# Patient Record
Sex: Male | Born: 1950 | Race: Black or African American | Hispanic: No | Marital: Married | Smoking: Former smoker
Health system: Southern US, Community
[De-identification: ages and names within clinical notes are randomized; demographics above are authoritative.]

## PROBLEM LIST (undated history)

## (undated) DIAGNOSIS — M25561 Pain in right knee: Secondary | ICD-10-CM

## (undated) DIAGNOSIS — M79674 Pain in right toe(s): Secondary | ICD-10-CM

## (undated) DIAGNOSIS — I509 Heart failure, unspecified: Secondary | ICD-10-CM

## (undated) DIAGNOSIS — I10 Essential (primary) hypertension: Secondary | ICD-10-CM

## (undated) DIAGNOSIS — Z87442 Personal history of urinary calculi: Secondary | ICD-10-CM

## (undated) DIAGNOSIS — M109 Gout, unspecified: Secondary | ICD-10-CM

## (undated) DIAGNOSIS — R5381 Other malaise: Secondary | ICD-10-CM

## (undated) DIAGNOSIS — D649 Anemia, unspecified: Secondary | ICD-10-CM

## (undated) DIAGNOSIS — E785 Hyperlipidemia, unspecified: Secondary | ICD-10-CM

## (undated) DIAGNOSIS — E669 Obesity, unspecified: Secondary | ICD-10-CM

## (undated) DIAGNOSIS — I4892 Unspecified atrial flutter: Secondary | ICD-10-CM

## (undated) DIAGNOSIS — M25562 Pain in left knee: Secondary | ICD-10-CM

## (undated) DIAGNOSIS — R011 Cardiac murmur, unspecified: Secondary | ICD-10-CM

## (undated) DIAGNOSIS — N189 Chronic kidney disease, unspecified: Secondary | ICD-10-CM

## (undated) DIAGNOSIS — Z9189 Other specified personal risk factors, not elsewhere classified: Secondary | ICD-10-CM

## (undated) DIAGNOSIS — F528 Other sexual dysfunction not due to a substance or known physiological condition: Secondary | ICD-10-CM

## (undated) DIAGNOSIS — R0789 Other chest pain: Secondary | ICD-10-CM

## (undated) DIAGNOSIS — R609 Edema, unspecified: Secondary | ICD-10-CM

## (undated) DIAGNOSIS — E875 Hyperkalemia: Secondary | ICD-10-CM

## (undated) DIAGNOSIS — R0902 Hypoxemia: Secondary | ICD-10-CM

## (undated) DIAGNOSIS — Z9989 Dependence on other enabling machines and devices: Secondary | ICD-10-CM

## (undated) DIAGNOSIS — G609 Hereditary and idiopathic neuropathy, unspecified: Secondary | ICD-10-CM

## (undated) DIAGNOSIS — I4891 Unspecified atrial fibrillation: Secondary | ICD-10-CM

## (undated) DIAGNOSIS — D219 Benign neoplasm of connective and other soft tissue, unspecified: Secondary | ICD-10-CM

## (undated) DIAGNOSIS — R5383 Other fatigue: Secondary | ICD-10-CM

## (undated) DIAGNOSIS — G4733 Obstructive sleep apnea (adult) (pediatric): Secondary | ICD-10-CM

## (undated) DIAGNOSIS — G4730 Sleep apnea, unspecified: Secondary | ICD-10-CM

## (undated) DIAGNOSIS — M1990 Unspecified osteoarthritis, unspecified site: Secondary | ICD-10-CM

## (undated) DIAGNOSIS — I8002 Phlebitis and thrombophlebitis of superficial vessels of left lower extremity: Secondary | ICD-10-CM

## (undated) DIAGNOSIS — R0602 Shortness of breath: Secondary | ICD-10-CM

## (undated) DIAGNOSIS — E119 Type 2 diabetes mellitus without complications: Secondary | ICD-10-CM

## (undated) DIAGNOSIS — R942 Abnormal results of pulmonary function studies: Secondary | ICD-10-CM

## (undated) DIAGNOSIS — R079 Chest pain, unspecified: Secondary | ICD-10-CM

## (undated) DIAGNOSIS — I359 Nonrheumatic aortic valve disorder, unspecified: Secondary | ICD-10-CM

## (undated) DIAGNOSIS — G4700 Insomnia, unspecified: Secondary | ICD-10-CM

## (undated) DIAGNOSIS — Z87898 Personal history of other specified conditions: Secondary | ICD-10-CM

## (undated) DIAGNOSIS — M26609 Unspecified temporomandibular joint disorder, unspecified side: Secondary | ICD-10-CM

## (undated) DIAGNOSIS — E291 Testicular hypofunction: Secondary | ICD-10-CM

## (undated) DIAGNOSIS — R197 Diarrhea, unspecified: Secondary | ICD-10-CM

## (undated) HISTORY — DX: Pain in right knee: M25.561

## (undated) HISTORY — DX: Unspecified atrial flutter: I48.92

## (undated) HISTORY — DX: Hereditary and idiopathic neuropathy, unspecified: G60.9

## (undated) HISTORY — DX: Obesity, unspecified: E66.9

## (undated) HISTORY — DX: Abnormal results of pulmonary function studies: R94.2

## (undated) HISTORY — DX: Other specified personal risk factors, not elsewhere classified: Z91.89

## (undated) HISTORY — DX: Other fatigue: R53.83

## (undated) HISTORY — DX: Personal history of other specified conditions: Z87.898

## (undated) HISTORY — DX: Anemia, unspecified: D64.9

## (undated) HISTORY — DX: Chest pain, unspecified: R07.9

## (undated) HISTORY — DX: Nonrheumatic aortic valve disorder, unspecified: I35.9

## (undated) HISTORY — DX: Other malaise: R53.81

## (undated) HISTORY — DX: Pain in right toe(s): M79.674

## (undated) HISTORY — DX: Testicular hypofunction: E29.1

## (undated) HISTORY — DX: Other sexual dysfunction not due to a substance or known physiological condition: F52.8

## (undated) HISTORY — PX: METATARSAL OSTEOTOMY: SHX1641

## (undated) HISTORY — DX: Unspecified atrial fibrillation: I48.91

## (undated) HISTORY — DX: Shortness of breath: R06.02

## (undated) HISTORY — DX: Insomnia, unspecified: G47.00

## (undated) HISTORY — DX: Hypoxemia: R09.02

## (undated) HISTORY — DX: Unspecified osteoarthritis, unspecified site: M19.90

## (undated) HISTORY — DX: Pain in left knee: M25.562

## (undated) HISTORY — DX: Gout, unspecified: M10.9

## (undated) HISTORY — DX: Heart failure, unspecified: I50.9

## (undated) HISTORY — DX: Benign neoplasm of connective and other soft tissue, unspecified: D21.9

## (undated) HISTORY — DX: Diarrhea, unspecified: R19.7

## (undated) HISTORY — DX: Phlebitis and thrombophlebitis of superficial vessels of left lower extremity: I80.02

## (undated) HISTORY — DX: Unspecified temporomandibular joint disorder, unspecified side: M26.609

## (undated) HISTORY — DX: Hyperkalemia: E87.5

## (undated) HISTORY — DX: Edema, unspecified: R60.9

## (undated) HISTORY — DX: Other chest pain: R07.89

## (undated) HISTORY — DX: Sleep apnea, unspecified: G47.30

## (undated) HISTORY — DX: Essential (primary) hypertension: I10

## (undated) HISTORY — DX: Chronic kidney disease, unspecified: N18.9

## (undated) HISTORY — DX: Hyperlipidemia, unspecified: E78.5

---

## 2005-02-10 ENCOUNTER — Ambulatory Visit: Payer: Self-pay | Admitting: Internal Medicine

## 2005-09-01 ENCOUNTER — Ambulatory Visit: Payer: Self-pay | Admitting: Internal Medicine

## 2005-09-15 ENCOUNTER — Ambulatory Visit: Payer: Self-pay | Admitting: Internal Medicine

## 2005-09-20 ENCOUNTER — Ambulatory Visit: Payer: Self-pay | Admitting: Internal Medicine

## 2005-11-25 ENCOUNTER — Ambulatory Visit: Payer: Self-pay | Admitting: Internal Medicine

## 2005-12-15 ENCOUNTER — Ambulatory Visit: Payer: Self-pay | Admitting: Internal Medicine

## 2005-12-30 ENCOUNTER — Encounter: Admission: RE | Admit: 2005-12-30 | Discharge: 2005-12-30 | Payer: Self-pay | Admitting: Internal Medicine

## 2007-01-25 ENCOUNTER — Encounter: Payer: Self-pay | Admitting: Internal Medicine

## 2007-01-25 DIAGNOSIS — I10 Essential (primary) hypertension: Secondary | ICD-10-CM

## 2007-01-25 DIAGNOSIS — Z9189 Other specified personal risk factors, not elsewhere classified: Secondary | ICD-10-CM

## 2007-01-25 DIAGNOSIS — E669 Obesity, unspecified: Secondary | ICD-10-CM

## 2007-01-25 DIAGNOSIS — F528 Other sexual dysfunction not due to a substance or known physiological condition: Secondary | ICD-10-CM

## 2007-01-25 HISTORY — DX: Obesity, unspecified: E66.9

## 2007-01-25 HISTORY — DX: Essential (primary) hypertension: I10

## 2007-01-25 HISTORY — DX: Other specified personal risk factors, not elsewhere classified: Z91.89

## 2007-01-25 HISTORY — DX: Other sexual dysfunction not due to a substance or known physiological condition: F52.8

## 2008-10-14 ENCOUNTER — Encounter: Payer: Self-pay | Admitting: Family Medicine

## 2008-10-21 ENCOUNTER — Emergency Department (HOSPITAL_COMMUNITY): Admission: EM | Admit: 2008-10-21 | Discharge: 2008-10-21 | Payer: Self-pay | Admitting: Family Medicine

## 2008-11-15 ENCOUNTER — Emergency Department (HOSPITAL_COMMUNITY): Admission: EM | Admit: 2008-11-15 | Discharge: 2008-11-15 | Payer: Self-pay | Admitting: Family Medicine

## 2008-11-20 ENCOUNTER — Encounter: Payer: Self-pay | Admitting: Family Medicine

## 2008-11-27 ENCOUNTER — Encounter: Payer: Self-pay | Admitting: Family Medicine

## 2009-07-10 ENCOUNTER — Encounter: Payer: Self-pay | Admitting: Family Medicine

## 2009-07-17 ENCOUNTER — Encounter: Payer: Self-pay | Admitting: Family Medicine

## 2009-10-07 ENCOUNTER — Encounter: Payer: Self-pay | Admitting: Family Medicine

## 2009-10-13 ENCOUNTER — Inpatient Hospital Stay (HOSPITAL_COMMUNITY): Admission: EM | Admit: 2009-10-13 | Discharge: 2009-10-21 | Payer: Self-pay | Admitting: Emergency Medicine

## 2009-10-13 ENCOUNTER — Encounter (INDEPENDENT_AMBULATORY_CARE_PROVIDER_SITE_OTHER): Payer: Self-pay | Admitting: Cardiovascular Disease

## 2009-10-13 ENCOUNTER — Ambulatory Visit: Payer: Self-pay | Admitting: Internal Medicine

## 2009-10-16 ENCOUNTER — Telehealth: Payer: Self-pay | Admitting: Internal Medicine

## 2009-10-16 HISTORY — PX: CARDIAC CATHETERIZATION: SHX172

## 2009-10-27 ENCOUNTER — Inpatient Hospital Stay (HOSPITAL_COMMUNITY): Admission: EM | Admit: 2009-10-27 | Discharge: 2009-10-28 | Payer: Self-pay | Admitting: Emergency Medicine

## 2009-11-11 ENCOUNTER — Ambulatory Visit: Payer: Self-pay | Admitting: Internal Medicine

## 2009-11-11 DIAGNOSIS — R071 Chest pain on breathing: Secondary | ICD-10-CM

## 2009-11-11 DIAGNOSIS — E785 Hyperlipidemia, unspecified: Secondary | ICD-10-CM | POA: Insufficient documentation

## 2009-11-11 DIAGNOSIS — R5381 Other malaise: Secondary | ICD-10-CM

## 2009-11-11 DIAGNOSIS — J189 Pneumonia, unspecified organism: Secondary | ICD-10-CM

## 2009-11-11 DIAGNOSIS — R079 Chest pain, unspecified: Secondary | ICD-10-CM

## 2009-11-11 DIAGNOSIS — R5383 Other fatigue: Secondary | ICD-10-CM

## 2009-11-11 DIAGNOSIS — R110 Nausea: Secondary | ICD-10-CM

## 2009-11-11 HISTORY — DX: Chest pain, unspecified: R07.9

## 2009-11-11 HISTORY — DX: Other malaise: R53.81

## 2009-11-13 ENCOUNTER — Encounter: Payer: Self-pay | Admitting: Family Medicine

## 2009-12-05 ENCOUNTER — Ambulatory Visit: Payer: Self-pay | Admitting: Family Medicine

## 2009-12-05 DIAGNOSIS — E669 Obesity, unspecified: Secondary | ICD-10-CM

## 2009-12-05 DIAGNOSIS — I4892 Unspecified atrial flutter: Secondary | ICD-10-CM

## 2009-12-05 DIAGNOSIS — I359 Nonrheumatic aortic valve disorder, unspecified: Secondary | ICD-10-CM | POA: Insufficient documentation

## 2009-12-05 DIAGNOSIS — I5042 Chronic combined systolic (congestive) and diastolic (congestive) heart failure: Secondary | ICD-10-CM | POA: Insufficient documentation

## 2009-12-05 DIAGNOSIS — E119 Type 2 diabetes mellitus without complications: Secondary | ICD-10-CM

## 2009-12-05 DIAGNOSIS — Z87898 Personal history of other specified conditions: Secondary | ICD-10-CM

## 2009-12-05 DIAGNOSIS — I509 Heart failure, unspecified: Secondary | ICD-10-CM

## 2009-12-05 DIAGNOSIS — N400 Enlarged prostate without lower urinary tract symptoms: Secondary | ICD-10-CM | POA: Insufficient documentation

## 2009-12-05 DIAGNOSIS — E1169 Type 2 diabetes mellitus with other specified complication: Secondary | ICD-10-CM | POA: Insufficient documentation

## 2009-12-05 HISTORY — DX: Personal history of other specified conditions: Z87.898

## 2009-12-05 HISTORY — DX: Unspecified atrial flutter: I48.92

## 2009-12-05 HISTORY — DX: Heart failure, unspecified: I50.9

## 2009-12-05 HISTORY — DX: Nonrheumatic aortic valve disorder, unspecified: I35.9

## 2009-12-05 HISTORY — DX: Type 2 diabetes mellitus without complications: E11.9

## 2009-12-19 ENCOUNTER — Ambulatory Visit: Payer: Self-pay | Admitting: Internal Medicine

## 2009-12-19 DIAGNOSIS — R0602 Shortness of breath: Secondary | ICD-10-CM

## 2009-12-19 HISTORY — DX: Shortness of breath: R06.02

## 2009-12-29 ENCOUNTER — Ambulatory Visit: Payer: Self-pay | Admitting: Family Medicine

## 2009-12-29 LAB — CONVERTED CEMR LAB
AST: 20 units/L (ref 0–37)
Albumin: 4.1 g/dL (ref 3.5–5.2)
Creatinine,U: 213.1 mg/dL
Eosinophils Absolute: 0.1 10*3/uL (ref 0.0–0.7)
HCT: 37.7 % — ABNORMAL LOW (ref 39.0–52.0)
Hemoglobin: 12.6 g/dL — ABNORMAL LOW (ref 13.0–17.0)
Hgb A1c MFr Bld: 8.8 % — ABNORMAL HIGH (ref 4.6–6.5)
Lymphocytes Relative: 50.1 % — ABNORMAL HIGH (ref 12.0–46.0)
Monocytes Absolute: 0.7 10*3/uL (ref 0.1–1.0)
Neutro Abs: 1.6 10*3/uL (ref 1.4–7.7)
Platelets: 221 10*3/uL (ref 150.0–400.0)
RDW: 17.5 % — ABNORMAL HIGH (ref 11.5–14.6)
Testosterone: 234.39 ng/dL — ABNORMAL LOW (ref 350.00–890.00)
Triglycerides: 62 mg/dL (ref 0.0–149.0)
VLDL: 12.4 mg/dL (ref 0.0–40.0)

## 2009-12-31 ENCOUNTER — Encounter: Payer: Self-pay | Admitting: Family Medicine

## 2009-12-31 ENCOUNTER — Encounter: Admission: RE | Admit: 2009-12-31 | Discharge: 2010-03-05 | Payer: Self-pay | Admitting: Family Medicine

## 2010-01-01 ENCOUNTER — Encounter: Payer: Self-pay | Admitting: Internal Medicine

## 2010-01-05 ENCOUNTER — Ambulatory Visit: Payer: Self-pay | Admitting: Family Medicine

## 2010-01-05 DIAGNOSIS — E291 Testicular hypofunction: Secondary | ICD-10-CM

## 2010-01-05 DIAGNOSIS — G609 Hereditary and idiopathic neuropathy, unspecified: Secondary | ICD-10-CM | POA: Insufficient documentation

## 2010-01-05 DIAGNOSIS — D219 Benign neoplasm of connective and other soft tissue, unspecified: Secondary | ICD-10-CM | POA: Insufficient documentation

## 2010-01-05 DIAGNOSIS — D649 Anemia, unspecified: Secondary | ICD-10-CM

## 2010-01-05 HISTORY — DX: Testicular hypofunction: E29.1

## 2010-01-05 HISTORY — DX: Benign neoplasm of connective and other soft tissue, unspecified: D21.9

## 2010-01-05 HISTORY — DX: Anemia, unspecified: D64.9

## 2010-01-05 HISTORY — DX: Hereditary and idiopathic neuropathy, unspecified: G60.9

## 2010-01-20 ENCOUNTER — Telehealth: Payer: Self-pay | Admitting: Family Medicine

## 2010-01-21 ENCOUNTER — Ambulatory Visit: Payer: Self-pay | Admitting: Internal Medicine

## 2010-02-02 ENCOUNTER — Telehealth (INDEPENDENT_AMBULATORY_CARE_PROVIDER_SITE_OTHER): Payer: Self-pay | Admitting: *Deleted

## 2010-02-02 DIAGNOSIS — R942 Abnormal results of pulmonary function studies: Secondary | ICD-10-CM

## 2010-02-02 HISTORY — DX: Abnormal results of pulmonary function studies: R94.2

## 2010-02-03 ENCOUNTER — Ambulatory Visit: Payer: Self-pay | Admitting: Family Medicine

## 2010-02-03 DIAGNOSIS — M79609 Pain in unspecified limb: Secondary | ICD-10-CM

## 2010-02-04 LAB — CONVERTED CEMR LAB
Creatinine, Ser: 1.1 mg/dL (ref 0.4–1.5)
GFR calc non Af Amer: 90.61 mL/min (ref >60)
Glucose, Bld: 84 mg/dL (ref 70–99)
Potassium: 4.6 meq/L (ref 3.5–5.1)
Sodium: 143 meq/L (ref 135–145)
Uric Acid, Serum: 6 mg/dL (ref 4.0–7.8)

## 2010-02-24 ENCOUNTER — Encounter: Payer: Self-pay | Admitting: Family Medicine

## 2010-03-12 ENCOUNTER — Ambulatory Visit: Payer: Self-pay | Admitting: Internal Medicine

## 2010-03-30 ENCOUNTER — Ambulatory Visit: Payer: Self-pay | Admitting: Internal Medicine

## 2010-03-30 ENCOUNTER — Ambulatory Visit: Payer: Self-pay | Admitting: Family Medicine

## 2010-03-31 LAB — CONVERTED CEMR LAB
ALT: 19 units/L (ref 0–53)
AST: 20 units/L (ref 0–37)
Albumin: 3.6 g/dL (ref 3.5–5.2)
Basophils Absolute: 0 10*3/uL (ref 0.0–0.1)
Bilirubin, Direct: 0.1 mg/dL (ref 0.0–0.3)
CO2: 23 meq/L (ref 19–32)
Chloride: 106 meq/L (ref 96–112)
Creatinine, Ser: 1 mg/dL (ref 0.4–1.5)
GFR calc non Af Amer: 85.74 mL/min (ref >60)
Glucose, Bld: 81 mg/dL (ref 70–99)
Hgb A1c MFr Bld: 7.3 % — ABNORMAL HIGH (ref 4.6–6.5)
LDL Cholesterol: 46 mg/dL (ref 0–99)
Lymphocytes Relative: 47.1 % — ABNORMAL HIGH (ref 12.0–46.0)
MCHC: 33.5 g/dL (ref 30.0–36.0)
Monocytes Absolute: 0.7 10*3/uL (ref 0.1–1.0)
Monocytes Relative: 16.1 % — ABNORMAL HIGH (ref 3.0–12.0)
Neutro Abs: 1.4 10*3/uL (ref 1.4–7.7)
Neutrophils Relative %: 33.9 % — ABNORMAL LOW (ref 43.0–77.0)
Potassium: 3.7 meq/L (ref 3.5–5.1)
Sodium: 139 meq/L (ref 135–145)
Total Protein: 6.6 g/dL (ref 6.0–8.3)
VLDL: 15.6 mg/dL (ref 0.0–40.0)

## 2010-04-06 ENCOUNTER — Ambulatory Visit: Payer: Self-pay | Admitting: Family Medicine

## 2010-04-06 DIAGNOSIS — G4730 Sleep apnea, unspecified: Secondary | ICD-10-CM

## 2010-04-06 DIAGNOSIS — G4733 Obstructive sleep apnea (adult) (pediatric): Secondary | ICD-10-CM

## 2010-04-06 HISTORY — DX: Obstructive sleep apnea (adult) (pediatric): G47.33

## 2010-04-07 ENCOUNTER — Telehealth (INDEPENDENT_AMBULATORY_CARE_PROVIDER_SITE_OTHER): Payer: Self-pay | Admitting: *Deleted

## 2010-04-20 ENCOUNTER — Ambulatory Visit (HOSPITAL_COMMUNITY): Admission: RE | Admit: 2010-04-20 | Discharge: 2010-04-20 | Payer: Self-pay | Admitting: Internal Medicine

## 2010-05-08 ENCOUNTER — Ambulatory Visit: Payer: Self-pay | Admitting: Pulmonary Disease

## 2010-05-08 ENCOUNTER — Telehealth: Payer: Self-pay | Admitting: Internal Medicine

## 2010-05-12 ENCOUNTER — Telehealth: Payer: Self-pay | Admitting: Internal Medicine

## 2010-05-14 ENCOUNTER — Ambulatory Visit: Payer: Self-pay | Admitting: Family Medicine

## 2010-05-15 LAB — CONVERTED CEMR LAB
OCCULT 1: NEGATIVE
OCCULT 3: NEGATIVE
OCCULT 4: NEGATIVE
OCCULT 5: NEGATIVE

## 2010-05-20 ENCOUNTER — Encounter: Payer: Self-pay | Admitting: Pulmonary Disease

## 2010-05-20 ENCOUNTER — Ambulatory Visit (HOSPITAL_BASED_OUTPATIENT_CLINIC_OR_DEPARTMENT_OTHER)
Admission: RE | Admit: 2010-05-20 | Discharge: 2010-05-20 | Payer: Self-pay | Source: Home / Self Care | Attending: Pulmonary Disease | Admitting: Pulmonary Disease

## 2010-05-25 ENCOUNTER — Encounter: Payer: Self-pay | Admitting: Internal Medicine

## 2010-05-25 ENCOUNTER — Ambulatory Visit (HOSPITAL_COMMUNITY)
Admission: RE | Admit: 2010-05-25 | Discharge: 2010-05-25 | Payer: Self-pay | Source: Home / Self Care | Attending: Internal Medicine | Admitting: Internal Medicine

## 2010-05-26 ENCOUNTER — Telehealth: Payer: Self-pay | Admitting: Internal Medicine

## 2010-05-27 ENCOUNTER — Ambulatory Visit: Payer: Self-pay | Admitting: Pulmonary Disease

## 2010-05-30 ENCOUNTER — Emergency Department (HOSPITAL_COMMUNITY)
Admission: EM | Admit: 2010-05-30 | Discharge: 2010-05-30 | Payer: Self-pay | Source: Home / Self Care | Admitting: Emergency Medicine

## 2010-06-09 ENCOUNTER — Encounter: Payer: Self-pay | Admitting: Family Medicine

## 2010-06-10 ENCOUNTER — Ambulatory Visit
Admission: RE | Admit: 2010-06-10 | Discharge: 2010-06-10 | Payer: Self-pay | Source: Home / Self Care | Attending: Pulmonary Disease | Admitting: Pulmonary Disease

## 2010-06-24 ENCOUNTER — Encounter: Payer: Self-pay | Admitting: Family Medicine

## 2010-07-02 ENCOUNTER — Encounter: Payer: Self-pay | Admitting: Pulmonary Disease

## 2010-07-02 ENCOUNTER — Telehealth (INDEPENDENT_AMBULATORY_CARE_PROVIDER_SITE_OTHER): Payer: Self-pay | Admitting: *Deleted

## 2010-07-06 ENCOUNTER — Encounter: Payer: Self-pay | Admitting: Family Medicine

## 2010-07-07 NOTE — Miscellaneous (Signed)
Summary: Orders Update pft charges  Clinical Lists Changes  Orders: Added new Service order of Carbon Monoxide diffusing w/capacity (94720) - Signed Added new Service order of Lung Volumes (94240) - Signed Added new Service order of Spirometry (Pre & Post) (94060) - Signed 

## 2010-07-07 NOTE — Letter (Signed)
Summary: Eye Exam/[facility]  Eye Exam/[facility]   Imported By: Maryln Gottron 03/11/2010 15:49:52  _____________________________________________________________________  External Attachment:    Type:   Image     Comment:   External Document

## 2010-07-07 NOTE — Letter (Signed)
Summary: CPST Network [facility] Pulmonary  [address]   [place], [place] [address]   Phone: [phone]  Fax: [phone]     Patient's Name: [name] Date of Birth: 1951/01/10 MRN: [id number]  CPST  Choose test method and choice  a)_x__Bike - recommended by ATS/ACCP. Do at [facility] at Dr. Gala Romney [facility]  b)___Treadmill - less preferred. Do at [facility] at Dr. Gala Romney lab or do at [facility] PFT lab  Choose one or more indication for test  INDICATIONS FOR CARDIOPULMONARY EXERCISE TESTING Evaluation of exercise tolerance ____x__ Determination of functional impairment or capacity (peak V? O2) __x____ Determination of exercise-limiting factors and pathophysiologic mechanisms  Evaluation of undiagnosed exercise intolerance ___x__ Assessing contribution of cardiac and pulmonary etiology in coexisting disease ___x__ Symptoms disproportionate to resting pulmonary and cardiac tests  ___x__Unexplained dyspnea when initial cardiopulmonary testing is nondiagnostic  Evaluation of patients with cardiovascular disease ____x_ Functional evaluation and prognosis in patients with heart failure _____ Selection for cardiac transplantation _____ Exercise prescription and monitoring response to exercise training for cardiac rehabilitation (special circumstances; i.e., pacemakers)  Evaluation of patients with respiratory disease _____ Functional impairment assessment (see specific clinical applications)  _____Chronic obstructive pulmonary disease Establishing exercise limitation(s) and assessing other potential contributing factors, especially occult heart disease (ischemia) ______Determination of magnitude of hypoxemia and for O2 prescription When objective determination of therapeutic intervention is necessary and not adequately addressed by standard pulmonary function testing  _____ Interstitial lung diseases _____Detection of early (occult) gas exchange abnormalities _____Overall  assessment/monitoring of pulmonary gas exchange _____Determination of magnitude of hypoxemia and for O2 prescription _____Determination of potential exercise-limiting factors _____Documentation of therapeutic response to potentially toxic therapy  ____ Pulmonary vascular disease (careful risk-benefit analysis required)  ____ Cystic fibrosis  _x__ Exercise-induced bronchospasm  Specific clinical applications ____  Preoperative evaluation _____Lung resectional surgery _____Elderly patients undergoing major abdominal surgery _____Lung volume resectional surgery for emphysema (currently investigational)  ____ Exercise evaluation and prescription for pulmonary rehabilitation  ____ Evaluation for impairment-disability  ____ Evaluation for lung, heart-lung transplantation ____ Definition of abbreviation: V? O2______ -oxygen consumption.    Kalman Shan MD    [name]

## 2010-07-07 NOTE — Assessment & Plan Note (Signed)
Summary: sleep consult / cj   Visit Type:  Initial Consult Copy to:  pcp Primary Provider/Referring Provider:  Danise Edge MD, Ramaswamy => Pulmonary, Dr. Croitoru=>Cards  CC:  Sleep consult.  History of Present Illness: 59/M for evaluation of hypersomnia, fatigue, and pt's wife witnessing apnea episodes at night.  he is undergoing dyspnea evaluation by Dr Marchelle Gearing & a CPST has been scheduled. He has atrial fibrillation & non ischemic cardiomyopathy with EF 40%. He has gained 40 lbs, all his prbs started since since may'11 wife witnessed apneas 4 wks ago, mild snoring  sleeps on side x 2 pillows awakens 4A, forces himself back to bed, may doze off worked 3 rd shift x 6 yrs, last worked 3-10p He reports sleep x 4-5 hrs only , Temazepam prescribed, uses once/ week, 15 mg not helping much  Drinks 1 cup coffee/d  Epworth Sleepiness Score 1 There is no history suggestive of cataplexy, sleep paralysis or parasomnias   Preventive Screening-Counseling & Management  Alcohol-Tobacco     Alcohol drinks/day: 0     Smoking Status: quit > 6 months     Year Started: 1975     Year Quit: 1983     Pack years: 4   History of Present Illness: unable to sleep more than three hours a night  What time do you typically go to bed?(between what hours): 11:00pm and 12am  How long does it take you to fall asleep? half hour  How many times during the night do you wake up? 2 or 3 times  What time do you get out of bed to start your day? 4 or 5 am  Do you drive or operate heavy machinery in your occupation? yes  How much has your weight changed (up or down) over the past two years? (in pounds): 20 lbs+  Have you ever had a sleep study before?  If yes,when and where: no  Do you currently use CPAP ? If so , at what pressure? no  Do you wear oxygen at any time? If yes, how many liters per minute? no Allergies (verified): 1)  ! Sulfa  Past Pulmonary History:  Pulmonary History: admission  10/13/2009- 10/21/2009  1. Atrial fibrillation with rapid ventricular response, right middle       lobe pneumonia, possible source.   2. Chest pain secondary to pneumonia.   3. Moderate hypoxic respiratory failure secondary to pneumonia and       heart failure.   4. Type 2 diabetes.   5. Hypertension.   6. Status post cardiac catheterization on Oct 16, 2009.   7. Nonischemic cardiomyopathy.      PROCEDURES:   1. Cardiac catheterization on Oct 16, 2009 for chest pain, congestive       heart failure, new-onset atrial flutter, shows an EF of 40-45%,       nonischemic cardiomyopathy with mildly depressed left ventricular       systolic function but severe diastolic dysfunction with elevated       filling pressures, possible tachycardia related cardiomyopathy       related to atrial flutter versus acute parapneumonic myocarditis,       stress-induced cardiomyopathy.   2. Status post synchronized direct cardioversion on Oct 13, 2009.   3. Chest x-ray on May 9th shows right middle lobe pneumonia.  Chest x-       ray on the 10th shows increase in right lung base, airspace       disease, cardiomegaly with pulmonary  vascular congestion.   4. CT angio on the 14th shows right-sided airspace disease consistent       with infection, small right-sided pleural effusion, cardiomegaly,       enlargement of the pulmonary outflow track suggestive of pulmonary       arterial hypertension.  Last chest x-ray of May 15th shows right       middle lobe pneumonia.      Pulmonary History-continued: DISCHARGE DIAGNOSES: 5/22/20211 - 10/28/2009  1. Hypoglycemia in the setting of diabetes secondary to Amaryl plus       metformin.   2. Hypertension.   3. Dyslipidemia.   4. Atrial fibrillation on amiodarone, digoxin, diltiazem and Coumadin.   5. Diarrhea. C. diff negative, resolved.   6. Microcytic anemia, needing outpatient followup.   7. Chronic systolic heart failure.   8. Nonischemic cardiomyopathy, ejection  fraction last one was 35%.   Family History: Reviewed history from 12/05/2009 and no changes required. ? Lung CA- Father  Clotting disorder- Mother Heart dz- Mother Father: deceased @ 19, cancer, lung disease, smoker Mother: deceased @ 25, heart disease s/p MI, HTN, DM, hyperlipidemia, strokes, snuff Siblings:  Sister: 65, DM, HTN, smoker Sister: 3, brain aneurysm, clipped, seizure d/o w/ aneurysm, smoker MGM: deceased @ late 66s , Leukemia? MGF: unknown hx died older PGM: unknown hx QIH:[id number] hx Children: Son: 70, A&W Son: 76,   Social History: Reviewed history from 11/11/2009 and no changes required. 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  Quit in 1982.  Smoked approx 10 yrs up to 1/21 ppd. No ETOH  Review of Systems       The patient complains of shortness of breath with activity, shortness of breath at rest, chest pain, irregular heartbeats, and weight change.  The patient denies productive cough, non-productive cough, coughing up blood, acid heartburn, indigestion, loss of appetite, abdominal pain, difficulty swallowing, sore throat, tooth/dental problems, headaches, nasal congestion/difficulty breathing through nose, sneezing, itching, ear ache, anxiety, depression, hand/feet swelling, joint stiffness or pain, rash, change in color of mucus, and fever.    Vital Signs:  Patient profile:   60 year old male Height:      70 inches Weight:      236.6 pounds O2 Sat:      99 % on Room air Temp:     98.8 degrees F oral Pulse rate:   88 / minute BP sitting:   130 / 80  (left arm) Cuff size:   large  Vitals Entered By: Zackery Barefoot CMA (May 08, 2010 3:43 PM)  O2 Flow:  Room air  Physical Exam  Additional Exam:  Gen. Pleasant, well-nourished, in no distress, normal affect ENT - no lesions, no post nasal drip, class 2 airway Neck: No JVD, no thyromegaly, no carotid bruits Lungs: no use of accessory muscles, no dullness to  percussion, clear without rales or rhonchi  Cardiovascular: Rhythm regular, heart sounds  normal, no murmurs or gallops, no peripheral edema Abdomen: soft and non-tender, no hepatosplenomegaly, BS normal. Musculoskeletal: No deformities, no cyanosis or clubbing Neuro:  alert, non focal     Impression & Recommendations:  Problem # 1:  SLEEP APNEA (ICD-780.57) The pathophysiology of obstructive sleep apnea, it's cardiovascular consequences and modes of treatment including CPAP were discussed with the patient in great detail.  WItnessed apneas are a concern. Although he is not 'sleepy' , AHI > 30 given h/o atrial fibrillation & cardiomyopathy would call for treatment His main complaint  seems to be insomnia not relieved by 15 mg temazepam. I would be hesitant to prescribe more benzodiazepines without knowing hte degree of sleep disordered brathing. Orders: Sleep Disorder Referral (Sleep Disorder) Consultation Level III (23557)  Patient Instructions: 1)  Copy sent to: Dr Royann Shivers, Dr Rogelia Rohrer 2)  Please schedule a follow-up appointment in 2 weeks after sleep study 3)  OK to take temazepam during study

## 2010-07-07 NOTE — Letter (Signed)
Summary: CPST- R/O Contraindications  [facility]  [address]   [place], [place] [address]   Phone: [phone]  Fax: [phone]    Patient's Name: [name] Date of Birth: 01-28-1951 MRN: [id number]  *********Rule out Contraindications**************** Absolute                                                                                                                           ___ Acute MI (3-5 Days)                                 ___ Unstable Angina                                          ___ Uncontrolled arrhythmias causing symptoms or hemodynamic compromise. ___ Syncope                                                     ___ Active endocarditis                                         ___ Acute Myocarditis/Pericarditis                        ___ Symptomatic severe aortic stenosis  ___ Acute Pulmonary embolus or pulmonary infarction                ___ Uncontrolled Heart Failure  ___ Thrombosis of lower extremitie ___ Suspected dissecting aneurysm ___ Uncontrolled Asthma                          ___ Pulmonary Edema                                        ___ RA desat @ rest<85%                                      ___ Repiratory Failure                                         ___ Acute noncardiopulmonary disorder that may affect exercise performance or be         aggravated by exercise (ie infection, renal failure,  thyrotoxicosis) .                               ___ Mental impairment leading to inabliity to cooperate   Relative ___ Left main coronary stenosis or its equivalent ___ Moderate stentoic valvular heart disease ___ Severe untreated arterial hypertension @ rest (<200 mmHg             systolic,>177mmHg Diastolic ___ Tachy/Brady Arrhythmias ___ High- degree artioventricular block ___ Hypertrophic cardiomyopathy ___ Significant pulmonary hypertension ___ Advanced or complicated pregnancy ___ Electrolyte abnormalities ___ Orthopedic impairment  that compromises exercise performance  knonw nicm 35%. Was on amio. Very dyspnic. No overt contraindications  Kalman Shan MD    [facility]

## 2010-07-07 NOTE — Progress Notes (Signed)
Summary: results---[id number]  Phone Note Call from Patient Call back at Home Phone [phone]   Caller: Patient Call For: ramaswamy Reason for Call: Talk to Nurse, Talk to Doctor, Lab or Test Results Summary of Call: pt would like results of PFT's. Initial call taken by: Eugene Gavia,  February 02, 2010 12:01 PM  Follow-up for Phone Call        Pls advise results of PFT's done on 01/22/10, thanks! Follow-up by: Vernie Murders,  February 02, 2010 12:02 PM  Additional Follow-up for Phone Call Additional follow up Details #1::         PFTs 01/21/2010 show restriction wiht low DLCO. REcommended CT chest wihtout contrast. Lot of financial issues. So, he will have it end sept/early oct 2011 to fu on CT without contrast (pls check if i wrote order correct) from when he was in hospital.  He will alsocome to pick up a copy of PFT - pls give him one whenhe comes  Pls confirm with him if he wants to see me after CT chest or if he just wnats to go over it over phone. [address] is fine with me Additional Follow-up by: Kalman Shan MD,  February 02, 2010 6:13 PM  New Problems: PULMONARY FUNCTION TESTS, ABNORMAL (ICD-794.2)   Additional Follow-up for Phone Call Additional follow up Details #2::    [id number] need to see if  pt wants to see MR after CT to discuss results or if he would like MR to call him with CT resutls.  Also, pt's PFTs have not been scanned into pt's chart yet so unable to get him a copy of it yet. Arman Filter LPN  February 03, 2010 10:10 AM   called and spoke with pt.  pt states he would like to be seen in the office following his CT scan to discuss results in person.  therefore, i instructed pt to call our office to schedule OV with MR once he knew the date of his CT scan.  Pt verbalized understanding.  Pt will also pick up copy of PFT results tomorrow, which I left at the front desk for pt to get.  Aundra Millet Reynolds LPN  February 03, 2010 10:36 AM   New Problems: PULMONARY  FUNCTION TESTS, ABNORMAL (ICD-794.2)

## 2010-07-07 NOTE — Assessment & Plan Note (Signed)
Summary: hfu for pna//jrc   Visit Type:  Hospital Follow-up Primary Provider/Referring Provider:  Dr. Nicholos Johns - PMD, Dr Marchelle Gearing -= Pulmonary, Dr Bronson Curb - Cards  CC:  Post hospital followup.  Pt states that he gets tired very easily and gets out breath walking short distances.  He states that breathing worsens when he climbs stairs.  He c/o still feeling very fatigued since discharge.  He also c/o still having pain under right arm.[name]  History of Present Illness: OV 11/11/2009: 60 year old male admitted in May 2011 for RML pneumona but also simultatneosuly had problems with NICM - ef 35%, and A Fib RVR needing cardioversion and amiodarone (See past medhx). After discharte also had readmission for C Diff. HE is following at office today for pneumonia. Since discharge does not feel back to baseline. Still very dyspneic with minimal exertion. Also having Rt lateral chest pain that is constant and ismoderate in severity and worsened with raising arm. No improvement  in pain. Has associated fatigue and nausea and vomit. Has see Dr. Jomarie [name] last week; repeat echo is pending.   Current Medications (verified): 1)  Lisinopril 20 Mg Tabs (Lisinopril) .[name].. 1 Once Daily 2)  Crestor 20 Mg Tabs (Rosuvastatin Calcium) .[name].. 1 Once Daily 3)  Amiodarone Hcl 200 Mg Tabs (Amiodarone Hcl) .[name].. 1 Once Daily 4)  Combivent 18-103 Mcg/act Aero (Ipratropium-Albuterol) .... 2 Puffs Every 4 Hours As Needed 5)  Digoxin 0.25 Mg Tabs (Digoxin) .... 1/2 Once Daily 6)  Furosemide 40 Mg Tabs (Furosemide) .[name].. 1 Once Daily and 1 Extra If Wt Is Over 196 7)  Metformin Hcl 500 Mg Tabs (Metformin Hcl) .[name].. 1 Four Times A Day 8)  Prilosec 40 Mg Cpdr (Omeprazole) .[name].. 1 Once Daily 9)  Temazepam 15 Mg Caps (Temazepam) .[name].. 1 At Bedtime As Needed 10)  Coumadin 5 Mg Tabs (Warfarin Sodium) .... Per Anticoagulation Clinic  Allergies (verified): 1)  ! Sulfa  Past History:  Past Medical History: Last updated:  01/25/2007 Hypertension  Family History: Last updated: 11/11/2009 ? Lung CA- Father  Clotting disorder- Mother Heart dz- Mother  Social History: Last updated: 11/11/2009 Married Children Disabled Former smoker.  Quit in 1982.  Smoked approx 10 yrs up to 1/21 ppd. No ETOH  Risk Factors: Smoking Status: never (01/25/2007)  Past Surgical History: Reviewed history from 01/25/2007 and no changes required. Bilat. foot surgery  Past Pulmonary History:  Pulmonary History: admission 10/13/2009- 10/21/2009  1. Atrial fibrillation with rapid ventricular response, right middle       lobe pneumonia, possible source.   2. Chest pain secondary to pneumonia.   3. Moderate hypoxic respiratory failure secondary to pneumonia and       heart failure.   4. Type 2 diabetes.   5. Hypertension.   6. Status post cardiac catheterization on Oct 16, 2009.   7. Nonischemic cardiomyopathy.      PROCEDURES:   1. Cardiac catheterization on Oct 16, 2009 for chest pain, congestive       heart failure, new-onset atrial flutter, shows an EF of 40-45%,       nonischemic cardiomyopathy with mildly depressed left ventricular       systolic function but severe diastolic dysfunction with elevated       filling pressures, possible tachycardia related cardiomyopathy       related to atrial flutter versus acute parapneumonic myocarditis,       stress-induced cardiomyopathy.   2. Status post synchronized direct cardioversion on Oct 13, 2009.  3. Chest x-ray on May 9th shows right middle lobe pneumonia.  Chest x-       ray on the 10th shows increase in right lung base, airspace       disease, cardiomegaly with pulmonary vascular congestion.   4. CT angio on the 14th shows right-sided airspace disease consistent       with infection, small right-sided pleural effusion, cardiomegaly,       enlargement of the pulmonary outflow track suggestive of pulmonary       arterial hypertension.  Last chest x-ray of May 15th  shows right       middle lobe pneumonia.      Pulmonary History-continued: DISCHARGE DIAGNOSES: 5/22/20211 - 10/28/2009  1. Hypoglycemia in the setting of diabetes secondary to Amaryl plus       metformin.   2. Hypertension.   3. Dyslipidemia.   4. Atrial fibrillation on amiodarone, digoxin, diltiazem and Coumadin.   5. Diarrhea. C. diff negative, resolved.   6. Microcytic anemia, needing outpatient followup.   7. Chronic systolic heart failure.   8. Nonischemic cardiomyopathy, ejection fraction last one was 35%.   Family History: Reviewed history and no changes required. ? Lung CA- Father  Clotting disorder- Mother Heart dz- Mother  Social History: Reviewed history and no changes required. Married Children Disabled Former smoker.  Quit in 1982.  Smoked approx 10 yrs up to 1/21 ppd. No ETOH  Review of Systems       The patient complains of shortness of breath with activity, shortness of breath at rest, chest pain, loss of appetite, abdominal pain, anxiety, depression, and rash.  The patient denies productive cough, non-productive cough, coughing up blood, irregular heartbeats, acid heartburn, indigestion, weight change, difficulty swallowing, sore throat, tooth/dental problems, headaches, nasal congestion/difficulty breathing through nose, sneezing, itching, ear ache, hand/feet swelling, joint stiffness or pain, change in color of mucus, and fever.    Vital Signs:  Patient profile:   60 year old male Height:      70 inches Weight:      208 pounds BMI:     29.95 O2 Sat:      90 % on Room air Temp:     98.0 degrees F oral Pulse rate:   90 / minute BP sitting:   150 / 80  (left arm)  Vitals Entered By: Vernie Murders (November 11, 2009 4:01 PM)  O2 Flow:  Room air  Physical Exam  General:  well developed, well nourished, in no acute distress Head:  normocephalic and atraumatic Eyes:  PERRLA/EOM intact; conjunctiva and sclera clear Ears:  TMs intact and clear with normal  canals Nose:  no deformity, discharge, inflammation, or lesions Mouth:  no deformity or lesions Neck:  JVD.  + Chest Wall:  no deformities noted Lungs:  clear bilaterally to auscultation and percussion Heart:  regular rate and rhythm, S1, S2 without murmurs, rubs, gallops, or clicks Abdomen:  bowel sounds positive; abdomen soft and non-tender without masses, or organomegaly  hepatojugular reflex + Msk:  no deformity or scoliosis noted with normal posture Pulses:  pulses normal Extremities:  no clubbing, cyanosis, edema, or deformity noted Neurologic:  CN II-XII grossly intact with normal reflexes, coordination, muscle strength and tone Skin:  intact without lesions or rashes Cervical Nodes:  no significant adenopathy Axillary Nodes:  no significant adenopathy Psych:  alert and cooperative; normal mood and affect; normal attention span and concentration   CXR  Procedure date:  11/11/2009  Findings:  Improving RML infiltrate  Comments:      personally reviewed  Impression & Recommendations:  Problem # 1:  PNEUMONIA (ICD-486) Assessment Improved CXR shows clearance partially  plan repeat cxr in 4 weeks Orders: T-2 View CXR (71020TC) Est. Patient Level IV (03474)  Problem # 2:  FATIGUE, CHRONIC (ICD-780.79) Assessment: New  ? due to chf, deconditioning. Discussed with Dr. Jomarie [name] over phone who agrees. HE will see for followup. If cards thinks no chf, have recommended rehab referral.  Orders: Est. Patient Level IV (25956)  Problem # 3:  NAUSEA (ICD-787.02) Assessment: New  Unclear cause. Spoke to Dr. Jomarie [name]. He thinks it could be the amiodarone. He will callpatient and advise  Orders: Est. Patient Level IV (38756)  Problem # 4:  CHEST PAIN-PAINFUL RESPIRATION (ICD-786.52) Assessment: Unchanged  I think this pleuritic pain is due the RML infiltrate. THe iniflrate has improved. Will monitor clinically for now. No need for PE workup becuase he is on  coumadin anyways.   Orders: Est. Patient Level IV (43329)  Medications Added to Medication List This Visit: 1)  Lisinopril 20 Mg Tabs (Lisinopril) .[name].. 1 once daily 2)  Crestor 20 Mg Tabs (Rosuvastatin calcium) .[name].. 1 once daily 3)  Amiodarone Hcl 200 Mg Tabs (Amiodarone hcl) .[name].. 1 once daily 4)  Combivent 18-103 Mcg/act Aero (Ipratropium-albuterol) .... 2 puffs every 4 hours as needed 5)  Digoxin 0.25 Mg Tabs (Digoxin) .... 1/2 once daily 6)  Furosemide 40 Mg Tabs (Furosemide) .[name].. 1 once daily and 1 extra if wt is over 196 7)  Metformin Hcl 500 Mg Tabs (Metformin hcl) .[name].. 1 four times a day 8)  Prilosec 40 Mg Cpdr (Omeprazole) .[name].. 1 once daily 9)  Temazepam 15 Mg Caps (Temazepam) .[name].. 1 at bedtime as needed 10)  Coumadin 5 Mg Tabs (Warfarin sodium) .... Per anticoagulation clinic  Patient Instructions: 1)  you pneumonia is clearing up 2)  return to see me in 1 month with followup CXR 3)  I will talk to Dr. Sabino Niemann today and see if ok for you to attend rehab 4)  I will also express to hiiim about your nausea, vomitting   Appended Document: hfu for pna//jrc Ambulatory Pulse Oximetry  Resting; HR__91___    02 Sat__99%ra___  Lap1 (185 feet)   HR_103___   02 Sat__100%ra___ Lap2 (185 feet)   HR__97___   02 Sat__100%ra___    Lap3 (185 feet)   HR_____   02 Sat_____  _x__Test Completed without Difficulty ___Test Stopped due to:

## 2010-07-07 NOTE — Letter (Signed)
Summary: [facility]   Imported By: Sherian Rein 02/11/2010 07:59:46  _____________________________________________________________________  External Attachment:    Type:   Image     Comment:   External Document

## 2010-07-07 NOTE — Letter (Signed)
Summary: [facility]   Imported By: Sherian Rein 02/11/2010 07:58:59  _____________________________________________________________________  External Attachment:    Type:   Image     Comment:   External Document

## 2010-07-07 NOTE — Letter (Signed)
SummaryScience writer Pulmonary Care Appointment Letter  [facility]  [address]   [place], [place] [address]   Phone: [phone]  Fax: [phone]    01/01/2010 MRN: [id number]  YARED BAREFOOT [address] [place], [place]  [address]  Dear Mr. Eltzroth,   Our office is attempting to contact you about an appointment.  Please call our office at [phone] to schedule this appointment for breathing test.  Our registration staff is prepared to assist you with any questions you may have.    Thank you,   [facility]   Appended Document: [facility] Appointment Letter letter mailed. Pt needs to have PFT done per MR request. jwr  Appended Document: [facility] Appointment Letter letter discarded, made contact with pt via telephone and appt Aug 4 for PFt. jwr

## 2010-07-07 NOTE — Assessment & Plan Note (Signed)
Summary: gout in rt big toe and ball of foot/cjr   Vital Signs:  Patient profile:   60 year old male Height:      70 inches (177.80 cm) Weight:      225.31 pounds (102.41 kg) O2 Sat:      99 % on Room air Temp:     98.3 degrees F (36.83 degrees C) oral Pulse rate:   86 / minute BP sitting:   148 / 88  (left arm) Cuff size:   regular  Vitals Entered By: Josph Macho RMA (February 03, 2010 9:08 AM)  O2 Flow:  Room air  Serial Vital Signs/Assessments:  Time      Position  BP       Pulse  Resp  Temp     By                     138/78                         Danise Edge MD  CC: Gout in left toe and ball of foot X5 days/ CF Is Patient Diabetic? Yes   History of Present Illness: Patient in today with a history of gout.This flare began about 5 days ago. He denies any trauma. Is reporting the pain is actually somewhat better today than yesterday due to the hi doses of Ibuprofen in the past 24 hours. The left great toe MTP joint has been red, swollen and warm. He has not had a flare in many years. He denies any recent changes in diet/recent illness or dehydration. He does note he continues to struggle with episodes of SOB, has recently undergone PFTs and is following with pulmonology and has an appt with cardiology in the near future. Not complaining of CP/f/c/congestion. HE has an Albuterol inhaler and he did try it during one of these episodes and he did get some relief from that.  Current Problems (verified): 1)  Pain in Soft Tissues of Limb  (ICD-729.5) 2)  Unspecified Anemia  (ICD-285.9) 3)  Testicular Hypofunction  (ICD-257.2) 4)  Peripheral Neuropathy  (ICD-356.9) 5)  Neuroma  (ICD-215.9) 6)  Dyspnea  (ICD-786.05) 7)  Atrial Flutter  (ICD-427.32) 8)  Benign Prostatic Hypertrophy, Hx of  (ICD-V13.8) 9)  Dm  (ICD-250.00) 10)  Aortic Stenosis, Moderate  (ICD-424.1) 11)  CHF  (ICD-428.0) 12)  Chest Pain-painful Respiration  (ICD-786.52) 13)  Chest Pain-unspecified   (ICD-786.50) 14)  Nausea  (ICD-787.02) 15)  Fatigue, Chronic  (ICD-780.79) 16)  Dyslipidemia  (ICD-272.4) 17)  Pneumonia  (ICD-486) 18)  Obesity Nos  (ICD-278.00) 19)  Erectile Dysfunction  (ICD-302.72) 20)  Insomnia, Hx of  (ICD-V15.89) 21)  Hypertension  (ICD-401.9)  Current Medications (verified): 1)  Lisinopril 20 Mg Tabs (Lisinopril) .[name].. 1 Tab By Mouth Bid 2)  Crestor 20 Mg Tabs (Rosuvastatin Calcium) .[name].. 1 Once Daily 3)  Combivent 18-103 Mcg/act Aero (Ipratropium-Albuterol) .... 2 Puffs Every 4 Hours As Needed 4)  Digoxin 0.25 Mg Tabs (Digoxin) .... 1/2 Once Daily 5)  Furosemide 40 Mg Tabs (Furosemide) .[name].. 1 Once Daily and 1 Extra If Wt Is Over 196 6)  Metformin Hcl 1000 Mg Tabs (Metformin Hcl) .[name].. 1 Tablet Two Times A Day 7)  Prilosec 40 Mg Cpdr (Omeprazole) .[name].. 1 Once Daily 8)  Temazepam 15 Mg Caps (Temazepam) .[name].. 1 At Bedtime As Needed 9)  Coumadin 5 Mg Tabs (Warfarin Sodium) .... Per Anticoagulation Clinic 10)  Potassium Chloride  Crys Cr 20 Meq Cr-Tabs (Potassium Chloride Crys Cr) .[name].. 1 Tab By Mouth 11)  Diltiazem Hcl Er Beads 240 Mg Xr24h-Cap (Diltiazem Hcl Er Beads) .[name].. 1 Tablet By Mouth 12)  Diabeta 5 Mg Tabs (Glyburide) .[name].. 1 Tab By Mouth Qd 13)  Advil .... 2 Every 12 Hrs  Allergies (verified): 1)  ! Sulfa  Past History:  Past medical history reviewed for relevance to current acute and chronic problems. Social history (including risk factors) reviewed for relevance to current acute and chronic problems.  Past Medical History: Reviewed history from 12/19/2009 and no changes required. Hypertension EMG/NCS 01/17/2006 .Marland KitchenMarland KitchenDr Anne Hahn > Suuggestive of diabetic neuropathy #Cough April 2007 > CT chest 09/20/2005 - No hilar or lung mass (prominent hilum on CXR was due to pulmonary vessels) > was given Singulair by Dr [name] in 2004. No record of PFTs  Social History: Reviewed history from 11/11/2009 and no changes required. Married Children Disabled Former  smoker.  Quit in 1982.  Smoked approx 10 yrs up to 1/21 ppd. No ETOH  Review of Systems      See HPI  Physical Exam  General:  Well-developed,well-nourished,in no acute distress; alert,appropriate and cooperative throughout examination Head:  Normocephalic and atraumatic without obvious abnormalities. No apparent alopecia or balding. Mouth:  Oral mucosa and oropharynx without lesions or exudates.  Teeth in good repair. Neck:  No deformities, masses, or tenderness noted. Lungs:  Normal respiratory effort, chest expands symmetrically. Lungs are clear to auscultation, no crackles or wheezes. Heart:  Normal rate and regular rhythm. S1 and S2 normal without gallop, click, rub or other extra sounds.grade  2/6 systolic murmur.   Abdomen:  Bowel sounds positive,abdomen soft and non-tender without masses, organomegaly or hernias noted. Pulses:  R and L ,dorsalis pedis and posterior tibial pulses are full and equal bilaterally Extremities:  slightly swelling at 1st MTP joint on left foot. No erythema or warmth, FROM in toes Psych:  Cognition and judgment appear intact. Alert and cooperative with normal attention span and concentration. No apparent delusions, illusions, hallucinations   Impression & Recommendations:  Problem # 1:  PAIN IN SOFT TISSUES OF LIMB (ICD-729.5)  Orders: TLB-BMP (Basic Metabolic Panel-BMET) (80048-METABOL) TLB-Uric Acid, Blood (84550-URIC) Venipuncture (02725) Specimen Handling (36644) Likely gouty flare, given rx for Colchicine to use today, max of 6 tabs, if pain perists given meloxicam to try as needed, maintain adequate hydration  Problem # 2:  HYPERTENSION (ICD-401.9)  His updated medication list for this problem includes:    Lisinopril 20 Mg Tabs (Lisinopril) .[name]... 1 tab by mouth bid    Furosemide 40 Mg Tabs (Furosemide) .[name]... 1 once daily and 1 extra if wt is over 196    Diltiazem Hcl Er Beads 240 Mg Xr24h-cap (Diltiazem hcl er beads) .[name]... 1 tablet by  mouth well controlled, no change in meds today  Problem # 3:  DYSPNEA (ICD-786.05) Encouraged him to keep Albuterol with him and proceed with cardiology and pulmonary evaluation, report worsening symptoms  Problem # 4:  CHEST PAIN-UNSPECIFIED (ICD-786.50) Improved but with multiple cardiac concerns and SOB will proceed with cardiac consultation in September  Complete Medication List: 1)  Lisinopril 20 Mg Tabs (Lisinopril) .[name].. 1 tab by mouth bid 2)  Crestor 20 Mg Tabs (Rosuvastatin calcium) .[name].. 1 once daily 3)  Combivent 18-103 Mcg/act Aero (Ipratropium-albuterol) .... 2 puffs every 4 hours as needed 4)  Digoxin 0.25 Mg Tabs (Digoxin) .... 1/2 once daily 5)  Furosemide 40 Mg Tabs (Furosemide) .Marland KitchenMarland KitchenMarland Kitchen 1  once daily and 1 extra if wt is over 196 6)  Metformin Hcl 1000 Mg Tabs (Metformin hcl) .[name].. 1 tablet two times a day 7)  Prilosec 40 Mg Cpdr (Omeprazole) .[name].. 1 once daily 8)  Temazepam 15 Mg Caps (Temazepam) .[name].. 1 at bedtime as needed 9)  Coumadin 5 Mg Tabs (Warfarin sodium) .... Per anticoagulation clinic 10)  Potassium Chloride Crys Cr 20 Meq Cr-tabs (Potassium chloride crys cr) .[name].. 1 tab by mouth 11)  Diltiazem Hcl Er Beads 240 Mg Xr24h-cap (Diltiazem hcl er beads) .[name].. 1 tablet by mouth 12)  Diabeta 5 Mg Tabs (Glyburide) .[name].. 1 tab by mouth qd 13)  Advil  .... 2 every 12 hrs 14)  Meloxicam 7.5 Mg Tabs (Meloxicam) .[name].. 1 tab by mouth two times a day as needed pain with food 15)  Colcrys 0.6 Mg Tabs (Colchicine) .[name].. 1 tab by mouth q 2 hours as needed pain to a max of 6 tabs/24hr, pain relief or diarrhea  Patient Instructions: 1)  Please schedule a follow-up appointment as needed if pain worsens or does not improve.  2)  Maintain good hydration, avoid caffeine and minimize sodium 3)  Colchicine as directed for next 24 hours and Meloxicam after that. Prescriptions: COLCRYS 0.6 MG TABS (COLCHICINE) 1 tab by mouth q 2 hours as needed pain to a max of 6 tabs/24hr, pain relief or diarrhea   #6 x 1   Entered and Authorized by:   Danise Edge MD   Signed by:   Danise Edge MD on 02/03/2010   Method used:   Electronically to        [facility] [address] (retail)       [address], [address]       [place], [place]  [address]       Ph: [phone]       Fax: [phone]   RxID:   [id number] MELOXICAM 7.5 MG TABS (MELOXICAM) 1 tab by mouth two times a day as needed pain with food  #60 x 1   Entered and Authorized by:   Danise Edge MD   Signed by:   Danise Edge MD on 02/03/2010   Method used:   Electronically to        [facility] [address] (retail)       [address], [address]       [place], [place]  [address]       Ph: [phone]       Fax: [phone]   RxID:   [id number]

## 2010-07-07 NOTE — Assessment & Plan Note (Signed)
Summary: new to est///ccm   Vital Signs:  Patient profile:   60 year old male Height:      70 inches (177.80 cm) Weight:      217 pounds (98.64 kg) O2 Sat:      93 % on Room air Temp:     98.6 degrees F (37.00 degrees C) oral Pulse rate:   84 / minute BP sitting:   148 / 92  (right arm) Cuff size:   large  Vitals Entered By: Josph Macho RMA (December 05, 2009 10:33 AM)  O2 Flow:  Room air CC: Est new pt/  CF Is Patient Diabetic? Yes   History of Present Illness: Patient in for new patient appt.  Has been very ill the past couple of months and would like to establish ongoing care to manage his new health concerns.  In early May he became very ill, tachycardic, SOB, fatigue, cough and ultimately went to [facility] on 10/12/2009. Upon arrival he reports a pulse rate of around 170, review of his cardiology note confirms atrial flutter w/RVR. He ended up in the ICU and hospitalized til 5/17. He was converted to sinus rhythm and had been maintained on Amiodorone until yesterday when his Cardiologist, Dr Thurmon Fair, stopped this and put him back on Cardiazem CD which he has taken previously. He was also noted to have a very bad b/l pneumonia and was treated with antibiotics for that. He has since had a repeat CXR performed by Pulmonology which he reports showed resolution on left and improvement on right. He has follow up again scheduled with both docotrs in near future. After discharge from the hospital he developed diarrhea and hypoglycemia and went back in from 5/21 to 5/24. They stopped his once daily Glimeperide 4mg  and now he is running hi in the 250 to 300 range. He has been off work since 5/7 and does not feel strong enough to return yet. No present CP/palp/f/c/GI/GU c/o. He has been very inactive since his hospital stay. Very minimal cough/SOB or edema. Using his Combivent very infrequently Before this episode he was already a known diabetic w/ HTN and hi cholesterol.   Preventive  Screening-Counseling & Management  Alcohol-Tobacco     Alcohol drinks/day: 0     Smoking Status: quit > 6 months     Year Started: 1975     Year Quit: 1983     Pack years: 4  Caffeine-Diet-Exercise     Diet Comments: Higher education careers adviser     Does Patient Exercise: no  Safety-Violence-Falls     Risk analyst Use: yes     Smoke Detectors: yes      Sexual History:  currently monogamous.        Drug Use:  never.    Current Medications (verified): 1)  Lisinopril 20 Mg Tabs (Lisinopril) .[name].. 1 Once Daily 2)  Crestor 20 Mg Tabs (Rosuvastatin Calcium) .[name].. 1 Once Daily 3)  Combivent 18-103 Mcg/act Aero (Ipratropium-Albuterol) .... 2 Puffs Every 4 Hours As Needed 4)  Digoxin 0.25 Mg Tabs (Digoxin) .... 1/2 Once Daily 5)  Furosemide 40 Mg Tabs (Furosemide) .[name].. 1 Once Daily and 1 Extra If Wt Is Over 196 6)  Metformin Hcl 500 Mg Tabs (Metformin Hcl) .[name].. 1 Four Times A Day 7)  Prilosec 40 Mg Cpdr (Omeprazole) .[name].. 1 Once Daily 8)  Temazepam 15 Mg Caps (Temazepam) .[name].. 1 At Bedtime As Needed 9)  Coumadin 5 Mg Tabs (Warfarin Sodium) .... Per Anticoagulation Clinic 10)  Potassium Chloride Crys Cr 20 Meq Cr-Tabs (Potassium Chloride Crys Cr) .[name].. 1 Tab By Mouth 11)  Diltiazem Hcl Er Beads 240 Mg Xr24h-Cap (Diltiazem Hcl Er Beads) .[name].. 1 Tablet By Mouth  Allergies (verified): 1)  ! Sulfa  Past History:  Past Surgical History: Bilat. foot surgery removed part of 5th metatarsal to correct curvature of  toes  Family History: ? Lung CA- Father  Clotting disorder- Mother Heart dz- Mother Father: deceased @ 34, cancer, lung disease, smoker Mother: deceased @ 19, heart disease s/p MI, HTN, DM, hyperlipidemia, strokes, snuff Siblings:  Sister: 15, DM, HTN, smoker Sister: 87, brain aneurysm, clipped, seizure d/o w/ aneurysm, smoker MGM: deceased @ late 25s , Leukemia? MGF: unknown hx died older PGM: unknown hx EAV:[id number] hx Children: Son: 36, A&W Son: 29,   Social History: Smoking Status:  quit  > 6 months Does Patient Exercise:  no Seat Belt Use:  yes Drug Use:  never Sexual History:  currently monogamous  Review of Systems       The patient complains of weight gain, dyspnea on exertion, peripheral edema, and prolonged cough.  The patient denies anorexia, fever, weight loss, vision loss, decreased hearing, hoarseness, chest pain, syncope, headaches, hemoptysis, abdominal pain, melena, hematochezia, severe indigestion/heartburn, hematuria, incontinence, muscle weakness, suspicious skin lesions, transient blindness, difficulty walking, depression, and abnormal bleeding.    Physical Exam  General:  Well-developed,well-nourished,in no acute distress; alert,appropriate and cooperative throughout examination Head:  Normocephalic and atraumatic without obvious abnormalities.  Eyes:  No corneal or conjunctival inflammation noted. EOMI. Perrla.  Ears:  External ear exam shows no significant lesions or deformities.  Otoscopic examination reveals clear canals, tympanic membranes are intact bilaterally without bulging, retraction, inflammation or discharge. Hearing is grossly normal bilaterally. Nose:  External nasal examination shows no deformity or inflammation. Nasal mucosa are pink and moist without lesions or exudates. Mouth:  Oral mucosa and oropharynx without lesions or exudates.  Teeth in good repair. Neck:  No deformities, masses, or tenderness noted. Lungs:  Normal respiratory effort, chest expands symmetrically. Lungs are clear to auscultation, no crackles or wheezes. Heart:  normal rate, regular rhythm, and grade 1 /6 systolic murmur.   Abdomen:  Bowel sounds positive,abdomen soft and non-tender without masses, organomegaly or hernias noted. Msk:  No deformity or scoliosis noted of thoracic or lumbar spine.   Pulses:  R and L carotiddorsalis pedis and posterior tibial pulses are full and equal bilaterally Extremities:  No clubbing, cyanosis, edema, or deformity noted with normal  full range of motion of all joints.   Neurologic:  No cranial nerve deficits noted. Station and gait are normal. Plantar reflexes are down-going bilaterally. DTRs are symmetrical throughout. Sensory, motor and coordinative functions appear intact. Skin:  Intact without suspicious lesions or rashes Cervical Nodes:  No lymphadenopathy noted Psych:  Cognition and judgment appear intact. Alert and cooperative with normal attention span and concentration. No apparent delusions, illusions, hallucinations   Impression & Recommendations:  Problem # 1:  CHF (ICD-428.0)  His updated medication list for this problem includes:    Lisinopril 20 Mg Tabs (Lisinopril) .[name]... 1 once daily    Digoxin 0.25 Mg Tabs (Digoxin) .[name]... 1/2 once daily    Furosemide 40 Mg Tabs (Furosemide) .[name]... 1 once daily and 1 extra if wt is over 196    Coumadin 5 Mg Tabs (Warfarin sodium) .[name]... Per anticoagulation clinic Recent episode of CHF, EF of 40%, avoid sodium and will refer to nutritionist for further education. increase mild  exercise, decrease by mouth intake, use Furosemide as needed. f/u w/ cardiology  Orders: Nutrition Referral (Nutrition)  Problem # 2:  ATRIAL FLUTTER (ICD-427.32)  The following medications were removed from the medication list:    Amiodarone Hcl 200 Mg Tabs (Amiodarone hcl) .[name]... 1 once daily His updated medication list for this problem includes:    Digoxin 0.25 Mg Tabs (Digoxin) .[name]... 1/2 once daily    Coumadin 5 Mg Tabs (Warfarin sodium) .[name]... Per anticoagulation clinic    Diltiazem Hcl Er Beads 240 Mg Xr24h-cap (Diltiazem hcl er beads) .[name]... 1 tablet by mouth Regular rhythm today, given rx for Coumadin today since he is almost out and he will discuss dosing and further rx with cardiology and Coumadin clinic  Problem # 3:  DM (ICD-250.00)  His updated medication list for this problem includes:    Lisinopril 20 Mg Tabs (Lisinopril) .[name]... 1 once daily    Metformin Hcl 500 Mg Tabs (Metformin  hcl) .[name]... 1 four times a day    Diabeta 5 Mg Tabs (Glyburide) .[name]... 1 tab by mouth qd Monitor sugars closely, eat small and frequent meals w/lean proteins and complex carbs.  Orders: Nutrition Referral (Nutrition)  Problem # 4:  INSOMNIA, HX OF (ICD-V15.89) Given rx for his Temazepam which works well for him. This is a long standing problem  Problem # 5:  DYSLIPIDEMIA (ICD-272.4)  His updated medication list for this problem includes:    Crestor 20 Mg Tabs (Rosuvastatin calcium) .[name]... 1 once daily, rf today chec an FLP prior to next visit  Orders: Nutrition Referral (Nutrition)  Complete Medication List: 1)  Lisinopril 20 Mg Tabs (Lisinopril) .[name].. 1 once daily 2)  Crestor 20 Mg Tabs (Rosuvastatin calcium) .[name].. 1 once daily 3)  Combivent 18-103 Mcg/act Aero (Ipratropium-albuterol) .... 2 puffs every 4 hours as needed 4)  Digoxin 0.25 Mg Tabs (Digoxin) .... 1/2 once daily 5)  Furosemide 40 Mg Tabs (Furosemide) .[name].. 1 once daily and 1 extra if wt is over 196 6)  Metformin Hcl 500 Mg Tabs (Metformin hcl) .[name].. 1 four times a day 7)  Prilosec 40 Mg Cpdr (Omeprazole) .[name].. 1 once daily 8)  Temazepam 15 Mg Caps (Temazepam) .[name].. 1 at bedtime as needed 9)  Coumadin 5 Mg Tabs (Warfarin sodium) .... Per anticoagulation clinic 10)  Potassium Chloride Crys Cr 20 Meq Cr-tabs (Potassium chloride crys cr) .[name].. 1 tab by mouth 11)  Diltiazem Hcl Er Beads 240 Mg Xr24h-cap (Diltiazem hcl er beads) .[name].. 1 tablet by mouth 12)  Diabeta 5 Mg Tabs (Glyburide) .[name].. 1 tab by mouth qd  Patient Instructions: 1)  Please schedule a follow-up appointment in 1 month.  2)  Limit your Sodium(salt) .  3)  Check your blood sugars regularly. If your readings are usually above:  or below 70 you should contact our office.  4)  Hepatic Panel prior to visit ICD-9: 250.0 5)  Lipid panel prior to visit ICD-9 : 272.0 6)  TSH prior to visit ICD-9 : 278.0 7)  CBC w/ Diff prior to visit ICD-9 : 278.0 8)  HgBA1c prior to  visit  ICD-9: 250.0 9)  Urine Microalbumin prior to visit ICD-9 : 250.0 10)  Minimize simple carbs, use lean proteins small frequent meals and see Nutritionist for further management. 11)  Release of Records Dr Nicholos Johns, [facility] Prescriptions: CRESTOR 20 MG TABS (ROSUVASTATIN CALCIUM) 1 once daily  #30 x 3   Entered and Authorized by:   Danise Edge MD   Signed  by:   Danise Edge MD on 12/05/2009   Method used:   Electronically to        [facility] [address] (retail)       [address], [address]       [place], [place]  [address]       Ph: [phone]       Fax: [phone]   RxID:   [id number] DIABETA 5 MG TABS (GLYBURIDE) 1 tab by mouth qd  #30 x 1   Entered and Authorized by:   Danise Edge MD   Signed by:   Danise Edge MD on 12/05/2009   Method used:   Electronically to        [facility] [address] (retail)       [address], [address]       [place], [place]  [address]       Ph: [phone]       Fax: [phone]   RxID:   [id number] TEMAZEPAM 15 MG CAPS (TEMAZEPAM) 1 at bedtime as needed  #30 x 1   Entered and Authorized by:   Danise Edge MD   Signed by:   Danise Edge MD on 12/05/2009   Method used:   Print then Give to Patient   RxID:   [id number] COUMADIN 5 MG TABS (WARFARIN SODIUM) per anticoagulation clinic  #30 x 1   Entered and Authorized by:   Danise Edge MD   Signed by:   Danise Edge MD on 12/05/2009   Method used:   Electronically to        [facility] [address] (retail)       [address], [address]       [place], [place]  [address]       Ph: [phone]       Fax: [phone]   RxID:   [id number]

## 2010-07-07 NOTE — Progress Notes (Signed)
Summary: Metformin  Phone Note Call from Patient   Summary of Call: Pt states he just found out that Metformin comes in a 1000mg  slow release? Pt  wants to know if he can switch to the 1000mg  two times a day instead of Metformin 500mg  four times a day? Please advise Initial call taken by: Josph Macho RMA,  January 20, 2010 10:32 AM  Follow-up for Phone Call        OK to change to Metformin ER 1000mg  by mouth two times a day may still experience increased SE such as diarrhea but less likely with this fomulation. #60 with 2 rf Follow-up by: Danise Edge MD,  January 20, 2010 12:01 PM  Additional Follow-up for Phone Call Additional follow up Details #1::        Tried to call pt at home no answer no machine. Med sent to [facility]  Additional Follow-up by: Josph Macho RMA,  January 20, 2010 1:19 PM    New/Updated Medications: METFORMIN HCL 1000 MG TABS (METFORMIN HCL) 1 tablet two times a day Prescriptions: METFORMIN HCL 1000 MG TABS (METFORMIN HCL) 1 tablet two times a day  #60 x 2   Entered by:   Josph Macho RMA   Authorized by:   Danise Edge MD   Signed by:   Josph Macho RMA on 01/20/2010   Method used:   Electronically to        [facility] [address] (retail)       [address], [address]       [place], [place]  [address]       Ph: [phone]       Fax: [phone]   RxID:   [id number]

## 2010-07-07 NOTE — Letter (Signed)
Summary: [facility]   Imported By: Sherian Rein 02/11/2010 08:00:46  _____________________________________________________________________  External Attachment:    Type:   Image     Comment:   External Document

## 2010-07-07 NOTE — Assessment & Plan Note (Signed)
Summary: discuss CT [name]//jrc   Visit Type:  Follow-up Primary Gradie Butrick/Referring Zakaria Sedor:  Dr. Nicholos Johns - PMD, Dr Marchelle Gearing -= Pulmonary, Dr Rachelle Hora Coituru - Cards  CC:  [name]. Kevin states still SOB. [name]  History of Present Illness: OV 11/11/2009: 60 year old male very limited smoker in past. Admitted in May 2011 for RML pneumona but also simultatneosuly had problems with NICM - ef 35%, and A Fib RVR needing cardioversion and amiodarone (See past medhx). After discharte also had readmission for C Diff. HE is following at office today for pneumonia. Since discharge does not feel back to baseline. Still very dyspneic with minimal exertion. Also having Rt lateral chest pain that is constant and is moderate in severity and worsened with raising arm. No improvement  in pain. Has associated fatigue and nausea and vomit. Has see Dr. [name] last week; repeat echo is pending. REC: FU in 1 month with CXR. Dr. Sabino Niemann to address nausea by stopping amiodaorne  OV 12/19/2009: Followup dyspnea/fatigue, pneumonia and rt chest pain. Since lsat visit rt chest pain has resolved. Nausea resolved after stopping amiodarone. However, he still has exertional fatigue and dyspnea for activities such as walking to yard and back or moving chairs across rooms. Dyspnea relieved by rest but fatigue less so. He now states that dyspnea has actually been present for years and recollects DR. [name] (former PMD) doing CT chest in 2007. I reviewd this and it shows clear lung fields. WE walked him today and he does not desaturate. He wonders if dyspnea is due to his medicaitons. He is on an ACE inhibitor but denies cough, wheezing, sputum. Of note, he is off amidarone. REC: FULL PFT -> 01/21/2010 - TLC 77%, DLCO 56%, FVC 3.5L/72%, RAtio 77. -> GET CT CHEST   March 30, 2010: Followup dyspnea. had CT chest 01/21/2010: Shows clearance of RML pneumonia and effusion. No infitlrates or ILD. However, still dyspneic  This has not changed since lsat visit. Exertional. RElieved by rest. Somedays worse. Somedays better but always there everyday with exertion. Denies associated wheeze, cough, pnd, symcope, edema. Occ associaed sneezing +. No fever NOTEL He has gained weight but unclear how much. Not had flu shot yet. Wil have it toda   Preventive Screening-Counseling & Management  Alcohol-Tobacco     Alcohol drinks/day: 0     Smoking Status: quit > 6 months     Year Started: 1975     Year Quit: 1983     Pack years: 4  Current Medications (verified): 1)  Lisinopril 20 Mg Tabs (Lisinopril) .[name].. 1 Tab By Mouth Bid 2)  Crestor 20 Mg Tabs (Rosuvastatin Calcium) .[name].. 1 Once Daily 3)  Combivent 18-103 Mcg/act Aero (Ipratropium-Albuterol) .... 2 Puffs Every 4 Hours As Needed 4)  Digoxin 0.25 Mg Tabs (Digoxin) .... 1/2 Once Daily 5)  Furosemide 40 Mg Tabs (Furosemide) .[name].. 1 Once Daily and 1 Extra If Wt Is Over 196 6)  Metformin Hcl 1000 Mg Tabs (Metformin Hcl) .[name].. 1 Tablet Two Times A Day 7)  Prilosec 40 Mg Cpdr (Omeprazole) .[name].. 1 Once Daily 8)  Temazepam 15 Mg Caps (Temazepam) .[name].. 1 At Bedtime As Needed 9)  Aspir-Low 81 Mg Tbec (Aspirin) .... Take 1 Tablet By Mouth Once A Day 10)  Potassium Chloride Crys Cr 20 Meq Cr-Tabs (Potassium Chloride Crys Cr) .[name].. 1 Tab By Mouth 11)  Diltiazem Hcl Er Beads 240 Mg Xr24h-Cap (Diltiazem Hcl Er Beads) .[name].. 1 Tablet By Mouth 12)  Diabeta 5 Mg Tabs (Glyburide) .[name].. 1 Tab By Mouth Qd 13)  Meloxicam 7.5 Mg Tabs (Meloxicam) .[name].. 1 Tab By Mouth Two Times A Day As Needed Pain With Food 14)  Colcrys 0.6 Mg Tabs (Colchicine) .[name].. 1 Tab By Mouth Q 2 Hours As Needed Pain To A Max of 6 Tabs/24hr, Pain Relief or Diarrhea  Allergies (verified): 1)  ! Sulfa  Past History:  Past medical, surgical, family and social histories (including risk factors) reviewed, and no changes noted (except as noted below).  Past Medical History: Hypertension EMG/NCS 01/17/2006 .Marland KitchenMarland KitchenDr Anne Hahn >  Suuggestive of diabetic neuropathy #Cough April 2007 REolsved  - CT chest 09/20/2005 - No hilar or lung mass (prominent hilum on CXR was due to pulmonary vessels)  -> was given Singulair by Dr [name] in 2004. No record of PFTs  Past Surgical History: Reviewed history from 12/05/2009 and no changes required. Bilat. foot surgery removed part of 5th metatarsal to correct curvature of  toes  Past Pulmonary History:  Pulmonary History: admission 10/13/2009- 10/21/2009  1. Atrial fibrillation with rapid ventricular response, right middle       lobe pneumonia, possible source.   2. Chest pain secondary to pneumonia.   3. Moderate hypoxic respiratory failure secondary to pneumonia and       heart failure.   4. Type 2 diabetes.   5. Hypertension.   6. Status post cardiac catheterization on Oct 16, 2009.   7. Nonischemic cardiomyopathy.      PROCEDURES:   1. Cardiac catheterization on Oct 16, 2009 for chest pain, congestive       heart failure, new-onset atrial flutter, shows an EF of 40-45%,       nonischemic cardiomyopathy with mildly depressed left ventricular       systolic function but severe diastolic dysfunction with elevated       filling pressures, possible tachycardia related cardiomyopathy       related to atrial flutter versus acute parapneumonic myocarditis,       stress-induced cardiomyopathy.   2. Status post synchronized direct cardioversion on Oct 13, 2009.   3. Chest x-ray on May 9th shows right middle lobe pneumonia.  Chest x-       ray on the 10th shows increase in right lung base, airspace       disease, cardiomegaly with pulmonary vascular congestion.   4. CT angio on the 14th shows right-sided airspace disease consistent       with infection, small right-sided pleural effusion, cardiomegaly,       enlargement of the pulmonary outflow track suggestive of pulmonary       arterial hypertension.  Last chest x-ray of May 15th shows right       middle lobe pneumonia.       Pulmonary History-continued: DISCHARGE DIAGNOSES: 5/22/20211 - 10/28/2009  1. Hypoglycemia in the setting of diabetes secondary to Amaryl plus       metformin.   2. Hypertension.   3. Dyslipidemia.   4. Atrial fibrillation on amiodarone, digoxin, diltiazem and Coumadin.   5. Diarrhea. C. diff negative, resolved.   6. Microcytic anemia, needing outpatient followup.   7. Chronic systolic heart failure.   8. Nonischemic cardiomyopathy, ejection fraction last one was 35%.   Family History: Reviewed history from 12/05/2009 and no changes required. ? Lung CA- Father  Clotting disorder- Mother Heart dz- Mother Father: deceased @ 23, cancer, lung disease, smoker Mother: deceased @ 71, heart disease s/p MI, HTN, DM, hyperlipidemia,  strokes, snuff Siblings:  Sister: 45, DM, HTN, smoker Sister: 13, brain aneurysm, clipped, seizure d/o w/ aneurysm, smoker MGM: deceased @ late 43s , Leukemia? MGF: unknown hx died older PGM: unknown hx ZOX:WRUEAVW hx Children: Son: 58, A&W Son: 57,   Social History: Reviewed history from 11/11/2009 and no changes required. Married Children Disabled Former smoker.  Quit in 1982.  Smoked approx 10 yrs up to 1/21 ppd. No ETOH  Review of Systems       The patient complains of shortness of breath with activity and shortness of breath at rest.  The patient denies productive cough, non-productive cough, coughing up blood, chest pain, irregular heartbeats, acid heartburn, indigestion, loss of appetite, weight change, abdominal pain, difficulty swallowing, sore throat, tooth/dental problems, headaches, nasal congestion/difficulty breathing through nose, sneezing, itching, ear ache, anxiety, depression, hand/feet swelling, joint stiffness or pain, rash, change in color of mucus, and fever.    Vital Signs:  Patient profile:   60 year old male Height:      70 inches Weight:      227.25 pounds BMI:     32.72 O2 Sat:      94 % on Room air Temp:     98.6  degrees F oral Pulse rate:   96 / minute BP sitting:   130 / 88  (right arm) Cuff size:   regular  Vitals Entered By: Carron Curie CMA (March 30, 2010 2:05 PM)  O2 Flow:  Room air CC: [name]. Kevin states still SOB.  Comments Medications reviewed with patient Carron Curie CMA  March 30, 2010 2:07 PM Daytime phone number verified with patient.    Physical Exam  General:  overeweight looks like has gained some more weight Head:  normocephalic and atraumatic Eyes:  PERRLA/EOM intact; conjunctiva and sclera clear Ears:  TMs intact and clear with normal canals Nose:  no deformity, discharge, inflammation, or lesions Mouth:  no deformity or lesions Neck:  JVD.  + Chest Wall:  no deformities noted Lungs:  clear bilaterally to auscultation and percussion Heart:  regular rate and rhythm, S1, S2 without murmurs, rubs, gallops, or clicks Abdomen:  bowel sounds positive; abdomen soft and non-tender without masses, or organomegaly   Msk:  no deformity or scoliosis noted with normal posture Pulses:  pulses normal Extremities:  no clubbing, cyanosis, edema, or deformity noted Neurologic:  CN II-XII grossly intact with normal reflexes, coordination, muscle strength and tone Skin:  intact without lesions or rashes Cervical Nodes:  no significant adenopathy Axillary Nodes:  no significant adenopathy Psych:  alert and cooperative; normal mood and affect; normal attention span and concentration   CT of Chest  Procedure date:  03/12/2010  Findings:      no ild no effusion no pneumonia  Impression & Recommendations:  Problem # 1:  PNEUMONIA (ICD-486) Assessment Improved may 2011 RML PNA has cleard on CT 03/12/2010  plan no futher followup  Problem # 2:  DYSPNEA (ICD-786.05) Assessment: Unchanged  puzzilng why he still has class 3 dyspnea. Suspect it is due to deconditioning, obesity and NICM  ef 35%. PFT 01/21/2010 shows mild restruction wtih  low DLCO but CT 03/12/2010 shows no ild. However, he states he has been dyspneci since age 51  and is very keen oin getting to boottom of etioloogy.  PLAN CPST  Orders: Cardio-Pulmonary Stress Test Referral (Cardio-Pulmon) Est. Patient Level III (09811)  Medications Added to Medication List This Visit: 1)  Aspir-low 81 Mg  Tbec (Aspirin) .... Take 1 tablet by mouth once a day  Patient Instructions: 1)  please have CPST bike test  2)  return after test for followup 3)  have flu shot today   Immunization History:  Pneumovax Immunization History:    Pneumovax:  historical (10/07/2009)  Appended Document: flu shot Flu Vaccine Consent Questions     Do you have a history of severe allergic reactions to this vaccine? no    Any prior history of allergic reactions to egg and/or gelatin? no    Do you have a sensitivity to the preservative Thimersol? no    Do you have a past history of Guillan-Barre Syndrome? no    Do you currently have an acute febrile illness? no    Have you ever had a severe reaction to latex? no    Vaccine information given and explained to patient? yes    Are you currently pregnant? no    Lot Number:[id number]   Exp Date:12/05/2010   Site Given right Deltoid IM Carron Curie CMA  March 30, 2010 4:56 PM      Clinical Lists Changes  Orders: Added new Service order of Admin 1st Vaccine (16109) - Signed Added new Service order of Flu Vaccine 22yrs + [phone]) - Signed Observations: Added new observation of FLU VAX VIS: 12/30/09 version (03/30/2010 16:55) Added new observation of FLU VAXLOT: [id number] (03/30/2010 16:55) Added new observation of FLU VAXMFR: [employer] (03/30/2010 16:55) Added new observation of FLU VAX EXP: 12/05/2010 (03/30/2010 16:55) Added new observation of FLU VAX DSE: 0.1ml (03/30/2010 16:55) Added new observation of FLU VAX: Fluvax 3+ (03/30/2010 16:55)

## 2010-07-07 NOTE — Progress Notes (Signed)
Summary: fu needed  Phone Note Outgoing Call   Summary of Call: Candise Bowens, he is inpatient in 2900. Needs fu with me in 3 weeks for pna  Initial call taken by: Kalman Shan MD,  Oct 16, 2009 2:37 PM  Follow-up for Phone Call        pt scheduled for [name] on 11/11/09 at 4:10. Carron Curie CMA  Oct 22, 2009 3:09 PM

## 2010-07-07 NOTE — Assessment & Plan Note (Signed)
Summary: 3 month fup//ccm   Vital Signs:  Patient profile:   60 year old male Height:      70 inches (177.80 cm) Weight:      228 pounds (103.64 kg) O2 Sat:      96 % on Room air Temp:     99.0 degrees F (37.22 degrees C) oral Pulse rate:   97 / minute BP sitting:   150 / 92  (left arm) Cuff size:   large  Vitals Entered By: Josph Macho RMA (April 06, 2010 3:49 PM)  O2 Flow:  Room air CC: 3 month follow up/ CF Is Patient Diabetic? Yes   History of Present Illness: this 60 year old male in today for reevaluation of multiple medical problems. He continues to struggle with shortness of breath especially on exertion so far pulmonary workup only revealed some asthma. His repeat CAT scan showed resolution of his pneumonia. He denies fevers, chills, cough, chest pain does have some intermittent palpitations, no recent illness. He is denying any GI or GU complaints. His major complaint is of persistent fatigue he is hypersomnolent most days has trouble falling asleep and staying asleep temazepam helps him sleep just for a few hours. His wife is now reporting witnessing some Episodes. He is having worsening central adiposity, fatigue and malaise  Current Medications (verified): 1)  Lisinopril 20 Mg Tabs (Lisinopril) .[name].. 1 Tab By Mouth Bid 2)  Crestor 20 Mg Tabs (Rosuvastatin Calcium) .[name].. 1 Once Daily 3)  Combivent 18-103 Mcg/act Aero (Ipratropium-Albuterol) .... 2 Puffs Every 4 Hours As Needed 4)  Digoxin 0.25 Mg Tabs (Digoxin) .... 1/2 Once Daily 5)  Furosemide 40 Mg Tabs (Furosemide) .[name].. 1 Once Daily and 1 Extra If Wt Is Over 196 6)  Metformin Hcl 1000 Mg Tabs (Metformin Hcl) .[name].. 1 Tablet Two Times A Day 7)  Prilosec 40 Mg Cpdr (Omeprazole) .[name].. 1 Once Daily 8)  Temazepam 15 Mg Caps (Temazepam) .[name].. 1 At Bedtime As Needed 9)  Aspir-Low 81 Mg Tbec (Aspirin) .... Take 1 Tablet By Mouth Once A Day 10)  Potassium Chloride Crys Cr 20 Meq Cr-Tabs (Potassium Chloride Crys Cr) .[name].. 1 Tab  By Mouth 11)  Diltiazem Hcl Er Beads 240 Mg Xr24h-Cap (Diltiazem Hcl Er Beads) .[name].. 1 Tablet By Mouth 12)  Diabeta 5 Mg Tabs (Glyburide) .[name].. 1 Tab By Mouth Qd 13)  Meloxicam 7.5 Mg Tabs (Meloxicam) .[name].. 1 Tab By Mouth Two Times A Day As Needed Pain With Food 14)  Colcrys 0.6 Mg Tabs (Colchicine) .[name].. 1 Tab By Mouth Q 2 Hours As Needed Pain To A Max of 6 Tabs/24hr, Pain Relief or Diarrhea  Allergies (verified): 1)  ! Sulfa  Past History:  Past medical history reviewed for relevance to current acute and chronic problems. Social history (including risk factors) reviewed for relevance to current acute and chronic problems.  Past Medical History: Reviewed history from 03/30/2010 and no changes required. Hypertension EMG/NCS 01/17/2006 .Marland KitchenMarland KitchenDr Anne Hahn > Suuggestive of diabetic neuropathy #Cough April 2007 REolsved  - CT chest 09/20/2005 - No hilar or lung mass (prominent hilum on CXR was due to pulmonary vessels)  -> was given Singulair by Dr [name] in 2004. No record of PFTs  Social History: Reviewed history from 11/11/2009 and no changes required. Married Children Disabled Former smoker.  Quit in 1982.  Smoked approx 10 yrs up to 1/21 ppd. No ETOH  Review of Systems      See HPI  Physical Exam  General:  Well-developed,well-nourished,in  no acute distress; alert,appropriate and cooperative throughout examination Mouth:  Oral mucosa and oropharynx without lesions or exudates.  Lungs:  Normal respiratory effort, chest expands symmetrically. Lungs are clear to auscultation, no crackles or wheezes. Heart:  Normal  regular rhythm. S1 and S2 normal without gallop, click, rub or other extra sounds.grade  2/6 systolic murmur.   Abdomen:  Bowel sounds positive,abdomen soft and non-tender without masses, organomegaly or hernias noted. Extremities:  No clubbing, cyanosis, edema, or deformity noted  Psych:  Cognition and judgment appear intact. Alert and cooperative with normal attention  span and concentration. No apparent delusions, illusions, hallucinations   Impression & Recommendations:  Problem # 1:  SLEEP APNEA (ICD-780.57) Wife witnesses apneic episodes and he is stuggling with worsening weight gain and HTN referred  for sleep study  Problem # 2:  UNSPECIFIED ANEMIA (ICD-285.9)  Orders: Hemoccult Cards -3 specimans (take home) (16109) Repeat CBC with next visit. will repeat CBC at next visit  Problem # 3:  CHF (ICD-428.0)  His updated medication list for this problem includes:    Lisinopril 20 Mg Tabs (Lisinopril) .[name]... 1 tab by mouth bid    Digoxin 0.25 Mg Tabs (Digoxin) .[name]... 1/2 once daily    Furosemide 40 Mg Tabs (Furosemide) .[name]... 1 once daily and 1 extra if wt is over 196    Aspir-low 81 Mg Tbec (Aspirin) .[name]... Take 1 tablet by mouth once a day    Metoprolol Succinate 25 Mg Xr24h-tab (Metoprolol succinate) .[name]... 1 tab by mouth daily  Orders: Sleep Disorder Referral (Sleep Disorder) Has a stress test already ordered to further evaluate his SOB  Problem # 4:  HYPERTENSION (ICD-401.9)  His updated medication list for this problem includes:    Lisinopril 20 Mg Tabs (Lisinopril) .[name]... 1 tab by mouth bid    Furosemide 40 Mg Tabs (Furosemide) .[name]... 1 once daily and 1 extra if wt is over 196    Diltiazem Hcl Er Beads 240 Mg Xr24h-cap (Diltiazem hcl er beads) .[name]... 1 tablet by mouth    Metoprolol Succinate 25 Mg Xr24h-tab (Metoprolol succinate) .[name]... 1 tab by mouth daily  Complete Medication List: 1)  Lisinopril 20 Mg Tabs (Lisinopril) .[name].. 1 tab by mouth bid 2)  Crestor 20 Mg Tabs (Rosuvastatin calcium) .[name].. 1 once daily 3)  Combivent 18-103 Mcg/act Aero (Ipratropium-albuterol) .... 2 puffs every 4 hours as needed 4)  Digoxin 0.25 Mg Tabs (Digoxin) .... 1/2 once daily 5)  Furosemide 40 Mg Tabs (Furosemide) .[name].. 1 once daily and 1 extra if wt is over 196 6)  Metformin Hcl 1000 Mg Tabs (Metformin hcl) .[name].. 1 tablet two times a day 7)  Prilosec 40 Mg Cpdr  (Omeprazole) .[name].. 1 once daily 8)  Temazepam 15 Mg Caps (Temazepam) .[name].. 1 at bedtime as needed 9)  Aspir-low 81 Mg Tbec (Aspirin) .... Take 1 tablet by mouth once a day 10)  Potassium Chloride Crys Cr 20 Meq Cr-tabs (Potassium chloride crys cr) .[name].. 1 tab by mouth 11)  Diltiazem Hcl Er Beads 240 Mg Xr24h-cap (Diltiazem hcl er beads) .[name].. 1 tablet by mouth 12)  Diabeta 5 Mg Tabs (Glyburide) .[name].. 1 tab by mouth qd 13)  Meloxicam 7.5 Mg Tabs (Meloxicam) .[name].. 1 tab by mouth two times a day as needed pain with food 14)  Colcrys 0.6 Mg Tabs (Colchicine) .[name].. 1 tab by mouth q 2 hours as needed pain to a max of 6 tabs/24hr, pain relief or diarrhea 15)  Metoprolol Succinate 25 Mg Xr24h-tab (  Metoprolol succinate) .[name].. 1 tab by mouth daily  Patient Instructions: 1)  Please schedule a follow-up appointment in 1 to 2  month or if symptoms worsen 2)  Anytime you are  SOB check your pulse 3)  Try [facility] night time leg cramp medicine 4)  Drink a Gatorade daily if cramps worsen, consider Magniesium supplement if every other day for a few weeks. 5)  CBC w/ Diff prior to visit ICD-9 : 285.9 6)  Magnesium: 2327.52 7)  Complete your hemoccult cards and return them soon.  Prescriptions: TEMAZEPAM 15 MG CAPS (TEMAZEPAM) 1 at bedtime as needed  #30 x 0   Entered and Authorized by:   Danise Edge MD   Signed by:   Danise Edge MD on 04/06/2010   Method used:   Print then Give to Patient   RxID:   [id number] METOPROLOL SUCCINATE 25 MG XR24H-TAB (METOPROLOL SUCCINATE) 1 tab by mouth daily  #30 x 2   Entered and Authorized by:   Danise Edge MD   Signed by:   Danise Edge MD on 04/06/2010   Method used:   Electronically to        [facility] (retail)       [address], [address]       [place], [place]  [address]       Ph: [phone]       Fax: [phone]   RxID:   [id number]    Orders Added: 1)  Sleep Disorder Referral [Sleep Disorder] 2)   Hemoccult Cards -3 specimans (take home) [82272] 3)  Est. Patient Level IV [74259]

## 2010-07-07 NOTE — Progress Notes (Signed)
Summary: Sleep Consult scheduled with Dr. Vassie Loll  Phone Note From Other Clinic   Caller: [facility] OFFICE-DR [phone] EXT [id number]-TERRY Call For: [email or web address] Summary of Call: Terri w/ Dr. Mariel Aloe office calling to request that we go ahead and schedule sleep study for patient.  Patient is still really fatigued. Initial call taken by: Lehman Prom,  April 07, 2010 2:43 PM  Follow-up for Phone Call        [place], spoke with Aurther Loft.  She states Dr. Abner Greenspan is wanting pt to be set up for sleep study for hypersomnia, fatigue, and pt's wife witnessing apnea episodes at night.  Requesting MR to order this.  Advised MR sees pt for his pulmonary problems but he is not a sleep doctor therefore, SLC scheduled with RA for 11.15.11 at 10:15am -- Aurther Loft ok with this and will inform pt of appt, to arrive at 10am and bring all meds to OV.  Follow-up by: Gweneth Dimitri RN,  April 07, 2010 3:07 PM

## 2010-07-07 NOTE — Assessment & Plan Note (Signed)
Summary: 1 MONTH/CB   Visit Type:  Follow-up Primary Provider/Referring Provider:  Dr. Nicholos Johns - PMD, Dr Marchelle Gearing -= Pulmonary, Dr Rachelle Hora Co[place]uru - Cards  CC:  1 month follow up--c/o sob while at rest and with activity--some dizziness at times while up and moving around.  History of Present Illness: OV 11/11/2009: 60 year old male very limited smoker in past. Admitted in May 2011 for RML pneumona but also simultatneosuly had problems with NICM - ef 35%, and A Fib RVR needing cardioversion and amiodarone (See past medhx). After discharte also had readmission for C Diff. HE is following at office today for pneumonia. Since discharge does not feel back to baseline. Still very dyspneic with minimal exertion. Also having Rt lateral chest pain that is constant and is moderate in severity and worsened with raising arm. No improvement  in pain. Has associated fatigue and nausea and vomit. Has see Dr. [name] last week; repeat echo is pending. REC: FU in 1 month with CXR. Dr. Sabino Niemann to address nausea by stopping amiodaorne  OV 12/19/2009: Followup dyspnea/fatigue, pneumonia and rt chest pain. Since lsat visit rt chest pain has resolved. Nausea resolved after stopping amiodarone. However, he still has exertional fatigue and dyspnea for activities such as walking to yard and back or moving chairs across rooms. Dyspnea relieved by rest but fatigue less so. He now states that dyspnea has actually been present for years and recollects DR. [name] (former PMD) doing CT chest in 2007. I reviewd this and it shows clear lung fields. WE walked him today and he does not desaturate. He wonders if dyspnea is due to his medicaitons. He is on an ACE inhibitor but denies cough, wheezing, sputum. Of note, he is off amidarone  Preventive Screening-Counseling & Management  Alcohol-Tobacco     Alcohol drinks/day: 0     Smoking Status: quit > 6 months     Year Started: 1975     Year Quit: 1983     Pack years:  4  Caffeine-Diet-Exercise     Diet Comments: minmal meat     Does Patient Exercise: no  Allergies: 1)  ! Sulfa  Comments:  Nurse/Medical Assistant: The patient's medications and allergies were reviewed with the patient and were updated in the Medication and Allergy Lists.  Past History:  Family History: Last updated: 12/05/2009 ? Lung CA- Father  Clotting disorder- Mother Heart dz- Mother Father: deceased @ 49, cancer, lung disease, smoker Mother: deceased @ 45, heart disease s/p MI, HTN, DM, hyperlipidemia, strokes, snuff Siblings:  Sister: 20, DM, HTN, smoker Sister: 49, brain aneurysm, clipped, seizure d/o w/ aneurysm, smoker MGM: deceased @ late 78s , Leukemia? MGF: unknown hx died older PGM: unknown hx [id number] hx Children: Son: 43, A&W Son: 78,   Social History: Last updated: 11/11/2009 Married Children Disabled Former smoker.  Quit in 1982.  Smoked approx 10 yrs up to 1/21 ppd. No ETOH  Risk Factors: Alcohol Use: 0 (12/19/2009) Diet: minmal meat (12/19/2009) Exercise: no (12/19/2009)  Risk Factors: Smoking Status: quit > 6 months (12/19/2009)  Past Medical History: Hypertension EMG/NCS 01/17/2006 .Marland KitchenMarland KitchenDr Anne Hahn > Suuggestive of diabetic neuropathy #Cough April 2007 > CT chest 09/20/2005 - No hilar or lung mass (prominent hilum on CXR was due to pulmonary vessels) > was given Singulair by Dr [name] in 2004. No record of PFTs  Past Surgical History: Reviewed history from 12/05/2009 and no changes required. Bilat. foot surgery removed part of 5th metatarsal to correct curvature of  toes  Past Pulmonary History:  Pulmonary History: admission 10/13/2009- 10/21/2009  1. Atrial fibrillation with rapid ventricular response, right middle       lobe pneumonia, possible source.   2. Chest pain secondary to pneumonia.   3. Moderate hypoxic respiratory failure secondary to pneumonia and       heart failure.   4. Type 2 diabetes.   5. Hypertension.    6. Status post cardiac catheterization on Oct 16, 2009.   7. Nonischemic cardiomyopathy.      PROCEDURES:   1. Cardiac catheterization on Oct 16, 2009 for chest pain, congestive       heart failure, new-onset atrial flutter, shows an EF of 40-45%,       nonischemic cardiomyopathy with mildly depressed left ventricular       systolic function but severe diastolic dysfunction with elevated       filling pressures, possible tachycardia related cardiomyopathy       related to atrial flutter versus acute parapneumonic myocarditis,       stress-induced cardiomyopathy.   2. Status post synchronized direct cardioversion on Oct 13, 2009.   3. Chest x-ray on May 9th shows right middle lobe pneumonia.  Chest x-       ray on the 10th shows increase in right lung base, airspace       disease, cardiomegaly with pulmonary vascular congestion.   4. CT angio on the 14th shows right-sided airspace disease consistent       with infection, small right-sided pleural effusion, cardiomegaly,       enlargement of the pulmonary outflow track suggestive of pulmonary       arterial hypertension.  Last chest x-ray of May 15th shows right       middle lobe pneumonia.      Pulmonary History-continued: DISCHARGE DIAGNOSES: 5/22/20211 - 10/28/2009  1. Hypoglycemia in the setting of diabetes secondary to Amaryl plus       metformin.   2. Hypertension.   3. Dyslipidemia.   4. Atrial fibrillation on amiodarone, digoxin, diltiazem and Coumadin.   5. Diarrhea. C. diff negative, resolved.   6. Microcytic anemia, needing outpatient followup.   7. Chronic systolic heart failure.   8. Nonischemic cardiomyopathy, ejection fraction last one was 35%.   Family History: Reviewed history from 12/05/2009 and no changes required. ? Lung CA- Father  Clotting disorder- Mother Heart dz- Mother Father: deceased @ 100, cancer, lung disease, smoker Mother: deceased @ 70, heart disease s/p MI, HTN, DM, hyperlipidemia, strokes,  snuff Siblings:  Sister: 72, DM, HTN, smoker Sister: 4, brain aneurysm, clipped, seizure d/o w/ aneurysm, smoker MGM: deceased @ late 67s , Leukemia? MGF: unknown hx died older PGM: unknown hx QVZ:[id number] hx Children: Son: 31, A&W Son: 5,   Social History: Reviewed history from 11/11/2009 and no changes required. Married Children Disabled Former smoker.  Quit in 1982.  Smoked approx 10 yrs up to 1/21 ppd. No ETOH  Review of Systems       The patient complains of shortness of breath with activity and shortness of breath at rest.  The patient denies productive cough, non-productive cough, coughing up blood, chest pain, irregular heartbeats, acid heartburn, indigestion, loss of appetite, weight change, abdominal pain, difficulty swallowing, sore throat, tooth/dental problems, headaches, nasal congestion/difficulty breathing through nose, sneezing, itching, ear ache, anxiety, depression, hand/feet swelling, joint stiffness or pain, rash, change in color of mucus, and fever.         pt c/o sometimes being lightheaded  Vital Signs:  Patient profile:   60 year old male Height:      70 inches Weight:      215.25 pounds BMI:     31.00 O2 Sat:      99 % on Room air Temp:     97.9 degrees F oral Pulse rate:   95 / minute BP sitting:   156 / 90  (right arm) Cuff size:   regular  Vitals Entered By: Randell Loop CMA (December 19, 2009 1:58 PM)  O2 Sat at Rest %:  99 O2 Flow:  Room air CC: 1 month follow up--c/o sob while at rest and with activity--some dizziness at times while up and moving around Is Patient Diabetic? Yes Pain Assessment Patient in pain? no      Comments meds and allergies reviewed with pt today daytime phone number reviewed with pt today Randell Loop [facility]  December 19, 2009 2:03 PM   Ambulatory Pulse Oximetry  Resting; HR  96    02 Sat  99%  Lap1 (185 feet)   HR   104   02 Sat   100% ra Lap2 (185 feet)   HR    102   02 Sat   97%ra Lap3 (185 feet)   HR  103    02 Sat   99% ra  XX__Test Completed without Difficulty ___Test Stopped due to: Randell Loop CMA  December 19, 2009 2:59 PM      Physical Exam  General:  well developed, well nourished, in no acute distress Head:  normocephalic and atraumatic Eyes:  PERRLA/EOM intact; conjunctiva and sclera clear Ears:  TMs intact and clear with normal canals Nose:  no deformity, discharge, inflammation, or lesions Mouth:  no deformity or lesions Neck:  JVD.  + Chest Wall:  no deformities noted Lungs:  clear bilaterally to auscultation and percussion Heart:  regular rate and rhythm, S1, S2 without murmurs, rubs, gallops, or clicks Abdomen:  bowel sounds positive; abdomen soft and non-tender without masses, or organomegaly  hepatojugular reflex + Msk:  no deformity or scoliosis noted with normal posture Pulses:  pulses normal Extremities:  no clubbing, cyanosis, edema, or deformity noted Neurologic:  CN II-XII grossly intact with normal reflexes, coordination, muscle strength and tone Skin:  intact without lesions or rashes Cervical Nodes:  no significant adenopathy Axillary Nodes:  no significant adenopathy Psych:  alert and cooperative; normal mood and affect; normal attention span and concentration   Impression & Recommendations:  Problem # 1:  DYSPNEA (ICD-786.05) Assessment Unchanged Still wiht dyspnea.  Walking desaturation test is normal. Clinically appears euvolemic. Per hx thyroid funciton isnormal. Dyspnea likely due to cardiomyopathy or post-hospital deconditioning or both. I tried to call Dr. Sabino Niemann but he is on vacation. Will start by gettng PFTs. If normal, will check BNP. IF that too isnormal, then we might have to do CPST Orders: Rehabilitation Referral (Rehab) Est. Patient Level IV (56213)  Problem # 2:  PNEUMONIA (ICD-486) Assessment: Improved  Orders: T-2 View CXR (71020TC) Est. Patient Level IV (08657)  CXR shows significant clearance   plan repeat cxr in 4-6  weeks Orders: T-2 View CXR (71020TC)  Problem # 3:  CHEST PAIN-UNSPECIFIED (ICD-786.50) Assessment: Improved  rt lateral chest pain resolved  plan no further followupo  Orders: Est. Patient Level IV (84696)  Problem # 4:  NAUSEA (ICD-787.02) Assessment: Improved  resolved after dc of amiodarone  Orders: Est. Patient Level IV (29528)  Patient Instructions: 1)  We will walk  you  for oxygen levels 2)  Pleaes have full breating test - once done call me for review 3)  I will talk to Dr. Sabino Niemann 4)  We will try to get your old chart at [facility] and review 5)  Based on above, I will call in with recommendation for next step   Immunization History:  Influenza Immunization History:    Influenza:  historical (03/28/2008)  Appended Document: 1 MONTH/CB once his PFTs done, put it on my inbox for review  Appended Document: 1 MONTH/CB Ttempt to call pt and see if pft's had been done but the phone number listed is a fax number. Attempted to [facility] to see if pft were done but they were closed when i called. Will try back tomorrow  Appended Document: 1 MONTH/CB Called [facility] and pt has not had pft done yet and attempt to contact pt x2 but the number is a fax number  Appended Document: 1 MONTH/CB Attempt to call pt adn both work phone and home phone and still not able to get in touch with him. contacted pft at [facility] and they stated he was scheduled for july 22 and never showed up.   Appended Document: 1 MONTH/CB Mailed letter. will leave in Jennifer's box. jwr  Appended Document: 1 MONTH/CB Contacted pt via phone before mailing letter. Pt is scheduled for August 4 PFT and states was not aware of July PFT @ hospital. jwr

## 2010-07-07 NOTE — Letter (Signed)
Summary: [facility]   Imported By: Maryln Gottron 01/07/2010 09:29:59  _____________________________________________________________________  External Attachment:    Type:   Image     Comment:   External Document

## 2010-07-07 NOTE — Assessment & Plan Note (Signed)
Summary: 1 month follow up/cjr   Vital Signs:  Patient profile:   60 year old male Height:      70 inches (177.80 cm) Weight:      220.31 pounds (100.14 kg) O2 Sat:      97 % on Room air Temp:     98.6 degrees F (37.00 degrees C) oral Pulse rate:   98 / minute BP sitting:   160 / 82  (left arm) Cuff size:   regular  Vitals Entered By: Josph Macho RMA (January 05, 2010 3:17 PM)  O2 Flow:  Room air CC: 1 month follow up/ CF Is Patient Diabetic? Yes   History of Present Illness: Patient in today for follow up on his new patient appointment. His major complaint today is of persistent fatigue. He continues to follow with pulmonology and is scheduled for PFTs soon to further assess his pulmonary situation and long term SOB which he guesses has been present for at least 10 years. He follows with Dr Bronson Curb [facility] for his cardiac condition, he has been taken off his Amiodorone and his Diltiazem has been increased. He is on Coumadin and is tolerating it well but is not happy about not being able to eat greens. His fatigue does not keep him from doing what he needs to do but he reports he has to push through it most days. he is denying any CP/palp/f/c/malaise. No new complaints. He is reporting he sees a urologist routinely and his last PSA was around 6, with all of his other problems they chose to not proceed with work up at that time. Patient notes testosterone levels have also been running low but they are hesitant to treat that until the PSA is addressed.  Finally he is complaining of a painful firm nodule on the base o fhis left foot. It is firm and tender to palpation and weight baring. Denies any injury, warmth, erythema. Patient reports FSG this am was 131, he had a period of time when he was in and out the hospital and on and off his diabetic meds when his sugars were running as hi as 400 and then at other times dropping to the 50s causing him to skip med doses. He  is no  longer seeing numbers like that and he feels better.  Current Medications (verified): 1)  Lisinopril 20 Mg Tabs (Lisinopril) .[name].. 1 Once Daily 2)  Crestor 20 Mg Tabs (Rosuvastatin Calcium) .[name].. 1 Once Daily 3)  Combivent 18-103 Mcg/act Aero (Ipratropium-Albuterol) .... 2 Puffs Every 4 Hours As Needed 4)  Digoxin 0.25 Mg Tabs (Digoxin) .... 1/2 Once Daily 5)  Furosemide 40 Mg Tabs (Furosemide) .[name].. 1 Once Daily and 1 Extra If Wt Is Over 196 6)  Metformin Hcl 500 Mg Tabs (Metformin Hcl) .[name].. 1 Four Times A Day 7)  Prilosec 40 Mg Cpdr (Omeprazole) .[name].. 1 Once Daily 8)  Temazepam 15 Mg Caps (Temazepam) .[name].. 1 At Bedtime As Needed 9)  Coumadin 5 Mg Tabs (Warfarin Sodium) .... Per Anticoagulation Clinic 10)  Potassium Chloride Crys Cr 20 Meq Cr-Tabs (Potassium Chloride Crys Cr) .[name].. 1 Tab By Mouth 11)  Diltiazem Hcl Er Beads 240 Mg Xr24h-Cap (Diltiazem Hcl Er Beads) .[name].. 1 Tablet By Mouth 12)  Diabeta 5 Mg Tabs (Glyburide) .[name].. 1 Tab By Mouth Qd  Allergies (verified): 1)  ! Sulfa  Past History:  Past medical history reviewed for relevance to current acute and chronic problems. Social history (including risk factors)  reviewed for relevance to current acute and chronic problems.  Past Medical History: Reviewed history from 12/19/2009 and no changes required. Hypertension EMG/NCS 01/17/2006 .Marland KitchenMarland KitchenDr Anne Hahn > Suuggestive of diabetic neuropathy #Cough April 2007 > CT chest 09/20/2005 - No hilar or lung mass (prominent hilum on CXR was due to pulmonary vessels) > was given Singulair by Dr [name] in 2004. No record of PFTs  Social History: Reviewed history from 11/11/2009 and no changes required. Married Children Disabled Former smoker.  Quit in 1982.  Smoked approx 10 yrs up to 1/21 ppd. No ETOH  Review of Systems      See HPI  Physical Exam  General:  Well-developed,well-nourished,in no acute distress; alert,appropriate and cooperative throughout examination Head:  Normocephalic  and atraumatic without obvious abnormalities. No apparent alopecia or balding. Neck:  No deformities, masses, or tenderness noted. Lungs:  Normal respiratory effort, chest expands symmetrically. Lungs are clear to auscultation, no crackles or wheezes. Heart:  Normal rate and regular rhythm. S1 and S2 normal without gallop,  click, rub or other extra sounds.grade1/6 systolic murmur.   Abdomen:  Bowel sounds positive,abdomen soft and non-tender without masses, organomegaly or hernias noted. Extremities:  No clubbing, cyanosis, edema, or deformity noted  Psych:  Cognition and judgment appear intact. Alert and cooperative with normal attention span and concentration. No apparent delusions, illusions, hallucinations   Impression & Recommendations:  Problem # 1:  HYPERTENSION (ICD-401.9)  His updated medication list for this problem includes:    Lisinopril 20 Mg Tabs (Lisinopril) .[name]... 1 tab by mouth bid    Furosemide 40 Mg Tabs (Furosemide) .[name]... 1 once daily and 1 extra if wt is over 196    Diltiazem Hcl Er Beads 240 Mg Xr24h-cap (Diltiazem hcl er beads) .[name]... 1 tablet by mouth Increased his Lisinopril to20mg  two times a day and he has appt with cardiology in roughly a month for further evaluation  Problem # 2:  DM (ICD-250.00)  His updated medication list for this problem includes:    Lisinopril 20 Mg Tabs (Lisinopril) .[name]... 1 tab by mouth bid    Metformin Hcl 500 Mg Tabs (Metformin hcl) .[name]... 1 four times a day    Diabeta 5 Mg Tabs (Glyburide) .[name]... 1 tab by mouth qd  Orders: Podiatry Referral (Podiatry) HGBA1C is up but his FSG are  improving. Will not change meds but he will watch his diet and sugars closely if they trned up again will call us to have meds altered  Problem # 3:  NEUROMA (ICD-215.9)  Orders: Podiatry Referral (Podiatry) Referred to Podiatry for diabetic foot exam and lesion on base of left foot, at base or arch suggestive of a neuroma  Problem # 4:  BENIGN  PROSTATIC HYPERTROPHY, HX OF (ICD-V13.8) Will f/u with urology no change to therapy today  Problem # 5:  ATRIAL FLUTTER (ICD-427.32)  His updated medication list for this problem includes:    Digoxin 0.25 Mg Tabs (Digoxin) .[name]... 1/2 once daily    Coumadin 5 Mg Tabs (Warfarin sodium) .[name]... Per anticoagulation clinic    Diltiazem Hcl Er Beads 240 Mg Xr24h-cap (Diltiazem hcl er beads) .[name]... 1 tablet by mouth Regular rhythm today  Problem # 6:  DYSPNEA (ICD-786.05) Likely multifactorial, encouraged him to proceed with PFTs   Problem # 7:  UNSPECIFIED ANEMIA (ICD-285.9) Add MVI and reevaluate at next visit  Problem # 8:  DYSLIPIDEMIA (ICD-272.4)  His updated medication list for this problem includes:    Crestor 20 Mg Tabs (Rosuvastatin  calcium) .[name]... 1 once daily Restart fish oil to address mild drop in HDL  Complete Medication List: 1)  Lisinopril 20 Mg Tabs (Lisinopril) .[name].. 1 tab by mouth bid 2)  Crestor 20 Mg Tabs (Rosuvastatin calcium) .[name].. 1 once daily 3)  Combivent 18-103 Mcg/act Aero (Ipratropium-albuterol) .... 2 puffs every 4 hours as needed 4)  Digoxin 0.25 Mg Tabs (Digoxin) .... 1/2 once daily 5)  Furosemide 40 Mg Tabs (Furosemide) .[name].. 1 once daily and 1 extra if wt is over 196 6)  Metformin Hcl 500 Mg Tabs (Metformin hcl) .[name].. 1 four times a day 7)  Prilosec 40 Mg Cpdr (Omeprazole) .[name].. 1 once daily 8)  Temazepam 15 Mg Caps (Temazepam) .[name].. 1 at bedtime as needed 9)  Coumadin 5 Mg Tabs (Warfarin sodium) .... Per anticoagulation clinic 10)  Potassium Chloride Crys Cr 20 Meq Cr-tabs (Potassium chloride crys cr) .[name].. 1 tab by mouth 11)  Diltiazem Hcl Er Beads 240 Mg Xr24h-cap (Diltiazem hcl er beads) .[name].. 1 tablet by mouth 12)  Diabeta 5 Mg Tabs (Glyburide) .[name].. 1 tab by mouth qd  Patient Instructions: 1)  Please schedule a follow-up appointment in 3 months .  2)  It is important that you exercise reguarly at least 20 minutes 5 times a week. If you develop chest pain,  have severe difficulty breathing, or feel very tired, stop exercising immediately and seek medical attention.  3)  Check your blood sugars regularly. If your readings are usually above:  or below 70 you should contact our office.  4)  Check your feet each night  for sore areas, calluses or signs of infection.  5)  BMP prior to visit, ICD-9: 250.0 6)  Hepatic Panel prior to visit ICD-9: 272.0 7)  Lipid panel prior to visit ICD-9 : 272.0 8)  TSH prior to visit ICD-9 : 780.79 9)  CBC w/ Diff prior to visit ICD-9 :  10)  HgBA1c prior to visit  ICD-9:  Prescriptions: LISINOPRIL 20 MG TABS (LISINOPRIL) 1 tab by mouth bid  #60 x 3   Entered and Authorized by:   Danise Edge MD   Signed by:   Danise Edge MD on 01/05/2010   Method used:   Electronically to        [facility]  [address] (retail)       [address], [address]       [place], [place]  [address]       Ph: [phone]       Fax: [phone]   RxID:   [id number]

## 2010-07-08 ENCOUNTER — Ambulatory Visit: Admit: 2010-07-08 | Payer: Self-pay | Admitting: Pulmonary Disease

## 2010-07-08 ENCOUNTER — Ambulatory Visit: Payer: Self-pay | Admitting: Pulmonary Disease

## 2010-07-09 ENCOUNTER — Telehealth: Payer: Self-pay | Admitting: Pulmonary Disease

## 2010-07-09 NOTE — Medication Information (Signed)
Summary: [facility]   Imported By: Lester Waldorf 06/15/2010 09:39:31  _____________________________________________________________________  External Attachment:    Type:   Image     Comment:   External Document

## 2010-07-09 NOTE — Assessment & Plan Note (Addendum)
Summary: sleep study f/u ///kp   Visit Type:  Follow-up Copy to:  pcp Primary Provider/Referring Provider:  Danise Edge MD, Ramaswamy => Pulmonary, Dr. Croitoru=>Cards  CC:  Follow up. Has been using CPAP x 1 week and states wakes up feeling rested. Sleeping appro 5 to 6 hours each night.  History of Present Illness: 60/M for FU of insomnia & severe obstructive sleep apnea  He has atrial fibrillation & non ischemic cardiomyopathy with EF 40%. He has gained 40 lbs, all his prbs started since since may'11  He has longstanding insomnia, witnessed apneas, loud snoring, excessive daytime  fatigue, and non-refreshing and restless sleep.   At the time of the study, he weighed 225 pounds with  a height of 5 feet and 11 inches, BMI of 31, neck size of 14 inches worked 3 rd shift x 6 yrs, last worked 3-10p He reports sleep x 4-5 hrs only , Temazepam prescribed, uses once/ week, 15 mg not helping much  Drinks 1 cup coffee/d  Epworth Sleepiness Score 1 PSG dec '11>>  Sleep was very  fragmented due to frequent arousals and awakenings, especially in the  second half of the night with CPAP.  He was desensitized with a standard nasal mask.  Note that he took 2 tablets of temazepam of 15 mg prior to  the study.  Severe obstructive sleep apnea with hypopneas causing sleep fragmentation and oxygen desaturation- AHI was 42/h with lowest desatn of 86 % This seems to be corrected by CPAP of 8 cm with a nasal mask.  Poor sleep efficiency which is consistent with the patient's  history of insomnia of sleep maintenance. >> started on ambien & CPAP  June 10, 2010 3:10 PM  able to get 5-6 hrs of sleep - has not slept 'so much in yrs', able to use nasal cpap x 1 week, feels rested , mask ok, pressure ok, occ dryness  due to open mouth, c/o nasal pressure sore    Preventive Screening-Counseling & Management  Alcohol-Tobacco     Alcohol drinks/day: 0     Smoking Status: quit > 6 months     Year Started: 1975   Year Quit: 1983     Pack years: 4  Current Medications (verified): 1)  Lisinopril 20 Mg Tabs (Lisinopril) .... Take 1 Tablet By Mouth Two Times A Day 2)  Crestor 20 Mg Tabs (Rosuvastatin Calcium) .[name].. 1 Once Daily 3)  Combivent 18-103 Mcg/act Aero (Ipratropium-Albuterol) .... 2 Puffs Every 4 Hours As Needed 4)  Furosemide 40 Mg Tabs (Furosemide) .... 1/2 Once Daily and 1 Extra If Wt Is Over 196 5)  Metformin Hcl 1000 Mg Tabs (Metformin Hcl) .[name].. 1 Tablet Two Times A Day 6)  Prilosec 40 Mg Cpdr (Omeprazole) .[name].. 1 Once Daily 7)  Aspir-Low 81 Mg Tbec (Aspirin) .... Take 1 Tablet By Mouth Once A Day 8)  Potassium Chloride Crys Cr 20 Meq Cr-Tabs (Potassium Chloride Crys Cr) .... 1/2 Tab By Mouth 9)  Diltiazem Hcl Er Beads 240 Mg Xr24h-Cap (Diltiazem Hcl Er Beads) .[name].. 1 Tablet By Mouth 10)  Diabeta 5 Mg Tabs (Glyburide) .[name].. 1 Tab By Mouth Qd 11)  Meloxicam 7.5 Mg Tabs (Meloxicam) .[name].. 1 Tab By Mouth Two Times A Day As Needed Pain With Food 12)  Colcrys 0.6 Mg Tabs (Colchicine) .[name].. 1 Tab By Mouth Q 2 Hours As Needed Pain To A Max of 6 Tabs/24hr, Pain Relief or Diarrhea 13)  Metoprolol Succinate 25 Mg Xr24h-Tab (Metoprolol Succinate) .Marland KitchenMarland KitchenMarland Kitchen  Take 1 Tablet By Mouth Two Times A Day 14)  Multivitamins   Tabs (Multiple Vitamin) .... Take 1 Tablet By Mouth Once A Day 15)  Fish Oil  Oil (Fish Oil) .... Take 1 Capsule By Mouth Once A Day 16)  Ambien 10 Mg Tabs (Zolpidem Tartrate) .... At Bedtime As Needed  Allergies (verified): 1)  ! Sulfa  Past History:  Past Medical History: Last updated: 03/30/2010 Hypertension EMG/NCS 01/17/2006 .Marland KitchenMarland KitchenDr Anne Hahn > Suuggestive of diabetic neuropathy #Cough April 2007 REolsved  - CT chest 09/20/2005 - No hilar or lung mass (prominent hilum on CXR was due to pulmonary vessels)  -> was given Singulair by Dr [name] in 2004. 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   Quit in 1982.  Smoked approx 10 yrs up to 1/21 ppd. No ETOH  Past Pulmonary History:  Pulmonary History: admission 10/13/2009- 10/21/2009  1. Atrial fibrillation with rapid ventricular response, right middle       lobe pneumonia, possible source.   2. Chest pain secondary to pneumonia.   3. Moderate hypoxic respiratory failure secondary to pneumonia and       heart failure.   4. Type 2 diabetes.   5. Hypertension.   6. Status post cardiac catheterization on Oct 16, 2009.   7. Nonischemic cardiomyopathy.      PROCEDURES:   1. Cardiac catheterization on Oct 16, 2009 for chest pain, congestive       heart failure, new-onset atrial flutter, shows an EF of 40-45%,       nonischemic cardiomyopathy with mildly depressed left ventricular       systolic function but severe diastolic dysfunction with elevated       filling pressures, possible tachycardia related cardiomyopathy       related to atrial flutter versus acute parapneumonic myocarditis,       stress-induced cardiomyopathy.   2. Status post synchronized direct cardioversion on Oct 13, 2009.   3. Chest x-ray on May 9th shows right middle lobe pneumonia.  Chest x-       ray on the 10th shows increase in right lung base, airspace       disease, cardiomegaly with pulmonary vascular congestion.   4. CT angio on the 14th shows right-sided airspace disease consistent       with infection, small right-sided pleural effusion, cardiomegaly,       enlargement of the pulmonary outflow track suggestive of pulmonary       arterial hypertension.  Last chest x-ray of May 15th shows right       middle lobe pneumonia.      Pulmonary History-continued: DISCHARGE DIAGNOSES: 5/22/20211 - 10/28/2009  1. Hypoglycemia in the setting of diabetes secondary to Amaryl plus       metformin.   2. Hypertension.   3. Dyslipidemia.   4. Atrial fibrillation on amiodarone, digoxin, diltiazem and Coumadin.   5. Diarrhea. C. diff negative, resolved.   6.  Microcytic anemia, needing outpatient followup.   7. Chronic systolic heart failure.   8. Nonischemic cardiomyopathy, ejection fraction last one was 35%.   Review of Systems  The patient denies anorexia, fever, weight loss, weight gain, vision loss, decreased hearing, hoarseness, chest pain, syncope, dyspnea on exertion, peripheral edema, prolonged cough, headaches, hemoptysis, abdominal pain, melena, hematochezia, severe indigestion/heartburn, hematuria, muscle weakness, suspicious skin lesions, transient blindness, difficulty walking, depression, unusual weight change, abnormal bleeding, enlarged lymph nodes, and  angioedema.    Vital Signs:  Patient profile:   60 year old male Height:      70 inches Weight:      231 pounds BMI:     33.26 O2 Sat:      96 % on Room air Temp:     97.6 degrees F oral Pulse rate:   107 / minute BP sitting:   150 / 84  (left arm) Cuff size:   regular  Vitals Entered By: Zackery Barefoot CMA (June 10, 2010 3:03 PM)  O2 Flow:  Room air CC: Follow up. Has been using CPAP x 1 week, states wakes up feeling rested. Sleeping appro 5 to 6 hours each night Comments Medications reviewed with patient Verified contact number and pharmacy with patient Zackery Barefoot CMA  June 10, 2010 3:03 PM    Physical Exam  Additional Exam:  Gen. Pleasant, well-nourished, in no distress, normal affect ENT - no lesions, no post nasal drip, class 2 airway Neck: No JVD, no thyromegaly, no carotid bruits Lungs: no use of accessory muscles, no dullness to percussion, clear without rales or rhonchi  Cardiovascular: Rhythm regular, heart sounds  normal, no murmurs or gallops, no peripheral edema Musculoskeletal: No deformities, no cyanosis or clubbing      Impression & Recommendations:  Problem # 1:  SLEEP APNEA (ICD-780.57)  Compliance encouraged, wt loss emphasized, asked to avoid meds with sedative side effects, cautioned against driving when sleepy.  Review  download & provide feedback in 1 month Nasal pads  Orders: Est. Patient Level III (41660)  Problem # 2:  INSOMNIA, HX OF (ICD-V15.89)  much improved with Remus Loffler - he will transition to every other day in 1 month cognitive behaviour techniques emphasised  Orders: Est. Patient Level III (63016)  Medications Added to Medication List This Visit: 1)  Ambien 10 Mg Tabs (Zolpidem tartrate) .... At bedtime as needed  Patient Instructions: 1)  Copy sent to: Dr Rogelia Rohrer 2)  Please schedule a follow-up appointment in 2 months. 3)  Check with advance at end month for download  4)  Ask them about nasal strips to prevent sore  Prescriptions: AMBIEN 10 MG TABS (ZOLPIDEM TARTRATE) at bedtime as needed  #30 x 0   Entered and Authorized by:   Comer Locket Vassie Loll MD   Signed by:   Comer Locket Vassie Loll MD on 06/10/2010   Method used:   Print then Mail to Patient   RxID:   [id number]   Appended Document: sleep study f/u ///kp download 12/28 -07/02/10 >> god compliance avg 6.5h, AHI 15/h, centrals 9/h, obstructive 4.5/h, avg pr 8 cm

## 2010-07-09 NOTE — Assessment & Plan Note (Signed)
Summary: followup PGS//ok per RA//lmr   Visit Type:  Follow-up Copy to:  pcp Primary Javonne Dorko/Referring Sahirah Rudell:  Danise Edge MD, Ramaswamy => Pulmonary, Dr. Croitoru=>Cards  CC:  Sleep study results and requesting copy of sleep study and stress test.  History of Present Illness: 59/M for evaluation of hypersomnia, fatigue, and pt's wife witnessing apnea episodes at night.  he is undergoing dyspnea evaluation by Dr Marchelle Gearing & a CPST has been scheduled. He has atrial fibrillation & non ischemic cardiomyopathy with EF 40%. He has gained 40 lbs, all his prbs started since since may'11  He has longstanding insomnia, witnessed apneas, loud snoring, excessive daytime  fatigue, and non-refreshing and restless sleep.   At the time of the study, he weighed 225 pounds with  a height of 5 feet and 11 inches, BMI of 31, neck size of 14 inches worked 3 rd shift x 6 yrs, last worked 3-10p He reports sleep x 4-5 hrs only , Temazepam prescribed, uses once/ week, 15 mg not helping much  Drinks 1 cup coffee/d  Epworth Sleepiness Score 1  May 27, 2010 2:01 PM  sleep efficiency was very poor.  Sleep was very  fragmented due to frequent arousals and awakenings, especially in the  second half of the night with CPAP.  He was desensitized with a standard   nasal mask.  Note that he took 2 tablets of temazepam of 15 mg prior to  the study.  Severe obstructive sleep apnea with hypopneas causing sleep fragmentation and oxygen desaturation- AHI was 42/h with lowest desatn of 86 % This seems to be corrected by CPAP of 8 cm with a nasal mask.  Poor sleep efficiency which is consistent with the patient's  history of insomnia of sleep maintenance.    Current Medications (verified): 1)  Lisinopril 20 Mg Tabs (Lisinopril) .... Take 1 Tablet By Mouth Two Times A Day 2)  Crestor 20 Mg Tabs (Rosuvastatin Calcium) .[name].. 1 Once Daily 3)  Combivent 18-103 Mcg/act Aero (Ipratropium-Albuterol) .... 2 Puffs Every 4 Hours As  Needed 4)  Furosemide 40 Mg Tabs (Furosemide) .... 1/2 Once Daily and 1 Extra If Wt Is Over 196 5)  Metformin Hcl 1000 Mg Tabs (Metformin Hcl) .[name].. 1 Tablet Two Times A Day 6)  Prilosec 40 Mg Cpdr (Omeprazole) .[name].. 1 Once Daily 7)  Temazepam 15 Mg Caps (Temazepam) .[name].. 1 At Bedtime As Needed 8)  Aspir-Low 81 Mg Tbec (Aspirin) .... Take 1 Tablet By Mouth Once A Day 9)  Potassium Chloride Crys Cr 20 Meq Cr-Tabs (Potassium Chloride Crys Cr) .... 1/2 Tab By Mouth 10)  Diltiazem Hcl Er Beads 240 Mg Xr24h-Cap (Diltiazem Hcl Er Beads) .[name].. 1 Tablet By Mouth 11)  Diabeta 5 Mg Tabs (Glyburide) .[name].. 1 Tab By Mouth Qd 12)  Meloxicam 7.5 Mg Tabs (Meloxicam) .[name].. 1 Tab By Mouth Two Times A Day As Needed Pain With Food 13)  Colcrys 0.6 Mg Tabs (Colchicine) .[name].. 1 Tab By Mouth Q 2 Hours As Needed Pain To A Max of 6 Tabs/24hr, Pain Relief or Diarrhea 14)  Metoprolol Succinate 25 Mg Xr24h-Tab (Metoprolol Succinate) .... Take 1 Tablet By Mouth Two Times A Day 15)  Multivitamins   Tabs (Multiple Vitamin) .... Take 1 Tablet By Mouth Once A Day 16)  Fish Oil  Oil (Fish Oil) .... Take 1 Capsule By Mouth Once A Day  Allergies (verified): 1)  ! Sulfa  Past Pulmonary History:  Pulmonary History: admission 10/13/2009- 10/21/2009  1. Atrial fibrillation with  rapid ventricular response, right middle       lobe pneumonia, possible source.   2. Chest pain secondary to pneumonia.   3. Moderate hypoxic respiratory failure secondary to pneumonia and       heart failure.   4. Type 2 diabetes.   5. Hypertension.   6. Status post cardiac catheterization on Oct 16, 2009.   7. Nonischemic cardiomyopathy.      PROCEDURES:   1. Cardiac catheterization on Oct 16, 2009 for chest pain, congestive       heart failure, new-onset atrial flutter, shows an EF of 40-45%,       nonischemic cardiomyopathy with mildly depressed left ventricular       systolic function but severe diastolic dysfunction with elevated       filling  pressures, possible tachycardia related cardiomyopathy       related to atrial flutter versus acute parapneumonic myocarditis,       stress-induced cardiomyopathy.   2. Status post synchronized direct cardioversion on Oct 13, 2009.   3. Chest x-ray on May 9th shows right middle lobe pneumonia.  Chest x-       ray on the 10th shows increase in right lung base, airspace       disease, cardiomegaly with pulmonary vascular congestion.   4. CT angio on the 14th shows right-sided airspace disease consistent       with infection, small right-sided pleural effusion, cardiomegaly,       enlargement of the pulmonary outflow track suggestive of pulmonary       arterial hypertension.  Last chest x-ray of May 15th shows right       middle lobe pneumonia.      Pulmonary History-continued: DISCHARGE DIAGNOSES: 5/22/20211 - 10/28/2009  1. Hypoglycemia in the setting of diabetes secondary to Amaryl plus       metformin.   2. Hypertension.   3. Dyslipidemia.   4. Atrial fibrillation on amiodarone, digoxin, diltiazem and Coumadin.   5. Diarrhea. C. diff negative, resolved.   6. Microcytic anemia, needing outpatient followup.   7. Chronic systolic heart failure.   8. Nonischemic cardiomyopathy, ejection fraction last one was 35%.   Review of Systems       The patient complains of dyspnea on exertion.  The patient denies anorexia, fever, weight loss, weight gain, vision loss, decreased hearing, hoarseness, chest pain, syncope, peripheral edema, prolonged cough, headaches, hemoptysis, abdominal pain, melena, hematochezia, severe indigestion/heartburn, hematuria, muscle weakness, suspicious skin lesions, difficulty walking, depression, unusual weight change, abnormal bleeding, enlarged lymph nodes, and angioedema.    Vital Signs:  Patient profile:   60 year old male Height:      70 inches Weight:      233.8 pounds BMI:     33.67 O2 Sat:      98 % on Room air Temp:     98.2 degrees F oral Pulse rate:    119 / minute BP sitting:   152 / 84  (right arm) Cuff size:   large  Vitals Entered By: Zackery Barefoot CMA (May 27, 2010 1:48 PM)  O2 Flow:  Room air CC: Sleep study results and requesting copy of sleep study and stress test Comments Medications reviewed with patient Verified contact number and pharmacy with patient Zackery Barefoot CMA  May 27, 2010 1:48 PM    Physical Exam  Additional Exam:  Gen. Pleasant, well-nourished, in no distress, normal affect ENT - no lesions, no post nasal drip, class 2 airway  Neck: No JVD, no thyromegaly, no carotid bruits Lungs: no use of accessory muscles, no dullness to percussion, clear without rales or rhonchi  Cardiovascular: Rhythm regular, heart sounds  normal, no murmurs or gallops, no peripheral edema Musculoskeletal: No deformities, no cyanosis or clubbing      Impression & Recommendations:  Problem # 1:  SLEEP APNEA (ICD-780.57) CPAP will be initiated at 8 cm with nasal mask, heated humidity & EPR 2  Compliance encouraged, wt loss emphasized, asked to avoid meds with sedative side effects, cautioned against driving when sleepy.  Download will be reviewed Orders: Est. Patient Level IV (56387) DME Referral (DME)  Problem # 2:  INSOMNIA, HX OF (ICD-V15.89) OK to use ambien. obstructive sleep apnea somewaht contributing due to repeated arousals. Discussed cognitive bahviour techniques  Orders: Est. Patient Level IV (56433) DME Referral (DME)  Medications Added to Medication List This Visit: 1)  Lisinopril 20 Mg Tabs (Lisinopril) .... Take 1 tablet by mouth two times a day 2)  Furosemide 40 Mg Tabs (Furosemide) .... 1/2 once daily and 1 extra if wt is over 196 3)  Potassium Chloride Crys Cr 20 Meq Cr-tabs (Potassium chloride crys cr) .... 1/2 tab by mouth 4)  Metoprolol Succinate 25 Mg Xr24h-tab (Metoprolol succinate) .... Take 1 tablet by mouth two times a day 5)  Multivitamins Tabs (Multiple vitamin) .... Take 1 tablet  by mouth once a day 6)  Fish Oil Oil (Fish oil) .... Take 1 capsule by mouth once a day 7)  Ambien 10 Mg Tabs (Zolpidem tartrate) .... At bedtime as needed  Patient Instructions: 1)  Please schedule a follow-up appointment in 1 month. 2)  Copy sent to: 3)  CPAP will be set up , send in the card in 4 weeks 4)  Ambien Rx  - use after you are done with temazepam 15 mg Prescriptions: AMBIEN 10 MG TABS (ZOLPIDEM TARTRATE) at bedtime as needed  #20 x 0   Entered and Authorized by:   Comer Locket Vassie Loll MD   Signed by:   Comer Locket Vassie Loll MD on 05/27/2010   Method used:   Print then Give to Patient   RxID:   [id number]

## 2010-07-09 NOTE — Progress Notes (Signed)
Summary: pt wants to R/S CPST  Phone Note Call from Patient   Caller: Patient Call For: Dr Marchelle Gearing Summary of Call: Pt stated he went for his appt at [facility] for his CPST and that they wouldn't complete the study due to a resting heart rate in the 120's. Pt stated that he checked with his cardiologist at [facility] and he advised pt to go ahead and schedule the study again.  Please advise if you want [place] to proceed and r/s this appt at [facility]. Thanks,  Initial call taken by: Alfonso Ramus,  May 12, 2010 10:30 AM  Follow-up for Phone Call        I have emailed Laymond Purser as such. I will wait to find out Follow-up by: Kalman Shan MD,  May 18, 2010 5:39 PM     Appended Document: pt wants to R/S CPST d/w Dr Sabino Niemann of [facility] today. He states patient always been in sinus so he wants the test. I have emailed Mr Almeta Monas to get it coordinated before 05/29/2010 if possible

## 2010-07-09 NOTE — Progress Notes (Signed)
Summary: needs earlier appt  Phone Note Call from Patient Call back at Home Phone [phone]   Caller: Patient Call For: alva Summary of Call: pt wants to return for sleep study f/u before the end of dec (so he doesn't have to pay a new deductable). i made him a f/u for 06/10/10 as this was his 1st avail but he wants to move this in to dec/ 2011. ALSO pt wants to make sure that his pulm/cardio stress test is scheduled in dec as well.  Initial call taken by: Tivis Ringer, CNA,  May 08, 2010 4:51 PM  Follow-up for Phone Call        [facility] x 1. Looks like pt was scheduled for CPST 04/20/10. Need to know why pt did not go, and per RA ok for pt to come in 1 week after sleep study which is on 05/20/10. Zackery Barefoot CMA  May 08, 2010 5:08 PM   Additional Follow-up for Phone Call Additional follow up Details #1::        Spoke with pt.  I advised okay per RA to move up rov with him for 1 wk after sleep study- appt sched for 05/27/10 at 1:30 pm  Pt states that he had to cancel CPST due to heart racing when they first started the test.  He states that the tech had called and spoke with MR and he wanted it canceled.  Pt wants to know if okay to go ahead and resched this, pls advise thanks! Additional Follow-up by: Vernie Murders,  May 11, 2010 11:47 AM    Additional Follow-up for Phone Call Additional follow up Details #2::    tech told me he had A FIB and that was cause for dyspnea.I will review again with tech. And if we need repeat CPST will try to make it happen this month Follow-up by: Kalman Shan MD,  May 11, 2010 5:36 PM  Additional Follow-up for Phone Call Additional follow up Details #3:: Details for Additional Follow-up Action Taken: called spoke with patient.  advised of MR's recs as stated above.  pt verbalized his understanding.  will hold to MR's inbox as reminder about CPST. Boone Master CNA/MA  May 12, 2010 8:56 AM   spoke to patient. I have  emailed Laymond Purser to see if he can reattempt test. Waiting to hear Kalman Shan MD  May 18, 2010 5:39 PM

## 2010-07-09 NOTE — Progress Notes (Signed)
Summary: cpst results  Phone Note Outgoing Call   Summary of Call: gave CPST results. REsuls show a) circulatory impairment - likely from CHF and hypertensive BP response, b)no evidence of pulmnary issues. REcommended he control BP, and attend pulmonary/cardiac rehab. b) he wants copy of ttest result - have told him to call and get if from Carron Curie starting 05/27/2010. c) Jen please fax results to Dr. Bronson Curb at [facility] Initial call taken by: Kalman Shan MD,  May 26, 2010 12:53 PM  Follow-up for Phone Call        Copy of results has been faxed to Dr. Sabino Niemann and a copy has been mailed to the pt as well.  Carron Curie CMA  May 27, 2010 10:24 AM

## 2010-07-13 ENCOUNTER — Ambulatory Visit (HOSPITAL_COMMUNITY): Payer: Self-pay

## 2010-07-15 ENCOUNTER — Encounter: Payer: Self-pay | Admitting: Family Medicine

## 2010-07-15 NOTE — Progress Notes (Signed)
Summary: Crestor Prior [employer] Call   Call placed by: Lannette Donath,  July 02, 2010 3:49 PM Summary of Call: PA Crestor called [facility] [phone] to request prior auth form, received fax, given to doctor to complete Initial call taken by: Lannette Donath,  July 02, 2010 3:53 PM  Follow-up for Phone Call        please call about PA, note that patient has a history of cardiomyopathy and CAD and DM, CHF and atrial Flutter needs to stay on Crestor for its superior efficacy Follow-up by: Danise Edge MD,  July 03, 2010 4:57 PM  Additional Follow-up for Phone Call Additional follow up Details #1::        Ladie at [employer] states the paperwork needs to be filled out and faxed back. Additional Follow-up by: Josph Macho RMA,  July 06, 2010 9:35 AM    Additional Follow-up for Phone Call Additional follow up Details #2::    Paperwork completed and faxed. Also a copy went to be scanned. Follow-up by: Josph Macho RMA,  July 06, 2010 4:43 PM

## 2010-07-15 NOTE — Progress Notes (Signed)
Summary: nos appt  Phone Note Call from Patient   Caller: [email or web address]  Call For: Liana Camerer Summary of Call: Rsc nos from 2/1 to 2/16. Initial call taken by: Darletta Moll,  July 09, 2010 10:04 AM

## 2010-07-17 ENCOUNTER — Encounter (INDEPENDENT_AMBULATORY_CARE_PROVIDER_SITE_OTHER): Payer: Self-pay | Admitting: *Deleted

## 2010-07-20 ENCOUNTER — Encounter (HOSPITAL_COMMUNITY): Payer: BC Managed Care – PPO | Attending: Internal Medicine

## 2010-07-20 DIAGNOSIS — D219 Benign neoplasm of connective and other soft tissue, unspecified: Secondary | ICD-10-CM | POA: Insufficient documentation

## 2010-07-20 DIAGNOSIS — R0609 Other forms of dyspnea: Secondary | ICD-10-CM | POA: Insufficient documentation

## 2010-07-20 DIAGNOSIS — R942 Abnormal results of pulmonary function studies: Secondary | ICD-10-CM | POA: Insufficient documentation

## 2010-07-20 DIAGNOSIS — E119 Type 2 diabetes mellitus without complications: Secondary | ICD-10-CM | POA: Insufficient documentation

## 2010-07-20 DIAGNOSIS — I5032 Chronic diastolic (congestive) heart failure: Secondary | ICD-10-CM | POA: Insufficient documentation

## 2010-07-20 DIAGNOSIS — R0989 Other specified symptoms and signs involving the circulatory and respiratory systems: Secondary | ICD-10-CM | POA: Insufficient documentation

## 2010-07-20 DIAGNOSIS — I359 Nonrheumatic aortic valve disorder, unspecified: Secondary | ICD-10-CM | POA: Insufficient documentation

## 2010-07-20 DIAGNOSIS — G4733 Obstructive sleep apnea (adult) (pediatric): Secondary | ICD-10-CM | POA: Insufficient documentation

## 2010-07-20 DIAGNOSIS — I509 Heart failure, unspecified: Secondary | ICD-10-CM | POA: Insufficient documentation

## 2010-07-20 DIAGNOSIS — Z8701 Personal history of pneumonia (recurrent): Secondary | ICD-10-CM | POA: Insufficient documentation

## 2010-07-20 DIAGNOSIS — I4891 Unspecified atrial fibrillation: Secondary | ICD-10-CM | POA: Insufficient documentation

## 2010-07-20 DIAGNOSIS — Z5189 Encounter for other specified aftercare: Secondary | ICD-10-CM | POA: Insufficient documentation

## 2010-07-20 DIAGNOSIS — I10 Essential (primary) hypertension: Secondary | ICD-10-CM | POA: Insufficient documentation

## 2010-07-20 DIAGNOSIS — I4892 Unspecified atrial flutter: Secondary | ICD-10-CM | POA: Insufficient documentation

## 2010-07-20 DIAGNOSIS — I428 Other cardiomyopathies: Secondary | ICD-10-CM | POA: Insufficient documentation

## 2010-07-21 ENCOUNTER — Encounter (HOSPITAL_COMMUNITY): Payer: BC Managed Care – PPO

## 2010-07-21 ENCOUNTER — Encounter (HOSPITAL_COMMUNITY)
Admission: RE | Admit: 2010-07-21 | Discharge: 2010-07-21 | Disposition: A | Discharge status: Home / Self Care | Payer: BC Managed Care – PPO | Source: Ambulatory Visit | Attending: Internal Medicine | Admitting: Internal Medicine

## 2010-07-21 DIAGNOSIS — R942 Abnormal results of pulmonary function studies: Secondary | ICD-10-CM | POA: Insufficient documentation

## 2010-07-21 DIAGNOSIS — I4892 Unspecified atrial flutter: Secondary | ICD-10-CM | POA: Insufficient documentation

## 2010-07-21 DIAGNOSIS — Z8701 Personal history of pneumonia (recurrent): Secondary | ICD-10-CM | POA: Insufficient documentation

## 2010-07-21 DIAGNOSIS — Z5189 Encounter for other specified aftercare: Secondary | ICD-10-CM | POA: Insufficient documentation

## 2010-07-21 DIAGNOSIS — E119 Type 2 diabetes mellitus without complications: Secondary | ICD-10-CM | POA: Insufficient documentation

## 2010-07-21 DIAGNOSIS — R0609 Other forms of dyspnea: Secondary | ICD-10-CM | POA: Insufficient documentation

## 2010-07-21 DIAGNOSIS — I4891 Unspecified atrial fibrillation: Secondary | ICD-10-CM | POA: Insufficient documentation

## 2010-07-21 DIAGNOSIS — I428 Other cardiomyopathies: Secondary | ICD-10-CM | POA: Insufficient documentation

## 2010-07-21 DIAGNOSIS — I10 Essential (primary) hypertension: Secondary | ICD-10-CM | POA: Insufficient documentation

## 2010-07-21 DIAGNOSIS — I509 Heart failure, unspecified: Secondary | ICD-10-CM | POA: Insufficient documentation

## 2010-07-21 DIAGNOSIS — I359 Nonrheumatic aortic valve disorder, unspecified: Secondary | ICD-10-CM | POA: Insufficient documentation

## 2010-07-21 DIAGNOSIS — I5032 Chronic diastolic (congestive) heart failure: Secondary | ICD-10-CM | POA: Insufficient documentation

## 2010-07-21 DIAGNOSIS — R0989 Other specified symptoms and signs involving the circulatory and respiratory systems: Secondary | ICD-10-CM | POA: Insufficient documentation

## 2010-07-21 DIAGNOSIS — D219 Benign neoplasm of connective and other soft tissue, unspecified: Secondary | ICD-10-CM | POA: Insufficient documentation

## 2010-07-21 DIAGNOSIS — G4733 Obstructive sleep apnea (adult) (pediatric): Secondary | ICD-10-CM | POA: Insufficient documentation

## 2010-07-23 ENCOUNTER — Encounter (HOSPITAL_COMMUNITY): Payer: BC Managed Care – PPO

## 2010-07-23 ENCOUNTER — Ambulatory Visit (INDEPENDENT_AMBULATORY_CARE_PROVIDER_SITE_OTHER): Payer: BC Managed Care – PPO | Admitting: Pulmonary Disease

## 2010-07-23 ENCOUNTER — Encounter: Payer: Self-pay | Admitting: Pulmonary Disease

## 2010-07-23 DIAGNOSIS — G4730 Sleep apnea, unspecified: Secondary | ICD-10-CM

## 2010-07-23 NOTE — Letter (Signed)
Summary: [facility]  [facility]   Imported By: Kassie Mends 07/16/2010 09:33:26  _____________________________________________________________________  External Attachment:    Type:   Image     Comment:   External Document

## 2010-07-23 NOTE — Medication Information (Signed)
Summary: Prior Authorization  Prior Authorization   Imported By: Kassie Mends 07/16/2010 09:45:12  _____________________________________________________________________  External Attachment:    Type:   Image     Comment:   External Document

## 2010-07-23 NOTE — Miscellaneous (Signed)
Summary: New cholesterol med  Clinical Lists Changes  Medications: Added new medication of LOVASTATIN 20 MG TABS (LOVASTATIN) by mouth daily

## 2010-07-24 ENCOUNTER — Encounter: Payer: Self-pay | Admitting: Family Medicine

## 2010-07-28 ENCOUNTER — Encounter (HOSPITAL_COMMUNITY): Payer: BC Managed Care – PPO

## 2010-07-29 NOTE — Assessment & Plan Note (Addendum)
Summary: rov   Visit Type:  Follow-up Copy to:  pcp Primary Provider/Referring Provider:  Danise Edge MD, Ramaswamy => Pulmonary, Dr. Croitoru=>Cards  CC:  Pt c/o being fatigue during the day .  History of Present Illness: 60/M for FU of insomnia & severe obstructive sleep apnea  He has atrial fibrillation & non ischemic cardiomyopathy with EF 40%. He has gained 40 lbs, all his prbs started since since may'11  He has longstanding insomnia, witnessed apneas, loud snoring, excessive daytime  fatigue, and non-refreshing and restless sleep.   At the time of the study, he weighed 225 pounds with  a height of 5 feet and 11 inches, BMI of 31, neck size of 14 inches worked 3 rd shift x 6 yrs, last worked 3-10p [employer] dec '11>>  Sleep was very  fragmented due to frequent arousals and awakenings, especially in the  second half of the night with CPAP.  He was desensitized with a standard nasal mask.  Note that he took 2 tablets of temazepam of 15 mg prior to  the study.  Severe obstructive sleep apnea >>AHI was 42/h with lowest desatn of 86 % >>corrected by CPAP of 8 cm with a nasal mask.  Poor sleep efficiency which is consistent with the patient's  history of insomnia of sleep maintenance. >> started on ambien & CPAP  June 10, 2010  able to get 5-6 hrs of sleep - has not slept 'so much in yrs', able to use nasal cpap x 1 week, feels rested , mask ok, pressure ok, occ dryness  due to open mouth, c/o nasal pressure sore download 12/28 -07/02/10 >> god compliance avg 6.5h, AHI 15/h, centrals 9/h, obstructive 4.5/h, avg pr 8 cm  July 23, 2010 1:46 PM  started pulm rehab, still feels tired, pressure OK, is needing 1/2 tab of ambien (5 mg), getting 4-5 h of sleep, trying to go to bed earlier download 12/28 -07/02/10 >> good compliance avg 6.5h, AHI 15/h, centrals 9/h, obstructive 4.5/h, avg pr 8 cm  Note on lisinopril - c/o occ cough & wheezing   Preventive Screening-Counseling &  Management  Alcohol-Tobacco     Alcohol drinks/day: 0     Smoking Status: quit > 6 months     Year Started: 1975     Year Quit: 1983     Pack years: 4  Current Medications (verified): 1)  Lisinopril 20 Mg Tabs (Lisinopril) .... Take 1 Tablet By Mouth Two Times A Day 2)  Crestor 20 Mg Tabs (Rosuvastatin Calcium) .[name].. 1 Once Daily 3)  Combivent 18-103 Mcg/act Aero (Ipratropium-Albuterol) .... 2 Puffs Every 4 Hours As Needed 4)  Furosemide 40 Mg Tabs (Furosemide) .... 1/2 Once Daily and 1 Extra If Wt Is Over 196 5)  Metformin Hcl 1000 Mg Tabs (Metformin Hcl) .[name].. 1 Tablet Two Times A Day 6)  Prilosec 40 Mg Cpdr (Omeprazole) .[name].. 1 Once Daily 7)  Aspir-Low 81 Mg Tbec (Aspirin) .... Take 1 Tablet By Mouth Once A Day 8)  Potassium Chloride Crys Cr 20 Meq Cr-Tabs (Potassium Chloride Crys Cr) .... 1/2 Tab By Mouth 9)  Diltiazem Hcl Er Beads 240 Mg Xr24h-Cap (Diltiazem Hcl Er Beads) .[name].. 1 Tablet By Mouth 10)  Diabeta 5 Mg Tabs (Glyburide) .[name].. 1 Tab By Mouth Qd 11)  Meloxicam 7.5 Mg Tabs (Meloxicam) .[name].. 1 Tab By Mouth Two Times A Day As Needed Pain With Food 12)  Colcrys 0.6 Mg Tabs (Colchicine) .[name].. 1 Tab By Mouth Q 2 Hours  As Needed Pain To A Max of 6 Tabs/24hr, Pain Relief or Diarrhea 13)  Metoprolol Succinate 50 Mg Xr24h-Tab (Metoprolol Succinate) .... Take 1 Tablet By Mouth Two Times A Day 14)  Multivitamins   Tabs (Multiple Vitamin) .... Take 1 Tablet By Mouth Once A Day 15)  Fish Oil  Oil (Fish Oil) .... Take 1 Capsule By Mouth Once A Day 16)  Lovastatin 20 Mg Tabs (Lovastatin) .... ***not Started***by Mouth Daily 17)  Hydralazine Hcl 25 Mg Tabs (Hydralazine Hcl) .... Take 1 Tablet By Mouth Three Times A Day  Allergies (verified): 1)  ! Sulfa  Past History:  Past Medical History: Last updated: 03/30/2010 Hypertension EMG/NCS 01/17/2006 .Marland KitchenMarland KitchenDr Anne Hahn > Suuggestive of diabetic neuropathy #Cough April 2007 REolsved  - CT chest 09/20/2005 - No hilar or lung mass (prominent hilum on  CXR was due to pulmonary vessels)  -> was given Singulair by Dr [name] in 2004. 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  Quit in 1982.  Smoked approx 10 yrs up to 1/21 ppd. No ETOH  Past Pulmonary History:  Pulmonary History: admission 10/13/2009- 10/21/2009  1. Atrial fibrillation with rapid ventricular response, right middle       lobe pneumonia, possible source.   2. Chest pain secondary to pneumonia.   3. Moderate hypoxic respiratory failure secondary to pneumonia and       heart failure.   4. Type 2 diabetes.   5. Hypertension.   6. Status post cardiac catheterization on Oct 16, 2009.   7. Nonischemic cardiomyopathy.      PROCEDURES:   1. Cardiac catheterization on Oct 16, 2009 for chest pain, congestive       heart failure, new-onset atrial flutter, shows an EF of 40-45%,       nonischemic cardiomyopathy with mildly depressed left ventricular       systolic function but severe diastolic dysfunction with elevated       filling pressures, possible tachycardia related cardiomyopathy       related to atrial flutter versus acute parapneumonic myocarditis,       stress-induced cardiomyopathy.   2. Status post synchronized direct cardioversion on Oct 13, 2009.   3. Chest x-ray on May 9th shows right middle lobe pneumonia.  Chest x-       ray on the 10th shows increase in right lung base, airspace       disease, cardiomegaly with pulmonary vascular congestion.   4. CT angio on the 14th shows right-sided airspace disease consistent       with infection, small right-sided pleural effusion, cardiomegaly,       enlargement of the pulmonary outflow track suggestive of pulmonary       arterial hypertension.  Last chest x-ray of May 15th shows right       middle lobe pneumonia.      Pulmonary History-continued: DISCHARGE DIAGNOSES: 5/22/20211 - 10/28/2009  1. Hypoglycemia in the setting  of diabetes secondary to Amaryl plus       metformin.   2. Hypertension.   3. Dyslipidemia.   4. Atrial fibrillation on amiodarone, digoxin, diltiazem and Coumadin.   5. Diarrhea. C. diff negative, resolved.   6. Microcytic anemia, needing outpatient followup.   7. Chronic systolic heart failure.   8. Nonischemic cardiomyopathy, ejection fraction last one was 35%.   Review of Systems  The patient denies anorexia, fever, weight loss, weight gain, vision loss,  decreased hearing, hoarseness, chest pain, syncope, dyspnea on exertion, peripheral edema, prolonged cough, headaches, hemoptysis, abdominal pain, melena, hematochezia, severe indigestion/heartburn, hematuria, incontinence, genital sores, muscle weakness, suspicious skin lesions, transient blindness, difficulty walking, depression, unusual weight change, abnormal bleeding, enlarged lymph nodes, and angioedema.    Vital Signs:  Patient profile:   60 year old male Height:      70 inches Weight:      227.6 pounds BMI:     32.78 O2 Sat:      97 % on Room air Temp:     98.3 degrees F oral Pulse rate:   77 / minute BP sitting:   120 / 74  (left arm) Cuff size:   regular  Vitals Entered By: Zackery Barefoot CMA (July 23, 2010 1:34 PM)  O2 Flow:  Room air CC: Pt c/o being fatigue during the day  Comments Medications reviewed with patient Verified contact number and pharmacy with patient Zackery Barefoot [facility]  July 23, 2010 1:34 PM    Physical Exam  Additional Exam:  wt 228 July 23, 2010  Gen. Pleasant, well-nourished, in no distress, normal affect ENT - no lesions, no post nasal drip, class 2 airway Neck: No JVD, no thyromegaly, no carotid bruits Lungs: no use of accessory muscles, no dullness to percussion, clear without rales or rhonchi  Cardiovascular: Rhythm regular, heart sounds  normal, no murmurs or gallops, no peripheral edema Musculoskeletal: No deformities, no cyanosis or clubbing      Impression &  Recommendations:  Problem # 1:  SLEEP APNEA (ICD-780.57) Significant residual events, Increase pr to 10 cm & rechk download in 4 weeks  - COncern for increasing central events Compliance encouraged, wt loss emphasized, asked to avoid meds with sedative side effects, cautioned against driving when sleepy.  I have also asked him to wean off ambien & try melatonin instead Orders: Est. Patient Level III (16109) DME Referral (DME)  Problem # 2:  DYSPNEA (ICD-786.05) If dyspnea persists , trial of stopping lisinopril next visit  Medications Added to Medication List This Visit: 1)  Metoprolol Succinate 50 Mg Xr24h-tab (Metoprolol succinate) .... Take 1 tablet by mouth two times a day 2)  Lovastatin 20 Mg Tabs (Lovastatin) .... ***not started***by mouth daily 3)  Hydralazine Hcl 25 Mg Tabs (Hydralazine hcl) .... Take 1 tablet by mouth three times a day  Patient Instructions: 1)  Copy sent to: Dr Abner Greenspan 2)  Please schedule a follow-up appointment in 2 months. 3)  Increase pressure & recheck download in 1 month 4)  Trial of melatonin 5 mg 2-3 hr prior to bedtime 5)  WEAN off ambien   Appended Document: rov events are decreased on increasing pressure to 10 cm  Appended Document: rov Pt c/o problems falling asleep and staying sleep. States is sleeping approx 4 to 5 everynight. Only using 1/2 Ambien 10mg  on bad nights with some relief and Melatonin w/o relief.

## 2010-07-30 ENCOUNTER — Encounter (HOSPITAL_COMMUNITY): Payer: BC Managed Care – PPO

## 2010-08-04 ENCOUNTER — Encounter (HOSPITAL_COMMUNITY): Payer: BC Managed Care – PPO

## 2010-08-04 ENCOUNTER — Encounter: Payer: Self-pay | Admitting: Family Medicine

## 2010-08-04 NOTE — Medication Information (Signed)
Summary: Lovastatin/[facility]  Lovastatin/[facility]   Imported By: Lester Homer 07/27/2010 08:21:18  _____________________________________________________________________  External Attachment:    Type:   Image     Comment:   External Document

## 2010-08-06 ENCOUNTER — Encounter (HOSPITAL_COMMUNITY): Payer: BC Managed Care – PPO

## 2010-08-06 ENCOUNTER — Encounter (HOSPITAL_COMMUNITY): Payer: BC Managed Care – PPO | Attending: Internal Medicine

## 2010-08-06 DIAGNOSIS — I10 Essential (primary) hypertension: Secondary | ICD-10-CM | POA: Insufficient documentation

## 2010-08-06 DIAGNOSIS — I5032 Chronic diastolic (congestive) heart failure: Secondary | ICD-10-CM | POA: Insufficient documentation

## 2010-08-06 DIAGNOSIS — D219 Benign neoplasm of connective and other soft tissue, unspecified: Secondary | ICD-10-CM | POA: Insufficient documentation

## 2010-08-06 DIAGNOSIS — I509 Heart failure, unspecified: Secondary | ICD-10-CM | POA: Insufficient documentation

## 2010-08-06 DIAGNOSIS — R0609 Other forms of dyspnea: Secondary | ICD-10-CM | POA: Insufficient documentation

## 2010-08-06 DIAGNOSIS — Z8701 Personal history of pneumonia (recurrent): Secondary | ICD-10-CM | POA: Insufficient documentation

## 2010-08-06 DIAGNOSIS — I428 Other cardiomyopathies: Secondary | ICD-10-CM | POA: Insufficient documentation

## 2010-08-06 DIAGNOSIS — I4892 Unspecified atrial flutter: Secondary | ICD-10-CM | POA: Insufficient documentation

## 2010-08-06 DIAGNOSIS — R942 Abnormal results of pulmonary function studies: Secondary | ICD-10-CM | POA: Insufficient documentation

## 2010-08-06 DIAGNOSIS — G4733 Obstructive sleep apnea (adult) (pediatric): Secondary | ICD-10-CM | POA: Insufficient documentation

## 2010-08-06 DIAGNOSIS — Z5189 Encounter for other specified aftercare: Secondary | ICD-10-CM | POA: Insufficient documentation

## 2010-08-06 DIAGNOSIS — I359 Nonrheumatic aortic valve disorder, unspecified: Secondary | ICD-10-CM | POA: Insufficient documentation

## 2010-08-06 DIAGNOSIS — I4891 Unspecified atrial fibrillation: Secondary | ICD-10-CM | POA: Insufficient documentation

## 2010-08-06 DIAGNOSIS — R0989 Other specified symptoms and signs involving the circulatory and respiratory systems: Secondary | ICD-10-CM | POA: Insufficient documentation

## 2010-08-06 DIAGNOSIS — E119 Type 2 diabetes mellitus without complications: Secondary | ICD-10-CM | POA: Insufficient documentation

## 2010-08-11 ENCOUNTER — Encounter (HOSPITAL_COMMUNITY): Payer: BC Managed Care – PPO

## 2010-08-12 ENCOUNTER — Other Ambulatory Visit: Payer: Self-pay | Admitting: Family Medicine

## 2010-08-12 ENCOUNTER — Encounter: Payer: Self-pay | Admitting: Family Medicine

## 2010-08-12 ENCOUNTER — Ambulatory Visit (INDEPENDENT_AMBULATORY_CARE_PROVIDER_SITE_OTHER): Payer: BC Managed Care – PPO | Admitting: Family Medicine

## 2010-08-12 DIAGNOSIS — E119 Type 2 diabetes mellitus without complications: Secondary | ICD-10-CM

## 2010-08-12 DIAGNOSIS — D649 Anemia, unspecified: Secondary | ICD-10-CM

## 2010-08-12 DIAGNOSIS — I10 Essential (primary) hypertension: Secondary | ICD-10-CM

## 2010-08-12 DIAGNOSIS — G609 Hereditary and idiopathic neuropathy, unspecified: Secondary | ICD-10-CM

## 2010-08-12 DIAGNOSIS — E785 Hyperlipidemia, unspecified: Secondary | ICD-10-CM

## 2010-08-12 DIAGNOSIS — G4730 Sleep apnea, unspecified: Secondary | ICD-10-CM

## 2010-08-13 ENCOUNTER — Encounter (HOSPITAL_COMMUNITY): Payer: BC Managed Care – PPO

## 2010-08-16 ENCOUNTER — Encounter: Payer: Self-pay | Admitting: Family Medicine

## 2010-08-17 ENCOUNTER — Encounter: Payer: Self-pay | Admitting: Pulmonary Disease

## 2010-08-17 LAB — POCT URINALYSIS DIPSTICK
Ketones, ur: NEGATIVE mg/dL
Protein, ur: 100 mg/dL — AB
Specific Gravity, Urine: >=1.03 (ref 1.005–1.030)
pH: 5.5 (ref 5.0–8.0)

## 2010-08-18 ENCOUNTER — Encounter (HOSPITAL_COMMUNITY): Payer: BC Managed Care – PPO

## 2010-08-18 NOTE — Miscellaneous (Signed)
Summary: Lab Value Form/[facility]  Lab Value Form/[facility]   Imported By: Lanelle Bal 08/12/2010 08:18:20  _____________________________________________________________________  External Attachment:    Type:   Image     Comment:   External Document

## 2010-08-20 ENCOUNTER — Other Ambulatory Visit: Payer: BC Managed Care – PPO

## 2010-08-20 ENCOUNTER — Encounter (HOSPITAL_COMMUNITY): Payer: BC Managed Care – PPO

## 2010-08-20 LAB — LIPID PANEL
LDL Cholesterol: 69 mg/dL (ref 0–99)
Total CHOL/HDL Ratio: 3

## 2010-08-20 LAB — CBC WITH DIFFERENTIAL/PLATELET
Basophils Absolute: 0 10*3/uL (ref 0.0–0.1)
Basophils Relative: 0.4 % (ref 0.0–3.0)
Eosinophils Absolute: 0.1 10*3/uL (ref 0.0–0.7)
Hemoglobin: 13.7 g/dL (ref 13.0–17.0)
Lymphocytes Relative: 39.1 % (ref 12.0–46.0)
MCHC: 33.7 g/dL (ref 30.0–36.0)
MCV: 79.6 fl (ref 78.0–100.0)
Monocytes Absolute: 0.7 10*3/uL (ref 0.1–1.0)
Neutro Abs: 2.3 10*3/uL (ref 1.4–7.7)
Neutrophils Relative %: 46 % (ref 43.0–77.0)
RBC: 5.1 Mil/uL (ref 4.22–5.81)
RDW: 15.6 % — ABNORMAL HIGH (ref 11.5–14.6)

## 2010-08-20 LAB — HEPATIC FUNCTION PANEL
Albumin: 4.2 g/dL (ref 3.5–5.2)
Alkaline Phosphatase: 51 U/L (ref 39–117)
Total Protein: 7.6 g/dL (ref 6.0–8.3)

## 2010-08-20 LAB — BASIC METABOLIC PANEL
CO2: 25 mEq/L (ref 19–32)
Calcium: 9.6 mg/dL (ref 8.4–10.5)
Chloride: 108 mEq/L (ref 96–112)
Creatinine, Ser: 1.1 mg/dL (ref 0.4–1.5)
Glucose, Bld: 151 mg/dL — ABNORMAL HIGH (ref 70–99)

## 2010-08-21 ENCOUNTER — Telehealth: Payer: Self-pay | Admitting: Family Medicine

## 2010-08-24 ENCOUNTER — Emergency Department (HOSPITAL_COMMUNITY): Payer: BC Managed Care – PPO

## 2010-08-24 ENCOUNTER — Emergency Department (HOSPITAL_COMMUNITY)
Admission: EM | Admit: 2010-08-24 | Discharge: 2010-08-25 | Disposition: A | Discharge status: Home / Self Care | Payer: BC Managed Care – PPO | Attending: Emergency Medicine | Admitting: Emergency Medicine

## 2010-08-24 DIAGNOSIS — I10 Essential (primary) hypertension: Secondary | ICD-10-CM | POA: Insufficient documentation

## 2010-08-24 DIAGNOSIS — E119 Type 2 diabetes mellitus without complications: Secondary | ICD-10-CM | POA: Insufficient documentation

## 2010-08-24 DIAGNOSIS — Z7982 Long term (current) use of aspirin: Secondary | ICD-10-CM | POA: Insufficient documentation

## 2010-08-24 DIAGNOSIS — N201 Calculus of ureter: Secondary | ICD-10-CM | POA: Insufficient documentation

## 2010-08-24 DIAGNOSIS — E7800 Pure hypercholesterolemia, unspecified: Secondary | ICD-10-CM | POA: Insufficient documentation

## 2010-08-24 DIAGNOSIS — R109 Unspecified abdominal pain: Secondary | ICD-10-CM | POA: Insufficient documentation

## 2010-08-24 DIAGNOSIS — I509 Heart failure, unspecified: Secondary | ICD-10-CM | POA: Insufficient documentation

## 2010-08-24 DIAGNOSIS — Z79899 Other long term (current) drug therapy: Secondary | ICD-10-CM | POA: Insufficient documentation

## 2010-08-24 LAB — CBC
Hemoglobin: 10.8 g/dL — ABNORMAL LOW (ref 13.0–17.0)
Hemoglobin: 11.9 g/dL — ABNORMAL LOW (ref 13.0–17.0)
Hemoglobin: 12.5 g/dL — ABNORMAL LOW (ref 13.0–17.0)
MCHC: 33.2 g/dL (ref 30.0–36.0)
MCHC: 32.7 g/dL (ref 30.0–36.0)
MCV: 79.4 fL (ref 78.0–100.0)
Platelets: 508 10*3/uL — ABNORMAL HIGH (ref 150–400)
Platelets: 208 10*3/uL (ref 150–400)
RBC: 4.02 MIL/uL — ABNORMAL LOW (ref 4.22–5.81)
RBC: 4.55 MIL/uL (ref 4.22–5.81)
RDW: 15.4 % (ref 11.5–15.5)
RDW: 15.8 % — ABNORMAL HIGH (ref 11.5–15.5)
RDW: 15.1 % (ref 11.5–15.5)

## 2010-08-24 LAB — BASIC METABOLIC PANEL
CO2: 25 mEq/L (ref 19–32)
Calcium: 8.7 mg/dL (ref 8.4–10.5)
Calcium: 9.7 mg/dL (ref 8.4–10.5)
Chloride: 102 mEq/L (ref 96–112)
Chloride: 103 mEq/L (ref 96–112)
GFR calc Af Amer: >60 mL/min (ref >60)
GFR calc Af Amer: >60 mL/min (ref >60)
GFR calc Af Amer: >60 mL/min (ref >60)
GFR calc non Af Amer: 57 mL/min — ABNORMAL LOW (ref >60)
GFR calc non Af Amer: >60 mL/min (ref >60)
GFR calc non Af Amer: >60 mL/min (ref >60)
Glucose, Bld: 182 mg/dL — ABNORMAL HIGH (ref 70–99)
Potassium: 4.2 mEq/L (ref 3.5–5.1)
Potassium: 4.2 mEq/L (ref 3.5–5.1)
Sodium: 136 mEq/L (ref 135–145)
Sodium: 138 mEq/L (ref 135–145)
Sodium: 138 mEq/L (ref 135–145)

## 2010-08-24 LAB — GLUCOSE, CAPILLARY
Glucose-Capillary: 109 mg/dL — ABNORMAL HIGH (ref 70–99)
Glucose-Capillary: 124 mg/dL — ABNORMAL HIGH (ref 70–99)
Glucose-Capillary: 125 mg/dL — ABNORMAL HIGH (ref 70–99)
Glucose-Capillary: 157 mg/dL — ABNORMAL HIGH (ref 70–99)
Glucose-Capillary: 168 mg/dL — ABNORMAL HIGH (ref 70–99)
Glucose-Capillary: 188 mg/dL — ABNORMAL HIGH (ref 70–99)
Glucose-Capillary: 238 mg/dL — ABNORMAL HIGH (ref 70–99)

## 2010-08-24 LAB — PROTIME-INR
INR: 2.57 — ABNORMAL HIGH (ref 0.00–1.49)
INR: 4.44 — ABNORMAL HIGH (ref 0.00–1.49)
Prothrombin Time: 24.1 seconds — ABNORMAL HIGH (ref 11.6–15.2)
Prothrombin Time: 27.4 seconds — ABNORMAL HIGH (ref 11.6–15.2)
Prothrombin Time: 37.3 seconds — ABNORMAL HIGH (ref 11.6–15.2)
Prothrombin Time: 42 seconds — ABNORMAL HIGH (ref 11.6–15.2)

## 2010-08-24 LAB — DIFFERENTIAL
Basophils Absolute: 0 10*3/uL (ref 0.0–0.1)
Basophils Absolute: 0 10*3/uL (ref 0.0–0.1)
Basophils Relative: 0 % (ref 0–1)
Basophils Relative: 0 % (ref 0–1)
Eosinophils Absolute: 0.1 10*3/uL (ref 0.0–0.7)
Eosinophils Absolute: 0 10*3/uL (ref 0.0–0.7)
Eosinophils Relative: 0 % (ref 0–5)
Lymphocytes Relative: 26 % (ref 12–46)
Monocytes Absolute: 0.6 10*3/uL (ref 0.1–1.0)
Monocytes Absolute: 0.6 10*3/uL (ref 0.1–1.0)
Monocytes Relative: 12 % (ref 3–12)
Monocytes Relative: 5 % (ref 3–12)
Neutro Abs: 2.9 10*3/uL (ref 1.7–7.7)
Neutro Abs: 5.8 10*3/uL (ref 1.7–7.7)
Neutrophils Relative %: 82 % — ABNORMAL HIGH (ref 43–77)

## 2010-08-24 LAB — APTT
aPTT: 120 seconds — ABNORMAL HIGH (ref 24–37)
aPTT: 43 seconds — ABNORMAL HIGH (ref 24–37)
aPTT: 46 seconds — ABNORMAL HIGH (ref 24–37)

## 2010-08-24 LAB — URINALYSIS, ROUTINE W REFLEX MICROSCOPIC
Glucose, UA: 100 mg/dL — AB
Leukocytes, UA: NEGATIVE
Specific Gravity, Urine: 1.029 (ref 1.005–1.030)
Urobilinogen, UA: 0.2 mg/dL (ref 0.0–1.0)

## 2010-08-24 LAB — CLOSTRIDIUM DIFFICILE EIA

## 2010-08-24 LAB — DIGOXIN LEVEL: Digoxin Level: 0.5 ng/mL — ABNORMAL LOW (ref 0.8–2.0)

## 2010-08-24 LAB — URINE MICROSCOPIC-ADD ON

## 2010-08-24 LAB — HEPARIN LEVEL (UNFRACTIONATED): Heparin Unfractionated: 0.59 IU/mL (ref 0.30–0.70)

## 2010-08-24 LAB — POCT I-STAT, CHEM 8
Calcium, Ion: 1.27 mmol/L (ref 1.12–1.32)
Creatinine, Ser: 1.3 mg/dL (ref 0.4–1.5)
Glucose, Bld: 100 mg/dL — ABNORMAL HIGH (ref 70–99)
Hemoglobin: 13.9 g/dL (ref 13.0–17.0)
Potassium: 4.7 mEq/L (ref 3.5–5.1)

## 2010-08-25 ENCOUNTER — Encounter (HOSPITAL_COMMUNITY): Payer: BC Managed Care – PPO

## 2010-08-25 LAB — LIPID PANEL
Cholesterol: 75 mg/dL (ref 0–200)
HDL: 26 mg/dL — ABNORMAL LOW (ref >39)
HDL: 41 mg/dL (ref >39)
LDL Cholesterol: 45 mg/dL (ref 0–99)
Total CHOL/HDL Ratio: 2.4 RATIO
Total CHOL/HDL Ratio: 2.9 RATIO
Triglycerides: 57 mg/dL (ref <150)
VLDL: 11 mg/dL (ref 0–40)

## 2010-08-25 LAB — BASIC METABOLIC PANEL
BUN: 17 mg/dL (ref 6–23)
BUN: 23 mg/dL (ref 6–23)
CO2: 24 mEq/L (ref 19–32)
CO2: 25 mEq/L (ref 19–32)
Calcium: 8.2 mg/dL — ABNORMAL LOW (ref 8.4–10.5)
Calcium: 8.7 mg/dL (ref 8.4–10.5)
Calcium: 8.7 mg/dL (ref 8.4–10.5)
Chloride: 104 mEq/L (ref 96–112)
Chloride: 104 mEq/L (ref 96–112)
Creatinine, Ser: 1.27 mg/dL (ref 0.4–1.5)
GFR calc Af Amer: >60 mL/min (ref >60)
GFR calc Af Amer: >60 mL/min (ref >60)
GFR calc non Af Amer: >60 mL/min (ref >60)
Glucose, Bld: 143 mg/dL — ABNORMAL HIGH (ref 70–99)
Potassium: 3.9 mEq/L (ref 3.5–5.1)
Sodium: 136 mEq/L (ref 135–145)
Sodium: 136 mEq/L (ref 135–145)

## 2010-08-25 LAB — HEMOGLOBIN A1C
Hgb A1c MFr Bld: 6.6 % — ABNORMAL HIGH (ref <5.7)
Mean Plasma Glucose: 143 mg/dL — ABNORMAL HIGH (ref <117)

## 2010-08-25 LAB — COMPREHENSIVE METABOLIC PANEL
ALT: 19 U/L (ref 0–53)
ALT: 45 U/L (ref 0–53)
AST: 25 U/L (ref 0–37)
AST: 57 U/L — ABNORMAL HIGH (ref 0–37)
Albumin: 2.5 g/dL — ABNORMAL LOW (ref 3.5–5.2)
Albumin: 2.8 g/dL — ABNORMAL LOW (ref 3.5–5.2)
Albumin: 3.9 g/dL (ref 3.5–5.2)
Alkaline Phosphatase: 42 U/L (ref 39–117)
BUN: 26 mg/dL — ABNORMAL HIGH (ref 6–23)
BUN: 33 mg/dL — ABNORMAL HIGH (ref 6–23)
CO2: 23 mEq/L (ref 19–32)
Calcium: 8.2 mg/dL — ABNORMAL LOW (ref 8.4–10.5)
Calcium: 8.2 mg/dL — ABNORMAL LOW (ref 8.4–10.5)
Calcium: 9.9 mg/dL (ref 8.4–10.5)
Creatinine, Ser: 1.26 mg/dL (ref 0.4–1.5)
Creatinine, Ser: 1.39 mg/dL (ref 0.4–1.5)
Creatinine, Ser: 1.52 mg/dL — ABNORMAL HIGH (ref 0.4–1.5)
GFR calc Af Amer: 54 mL/min — ABNORMAL LOW (ref >60)
GFR calc Af Amer: 58 mL/min — ABNORMAL LOW (ref >60)
GFR calc Af Amer: >60 mL/min (ref >60)
GFR calc non Af Amer: 48 mL/min — ABNORMAL LOW (ref >60)
Glucose, Bld: 123 mg/dL — ABNORMAL HIGH (ref 70–99)
Potassium: 3.3 mEq/L — ABNORMAL LOW (ref 3.5–5.1)
Potassium: 3.9 mEq/L (ref 3.5–5.1)
Sodium: 133 mEq/L — ABNORMAL LOW (ref 135–145)
Sodium: 136 mEq/L (ref 135–145)
Sodium: 139 mEq/L (ref 135–145)
Total Protein: 6.1 g/dL (ref 6.0–8.3)
Total Protein: 6.9 g/dL (ref 6.0–8.3)
Total Protein: 6.9 g/dL (ref 6.0–8.3)
Total Protein: 7.4 g/dL (ref 6.0–8.3)

## 2010-08-25 LAB — URINALYSIS, ROUTINE W REFLEX MICROSCOPIC
Ketones, ur: NEGATIVE mg/dL
Nitrite: NEGATIVE
Urobilinogen, UA: 1 mg/dL (ref 0.0–1.0)

## 2010-08-25 LAB — URINE CULTURE
Colony Count: NO GROWTH
Colony Count: NO GROWTH
Culture  Setup Time: 201203192338
Culture: NO GROWTH
Culture: NO GROWTH

## 2010-08-25 LAB — PROTIME-INR
INR: 1.54 — ABNORMAL HIGH (ref 0.00–1.49)
INR: 1.57 — ABNORMAL HIGH (ref 0.00–1.49)
Prothrombin Time: 18.4 seconds — ABNORMAL HIGH (ref 11.6–15.2)
Prothrombin Time: 18.9 seconds — ABNORMAL HIGH (ref 11.6–15.2)
Prothrombin Time: 19.9 seconds — ABNORMAL HIGH (ref 11.6–15.2)

## 2010-08-25 LAB — CARDIAC PANEL(CRET KIN+CKTOT+MB+TROPI)
CK, MB: 1 ng/mL (ref 0.3–4.0)
CK, MB: 1.1 ng/mL (ref 0.3–4.0)
CK, MB: 1.4 ng/mL (ref 0.3–4.0)
CK, MB: 3 ng/mL (ref 0.3–4.0)
Relative Index: 2.1 (ref 0.0–2.5)
Relative Index: INVALID (ref 0.0–2.5)
Relative Index: INVALID (ref 0.0–2.5)
Total CK: 142 U/L (ref 7–232)
Total CK: 88 U/L (ref 7–232)
Total CK: 89 U/L (ref 7–232)
Total CK: 89 U/L (ref 7–232)
Troponin I: 0.1 ng/mL — ABNORMAL HIGH (ref 0.00–0.06)
Troponin I: 0.13 ng/mL — ABNORMAL HIGH (ref 0.00–0.06)
Troponin I: 0.13 ng/mL — ABNORMAL HIGH (ref 0.00–0.06)
Troponin I: 0.27 ng/mL — ABNORMAL HIGH (ref 0.00–0.06)

## 2010-08-25 LAB — GLUCOSE, CAPILLARY
Glucose-Capillary: 104 mg/dL — ABNORMAL HIGH (ref 70–99)
Glucose-Capillary: 115 mg/dL — ABNORMAL HIGH (ref 70–99)
Glucose-Capillary: 115 mg/dL — ABNORMAL HIGH (ref 70–99)
Glucose-Capillary: 122 mg/dL — ABNORMAL HIGH (ref 70–99)
Glucose-Capillary: 125 mg/dL — ABNORMAL HIGH (ref 70–99)
Glucose-Capillary: 128 mg/dL — ABNORMAL HIGH (ref 70–99)
Glucose-Capillary: 132 mg/dL — ABNORMAL HIGH (ref 70–99)
Glucose-Capillary: 133 mg/dL — ABNORMAL HIGH (ref 70–99)
Glucose-Capillary: 139 mg/dL — ABNORMAL HIGH (ref 70–99)
Glucose-Capillary: 210 mg/dL — ABNORMAL HIGH (ref 70–99)
Glucose-Capillary: 269 mg/dL — ABNORMAL HIGH (ref 70–99)
Glucose-Capillary: 88 mg/dL (ref 70–99)
Glucose-Capillary: 98 mg/dL (ref 70–99)

## 2010-08-25 LAB — CBC
HCT: 30.7 % — ABNORMAL LOW (ref 39.0–52.0)
HCT: 32 % — ABNORMAL LOW (ref 39.0–52.0)
HCT: 32.3 % — ABNORMAL LOW (ref 39.0–52.0)
HCT: 37.2 % — ABNORMAL LOW (ref 39.0–52.0)
Hemoglobin: 10.5 g/dL — ABNORMAL LOW (ref 13.0–17.0)
Hemoglobin: 11 g/dL — ABNORMAL LOW (ref 13.0–17.0)
Hemoglobin: 11.1 g/dL — ABNORMAL LOW (ref 13.0–17.0)
Hemoglobin: 12.7 g/dL — ABNORMAL LOW (ref 13.0–17.0)
MCHC: 33.6 g/dL (ref 30.0–36.0)
MCHC: 33.8 g/dL (ref 30.0–36.0)
MCHC: 34.1 g/dL (ref 30.0–36.0)
MCHC: 34.1 g/dL (ref 30.0–36.0)
MCHC: 34.3 g/dL (ref 30.0–36.0)
MCV: 80.1 fL (ref 78.0–100.0)
MCV: 80.3 fL (ref 78.0–100.0)
MCV: 81 fL (ref 78.0–100.0)
Platelets: 166 10*3/uL (ref 150–400)
Platelets: 181 10*3/uL (ref 150–400)
Platelets: 224 10*3/uL (ref 150–400)
Platelets: 299 10*3/uL (ref 150–400)
RBC: 4.03 MIL/uL — ABNORMAL LOW (ref 4.22–5.81)
RBC: 4.1 MIL/uL — ABNORMAL LOW (ref 4.22–5.81)
RBC: 4.48 MIL/uL (ref 4.22–5.81)
RBC: 4.63 MIL/uL (ref 4.22–5.81)
RDW: 15.3 % (ref 11.5–15.5)
RDW: 15.4 % (ref 11.5–15.5)
RDW: 15.6 % — ABNORMAL HIGH (ref 11.5–15.5)
RDW: 15.9 % — ABNORMAL HIGH (ref 11.5–15.5)
WBC: 10.8 10*3/uL — ABNORMAL HIGH (ref 4.0–10.5)
WBC: 11.1 10*3/uL — ABNORMAL HIGH (ref 4.0–10.5)
WBC: 11.6 10*3/uL — ABNORMAL HIGH (ref 4.0–10.5)

## 2010-08-25 LAB — DIFFERENTIAL
Basophils Absolute: 0 10*3/uL (ref 0.0–0.1)
Basophils Relative: 0 % (ref 0–1)
Eosinophils Relative: 0 % (ref 0–5)
Eosinophils Relative: 0 % (ref 0–5)
Lymphocytes Relative: 11 % — ABNORMAL LOW (ref 12–46)
Lymphs Abs: 1.9 10*3/uL (ref 0.7–4.0)
Monocytes Absolute: 1.6 10*3/uL — ABNORMAL HIGH (ref 0.1–1.0)
Monocytes Relative: 12 % (ref 3–12)
Monocytes Relative: 12 % (ref 3–12)

## 2010-08-25 LAB — CULTURE, BLOOD (ROUTINE X 2)

## 2010-08-25 LAB — MAGNESIUM
Magnesium: 2.2 mg/dL (ref 1.5–2.5)
Magnesium: 2.3 mg/dL (ref 1.5–2.5)

## 2010-08-25 LAB — HEPATIC FUNCTION PANEL
AST: 143 U/L — ABNORMAL HIGH (ref 0–37)
Albumin: 2.6 g/dL — ABNORMAL LOW (ref 3.5–5.2)
Albumin: 2.8 g/dL — ABNORMAL LOW (ref 3.5–5.2)
Alkaline Phosphatase: 61 U/L (ref 39–117)
Alkaline Phosphatase: 64 U/L (ref 39–117)
Indirect Bilirubin: 0.8 mg/dL (ref 0.3–0.9)
Total Bilirubin: 1 mg/dL (ref 0.3–1.2)
Total Bilirubin: 1.2 mg/dL (ref 0.3–1.2)
Total Protein: 6.5 g/dL (ref 6.0–8.3)

## 2010-08-25 LAB — POCT I-STAT 3, ART BLOOD GAS (G3+)
Acid-base deficit: 1 mmol/L (ref 0.0–2.0)
Acid-base deficit: 1 mmol/L (ref 0.0–2.0)
O2 Saturation: 92 %
Patient temperature: 98.6
TCO2: 24 mmol/L (ref 0–100)
pCO2 arterial: 34.1 mmHg — ABNORMAL LOW (ref 35.0–45.0)
pH, Arterial: 7.42 (ref 7.350–7.450)
pO2, Arterial: 62 mmHg — ABNORMAL LOW (ref 80.0–100.0)

## 2010-08-25 LAB — HEPARIN LEVEL (UNFRACTIONATED)
Heparin Unfractionated: 0.24 IU/mL — ABNORMAL LOW (ref 0.30–0.70)
Heparin Unfractionated: 0.51 IU/mL (ref 0.30–0.70)
Heparin Unfractionated: 0.53 IU/mL (ref 0.30–0.70)
Heparin Unfractionated: 0.72 IU/mL — ABNORMAL HIGH (ref 0.30–0.70)
Heparin Unfractionated: 0.86 IU/mL — ABNORMAL HIGH (ref 0.30–0.70)

## 2010-08-25 LAB — POCT CARDIAC MARKERS
CKMB, poc: <1 ng/mL — ABNORMAL LOW (ref 1.0–8.0)
Myoglobin, poc: 88 ng/mL (ref 12–200)
Troponin i, poc: <0.05 ng/mL (ref 0.00–0.09)

## 2010-08-25 LAB — POCT I-STAT, CHEM 8
Chloride: 102 mEq/L (ref 96–112)
Glucose, Bld: 108 mg/dL — ABNORMAL HIGH (ref 70–99)
HCT: 41 % (ref 39.0–52.0)
Potassium: 3.9 mEq/L (ref 3.5–5.1)
Sodium: 138 mEq/L (ref 135–145)

## 2010-08-25 LAB — BRAIN NATRIURETIC PEPTIDE
Pro B Natriuretic peptide (BNP): 377 pg/mL — ABNORMAL HIGH (ref 0.0–100.0)
Pro B Natriuretic peptide (BNP): 414 pg/mL — ABNORMAL HIGH (ref 0.0–100.0)
Pro B Natriuretic peptide (BNP): 629 pg/mL — ABNORMAL HIGH (ref 0.0–100.0)
Pro B Natriuretic peptide (BNP): 820 pg/mL — ABNORMAL HIGH (ref 0.0–100.0)

## 2010-08-25 LAB — STREP PNEUMONIAE URINARY ANTIGEN: Strep Pneumo Urinary Antigen: NEGATIVE

## 2010-08-25 LAB — PHOSPHORUS
Phosphorus: 1.6 mg/dL — ABNORMAL LOW (ref 2.3–4.6)
Phosphorus: 2.4 mg/dL (ref 2.3–4.6)
Phosphorus: 2.7 mg/dL (ref 2.3–4.6)

## 2010-08-25 LAB — D-DIMER, QUANTITATIVE: D-Dimer, Quant: 2.12 ug/mL-FEU — ABNORMAL HIGH (ref 0.00–0.48)

## 2010-08-25 LAB — CK TOTAL AND CKMB (NOT AT ARMC): Relative Index: 0.9 (ref 0.0–2.5)

## 2010-08-25 LAB — APTT: aPTT: 192 seconds — ABNORMAL HIGH (ref 24–37)

## 2010-08-25 LAB — MRSA PCR SCREENING: MRSA by PCR: NEGATIVE

## 2010-08-25 LAB — TSH: TSH: 1.251 u[IU]/mL (ref 0.350–4.500)

## 2010-08-25 NOTE — Assessment & Plan Note (Signed)
Summary: NEUROPATHY   Vital Signs:  Patient profile:   60 year old male Height:      70 inches (177.80 cm) Weight:      230.50 pounds (104.77 kg) O2 Sat:      99 % on Room air Temp:     99.2 degrees F (37.33 degrees C) oral Pulse rate:   90 / minute BP sitting:   151 / 98  (right arm) Cuff size:   large  Vitals Entered By: Josph Macho RMA (August 12, 2010 1:17 PM)  O2 Flow:  Room air CC: Possible neuropathy- CF Is Patient Diabetic? Yes   History of Present Illness: Patient is a 60 yo AA male who is in today complaining of pain in his legs. The pain is b/l and he hs struggled with it in the past, he describes it as burning and present day and night although it does bother him more at night. He took Lyrica in the past and had a good response but is now struggling again. Otherwise he reports feeling well recently. Denies any recent illness/fevers/chills/CP/SOB/palp/GI or GU c/o.  Current Medications (verified): 1)  Lisinopril 20 Mg Tabs (Lisinopril) .... Take 1 Tablet By Mouth Two Times A Day 2)  Crestor 20 Mg Tabs (Rosuvastatin Calcium) .[name].. 1 Once Daily 3)  Combivent 18-103 Mcg/act Aero (Ipratropium-Albuterol) .... 2 Puffs Every 4 Hours As Needed 4)  Furosemide 40 Mg Tabs (Furosemide) .... 1/2 Once Daily and 1 Extra If Wt Is Over 196 5)  Metformin Hcl 1000 Mg Tabs (Metformin Hcl) .[name].. 1 Tablet Two Times A Day 6)  Prilosec 40 Mg Cpdr (Omeprazole) .[name].. 1 Once Daily 7)  Aspir-Low 81 Mg Tbec (Aspirin) .... Take 1 Tablet By Mouth Once A Day 8)  Potassium Chloride Crys Cr 20 Meq Cr-Tabs (Potassium Chloride Crys Cr) .... 1/2 Tab By Mouth 9)  Diltiazem Hcl Er Beads 240 Mg Xr24h-Cap (Diltiazem Hcl Er Beads) .[name].. 1 Tablet By Mouth 10)  Diabeta 5 Mg Tabs (Glyburide) .[name].. 1 Tab By Mouth Qd 11)  Meloxicam 7.5 Mg Tabs (Meloxicam) .[name].. 1 Tab By Mouth Two Times A Day As Needed Pain With Food 12)  Colcrys 0.6 Mg Tabs (Colchicine) .[name].. 1 Tab By Mouth Q 2 Hours As Needed Pain To A Max of 6  Tabs/24hr, Pain Relief or Diarrhea 13)  Metoprolol Succinate 50 Mg Xr24h-Tab (Metoprolol Succinate) .... Take 1 Tablet By Mouth Two Times A Day 14)  Multivitamins   Tabs (Multiple Vitamin) .... Take 1 Tablet By Mouth Once A Day 15)  Fish Oil  Oil (Fish Oil) .... Take 1 Capsule By Mouth Once A Day  Allergies (verified): 1)  ! Sulfa  Past History:  Past medical history reviewed for relevance to current acute and chronic problems. Social history (including risk factors) reviewed for relevance to current acute and chronic problems.  Past Medical History: Reviewed history from 03/30/2010 and no changes required. Hypertension EMG/NCS 01/17/2006 .Marland KitchenMarland KitchenDr Anne Hahn > Suuggestive of diabetic neuropathy #Cough April 2007 REolsved  - CT chest 09/20/2005 - No hilar or lung mass (prominent hilum on CXR was due to pulmonary vessels)  -> was given Singulair by Dr [name] in 2004. No record of PFTs  Social History: Reviewed history from 05/08/2010 and no changes required. 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  Quit in 1982.  Smoked approx 10 yrs up to 1/21 ppd. No ETOH  Review of Systems      See HPI  Physical  Exam  General:  Well-developed,well-nourished,in no acute distress; alert,appropriate and cooperative throughout examination Head:  Normocephalic and atraumatic without obvious abnormalities. No apparent alopecia or balding. Mouth:  Oral mucosa and oropharynx without lesions or exudates.   Neck:  No deformities, masses, or tenderness noted. Lungs:  Normal respiratory effort, chest expands symmetrically. Lungs are clear to auscultation, no crackles or wheezes. Heart:  Normal rate and regular rhythm. S1 and S2 normal without gallop, murmur, click, rub or other extra sounds. Abdomen:  Bowel sounds positive,abdomen soft and non-tender without masses, organomegaly or hernias noted. Extremities:  No clubbing, cyanosis, edema, or deformity noted with normal full  range of motion of all joints.   Psych:  Cognition and judgment appear intact. Alert and cooperative with normal attention span and concentration. No apparent delusions, illusions, hallucinations   Impression & Recommendations:  Problem # 1:  PERIPHERAL NEUROPATHY (ICD-356.9) Will start Neurontin and titrate up as tolerated.  Problem # 2:  UNSPECIFIED ANEMIA (ICD-285.9) Will repeat CBC prior to next visit.  Problem # 3:  DM (ICD-250.00)  His updated medication list for this problem includes:    Lisinopril 20 Mg Tabs (Lisinopril) .[name]... Take 1 tablet by mouth two times a day    Metformin Hcl 1000 Mg Tabs (Metformin hcl) .[name]... 1 tablet two times a day    Aspir-low 81 Mg Tbec (Aspirin) .[name]... Take 1 tablet by mouth once a day    Diabeta 5 Mg Tabs (Glyburide) .[name]... 1 tab by mouth qd  Orders: TLB-A1C / Hgb A1C (Glycohemoglobin) (83036-A1C) Patient reports recent adequate control  Problem # 4:  HYPERTENSION (ICD-401.9)  The following medications were removed from the medication list:    Hydralazine Hcl 25 Mg Tabs (Hydralazine hcl) .[name]... Take 1 tablet by mouth three times a day His updated medication list for this problem includes:    Lisinopril 20 Mg Tabs (Lisinopril) .[name]... Take 1 tablet by mouth two times a day    Furosemide 40 Mg Tabs (Furosemide) .[name]... 1/2 once daily and 1 extra if wt is over 196    Diltiazem Hcl Er Beads 240 Mg Xr24h-cap (Diltiazem hcl er beads) .[name]... 1 tablet by mouth    Metoprolol Succinate 50 Mg Xr24h-tab (Metoprolol succinate) .[name]... Take 1 tablet by mouth in pm and 2 tabs by mouth q am  Orders: TLB-BMP (Basic Metabolic Panel-BMET) (80048-METABOL) TLB-CBC Platelet - w/Differential (85025-CBCD) TLB-Hepatic/Liver Function Pnl (80076-HEPATIC) TLB-TSH (Thyroid Stimulating Hormone) (84443-TSH) Still not adquately controlled, will increase  Metoprolol  Complete Medication List: 1)  Lisinopril 20 Mg Tabs (Lisinopril) .... Take 1 tablet by mouth two times a day 2)   Crestor 20 Mg Tabs (Rosuvastatin calcium) .[name].. 1 once daily 3)  Combivent 18-103 Mcg/act Aero (Ipratropium-albuterol) .... 2 puffs every 4 hours as needed 4)  Furosemide 40 Mg Tabs (Furosemide) .... 1/2 once daily and 1 extra if wt is over 196 5)  Metformin Hcl 1000 Mg Tabs (Metformin hcl) .[name].. 1 tablet two times a day 6)  Prilosec 40 Mg Cpdr (Omeprazole) .[name].. 1 once daily 7)  Aspir-low 81 Mg Tbec (Aspirin) .... Take 1 tablet by mouth once a day 8)  Potassium Chloride Crys Cr 20 Meq Cr-tabs (Potassium chloride crys cr) .... 1/2 tab by mouth 9)  Diltiazem Hcl Er Beads 240 Mg Xr24h-cap (Diltiazem hcl er beads) .[name].. 1 tablet by mouth 10)  Diabeta 5 Mg Tabs (Glyburide) .[name].. 1 tab by mouth qd 11)  Meloxicam 7.5 Mg Tabs (Meloxicam) .[name].. 1 tab by mouth two times  a day as needed pain with food 12)  Colcrys 0.6 Mg Tabs (Colchicine) .[name].. 1 tab by mouth q 2 hours as needed pain to a max of 6 tabs/24hr, pain relief or diarrhea 13)  Metoprolol Succinate 50 Mg Xr24h-tab (Metoprolol succinate) .... Take 1 tablet by mouth in pm and 2 tabs by mouth q am 14)  Multivitamins Tabs (Multiple vitamin) .... Take 1 tablet by mouth once a day 15)  Fish Oil Oil (Fish oil) .... Take 1 capsule by mouth once a day 16)  Neurontin 100 Mg Caps (Gabapentin) .[name].. 1 cap by mouth at bedtime and titrate up to 3 tabs at bedtime as tolerated daily  Other Orders: TLB-Lipid Panel (80061-LIPID)  Patient Instructions: 1)  Please schedule a follow-up appointment in 1 to 2 month or as needed 2)  call if any concerns 3)  Call with any sugar concerns 4)  Check your blood sugars regularly. If your readings are usually above:  350 or below 70 you should contact our office.  Prescriptions: NEURONTIN 100 MG CAPS (GABAPENTIN) 1 cap by mouth at bedtime and titrate up to 3 tabs at bedtime as tolerated daily  #90 x 1   Entered and Authorized by:   Danise Edge MD   Signed by:   Danise Edge MD on 08/12/2010   Method used:   Electronically to          [facility] [phone]* (retail)       [address]       [place], [place]  [address]       Ph: [phone] or [phone]       Fax: [phone]   RxID:   [id number] METOPROLOL SUCCINATE 50 MG XR24H-TAB (METOPROLOL SUCCINATE) Take 1 tablet by mouth in pm and 2 tabs by mouth q am  #90 x 2   Entered and Authorized by:   Danise Edge MD   Signed by:   Danise Edge MD on 08/12/2010   Method used:   Electronically to        [facility] [phone]* (retail)       [address]       [place], [place]  [address]       Ph: [phone] or [phone]       Fax: [phone]   RxID:   [id number]    Orders Added: 1)  TLB-BMP (Basic Metabolic Panel-BMET) [80048-METABOL] 2)  TLB-CBC Platelet - w/Differential [85025-CBCD] 3)  TLB-Hepatic/Liver Function Pnl [80076-HEPATIC] 4)  TLB-TSH (Thyroid Stimulating Hormone) [84443-TSH] 5)  TLB-Lipid Panel [80061-LIPID] 6)  TLB-A1C / Hgb A1C (Glycohemoglobin) [83036-A1C] 7)  Est. Patient Level IV [42706]

## 2010-08-25 NOTE — Letter (Signed)
Summary: Thurmon Fair MD/[facility]  Mihai Croitoru MD/[facility]   Imported By: Lester Thayer 08/17/2010 10:33:12  _____________________________________________________________________  External Attachment:    Type:   Image     Comment:   External Document

## 2010-08-25 NOTE — Telephone Encounter (Signed)
Mailed copy of labs to pt

## 2010-08-27 ENCOUNTER — Encounter (HOSPITAL_COMMUNITY): Payer: BC Managed Care – PPO

## 2010-09-01 ENCOUNTER — Encounter (HOSPITAL_COMMUNITY): Payer: BC Managed Care – PPO

## 2010-09-03 ENCOUNTER — Encounter (HOSPITAL_COMMUNITY): Payer: BC Managed Care – PPO

## 2010-09-08 ENCOUNTER — Encounter (HOSPITAL_COMMUNITY): Payer: BC Managed Care – PPO

## 2010-09-08 ENCOUNTER — Encounter (HOSPITAL_COMMUNITY): Payer: BC Managed Care – PPO | Attending: Internal Medicine

## 2010-09-08 DIAGNOSIS — Z5189 Encounter for other specified aftercare: Secondary | ICD-10-CM | POA: Insufficient documentation

## 2010-09-08 DIAGNOSIS — I5032 Chronic diastolic (congestive) heart failure: Secondary | ICD-10-CM | POA: Insufficient documentation

## 2010-09-08 DIAGNOSIS — I359 Nonrheumatic aortic valve disorder, unspecified: Secondary | ICD-10-CM | POA: Insufficient documentation

## 2010-09-08 DIAGNOSIS — G4733 Obstructive sleep apnea (adult) (pediatric): Secondary | ICD-10-CM | POA: Insufficient documentation

## 2010-09-08 DIAGNOSIS — I4891 Unspecified atrial fibrillation: Secondary | ICD-10-CM | POA: Insufficient documentation

## 2010-09-08 DIAGNOSIS — E119 Type 2 diabetes mellitus without complications: Secondary | ICD-10-CM | POA: Insufficient documentation

## 2010-09-08 DIAGNOSIS — R942 Abnormal results of pulmonary function studies: Secondary | ICD-10-CM | POA: Insufficient documentation

## 2010-09-08 DIAGNOSIS — Z8701 Personal history of pneumonia (recurrent): Secondary | ICD-10-CM | POA: Insufficient documentation

## 2010-09-08 DIAGNOSIS — I428 Other cardiomyopathies: Secondary | ICD-10-CM | POA: Insufficient documentation

## 2010-09-08 DIAGNOSIS — I509 Heart failure, unspecified: Secondary | ICD-10-CM | POA: Insufficient documentation

## 2010-09-08 DIAGNOSIS — R0609 Other forms of dyspnea: Secondary | ICD-10-CM | POA: Insufficient documentation

## 2010-09-08 DIAGNOSIS — D219 Benign neoplasm of connective and other soft tissue, unspecified: Secondary | ICD-10-CM | POA: Insufficient documentation

## 2010-09-08 DIAGNOSIS — R0989 Other specified symptoms and signs involving the circulatory and respiratory systems: Secondary | ICD-10-CM | POA: Insufficient documentation

## 2010-09-08 DIAGNOSIS — I10 Essential (primary) hypertension: Secondary | ICD-10-CM | POA: Insufficient documentation

## 2010-09-08 DIAGNOSIS — I4892 Unspecified atrial flutter: Secondary | ICD-10-CM | POA: Insufficient documentation

## 2010-09-10 ENCOUNTER — Encounter (HOSPITAL_COMMUNITY): Payer: BC Managed Care – PPO

## 2010-09-15 ENCOUNTER — Encounter (HOSPITAL_COMMUNITY): Payer: BC Managed Care – PPO

## 2010-09-15 ENCOUNTER — Observation Stay (HOSPITAL_COMMUNITY)
Admission: EM | Admit: 2010-09-15 | Discharge: 2010-09-16 | DRG: 139 | Disposition: A | Discharge status: Home / Self Care | Payer: BC Managed Care – PPO | Attending: Cardiovascular Disease | Admitting: Cardiovascular Disease

## 2010-09-15 ENCOUNTER — Emergency Department (HOSPITAL_COMMUNITY): Payer: BC Managed Care – PPO

## 2010-09-15 DIAGNOSIS — G4730 Sleep apnea, unspecified: Secondary | ICD-10-CM | POA: Insufficient documentation

## 2010-09-15 DIAGNOSIS — I4891 Unspecified atrial fibrillation: Principal | ICD-10-CM | POA: Insufficient documentation

## 2010-09-15 DIAGNOSIS — R0609 Other forms of dyspnea: Secondary | ICD-10-CM | POA: Insufficient documentation

## 2010-09-15 DIAGNOSIS — I2789 Other specified pulmonary heart diseases: Secondary | ICD-10-CM | POA: Insufficient documentation

## 2010-09-15 DIAGNOSIS — E119 Type 2 diabetes mellitus without complications: Secondary | ICD-10-CM | POA: Insufficient documentation

## 2010-09-15 DIAGNOSIS — E785 Hyperlipidemia, unspecified: Secondary | ICD-10-CM | POA: Insufficient documentation

## 2010-09-15 DIAGNOSIS — I359 Nonrheumatic aortic valve disorder, unspecified: Secondary | ICD-10-CM | POA: Insufficient documentation

## 2010-09-15 DIAGNOSIS — I10 Essential (primary) hypertension: Secondary | ICD-10-CM | POA: Insufficient documentation

## 2010-09-15 DIAGNOSIS — R0602 Shortness of breath: Secondary | ICD-10-CM | POA: Insufficient documentation

## 2010-09-15 DIAGNOSIS — I428 Other cardiomyopathies: Secondary | ICD-10-CM | POA: Insufficient documentation

## 2010-09-15 DIAGNOSIS — R0989 Other specified symptoms and signs involving the circulatory and respiratory systems: Secondary | ICD-10-CM | POA: Insufficient documentation

## 2010-09-15 LAB — COMPREHENSIVE METABOLIC PANEL
AST: 23 U/L (ref 0–37)
Albumin: 3.8 g/dL (ref 3.5–5.2)
Alkaline Phosphatase: 44 U/L (ref 39–117)
Chloride: 109 mEq/L (ref 96–112)
GFR calc Af Amer: >60 mL/min (ref >60)
Potassium: 4 mEq/L (ref 3.5–5.1)
Total Bilirubin: 0.6 mg/dL (ref 0.3–1.2)

## 2010-09-15 LAB — CBC
MCH: 25.7 pg — ABNORMAL LOW (ref 26.0–34.0)
MCHC: 32.2 g/dL (ref 30.0–36.0)
Platelets: 224 10*3/uL (ref 150–400)
RBC: 5.33 MIL/uL (ref 4.22–5.81)

## 2010-09-15 LAB — URINALYSIS, ROUTINE W REFLEX MICROSCOPIC
Glucose, UA: NEGATIVE mg/dL
Hgb urine dipstick: NEGATIVE
Specific Gravity, Urine: 1.019 (ref 1.005–1.030)
pH: 5 (ref 5.0–8.0)

## 2010-09-15 LAB — DIFFERENTIAL
Basophils Absolute: 0 10*3/uL (ref 0.0–0.1)
Basophils Relative: 1 % (ref 0–1)
Eosinophils Absolute: 0.1 10*3/uL (ref 0.0–0.7)
Monocytes Relative: 13 % — ABNORMAL HIGH (ref 3–12)
Neutrophils Relative %: 27 % — ABNORMAL LOW (ref 43–77)

## 2010-09-15 LAB — POCT I-STAT, CHEM 8
BUN: 20 mg/dL (ref 6–23)
Calcium, Ion: 1.15 mmol/L (ref 1.12–1.32)
HCT: 44 % (ref 39.0–52.0)
Hemoglobin: 15 g/dL (ref 13.0–17.0)
Sodium: 141 mEq/L (ref 135–145)
TCO2: 18 mmol/L (ref 0–100)

## 2010-09-15 LAB — POCT CARDIAC MARKERS
Myoglobin, poc: 65.4 ng/mL (ref 12–200)
Troponin i, poc: <0.05 ng/mL (ref 0.00–0.09)

## 2010-09-15 LAB — DIGOXIN LEVEL: Digoxin Level: 0.2 ng/mL — ABNORMAL LOW (ref 0.8–2.0)

## 2010-09-15 LAB — GLUCOSE, CAPILLARY: Glucose-Capillary: 129 mg/dL — ABNORMAL HIGH (ref 70–99)

## 2010-09-15 LAB — HEMOGLOBIN A1C
Hgb A1c MFr Bld: 7.6 % — ABNORMAL HIGH (ref <5.7)
Mean Plasma Glucose: 171 mg/dL — ABNORMAL HIGH (ref <117)

## 2010-09-16 LAB — CBC
MCH: 25.5 pg — ABNORMAL LOW (ref 26.0–34.0)
MCV: 81 fL (ref 78.0–100.0)
Platelets: 230 10*3/uL (ref 150–400)
RBC: 4.83 MIL/uL (ref 4.22–5.81)
RDW: 14.9 % (ref 11.5–15.5)

## 2010-09-16 LAB — COMPREHENSIVE METABOLIC PANEL
Alkaline Phosphatase: 45 U/L (ref 39–117)
BUN: 19 mg/dL (ref 6–23)
Glucose, Bld: 155 mg/dL — ABNORMAL HIGH (ref 70–99)
Potassium: 4.1 mEq/L (ref 3.5–5.1)
Total Protein: 6.5 g/dL (ref 6.0–8.3)

## 2010-09-16 LAB — LIPID PANEL
Cholesterol: 104 mg/dL (ref 0–200)
LDL Cholesterol: 30 mg/dL (ref 0–99)
Total CHOL/HDL Ratio: 3.5 RATIO

## 2010-09-16 LAB — HEMOGLOBIN A1C
Hgb A1c MFr Bld: 7.6 % — ABNORMAL HIGH (ref <5.7)
Mean Plasma Glucose: 171 mg/dL — ABNORMAL HIGH (ref <117)

## 2010-09-16 LAB — GLUCOSE, CAPILLARY: Glucose-Capillary: 142 mg/dL — ABNORMAL HIGH (ref 70–99)

## 2010-09-17 ENCOUNTER — Encounter (HOSPITAL_COMMUNITY): Payer: BC Managed Care – PPO

## 2010-09-22 ENCOUNTER — Encounter (HOSPITAL_COMMUNITY): Payer: BC Managed Care – PPO

## 2010-09-24 ENCOUNTER — Other Ambulatory Visit: Payer: Self-pay

## 2010-09-24 ENCOUNTER — Encounter (HOSPITAL_COMMUNITY): Payer: BC Managed Care – PPO

## 2010-09-24 MED ORDER — GLYBURIDE 5 MG PO TABS: 5.0000 mg | ORAL_TABLET | Freq: Every day | ORAL | Status: DC

## 2010-09-27 ENCOUNTER — Telehealth: Payer: Self-pay | Admitting: Pulmonary Disease

## 2010-09-27 NOTE — Telephone Encounter (Signed)
Download on 10 cm 2/28-3/28/12 >> few residual events, some central apneas persist Stay on this pressure, hope he is feeling better

## 2010-09-28 NOTE — Telephone Encounter (Signed)
lmomtcb x1 

## 2010-09-28 NOTE — Telephone Encounter (Signed)
OK to give ambien 5-10 mg q hs prn insomnia x 30  No need for early appt.

## 2010-09-28 NOTE — Telephone Encounter (Signed)
Pt c/o SOB, insomnia, Melatonin not working, and requesting Ambien. He also feels he needs an appointment. Has an appointment with Cards today. Your first available if May 10th. Please advise. Thanks

## 2010-09-28 NOTE — Telephone Encounter (Signed)
lmomtcb x1 at [phone] Need to verify what pharmacy this needs to be called into

## 2010-09-29 ENCOUNTER — Encounter (HOSPITAL_COMMUNITY): Payer: BC Managed Care – PPO

## 2010-09-30 NOTE — H&P (Signed)
NAME:  [name], ROUTON NO.:  [id number]  MEDICAL RECORD NO.:  [id number]           PATIENT TYPE:  E  LOCATION:  [facility]                         FACILITY:  [facility]  PHYSICIAN:  Nanetta Batty, M.D.   DATE OF BIRTH:  1950-09-02  DATE OF ADMISSION:  09/15/2010 DATE OF DISCHARGE:                             HISTORY & PHYSICAL   CHIEF COMPLAINT:  Shortness of breath.  HISTORY OF PRESENT ILLNESS:  Mr. Duthie is a pleasant 60 year old male, who we initially saw on May 2011.  He was admitted then for pneumonia and was found to be in atrial fibrillation with rapid ventricular response.  Workup during that hospitalization revealed cardiomyopathy, Dr. Royann Shivers feels this may be takotsubo.  His EF was 35% by echocardiogram.  At catheterization, he had normal coronaries with an EF of 40%-45%.  He was put on amiodarone and Coumadin and cardioverted.  As an outpatient, he had problems tolerating amiodarone.  Since he was maintaining sinus rhythm, his amiodarone was discontinued and then later his Coumadin was discontinued in November 2011.  He feels like he has been holding sinus rhythm since then although he cannot always tell when he is in atrial fibrillation.  An echocardiogram done in June 2011 in our office showed his EF had now improved to greater than 55%.  He had pseudonormalization on spectral Doppler.  There was aortic valve sclerosis with moderate aortic regurgitation.  There was mild-to- moderate pulmonary regurgitation.  RV pressure was elevated at 38 mmHg. He has done well according to his history.  He last saw Dr. Royann Shivers in February.  He has had problems with hypertension, which has been resistant to medication.  There does seem to be a white coat component to his hypertension.  The patient has been followed by Dr. Marchelle Gearing since May 2011.  He has had chronic dyspnea and has been unable to return to work.  He does have sleep apnea for sleep study.  The  patient is now seen in the emergency room with rapid atrial fibrillation, which apparently is new.  He presented to pulmonary rehab today and was found to be tachycardic.  The patient did note some dyspnea, but did not feel like he had palpitations and could not tell his heart rhythm was racing. He says last night, he took his blood pressure and his heart rate was fine.  He is admitted now for further evaluation and treatment.  PAST MEDICAL HISTORY:  Remarkable for type 2 non-insulin-dependent diabetes.  He has treated hypertension which has been difficult to control.  He has dyslipidemia.  He has sleep apnea as noted above.  HOME MEDICATIONS: 1. Combivent inhaler 2 puffs b.i.d. 2. Crestor 20 mg a day. 3. Diltiazem 240 mg a day. 4. Glyburide 5 mg b.i.d. 5. Lisinopril 40 mg a day. 6. Meloxicam 7.5 mg b.i.d. 7. Metformin 1 g b.i.d. 8. Omeprazole 40 mg a day. 9. Zolpidem 10 mg a day. 10.Lasix 20 mg a day. 11.Potassium 10 mEq a day. 12.Metoprolol 50 mg b.i.d.  ALLERGIES:  He is allergic to SULFA and intolerant to AMIODARONE.  SOCIAL HISTORY:  He is married, he  quit smoking in the early 80s.  He has 4 children.  FAMILY HISTORY:  Remarkable for coronary artery disease and diabetes.  REVIEW OF SYSTEMS:  Essentially unremarkable except for noted above, he has not had recent fever or chills.  PHYSICAL EXAMINATION:  VITAL SIGNS:  Blood pressure 126/86, heart rate 148, respirations 12. GENERAL:  He is a well-developed, well-nourished overweight African American male in no acute distress. HEENT:  Normocephalic.  Extraocular movements are intact.  Sclerae are anicteric.  Lids and conjunctivae are within normal limits. NECK:  Without JVD or bruit. CHEST:  Reveals decreased breath sounds but no wheezing or rales. CARDIAC:  Reveals irregularly irregular rhythm with increased ventricular response. ABDOMEN:  Not tender, not distended. EXTREMITIES:  Reveal trace edema bilaterally with  intact distal pulses. NEURO:  Grossly intact.  He is awake, alert, oriented, and cooperative. Moves all extremities without obvious deficit. SKIN:  Cool and dry.  LABORATORY DATA:  Labs are pending.  EKG shows atrial fibrillation with increased ventricular response.  IMPRESSION: 1. Recurrent atrial fibrillation. 2. History of cardiomyopathy, EF was 35% when initially seen in May     2011, echocardiogram as an outpatient in June 2011 and sinus rhythm     shows EF to be greater than 55%. 3. History of hypertension with diastolic dysfunction. 4. Type 2 non-insulin-dependent diabetes. 5. Dyslipidemia. 6. Pulmonary hypertension and chronic dyspnea. 7. Sleep apnea, on CPAP.  PLAN:  The patient will be admitted to telemetry, started on IV diltiazem for rate control.  He will also need heparin and will probably need long-term Coumadin now.  He was taken off amiodarone previously because of some side effects.     Abelino Derrick, P.A.   ______________________________ Nanetta Batty, M.D.    Lenard Lance  D:  09/15/2010  T:  09/15/2010  Job:  [id number]  cc:   Kalman Shan, MD Danise Edge, MD Thurmon Fair, MD  Electronically Signed by Corine Shelter P.A. on 09/23/2010 03:02:16 PM Electronically Signed by Nanetta Batty M.D. on 09/30/2010 04:10:19 PM

## 2010-09-30 NOTE — Discharge Summary (Signed)
[name], [name] NO.:  [id number]  MEDICAL RECORD NO.:  [id number]           PATIENT TYPE:  I  LOCATION:  2005                         FACILITY:  [facility]  PHYSICIAN:  Nanetta Batty, M.D.   DATE OF BIRTH:  05/28/1951  DATE OF ADMISSION:  09/15/2010 DATE OF DISCHARGE:  09/16/2010                              DISCHARGE SUMMARY   DISCHARGE DIAGNOSES: 1. Atrial fibrillation with rapid ventricular response, currently with     controlled ventricular rate. 2. Cardiomyopathy history, ejection fraction of 55% in June 2011. 3. Hypertension. 4. Diabetes mellitus type 2. 5. Dyslipidemia. 6. Pulmonary hypertension.  RV pressure 38 mmHg. 7. Sleep apnea [name] CPAP.  HOSPITAL COURSE:  [name] is a 60 year old male with a history of atrial fibrillation who was seen back in May 2011, and initially put [name] amiodarone and Coumadin and cardioverted.  He was tolerating the amiodarone very well and was in sinus rhythm since amiodarone was discontinued.  His Coumadin had been discontinued in November 2011.  The patient is unsure whether or not he has been going in and out of atrial fibrillation because he can only tell.  An echocardiogram in June 2011 at [facility] which showed an ejection fraction of 25%.  PAST MEDICAL HISTORY:  Also includes dyslipidemia, sleep apnea, hypertension, diabetes type 2.  He presented to the emergency room with atrial fibrillation RVR after being seen at Pulmonary Rehab and found to be tachycardic.  He was admitted for rate control.  He was also started [name] IV heparin and IV diltiazem at 5 mg per hour.  Currently, the patient is doing fine with no complaints.  His rate is controlled. He is still in atrial fibrillation.  He has been seen by Dr. Allyson Sabal.  He feels he is stable for discharge home, will go home [name] Lovenox-Coumadin bridge.  He also go home [name] Rythmol and his home dose of diltiazem 240 mg.  He has been seen by Dr.  Allyson Sabal who felt he was stable for discharge.  DISCHARGE LABS:  WBC 6.1, hemoglobin 12.3, hematocrit 39.1, platelets 230.  Sodium 143, potassium 4.1, chloride 108, carbon dioxide 24, BUN 19, creatinine 1.33, glucose 155.  Total bilirubin 0.7, alkaline phosphatase 45, AST 18, ALT 17, total protein 6.5, albumin 3.5, calcium 9.5.  A1c 7.6.  BNP 302.0.  Total cholesterol 104, triglycerides 219, HDL 30, LDL 30, VLDL 44, the ratio of 3.5.  TSH 1.458.  Urinalysis negative.  STUDIES/PROCEDURES: 1. Chest x-ray, heart is mildly enlarged.  No confluent airspace     opacities or effusions.  No acute bony abnormalities.  DISCHARGE MEDICATIONS: 1. Lovenox 100 mg per injection, 1 injection twice daily for 5 days. 2. Propafenone 225 mg 1 tablet by mouth twice daily. 3. Warfarin 7.5 mg 1 tablet by mouth daily. 4. Ambien 10 mg 1 tablet by mouth daily at bedtime as needed. 5. Aspirin enteric coated 81 mg 1 tablet by mouth daily. 6. Calcium carbonate over the counter 1 tablet by mouth 3 times a day     before meals as needed for  indigestion. 7. Combivent inhaler 2 puffs  inhaled every 4 hours as needed for     wheezing. 8. Diltiazem 240 mg 1 tablet by mouth daily. 9. Furosemide 20 mg 1 tablet by mouth twice daily. 10.Glyburide 5 mg 1 tablet by mouth twice daily. 11.Lisinopril 40 mg 1 tablet by mouth daily. 12.Metformin 1000 mg 1 tablet by mouth twice daily. 13.Metoprolol XL succinate 50 mg 1 tablet by mouth daily. 14.Prilosec 40 mg 1 tablet by mouth daily. 15.Potassium chloride 20 mEq 1 tablet by mouth daily. 16.Rosuvastatin 20 mg 1 tablet by mouth daily.  DISPOSITION:  [name] was discharged home in stable condition.  It was recommend he eat low-sodium, heart-healthy diet.  He will follow up with Dr. Royann Shivers in approximately 2 weeks and he will follow up with our office for an INR check [name], September 18, 2010.    ______________________________ Wilburt Finlay,  PA   ______________________________ Nanetta Batty, M.D.    BH/MEDQ  D:  09/16/2010  T:  09/17/2010  Job:  [id number]  Electronically Signed by Wilburt Finlay PA [name] 09/18/2010 04:28:50 PM Electronically Signed by Nanetta Batty M.D. [name] 09/30/2010 04:10:12 PM

## 2010-10-01 ENCOUNTER — Encounter (HOSPITAL_COMMUNITY): Payer: BC Managed Care – PPO

## 2010-10-06 ENCOUNTER — Encounter (HOSPITAL_COMMUNITY): Payer: BC Managed Care – PPO | Attending: Internal Medicine

## 2010-10-06 ENCOUNTER — Encounter (HOSPITAL_COMMUNITY): Payer: BC Managed Care – PPO

## 2010-10-06 DIAGNOSIS — R0989 Other specified symptoms and signs involving the circulatory and respiratory systems: Secondary | ICD-10-CM | POA: Insufficient documentation

## 2010-10-06 DIAGNOSIS — E119 Type 2 diabetes mellitus without complications: Secondary | ICD-10-CM | POA: Insufficient documentation

## 2010-10-06 DIAGNOSIS — I509 Heart failure, unspecified: Secondary | ICD-10-CM | POA: Insufficient documentation

## 2010-10-06 DIAGNOSIS — I4892 Unspecified atrial flutter: Secondary | ICD-10-CM | POA: Insufficient documentation

## 2010-10-06 DIAGNOSIS — I4891 Unspecified atrial fibrillation: Secondary | ICD-10-CM | POA: Insufficient documentation

## 2010-10-06 DIAGNOSIS — R942 Abnormal results of pulmonary function studies: Secondary | ICD-10-CM | POA: Insufficient documentation

## 2010-10-06 DIAGNOSIS — I10 Essential (primary) hypertension: Secondary | ICD-10-CM | POA: Insufficient documentation

## 2010-10-06 DIAGNOSIS — Z5189 Encounter for other specified aftercare: Secondary | ICD-10-CM | POA: Insufficient documentation

## 2010-10-06 DIAGNOSIS — I428 Other cardiomyopathies: Secondary | ICD-10-CM | POA: Insufficient documentation

## 2010-10-06 DIAGNOSIS — G4733 Obstructive sleep apnea (adult) (pediatric): Secondary | ICD-10-CM | POA: Insufficient documentation

## 2010-10-06 DIAGNOSIS — D219 Benign neoplasm of connective and other soft tissue, unspecified: Secondary | ICD-10-CM | POA: Insufficient documentation

## 2010-10-06 DIAGNOSIS — I5032 Chronic diastolic (congestive) heart failure: Secondary | ICD-10-CM | POA: Insufficient documentation

## 2010-10-06 DIAGNOSIS — R0609 Other forms of dyspnea: Secondary | ICD-10-CM | POA: Insufficient documentation

## 2010-10-06 DIAGNOSIS — Z8701 Personal history of pneumonia (recurrent): Secondary | ICD-10-CM | POA: Insufficient documentation

## 2010-10-06 DIAGNOSIS — I359 Nonrheumatic aortic valve disorder, unspecified: Secondary | ICD-10-CM | POA: Insufficient documentation

## 2010-10-08 ENCOUNTER — Encounter (HOSPITAL_COMMUNITY): Payer: BC Managed Care – PPO

## 2010-10-12 ENCOUNTER — Encounter: Payer: Self-pay | Admitting: Family Medicine

## 2010-10-12 ENCOUNTER — Ambulatory Visit (INDEPENDENT_AMBULATORY_CARE_PROVIDER_SITE_OTHER): Payer: BC Managed Care – PPO | Admitting: Family Medicine

## 2010-10-12 VITALS — BP 134/86 | HR 77 | Temp 97.9°F | Ht 70.0 in | Wt 232.8 lb

## 2010-10-12 DIAGNOSIS — I10 Essential (primary) hypertension: Secondary | ICD-10-CM

## 2010-10-12 DIAGNOSIS — I4892 Unspecified atrial flutter: Secondary | ICD-10-CM

## 2010-10-12 DIAGNOSIS — R5383 Other fatigue: Secondary | ICD-10-CM

## 2010-10-12 DIAGNOSIS — E119 Type 2 diabetes mellitus without complications: Secondary | ICD-10-CM

## 2010-10-12 DIAGNOSIS — G4730 Sleep apnea, unspecified: Secondary | ICD-10-CM

## 2010-10-12 DIAGNOSIS — D649 Anemia, unspecified: Secondary | ICD-10-CM

## 2010-10-12 DIAGNOSIS — E785 Hyperlipidemia, unspecified: Secondary | ICD-10-CM

## 2010-10-12 DIAGNOSIS — G4700 Insomnia, unspecified: Secondary | ICD-10-CM

## 2010-10-12 DIAGNOSIS — R5381 Other malaise: Secondary | ICD-10-CM

## 2010-10-12 MED ORDER — TEMAZEPAM 15 MG PO CAPS: ORAL_CAPSULE | ORAL | Status: DC

## 2010-10-12 NOTE — Patient Instructions (Signed)
Diabetes, Type 2 Diabetes is a lasting (chronic) disease. In type 2 diabetes, the pancreas does not make enough insulin (a hormone), and the body does not respond normally to the insulin that is made. This type of diabetes was also previously called adult onset diabetes. About 90% of all those who have diabetes have type 2. It usually occurs after the age of 40 but can occur at any age. CAUSES Unlike type 1 diabetes, which happens because insulin is no longer being made, type 2 diabetes happens because the body is making less insulin and has trouble using the insulin properly. SYMPTOMS  Drinking more than usual.   Urinating more than usual.   Blurred vision.   Dry, itchy skin.   Frequent infection like yeast infections in women.   More tired than usual (fatigue).  TREATMENT  Healthy eating.   Exercise.   Medication, if needed.   Monitoring blood glucose (sugar).   Seeing your caregiver regularly.  HOME CARE INSTRUCTIONS  Check your blood glucose (sugar) at least once daily. More frequent monitoring may be necessary, depending on your medications and on how well your diabetes is controlled. Your caregiver will advise you.   Take your medicine as directed by your caregiver.   Do not smoke.   Make wise food choices. Ask your caregiver for information. Weight loss can improve your diabetes.   Learn about low blood glucose (hypoglycemia) and how to treat it.   Get your eyes checked regularly.   Have a yearly physical exam. Have your blood pressure checked. Get your blood and urine tested.   Wear a pendant or bracelet saying that you have diabetes.   Check your feet every night for sores. Let your caregiver know if you have sores that are not healing.  SEEK MEDICAL CARE IF:  You are having problems keeping your blood glucose at target range.   You feel you might be having problems with your medicines.   You have symptoms of an illness that is not improving after 24  hours.   You have a sore or wound that is not healing.   You notice a change in vision or a new problem with your vision.   You develop a fever of more than 104.  Document Released: 05/24/2005 Document Re-Released: 06/15/2009 [employer] Patient Information 2011 [employer]. 

## 2010-10-13 ENCOUNTER — Encounter: Payer: Self-pay | Admitting: Family Medicine

## 2010-10-13 ENCOUNTER — Encounter (HOSPITAL_COMMUNITY): Payer: BC Managed Care – PPO

## 2010-10-13 DIAGNOSIS — E785 Hyperlipidemia, unspecified: Secondary | ICD-10-CM | POA: Insufficient documentation

## 2010-10-13 NOTE — Assessment & Plan Note (Signed)
Patient reports using CPAP regularly

## 2010-10-13 NOTE — Assessment & Plan Note (Signed)
Adequate control at today's visit. No changes to meds

## 2010-10-13 NOTE — Progress Notes (Signed)
[name] [id number] March 17, 1951 10/13/2010      Progress Note-Follow Up  Subjective  Chief Complaint  Chief Complaint  Patient presents with  . Follow-up    HPI  Patient is a 60 year old African American male in today for hospital followup. He was admitted to colon with an episode of atrial fibrillation with rapid ventricular response. He had been rushing to get to the point of pulmonary when he became significantly dyspneic had palpitations and was found to have a rapid pulse to a max of 180. He was in the hospital for several days his heart rate was medically managed and returned to normal and he was started on Coumadin and has been maintained on Coumadin. He is now followed with Coumadin clinic his last INR was on Monday and was 2.3 his Coumadin was started at 7.5 and has been titrated down to 4 mg daily next check is in 2 weeks. He reports his blood sugars have been well controlled recently his lowest is 168 his highest it's been around 100% in the morning. He does not feel symptomatic, diaphoretic or anxious when he hits 68. No recent febrile illness, congestion, chest pain. He is no longer suffering with palpitations or shortness of breath since his hospitalization. He is tolerating his Crestor and taking it regularly. He is using his CPAP regularly as well denies any headaches or acute complaints.  Past Medical History  Diagnosis Date  . Hypertension   . Hyperlipidemia   . Atrial fibrillation   . UNSPECIFIED ANEMIA 01/05/2010  . TESTICULAR HYPOFUNCTION 01/05/2010  . SLEEP APNEA 04/06/2010  . PULMONARY FUNCTION TESTS, ABNORMAL 02/02/2010  . PERIPHERAL NEUROPATHY 01/05/2010  . OBESITY NOS 01/25/2007  . NEUROMA 01/05/2010  . INSOMNIA, HX OF 01/25/2007  . HYPERTENSION 01/25/2007  . FATIGUE, CHRONIC 11/11/2009  . ERECTILE DYSFUNCTION 01/25/2007  . DYSPNEA 12/19/2009  . DM 12/05/2009  . CHF 12/05/2009  . CHEST PAIN-UNSPECIFIED 11/11/2009  . BENIGN PROSTATIC HYPERTROPHY, HX OF 12/05/2009  . Atrial  flutter 12/05/2009  . AORTIC STENOSIS, MODERATE 12/05/2009    Past Surgical History  Procedure Date  . Bilat foot surgery removed part of 5th metatarsal to corect curvature of toes     Family History  Problem Relation Age of Onset  . Clotting disorder Mother   . Heart disease Mother     s/p MI  . Heart attack Mother   . Hypertension Mother   . Diabetes Mother   . Hyperlipidemia Mother   . Stroke Mother   . Cancer Father     ? lung  . Lung disease Father     smoker  . Diabetes Sister   . Hypertension Sister     smoker  . Leukemia Maternal Grandmother     ?  [name] Aneurysm Sister     brain  . Other Sister     clipped  . Seizures Sister     d/o w/aneurysm/ smoker    History   Social History  . Marital Status: Married    Spouse Name: N/A    Number of Children: N/A  . Years of Education: N/A   Occupational History  . Not on file.   Social History Main Topics  . Smoking status: Former Smoker -- 1.0 packs/day for 10 years    Quit date: 06/07/1980  . Smokeless tobacco: Not on file  . Alcohol Use: No  . Drug Use: Not on file  . Sexually Active:    Other Topics Concern  . Not  on file   Social History Narrative  . No narrative on file    Current Outpatient Prescriptions on File Prior to Visit  Medication Sig Dispense Refill  . albuterol-ipratropium (COMBIVENT) 18-103 MCG/ACT inhaler Inhale 2 puffs into the lungs every 4 (four) hours as needed.        [name] aspirin 81 MG tablet Take 81 mg by mouth daily.        . colchicine 0.6 MG tablet Take 0.6 mg by mouth daily. 1 tab po q2 hrs prn for pain to a max of 6 tabs/24 hr, pain relief or diarrhea       . diltiazem (CARDIZEM CD) 240 MG 24 hr capsule Take 240 mg by mouth daily.        . fish oil-omega-3 fatty acids 1000 MG capsule Take 2 g by mouth daily.        . furosemide (LASIX) 40 MG tablet Take 40 mg by mouth as directed. 1/2 once daily and 1 extra if weight is over 196       . gabapentin (NEURONTIN) 100 MG capsule Take  100 mg by mouth as directed. 1 capsule po qhs and titrate up to 3 tabs qhs as tolerated daily       . glyBURIDE (DIABETA) 5 MG tablet Take 1 tablet (5 mg total) by mouth daily.  30 tablet  1  . lisinopril (PRINIVIL,ZESTRIL) 20 MG tablet Take 20 mg by mouth 2 (two) times daily.        . meloxicam (MOBIC) 7.5 MG tablet Take 7.5 mg by mouth 2 (two) times daily. 1 tab po bid prn for pain with food       . metFORMIN (GLUCOPHAGE) 1000 MG tablet Take 1,000 mg by mouth 2 (two) times daily.        . metoprolol (TOPROL-XL) 50 MG 24 hr tablet Take 50 mg by mouth daily.       . multivitamin (THERAGRAN) per tablet Take 1 tablet by mouth daily.        [name] omeprazole (PRILOSEC) 40 MG capsule Take 40 mg by mouth daily.        . potassium chloride SA (K-DUR,KLOR-CON) 20 MEQ tablet Take 20 mEq by mouth daily. 1/2 tab po       . rosuvastatin (CRESTOR) 20 MG tablet Take 20 mg by mouth daily.          Allergies  Allergen Reactions  . Sulfonamide Derivatives     REACTION: unknown reaction    Review of Systems  Review of Systems  Constitutional: Positive for malaise/fatigue. Negative for fever.  HENT: Negative for congestion.   Eyes: Negative for discharge.  Respiratory: Positive for shortness of breath.        [Now he is back at his baseline, was very dyspneic when he was in afib with rvr Cardiovascular: Negative for chest pain, palpitations and leg swelling.  Gastrointestinal: Negative for nausea, abdominal pain and diarrhea.  Genitourinary: Negative for dysuria.  Musculoskeletal: Negative for falls.  Skin: Negative for rash.  Neurological: Negative for loss of consciousness and headaches.  Endo/Heme/Allergies: Negative for polydipsia.  Psychiatric/Behavioral: Negative for depression and suicidal ideas. The patient is not nervous/anxious and does not have insomnia.     Objective  BP 134/86  Pulse 77  Temp(Src) 97.9 F (36.6 C) (Oral)  Ht 5\' 10"  (1.778 m)  Wt 232 lb 12.8 oz (105.597 kg)  BMI  33.40 kg/m2  SpO2 98%  Physical Exam  Physical Exam  Constitutional:  He is oriented to person, place, and time and well-developed, well-nourished, and in no distress. No distress.  HENT:  Head: Normocephalic and atraumatic.  Eyes: Conjunctivae are normal.  Neck: Neck supple. No thyromegaly present.  Cardiovascular: Normal rate and regular rhythm.   Murmur heard.      1/6 systolic murmur  Pulmonary/Chest: Effort normal and breath sounds normal. No respiratory distress.  Abdominal: He exhibits no distension and no mass. There is no tenderness.  Musculoskeletal: He exhibits no edema.  Neurological: He is alert and oriented to person, place, and time.  Skin: Skin is warm.  Psychiatric: Memory, affect and judgment normal.    Lab Results  Component Value Date   TSH 1.458 09/15/2010   Lab Results  Component Value Date   WBC 6.1 09/16/2010   HGB 12.3 DELTA CHECK NOTED* 09/16/2010   HCT 39.1 09/16/2010   MCV 81.0 09/16/2010   PLT 230 09/16/2010   Lab Results  Component Value Date   CREATININE 1.33 09/16/2010   BUN 19 09/16/2010   NA 143 09/16/2010   K 4.1 09/16/2010   CL 108 09/16/2010   CO2 24 09/16/2010   Lab Results  Component Value Date   ALT 17 09/16/2010   AST 18 09/16/2010   ALKPHOS 45 09/16/2010   BILITOT 0.7 09/16/2010   Lab Results  Component Value Date   CHOL  Value: 104        ATP III CLASSIFICATION:  <200     mg/dL   Desirable  604-540  mg/dL   Borderline High  >=981    mg/dL   High        1/91/4782   Lab Results  Component Value Date   HDL 30* 09/16/2010   Lab Results  Component Value Date   LDLCALC  Value: 30        Total Cholesterol/HDL:CHD Risk Coronary Heart Disease Risk Table                     Men   Women  1/2 Average Risk   3.4   3.3  Average Risk       5.0   4.4  2 X Average Risk   9.6   7.1  3 X Average Risk  23.4   11.0        Use the calculated Patient Ratio above and the CHD Risk Table to determine the patient's CHD Risk.        ATP III CLASSIFICATION  (LDL):  <100     mg/dL   Optimal  956-213  mg/dL   Near or Above                    Optimal  130-159  mg/dL   Borderline  086-578  mg/dL   High  >469     mg/dL   Very High 12/04/5282   Lab Results  Component Value Date   TRIG 219* 09/16/2010   Lab Results  Component Value Date   CHOLHDL 3.5 09/16/2010     Assessment & Plan  UNSPECIFIED ANEMIA Needs to repeat CBC at next visit to further evaluate  SLEEP APNEA Patient reports using CPAP regularly  HYPERTENSION Adequate control at today's visit. No changes to meds  FATIGUE, CHRONIC Multifactorial and related to poor sleep, sleep apnea and heart disease and DM. Encouraged regular use of CPAP, good diet, and continue with cardiology and rehab. Will treat Insomnia now that he is using his CPAP regularly  Hyperlipidemia Tolerating Crestor, no changes  DM Patient reports recent good control, his recent am numbers are ranging between 68 and 100. He does not feel sweaty or uncomfortable when he hits the 60s. No changes to meds, eat small frequent meals with complex carbs and lean proteins and monitor [id number]  ATRIAL FLUTTER Recently hospitalized for an episode of Afib with RVR. Episode occurred when he was rushing to get to rehab on time, was hospitalized for several days but since being home he has not had any further episodes. He is now on Coumadin permanently and following with Coumadin clinic, no changes

## 2010-10-13 NOTE — Assessment & Plan Note (Signed)
Needs to repeat CBC at next visit to further evaluate

## 2010-10-13 NOTE — Assessment & Plan Note (Signed)
Patient reports recent good control, his recent am numbers are ranging between 68 and 100. He does not feel sweaty or uncomfortable when he hits the 60s. No changes to meds, eat small frequent meals with complex carbs and lean proteins and monitor [id number]

## 2010-10-13 NOTE — Assessment & Plan Note (Signed)
Multifactorial and related to poor sleep, sleep apnea and heart disease and DM. Encouraged regular use of CPAP, good diet, and continue with cardiology and rehab. Will treat Insomnia now that he is using his CPAP regularly

## 2010-10-13 NOTE — Assessment & Plan Note (Signed)
Recently hospitalized for an episode of Afib with RVR. Episode occurred when he was rushing to get to rehab on time, was hospitalized for several days but since being home he has not had any further episodes. He is now on Coumadin permanently and following with Coumadin clinic, no changes

## 2010-10-13 NOTE — Assessment & Plan Note (Signed)
Tolerating Crestor, no changes 

## 2010-10-15 ENCOUNTER — Encounter (HOSPITAL_COMMUNITY): Payer: BC Managed Care – PPO

## 2010-10-20 ENCOUNTER — Encounter (HOSPITAL_COMMUNITY): Payer: BC Managed Care – PPO

## 2010-10-22 ENCOUNTER — Encounter (HOSPITAL_COMMUNITY): Payer: BC Managed Care – PPO

## 2010-10-27 ENCOUNTER — Encounter (HOSPITAL_COMMUNITY): Payer: BC Managed Care – PPO

## 2010-10-29 ENCOUNTER — Encounter (HOSPITAL_COMMUNITY): Payer: BC Managed Care – PPO

## 2010-10-29 ENCOUNTER — Other Ambulatory Visit: Payer: Self-pay | Admitting: Family Medicine

## 2010-10-29 ENCOUNTER — Encounter: Payer: Self-pay | Admitting: Pulmonary Disease

## 2010-10-29 MED ORDER — GLYBURIDE 5 MG PO TABS: 5.0000 mg | ORAL_TABLET | Freq: Two times a day (BID) | ORAL | Status: DC

## 2010-10-29 MED ORDER — METFORMIN HCL 1000 MG PO TABS: 1000.0000 mg | ORAL_TABLET | Freq: Two times a day (BID) | ORAL | Status: DC

## 2010-11-05 ENCOUNTER — Encounter (HOSPITAL_COMMUNITY): Payer: BC Managed Care – PPO | Attending: Internal Medicine

## 2010-11-05 DIAGNOSIS — R942 Abnormal results of pulmonary function studies: Secondary | ICD-10-CM | POA: Insufficient documentation

## 2010-11-05 DIAGNOSIS — D219 Benign neoplasm of connective and other soft tissue, unspecified: Secondary | ICD-10-CM | POA: Insufficient documentation

## 2010-11-05 DIAGNOSIS — Z5189 Encounter for other specified aftercare: Secondary | ICD-10-CM | POA: Insufficient documentation

## 2010-11-05 DIAGNOSIS — I4891 Unspecified atrial fibrillation: Secondary | ICD-10-CM | POA: Insufficient documentation

## 2010-11-05 DIAGNOSIS — R0989 Other specified symptoms and signs involving the circulatory and respiratory systems: Secondary | ICD-10-CM | POA: Insufficient documentation

## 2010-11-05 DIAGNOSIS — E119 Type 2 diabetes mellitus without complications: Secondary | ICD-10-CM | POA: Insufficient documentation

## 2010-11-05 DIAGNOSIS — I5032 Chronic diastolic (congestive) heart failure: Secondary | ICD-10-CM | POA: Insufficient documentation

## 2010-11-05 DIAGNOSIS — I10 Essential (primary) hypertension: Secondary | ICD-10-CM | POA: Insufficient documentation

## 2010-11-05 DIAGNOSIS — I4892 Unspecified atrial flutter: Secondary | ICD-10-CM | POA: Insufficient documentation

## 2010-11-05 DIAGNOSIS — Z8701 Personal history of pneumonia (recurrent): Secondary | ICD-10-CM | POA: Insufficient documentation

## 2010-11-05 DIAGNOSIS — I428 Other cardiomyopathies: Secondary | ICD-10-CM | POA: Insufficient documentation

## 2010-11-05 DIAGNOSIS — R0609 Other forms of dyspnea: Secondary | ICD-10-CM | POA: Insufficient documentation

## 2010-11-05 DIAGNOSIS — I359 Nonrheumatic aortic valve disorder, unspecified: Secondary | ICD-10-CM | POA: Insufficient documentation

## 2010-11-05 DIAGNOSIS — I509 Heart failure, unspecified: Secondary | ICD-10-CM | POA: Insufficient documentation

## 2010-11-05 DIAGNOSIS — G4733 Obstructive sleep apnea (adult) (pediatric): Secondary | ICD-10-CM | POA: Insufficient documentation

## 2010-12-14 ENCOUNTER — Other Ambulatory Visit (INDEPENDENT_AMBULATORY_CARE_PROVIDER_SITE_OTHER): Payer: BC Managed Care – PPO

## 2010-12-14 DIAGNOSIS — I2510 Atherosclerotic heart disease of native coronary artery without angina pectoris: Secondary | ICD-10-CM

## 2010-12-14 DIAGNOSIS — E119 Type 2 diabetes mellitus without complications: Secondary | ICD-10-CM

## 2010-12-14 DIAGNOSIS — D649 Anemia, unspecified: Secondary | ICD-10-CM

## 2010-12-14 DIAGNOSIS — I509 Heart failure, unspecified: Secondary | ICD-10-CM

## 2010-12-14 LAB — HEPATIC FUNCTION PANEL
ALT: 32 U/L (ref 0–53)
Alkaline Phosphatase: 33 U/L — ABNORMAL LOW (ref 39–117)
Bilirubin, Direct: 0.1 mg/dL (ref 0.0–0.3)
Total Bilirubin: 0.5 mg/dL (ref 0.3–1.2)

## 2010-12-14 LAB — CBC WITH DIFFERENTIAL/PLATELET
Basophils Absolute: 0 10*3/uL (ref 0.0–0.1)
Eosinophils Relative: 1.7 % (ref 0.0–5.0)
HCT: 36.6 % — ABNORMAL LOW (ref 39.0–52.0)
Hemoglobin: 12.3 g/dL — ABNORMAL LOW (ref 13.0–17.0)
Lymphocytes Relative: 54 % — ABNORMAL HIGH (ref 12.0–46.0)
Lymphs Abs: 2.8 10*3/uL (ref 0.7–4.0)
Monocytes Relative: 13.6 % — ABNORMAL HIGH (ref 3.0–12.0)
Neutro Abs: 1.5 10*3/uL (ref 1.4–7.7)
RDW: 15.8 % — ABNORMAL HIGH (ref 11.5–14.6)
WBC: 5.1 10*3/uL (ref 4.5–10.5)

## 2010-12-14 LAB — RENAL FUNCTION PANEL
Albumin: 4.2 g/dL (ref 3.5–5.2)
BUN: 17 mg/dL (ref 6–23)
Calcium: 9.6 mg/dL (ref 8.4–10.5)
Chloride: 109 mEq/L (ref 96–112)
Potassium: 5.3 mEq/L — ABNORMAL HIGH (ref 3.5–5.1)

## 2010-12-14 LAB — TSH: TSH: 0.79 u[IU]/mL (ref 0.35–5.50)

## 2010-12-15 NOTE — Progress Notes (Signed)
Labs also mailed to patient

## 2010-12-21 ENCOUNTER — Other Ambulatory Visit (INDEPENDENT_AMBULATORY_CARE_PROVIDER_SITE_OTHER): Payer: BC Managed Care – PPO

## 2010-12-21 DIAGNOSIS — E875 Hyperkalemia: Secondary | ICD-10-CM

## 2010-12-21 LAB — RENAL FUNCTION PANEL
Albumin: 4.4 g/dL (ref 3.5–5.2)
CO2: 24 mEq/L (ref 19–32)
Calcium: 9.2 mg/dL (ref 8.4–10.5)
Potassium: 4.3 mEq/L (ref 3.5–5.1)
Sodium: 135 mEq/L (ref 135–145)

## 2010-12-22 LAB — LIPID PANEL
Cholesterol: 94 mg/dL (ref 0–200)
HDL: 36 mg/dL — ABNORMAL LOW (ref >39.00)
LDL Cholesterol: 38 mg/dL (ref 0–99)
Triglycerides: 100 mg/dL (ref 0.0–149.0)
VLDL: 20 mg/dL (ref 0.0–40.0)

## 2010-12-25 ENCOUNTER — Ambulatory Visit: Payer: BC Managed Care – PPO | Admitting: Family Medicine

## 2010-12-28 ENCOUNTER — Ambulatory Visit (INDEPENDENT_AMBULATORY_CARE_PROVIDER_SITE_OTHER): Payer: BC Managed Care – PPO | Admitting: Family Medicine

## 2010-12-28 ENCOUNTER — Encounter: Payer: Self-pay | Admitting: Family Medicine

## 2010-12-28 VITALS — BP 130/79 | HR 71 | Temp 98.1°F | Ht 71.0 in | Wt 234.0 lb

## 2010-12-28 DIAGNOSIS — M25561 Pain in right knee: Secondary | ICD-10-CM

## 2010-12-28 DIAGNOSIS — M25562 Pain in left knee: Secondary | ICD-10-CM | POA: Insufficient documentation

## 2010-12-28 DIAGNOSIS — M25569 Pain in unspecified knee: Secondary | ICD-10-CM

## 2010-12-28 DIAGNOSIS — E119 Type 2 diabetes mellitus without complications: Secondary | ICD-10-CM

## 2010-12-28 DIAGNOSIS — R5381 Other malaise: Secondary | ICD-10-CM

## 2010-12-28 DIAGNOSIS — E291 Testicular hypofunction: Secondary | ICD-10-CM

## 2010-12-28 DIAGNOSIS — R5383 Other fatigue: Secondary | ICD-10-CM

## 2010-12-28 DIAGNOSIS — G4730 Sleep apnea, unspecified: Secondary | ICD-10-CM

## 2010-12-28 DIAGNOSIS — E785 Hyperlipidemia, unspecified: Secondary | ICD-10-CM

## 2010-12-28 DIAGNOSIS — I10 Essential (primary) hypertension: Secondary | ICD-10-CM

## 2010-12-28 HISTORY — DX: Pain in right knee: M25.561

## 2010-12-28 MED ORDER — NAPROXEN 375 MG PO TABS: 375.0000 mg | ORAL_TABLET | Freq: Two times a day (BID) | ORAL | Status: DC

## 2010-12-28 NOTE — Assessment & Plan Note (Signed)
The patient has previously been following with [facility] and had been noted to have a testosterone most recently of 153. We will have him sign records to have that released here. Due to his history of prostate disease along with his testosterone dysfunction we will ask him to follow this further with urology for the time being

## 2010-12-28 NOTE — Assessment & Plan Note (Signed)
Multifactorial and related to his sleep apnea, heart condition, low testosterone, poor exercise. We'll continue to monitor the individual condition

## 2010-12-28 NOTE — Assessment & Plan Note (Signed)
Hemoglobin A1c 7.1. Patient has redoubled with efforts to minimize his carbohydrate intake and has not had any high numbers recently. We'll continue to monitor. No changes to medications.

## 2010-12-28 NOTE — Patient Instructions (Signed)
Knee Pain The knee is a joint between your thigh and lower leg. It is made up of bones, tendons, ligaments, and cartilage. Knee pain can be caused by many things. A few causes could be injury to the knee, gout, arthritis, infections, or overuse during physical activity. HOME CARE  Only take medicine as told by your doctor.   Keep a healthy weight. Being overweight can make the knee hurt more.   Stretch before exercising or playing sports.   If there is constant knee pain, change the way you or your child exercises. Ask your doctor for advice.   Make sure shoes fit well. Choose the right shoe for the sport or activity.   Protect the knees. Wear kneepads if needed.   Rest when tired.  GET HELP IF:  There is knee pain that does not stop.   There is knee pain that does not seem to be getting better.  GET HELP RIGHT AWAY IF:  The knee joint feels hot to the touch.   You or your child has a temperature by mouth above 104, not controlled by medicine.   Your baby is older than 3 months with a rectal temperature of 102 F (38.9 C) or higher.   Your baby is 31 months old or younger with a rectal temperature of 100.4 F (38 C) or higher.  MAKE SURE YOU:  Understand these instructions.   Will watch this condition.   Will get help right away if you or your child is not doing well or gets worse.  Document Released: 08/20/2008 Document Re-Released: 08/18/2009 [facility] Patient Information 2011 [place], [place].

## 2010-12-28 NOTE — Assessment & Plan Note (Signed)
Well controlled on current meds no changes today 

## 2010-12-28 NOTE — Progress Notes (Signed)
[name] [id number] 1951-03-25 12/28/2010      Progress Note-Follow Up  Subjective  Chief Complaint  No chief complaint on file.   HPI  Patient is a 60 year old African American male in today with multiple concerns. One is his persistent fatigue and his ongoing weight gain. He is trying to tear to the DASH diet and eating small frequent meals with lean proteins and complex carbs. He has tried to decrease his caloric intake and until his knee started bothering him more recently he been trying to walk routinely. Over the last 1-2 weeks the right knee pain has gotten bad enough that he stopped walking. That has helped the pain somewhat. Last week he was awoken from sleep by a very sharp pain just above his anterior right knee and the pain has been persistent to a lesser degree since then. No falls. He had some mild swelling mostly but that is resolved. No warmth or erythema. No obvious trauma or recent injury. Denies having had this knee evaluated in the past but is ready to do so now. He is also very frustrated with his persistent fatigue. Previously a light urology did warn him that he had a low testosterone but he also had an elevated PSA. He denies any urinary symptoms such as hesitancy hematuria or dysuria. He had previously been offered some penile injections by his urologist but chose not to proceed. No chest pain, palpitations, shortness of breath, recent illness, fevers, chills, GI complaints.  Past Medical History  Diagnosis Date  . Hypertension   . Hyperlipidemia   . Atrial fibrillation   . UNSPECIFIED ANEMIA 01/05/2010  . TESTICULAR HYPOFUNCTION 01/05/2010  . SLEEP APNEA 04/06/2010  . PULMONARY FUNCTION TESTS, ABNORMAL 02/02/2010  . PERIPHERAL NEUROPATHY 01/05/2010  . OBESITY NOS 01/25/2007  . NEUROMA 01/05/2010  . INSOMNIA, HX OF 01/25/2007  . HYPERTENSION 01/25/2007  . FATIGUE, CHRONIC 11/11/2009  . ERECTILE DYSFUNCTION 01/25/2007  . DYSPNEA 12/19/2009  . DM 12/05/2009  . CHF 12/05/2009   . CHEST PAIN-UNSPECIFIED 11/11/2009  . BENIGN PROSTATIC HYPERTROPHY, HX OF 12/05/2009  . Atrial flutter 12/05/2009  . AORTIC STENOSIS, MODERATE 12/05/2009  . Knee pain, right 12/28/2010    Past Surgical History  Procedure Date  . Bilat foot surgery removed part of 5th metatarsal to corect curvature of toes     Family History  Problem Relation Age of Onset  . Clotting disorder Mother   . Heart disease Mother     s/p MI  . Heart attack Mother   . Hypertension Mother   . Diabetes Mother   . Hyperlipidemia Mother   . Stroke Mother   . Cancer Father     ? lung  . Lung disease Father     smoker  . Diabetes Sister   . Hypertension Sister     smoker  . Leukemia Maternal Grandmother     ?  [name] Aneurysm Sister     brain  . Other Sister     clipped  . Seizures Sister     d/o w/aneurysm/ smoker    History   Social History  . Marital Status: Married    Spouse Name: N/A    Number of Children: N/A  . Years of Education: N/A   Occupational History  . Not on file.   Social History Main Topics  . Smoking status: Former Smoker -- 1.0 packs/day for 10 years    Quit date: 06/07/1980  . Smokeless tobacco: Not on file  . Alcohol  Use: No  . Drug Use: Not on file  . Sexually Active:    Other Topics Concern  . Not on file   Social History Narrative  . No narrative on file    Current Outpatient Prescriptions on File Prior to Visit  Medication Sig Dispense Refill  . albuterol-ipratropium (COMBIVENT) 18-103 MCG/ACT inhaler Inhale 2 puffs into the lungs every 4 (four) hours as needed.        [name] aspirin 81 MG tablet Take 81 mg by mouth daily.        . calcium carbonate (TUMS - DOSED IN MG ELEMENTAL CALCIUM) 500 MG chewable tablet Chew 1 tablet by mouth daily as needed.        . colchicine 0.6 MG tablet Take 0.6 mg by mouth daily. 1 tab po q2 hrs prn for pain to a max of 6 tabs/24 hr, pain relief or diarrhea       . diltiazem (CARDIZEM CD) 240 MG 24 hr capsule Take 240 mg by mouth daily.         . fish oil-omega-3 fatty acids 1000 MG capsule Take 2 g by mouth daily.        . furosemide (LASIX) 40 MG tablet Take 40 mg by mouth as directed. 1/2 once daily and 1 extra if weight is over 196       . gabapentin (NEURONTIN) 100 MG capsule Take 100 mg by mouth as directed. 1 capsule po qhs and titrate up to 3 tabs qhs as tolerated daily       . glyBURIDE (DIABETA) 5 MG tablet Take 1 tablet (5 mg total) by mouth 2 (two) times daily with a meal.  60 tablet  3  . lisinopril (PRINIVIL,ZESTRIL) 20 MG tablet Take 20 mg by mouth 2 (two) times daily.        . metFORMIN (GLUCOPHAGE) 1000 MG tablet Take 1 tablet (1,000 mg total) by mouth 2 (two) times daily.  30 tablet  3  . metoprolol (TOPROL-XL) 50 MG 24 hr tablet Take 50 mg by mouth daily.       . multivitamin (THERAGRAN) per tablet Take 1 tablet by mouth daily.        [name] omeprazole (PRILOSEC) 40 MG capsule Take 40 mg by mouth daily.        . potassium chloride SA (K-DUR,KLOR-CON) 20 MEQ tablet Take 20 mEq by mouth daily. 1/2 tab po       . rosuvastatin (CRESTOR) 20 MG tablet Take 20 mg by mouth daily.        . temazepam (RESTORIL) 15 MG capsule 1-2 caps po qhs prn insomnia Use only when wearing CPAP  60 capsule  2  . warfarin (COUMADIN) 4 MG tablet Take 4 mg by mouth daily.          Allergies  Allergen Reactions  . Sulfonamide Derivatives     REACTION: unknown reaction    Review of Systems  Review of Systems  Constitutional: Positive for activity change and fatigue. Negative for fever.  HENT: Negative for congestion and sore throat.   Eyes: Negative for discharge.  Respiratory: Negative for chest tightness and shortness of breath.   Cardiovascular: Negative for chest pain, palpitations and leg swelling.  Gastrointestinal: Negative for abdominal pain, diarrhea and abdominal distention.  Musculoskeletal: Positive for joint swelling and arthralgias.       Right knee worsening  Psychiatric/Behavioral: Negative for decreased  concentration and agitation. The patient is not nervous/anxious.     Objective  There were no vitals taken for this visit.  Physical Exam  Physical Exam  Constitutional: He is oriented to person, place, and time and well-developed, well-nourished, and in no distress. No distress.  HENT:  Head: Normocephalic and atraumatic.  Eyes: Conjunctivae are normal.  Neck: Neck supple. No thyromegaly present.  Cardiovascular: Normal rate.   Murmur heard. Pulmonary/Chest: Effort normal and breath sounds normal. No respiratory distress.  Abdominal: He exhibits no distension and no mass. There is no tenderness.  Musculoskeletal: Normal range of motion. He exhibits no edema and no tenderness.       B/l knees no ligamentous laxity, swelling or joint line tenderness on exam today  Neurological: He is alert and oriented to person, place, and time.  Skin: Skin is warm.  Psychiatric: Memory, affect and judgment normal.    Lab Results  Component Value Date   TSH 0.79 12/14/2010   Lab Results  Component Value Date   WBC 5.1 12/14/2010   HGB 12.3* 12/14/2010   HCT 36.6* 12/14/2010   MCV 79.5 12/14/2010   PLT 215.0 12/14/2010   Lab Results  Component Value Date   CREATININE 1.0 12/21/2010   BUN 19 12/21/2010   NA 135 12/21/2010   K 4.3 12/21/2010   CL 103 12/21/2010   CO2 24 12/21/2010   Lab Results  Component Value Date   ALT 32 12/14/2010   AST 25 12/14/2010   ALKPHOS 33* 12/14/2010   BILITOT 0.5 12/14/2010   Lab Results  Component Value Date   CHOL 94 12/21/2010   Lab Results  Component Value Date   HDL 36.00* 12/21/2010   Lab Results  Component Value Date   LDLCALC 38 12/21/2010   Lab Results  Component Value Date   TRIG 100.0 12/21/2010   Lab Results  Component Value Date   CHOLHDL 3 12/21/2010     Assessment & Plan  TESTICULAR HYPOFUNCTION The patient has previously been following with [facility] and had been noted to have a testosterone most recently of 153. We will have him sign  records to have that released here. Due to his history of prostate disease along with his testosterone dysfunction we will ask him to follow this further with urology for the time being  SLEEP APNEA Compliant with CPAP  HYPERTENSION Well controlled on current meds no changes today  DM Hemoglobin A1c 7.1. Patient has redoubled with efforts to minimize his carbohydrate intake and has not had any high numbers recently. We'll continue to monitor. No changes to medications.  FATIGUE, CHRONIC Multifactorial and related to his sleep apnea, heart condition, low testosterone, poor exercise. We'll continue to monitor the individual condition  Hyperlipidemia Tolerating Crestor and following with cardiology, had an appointment for the next month to followup with his cardiology care.  Knee pain, right Long history of pain intermittently but recently increased intensity and frequency of pain. Pain largely anterior. Most mild to last week but none now. The pain awoke him from sleep last week it has been persistent since that time. Is slowly improving. The lung scan was not helpful and diclofenac but is no longer helping. We will switch him to naproxen 375 twice a day his testicular food and to start this once daily dosing and we will set him up with an appointment for further evaluation and eventual screens for orthopedic at this time here

## 2010-12-28 NOTE — Assessment & Plan Note (Signed)
Long history of pain intermittently but recently increased intensity and frequency of pain. Pain largely anterior. Most mild to last week but none now. The pain awoke him from sleep last week it has been persistent since that time. Is slowly improving. The lung scan was not helpful and diclofenac but is no longer helping. We will switch him to naproxen 375 twice a day his testicular food and to start this once daily dosing and we will set him up with an appointment for further evaluation and eventual screens for orthopedic at this time here

## 2010-12-28 NOTE — Assessment & Plan Note (Signed)
Tolerating Crestor and following with cardiology, had an appointment for the next month to followup with his cardiology care.

## 2010-12-28 NOTE — Assessment & Plan Note (Signed)
Compliant with CPAP 

## 2010-12-30 ENCOUNTER — Encounter: Payer: Self-pay | Admitting: Family Medicine

## 2011-01-08 ENCOUNTER — Other Ambulatory Visit: Payer: Self-pay

## 2011-01-08 MED ORDER — METOPROLOL SUCCINATE ER 50 MG PO TB24: 50.0000 mg | ORAL_TABLET | Freq: Every day | ORAL | Status: DC

## 2011-02-12 ENCOUNTER — Encounter: Payer: Self-pay | Admitting: Vascular Surgery

## 2011-02-15 ENCOUNTER — Ambulatory Visit (INDEPENDENT_AMBULATORY_CARE_PROVIDER_SITE_OTHER): Payer: BC Managed Care – PPO | Admitting: Vascular Surgery

## 2011-02-15 ENCOUNTER — Encounter: Payer: Self-pay | Admitting: Vascular Surgery

## 2011-02-15 ENCOUNTER — Other Ambulatory Visit (INDEPENDENT_AMBULATORY_CARE_PROVIDER_SITE_OTHER): Payer: BC Managed Care – PPO | Admitting: *Deleted

## 2011-02-15 VITALS — BP 163/88 | HR 87 | Resp 20 | Ht 71.0 in | Wt 230.0 lb

## 2011-02-15 DIAGNOSIS — I83893 Varicose veins of bilateral lower extremities with other complications: Secondary | ICD-10-CM

## 2011-02-15 NOTE — Progress Notes (Signed)
Subjective:     Patient ID: [name], male   DOB: 18-Jan-1951, 60 y.o.   MRN: [id number]  HPI this 60 year old male patient was referred by Dr. Turner Daniels for varicose veins in the left leg and episodes of superficial thrombophlebitis. The patient states that varicose veins and present for many years in the left calf anteriorly and posteriorly. He has had multiple episodes of thrombophlebitis but no episodes of DVT. He denies any stasis ulcers bleeding but does have swelling in the left ankle and foot as the day progresses. It is our last compression stockings nor elevate his leg nor take pain medication on a regular basis. He developed aching throbbing and burning discomfort as the day progresses and particularly at night. He is on chronic Coumadin therapy for atrial fibrillation.  [facility] [place] HISTORY AND PHYSICAL   CC: Referring Physician:  History of Present Illness:  Past Medical History  Diagnosis Date  . Hypertension   . Hyperlipidemia   . Atrial fibrillation   . UNSPECIFIED ANEMIA 01/05/2010  . TESTICULAR HYPOFUNCTION 01/05/2010  . SLEEP APNEA 04/06/2010  . PULMONARY FUNCTION TESTS, ABNORMAL 02/02/2010  . PERIPHERAL NEUROPATHY 01/05/2010  . OBESITY NOS 01/25/2007  . NEUROMA 01/05/2010  . INSOMNIA, HX OF 01/25/2007  . HYPERTENSION 01/25/2007  . FATIGUE, CHRONIC 11/11/2009  . ERECTILE DYSFUNCTION 01/25/2007  . DYSPNEA 12/19/2009  . DM 12/05/2009  . CHF 12/05/2009  . CHEST PAIN-UNSPECIFIED 11/11/2009  . BENIGN PROSTATIC HYPERTROPHY, HX OF 12/05/2009  . Atrial flutter 12/05/2009  . AORTIC STENOSIS, MODERATE 12/05/2009  . Knee pain, right 12/28/2010    ROS: [x]  Positive   [ ]  Negative   [ ]  All sytems reviewed and are negative  General:[ ]  Weight loss,  [ ]  Weight gain, [ ]  Fever, [ ]  chills Neurologic: [ ]  Dizziness, [ ]  Blackouts, [ ]  Seizure [ ]  Stroke, [ ]  "Mini stroke", [ ]  Slurred speech, [ ]  Temporary blindness;  [ ] weakness, [ ]  Hoarseness Cardiac: [ ]  Chest  pain/pressure, [ ]  Shortness of breath at rest x[ ]  Shortness of breath with exertion,  [x ]  Atrial fibrillation or irregular heartbeat Vascular:[ ]  Pain in legs with walking, [ ]  Pain in legs at rest ,[ ]  Pain in legs at night,  [ ]   Non-healing ulcer, [ ]  Blood clot in vein/DVT,   Pulmonary: [ ]  Home oxygen, [ ]   Productive cough, [ ]  Coughing up blood,  [ ]  Asthma,  [ ]  Wheezing Musculoskeletal:  x Arthritis, [ ]  Low back pain,  [x]  Joint pain right knee pain secondary to cartilage loss Hematologic:[ ]  Easy Bruising, [ ]  Anemia; [ ]  Hepatitis  Gastrointestinal: [ ]  Blood in stool,  [ ]  Gastroesophageal Reflux, [ ]  Trouble swallowing Urinary: [ ]  chronic Kidney disease, [ ]  on HD, [ ]  Burning with urination, [ ]  Frequent urination, [ ]  Difficulty urinating;  Skin: [ ]  Rashes, [ ]  Wounds    Social History History  Substance Use Topics  . Smoking status: Former Smoker -- 1.0 packs/day for 10 years    Quit date: 06/07/1980  . Smokeless tobacco: Never Used  . Alcohol Use: No    Family History Family History  Problem Relation Age of Onset  . Clotting disorder Mother   . Heart disease Mother     s/p MI  . Heart attack Mother   . Hypertension Mother   . Diabetes Mother   . Hyperlipidemia Mother   . Stroke Mother   .  Cancer Father     ? lung  . Lung disease Father     smoker  . Diabetes Sister   . Hypertension Sister     smoker  . Leukemia Maternal Grandmother     ?  [name] Aneurysm Sister     brain  . Other Sister     clipped  . Seizures Sister     d/o w/aneurysm/ smoker    Allergies  Allergen Reactions  . Sulfonamide Derivatives     REACTION: unknown reaction    Current outpatient prescriptions:albuterol-ipratropium (COMBIVENT) 18-103 MCG/ACT inhaler, Inhale 2 puffs into the lungs every 4 (four) hours as needed.  , Disp: , Rfl: ;  aspirin 81 MG tablet, Take 81 mg by mouth daily.  , Disp: , Rfl: ;  calcium carbonate (TUMS - DOSED IN MG ELEMENTAL CALCIUM) 500 MG chewable  tablet, Chew 1 tablet by mouth daily as needed.  , Disp: , Rfl:  colchicine 0.6 MG tablet, Take 0.6 mg by mouth daily. 1 tab po q2 hrs prn for pain to a max of 6 tabs/24 hr, pain relief or diarrhea , Disp: , Rfl: ;  diltiazem (CARDIZEM CD) 240 MG 24 hr capsule, Take 240 mg by mouth daily.  , Disp: , Rfl: ;  fish oil-omega-3 fatty acids 1000 MG capsule, Take 2 g by mouth daily.  , Disp: , Rfl:  furosemide (LASIX) 40 MG tablet, Take 40 mg by mouth as directed. 1/2 once daily and 1 extra if weight is over 196 , Disp: , Rfl: ;  gabapentin (NEURONTIN) 100 MG capsule, Take 100 mg by mouth as directed. 1 capsule po qhs and titrate up to 3 tabs qhs as tolerated daily , Disp: , Rfl: ;  glyBURIDE (DIABETA) 5 MG tablet, Take 1 tablet (5 mg total) by mouth 2 (two) times daily with a meal., Disp: 60 tablet, Rfl: 3 HYDROcodone-acetaminophen (NORCO) 5-325 MG per tablet, Take 1 tablet by mouth every 6 (six) hours as needed.  , Disp: , Rfl: ;  lisinopril (PRINIVIL,ZESTRIL) 20 MG tablet, Take 20 mg by mouth 2 (two) times daily.  , Disp: , Rfl: ;  metFORMIN (GLUCOPHAGE) 1000 MG tablet, Take 1 tablet (1,000 mg total) by mouth 2 (two) times daily., Disp: 30 tablet, Rfl: 3 metoprolol (TOPROL-XL) 50 MG 24 hr tablet, Take 1 tablet (50 mg total) by mouth daily., Disp: 90 tablet, Rfl: 1;  multivitamin (THERAGRAN) per tablet, Take 1 tablet by mouth daily.  , Disp: , Rfl: ;  naproxen (NAPROSYN) 375 MG tablet, Take 1 tablet (375 mg total) by mouth 2 (two) times daily with a meal., Disp: 60 tablet, Rfl: 1;  omeprazole (PRILOSEC) 40 MG capsule, Take 40 mg by mouth daily.  , Disp: , Rfl:  potassium chloride SA (K-DUR,KLOR-CON) 20 MEQ tablet, Take 20 mEq by mouth daily. 1/2 tab po , Disp: , Rfl: ;  rosuvastatin (CRESTOR) 20 MG tablet, Take 20 mg by mouth daily.  , Disp: , Rfl: ;  temazepam (RESTORIL) 15 MG capsule, 1-2 caps po qhs prn insomnia Use only when wearing CPAP, Disp: 60 capsule, Rfl: 2;  warfarin (COUMADIN) 4 MG tablet, Take 4 mg by  mouth daily.  , Disp: , Rfl:   Physical Examination  Filed Vitals:   02/15/11 1008  BP: 163/88  Pulse: 87  Resp: 20    Body mass index is 32.08 kg/(m^2).  General:  WDWN in NAD Gait: Normal HENT: WNL Eyes: Pupils equal Pulmonary: normal non-labored breathing , without  Rales, rhonchi,  wheezing Cardiac: RRR, without  Murmurs, rubs or gallops; No carotid bruits Abdomen: soft, NT, no masses Skin: no rashes, ulcers noted Vascular Exam/Pulses: 3+ femoral popliteal and dorsalis pedis pulses palpable bilaterally. There is a cluster of thrombosed varicosities in the left medial calf with resolving thrombophlebitis. Some varicosities are anterior and some are posterior. There is 1+ edema in the left leg. No hyperpigmentation or ulceration is noted. Extremities without ischemic changes, no Gangrene , no cellulitis; no open wounds;  Musculoskeletal: no muscle wasting or atrophy  Neurologic: A&O X 3; Appropriate Affect ; SENSATION: normal; MOTOR FUNCTION:  moving all extremities equally. Speech is fluent/normal  Non-Invasive Vascular Imaging: Gross reflux left great saphenous vein and left small saphenous vein with no DVT .  ASSESSMENT: Severe venous insufficiency left leg with gross reflux left great saphenous and small saphenous vein and history of recurrent thrombophlebitis left calf. Continued symptoms secondary to venous hypertension affecting his daily living. PLAN:   Review of Systems     Objective:   Physical Exam     Assessment:       Plan:     #1 on elastic compression stockings 20-30 mm gradient #2 elevation of legs as much as possible during the day #3 ibuprofen daily to decrease discomfort #4 return in 3 months. If no improvement bleed he should have laser ablation of left great saphenous spine to be followed by a laser ablation of left small saphenous vein. Will consider stab phlebectomy is depending on whether thrombophlebitis has resolved in calf varicosities.

## 2011-02-23 NOTE — Procedures (Unsigned)
LOWER EXTREMITY VENOUS REFLUX EXAM  INDICATION:  Left varicose veins.  EXAM:  Using color-flow imaging and pulse Doppler spectral analysis, the left common femoral, superficial femoral, popliteal, posterior tibial, greater and lesser saphenous veins are evaluated.  There is evidence suggesting deep venous insufficiency in the left lower extremity.  The left saphenofemoral junction is not competent with reflux of >520milliseconds. The left GSV is not competent with reflux of >523milliseconds with the caliber as described below.  The left proximal short saphenous vein demonstrates incompetency.  GSV Diameter (used if found to be incompetent only)                                           Right    Left Proximal Greater Saphenous Vein           cm       1.30 cm Proximal-to-mid-thigh                     cm       0.87 cm Mid thigh                                 cm       0.87 cm Mid-distal thigh                          cm       cm Distal thigh                              cm       0.90 cm Knee                                      cm       0.73 cm  IMPRESSION: 1. The left great saphenous vein is not competent with reflux     >533milliseconds. 2. The left great saphenous vein is slightly tortuous at the distal     thigh. 3. The deep venous system is not competent with Reflux of     >520milliseconds. 4. The left small saphenous vein is not competent with Reflux of     >568milliseconds.  ___________________________________________ Quita Skye Hart [place], M.D.  EM/MEDQ  D:  02/15/2011  T:  02/15/2011  Job:  [id number]

## 2011-03-01 ENCOUNTER — Ambulatory Visit: Payer: BC Managed Care – PPO | Admitting: Family Medicine

## 2011-03-03 ENCOUNTER — Ambulatory Visit (INDEPENDENT_AMBULATORY_CARE_PROVIDER_SITE_OTHER): Payer: BC Managed Care – PPO | Admitting: Family Medicine

## 2011-03-03 ENCOUNTER — Encounter: Payer: Self-pay | Admitting: Family Medicine

## 2011-03-03 VITALS — BP 168/100 | HR 100 | Temp 98.1°F | Ht 71.0 in | Wt 227.8 lb

## 2011-03-03 DIAGNOSIS — I4892 Unspecified atrial flutter: Secondary | ICD-10-CM

## 2011-03-03 DIAGNOSIS — R0609 Other forms of dyspnea: Secondary | ICD-10-CM

## 2011-03-03 DIAGNOSIS — M25569 Pain in unspecified knee: Secondary | ICD-10-CM

## 2011-03-03 DIAGNOSIS — E119 Type 2 diabetes mellitus without complications: Secondary | ICD-10-CM

## 2011-03-03 DIAGNOSIS — I10 Essential (primary) hypertension: Secondary | ICD-10-CM

## 2011-03-03 DIAGNOSIS — G4730 Sleep apnea, unspecified: Secondary | ICD-10-CM

## 2011-03-03 DIAGNOSIS — M25561 Pain in right knee: Secondary | ICD-10-CM

## 2011-03-03 DIAGNOSIS — R0600 Dyspnea, unspecified: Secondary | ICD-10-CM

## 2011-03-03 DIAGNOSIS — Z23 Encounter for immunization: Secondary | ICD-10-CM

## 2011-03-03 MED ORDER — IPRATROPIUM-ALBUTEROL 18-103 MCG/ACT IN AERO: INHALATION_SPRAY | RESPIRATORY_TRACT | Status: DC

## 2011-03-03 MED ORDER — IPRATROPIUM-ALBUTEROL 18-103 MCG/ACT IN AERO: 1.0000 | INHALATION_SPRAY | Freq: Four times a day (QID) | RESPIRATORY_TRACT | Status: DC | PRN

## 2011-03-03 NOTE — Patient Instructions (Signed)
Shortness of Breath (Dyspnea) Shortness of breath (dyspnea) is the feeling of uneasy breathing. Shortness of breath does not always mean that there is a life threatening illness. Dyspnea needs care right away. CAUSES The list of causes for shortness of breath is very long and includes:  Not enough oxygen in the air (as with high altitudes or with a smoke-filled room).   Acute lung disease.   Infections such as pneumonia.   Fluid in the lungs, such as heart failure.   Blood clot in the lungs (pulmonary embolism).   Lasting (chronic) lung diseases.   Heart Disease (heart attack, angina, heart failure, and others).   Low red blood cells (anemia).   Poor physical fitness can cause shortness of breath with exercise.   Chest or back injuries or stiffness.   Being overweight (obese) can make it hard to breath.   Anxiety can make you feel like you are not getting enough air even though everything is all right.  DIAGNOSIS Many tests may be done to find why you are having shortness of breath. Tests include:  Chest X-rays.   Lung function tests.   Blood tests.  Electrocardiogram.   Exercise testing.  A cardiac echo.   Scans.   Serious medical problems will usually be found during your exam. Your caregiver may not be able to find a cause for your shortness of breath after looking at you. In this case, it is important to have a follow-up and exam with your caregiver after a period of time.  HOME CARE INSTRUCTIONS  Do not smoke. Smoking is a common cause of shortness of breath. Ask for help to stop smoking.   Avoid being around chemicals that may bother your breathing (paint fumes, dust, etc.).   Rest as needed. Slowly begin your usual activities.   If medications were prescribed, take them as directed for the full length of time directed. This includes oxygen and any inhaled medications, if prescribed.   It is very important that you follow up with your caregiver or other  physician as directed. Waiting to do so or failure to follow-up could result in worsening of your condition and possible disability or death.   Be sure you understand what to do or who to call if your shortness of breath worsens.  SEEK MEDICAL CARE IF:  Your condition does not improve in the time expected.   You have a hard time doing your normal activities even with rest.   You have any side effects from or problems with medications prescribed.   You develop any new symptoms not discussed below.  SEEK IMMEDIATE MEDICAL CARE IF:  You feel your shortness of breath is getting worse.   You feel lightheaded, faint or develop a cough not controlled with medications.   You start coughing up blood.   You get pain with breathing.   You get chest pain or pain in your arms, shoulders or belly (abdomen).   You develop an oral temperature above 103, after not having a temperature for one or more days.   You are unable to walk up stairs or exercise the way you normally can.   If any of the symptoms, which brought you into the emergency room before, are getting worse and not better.  Document Released: 02/16/2001 Document Re-Released: 11/11/2009 [facility] Patient Information 2011 [place], [place].

## 2011-03-04 ENCOUNTER — Encounter: Payer: Self-pay | Admitting: Family Medicine

## 2011-03-04 NOTE — Assessment & Plan Note (Signed)
Patient is elevated but has not taken everything this am, he is scheduled to see cardiology soon. He is to call if his pressure continues to run hi and have another evaluation

## 2011-03-04 NOTE — Progress Notes (Signed)
[name] [id number] 09/19/1950 03/04/2011      Progress Note-Follow Up  Subjective  Chief Complaint  Chief Complaint  Patient presents with  . Follow-up    2 month follow up    HPI  Patient is a 60 year old [place] American male in today for a second disability forms. He's been on Social Security disability since November 2011 and brings a paper stating per minute. He has a myriad of health problems including chronic fatigue, testicular hypofunction, atrial fibrillation, congestive heart failure, diabetes, hypertension, sleep apnea. This list is nonocclusive. He also has chronic right knee pain with moderate to severe arthritis. Today he is offering no acute complaints but does continue to struggle with fatigue and dyspnea. The right knee does limit his ability to stay active. He's had no significant flares in his blood sugars recently. No recent febrile illness, chest pain, palpitations, GI or GU complaints. He was last hospitalized in April 2012 with uncontrolled AFib.  He notes his dyspnea occurs both day and night. It occurs both at rest and with exertion. He is not presently using any Combivent or albuterol.  Past Medical History  Diagnosis Date  . Hypertension   . Hyperlipidemia   . Atrial fibrillation   . UNSPECIFIED ANEMIA 01/05/2010  . TESTICULAR HYPOFUNCTION 01/05/2010  . SLEEP APNEA 04/06/2010  . PULMONARY FUNCTION TESTS, ABNORMAL 02/02/2010  . PERIPHERAL NEUROPATHY 01/05/2010  . OBESITY NOS 01/25/2007  . NEUROMA 01/05/2010  . INSOMNIA, HX OF 01/25/2007  . HYPERTENSION 01/25/2007  . FATIGUE, CHRONIC 11/11/2009  . ERECTILE DYSFUNCTION 01/25/2007  . DYSPNEA 12/19/2009  . DM 12/05/2009  . CHF 12/05/2009  . CHEST PAIN-UNSPECIFIED 11/11/2009  . BENIGN PROSTATIC HYPERTROPHY, HX OF 12/05/2009  . Atrial flutter 12/05/2009  . AORTIC STENOSIS, MODERATE 12/05/2009  . Knee pain, right 12/28/2010    Past Surgical History  Procedure Date  . Bilat foot surgery removed part of 5th metatarsal to  corect curvature of toes     Family History  Problem Relation Age of Onset  . Clotting disorder Mother   . Heart disease Mother     s/p MI  . Heart attack Mother   . Hypertension Mother   . Diabetes Mother   . Hyperlipidemia Mother   . Stroke Mother   . Cancer Father     ? lung  . Lung disease Father     smoker  . Diabetes Sister   . Hypertension Sister     smoker  . Leukemia Maternal Grandmother     ?  [name] Aneurysm Sister     brain  . Other Sister     clipped  . Seizures Sister     d/o w/aneurysm/ smoker    History   Social History  . Marital Status: Married    Spouse Name: N/A    Number of Children: N/A  . Years of Education: N/A   Occupational History  . Not on file.   Social History Main Topics  . Smoking status: Former Smoker -- 1.0 packs/day for 10 years    Quit date: 06/07/1980  . Smokeless tobacco: Never Used  . Alcohol Use: No  . Drug Use: No  . Sexually Active: Not on file   Other Topics Concern  . Not on file   Social History Narrative  . No narrative on file    Current Outpatient Prescriptions on File Prior to Visit  Medication Sig Dispense Refill  . aspirin 81 MG tablet Take 81 mg by mouth daily.        [name]  calcium carbonate (TUMS - DOSED IN MG ELEMENTAL CALCIUM) 500 MG chewable tablet Chew 1 tablet by mouth daily as needed.        . colchicine 0.6 MG tablet Take 0.6 mg by mouth daily. 1 tab po q2 hrs prn for pain to a max of 6 tabs/24 hr, pain relief or diarrhea       . diltiazem (CARDIZEM CD) 240 MG 24 hr capsule Take 240 mg by mouth daily.        . fish oil-omega-3 fatty acids 1000 MG capsule Take 2 g by mouth daily.        . furosemide (LASIX) 40 MG tablet Take 40 mg by mouth as directed. 1/2 once daily and 1 extra if weight is over 196       . gabapentin (NEURONTIN) 100 MG capsule Take 100 mg by mouth as directed. 1 capsule po qhs and titrate up to 3 tabs qhs as tolerated daily       . glyBURIDE (DIABETA) 5 MG tablet Take 1 tablet (5 mg  total) by mouth 2 (two) times daily with a meal.  60 tablet  3  . HYDROcodone-acetaminophen (NORCO) 5-325 MG per tablet Take 1 tablet by mouth every 6 (six) hours as needed.        [name] lisinopril (PRINIVIL,ZESTRIL) 20 MG tablet Take 20 mg by mouth 2 (two) times daily.        . metFORMIN (GLUCOPHAGE) 1000 MG tablet Take 1 tablet (1,000 mg total) by mouth 2 (two) times daily.  30 tablet  3  . metoprolol (TOPROL-XL) 50 MG 24 hr tablet Take 1 tablet (50 mg total) by mouth daily.  90 tablet  1  . multivitamin (THERAGRAN) per tablet Take 1 tablet by mouth daily.        . naproxen (NAPROSYN) 375 MG tablet Take 1 tablet (375 mg total) by mouth 2 (two) times daily with a meal.  60 tablet  1  . omeprazole (PRILOSEC) 40 MG capsule Take 40 mg by mouth daily.        . potassium chloride SA (K-DUR,KLOR-CON) 20 MEQ tablet Take 20 mEq by mouth daily. 1/2 tab po       . temazepam (RESTORIL) 15 MG capsule 1-2 caps po qhs prn insomnia Use only when wearing CPAP  60 capsule  2  . warfarin (COUMADIN) 4 MG tablet Take 4 mg by mouth daily.          Allergies  Allergen Reactions  . Sulfonamide Derivatives     REACTION: unknown reaction    Review of Systems  Review of Systems  Constitutional: Negative for fever and malaise/fatigue.  HENT: Negative for congestion and sore throat.   Eyes: Negative for discharge.  Respiratory: Positive for shortness of breath. Negative for cough and wheezing.   Cardiovascular: Negative for chest pain, palpitations and leg swelling.  Gastrointestinal: Negative for nausea, abdominal pain and diarrhea.  Genitourinary: Negative for dysuria.  Musculoskeletal: Negative for falls.  Skin: Negative for rash.  Neurological: Negative for loss of consciousness and headaches.  Endo/Heme/Allergies: Negative for polydipsia.  Psychiatric/Behavioral: Negative for depression and suicidal ideas. The patient is not nervous/anxious and does not have insomnia.     Objective  BP 168/100  Pulse  100  Temp(Src) 98.1 F (36.7 C) (Oral)  Ht 5\' 11"  (1.803 m)  Wt 227 lb 12.8 oz (103.329 kg)  BMI 31.77 kg/m2  SpO2 99%  Physical Exam  Physical Exam  Constitutional: He is oriented to  person, place, and time and well-developed, well-nourished, and in no distress. No distress.  HENT:  Head: Normocephalic and atraumatic.  Eyes: Conjunctivae are normal.  Neck: Neck supple. No thyromegaly present.  Cardiovascular: Regular rhythm.   Murmur heard.      2/6 murmur  Pulmonary/Chest: Effort normal and breath sounds normal. No respiratory distress.  Abdominal: He exhibits no distension and no mass. There is no tenderness.  Musculoskeletal: He exhibits no edema.  Neurological: He is alert and oriented to person, place, and time.  Skin: Skin is warm.  Psychiatric: Memory, affect and judgment normal.    Lab Results  Component Value Date   TSH 0.79 12/14/2010   Lab Results  Component Value Date   WBC 5.1 12/14/2010   HGB 12.3* 12/14/2010   HCT 36.6* 12/14/2010   MCV 79.5 12/14/2010   PLT 215.0 12/14/2010   Lab Results  Component Value Date   CREATININE 1.0 12/21/2010   BUN 19 12/21/2010   NA 135 12/21/2010   K 4.3 12/21/2010   CL 103 12/21/2010   CO2 24 12/21/2010   Lab Results  Component Value Date   ALT 32 12/14/2010   AST 25 12/14/2010   ALKPHOS 33* 12/14/2010   BILITOT 0.5 12/14/2010   Lab Results  Component Value Date   CHOL 94 12/21/2010   Lab Results  Component Value Date   HDL 36.00* 12/21/2010   Lab Results  Component Value Date   LDLCALC 38 12/21/2010   Lab Results  Component Value Date   TRIG 100.0 12/21/2010   Lab Results  Component Value Date   CHOLHDL 3 12/21/2010     Assessment & Plan  HYPERTENSION Patient is elevated but has not taken everything this am, he is scheduled to see cardiology soon. He is to call if his pressure continues to run hi and have another evaluation  SLEEP APNEA Using his CPAP but still struggling with dyspnea both at night and during the  day, both at rest and with exertion. He is not using his Combivent, he is given a refill today and asked to use it 1 puff twice daily and to titrate up to 2 puffs po tid  Until symptom relief, if no relief needs to follow up with pulmonology, cardiology and/or our office.  Knee pain, right Brings in papers and xrays from ortho confirming mod to severe OA in this knee, he is encouraged to stay active, but not to overdo. He has disability forms to complete today and has been out on Social Security Disability since last November 2011. These forms are completed today.  DM No recent flare in numbers, labs are due next month, will order them for mid October  ATRIAL FLUTTER Last episode of uncontrolled rate was in April of 2012, he will continue to monitor and follow up with cardiology

## 2011-03-04 NOTE — Assessment & Plan Note (Signed)
Last episode of uncontrolled rate was in April of 2012, he will continue to monitor and follow up with cardiology

## 2011-03-04 NOTE — Assessment & Plan Note (Signed)
No recent flare in numbers, labs are due next month, will order them for mid October

## 2011-03-04 NOTE — Assessment & Plan Note (Signed)
Using his CPAP but still struggling with dyspnea both at night and during the day, both at rest and with exertion. He is not using his Combivent, he is given a refill today and asked to use it 1 puff twice daily and to titrate up to 2 puffs po tid  Until symptom relief, if no relief needs to follow up with pulmonology, cardiology and/or our office.

## 2011-03-04 NOTE — Assessment & Plan Note (Addendum)
Brings in papers and xrays from ortho confirming mod to severe OA in this knee, he is encouraged to stay active, but not to overdo. He has disability forms to complete today and has been out on Social Security Disability since last November 2011. These forms are completed today.

## 2011-03-09 ENCOUNTER — Encounter: Payer: Self-pay | Admitting: Family Medicine

## 2011-03-24 ENCOUNTER — Other Ambulatory Visit (INDEPENDENT_AMBULATORY_CARE_PROVIDER_SITE_OTHER): Payer: BC Managed Care – PPO

## 2011-03-24 DIAGNOSIS — E119 Type 2 diabetes mellitus without complications: Secondary | ICD-10-CM

## 2011-03-24 DIAGNOSIS — E782 Mixed hyperlipidemia: Secondary | ICD-10-CM

## 2011-03-24 DIAGNOSIS — I10 Essential (primary) hypertension: Secondary | ICD-10-CM

## 2011-03-24 DIAGNOSIS — E039 Hypothyroidism, unspecified: Secondary | ICD-10-CM

## 2011-03-24 DIAGNOSIS — I83893 Varicose veins of bilateral lower extremities with other complications: Secondary | ICD-10-CM

## 2011-03-24 LAB — RENAL FUNCTION PANEL
Albumin: 3.7 g/dL (ref 3.5–5.2)
CO2: 24 mEq/L (ref 19–32)
Calcium: 9.3 mg/dL (ref 8.4–10.5)
Creatinine, Ser: 1.2 mg/dL (ref 0.4–1.5)
Sodium: 139 mEq/L (ref 135–145)

## 2011-03-24 LAB — HEPATIC FUNCTION PANEL
AST: 20 U/L (ref 0–37)
Alkaline Phosphatase: 40 U/L (ref 39–117)
Bilirubin, Direct: 0.2 mg/dL (ref 0.0–0.3)
Total Bilirubin: 1.2 mg/dL (ref 0.3–1.2)

## 2011-03-24 LAB — T4, FREE: Free T4: 0.87 ng/dL (ref 0.60–1.60)

## 2011-03-24 LAB — CBC WITH DIFFERENTIAL/PLATELET
Basophils Relative: 0.2 % (ref 0.0–3.0)
Eosinophils Relative: 0.1 % (ref 0.0–5.0)
HCT: 37.4 % — ABNORMAL LOW (ref 39.0–52.0)
Lymphs Abs: 1.8 10*3/uL (ref 0.7–4.0)
MCV: 81.3 fl (ref 78.0–100.0)
Monocytes Absolute: 0.9 10*3/uL (ref 0.1–1.0)
Platelets: 197 10*3/uL (ref 150.0–400.0)
WBC: 7.9 10*3/uL (ref 4.5–10.5)

## 2011-03-24 LAB — HEMOGLOBIN A1C: Hgb A1c MFr Bld: 8.3 % — ABNORMAL HIGH (ref 4.6–6.5)

## 2011-03-24 LAB — LIPID PANEL: Total CHOL/HDL Ratio: 3

## 2011-03-28 LAB — NMR LIPOPROFILE WITHOUT LIPIDS
LDL Size: 20.4 nm — ABNORMAL LOW (ref >20.5)
Small LDL Particle Number: 591 nmol/L — ABNORMAL HIGH (ref <=527)

## 2011-04-08 ENCOUNTER — Ambulatory Visit (HOSPITAL_COMMUNITY)
Admission: RE | Admit: 2011-04-08 | Discharge: 2011-04-08 | Disposition: A | Discharge status: Home / Self Care | Payer: BC Managed Care – PPO | Source: Ambulatory Visit | Attending: Cardiovascular Disease | Admitting: Cardiovascular Disease

## 2011-04-08 ENCOUNTER — Other Ambulatory Visit: Payer: Self-pay

## 2011-04-08 DIAGNOSIS — I5030 Unspecified diastolic (congestive) heart failure: Secondary | ICD-10-CM | POA: Insufficient documentation

## 2011-04-08 DIAGNOSIS — I4892 Unspecified atrial flutter: Secondary | ICD-10-CM | POA: Insufficient documentation

## 2011-04-08 DIAGNOSIS — Z7901 Long term (current) use of anticoagulants: Secondary | ICD-10-CM | POA: Insufficient documentation

## 2011-04-08 DIAGNOSIS — E119 Type 2 diabetes mellitus without complications: Secondary | ICD-10-CM | POA: Insufficient documentation

## 2011-04-08 DIAGNOSIS — I2789 Other specified pulmonary heart diseases: Secondary | ICD-10-CM | POA: Insufficient documentation

## 2011-04-08 DIAGNOSIS — I10 Essential (primary) hypertension: Secondary | ICD-10-CM | POA: Insufficient documentation

## 2011-04-08 DIAGNOSIS — G4733 Obstructive sleep apnea (adult) (pediatric): Secondary | ICD-10-CM | POA: Insufficient documentation

## 2011-04-08 LAB — COMPREHENSIVE METABOLIC PANEL
Albumin: 3.7 g/dL (ref 3.5–5.2)
BUN: 16 mg/dL (ref 6–23)
Creatinine, Ser: 1.04 mg/dL (ref 0.50–1.35)
GFR calc Af Amer: 90 mL/min — ABNORMAL LOW (ref >90)
Glucose, Bld: 141 mg/dL — ABNORMAL HIGH (ref 70–99)
Total Bilirubin: 0.6 mg/dL (ref 0.3–1.2)
Total Protein: 6.9 g/dL (ref 6.0–8.3)

## 2011-04-08 LAB — APTT: aPTT: 37 seconds (ref 24–37)

## 2011-04-08 LAB — CBC
Platelets: 242 10*3/uL (ref 150–400)
RDW: 15.1 % (ref 11.5–15.5)
WBC: 4 10*3/uL (ref 4.0–10.5)

## 2011-04-08 LAB — PROTIME-INR: INR: 1.91 — ABNORMAL HIGH (ref 0.00–1.49)

## 2011-04-12 ENCOUNTER — Encounter: Payer: Self-pay | Admitting: Adult Health

## 2011-04-12 ENCOUNTER — Ambulatory Visit (INDEPENDENT_AMBULATORY_CARE_PROVIDER_SITE_OTHER): Payer: BC Managed Care – PPO | Admitting: Adult Health

## 2011-04-12 ENCOUNTER — Ambulatory Visit (INDEPENDENT_AMBULATORY_CARE_PROVIDER_SITE_OTHER)
Admission: RE | Admit: 2011-04-12 | Discharge: 2011-04-12 | Disposition: A | Discharge status: Home / Self Care | Payer: BC Managed Care – PPO | Source: Ambulatory Visit | Attending: Adult Health | Admitting: Adult Health

## 2011-04-12 VITALS — BP 118/76 | HR 94 | Temp 97.5°F | Ht 70.0 in | Wt 225.8 lb

## 2011-04-12 DIAGNOSIS — R0602 Shortness of breath: Secondary | ICD-10-CM

## 2011-04-12 NOTE — Progress Notes (Signed)
Subjective:    Patient ID: [name], male    DOB: [birth year], 60 y.o.   MRN: [id number]  HPI 66 AAM with known hx of HTN , OSA , Hyperlipidemia, and Nonischemic Cardiomyopathy, Afib   OV 11/11/2009: 60 year old male very limited smoker in past. Admitted in May 2011 for RML pneumona but also simultatneosuly had problems with NICM - ef 35%, and A Fib RVR needing cardioversion and amiodarone (See past medhx). After discharte also had readmission for C Diff. HE is following at office today for pneumonia. Since discharge does not feel back to baseline. Still very dyspneic with minimal exertion. Also having Rt lateral chest pain that is constant and is moderate in severity and worsened with raising arm. No improvement in pain. Has associated fatigue and nausea and vomit. Has see Dr. [name] last week; repeat echo is pending. REC: FU in 1 month with CXR. Dr. Sabino Niemann to address nausea by stopping amiodaorne   OV 12/19/2009: Followup dyspnea/fatigue, pneumonia and rt chest pain. Since lsat visit rt chest pain has resolved. Nausea resolved after stopping amiodarone. However, he still has exertional fatigue and dyspnea for activities such as walking to yard and back or moving chairs across rooms. Dyspnea relieved by rest but fatigue less so. He now states that dyspnea has actually been present for years and recollects DR. [name] (former PMD) doing CT chest in 2007. I reviewd this and it shows clear lung fields. WE walked him today and he does not desaturate. He wonders if dyspnea is due to his medicaitons. He is on an ACE inhibitor but denies cough, wheezing, sputum. Of note, he is off amidarone. REC: FULL PFT -> 01/21/2010 - TLC 77%, DLCO 56%, FVC 3.5L/72%, RAtio 77. -> GET CT CHEST   March 30, 2010: Followup dyspnea. had CT chest 01/21/2010: Shows clearance of RML pneumonia and effusion. No infitlrates or ILD. However, still dyspneic This has not changed since lsat visit. Exertional. RElieved by rest.  Somedays worse. Somedays better but always there everyday with exertion. Denies associated wheeze, cough, pnd, symcope, edema. Occ associaed sneezing +. No fever NOTEL He has gained weight but unclear how much. Not had flu shot yet. Wil have it toda  04/12/2011 Acute OV  Pt returns today for persistent dyspnea with minimal activity. Says that he is no better with his dyspnea. Still wears out easily. Did have recent cardioversion on 04/08/11 for Atrial Flutter.  Has had just slightly less less dyspnea since cardioversion.  He denies a cough or increased edema. (of note on ACE INhibitor ). He is on coumadin daily . No hemoptysis or chest pain.  Today in the office he does desat with walking to 86% . He does wear his CPAP each night -assures me he is complaint.  Previous PFT 2011 with preserved lung fxn w/ FEV1 at 78%, ratio of 77 , DLCo was reduced at 56%.        PMH:  EMG/NCS 01/17/2006 .Marland KitchenMarland KitchenDr Anne Hahn  > Suuggestive of diabetic neuropathy  #Cough April 2007 REolsved  - CT chest 09/20/2005 - No hilar or lung mass (prominent hilum on CXR was due to pulmonary vessels)  >CT chest 10/11 >>Resolved right middle lobe pneumonia with mild residual postinflammatory scarring or atelectasis.  No consolidation or adenopathy. #Dyspnea >>2011 FEV1 78%, ratio 77 and DLCO 56% > Atrial fibrillation with rapid ventricular response,   Cardioversion 06/2010 , 04/08/2011   >Type 2 diabetes.   >Hypertension.   >Nonischemic cardiomyopathy.   Review  of Systems Constitutional:   No  weight loss, night sweats,  Fevers, chills, + fatigue, or  lassitude.  HEENT:   No headaches,  Difficulty swallowing,  Tooth/dental problems, or  Sore throat,                No sneezing, itching, ear ache, nasal congestion, post nasal drip,   CV:  No chest pain,  Orthopnea, PND, swelling in lower extremities, anasarca, dizziness, palpitations, syncope.   GI  No heartburn, indigestion, abdominal pain, nausea, vomiting, diarrhea, change in  bowel habits, loss of appetite, bloody stools.   Resp:   No coughing up of blood.    No chest wall deformity  Skin: no rash or lesions.  GU: no dysuria, change in color of urine, no urgency or frequency.  No flank pain, no hematuria   MS:  No joint pain or swelling.  No decreased range of motion.  No back pain.  Psych:  No change in mood or affect. No depression or anxiety.  No memory loss.          Objective:   Physical Exam GEN: A/Ox3; pleasant , obese   HEENT:  [place]/AT,  EACs-clear, TMs-wnl, NOSE-clear, THROAT-clear, no lesions, no postnasal drip or exudate noted.   NECK:  Supple w/ fair ROM; no JVD; normal carotid impulses w/o bruits; no thyromegaly or nodules palpated; no lymphadenopathy.  RESP  Coarse BS w/ diminished BS in bases, no accessory muscle use, no dullness to percussion  CARD:  RRR, 1/6 SM , tr  peripheral edema, pulses intact, no cyanosis or clubbing.  GI:   Soft & nt; nml bowel sounds; no organomegaly or masses detected.  Musco: Warm bil, no deformities or joint swelling noted.   Neuro: alert, no focal deficits noted.    Skin: Warm, no lesions or rashes         Assessment & Plan:

## 2011-04-12 NOTE — Patient Instructions (Addendum)
I will call with xray results.  We are starting Oxygen 2 l/m with activity.  We will set you up for an overnight oximetry with your CPAP to evaluate  follow up Dr. Vassie Loll  In 4 weeks and As needed   Please contact office for sooner follow up if symptoms do not improve or worsen or seek emergency care

## 2011-04-13 ENCOUNTER — Telehealth: Payer: Self-pay | Admitting: Pulmonary Disease

## 2011-04-13 NOTE — Telephone Encounter (Signed)
I spoke with pt and advised him of cxr results. Pt verbalized understanding and had no questions

## 2011-04-13 NOTE — Telephone Encounter (Signed)
PATIENT CALLED AGAIN AND WANTS RESULTS OF X-RAY.  PLEASE CALL BACK AT [phone]

## 2011-04-14 NOTE — Assessment & Plan Note (Addendum)
Dyspnea ? etilogy -suspect is multifactoral with underlying NICM w/ EF of 35%, OSA, obesity and now with documented activity desaturations   Plan:  Check Xray today  Set up for ONO  Begin O2 at 2 l/m with acitiivty .  follow up Dr. Vassie Loll  In 4 weeks and As needed

## 2011-04-19 ENCOUNTER — Other Ambulatory Visit: Payer: Self-pay | Admitting: Family Medicine

## 2011-04-22 NOTE — Op Note (Signed)
  NAMELUISMARIO, COSTON NO.:  [id number]  MEDICAL RECORD NO.:  [id number]  LOCATION:  [facility]                         FACILITY:  [facility]  PHYSICIAN:  Thurmon Fair, MD     DATE OF BIRTH:  05-17-1951  DATE OF PROCEDURE:  04/08/2011 DATE OF DISCHARGE:  04/08/2011                              OPERATIVE REPORT   PROCEDURE PERFORMED:  Direct current cardioversion.  REASON FOR PROCEDURE:  Atrial flutter with rapid ventricular response.  ANESTHESIA:  Sedation was administered by the Anesthesiology Service, Dr. Jean Rosenthal.  Propofol 120 mg was administered intravenously.  INDICATIONS:  Mr. Harshberger is a 60 year old gentleman with diastolic heart failure, obstructive sleep apnea, malignant systemic hypertension, and pulmonary hypertension who presents with atrial flutter with rapid ventricular response.  He is therapeutically anticoagulated with warfarin.  A few days ago, his INR was actually supratherapeutic at greater than 4.  His Coumadin dose was decreased and today his INR is marginally subtherapeutic around 1.9. Lovenox has been administered while his INR is less than 2.0.  After risks and benefits of the procedure were described, the patient provided informed consent.  He was in the fasting state.  After adequate sedation had been achieved, an initial synchronized biphasic cardioversion shock at 120 joules was administered, but the patient's rhythm remained atrial flutter with rapid ventricular response.  Additional sedation was administered and a second 200-joule cardioversion shock was administered in a synchronized fashion.  The rhythm converted to sinus rhythm at about 95 beats per minute.  No immediate complications occurred.  Mr. Fyock will return to our office in the morning to have another INR check to see if a bridging Lovenox is still necessary.     Thurmon Fair, MD    MC/MED[facility]  D:  04/08/2011  T:  04/08/2011  Job:  [id number]  cc:    [facility]  Electronically Signed by Thurmon Fair M.D. on 04/22/2011 04:42:22 PM

## 2011-04-23 ENCOUNTER — Encounter: Payer: Self-pay | Admitting: Adult Health

## 2011-04-23 ENCOUNTER — Ambulatory Visit (INDEPENDENT_AMBULATORY_CARE_PROVIDER_SITE_OTHER): Payer: BC Managed Care – PPO | Admitting: Internal Medicine

## 2011-04-23 ENCOUNTER — Encounter: Payer: Self-pay | Admitting: Internal Medicine

## 2011-04-23 DIAGNOSIS — I4892 Unspecified atrial flutter: Secondary | ICD-10-CM

## 2011-04-23 DIAGNOSIS — I509 Heart failure, unspecified: Secondary | ICD-10-CM

## 2011-04-23 NOTE — Progress Notes (Signed)
HPI [name] is referred today by Dr. Royann Shivers for evaluation of atrial flutter. The patient is a very pleasant 60 year old man with a tachycardia induced cardiomyopathy. With control of his ventricular rate, his ejection fraction is near normal. On multiple occasions, he has had severe left ventricular dysfunction secondary to uncontrolled atrial fibrillation and flutter. The patient appears to have both arrhythmias. I have had a chance to review multiple electrograms from the patient's chart and find both atrial fib and flutter. He has not had syncope. He denies chest pain. When he is in sinus rhythm, his activity is basically unlimited in his heart failure is class I. When he is out of rhythm his heart failure is much worse and has had multiple hospitalizations for decompensation. He has undergone cardioversion on several occasions. The patient has a history of sleep apnea and is on CPAP. Previously, he was unable to tolerate amiodarone as well as Rythmol.  Allergies  Allergen Reactions  . Sulfonamide Derivatives     REACTION: unknown reaction     Current Outpatient Prescriptions  Medication Sig Dispense Refill  . albuterol-ipratropium (COMBIVENT) 18-103 MCG/ACT inhaler 1 puff po twice daily and then can increase to 2 puffs po three times daily prn  1 Inhaler  3  . aspirin 81 MG tablet Take 81 mg by mouth daily.        . calcium carbonate (TUMS - DOSED IN MG ELEMENTAL CALCIUM) 500 MG chewable tablet Chew 1 tablet by mouth daily as needed.        . diltiazem (CARDIZEM CD) 240 MG 24 hr capsule Take 240 mg by mouth daily.        . fish oil-omega-3 fatty acids 1000 MG capsule Take 2 g by mouth daily.        . furosemide (LASIX) 40 MG tablet Take 40 mg by mouth as directed. 1/2 once daily and 1 extra if weight is over 196       . glyBURIDE (DIABETA) 5 MG tablet Take 1 tablet (5 mg total) by mouth 2 (two) times daily with a meal.  60 tablet  3  . HYDROcodone-acetaminophen (NORCO) 5-325 MG per  tablet Take 1 tablet by mouth every 6 (six) hours as needed.        [name] lisinopril (PRINIVIL,ZESTRIL) 20 MG tablet Take 20 mg by mouth 2 (two) times daily.        . metFORMIN (GLUCOPHAGE) 1000 MG tablet TAKE ONE TABLET BY MOUTH TWICE DAILY  60 tablet  2  . metoprolol (TOPROL-XL) 50 MG 24 hr tablet Take 50 mg by mouth 2 (two) times daily.        . multivitamin (THERAGRAN) per tablet Take 1 tablet by mouth daily.        . potassium chloride SA (K-DUR,KLOR-CON) 20 MEQ tablet Take 20 mEq by mouth daily. 1/2 tab po       . pravastatin (PRAVACHOL) 40 MG tablet Take 40 mg by mouth daily.        . propafenone (RYTHMOL) 225 MG tablet Take 225 mg by mouth 2 (two) times daily.        . temazepam (RESTORIL) 15 MG capsule        . Testosterone 1.25 GM/ACT (1%) GEL Place 1 strip onto the skin daily.        [name] warfarin (COUMADIN) 4 MG tablet Take 4 mg by mouth daily.           Past Medical History  Diagnosis Date  .  Hypertension   . Hyperlipidemia   . Atrial fibrillation   . UNSPECIFIED ANEMIA 01/05/2010  . TESTICULAR HYPOFUNCTION 01/05/2010  . SLEEP APNEA 04/06/2010  . PULMONARY FUNCTION TESTS, ABNORMAL 02/02/2010  . PERIPHERAL NEUROPATHY 01/05/2010  . OBESITY NOS 01/25/2007  . NEUROMA 01/05/2010  . INSOMNIA, HX OF 01/25/2007  . HYPERTENSION 01/25/2007  . FATIGUE, CHRONIC 11/11/2009  . ERECTILE DYSFUNCTION 01/25/2007  . DYSPNEA 12/19/2009  . DM 12/05/2009  . CHF 12/05/2009  . CHEST PAIN-UNSPECIFIED 11/11/2009  . BENIGN PROSTATIC HYPERTROPHY, HX OF 12/05/2009  . Atrial flutter 12/05/2009  . AORTIC STENOSIS, MODERATE 12/05/2009  . Knee pain, right 12/28/2010    ROS:   All systems reviewed and negative except as noted in the HPI.   Past Surgical History  Procedure Date  . Bilat foot surgery removed part of 5th metatarsal to corect curvature of toes      Family History  Problem Relation Age of Onset  . Clotting disorder Mother   . Heart disease Mother     s/p MI  . Heart attack Mother   . Hypertension Mother    . Diabetes Mother   . Hyperlipidemia Mother   . Stroke Mother   . Cancer Father     ? lung  . Lung disease Father     smoker  . Diabetes Sister   . Hypertension Sister     smoker  . Leukemia Maternal Grandmother     ?  [name] Aneurysm Sister     brain  . Other Sister     clipped  . Seizures Sister     d/o w/aneurysm/ smoker     History   Social History  . Marital Status: Married    Spouse Name: N/A    Number of Children: N/A  . Years of Education: N/A   Occupational History  . Not on file.   Social History Main Topics  . Smoking status: Former Smoker -- 1.0 packs/day for 10 years    Quit date: 06/07/1980  . Smokeless tobacco: Never Used  . Alcohol Use: No  . Drug Use: No  . Sexually Active: Not on file   Other Topics Concern  . Not on file   Social History Narrative  . No narrative on file     BP 118/72  Pulse 81  Ht 5\' 11"  (1.803 m)  Wt 103.148 kg (227 lb 6.4 oz)  BMI 31.72 kg/m2  Physical Exam:  Well appearing middle-age man, NAD HEENT: Unremarkable Neck:  No JVD, no thyromegally Lymphatics:  No adenopathy Back:  No CVA tenderness Lungs:  Clear with no wheezes, rales, or rhonchi. HEART:  Regular rate rhythm, no murmurs, no rubs, no clicks Abd:  Soft, obese, positive bowel sounds, no organomegally, no rebound, no guarding Ext:  2 plus pulses, no edema, no cyanosis, no clubbing Skin:  No rashes no nodules Neuro:  CN II through XII intact, motor grossly intact  EKG Normal sinus rhythm.  Assess/Plan:

## 2011-04-23 NOTE — Patient Instructions (Signed)
Dr Ladona Ridgel needs to talk with Dr C regarding his flutter and we will call the patient regarding his follow up

## 2011-04-23 NOTE — Assessment & Plan Note (Signed)
The patient has clear evidence of atrial fibrillation and atrial flutter. I actually do not have an EKG of atrial flutter but do have EKGs of atrial fibrillation. I discussed the treatment options with the patient. Catheter ablation of both atrial flutter ablation and flutter would be a consideration. However the patient would also be a good candidate for dofetilide. He is unsure as to whether he would like to be hospitalized for 3-1/2 days for this treatment. Because this treatment has the potential to take care of both arrhythmias, I have encouraged him in this approach rather than catheter ablation. His underlying structural heart disease and his propensity for sleep apnea despite CPAP reduce the likelihood of a long-term cure her with catheter ablation. I plan to discuss all these issues with his primary cardiologist and we will come up with a plan that makes the most sense.

## 2011-04-23 NOTE — Assessment & Plan Note (Signed)
When the patient is in sinus rhythm his heart failure is class 1-2. He will continue his current medical therapy and maintain a low-sodium diet.

## 2011-04-28 ENCOUNTER — Encounter: Payer: Self-pay | Admitting: Adult Health

## 2011-05-03 ENCOUNTER — Ambulatory Visit: Payer: BC Managed Care – PPO | Admitting: Family Medicine

## 2011-05-05 ENCOUNTER — Ambulatory Visit (INDEPENDENT_AMBULATORY_CARE_PROVIDER_SITE_OTHER): Payer: BC Managed Care – PPO | Admitting: Family Medicine

## 2011-05-05 ENCOUNTER — Ambulatory Visit: Payer: BC Managed Care – PPO | Admitting: Family Medicine

## 2011-05-05 ENCOUNTER — Encounter: Payer: Self-pay | Admitting: Family Medicine

## 2011-05-05 DIAGNOSIS — E119 Type 2 diabetes mellitus without complications: Secondary | ICD-10-CM

## 2011-05-05 DIAGNOSIS — E785 Hyperlipidemia, unspecified: Secondary | ICD-10-CM

## 2011-05-05 DIAGNOSIS — R0902 Hypoxemia: Secondary | ICD-10-CM | POA: Insufficient documentation

## 2011-05-05 DIAGNOSIS — G4700 Insomnia, unspecified: Secondary | ICD-10-CM

## 2011-05-05 DIAGNOSIS — I10 Essential (primary) hypertension: Secondary | ICD-10-CM

## 2011-05-05 DIAGNOSIS — D649 Anemia, unspecified: Secondary | ICD-10-CM

## 2011-05-05 HISTORY — DX: Hypoxemia: R09.02

## 2011-05-05 MED ORDER — TEMAZEPAM 15 MG PO CAPS: 15.0000 mg | ORAL_CAPSULE | Freq: Every evening | ORAL | Status: DC | PRN

## 2011-05-05 NOTE — Assessment & Plan Note (Signed)
hgba1c 8.3 in October but is reporting sugars as low as 69 typical

## 2011-05-05 NOTE — Assessment & Plan Note (Addendum)
Does not need O2 qhs according to a nocturnal oxygen. Has to carry O2 with him now and gets easily winded paper work is completed today for handicap placard

## 2011-05-05 NOTE — Progress Notes (Signed)
[name] [id number] [birth year] 05/05/2011      Progress Note-Follow Up  Subjective  Chief Complaint  Chief Complaint  Patient presents with  . Follow-up    2 month follow up    HPI  Patient is a 60 year old African American male was in today for followup on multiple medical problems. He was hospitalized at the beginning of this month with another episode of atrial fibrillation and also found to have atrial flutter. Despite attempts to correct his rhythm he continues to go in and out of irregular rhythms and he is being evaluated now for further procedures. In the process of working this up they also discovered that he is struggling with exercise induced hypoxia and started him on portable oxygen he denies any recent illness or fevers. He denies any use with exertion. He was not dropping his oxygen status so much at night so he continues on his CPAP but without extra oxygen. No f/c/congestion/CP/GI or GU c/o. The O2 has helped his over all fatigue somewhat and his leg pains  Past Medical History  Diagnosis Date  . Hypertension   . Hyperlipidemia   . Atrial fibrillation   . UNSPECIFIED ANEMIA 01/05/2010  . TESTICULAR HYPOFUNCTION 01/05/2010  . SLEEP APNEA 04/06/2010  . PULMONARY FUNCTION TESTS, ABNORMAL 02/02/2010  . PERIPHERAL NEUROPATHY 01/05/2010  . OBESITY NOS 01/25/2007  . NEUROMA 01/05/2010  . INSOMNIA, HX OF 01/25/2007  . HYPERTENSION 01/25/2007  . FATIGUE, CHRONIC 11/11/2009  . ERECTILE DYSFUNCTION 01/25/2007  . DYSPNEA 12/19/2009  . DM 12/05/2009  . CHF 12/05/2009  . CHEST PAIN-UNSPECIFIED 11/11/2009  . BENIGN PROSTATIC HYPERTROPHY, HX OF 12/05/2009  . Atrial flutter 12/05/2009  . AORTIC STENOSIS, MODERATE 12/05/2009  . Knee pain, right 12/28/2010    Past Surgical History  Procedure Date  . Bilat foot surgery removed part of 5th metatarsal to corect curvature of toes     Family History  Problem Relation Age of Onset  . Clotting disorder Mother   . Heart disease Mother     s/p MI   . Heart attack Mother   . Hypertension Mother   . Diabetes Mother   . Hyperlipidemia Mother   . Stroke Mother   . Cancer Father     ? lung  . Lung disease Father     smoker  . Diabetes Sister   . Hypertension Sister     smoker  . Leukemia Maternal Grandmother     ?  [name] Aneurysm Sister     brain  . Other Sister     clipped  . Seizures Sister     d/o w/aneurysm/ smoker    History   Social History  . Marital Status: Married    Spouse Name: N/A    Number of Children: N/A  . Years of Education: N/A   Occupational History  . Not on file.   Social History Main Topics  . Smoking status: Former Smoker -- 1.0 packs/day for 10 years    Quit date: 06/07/1980  . Smokeless tobacco: Never Used  . Alcohol Use: No  . Drug Use: No  . Sexually Active: Not on file   Other Topics Concern  . Not on file   Social History Narrative  . No narrative on file    Current Outpatient Prescriptions on File Prior to Visit  Medication Sig Dispense Refill  . albuterol-ipratropium (COMBIVENT) 18-103 MCG/ACT inhaler 1 puff po twice daily and then can increase to 2 puffs po three times daily prn  1 Inhaler  3  . aspirin 81 MG tablet Take 81 mg by mouth daily.        . calcium carbonate (TUMS - DOSED IN MG ELEMENTAL CALCIUM) 500 MG chewable tablet Chew 1 tablet by mouth daily as needed.        . diltiazem (CARDIZEM CD) 240 MG 24 hr capsule Take 240 mg by mouth daily.        . fish oil-omega-3 fatty acids 1000 MG capsule Take 2 g by mouth daily.        . furosemide (LASIX) 40 MG tablet Take 40 mg by mouth as directed. 1/2 once daily and 1 extra if weight is over 196       . glyBURIDE (DIABETA) 5 MG tablet Take 1 tablet (5 mg total) by mouth 2 (two) times daily with a meal.  60 tablet  3  . HYDROcodone-acetaminophen (NORCO) 5-325 MG per tablet Take 1 tablet by mouth every 6 (six) hours as needed.        [name] lisinopril (PRINIVIL,ZESTRIL) 20 MG tablet Take 20 mg by mouth 2 (two) times daily.        .  metFORMIN (GLUCOPHAGE) 1000 MG tablet TAKE ONE TABLET BY MOUTH TWICE DAILY  60 tablet  2  . metoprolol (TOPROL-XL) 50 MG 24 hr tablet Take 50 mg by mouth 2 (two) times daily.        . multivitamin (THERAGRAN) per tablet Take 1 tablet by mouth daily.        . potassium chloride SA (K-DUR,KLOR-CON) 20 MEQ tablet Take 20 mEq by mouth daily. 1/2 tab po       . pravastatin (PRAVACHOL) 40 MG tablet Take 40 mg by mouth daily.        . propafenone (RYTHMOL) 225 MG tablet Take 225 mg by mouth 2 (two) times daily.        . temazepam (RESTORIL) 15 MG capsule        . Testosterone 1.25 GM/ACT (1%) GEL Place 1 strip onto the skin daily.        [name] warfarin (COUMADIN) 4 MG tablet Take 4 mg by mouth daily.          Allergies  Allergen Reactions  . Sulfonamide Derivatives     REACTION: unknown reaction    Review of Systems  Review of Systems  Constitutional: Positive for malaise/fatigue. Negative for fever.  HENT: Negative for congestion.   Eyes: Negative for discharge.  Respiratory: Positive for shortness of breath. Negative for cough.   Cardiovascular: Positive for palpitations. Negative for chest pain and leg swelling.  Gastrointestinal: Negative for nausea, abdominal pain and diarrhea.  Genitourinary: Negative for dysuria.  Musculoskeletal: Negative for falls.  Skin: Negative for rash.  Neurological: Negative for loss of consciousness and headaches.  Endo/Heme/Allergies: Negative for polydipsia.  Psychiatric/Behavioral: Negative for depression and suicidal ideas. The patient is not nervous/anxious and does not have insomnia.     Objective  BP 129/84  Pulse 83  Temp(Src) 97.7 F (36.5 C) (Oral)  Ht 5\' 11"  (1.803 m)  Wt 233 lb 1.9 oz (105.743 kg)  BMI 32.51 kg/m2  SpO2 100%  Physical Exam  Physical Exam  Constitutional: He is oriented to person, place, and time and well-developed, well-nourished, and in no distress. No distress.  HENT:  Head: Normocephalic and atraumatic.  Eyes:  Conjunctivae are normal.  Neck: Neck supple. No thyromegaly present.  Cardiovascular: Normal rate and regular rhythm.   Murmur heard. Pulmonary/Chest: Effort normal  and breath sounds normal. No respiratory distress.  Abdominal: He exhibits no distension and no mass. There is no tenderness.  Musculoskeletal: He exhibits no edema.  Neurological: He is alert and oriented to person, place, and time.  Skin: Skin is warm.  Psychiatric: Memory, affect and judgment normal.    Lab Results  Component Value Date   TSH 0.75 03/24/2011   Lab Results  Component Value Date   WBC 4.0 04/08/2011   HGB 12.7* 04/08/2011   HCT 39.8 04/08/2011   MCV 81.1 04/08/2011   PLT 242 04/08/2011   Lab Results  Component Value Date   CREATININE 1.04 04/08/2011   BUN 16 04/08/2011   NA 142 04/08/2011   K 4.2 04/08/2011   CL 107 04/08/2011   CO2 24 04/08/2011   Lab Results  Component Value Date   ALT 17 04/08/2011   AST 18 04/08/2011   ALKPHOS 41 04/08/2011   BILITOT 0.6 04/08/2011   Lab Results  Component Value Date   CHOL 103 03/24/2011   Lab Results  Component Value Date   HDL 40.80 03/24/2011   Lab Results  Component Value Date   LDLCALC 43 03/24/2011   Lab Results  Component Value Date   TRIG 95.0 03/24/2011   Lab Results  Component Value Date   CHOLHDL 3 03/24/2011     Assessment & Plan  Hypoxia Does not need O2 qhs according to a nocturnal oxygen. Has to carry O2 with him now and gets easily winded paper work is completed today for handicap placard  HYPERTENSION Well controlled at today's visit, no changes.  DM hgba1c 8.3 in October but is reporting sugars as low as 69 typical  Hyperlipidemia Tolerating Pravastatin, will check lipids at next appt. Avoid trans fats.  UNSPECIFIED ANEMIA Mild will continue to monitor

## 2011-05-05 NOTE — Assessment & Plan Note (Signed)
Well controlled at today's visit, no changes 

## 2011-05-05 NOTE — Assessment & Plan Note (Signed)
Tolerating Pravastatin, will check lipids at next appt. Avoid trans fats.

## 2011-05-05 NOTE — Assessment & Plan Note (Signed)
Mild will continue to monitor 

## 2011-05-05 NOTE — Patient Instructions (Addendum)
Insomnia Insomnia means you have trouble falling or staying asleep. It affects about one person in three at different times and is usually related to stress from work, school, or personal relations. Insomnia is also a sign of depression or anxiety. Other medical problems that cause insomnia include conditions that cause pain, night leg cramps, coughing, shortness of breath, urinary problems, and fevers. Sleep apnea is an abnormal breathing pattern at night that can cause insomnia and loud snoring. Certain medications and excess intake of caffeine drinks (coffee, tea, colas) can also interfere with normal sleep. Treatment for insomnia depends on the cause. Besides specific medical treatment, the following measures can help you relax and get better sleep. Get regular exercise every day, at least several hours before bed time. Try to get to bed at the same time every night. Take a hot bath before retiring to help you relax. Do not stay in bed if you are unable to sleep. During the daytime avoid staying in bed to watch television, eat, or read. Reduce unwanted noise and light in your room. Keep your room at a comfortable temperature. Avoid alcohol as it causes one to sleep less soundly, may cause you to awaken during the night, and can leave you feeling groggy the next day. Using a mild sedative prescribed or suggested by your caregiver may be needed, but the daily use of sleeping pills is not recommended. Anti-depressant medicines can improve sleep in people with depression. Please call your doctor for follow up care to better understand the cause and proper treatment of your insomnia. Document Released: 07/01/2004 Document Revised: 02/03/2011 Document Reviewed: 05/24/2005 [facility] Patient Information 2012 [place], [place].  Try Aspercreme around the external right ear, notify us if symptoms worsen

## 2011-05-10 ENCOUNTER — Ambulatory Visit (INDEPENDENT_AMBULATORY_CARE_PROVIDER_SITE_OTHER): Payer: BC Managed Care – PPO | Admitting: Pulmonary Disease

## 2011-05-10 ENCOUNTER — Encounter: Payer: Self-pay | Admitting: Pulmonary Disease

## 2011-05-10 DIAGNOSIS — G4730 Sleep apnea, unspecified: Secondary | ICD-10-CM

## 2011-05-10 DIAGNOSIS — Z9189 Other specified personal risk factors, not elsewhere classified: Secondary | ICD-10-CM

## 2011-05-10 DIAGNOSIS — R942 Abnormal results of pulmonary function studies: Secondary | ICD-10-CM

## 2011-05-10 NOTE — Patient Instructions (Addendum)
Use of CPAP increases your chances of staying in sinus rhythm Ambulatory saturation DOFETILIDE - discuss with dr Ladona Ridgel OK to use temazepam as needed We discussed side effects of combivent - NO more than once every 12 h

## 2011-05-10 NOTE — Progress Notes (Signed)
  Subjective:    Patient ID: Marisa Severin, male    DOB: 1951-05-07, 60 y.o.   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 Tachycardia induced)  He has longstanding insomnia, witnessed apneas, loud snoring, excessive daytime fatigue, and non-refreshing and restless sleep. At the time of the study, he weighed 225 pounds with a height of 5 feet and 11 inches, BMI of 31, neck size of 14 inches worked 3 rd shift x 6 yrs, last worked 3-10p  PSG dec '11>> Sleep was very fragmented due to frequent arousals and awakenings, especially in the second half of the night with CPAP. He was desensitized with a standard nasal mask. Note that he took 2 tablets of temazepam of 15 mg prior to the study.  Severe obstructive sleep apnea >>AHI was 42/h with lowest desatn of 86 % >>corrected by CPAP of 8 cm with a nasal mask. Poor sleep efficiency which is consistent with the patient's history of insomnia of sleep maintenance. >> started on ambien & CPAP   Download 12/28 -07/02/10 >> god compliance avg 6.5h, AHI 15/h, centrals 9/h, obstructive 4.5/h, avg pr 8 cm  Download on 10 cm 2/28-3/28/12 >> few residual events, some central apneas persist  Ambien 5 mg x 30 given 3/12   05/10/2011 Refractory afibn- flutter  s/p cardioversion, ablation >> reviewed EP note - dofetilide planned if recurs PFT -> 01/2010 - TLC 77%, DLCO 56%, FVC 3.5L/72%, RAtio 77 ,FEV 1 78%  COmpliant with CPAP - on temazepam 15 mg now, prefers this to [place] - discussed long term risks   Review of Systems Patient denies significant dyspnea,cough, hemoptysis,  chest pain, palpitations, pedal edema, orthopnea, paroxysmal nocturnal dyspnea, lightheadedness, nausea, vomiting, abdominal or  leg pains      Objective:   Physical Exam  Gen. Pleasant, well-nourished, in no distress ENT - no lesions, no post nasal  drip Neck: No JVD, no thyromegaly, no carotid bruits Lungs: no use of accessory muscles, no dullness to percussion, clear without rales or rhonchi  Cardiovascular: Rhythm regular, heart sounds  normal, no murmurs or gallops, no peripheral edema Musculoskeletal: No deformities, no cyanosis or clubbing        Assessment & Plan:

## 2011-05-12 NOTE — Assessment & Plan Note (Signed)
Long term side effects discussed - behaviour techniques discussed OK to use temazepam as needed

## 2011-05-12 NOTE — Assessment & Plan Note (Signed)
Use of CPAP increases your chances of staying in sinus rhythm Weight loss encouraged, compliance with goal of at least 4-6 hrs every night is the expectation. Advised against medications with sedative side effects Cautioned against driving when sleepy - understanding that sleepiness will vary on a day to day basis

## 2011-05-12 NOTE — Assessment & Plan Note (Signed)
We discussed side effects of combivent - NO more than once every 12 h No airway obstruction Completed pulm rehab

## 2011-05-14 ENCOUNTER — Encounter: Payer: Self-pay | Admitting: Vascular Surgery

## 2011-05-17 ENCOUNTER — Ambulatory Visit (INDEPENDENT_AMBULATORY_CARE_PROVIDER_SITE_OTHER): Payer: BC Managed Care – PPO | Admitting: Vascular Surgery

## 2011-05-17 ENCOUNTER — Encounter: Payer: Self-pay | Admitting: Vascular Surgery

## 2011-05-17 VITALS — BP 143/88 | HR 100 | Resp 20 | Ht 71.0 in | Wt 232.0 lb

## 2011-05-17 DIAGNOSIS — I83893 Varicose veins of bilateral lower extremities with other complications: Secondary | ICD-10-CM

## 2011-05-17 NOTE — Progress Notes (Signed)
Subjective:     Patient ID: [name], male   DOB: 11/14/1950, 60 y.o.   MRN: [id number]  HPI this 60 year old male returns for further followup regarding his severe venous insufficiency of the left leg. He has a previous history of thrombophlebitis in the left calf few months ago and that is now resolved. He is on long-term Coumadin for atrial fibrillation but did have cardioversion 04/08/2011 and has remained in sinus rhythm. He has no history of embolic events. He continues to have aching throbbing and burning discomfort in the left thigh and calf which worsens as the day progresses. He has tried wearing long leg elastic compression stockings (20-30 mm gradient) as well as elevation of legs and ibuprofen but has had no improvement in his symptoms. Symptoms seem to be worsening.  Past Medical History  Diagnosis Date  . Hypertension   . Hyperlipidemia   . Atrial fibrillation   . UNSPECIFIED ANEMIA 01/05/2010  . TESTICULAR HYPOFUNCTION 01/05/2010  . SLEEP APNEA 04/06/2010  . PULMONARY FUNCTION TESTS, ABNORMAL 02/02/2010  . PERIPHERAL NEUROPATHY 01/05/2010  . OBESITY NOS 01/25/2007  . NEUROMA 01/05/2010  . INSOMNIA, HX OF 01/25/2007  . HYPERTENSION 01/25/2007  . FATIGUE, CHRONIC 11/11/2009  . ERECTILE DYSFUNCTION 01/25/2007  . DYSPNEA 12/19/2009  . DM 12/05/2009  . CHF 12/05/2009  . CHEST PAIN-UNSPECIFIED 11/11/2009  . BENIGN PROSTATIC HYPERTROPHY, HX OF 12/05/2009  . Atrial flutter 12/05/2009  . AORTIC STENOSIS, MODERATE 12/05/2009  . Knee pain, right 12/28/2010  . Hypoxia 05/05/2011    History  Substance Use Topics  . Smoking status: Former Smoker -- 1.0 packs/day for 10 years    Quit date: 06/07/1980  . Smokeless tobacco: Never Used  . Alcohol Use: No    Family History  Problem Relation Age of Onset  . Clotting disorder Mother   . Heart disease Mother     s/p MI  . Heart attack Mother   . Hypertension Mother   . Diabetes Mother   . Hyperlipidemia Mother   . Stroke Mother   . Cancer Father      ? lung  . Lung disease Father     smoker  . Diabetes Sister   . Hypertension Sister     smoker  . Leukemia Maternal Grandmother     ?  [name] Aneurysm Sister     brain  . Other Sister     clipped  . Seizures Sister     d/o w/aneurysm/ smoker    Allergies  Allergen Reactions  . Sulfonamide Derivatives     REACTION: unknown reaction    Current outpatient prescriptions:albuterol-ipratropium (COMBIVENT) 18-103 MCG/ACT inhaler, 1 puff po twice daily and then can increase to 2 puffs po three times daily prn, Disp: 1 Inhaler, Rfl: 3;  aspirin 81 MG tablet, Take 81 mg by mouth daily.  , Disp: , Rfl: ;  calcium carbonate (TUMS - DOSED IN MG ELEMENTAL CALCIUM) 500 MG chewable tablet, Chew 1 tablet by mouth daily as needed.  , Disp: , Rfl:  diltiazem (CARDIZEM CD) 240 MG 24 hr capsule, Take 240 mg by mouth daily.  , Disp: , Rfl: ;  fish oil-omega-3 fatty acids 1000 MG capsule, Take 2 g by mouth daily.  , Disp: , Rfl: ;  furosemide (LASIX) 40 MG tablet, Take 40 mg by mouth as directed. 1/2 once daily and 1 extra if weight is over 196 , Disp: , Rfl: ;  glyBURIDE (DIABETA) 5 MG tablet, Take 1 tablet (5 mg  total) by mouth 2 (two) times daily with a meal., Disp: 60 tablet, Rfl: 3 HYDROcodone-acetaminophen (NORCO) 5-325 MG per tablet, Take 1 tablet by mouth every 6 (six) hours as needed.  , Disp: , Rfl: ;  lisinopril (PRINIVIL,ZESTRIL) 20 MG tablet, Take 20 mg by mouth 2 (two) times daily.  , Disp: , Rfl: ;  metFORMIN (GLUCOPHAGE) 1000 MG tablet, TAKE ONE TABLET BY MOUTH TWICE DAILY, Disp: 60 tablet, Rfl: 2;  metoprolol (TOPROL-XL) 50 MG 24 hr tablet, Take 50 mg by mouth 2 (two) times daily.  , Disp: , Rfl:  multivitamin (THERAGRAN) per tablet, Take 1 tablet by mouth daily.  , Disp: , Rfl: ;  potassium chloride SA (K-DUR,KLOR-CON) 20 MEQ tablet, Take 20 mEq by mouth daily. 1/2 tab po , Disp: , Rfl: ;  pravastatin (PRAVACHOL) 40 MG tablet, Take 40 mg by mouth daily.  , Disp: , Rfl: ;  propafenone (RYTHMOL)  225 MG tablet, Take 225 mg by mouth 2 (two) times daily.  , Disp: , Rfl:  temazepam (RESTORIL) 15 MG capsule, Take 1 capsule (15 mg total) by mouth at bedtime as needed for sleep., Disp: 30 capsule, Rfl: 5;  Testosterone 1.25 GM/ACT (1%) GEL, Place 1 strip onto the skin daily.  , Disp: , Rfl: ;  warfarin (COUMADIN) 4 MG tablet, Take 4 mg by mouth daily.  , Disp: , Rfl:   BP 143/88  Pulse 100  Resp 20  Ht 5\' 11"  (1.803 m)  Wt 232 lb (105.235 kg)  BMI 32.36 kg/m2  Body mass index is 32.36 kg/(m^2).         Review of Systems he denies chest pain, dyspnea on exertion, PND, orthopnea, or claudication     Objective:   Physical Exam blood pressure today 143/88 heart rate 100 respirations 20 Chest no rhonchi or wheezing Cardiovascular he has a regular rhythm with no murmurs Lower extremity exam on the left reveals resolving thrombophlebitis in the left medial calf varicosities which are bulging and mildly tender. He has not 1+ distal edema. There are also some posterior calf varicosities. He has 2+ dorsalis pedis pulse palpable in the left foot.    Assessment:     Severe venous insufficiency left leg secondary to reflux in left great saphenous system and left small saphenous system with secondary bulging varicosities. These are symptomatic and affecting his daily living appear    Plan:     #1 Will proceed with precertification to perform laser ablation of left great saphenous vein with 10-20 stab phlebectomy is to be followed by laser ablation of left small saphenous vein. We will need discontinuing Coumadin therapy for 5 days prior to procedure.

## 2011-05-19 ENCOUNTER — Other Ambulatory Visit: Payer: Self-pay | Admitting: Family Medicine

## 2011-06-11 ENCOUNTER — Telehealth: Payer: Self-pay | Admitting: Pulmonary Disease

## 2011-06-11 DIAGNOSIS — R0902 Hypoxemia: Secondary | ICD-10-CM

## 2011-06-11 NOTE — Telephone Encounter (Signed)
Spoke with patient and is very upset that his o2 thru [facility] hasnt been d/c.pt stated that he was told in office on 12/3 he no longer needed o2 an now has a high bill from [facility] . [facility] wont pick up machine with out d/c order. DR Vassie Loll is it ok to send over d/c orders? Thank you

## 2011-06-11 NOTE — Telephone Encounter (Signed)
Pt is upset & stated that his bill is now over $500.00 & that he is not able to afford that at all.  Pt would like to get this taken care of & requests a call with an update.  Antionette Fairy

## 2011-06-13 NOTE — Telephone Encounter (Signed)
Ok to send dc O2 order

## 2011-06-14 NOTE — Telephone Encounter (Signed)
Spoke with pt and notified that I have sent the order from [facility] to have them pick up o2. Pt states that the o2 was already picked up. Nothing further needed.

## 2011-07-07 ENCOUNTER — Other Ambulatory Visit: Payer: Self-pay

## 2011-07-07 MED ORDER — WARFARIN SODIUM 4 MG PO TABS: 4.0000 mg | ORAL_TABLET | Freq: Every day | ORAL | Status: DC

## 2011-07-22 ENCOUNTER — Other Ambulatory Visit (INDEPENDENT_AMBULATORY_CARE_PROVIDER_SITE_OTHER): Payer: BC Managed Care – PPO

## 2011-07-22 ENCOUNTER — Other Ambulatory Visit: Payer: BC Managed Care – PPO

## 2011-07-22 DIAGNOSIS — I10 Essential (primary) hypertension: Secondary | ICD-10-CM

## 2011-07-22 LAB — RENAL FUNCTION PANEL
Albumin: 3.8 g/dL (ref 3.5–5.2)
BUN: 15 mg/dL (ref 6–23)
Chloride: 107 mEq/L (ref 96–112)
GFR: 84.69 mL/min (ref >60.00)
Glucose, Bld: 129 mg/dL — ABNORMAL HIGH (ref 70–99)
Phosphorus: 3 mg/dL (ref 2.3–4.6)

## 2011-07-22 LAB — LIPID PANEL
Cholesterol: 127 mg/dL (ref 0–200)
LDL Cholesterol: 74 mg/dL (ref 0–99)
Total CHOL/HDL Ratio: 3
Triglycerides: 83 mg/dL (ref 0.0–149.0)
VLDL: 16.6 mg/dL (ref 0.0–40.0)

## 2011-07-22 LAB — CBC
HCT: 45.4 % (ref 39.0–52.0)
MCHC: 32.3 g/dL (ref 30.0–36.0)
MCV: 79.2 fl (ref 78.0–100.0)
RBC: 5.74 Mil/uL (ref 4.22–5.81)
RDW: 16.6 % — ABNORMAL HIGH (ref 11.5–14.6)

## 2011-07-22 LAB — HEPATIC FUNCTION PANEL
ALT: 17 U/L (ref 0–53)
AST: 17 U/L (ref 0–37)
Bilirubin, Direct: 0.1 mg/dL (ref 0.0–0.3)
Total Bilirubin: 0.7 mg/dL (ref 0.3–1.2)

## 2011-07-22 LAB — HEMOGLOBIN A1C: Hgb A1c MFr Bld: 7.3 % — ABNORMAL HIGH (ref 4.6–6.5)

## 2011-07-26 ENCOUNTER — Telehealth: Payer: Self-pay | Admitting: Family Medicine

## 2011-07-26 NOTE — Telephone Encounter (Signed)
Is patient's bloodwork results avail to his cardiologist Dr Regan Rakers? Patient also wants to check to see if he is due to see Dr Abner Greenspan.

## 2011-07-26 NOTE — Telephone Encounter (Signed)
Lab results routed to cardiologist.  Copy mailed to patient.  Pt has appt with cardiology on Wednesday.  Advised pt he is due for appt with Dr. Abner Greenspan, but if he wants to wait until he finds out if he will need additional cardiology appt/procedures before making appt that would be OK.  Pt is agreeable with that plan.  He will call Wednesday or Thursday to schedule appt.

## 2011-07-30 ENCOUNTER — Encounter (HOSPITAL_COMMUNITY): Payer: Self-pay | Admitting: Pharmacy Technician

## 2011-08-02 ENCOUNTER — Other Ambulatory Visit: Payer: Self-pay

## 2011-08-02 ENCOUNTER — Inpatient Hospital Stay (HOSPITAL_COMMUNITY)
Admission: RE | Admit: 2011-08-02 | Discharge: 2011-08-05 | DRG: 544 | Disposition: A | Discharge status: Home / Self Care | Payer: BC Managed Care – PPO | Source: Ambulatory Visit | Attending: Cardiovascular Disease | Admitting: Cardiovascular Disease

## 2011-08-02 ENCOUNTER — Encounter (HOSPITAL_COMMUNITY): Payer: Self-pay | Admitting: General Practice

## 2011-08-02 DIAGNOSIS — I5030 Unspecified diastolic (congestive) heart failure: Secondary | ICD-10-CM | POA: Diagnosis present

## 2011-08-02 DIAGNOSIS — G4730 Sleep apnea, unspecified: Secondary | ICD-10-CM | POA: Diagnosis present

## 2011-08-02 DIAGNOSIS — J449 Chronic obstructive pulmonary disease, unspecified: Secondary | ICD-10-CM | POA: Diagnosis present

## 2011-08-02 DIAGNOSIS — G609 Hereditary and idiopathic neuropathy, unspecified: Secondary | ICD-10-CM | POA: Diagnosis present

## 2011-08-02 DIAGNOSIS — Z7982 Long term (current) use of aspirin: Secondary | ICD-10-CM

## 2011-08-02 DIAGNOSIS — I2789 Other specified pulmonary heart diseases: Secondary | ICD-10-CM | POA: Diagnosis present

## 2011-08-02 DIAGNOSIS — N529 Male erectile dysfunction, unspecified: Secondary | ICD-10-CM | POA: Diagnosis present

## 2011-08-02 DIAGNOSIS — Z79899 Other long term (current) drug therapy: Secondary | ICD-10-CM

## 2011-08-02 DIAGNOSIS — J4489 Other specified chronic obstructive pulmonary disease: Secondary | ICD-10-CM | POA: Diagnosis present

## 2011-08-02 DIAGNOSIS — Z7901 Long term (current) use of anticoagulants: Secondary | ICD-10-CM

## 2011-08-02 DIAGNOSIS — E669 Obesity, unspecified: Secondary | ICD-10-CM | POA: Diagnosis present

## 2011-08-02 DIAGNOSIS — I4892 Unspecified atrial flutter: Secondary | ICD-10-CM | POA: Diagnosis present

## 2011-08-02 DIAGNOSIS — I10 Essential (primary) hypertension: Secondary | ICD-10-CM | POA: Diagnosis present

## 2011-08-02 DIAGNOSIS — I359 Nonrheumatic aortic valve disorder, unspecified: Secondary | ICD-10-CM | POA: Diagnosis present

## 2011-08-02 DIAGNOSIS — I509 Heart failure, unspecified: Secondary | ICD-10-CM | POA: Diagnosis present

## 2011-08-02 DIAGNOSIS — D649 Anemia, unspecified: Secondary | ICD-10-CM | POA: Diagnosis present

## 2011-08-02 DIAGNOSIS — I480 Paroxysmal atrial fibrillation: Secondary | ICD-10-CM | POA: Diagnosis present

## 2011-08-02 DIAGNOSIS — E785 Hyperlipidemia, unspecified: Secondary | ICD-10-CM | POA: Diagnosis present

## 2011-08-02 DIAGNOSIS — I4891 Unspecified atrial fibrillation: Principal | ICD-10-CM

## 2011-08-02 DIAGNOSIS — G4733 Obstructive sleep apnea (adult) (pediatric): Secondary | ICD-10-CM | POA: Diagnosis present

## 2011-08-02 HISTORY — DX: Cardiac murmur, unspecified: R01.1

## 2011-08-02 LAB — PROTIME-INR: INR: 2.96 — ABNORMAL HIGH (ref 0.00–1.49)

## 2011-08-02 LAB — CBC
Hemoglobin: 15 g/dL (ref 13.0–17.0)
RBC: 5.74 MIL/uL (ref 4.22–5.81)

## 2011-08-02 LAB — BASIC METABOLIC PANEL
CO2: 26 mEq/L (ref 19–32)
GFR calc non Af Amer: 64 mL/min — ABNORMAL LOW (ref >90)
Glucose, Bld: 77 mg/dL (ref 70–99)
Potassium: 4.2 mEq/L (ref 3.5–5.1)
Sodium: 143 mEq/L (ref 135–145)

## 2011-08-02 MED ORDER — ASPIRIN EC 81 MG PO TBEC
81.0000 mg | DELAYED_RELEASE_TABLET | Freq: Every day | ORAL | Status: DC
  Administered 2011-08-02 – 2011-08-05 (×4): 81 mg via ORAL
  Filled 2011-08-02 (×4): qty 1

## 2011-08-02 MED ORDER — TESTOSTERONE 20.25 MG/1.25GM (1.62%) TD GEL
1.0000 | Freq: Every day | TRANSDERMAL | Status: DC
  Administered 2011-08-05: 1 via TRANSDERMAL

## 2011-08-02 MED ORDER — FUROSEMIDE 20 MG PO TABS
20.0000 mg | ORAL_TABLET | Freq: Every day | ORAL | Status: DC
  Administered 2011-08-03 – 2011-08-05 (×3): 20 mg via ORAL
  Filled 2011-08-02 (×3): qty 2

## 2011-08-02 MED ORDER — POTASSIUM CHLORIDE CRYS ER 10 MEQ PO TBCR
10.0000 meq | EXTENDED_RELEASE_TABLET | Freq: Every day | ORAL | Status: DC
  Administered 2011-08-03 – 2011-08-05 (×3): 10 meq via ORAL
  Filled 2011-08-02 (×3): qty 1

## 2011-08-02 MED ORDER — SIMVASTATIN 20 MG PO TABS
20.0000 mg | ORAL_TABLET | Freq: Every day | ORAL | Status: DC
  Administered 2011-08-03 – 2011-08-04 (×2): 20 mg via ORAL
  Filled 2011-08-02 (×3): qty 1

## 2011-08-02 MED ORDER — DOFETILIDE 500 MCG PO CAPS
500.0000 ug | ORAL_CAPSULE | Freq: Two times a day (BID) | ORAL | Status: DC
  Administered 2011-08-02 – 2011-08-05 (×6): 500 ug via ORAL
  Filled 2011-08-02 (×7): qty 1

## 2011-08-02 MED ORDER — CALCIUM CARBONATE ANTACID 500 MG PO CHEW
1.0000 | CHEWABLE_TABLET | Freq: Every day | ORAL | Status: DC | PRN
  Filled 2011-08-02: qty 1

## 2011-08-02 MED ORDER — METOPROLOL TARTRATE 100 MG PO TABS
100.0000 mg | ORAL_TABLET | Freq: Two times a day (BID) | ORAL | Status: DC
  Administered 2011-08-02 – 2011-08-05 (×6): 100 mg via ORAL
  Filled 2011-08-02 (×7): qty 1

## 2011-08-02 MED ORDER — METFORMIN HCL 500 MG PO TABS: 1000.0000 mg | ORAL_TABLET | Freq: Two times a day (BID) | ORAL | Status: DC

## 2011-08-02 MED ORDER — LISINOPRIL 40 MG PO TABS
40.0000 mg | ORAL_TABLET | Freq: Every day | ORAL | Status: DC
  Administered 2011-08-02 – 2011-08-05 (×4): 40 mg via ORAL
  Filled 2011-08-02 (×4): qty 1

## 2011-08-02 MED ORDER — SODIUM CHLORIDE 0.9 % IJ SOLN
3.0000 mL | Freq: Two times a day (BID) | INTRAMUSCULAR | Status: DC
  Administered 2011-08-03 – 2011-08-05 (×5): 3 mL via INTRAVENOUS

## 2011-08-02 MED ORDER — SODIUM CHLORIDE 0.9 % IV SOLN: 250.0000 mL | INTRAVENOUS | Status: DC | PRN

## 2011-08-02 MED ORDER — METFORMIN HCL 500 MG PO TABS
1000.0000 mg | ORAL_TABLET | Freq: Two times a day (BID) | ORAL | Status: DC
  Administered 2011-08-03 – 2011-08-05 (×5): 1000 mg via ORAL
  Filled 2011-08-02 (×7): qty 2

## 2011-08-02 MED ORDER — OMEGA-3 FATTY ACIDS 1000 MG PO CAPS: 1.0000 g | ORAL_CAPSULE | Freq: Every day | ORAL | Status: DC

## 2011-08-02 MED ORDER — GLYBURIDE 5 MG PO TABS
5.0000 mg | ORAL_TABLET | Freq: Every day | ORAL | Status: DC
  Administered 2011-08-03 – 2011-08-05 (×3): 5 mg via ORAL
  Filled 2011-08-02 (×4): qty 1

## 2011-08-02 MED ORDER — OMEGA-3-ACID ETHYL ESTERS 1 G PO CAPS
1.0000 g | ORAL_CAPSULE | Freq: Every day | ORAL | Status: DC
  Administered 2011-08-03 – 2011-08-05 (×3): 1 g via ORAL
  Filled 2011-08-02 (×3): qty 1

## 2011-08-02 MED ORDER — TESTOSTERONE 20.25 MG/1.25GM (1.62%) TD GEL: 1.0000 | Freq: Every day | TRANSDERMAL | Status: DC

## 2011-08-02 MED ORDER — WARFARIN SODIUM 4 MG PO TABS
4.0000 mg | ORAL_TABLET | Freq: Once | ORAL | Status: DC
  Filled 2011-08-02 (×2): qty 1

## 2011-08-02 MED ORDER — SODIUM CHLORIDE 0.9 % IJ SOLN: 3.0000 mL | INTRAMUSCULAR | Status: DC | PRN

## 2011-08-02 NOTE — Progress Notes (Signed)
ANTICOAGULATION AND TIKOSYN CONSULT NOTE - Initial Consult  Pharmacy Consult for Coumadin Dosing and Tikosyn Monitoring Indication: atrial fibrillation  Allergies  Allergen Reactions  . Sulfonamide Derivatives Other (See Comments)    unknown reaction    Patient Measurements: Height: 5\' 11"  (180.3 cm) Weight: 229 lb (103.874 kg) IBW/kg (Calculated) : 75.3   Vital Signs: Temp: 97.2 F (36.2 C) (02/25 1736) Temp src: Oral (02/25 1736) BP: 148/83 mmHg (02/25 1736) Pulse Rate: 88  (02/25 1736)  Labs: No results found for this basename: HGB:2,HCT:3,PLT:3,APTT:3,LABPROT:3,INR:3,HEPARINUNFRC:3,CREATININE:3,CKTOTAL:3,CKMB:3,TROPONINI:3 in the last 72 hours Estimated Creatinine Clearance: 89.8 ml/min (by C-G formula based on Cr of 1.1).  Medical History: Past Medical History  Diagnosis Date  . Hypertension   . Hyperlipidemia   . Atrial fibrillation   . UNSPECIFIED ANEMIA 01/05/2010  . TESTICULAR HYPOFUNCTION 01/05/2010  . PULMONARY FUNCTION TESTS, ABNORMAL 02/02/2010  . PERIPHERAL NEUROPATHY 01/05/2010  . OBESITY NOS 01/25/2007  . NEUROMA 01/05/2010  . INSOMNIA, HX OF 01/25/2007  . HYPERTENSION 01/25/2007  . FATIGUE, CHRONIC 11/11/2009  . ERECTILE DYSFUNCTION 01/25/2007  . DYSPNEA 12/19/2009  . CHF 12/05/2009  . CHEST PAIN-UNSPECIFIED 11/11/2009  . BENIGN PROSTATIC HYPERTROPHY, HX OF 12/05/2009  . Atrial flutter 12/05/2009  . AORTIC STENOSIS, MODERATE 12/05/2009  . Knee pain, right 12/28/2010  . Hypoxia 05/05/2011  . Heart murmur   . SLEEP APNEA 04/06/2010    uses cpap  . DM 12/05/2009    Medications:  Prescriptions prior to admission  Medication Sig Dispense Refill  . aspirin EC 81 MG tablet Take 81 mg by mouth daily.      . calcium carbonate (TUMS - DOSED IN MG ELEMENTAL CALCIUM) 500 MG chewable tablet Chew 1 tablet by mouth daily as needed. For heartburn      . diltiazem (CARDIZEM CD) 240 MG 24 hr capsule Take 240 mg by mouth daily.        . furosemide (LASIX) 40 MG tablet Take 20-40 mg by  mouth daily. Take one-half tablet (20mg ) once daily and 1 1/2 tablets (60mg ) if weight is over 196.      . glyBURIDE (DIABETA) 5 MG tablet Take 5 mg by mouth daily with breakfast.      . HYDROcodone-acetaminophen (NORCO) 5-325 MG per tablet Take 1 tablet by mouth every 6 (six) hours as needed. For knee pain      . lisinopril (PRINIVIL,ZESTRIL) 40 MG tablet Take 40 mg by mouth daily.      . metFORMIN (GLUCOPHAGE) 1000 MG tablet Take 1,000 mg by mouth 2 (two) times daily with a meal.      . metoprolol (LOPRESSOR) 100 MG tablet Take 100 mg by mouth 2 (two) times daily.      . naproxen (NAPROSYN) 375 MG tablet Take 375 mg by mouth 2 (two) times daily as needed. For arthritis pain      . potassium chloride SA (K-DUR,KLOR-CON) 20 MEQ tablet Take 10 mEq by mouth daily.       . pravastatin (PRAVACHOL) 40 MG tablet Take 40 mg by mouth at bedtime.       . Testosterone (ANDROGEL) 20.25 MG/1.25GM (1.62%) GEL Place 1 strip onto the skin daily.      [name] warfarin (COUMADIN) 4 MG tablet Take 4 mg by mouth See admin instructions. Take 1 tablet (4mg ) on all days EXCEPT on Monday. On Monday, take one-half tablet (2mg ).      . fish oil-omega-3 fatty acids 1000 MG capsule Take 1 g by mouth daily.  Assessment: 61 year old male on Coumadin PTA for Afib admitted 08/02/11 with Afib with RVR in setting of severe diastolic HF to start Tikosyn load per Cardiology for chemical cardioversion. INR today 2.96.  Home Coumadin regimen was 4mg  po daily except 2mg  on Monday. Last dose of Coumadin was 08/01/11.  SCr 1.1, estCrCl~65ml/min, K 4.9,  Magnesium level 1.9.  Goal of Therapy:  INR 2-3 Potassium >4, Magnesium >1.8  Plan:  1. Coumadin 4mg  po x1 dose tonight. 2. Continue Tikosyn 500mg  po BID. Monitor lytes and QTc.  3. Follow-up PT/INR in AM.   Link Snuffer, PharmD, BCPS Clinical Pharmacist [phone] 08/02/2011,7:25 PM

## 2011-08-02 NOTE — H&P (Signed)
Date of Initial H&P: 07/26/2011  History reviewed, patient examined, no change in status. Proceed with planned Tikosyn load for symptomatic AF w RVR in the setting of severe diastolic heart failure and OSA. If chemical cardioversion does not occur, he is scheduled for elective DC cardioversion on Friday.  Thurmon Fair, MD, [facility] [phone] 08/02/2011 6:56 PM

## 2011-08-02 NOTE — Progress Notes (Signed)
[facility] The patient is a 61 year old male with a history of Malignant hypertension, recurrent persistent atrial flutter and atrial fibrillation, resolved tachycardia/related cardiac myopathy, severe obstructive sleep apnea, dyslipidemia, COPD, moderate pulmonary hypertension.  He presents for Tikosyn loading.  Subjective: No complaints other than DOE.  Objective: Vital signs in last 24 hours: Temp:  [97.2 F (36.2 C)] 97.2 F (36.2 C) (02/25 1736) Pulse Rate:  [88] 88  (02/25 1736) Resp:  [20] 20  (02/25 1736) BP: (148)/(83) 148/83 mmHg (02/25 1736) SpO2:  [98 %] 98 % (02/25 1736) Weight:  [103.874 kg (229 lb)] 103.874 kg (229 lb) (02/25 1814)    Intake/Output from previous day:   Intake/Output this shift:    Medications Current Facility-Administered Medications  Medication Dose Route Frequency Provider Last Rate Last Dose  . aspirin EC tablet 81 mg  81 mg Oral Daily Dwana Melena, PA      . calcium carbonate (TUMS - dosed in mg elemental calcium) chewable tablet 200 mg of elemental calcium  1 tablet Oral Daily PRN Dwana Melena, PA      . fish oil-omega-3 fatty acids capsule 1 g  1 g Oral Daily Dwana Melena, PA      . furosemide (LASIX) tablet 20-40 mg  20-40 mg Oral Daily Dwana Melena, PA      . glyBURIDE (DIABETA) tablet 5 mg  5 mg Oral Q breakfast Dwana Melena, PA      . lisinopril (PRINIVIL,ZESTRIL) tablet 40 mg  40 mg Oral Daily Dwana Melena, PA      . metFORMIN (GLUCOPHAGE) tablet 1,000 mg  1,000 mg Oral BID WC Dwana Melena, PA      . metoprolol (LOPRESSOR) tablet 100 mg  100 mg Oral BID Dwana Melena, PA      . potassium chloride (K-DUR,KLOR-CON) CR tablet 10 mEq  10 mEq Oral Daily Dwana Melena, PA      . simvastatin (ZOCOR) tablet 20 mg  20 mg Oral q1800 Dwana Melena, PA      . Testosterone 20.25 MG/1.25GM (1.62%) GEL 1 strip  1 strip Transdermal Daily Dwana Melena, PA        PE: General appearance: alert, cooperative and no  distress Lungs: clear to auscultation bilaterally Heart: irregularly irregular rhythm, 1/6 systolic MM Extremities: No LEE Pulses: 2+ and symmetric  Lab Results:  No results found for this basename: WBC:3,HGB:3,HCT:3,PLT:3 in the last 72 hours BMET No results found for this basename: NA:3,K:3,CL:3,CO2:3,GLUCOSE:3,BUN:3,CREATININE:3,CALCIUM:3 in the last 72 hours PT/INR No results found for this basename: LABPROT:3,INR:3 in the last 72 hours Cholesterol No results found for this basename: CHOL in the last 72 hours Cardiac Enzymes No components found with this basename: TROPONIN:3, CKMB:3  Studies/Results: @RISRSLT2 @   Assessment/Plan   Principal Problem:  *Atrial fibrillation Active Problems:  HYPERTENSION  AORTIC STENOSIS, MODERATE  SLEEP APNEA  COPD (chronic obstructive pulmonary disease)  Plan:  We'll check labs CBC, BMP , magnesium.  Continue home medications as appropriate. Initiate Tikosyn.  Check EKG. monitor QT C. Per protocol.  CPAP.     LOS: 0 days    Jasmarie Coppock W 08/02/2011 6:51 PM

## 2011-08-03 ENCOUNTER — Other Ambulatory Visit: Payer: Self-pay

## 2011-08-03 LAB — BASIC METABOLIC PANEL
GFR calc Af Amer: 72 mL/min — ABNORMAL LOW (ref >90)
GFR calc non Af Amer: 62 mL/min — ABNORMAL LOW (ref >90)
Potassium: 4.2 mEq/L (ref 3.5–5.1)
Sodium: 140 mEq/L (ref 135–145)

## 2011-08-03 LAB — GLUCOSE, CAPILLARY

## 2011-08-03 LAB — MAGNESIUM: Magnesium: 1.8 mg/dL (ref 1.5–2.5)

## 2011-08-03 LAB — PROTIME-INR: Prothrombin Time: 30.4 seconds — ABNORMAL HIGH (ref 11.6–15.2)

## 2011-08-03 MED ORDER — TEMAZEPAM 15 MG PO CAPS
30.0000 mg | ORAL_CAPSULE | Freq: Every day | ORAL | Status: DC
  Administered 2011-08-04 – 2011-08-05 (×2): 30 mg via ORAL
  Filled 2011-08-03 (×2): qty 2

## 2011-08-03 MED ORDER — WARFARIN SODIUM 4 MG PO TABS
4.0000 mg | ORAL_TABLET | Freq: Once | ORAL | Status: AC
  Administered 2011-08-03: 4 mg via ORAL
  Filled 2011-08-03: qty 1

## 2011-08-03 MED ORDER — MAGNESIUM OXIDE 400 MG PO TABS
400.0000 mg | ORAL_TABLET | Freq: Two times a day (BID) | ORAL | Status: AC
  Administered 2011-08-03 (×2): 400 mg via ORAL
  Filled 2011-08-03 (×2): qty 1

## 2011-08-03 NOTE — Progress Notes (Signed)
08-03-11 1403 Tomi Bamberger, RN,BSN Care Management [phone] Benefits check completed 58.75for tikosyn. Pt is aware.

## 2011-08-03 NOTE — Progress Notes (Signed)
   CARE MANAGEMENT NOTE 08/03/2011  Patient:  [name], [name]   Account Number:  [id number]  Date Initiated:  08/03/2011  Documentation initiated by:  Sharrie Rothman  Subjective/Objective Assessment:   pt admitted for tikosyn load     Action/Plan:   pt uses [facility] in [place] and will call to make sure it is available. also doing benefits check and will make pt aware of copayment amount.   Anticipated DC Date:  08/06/2011   Anticipated DC Plan:  HOME/SELF CARE      DC Planning Services  CM consult      Choice offered to / List presented to:             Status of service:  Completed, signed off Medicare Important Message given?   (If response is "NO", the following Medicare IM given date fields will be blank) Date Medicare IM given:   Date Additional Medicare IM given:    Discharge Disposition:  HOME/SELF CARE  Per UR Regulation:    Comments:  08-03-2011 1209 Arlyss Queen, RN Case Management [phone] Will let MD know pt will need script for 7 day supply and no refills. And then an original script with refills. This drug is not available at [facility] on [address]. [facility] in [place] has drug and will make pt aware.

## 2011-08-03 NOTE — Progress Notes (Signed)
UR Completed. Simmons, Gagan Dillion F [phone]  

## 2011-08-03 NOTE — Progress Notes (Signed)
Patient to place himself on his home cpap. RT will continue to monitor.

## 2011-08-03 NOTE — Progress Notes (Signed)
Patient is to place himself on his home cpap unit. RT will continue to monitor. 

## 2011-08-03 NOTE — Progress Notes (Signed)
Proceed with planned Tikosyn load for symptomatic AF w RVR in the setting of severe diastolic heart failure and OSA. If chemical cardioversion does not occur, he is scheduled for elective DC cardioversion on Friday  Subjective: No complaints  Objective: Vital signs in last 24 hours: Temp:  [97.2 F (36.2 C)-98.1 F (36.7 C)] 97.5 F (36.4 C) (02/26 1405) Pulse Rate:  [67-91] 67  (02/26 1405) Resp:  [18-20] 20  (02/26 1405) BP: (106-148)/(65-90) 110/69 mmHg (02/26 1405) SpO2:  [96 %-98 %] 96 % (02/26 1405) Weight:  [103.874 kg (229 lb)] 103.874 kg (229 lb) (02/25 1814) Weight change:  Last BM Date: 08/02/11 Intake/Output from previous day: 02/25 0701 - 02/26 0700 In: -  Out: 1 [Stool:1] Intake/Output this shift:    PE: General: Heart: Lungs: Abd: Ext:    Lab Results:  Basename 08/02/11 2118  WBC 5.9  HGB 15.0  HCT 44.8  PLT 242   BMET  Basename 08/03/11 0440 08/02/11 2118  NA 140 143  K 4.2 4.2  CL 105 106  CO2 27 26  GLUCOSE 101* 77  BUN 20 18  CREATININE 1.25 1.21  CALCIUM 10.0 10.5   No results found for this basename: TROPONINI:2,CK,MB:2 in the last 72 hours  Lab Results  Component Value Date   CHOL 127 07/22/2011   HDL 36.30* 07/22/2011   LDLCALC 74 07/22/2011   TRIG 83.0 07/22/2011   CHOLHDL 3 07/22/2011   Lab Results  Component Value Date   HGBA1C 7.3* 07/22/2011     Lab Results  Component Value Date   TSH 0.98 07/22/2011    EKG: just done this PM, NSR, QT normal.   Studies/Results: No results found.  Medications: I have reviewed the patient's current medications.    [name] aspirin EC  81 mg Oral Daily  . dofetilide  500 mcg Oral Q12H  . furosemide  20-40 mg Oral Daily  . glyBURIDE  5 mg Oral Q breakfast  . lisinopril  40 mg Oral Daily  . magnesium oxide  400 mg Oral BID  . metFORMIN  1,000 mg Oral BID WC  . metoprolol  100 mg Oral BID  . omega-3 acid ethyl esters  1 g Oral Daily  . potassium chloride SA  10 mEq Oral Daily  .  simvastatin  20 mg Oral q1800  . sodium chloride  3 mL Intravenous Q12H  . Testosterone  1 strip Transdermal Daily  . warfarin  4 mg Oral Once  . warfarin  4 mg Oral ONCE-1800  . DISCONTD: fish oil-omega-3 fatty acids  1 g Oral Daily  . DISCONTD: metFORMIN  1,000 mg Oral BID WC  . DISCONTD: Testosterone  1 strip Transdermal Daily  . DISCONTD: warfarin  4 mg Oral Once   Assessment/Plan: Patient Active Problem List  Diagnoses  . NEUROMA  . DM  . TESTICULAR HYPOFUNCTION  . OBESITY NOS  . UNSPECIFIED ANEMIA  . ERECTILE DYSFUNCTION  . PERIPHERAL NEUROPATHY  . HYPERTENSION  . AORTIC STENOSIS, MODERATE  . ATRIAL FLUTTER  . CHF  . SLEEP APNEA  . FATIGUE, CHRONIC  . DYSPNEA  . CHEST PAIN-UNSPECIFIED  . PULMONARY FUNCTION TESTS, ABNORMAL  . BENIGN PROSTATIC HYPERTROPHY, HX OF  . INSOMNIA, HX OF  . Hyperlipidemia  . Knee pain, right  . Hypoxia  . Atrial fibrillation  . COPD (chronic obstructive pulmonary disease)   PLAN: Using CPAP at hs. On Tikosyn 500 mcg every 12 hours.  Pharmacy following coumadin. Now in SR converted  earlier today!   Will add sleeping pill from home to pt's meds.  LOS: 1 day   INGOLD,LAURA R 08/03/2011, 3:34 PM  I have seen & examined the patient & reviewed the chart.  I agree with Corliss Blacker note.  61 year old man with symptomatic Atrial Fibrillation, admitted for Tikosyn loading. Has been on warfarin for anticoagulation prior to admission (dosing now monitored by Pharmacy).    Has received 2 doses of Tikosyn - @ ~1300 today, he converted chemically to NSR - confirmed by ECG.   QT interval stable.  --> will continue with Tikosyn load (day ~1 of 3), but will most likely not need DCCV on Friday.  HTN -- BP stable on ACE-I,BB Add restoril for insomnia. DM -- continue metformin, glyburide HLD - continue statin  Jamarie Mussa W, M.D., M.S. [facility] [address] [place], [place]   [address]  [phone]  08/03/2011 4:34 PM

## 2011-08-03 NOTE — Progress Notes (Signed)
Pt converted to NSR at 11:20 am. Will continue to monitor. Dion Saucier

## 2011-08-03 NOTE — Progress Notes (Signed)
ANTICOAGULATION AND TIKOSYN CONSULT NOTE - Initial Consult  Pharmacy Consult for Coumadin Dosing and Tikosyn Monitoring Indication: atrial fibrillation  Allergies  Allergen Reactions  . Sulfonamide Derivatives Other (See Comments)    unknown reaction    Patient Measurements: Height: 5\' 11"  (180.3 cm) Weight: 229 lb (103.874 kg) IBW/kg (Calculated) : 75.3   Vital Signs: Temp: 98.1 F (36.7 C) (02/26 0636) Temp src: Oral (02/26 0636) BP: 106/65 mmHg (02/26 0636) Pulse Rate: 86  (02/26 0636)  Labs:  Basename 08/03/11 0440 08/02/11 2118  HGB -- 15.0  HCT -- 44.8  PLT -- 242  APTT -- --  LABPROT 30.4* 31.3*  INR 2.85* 2.96*  HEPARINUNFRC -- --  CREATININE 1.25 1.21  CKTOTAL -- --  CKMB -- --  TROPONINI -- --   Estimated Creatinine Clearance: 79 ml/min (by C-G formula based on Cr of 1.25).  Medical History: Past Medical History  Diagnosis Date  . Hypertension   . Hyperlipidemia   . Atrial fibrillation   . UNSPECIFIED ANEMIA 01/05/2010  . TESTICULAR HYPOFUNCTION 01/05/2010  . PULMONARY FUNCTION TESTS, ABNORMAL 02/02/2010  . PERIPHERAL NEUROPATHY 01/05/2010  . OBESITY NOS 01/25/2007  . NEUROMA 01/05/2010  . INSOMNIA, HX OF 01/25/2007  . HYPERTENSION 01/25/2007  . FATIGUE, CHRONIC 11/11/2009  . ERECTILE DYSFUNCTION 01/25/2007  . DYSPNEA 12/19/2009  . CHF 12/05/2009  . CHEST PAIN-UNSPECIFIED 11/11/2009  . BENIGN PROSTATIC HYPERTROPHY, HX OF 12/05/2009  . Atrial flutter 12/05/2009  . AORTIC STENOSIS, MODERATE 12/05/2009  . Knee pain, right 12/28/2010  . Hypoxia 05/05/2011  . Heart murmur   . SLEEP APNEA 04/06/2010    uses cpap  . DM 12/05/2009    Medications:  Prescriptions prior to admission  Medication Sig Dispense Refill  . aspirin EC 81 MG tablet Take 81 mg by mouth daily.      . calcium carbonate (TUMS - DOSED IN MG ELEMENTAL CALCIUM) 500 MG chewable tablet Chew 1 tablet by mouth daily as needed. For heartburn      . diltiazem (CARDIZEM CD) 240 MG 24 hr capsule Take 240 mg by  mouth daily.        . furosemide (LASIX) 40 MG tablet Take 20-40 mg by mouth daily. Take one-half tablet (20mg ) once daily and 1 1/2 tablets (60mg ) if weight is over 196.      . glyBURIDE (DIABETA) 5 MG tablet Take 5 mg by mouth daily with breakfast.      . HYDROcodone-acetaminophen (NORCO) 5-325 MG per tablet Take 1 tablet by mouth every 6 (six) hours as needed. For knee pain      . lisinopril (PRINIVIL,ZESTRIL) 40 MG tablet Take 40 mg by mouth daily.      . metFORMIN (GLUCOPHAGE) 1000 MG tablet Take 1,000 mg by mouth 2 (two) times daily with a meal.      . metoprolol (LOPRESSOR) 100 MG tablet Take 100 mg by mouth 2 (two) times daily.      . naproxen (NAPROSYN) 375 MG tablet Take 375 mg by mouth 2 (two) times daily as needed. For arthritis pain      . potassium chloride SA (K-DUR,KLOR-CON) 20 MEQ tablet Take 10 mEq by mouth daily.       . pravastatin (PRAVACHOL) 40 MG tablet Take 40 mg by mouth at bedtime.       . Testosterone (ANDROGEL) 20.25 MG/1.25GM (1.62%) GEL Place 1 strip onto the skin daily.      [name] warfarin (COUMADIN) 4 MG tablet Take 4 mg by mouth  See admin instructions. Take 1 tablet (4mg ) on all days EXCEPT on Monday. On Monday, take one-half tablet (2mg ).      . fish oil-omega-3 fatty acids 1000 MG capsule Take 1 g by mouth daily.         Assessment: 60 YOM on Coumadin PTA for Afib, admitted 08/02/11 with Afib with RVR in setting of severe diastolic HF.  Patient started Tikosyn load per Cardiology for chemical cardioversion.  Renal function, QTc ( ), K+ and Mag++ appropriate for Tikosyn therapy.  Pharmacy also managing Coumadin and INR is therapeutic at 2.85.  No bleeding documented.   Home Coumadin dose:  4mg  po daily except 2mg  on Monday  Goal of Therapy:  INR 2-3 Potassium >4, Magnesium >1.8   Plan:  - Coumadin 4mg  PO today - Magnesium oxide 400mg  PO BID x 2 doses - Monitor QTc, K+, Mag, renal function, and rhythm while on Tikosyn - Daily PT/INR - F/U d/c plan to  provide education kit and 1-week supply   Phillips Climes, PharmD 08/03/2011,10:53 AM

## 2011-08-04 ENCOUNTER — Other Ambulatory Visit: Payer: Self-pay

## 2011-08-04 LAB — BASIC METABOLIC PANEL
CO2: 26 mEq/L (ref 19–32)
Calcium: 9.9 mg/dL (ref 8.4–10.5)
Chloride: 107 mEq/L (ref 96–112)
Creatinine, Ser: 1.21 mg/dL (ref 0.50–1.35)
Glucose, Bld: 106 mg/dL — ABNORMAL HIGH (ref 70–99)

## 2011-08-04 LAB — PROTIME-INR: INR: 3.07 — ABNORMAL HIGH (ref 0.00–1.49)

## 2011-08-04 LAB — GLUCOSE, CAPILLARY: Glucose-Capillary: 105 mg/dL — ABNORMAL HIGH (ref 70–99)

## 2011-08-04 MED ORDER — WARFARIN - PHARMACIST DOSING INPATIENT
Freq: Every day | Status: DC
  Filled 2011-08-04 (×2): qty 1

## 2011-08-04 MED ORDER — WARFARIN SODIUM 2 MG PO TABS
2.0000 mg | ORAL_TABLET | Freq: Once | ORAL | Status: AC
  Administered 2011-08-04: 2 mg via ORAL
  Filled 2011-08-04: qty 1

## 2011-08-04 NOTE — Progress Notes (Signed)
Subjective: No complaints, no chest pain, no SOB Has had 4 doses of Tikosyn   convertedto SR after 2nd dose.   Objective: Vital signs in last 24 hours: Temp:  [97.5 F (36.4 C)-98.3 F (36.8 C)] 97.9 F (36.6 C) (02/27 0546) Pulse Rate:  [67-79] 75  (02/27 0546) Resp:  [20] 20  (02/27 0546) BP: (98-124)/(69-89) 98/74 mmHg (02/27 0546) SpO2:  [96 %-99 %] 97 % (02/27 0546) Weight change:  Last BM Date: 08/03/11 Intake/Output from previous day: +240 02/26 0701 - 02/27 0700 In: 240 [P.O.:240] Out: -  Intake/Output this shift: Total I/O In: 720 [P.O.:720] Out: -   PE: General:alert oriented male, no complaints Heart:S1S2, RRR, no murmur, gallup rub or click Lungs: Clear without rales, rhonchi or wheezes Abd:+ BS, soft, non tender Ext:no edema.   Lab Results:  [facility] 08/02/11 2118  WBC 5.9  HGB 15.0  HCT 44.8  PLT 242   BMET  Basename 08/04/11 0450 08/03/11 0440  NA 142 140  K 4.0 4.2  CL 107 105  CO2 26 27  GLUCOSE 106* 101*  BUN 22 20  CREATININE 1.21 1.25  CALCIUM 9.9 10.0   No results found for this basename: TROPONINI:2,CK,MB:2 in the last 72 hours  Lab Results  Component Value Date   CHOL 127 07/22/2011   HDL 36.30* 07/22/2011   LDLCALC 74 07/22/2011   TRIG 83.0 07/22/2011   CHOLHDL 3 07/22/2011   Lab Results  Component Value Date   HGBA1C 7.3* 07/22/2011     Lab Results  Component Value Date   TSH 0.98 07/22/2011    Hepatic Function Panel No results found for this basename: PROT,ALBUMIN,AST,ALT,ALKPHOS,BILITOT,BILIDIR,IBILI in the last 72 hours No results found for this basename: CHOL in the last 72 hours No results found for this basename: PROTIME in the last 72 hours    EKG: Orders placed during the hospital encounter of 08/02/11  . EKG 12-LEAD    Studies/Results: No results found.  Medications: I have reviewed the patient's current medications.    [name] aspirin EC  81 mg Oral Daily  . dofetilide  500 mcg Oral Q12H  . furosemide   20-40 mg Oral Daily  . glyBURIDE  5 mg Oral Q breakfast  . lisinopril  40 mg Oral Daily  . magnesium oxide  400 mg Oral BID  . metFORMIN  1,000 mg Oral BID WC  . metoprolol  100 mg Oral BID  . omega-3 acid ethyl esters  1 g Oral Daily  . potassium chloride SA  10 mEq Oral Daily  . simvastatin  20 mg Oral q1800  . sodium chloride  3 mL Intravenous Q12H  . temazepam  30 mg Oral QHS  . Testosterone  1 strip Transdermal Daily  . warfarin  4 mg Oral ONCE-1800   Assessment/Plan: Patient Active Problem List  Diagnoses  . NEUROMA  . DM  . TESTICULAR HYPOFUNCTION  . OBESITY NOS  . UNSPECIFIED ANEMIA  . ERECTILE DYSFUNCTION  . PERIPHERAL NEUROPATHY  . HYPERTENSION  . AORTIC STENOSIS, MODERATE  . ATRIAL FLUTTER  . CHF  . SLEEP APNEA  . FATIGUE, CHRONIC  . DYSPNEA  . CHEST PAIN-UNSPECIFIED  . PULMONARY FUNCTION TESTS, ABNORMAL  . BENIGN PROSTATIC HYPERTROPHY, HX OF  . INSOMNIA, HX OF  . Hyperlipidemia  . Knee pain, right  . Hypoxia  . Atrial fibrillation  . COPD (chronic obstructive pulmonary disease)   PLAN:  Converted after 2 doses of Tikosyn to SR.  QtC stable. ?  Continue to monitor til AM, will D/C planned DCCV for tomorrow. Pt. Ambulating without problems. INR climbing -pharmacy is following.   LOS: 2 days   INGOLD,LAURA R 08/04/2011, 12:31 PM    Patient seen and examined. Agree with assessment and plan. Feels well. Converted to NSR with Tikosyn.  QTc 435 today. Will recheck ECG in am and plan DC tomorrow.   Lennette Bihari, MD, [facility] 08/04/2011 1:58 PM

## 2011-08-04 NOTE — Progress Notes (Signed)
ANTICOAGULATION & TIKOSYN CONSULT NOTE - follow up  Pharmacy Consult for Coumadin Dosing and Tikosyn Monitoring Indication: atrial fibrillation  Allergies  Allergen Reactions  . Sulfonamide Derivatives Other (See Comments)    unknown reaction    Patient Measurements: Height: 5\' 11"  (180.3 cm) Weight: 229 lb (103.874 kg) IBW/kg (Calculated) : 75.3   Vital Signs: Temp: 97.9 F (36.6 C) (02/27 0546) Temp src: Oral (02/27 0546) BP: 98/74 mmHg (02/27 0546) Pulse Rate: 75  (02/27 0546)  Labs:  Alvira Philips 08/04/11 0450 08/03/11 0440 08/02/11 2118  HGB -- -- 15.0  HCT -- -- 44.8  PLT -- -- 242  APTT -- -- --  LABPROT 32.2* 30.4* 31.3*  INR 3.07* 2.85* 2.96*  HEPARINUNFRC -- -- --  CREATININE 1.21 1.25 1.21  CKTOTAL -- -- --  CKMB -- -- --  TROPONINI -- -- --   Estimated Creatinine Clearance: 81.6 ml/min (by C-G formula based on Cr of 1.21).   Assessment: 60 YOM on Coumadin PTA for Afib, admitted 08/02/11 with Afib with RVR in setting of severe diastolic HF.  Patient started Tikosyn load per Cardiology for chemical cardioversion. He converted after 2nd dose of Tikosyn.  Renal function (creat 1.21) QTc, K+= 4,  and Mag++= 2  appropriate for Tikosyn therapy.  Pharmacy also managing Coumadin and INR is slightly high 3.07.  No bleeding documented.   Home Coumadin dose:  4mg  po daily except 2mg  on Monday  Goal of Therapy:  INR 2-3 Potassium >4, Magnesium >1.8   Plan:  - Coumadin 2mg  PO today -continue tikosyn 500 q12 - Monitor QTc, K+, Mag, renal function, and rhythm while on Tikosyn - Daily PT/INR - F/U d/c plan to provide education kit and 1-week supply  Len Childs T  08/04/2011,1:59 PM

## 2011-08-05 ENCOUNTER — Other Ambulatory Visit: Payer: Self-pay

## 2011-08-05 LAB — BASIC METABOLIC PANEL
BUN: 19 mg/dL (ref 6–23)
Calcium: 9.4 mg/dL (ref 8.4–10.5)
GFR calc Af Amer: 77 mL/min — ABNORMAL LOW (ref >90)
GFR calc non Af Amer: 66 mL/min — ABNORMAL LOW (ref >90)
Potassium: 4.1 mEq/L (ref 3.5–5.1)
Sodium: 139 mEq/L (ref 135–145)

## 2011-08-05 LAB — GLUCOSE, CAPILLARY: Glucose-Capillary: 124 mg/dL — ABNORMAL HIGH (ref 70–99)

## 2011-08-05 MED ORDER — DOFETILIDE 500 MCG PO CAPS: 500.0000 ug | ORAL_CAPSULE | Freq: Two times a day (BID) | ORAL | Status: DC

## 2011-08-05 MED ORDER — WARFARIN SODIUM 4 MG PO TABS
4.0000 mg | ORAL_TABLET | Freq: Once | ORAL | Status: DC
  Filled 2011-08-05: qty 1

## 2011-08-05 NOTE — Progress Notes (Signed)
Pt. Seen and examined. Agree with the NP/PA-C note as written.  Stable, no chest pain. In sinus with conversion on tikosyn. QTC is 435 msec. He is tolerating 500 mg BID dosing. Ok for discharge home today.  I have signed Tikosyn prescription and placed in the chart (needs authorized prescriber).  Chrystie Nose, MD, [facility] Attending Cardiologist [facility]

## 2011-08-05 NOTE — Discharge Summary (Signed)
Physician Discharge Summary  Patient ID: [name] MRN: [id number] DOB/AGE: 1951-03-25 61 y.o.  Admit date: 08/02/2011  Discharge date: 08/05/2011  Admission Diagnoses:  Afib  Discharge Diagnoses:  Principal Problem:  *Atrial fibrillation, converted to SR after 2nd dose of Tikosyn. Active Problems:  HYPERTENSION  AORTIC STENOSIS, MODERATE  SLEEP APNEA  COPD (chronic obstructive pulmonary disease)   Discharged Condition: stable  Hospital Course:  The patient is a 61 year old African American male with history of malignant hypertension, diastolic heart failure, recurrent persistent atrial flutter and fibrillation, resolved tachycardia related cardiomyopathy, severe obstructive sleep apnea, dyslipidemia, COPD, moderate pulmonary hypertension. Normal coronary arteries by angiogram completed approximately 2 years ago. His most recent recent echocardiogram showed normal LVF.  The patient was admitted for Tikosyn loading for symptomatic atrial fibrillation.  The patient converted to normal sinus rhythm after second dose of Tikosyn. Serial EKGs were completed and QTC interval remained stable.  The patient has been seen by Dr. Rennis Golden who feels he is stable for DC home.  Patient was given 7 day prescription 4 Tikosyn which will be filled prior to DC.  Consults: None.  Significant Diagnostic Studies: Serial EKGs  Treatments: Tikosyn  Discharge Exam: Blood pressure 104/70, pulse 70, temperature 97.5 F (36.4 C), temperature source Oral, resp. rate 18, height 5\' 11"  (1.803 m), weight 103.874 kg (229 lb), SpO2 99.00%.  Disposition: 01-Home or Self Care  Discharge Orders    Future Orders Please Complete By Expires   Diet - low sodium heart healthy      Increase activity slowly        Medication List  As of 08/05/2011 11:28 AM   STOP taking these medications         diltiazem 240 MG 24 hr capsule         TAKE these medications         ANDROGEL 20.25 MG/1.25GM (1.62%) Gel   Generic drug: Testosterone   Place 1 strip onto the skin daily.      aspirin EC 81 MG tablet   Take 81 mg by mouth daily.      calcium carbonate 500 MG chewable tablet   Commonly known as: TUMS - dosed in mg elemental calcium   Chew 1 tablet by mouth daily as needed. For heartburn      dofetilide 500 MCG capsule   Commonly known as: TIKOSYN   Take 1 capsule (500 mcg total) by mouth every 12 (twelve) hours.      fish oil-omega-3 fatty acids 1000 MG capsule   Take 1 g by mouth daily.      furosemide 40 MG tablet   Commonly known as: LASIX   Take 20-40 mg by mouth daily. Take one-half tablet (20mg ) once daily and 1 1/2 tablets (60mg ) if weight is over 196.      glyBURIDE 5 MG tablet   Commonly known as: DIABETA   Take 5 mg by mouth daily with breakfast.      HYDROcodone-acetaminophen 5-325 MG per tablet   Commonly known as: NORCO   Take 1 tablet by mouth every 6 (six) hours as needed. For knee pain      lisinopril 40 MG tablet   Commonly known as: PRINIVIL,ZESTRIL   Take 40 mg by mouth daily.      metFORMIN 1000 MG tablet   Commonly known as: GLUCOPHAGE   Take 1,000 mg by mouth 2 (two) times daily with a meal.      metoprolol 100 MG tablet  Commonly known as: LOPRESSOR   Take 100 mg by mouth 2 (two) times daily.      naproxen 375 MG tablet   Commonly known as: NAPROSYN   Take 375 mg by mouth 2 (two) times daily as needed. For arthritis pain      potassium chloride SA 20 MEQ tablet   Commonly known as: K-DUR,KLOR-CON   Take 10 mEq by mouth daily.      pravastatin 40 MG tablet   Commonly known as: PRAVACHOL   Take 40 mg by mouth at bedtime.      warfarin 4 MG tablet   Commonly known as: COUMADIN   Take 4 mg by mouth See admin instructions. Take 1 tablet (4mg ) on all days EXCEPT on Monday. On Monday, take one-half tablet (2mg ).           Follow-up Information    Follow up with Thurmon Fair, MD. (08/20/11 at 11:15AM )    Contact information:   [address] [place] [address] [phone]          Signed: Dwana Melena 08/05/2011, 11:28 AM

## 2011-08-05 NOTE — Discharge Summary (Signed)
[name] [name], MD, FACC Attending Cardiologist [facility]  

## 2011-08-05 NOTE — Progress Notes (Signed)
ANTICOAGULATION & TIKOSYN CONSULT NOTE - follow up  Pharmacy Consult for Coumadin Dosing and Tikosyn Monitoring Indication: atrial fibrillation  Allergies  Allergen Reactions  . Sulfonamide Derivatives Other (See Comments)    unknown reaction    Patient Measurements: Height: 5\' 11"  (180.3 cm) Weight: 229 lb (103.874 kg) IBW/kg (Calculated) : 75.3   Vital Signs: Temp: 97.5 F (36.4 C) (02/28 0606) Temp src: Oral (02/28 0606) BP: 104/70 mmHg (02/28 0606) Pulse Rate: 70  (02/28 0606)  Labs:  Basename 08/05/11 0700 08/04/11 0450 08/03/11 0440 08/02/11 2118  HGB -- -- -- 15.0  HCT -- -- -- 44.8  PLT -- -- -- 242  APTT -- -- -- --  LABPROT 30.5* 32.2* 30.4* --  INR 2.87* 3.07* 2.85* --  HEPARINUNFRC -- -- -- --  CREATININE 1.18 1.21 1.25 --  CKTOTAL -- -- -- --  CKMB -- -- -- --  TROPONINI -- -- -- --   Estimated Creatinine Clearance: 83.7 ml/min (by C-G formula based on Cr of 1.18).   Assessment: 60 YOM on Coumadin PTA for Afib, admitted 08/02/11 with Afib with RVR in setting of severe diastolic HF.  Patient started Tikosyn load per Cardiology for chemical cardioversion. He converted after 2nd dose of Tikosyn.  Renal function (creat 1.18) QTc = 428, K+= 4.1,  and Mag++=1.9 ppropriate for Tikosyn therapy.  Pharmacy also managing Coumadin and INR is 2.87, therapeutic. No bleeding reported.  Home Coumadin dose:  4mg  po daily except 2mg  on Monday  Goal of Therapy:  INR 2-3 Potassium >4, Magnesium >1.8   Plan:  - Coumadin 4 mg  today -continue tikosyn 500 q12 - Monitor QTc, K+, Mag, renal function, and rhythm while on Tikosyn - Daily PT/INR - F/U d/c plan to provide education kit and 1-week supply  Len Childs T  08/05/2011,10:33 AM

## 2011-08-05 NOTE — Progress Notes (Signed)
[facility]  Subjective: No CP or SOB.  Objective: Vital signs in last 24 hours: Temp:  [97.5 F (36.4 C)-98.6 F (37 C)] 97.5 F (36.4 C) (02/28 0606) Pulse Rate:  [70-78] 70  (02/28 0606) Resp:  [18] 18  (02/28 0606) BP: (104-119)/(70-78) 104/70 mmHg (02/28 0606) SpO2:  [98 %-99 %] 99 % (02/28 0606) Last BM Date: 08/04/11  Intake/Output from previous day: 02/27 0701 - 02/28 0700 In: 1560 [P.O.:1560] Out: 1 [Stool:1] Intake/Output this shift:    Medications Current Facility-Administered Medications  Medication Dose Route Frequency Provider Last Rate Last Dose  . 0.9 %  sodium chloride infusion  250 mL Intravenous PRN Mihai Croitoru, MD      . aspirin EC tablet 81 mg  81 mg Oral Daily Dwana Melena, PA   81 mg at 08/05/11 0957  . calcium carbonate (TUMS - dosed in mg elemental calcium) chewable tablet 200 mg of elemental calcium  1 tablet Oral Daily PRN Dwana Melena, PA      . dofetilide [facility]) capsule 500 mcg  500 mcg Oral Q12H Mihai Croitoru, MD   500 mcg at 08/05/11 0957  . furosemide (LASIX) tablet 20-40 mg  20-40 mg Oral Daily Dwana Melena, PA   20 mg at 08/05/11 3244  . glyBURIDE (DIABETA) tablet 5 mg  5 mg Oral Q breakfast Dwana Melena, PA   5 mg at 08/05/11 0650  . lisinopril (PRINIVIL,ZESTRIL) tablet 40 mg  40 mg Oral Daily Dwana Melena, PA   40 mg at 08/05/11 0102  . metFORMIN (GLUCOPHAGE) tablet 1,000 mg  1,000 mg Oral BID WC Elonda Husky, PHARMD   1,000 mg at 08/05/11 0650  . metoprolol (LOPRESSOR) tablet 100 mg  100 mg Oral BID Dwana Melena, PA   100 mg at 08/05/11 0959  . omega-3 acid ethyl esters (LOVAZA) capsule 1 g  1 g Oral Daily Elonda Husky, PHARMD   1 g at 08/05/11 0959  . potassium chloride (K-DUR,KLOR-CON) CR tablet 10 mEq  10 mEq Oral Daily Dwana Melena, PA   10 mEq at 08/05/11 0959  . simvastatin (ZOCOR) tablet 20 mg  20 mg Oral q1800 Dwana Melena, PA   20 mg at 08/04/11 1703  . sodium chloride 0.9 %  injection 3 mL  3 mL Intravenous Q12H Mihai Croitoru, MD   3 mL at 08/05/11 0959  . sodium chloride 0.9 % injection 3 mL  3 mL Intravenous PRN Mihai Croitoru, MD      . temazepam (RESTORIL) capsule 30 mg  30 mg Oral QHS Leone Brand, NP   30 mg at 08/05/11 0035  . Testosterone 20.25 MG/1.25GM (1.62%) GEL 1 strip  1 strip Transdermal Daily Elonda Husky, PHARMD   1 strip at 08/05/11 1000  . warfarin (COUMADIN) tablet 2 mg  2 mg Oral ONCE-1800 Herby Abraham, PHARMD   2 mg at 08/04/11 1703  . warfarin (COUMADIN) tablet 4 mg  4 mg Oral ONCE-1800 Herby Abraham, PHARMD      . Warfarin - Pharmacist Dosing Inpatient   Does not apply q1800 Herby Abraham, PHARMD        PE: General appearance: alert, cooperative and no distress Lungs: clear to auscultation bilaterally Heart: regular rate and rhythm, S1, S2 normal, no murmur, click, rub or gallop Extremities: No LEE Pulses: 2+ and symmetric  Lab Results:   Basename 08/02/11 2118  WBC 5.9  HGB 15.0  HCT 44.8  PLT 242   BMET  Basename 08/05/11 0700 08/04/11 0450 08/03/11 0440  NA 139 142 140  K 4.1 4.0 4.2  CL 106 107 105  CO2 23 26 27   GLUCOSE 114* 106* 101*  BUN 19 22 20   CREATININE 1.18 1.21 1.25  CALCIUM 9.4 9.9 10.0   PT/INR  Basename 08/05/11 0700 08/04/11 0450 08/03/11 0440  LABPROT 30.5* 32.2* 30.4*  INR 2.87* 3.07* 2.85*     Assessment/Plan  Principal Problem:  *Atrial fibrillation, converted to SR after 2nd dose of Tikosyn. Active Problems:  HYPERTENSION  AORTIC STENOSIS, MODERATE  SLEEP APNEA  COPD (chronic obstructive pulmonary disease)  Plan: Converted to NSR after second dose of Tikosyn. QTc stable.  No complaints. INR therapeutic.  DC home today?   LOS: 3 days    Jodie Leiner W 08/05/2011 10:40 AM

## 2011-08-06 ENCOUNTER — Encounter (HOSPITAL_COMMUNITY): Admission: RE | Payer: Self-pay | Source: Ambulatory Visit

## 2011-08-06 ENCOUNTER — Inpatient Hospital Stay (HOSPITAL_COMMUNITY)
Admission: RE | Admit: 2011-08-06 | Payer: BC Managed Care – PPO | Source: Ambulatory Visit | Admitting: Cardiovascular Disease

## 2011-08-06 SURGERY — CARDIOVERSION
Anesthesia: Monitor Anesthesia Care

## 2011-08-22 ENCOUNTER — Other Ambulatory Visit: Payer: Self-pay | Admitting: Family Medicine

## 2011-09-07 ENCOUNTER — Ambulatory Visit (INDEPENDENT_AMBULATORY_CARE_PROVIDER_SITE_OTHER): Payer: BC Managed Care – PPO | Admitting: Family Medicine

## 2011-09-07 ENCOUNTER — Encounter: Payer: Self-pay | Admitting: Family Medicine

## 2011-09-07 VITALS — BP 133/82 | HR 67 | Temp 98.1°F | Ht 71.0 in | Wt 223.8 lb

## 2011-09-07 DIAGNOSIS — I8002 Phlebitis and thrombophlebitis of superficial vessels of left lower extremity: Secondary | ICD-10-CM

## 2011-09-07 DIAGNOSIS — I8000 Phlebitis and thrombophlebitis of superficial vessels of unspecified lower extremity: Secondary | ICD-10-CM

## 2011-09-07 DIAGNOSIS — G4700 Insomnia, unspecified: Secondary | ICD-10-CM

## 2011-09-07 DIAGNOSIS — E785 Hyperlipidemia, unspecified: Secondary | ICD-10-CM

## 2011-09-07 DIAGNOSIS — I10 Essential (primary) hypertension: Secondary | ICD-10-CM

## 2011-09-07 DIAGNOSIS — M25569 Pain in unspecified knee: Secondary | ICD-10-CM

## 2011-09-07 DIAGNOSIS — E119 Type 2 diabetes mellitus without complications: Secondary | ICD-10-CM

## 2011-09-07 DIAGNOSIS — M25561 Pain in right knee: Secondary | ICD-10-CM

## 2011-09-07 DIAGNOSIS — Z9189 Other specified personal risk factors, not elsewhere classified: Secondary | ICD-10-CM

## 2011-09-07 DIAGNOSIS — R52 Pain, unspecified: Secondary | ICD-10-CM

## 2011-09-07 HISTORY — DX: Phlebitis and thrombophlebitis of superficial vessels of left lower extremity: I80.02

## 2011-09-07 MED ORDER — TEMAZEPAM 30 MG PO CAPS: 30.0000 mg | ORAL_CAPSULE | Freq: Every evening | ORAL | Status: DC | PRN

## 2011-09-07 MED ORDER — HYDROCODONE-ACETAMINOPHEN 7.5-325 MG PO TABS: 1.0000 | ORAL_TABLET | Freq: Three times a day (TID) | ORAL | Status: AC | PRN

## 2011-09-07 MED ORDER — LORAZEPAM 0.5 MG PO TABS: 0.5000 mg | ORAL_TABLET | Freq: Every evening | ORAL | Status: AC | PRN

## 2011-09-07 MED ORDER — HYDROCODONE-ACETAMINOPHEN 5-325 MG PO TABS: 1.0000 | ORAL_TABLET | Freq: Three times a day (TID) | ORAL | Status: AC | PRN

## 2011-09-07 NOTE — Assessment & Plan Note (Signed)
Encouraged hot compresses, compression hose and elevation of feet. May need to consider ablation in future but patient is not a good surgical candidate at this time

## 2011-09-07 NOTE — Assessment & Plan Note (Signed)
Tolerating Pravastatin, will check lipids with next blood draw. Avoid trans fats

## 2011-09-07 NOTE — Patient Instructions (Signed)

## 2011-09-07 NOTE — Assessment & Plan Note (Signed)
Already taking Temazepam at 30 mg and many other options cannot be utilized due to heart condition and Ticlid use. Will try adding a small dose of Lorazepam 1/2 hour prior to Temazepam dose to try and induce better sleep. Encouraged good sleep hygiene as well

## 2011-09-07 NOTE — Assessment & Plan Note (Signed)
HGBA1C in 2/13 was 7.3, patient has continued his meds, is avoiding simple carbs and has lost several pounds, we will recheck hgba1c in midMay.

## 2011-09-07 NOTE — Assessment & Plan Note (Signed)
Stiffness and pain noted daily, has been told to consider a surgical replacement but he is not a good candidate due to his other medical conditions at this time. Hydrocodone helps to manage the pain. He is given Norco 7.5/325, he is given an rx for tid use prn but encouraged to try and limit to 1 or 2 tabs daily and we will reassess  Pain control at next visit

## 2011-09-07 NOTE — Progress Notes (Signed)
Patient ID: [name], male   DOB: October 03, 1950, 61 y.o.   MRN: [id number] [name] [id number] 1951-01-11 09/07/2011      Progress Note-Follow Up  Subjective  Chief Complaint  Chief Complaint  Patient presents with  . Insomnia  . Knee Pain    both knees     HPI  Patient is a 61 year old African American male who is in today complaining of knee and calf pain. He was admitted to the hospital in February with another episode of uncontrolled atrial fibrillation. Was started on Ticlid and that has actually converted him to sinus rhythm and is doing well since then. He does note he has some easier fatigability with exertion but otherwise has tolerated the treatment well. He is complaining today of persistent right knee pain while he was in the hospital they gave him a small amount of hydrocodone and he did manage his knee pain. He has been told he needs to consider knee replacement but he is no longer a good surgical candidate. He is also complaining of intermittently irritated varicose veins in his left lower extremity over the calf. When he wears his compression hose that symptoms are better but they worsened again routinely in he's been told to consider surgery for this as well but is hesitant to do so.   Past Medical History  Diagnosis Date  . Hypertension   . Hyperlipidemia   . Atrial fibrillation   . UNSPECIFIED ANEMIA 01/05/2010  . TESTICULAR HYPOFUNCTION 01/05/2010  . PULMONARY FUNCTION TESTS, ABNORMAL 02/02/2010  . PERIPHERAL NEUROPATHY 01/05/2010  . OBESITY NOS 01/25/2007  . NEUROMA 01/05/2010  . INSOMNIA, HX OF 01/25/2007  . HYPERTENSION 01/25/2007  . FATIGUE, CHRONIC 11/11/2009  . ERECTILE DYSFUNCTION 01/25/2007  . DYSPNEA 12/19/2009  . CHF 12/05/2009  . CHEST PAIN-UNSPECIFIED 11/11/2009  . BENIGN PROSTATIC HYPERTROPHY, HX OF 12/05/2009  . Atrial flutter 12/05/2009  . AORTIC STENOSIS, MODERATE 12/05/2009  . Knee pain, right 12/28/2010  . Hypoxia 05/05/2011  . Heart murmur   . SLEEP  APNEA 04/06/2010    uses cpap  . DM 12/05/2009  . Superficial thrombophlebitis of left leg 09/07/2011    Past Surgical History  Procedure Date  . Bilat foot surgery removed part of 5th metatarsal to corect curvature of toes     Family History  Problem Relation Age of Onset  . Clotting disorder Mother   . Heart disease Mother     s/p MI  . Heart attack Mother   . Hypertension Mother   . Diabetes Mother   . Hyperlipidemia Mother   . Stroke Mother   . Cancer Father     ? lung  . Lung disease Father     smoker  . Diabetes Sister   . Hypertension Sister     smoker  . Leukemia Maternal Grandmother     ?  [name] Aneurysm Sister     brain  . Other Sister     clipped  . Seizures Sister     d/o w/aneurysm/ smoker    History   Social History  . Marital Status: Married    Spouse Name: N/A    Number of Children: N/A  . Years of Education: N/A   Occupational History  . Not on file.   Social History Main Topics  . Smoking status: Former Smoker -- 1.0 packs/day for 10 years    Quit date: 06/07/1980  . Smokeless tobacco: Never Used  . Alcohol Use: No  . Drug Use:  No  . Sexually Active: Not Currently   Other Topics Concern  . Not on file   Social History Narrative  . No narrative on file    Current Outpatient Prescriptions on File Prior to Visit  Medication Sig Dispense Refill  . aspirin EC 81 MG tablet Take 81 mg by mouth daily.      . calcium carbonate (TUMS - DOSED IN MG ELEMENTAL CALCIUM) 500 MG chewable tablet Chew 1 tablet by mouth daily as needed. For heartburn      . dofetilide (TIKOSYN) 500 MCG capsule Take 1 capsule (500 mcg total) by mouth every 12 (twelve) hours.  60 capsule  11  . fish oil-omega-3 fatty acids 1000 MG capsule Take 1 g by mouth daily.       . furosemide (LASIX) 40 MG tablet Take 20-40 mg by mouth daily. Take one-half tablet (20mg ) once daily and 1 1/2 tablets (60mg ) if weight is over 196.      . glyBURIDE (DIABETA) 5 MG tablet Take 5 mg by mouth  daily with breakfast.      . lisinopril (PRINIVIL,ZESTRIL) 40 MG tablet Take 40 mg by mouth daily.      . metFORMIN (GLUCOPHAGE) 1000 MG tablet TAKE ONE TABLET BY MOUTH TWICE DAILY  60 tablet  3  . metoprolol (LOPRESSOR) 100 MG tablet Take 100 mg by mouth 2 (two) times daily.      . naproxen (NAPROSYN) 375 MG tablet Take 375 mg by mouth 2 (two) times daily as needed. For arthritis pain      . potassium chloride SA (K-DUR,KLOR-CON) 20 MEQ tablet Take 10 mEq by mouth daily.       . pravastatin (PRAVACHOL) 40 MG tablet Take 40 mg by mouth at bedtime.       . Testosterone (ANDROGEL) 20.25 MG/1.25GM (1.62%) GEL Place 1 strip onto the skin daily.      [name] warfarin (COUMADIN) 4 MG tablet Take 4 mg by mouth See admin instructions. Take 1 tablet (4mg ) on all days EXCEPT on Monday. On Monday, take one-half tablet (2mg ).      . DISCONTD: metFORMIN (GLUCOPHAGE) 1000 MG tablet Take 1,000 mg by mouth 2 (two) times daily with a meal.      . temazepam (RESTORIL) 30 MG capsule Take 1 capsule (30 mg total) by mouth at bedtime as needed for sleep.  30 capsule  1    Allergies  Allergen Reactions  . Sulfonamide Derivatives Other (See Comments)    unknown reaction    Review of Systems  Review of Systems  Constitutional: Positive for malaise/fatigue. Negative for fever.  HENT: Negative for congestion.   Eyes: Negative for discharge.  Respiratory: Negative for shortness of breath.   Cardiovascular: Negative for chest pain, palpitations and leg swelling.  Gastrointestinal: Negative for nausea, abdominal pain and diarrhea.  Genitourinary: Negative for dysuria.  Musculoskeletal: Positive for joint pain. Negative for falls.       Right knee pain and lower leg pain  Skin: Negative for rash.  Neurological: Negative for loss of consciousness and headaches.  Endo/Heme/Allergies: Negative for polydipsia.  Psychiatric/Behavioral: Negative for depression and suicidal ideas. The patient has insomnia. The patient is not  nervous/anxious.     Objective  BP 133/82  Pulse 67  Temp(Src) 98.1 F (36.7 C) (Temporal)  Ht 5\' 11"  (1.803 m)  Wt 223 lb 12.8 oz (101.515 kg)  BMI 31.21 kg/m2  SpO2 99%  Physical Exam  Physical Exam  Constitutional: He is oriented  to person, place, and time and well-developed, well-nourished, and in no distress. No distress.  HENT:  Head: Normocephalic and atraumatic.  Eyes: Conjunctivae are normal.  Neck: Neck supple. No thyromegaly present.  Cardiovascular: Normal rate and regular rhythm.   Murmur heard.      1/6 murmur  Varicose veins noted over medial calf, not inflamed  Pulmonary/Chest: Effort normal and breath sounds normal. No respiratory distress.  Abdominal: Soft. Bowel sounds are normal. He exhibits no distension and no mass. There is no tenderness.  Musculoskeletal: He exhibits no edema.  Neurological: He is alert and oriented to person, place, and time.  Skin: Skin is warm.  Psychiatric: Memory, affect and judgment normal.    Lab Results  Component Value Date   TSH 0.98 07/22/2011   Lab Results  Component Value Date   WBC 5.9 08/02/2011   HGB 15.0 08/02/2011   HCT 44.8 08/02/2011   MCV 78.0 08/02/2011   PLT 242 08/02/2011   Lab Results  Component Value Date   CREATININE 1.18 08/05/2011   BUN 19 08/05/2011   NA 139 08/05/2011   K 4.1 08/05/2011   CL 106 08/05/2011   CO2 23 08/05/2011   Lab Results  Component Value Date   ALT 17 07/22/2011   AST 17 07/22/2011   ALKPHOS 36* 07/22/2011   BILITOT 0.7 07/22/2011   Lab Results  Component Value Date   CHOL 127 07/22/2011   Lab Results  Component Value Date   HDL 36.30* 07/22/2011   Lab Results  Component Value Date   LDLCALC 74 07/22/2011   Lab Results  Component Value Date   TRIG 83.0 07/22/2011   Lab Results  Component Value Date   CHOLHDL 3 07/22/2011     Assessment & Plan  DM HGBA1C in 2/13 was 7.3, patient has continued his meds, is avoiding simple carbs and has lost several pounds, we  will recheck hgba1c in midMay.  Hyperlipidemia Tolerating Pravastatin, will check lipids with next blood draw. Avoid trans fats  Superficial thrombophlebitis of left leg Encouraged hot compresses, compression hose and elevation of feet. May need to consider ablation in future but patient is not a good surgical candidate at this time  Knee pain, right Stiffness and pain noted daily, has been told to consider a surgical replacement but he is not a good candidate due to his other medical conditions at this time. Hydrocodone helps to manage the pain. He is given Norco 7.5/325, he is given an rx for tid use prn but encouraged to try and limit to 1 or 2 tabs daily and we will reassess  Pain control at next visit  INSOMNIA, HX OF Already taking Temazepam at 30 mg and many other options cannot be utilized due to heart condition and Ticlid use. Will try adding a small dose of Lorazepam 1/2 hour prior to Temazepam dose to try and induce better sleep. Encouraged good sleep hygiene as well

## 2011-10-09 ENCOUNTER — Other Ambulatory Visit: Payer: Self-pay | Admitting: Family Medicine

## 2011-10-11 ENCOUNTER — Encounter: Payer: Self-pay | Admitting: Family Medicine

## 2011-10-11 ENCOUNTER — Ambulatory Visit (INDEPENDENT_AMBULATORY_CARE_PROVIDER_SITE_OTHER): Payer: BC Managed Care – PPO | Admitting: Family Medicine

## 2011-10-11 VITALS — BP 136/82 | HR 72 | Temp 98.0°F | Ht 71.0 in | Wt 232.4 lb

## 2011-10-11 DIAGNOSIS — G4700 Insomnia, unspecified: Secondary | ICD-10-CM

## 2011-10-11 DIAGNOSIS — G609 Hereditary and idiopathic neuropathy, unspecified: Secondary | ICD-10-CM

## 2011-10-11 DIAGNOSIS — I4891 Unspecified atrial fibrillation: Secondary | ICD-10-CM

## 2011-10-11 DIAGNOSIS — Z9189 Other specified personal risk factors, not elsewhere classified: Secondary | ICD-10-CM

## 2011-10-11 DIAGNOSIS — M25569 Pain in unspecified knee: Secondary | ICD-10-CM

## 2011-10-11 DIAGNOSIS — M25561 Pain in right knee: Secondary | ICD-10-CM

## 2011-10-11 DIAGNOSIS — F411 Generalized anxiety disorder: Secondary | ICD-10-CM

## 2011-10-11 DIAGNOSIS — F419 Anxiety disorder, unspecified: Secondary | ICD-10-CM

## 2011-10-11 DIAGNOSIS — I8002 Phlebitis and thrombophlebitis of superficial vessels of left lower extremity: Secondary | ICD-10-CM

## 2011-10-11 DIAGNOSIS — G4730 Sleep apnea, unspecified: Secondary | ICD-10-CM

## 2011-10-11 MED ORDER — LORAZEPAM 1 MG PO TABS: 1.0000 mg | ORAL_TABLET | Freq: Two times a day (BID) | ORAL | Status: DC | PRN

## 2011-10-11 MED ORDER — HYDROCODONE-ACETAMINOPHEN 10-325 MG PO TABS: 1.0000 | ORAL_TABLET | Freq: Three times a day (TID) | ORAL | Status: DC | PRN

## 2011-10-11 NOTE — Assessment & Plan Note (Signed)
Swollen, painful today. Reviewed xray with patient showing bone on bone degenerative changes. He is unable to proceed with surgical correction as indicated due to his unstable cardiac condition. He has been in and out of atrial fibrillation over past 2 years requiring multiple interventions and hospitalization. Would have to be cleared by cardiology before he could consider surgery. His knee precludes him from walking or standing routinely. Unable to climb ladders or even stairs when his knee is in pain. Will increase his pain meds, Norco to 10/325 and he will check with his neurologist about using Voltaren gel topically as well

## 2011-10-11 NOTE — Progress Notes (Signed)
Patient ID: [name], male   DOB: 09-29-1950, 61 y.o.   MRN: [id number] [name] [id number] 07-25-1950 10/11/2011      Progress Note-Follow Up  Subjective  Chief Complaint  Chief Complaint  Patient presents with  . disability paperwork    HPI  Patient is a 61 year old [place] American male in today for for follow-up they were. He is out of work on [employer] disability and has been struggling with health concerns all year. He has been hospitalized numerous times for atrial fibrillation with rapid ventricular response. He is also struggling with other diagnoses which are significant and debilitating including significant peripheral neuropathy chronic right knee pain, and ongoing fatigue and difficulty concentrating. He continues to use CPAP routinely but still has trouble sleeping. Both falling asleep and staying asleep. Is followed closely by cardiology secondary to his aortic valve disease, as well as, his CHF and atrial fibrillation. He continues to struggle with his vision and most notably has trouble with blurry vision at night. Has limited himself from driving at night as a result. He also has trouble driving in the inclement weather. 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 Up-to-date he does more sitting than anything else but he has to change positions frequently. If he does not he gets increased pain and stiffness as a result. Next dyspneic with even minimal exertion at this point.  Past Medical History  Diagnosis Date  . Hypertension   . Hyperlipidemia   . Atrial fibrillation   . UNSPECIFIED ANEMIA 01/05/2010  . TESTICULAR HYPOFUNCTION 01/05/2010  . PULMONARY FUNCTION TESTS, ABNORMAL 02/02/2010  . PERIPHERAL NEUROPATHY 01/05/2010  . OBESITY NOS 01/25/2007  . NEUROMA 01/05/2010  . INSOMNIA, HX OF 01/25/2007  . HYPERTENSION 01/25/2007  .  FATIGUE, CHRONIC 11/11/2009  . ERECTILE DYSFUNCTION 01/25/2007  . DYSPNEA 12/19/2009  . CHF 12/05/2009  . CHEST PAIN-UNSPECIFIED 11/11/2009  . BENIGN PROSTATIC HYPERTROPHY, HX OF 12/05/2009  . Atrial flutter 12/05/2009  . AORTIC STENOSIS, MODERATE 12/05/2009  . Knee pain, right 12/28/2010  . Hypoxia 05/05/2011  . Heart murmur   . SLEEP APNEA 04/06/2010    uses cpap  . DM 12/05/2009  . Superficial thrombophlebitis of left leg 09/07/2011    Past Surgical History  Procedure Date  . Bilat foot surgery removed part of 5th metatarsal to corect curvature of toes     Family History  Problem Relation Age of Onset  . Clotting disorder Mother   . Heart disease Mother     s/p MI  . Heart attack Mother   . Hypertension Mother   . Diabetes Mother   . Hyperlipidemia Mother   . Stroke Mother   . Cancer Father     ? lung  . Lung disease Father     smoker  . Diabetes Sister   . Hypertension Sister     smoker  . Leukemia Maternal Grandmother     ?  [name] Aneurysm Sister     brain  . Other Sister     clipped  . Seizures Sister     d/o w/aneurysm/ smoker    History   Social History  . Marital Status: Married    Spouse Name: N/A    Number of Children: N/A  . Years of Education: N/A   Occupational History  . Not on file.  Social History Main Topics  . Smoking status: Former Smoker -- 1.0 packs/day for 10 years    Quit date: 06/07/1980  . Smokeless tobacco: Never Used  . Alcohol Use: No  . Drug Use: No  . Sexually Active: Not Currently   Other Topics Concern  . Not on file   Social History Narrative  . No narrative on file    Current Outpatient Prescriptions on File Prior to Visit  Medication Sig Dispense Refill  . aspirin EC 81 MG tablet Take 81 mg by mouth daily.      . calcium carbonate (TUMS - DOSED IN MG ELEMENTAL CALCIUM) 500 MG chewable tablet Chew 1 tablet by mouth daily as needed. For heartburn      . dofetilide (TIKOSYN) 500 MCG capsule Take 1 capsule (500 mcg total) by  mouth every 12 (twelve) hours.  60 capsule  11  . fish oil-omega-3 fatty acids 1000 MG capsule Take 1 g by mouth daily.       . furosemide (LASIX) 40 MG tablet Take 20-40 mg by mouth daily. Take one-half tablet (20mg ) once daily and 1 1/2 tablets (60mg ) if weight is over 196.      . glyBURIDE (DIABETA) 5 MG tablet Take 5 mg by mouth daily with breakfast.      . lisinopril (PRINIVIL,ZESTRIL) 40 MG tablet Take 40 mg by mouth daily.      . metFORMIN (GLUCOPHAGE) 1000 MG tablet TAKE ONE TABLET BY MOUTH TWICE DAILY  60 tablet  3  . metoprolol (LOPRESSOR) 100 MG tablet Take 100 mg by mouth 2 (two) times daily.      . naproxen (NAPROSYN) 375 MG tablet TAKE ONE TABLET BY MOUTH TWICE DAILY WITH FOOD  60 tablet  3  . potassium chloride SA (K-DUR,KLOR-CON) 20 MEQ tablet Take 10 mEq by mouth daily.       . pravastatin (PRAVACHOL) 40 MG tablet Take 40 mg by mouth at bedtime.       . Testosterone (ANDROGEL) 20.25 MG/1.25GM (1.62%) GEL Place 1 strip onto the skin daily.      [name] warfarin (COUMADIN) 4 MG tablet Take 4 mg by mouth See admin instructions. Take 1 tablet (4mg ) on all days EXCEPT on Monday. On Monday, take one-half tablet (2mg ).      . temazepam (RESTORIL) 30 MG capsule Take 1 capsule (30 mg total) by mouth at bedtime as needed for sleep.  30 capsule  1    Allergies  Allergen Reactions  . Sulfonamide Derivatives Other (See Comments)    unknown reaction    Review of Systems  Review of Systems  Constitutional: Positive for malaise/fatigue. Negative for fever.  HENT: Negative for congestion.   Eyes: Negative for discharge.  Respiratory: Negative for shortness of breath.   Cardiovascular: Positive for palpitations and orthopnea. Negative for chest pain and leg swelling.  Gastrointestinal: Negative for nausea, abdominal pain and diarrhea.  Genitourinary: Negative for dysuria.  Musculoskeletal: Positive for joint pain. Negative for falls.       Right knee pain and swelling  Skin: Negative for  rash.  Neurological: Positive for sensory change and weakness. Negative for loss of consciousness and headaches.       Numbness in all 4 extremities on hands and feet  Endo/Heme/Allergies: Negative for polydipsia.  Psychiatric/Behavioral: Negative for depression and suicidal ideas. The patient is not nervous/anxious and does not have insomnia.     Objective  BP 136/82  Pulse 72  Temp(Src) 98 F (36.7 C) (  Temporal)  Ht 5\' 11"  (1.803 m)  Wt 232 lb 6.4 oz (105.416 kg)  BMI 32.41 kg/m2  SpO2 98%  Physical Exam  Physical Exam  Constitutional: He is oriented to person, place, and time and well-developed, well-nourished, and in no distress. No distress.  HENT:  Head: Normocephalic and atraumatic.  Eyes: Conjunctivae are normal.  Neck: Neck supple. No thyromegaly present.  Cardiovascular: Normal rate.   Murmur heard.      2/6 sys M, irregularly irregular  Pulmonary/Chest: Effort normal and breath sounds normal. No respiratory distress.  Abdominal: He exhibits no distension and no mass. There is no tenderness.  Musculoskeletal: He exhibits edema and tenderness.       Decreased ROM in right knee with pain and swelling. Painful to palpation varicosity over caudal, medial left calf, not warm or red  Neurological: He is alert and oriented to person, place, and time.  Skin: Skin is warm.  Psychiatric: Memory, affect and judgment normal.    Lab Results  Component Value Date   TSH 0.98 07/22/2011   Lab Results  Component Value Date   WBC 5.9 08/02/2011   HGB 15.0 08/02/2011   HCT 44.8 08/02/2011   MCV 78.0 08/02/2011   PLT 242 08/02/2011   Lab Results  Component Value Date   CREATININE 1.18 08/05/2011   BUN 19 08/05/2011   NA 139 08/05/2011   K 4.1 08/05/2011   CL 106 08/05/2011   CO2 23 08/05/2011   Lab Results  Component Value Date   ALT 17 07/22/2011   AST 17 07/22/2011   ALKPHOS 36* 07/22/2011   BILITOT 0.7 07/22/2011   Lab Results  Component Value Date   CHOL 127 07/22/2011    Lab Results  Component Value Date   HDL 36.30* 07/22/2011   Lab Results  Component Value Date   LDLCALC 74 07/22/2011   Lab Results  Component Value Date   TRIG 83.0 07/22/2011   Lab Results  Component Value Date   CHOLHDL 3 07/22/2011     Assessment & Plan  SLEEP APNEA Still sleeping poorly, waking up fatigued and without mental clarity. Is using his CPAP regularly and reports that his machine is working well.   INSOMNIA, HX OF Have allowed him a low dose of Lorazepam earlier in the evening but still has trouble falling asleep will increase Lorazepam to a full 1 mg and he can try this.  Knee pain, right Swollen, painful today. Reviewed xray with patient showing bone on bone degenerative changes. He is unable to proceed with surgical correction as indicated due to his unstable cardiac condition. He has been in and out of atrial fibrillation over past 2 years requiring multiple interventions and hospitalization. Would have to be cleared by cardiology before he could consider surgery. His knee precludes him from walking or standing routinely. Unable to climb ladders or even stairs when his knee is in pain. Will increase his pain meds, Norco to 10/325 and he will check with his neurologist about using Voltaren gel topically as well  Atrial fibrillation, converted to SR after 2nd dose of Tikosyn. Most recent intervention and hospitalization was in 2/13. Rate controlled today but still struggles with significant debility, weakness, fatigue and SOB   Superficial thrombophlebitis of left leg Pain is constant in leg and worse with prolonged standing and prolonged weight baring.   PERIPHERAL NEUROPATHY Has had EMG studies in past confirming neuropathy in all 4 extremities. Will not be able to use his hands for  any significant work or work on his feet routinely

## 2011-10-11 NOTE — Assessment & Plan Note (Signed)
Pain is constant in leg and worse with prolonged standing and prolonged weight baring.

## 2011-10-11 NOTE — Assessment & Plan Note (Signed)
Still sleeping poorly, waking up fatigued and without mental clarity. Is using his CPAP regularly and reports that his machine is working well.

## 2011-10-11 NOTE — Assessment & Plan Note (Signed)
Most recent intervention and hospitalization was in 2/13. Rate controlled today but still struggles with significant debility, weakness, fatigue and SOB

## 2011-10-11 NOTE — Assessment & Plan Note (Signed)
Have allowed him a low dose of Lorazepam earlier in the evening but still has trouble falling asleep will increase Lorazepam to a full 1 mg and he can try this.

## 2011-10-11 NOTE — Patient Instructions (Signed)
Knee Pain The knee is the complex joint between your thigh and your lower leg. It is made up of bones, tendons, ligaments, and cartilage. The bones that make up the knee are:  The femur in the thigh.   The tibia and fibula in the lower leg.   The patella or kneecap riding in the groove on the lower femur.  CAUSES  Knee pain is a common complaint with many causes. A few of these causes are:  Injury, such as:   A ruptured ligament or tendon injury.   Torn cartilage.   Medical conditions, such as:   Gout   Arthritis   Infections   Overuse, over training or overdoing a physical activity.  Knee pain can be minor or severe. Knee pain can accompany debilitating injury. Minor knee problems often respond well to self-care measures or get well on their own. More serious injuries may need medical intervention or even surgery. SYMPTOMS The knee is complex. Symptoms of knee problems can vary widely. Some of the problems are:  Pain with movement and weight bearing.   Swelling and tenderness.   Buckling of the knee.   Inability to straighten or extend your knee.   Your knee locks and you cannot straighten it.   Warmth and redness with pain and fever.   Deformity or dislocation of the kneecap.  DIAGNOSIS  Determining what is wrong may be very straight forward such as when there is an injury. It can also be challenging because of the complexity of the knee. Tests to make a diagnosis may include:  Your caregiver taking a history and doing a physical exam.   Routine X-rays can be used to rule out other problems. X-rays will not reveal a cartilage tear. Some injuries of the knee can be diagnosed by:   Arthroscopy a surgical technique by which a small video camera is inserted through tiny incisions on the sides of the knee. This procedure is used to examine and repair internal knee joint problems. Tiny instruments can be used during arthroscopy to repair the torn knee cartilage  (meniscus).   Arthrography is a radiology technique. A contrast liquid is directly injected into the knee joint. Internal structures of the knee joint then become visible on X-ray film.   An MRI scan is a non x-ray radiology procedure in which magnetic fields and a computer produce two- or three-dimensional images of the inside of the knee. Cartilage tears are often visible using an MRI scanner. MRI scans have largely replaced arthrography in diagnosing cartilage tears of the knee.   Blood work.   Examination of the fluid that helps to lubricate the knee joint (synovial fluid). This is done by taking a sample out using a needle and a syringe.  TREATMENT The treatment of knee problems depends on the cause. Some of these treatments are:  Depending on the injury, proper casting, splinting, surgery or physical therapy care will be needed.   Give yourself adequate recovery time. Do not overuse your joints. If you begin to get sore during workout routines, back off. Slow down or do fewer repetitions.   For repetitive activities such as cycling or running, maintain your strength and nutrition.   Alternate muscle groups. For example if you are a weight lifter, work the upper body on one day and the lower body the next.   Either tight or weak muscles do not give the proper support for your knee. Tight or weak muscles do not absorb the stress placed   on the knee joint. Keep the muscles surrounding the knee strong.   Take care of mechanical problems.   If you have flat feet, orthotics or special shoes may help. See your caregiver if you need help.   Arch supports, sometimes with wedges on the inner or outer aspect of the heel, can help. These can shift pressure away from the side of the knee most bothered by osteoarthritis.   A brace called an "unloader" brace also may be used to help ease the pressure on the most arthritic side of the knee.   If your caregiver has prescribed crutches, braces,  wraps or ice, use as directed. The acronym for this is PRICE. This means protection, rest, ice, compression and elevation.   Nonsteroidal anti-inflammatory drugs (NSAID's), can help relieve pain. But if taken immediately after an injury, they may actually increase swelling. Take NSAID's with food in your stomach. Stop them if you develop stomach problems. Do not take these if you have a history of ulcers, stomach pain or bleeding from the bowel. Do not take without your caregiver's approval if you have problems with fluid retention, heart failure, or kidney problems.   For ongoing knee problems, physical therapy may be helpful.   Glucosamine and chondroitin are over-the-counter dietary supplements. Both may help relieve the pain of osteoarthritis in the knee. These medicines are different from the usual anti-inflammatory drugs. Glucosamine may decrease the rate of cartilage destruction.   Injections of a corticosteroid drug into your knee joint may help reduce the symptoms of an arthritis flare-up. They may provide pain relief that lasts a few months. You may have to wait a few months between injections. The injections do have a small increased risk of infection, water retention and elevated blood sugar levels.   Hyaluronic acid injected into damaged joints may ease pain and provide lubrication. These injections may work by reducing inflammation. A series of shots may give relief for as long as 6 months.   Topical painkillers. Applying certain ointments to your skin may help relieve the pain and stiffness of osteoarthritis. Ask your pharmacist for suggestions. Many over the-counter products are approved for temporary relief of arthritis pain.   In some countries, doctors often prescribe topical NSAID's for relief of chronic conditions such as arthritis and tendinitis. A review of treatment with NSAID creams found that they worked as well as oral medications but without the serious side effects.    PREVENTION  Maintain a healthy weight. Extra pounds put more strain on your joints.   Get strong, stay limber. Weak muscles are a common cause of knee injuries. Stretching is important. Include flexibility exercises in your workouts.   Be smart about exercise. If you have osteoarthritis, chronic knee pain or recurring injuries, you may need to change the way you exercise. This does not mean you have to stop being active. If your knees ache after jogging or playing basketball, consider switching to swimming, water aerobics or other low-impact activities, at least for a few days a week. Sometimes limiting high-impact activities will provide relief.   Make sure your shoes fit well. Choose footwear that is right for your sport.   Protect your knees. Use the proper gear for knee-sensitive activities. Use kneepads when playing volleyball or laying carpet. Buckle your seat belt every time you drive. Most shattered kneecaps occur in car accidents.   Rest when you are tired.  SEEK MEDICAL CARE IF:  You have knee pain that is continual and does not   seem to be getting better.  SEEK IMMEDIATE MEDICAL CARE IF:  Your knee joint feels hot to the touch and you have a high fever. MAKE SURE YOU:   Understand these instructions.   Will watch your condition.   Will get help right away if you are not doing well or get worse.  Document Released: 03/21/2007 Document Revised: 05/13/2011 Document Reviewed: 03/21/2007 [employer] Patient Information 2012 [employer]. 

## 2011-10-11 NOTE — Assessment & Plan Note (Signed)
Has had EMG studies in past confirming neuropathy in all 4 extremities. Will not be able to use his hands for any significant work or work on his feet routinely

## 2011-10-12 ENCOUNTER — Ambulatory Visit: Payer: BC Managed Care – PPO | Admitting: Family Medicine

## 2011-10-22 ENCOUNTER — Ambulatory Visit: Payer: BC Managed Care – PPO | Admitting: Family Medicine

## 2011-11-04 ENCOUNTER — Other Ambulatory Visit (INDEPENDENT_AMBULATORY_CARE_PROVIDER_SITE_OTHER): Payer: BC Managed Care – PPO

## 2011-11-04 DIAGNOSIS — E785 Hyperlipidemia, unspecified: Secondary | ICD-10-CM

## 2011-11-04 DIAGNOSIS — I10 Essential (primary) hypertension: Secondary | ICD-10-CM

## 2011-11-04 DIAGNOSIS — G4700 Insomnia, unspecified: Secondary | ICD-10-CM

## 2011-11-04 DIAGNOSIS — E119 Type 2 diabetes mellitus without complications: Secondary | ICD-10-CM

## 2011-11-04 LAB — CBC
Hemoglobin: 14.2 g/dL (ref 13.0–17.0)
MCHC: 31.9 g/dL (ref 30.0–36.0)
RDW: 16.9 % — ABNORMAL HIGH (ref 11.5–14.6)

## 2011-11-04 LAB — HEPATIC FUNCTION PANEL
Albumin: 3.8 g/dL (ref 3.5–5.2)
Alkaline Phosphatase: 35 U/L — ABNORMAL LOW (ref 39–117)
Bilirubin, Direct: 0.1 mg/dL (ref 0.0–0.3)
Total Bilirubin: 0.5 mg/dL (ref 0.3–1.2)

## 2011-11-04 LAB — RENAL FUNCTION PANEL
BUN: 19 mg/dL (ref 6–23)
CO2: 28 mEq/L (ref 19–32)
Calcium: 8.9 mg/dL (ref 8.4–10.5)
Chloride: 106 mEq/L (ref 96–112)
GFR: 77.5 mL/min (ref >60.00)

## 2011-11-04 LAB — LIPID PANEL
HDL: 36.3 mg/dL — ABNORMAL LOW (ref >39.00)
Total CHOL/HDL Ratio: 4
Triglycerides: 89 mg/dL (ref 0.0–149.0)
VLDL: 17.8 mg/dL (ref 0.0–40.0)

## 2011-11-08 ENCOUNTER — Ambulatory Visit (INDEPENDENT_AMBULATORY_CARE_PROVIDER_SITE_OTHER): Payer: BC Managed Care – PPO | Admitting: Family Medicine

## 2011-11-08 ENCOUNTER — Encounter: Payer: Self-pay | Admitting: Family Medicine

## 2011-11-08 VITALS — BP 138/83 | HR 67 | Temp 97.8°F | Ht 71.0 in | Wt 235.0 lb

## 2011-11-08 DIAGNOSIS — I359 Nonrheumatic aortic valve disorder, unspecified: Secondary | ICD-10-CM

## 2011-11-08 DIAGNOSIS — M25561 Pain in right knee: Secondary | ICD-10-CM

## 2011-11-08 DIAGNOSIS — J449 Chronic obstructive pulmonary disease, unspecified: Secondary | ICD-10-CM

## 2011-11-08 DIAGNOSIS — F411 Generalized anxiety disorder: Secondary | ICD-10-CM

## 2011-11-08 DIAGNOSIS — D649 Anemia, unspecified: Secondary | ICD-10-CM

## 2011-11-08 DIAGNOSIS — F419 Anxiety disorder, unspecified: Secondary | ICD-10-CM

## 2011-11-08 DIAGNOSIS — E119 Type 2 diabetes mellitus without complications: Secondary | ICD-10-CM

## 2011-11-08 DIAGNOSIS — Z9189 Other specified personal risk factors, not elsewhere classified: Secondary | ICD-10-CM

## 2011-11-08 DIAGNOSIS — G4700 Insomnia, unspecified: Secondary | ICD-10-CM

## 2011-11-08 DIAGNOSIS — I10 Essential (primary) hypertension: Secondary | ICD-10-CM

## 2011-11-08 DIAGNOSIS — R0789 Other chest pain: Secondary | ICD-10-CM

## 2011-11-08 HISTORY — DX: Other chest pain: R07.89

## 2011-11-08 MED ORDER — LORAZEPAM 1 MG PO TABS: 1.0000 mg | ORAL_TABLET | Freq: Two times a day (BID) | ORAL | Status: AC | PRN

## 2011-11-08 MED ORDER — HYDROCODONE-ACETAMINOPHEN 10-325 MG PO TABS: 1.0000 | ORAL_TABLET | Freq: Four times a day (QID) | ORAL | Status: AC | PRN

## 2011-11-08 NOTE — Assessment & Plan Note (Signed)
Continues to have intermittent episodes of sob that occur randomly. More frequently with exertion.. Has appt with cardiology and pulmonology soon. Will discuss with them

## 2011-11-08 NOTE — Assessment & Plan Note (Signed)
Adequately controlled today 

## 2011-11-08 NOTE — Assessment & Plan Note (Signed)
Pain is random, never awakens him. Is reproducible with palpation along the left sternal border, likely musculoskelatal encouraged Aspercreme prn

## 2011-11-08 NOTE — Assessment & Plan Note (Signed)
Sleeps well with Temazepam 30 and a Lorazepam prior to bed but only sleeps 5 hours, may try 1/2 of Lorazepam upon arising if he has time to sleep more

## 2011-11-08 NOTE — Assessment & Plan Note (Signed)
hgba1c is 6.6, avoid simple carbs continue current meds.

## 2011-11-08 NOTE — Assessment & Plan Note (Signed)
Resolved with labs 

## 2011-11-08 NOTE — Assessment & Plan Note (Signed)
Continues to follow closely with cardiology and is seeing them soon. Stable

## 2011-11-08 NOTE — Patient Instructions (Signed)

## 2011-11-08 NOTE — Progress Notes (Signed)
Patient ID: [name], male   DOB: 07-15-1950, 61 y.o.   MRN: [id number] [name] [id number] 10/30/1950 11/08/2011      Progress Note-Follow Up  Subjective  Chief Complaint  Chief Complaint  Patient presents with  . Follow-up    one month, trouble sleeping, leg pain, short of breath    HPI  Patient is a 61 year old [place] American male who is in today for followup of multiple medical problems. He continues to struggle with ongoing fatigue. Has intermittent episodes of sudden onset shortness of breath. Sometimes they awaken him at night but often they occur with exertion during the day. With rest he generally resolve. Does not usually have chest pain or palpitations associated with these episodes. Has an appointment with both his cardiologist and his pulmonologist and his symptoms are not markedly changed from his baseline. No significant edema. No recent fevers or febrile illness. No worsening congestion GI or GU complaints. He notes with temazepam and a small dose of lorazepam an hour before that he is sleeping somewhat better. Shortly he wakes up after about 5 hours of sleep  Past Medical History  Diagnosis Date  . Hypertension   . Hyperlipidemia   . Atrial fibrillation   . UNSPECIFIED ANEMIA 01/05/2010  . TESTICULAR HYPOFUNCTION 01/05/2010  . PULMONARY FUNCTION TESTS, ABNORMAL 02/02/2010  . PERIPHERAL NEUROPATHY 01/05/2010  . OBESITY NOS 01/25/2007  . NEUROMA 01/05/2010  . INSOMNIA, HX OF 01/25/2007  . HYPERTENSION 01/25/2007  . FATIGUE, CHRONIC 11/11/2009  . ERECTILE DYSFUNCTION 01/25/2007  . DYSPNEA 12/19/2009  . CHF 12/05/2009  . CHEST PAIN-UNSPECIFIED 11/11/2009  . BENIGN PROSTATIC HYPERTROPHY, HX OF 12/05/2009  . Atrial flutter 12/05/2009  . AORTIC STENOSIS, MODERATE 12/05/2009  . Knee pain, right 12/28/2010  . Hypoxia 05/05/2011  . Heart murmur   . SLEEP APNEA 04/06/2010    uses cpap  . DM 12/05/2009  . Superficial thrombophlebitis of left leg 09/07/2011  . Atypical chest pain  11/08/2011    Past Surgical History  Procedure Date  . Bilat foot surgery removed part of 5th metatarsal to corect curvature of toes     Family History  Problem Relation Age of Onset  . Clotting disorder Mother   . Heart disease Mother     s/p MI  . Heart attack Mother   . Hypertension Mother   . Diabetes Mother   . Hyperlipidemia Mother   . Stroke Mother   . Cancer Father     ? lung  . Lung disease Father     smoker  . Diabetes Sister   . Hypertension Sister     smoker  . Leukemia Maternal Grandmother     ?  [name] Aneurysm Sister     brain  . Other Sister     clipped  . Seizures Sister     d/o w/aneurysm/ smoker    History   Social History  . Marital Status: Married    Spouse Name: N/A    Number of Children: N/A  . Years of Education: N/A   Occupational History  . Not on file.   Social History Main Topics  . Smoking status: Former Smoker -- 1.0 packs/day for 10 years    Quit date: 06/07/1980  . Smokeless tobacco: Never Used  . Alcohol Use: No  . Drug Use: No  . Sexually Active: Not Currently   Other Topics Concern  . Not on file   Social History Narrative  . No narrative on file  Current Outpatient Prescriptions on File Prior to Visit  Medication Sig Dispense Refill  . aspirin EC 81 MG tablet Take 81 mg by mouth daily.      . calcium carbonate (TUMS - DOSED IN MG ELEMENTAL CALCIUM) 500 MG chewable tablet Chew 1 tablet by mouth daily as needed. For heartburn      . dofetilide (TIKOSYN) 500 MCG capsule Take 1 capsule (500 mcg total) by mouth every 12 (twelve) hours.  60 capsule  11  . fish oil-omega-3 fatty acids 1000 MG capsule Take 1 g by mouth daily.       . furosemide (LASIX) 40 MG tablet Take 20-40 mg by mouth daily. Take one-half tablet (20mg ) once daily and 1 1/2 tablets (60mg ) if weight is over 196.      . glyBURIDE (DIABETA) 5 MG tablet Take 5 mg by mouth daily with breakfast.      . lisinopril (PRINIVIL,ZESTRIL) 40 MG tablet Take 40 mg by  mouth daily.      . metFORMIN (GLUCOPHAGE) 1000 MG tablet TAKE ONE TABLET BY MOUTH TWICE DAILY  60 tablet  3  . metoprolol (LOPRESSOR) 100 MG tablet Take 100 mg by mouth 2 (two) times daily.      . naproxen (NAPROSYN) 375 MG tablet TAKE ONE TABLET BY MOUTH TWICE DAILY WITH FOOD  60 tablet  3  . potassium chloride SA (K-DUR,KLOR-CON) 20 MEQ tablet Take 10 mEq by mouth daily.       . pravastatin (PRAVACHOL) 40 MG tablet Take 40 mg by mouth at bedtime.       . Testosterone (ANDROGEL) 20.25 MG/1.25GM (1.62%) GEL Place 1 strip onto the skin daily.      [name] warfarin (COUMADIN) 4 MG tablet Take 4 mg by mouth See admin instructions. Take 1 tablet (4mg ) on all days EXCEPT on Monday. On Monday, take one-half tablet (2mg ).      . DISCONTD: temazepam (RESTORIL) 30 MG capsule Take 1 capsule (30 mg total) by mouth at bedtime as needed for sleep.  30 capsule  1    Allergies  Allergen Reactions  . Sulfonamide Derivatives Other (See Comments)    unknown reaction    Review of Systems  Review of Systems  Constitutional: Positive for malaise/fatigue. Negative for fever.  HENT: Negative for congestion.   Eyes: Negative for discharge.  Respiratory: Positive for shortness of breath.   Cardiovascular: Negative for chest pain, palpitations and leg swelling.  Gastrointestinal: Negative for nausea, abdominal pain and diarrhea.  Genitourinary: Negative for dysuria.  Musculoskeletal: Positive for joint pain. Negative for falls.  Skin: Negative for rash.  Neurological: Negative for loss of consciousness and headaches.  Endo/Heme/Allergies: Negative for polydipsia.  Psychiatric/Behavioral: Negative for depression and suicidal ideas. The patient has insomnia. The patient is not nervous/anxious.     Objective  BP 138/83  Pulse 67  Temp(Src) 97.8 F (36.6 C) (Temporal)  Ht 5\' 11"  (1.803 m)  Wt 235 lb (106.595 kg)  BMI 32.78 kg/m2  SpO2 98%  Physical Exam  Physical Exam  Constitutional: He is oriented to  person, place, and time and well-developed, well-nourished, and in no distress. No distress.  HENT:  Head: Normocephalic and atraumatic.  Eyes: Conjunctivae are normal.  Neck: Neck supple. No thyromegaly present.  Cardiovascular: Normal rate, regular rhythm and normal heart sounds.   Pulmonary/Chest: Effort normal and breath sounds normal. No respiratory distress.  Abdominal: He exhibits no distension and no mass. There is no tenderness.  Musculoskeletal: He exhibits no  edema.  Neurological: He is alert and oriented to person, place, and time.  Skin: Skin is warm.  Psychiatric: Memory, affect and judgment normal.    Lab Results  Component Value Date   TSH 1.24 11/04/2011   Lab Results  Component Value Date   WBC 5.5 11/04/2011   HGB 14.2 11/04/2011   HCT 44.5 11/04/2011   MCV 80.1 11/04/2011   PLT 210.0 11/04/2011   Lab Results  Component Value Date   CREATININE 1.2 11/04/2011   BUN 19 11/04/2011   NA 141 11/04/2011   K 4.3 11/04/2011   CL 106 11/04/2011   CO2 28 11/04/2011   Lab Results  Component Value Date   ALT 22 11/04/2011   AST 20 11/04/2011   ALKPHOS 35* 11/04/2011   BILITOT 0.5 11/04/2011   Lab Results  Component Value Date   CHOL 132 11/04/2011   Lab Results  Component Value Date   HDL 36.30* 11/04/2011   Lab Results  Component Value Date   LDLCALC 78 11/04/2011   Lab Results  Component Value Date   TRIG 89.0 11/04/2011   Lab Results  Component Value Date   CHOLHDL 4 11/04/2011     Assessment & Plan  DM hgba1c is 6.6, avoid simple carbs continue current meds.  HYPERTENSION Adequately controlled today.  COPD (chronic obstructive pulmonary disease) Continues to have intermittent episodes of sob that occur randomly. More frequently with exertion.. Has appt with cardiology and pulmonology soon. Will discuss with them   UNSPECIFIED ANEMIA Resolved with labs  Atypical chest pain Pain is random, never awakens him. Is reproducible with palpation along the  left sternal border, likely musculoskelatal encouraged Aspercreme prn  AORTIC STENOSIS, MODERATE Continues to follow closely with cardiology and is seeing them soon. Stable  INSOMNIA, HX OF Sleeps well with Temazepam 30 and a Lorazepam prior to bed but only sleeps 5 hours, may try 1/2 of Lorazepam upon arising if he has time to sleep more

## 2011-11-24 ENCOUNTER — Other Ambulatory Visit: Payer: Self-pay | Admitting: Family Medicine

## 2011-11-29 ENCOUNTER — Other Ambulatory Visit: Payer: Self-pay | Admitting: Family Medicine

## 2011-12-10 ENCOUNTER — Ambulatory Visit: Payer: BC Managed Care – PPO | Admitting: Pulmonary Disease

## 2012-01-10 ENCOUNTER — Encounter: Payer: Self-pay | Admitting: Family Medicine

## 2012-01-10 ENCOUNTER — Ambulatory Visit (HOSPITAL_COMMUNITY)
Admission: RE | Admit: 2012-01-10 | Discharge: 2012-01-10 | Disposition: A | Discharge status: Home / Self Care | Payer: BC Managed Care – PPO | Source: Ambulatory Visit | Attending: Family Medicine | Admitting: Family Medicine

## 2012-01-10 ENCOUNTER — Ambulatory Visit: Admission: RE | Admit: 2012-01-10 | Payer: BC Managed Care – PPO | Source: Ambulatory Visit

## 2012-01-10 ENCOUNTER — Ambulatory Visit (INDEPENDENT_AMBULATORY_CARE_PROVIDER_SITE_OTHER): Payer: BC Managed Care – PPO | Admitting: Family Medicine

## 2012-01-10 VITALS — BP 131/77 | HR 59 | Temp 97.2°F | Ht 71.0 in | Wt 239.0 lb

## 2012-01-10 DIAGNOSIS — E119 Type 2 diabetes mellitus without complications: Secondary | ICD-10-CM

## 2012-01-10 DIAGNOSIS — M940 Chondrocostal junction syndrome [Tietze]: Secondary | ICD-10-CM

## 2012-01-10 DIAGNOSIS — I10 Essential (primary) hypertension: Secondary | ICD-10-CM

## 2012-01-10 DIAGNOSIS — G4700 Insomnia, unspecified: Secondary | ICD-10-CM

## 2012-01-10 DIAGNOSIS — Z9189 Other specified personal risk factors, not elsewhere classified: Secondary | ICD-10-CM

## 2012-01-10 DIAGNOSIS — M26609 Unspecified temporomandibular joint disorder, unspecified side: Secondary | ICD-10-CM | POA: Insufficient documentation

## 2012-01-10 HISTORY — DX: Unspecified temporomandibular joint disorder, unspecified side: M26.609

## 2012-01-10 MED ORDER — METOPROLOL TARTRATE 100 MG PO TABS: 100.0000 mg | ORAL_TABLET | Freq: Two times a day (BID) | ORAL | Status: DC

## 2012-01-10 MED ORDER — TEMAZEPAM 30 MG PO CAPS: 30.0000 mg | ORAL_CAPSULE | Freq: Every evening | ORAL | Status: DC | PRN

## 2012-01-10 NOTE — Patient Instructions (Addendum)
Temporomandibular Joint Pain Your exam shows that you have a problem with your temporomandibular joint (TMJ), the joint that moves when you open your mouth or chew food. TMJ problems can result from direct injuries, bite abnormalities, or tension states which cause you to grind or clench your teeth. Typical symptoms include pain around the joint, clicking, restricted movement, and headaches. The TMJ is like any other joint in the body; when it is strained, it needs rest to repair itself. To keep the joint at rest it is important that you do not open your mouth wider than the width of your index finger. If you must yawn, be sure to support your chin with your hand so your mouth does not open wide. Eat a soft diet (nothing firmer than ground beef, no raw vegetables), do not chew gum and do not talk if it causes you pain. Apply topical heat by using a warm, moist cloth placed in front of the ear for 15 to 20 minutes several times daily. Alternating heat and ice may give even more relief. Anti-inflammatory pain medicine and muscle relaxants can also be helpful. A dental orthotic or splint may be used for temporary relief. Long-term problems may require treatment for stress as well as braces or surgery. Please check with your doctor or dentist if your symptoms do not improve within one week. Document Released: 07/01/2004 Document Revised: 05/13/2011 Document Reviewed: 05/24/2005 [facility] Patient Information 2012 [place], [place].  Costochondritis Costochondritis (Tietze syndrome), or costochondral separation, is a swelling and irritation (inflammation) of the tissue (cartilage) that connects your ribs with your breastbone (sternum). It may occur on its own (spontaneously), through damage caused by an accident (trauma), or simply from coughing or minor exercise. It may take up to 6 weeks to get better and longer if you are unable to be conservative in your activities. HOME CARE INSTRUCTIONS   Avoid exhausting  physical activity. Try not to strain your ribs during normal activity. This would include any activities using chest, belly (abdominal), and side muscles, especially if heavy weights are used.   Use ice for 15 to 20 minutes per hour while awake for the first 2 days. Place the ice in a plastic bag, and place a towel between the bag of ice and your skin.   Only take over-the-counter or prescription medicines for pain, discomfort, or fever as directed by your caregiver.  SEEK IMMEDIATE MEDICAL CARE IF:   Your pain increases or you are very uncomfortable.   You have a fever.   You develop difficulty with your breathing.   You cough up blood.   You develop worse chest pains, shortness of breath, sweating, or vomiting.   You develop new, unexplained problems (symptoms).  MAKE SURE YOU:   Understand these instructions.   Will watch your condition.   Will get help right away if you are not doing well or get worse.  Document Released: 03/03/2005 Document Revised: 05/13/2011 Document Reviewed: 01/10/2008 [facility] Patient Information 2012 [place], [place].

## 2012-01-10 NOTE — Assessment & Plan Note (Signed)
pain x 4-5 days at joint, try Aspercreme and discuss with dentist if persists

## 2012-01-10 NOTE — Assessment & Plan Note (Signed)
Point tender over left sternal margin at caudal aspect. Reproducible. Try alternating heat and ice and Aspercreme bid.

## 2012-01-10 NOTE — Progress Notes (Signed)
Patient ID: [name], male   DOB: Dec 12, 1950, 61 y.o.   MRN: [id number] PETRO TALENT [id number] November 25, 1950 01/10/2012      Progress Note-Follow Up  Subjective  Chief Complaint  Chief Complaint  Patient presents with  . Follow-up    HPI  Patient is a 61 year old [place] male who is in today for followup. Overall he is doing well. He's recently seen his cardiologist and they feel he is stable regarding his cardiac conditions. He continues to have this reproducible left anterior chest wall pain. It is in a pinpoint location just to the left of the sternum and is tender with palpation in certain movements especially reaching his left arm out in front of him or above him. It goes away as quickly as it comes and is sharp in nature. There is no shortness of breath, palpitations, diaphoresis or nausea associated with these episodes and has been going on for quite some time. He notes for the last 4-5 days he's had some pain just anterior to his right ear. He can reproduce this with palpation as well. They feel like his jaw popped out of place last week and he said some discomfort since then. No ear pain or itching. No congestion, headache, sore throat, cough or shortness of breath as noted. He reports temazepam still helps him sleep 5-6 hours and it like a refill. His sugars have been adequately controlled and he does need refill on his metformin  Past Medical History  Diagnosis Date  . Hypertension   . Hyperlipidemia   . Atrial fibrillation   . UNSPECIFIED ANEMIA 01/05/2010  . TESTICULAR HYPOFUNCTION 01/05/2010  . PULMONARY FUNCTION TESTS, ABNORMAL 02/02/2010  . PERIPHERAL NEUROPATHY 01/05/2010  . OBESITY NOS 01/25/2007  . NEUROMA 01/05/2010  . INSOMNIA, HX OF 01/25/2007  . HYPERTENSION 01/25/2007  . FATIGUE, CHRONIC 11/11/2009  . ERECTILE DYSFUNCTION 01/25/2007  . DYSPNEA 12/19/2009  . CHF 12/05/2009  . CHEST PAIN-UNSPECIFIED 11/11/2009  . BENIGN PROSTATIC HYPERTROPHY, HX OF 12/05/2009  .  Atrial flutter 12/05/2009  . AORTIC STENOSIS, MODERATE 12/05/2009  . Knee pain, right 12/28/2010  . Hypoxia 05/05/2011  . Heart murmur   . SLEEP APNEA 04/06/2010    uses cpap  . DM 12/05/2009  . Superficial thrombophlebitis of left leg 09/07/2011  . Atypical chest pain 11/08/2011  . 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 Bilat foot surgery removed part of 5th metatarsal to corect curvature of toes     Family History  Problem Relation Age of Onset  . Clotting disorder Mother   . Heart disease Mother     s/p MI  . Heart attack Mother   . Hypertension Mother   . Diabetes Mother   . Hyperlipidemia Mother   . Stroke Mother   . Cancer Father     ? lung  . Lung disease Father     smoker  . Diabetes Sister   . Hypertension Sister     smoker  . Leukemia Maternal Grandmother     ?  [name] Aneurysm Sister     brain  . Other Sister     clipped  . Seizures Sister     d/o w/aneurysm/ smoker    History   Social History  . Marital Status: Married    Spouse Name: N/A    Number of Children: N/A  . Years of Education: N/A   Occupational History  . Not on file.   Social  History Main Topics  . Smoking status: Former Smoker -- 1.0 packs/day for 10 years    Quit date: 06/07/1980  . Smokeless tobacco: Never Used  . Alcohol Use: No  . Drug Use: No  . Sexually Active: Not Currently   Other Topics Concern  . Not on file   Social History Narrative  . No narrative on file    Current Outpatient Prescriptions on File Prior to Visit  Medication Sig Dispense Refill  . aspirin EC 81 MG tablet Take 81 mg by mouth daily.      . calcium carbonate (TUMS - DOSED IN MG ELEMENTAL CALCIUM) 500 MG chewable tablet Chew 1 tablet by mouth daily as needed. For heartburn      . dofetilide (TIKOSYN) 500 MCG capsule Take 1 capsule (500 mcg total) by mouth every 12 (twelve) hours.  60 capsule  11  . fish oil-omega-3 fatty acids 1000 MG capsule Take 1 g by mouth daily.       .  furosemide (LASIX) 40 MG tablet Take 20-40 mg by mouth daily. Take one-half tablet (20mg ) once daily and 1 1/2 tablets (60mg ) if weight is over 196.      . glyBURIDE (DIABETA) 5 MG tablet TAKE ONE TABLET BY MOUTH EVERY DAY  30 tablet  6  . lisinopril (PRINIVIL,ZESTRIL) 40 MG tablet Take 40 mg by mouth daily.      . metFORMIN (GLUCOPHAGE) 1000 MG tablet TAKE ONE TABLET BY MOUTH TWICE DAILY  60 tablet  3  . naproxen (NAPROSYN) 375 MG tablet TAKE ONE TABLET BY MOUTH TWICE DAILY WITH FOOD  60 tablet  3  . potassium chloride SA (K-DUR,KLOR-CON) 20 MEQ tablet Take 10 mEq by mouth daily.       . pravastatin (PRAVACHOL) 40 MG tablet Take 40 mg by mouth at bedtime.       . Testosterone (ANDROGEL) 20.25 MG/1.25GM (1.62%) GEL Place 1 strip onto the skin daily.      [name] warfarin (COUMADIN) 4 MG tablet Take 4 mg by mouth See admin instructions. Take 1 tablet (4mg ) on all days EXCEPT on Monday. On Monday, take one-half tablet (2mg ).      . DISCONTD: glyBURIDE (DIABETA) 5 MG tablet Take 5 mg by mouth daily with breakfast.      . DISCONTD: metoprolol (LOPRESSOR) 100 MG tablet Take 100 mg by mouth 2 (two) times daily.      [name] DISCONTD: temazepam (RESTORIL) 30 MG capsule TAKE ONE CAPSULE BY MOUTH AT BEDTIME AS NEEDED FOR SLEEP  30 capsule  0    Allergies  Allergen Reactions  . Sulfonamide Derivatives Other (See Comments)    unknown reaction    Review of Systems  Review of Systems  Constitutional: Negative for fever and malaise/fatigue.  HENT: Negative for congestion.        Jaw pain  Eyes: Negative for discharge.  Respiratory: Positive for shortness of breath.   Cardiovascular: Positive for chest pain. Negative for palpitations and leg swelling.  Gastrointestinal: Negative for nausea, abdominal pain and diarrhea.  Genitourinary: Negative for dysuria.  Musculoskeletal: Negative for falls.  Skin: Negative for rash.  Neurological: Negative for loss of consciousness and headaches.  Endo/Heme/Allergies:  Negative for polydipsia.  Psychiatric/Behavioral: Negative for depression and suicidal ideas. The patient is not nervous/anxious and does not have insomnia.     Objective  BP 131/77  Pulse 59  Temp 97.2 F (36.2 C) (Temporal)  Ht 5\' 11"  (1.803 m)  Wt 239 lb (108.41 kg)  BMI 33.33 kg/m2  SpO2 99%  Physical Exam  Physical Exam  Constitutional: He is oriented to person, place, and time and well-developed, well-nourished, and in no distress. No distress.  HENT:  Head: Normocephalic and atraumatic.  Eyes: Conjunctivae are normal.  Neck: Neck supple. No thyromegaly present.  Cardiovascular: Normal rate and regular rhythm.   Murmur heard. Pulmonary/Chest: Effort normal and breath sounds normal. No respiratory distress.  Abdominal: He exhibits no distension and no mass. There is no tenderness.  Musculoskeletal: He exhibits no edema.  Neurological: He is alert and oriented to person, place, and time.  Skin: Skin is warm.  Psychiatric: Memory, affect and judgment normal.    Lab Results  Component Value Date   TSH 1.24 11/04/2011   Lab Results  Component Value Date   WBC 5.5 11/04/2011   HGB 14.2 11/04/2011   HCT 44.5 11/04/2011   MCV 80.1 11/04/2011   PLT 210.0 11/04/2011   Lab Results  Component Value Date   CREATININE 1.2 11/04/2011   BUN 19 11/04/2011   NA 141 11/04/2011   K 4.3 11/04/2011   CL 106 11/04/2011   CO2 28 11/04/2011   Lab Results  Component Value Date   ALT 22 11/04/2011   AST 20 11/04/2011   ALKPHOS 35* 11/04/2011   BILITOT 0.5 11/04/2011   Lab Results  Component Value Date   CHOL 132 11/04/2011   Lab Results  Component Value Date   HDL 36.30* 11/04/2011   Lab Results  Component Value Date   LDLCALC 78 11/04/2011   Lab Results  Component Value Date   TRIG 89.0 11/04/2011   Lab Results  Component Value Date   CHOLHDL 4 11/04/2011     Assessment & Plan  HYPERTENSION Adequately controlled on current meds, has seen his cardiologist recently. No  changes  DM Patient reports recent good sugars, avoid simple carbs, no change in meds.  Costochondritis Point tender over left sternal margin at caudal aspect. Reproducible. Try alternating heat and ice and Aspercreme bid.  TMJ dysfunction pain x 4-5 days at joint, try Aspercreme and discuss with dentist if persists  INSOMNIA, HX OF Given refill today

## 2012-01-10 NOTE — Assessment & Plan Note (Signed)
Adequately controlled on current meds, has seen his cardiologist recently. No changes

## 2012-01-10 NOTE — Assessment & Plan Note (Signed)
Patient reports recent good sugars, avoid simple carbs, no change in meds.

## 2012-01-10 NOTE — Assessment & Plan Note (Signed)
Given refill today

## 2012-01-11 ENCOUNTER — Other Ambulatory Visit: Payer: Self-pay | Admitting: Family Medicine

## 2012-01-17 ENCOUNTER — Ambulatory Visit: Admission: RE | Admit: 2012-01-17 | Payer: BC Managed Care – PPO | Source: Ambulatory Visit

## 2012-01-17 ENCOUNTER — Other Ambulatory Visit: Payer: Self-pay | Admitting: Family Medicine

## 2012-01-17 ENCOUNTER — Ambulatory Visit (INDEPENDENT_AMBULATORY_CARE_PROVIDER_SITE_OTHER)
Admission: RE | Admit: 2012-01-17 | Discharge: 2012-01-17 | Disposition: A | Discharge status: Home / Self Care | Payer: BC Managed Care – PPO | Source: Ambulatory Visit | Attending: Family Medicine | Admitting: Family Medicine

## 2012-01-17 DIAGNOSIS — M940 Chondrocostal junction syndrome [Tietze]: Secondary | ICD-10-CM

## 2012-01-17 NOTE — Progress Notes (Signed)
Quick Note:  Patient Informed and voiced understanding ______ 

## 2012-01-18 ENCOUNTER — Telehealth: Payer: Self-pay | Admitting: Family Medicine

## 2012-01-18 NOTE — Telephone Encounter (Signed)
Please fax or mail copy of chest xray report to patient. His fax # is [phone].

## 2012-01-18 NOTE — Telephone Encounter (Signed)
A copy was put in the mail

## 2012-01-28 ENCOUNTER — Ambulatory Visit (INDEPENDENT_AMBULATORY_CARE_PROVIDER_SITE_OTHER): Payer: BC Managed Care – PPO | Admitting: Pulmonary Disease

## 2012-01-28 ENCOUNTER — Encounter: Payer: Self-pay | Admitting: Pulmonary Disease

## 2012-01-28 VITALS — BP 122/78 | HR 68 | Temp 98.1°F | Ht 71.0 in | Wt 242.0 lb

## 2012-01-28 DIAGNOSIS — R942 Abnormal results of pulmonary function studies: Secondary | ICD-10-CM

## 2012-01-28 DIAGNOSIS — Z9189 Other specified personal risk factors, not elsewhere classified: Secondary | ICD-10-CM

## 2012-01-28 DIAGNOSIS — G4730 Sleep apnea, unspecified: Secondary | ICD-10-CM

## 2012-01-28 NOTE — Progress Notes (Signed)
  Subjective:    Patient ID: [name], male    DOB: 11/10/1950, 61 y.o.   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 Tachycardia induced)  He has longstanding insomnia, witnessed apneas, loud snoring, excessive daytime fatigue, and non-refreshing and restless sleep. At the time of the study, he weighed 225 pounds with a height of 5 feet and 11 inches, BMI of 31, neck size of 14 inches worked 3 rd shift x 6 yrs, last worked 3-10p  PSG dec '11>> Sleep was very fragmented due to frequent arousals and awakenings, especially in the second half of the night with CPAP. He was desensitized with a standard nasal mask. Note that he took 2 tablets of temazepam of 15 mg prior to the study.  >>AHI was 42/h with lowest desatn of 86 % >>corrected by CPAP of 8 cm with a nasal mask. Poor sleep efficiency which is consistent with history of insomnia of sleep maintenance. >> started on ambien & CPAP  Download 12/28 -07/02/10 >> good compliance avg 6.5h, AHI 15/h, centrals 9/h, obstructive 4.5/h, avg pr 8 cm  Download on 10 cm 2/28-3/28/12 >> few residual events, some central apneas persist  Ambien 5 mg x 30 given 3/12   05/10/2011   PFT -> 01/2010 - TLC 77%, DLCO 56%, FVC 3.5L/72%, RAtio 77 ,FEV 1 78%  COmpliant with CPAP - on temazepam 15 mg now, prefers this to [place] - discussed long term risks  01/28/2012 Refractory afibn- flutter s/p cardioversion, ablation - On tikosyn Lot of anxiety around cardiac issues XR reviewed & discussed  -RML scarring He is taking temazepam & lorazepam for insomnia & is able to get 5h - feels otherwise his cardiac symptoms are worse. He had some residual drowsiness with ambien  Review of Systems neg for any significant sore throat, dysphagia, itching, sneezing, nasal congestion or excess/ purulent secretions, fever,  chills, sweats, unintended wt loss, pleuritic or exertional cp, hempoptysis, orthopnea pnd or change in chronic leg swelling. Also denies presyncope, palpitations, heartburn, abdominal pain, nausea, vomiting, diarrhea or change in bowel or urinary habits, dysuria,hematuria, rash, arthralgias, visual complaints, headache, numbness weakness or ataxia.     Objective:   Physical Exam  Gen. Pleasant, well-nourished, in no distress ENT - no lesions, no post nasal drip Neck: No JVD, no thyromegaly, no carotid bruits Lungs: no use of accessory muscles, no dullness to percussion, clear without rales or rhonchi  Cardiovascular: Rhythm regular, heart sounds  normal, no murmurs or gallops, no peripheral edema Musculoskeletal: No deformities, no cyanosis or clubbing         Assessment & Plan:

## 2012-01-28 NOTE — Patient Instructions (Signed)
Recommend ambien 5-10 mg for sleep  Complete your supply first & then call us for a Rx for this Stay on CPAP - this is helping

## 2012-01-30 NOTE — Assessment & Plan Note (Signed)
Appears compliant Weight loss encouraged, compliance with goal of at least 4-6 hrs every night is the expectation. Advised against medications with sedative side effects Cautioned against driving when sleepy - understanding that sleepiness will vary on a day to day basis

## 2012-01-30 NOTE — Assessment & Plan Note (Signed)
Dual benzo- not a good idea. I wonder if lorazepam is helping with some underlying anxiety issues - but I fear tachyphylaxis & habituation with benzos I have asked him to retry ambien 5  - if needed can increase to 10 - he will call us for Rx after he is done with current pills. We discussed hangover effects - & not to take benzos after MN esp if he is to drive in am

## 2012-01-30 NOTE — Assessment & Plan Note (Signed)
Needs rpt pFts at some point - but his symptoms appear to be mostly cardiac

## 2012-03-06 ENCOUNTER — Telehealth: Payer: Self-pay | Admitting: Pulmonary Disease

## 2012-03-06 MED ORDER — ZOLPIDEM TARTRATE 10 MG PO TABS: ORAL_TABLET | ORAL | Status: DC

## 2012-03-06 NOTE — Telephone Encounter (Signed)
  INSOMNIA, HX OF Oretha Milch., MD 01/30/2012 5:16 PM Signed  Dual benzo- not a good idea.  I wonder if lorazepam is helping with some underlying anxiety issues - but I fear tachyphylaxis & habituation with benzos  I have asked him to retry ambien 5 - if needed can increase to 10 - he will call us for Rx after he is done with current pills.  We discussed hangover effects - & not to take benzos after MN esp if he is to drive in am   Spoke with pt and he has finished up his current pills and now is requesting Ambien 10 mg   How many refills do you want him to have Thank you

## 2012-03-06 NOTE — Telephone Encounter (Signed)
Spoke with patient-aware that we called Rx to pharmacy.

## 2012-03-06 NOTE — Telephone Encounter (Signed)
#   30 x 3 refills

## 2012-03-07 ENCOUNTER — Other Ambulatory Visit: Payer: Self-pay | Admitting: Emergency Medicine

## 2012-03-07 DIAGNOSIS — I10 Essential (primary) hypertension: Secondary | ICD-10-CM

## 2012-03-07 DIAGNOSIS — E119 Type 2 diabetes mellitus without complications: Secondary | ICD-10-CM

## 2012-03-08 ENCOUNTER — Other Ambulatory Visit (INDEPENDENT_AMBULATORY_CARE_PROVIDER_SITE_OTHER): Payer: BC Managed Care – PPO

## 2012-03-08 DIAGNOSIS — I10 Essential (primary) hypertension: Secondary | ICD-10-CM

## 2012-03-08 DIAGNOSIS — E119 Type 2 diabetes mellitus without complications: Secondary | ICD-10-CM

## 2012-03-08 LAB — HEPATIC FUNCTION PANEL
ALT: 21 U/L (ref 0–53)
AST: 20 U/L (ref 0–37)
Albumin: 3.9 g/dL (ref 3.5–5.2)
Alkaline Phosphatase: 31 U/L — ABNORMAL LOW (ref 39–117)
Total Bilirubin: 0.8 mg/dL (ref 0.3–1.2)

## 2012-03-08 LAB — RENAL FUNCTION PANEL
BUN: 21 mg/dL (ref 6–23)
CO2: 28 mEq/L (ref 19–32)
Calcium: 9.2 mg/dL (ref 8.4–10.5)
Creatinine, Ser: 1.4 mg/dL (ref 0.4–1.5)
Glucose, Bld: 124 mg/dL — ABNORMAL HIGH (ref 70–99)

## 2012-03-08 LAB — CBC
HCT: 47.9 % (ref 39.0–52.0)
Hemoglobin: 15.2 g/dL (ref 13.0–17.0)
MCHC: 31.7 g/dL (ref 30.0–36.0)
RDW: 15.9 % — ABNORMAL HIGH (ref 11.5–14.6)
WBC: 5.3 10*3/uL (ref 4.5–10.5)

## 2012-03-08 LAB — HEMOGLOBIN A1C: Hgb A1c MFr Bld: 6.7 % — ABNORMAL HIGH (ref 4.6–6.5)

## 2012-03-08 LAB — LIPID PANEL
Total CHOL/HDL Ratio: 4
Triglycerides: 110 mg/dL (ref 0.0–149.0)

## 2012-03-10 ENCOUNTER — Ambulatory Visit (INDEPENDENT_AMBULATORY_CARE_PROVIDER_SITE_OTHER): Payer: BC Managed Care – PPO | Admitting: Family Medicine

## 2012-03-10 ENCOUNTER — Encounter: Payer: Self-pay | Admitting: Family Medicine

## 2012-03-10 VITALS — BP 129/85 | HR 60 | Ht 71.0 in | Wt 242.0 lb

## 2012-03-10 DIAGNOSIS — Z23 Encounter for immunization: Secondary | ICD-10-CM

## 2012-03-10 DIAGNOSIS — E785 Hyperlipidemia, unspecified: Secondary | ICD-10-CM

## 2012-03-10 DIAGNOSIS — M940 Chondrocostal junction syndrome [Tietze]: Secondary | ICD-10-CM

## 2012-03-10 DIAGNOSIS — I10 Essential (primary) hypertension: Secondary | ICD-10-CM

## 2012-03-10 DIAGNOSIS — E119 Type 2 diabetes mellitus without complications: Secondary | ICD-10-CM

## 2012-03-10 DIAGNOSIS — E875 Hyperkalemia: Secondary | ICD-10-CM

## 2012-03-10 LAB — RENAL FUNCTION PANEL
Albumin: 4.4 g/dL (ref 3.5–5.2)
CO2: 27 mEq/L (ref 19–32)
Calcium: 10 mg/dL (ref 8.4–10.5)
Creat: 1.33 mg/dL (ref 0.50–1.35)
Phosphorus: 3.1 mg/dL (ref 2.3–4.6)
Sodium: 142 mEq/L (ref 135–145)

## 2012-03-10 NOTE — Progress Notes (Signed)
Patient was informed of test results at today's office visit.

## 2012-03-10 NOTE — Patient Instructions (Addendum)

## 2012-03-13 ENCOUNTER — Telehealth: Payer: Self-pay

## 2012-03-13 DIAGNOSIS — E875 Hyperkalemia: Secondary | ICD-10-CM

## 2012-03-13 NOTE — Progress Notes (Signed)
Quick Note:  Patient Informed and voiced understanding.  Renal put in for [facility] labs ______

## 2012-03-13 NOTE — Assessment & Plan Note (Signed)
Still has fleeting chest pain with reaching forward in particular. Encouraged no strenuous lifting, Aspercreme prna nd report worsening symptms

## 2012-03-13 NOTE — Progress Notes (Signed)
Quick Note:  Patient Informed and voiced understanding.  Copy of results mailed to pt per pts request and future lab order put in Epic ______

## 2012-03-13 NOTE — Assessment & Plan Note (Signed)
Tolerating pravastin, avoid trans fats, consider krill oil caps

## 2012-03-13 NOTE — Telephone Encounter (Signed)
Lab order put in Epic

## 2012-03-13 NOTE — Progress Notes (Signed)
Patient ID: [name], male   DOB: [birth year], 61 y.o.   MRN: [id number] [name] [id number] 1950/07/14 03/13/2012      Progress Note-Follow Up  Subjective  Chief Complaint  Chief Complaint  Patient presents with  . Follow-up    HPI  Issue is a 61 year old American male who is in today for followup on multiple medical conditions. He reports his blood sugars are well controlled and denies polyuria or polydipsia. He does continue to have intermittent fleeting, sharp chest pains especially with extended reaching in front of him. It is not worsening and there is no associated symptoms such as palpitations shortness of breath, diaphoresis or nausea. He does have some shortness of breath but generally only with exertion and not out of proportion to what he is used to experiencing. No recent illness GI or GU complaints otherwise noted today. He continues to struggle with sleep. He notes Ambien causes excessive sedation as well as bad dreams but is willing to try again. If he does not tolerate this we may need to return to Restoril  Past Medical History  Diagnosis Date  . Hypertension   . Hyperlipidemia   . Atrial fibrillation   . UNSPECIFIED ANEMIA 01/05/2010  . TESTICULAR HYPOFUNCTION 01/05/2010  . PULMONARY FUNCTION TESTS, ABNORMAL 02/02/2010  . PERIPHERAL NEUROPATHY 01/05/2010  . OBESITY NOS 01/25/2007  . NEUROMA 01/05/2010  . INSOMNIA, HX OF 01/25/2007  . HYPERTENSION 01/25/2007  . FATIGUE, CHRONIC 11/11/2009  . ERECTILE DYSFUNCTION 01/25/2007  . DYSPNEA 12/19/2009  . CHF 12/05/2009  . CHEST PAIN-UNSPECIFIED 11/11/2009  . BENIGN PROSTATIC HYPERTROPHY, HX OF 12/05/2009  . Atrial flutter 12/05/2009  . AORTIC STENOSIS, MODERATE 12/05/2009  . Knee pain, right 12/28/2010  . Hypoxia 05/05/2011  . Heart murmur   . SLEEP APNEA 04/06/2010    uses cpap  . DM 12/05/2009  . Superficial thrombophlebitis of left leg 09/07/2011  . Atypical chest pain 11/08/2011  . 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 Bilat foot surgery removed part of 5th metatarsal to corect curvature of toes     Family History  Problem Relation Age of Onset  . Clotting disorder Mother   . Heart disease Mother     s/p MI  . Heart attack Mother   . Hypertension Mother   . Diabetes Mother   . Hyperlipidemia Mother   . Stroke Mother   . Cancer Father     ? lung  . Lung disease Father     smoker  . Diabetes Sister   . Hypertension Sister     smoker  . Leukemia Maternal Grandmother     ?  [name] Aneurysm Sister     brain  . Other Sister     clipped  . Seizures Sister     d/o w/aneurysm/ smoker    History   Social History  . Marital Status: Married    Spouse Name: N/A    Number of Children: N/A  . Years of Education: N/A   Occupational History  . Not on file.   Social History Main Topics  . Smoking status: Former Smoker -- 1.0 packs/day for 10 years    Quit date: 06/07/1980  . Smokeless tobacco: Never Used  . Alcohol Use: No  . Drug Use: No  . Sexually Active: Not Currently   Other Topics Concern  . Not on file   Social History Narrative  . No narrative on file  Current Outpatient Prescriptions on File Prior to Visit  Medication Sig Dispense Refill  . aspirin EC 81 MG tablet Take 81 mg by mouth daily.      . benazepril (LOTENSIN) 40 MG tablet Take 40 mg by mouth daily.      . calcium carbonate (TUMS - DOSED IN MG ELEMENTAL CALCIUM) 500 MG chewable tablet Chew 1 tablet by mouth daily as needed. For heartburn      . dofetilide (TIKOSYN) 500 MCG capsule Take 1 capsule (500 mcg total) by mouth every 12 (twelve) hours.  60 capsule  11  . fish oil-omega-3 fatty acids 1000 MG capsule Take 1 g by mouth daily.       . furosemide (LASIX) 40 MG tablet Take 20-40 mg by mouth daily.       [name] glyBURIDE (DIABETA) 5 MG tablet TAKE ONE TABLET BY MOUTH EVERY DAY  30 tablet  6  . HYDROcodone-acetaminophen (NORCO) 10-325 MG per tablet Take 1 tablet by mouth every 8 (eight) hours as  needed.      [name] LORazepam (ATIVAN) 1 MG tablet Take 1 mg by mouth at bedtime.      . metFORMIN (GLUCOPHAGE) 1000 MG tablet TAKE ONE TABLET BY MOUTH TWICE DAILY  60 tablet  2  . metoprolol (LOPRESSOR) 100 MG tablet Take 1 tablet (100 mg total) by mouth 2 (two) times daily.  60 tablet  3  . naproxen (NAPROSYN) 375 MG tablet       . potassium chloride SA (K-DUR,KLOR-CON) 20 MEQ tablet Take 20 mEq by mouth daily.       . pravastatin (PRAVACHOL) 40 MG tablet Take 40 mg by mouth at bedtime.       . temazepam (RESTORIL) 30 MG capsule Take 1 capsule (30 mg total) by mouth at bedtime as needed for sleep or anxiety.  30 capsule  5  . Testosterone (ANDROGEL) 20.25 MG/1.25GM (1.62%) GEL Place 4 Squirts onto the skin daily.       [name] warfarin (COUMADIN) 4 MG tablet Take 1 tablet (4mg ) on all days EXCEPT on Monday. On Monday, take one-half tablet (2mg ).      . zolpidem (AMBIEN) 10 MG tablet Take 1/2 to 1 tablet by mouth at bedtime as needed  30 tablet  5    Allergies  Allergen Reactions  . Sulfonamide Derivatives Other (See Comments)    unknown reaction    Review of Systems  Review of Systems  Constitutional: Negative for fever and malaise/fatigue.  HENT: Negative for congestion.   Eyes: Negative for discharge.  Respiratory: Negative for shortness of breath.   Cardiovascular: Positive for chest pain. Negative for palpitations and leg swelling.  Gastrointestinal: Negative for nausea, abdominal pain and diarrhea.  Genitourinary: Negative for dysuria.  Musculoskeletal: Negative for falls.  Skin: Negative for rash.  Neurological: Negative for loss of consciousness and headaches.  Endo/Heme/Allergies: Negative for polydipsia.  Psychiatric/Behavioral: Negative for depression and suicidal ideas. The patient is not nervous/anxious and does not have insomnia.     Objective  BP 129/85  Pulse 60  Ht 5\' 11"  (1.803 m)  Wt 242 lb (109.77 kg)  BMI 33.75 kg/m2  SpO2 97%  Physical Exam  Physical Exam    Constitutional: He is oriented to person, place, and time and well-developed, well-nourished, and in no distress. No distress.  HENT:  Head: Normocephalic and atraumatic.  Eyes: Conjunctivae normal are normal.  Neck: Neck supple. No thyromegaly present.  Cardiovascular: Normal rate and regular rhythm.  Murmur heard. Pulmonary/Chest: Effort normal and breath sounds normal. No respiratory distress.  Abdominal: He exhibits no distension and no mass. There is no tenderness.  Musculoskeletal: He exhibits no edema.  Neurological: He is alert and oriented to person, place, and time.  Skin: Skin is warm.  Psychiatric: Memory, affect and judgment normal.    Lab Results  Component Value Date   TSH 1.57 03/08/2012   Lab Results  Component Value Date   WBC 5.3 03/08/2012   HGB 15.2 03/08/2012   HCT 47.9 03/08/2012   MCV 81.3 03/08/2012   PLT 212.0 03/08/2012   Lab Results  Component Value Date   CREATININE 1.33 03/10/2012   BUN 20 03/10/2012   NA 142 03/10/2012   K 5.3 03/10/2012   CL 106 03/10/2012   CO2 27 03/10/2012   Lab Results  Component Value Date   ALT 21 03/08/2012   AST 20 03/08/2012   ALKPHOS 31* 03/08/2012   BILITOT 0.8 03/08/2012   Lab Results  Component Value Date   CHOL 129 03/08/2012   Lab Results  Component Value Date   HDL 31.80* 03/08/2012   Lab Results  Component Value Date   LDLCALC 75 03/08/2012   Lab Results  Component Value Date   TRIG 110.0 03/08/2012   Lab Results  Component Value Date   CHOLHDL 4 03/08/2012     Assessment & Plan  DM hgba1c well controlled, no changes to meds today, minimize simple carbs  HYPERTENSION Well  Controlled no changes to meds.  Hyperlipidemia Tolerating pravastin, avoid trans fats, consider krill oil caps  Costochondritis Still has fleeting chest pain with reaching forward in particular. Encouraged no strenuous lifting, Aspercreme prna nd report worsening symptms

## 2012-03-13 NOTE — Assessment & Plan Note (Signed)
hgba1c well controlled, no changes to meds today, minimize simple carbs

## 2012-03-13 NOTE — Assessment & Plan Note (Signed)
Well  Controlled no changes to meds.

## 2012-03-27 ENCOUNTER — Other Ambulatory Visit: Payer: Self-pay | Admitting: Family Medicine

## 2012-03-27 NOTE — Telephone Encounter (Signed)
Please advise 

## 2012-03-28 NOTE — Telephone Encounter (Signed)
Ativan called into [facility].

## 2012-04-05 ENCOUNTER — Ambulatory Visit (INDEPENDENT_AMBULATORY_CARE_PROVIDER_SITE_OTHER): Payer: BC Managed Care – PPO

## 2012-04-05 ENCOUNTER — Other Ambulatory Visit: Payer: Self-pay | Admitting: Emergency Medicine

## 2012-04-05 DIAGNOSIS — E876 Hypokalemia: Secondary | ICD-10-CM

## 2012-04-05 LAB — RENAL FUNCTION PANEL
Creatinine, Ser: 1.4 mg/dL (ref 0.4–1.5)
GFR: 68.92 mL/min (ref >60.00)
Glucose, Bld: 125 mg/dL — ABNORMAL HIGH (ref 70–99)
Phosphorus: 3.5 mg/dL (ref 2.3–4.6)
Potassium: 5.6 mEq/L — ABNORMAL HIGH (ref 3.5–5.1)
Sodium: 142 mEq/L (ref 135–145)

## 2012-04-06 NOTE — Progress Notes (Signed)
Quick Note:  Patient Informed and voiced understanding.  Patient is going to go to [place] on Monday to get K checked. Can you put the order in for him please ______

## 2012-04-10 ENCOUNTER — Other Ambulatory Visit (INDEPENDENT_AMBULATORY_CARE_PROVIDER_SITE_OTHER): Payer: BC Managed Care – PPO

## 2012-04-10 ENCOUNTER — Other Ambulatory Visit: Payer: Self-pay | Admitting: Emergency Medicine

## 2012-04-10 DIAGNOSIS — E876 Hypokalemia: Secondary | ICD-10-CM

## 2012-04-10 LAB — RENAL FUNCTION PANEL
Calcium: 9.2 mg/dL (ref 8.4–10.5)
Creatinine, Ser: 1.2 mg/dL (ref 0.4–1.5)
GFR: 76.67 mL/min (ref >60.00)
Glucose, Bld: 142 mg/dL — ABNORMAL HIGH (ref 70–99)
Phosphorus: 2.9 mg/dL (ref 2.3–4.6)
Potassium: 4.5 mEq/L (ref 3.5–5.1)
Sodium: 141 mEq/L (ref 135–145)

## 2012-04-10 NOTE — Progress Notes (Signed)
Quick Note:  Patient Informed and voiced understanding ______ 

## 2012-04-14 ENCOUNTER — Other Ambulatory Visit: Payer: Self-pay | Admitting: Family Medicine

## 2012-04-18 ENCOUNTER — Encounter: Payer: Self-pay | Admitting: Family Medicine

## 2012-04-18 ENCOUNTER — Ambulatory Visit (INDEPENDENT_AMBULATORY_CARE_PROVIDER_SITE_OTHER): Payer: Medicare Other | Admitting: Family Medicine

## 2012-04-18 ENCOUNTER — Other Ambulatory Visit: Payer: Self-pay | Admitting: Emergency Medicine

## 2012-04-18 VITALS — BP 156/95 | HR 78 | Temp 98.9°F | Ht 71.0 in | Wt 237.4 lb

## 2012-04-18 DIAGNOSIS — E875 Hyperkalemia: Secondary | ICD-10-CM | POA: Insufficient documentation

## 2012-04-18 DIAGNOSIS — R5381 Other malaise: Secondary | ICD-10-CM

## 2012-04-18 DIAGNOSIS — R609 Edema, unspecified: Secondary | ICD-10-CM | POA: Insufficient documentation

## 2012-04-18 DIAGNOSIS — I10 Essential (primary) hypertension: Secondary | ICD-10-CM

## 2012-04-18 DIAGNOSIS — E876 Hypokalemia: Secondary | ICD-10-CM

## 2012-04-18 HISTORY — DX: Edema, unspecified: R60.9

## 2012-04-18 HISTORY — DX: Hyperkalemia: E87.5

## 2012-04-18 HISTORY — DX: Other malaise: R53.81

## 2012-04-18 LAB — RENAL FUNCTION PANEL
Albumin: 4 g/dL (ref 3.5–5.2)
BUN: 17 mg/dL (ref 6–23)
Glucose, Bld: 122 mg/dL — ABNORMAL HIGH (ref 70–99)
Phosphorus: 2.6 mg/dL (ref 2.3–4.6)

## 2012-04-18 NOTE — Assessment & Plan Note (Signed)
Was running hi but normalized once we stopped the supplement, unfortunately he misunderstood and stopped the Lasix as well. Will restat Lasix and recheck renal today and again in one week

## 2012-04-18 NOTE — Assessment & Plan Note (Signed)
Improved some on recheck, no changes today

## 2012-04-18 NOTE — Patient Instructions (Addendum)
Start taking 1/2 of a 20 mg Lasix daily without taking any potassium, can increase to a whole tab as needed and only take potassium if muscle cramps or palpitations occur. Call for labs sooner than 11/18 if concerned   Edema Edema is an abnormal build-up of fluids in tissues. Because this is partly dependent on gravity (water flows to the lowest place), it is more common in the legs and thighs (lower extremities). It is also common in the looser tissues, like around the eyes. Painless swelling of the feet and ankles is common and increases as a person ages. It may affect both legs and may include the calves or even thighs. When squeezed, the fluid may move out of the affected area and may leave a dent for a few moments. CAUSES   Prolonged standing or sitting in one place for extended periods of time. Movement helps pump tissue fluid into the veins, and absence of movement prevents this, resulting in edema.  Varicose veins. The valves in the veins do not work as well as they should. This causes fluid to leak into the tissues.  Fluid and salt overload.  Injury, burn, or surgery to the leg, ankle, or foot, may damage veins and allow fluid to leak out.  Sunburn damages vessels. Leaky vessels allow fluid to go out into the sunburned tissues.  Allergies (from insect bites or stings, medications or chemicals) cause swelling by allowing vessels to become leaky.  Protein in the blood helps keep fluid in your vessels. Low protein, as in malnutrition, allows fluid to leak out.  Hormonal changes, including pregnancy and menstruation, cause fluid retention. This fluid may leak out of vessels and cause edema.  Medications that cause fluid retention. Examples are sex hormones, blood pressure medications, steroid treatment, or anti-depressants.  Some illnesses cause edema, especially heart failure, kidney disease, or liver disease.  Surgery that cuts veins or lymph nodes, such as surgery done for the  heart or for breast cancer, may result in edema. DIAGNOSIS  Your caregiver is usually easily able to determine what is causing your swelling (edema) by simply asking what is wrong (getting a history) and examining you (doing a physical). Sometimes x-rays, EKG (electrocardiogram or heart tracing), and blood work may be done to evaluate for underlying medical illness. TREATMENT  General treatment includes:  Leg elevation (or elevation of the affected body part).  Restriction of fluid intake.  Prevention of fluid overload.  Compression of the affected body part. Compression with elastic bandages or support stockings squeezes the tissues, preventing fluid from entering and forcing it back into the blood vessels.  Diuretics (also called water pills or fluid pills) pull fluid out of your body in the form of increased urination. These are effective in reducing the swelling, but can have side effects and must be used only under your caregiver's supervision. Diuretics are appropriate only for some types of edema. The specific treatment can be directed at any underlying causes discovered. Heart, liver, or kidney disease should be treated appropriately. HOME CARE INSTRUCTIONS   Elevate the legs (or affected body part) above the level of the heart, while lying down.  Avoid sitting or standing still for prolonged periods of time.  Avoid putting anything directly under the knees when lying down, and do not wear constricting clothing or garters on the upper legs.  Exercising the legs causes the fluid to work back into the veins and lymphatic channels. This may help the swelling go down.  The pressure  applied by elastic bandages or support stockings can help reduce ankle swelling.  A low-salt diet may help reduce fluid retention and decrease the ankle swelling.  Take any medications exactly as prescribed. SEEK MEDICAL CARE IF:  Your edema is not responding to recommended treatments. SEEK IMMEDIATE  MEDICAL CARE IF:   You develop shortness of breath or chest pain.  You cannot breathe when you lay down; or if, while lying down, you have to get up and go to the window to get your breath.  You are having increasing swelling without relief from treatment.  You develop a fever over 102 F (38.9 C).  You develop pain or redness in the areas that are swollen.  Tell your caregiver right away if you have gained 3 lb/1.4 kg in 1 day or 5 lb/2.3 kg in a week. MAKE SURE YOU:   Understand these instructions.  Will watch your condition.  Will get help right away if you are not doing well or get worse. Document Released: 05/24/2005 Document Revised: 11/23/2011 Document Reviewed: 01/10/2008 [facility] Patient Information 2013 [place], [place].

## 2012-04-18 NOTE — Assessment & Plan Note (Signed)
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

## 2012-04-18 NOTE — Assessment & Plan Note (Addendum)
Abdominal and lower extremity, he notes it has been up since we stopped his potassium but he stopped his Lasix as well. He will restart the Lasix as directed and increase as needed

## 2012-04-18 NOTE — Progress Notes (Signed)
Patient ID: [name], male   DOB: April 20, 1951, 61 y.o.   MRN: [id number] [name] [id number] April 18, 1951 04/18/2012      Progress Note-Follow Up  Subjective  Chief Complaint  Chief Complaint  Patient presents with  . Shortness of Breath  . water retention    HPI  Patient is a 61 year old AA male in today for followup of multiple concerns. Recently he has been struggling with hyperkalemia. We asked him to stop his potassium and unfortunately he also stopped his Lasix. He started increasing his abdominal swelling as well as his lower extremity swelling. He notes increasing shortness of breath and fatigue as well. Denies chest pain or recent illness. Continues to struggle daily with significant debility. As documented degenerative changes in the spine as well as his knees. Has peripheral vascular disease as well as peripheral neuropathy. Is extensive cardiac history including a history of atrial fibrillation and aortic stenosis. Pulmonary disease and diabetes also noted on a daily basis.  Past Medical History  Diagnosis Date  . Hypertension   . Hyperlipidemia   . Atrial fibrillation   . UNSPECIFIED ANEMIA 01/05/2010  . TESTICULAR HYPOFUNCTION 01/05/2010  . PULMONARY FUNCTION TESTS, ABNORMAL 02/02/2010  . PERIPHERAL NEUROPATHY 01/05/2010  . OBESITY NOS 01/25/2007  . NEUROMA 01/05/2010  . INSOMNIA, HX OF 01/25/2007  . HYPERTENSION 01/25/2007  . FATIGUE, CHRONIC 11/11/2009  . ERECTILE DYSFUNCTION 01/25/2007  . DYSPNEA 12/19/2009  . CHF 12/05/2009  . CHEST PAIN-UNSPECIFIED 11/11/2009  . BENIGN PROSTATIC HYPERTROPHY, HX OF 12/05/2009  . Atrial flutter 12/05/2009  . AORTIC STENOSIS, MODERATE 12/05/2009  . Knee pain, right 12/28/2010  . Hypoxia 05/05/2011  . Heart murmur   . SLEEP APNEA 04/06/2010    uses cpap  . DM 12/05/2009  . Superficial thrombophlebitis of left leg 09/07/2011  . Atypical chest pain 11/08/2011  . 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 Hyperkalemia 04/18/2012  . Edema 04/18/2012  .  Debility 04/18/2012    Past Surgical History  Procedure Date  . Bilat foot surgery removed part of 5th metatarsal to corect curvature of toes     Family History  Problem Relation Age of Onset  . Clotting disorder Mother   . Heart disease Mother     s/p MI  . Heart attack Mother   . Hypertension Mother   . Diabetes Mother   . Hyperlipidemia Mother   . Stroke Mother   . Cancer Father     ? lung  . Lung disease Father     smoker  . Diabetes Sister   . Hypertension Sister     smoker  . Leukemia Maternal Grandmother     ?  [name] Aneurysm Sister     brain  . Other Sister     clipped  . Seizures Sister     d/o w/aneurysm/ smoker    History   Social History  . Marital Status: Married    Spouse Name: N/A    Number of Children: N/A  . Years of Education: N/A   Occupational History  . Not on file.   Social History Main Topics  . Smoking status: Former Smoker -- 1.0 packs/day for 10 years    Quit date: 06/07/1980  . Smokeless tobacco: Never Used  . Alcohol Use: No  . Drug Use: No  . Sexually Active: Not Currently   Other Topics Concern  . Not on file   Social History Narrative  . No narrative on file    Current Outpatient Prescriptions  on File Prior to Visit  Medication Sig Dispense Refill  . aspirin EC 81 MG tablet Take 81 mg by mouth daily.      . benazepril (LOTENSIN) 40 MG tablet Take 40 mg by mouth daily.      . calcium carbonate (TUMS - DOSED IN MG ELEMENTAL CALCIUM) 500 MG chewable tablet Chew 1 tablet by mouth daily as needed. For heartburn      . dofetilide (TIKOSYN) 500 MCG capsule Take 1 capsule (500 mcg total) by mouth every 12 (twelve) hours.  60 capsule  11  . fish oil-omega-3 fatty acids 1000 MG capsule Take 1 g by mouth daily.       . furosemide (LASIX) 40 MG tablet Take 20-40 mg by mouth daily.       [name] glyBURIDE (DIABETA) 5 MG tablet TAKE ONE TABLET BY MOUTH EVERY DAY  30 tablet  6  . HYDROcodone-acetaminophen (NORCO) 10-325 MG per tablet Take 1  tablet by mouth every 8 (eight) hours as needed.      [name] LORazepam (ATIVAN) 1 MG tablet TAKE ONE TABLET BY MOUTH TWICE DAILY AS NEEDED FOR ANXIETY (INSOMNIA)  60 tablet  0  . metFORMIN (GLUCOPHAGE) 1000 MG tablet TAKE ONE TABLET BY MOUTH TWICE DAILY  60 tablet  1  . metoprolol (LOPRESSOR) 100 MG tablet Take 1 tablet (100 mg total) by mouth 2 (two) times daily.  60 tablet  3  . naproxen (NAPROSYN) 375 MG tablet       . pravastatin (PRAVACHOL) 40 MG tablet Take 40 mg by mouth at bedtime.       . temazepam (RESTORIL) 30 MG capsule Take 1 capsule (30 mg total) by mouth at bedtime as needed for sleep or anxiety.  30 capsule  5  . Testosterone (ANDROGEL) 20.25 MG/1.25GM (1.62%) GEL Place 4 Squirts onto the skin daily.       [name] warfarin (COUMADIN) 4 MG tablet Take 1 tablet (4mg ) on all days EXCEPT on Monday and Friday. On Monday and Friday, take one-half tablet (2mg ).      . zolpidem (AMBIEN) 10 MG tablet Take 1/2 to 1 tablet by mouth at bedtime as needed  30 tablet  5  . potassium chloride SA (K-DUR,KLOR-CON) 20 MEQ tablet Take 20 mEq by mouth daily.         Allergies  Allergen Reactions  . Sulfonamide Derivatives Other (See Comments)    unknown reaction    Review of Systems  Review of Systems  Constitutional: Positive for malaise/fatigue. Negative for fever.  HENT: Positive for neck pain. Negative for congestion.   Eyes: Negative for discharge.  Respiratory: Positive for shortness of breath.   Cardiovascular: Positive for leg swelling. Negative for chest pain and palpitations.  Gastrointestinal: Positive for abdominal pain. Negative for nausea and diarrhea.  Genitourinary: Negative for dysuria.  Musculoskeletal: Positive for joint pain. Negative for falls.  Skin: Negative for rash.  Neurological: Negative for loss of consciousness and headaches.  Endo/Heme/Allergies: Negative for polydipsia.  Psychiatric/Behavioral: Negative for depression and suicidal ideas. The patient is not  nervous/anxious and does not have insomnia.     Objective  BP 156/95  Pulse 78  Temp 98.9 F (37.2 C) (Temporal)  Ht 5\' 11"  (1.803 m)  Wt 237 lb 6.4 oz (107.684 kg)  BMI 33.11 kg/m2  SpO2 97%  Physical Exam  Physical Exam  Constitutional: He is oriented to person, place, and time and well-developed, well-nourished, and in no distress. No distress.  HENT:  Head: Normocephalic and atraumatic.  Eyes: Conjunctivae normal are normal.  Neck: Neck supple. No thyromegaly present.  Cardiovascular: Normal rate, regular rhythm and normal heart sounds.   No murmur heard. Pulmonary/Chest: Effort normal and breath sounds normal. No respiratory distress.  Abdominal: Soft. Bowel sounds are normal. He exhibits distension. He exhibits no mass. There is no tenderness. There is no rebound and no guarding.  Musculoskeletal: He exhibits edema.       1+ b/l LE  Neurological: He is alert and oriented to person, place, and time.  Skin: Skin is warm.  Psychiatric: Memory, affect and judgment normal.    Lab Results  Component Value Date   TSH 1.57 03/08/2012   Lab Results  Component Value Date   WBC 5.3 03/08/2012   HGB 15.2 03/08/2012   HCT 47.9 03/08/2012   MCV 81.3 03/08/2012   PLT 212.0 03/08/2012   Lab Results  Component Value Date   CREATININE 1.2 04/10/2012   BUN 16 04/10/2012   NA 141 04/10/2012   K 4.5 04/10/2012   CL 107 04/10/2012   CO2 28 04/10/2012   Lab Results  Component Value Date   ALT 21 03/08/2012   AST 20 03/08/2012   ALKPHOS 31* 03/08/2012   BILITOT 0.8 03/08/2012   Lab Results  Component Value Date   CHOL 129 03/08/2012   Lab Results  Component Value Date   HDL 31.80* 03/08/2012   Lab Results  Component Value Date   LDLCALC 75 03/08/2012   Lab Results  Component Value Date   TRIG 110.0 03/08/2012   Lab Results  Component Value Date   CHOLHDL 4 03/08/2012     Assessment & Plan  HYPERTENSION Improved some on recheck, no changes today  Edema Abdominal and  lower extremity, he notes it has been up since we stopped his potassium but he stopped his Lasix as well. He will restart the Lasix as directed and increase as needed  Hyperkalemia Was running hi but normalized once we stopped the supplement, unfortunately he misunderstood and stopped the Lasix as well. 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

## 2012-04-19 NOTE — Progress Notes (Signed)
Quick Note:  Patient Informed and voiced understanding ______ 

## 2012-04-25 ENCOUNTER — Other Ambulatory Visit: Payer: Medicare Other

## 2012-04-25 ENCOUNTER — Other Ambulatory Visit (INDEPENDENT_AMBULATORY_CARE_PROVIDER_SITE_OTHER): Payer: Medicare Other

## 2012-04-25 DIAGNOSIS — E876 Hypokalemia: Secondary | ICD-10-CM | POA: Diagnosis not present

## 2012-04-25 LAB — RENAL FUNCTION PANEL
Albumin: 3.9 g/dL (ref 3.5–5.2)
BUN: 14 mg/dL (ref 6–23)
Calcium: 9.2 mg/dL (ref 8.4–10.5)
Creatinine, Ser: 1.3 mg/dL (ref 0.4–1.5)
Glucose, Bld: 186 mg/dL — ABNORMAL HIGH (ref 70–99)

## 2012-04-25 NOTE — Progress Notes (Signed)
Quick Note:  Patient Informed and voiced understanding ______ 

## 2012-05-03 DIAGNOSIS — I4891 Unspecified atrial fibrillation: Secondary | ICD-10-CM | POA: Diagnosis not present

## 2012-05-03 DIAGNOSIS — Z7901 Long term (current) use of anticoagulants: Secondary | ICD-10-CM | POA: Diagnosis not present

## 2012-06-09 ENCOUNTER — Ambulatory Visit: Payer: BC Managed Care – PPO | Admitting: Family Medicine

## 2012-06-13 ENCOUNTER — Other Ambulatory Visit (HOSPITAL_COMMUNITY): Payer: Self-pay | Admitting: Cardiovascular Disease

## 2012-06-13 DIAGNOSIS — I4891 Unspecified atrial fibrillation: Secondary | ICD-10-CM | POA: Diagnosis not present

## 2012-06-13 DIAGNOSIS — I359 Nonrheumatic aortic valve disorder, unspecified: Secondary | ICD-10-CM

## 2012-06-13 DIAGNOSIS — R9431 Abnormal electrocardiogram [ECG] [EKG]: Secondary | ICD-10-CM

## 2012-06-13 DIAGNOSIS — Z7901 Long term (current) use of anticoagulants: Secondary | ICD-10-CM | POA: Diagnosis not present

## 2012-06-13 DIAGNOSIS — Z951 Presence of aortocoronary bypass graft: Secondary | ICD-10-CM

## 2012-06-20 DIAGNOSIS — R9431 Abnormal electrocardiogram [ECG] [EKG]: Secondary | ICD-10-CM | POA: Diagnosis not present

## 2012-06-20 DIAGNOSIS — I10 Essential (primary) hypertension: Secondary | ICD-10-CM | POA: Diagnosis not present

## 2012-06-20 DIAGNOSIS — I4891 Unspecified atrial fibrillation: Secondary | ICD-10-CM | POA: Diagnosis not present

## 2012-06-20 DIAGNOSIS — I509 Heart failure, unspecified: Secondary | ICD-10-CM | POA: Diagnosis not present

## 2012-06-21 ENCOUNTER — Other Ambulatory Visit: Payer: Self-pay | Admitting: Family Medicine

## 2012-06-22 ENCOUNTER — Other Ambulatory Visit (INDEPENDENT_AMBULATORY_CARE_PROVIDER_SITE_OTHER): Payer: Medicare Other

## 2012-06-22 ENCOUNTER — Other Ambulatory Visit: Payer: Self-pay | Admitting: Emergency Medicine

## 2012-06-22 DIAGNOSIS — E876 Hypokalemia: Secondary | ICD-10-CM

## 2012-06-22 DIAGNOSIS — E119 Type 2 diabetes mellitus without complications: Secondary | ICD-10-CM

## 2012-06-22 DIAGNOSIS — Z0000 Encounter for general adult medical examination without abnormal findings: Secondary | ICD-10-CM

## 2012-06-22 DIAGNOSIS — I10 Essential (primary) hypertension: Secondary | ICD-10-CM

## 2012-06-22 LAB — LIPID PANEL
Cholesterol: 138 mg/dL (ref 0–200)
HDL: 32.9 mg/dL — ABNORMAL LOW (ref >39.00)
LDL Cholesterol: 86 mg/dL (ref 0–99)
Triglycerides: 95 mg/dL (ref 0.0–149.0)
VLDL: 19 mg/dL (ref 0.0–40.0)

## 2012-06-22 LAB — RENAL FUNCTION PANEL
Chloride: 106 mEq/L (ref 96–112)
Glucose, Bld: 139 mg/dL — ABNORMAL HIGH (ref 70–99)
Phosphorus: 3.2 mg/dL (ref 2.3–4.6)
Potassium: 4.7 mEq/L (ref 3.5–5.1)
Sodium: 141 mEq/L (ref 135–145)

## 2012-06-22 LAB — CBC
RBC: 5.92 Mil/uL — ABNORMAL HIGH (ref 4.22–5.81)
RDW: 15.8 % — ABNORMAL HIGH (ref 11.5–14.6)
WBC: 4.8 10*3/uL (ref 4.5–10.5)

## 2012-06-22 LAB — HEMOGLOBIN A1C: Hgb A1c MFr Bld: 6.8 % — ABNORMAL HIGH (ref 4.6–6.5)

## 2012-06-23 ENCOUNTER — Ambulatory Visit (HOSPITAL_COMMUNITY)
Admission: RE | Admit: 2012-06-23 | Discharge: 2012-06-23 | Disposition: A | Discharge status: Home / Self Care | Payer: Medicare Other | Source: Ambulatory Visit | Attending: Cardiovascular Disease | Admitting: Cardiovascular Disease

## 2012-06-23 DIAGNOSIS — R0609 Other forms of dyspnea: Secondary | ICD-10-CM | POA: Diagnosis not present

## 2012-06-23 DIAGNOSIS — R9431 Abnormal electrocardiogram [ECG] [EKG]: Secondary | ICD-10-CM | POA: Diagnosis not present

## 2012-06-23 DIAGNOSIS — R5383 Other fatigue: Secondary | ICD-10-CM | POA: Insufficient documentation

## 2012-06-23 DIAGNOSIS — R5381 Other malaise: Secondary | ICD-10-CM | POA: Insufficient documentation

## 2012-06-23 DIAGNOSIS — R0989 Other specified symptoms and signs involving the circulatory and respiratory systems: Secondary | ICD-10-CM | POA: Insufficient documentation

## 2012-06-23 DIAGNOSIS — R002 Palpitations: Secondary | ICD-10-CM | POA: Insufficient documentation

## 2012-06-23 DIAGNOSIS — R0602 Shortness of breath: Secondary | ICD-10-CM | POA: Insufficient documentation

## 2012-06-23 MED ORDER — TECHNETIUM TC 99M SESTAMIBI GENERIC - CARDIOLITE
30.4000 | Freq: Once | INTRAVENOUS | Status: AC | PRN
  Administered 2012-06-23: 30 via INTRAVENOUS

## 2012-06-23 MED ORDER — REGADENOSON 0.4 MG/5ML IV SOLN
0.4000 mg | Freq: Once | INTRAVENOUS | Status: AC
  Administered 2012-06-23: 0.4 mg via INTRAVENOUS

## 2012-06-23 MED ORDER — TECHNETIUM TC 99M SESTAMIBI GENERIC - CARDIOLITE
11.0000 | Freq: Once | INTRAVENOUS | Status: AC | PRN
  Administered 2012-06-23: 11 via INTRAVENOUS

## 2012-06-23 NOTE — Procedures (Addendum)
[facility] [address] [place] [address] [place] [address] [phone]  Cardiology Nuclear Med Study  [name] is a 62 y.o. male     MRN : [id number]     DOB: [birth year]  Procedure Date: 06/23/2012  Nuclear Med Background Indication for Stress Test:  Evaluation for Ischemia and PTCA Patency History:  2011 PTCA Cardiac Risk Factors: Hypertension, Lipids, NIDDM, Obesity and Smoker  Symptoms:  Abnormal EKG,Palpitations,SOB,DOE,Fatigue   Nuclear Pre-Procedure Caffeine/Decaff Intake:  None NPO After: 6:00am   IV Site: R Antecubital  IV 0.9% NS with Angio Cath:  22g  Chest Size (in):  48 IV Started by: Bonnita Levan, RN  Height: 5\' 11"  (1.803 m)  Cup Size: n/a  BMI:  Body mass index is 32.50 kg/(m^2). Weight:  233 lb (105.688 kg)   Tech Comments:  N/A    Nuclear Med Study 1 or 2 day study: 1 day  Stress Test Type:  Lexiscan  Order Authorizing Provider:  Thurmon Fair, MD   Resting Radionuclide: Technetium 16m Sestamibi  Resting Radionuclide Dose: 11.0 mCi   Stress Radionuclide:  Technetium 11m Sestamibi  Stress Radionuclide Dose: 30.4 mCi           Stress Protocol Rest HR: 68 Stress HR:88  Rest BP: 145/87 Stress BP: 149/95  Exercise Time (min): n/a METS: n/a          Dose of Adenosine (mg):  n/a Dose of Lexiscan: 0.4 mg  Dose of Atropine (mg): n/a Dose of Dobutamine: n/a mcg/kg/min (at max HR)  Stress Test Technologist: Ernestene Mention, CCT Nuclear Technologist: Gonzella Lex, CNMT   Rest Procedure:  Myocardial perfusion imaging was performed at rest 45 minutes following the intravenous administration of Technetium 73m Sestamibi. Stress Procedure:  The patient received IV Lexiscan 0.4 mg over 15-seconds.  Technetium 69m Sestamibi injected at 30-seconds.  There were no significant changes with Lexiscan.  Quantitative spect images were obtained after a 45 minute delay.  Transient Ischemic Dilatation (Normal <1.22):   1.10 Lung/Heart Ratio (Normal <0.45):  0.31 QGS EDV:  159 ml QGS ESV:  83 ml LV Ejection Fraction: 48%  Signed by Cayucos Cellar. Vear Clock, CNMT  PHYSICIAN INTERPRETATION  Rest ECG: NSR with non-specific ST-T wave changes - poor anterior R wave progression, ~Q wave in aVL  Stress ECG: No significant change from baseline ECG and No significant ST segment change suggestive of ischemia.   QPS Raw Data Images:  Mild diaphragmatic attenuation; normal left ventricular size. (Borderline dilated LV, TID 1.10) Stress Images:  Normal homogeneous uptake in all areas of the myocardium. Rest Images:  There is decreased uptake in the inferior wall. Subtraction (SDS):  There is no evidence of scar or ischemia.  Impression Exercise Capacity:  Lexiscan with no exercise. BP Response:  Hypertensive blood pressure response. Clinical Symptoms:  No significant symptoms noted. ECG Impression:  No significant ST segment change suggestive of ischemia.  No significant ECG changes with Lexiscan. LV Wall Motion:  Normal Wall Motion with Mildly reduced EF of ~45-50%.  Comparison with Prior Nuclear Study: No previous nuclear study performed  Overall Impression:  Low risk stress nuclear study. No scintigraphic evidence of ischemia or infarction.  Marykay Lex, MD  06/23/2012 2:34 PM

## 2012-07-04 DIAGNOSIS — I4891 Unspecified atrial fibrillation: Secondary | ICD-10-CM | POA: Diagnosis not present

## 2012-07-04 DIAGNOSIS — Z7901 Long term (current) use of anticoagulants: Secondary | ICD-10-CM | POA: Diagnosis not present

## 2012-07-10 ENCOUNTER — Ambulatory Visit (INDEPENDENT_AMBULATORY_CARE_PROVIDER_SITE_OTHER): Payer: Medicare Other | Admitting: Family Medicine

## 2012-07-10 ENCOUNTER — Encounter: Payer: Self-pay | Admitting: Family Medicine

## 2012-07-10 ENCOUNTER — Ambulatory Visit: Payer: Self-pay | Admitting: Family Medicine

## 2012-07-10 VITALS — BP 124/74 | HR 76 | Temp 97.6°F | Ht 71.0 in | Wt 232.8 lb

## 2012-07-10 DIAGNOSIS — D649 Anemia, unspecified: Secondary | ICD-10-CM

## 2012-07-10 DIAGNOSIS — G4700 Insomnia, unspecified: Secondary | ICD-10-CM

## 2012-07-10 DIAGNOSIS — E119 Type 2 diabetes mellitus without complications: Secondary | ICD-10-CM

## 2012-07-10 DIAGNOSIS — M109 Gout, unspecified: Secondary | ICD-10-CM

## 2012-07-10 DIAGNOSIS — M79675 Pain in left toe(s): Secondary | ICD-10-CM

## 2012-07-10 DIAGNOSIS — M79609 Pain in unspecified limb: Secondary | ICD-10-CM | POA: Diagnosis not present

## 2012-07-10 DIAGNOSIS — I10 Essential (primary) hypertension: Secondary | ICD-10-CM

## 2012-07-10 DIAGNOSIS — E785 Hyperlipidemia, unspecified: Secondary | ICD-10-CM

## 2012-07-10 DIAGNOSIS — M79674 Pain in right toe(s): Secondary | ICD-10-CM

## 2012-07-10 HISTORY — DX: Gout, unspecified: M10.9

## 2012-07-10 HISTORY — DX: Pain in right toe(s): M79.674

## 2012-07-10 MED ORDER — GLYBURIDE 5 MG PO TABS: 5.0000 mg | ORAL_TABLET | Freq: Every day | ORAL | Status: DC

## 2012-07-10 MED ORDER — LORAZEPAM 1 MG PO TABS: 1.0000 mg | ORAL_TABLET | Freq: Two times a day (BID) | ORAL | Status: DC | PRN

## 2012-07-10 MED ORDER — METFORMIN HCL 1000 MG PO TABS: 1000.0000 mg | ORAL_TABLET | Freq: Two times a day (BID) | ORAL | Status: DC

## 2012-07-10 MED ORDER — TEMAZEPAM 30 MG PO CAPS: 30.0000 mg | ORAL_CAPSULE | Freq: Every evening | ORAL | Status: DC | PRN

## 2012-07-10 NOTE — Assessment & Plan Note (Signed)
Tolerating Pravastain with good results.

## 2012-07-10 NOTE — Assessment & Plan Note (Signed)
Resolved. No changes

## 2012-07-10 NOTE — Assessment & Plan Note (Signed)
hgba1c 6.8, avoid simple carbs and continue current meds

## 2012-07-10 NOTE — Assessment & Plan Note (Signed)
Check uric acid level, increase hydration. Essentially resolved with Naproxen use.

## 2012-07-10 NOTE — Patient Instructions (Addendum)
Labs prior to next visit, lipid, renal, cbc, tsh, hepatic, hgba1c, uric acid  Ganglion A ganglion is a swelling under the skin that is filled with a thick, jelly-like substance. It is a synovial cyst. This is caused by a break (rupture) of the joint lining from the joint space. A ganglion often occurs near an area of repeated minor trauma (damage caused by an accident). Trauma may also be a repetitive movement at work or in a sport. TREATMENT  It often goes away without treatment. It may reappear later. Sometimes a ganglion may need to be surgically removed. Often they are drained and injected with a steroid. Sometimes they respond to:  Rest.  Splinting. HOME CARE INSTRUCTIONS   Your caregiver will decide the best way of treating your ganglion. Do not try to break the ganglion yourself by pressing on it, poking it with a needle, or hitting it with a heavy object.  Use medications as directed. SEEK MEDICAL CARE IF:   The ganglion becomes larger or more painful.  You have increased redness or swelling.  You have weakness or numbness in your hand or wrist. MAKE SURE YOU:   Understand these instructions.  Will watch your condition.  Will get help right away if you are not doing well or get worse. Document Released: 05/21/2000 Document Revised: 08/16/2011 Document Reviewed: 07/18/2007 [facility] Patient Information 2013 [place], [place].   Gout Gout is an inflammatory condition (arthritis) caused by a buildup of uric acid crystals in the joints. Uric acid is a chemical that is normally present in the blood. Under some circumstances, uric acid can form into crystals in your joints. This causes joint redness, soreness, and swelling (inflammation). Repeat attacks are common. Over time, uric acid crystals can form into masses (tophi) near a joint, causing disfigurement. Gout is treatable and often preventable. CAUSES  The disease begins with elevated levels of uric acid in the blood. Uric  acid is produced by your body when it breaks down a naturally found substance called purines. This also happens when you eat certain foods such as meats and fish. Causes of an elevated uric acid level include:  Being passed down from parent to child (heredity).  Diseases that cause increased uric acid production (obesity, psoriasis, some cancers).  Excessive alcohol use.  Diet, especially diets rich in meat and seafood.  Medicines, including certain cancer-fighting drugs (chemotherapy), diuretics, and aspirin.  Chronic kidney disease. The kidneys are no longer able to remove uric acid well.  Problems with metabolism. Conditions strongly associated with gout include:  Obesity.  High blood pressure.  High cholesterol.  Diabetes. Not everyone with elevated uric acid levels gets gout. It is not understood why some people get gout and others do not. Surgery, joint injury, and eating too much of certain foods are some of the factors that can lead to gout. SYMPTOMS   An attack of gout comes on quickly. It causes intense pain with redness, swelling, and warmth in a joint.  Fever can occur.  Often, only one joint is involved. Certain joints are more commonly involved:  Base of the big toe.  Knee.  Ankle.  Wrist.  Finger. Without treatment, an attack usually goes away in a few days to weeks. Between attacks, you usually will not have symptoms, which is different from many other forms of arthritis. DIAGNOSIS  Your caregiver will suspect gout based on your symptoms and exam. Removal of fluid from the joint (arthrocentesis) is done to check for uric acid crystals.  Your caregiver will give you a medicine that numbs the area (local anesthetic) and use a needle to remove joint fluid for exam. Gout is confirmed when uric acid crystals are seen in joint fluid, using a special microscope. Sometimes, blood, urine, and X-ray tests are also used. TREATMENT  There are 2 phases to gout  treatment: treating the sudden onset (acute) attack and preventing attacks (prophylaxis). Treatment of an Acute Attack  Medicines are used. These include anti-inflammatory medicines or steroid medicines.  An injection of steroid medicine into the affected joint is sometimes necessary.  The painful joint is rested. Movement can worsen the arthritis.  You may use warm or cold treatments on painful joints, depending which works best for you.  Discuss the use of coffee, vitamin C, or cherries with your caregiver. These may be helpful treatment options. Treatment to Prevent Attacks After the acute attack subsides, your caregiver may advise prophylactic medicine. These medicines either help your kidneys eliminate uric acid from your body or decrease your uric acid production. You may need to stay on these medicines for a very long time. The early phase of treatment with prophylactic medicine can be associated with an increase in acute gout attacks. For this reason, during the first few months of treatment, your caregiver may also advise you to take medicines usually used for acute gout treatment. Be sure you understand your caregiver's directions. You should also discuss dietary treatment with your caregiver. Certain foods such as meats and fish can increase uric acid levels. Other foods such as dairy can decrease levels. Your caregiver can give you a list of foods to avoid. HOME CARE INSTRUCTIONS   Do not take aspirin to relieve pain. This raises uric acid levels.  Only take over-the-counter or prescription medicines for pain, discomfort, or fever as directed by your caregiver.  Rest the joint as much as possible. When in bed, keep sheets and blankets off painful areas.  Keep the affected joint raised (elevated).  Use crutches if the painful joint is in your leg.  Drink enough water and fluids to keep your urine clear or pale yellow. This helps your body get rid of uric acid. Do not drink  alcoholic beverages. They slow the passage of uric acid.  Follow your caregiver's dietary instructions. Pay careful attention to the amount of protein you eat. Your daily diet should emphasize fruits, vegetables, whole grains, and fat-free or low-fat milk products.  Maintain a healthy body weight. SEEK MEDICAL CARE IF:   You have an oral temperature above 102 F (38.9 C).  You develop diarrhea, vomiting, or any side effects from medicines.  You do not feel better in 24 hours, or you are getting worse. SEEK IMMEDIATE MEDICAL CARE IF:   Your joint becomes suddenly more tender and you have:  Chills.  An oral temperature above 102 F (38.9 C), not controlled by medicine. MAKE SURE YOU:   Understand these instructions.  Will watch your condition.  Will get help right away if you are not doing well or get worse. Document Released: 05/21/2000 Document Revised: 08/16/2011 Document Reviewed: 09/01/2009 [facility] Patient Information 2013 [place], [place].

## 2012-07-10 NOTE — Assessment & Plan Note (Signed)
Well controlled no changes 

## 2012-07-10 NOTE — Progress Notes (Signed)
Patient ID: [name], male   DOB: 04/15/1951, 62 y.o.   MRN: [id number] PATRIC BUCKHALTER [id number] May 01, 1951 07/10/2012      Progress Note-Follow Up  Subjective  Chief Complaint  Chief Complaint  Patient presents with  . Follow-up    3 month    HPI Patient is a 62 year old African American male who is here today in followup. No recent illness. He did have an episode of significant right toe pain even light touch was painful. Took naproxen and is essentially resolved. He believes he had a flare. Denies any trauma or changes in diet. No chest pain, palpitations, shortness of breath, GI or GU complaints noted today. Sugars well controlled  Past Medical History  Diagnosis Date  . Hypertension   . Hyperlipidemia   . Atrial fibrillation   . UNSPECIFIED ANEMIA 01/05/2010  . TESTICULAR HYPOFUNCTION 01/05/2010  . PULMONARY FUNCTION TESTS, ABNORMAL 02/02/2010  . PERIPHERAL NEUROPATHY 01/05/2010  . OBESITY NOS 01/25/2007  . NEUROMA 01/05/2010  . INSOMNIA, HX OF 01/25/2007  . HYPERTENSION 01/25/2007  . FATIGUE, CHRONIC 11/11/2009  . ERECTILE DYSFUNCTION 01/25/2007  . DYSPNEA 12/19/2009  . CHF 12/05/2009  . CHEST PAIN-UNSPECIFIED 11/11/2009  . BENIGN PROSTATIC HYPERTROPHY, HX OF 12/05/2009  . Atrial flutter 12/05/2009  . AORTIC STENOSIS, MODERATE 12/05/2009  . Knee pain, right 12/28/2010  . Hypoxia 05/05/2011  . Heart murmur   . SLEEP APNEA 04/06/2010    uses cpap  . DM 12/05/2009  . Superficial thrombophlebitis of left leg 09/07/2011  . Atypical chest pain 11/08/2011  . 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 Hyperkalemia 04/18/2012  . Edema 04/18/2012  . Debility 04/18/2012  . Toe pain, right 07/10/2012    Past Surgical History  Procedure Date  . Bilat foot surgery removed part of 5th metatarsal to corect curvature of toes     Family History  Problem Relation Age of Onset  . Clotting disorder Mother   . Heart disease Mother     s/p MI  . Heart attack Mother   . Hypertension Mother   . Diabetes Mother    . Hyperlipidemia Mother   . Stroke Mother   . Cancer Father     ? lung  . Lung disease Father     smoker  . Diabetes Sister   . Hypertension Sister     smoker  . Leukemia Maternal Grandmother     ?  [name] Aneurysm Sister     brain  . Other Sister     clipped  . Seizures Sister     d/o w/aneurysm/ smoker    History   Social History  . Marital Status: Married    Spouse Name: N/A    Number of Children: N/A  . Years of Education: N/A   Occupational History  . Not on file.   Social History Main Topics  . Smoking status: Former Smoker -- 1.0 packs/day for 10 years    Quit date: 06/07/1980  . Smokeless tobacco: Never Used  . Alcohol Use: No  . Drug Use: No  . Sexually Active: Not Currently   Other Topics Concern  . Not on file   Social History Narrative  . No narrative on file    Current Outpatient Prescriptions on File Prior to Visit  Medication Sig Dispense Refill  . aspirin EC 81 MG tablet Take 81 mg by mouth daily.      . benazepril (LOTENSIN) 40 MG tablet Take 40 mg by mouth daily.      [name]  calcium carbonate (TUMS - DOSED IN MG ELEMENTAL CALCIUM) 500 MG chewable tablet Chew 1 tablet by mouth daily as needed. For heartburn      . dofetilide (TIKOSYN) 500 MCG capsule Take 1 capsule (500 mcg total) by mouth every 12 (twelve) hours.  60 capsule  11  . fish oil-omega-3 fatty acids 1000 MG capsule Take 1 g by mouth daily.       . furosemide (LASIX) 40 MG tablet Take 20-40 mg by mouth daily.       [name] glyBURIDE (DIABETA) 5 MG tablet Take 1 tablet (5 mg total) by mouth daily with breakfast.  30 tablet  6  . HYDROcodone-acetaminophen (NORCO) 10-325 MG per tablet Take 1 tablet by mouth every 8 (eight) hours as needed.      . metFORMIN (GLUCOPHAGE) 1000 MG tablet Take 1 tablet (1,000 mg total) by mouth 2 (two) times daily with a meal.  60 tablet  5  . metoprolol (LOPRESSOR) 100 MG tablet Take 1 tablet (100 mg total) by mouth 2 (two) times daily.  60 tablet  3  . naproxen  (NAPROSYN) 375 MG tablet       . pravastatin (PRAVACHOL) 40 MG tablet Take 40 mg by mouth at bedtime.       . temazepam (RESTORIL) 30 MG capsule Take 1 capsule (30 mg total) by mouth at bedtime as needed for sleep or anxiety.  30 capsule  5  . Testosterone (ANDROGEL) 20.25 MG/1.25GM (1.62%) GEL Place 4 Squirts onto the skin daily.       [name] warfarin (COUMADIN) 4 MG tablet Take 1 tablet (4mg ) on all days EXCEPT on Monday and Friday. On Monday and Friday, take one-half tablet (2mg ).        Allergies  Allergen Reactions  . Sulfonamide Derivatives Other (See Comments)    unknown reaction    Review of Systems  Review of Systems  Constitutional: Positive for malaise/fatigue. Negative for fever.  HENT: Negative for congestion.   Eyes: Negative for discharge.  Respiratory: Negative for shortness of breath.   Cardiovascular: Negative for chest pain, palpitations and leg swelling.  Gastrointestinal: Negative for nausea, abdominal pain and diarrhea.  Genitourinary: Negative for dysuria.  Musculoskeletal: Negative for falls.  Skin: Negative for rash.  Neurological: Negative for loss of consciousness and headaches.  Endo/Heme/Allergies: Negative for polydipsia.  Psychiatric/Behavioral: Negative for depression and suicidal ideas. The patient is not nervous/anxious and does not have insomnia.     Objective  BP 124/74  Pulse 76  Temp 97.6 F (36.4 C) (Oral)  Ht 5\' 11"  (1.803 m)  Wt 232 lb 12.8 oz (105.597 kg)  BMI 32.47 kg/m2  SpO2 97%  Physical Exam  Physical Exam  Constitutional: He is oriented to person, place, and time and well-developed, well-nourished, and in no distress. No distress.  HENT:  Head: Normocephalic and atraumatic.  Eyes: Conjunctivae normal are normal.  Neck: Neck supple. No thyromegaly present.  Cardiovascular: Normal rate.   Murmur heard.      3/6 sys murmur  Pulmonary/Chest: Effort normal and breath sounds normal. No respiratory distress.  Abdominal: He  exhibits no distension and no mass. There is no tenderness.  Musculoskeletal: He exhibits no edema.  Neurological: He is alert and oriented to person, place, and time.  Skin: Skin is warm.  Psychiatric: Memory, affect and judgment normal.    Lab Results  Component Value Date   TSH 0.92 06/22/2012   Lab Results  Component Value Date   WBC 4.8  06/22/2012   HGB 15.2 06/22/2012   HCT 46.0 06/22/2012   MCV 77.7* 06/22/2012   PLT 198.0 06/22/2012   Lab Results  Component Value Date   CREATININE 1.2 06/22/2012   BUN 21 06/22/2012   NA 141 06/22/2012   K 4.7 06/22/2012   CL 106 06/22/2012   CO2 28 06/22/2012   Lab Results  Component Value Date   ALT 21 03/08/2012   AST 20 03/08/2012   ALKPHOS 31* 03/08/2012   BILITOT 0.8 03/08/2012   Lab Results  Component Value Date   CHOL 138 06/22/2012   Lab Results  Component Value Date   HDL 32.90* 06/22/2012   Lab Results  Component Value Date   LDLCALC 86 06/22/2012   Lab Results  Component Value Date   TRIG 95.0 06/22/2012   Lab Results  Component Value Date   CHOLHDL 4 06/22/2012     Assessment & Plan  Toe pain, right Check uric acid level, increase hydration. Essentially resolved with Naproxen use.  UNSPECIFIED ANEMIA Resolved. No changes  HYPERTENSION Well controlled no changes.  Hyperlipidemia Tolerating Pravastain with good results.   DM hgba1c 6.8, avoid simple carbs and continue current meds

## 2012-07-12 MED ORDER — ALLOPURINOL 100 MG PO TABS: 100.0000 mg | ORAL_TABLET | Freq: Every day | ORAL | Status: DC

## 2012-07-12 NOTE — Addendum Note (Signed)
Addended by: Court Joy on: 07/12/2012 11:49 AM   Modules accepted: Orders

## 2012-07-12 NOTE — Progress Notes (Signed)
Quick Note:  Patient Informed and voiced understanding. Pt states he picked up Allopurinol earlier  ______

## 2012-07-24 DIAGNOSIS — I509 Heart failure, unspecified: Secondary | ICD-10-CM | POA: Diagnosis not present

## 2012-07-24 DIAGNOSIS — Z7901 Long term (current) use of anticoagulants: Secondary | ICD-10-CM | POA: Diagnosis not present

## 2012-07-24 DIAGNOSIS — I4891 Unspecified atrial fibrillation: Secondary | ICD-10-CM | POA: Diagnosis not present

## 2012-07-24 DIAGNOSIS — G4733 Obstructive sleep apnea (adult) (pediatric): Secondary | ICD-10-CM | POA: Diagnosis not present

## 2012-08-03 ENCOUNTER — Encounter: Payer: Self-pay | Admitting: Pulmonary Disease

## 2012-08-03 ENCOUNTER — Ambulatory Visit (INDEPENDENT_AMBULATORY_CARE_PROVIDER_SITE_OTHER): Payer: Medicare Other | Admitting: Pulmonary Disease

## 2012-08-03 VITALS — BP 132/74 | HR 63 | Temp 97.1°F | Ht 71.0 in | Wt 231.0 lb

## 2012-08-03 DIAGNOSIS — G4730 Sleep apnea, unspecified: Secondary | ICD-10-CM

## 2012-08-03 DIAGNOSIS — Z9189 Other specified personal risk factors, not elsewhere classified: Secondary | ICD-10-CM

## 2012-08-03 NOTE — Assessment & Plan Note (Signed)
Weight loss encouraged, compliance with goal of at least 4-6 hrs every night is the expectation. Advised against medications with sedative side effects Cautioned against driving when sleepy - understanding that sleepiness will vary on a day to day basis  

## 2012-08-03 NOTE — Assessment & Plan Note (Signed)
Long standing, on ambien + lorazepam We discussed dangers of long term benzo use Likely has underlying anxiety - that ativan is helping with We will refill ambien every - next fu in 1 yr

## 2012-08-03 NOTE — Patient Instructions (Addendum)
Stay on CPAP & same meds

## 2012-08-03 NOTE — Progress Notes (Signed)
  Subjective:    Patient ID: Marisa Severin, male    DOB: April 19, 1951, 62 y.o.   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 Tachycardia induced)  He has longstanding insomnia, witnessed apneas, loud snoring, excessive daytime fatigue, and non-refreshing and restless sleep. At the time of the study, he weighed 225 pounds with a height of 5 feet and 11 inches, BMI of 31, neck size of 14 inches worked 3 rd shift x 6 yrs, last worked 3-10p  PSG dec '11>> Sleep was very fragmented due to frequent arousals and awakenings, especially in the second half of the night with CPAP. He was desensitized with a standard nasal mask. Note that he took 2 tablets of temazepam of 15 mg prior to the study. >>AHI was 42/h with lowest desatn of 86 % >>corrected by CPAP of 8 cm with a nasal mask. Poor sleep efficiency which is consistent with history of insomnia of sleep maintenance. >> started on ambien & CPAP  Download 12/28 -07/02/10 >> good compliance avg 6.5h, AHI 15/h, centrals 9/h, obstructive 4.5/h, avg pr 8 cm  Download on 10 cm 2/28-3/28/12 >> few residual events, some central apneas persist  Ambien 5 mg x 30 given 3/12   05/10/2011  PFT -> 01/2010 - TLC 77%, DLCO 56%, FVC 3.5L/72%, RAtio 77 ,FEV 1 78%      08/03/2012 Staying in rhythm  On tikosyn  Lot of anxiety around cardiac issues wears CPAP everynight x 5 hrs a night. Pt states he does not feel rested all the time.  Takes ambien 10 & 1/2 tab ativan every night >> ambien 10 x 3 refills given last visit 8/13 He had some residual drowsiness with ambien, no problems with ativan has good and bad days with his breathing. Has cough after he eats. no wheezing, chest tx   Review of Systems neg for any significant sore throat, dysphagia, itching, sneezing, nasal congestion or excess/ purulent secretions,  fever, chills, sweats, unintended wt loss, pleuritic or exertional cp, hempoptysis, orthopnea pnd or change in chronic leg swelling. Also denies presyncope, palpitations, heartburn, abdominal pain, nausea, vomiting, diarrhea or change in bowel or urinary habits, dysuria,hematuria, rash, arthralgias, visual complaints, headache, numbness weakness or ataxia.     Objective:   Physical Exam   Gen. Pleasant, well-nourished, in no distress ENT - no lesions, no post nasal drip Neck: No JVD, no thyromegaly, no carotid bruits Lungs: no use of accessory muscles, no dullness to percussion, clear without rales or rhonchi  Cardiovascular: Rhythm regular, heart sounds  normal, no murmurs or gallops, no peripheral edema Musculoskeletal: No deformities, no cyanosis or clubbing         Assessment & Plan:

## 2012-08-15 ENCOUNTER — Other Ambulatory Visit: Payer: Self-pay | Admitting: Family Medicine

## 2012-08-15 NOTE — Telephone Encounter (Signed)
Please advise refill?  If ok fax to [phone]

## 2012-08-15 NOTE — Telephone Encounter (Signed)
RX faxed

## 2012-08-21 DIAGNOSIS — I4891 Unspecified atrial fibrillation: Secondary | ICD-10-CM | POA: Diagnosis not present

## 2012-08-21 DIAGNOSIS — Z7901 Long term (current) use of anticoagulants: Secondary | ICD-10-CM | POA: Diagnosis not present

## 2012-08-22 ENCOUNTER — Ambulatory Visit: Payer: Self-pay | Admitting: Cardiovascular Disease

## 2012-08-22 DIAGNOSIS — Z7901 Long term (current) use of anticoagulants: Secondary | ICD-10-CM | POA: Insufficient documentation

## 2012-09-13 DIAGNOSIS — R972 Elevated prostate specific antigen [PSA]: Secondary | ICD-10-CM | POA: Diagnosis not present

## 2012-09-13 DIAGNOSIS — E291 Testicular hypofunction: Secondary | ICD-10-CM | POA: Diagnosis not present

## 2012-09-19 DIAGNOSIS — I4891 Unspecified atrial fibrillation: Secondary | ICD-10-CM | POA: Diagnosis not present

## 2012-09-19 DIAGNOSIS — Z7901 Long term (current) use of anticoagulants: Secondary | ICD-10-CM | POA: Diagnosis not present

## 2012-09-20 DIAGNOSIS — R972 Elevated prostate specific antigen [PSA]: Secondary | ICD-10-CM | POA: Diagnosis not present

## 2012-09-20 DIAGNOSIS — E291 Testicular hypofunction: Secondary | ICD-10-CM | POA: Diagnosis not present

## 2012-09-20 DIAGNOSIS — N529 Male erectile dysfunction, unspecified: Secondary | ICD-10-CM | POA: Diagnosis not present

## 2012-09-20 DIAGNOSIS — R6882 Decreased libido: Secondary | ICD-10-CM | POA: Diagnosis not present

## 2012-10-07 ENCOUNTER — Encounter: Payer: Self-pay | Admitting: Cardiovascular Disease

## 2012-10-16 DIAGNOSIS — Z7901 Long term (current) use of anticoagulants: Secondary | ICD-10-CM | POA: Diagnosis not present

## 2012-10-16 DIAGNOSIS — I4891 Unspecified atrial fibrillation: Secondary | ICD-10-CM | POA: Diagnosis not present

## 2012-10-31 ENCOUNTER — Ambulatory Visit (INDEPENDENT_AMBULATORY_CARE_PROVIDER_SITE_OTHER): Payer: Medicare Other | Admitting: Family Medicine

## 2012-10-31 ENCOUNTER — Encounter: Payer: Self-pay | Admitting: Family Medicine

## 2012-10-31 VITALS — BP 130/80 | HR 74 | Temp 97.5°F | Ht 71.0 in | Wt 231.1 lb

## 2012-10-31 DIAGNOSIS — Z9189 Other specified personal risk factors, not elsewhere classified: Secondary | ICD-10-CM | POA: Diagnosis not present

## 2012-10-31 DIAGNOSIS — E119 Type 2 diabetes mellitus without complications: Secondary | ICD-10-CM | POA: Diagnosis not present

## 2012-10-31 DIAGNOSIS — I10 Essential (primary) hypertension: Secondary | ICD-10-CM

## 2012-10-31 DIAGNOSIS — M109 Gout, unspecified: Secondary | ICD-10-CM | POA: Diagnosis not present

## 2012-10-31 LAB — HEMOGLOBIN A1C
Hgb A1c MFr Bld: 5.9 % — ABNORMAL HIGH (ref <5.7)
Mean Plasma Glucose: 123 mg/dL — ABNORMAL HIGH (ref <117)

## 2012-10-31 LAB — CBC
HCT: 42.9 % (ref 39.0–52.0)
Hemoglobin: 14.3 g/dL (ref 13.0–17.0)
MCV: 77.2 fL — ABNORMAL LOW (ref 78.0–100.0)
RBC: 5.56 MIL/uL (ref 4.22–5.81)
WBC: 3.6 10*3/uL — ABNORMAL LOW (ref 4.0–10.5)

## 2012-10-31 LAB — RENAL FUNCTION PANEL
Albumin: 4.2 g/dL (ref 3.5–5.2)
CO2: 27 mEq/L (ref 19–32)
Calcium: 9.3 mg/dL (ref 8.4–10.5)
Phosphorus: 2.8 mg/dL (ref 2.3–4.6)
Sodium: 141 mEq/L (ref 135–145)

## 2012-10-31 LAB — LIPID PANEL
LDL Cholesterol: 70 mg/dL (ref 0–99)
Total CHOL/HDL Ratio: 3.9 Ratio

## 2012-10-31 LAB — HEPATIC FUNCTION PANEL
AST: 17 U/L (ref 0–37)
Bilirubin, Direct: 0.2 mg/dL (ref 0.0–0.3)
Total Bilirubin: 0.9 mg/dL (ref 0.3–1.2)

## 2012-10-31 LAB — TSH: TSH: 0.905 u[IU]/mL (ref 0.350–4.500)

## 2012-10-31 MED ORDER — COLCHICINE 0.6 MG PO TABS: ORAL_TABLET | ORAL | Status: DC

## 2012-10-31 NOTE — Patient Instructions (Addendum)
Hold the Pravastatin  Add Melatonin 2-5 mg 1 hour to bed   Gout Gout is an inflammatory condition (arthritis) caused by a buildup of uric acid crystals in the joints. Uric acid is a chemical that is normally present in the blood. Under some circumstances, uric acid can form into crystals in your joints. This causes joint redness, soreness, and swelling (inflammation). Repeat attacks are common. Over time, uric acid crystals can form into masses (tophi) near a joint, causing disfigurement. Gout is treatable and often preventable. CAUSES  The disease begins with elevated levels of uric acid in the blood. Uric acid is produced by your body when it breaks down a naturally found substance called purines. This also happens when you eat certain foods such as meats and fish. Causes of an elevated uric acid level include:  Being passed down from parent to child (heredity).  Diseases that cause increased uric acid production (obesity, psoriasis, some cancers).  Excessive alcohol use.  Diet, especially diets rich in meat and seafood.  Medicines, including certain cancer-fighting drugs (chemotherapy), diuretics, and aspirin.  Chronic kidney disease. The kidneys are no longer able to remove uric acid well.  Problems with metabolism. Conditions strongly associated with gout include:  Obesity.  High blood pressure.  High cholesterol.  Diabetes. Not everyone with elevated uric acid levels gets gout. It is not understood why some people get gout and others do not. Surgery, joint injury, and eating too much of certain foods are some of the factors that can lead to gout. SYMPTOMS   An attack of gout comes on quickly. It causes intense pain with redness, swelling, and warmth in a joint.  Fever can occur.  Often, only one joint is involved. Certain joints are more commonly involved:  Base of the big toe.  Knee.  Ankle.  Wrist.  Finger. Without treatment, an attack usually goes away in a  few days to weeks. Between attacks, you usually will not have symptoms, which is different from many other forms of arthritis. DIAGNOSIS  Your caregiver will suspect gout based on your symptoms and exam. Removal of fluid from the joint (arthrocentesis) is done to check for uric acid crystals. Your caregiver will give you a medicine that numbs the area (local anesthetic) and use a needle to remove joint fluid for exam. Gout is confirmed when uric acid crystals are seen in joint fluid, using a special microscope. Sometimes, blood, urine, and X-ray tests are also used. TREATMENT  There are 2 phases to gout treatment: treating the sudden onset (acute) attack and preventing attacks (prophylaxis). Treatment of an Acute Attack  Medicines are used. These include anti-inflammatory medicines or steroid medicines.  An injection of steroid medicine into the affected joint is sometimes necessary.  The painful joint is rested. Movement can worsen the arthritis.  You may use warm or cold treatments on painful joints, depending which works best for you.  Discuss the use of coffee, vitamin C, or cherries with your caregiver. These may be helpful treatment options. Treatment to Prevent Attacks After the acute attack subsides, your caregiver may advise prophylactic medicine. These medicines either help your kidneys eliminate uric acid from your body or decrease your uric acid production. You may need to stay on these medicines for a very long time. The early phase of treatment with prophylactic medicine can be associated with an increase in acute gout attacks. For this reason, during the first few months of treatment, your caregiver may also advise you to take  medicines usually used for acute gout treatment. Be sure you understand your caregiver's directions. You should also discuss dietary treatment with your caregiver. Certain foods such as meats and fish can increase uric acid levels. Other foods such as dairy  can decrease levels. Your caregiver can give you a list of foods to avoid. HOME CARE INSTRUCTIONS   Do not take aspirin to relieve pain. This raises uric acid levels.  Only take over-the-counter or prescription medicines for pain, discomfort, or fever as directed by your caregiver.  Rest the joint as much as possible. When in bed, keep sheets and blankets off painful areas.  Keep the affected joint raised (elevated).  Use crutches if the painful joint is in your leg.  Drink enough water and fluids to keep your urine clear or pale yellow. This helps your body get rid of uric acid. Do not drink alcoholic beverages. They slow the passage of uric acid.  Follow your caregiver's dietary instructions. Pay careful attention to the amount of protein you eat. Your daily diet should emphasize fruits, vegetables, whole grains, and fat-free or low-fat milk products.  Maintain a healthy body weight. SEEK MEDICAL CARE IF:   You have an oral temperature above 102 F (38.9 C).  You develop diarrhea, vomiting, or any side effects from medicines.  You do not feel better in 24 hours, or you are getting worse. SEEK IMMEDIATE MEDICAL CARE IF:   Your joint becomes suddenly more tender and you have:  Chills.  An oral temperature above 102 F (38.9 C), not controlled by medicine. MAKE SURE YOU:   Understand these instructions.  Will watch your condition.  Will get help right away if you are not doing well or get worse. Document Released: 05/21/2000 Document Revised: 08/16/2011 Document Reviewed: 09/01/2009 [facility] Patient Information 2014 [place], [place].

## 2012-10-31 NOTE — Progress Notes (Signed)
Patient ID: [name], male   DOB: [birth year], 62 y.o.   MRN: [id number] [name] [id number] [birth year] 10/31/2012      Progress Note-Follow Up  Subjective  Chief Complaint  Chief Complaint  Patient presents with  . Gout    left big toe  . Insomnia    and breathing issues    HPI  Presents at complaining of gout. He's had several days now significant pain his right great toe. Has been red hot and swollen. Denies any injury. Denies any fevers and chills. Is frustrated with his persistent insomnia reports it is worsened recently. Has persistent fatigue but no chest pain or palpitations recently. Reports blood sugars up in well treated lately. He is taking his medications as prescribed.  Past Medical History  Diagnosis Date  . Hypertension   . Hyperlipidemia   . Atrial fibrillation   . UNSPECIFIED ANEMIA 01/05/2010  . TESTICULAR HYPOFUNCTION 01/05/2010  . PULMONARY FUNCTION TESTS, ABNORMAL 02/02/2010  . PERIPHERAL NEUROPATHY 01/05/2010  . OBESITY NOS 01/25/2007  . NEUROMA 01/05/2010  . INSOMNIA, HX OF 01/25/2007  . HYPERTENSION 01/25/2007  . FATIGUE, CHRONIC 11/11/2009  . ERECTILE DYSFUNCTION 01/25/2007  . DYSPNEA 12/19/2009  . CHF 12/05/2009  . CHEST PAIN-UNSPECIFIED 11/11/2009  . BENIGN PROSTATIC HYPERTROPHY, HX OF 12/05/2009  . Atrial flutter 12/05/2009  . AORTIC STENOSIS, MODERATE 12/05/2009  . Knee pain, right 12/28/2010  . Hypoxia 05/05/2011  . Heart murmur   . SLEEP APNEA 04/06/2010    uses cpap  . DM 12/05/2009  . Superficial thrombophlebitis of left leg 09/07/2011  . Atypical chest pain 11/08/2011  . 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 Hyperkalemia 04/18/2012  . Edema 04/18/2012  . Debility 04/18/2012  . Toe pain, right 07/10/2012    Past Surgical History  Procedure Laterality Date  . Bilat foot surgery removed part of 5th metatarsal to corect curvature of toes      Family History  Problem Relation Age of Onset  . Clotting disorder Mother   . Heart disease Mother     s/p MI  .  Heart attack Mother   . Hypertension Mother   . Diabetes Mother   . Hyperlipidemia Mother   . Stroke Mother   . Cancer Father     ? lung  . Lung disease Father     smoker  . Diabetes Sister   . Hypertension Sister     smoker  . Leukemia Maternal Grandmother     ?  [name] Aneurysm Sister     brain  . Other Sister     clipped  . Seizures Sister     d/o w/aneurysm/ smoker    History   Social History  . Marital Status: Married    Spouse Name: N/A    Number of Children: N/A  . Years of Education: N/A   Occupational History  . Not on file.   Social History Main Topics  . Smoking status: Former Smoker -- 1.00 packs/day for 10 years    Quit date: 06/07/1980  . Smokeless tobacco: Never Used  . Alcohol Use: No  . Drug Use: No  . Sexually Active: Not Currently   Other Topics Concern  . Not on file   Social History Narrative  . No narrative on file    Current Outpatient Prescriptions on File Prior to Visit  Medication Sig Dispense Refill  . allopurinol (ZYLOPRIM) 100 MG tablet Take 1 tablet (100 mg total) by mouth daily.  30 tablet  2  .  aspirin EC 81 MG tablet Take 81 mg by mouth daily.      . benazepril (LOTENSIN) 40 MG tablet Take 40 mg by mouth daily.      . calcium carbonate (TUMS - DOSED IN MG ELEMENTAL CALCIUM) 500 MG chewable tablet Chew 1 tablet by mouth daily as needed. For heartburn      . dofetilide (TIKOSYN) 500 MCG capsule Take 1 capsule (500 mcg total) by mouth every 12 (twelve) hours.  60 capsule  11  . fish oil-omega-3 fatty acids 1000 MG capsule Take 1 g by mouth daily.       . furosemide (LASIX) 40 MG tablet Take 20-40 mg by mouth daily.       [name] glyBURIDE (DIABETA) 5 MG tablet Take 1 tablet (5 mg total) by mouth daily with breakfast.  30 tablet  6  . HYDROcodone-acetaminophen (NORCO) 10-325 MG per tablet TAKE ONE TABLET BY MOUTH EVERY 6 HOURS AS NEEDED FOR PAIN  90 tablet  0  . LORazepam (ATIVAN) 1 MG tablet Take 1 tablet (1 mg total) by mouth 2 (two)  times daily as needed for anxiety (insomnia).  60 tablet  2  . metFORMIN (GLUCOPHAGE) 1000 MG tablet Take 1 tablet (1,000 mg total) by mouth 2 (two) times daily with a meal.  60 tablet  5  . metoprolol (LOPRESSOR) 100 MG tablet Take 1 tablet (100 mg total) by mouth 2 (two) times daily.  60 tablet  3  . Multiple Vitamins-Minerals (MULTIVITAMIN PO) Take 1 tablet by mouth daily.      . naproxen (NAPROSYN) 375 MG tablet       . pravastatin (PRAVACHOL) 40 MG tablet Take 40 mg by mouth at bedtime.       . Testosterone (ANDROGEL) 20.25 MG/1.25GM (1.62%) GEL Place 4 Squirts onto the skin daily.       [name] warfarin (COUMADIN) 4 MG tablet Take 1 tablet (4mg ) on all days EXCEPT on Monday and Friday. On Monday and Friday, take one-half tablet (2mg ).      . zolpidem (AMBIEN) 10 MG tablet Take 10 mg by mouth at bedtime as needed for sleep.       No current facility-administered medications on file prior to visit.    Allergies  Allergen Reactions  . Sulfonamide Derivatives Other (See Comments)    unknown reaction  . Zolpidem     Excessive, prolonged sedation    Review of Systems  Review of Systems  Constitutional: Negative for fever and malaise/fatigue.  HENT: Negative for congestion.   Eyes: Negative for discharge.  Respiratory: Negative for shortness of breath.   Cardiovascular: Negative for chest pain, palpitations and leg swelling.  Gastrointestinal: Negative for nausea, abdominal pain and diarrhea.  Genitourinary: Negative for dysuria.  Musculoskeletal: Positive for joint pain. Negative for falls.       Left foot great toe pain  Skin: Negative for rash.  Neurological: Negative for loss of consciousness and headaches.  Endo/Heme/Allergies: Negative for polydipsia.  Psychiatric/Behavioral: Negative for depression and suicidal ideas. The patient has insomnia. The patient is not nervous/anxious.     Objective  BP 130/80  Pulse 74  Temp(Src) 97.5 F (36.4 C) (Oral)  Ht 5\' 11"  (1.803 m)   Wt 231 lb 1.9 oz (104.835 kg)  BMI 32.25 kg/m2  SpO2 96%  Physical Exam  Physical Exam  Constitutional: He is oriented to person, place, and time and well-developed, well-nourished, and in no distress. No distress.  HENT:  Head: Normocephalic and atraumatic.  Eyes: Conjunctivae are normal.  Neck: Neck supple. No thyromegaly present.  Cardiovascular: Normal rate, regular rhythm and normal heart sounds.   No murmur heard. Pulmonary/Chest: Effort normal and breath sounds normal. No respiratory distress.  Abdominal: Soft. Bowel sounds are normal. He exhibits no distension and no mass. There is no tenderness.  Musculoskeletal: He exhibits edema and tenderness.  1st MTP joint left foot  Neurological: He is alert and oriented to person, place, and time.  Skin: Skin is warm.  Psychiatric: Memory, affect and judgment normal.    Lab Results  Component Value Date   TSH 0.92 06/22/2012   Lab Results  Component Value Date   WBC 4.8 06/22/2012   HGB 15.2 06/22/2012   HCT 46.0 06/22/2012   MCV 77.7* 06/22/2012   PLT 198.0 06/22/2012   Lab Results  Component Value Date   CREATININE 1.2 06/22/2012   BUN 21 06/22/2012   NA 141 06/22/2012   K 4.7 06/22/2012   CL 106 06/22/2012   CO2 28 06/22/2012   Lab Results  Component Value Date   ALT 21 03/08/2012   AST 20 03/08/2012   ALKPHOS 31* 03/08/2012   BILITOT 0.8 03/08/2012   Lab Results  Component Value Date   CHOL 138 06/22/2012   Lab Results  Component Value Date   HDL 32.90* 06/22/2012   Lab Results  Component Value Date   LDLCALC 86 06/22/2012   Lab Results  Component Value Date   TRIG 95.0 06/22/2012   Lab Results  Component Value Date   CHOLHDL 4 06/22/2012     Assessment & Plan  Gout Uric acid still hi, increase Allopurinol to 300 mg daily, increae hydration, NSAIDs sparingly.  INSOMNIA, HX OF Advised cannot add another sedative hypnotic, may add Melatonin to see if he gets any benefit.  HYPERTENSION Well  controlled  DM Well controlled, no changes today

## 2012-11-01 NOTE — Progress Notes (Signed)
Quick Note:  Patient Informed and voiced understanding ______ 

## 2012-11-02 ENCOUNTER — Encounter: Payer: Self-pay | Admitting: Family Medicine

## 2012-11-02 NOTE — Assessment & Plan Note (Signed)
Advised cannot add another sedative hypnotic, may add Melatonin to see if he gets any benefit.

## 2012-11-02 NOTE — Assessment & Plan Note (Signed)
Uric acid still hi, increase Allopurinol to 300 mg daily, increae hydration, NSAIDs sparingly.

## 2012-11-02 NOTE — Assessment & Plan Note (Signed)
Well controlled 

## 2012-11-02 NOTE — Assessment & Plan Note (Signed)
Well controlled, no changes today 

## 2012-11-06 ENCOUNTER — Telehealth: Payer: Self-pay

## 2012-11-06 MED ORDER — ALLOPURINOL 300 MG PO TABS: 300.0000 mg | ORAL_TABLET | Freq: Every day | ORAL | Status: DC

## 2012-11-06 NOTE — Telephone Encounter (Signed)
Pt left a message that he needs the Allopurinol 300 mg sent to pharmacy

## 2012-11-13 ENCOUNTER — Ambulatory Visit (INDEPENDENT_AMBULATORY_CARE_PROVIDER_SITE_OTHER): Payer: Medicare Other | Admitting: Pharmacist Clinician (PhC)/ Clinical Pharmacy Specialist

## 2012-11-13 VITALS — BP 126/72 | HR 72

## 2012-11-13 DIAGNOSIS — Z7901 Long term (current) use of anticoagulants: Secondary | ICD-10-CM | POA: Diagnosis not present

## 2012-11-13 DIAGNOSIS — I4891 Unspecified atrial fibrillation: Secondary | ICD-10-CM | POA: Diagnosis not present

## 2012-11-24 ENCOUNTER — Telehealth: Payer: Self-pay

## 2012-11-24 DIAGNOSIS — G4700 Insomnia, unspecified: Secondary | ICD-10-CM

## 2012-11-24 MED ORDER — LORAZEPAM 1 MG PO TABS: 1.0000 mg | ORAL_TABLET | Freq: Two times a day (BID) | ORAL | Status: DC | PRN

## 2012-11-24 NOTE — Telephone Encounter (Signed)
Please advise Lorazepam refill? Last RX was done on 07-10-12 quantity 60 with 2 refill  If ok fax to [phone]

## 2012-11-24 NOTE — Telephone Encounter (Signed)
Ok to refill Lorazepam same sig, same strength, same number with 2 rf

## 2012-11-24 NOTE — Telephone Encounter (Signed)
Pharmacist with [facility] left a message stating that they don't need refills on Alprazolam they just need to verify that its ok to take Alprazolam and Temazepam at the same time?  Verbal per MD that she thought pt was taking Zolpidem?  I spoke to pharmacist and she stated that they have never filled Zolpidem nor do they have an RX for the pt for Zolpidem?  I looked and it looks like someone (Mindy?) added Zolpidem to pts list but nothing was sent to pharmacy. The last RX for Zolpidem that I see is 03-07-11 quantity 30 with 5 refills? Pharmacist states that pt has been picking up Temazepam (March, April, May and June)  Please advise?

## 2012-11-25 NOTE — Telephone Encounter (Signed)
So take Zolpidem out of list, can refill Temazepam with 30 day supply and 1 rf. Can refill Lorazepam with same number bid prn anxiety only. Disp: 60, no rf

## 2012-11-27 ENCOUNTER — Encounter: Payer: Self-pay | Admitting: Family Medicine

## 2012-11-27 ENCOUNTER — Ambulatory Visit (INDEPENDENT_AMBULATORY_CARE_PROVIDER_SITE_OTHER): Payer: Medicare Other | Admitting: Family Medicine

## 2012-11-27 VITALS — BP 138/72 | HR 82 | Temp 98.4°F | Ht 71.0 in | Wt 225.0 lb

## 2012-11-27 DIAGNOSIS — I10 Essential (primary) hypertension: Secondary | ICD-10-CM

## 2012-11-27 DIAGNOSIS — Z9189 Other specified personal risk factors, not elsewhere classified: Secondary | ICD-10-CM

## 2012-11-27 DIAGNOSIS — G4700 Insomnia, unspecified: Secondary | ICD-10-CM

## 2012-11-27 DIAGNOSIS — E119 Type 2 diabetes mellitus without complications: Secondary | ICD-10-CM

## 2012-11-27 DIAGNOSIS — E785 Hyperlipidemia, unspecified: Secondary | ICD-10-CM

## 2012-11-27 DIAGNOSIS — I509 Heart failure, unspecified: Secondary | ICD-10-CM | POA: Diagnosis not present

## 2012-11-27 DIAGNOSIS — M109 Gout, unspecified: Secondary | ICD-10-CM | POA: Diagnosis not present

## 2012-11-27 MED ORDER — LORAZEPAM 1 MG PO TABS: ORAL_TABLET | ORAL | Status: DC

## 2012-11-27 NOTE — Patient Instructions (Addendum)
Gout  Gout is an inflammatory condition (arthritis) caused by a buildup of uric acid crystals in the joints. Uric acid is a chemical that is normally present in the blood. Under some circumstances, uric acid can form into crystals in your joints. This causes joint redness, soreness, and swelling (inflammation). Repeat attacks are common. Over time, uric acid crystals can form into masses (tophi) near a joint, causing disfigurement. Gout is treatable and often preventable.  CAUSES   The disease begins with elevated levels of uric acid in the blood. Uric acid is produced by your body when it breaks down a naturally found substance called purines. This also happens when you eat certain foods such as meats and fish. Causes of an elevated uric acid level include:   Being passed down from parent to child (heredity).   Diseases that cause increased uric acid production (obesity, psoriasis, some cancers).   Excessive alcohol use.   Diet, especially diets rich in meat and seafood.   Medicines, including certain cancer-fighting drugs (chemotherapy), diuretics, and aspirin.   Chronic kidney disease. The kidneys are no longer able to remove uric acid well.   Problems with metabolism.  Conditions strongly associated with gout include:   Obesity.   High blood pressure.   High cholesterol.   Diabetes.  Not everyone with elevated uric acid levels gets gout. It is not understood why some people get gout and others do not. Surgery, joint injury, and eating too much of certain foods are some of the factors that can lead to gout.  SYMPTOMS    An attack of gout comes on quickly. It causes intense pain with redness, swelling, and warmth in a joint.   Fever can occur.   Often, only one joint is involved. Certain joints are more commonly involved:   Base of the big toe.   Knee.   Ankle.   Wrist.   Finger.  Without treatment, an attack usually goes away in a few days to weeks. Between attacks, you usually will not have  symptoms, which is different from many other forms of arthritis.  DIAGNOSIS   Your caregiver will suspect gout based on your symptoms and exam. Removal of fluid from the joint (arthrocentesis) is done to check for uric acid crystals. Your caregiver will give you a medicine that numbs the area (local anesthetic) and use a needle to remove joint fluid for exam. Gout is confirmed when uric acid crystals are seen in joint fluid, using a special microscope. Sometimes, blood, urine, and X-ray tests are also used.  TREATMENT   There are 2 phases to gout treatment: treating the sudden onset (acute) attack and preventing attacks (prophylaxis).  Treatment of an Acute Attack   Medicines are used. These include anti-inflammatory medicines or steroid medicines.   An injection of steroid medicine into the affected joint is sometimes necessary.   The painful joint is rested. Movement can worsen the arthritis.   You may use warm or cold treatments on painful joints, depending which works best for you.   Discuss the use of coffee, vitamin C, or cherries with your caregiver. These may be helpful treatment options.  Treatment to Prevent Attacks  After the acute attack subsides, your caregiver may advise prophylactic medicine. These medicines either help your kidneys eliminate uric acid from your body or decrease your uric acid production. You may need to stay on these medicines for a very long time.  The early phase of treatment with prophylactic medicine can be associated   with an increase in acute gout attacks. For this reason, during the first few months of treatment, your caregiver may also advise you to take medicines usually used for acute gout treatment. Be sure you understand your caregiver's directions.  You should also discuss dietary treatment with your caregiver. Certain foods such as meats and fish can increase uric acid levels. Other foods such as dairy can decrease levels. Your caregiver can give you a list of foods  to avoid.  HOME CARE INSTRUCTIONS    Do not take aspirin to relieve pain. This raises uric acid levels.   Only take over-the-counter or prescription medicines for pain, discomfort, or fever as directed by your caregiver.   Rest the joint as much as possible. When in bed, keep sheets and blankets off painful areas.   Keep the affected joint raised (elevated).   Use crutches if the painful joint is in your leg.   Drink enough water and fluids to keep your urine clear or pale yellow. This helps your body get rid of uric acid. Do not drink alcoholic beverages. They slow the passage of uric acid.   Follow your caregiver's dietary instructions. Pay careful attention to the amount of protein you eat. Your daily diet should emphasize fruits, vegetables, whole grains, and fat-free or low-fat milk products.   Maintain a healthy body weight.  SEEK MEDICAL CARE IF:    You have an oral temperature above 102 F (38.9 C).   You develop diarrhea, vomiting, or any side effects from medicines.   You do not feel better in 24 hours, or you are getting worse.  SEEK IMMEDIATE MEDICAL CARE IF:    Your joint becomes suddenly more tender and you have:   Chills.   An oral temperature above 102 F (38.9 C), not controlled by medicine.  MAKE SURE YOU:    Understand these instructions.   Will watch your condition.   Will get help right away if you are not doing well or get worse.  Document Released: 05/21/2000 Document Revised: 08/16/2011 Document Reviewed: 09/01/2009  [employer] Patient Information 2014 [employer]

## 2012-11-27 NOTE — Telephone Encounter (Signed)
Pharmacy [facility]) informed that it is ok to refill the Temazepam

## 2012-11-28 LAB — RENAL FUNCTION PANEL
Albumin: 4.3 g/dL (ref 3.5–5.2)
BUN: 15 mg/dL (ref 6–23)
CO2: 28 meq/L (ref 19–32)
Calcium: 10.4 mg/dL (ref 8.4–10.5)
Chloride: 107 meq/L (ref 96–112)
Creat: 1.14 mg/dL (ref 0.50–1.35)
Glucose, Bld: 117 mg/dL — ABNORMAL HIGH (ref 70–99)
Phosphorus: 3.4 mg/dL (ref 2.3–4.6)
Potassium: 5 meq/L (ref 3.5–5.3)
Sodium: 143 meq/L (ref 135–145)

## 2012-11-28 LAB — URIC ACID: Uric Acid, Serum: 6.6 mg/dL (ref 4.0–7.8)

## 2012-12-02 NOTE — Progress Notes (Signed)
Patient ID: [name], male   DOB: September 26, 1950, 62 y.o.   MRN: [id number] [name] [id number] 15-Aug-1950 12/02/2012      Progress Note-Follow Up  Subjective  Chief Complaint  Chief Complaint  Patient presents with  . Follow-up    4 week    HPI  Patient is a 62 year old African American male who is in today in followup. Generally doing well. No recent illness. No recent hospitalization. Taking medications as prescribed. Continues to show with fatigue and dyspnea but it is not worsening. No chest pain or palpitations. No GU complaints. Continues to struggle with insomnia. Has tried numerous agents over the years but only Restoril seems to be helpful. Will discontinue Ambien and restart Restoril  Past Medical History  Diagnosis Date  . Hypertension   . Hyperlipidemia   . Atrial fibrillation   . UNSPECIFIED ANEMIA 01/05/2010  . TESTICULAR HYPOFUNCTION 01/05/2010  . PULMONARY FUNCTION TESTS, ABNORMAL 02/02/2010  . PERIPHERAL NEUROPATHY 01/05/2010  . OBESITY NOS 01/25/2007  . NEUROMA 01/05/2010  . INSOMNIA, HX OF 01/25/2007  . HYPERTENSION 01/25/2007  . FATIGUE, CHRONIC 11/11/2009  . ERECTILE DYSFUNCTION 01/25/2007  . DYSPNEA 12/19/2009  . CHF 12/05/2009  . CHEST PAIN-UNSPECIFIED 11/11/2009  . BENIGN PROSTATIC HYPERTROPHY, HX OF 12/05/2009  . Atrial flutter 12/05/2009  . AORTIC STENOSIS, MODERATE 12/05/2009  . Knee pain, right 12/28/2010  . Hypoxia 05/05/2011  . Heart murmur   . SLEEP APNEA 04/06/2010    uses cpap  . DM 12/05/2009  . Superficial thrombophlebitis of left leg 09/07/2011  . Atypical chest pain 11/08/2011  . 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 Hyperkalemia 04/18/2012  . Edema 04/18/2012  . Debility 04/18/2012  . Toe pain, right 07/10/2012  . Gout 07/10/2012    Past Surgical History  Procedure Laterality Date  . Bilat foot surgery removed part of 5th metatarsal to corect curvature of toes      Family History  Problem Relation Age of Onset  . Clotting disorder Mother   . Heart  disease Mother     s/p MI  . Heart attack Mother   . Hypertension Mother   . Diabetes Mother   . Hyperlipidemia Mother   . Stroke Mother   . Cancer Father     ? lung  . Lung disease Father     smoker  . Diabetes Sister   . Hypertension Sister     smoker  . Leukemia Maternal Grandmother     ?  [name] Aneurysm Sister     brain  . Other Sister     clipped  . Seizures Sister     d/o w/aneurysm/ smoker    History   Social History  . Marital Status: Married    Spouse Name: N/A    Number of Children: N/A  . Years of Education: N/A   Occupational History  . Not on file.   Social History Main Topics  . Smoking status: Former Smoker -- 1.00 packs/day for 10 years    Quit date: 06/07/1980  . Smokeless tobacco: Never Used  . Alcohol Use: No  . Drug Use: No  . Sexually Active: Not Currently   Other Topics Concern  . Not on file   Social History Narrative  . No narrative on file    Current Outpatient Prescriptions on File Prior to Visit  Medication Sig Dispense Refill  . allopurinol (ZYLOPRIM) 300 MG tablet Take 1 tablet (300 mg total) by mouth at bedtime.  30 tablet  2  .  aspirin EC 81 MG tablet Take 81 mg by mouth daily.      . benazepril (LOTENSIN) 40 MG tablet Take 40 mg by mouth daily.      . calcium carbonate (TUMS - DOSED IN MG ELEMENTAL CALCIUM) 500 MG chewable tablet Chew 1 tablet by mouth daily as needed. For heartburn      . colchicine 0.6 MG tablet 1 tab po q 2 hours x 4 doses or pain relief today then 1 tab po q day  35 tablet  1  . dofetilide (TIKOSYN) 500 MCG capsule Take 1 capsule (500 mcg total) by mouth every 12 (twelve) hours.  60 capsule  11  . fish oil-omega-3 fatty acids 1000 MG capsule Take 1 g by mouth daily.       . furosemide (LASIX) 40 MG tablet Take 20-40 mg by mouth daily.       [name] glyBURIDE (DIABETA) 5 MG tablet Take 1 tablet (5 mg total) by mouth daily with breakfast.  30 tablet  6  . HYDROcodone-acetaminophen (NORCO) 10-325 MG per tablet TAKE  ONE TABLET BY MOUTH EVERY 6 HOURS AS NEEDED FOR PAIN  90 tablet  0  . metFORMIN (GLUCOPHAGE) 1000 MG tablet Take 1 tablet (1,000 mg total) by mouth 2 (two) times daily with a meal.  60 tablet  5  . metoprolol (LOPRESSOR) 100 MG tablet Take 1 tablet (100 mg total) by mouth 2 (two) times daily.  60 tablet  3  . Multiple Vitamins-Minerals (MULTIVITAMIN PO) Take 1 tablet by mouth daily.      . naproxen (NAPROSYN) 375 MG tablet       . pravastatin (PRAVACHOL) 40 MG tablet Take 40 mg by mouth at bedtime.       . temazepam (RESTORIL) 30 MG capsule Take 30 mg by mouth at bedtime as needed for sleep.      . Testosterone (ANDROGEL) 20.25 MG/1.25GM (1.62%) GEL Place 4 Squirts onto the skin daily.       [name] warfarin (COUMADIN) 4 MG tablet Take 1 tablet (4mg ) on all days EXCEPT on Monday and Friday. On Monday and Friday, take one-half tablet (2mg ).       No current facility-administered medications on file prior to visit.    Allergies  Allergen Reactions  . Sulfonamide Derivatives Other (See Comments)    unknown reaction  . Zolpidem     Excessive, prolonged sedation    Review of Systems Review of Systems  Constitutional: Negative for fever and malaise/fatigue.  HENT: Negative for congestion.   Eyes: Negative for pain and discharge.  Respiratory: Negative for shortness of breath.   Cardiovascular: Negative for chest pain, palpitations and leg swelling.  Gastrointestinal: Negative for nausea, abdominal pain and diarrhea.  Genitourinary: Negative for dysuria.  Musculoskeletal: Negative for falls.  Skin: Negative for rash.  Neurological: Negative for loss of consciousness and headaches.  Endo/Heme/Allergies: Negative for polydipsia.  Psychiatric/Behavioral: Negative for depression and suicidal ideas. The patient has insomnia. The patient is not nervous/anxious.     Objective  BP 138/72  Pulse 82  Temp(Src) 98.4 F (36.9 C) (Oral)  Ht 5\' 11"  (1.803 m)  Wt 225 lb (102.059 kg)  BMI 31.39 kg/m2   SpO2 94%  Physical Exam  Physical Exam  Constitutional: He is oriented to person, place, and time and well-developed, well-nourished, and in no distress. No distress.  HENT:  Head: Normocephalic and atraumatic.  Eyes: Conjunctivae are normal.  Neck: Neck supple. No thyromegaly present.  Cardiovascular: Normal  rate and normal heart sounds.   Pulmonary/Chest: Effort normal. No respiratory distress. He has no wheezes.  Abdominal: He exhibits no distension and no mass. There is no tenderness.  Musculoskeletal: He exhibits no edema.  Neurological: He is alert and oriented to person, place, and time.  Skin: Skin is warm.  Psychiatric: Memory, affect and judgment normal.    Lab Results  Component Value Date   TSH 0.905 10/31/2012   Lab Results  Component Value Date   WBC 3.6* 10/31/2012   HGB 14.3 10/31/2012   HCT 42.9 10/31/2012   MCV 77.2* 10/31/2012   PLT 211 10/31/2012   Lab Results  Component Value Date   CREATININE 1.14 11/27/2012   BUN 15 11/27/2012   NA 143 11/27/2012   K 5.0 11/27/2012   CL 107 11/27/2012   CO2 28 11/27/2012   Lab Results  Component Value Date   ALT 14 10/31/2012   AST 17 10/31/2012   ALKPHOS 35* 10/31/2012   BILITOT 0.9 10/31/2012   Lab Results  Component Value Date   CHOL 124 10/31/2012   Lab Results  Component Value Date   HDL 32* 10/31/2012   Lab Results  Component Value Date   LDLCALC 70 10/31/2012   Lab Results  Component Value Date   TRIG 111 10/31/2012   Lab Results  Component Value Date   CHOLHDL 3.9 10/31/2012     Assessment & Plan  HYPERTENSION Well controlled, no changes  CHF Follows closely with cardiology secondary to his complicated history. Doing well at the present time, no changes.  Hyperlipidemia Tolerating Pravastatin, avoid trans fats, no changes  DM Last hgba1c normal, minimize simple carbs and continue current meds  INSOMNIA, HX OF alllowed refills on his meds.

## 2012-12-02 NOTE — Assessment & Plan Note (Signed)
Tolerating Pravastatin, avoid trans fats, no changes

## 2012-12-02 NOTE — Assessment & Plan Note (Signed)
Well controlled, no changes 

## 2012-12-02 NOTE — Assessment & Plan Note (Signed)
Follows closely with cardiology secondary to his complicated history. Doing well at the present time, no changes.

## 2012-12-02 NOTE — Assessment & Plan Note (Signed)
alllowed refills on his meds.

## 2012-12-02 NOTE — Assessment & Plan Note (Signed)
Last hgba1c normal, minimize simple carbs and continue current meds

## 2012-12-11 ENCOUNTER — Ambulatory Visit (INDEPENDENT_AMBULATORY_CARE_PROVIDER_SITE_OTHER): Payer: Medicare Other | Admitting: Pharmacist Clinician (PhC)/ Clinical Pharmacy Specialist

## 2012-12-11 VITALS — BP 144/80 | HR 72

## 2012-12-11 DIAGNOSIS — Z7901 Long term (current) use of anticoagulants: Secondary | ICD-10-CM

## 2012-12-11 DIAGNOSIS — I4891 Unspecified atrial fibrillation: Secondary | ICD-10-CM

## 2012-12-11 LAB — POCT INR: INR: 2.1

## 2013-01-08 ENCOUNTER — Other Ambulatory Visit: Payer: Self-pay | Admitting: Cardiovascular Disease

## 2013-01-09 NOTE — Telephone Encounter (Signed)
Rx was sent to pharmacy electronically. 

## 2013-01-12 ENCOUNTER — Encounter: Payer: Self-pay | Admitting: *Deleted

## 2013-01-15 ENCOUNTER — Ambulatory Visit: Payer: Medicare Other | Admitting: Cardiovascular Disease

## 2013-01-15 ENCOUNTER — Ambulatory Visit (INDEPENDENT_AMBULATORY_CARE_PROVIDER_SITE_OTHER): Payer: Medicare Other | Admitting: Pharmacist Clinician (PhC)/ Clinical Pharmacy Specialist

## 2013-01-15 VITALS — BP 130/76 | HR 80

## 2013-01-15 DIAGNOSIS — Z7901 Long term (current) use of anticoagulants: Secondary | ICD-10-CM

## 2013-01-15 DIAGNOSIS — I4891 Unspecified atrial fibrillation: Secondary | ICD-10-CM

## 2013-01-15 MED ORDER — DOFETILIDE 500 MCG PO CAPS: 500.0000 ug | ORAL_CAPSULE | Freq: Two times a day (BID) | ORAL | Status: DC

## 2013-01-31 ENCOUNTER — Ambulatory Visit: Payer: Medicare Other | Admitting: Cardiovascular Disease

## 2013-01-31 ENCOUNTER — Ambulatory Visit (INDEPENDENT_AMBULATORY_CARE_PROVIDER_SITE_OTHER): Payer: Medicare Other | Admitting: Family Medicine

## 2013-01-31 ENCOUNTER — Encounter: Payer: Self-pay | Admitting: Family Medicine

## 2013-01-31 VITALS — BP 122/82 | HR 67 | Temp 97.6°F | Ht 71.0 in | Wt 227.1 lb

## 2013-01-31 DIAGNOSIS — G4700 Insomnia, unspecified: Secondary | ICD-10-CM

## 2013-01-31 DIAGNOSIS — Z9189 Other specified personal risk factors, not elsewhere classified: Secondary | ICD-10-CM

## 2013-01-31 DIAGNOSIS — Z23 Encounter for immunization: Secondary | ICD-10-CM

## 2013-01-31 DIAGNOSIS — I10 Essential (primary) hypertension: Secondary | ICD-10-CM | POA: Diagnosis not present

## 2013-01-31 DIAGNOSIS — E785 Hyperlipidemia, unspecified: Secondary | ICD-10-CM | POA: Diagnosis not present

## 2013-01-31 DIAGNOSIS — I359 Nonrheumatic aortic valve disorder, unspecified: Secondary | ICD-10-CM

## 2013-01-31 DIAGNOSIS — E119 Type 2 diabetes mellitus without complications: Secondary | ICD-10-CM

## 2013-01-31 DIAGNOSIS — M25569 Pain in unspecified knee: Secondary | ICD-10-CM | POA: Diagnosis not present

## 2013-01-31 DIAGNOSIS — M25561 Pain in right knee: Secondary | ICD-10-CM

## 2013-01-31 DIAGNOSIS — G4730 Sleep apnea, unspecified: Secondary | ICD-10-CM

## 2013-01-31 DIAGNOSIS — E875 Hyperkalemia: Secondary | ICD-10-CM

## 2013-01-31 LAB — CBC
MCH: 24.9 pg — ABNORMAL LOW (ref 26.0–34.0)
MCHC: 32.5 g/dL (ref 30.0–36.0)
Platelets: 184 10*3/uL (ref 150–400)
RDW: 16.5 % — ABNORMAL HIGH (ref 11.5–15.5)

## 2013-01-31 LAB — HEPATIC FUNCTION PANEL
ALT: 18 U/L (ref 0–53)
Bilirubin, Direct: 0.2 mg/dL (ref 0.0–0.3)

## 2013-01-31 LAB — RENAL FUNCTION PANEL
Albumin: 4.3 g/dL (ref 3.5–5.2)
Calcium: 9.5 mg/dL (ref 8.4–10.5)
Glucose, Bld: 104 mg/dL — ABNORMAL HIGH (ref 70–99)
Sodium: 142 mEq/L (ref 135–145)

## 2013-01-31 LAB — LIPID PANEL
Cholesterol: 145 mg/dL (ref 0–200)
Total CHOL/HDL Ratio: 4.5 Ratio

## 2013-01-31 LAB — HEMOGLOBIN A1C: Mean Plasma Glucose: 143 mg/dL — ABNORMAL HIGH (ref <117)

## 2013-01-31 LAB — TSH: TSH: 1.2 u[IU]/mL (ref 0.350–4.500)

## 2013-01-31 MED ORDER — TEMAZEPAM 30 MG PO CAPS: 30.0000 mg | ORAL_CAPSULE | Freq: Every evening | ORAL | Status: DC | PRN

## 2013-01-31 MED ORDER — HYDROCODONE-ACETAMINOPHEN 10-325 MG PO TABS: 1.0000 | ORAL_TABLET | Freq: Three times a day (TID) | ORAL | Status: DC | PRN

## 2013-01-31 NOTE — Patient Instructions (Addendum)
Gout  Gout is an inflammatory condition (arthritis) caused by a buildup of uric acid crystals in the joints. Uric acid is a chemical that is normally present in the blood. Under some circumstances, uric acid can form into crystals in your joints. This causes joint redness, soreness, and swelling (inflammation). Repeat attacks are common. Over time, uric acid crystals can form into masses (tophi) near a joint, causing disfigurement. Gout is treatable and often preventable.  CAUSES   The disease begins with elevated levels of uric acid in the blood. Uric acid is produced by your body when it breaks down a naturally found substance called purines. This also happens when you eat certain foods such as meats and fish. Causes of an elevated uric acid level include:   Being passed down from parent to child (heredity).   Diseases that cause increased uric acid production (obesity, psoriasis, some cancers).   Excessive alcohol use.   Diet, especially diets rich in meat and seafood.   Medicines, including certain cancer-fighting drugs (chemotherapy), diuretics, and aspirin.   Chronic kidney disease. The kidneys are no longer able to remove uric acid well.   Problems with metabolism.  Conditions strongly associated with gout include:   Obesity.   High blood pressure.   High cholesterol.   Diabetes.  Not everyone with elevated uric acid levels gets gout. It is not understood why some people get gout and others do not. Surgery, joint injury, and eating too much of certain foods are some of the factors that can lead to gout.  SYMPTOMS    An attack of gout comes on quickly. It causes intense pain with redness, swelling, and warmth in a joint.   Fever can occur.   Often, only one joint is involved. Certain joints are more commonly involved:   Base of the big toe.   Knee.   Ankle.   Wrist.   Finger.  Without treatment, an attack usually goes away in a few days to weeks. Between attacks, you usually will not have  symptoms, which is different from many other forms of arthritis.  DIAGNOSIS   Your caregiver will suspect gout based on your symptoms and exam. Removal of fluid from the joint (arthrocentesis) is done to check for uric acid crystals. Your caregiver will give you a medicine that numbs the area (local anesthetic) and use a needle to remove joint fluid for exam. Gout is confirmed when uric acid crystals are seen in joint fluid, using a special microscope. Sometimes, blood, urine, and X-ray tests are also used.  TREATMENT   There are 2 phases to gout treatment: treating the sudden onset (acute) attack and preventing attacks (prophylaxis).  Treatment of an Acute Attack   Medicines are used. These include anti-inflammatory medicines or steroid medicines.   An injection of steroid medicine into the affected joint is sometimes necessary.   The painful joint is rested. Movement can worsen the arthritis.   You may use warm or cold treatments on painful joints, depending which works best for you.   Discuss the use of coffee, vitamin C, or cherries with your caregiver. These may be helpful treatment options.  Treatment to Prevent Attacks  After the acute attack subsides, your caregiver may advise prophylactic medicine. These medicines either help your kidneys eliminate uric acid from your body or decrease your uric acid production. You may need to stay on these medicines for a very long time.  The early phase of treatment with prophylactic medicine can be associated   with an increase in acute gout attacks. For this reason, during the first few months of treatment, your caregiver may also advise you to take medicines usually used for acute gout treatment. Be sure you understand your caregiver's directions.  You should also discuss dietary treatment with your caregiver. Certain foods such as meats and fish can increase uric acid levels. Other foods such as dairy can decrease levels. Your caregiver can give you a list of foods  to avoid.  HOME CARE INSTRUCTIONS    Do not take aspirin to relieve pain. This raises uric acid levels.   Only take over-the-counter or prescription medicines for pain, discomfort, or fever as directed by your caregiver.   Rest the joint as much as possible. When in bed, keep sheets and blankets off painful areas.   Keep the affected joint raised (elevated).   Use crutches if the painful joint is in your leg.   Drink enough water and fluids to keep your urine clear or pale yellow. This helps your body get rid of uric acid. Do not drink alcoholic beverages. They slow the passage of uric acid.   Follow your caregiver's dietary instructions. Pay careful attention to the amount of protein you eat. Your daily diet should emphasize fruits, vegetables, whole grains, and fat-free or low-fat milk products.   Maintain a healthy body weight.  SEEK MEDICAL CARE IF:    You have an oral temperature above 102 F (38.9 C).   You develop diarrhea, vomiting, or any side effects from medicines.   You do not feel better in 24 hours, or you are getting worse.  SEEK IMMEDIATE MEDICAL CARE IF:    Your joint becomes suddenly more tender and you have:   Chills.   An oral temperature above 102 F (38.9 C), not controlled by medicine.  MAKE SURE YOU:    Understand these instructions.   Will watch your condition.   Will get help right away if you are not doing well or get worse.  Document Released: 05/21/2000 Document Revised: 08/16/2011 Document Reviewed: 09/01/2009  [employer] Patient Information 2014 [employer]

## 2013-02-04 ENCOUNTER — Encounter: Payer: Self-pay | Admitting: Family Medicine

## 2013-02-04 NOTE — Assessment & Plan Note (Signed)
Tolerating Pravastatin, adequate response.

## 2013-02-04 NOTE — Assessment & Plan Note (Signed)
Well controlled, no changes 

## 2013-02-04 NOTE — Assessment & Plan Note (Signed)
hgba1c 6.6. Continue current meds. Avoid simple carbs.

## 2013-02-04 NOTE — Assessment & Plan Note (Signed)
Using CPAP 

## 2013-02-04 NOTE — Assessment & Plan Note (Signed)
resolved 

## 2013-02-04 NOTE — Assessment & Plan Note (Signed)
Cardiac condition stable at this time. Is following closely with carediology.

## 2013-02-04 NOTE — Assessment & Plan Note (Signed)
Does well on Temazepam, no other med seems to be effective. Last time we tried to d/c he presented to ED.

## 2013-02-04 NOTE — Progress Notes (Signed)
Patient ID: [name], male   DOB: 10/05/1950, 62 y.o.   MRN: [id number] [name] [id number] 09-16-1950 02/04/2013      Progress Note-Follow Up  Subjective  Chief Complaint  Chief Complaint  Patient presents with  . Follow-up    HPI  Patient is a 62 year old AA male in today for followup. Continues to struggle with chronic pain in his knees and back. Has been seen by ortho and they notified him he would qualify for knee replacement but is not anxious to proceed. No recent hospitalization or illness. Is sleeping 5-6 hours a night with temazepam. Without a does not sleep. No trips to any emergency room. No fevers or chills. No chest pain, palpitations, shortness of breath GI or GU concerns noted.  Past Medical History  Diagnosis Date  . Hypertension   . Hyperlipidemia   . Atrial fibrillation   . UNSPECIFIED ANEMIA 01/05/2010  . TESTICULAR HYPOFUNCTION 01/05/2010  . PULMONARY FUNCTION TESTS, ABNORMAL 02/02/2010  . PERIPHERAL NEUROPATHY 01/05/2010  . OBESITY NOS 01/25/2007  . NEUROMA 01/05/2010  . INSOMNIA, HX OF 01/25/2007  . HYPERTENSION 01/25/2007  . FATIGUE, CHRONIC 11/11/2009  . ERECTILE DYSFUNCTION 01/25/2007  . DYSPNEA 12/19/2009  . CHF 12/05/2009  . CHEST PAIN-UNSPECIFIED 11/11/2009  . BENIGN PROSTATIC HYPERTROPHY, HX OF 12/05/2009  . Atrial flutter 12/05/2009  . AORTIC STENOSIS, MODERATE 12/05/2009  . Knee pain, right 12/28/2010  . Hypoxia 05/05/2011  . Heart murmur   . SLEEP APNEA 04/06/2010    uses cpap  . DM 12/05/2009  . Superficial thrombophlebitis of left leg 09/07/2011  . Atypical chest pain 11/08/2011  . 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 Hyperkalemia 04/18/2012  . Edema 04/18/2012  . Debility 04/18/2012  . Toe pain, right 07/10/2012  . Gout 07/10/2012    Past Surgical History  Procedure Laterality Date  . Bilat foot surgery removed part of 5th metatarsal to corect curvature of toes    . Cardiac catheterization  10/16/2009    nonischemic cardiomyopathy    Family History   Problem Relation Age of Onset  . Clotting disorder Mother   . Heart disease Mother     s/p MI  . Heart attack Mother   . Hypertension Mother   . Diabetes Mother   . Hyperlipidemia Mother   . Stroke Mother   . Cancer Father     ? lung  . Lung disease Father     smoker  . Diabetes Sister   . Hypertension Sister     smoker  . Leukemia Maternal Grandmother     ?  [name] Aneurysm Sister     brain  . Other Sister     clipped  . Seizures Sister     d/o w/aneurysm/ smoker    History   Social History  . Marital Status: Married    Spouse Name: N/A    Number of Children: N/A  . Years of Education: N/A   Occupational History  . Not on file.   Social History Main Topics  . Smoking status: Former Smoker -- 1.00 packs/day for 10 years    Quit date: 06/07/1980  . Smokeless tobacco: Never Used  . Alcohol Use: No  . Drug Use: No  . Sexual Activity: Not Currently   Other Topics Concern  . Not on file   Social History Narrative  . No narrative on file    Current Outpatient Prescriptions on File Prior to Visit  Medication Sig Dispense Refill  . allopurinol (ZYLOPRIM) 300  MG tablet Take 1 tablet (300 mg total) by mouth at bedtime.  30 tablet  2  . aspirin EC 81 MG tablet Take 81 mg by mouth daily.      . benazepril (LOTENSIN) 40 MG tablet Take 40 mg by mouth daily.      . calcium carbonate (TUMS - DOSED IN MG ELEMENTAL CALCIUM) 500 MG chewable tablet Chew 1 tablet by mouth daily as needed. For heartburn      . colchicine 0.6 MG tablet 1 tab po q 2 hours x 4 doses or pain relief today then 1 tab po q day  35 tablet  1  . dofetilide (TIKOSYN) 500 MCG capsule Take 1 capsule (500 mcg total) by mouth every 12 (twelve) hours.  60 capsule  1  . fish oil-omega-3 fatty acids 1000 MG capsule Take 1 g by mouth daily.       . furosemide (LASIX) 40 MG tablet Take 20-40 mg by mouth daily.       [name] glyBURIDE (DIABETA) 5 MG tablet Take 1 tablet (5 mg total) by mouth daily with breakfast.  30  tablet  6  . LORazepam (ATIVAN) 1 MG tablet 1 tab po bid prn anxiety  60 tablet  2  . metFORMIN (GLUCOPHAGE) 1000 MG tablet Take 1 tablet (1,000 mg total) by mouth 2 (two) times daily with a meal.  60 tablet  5  . metoprolol (LOPRESSOR) 100 MG tablet Take 1 tablet (100 mg total) by mouth 2 (two) times daily.  60 tablet  3  . Multiple Vitamins-Minerals (MULTIVITAMIN PO) Take 1 tablet by mouth daily.      . naproxen (NAPROSYN) 375 MG tablet       . pravastatin (PRAVACHOL) 40 MG tablet Take 40 mg by mouth at bedtime.       . Testosterone (ANDROGEL) 20.25 MG/1.25GM (1.62%) GEL Place 4 Squirts onto the skin daily.       [name] warfarin (COUMADIN) 4 MG tablet TAKE ONE TABLET BY MOUTH ONCE DAILY OR AS DIRECTED  90 tablet  1   No current facility-administered medications on file prior to visit.    Allergies  Allergen Reactions  . Sulfonamide Derivatives Other (See Comments)    unknown reaction  . Zolpidem     Excessive, prolonged sedation    Review of Systems  Review of Systems  Constitutional: Positive for malaise/fatigue. Negative for fever.  HENT: Negative for congestion.   Eyes: Negative for discharge.  Respiratory: Negative for shortness of breath.   Cardiovascular: Negative for chest pain, palpitations and leg swelling.  Gastrointestinal: Negative for nausea, abdominal pain and diarrhea.  Genitourinary: Negative for dysuria.  Musculoskeletal: Negative for falls.  Skin: Negative for rash.  Neurological: Negative for loss of consciousness and headaches.  Endo/Heme/Allergies: Negative for polydipsia.  Psychiatric/Behavioral: Negative for depression and suicidal ideas. The patient has insomnia. The patient is not nervous/anxious.     Objective  BP 122/82  Pulse 67  Temp(Src) 97.6 F (36.4 C) (Oral)  Ht 5\' 11"  (1.803 m)  Wt 227 lb 1.3 oz (103.003 kg)  BMI 31.69 kg/m2  SpO2 97%  Physical Exam  Physical Exam  Constitutional: He is oriented to person, place, and time and  well-developed, well-nourished, and in no distress. No distress.  HENT:  Head: Normocephalic and atraumatic.  Eyes: Conjunctivae are normal.  Neck: Neck supple. No thyromegaly present.  Cardiovascular: Normal rate and regular rhythm.   Murmur heard. Pulmonary/Chest: Effort normal and breath sounds normal. No respiratory  distress.  Abdominal: He exhibits no distension and no mass. There is no tenderness.  Musculoskeletal: He exhibits no edema.  Neurological: He is alert and oriented to person, place, and time.  Skin: Skin is warm.  Psychiatric: Memory, affect and judgment normal.    Lab Results  Component Value Date   TSH 1.200 01/31/2013   Lab Results  Component Value Date   WBC 3.9* 01/31/2013   HGB 14.2 01/31/2013   HCT 43.7 01/31/2013   MCV 76.5* 01/31/2013   PLT 184 01/31/2013   Lab Results  Component Value Date   CREATININE 1.16 01/31/2013   BUN 22 01/31/2013   NA 142 01/31/2013   K 4.1 01/31/2013   CL 104 01/31/2013   CO2 28 01/31/2013   Lab Results  Component Value Date   ALT 18 01/31/2013   AST 17 01/31/2013   ALKPHOS 36* 01/31/2013   BILITOT 0.6 01/31/2013   Lab Results  Component Value Date   CHOL 145 01/31/2013   Lab Results  Component Value Date   HDL 32* 01/31/2013   Lab Results  Component Value Date   LDLCALC 86 01/31/2013   Lab Results  Component Value Date   TRIG 135 01/31/2013   Lab Results  Component Value Date   CHOLHDL 4.5 01/31/2013     Assessment & Plan  HYPERTENSION Well controlled, no changes.  AORTIC STENOSIS, MODERATE Cardiac condition stable at this time. Is following closely with carediology.   DM hgba1c 6.6. Continue current meds. Avoid simple carbs.  SLEEP APNEA Using CPAP  INSOMNIA, HX OF Does well on Temazepam, no other med seems to be effective. Last time we tried to d/c he presented to ED.  Hyperlipidemia Tolerating Pravastatin, adequate response.   Hyperkalemia resolved

## 2013-02-06 ENCOUNTER — Other Ambulatory Visit: Payer: Self-pay | Admitting: *Deleted

## 2013-02-06 DIAGNOSIS — G4700 Insomnia, unspecified: Secondary | ICD-10-CM

## 2013-02-06 DIAGNOSIS — E119 Type 2 diabetes mellitus without complications: Secondary | ICD-10-CM

## 2013-02-06 MED ORDER — METFORMIN HCL 1000 MG PO TABS: 1000.0000 mg | ORAL_TABLET | Freq: Two times a day (BID) | ORAL | Status: DC

## 2013-02-06 NOTE — Telephone Encounter (Signed)
Rx request to pharmacy/[facility]  

## 2013-02-13 ENCOUNTER — Encounter: Payer: Self-pay | Admitting: Cardiovascular Disease

## 2013-02-13 ENCOUNTER — Ambulatory Visit (INDEPENDENT_AMBULATORY_CARE_PROVIDER_SITE_OTHER): Payer: Medicare Other | Admitting: Pharmacist Clinician (PhC)/ Clinical Pharmacy Specialist

## 2013-02-13 ENCOUNTER — Ambulatory Visit (INDEPENDENT_AMBULATORY_CARE_PROVIDER_SITE_OTHER): Payer: Medicare Other | Admitting: Cardiovascular Disease

## 2013-02-13 ENCOUNTER — Other Ambulatory Visit: Payer: Self-pay | Admitting: Cardiovascular Disease

## 2013-02-13 VITALS — BP 130/88 | HR 73 | Ht 71.0 in | Wt 229.9 lb

## 2013-02-13 DIAGNOSIS — I4892 Unspecified atrial flutter: Secondary | ICD-10-CM

## 2013-02-13 DIAGNOSIS — Z7901 Long term (current) use of anticoagulants: Secondary | ICD-10-CM

## 2013-02-13 DIAGNOSIS — R0609 Other forms of dyspnea: Secondary | ICD-10-CM

## 2013-02-13 DIAGNOSIS — I4891 Unspecified atrial fibrillation: Secondary | ICD-10-CM | POA: Diagnosis not present

## 2013-02-13 DIAGNOSIS — G4730 Sleep apnea, unspecified: Secondary | ICD-10-CM | POA: Diagnosis not present

## 2013-02-13 DIAGNOSIS — I351 Nonrheumatic aortic (valve) insufficiency: Secondary | ICD-10-CM

## 2013-02-13 DIAGNOSIS — I359 Nonrheumatic aortic valve disorder, unspecified: Secondary | ICD-10-CM

## 2013-02-13 DIAGNOSIS — I509 Heart failure, unspecified: Secondary | ICD-10-CM

## 2013-02-13 DIAGNOSIS — R011 Cardiac murmur, unspecified: Secondary | ICD-10-CM

## 2013-02-13 DIAGNOSIS — I10 Essential (primary) hypertension: Secondary | ICD-10-CM

## 2013-02-13 DIAGNOSIS — R0600 Dyspnea, unspecified: Secondary | ICD-10-CM

## 2013-02-13 LAB — POCT INR: INR: 2.5

## 2013-02-13 NOTE — Telephone Encounter (Signed)
Rx was sent to pharmacy electronically. 

## 2013-02-13 NOTE — Patient Instructions (Addendum)
Your physician recommends that you weigh, daily, at the same time every day, and in the same amount of clothing. Please record your daily weights on the handout provided and bring it to your next appointment. Please also records if she feels short of breath or dizzy on any of those days.  For worsening dyspnea and increasing weight, increase furosemide to 40 mg once daily (a whole tablet once daily). If you increase furosemide please also take potassium chloride 10 mEq once daily.  Your physician recommends that you schedule a follow-up appointment in: 3 months  Your physician has requested that you have an echocardiogram. Echocardiography is a painless test that uses sound waves to create images of your heart. It provides your doctor with information about the size and shape of your heart and how well your heart's chambers and valves are working. This procedure takes approximately one hour. There are no restrictions for this procedure.

## 2013-02-18 DIAGNOSIS — I351 Nonrheumatic aortic (valve) insufficiency: Secondary | ICD-10-CM | POA: Insufficient documentation

## 2013-02-18 NOTE — Assessment & Plan Note (Signed)
He reports 100% compliance with CPAP and I think that some of the reduction in his arrhythmia burden and improvement in congestive heart failure manifestations is secondary to this.

## 2013-02-18 NOTE — Progress Notes (Signed)
Patient ID: [name], male   DOB: August 26, 1950, 62 y.o.   MRN: [id number]     Reason for office visit Followup congestive heart failure, paroxysmal atrial flutter and fibrillation, obstructive sleep apnea and severe systemic hypertension  [name] has generally done well since his last appointment. He continues to have NYHA functional class II dyspnea on exertion most of the time. He has occasional periods of worsening dyspnea but generally are associated with abdominal distention and some increase in weight. They promptly responds to a temporary increase in Lasix dose. He has not had any recent gout attacks. He never develops ankle edema but does note increase in his abdominal girth has built up fluid.  Blood pressure control has been good. He has been 100% compliant with CPAP therapy for obstructive sleep apnea. Unfortunately he has not lost any weight. He does not smoke.  He thinks he has had occasional episodes of atrial fibrillation but these have generally lasted less than an hour in total duration. He has now been on treatment with Tikosyn for about 18 months with a noticeable decrease in the burden of atrial arrhythmia.  He did not have coronary stenoses at the time of angiography in 2011.  He and his wife are now separated, even though they continue to live under the same roof because of financial constraints.   Allergies  Allergen Reactions  . Sulfonamide Derivatives Other (See Comments)    unknown reaction  . Zolpidem     Excessive, prolonged sedation    Current Outpatient Prescriptions  Medication Sig Dispense Refill  . allopurinol (ZYLOPRIM) 300 MG tablet Take 1 tablet (300 mg total) by mouth at bedtime.  30 tablet  2  . aspirin EC 81 MG tablet Take 81 mg by mouth daily.      . benazepril (LOTENSIN) 40 MG tablet Take 40 mg by mouth daily.      . calcium carbonate (TUMS - DOSED IN MG ELEMENTAL CALCIUM) 500 MG chewable tablet Chew 1 tablet by mouth daily as needed. For  heartburn      . colchicine 0.6 MG tablet 1 tab po q 2 hours x 4 doses or pain relief today then 1 tab po q day  35 tablet  1  . fish oil-omega-3 fatty acids 1000 MG capsule Take 1 g by mouth daily.       . furosemide (LASIX) 40 MG tablet Take 20-40 mg by mouth daily.       [name] glyBURIDE (DIABETA) 5 MG tablet Take 1 tablet (5 mg total) by mouth daily with breakfast.  30 tablet  6  . HYDROcodone-acetaminophen (NORCO) 10-325 MG per tablet Take 1 tablet by mouth every 8 (eight) hours as needed for pain.  90 tablet  2  . LORazepam (ATIVAN) 1 MG tablet 1 tab po bid prn anxiety  60 tablet  2  . metFORMIN (GLUCOPHAGE) 1000 MG tablet Take 1 tablet (1,000 mg total) by mouth 2 (two) times daily with a meal.  60 tablet  5  . metoprolol (LOPRESSOR) 100 MG tablet Take 1 tablet (100 mg total) by mouth 2 (two) times daily.  60 tablet  3  . Multiple Vitamins-Minerals (MULTIVITAMIN PO) Take 1 tablet by mouth daily.      . naproxen (NAPROSYN) 375 MG tablet as needed.       . pravastatin (PRAVACHOL) 40 MG tablet Take 40 mg by mouth at bedtime.       . temazepam (RESTORIL) 30 MG capsule Take 1 capsule (  30 mg total) by mouth at bedtime as needed for sleep or anxiety.  30 capsule  5  . Testosterone (ANDROGEL) 20.25 MG/1.25GM (1.62%) GEL Place 4 Squirts onto the skin daily.       [name] warfarin (COUMADIN) 4 MG tablet TAKE ONE TABLET BY MOUTH ONCE DAILY OR AS DIRECTED  90 tablet  1  . TIKOSYN 500 MCG capsule TAKE 1 CAPSULE BY MOUTH EVERY 12 HOURS  60 capsule  6   No current facility-administered medications for this visit.    Past Medical History  Diagnosis Date  . Hypertension   . Hyperlipidemia   . Atrial fibrillation   . UNSPECIFIED ANEMIA 01/05/2010  . TESTICULAR HYPOFUNCTION 01/05/2010  . PULMONARY FUNCTION TESTS, ABNORMAL 02/02/2010  . PERIPHERAL NEUROPATHY 01/05/2010  . OBESITY NOS 01/25/2007  . NEUROMA 01/05/2010  . INSOMNIA, HX OF 01/25/2007  . HYPERTENSION 01/25/2007  . FATIGUE, CHRONIC 11/11/2009  . ERECTILE  DYSFUNCTION 01/25/2007  . DYSPNEA 12/19/2009  . CHF 12/05/2009  . CHEST PAIN-UNSPECIFIED 11/11/2009  . BENIGN PROSTATIC HYPERTROPHY, HX OF 12/05/2009  . Atrial flutter 12/05/2009  . AORTIC STENOSIS, MODERATE 12/05/2009  . Knee pain, right 12/28/2010  . Hypoxia 05/05/2011  . Heart murmur   . SLEEP APNEA 04/06/2010    uses cpap  . DM 12/05/2009  . Superficial thrombophlebitis of left leg 09/07/2011  . Atypical chest pain 11/08/2011  . 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 Hyperkalemia 04/18/2012  . Edema 04/18/2012  . Debility 04/18/2012  . Toe pain, right 07/10/2012  . Gout 07/10/2012    Past Surgical History  Procedure Laterality Date  . Bilat foot surgery removed part of 5th metatarsal to corect curvature of toes    . Cardiac catheterization  10/16/2009    nonischemic cardiomyopathy    Family History  Problem Relation Age of Onset  . Clotting disorder Mother   . Heart disease Mother     s/p MI  . Heart attack Mother   . Hypertension Mother   . Diabetes Mother   . Hyperlipidemia Mother   . Stroke Mother   . Cancer Father     ? lung  . Lung disease Father     smoker  . Diabetes Sister   . Hypertension Sister     smoker  . Leukemia Maternal Grandmother     ?  [name] Aneurysm Sister     brain  . Other Sister     clipped  . Seizures Sister     d/o w/aneurysm/ smoker    History   Social History  . Marital Status: Married    Spouse Name: N/A    Number of Children: N/A  . Years of Education: N/A   Occupational History  . Not on file.   Social History Main Topics  . Smoking status: Former Smoker -- 1.00 packs/day for 10 years    Quit date: 06/07/1980  . Smokeless tobacco: Never Used  . Alcohol Use: No  . Drug Use: No  . Sexual Activity: Not Currently   Other Topics Concern  . Not on file   Social History Narrative  . No narrative on file    Review of systems: The patient specifically denies any chest pain at rest or with exertion, dyspnea at rest, orthopnea, paroxysmal nocturnal  dyspnea, syncope,focal neurological deficits, intermittent claudication, lower extremity edema, cough, hemoptysis or wheezing.  The patient also denies abdominal pain, nausea, vomiting, dysphagia, diarrhea, constipation, polyuria, polydipsia, dysuria, hematuria, frequency, urgency, abnormal bleeding or bruising, fever, chills, unexpected  weight changes, mood swings, change in skin or hair texture, change in voice quality, auditory or visual problems, allergic reactions or rashes, new musculoskeletal complaints other than usual "aches and pains".   PHYSICAL EXAM BP 130/88  Pulse 73  Ht 5\' 11"  (1.803 m)  Wt 229 lb 14.4 oz (104.282 kg)  BMI 32.08 kg/m2  General: Alert, oriented x3, no distress Head: no evidence of trauma, PERRL, EOMI, no exophtalmos or lid lag, no myxedema, no xanthelasma; normal ears, nose and crowded oropharynx Neck: normal jugular venous pulsations and no hepatojugular reflux; brisk carotid pulses without delay and no carotid bruits Chest: clear to auscultation, no signs of consolidation by percussion or palpation, normal fremitus, symmetrical and full respiratory excursions Cardiovascular: normal position and quality of the apical impulse, regular rhythm, normal first and second heart sounds, loud fourth heart sound, grade 1-2/6 decrescendo diastolic murmur at the left lower sternal border Abdomen: no tenderness or distention, no masses by palpation, no abnormal pulsatility or arterial bruits, normal bowel sounds, no hepatosplenomegaly Extremities: no clubbing, cyanosis or edema; 2+ radial, ulnar and brachial pulses bilaterally; 2+ right femoral, posterior tibial and dorsalis pedis pulses; 2+ left femoral, posterior tibial and dorsalis pedis pulses; no subclavian or femoral bruits Neurological: grossly nonfocal   EKG: Sinus rhythm, prolonged QT interval secondary to antiarrhythmic therapy, mild downsloping ST segment depression T wave inversion in leads 3 and aVF which has  been seen off and on electrocardiograms over the last couple of years.  Lipid Panel     Component Value Date/Time   CHOL 145 01/31/2013 1128   TRIG 135 01/31/2013 1128   HDL 32* 01/31/2013 1128   CHOLHDL 4.5 01/31/2013 1128   VLDL 27 01/31/2013 1128   LDLCALC 86 01/31/2013 1128    BMET    Component Value Date/Time   NA 142 01/31/2013 1128   K 4.1 01/31/2013 1128   CL 104 01/31/2013 1128   CO2 28 01/31/2013 1128   GLUCOSE 104* 01/31/2013 1128   BUN 22 01/31/2013 1128   CREATININE 1.16 01/31/2013 1128   CREATININE 1.2 06/22/2012 0955   CALCIUM 9.5 01/31/2013 1128   GFRNONAA 66* 08/05/2011 0700   GFRAA 77* 08/05/2011 0700     ASSESSMENT AND PLAN SLEEP APNEA He reports 100% compliance with CPAP and I think that some of the reduction in his arrhythmia burden and improvement in congestive heart failure manifestations is secondary to this.  Chronic diastolic congestive heart failure He has preserved left ventricular systolic function but does have concentric left ventricular hypertrophy and at least moderate diastolic dysfunction. Today he appears to be clinically euvolemic. NYHA functional class II. Occasional episodes of worsening shortness of breath. To be related to volume retention manifesting with abdominal distention. There is clear improvement with diuretic therapy. We had a long discussion regarding the importance of strict sodium restriction as well as daily weights and the signs and symptoms of heart failure exacerbation. He seems to have "figured out" how to adjust his furosemide dose (he takes between 20 and 40 mg a day depending on his symptoms and the weight.  Moderate aortic insufficiency Last evaluated by echocardiogram in 2011. Will soon need to take another look to make sure there is no evidence of worsening left ventricular enlargement or dysfunction.  Atrial fibrillation, converted to SR after 2nd dose of Tikosyn. He seems to have had an overall good response to treatment with  Tikosyn. He has occasional palpitations that last up to an hour in duration. It is  impossible to say without a monitor whether or not these truly represent atrial fibrillation. No adjustment is made in his medications at this time. Note corrected QT interval of 480 ms. We did spend some time discussing the risk of drug interactions both with Tikosyn and with warfarin and the need to discuss any new medications or the prescribing physician and his pharmacist to ensure there are no adverse interactions.  Atrial flutter In the past this is proven to be poorly tolerated. 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  HYPERTENSION He has severe hypertension that requires numerous agents for control. Note that he has gout and therefore diuretics need to be used judiciously. He is on a maximum dose of ACE inhibitor and a fairly large dose of beta blockers. Blood pressure control is fair. No changes are made to his medications today.  Orders Placed This Encounter  Procedures  . EKG 12-Lead  . 2D Echocardiogram with contrast   No orders of the defined types were placed in this encounter.    Junious Silk, MD, [facility] [phone] office [phone] pager

## 2013-02-18 NOTE — Assessment & Plan Note (Signed)
He has severe hypertension that requires numerous agents for control. Note that he has gout and therefore diuretics need to be used judiciously. He is on a maximum dose of ACE inhibitor and a fairly large dose of beta blockers. Blood pressure control is fair. No changes are made to his medications today.

## 2013-02-18 NOTE — Assessment & Plan Note (Signed)
In the past this is proven to be poorly tolerated. 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

## 2013-02-18 NOTE — Assessment & Plan Note (Signed)
Last evaluated by echocardiogram in 2011. Will soon need to take another look to make sure there is no evidence of worsening left ventricular enlargement or dysfunction.

## 2013-02-18 NOTE — Assessment & Plan Note (Signed)
He seems to have had an overall good response to treatment with Tikosyn. He has occasional palpitations that last up to an hour in duration. It is impossible to say without a monitor whether or not these truly represent atrial fibrillation. No adjustment is made in his medications at this time. Note corrected QT interval of 480 ms. We did spend some time discussing the risk of drug interactions both with Tikosyn and with warfarin and the need to discuss any new medications or the prescribing physician and his pharmacist to ensure there are no adverse interactions.

## 2013-02-18 NOTE — Assessment & Plan Note (Signed)
He has preserved left ventricular systolic function but does have concentric left ventricular hypertrophy and at least moderate diastolic dysfunction. Today he appears to be clinically euvolemic. NYHA functional class II. Occasional episodes of worsening shortness of breath. To be related to volume retention manifesting with abdominal distention. There is clear improvement with diuretic therapy. We had a long discussion regarding the importance of strict sodium restriction as well as daily weights and the signs and symptoms of heart failure exacerbation. He seems to have "figured out" how to adjust his furosemide dose (he takes between 20 and 40 mg a day depending on his symptoms and the weight.

## 2013-02-20 ENCOUNTER — Other Ambulatory Visit: Payer: Self-pay | Admitting: *Deleted

## 2013-02-20 DIAGNOSIS — G4700 Insomnia, unspecified: Secondary | ICD-10-CM

## 2013-02-20 MED ORDER — TEMAZEPAM 30 MG PO CAPS: 30.0000 mg | ORAL_CAPSULE | Freq: Every evening | ORAL | Status: DC | PRN

## 2013-02-28 ENCOUNTER — Other Ambulatory Visit: Payer: Self-pay | Admitting: *Deleted

## 2013-02-28 DIAGNOSIS — G4700 Insomnia, unspecified: Secondary | ICD-10-CM

## 2013-02-28 DIAGNOSIS — E119 Type 2 diabetes mellitus without complications: Secondary | ICD-10-CM

## 2013-02-28 MED ORDER — METFORMIN HCL 1000 MG PO TABS: 1000.0000 mg | ORAL_TABLET | Freq: Two times a day (BID) | ORAL | Status: DC

## 2013-03-13 ENCOUNTER — Ambulatory Visit (INDEPENDENT_AMBULATORY_CARE_PROVIDER_SITE_OTHER): Payer: Medicare Other | Admitting: Pharmacist Clinician (PhC)/ Clinical Pharmacy Specialist

## 2013-03-13 VITALS — BP 146/82 | HR 84

## 2013-03-13 DIAGNOSIS — Z7901 Long term (current) use of anticoagulants: Secondary | ICD-10-CM

## 2013-03-13 DIAGNOSIS — I4891 Unspecified atrial fibrillation: Secondary | ICD-10-CM | POA: Diagnosis not present

## 2013-03-14 ENCOUNTER — Other Ambulatory Visit: Payer: Self-pay | Admitting: *Deleted

## 2013-03-14 DIAGNOSIS — E119 Type 2 diabetes mellitus without complications: Secondary | ICD-10-CM

## 2013-03-14 MED ORDER — GLYBURIDE 5 MG PO TABS: 5.0000 mg | ORAL_TABLET | Freq: Every day | ORAL | Status: DC

## 2013-03-14 NOTE — Telephone Encounter (Signed)
Rx request to pharmacy/[facility]  

## 2013-03-19 ENCOUNTER — Other Ambulatory Visit: Payer: Self-pay | Admitting: Cardiovascular Disease

## 2013-03-19 ENCOUNTER — Telehealth: Payer: Self-pay | Admitting: Family Medicine

## 2013-03-19 NOTE — Telephone Encounter (Signed)
FYI

## 2013-03-19 NOTE — Telephone Encounter (Signed)
Patient Information:  Caller Name: [name]  Phone: [phone]  Patient: [name], [name]  Gender: Male  DOB: 07/01/1950  Age: 62 Years  PCP: Danise Edge [facility])  Office Follow Up:  Does the office need to follow up with this patient?: No  Instructions For The Office: N/A  RN Note:  Patient states he has noticed over the past week more difficulty with mild shortness of breath; states he notes this if he has not slept as well as usual due to his cpap machine.  Notes his activity tolerance is decreased.  Per breathing difficulty protocol, emergent symptoms denied; appt scheduled 1645 03/20/13 with Dr. Abner Greenspan.  krs/can  Symptoms  Reason For Call & Symptoms: cpap questions; having more difficulty sleeping well, which makes him have more breathing problems during the day  Reviewed Health History In EMR: Yes  Reviewed Medications In EMR: Yes  Reviewed Allergies In EMR: Yes  Reviewed Surgeries / Procedures: Yes  Date of Onset of Symptoms: 03/12/2013  Guideline(s) Used:  Breathing Difficulty  Disposition Per Guideline:   See Within 3 Days in Office  Reason For Disposition Reached:   Moderate longstanding difficulty breathing (e.g., speaks in phrases, SOB even at rest, pulse 100-120) and same as normal  Advice Given:  N/A  Patient Will Follow Care Advice:  YES  Appointment Scheduled:  03/20/2013 16:45:00 Appointment Scheduled Provider:  Danise Edge [facility])

## 2013-03-20 ENCOUNTER — Ambulatory Visit (INDEPENDENT_AMBULATORY_CARE_PROVIDER_SITE_OTHER): Payer: Medicare Other | Admitting: Family Medicine

## 2013-03-20 ENCOUNTER — Ambulatory Visit (HOSPITAL_BASED_OUTPATIENT_CLINIC_OR_DEPARTMENT_OTHER)
Admission: RE | Admit: 2013-03-20 | Discharge: 2013-03-20 | Disposition: A | Discharge status: Home / Self Care | Payer: Medicare Other | Source: Ambulatory Visit | Attending: Family Medicine | Admitting: Family Medicine

## 2013-03-20 ENCOUNTER — Encounter: Payer: Self-pay | Admitting: Family Medicine

## 2013-03-20 VITALS — BP 122/80 | HR 69 | Temp 97.7°F | Ht 71.0 in | Wt 222.0 lb

## 2013-03-20 DIAGNOSIS — R0602 Shortness of breath: Secondary | ICD-10-CM | POA: Diagnosis not present

## 2013-03-20 DIAGNOSIS — R109 Unspecified abdominal pain: Secondary | ICD-10-CM

## 2013-03-20 DIAGNOSIS — R52 Pain, unspecified: Secondary | ICD-10-CM | POA: Diagnosis not present

## 2013-03-20 DIAGNOSIS — I517 Cardiomegaly: Secondary | ICD-10-CM | POA: Diagnosis not present

## 2013-03-20 DIAGNOSIS — R059 Cough, unspecified: Secondary | ICD-10-CM | POA: Insufficient documentation

## 2013-03-20 DIAGNOSIS — R5381 Other malaise: Secondary | ICD-10-CM | POA: Diagnosis not present

## 2013-03-20 DIAGNOSIS — R0902 Hypoxemia: Secondary | ICD-10-CM

## 2013-03-20 DIAGNOSIS — I10 Essential (primary) hypertension: Secondary | ICD-10-CM | POA: Diagnosis not present

## 2013-03-20 DIAGNOSIS — I509 Heart failure, unspecified: Secondary | ICD-10-CM

## 2013-03-20 DIAGNOSIS — R05 Cough: Secondary | ICD-10-CM | POA: Insufficient documentation

## 2013-03-20 DIAGNOSIS — I5032 Chronic diastolic (congestive) heart failure: Secondary | ICD-10-CM

## 2013-03-20 MED ORDER — ALBUTEROL SULFATE HFA 108 (90 BASE) MCG/ACT IN AERS: 2.0000 | INHALATION_SPRAY | Freq: Four times a day (QID) | RESPIRATORY_TRACT | Status: DC | PRN

## 2013-03-20 MED ORDER — NAPROXEN 375 MG PO TABS: 375.0000 mg | ORAL_TABLET | Freq: Every day | ORAL | Status: DC | PRN

## 2013-03-20 NOTE — Patient Instructions (Signed)

## 2013-03-21 LAB — HEPATIC FUNCTION PANEL
ALT: 12 U/L (ref 0–53)
AST: 9 U/L (ref 0–37)
Albumin: 4.2 g/dL (ref 3.5–5.2)
Indirect Bilirubin: 0.5 mg/dL (ref 0.0–0.9)
Total Protein: 6.9 g/dL (ref 6.0–8.3)

## 2013-03-21 LAB — RENAL FUNCTION PANEL
Albumin: 4.2 g/dL (ref 3.5–5.2)
BUN: 18 mg/dL (ref 6–23)
Calcium: 10 mg/dL (ref 8.4–10.5)
Creat: 1.14 mg/dL (ref 0.50–1.35)
Glucose, Bld: 123 mg/dL — ABNORMAL HIGH (ref 70–99)
Potassium: 5 mEq/L (ref 3.5–5.3)

## 2013-03-21 LAB — SEDIMENTATION RATE: Sed Rate: 1 mm/hr (ref 0–16)

## 2013-03-22 LAB — URINALYSIS
Glucose, UA: NEGATIVE mg/dL
Hgb urine dipstick: NEGATIVE
Ketones, ur: NEGATIVE mg/dL
Leukocytes, UA: NEGATIVE
Nitrite: NEGATIVE
Protein, ur: NEGATIVE mg/dL
Urobilinogen, UA: 0.2 mg/dL (ref 0.0–1.0)

## 2013-03-22 LAB — TESTOSTERONE: Testosterone: 395 ng/dL (ref 300–890)

## 2013-03-24 NOTE — Progress Notes (Signed)
Patient ID: [name], male   DOB: April 28, 1951, 62 y.o.   MRN: [id number] [name] [id number] [birth year] 03/24/2013      Progress Note-Follow Up  Subjective  Chief Complaint  Chief Complaint  Patient presents with  . Shortness of Breath    no wheezing, cold symptoms X Sept 9, 14- spoke to cardiologist about this and its getting worse    HPI  [place] male who is in today with complaint of increasing shortness or breath and congestion. He notes symptoms are worsening over about a week. No fevers or chills but has had a slight cough with congestion. Also noticed increased edema. No chest pain or palpitations. No GI or GU complaints. He does have an appointment soon with cardiology for repeat echocardiogram. Taking medications as prescribed  Past Medical History  Diagnosis Date  . Hypertension   . Hyperlipidemia   . Atrial fibrillation   . UNSPECIFIED ANEMIA 01/05/2010  . TESTICULAR HYPOFUNCTION 01/05/2010  . PULMONARY FUNCTION TESTS, ABNORMAL 02/02/2010  . PERIPHERAL NEUROPATHY 01/05/2010  . OBESITY NOS 01/25/2007  . NEUROMA 01/05/2010  . INSOMNIA, HX OF 01/25/2007  . HYPERTENSION 01/25/2007  . FATIGUE, CHRONIC 11/11/2009  . ERECTILE DYSFUNCTION 01/25/2007  . DYSPNEA 12/19/2009  . CHF 12/05/2009  . CHEST PAIN-UNSPECIFIED 11/11/2009  . BENIGN PROSTATIC HYPERTROPHY, HX OF 12/05/2009  . Atrial flutter 12/05/2009  . AORTIC STENOSIS, MODERATE 12/05/2009  . Knee pain, right 12/28/2010  . Hypoxia 05/05/2011  . Heart murmur   . SLEEP APNEA 04/06/2010    uses cpap  . DM 12/05/2009  . Superficial thrombophlebitis of left leg 09/07/2011  . Atypical chest pain 11/08/2011  . 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 Hyperkalemia 04/18/2012  . Edema 04/18/2012  . Debility 04/18/2012  . Toe pain, right 07/10/2012  . Gout 07/10/2012    Past Surgical History  Procedure Laterality Date  . Bilat foot surgery removed part of 5th metatarsal to corect curvature of toes    . Cardiac catheterization  10/16/2009     nonischemic cardiomyopathy    Family History  Problem Relation Age of Onset  . Clotting disorder Mother   . Heart disease Mother     s/p MI  . Heart attack Mother   . Hypertension Mother   . Diabetes Mother   . Hyperlipidemia Mother   . Stroke Mother   . Cancer Father     ? lung  . Lung disease Father     smoker  . Diabetes Sister   . Hypertension Sister     smoker  . Leukemia Maternal Grandmother     ?  [name] Aneurysm Sister     brain  . Other Sister     clipped  . Seizures Sister     d/o w/aneurysm/ smoker    History   Social History  . Marital Status: Married    Spouse Name: N/A    Number of Children: N/A  . Years of Education: N/A   Occupational History  . Not on file.   Social History Main Topics  . Smoking status: Former Smoker -- 1.00 packs/day for 10 years    Quit date: 06/07/1980  . Smokeless tobacco: Never Used  . Alcohol Use: No  . Drug Use: No  . Sexual Activity: Not Currently   Other Topics Concern  . Not on file   Social History Narrative  . No narrative on file    Current Outpatient Prescriptions on File Prior to Visit  Medication Sig Dispense  Refill  . allopurinol (ZYLOPRIM) 300 MG tablet Take 1 tablet (300 mg total) by mouth at bedtime.  30 tablet  2  . aspirin EC 81 MG tablet Take 81 mg by mouth daily.      . benazepril (LOTENSIN) 40 MG tablet Take 40 mg by mouth daily.      . calcium carbonate (TUMS - DOSED IN MG ELEMENTAL CALCIUM) 500 MG chewable tablet Chew 1 tablet by mouth daily as needed. For heartburn      . colchicine 0.6 MG tablet 1 tab po q 2 hours x 4 doses or pain relief today then 1 tab po q day  35 tablet  1  . fish oil-omega-3 fatty acids 1000 MG capsule Take 1 g by mouth daily.       . furosemide (LASIX) 40 MG tablet Take 20-40 mg by mouth daily.       [name] glyBURIDE (DIABETA) 5 MG tablet Take 1 tablet (5 mg total) by mouth daily with breakfast.  30 tablet  3  . HYDROcodone-acetaminophen (NORCO) 10-325 MG per tablet Take 1  tablet by mouth every 8 (eight) hours as needed for pain.  90 tablet  2  . KLOR-CON M20 20 MEQ tablet TAKE ONE-HALF TO ONE TABLET BY MOUTH EVERY DAY AS DIRECTED  30 tablet  6  . LORazepam (ATIVAN) 1 MG tablet 1 tab po bid prn anxiety  60 tablet  2  . metFORMIN (GLUCOPHAGE) 1000 MG tablet Take 1 tablet (1,000 mg total) by mouth 2 (two) times daily with a meal.  60 tablet  5  . metoprolol (LOPRESSOR) 100 MG tablet Take 1 tablet (100 mg total) by mouth 2 (two) times daily.  60 tablet  3  . Multiple Vitamins-Minerals (MULTIVITAMIN PO) Take 1 tablet by mouth daily.      . pravastatin (PRAVACHOL) 40 MG tablet Take 40 mg by mouth at bedtime.       . temazepam (RESTORIL) 30 MG capsule Take 1 capsule (30 mg total) by mouth at bedtime as needed for sleep or anxiety.  30 capsule  5  . Testosterone (ANDROGEL) 20.25 MG/1.25GM (1.62%) GEL Place 4 Squirts onto the skin daily.       [name] TIKOSYN 500 MCG capsule TAKE 1 CAPSULE BY MOUTH EVERY 12 HOURS  60 capsule  6  . warfarin (COUMADIN) 4 MG tablet TAKE ONE TABLET BY MOUTH ONCE DAILY OR AS DIRECTED  90 tablet  1   No current facility-administered medications on file prior to visit.    Allergies  Allergen Reactions  . Sulfonamide Derivatives Other (See Comments)    unknown reaction  . Zolpidem     Excessive, prolonged sedation    Review of Systems  Review of Systems  Constitutional: Positive for malaise/fatigue. Negative for fever.  HENT: Negative for congestion.   Eyes: Negative for discharge.  Respiratory: Positive for shortness of breath.   Cardiovascular: Positive for orthopnea and leg swelling. Negative for chest pain and palpitations.  Gastrointestinal: Negative for nausea, abdominal pain and diarrhea.  Genitourinary: Negative for dysuria.  Musculoskeletal: Positive for myalgias. Negative for falls.  Skin: Negative for rash.  Neurological: Negative for loss of consciousness and headaches.  Endo/Heme/Allergies: Negative for polydipsia.   Psychiatric/Behavioral: Negative for depression and suicidal ideas. The patient is not nervous/anxious and does not have insomnia.     Objective  BP 122/80  Pulse 69  Temp(Src) 97.7 F (36.5 C) (Oral)  Ht 5\' 11"  (1.803 m)  Wt 222 lb 0.6  oz (100.717 kg)  BMI 30.98 kg/m2  SpO2 96%  Physical Exam  Physical Exam  Constitutional: He is oriented to person, place, and time and well-developed, well-nourished, and in no distress. No distress.  HENT:  Head: Normocephalic and atraumatic.  Eyes: Conjunctivae are normal.  Neck: Neck supple. No thyromegaly present.  Cardiovascular: Normal rate, regular rhythm and normal heart sounds.   Pulmonary/Chest: Effort normal and breath sounds normal. No respiratory distress.  Abdominal: He exhibits no distension and no mass. There is no tenderness.  Musculoskeletal: He exhibits no edema.  Neurological: He is alert and oriented to person, place, and time.  Skin: Skin is warm.  Psychiatric: Memory, affect and judgment normal.    Lab Results  Component Value Date   TSH 1.200 01/31/2013   Lab Results  Component Value Date   WBC 3.9* 01/31/2013   HGB 14.2 01/31/2013   HCT 43.7 01/31/2013   MCV 76.5* 01/31/2013   PLT 184 01/31/2013   Lab Results  Component Value Date   CREATININE 1.14 03/20/2013   BUN 18 03/20/2013   NA 139 03/20/2013   K 5.0 03/20/2013   CL 104 03/20/2013   CO2 29 03/20/2013   Lab Results  Component Value Date   ALT 12 03/20/2013   AST 9 03/20/2013   ALKPHOS 41 03/20/2013   BILITOT 0.7 03/20/2013   Lab Results  Component Value Date   CHOL 145 01/31/2013   Lab Results  Component Value Date   HDL 32* 01/31/2013   Lab Results  Component Value Date   LDLCALC 86 01/31/2013   Lab Results  Component Value Date   TRIG 135 01/31/2013   Lab Results  Component Value Date   CHOLHDL 4.5 01/31/2013     Assessment & Plan  HYPERTENSION Well controlled no changes.  Hypoxia Good O2 in office today on room  air  Chronic diastolic congestive heart failure Weight is down from last visit. Has appt soon with cardiology for repeat echo. He is encouraged to continue diuretics and minimize sodium, no heavy exertion. Renal panel wnl. Seek care if symptoms worsen

## 2013-03-24 NOTE — Assessment & Plan Note (Signed)
Good O2 in office today on room air

## 2013-03-24 NOTE — Assessment & Plan Note (Signed)
Well controlled no changes 

## 2013-03-24 NOTE — Assessment & Plan Note (Signed)
Weight is down from last visit. Has appt soon with cardiology for repeat echo. He is encouraged to continue diuretics and minimize sodium, no heavy exertion. Renal panel wnl. Seek care if symptoms worsen

## 2013-04-04 ENCOUNTER — Ambulatory Visit (INDEPENDENT_AMBULATORY_CARE_PROVIDER_SITE_OTHER): Payer: Medicare Other | Admitting: Pharmacist Clinician (PhC)/ Clinical Pharmacy Specialist

## 2013-04-04 ENCOUNTER — Ambulatory Visit (HOSPITAL_COMMUNITY)
Admission: RE | Admit: 2013-04-04 | Discharge: 2013-04-04 | Disposition: A | Discharge status: Home / Self Care | Payer: Medicare Other | Source: Ambulatory Visit | Attending: Cardiovascular Disease | Admitting: Cardiovascular Disease

## 2013-04-04 VITALS — BP 118/78 | HR 72

## 2013-04-04 DIAGNOSIS — Z7901 Long term (current) use of anticoagulants: Secondary | ICD-10-CM

## 2013-04-04 DIAGNOSIS — R011 Cardiac murmur, unspecified: Secondary | ICD-10-CM | POA: Diagnosis not present

## 2013-04-04 DIAGNOSIS — R0600 Dyspnea, unspecified: Secondary | ICD-10-CM

## 2013-04-04 DIAGNOSIS — I4891 Unspecified atrial fibrillation: Secondary | ICD-10-CM

## 2013-04-04 DIAGNOSIS — R0609 Other forms of dyspnea: Secondary | ICD-10-CM | POA: Diagnosis not present

## 2013-04-04 DIAGNOSIS — R0989 Other specified symptoms and signs involving the circulatory and respiratory systems: Secondary | ICD-10-CM | POA: Insufficient documentation

## 2013-04-04 LAB — POCT INR: INR: 2

## 2013-04-04 NOTE — Progress Notes (Signed)
2D Echo Performed 04/04/2013    Schyler Counsell, RCS  

## 2013-04-05 ENCOUNTER — Telehealth: Payer: Self-pay | Admitting: *Deleted

## 2013-04-05 MED ORDER — FUROSEMIDE 40 MG PO TABS: 40.0000 mg | ORAL_TABLET | Freq: Every day | ORAL | Status: DC

## 2013-04-05 MED ORDER — POTASSIUM CHLORIDE CRYS ER 20 MEQ PO TBCR: 20.0000 meq | EXTENDED_RELEASE_TABLET | Freq: Every day | ORAL | Status: DC

## 2013-04-05 NOTE — Telephone Encounter (Signed)
Message copied by Vita Barley on Thu Apr 05, 2013  2:39 PM ------      Message from: Thurmon Fair      Created: Wed Apr 04, 2013  1:20 PM       Not much change from before. Mildly decreased LV EF, moderate Ao insufficiency, mildly dilated aortic root, all roughly the same. The study does suggest that he may need a little more diuretic. Probably best he stay on furosemide 40 mg every day ------

## 2013-04-05 NOTE — Telephone Encounter (Signed)
Patient notified of echo results. Will increase lasix to 40mg  daily and K+ to daily.  Patient voiced understanding.

## 2013-04-09 DIAGNOSIS — E291 Testicular hypofunction: Secondary | ICD-10-CM | POA: Diagnosis not present

## 2013-04-13 ENCOUNTER — Other Ambulatory Visit: Payer: Self-pay | Admitting: Cardiovascular Disease

## 2013-04-13 NOTE — Telephone Encounter (Signed)
Rx was sent to pharmacy electronically. 

## 2013-04-16 DIAGNOSIS — N529 Male erectile dysfunction, unspecified: Secondary | ICD-10-CM | POA: Diagnosis not present

## 2013-04-16 DIAGNOSIS — E291 Testicular hypofunction: Secondary | ICD-10-CM | POA: Diagnosis not present

## 2013-04-24 ENCOUNTER — Ambulatory Visit (INDEPENDENT_AMBULATORY_CARE_PROVIDER_SITE_OTHER): Payer: Medicare Other | Admitting: Family Medicine

## 2013-04-24 ENCOUNTER — Encounter: Payer: Self-pay | Admitting: Family Medicine

## 2013-04-24 ENCOUNTER — Telehealth: Payer: Self-pay | Admitting: Family Medicine

## 2013-04-24 VITALS — BP 112/70 | HR 81 | Temp 97.8°F | Ht 71.0 in | Wt 226.0 lb

## 2013-04-24 DIAGNOSIS — Z9189 Other specified personal risk factors, not elsewhere classified: Secondary | ICD-10-CM

## 2013-04-24 DIAGNOSIS — I10 Essential (primary) hypertension: Secondary | ICD-10-CM | POA: Diagnosis not present

## 2013-04-24 DIAGNOSIS — I509 Heart failure, unspecified: Secondary | ICD-10-CM

## 2013-04-24 DIAGNOSIS — I4891 Unspecified atrial fibrillation: Secondary | ICD-10-CM | POA: Diagnosis not present

## 2013-04-24 DIAGNOSIS — I5032 Chronic diastolic (congestive) heart failure: Secondary | ICD-10-CM | POA: Diagnosis not present

## 2013-04-24 DIAGNOSIS — E785 Hyperlipidemia, unspecified: Secondary | ICD-10-CM

## 2013-04-24 DIAGNOSIS — R609 Edema, unspecified: Secondary | ICD-10-CM

## 2013-04-24 DIAGNOSIS — E119 Type 2 diabetes mellitus without complications: Secondary | ICD-10-CM

## 2013-04-24 LAB — RENAL FUNCTION PANEL
BUN: 18 mg/dL (ref 6–23)
CO2: 28 mEq/L (ref 19–32)
Calcium: 10.1 mg/dL (ref 8.4–10.5)
Chloride: 104 mEq/L (ref 96–112)
Creat: 1.08 mg/dL (ref 0.50–1.35)
Glucose, Bld: 118 mg/dL — ABNORMAL HIGH (ref 70–99)
Sodium: 141 mEq/L (ref 135–145)

## 2013-04-24 NOTE — Telephone Encounter (Signed)
Lab order week of 06-19-2013 Labs prior to next visit lipid, renal, cbc, tsh, hepatic, hgba1c F/u DM, HTN, chf, edema

## 2013-04-24 NOTE — Progress Notes (Signed)
Patient ID: [name], male   DOB: 03/23/1951, 62 y.o.   MRN: [id number] [name] [id number] [birth year] 04/24/2013      Progress Note-Follow Up  Subjective  Chief Complaint  Chief Complaint  Patient presents with  . Follow-up    3 month    HPI  Patient is a 62 year old African American male who is in today for followup. Does note he's shortness of breath is improved since increasing his diuretic. Overall he feels fairly well. He is taking his medications as prescribed no recent illness. No headaches, fevers, chest pain, palpitations, shortness of breath, GI or GU concerns. Is following with [facility] who is managing his testosterone.  Past Medical History  Diagnosis Date  . Hypertension   . Hyperlipidemia   . Atrial fibrillation   . UNSPECIFIED ANEMIA 01/05/2010  . TESTICULAR HYPOFUNCTION 01/05/2010  . PULMONARY FUNCTION TESTS, ABNORMAL 02/02/2010  . PERIPHERAL NEUROPATHY 01/05/2010  . OBESITY NOS 01/25/2007  . NEUROMA 01/05/2010  . INSOMNIA, HX OF 01/25/2007  . HYPERTENSION 01/25/2007  . FATIGUE, CHRONIC 11/11/2009  . ERECTILE DYSFUNCTION 01/25/2007  . DYSPNEA 12/19/2009  . CHF 12/05/2009  . CHEST PAIN-UNSPECIFIED 11/11/2009  . BENIGN PROSTATIC HYPERTROPHY, HX OF 12/05/2009  . Atrial flutter 12/05/2009  . AORTIC STENOSIS, MODERATE 12/05/2009  . Knee pain, right 12/28/2010  . Hypoxia 05/05/2011  . Heart murmur   . SLEEP APNEA 04/06/2010    uses cpap  . DM 12/05/2009  . Superficial thrombophlebitis of left leg 09/07/2011  . Atypical chest pain 11/08/2011  . 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 Hyperkalemia 04/18/2012  . Edema 04/18/2012  . Debility 04/18/2012  . Toe pain, right 07/10/2012  . Gout 07/10/2012    Past Surgical History  Procedure Laterality Date  . Bilat foot surgery removed part of 5th metatarsal to corect curvature of toes    . Cardiac catheterization  10/16/2009    nonischemic cardiomyopathy    Family History  Problem Relation Age of Onset  . Clotting disorder  Mother   . Heart disease Mother     s/p MI  . Heart attack Mother   . Hypertension Mother   . Diabetes Mother   . Hyperlipidemia Mother   . Stroke Mother   . Cancer Father     ? lung  . Lung disease Father     smoker  . Diabetes Sister   . Hypertension Sister     smoker  . Leukemia Maternal Grandmother     ?  [name] Aneurysm Sister     brain  . Other Sister     clipped  . Seizures Sister     d/o w/aneurysm/ smoker    History   Social History  . Marital Status: Married    Spouse Name: N/A    Number of Children: N/A  . Years of Education: N/A   Occupational History  . Not on file.   Social History Main Topics  . Smoking status: Former Smoker -- 1.00 packs/day for 10 years    Quit date: 06/07/1980  . Smokeless tobacco: Never Used  . Alcohol Use: No  . Drug Use: No  . Sexual Activity: Not Currently   Other Topics Concern  . Not on file   Social History Narrative  . No narrative on file    Current Outpatient Prescriptions on File Prior to Visit  Medication Sig Dispense Refill  . albuterol (PROVENTIL HFA;VENTOLIN HFA) 108 (90 BASE) MCG/ACT inhaler Inhale 2 puffs into the lungs every 6 (  six) hours as needed for wheezing.  1 Inhaler  0  . allopurinol (ZYLOPRIM) 300 MG tablet Take 1 tablet (300 mg total) by mouth at bedtime.  30 tablet  2  . aspirin EC 81 MG tablet Take 81 mg by mouth daily.      . benazepril (LOTENSIN) 40 MG tablet Take 40 mg by mouth daily.      . calcium carbonate (TUMS - DOSED IN MG ELEMENTAL CALCIUM) 500 MG chewable tablet Chew 1 tablet by mouth daily as needed. For heartburn      . colchicine 0.6 MG tablet 1 tab po q 2 hours x 4 doses or pain relief today then 1 tab po q day  35 tablet  1  . fish oil-omega-3 fatty acids 1000 MG capsule Take 1 g by mouth daily.       . furosemide (LASIX) 40 MG tablet Take 1 tablet (40 mg total) by mouth daily.  30 tablet  5  . glyBURIDE (DIABETA) 5 MG tablet Take 1 tablet (5 mg total) by mouth daily with breakfast.   30 tablet  3  . HYDROcodone-acetaminophen (NORCO) 10-325 MG per tablet Take 1 tablet by mouth every 8 (eight) hours as needed for pain.  90 tablet  2  . LORazepam (ATIVAN) 1 MG tablet 1 tab po bid prn anxiety  60 tablet  2  . metFORMIN (GLUCOPHAGE) 1000 MG tablet Take 1 tablet (1,000 mg total) by mouth 2 (two) times daily with a meal.  60 tablet  5  . metoprolol (LOPRESSOR) 100 MG tablet Take 1 tablet (100 mg total) by mouth 2 (two) times daily.  60 tablet  3  . Multiple Vitamins-Minerals (MULTIVITAMIN PO) Take 1 tablet by mouth daily.      . naproxen (NAPROSYN) 375 MG tablet Take 1 tablet (375 mg total) by mouth daily as needed. Prn pain  30 tablet  2  . potassium chloride SA (KLOR-CON M20) 20 MEQ tablet Take 1 tablet (20 mEq total) by mouth daily.  30 tablet  5  . pravastatin (PRAVACHOL) 40 MG tablet TAKE ONE TABLET BY MOUTH AT BEDTIME  30 tablet  6  . temazepam (RESTORIL) 30 MG capsule Take 1 capsule (30 mg total) by mouth at bedtime as needed for sleep or anxiety.  30 capsule  5  . Testosterone (ANDROGEL) 20.25 MG/1.25GM (1.62%) GEL Place 4 Squirts onto the skin daily.       [name] TIKOSYN 500 MCG capsule TAKE 1 CAPSULE BY MOUTH EVERY 12 HOURS  60 capsule  6  . warfarin (COUMADIN) 4 MG tablet TAKE ONE TABLET BY MOUTH ONCE DAILY OR AS DIRECTED  90 tablet  1   No current facility-administered medications on file prior to visit.    Allergies  Allergen Reactions  . Sulfonamide Derivatives Other (See Comments)    unknown reaction  . Zolpidem     Excessive, prolonged sedation    Review of Systems  Review of Systems  Constitutional: Negative for fever and malaise/fatigue.  HENT: Negative for congestion.   Eyes: Negative for discharge.  Respiratory: Negative for shortness of breath.   Cardiovascular: Negative for chest pain, palpitations and leg swelling.  Gastrointestinal: Negative for nausea, abdominal pain and diarrhea.  Genitourinary: Negative for dysuria.  Musculoskeletal: Negative  for falls.  Skin: Negative for rash.  Neurological: Negative for loss of consciousness and headaches.  Endo/Heme/Allergies: Negative for polydipsia.  Psychiatric/Behavioral: Negative for depression and suicidal ideas. The patient is not nervous/anxious and does not  have insomnia.     Objective  BP 112/70  Pulse 81  Temp(Src) 97.8 F (36.6 C) (Oral)  Ht 5\' 11"  (1.803 m)  Wt 226 lb (102.513 kg)  BMI 31.53 kg/m2  SpO2 98%  Physical Exam  Physical Exam  Constitutional: He is oriented to person, place, and time and well-developed, well-nourished, and in no distress. No distress.  HENT:  Head: Normocephalic and atraumatic.  Eyes: Conjunctivae are normal.  Neck: Neck supple. No thyromegaly present.  Cardiovascular: Normal rate, regular rhythm and normal heart sounds.   No murmur heard. Pulmonary/Chest: Effort normal and breath sounds normal. No respiratory distress.  Abdominal: He exhibits no distension and no mass. There is no tenderness.  Musculoskeletal: He exhibits no edema.  Neurological: He is alert and oriented to person, place, and time.  Skin: Skin is warm.  Psychiatric: Memory, affect and judgment normal.    Lab Results  Component Value Date   TSH 1.200 01/31/2013   Lab Results  Component Value Date   WBC 3.9* 01/31/2013   HGB 14.2 01/31/2013   HCT 43.7 01/31/2013   MCV 76.5* 01/31/2013   PLT 184 01/31/2013   Lab Results  Component Value Date   CREATININE 1.14 03/20/2013   BUN 18 03/20/2013   NA 139 03/20/2013   K 5.0 03/20/2013   CL 104 03/20/2013   CO2 29 03/20/2013   Lab Results  Component Value Date   ALT 12 03/20/2013   AST 9 03/20/2013   ALKPHOS 41 03/20/2013   BILITOT 0.7 03/20/2013   Lab Results  Component Value Date   CHOL 145 01/31/2013   Lab Results  Component Value Date   HDL 32* 01/31/2013   Lab Results  Component Value Date   LDLCALC 86 01/31/2013   Lab Results  Component Value Date   TRIG 135 01/31/2013   Lab Results  Component  Value Date   CHOLHDL 4.5 01/31/2013     Assessment & Plan  HYPERTENSION Well controlled, no changes.  Atrial fibrillation, converted to SR after 2nd dose of Tikosyn. Well controlled on current meds, following closely with cardiology  Chronic diastolic congestive heart failure Improved SOB with increased diuretics.  INSOMNIA, HX OF Well controlled on temazepam.

## 2013-04-24 NOTE — Progress Notes (Signed)
Pre visit review using our clinic review tool, if applicable. No additional management support is needed unless otherwise documented below in the visit note. 

## 2013-04-24 NOTE — Patient Instructions (Signed)
Heart Failure °Heart failure is a condition in which the heart has trouble pumping blood. This means your heart does not pump blood efficiently for your body to work well. In some cases of heart failure, fluid may back up into your lungs or you may have swelling (edema) in your lower legs. Heart failure is usually a long-term (chronic) condition. It is important for you to take good care of yourself and follow your caregiver's treatment plan. °CAUSES  °Some health conditions can cause heart failure. Those health conditions include: °· High blood pressure (hypertension) causes the heart muscle to work harder than normal. When pressure in the blood vessels is high, the heart needs to pump (contract) with more force in order to circulate blood throughout the body. High blood pressure eventually causes the heart to become stiff and weak. °· Coronary artery disease (CAD) is the buildup of cholesterol and fat (plaque) in the arteries of the heart. The blockage in the arteries deprives the heart muscle of oxygen and blood. This can cause chest pain and may lead to a heart attack. High blood pressure can also contribute to CAD. °· Heart attack (myocardial infarction) occurs when 1 or more arteries in the heart become blocked. The loss of oxygen damages the muscle tissue of the heart. When this happens, part of the heart muscle dies. The injured tissue does not contract as well and weakens the heart's ability to pump blood. °· Abnormal heart valves can cause heart failure when the heart valves do not open and close properly. This makes the heart muscle pump harder to keep the blood flowing. °· Heart muscle disease (cardiomyopathy or myocarditis) is damage to the heart muscle from a variety of causes. These can include drug or alcohol abuse, infections, or unknown reasons. These can increase the risk of heart failure. °· Lung disease makes the heart work harder because the lungs do not work properly. This can cause a strain  on the heart, leading it to fail. °· Diabetes increases the risk of heart failure. High blood sugar contributes to high fat (lipid) levels in the blood. Diabetes can also cause slow damage to tiny blood vessels that carry important nutrients to the heart muscle. When the heart does not get enough oxygen and food, it can cause the heart to become weak and stiff. This leads to a heart that does not contract efficiently. °· Other conditions can contribute to heart failure. These include abnormal heart rhythms, thyroid problems, and low blood counts (anemia). °Certain unhealthy behaviors can increase the risk of heart failure. Those unhealthy behaviors include: °· Being overweight. °· Smoking or chewing tobacco. °· Eating foods high in fat and cholesterol. °· Abusing illicit drugs or alcohol. °· Lacking physical activity. °SYMPTOMS  °Heart failure symptoms may vary and can be hard to detect. Symptoms may include: °· Shortness of breath with activity, such as climbing stairs. °· Persistent cough. °· Swelling of the feet, ankles, legs, or abdomen. °· Unexplained weight gain. °· Difficulty breathing when lying flat (orthopnea). °· Waking from sleep because of the need to sit up and get more air. °· Rapid heartbeat. °· Fatigue and loss of energy. °· Feeling lightheaded, dizzy, or close to fainting. °· Loss of appetite. °· Nausea. °· Increased urination during the night (nocturia). °DIAGNOSIS  °A diagnosis of heart failure is based on your history, symptoms, physical examination, and diagnostic tests. °Diagnostic tests for heart failure may include: °· Echocardiography. °· Electrocardiography. °· Chest X-ray. °· Blood tests. °· Exercise   stress test. °· Cardiac angiography. °· Radionuclide scans. °TREATMENT  °Treatment is aimed at managing the symptoms of heart failure. Medicines, behavioral changes, or surgical intervention may be necessary to treat heart failure. °· Medicines to help treat heart failure may  include: °· Angiotensin-converting enzyme (ACE) inhibitors. This type of medicine blocks the effects of a blood protein called angiotensin-converting enzyme. ACE inhibitors relax (dilate) the blood vessels and help lower blood pressure. °· Angiotensin receptor blockers. This type of medicine blocks the actions of a blood protein called angiotensin. Angiotensin receptor blockers dilate the blood vessels and help lower blood pressure. °· Water pills (diuretics). Diuretics cause the kidneys to remove salt and water from the blood. The extra fluid is removed through urination. This loss of extra fluid lowers the volume of blood the heart pumps. °· Beta blockers. These prevent the heart from beating too fast and improve heart muscle strength. °· Digitalis. This increases the force of the heartbeat. °· Healthy behavior changes include: °· Obtaining and maintaining a healthy weight. °· Stopping smoking or chewing tobacco. °· Eating heart healthy foods. °· Limiting or avoiding alcohol. °· Stopping illicit drug use. °· Physical activity as directed by your caregiver. °· Surgical treatment for heart failure may include: °· A procedure to open blocked arteries, repair damaged heart valves, or remove damaged heart muscle tissue. °· A pacemaker to improve heart muscle function and control certain abnormal heart rhythms. °· An internal cardioverter defibrillator to treat certain serious abnormal heart rhythms. °· A left ventricular assist device to assist the pumping ability of the heart. °HOME CARE INSTRUCTIONS  °· Take your medicine as directed by your caregiver. Medicines are important in reducing the workload of your heart, slowing the progression of heart failure, and improving your symptoms. °· Do not stop taking your medicine unless directed by your caregiver. °· Do not skip any dose of medicine. °· Refill your prescriptions before you run out of medicine. Your medicines are needed every day. °· Take over-the-counter  medicine only as directed by your caregiver or pharmacist. °· Engage in moderate physical activity if directed by your caregiver. Moderate physical activity can benefit some people. The elderly and people with severe heart failure should consult with a caregiver for physical activity recommendations. °· Eat heart healthy foods. Food choices should be free of trans fat and low in saturated fat, cholesterol, and salt (sodium). Healthy choices include fresh or frozen fruits and vegetables, fish, lean meats, legumes, fat-free or low-fat dairy products, and whole grain or high fiber foods. Talk to a dietitian to learn more about heart healthy foods. °· Limit sodium if directed by your caregiver. Sodium restriction may reduce symptoms of heart failure in some people. Talk to a dietitian to learn more about heart healthy seasonings. °· Use healthy cooking methods. Healthy cooking methods include roasting, grilling, broiling, baking, poaching, steaming, or stir-frying. Talk to a dietitian to learn more about healthy cooking methods. °· Limit fluids if directed by your caregiver. Fluid restriction may reduce symptoms of heart failure in some people. °· Weigh yourself every day. Daily weights are important in the early recognition of excess fluid. You should weigh yourself every morning after you urinate and before you eat breakfast. Wear the same amount of clothing each time you weigh yourself. Record your daily weight. Provide your caregiver with your weight record. °· Monitor and record your blood pressure if directed by your caregiver. °· Check your pulse if directed by your caregiver. °· Lose weight if directed   by your caregiver. Weight loss may reduce symptoms of heart failure in some people. °· Stop smoking or chewing tobacco. Nicotine makes your heart work harder by causing your blood vessels to constrict. Do not use nicotine gum or patches before talking to your caregiver. °· Schedule and attend follow-up visits as  directed by your caregiver. It is important to keep all your appointments. °· Limit alcohol intake to no more than 1 drink per day for nonpregnant women and 2 drinks per day for men. Drinking more than that is harmful to your heart. Tell your caregiver if you drink alcohol several times a week. Talk with your caregiver about whether alcohol is safe for you. If your heart has already been damaged by alcohol or you have severe heart failure, drinking alcohol should be stopped completely. °· Stop illicit drug use. °· Stay up-to-date with immunizations. It is especially important to prevent respiratory infections through current pneumococcal and influenza immunizations. °· Manage other health conditions such as hypertension, diabetes, thyroid disease, or abnormal heart rhythms as directed by your caregiver. °· Learn to manage stress. °· Plan rest periods when fatigued. °· Learn strategies to manage high temperatures. If the weather is extremely hot: °· Avoid vigorous physical activity. °· Use air conditioning or fans or seek a cooler location. °· Avoid caffeine and alcohol. °· Wear loose-fitting, lightweight, and light-colored clothing. °· Learn strategies to manage cold temperatures. If the weather is extremely cold: °· Avoid vigorous physical activity. °· Layer clothes. °· Wear mittens or gloves, a hat, and a scarf when going outside. °· Avoid alcohol. °· Obtain ongoing education and support as needed. °· Participate or seek rehabilitation as needed to maintain or improve independence and quality of life. °SEEK MEDICAL CARE IF:  °· Your weight increases by 03 lb/1.4 kg in 1 day or 05 lb/2.3 kg in a week. °· You have increasing shortness of breath that is unusual for you. °· You are unable to participate in your usual physical activities. °· You tire easily. °· You cough more than normal, especially with physical activity. °· You have any or more swelling in areas such as your hands, feet, ankles, or abdomen. °· You  are unable to sleep because it is hard to breathe. °· You feel like your heart is beating fast (palpitations). °· You become dizzy or lightheaded upon standing up. °SEEK IMMEDIATE MEDICAL CARE IF:  °· You have difficulty breathing. °· There is a change in mental status such as decreased alertness or difficulty with concentration. °· You have a pain or discomfort in your chest. °· You have an episode of fainting (syncope). °MAKE SURE YOU:  °· Understand these instructions. °· Will watch your condition. °· Will get help right away if you are not doing well or get worse. °Document Released: 05/24/2005 Document Revised: 09/18/2012 Document Reviewed: 06/15/2012 °[employer]® Patient Information ©2014 [employer]. ° °

## 2013-04-24 NOTE — Telephone Encounter (Signed)
Lab order placed.

## 2013-04-29 NOTE — Assessment & Plan Note (Signed)
Well-controlled on temazepam. 

## 2013-04-29 NOTE — Assessment & Plan Note (Signed)
Well controlled, no changes 

## 2013-04-29 NOTE — Assessment & Plan Note (Signed)
Improved SOB with increased diuretics.

## 2013-04-29 NOTE — Assessment & Plan Note (Signed)
Well controlled on current meds, following closely with cardiology

## 2013-05-13 ENCOUNTER — Other Ambulatory Visit: Payer: Self-pay | Admitting: Family Medicine

## 2013-05-15 ENCOUNTER — Ambulatory Visit (INDEPENDENT_AMBULATORY_CARE_PROVIDER_SITE_OTHER): Payer: Medicare Other | Admitting: Cardiovascular Disease

## 2013-05-15 ENCOUNTER — Encounter: Payer: Self-pay | Admitting: Cardiovascular Disease

## 2013-05-15 ENCOUNTER — Ambulatory Visit (INDEPENDENT_AMBULATORY_CARE_PROVIDER_SITE_OTHER): Payer: Medicare Other | Admitting: Pharmacist Clinician (PhC)/ Clinical Pharmacy Specialist

## 2013-05-15 VITALS — BP 132/74 | HR 80 | Resp 20 | Ht 71.0 in | Wt 226.5 lb

## 2013-05-15 DIAGNOSIS — I4892 Unspecified atrial flutter: Secondary | ICD-10-CM

## 2013-05-15 DIAGNOSIS — I4891 Unspecified atrial fibrillation: Secondary | ICD-10-CM

## 2013-05-15 DIAGNOSIS — M109 Gout, unspecified: Secondary | ICD-10-CM | POA: Diagnosis not present

## 2013-05-15 DIAGNOSIS — G4730 Sleep apnea, unspecified: Secondary | ICD-10-CM

## 2013-05-15 DIAGNOSIS — Z7901 Long term (current) use of anticoagulants: Secondary | ICD-10-CM | POA: Diagnosis not present

## 2013-05-15 DIAGNOSIS — I5032 Chronic diastolic (congestive) heart failure: Secondary | ICD-10-CM | POA: Diagnosis not present

## 2013-05-15 DIAGNOSIS — I359 Nonrheumatic aortic valve disorder, unspecified: Secondary | ICD-10-CM

## 2013-05-15 DIAGNOSIS — I351 Nonrheumatic aortic (valve) insufficiency: Secondary | ICD-10-CM

## 2013-05-15 DIAGNOSIS — I509 Heart failure, unspecified: Secondary | ICD-10-CM

## 2013-05-15 LAB — POCT INR: INR: 1.9

## 2013-05-15 NOTE — Assessment & Plan Note (Signed)
He again reports 100% compliance with CPAP therapy

## 2013-05-15 NOTE — Assessment & Plan Note (Signed)
A recent episode of acute decompensation has improved with diuretic therapy. Unfortunately this has led to a gout attack. He is reminded about the importance of sodium restriction, daily weight monitoring. In the long run I think he would benefit from sustained attempts at weight loss and we discussed calorie restriction and physical exercise. He cannot exercise a lot because of severe knee arthritis. In fact right knee replacement has been recommended.

## 2013-05-15 NOTE — Assessment & Plan Note (Addendum)
He has only very brief palpitations and no sustained document atrial fibrillation since we started treatment with Tykosin. He tolerates AF poorly, probably due to significant diastolic dysfunction. 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  Is relatively high risk of embolic events should continue warfarin anticoagulation without interruption.

## 2013-05-15 NOTE — Progress Notes (Signed)
Patient ID: [name], male   DOB: 10/16/1950, 62 y.o.   MRN: [id number]     Reason for office visit Congestive heart failure, atrial fibrillation, hypertension, obstructive sleep apnea, aortic insufficiency  [name] returns for followup after an echocardiogram showed evidence of acute volume overload. After increasing the dose of his diuretic his shortness of breath has substantially improved, but he has also developed gout. Recently he has had 3 separate attacks of gout.  He does not have orthopnea or paroxysmal nocturnal dyspnea and does not have edema. He seems to be back to his chronic pattern of NYHA functional class II dyspnea on exertion. His echo showed that left ventricular systolic function is slightly depressed with an EF of 45-50%. He has moderate aortic insufficiency which appears to be secondary to mild dilatation of the aortic root. He has a long-standing history of severe hypertension which has not always been well treated. He also has long-standing history of obstructive sleep apnea which is also not treated until recently. Coronary angiography 2011 did not show evidence of significant stenoses. He has recurrent paroxysmal atrial fibrillation with a good response to treatment with dofetilide.    Allergies  Allergen Reactions  . Sulfonamide Derivatives Other (See Comments)    unknown reaction  . Zolpidem     Excessive, prolonged sedation    Current Outpatient Prescriptions  Medication Sig Dispense Refill  . albuterol (PROVENTIL HFA;VENTOLIN HFA) 108 (90 BASE) MCG/ACT inhaler Inhale 2 puffs into the lungs every 6 (six) hours as needed for wheezing.  1 Inhaler  0  . allopurinol (ZYLOPRIM) 300 MG tablet Take 1 tablet (300 mg total) by mouth at bedtime.  30 tablet  2  . aspirin EC 81 MG tablet Take 81 mg by mouth daily.      . benazepril (LOTENSIN) 40 MG tablet Take 40 mg by mouth daily.      . calcium carbonate (TUMS - DOSED IN MG ELEMENTAL CALCIUM) 500 MG chewable  tablet Chew 1 tablet by mouth daily as needed. For heartburn      . colchicine (COLCRYS) 0.6 MG tablet Daily      . fish oil-omega-3 fatty acids 1000 MG capsule Take 1 g by mouth daily.       . furosemide (LASIX) 40 MG tablet Take 1 tablet (40 mg total) by mouth daily.  30 tablet  5  . glyBURIDE (DIABETA) 5 MG tablet Take 1 tablet (5 mg total) by mouth daily with breakfast.  30 tablet  3  . HYDROcodone-acetaminophen (NORCO) 10-325 MG per tablet Take 1 tablet by mouth every 8 (eight) hours as needed for pain.  90 tablet  2  . LORazepam (ATIVAN) 1 MG tablet 1 tab po bid prn anxiety  60 tablet  2  . metFORMIN (GLUCOPHAGE) 1000 MG tablet Take 1 tablet (1,000 mg total) by mouth 2 (two) times daily with a meal.  60 tablet  5  . metoprolol (LOPRESSOR) 100 MG tablet Take 1 tablet (100 mg total) by mouth 2 (two) times daily.  60 tablet  3  . Multiple Vitamins-Minerals (MULTIVITAMIN PO) Take 1 tablet by mouth daily.      . naproxen (NAPROSYN) 375 MG tablet Take 1 tablet (375 mg total) by mouth daily as needed. Prn pain  30 tablet  2  . potassium chloride SA (KLOR-CON M20) 20 MEQ tablet Take 1 tablet (20 mEq total) by mouth daily.  30 tablet  5  . pravastatin (PRAVACHOL) 40 MG tablet TAKE ONE  TABLET BY MOUTH AT BEDTIME  30 tablet  6  . temazepam (RESTORIL) 30 MG capsule Take 1 capsule (30 mg total) by mouth at bedtime as needed for sleep or anxiety.  30 capsule  5  . Testosterone (ANDROGEL) 20.25 MG/1.25GM (1.62%) GEL Place 4 Squirts onto the skin daily.       [name] TIKOSYN 500 MCG capsule TAKE 1 CAPSULE BY MOUTH EVERY 12 HOURS  60 capsule  6  . warfarin (COUMADIN) 4 MG tablet TAKE ONE TABLET BY MOUTH ONCE DAILY OR AS DIRECTED  90 tablet  1   No current facility-administered medications for this visit.    Past Medical History  Diagnosis Date  . Hypertension   . Hyperlipidemia   . Atrial fibrillation   . UNSPECIFIED ANEMIA 01/05/2010  . TESTICULAR HYPOFUNCTION 01/05/2010  . PULMONARY FUNCTION TESTS, ABNORMAL  02/02/2010  . PERIPHERAL NEUROPATHY 01/05/2010  . OBESITY NOS 01/25/2007  . NEUROMA 01/05/2010  . INSOMNIA, HX OF 01/25/2007  . HYPERTENSION 01/25/2007  . FATIGUE, CHRONIC 11/11/2009  . ERECTILE DYSFUNCTION 01/25/2007  . DYSPNEA 12/19/2009  . CHF 12/05/2009  . CHEST PAIN-UNSPECIFIED 11/11/2009  . BENIGN PROSTATIC HYPERTROPHY, HX OF 12/05/2009  . Atrial flutter 12/05/2009  . AORTIC STENOSIS, MODERATE 12/05/2009  . Knee pain, right 12/28/2010  . Hypoxia 05/05/2011  . Heart murmur   . SLEEP APNEA 04/06/2010    uses cpap  . DM 12/05/2009  . Superficial thrombophlebitis of left leg 09/07/2011  . Atypical chest pain 11/08/2011  . 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 Hyperkalemia 04/18/2012  . Edema 04/18/2012  . Debility 04/18/2012  . Toe pain, right 07/10/2012  . Gout 07/10/2012    Past Surgical History  Procedure Laterality Date  . Bilat foot surgery removed part of 5th metatarsal to corect curvature of toes    . Cardiac catheterization  10/16/2009    nonischemic cardiomyopathy    Family History  Problem Relation Age of Onset  . Clotting disorder Mother   . Heart disease Mother     s/p MI  . Heart attack Mother   . Hypertension Mother   . Diabetes Mother   . Hyperlipidemia Mother   . Stroke Mother   . Cancer Father     ? lung  . Lung disease Father     smoker  . Diabetes Sister   . Hypertension Sister     smoker  . Leukemia Maternal Grandmother     ?  [name] Aneurysm Sister     brain  . Other Sister     clipped  . Seizures Sister     d/o w/aneurysm/ smoker    History   Social History  . Marital Status: Married    Spouse Name: N/A    Number of Children: N/A  . Years of Education: N/A   Occupational History  . Not on file.   Social History Main Topics  . Smoking status: Former Smoker -- 1.00 packs/day for 10 years    Quit date: 06/07/1980  . Smokeless tobacco: Never Used  . Alcohol Use: No  . Drug Use: No  . Sexual Activity: Not Currently   Other Topics Concern  . Not on file   Social  History Narrative  . No narrative on file    Review of systems: He has active gout in his left great toe. It is improving on treatment with colchicine. The patient specifically denies any chest pain at rest or with exertion, dyspnea at rest, orthopnea, paroxysmal nocturnal dyspnea, syncope,focal  neurological deficits, intermittent claudication, lower extremity edema, cough, hemoptysis or wheezing.  The patient also denies abdominal pain, nausea, vomiting, dysphagia, diarrhea, constipation, polyuria, polydipsia, dysuria, hematuria, frequency, urgency, abnormal bleeding or bruising, fever, chills, unexpected weight changes, mood swings, change in skin or hair texture, change in voice quality, auditory or visual problems, allergic reactions or rashes, new musculoskeletal complaints other than usual "aches and pains"   PHYSICAL EXAM BP 132/74  Pulse 80  Resp 20  Ht 5\' 11"  (1.803 m)  Wt 226 lb 8 oz (102.74 kg)  BMI 31.60 kg/m2 General: Alert, oriented x3, no distress  Head: no evidence of trauma, PERRL, EOMI, no exophtalmos or lid lag, no myxedema, no xanthelasma; normal ears, nose and crowded oropharynx  Neck: normal jugular venous pulsations and no hepatojugular reflux; brisk carotid pulses without delay and no carotid bruits  Chest: clear to auscultation, no signs of consolidation by percussion or palpation, normal fremitus, symmetrical and full respiratory excursions  Cardiovascular: normal position and quality of the apical impulse, regular rhythm, normal first and second heart sounds, loud fourth heart sound, grade 1-2/6 decrescendo diastolic murmur at the left lower sternal border  Abdomen: no tenderness or distention, no masses by palpation, no abnormal pulsatility or arterial bruits, normal bowel sounds, no hepatosplenomegaly  Extremities: no clubbing, cyanosis or edema; 2+ radial, ulnar and brachial pulses bilaterally; 2+ right femoral, posterior tibial and dorsalis pedis pulses; 2+ left  femoral, posterior tibial and dorsalis pedis pulses; no subclavian or femoral bruits  Neurological: grossly nonfocal   EKG: Sinus rhythm, nonspecific intraventricular conduction delay with rightward axis, inferior and lateral T-wave inversion, unchanged from previous tracings, suspect primarily related to LVH QTC 448 ms  His last hemoglobin A1c was 6.6%  Lipid Panel     Component Value Date/Time   CHOL 145 01/31/2013 1128   TRIG 135 01/31/2013 1128   HDL 32* 01/31/2013 1128   CHOLHDL 4.5 01/31/2013 1128   VLDL 27 01/31/2013 1128   LDLCALC 86 01/31/2013 1128    BMET    Component Value Date/Time   NA 141 04/24/2013 1045   K 5.0 04/24/2013 1045   CL 104 04/24/2013 1045   CO2 28 04/24/2013 1045   GLUCOSE 118* 04/24/2013 1045   BUN 18 04/24/2013 1045   CREATININE 1.08 04/24/2013 1045   CREATININE 1.2 06/22/2012 0955   CALCIUM 10.1 04/24/2013 1045   GFRNONAA 66* 08/05/2011 0700   GFRAA 77* 08/05/2011 0700     ASSESSMENT AND PLAN Chronic diastolic congestive heart failure A recent episode of acute decompensation has improved with diuretic therapy. Unfortunately this has led to a gout attack. He is reminded about the importance of sodium restriction, daily weight monitoring. In the long run I think he would benefit from sustained attempts at weight loss and we discussed calorie restriction and physical exercise. He cannot exercise a lot because of severe knee arthritis. In fact right knee replacement has been recommended.  Atrial fibrillation, converted to SR after 2nd dose of Tikosyn. He has only very brief palpitations and no sustained document atrial fibrillation since we started treatment with Tykosin. He tolerates AF poorly, probably due to significant diastolic dysfunction. 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  Is relatively high risk of embolic events should continue warfarin anticoagulation without interruption.  Gout  attack Discussed the fact that acute gout attacks are often triggered by diuretic therapy, increased dose of diuretics or any type of fluid shift. We discussed healthy changes in  his diet with reduction in consumption of animal products, especially organ meats. Call Ephriam Knuckles be used prophylactically when diuretic dose changes are made to help prevent exacerbations. I do not have a recent uric acid level but he does take allopurinol.  Moderate aortic insufficiency Do nothing this severe enough to cause congestive heart failure. I think his cardiomyopathy is primarily related to systemic hypertension that wasn't sufficiently treated for most of his life time. The hypertension is also likely the cause of his dilated aortic root in turn the cause for his aortic valve insufficiency. The most important intervention is careful treatment of his high blood pressure.  SLEEP APNEA He again reports 100% compliance with CPAP therapy   Orders Placed This Encounter  Procedures  . Electrocardiogram report   Meds ordered this encounter  Medications  . colchicine (COLCRYS) 0.6 MG tablet    Sig: Daily    Ariyona Eid  Thurmon Fair, MD, [facility] [phone] office [phone] pager

## 2013-05-15 NOTE — Patient Instructions (Signed)
Your physician recommends that you schedule a follow-up appointment in: 6 months with Dr.Croitoru    

## 2013-05-15 NOTE — Assessment & Plan Note (Signed)
Do nothing this severe enough to cause congestive heart failure. I think his cardiomyopathy is primarily related to systemic hypertension that wasn't sufficiently treated for most of his life time. The hypertension is also likely the cause of his dilated aortic root in turn the cause for his aortic valve insufficiency. The most important intervention is careful treatment of his high blood pressure.

## 2013-05-15 NOTE — Assessment & Plan Note (Signed)
Discussed the fact that acute gout attacks are often triggered by diuretic therapy, increased dose of diuretics or any type of fluid shift. We discussed healthy changes in his diet with reduction in consumption of animal products, especially organ meats. Call Ephriam Knuckles be used prophylactically when diuretic dose changes are made to help prevent exacerbations. I do not have a recent uric acid level but he does take allopurinol.

## 2013-05-16 ENCOUNTER — Encounter: Payer: Self-pay | Admitting: Cardiovascular Disease

## 2013-06-05 ENCOUNTER — Other Ambulatory Visit: Payer: Self-pay | Admitting: Family Medicine

## 2013-06-05 NOTE — Telephone Encounter (Signed)
RX done. 

## 2013-06-05 NOTE — Telephone Encounter (Signed)
Rx printed and forwarded to PRovider for signature.  Medication name:  Name from pharmacy:  LORazepam (ATIVAN) 1 MG tablet  LORazepam 1MG  TAB Sig: TAKE ONE TABLET BY MOUTH TWICE DAILY AS NEEDED FOR ANXIETY Dispense: 60 tablet Refills: 0 Start: 06/05/2013 Class: Normal Requested on: 11/27/2012 Originally ordered on: 01/28/2012 Last refill: 04/26/2013

## 2013-06-26 ENCOUNTER — Ambulatory Visit (INDEPENDENT_AMBULATORY_CARE_PROVIDER_SITE_OTHER): Payer: Medicare Other | Admitting: Pharmacist Clinician (PhC)/ Clinical Pharmacy Specialist

## 2013-06-26 ENCOUNTER — Ambulatory Visit: Payer: Medicare Other | Admitting: Family Medicine

## 2013-06-26 VITALS — BP 132/76 | HR 72

## 2013-06-26 DIAGNOSIS — I4891 Unspecified atrial fibrillation: Secondary | ICD-10-CM | POA: Diagnosis not present

## 2013-06-26 DIAGNOSIS — Z5181 Encounter for therapeutic drug level monitoring: Secondary | ICD-10-CM | POA: Diagnosis not present

## 2013-06-26 DIAGNOSIS — Z7901 Long term (current) use of anticoagulants: Secondary | ICD-10-CM

## 2013-06-26 LAB — POCT INR: INR: 2

## 2013-07-04 ENCOUNTER — Other Ambulatory Visit: Payer: Self-pay | Admitting: Cardiovascular Disease

## 2013-07-04 ENCOUNTER — Other Ambulatory Visit: Payer: Self-pay | Admitting: Family Medicine

## 2013-07-04 NOTE — Telephone Encounter (Signed)
Rx was sent to pharmacy electronically. 

## 2013-07-11 DIAGNOSIS — I10 Essential (primary) hypertension: Secondary | ICD-10-CM | POA: Diagnosis not present

## 2013-07-11 DIAGNOSIS — E119 Type 2 diabetes mellitus without complications: Secondary | ICD-10-CM | POA: Diagnosis not present

## 2013-07-11 DIAGNOSIS — I509 Heart failure, unspecified: Secondary | ICD-10-CM | POA: Diagnosis not present

## 2013-07-11 DIAGNOSIS — R609 Edema, unspecified: Secondary | ICD-10-CM | POA: Diagnosis not present

## 2013-07-11 DIAGNOSIS — E785 Hyperlipidemia, unspecified: Secondary | ICD-10-CM | POA: Diagnosis not present

## 2013-07-11 DIAGNOSIS — I5032 Chronic diastolic (congestive) heart failure: Secondary | ICD-10-CM | POA: Diagnosis not present

## 2013-07-11 LAB — LIPID PANEL
Cholesterol: 131 mg/dL (ref 0–200)
HDL: 33 mg/dL — ABNORMAL LOW (ref >39)
LDL Cholesterol: 79 mg/dL (ref 0–99)
Total CHOL/HDL Ratio: 4 Ratio
Triglycerides: 94 mg/dL (ref <150)
VLDL: 19 mg/dL (ref 0–40)

## 2013-07-11 LAB — CBC
HCT: 44.5 % (ref 39.0–52.0)
Hemoglobin: 14.8 g/dL (ref 13.0–17.0)
MCH: 25.6 pg — ABNORMAL LOW (ref 26.0–34.0)
MCHC: 33.3 g/dL (ref 30.0–36.0)
MCV: 77.1 fL — ABNORMAL LOW (ref 78.0–100.0)
Platelets: 222 10*3/uL (ref 150–400)
RBC: 5.77 MIL/uL (ref 4.22–5.81)
RDW: 16.3 % — ABNORMAL HIGH (ref 11.5–15.5)
WBC: 4.2 10*3/uL (ref 4.0–10.5)

## 2013-07-11 LAB — HEPATIC FUNCTION PANEL
ALT: 19 U/L (ref 0–53)
AST: 19 U/L (ref 0–37)
Albumin: 4.4 g/dL (ref 3.5–5.2)
Alkaline Phosphatase: 37 U/L — ABNORMAL LOW (ref 39–117)
Bilirubin, Direct: 0.1 mg/dL (ref 0.0–0.3)
Indirect Bilirubin: 0.4 mg/dL (ref 0.2–1.2)
Total Bilirubin: 0.5 mg/dL (ref 0.2–1.2)
Total Protein: 7.3 g/dL (ref 6.0–8.3)

## 2013-07-11 LAB — RENAL FUNCTION PANEL
Albumin: 4.4 g/dL (ref 3.5–5.2)
BUN: 15 mg/dL (ref 6–23)
CO2: 27 mEq/L (ref 19–32)
Calcium: 10 mg/dL (ref 8.4–10.5)
Chloride: 106 mEq/L (ref 96–112)
Creat: 1.08 mg/dL (ref 0.50–1.35)
Glucose, Bld: 113 mg/dL — ABNORMAL HIGH (ref 70–99)
Phosphorus: 2.9 mg/dL (ref 2.3–4.6)
Potassium: 4.9 mEq/L (ref 3.5–5.3)
Sodium: 144 mEq/L (ref 135–145)

## 2013-07-11 LAB — HEMOGLOBIN A1C
Hgb A1c MFr Bld: 7 % — ABNORMAL HIGH (ref <5.7)
Mean Plasma Glucose: 154 mg/dL — ABNORMAL HIGH (ref <117)

## 2013-07-11 LAB — TSH: TSH: 1.139 u[IU]/mL (ref 0.350–4.500)

## 2013-07-19 ENCOUNTER — Other Ambulatory Visit: Payer: Self-pay | Admitting: Family Medicine

## 2013-07-25 ENCOUNTER — Telehealth: Payer: Self-pay

## 2013-07-25 NOTE — Telephone Encounter (Signed)
Please get a pa started for temazepam.  Pt states he has tried Ambien (leaves him "doped up" until next day)

## 2013-07-27 NOTE — Telephone Encounter (Signed)
Spoke with pharmacist and she states rx was previously filled on 07/01/13 and he should be able to get refill on 07/28/13. Notified pt and he states he is not out and was not requesting refill that insurance company sent him a letter that PA was required. Advised him at this time we were told by his insurance that PA will not be required.

## 2013-07-27 NOTE — Telephone Encounter (Signed)
Pt does not need PA for temazepam per Pharmacy, states its just to soon for refill, not due 07/28/13

## 2013-07-31 ENCOUNTER — Ambulatory Visit: Payer: Medicare Other | Admitting: Family Medicine

## 2013-08-07 ENCOUNTER — Ambulatory Visit (INDEPENDENT_AMBULATORY_CARE_PROVIDER_SITE_OTHER): Payer: Medicare Other | Admitting: Pharmacist Clinician (PhC)/ Clinical Pharmacy Specialist

## 2013-08-07 VITALS — BP 130/78 | HR 68

## 2013-08-07 DIAGNOSIS — I4891 Unspecified atrial fibrillation: Secondary | ICD-10-CM

## 2013-08-07 DIAGNOSIS — Z7901 Long term (current) use of anticoagulants: Secondary | ICD-10-CM

## 2013-08-07 LAB — POCT INR: INR: 2.2

## 2013-08-09 ENCOUNTER — Other Ambulatory Visit: Payer: Self-pay | Admitting: Family Medicine

## 2013-08-09 NOTE — Telephone Encounter (Signed)
RX printed for MD to sign and then we will fax.  Last RX was done on 06-05-13 quantity 60 with 1 refill

## 2013-08-13 ENCOUNTER — Encounter: Payer: Self-pay | Admitting: Family Medicine

## 2013-08-13 ENCOUNTER — Ambulatory Visit (INDEPENDENT_AMBULATORY_CARE_PROVIDER_SITE_OTHER): Payer: Medicare Other | Admitting: Family Medicine

## 2013-08-13 VITALS — BP 128/80 | HR 77 | Temp 97.9°F | Ht 71.0 in | Wt 233.1 lb

## 2013-08-13 DIAGNOSIS — I10 Essential (primary) hypertension: Secondary | ICD-10-CM | POA: Diagnosis not present

## 2013-08-13 DIAGNOSIS — E669 Obesity, unspecified: Secondary | ICD-10-CM

## 2013-08-13 DIAGNOSIS — M25561 Pain in right knee: Secondary | ICD-10-CM

## 2013-08-13 DIAGNOSIS — M109 Gout, unspecified: Secondary | ICD-10-CM | POA: Diagnosis not present

## 2013-08-13 DIAGNOSIS — M25562 Pain in left knee: Secondary | ICD-10-CM

## 2013-08-13 DIAGNOSIS — M25569 Pain in unspecified knee: Secondary | ICD-10-CM

## 2013-08-13 DIAGNOSIS — G4700 Insomnia, unspecified: Secondary | ICD-10-CM | POA: Diagnosis not present

## 2013-08-13 DIAGNOSIS — E785 Hyperlipidemia, unspecified: Secondary | ICD-10-CM

## 2013-08-13 DIAGNOSIS — E119 Type 2 diabetes mellitus without complications: Secondary | ICD-10-CM

## 2013-08-13 LAB — URIC ACID: Uric Acid, Serum: 9.5 mg/dL — ABNORMAL HIGH (ref 4.0–7.8)

## 2013-08-13 MED ORDER — HYDROCODONE-ACETAMINOPHEN 10-325 MG PO TABS: 1.0000 | ORAL_TABLET | Freq: Three times a day (TID) | ORAL | Status: DC | PRN

## 2013-08-13 NOTE — Telephone Encounter (Signed)
Did he bring paperwork for PA that insurance sent him?

## 2013-08-13 NOTE — Telephone Encounter (Signed)
Pt is in today and states that he has to have a PA for this temazepam and that he just paid cash for this. Please see?

## 2013-08-13 NOTE — Patient Instructions (Signed)

## 2013-08-13 NOTE — Telephone Encounter (Signed)
He had a paper with him stating that they are only giving him a temporary supply of 90 tablets.

## 2013-08-13 NOTE — Telephone Encounter (Signed)
Ok I will call insurance.

## 2013-08-13 NOTE — Progress Notes (Signed)
Pre visit review using our clinic review tool, if applicable. No additional management support is needed unless otherwise documented below in the visit note. 

## 2013-08-16 NOTE — Telephone Encounter (Signed)
Lm on vm for patient to call back, need to know who his prescription insurance is through, awaiting call back

## 2013-08-17 DIAGNOSIS — F411 Generalized anxiety disorder: Secondary | ICD-10-CM | POA: Insufficient documentation

## 2013-08-17 NOTE — Telephone Encounter (Signed)
PA for received from [place], form forward to nurse

## 2013-08-19 NOTE — Progress Notes (Signed)
Patient ID: [name], male   DOB: 01-Jul-1950, 63 y.o.   MRN: [id number] BRALEN WILTGEN [id number] 1950/11/24 08/19/2013      Progress Note-Follow Up  Subjective  Chief Complaint  Chief Complaint  Patient presents with  . Follow-up    3 month    HPI  Patient is a 63 yo AA male in today for follow up. Doing fairly well. Had a recent gout flare in the left knee but was not taking his Allopurinol. It has calmed down now. No recent falls or injury. Is following with Dr Roni Bread of urology. Denies CP/palp/SOB/HA/congestion/fevers/GI or GU c/o. Taking meds as prescribed  Past Medical History  Diagnosis Date  . Hypertension   . Hyperlipidemia   . Atrial fibrillation   . UNSPECIFIED ANEMIA 01/05/2010  . TESTICULAR HYPOFUNCTION 01/05/2010  . PULMONARY FUNCTION TESTS, ABNORMAL 02/02/2010  . PERIPHERAL NEUROPATHY 01/05/2010  . OBESITY NOS 01/25/2007  . NEUROMA 01/05/2010  . INSOMNIA, HX OF 01/25/2007  . HYPERTENSION 01/25/2007  . FATIGUE, CHRONIC 11/11/2009  . ERECTILE DYSFUNCTION 01/25/2007  . DYSPNEA 12/19/2009  . CHF 12/05/2009  . CHEST PAIN-UNSPECIFIED 11/11/2009  . BENIGN PROSTATIC HYPERTROPHY, HX OF 12/05/2009  . Atrial flutter 12/05/2009  . AORTIC STENOSIS, MODERATE 12/05/2009  . Knee pain, right 12/28/2010  . Hypoxia 05/05/2011  . Heart murmur   . SLEEP APNEA 04/06/2010    uses cpap  . DM 12/05/2009  . Superficial thrombophlebitis of left leg 09/07/2011  . Atypical chest pain 11/08/2011  . 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 Hyperkalemia 04/18/2012  . Edema 04/18/2012  . Debility 04/18/2012  . Toe pain, right 07/10/2012  . Gout 07/10/2012    Past Surgical History  Procedure Laterality Date  . Bilat foot surgery removed part of 5th metatarsal to corect curvature of toes    . Cardiac catheterization  10/16/2009    nonischemic cardiomyopathy    Family History  Problem Relation Age of Onset  . Clotting disorder Mother   . Heart disease Mother     s/p MI  . Heart attack Mother   . Hypertension Mother    . Diabetes Mother   . Hyperlipidemia Mother   . Stroke Mother   . Cancer Father     ? lung  . Lung disease Father     smoker  . Diabetes Sister   . Hypertension Sister     smoker  . Leukemia Maternal Grandmother     ?  [name] Aneurysm Sister     brain  . Other Sister     clipped  . Seizures Sister     d/o w/aneurysm/ smoker    History   Social History  . Marital Status: Married    Spouse Name: N/A    Number of Children: N/A  . Years of Education: N/A   Occupational History  . Not on file.   Social History Main Topics  . Smoking status: Former Smoker -- 1.00 packs/day for 10 years    Quit date: 06/07/1980  . Smokeless tobacco: Never Used  . Alcohol Use: No  . Drug Use: No  . Sexual Activity: Not Currently   Other Topics Concern  . Not on file   Social History Narrative  . No narrative on file    Current Outpatient Prescriptions on File Prior to Visit  Medication Sig Dispense Refill  . albuterol (PROVENTIL HFA;VENTOLIN HFA) 108 (90 BASE) MCG/ACT inhaler Inhale 2 puffs into the lungs every 6 (six) hours as needed for wheezing.  1 Inhaler  0  . allopurinol (ZYLOPRIM) 300 MG tablet TAKE ONE TABLET BY MOUTH AT BEDTIME  30 tablet  0  . aspirin EC 81 MG tablet Take 81 mg by mouth daily.      . benazepril (LOTENSIN) 40 MG tablet Take 40 mg by mouth daily.      . calcium carbonate (TUMS - DOSED IN MG ELEMENTAL CALCIUM) 500 MG chewable tablet Chew 1 tablet by mouth daily as needed. For heartburn      . colchicine (COLCRYS) 0.6 MG tablet Daily      . COLCRYS 0.6 MG tablet TAKE ONE TABLET BY MOUTH EVERY TWO HOURS FOR FOUR DOSES OR PAIN RELIEF TODAY. THEN TAKE ONE TABLET BY MOUTH ONCE DAILY  35 tablet  0  . fish oil-omega-3 fatty acids 1000 MG capsule Take 1 g by mouth daily.       . furosemide (LASIX) 40 MG tablet Take 1 tablet (40 mg total) by mouth daily.  30 tablet  9  . glyBURIDE (DIABETA) 5 MG tablet TAKE ONE TABLET BY MOUTH ONCE DAILY WITH BREAKFAST  30 tablet  0  .  LORazepam (ATIVAN) 1 MG tablet TAKE ONE TABLET BY MOUTH TWICE DAILY AS NEEDED FOR ANXIETY  60 tablet  1  . metFORMIN (GLUCOPHAGE) 1000 MG tablet Take 1 tablet (1,000 mg total) by mouth 2 (two) times daily with a meal.  60 tablet  5  . metoprolol (LOPRESSOR) 100 MG tablet Take 1 tablet (100 mg total) by mouth 2 (two) times daily.  60 tablet  3  . Multiple Vitamins-Minerals (MULTIVITAMIN PO) Take 1 tablet by mouth daily.      . naproxen (NAPROSYN) 375 MG tablet TAKE ONE TABLET BY MOUTH ONCE DAILY AS NEEDED FOR PAIN  30 tablet  0  . potassium chloride SA (KLOR-CON M20) 20 MEQ tablet Take 1 tablet (20 mEq total) by mouth daily.  30 tablet  5  . pravastatin (PRAVACHOL) 40 MG tablet TAKE ONE TABLET BY MOUTH AT BEDTIME  30 tablet  6  . temazepam (RESTORIL) 30 MG capsule Take 1 capsule (30 mg total) by mouth at bedtime as needed for sleep or anxiety.  30 capsule  5  . Testosterone (ANDROGEL) 20.25 MG/1.25GM (1.62%) GEL Place 4 Squirts onto the skin daily.       [name] TIKOSYN 500 MCG capsule TAKE 1 CAPSULE BY MOUTH EVERY 12 HOURS  60 capsule  6  . warfarin (COUMADIN) 4 MG tablet TAKE ONE TABLET BY MOUTH ONCE DAILY OR AS DIRECTED  90 tablet  1   No current facility-administered medications on file prior to visit.    Allergies  Allergen Reactions  . Sulfonamide Derivatives Other (See Comments)    unknown reaction  . Zolpidem     Excessive, prolonged sedation    Review of Systems  Review of Systems  Constitutional: Negative for fever and malaise/fatigue.  HENT: Negative for congestion.   Eyes: Negative for discharge.  Respiratory: Negative for shortness of breath.   Cardiovascular: Negative for chest pain, palpitations and leg swelling.  Gastrointestinal: Negative for nausea, abdominal pain and diarrhea.  Genitourinary: Negative for dysuria.  Musculoskeletal: Negative for falls.  Skin: Negative for rash.  Neurological: Negative for loss of consciousness and headaches.  Endo/Heme/Allergies:  Negative for polydipsia.  Psychiatric/Behavioral: Negative for depression and suicidal ideas. The patient is not nervous/anxious and does not have insomnia.     Objective  BP 128/80  Pulse 77  Temp(Src) 97.9 F (36.6 C) (  Oral)  Ht 5\' 11"  (1.803 m)  Wt 233 lb 1.3 oz (105.724 kg)  BMI 32.52 kg/m2  SpO2 99%  Physical Exam  Physical Exam  Constitutional: He is oriented to person, place, and time and well-developed, well-nourished, and in no distress. No distress.  HENT:  Head: Normocephalic and atraumatic.  Eyes: Conjunctivae are normal.  Neck: Neck supple. No thyromegaly present.  Cardiovascular: Normal rate, regular rhythm and normal heart sounds.   No murmur heard. Pulmonary/Chest: Effort normal and breath sounds normal. No respiratory distress.  Abdominal: He exhibits no distension and no mass. There is no tenderness.  Musculoskeletal: He exhibits no edema.  Neurological: He is alert and oriented to person, place, and time.  Skin: Skin is warm.  Psychiatric: Memory, affect and judgment normal.    Lab Results  Component Value Date   TSH 1.139 07/11/2013   Lab Results  Component Value Date   WBC 4.2 07/11/2013   HGB 14.8 07/11/2013   HCT 44.5 07/11/2013   MCV 77.1* 07/11/2013   PLT 222 07/11/2013   Lab Results  Component Value Date   CREATININE 1.08 07/11/2013   BUN 15 07/11/2013   NA 144 07/11/2013   K 4.9 07/11/2013   CL 106 07/11/2013   CO2 27 07/11/2013   Lab Results  Component Value Date   ALT 19 07/11/2013   AST 19 07/11/2013   ALKPHOS 37* 07/11/2013   BILITOT 0.5 07/11/2013   Lab Results  Component Value Date   CHOL 131 07/11/2013   Lab Results  Component Value Date   HDL 33* 07/11/2013   Lab Results  Component Value Date   LDLCALC 79 07/11/2013   Lab Results  Component Value Date   TRIG 94 07/11/2013   Lab Results  Component Value Date   CHOLHDL 4.0 07/11/2013     Assessment & Plan  HYPERTENSION Well controlled, no changes to meds. Encouraged heart healthy diet such  as the DASH diet and exercise as tolerated.   OBESITY NOS Encouraged DASH diet, decrease po intake and increase exercise as tolerated. Needs 7-8 hours of sleep nightly. Avoid trans fats, eat small, frequent meals every 4-5 hours with lean proteins, complex carbs and healthy fats. Minimize simple carbs, GMO foods.  DM hgba1c 7.0, minimize simple carbs and continue current meds  Gout Uric acid up had a recent episode in left knee. Was not taking Allopurinol routinely but just restarted.   Hyperlipidemia Tolerating statin, encouraged heart healthy diet, avoid trans fats, minimize simple carbs and saturated fats. Increase exercise as tolerated

## 2013-08-19 NOTE — Assessment & Plan Note (Signed)
Uric acid up had a recent episode in left knee. Was not taking Allopurinol routinely but just restarted.

## 2013-08-19 NOTE — Assessment & Plan Note (Signed)
hgba1c 7.0, minimize simple carbs and continue current meds

## 2013-08-19 NOTE — Assessment & Plan Note (Signed)
Encouraged DASH diet, decrease po intake and increase exercise as tolerated. Needs 7-8 hours of sleep nightly. Avoid trans fats, eat small, frequent meals every 4-5 hours with lean proteins, complex carbs and healthy fats. Minimize simple carbs, GMO foods. 

## 2013-08-19 NOTE — Assessment & Plan Note (Signed)
Tolerating statin, encouraged heart healthy diet, avoid trans fats, minimize simple carbs and saturated fats. Increase exercise as tolerated 

## 2013-08-19 NOTE — Assessment & Plan Note (Signed)
Well controlled, no changes to meds. Encouraged heart healthy diet such as the DASH diet and exercise as tolerated.  °

## 2013-08-22 ENCOUNTER — Other Ambulatory Visit: Payer: Self-pay | Admitting: Family Medicine

## 2013-08-29 NOTE — Telephone Encounter (Signed)
Received approval from [facility] for temazepam good from 08/29/13 through 06/06/14.

## 2013-08-29 NOTE — Telephone Encounter (Signed)
Left detailed message on pharmacy voicemail re: approval and left message for pt to return my call.

## 2013-09-05 ENCOUNTER — Other Ambulatory Visit: Payer: Self-pay | Admitting: Cardiovascular Disease

## 2013-09-05 NOTE — Telephone Encounter (Signed)
Rx was sent to pharmacy electronically. 

## 2013-09-16 ENCOUNTER — Other Ambulatory Visit: Payer: Self-pay | Admitting: Family Medicine

## 2013-09-19 ENCOUNTER — Ambulatory Visit (INDEPENDENT_AMBULATORY_CARE_PROVIDER_SITE_OTHER): Payer: Medicare Other | Admitting: Pharmacist Clinician (PhC)/ Clinical Pharmacy Specialist

## 2013-09-19 VITALS — BP 130/64 | HR 72

## 2013-09-19 DIAGNOSIS — I4891 Unspecified atrial fibrillation: Secondary | ICD-10-CM

## 2013-09-19 DIAGNOSIS — Z7901 Long term (current) use of anticoagulants: Secondary | ICD-10-CM

## 2013-09-19 LAB — POCT INR: INR: 2.1

## 2013-09-24 ENCOUNTER — Telehealth: Payer: Self-pay | Admitting: Family Medicine

## 2013-09-24 DIAGNOSIS — G4700 Insomnia, unspecified: Secondary | ICD-10-CM

## 2013-09-24 DIAGNOSIS — E119 Type 2 diabetes mellitus without complications: Secondary | ICD-10-CM

## 2013-09-24 MED ORDER — METFORMIN HCL 1000 MG PO TABS: 1000.0000 mg | ORAL_TABLET | Freq: Two times a day (BID) | ORAL | Status: DC

## 2013-09-24 NOTE — Telephone Encounter (Signed)
Refill metformin

## 2013-10-08 ENCOUNTER — Telehealth: Payer: Self-pay

## 2013-10-08 DIAGNOSIS — D649 Anemia, unspecified: Secondary | ICD-10-CM | POA: Diagnosis not present

## 2013-10-08 DIAGNOSIS — E785 Hyperlipidemia, unspecified: Secondary | ICD-10-CM

## 2013-10-08 DIAGNOSIS — E119 Type 2 diabetes mellitus without complications: Secondary | ICD-10-CM

## 2013-10-08 DIAGNOSIS — I10 Essential (primary) hypertension: Secondary | ICD-10-CM | POA: Diagnosis not present

## 2013-10-08 LAB — RENAL FUNCTION PANEL
ALBUMIN: 4.2 g/dL (ref 3.5–5.2)
BUN: 19 mg/dL (ref 6–23)
CHLORIDE: 106 meq/L (ref 96–112)
CO2: 27 meq/L (ref 19–32)
Calcium: 9.5 mg/dL (ref 8.4–10.5)
Creat: 1.2 mg/dL (ref 0.50–1.35)
GLUCOSE: 131 mg/dL — AB (ref 70–99)
Phosphorus: 3.9 mg/dL (ref 2.3–4.6)
Potassium: 4.9 mEq/L (ref 3.5–5.3)
Sodium: 142 mEq/L (ref 135–145)

## 2013-10-08 LAB — LIPID PANEL
CHOLESTEROL: 128 mg/dL (ref 0–200)
HDL: 27 mg/dL — AB (ref >39)
LDL CALC: 67 mg/dL (ref 0–99)
TRIGLYCERIDES: 170 mg/dL — AB (ref <150)
Total CHOL/HDL Ratio: 4.7 Ratio
VLDL: 34 mg/dL (ref 0–40)

## 2013-10-08 LAB — HEPATIC FUNCTION PANEL
ALT: 28 U/L (ref 0–53)
AST: 21 U/L (ref 0–37)
Albumin: 4.2 g/dL (ref 3.5–5.2)
Alkaline Phosphatase: 35 U/L — ABNORMAL LOW (ref 39–117)
BILIRUBIN DIRECT: 0.1 mg/dL (ref 0.0–0.3)
Indirect Bilirubin: 0.4 mg/dL (ref 0.2–1.2)
TOTAL PROTEIN: 6.9 g/dL (ref 6.0–8.3)
Total Bilirubin: 0.5 mg/dL (ref 0.2–1.2)

## 2013-10-08 LAB — CBC
HEMATOCRIT: 43.6 % (ref 39.0–52.0)
Hemoglobin: 14.6 g/dL (ref 13.0–17.0)
MCH: 26.2 pg (ref 26.0–34.0)
MCHC: 33.5 g/dL (ref 30.0–36.0)
MCV: 78.1 fL (ref 78.0–100.0)
Platelets: 177 10*3/uL (ref 150–400)
RBC: 5.58 MIL/uL (ref 4.22–5.81)
RDW: 15.1 % (ref 11.5–15.5)
WBC: 4 10*3/uL (ref 4.0–10.5)

## 2013-10-08 LAB — HEMOGLOBIN A1C
HEMOGLOBIN A1C: 6.4 % — AB (ref <5.7)
Mean Plasma Glucose: 137 mg/dL — ABNORMAL HIGH (ref <117)

## 2013-10-08 NOTE — Telephone Encounter (Signed)
Lab order placed.

## 2013-10-09 ENCOUNTER — Encounter: Payer: Self-pay | Admitting: Family Medicine

## 2013-10-09 ENCOUNTER — Ambulatory Visit (INDEPENDENT_AMBULATORY_CARE_PROVIDER_SITE_OTHER): Payer: Medicare Other | Admitting: Family Medicine

## 2013-10-09 VITALS — BP 118/62 | HR 68 | Temp 97.6°F | Ht 71.0 in | Wt 235.0 lb

## 2013-10-09 DIAGNOSIS — R0602 Shortness of breath: Secondary | ICD-10-CM | POA: Diagnosis not present

## 2013-10-09 DIAGNOSIS — F411 Generalized anxiety disorder: Secondary | ICD-10-CM | POA: Diagnosis not present

## 2013-10-09 DIAGNOSIS — M109 Gout, unspecified: Secondary | ICD-10-CM | POA: Diagnosis not present

## 2013-10-09 DIAGNOSIS — E119 Type 2 diabetes mellitus without complications: Secondary | ICD-10-CM

## 2013-10-09 DIAGNOSIS — J449 Chronic obstructive pulmonary disease, unspecified: Secondary | ICD-10-CM

## 2013-10-09 DIAGNOSIS — I10 Essential (primary) hypertension: Secondary | ICD-10-CM

## 2013-10-09 DIAGNOSIS — M25569 Pain in unspecified knee: Secondary | ICD-10-CM

## 2013-10-09 DIAGNOSIS — M25561 Pain in right knee: Secondary | ICD-10-CM

## 2013-10-09 LAB — TSH: TSH: 2.063 u[IU]/mL (ref 0.350–4.500)

## 2013-10-09 MED ORDER — ALLOPURINOL 300 MG PO TABS: 300.0000 mg | ORAL_TABLET | Freq: Every day | ORAL | Status: DC

## 2013-10-09 MED ORDER — LORAZEPAM 1 MG PO TABS: 1.0000 mg | ORAL_TABLET | Freq: Two times a day (BID) | ORAL | Status: DC | PRN

## 2013-10-09 MED ORDER — ALBUTEROL SULFATE HFA 108 (90 BASE) MCG/ACT IN AERS: 2.0000 | INHALATION_SPRAY | Freq: Four times a day (QID) | RESPIRATORY_TRACT | Status: DC | PRN

## 2013-10-09 NOTE — Progress Notes (Signed)
Pre visit review using our clinic review tool, if applicable. No additional management support is needed unless otherwise documented below in the visit note. 

## 2013-10-09 NOTE — Patient Instructions (Signed)

## 2013-10-10 ENCOUNTER — Ambulatory Visit (INDEPENDENT_AMBULATORY_CARE_PROVIDER_SITE_OTHER): Payer: Medicare Other | Admitting: Pulmonary Disease

## 2013-10-10 ENCOUNTER — Encounter: Payer: Self-pay | Admitting: Pulmonary Disease

## 2013-10-10 VITALS — BP 122/80 | HR 68 | Ht 71.0 in | Wt 236.6 lb

## 2013-10-10 DIAGNOSIS — J449 Chronic obstructive pulmonary disease, unspecified: Secondary | ICD-10-CM | POA: Diagnosis not present

## 2013-10-10 DIAGNOSIS — G4730 Sleep apnea, unspecified: Secondary | ICD-10-CM | POA: Diagnosis not present

## 2013-10-10 DIAGNOSIS — R0602 Shortness of breath: Secondary | ICD-10-CM

## 2013-10-10 NOTE — Patient Instructions (Signed)
OK to use albuterol as needed only We will check CPAP report & give you feedback

## 2013-10-10 NOTE — Progress Notes (Signed)
   Subjective:    Patient ID: [name], male    DOB: 03-21-1951, 63 y.o.   MRN: [id number]  HPI  PCP - Randel Pigg  Cards >> Croitoru  EP- Lovena Le   62/M for FU of insomnia & severe obstructive sleep apnea  He has atrial fibrillation & non ischemic cardiomyopathy with EF 40% ( ? Tachycardia induced)  He has longstanding insomnia, witnessed apneas, loud snoring, excessive daytime fatigue, and non-refreshing and restless sleep. PSG - weighed 225 pounds,BMI of 31, neck size of 14 inches He worked 3 rd shift x 6 yrs, last worked 3-10p  PSG dec '11>> Sleep was very fragmented due to frequent arousals and awakenings, especially in the second half of the night with CPAP. He was desensitized with a standard nasal mask. Note that he took 2 tablets of temazepam of 15 mg prior to the study. >>AHI was 42/h with lowest desatn of 86 % >>corrected by CPAP of 8 cm with a nasal mask. Poor sleep efficiency which is consistent with history of insomnia of sleep maintenance. >> started on ambien & CPAP  Download 12/28 -07/02/10 >> good compliance avg 6.5h, AHI 15/h, centrals 9/h, obstructive 4.5/h, avg pr 8 cm  Download on 10 cm 2/28-3/28/12 >> few residual events, some central apneas persist   PFT -> 01/2010 - TLC 77%, DLCO 56%, FVC 3.5L/72%, RAtio 77 ,FEV 1 78%        10/10/2013  Chief Complaint  Patient presents with  . Follow-up    Pt reports he wears CPAP everynight x 5.5 hrs a night. Has no complaints today.    Had severe gout attack - on allopurinol now  Continues to battle insomnia  - now on temazepam & anxiety - on lorazepam bid If he does not sleep well, will have a bad day following Compliant with cpap wears CPAP everynight x 5 hrs a night. Pt states he does not feel rested all the time.  has good and bad days with his breathing.   Review of Systems neg for any significant sore throat, dysphagia, itching, sneezing, nasal congestion or excess/ purulent secretions, fever, chills, sweats,  unintended wt loss, pleuritic or exertional cp, hempoptysis, orthopnea pnd or change in chronic leg swelling. Also denies presyncope, palpitations, heartburn, abdominal pain, nausea, vomiting, diarrhea or change in bowel or urinary habits, dysuria,hematuria, rash, arthralgias, visual complaints, headache, numbness weakness or ataxia.     Objective:   Physical Exam  Gen. Pleasant,  in no distress ENT - no lesions, no post nasal drip Neck: No JVD, no thyromegaly, no carotid bruits Lungs: no use of accessory muscles, no dullness to percussion, decreased without rales or rhonchi  Cardiovascular: Rhythm regular, heart sounds  normal, no murmurs or gallops, no peripheral edema Musculoskeletal: No deformities, no cyanosis or clubbing , no tremors        Assessment & Plan:

## 2013-10-11 ENCOUNTER — Telehealth: Payer: Self-pay | Admitting: Family Medicine

## 2013-10-11 NOTE — Telephone Encounter (Signed)
Received paperwork for Proventil HFA AER, forward to nurse

## 2013-10-11 NOTE — Assessment & Plan Note (Signed)
Ct albuterol prn - discussed risk of tachyrythmia - he is using gingerly

## 2013-10-11 NOTE — Assessment & Plan Note (Signed)
Ct CPAP Weight loss encouraged, compliance with goal of at least 4-6 hrs every night is the expectation. Advised against medications with sedative side effects Cautioned against driving when sleepy - understanding that sleepiness will vary on a day to day basis  

## 2013-10-14 ENCOUNTER — Encounter: Payer: Self-pay | Admitting: Family Medicine

## 2013-10-14 NOTE — Assessment & Plan Note (Signed)
Well controlled, no changes to meds. Encouraged heart healthy diet such as the DASH diet and exercise as tolerated.  °

## 2013-10-14 NOTE — Assessment & Plan Note (Signed)
Follows closely with pulmonology no recent flares

## 2013-10-14 NOTE — Assessment & Plan Note (Signed)
Pain and stiffness really b/l knees, right worst, try topical treatments and gets relief from Naproxen, can use minimally but6 encouraged to try dosing Tylenol bid routinely and see if that helps his symptoms

## 2013-10-14 NOTE — Assessment & Plan Note (Signed)
Well controlled on allopurinol

## 2013-10-14 NOTE — Progress Notes (Signed)
Patient ID: [name], male   DOB: 1951-02-05, 63 y.o.   MRN: [id number] [name] [id number] Jun 28, 1950 10/14/2013      Progress Note-Follow Up  Subjective  Chief Complaint  Chief Complaint  Patient presents with  . Follow-up    2 month    HPI  Patient is a 63 year old male in today for routine medical care. Continues to struggle with knee pain and stiffness but no recent injury, uses Naproxen and activity to manage. No recent illness. Continues to struggle with fatigue and dyspnea but stable and manageable. Denies CP/palp/HA/congestion/fevers/GI or GU c/o. Taking meds as prescribed  Past Medical History  Diagnosis Date  . Hypertension   . Hyperlipidemia   . Atrial fibrillation   . UNSPECIFIED ANEMIA 01/05/2010  . TESTICULAR HYPOFUNCTION 01/05/2010  . PULMONARY FUNCTION TESTS, ABNORMAL 02/02/2010  . PERIPHERAL NEUROPATHY 01/05/2010  . OBESITY NOS 01/25/2007  . NEUROMA 01/05/2010  . INSOMNIA, HX OF 01/25/2007  . HYPERTENSION 01/25/2007  . FATIGUE, CHRONIC 11/11/2009  . ERECTILE DYSFUNCTION 01/25/2007  . DYSPNEA 12/19/2009  . CHF 12/05/2009  . CHEST PAIN-UNSPECIFIED 11/11/2009  . BENIGN PROSTATIC HYPERTROPHY, HX OF 12/05/2009  . Atrial flutter 12/05/2009  . AORTIC STENOSIS, MODERATE 12/05/2009  . Knee pain, right 12/28/2010  . Hypoxia 05/05/2011  . Heart murmur   . SLEEP APNEA 04/06/2010    uses cpap  . DM 12/05/2009  . Superficial thrombophlebitis of left leg 09/07/2011  . Atypical chest pain 11/08/2011  . 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 Hyperkalemia 04/18/2012  . Edema 04/18/2012  . Debility 04/18/2012  . Toe pain, right 07/10/2012  . Gout 07/10/2012    Past Surgical History  Procedure Laterality Date  . Bilat foot surgery removed part of 5th metatarsal to corect curvature of toes    . Cardiac catheterization  10/16/2009    nonischemic cardiomyopathy    Family History  Problem Relation Age of Onset  . Clotting disorder Mother   . Heart disease Mother     s/p MI  . Heart attack  Mother   . Hypertension Mother   . Diabetes Mother   . Hyperlipidemia Mother   . Stroke Mother   . Cancer Father     ? lung  . Lung disease Father     smoker  . Diabetes Sister   . Hypertension Sister     smoker  . Leukemia Maternal Grandmother     ?  [name] Aneurysm Sister     brain  . Other Sister     clipped  . Seizures Sister     d/o w/aneurysm/ smoker    History   Social History  . Marital Status: Married    Spouse Name: N/A    Number of Children: N/A  . Years of Education: N/A   Occupational History  . Not on file.   Social History Main Topics  . Smoking status: Former Smoker -- 1.00 packs/day for 10 years    Quit date: 06/07/1980  . Smokeless tobacco: Never Used  . Alcohol Use: No  . Drug Use: No  . Sexual Activity: Not Currently   Other Topics Concern  . Not on file   Social History Narrative  . No narrative on file    Current Outpatient Prescriptions on File Prior to Visit  Medication Sig Dispense Refill  . aspirin EC 81 MG tablet Take 81 mg by mouth daily.      . benazepril (LOTENSIN) 40 MG tablet Take 40 mg by  mouth daily.      . calcium carbonate (TUMS - DOSED IN MG ELEMENTAL CALCIUM) 500 MG chewable tablet Chew 1 tablet by mouth daily as needed. For heartburn      . colchicine (COLCRYS) 0.6 MG tablet Daily      . COLCRYS 0.6 MG tablet TAKE ONE TABLET BY MOUTH EVERY TWO HOURS FOR FOUR DOSES OR PAIN RELIEF TODAY. THEN TAKE ONE TABLET BY MOUTH ONCE DAILY  35 tablet  0  . fish oil-omega-3 fatty acids 1000 MG capsule Take 1 g by mouth daily.       . furosemide (LASIX) 40 MG tablet Take 1 tablet (40 mg total) by mouth daily.  30 tablet  9  . glyBURIDE (DIABETA) 5 MG tablet TAKE ONE TABLET BY MOUTH ONCE DAILY WITH  BREAKFAST  30 tablet  2  . HYDROcodone-acetaminophen (NORCO) 10-325 MG per tablet Take 1 tablet by mouth every 8 (eight) hours as needed for moderate pain or severe pain.  90 tablet  0  . metFORMIN (GLUCOPHAGE) 1000 MG tablet Take 1 tablet (1,000  mg total) by mouth 2 (two) times daily with a meal.  60 tablet  5  . metoprolol (LOPRESSOR) 100 MG tablet Take 1 tablet (100 mg total) by mouth 2 (two) times daily.  60 tablet  3  . Multiple Vitamins-Minerals (MULTIVITAMIN PO) Take 1 tablet by mouth daily.      . naproxen (NAPROSYN) 375 MG tablet TAKE ONE TABLET BY MOUTH ONCE DAILY AS NEEDED FOR PAIN  30 tablet  0  . potassium chloride SA (KLOR-CON M20) 20 MEQ tablet Take 1 tablet (20 mEq total) by mouth daily.  30 tablet  5  . pravastatin (PRAVACHOL) 40 MG tablet TAKE ONE TABLET BY MOUTH AT BEDTIME  30 tablet  6  . temazepam (RESTORIL) 30 MG capsule TAKE ONE CAPSULE BY MOUTH AT BEDTIME AS NEEDED FOR SLEEP OR ANXIETY  30 capsule  0  . Testosterone (ANDROGEL) 20.25 MG/1.25GM (1.62%) GEL Place 4 Squirts onto the skin daily.       [name] TIKOSYN 500 MCG capsule TAKE 1 CAPSULE BY MOUTH EVERY 12 HOURS  60 capsule  5  . warfarin (COUMADIN) 4 MG tablet TAKE ONE TABLET BY MOUTH ONCE DAILY OR AS DIRECTED  90 tablet  1   No current facility-administered medications on file prior to visit.    Allergies  Allergen Reactions  . Sulfonamide Derivatives Other (See Comments)    unknown reaction  . Zolpidem     Excessive, prolonged sedation    Review of Systems  Review of Systems  Constitutional: Positive for malaise/fatigue. Negative for fever.  HENT: Negative for congestion.   Eyes: Negative for discharge.  Respiratory: Positive for shortness of breath.   Cardiovascular: Negative for chest pain, palpitations and leg swelling.  Gastrointestinal: Negative for nausea, abdominal pain and diarrhea.  Genitourinary: Negative for dysuria.  Musculoskeletal: Positive for joint pain. Negative for falls.  Skin: Negative for rash.  Neurological: Negative for loss of consciousness and headaches.  Endo/Heme/Allergies: Negative for polydipsia.  Psychiatric/Behavioral: Negative for depression and suicidal ideas. The patient is not nervous/anxious and does not have  insomnia.     Objective  BP 118/62  Pulse 68  Temp(Src) 97.6 F (36.4 C) (Oral)  Ht 5\' 11"  (1.803 m)  Wt 235 lb (106.595 kg)  BMI 32.79 kg/m2  SpO2 97%  Physical Exam  Physical Exam  Constitutional: He is oriented to person, place, and time and well-developed, well-nourished, and in  no distress. No distress.  HENT:  Head: Normocephalic and atraumatic.  Eyes: Conjunctivae are normal.  Neck: Neck supple. No thyromegaly present.  Cardiovascular: Normal rate and regular rhythm.   Murmur heard. Pulmonary/Chest: Effort normal and breath sounds normal. No respiratory distress.  Abdominal: He exhibits no distension and no mass. There is no tenderness.  Musculoskeletal: He exhibits no edema.  Neurological: He is alert and oriented to person, place, and time.  Skin: Skin is warm.  Psychiatric: Memory, affect and judgment normal.    Lab Results  Component Value Date   TSH 2.063 10/08/2013   Lab Results  Component Value Date   WBC 4.0 10/08/2013   HGB 14.6 10/08/2013   HCT 43.6 10/08/2013   MCV 78.1 10/08/2013   PLT 177 10/08/2013   Lab Results  Component Value Date   CREATININE 1.20 10/08/2013   BUN 19 10/08/2013   NA 142 10/08/2013   K 4.9 10/08/2013   CL 106 10/08/2013   CO2 27 10/08/2013   Lab Results  Component Value Date   ALT 28 10/08/2013   AST 21 10/08/2013   ALKPHOS 35* 10/08/2013   BILITOT 0.5 10/08/2013   Lab Results  Component Value Date   CHOL 128 10/08/2013   Lab Results  Component Value Date   HDL 27* 10/08/2013   Lab Results  Component Value Date   LDLCALC 67 10/08/2013   Lab Results  Component Value Date   TRIG 170* 10/08/2013   Lab Results  Component Value Date   CHOLHDL 4.7 10/08/2013     Assessment & Plan  DM hgba1c acceptable, minimize simple carbs. Increase exercise as tolerated. Continue current meds  HYPERTENSION Well controlled, no changes to meds. Encouraged heart healthy diet such as the DASH diet and exercise as tolerated.   Knee pain, right Pain and  stiffness really b/l knees, right worst, try topical treatments and gets relief from Naproxen, can use minimally but6 encouraged to try dosing Tylenol bid routinely and see if that helps his symptoms  COPD (chronic obstructive pulmonary disease) Follows closely with pulmonology no recent flares  Gout Well controlled on allopurinol

## 2013-10-14 NOTE — Assessment & Plan Note (Signed)
hgba1c acceptable, minimize simple carbs. Increase exercise as tolerated. Continue current meds 

## 2013-10-15 NOTE — Telephone Encounter (Signed)
Pt informed that we are waiting on the pa to come back

## 2013-10-17 DIAGNOSIS — R972 Elevated prostate specific antigen [PSA]: Secondary | ICD-10-CM | POA: Diagnosis not present

## 2013-10-17 DIAGNOSIS — E291 Testicular hypofunction: Secondary | ICD-10-CM | POA: Diagnosis not present

## 2013-10-23 ENCOUNTER — Telehealth: Payer: Self-pay | Admitting: Family Medicine

## 2013-10-23 NOTE — Telephone Encounter (Signed)
Received PA for Proventil HFA, forward to nurse

## 2013-10-24 ENCOUNTER — Other Ambulatory Visit: Payer: Self-pay | Admitting: Family Medicine

## 2013-10-24 DIAGNOSIS — N401 Enlarged prostate with lower urinary tract symptoms: Secondary | ICD-10-CM | POA: Diagnosis not present

## 2013-10-24 DIAGNOSIS — N138 Other obstructive and reflux uropathy: Secondary | ICD-10-CM | POA: Diagnosis not present

## 2013-10-24 DIAGNOSIS — R972 Elevated prostate specific antigen [PSA]: Secondary | ICD-10-CM | POA: Diagnosis not present

## 2013-10-24 DIAGNOSIS — E291 Testicular hypofunction: Secondary | ICD-10-CM | POA: Diagnosis not present

## 2013-10-24 DIAGNOSIS — N529 Male erectile dysfunction, unspecified: Secondary | ICD-10-CM | POA: Diagnosis not present

## 2013-10-25 ENCOUNTER — Other Ambulatory Visit: Payer: Self-pay

## 2013-10-25 NOTE — Telephone Encounter (Signed)
Rx faxed

## 2013-10-25 NOTE — Telephone Encounter (Signed)
Last Ov 10/09/2013. Last refill 09/17/2013

## 2013-10-26 NOTE — Telephone Encounter (Signed)
[employer] called and left message regarding pt's Proventil. They would like to verify if pt has Dx of asthma or COPD. Also if pt has ever been treated with ProAir or Ventolin? Please call [phone]. ID #[id number]

## 2013-10-30 NOTE — Telephone Encounter (Signed)
Will need to ask patient if he has taken either of the others and then ask insurance if they are going to make [place] switch to one of the other options.

## 2013-10-31 ENCOUNTER — Ambulatory Visit (INDEPENDENT_AMBULATORY_CARE_PROVIDER_SITE_OTHER): Payer: Medicare Other | Admitting: Pharmacist Clinician (PhC)/ Clinical Pharmacy Specialist

## 2013-10-31 DIAGNOSIS — I4891 Unspecified atrial fibrillation: Secondary | ICD-10-CM

## 2013-10-31 DIAGNOSIS — Z7901 Long term (current) use of anticoagulants: Secondary | ICD-10-CM | POA: Diagnosis not present

## 2013-10-31 LAB — POCT INR: INR: 2.4

## 2013-11-01 NOTE — Telephone Encounter (Signed)
RX denied.

## 2013-11-01 NOTE — Telephone Encounter (Signed)
So I sent the paperwork back insurance is asking [place] if he has used alternatives, Ventolin or ProAir. If so did they not work or did he have trouble? If he did not have trouble they may make [place] switch

## 2013-11-02 NOTE — Telephone Encounter (Signed)
Pt states the only one he has used proventil  Please advise?

## 2013-11-02 NOTE — Telephone Encounter (Signed)
Patient left a vm stating that he has taken Proair. He stated in message that he went back through some paperwork and he was on it for awhile  refaxed pa

## 2013-11-02 NOTE — Telephone Encounter (Signed)
So now we need to check with insurance and find out which one they will pay for and switch it

## 2013-11-02 NOTE — Telephone Encounter (Signed)
Left a detailed message on answering machine for patient to return my call

## 2013-11-05 NOTE — Telephone Encounter (Signed)
New prior auth form received , forward to nurse

## 2013-11-08 NOTE — Telephone Encounter (Signed)
copd

## 2013-11-08 NOTE — Telephone Encounter (Signed)
John w/[employer] called stating that pt diagnosis for proventil SOB isn't medically acceptable? Is there another diagnosis?  Call Mliss Sax at [phone]  Please advise?

## 2013-11-09 NOTE — Telephone Encounter (Signed)
cystal informed and states she will put the approval in and it will take 35-45 min.

## 2013-11-13 NOTE — Telephone Encounter (Signed)
Pa approved for proventil hfa from 10-09-13 to 06-06-14

## 2013-11-14 ENCOUNTER — Other Ambulatory Visit: Payer: Self-pay | Admitting: Cardiovascular Disease

## 2013-11-15 ENCOUNTER — Telehealth: Payer: Self-pay | Admitting: Pharmacist Clinician (PhC)/ Clinical Pharmacy Specialist

## 2013-11-16 NOTE — Telephone Encounter (Signed)
Closed encounter °

## 2013-11-26 ENCOUNTER — Other Ambulatory Visit: Payer: Self-pay | Admitting: Family Medicine

## 2013-11-26 NOTE — Telephone Encounter (Signed)
Refill sent per [facility] refill protocol/SLS  

## 2013-11-29 ENCOUNTER — Other Ambulatory Visit: Payer: Self-pay | Admitting: Family Medicine

## 2013-11-29 NOTE — Telephone Encounter (Signed)
Rx request faxed to pharmacy/SLS  

## 2013-11-29 NOTE — Telephone Encounter (Signed)
Refill request for temazepam Last filled by MD on - 10/25/2013 #30 x0 Last Appt:10/09/13 Next Appt:01/10/14 Please advise refill?

## 2013-12-10 ENCOUNTER — Ambulatory Visit (INDEPENDENT_AMBULATORY_CARE_PROVIDER_SITE_OTHER): Payer: Medicare Other | Admitting: Pharmacist

## 2013-12-10 ENCOUNTER — Other Ambulatory Visit: Payer: Self-pay | Admitting: Cardiovascular Disease

## 2013-12-10 DIAGNOSIS — Z7901 Long term (current) use of anticoagulants: Secondary | ICD-10-CM

## 2013-12-10 DIAGNOSIS — I4891 Unspecified atrial fibrillation: Secondary | ICD-10-CM

## 2013-12-10 LAB — POCT INR: INR: 2.4

## 2013-12-11 NOTE — Telephone Encounter (Signed)
Rx refill sent to patient pharmacy   

## 2014-01-04 ENCOUNTER — Other Ambulatory Visit: Payer: Self-pay | Admitting: Family Medicine

## 2014-01-09 ENCOUNTER — Other Ambulatory Visit (INDEPENDENT_AMBULATORY_CARE_PROVIDER_SITE_OTHER): Payer: Medicare Other

## 2014-01-09 ENCOUNTER — Telehealth: Payer: Self-pay | Admitting: Family Medicine

## 2014-01-09 DIAGNOSIS — E785 Hyperlipidemia, unspecified: Secondary | ICD-10-CM

## 2014-01-09 DIAGNOSIS — I10 Essential (primary) hypertension: Secondary | ICD-10-CM

## 2014-01-09 DIAGNOSIS — E119 Type 2 diabetes mellitus without complications: Secondary | ICD-10-CM

## 2014-01-09 LAB — HEPATIC FUNCTION PANEL
ALBUMIN: 4 g/dL (ref 3.5–5.2)
ALK PHOS: 37 U/L — AB (ref 39–117)
ALT: 19 U/L (ref 0–53)
AST: 19 U/L (ref 0–37)
Bilirubin, Direct: 0.1 mg/dL (ref 0.0–0.3)
TOTAL PROTEIN: 7.2 g/dL (ref 6.0–8.3)
Total Bilirubin: 0.9 mg/dL (ref 0.2–1.2)

## 2014-01-09 LAB — RENAL FUNCTION PANEL
Albumin: 4 g/dL (ref 3.5–5.2)
BUN: 20 mg/dL (ref 6–23)
CALCIUM: 9.4 mg/dL (ref 8.4–10.5)
CO2: 28 meq/L (ref 19–32)
CREATININE: 1.3 mg/dL (ref 0.4–1.5)
Chloride: 105 mEq/L (ref 96–112)
GFR: 74.82 mL/min (ref >60.00)
Glucose, Bld: 116 mg/dL — ABNORMAL HIGH (ref 70–99)
Phosphorus: 3.1 mg/dL (ref 2.3–4.6)
Potassium: 4.4 mEq/L (ref 3.5–5.1)
Sodium: 139 mEq/L (ref 135–145)

## 2014-01-09 LAB — LIPID PANEL
CHOLESTEROL: 128 mg/dL (ref 0–200)
HDL: 27.6 mg/dL — ABNORMAL LOW (ref >39.00)
LDL Cholesterol: 83 mg/dL (ref 0–99)
NonHDL: 100.4
Total CHOL/HDL Ratio: 5
Triglycerides: 86 mg/dL (ref 0.0–149.0)
VLDL: 17.2 mg/dL (ref 0.0–40.0)

## 2014-01-09 LAB — HEMOGLOBIN A1C: Hgb A1c MFr Bld: 6.6 % — ABNORMAL HIGH (ref 4.6–6.5)

## 2014-01-09 LAB — CBC
HEMATOCRIT: 44.1 % (ref 39.0–52.0)
HEMOGLOBIN: 14.5 g/dL (ref 13.0–17.0)
MCHC: 32.9 g/dL (ref 30.0–36.0)
MCV: 79 fl (ref 78.0–100.0)
PLATELETS: 176 10*3/uL (ref 150.0–400.0)
RBC: 5.59 Mil/uL (ref 4.22–5.81)
RDW: 16.6 % — AB (ref 11.5–15.5)
WBC: 4.1 10*3/uL (ref 4.0–10.5)

## 2014-01-09 LAB — TSH: TSH: 0.94 u[IU]/mL (ref 0.35–4.50)

## 2014-01-09 NOTE — Telephone Encounter (Signed)
Lab order for today for elam Return in about 3 months (around 01/09/2014) for lipid, renal, cbc, tsh, hepatic, hgba1c prior. Diagnostic follow-up: Instructions: Check out comments: Uric acid with next blood draw

## 2014-01-09 NOTE — Telephone Encounter (Signed)
Lab order entered.

## 2014-01-10 ENCOUNTER — Encounter: Payer: Self-pay | Admitting: Family Medicine

## 2014-01-10 ENCOUNTER — Ambulatory Visit (INDEPENDENT_AMBULATORY_CARE_PROVIDER_SITE_OTHER): Payer: Medicare Other | Admitting: Family Medicine

## 2014-01-10 VITALS — BP 122/70 | HR 69 | Temp 97.8°F | Ht 71.0 in | Wt 230.1 lb

## 2014-01-10 DIAGNOSIS — F411 Generalized anxiety disorder: Secondary | ICD-10-CM | POA: Diagnosis not present

## 2014-01-10 DIAGNOSIS — E119 Type 2 diabetes mellitus without complications: Secondary | ICD-10-CM | POA: Diagnosis not present

## 2014-01-10 DIAGNOSIS — I509 Heart failure, unspecified: Secondary | ICD-10-CM

## 2014-01-10 DIAGNOSIS — I4891 Unspecified atrial fibrillation: Secondary | ICD-10-CM

## 2014-01-10 DIAGNOSIS — M1030 Gout due to renal impairment, unspecified site: Secondary | ICD-10-CM

## 2014-01-10 DIAGNOSIS — I10 Essential (primary) hypertension: Secondary | ICD-10-CM

## 2014-01-10 DIAGNOSIS — N289 Disorder of kidney and ureter, unspecified: Secondary | ICD-10-CM

## 2014-01-10 DIAGNOSIS — I5032 Chronic diastolic (congestive) heart failure: Secondary | ICD-10-CM

## 2014-01-10 DIAGNOSIS — Z9189 Other specified personal risk factors, not elsewhere classified: Secondary | ICD-10-CM

## 2014-01-10 MED ORDER — GLYBURIDE 5 MG PO TABS: 5.0000 mg | ORAL_TABLET | Freq: Every day | ORAL | Status: DC

## 2014-01-10 MED ORDER — LORAZEPAM 1 MG PO TABS: 1.0000 mg | ORAL_TABLET | Freq: Two times a day (BID) | ORAL | Status: DC | PRN

## 2014-01-10 NOTE — Patient Instructions (Signed)

## 2014-01-10 NOTE — Assessment & Plan Note (Signed)
Doing well on current meds no changes 

## 2014-01-10 NOTE — Assessment & Plan Note (Signed)
RRR today 

## 2014-01-10 NOTE — Assessment & Plan Note (Signed)
hgba1c acceptable, minimize simple carbs. Increase exercise as tolerated. Continue current meds 

## 2014-01-10 NOTE — Assessment & Plan Note (Signed)
Well controlled, no changes to meds. Encouraged heart healthy diet such as the DASH diet and exercise as tolerated.  °

## 2014-01-10 NOTE — Assessment & Plan Note (Signed)
Doing well. No recent exacerbations, is well controlled.

## 2014-01-10 NOTE — Progress Notes (Signed)
Patient ID: [name], male   DOB: 26-Sep-1950, 63 y.o.   MRN: [id number] [name] [id number] 1951/02/01 01/10/2014      Progress Note-Follow Up  Subjective  Chief Complaint  Chief Complaint  Patient presents with  . Follow-up    3 month    HPI  Patient is a 63 year old male in today for routine medical care. Patient doing well. No recent trips to the emergency room, illness or hospitalizations. He occasionally struggles with palpitations and irregular rhythm split they resolve spontaneously. He has no associated chest pain or syncope. He reports his blood sugars have been well-controlled. Denies polyuria or polydipsia. Continues to struggle with insomnia but does get adequate sleep with medications as prescribed. Well controlled, no changes to meds. Encouraged heart healthy diet such as the DASH diet and exercise as tolerated.    Past Medical History  Diagnosis Date  . Hypertension   . Hyperlipidemia   . Atrial fibrillation   . UNSPECIFIED ANEMIA 01/05/2010  . TESTICULAR HYPOFUNCTION 01/05/2010  . PULMONARY FUNCTION TESTS, ABNORMAL 02/02/2010  . PERIPHERAL NEUROPATHY 01/05/2010  . OBESITY NOS 01/25/2007  . NEUROMA 01/05/2010  . INSOMNIA, HX OF 01/25/2007  . HYPERTENSION 01/25/2007  . FATIGUE, CHRONIC 11/11/2009  . ERECTILE DYSFUNCTION 01/25/2007  . DYSPNEA 12/19/2009  . CHF 12/05/2009  . CHEST PAIN-UNSPECIFIED 11/11/2009  . BENIGN PROSTATIC HYPERTROPHY, HX OF 12/05/2009  . Atrial flutter 12/05/2009  . AORTIC STENOSIS, MODERATE 12/05/2009  . Knee pain, right 12/28/2010  . Hypoxia 05/05/2011  . Heart murmur   . SLEEP APNEA 04/06/2010    uses cpap  . DM 12/05/2009  . Superficial thrombophlebitis of left leg 09/07/2011  . Atypical chest pain 11/08/2011  . 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 Hyperkalemia 04/18/2012  . Edema 04/18/2012  . Debility 04/18/2012  . Toe pain, right 07/10/2012  . Gout 07/10/2012    Past Surgical History  Procedure Laterality Date  . Bilat foot surgery removed part of 5th  metatarsal to corect curvature of toes    . Cardiac catheterization  10/16/2009    nonischemic cardiomyopathy    Family History  Problem Relation Age of Onset  . Clotting disorder Mother   . Heart disease Mother     s/p MI  . Heart attack Mother   . Hypertension Mother   . Diabetes Mother   . Hyperlipidemia Mother   . Stroke Mother   . Cancer Father     ? lung  . Lung disease Father     smoker  . Diabetes Sister   . Hypertension Sister     smoker  . Leukemia Maternal Grandmother     ?  [name] Aneurysm Sister     brain  . Other Sister     clipped  . Seizures Sister     d/o w/aneurysm/ smoker    History   Social History  . Marital Status: Married    Spouse Name: N/A    Number of Children: N/A  . Years of Education: N/A   Occupational History  . Not on file.   Social History Main Topics  . Smoking status: Former Smoker -- 1.00 packs/day for 10 years    Quit date: 06/07/1980  . Smokeless tobacco: Never Used  . Alcohol Use: No  . Drug Use: No  . Sexual Activity: Not Currently   Other Topics Concern  . Not on file   Social History Narrative  . No narrative on file    Current Outpatient  Prescriptions on File Prior to Visit  Medication Sig Dispense Refill  . albuterol (PROVENTIL HFA;VENTOLIN HFA) 108 (90 BASE) MCG/ACT inhaler Inhale 2 puffs into the lungs every 6 (six) hours as needed for wheezing.  3 Inhaler  3  . allopurinol (ZYLOPRIM) 300 MG tablet Take 1 tablet (300 mg total) by mouth daily.  90 tablet  1  . aspirin EC 81 MG tablet Take 81 mg by mouth daily.      . benazepril (LOTENSIN) 40 MG tablet TAKE ONE TABLET BY MOUTH ONCE DAILY  90 tablet  0  . calcium carbonate (TUMS - DOSED IN MG ELEMENTAL CALCIUM) 500 MG chewable tablet Chew 1 tablet by mouth daily as needed. For heartburn      . COLCRYS 0.6 MG tablet TAKE ONE TABLET BY MOUTH EVERY TWO HOURS FOR FOUR DOSES OR PAIN RELIEF TODAY. THEN TAKE ONE TABLET BY MOUTH ONCE DAILY  35 tablet  0  . fish oil-omega-3  fatty acids 1000 MG capsule Take 1 g by mouth daily.       . furosemide (LASIX) 40 MG tablet Take 1 tablet (40 mg total) by mouth daily.  30 tablet  9  . glyBURIDE (DIABETA) 5 MG tablet TAKE ONE TABLET BY MOUTH ONCE DAILY WITH BREAKFAST  30 tablet  1  . HYDROcodone-acetaminophen (NORCO) 10-325 MG per tablet Take 1 tablet by mouth every 8 (eight) hours as needed for moderate pain or severe pain.  90 tablet  0  . KLOR-CON M20 20 MEQ tablet TAKE ONE-HALF TO ONE TABLET BY MOUTH ONCE DAILY AS DIRECTED  30 tablet  0  . LORazepam (ATIVAN) 1 MG tablet Take 1 tablet (1 mg total) by mouth 2 (two) times daily as needed for anxiety.  60 tablet  1  . metFORMIN (GLUCOPHAGE) 1000 MG tablet Take 1 tablet (1,000 mg total) by mouth 2 (two) times daily with a meal.  60 tablet  5  . metoprolol (LOPRESSOR) 100 MG tablet TAKE ONE TABLET BY MOUTH TWICE DAILY  180 tablet  0  . Multiple Vitamins-Minerals (MULTIVITAMIN PO) Take 1 tablet by mouth daily.      . naproxen (NAPROSYN) 375 MG tablet TAKE ONE TABLET BY MOUTH ONCE DAILY AS NEEDED FOR PAIN  30 tablet  0  . potassium chloride SA (KLOR-CON M20) 20 MEQ tablet Take 1 tablet (20 mEq total) by mouth daily.  30 tablet  5  . pravastatin (PRAVACHOL) 40 MG tablet Take 1 tablet (40 mg total) by mouth daily.  30 tablet  6  . temazepam (RESTORIL) 30 MG capsule TAKE ONE CAPSULE BY MOUTH AT BEDTIME AS NEEDED FOR SLEEP OR ANXIETY  30 capsule  0  . Testosterone (ANDROGEL) 20.25 MG/1.25GM (1.62%) GEL Place 4 Squirts onto the skin daily.       [name] TIKOSYN 500 MCG capsule TAKE 1 CAPSULE BY MOUTH EVERY 12 HOURS  60 capsule  5  . warfarin (COUMADIN) 4 MG tablet TAKE ONE TABLET BY MOUTH ONCE DAILY OR AS DIRECTED  90 tablet  1   No current facility-administered medications on file prior to visit.    Allergies  Allergen Reactions  . Sulfonamide Derivatives Other (See Comments)    unknown reaction  . Zolpidem     Excessive, prolonged sedation    Review of Systems  Review of Systems   Constitutional: Negative for fever and malaise/fatigue.  HENT: Negative for congestion.   Eyes: Negative for discharge.  Respiratory: Negative for shortness of breath.   Cardiovascular:  Negative for chest pain, palpitations and leg swelling.  Gastrointestinal: Negative for nausea, abdominal pain and diarrhea.  Genitourinary: Negative for dysuria.  Musculoskeletal: Negative for falls.  Skin: Negative for rash.  Neurological: Negative for loss of consciousness and headaches.  Endo/Heme/Allergies: Negative for polydipsia.  Psychiatric/Behavioral: Negative for depression and suicidal ideas. The patient is not nervous/anxious and does not have insomnia.     Objective  BP 122/70  Pulse 69  Temp(Src) 97.8 F (36.6 C) (Oral)  Ht 5\' 11"  (1.803 m)  Wt 230 lb 1.4 oz (104.368 kg)  BMI 32.11 kg/m2  SpO2 96%  Physical Exam  Physical Exam  Constitutional: He is oriented to person, place, and time and well-developed, well-nourished, and in no distress. No distress.  HENT:  Head: Normocephalic and atraumatic.  Eyes: Conjunctivae are normal.  Neck: Neck supple. No thyromegaly present.  Cardiovascular: Normal rate, regular rhythm and normal heart sounds.   No murmur heard. Pulmonary/Chest: Effort normal and breath sounds normal. No respiratory distress.  Abdominal: He exhibits no distension and no mass. There is no tenderness.  Musculoskeletal: He exhibits no edema.  Neurological: He is alert and oriented to person, place, and time.  Skin: Skin is warm.  Psychiatric: Memory, affect and judgment normal.    Lab Results  Component Value Date   TSH 0.94 01/09/2014   Lab Results  Component Value Date   WBC 4.1 01/09/2014   HGB 14.5 01/09/2014   HCT 44.1 01/09/2014   MCV 79.0 01/09/2014   PLT 176.0 01/09/2014   Lab Results  Component Value Date   CREATININE 1.3 01/09/2014   BUN 20 01/09/2014   NA 139 01/09/2014   K 4.4 01/09/2014   CL 105 01/09/2014   CO2 28 01/09/2014   Lab Results  Component  Value Date   ALT 19 01/09/2014   AST 19 01/09/2014   ALKPHOS 37* 01/09/2014   BILITOT 0.9 01/09/2014   Lab Results  Component Value Date   CHOL 128 01/09/2014   Lab Results  Component Value Date   HDL 27.60* 01/09/2014   Lab Results  Component Value Date   LDLCALC 83 01/09/2014   Lab Results  Component Value Date   TRIG 86.0 01/09/2014   Lab Results  Component Value Date   CHOLHDL 5 01/09/2014     Assessment & Plan  DM hgba1c acceptable, minimize simple carbs. Increase exercise as tolerated. Continue current meds  HYPERTENSION Well controlled, no changes to meds. Encouraged heart healthy diet such as the DASH diet and exercise as tolerated.   Chronic diastolic congestive heart failure Doing well. No recent exacerbations, is well controlled.  INSOMNIA, HX OF Doing well on current meds no changes.  Atrial fibrillation, converted to SR after 2nd dose of Tikosyn. RRR today

## 2014-01-10 NOTE — Progress Notes (Signed)
Pre visit review using our clinic review tool, if applicable. No additional management support is needed unless otherwise documented below in the visit note. 

## 2014-01-11 ENCOUNTER — Telehealth: Payer: Self-pay | Admitting: Family Medicine

## 2014-01-11 NOTE — Telephone Encounter (Signed)
Relevant patient education mailed to patient.  

## 2014-01-21 ENCOUNTER — Ambulatory Visit (INDEPENDENT_AMBULATORY_CARE_PROVIDER_SITE_OTHER): Payer: Medicare Other | Admitting: Cardiovascular Disease

## 2014-01-21 ENCOUNTER — Ambulatory Visit (INDEPENDENT_AMBULATORY_CARE_PROVIDER_SITE_OTHER): Payer: Medicare Other | Admitting: Pharmacist Clinician (PhC)/ Clinical Pharmacy Specialist

## 2014-01-21 ENCOUNTER — Encounter: Payer: Self-pay | Admitting: Cardiovascular Disease

## 2014-01-21 VITALS — BP 126/78 | HR 65 | Resp 16 | Ht 71.0 in | Wt 229.0 lb

## 2014-01-21 DIAGNOSIS — I4891 Unspecified atrial fibrillation: Secondary | ICD-10-CM | POA: Diagnosis not present

## 2014-01-21 DIAGNOSIS — G4730 Sleep apnea, unspecified: Secondary | ICD-10-CM | POA: Diagnosis not present

## 2014-01-21 DIAGNOSIS — I509 Heart failure, unspecified: Secondary | ICD-10-CM

## 2014-01-21 DIAGNOSIS — I351 Nonrheumatic aortic (valve) insufficiency: Secondary | ICD-10-CM

## 2014-01-21 DIAGNOSIS — I5032 Chronic diastolic (congestive) heart failure: Secondary | ICD-10-CM | POA: Diagnosis not present

## 2014-01-21 DIAGNOSIS — I359 Nonrheumatic aortic valve disorder, unspecified: Secondary | ICD-10-CM

## 2014-01-21 DIAGNOSIS — Z7901 Long term (current) use of anticoagulants: Secondary | ICD-10-CM | POA: Diagnosis not present

## 2014-01-21 LAB — POCT INR: INR: 2.1

## 2014-01-21 NOTE — Progress Notes (Signed)
Patient ID: [name], male   DOB: 1950-09-08, 63 y.o.   MRN: [id number]     Reason for office visit Congestive heart failure, atrial fibrillation, hypertension, obstructive sleep apnea, aortic insufficiency   [name] is doing well. He continues to have his usual functional class II exertional dyspnea, but has not had any recent episodes of heart failure exacerbation. He denies edema or any dyspnea at rest. Since there have been no changes in his diuretic regimen recently, he has been spared previous problems with gout attacks.  His echo showed that left ventricular systolic function is slightly depressed with an EF of 45-50%. He has moderate aortic insufficiency which appears to be secondary to mild dilatation of the aortic root. He has a long-standing history of severe hypertension which has not always been well treated. He also has long-standing history of obstructive sleep apnea which was also not treated until recently. Coronary angiography 2011 did not show evidence of significant stenoses. He has recurrent paroxysmal atrial fibrillation with a good response to treatment with dofetilide. He has occasional palpitations that are nonsustained additional problems include obesity, type 2 diabetes mellitus and androgen deficiency.  Allergies  Allergen Reactions  . Sulfonamide Derivatives Other (See Comments)    unknown reaction  . Zolpidem     Excessive, prolonged sedation    Current Outpatient Prescriptions  Medication Sig Dispense Refill  . albuterol (PROVENTIL HFA;VENTOLIN HFA) 108 (90 BASE) MCG/ACT inhaler Inhale 2 puffs into the lungs every 6 (six) hours as needed for wheezing.  3 Inhaler  3  . allopurinol (ZYLOPRIM) 300 MG tablet Take 1 tablet (300 mg total) by mouth daily.  90 tablet  1  . aspirin EC 81 MG tablet Take 81 mg by mouth daily.      . benazepril (LOTENSIN) 40 MG tablet TAKE ONE TABLET BY MOUTH ONCE DAILY  90 tablet  0  . calcium carbonate (TUMS - DOSED IN MG  ELEMENTAL CALCIUM) 500 MG chewable tablet Chew 1 tablet by mouth daily as needed. For heartburn      . COLCRYS 0.6 MG tablet TAKE ONE TABLET BY MOUTH EVERY TWO HOURS FOR FOUR DOSES OR PAIN RELIEF TODAY. THEN TAKE ONE TABLET BY MOUTH ONCE DAILY  35 tablet  0  . fish oil-omega-3 fatty acids 1000 MG capsule Take 1 g by mouth daily.       . furosemide (LASIX) 40 MG tablet Take 1 tablet (40 mg total) by mouth daily.  30 tablet  9  . glyBURIDE (DIABETA) 5 MG tablet Take 1 tablet (5 mg total) by mouth daily with breakfast.  90 tablet  1  . HYDROcodone-acetaminophen (NORCO) 10-325 MG per tablet Take 1 tablet by mouth every 8 (eight) hours as needed for moderate pain or severe pain.  90 tablet  0  . KLOR-CON M20 20 MEQ tablet TAKE ONE-HALF TO ONE TABLET BY MOUTH ONCE DAILY AS DIRECTED  30 tablet  0  . LORazepam (ATIVAN) 1 MG tablet Take 1 tablet (1 mg total) by mouth 2 (two) times daily as needed for anxiety.  60 tablet  2  . metFORMIN (GLUCOPHAGE) 1000 MG tablet Take 1 tablet (1,000 mg total) by mouth 2 (two) times daily with a meal.  60 tablet  5  . metoprolol (LOPRESSOR) 100 MG tablet TAKE ONE TABLET BY MOUTH TWICE DAILY  180 tablet  0  . Multiple Vitamins-Minerals (MULTIVITAMIN PO) Take 1 tablet by mouth daily.      . naproxen (NAPROSYN) 375  MG tablet TAKE ONE TABLET BY MOUTH ONCE DAILY AS NEEDED FOR PAIN  30 tablet  0  . potassium chloride SA (KLOR-CON M20) 20 MEQ tablet Take 1 tablet (20 mEq total) by mouth daily.  30 tablet  5  . pravastatin (PRAVACHOL) 40 MG tablet Take 1 tablet (40 mg total) by mouth daily.  30 tablet  6  . temazepam (RESTORIL) 30 MG capsule TAKE ONE CAPSULE BY MOUTH AT BEDTIME AS NEEDED FOR SLEEP OR ANXIETY  30 capsule  0  . Testosterone (ANDROGEL) 20.25 MG/1.25GM (1.62%) GEL Place 4 Squirts onto the skin daily.       [name] TIKOSYN 500 MCG capsule TAKE 1 CAPSULE BY MOUTH EVERY 12 HOURS  60 capsule  5  . warfarin (COUMADIN) 4 MG tablet TAKE ONE TABLET BY MOUTH ONCE DAILY OR AS DIRECTED   90 tablet  1   No current facility-administered medications for this visit.    Past Medical History  Diagnosis Date  . Hypertension   . Hyperlipidemia   . Atrial fibrillation   . UNSPECIFIED ANEMIA 01/05/2010  . TESTICULAR HYPOFUNCTION 01/05/2010  . PULMONARY FUNCTION TESTS, ABNORMAL 02/02/2010  . PERIPHERAL NEUROPATHY 01/05/2010  . OBESITY NOS 01/25/2007  . NEUROMA 01/05/2010  . INSOMNIA, HX OF 01/25/2007  . HYPERTENSION 01/25/2007  . FATIGUE, CHRONIC 11/11/2009  . ERECTILE DYSFUNCTION 01/25/2007  . DYSPNEA 12/19/2009  . CHF 12/05/2009  . CHEST PAIN-UNSPECIFIED 11/11/2009  . BENIGN PROSTATIC HYPERTROPHY, HX OF 12/05/2009  . Atrial flutter 12/05/2009  . AORTIC STENOSIS, MODERATE 12/05/2009  . Knee pain, right 12/28/2010  . Hypoxia 05/05/2011  . Heart murmur   . SLEEP APNEA 04/06/2010    uses cpap  . DM 12/05/2009  . Superficial thrombophlebitis of left leg 09/07/2011  . Atypical chest pain 11/08/2011  . 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 Hyperkalemia 04/18/2012  . Edema 04/18/2012  . Debility 04/18/2012  . Toe pain, right 07/10/2012  . Gout 07/10/2012    Past Surgical History  Procedure Laterality Date  . Bilat foot surgery removed part of 5th metatarsal to corect curvature of toes    . Cardiac catheterization  10/16/2009    nonischemic cardiomyopathy    Family History  Problem Relation Age of Onset  . Clotting disorder Mother   . Heart disease Mother     s/p MI  . Heart attack Mother   . Hypertension Mother   . Diabetes Mother   . Hyperlipidemia Mother   . Stroke Mother   . Cancer Father     ? lung  . Lung disease Father     smoker  . Diabetes Sister   . Hypertension Sister     smoker  . Leukemia Maternal Grandmother     ?  [name] Aneurysm Sister     brain  . Other Sister     clipped  . Seizures Sister     d/o w/aneurysm/ smoker    History   Social History  . Marital Status: Married    Spouse Name: N/A    Number of Children: N/A  . Years of Education: N/A   Occupational History  .  Not on file.   Social History Main Topics  . Smoking status: Former Smoker -- 1.00 packs/day for 10 years    Quit date: 06/07/1980  . Smokeless tobacco: Never Used  . Alcohol Use: No  . Drug Use: No  . Sexual Activity: Not Currently   Other Topics Concern  . Not on file   Social  History Narrative  . No narrative on file    Review of systems: The patient specifically denies any chest pain at rest or with exertion, dyspnea at rest, orthopnea, paroxysmal nocturnal dyspnea, syncope,focal neurological deficits, intermittent claudication, lower extremity edema, cough, hemoptysis or wheezing.  The patient also denies abdominal pain, nausea, vomiting, dysphagia, diarrhea, constipation, polyuria, polydipsia, dysuria, hematuria, frequency, urgency, abnormal bleeding or bruising, fever, chills, unexpected weight changes, mood swings, change in skin or hair texture, change in voice quality, auditory or visual problems, allergic reactions or rashes, new musculoskeletal complaints other than usual "aches and pains"   PHYSICAL EXAM BP 126/78  Pulse 65  Resp 16  Ht 5\' 11"  (1.803 m)  Wt 229 lb (103.874 kg)  BMI 31.95 kg/m2 General: Alert, oriented x3, no distress  Head: no evidence of trauma, PERRL, EOMI, no exophtalmos or lid lag, no myxedema, no xanthelasma; normal ears, nose and crowded oropharynx  Neck: normal jugular venous pulsations and no hepatojugular reflux; brisk carotid pulses without delay and no carotid bruits  Chest: clear to auscultation, no signs of consolidation by percussion or palpation, normal fremitus, symmetrical and full respiratory excursions  Cardiovascular: normal position and quality of the apical impulse, regular rhythm, normal first and second heart sounds, loud fourth heart sound, grade 1-2/6 decrescendo diastolic murmur at the left lower sternal border  Abdomen: no tenderness or distention, no masses by palpation, no abnormal pulsatility or arterial bruits, normal  bowel sounds, no hepatosplenomegaly  Extremities: no clubbing, cyanosis or edema; 2+ radial, ulnar and brachial pulses bilaterally; 2+ right femoral, posterior tibial and dorsalis pedis pulses; 2+ left femoral, posterior tibial and dorsalis pedis pulses; no subclavian or femoral bruits  Neurological: grossly nonfocal   EKG: Normal sinus rhythm, minor nonspecific intraventricular conduction delay, lateral T wave inversion, QTC 430 ms  Lipid Panel     Component Value Date/Time   CHOL 128 01/09/2014 1128   TRIG 86.0 01/09/2014 1128   HDL 27.60* 01/09/2014 1128   CHOLHDL 5 01/09/2014 1128   VLDL 17.2 01/09/2014 1128   LDLCALC 83 01/09/2014 1128    BMET    Component Value Date/Time   NA 139 01/09/2014 1128   K 4.4 01/09/2014 1128   CL 105 01/09/2014 1128   CO2 28 01/09/2014 1128   GLUCOSE 116* 01/09/2014 1128   BUN 20 01/09/2014 1128   CREATININE 1.3 01/09/2014 1128   CREATININE 1.20 10/08/2013 0952   CALCIUM 9.4 01/09/2014 1128   GFRNONAA 66* 08/05/2011 0700   GFRAA 77* 08/05/2011 0700     ASSESSMENT AND PLAN  Chronic diastolic congestive heart failure  No recent episode of acute decompensation or need for increased diuretic therapy. He is reminded about the importance of sodium restriction, daily weight monitoring. Repeated the recommendation for weight loss and we discussed calorie restriction and physical exercise. He cannot exercise a lot because of severe knee arthritis. In fact right knee replacement has been recommended.   Atrial fibrillation He has only very brief palpitations and no sustained document atrial fibrillation since we started treatment with Tykosin. He tolerates AF poorly, probably due to significant diastolic dysfunction. Note that he had convincing evidence of tachycardia-related cardiomyopathy in 2011.  He is at relatively high risk of embolic events and should continue warfarin anticoagulation without interruption.  Reviewed the risk of drug interactions with dofetilide and with  warfarin.  Moderate aortic insufficiency  Do not think this is severe enough to cause congestive heart failure. I think his cardiomyopathy is primarily related  to systemic hypertension that wasn't sufficiently treated for most of his life. The hypertension is also likely the cause of his dilated aortic root, in turn the cause for his aortic valve insufficiency. The most important intervention is careful treatment of his high blood pressure.   SLEEP APNEA  He again reports 100% compliance with CPAP therapy  [name]  Sanda Klein, MD, [facility] [phone] office [phone] pager

## 2014-01-21 NOTE — Patient Instructions (Signed)
Dr. Croitoru recommends that you schedule a follow-up appointment in: 6 months    

## 2014-01-24 ENCOUNTER — Other Ambulatory Visit: Payer: Self-pay | Admitting: Cardiovascular Disease

## 2014-01-24 NOTE — Telephone Encounter (Signed)
Rx was sent to pharmacy electronically. 

## 2014-02-12 ENCOUNTER — Other Ambulatory Visit: Payer: Self-pay | Admitting: Family Medicine

## 2014-02-12 NOTE — Telephone Encounter (Signed)
Last RX of Temazepam was done on 01-04-14 .   RX printed for md to sign and fax

## 2014-02-19 ENCOUNTER — Telehealth: Payer: Self-pay | Admitting: Family Medicine

## 2014-02-19 NOTE — Telephone Encounter (Signed)
Patient is going to bring by the papers for md to fill out (we do this every year) Pt informed if md needs to see him to complete we will let him know

## 2014-02-19 NOTE — Telephone Encounter (Signed)
Caller name:[place] Abriel Relation to YE:BXID Call back [place]:  Reason for call:  Pt is requesting to speak with you, states he has some insurance papers that need to be filled out and has questions on how to fill it out.

## 2014-02-21 NOTE — Telephone Encounter (Signed)
Paperwork mailed  And a copy sent to be scanned into chart

## 2014-03-04 ENCOUNTER — Ambulatory Visit (INDEPENDENT_AMBULATORY_CARE_PROVIDER_SITE_OTHER): Payer: Medicare Other | Admitting: Pharmacist Clinician (PhC)/ Clinical Pharmacy Specialist

## 2014-03-04 DIAGNOSIS — Z7901 Long term (current) use of anticoagulants: Secondary | ICD-10-CM

## 2014-03-04 DIAGNOSIS — I4891 Unspecified atrial fibrillation: Secondary | ICD-10-CM | POA: Diagnosis not present

## 2014-03-04 LAB — POCT INR: INR: 2.3

## 2014-03-04 MED ORDER — WARFARIN SODIUM 4 MG PO TABS: ORAL_TABLET | ORAL | Status: DC

## 2014-03-07 ENCOUNTER — Telehealth: Payer: Self-pay | Admitting: Family Medicine

## 2014-03-07 ENCOUNTER — Other Ambulatory Visit: Payer: Self-pay | Admitting: Cardiovascular Disease

## 2014-03-07 NOTE — Telephone Encounter (Signed)
Rx was sent to pharmacy electronically. 

## 2014-03-07 NOTE — Telephone Encounter (Signed)
Patient informed of the same information

## 2014-03-07 NOTE — Telephone Encounter (Signed)
Caller name: [place]  Call back number:[phone]   Reason for call:  Wants to speak with [place] regarding insurance paperwork.  Advised patient that they were sent on 9/17, but he wants Alyse Low to confirm this is accurate, even though I told him the information.

## 2014-03-10 ENCOUNTER — Other Ambulatory Visit: Payer: Self-pay | Admitting: Cardiovascular Disease

## 2014-03-11 NOTE — Telephone Encounter (Signed)
Rx was sent to pharmacy electronically. 

## 2014-03-12 ENCOUNTER — Other Ambulatory Visit: Payer: Self-pay | Admitting: Cardiovascular Disease

## 2014-03-12 NOTE — Telephone Encounter (Signed)
Refilled #60 capsules with 10 refills on 03/07/2014

## 2014-03-27 ENCOUNTER — Other Ambulatory Visit: Payer: Self-pay | Admitting: Family Medicine

## 2014-03-27 ENCOUNTER — Other Ambulatory Visit: Payer: Self-pay | Admitting: Cardiovascular Disease

## 2014-03-27 NOTE — Telephone Encounter (Signed)
E-SENT PHARMACY  

## 2014-03-28 NOTE — Telephone Encounter (Signed)
Last Temazepam RX was done on 02-12-14 quantity 30 with 0 refills  rx printed for md to sign and fax

## 2014-04-15 ENCOUNTER — Ambulatory Visit (INDEPENDENT_AMBULATORY_CARE_PROVIDER_SITE_OTHER): Payer: Medicare Other | Admitting: Pharmacist Clinician (PhC)/ Clinical Pharmacy Specialist

## 2014-04-15 DIAGNOSIS — I4891 Unspecified atrial fibrillation: Secondary | ICD-10-CM

## 2014-04-15 DIAGNOSIS — Z7901 Long term (current) use of anticoagulants: Secondary | ICD-10-CM | POA: Diagnosis not present

## 2014-04-15 LAB — POCT INR: INR: 1.9

## 2014-04-22 DIAGNOSIS — R972 Elevated prostate specific antigen [PSA]: Secondary | ICD-10-CM | POA: Diagnosis not present

## 2014-04-22 DIAGNOSIS — E291 Testicular hypofunction: Secondary | ICD-10-CM | POA: Diagnosis not present

## 2014-04-28 ENCOUNTER — Other Ambulatory Visit: Payer: Self-pay | Admitting: Family Medicine

## 2014-04-29 DIAGNOSIS — E291 Testicular hypofunction: Secondary | ICD-10-CM | POA: Diagnosis not present

## 2014-04-29 DIAGNOSIS — R6882 Decreased libido: Secondary | ICD-10-CM | POA: Diagnosis not present

## 2014-04-29 DIAGNOSIS — N5201 Erectile dysfunction due to arterial insufficiency: Secondary | ICD-10-CM | POA: Diagnosis not present

## 2014-04-29 DIAGNOSIS — N401 Enlarged prostate with lower urinary tract symptoms: Secondary | ICD-10-CM | POA: Diagnosis not present

## 2014-04-29 DIAGNOSIS — R3912 Poor urinary stream: Secondary | ICD-10-CM | POA: Diagnosis not present

## 2014-04-29 DIAGNOSIS — R972 Elevated prostate specific antigen [PSA]: Secondary | ICD-10-CM | POA: Diagnosis not present

## 2014-04-29 NOTE — Telephone Encounter (Signed)
Follow-up Instructions   08.06.15 AVS   Return in about 3 months (around 04/12/2014) for lipid, renal, cbc, tsh, hepatic, hgba1c prior, annual exam.     Patient has appointment scheduled for 01.06.15 for CPE; Rx request to pharmacy/SLS

## 2014-05-13 ENCOUNTER — Ambulatory Visit (INDEPENDENT_AMBULATORY_CARE_PROVIDER_SITE_OTHER): Payer: Medicare Other | Admitting: Pharmacist Clinician (PhC)/ Clinical Pharmacy Specialist

## 2014-05-13 DIAGNOSIS — Z7901 Long term (current) use of anticoagulants: Secondary | ICD-10-CM

## 2014-05-13 DIAGNOSIS — I4891 Unspecified atrial fibrillation: Secondary | ICD-10-CM | POA: Diagnosis not present

## 2014-05-13 LAB — POCT INR: INR: 2.1

## 2014-05-27 DIAGNOSIS — E291 Testicular hypofunction: Secondary | ICD-10-CM | POA: Diagnosis not present

## 2014-05-28 ENCOUNTER — Other Ambulatory Visit: Payer: Self-pay | Admitting: Family Medicine

## 2014-05-29 NOTE — Telephone Encounter (Signed)
Last filled:  03/28/14 Amt: 30, 0 refills Last OV: 01/10/14  Please advise.

## 2014-05-30 NOTE — Telephone Encounter (Signed)
Rx printed, signed and faxed.

## 2014-06-05 ENCOUNTER — Other Ambulatory Visit: Payer: Self-pay | Admitting: Family Medicine

## 2014-06-06 NOTE — Telephone Encounter (Signed)
Rx denied faxed on the 05/29/14.//AB/CMA

## 2014-06-08 ENCOUNTER — Other Ambulatory Visit: Payer: Self-pay | Admitting: Family Medicine

## 2014-06-10 ENCOUNTER — Ambulatory Visit (INDEPENDENT_AMBULATORY_CARE_PROVIDER_SITE_OTHER): Payer: Medicare Other | Admitting: Pharmacist Clinician (PhC)/ Clinical Pharmacy Specialist

## 2014-06-10 DIAGNOSIS — I4891 Unspecified atrial fibrillation: Secondary | ICD-10-CM | POA: Diagnosis not present

## 2014-06-10 DIAGNOSIS — Z7901 Long term (current) use of anticoagulants: Secondary | ICD-10-CM

## 2014-06-10 LAB — POCT INR: INR: 2.4

## 2014-06-10 NOTE — Telephone Encounter (Signed)
Refill for temazepam refused. Script faxed previously on 05/29/2014. JG//CMA

## 2014-06-11 ENCOUNTER — Other Ambulatory Visit: Payer: Self-pay | Admitting: Cardiovascular Disease

## 2014-06-11 ENCOUNTER — Other Ambulatory Visit: Payer: Self-pay | Admitting: Cardiology

## 2014-06-11 NOTE — Telephone Encounter (Signed)
Rx(s) sent to pharmacy electronically.  

## 2014-06-13 ENCOUNTER — Other Ambulatory Visit (INDEPENDENT_AMBULATORY_CARE_PROVIDER_SITE_OTHER): Payer: Medicare Other

## 2014-06-13 ENCOUNTER — Other Ambulatory Visit: Payer: Self-pay

## 2014-06-13 DIAGNOSIS — Z0000 Encounter for general adult medical examination without abnormal findings: Secondary | ICD-10-CM

## 2014-06-13 DIAGNOSIS — I10 Essential (primary) hypertension: Secondary | ICD-10-CM

## 2014-06-13 DIAGNOSIS — E119 Type 2 diabetes mellitus without complications: Secondary | ICD-10-CM

## 2014-06-13 DIAGNOSIS — E785 Hyperlipidemia, unspecified: Secondary | ICD-10-CM

## 2014-06-13 DIAGNOSIS — R5383 Other fatigue: Secondary | ICD-10-CM

## 2014-06-13 LAB — RENAL FUNCTION PANEL
Albumin: 4 g/dL (ref 3.5–5.2)
BUN: 23 mg/dL (ref 6–23)
CHLORIDE: 107 meq/L (ref 96–112)
CO2: 26 mEq/L (ref 19–32)
CREATININE: 1.3 mg/dL (ref 0.4–1.5)
Calcium: 9.8 mg/dL (ref 8.4–10.5)
GFR: 73.37 mL/min (ref >60.00)
Glucose, Bld: 84 mg/dL (ref 70–99)
PHOSPHORUS: 3.4 mg/dL (ref 2.3–4.6)
Potassium: 4.5 mEq/L (ref 3.5–5.1)
Sodium: 141 mEq/L (ref 135–145)

## 2014-06-13 LAB — CBC
HEMATOCRIT: 43.4 % (ref 39.0–52.0)
Hemoglobin: 13.9 g/dL (ref 13.0–17.0)
MCHC: 32 g/dL (ref 30.0–36.0)
MCV: 79.9 fl (ref 78.0–100.0)
Platelets: 211 10*3/uL (ref 150.0–400.0)
RBC: 5.43 Mil/uL (ref 4.22–5.81)
RDW: 16.1 % — AB (ref 11.5–15.5)
WBC: 5.2 10*3/uL (ref 4.0–10.5)

## 2014-06-13 LAB — HEPATIC FUNCTION PANEL
ALK PHOS: 40 U/L (ref 39–117)
ALT: 13 U/L (ref 0–53)
AST: 18 U/L (ref 0–37)
Albumin: 4 g/dL (ref 3.5–5.2)
BILIRUBIN TOTAL: 0.7 mg/dL (ref 0.2–1.2)
Bilirubin, Direct: 0.1 mg/dL (ref 0.0–0.3)
Total Protein: 7.4 g/dL (ref 6.0–8.3)

## 2014-06-13 LAB — HEMOGLOBIN A1C: Hgb A1c MFr Bld: 6.8 % — ABNORMAL HIGH (ref 4.6–6.5)

## 2014-06-13 LAB — LIPID PANEL
CHOL/HDL RATIO: 5
Cholesterol: 144 mg/dL (ref 0–200)
HDL: 28.3 mg/dL — AB (ref >39.00)
LDL CALC: 91 mg/dL (ref 0–99)
NonHDL: 115.7
TRIGLYCERIDES: 122 mg/dL (ref 0.0–149.0)
VLDL: 24.4 mg/dL (ref 0.0–40.0)

## 2014-06-13 LAB — TSH: TSH: 1.16 u[IU]/mL (ref 0.35–4.50)

## 2014-06-17 ENCOUNTER — Ambulatory Visit (INDEPENDENT_AMBULATORY_CARE_PROVIDER_SITE_OTHER): Payer: Medicare Other | Admitting: Family Medicine

## 2014-06-17 ENCOUNTER — Encounter: Payer: Self-pay | Admitting: Family Medicine

## 2014-06-17 VITALS — BP 136/78 | HR 67 | Temp 98.3°F | Ht 70.0 in | Wt 228.4 lb

## 2014-06-17 DIAGNOSIS — Z0000 Encounter for general adult medical examination without abnormal findings: Secondary | ICD-10-CM | POA: Diagnosis not present

## 2014-06-17 DIAGNOSIS — E1169 Type 2 diabetes mellitus with other specified complication: Secondary | ICD-10-CM

## 2014-06-17 DIAGNOSIS — M109 Gout, unspecified: Secondary | ICD-10-CM | POA: Diagnosis not present

## 2014-06-17 DIAGNOSIS — M25562 Pain in left knee: Secondary | ICD-10-CM | POA: Diagnosis not present

## 2014-06-17 DIAGNOSIS — G4700 Insomnia, unspecified: Secondary | ICD-10-CM | POA: Diagnosis not present

## 2014-06-17 DIAGNOSIS — E785 Hyperlipidemia, unspecified: Secondary | ICD-10-CM | POA: Diagnosis not present

## 2014-06-17 DIAGNOSIS — G4730 Sleep apnea, unspecified: Secondary | ICD-10-CM

## 2014-06-17 DIAGNOSIS — I5032 Chronic diastolic (congestive) heart failure: Secondary | ICD-10-CM

## 2014-06-17 DIAGNOSIS — I4891 Unspecified atrial fibrillation: Secondary | ICD-10-CM | POA: Diagnosis not present

## 2014-06-17 DIAGNOSIS — Z79899 Other long term (current) drug therapy: Secondary | ICD-10-CM | POA: Diagnosis not present

## 2014-06-17 DIAGNOSIS — D649 Anemia, unspecified: Secondary | ICD-10-CM

## 2014-06-17 DIAGNOSIS — M25561 Pain in right knee: Secondary | ICD-10-CM

## 2014-06-17 DIAGNOSIS — I10 Essential (primary) hypertension: Secondary | ICD-10-CM | POA: Diagnosis not present

## 2014-06-17 DIAGNOSIS — F411 Generalized anxiety disorder: Secondary | ICD-10-CM

## 2014-06-17 DIAGNOSIS — Z79891 Long term (current) use of opiate analgesic: Secondary | ICD-10-CM | POA: Diagnosis not present

## 2014-06-17 DIAGNOSIS — J449 Chronic obstructive pulmonary disease, unspecified: Secondary | ICD-10-CM

## 2014-06-17 DIAGNOSIS — R1013 Epigastric pain: Secondary | ICD-10-CM | POA: Diagnosis not present

## 2014-06-17 DIAGNOSIS — Z9189 Other specified personal risk factors, not elsewhere classified: Secondary | ICD-10-CM

## 2014-06-17 DIAGNOSIS — E669 Obesity, unspecified: Secondary | ICD-10-CM

## 2014-06-17 DIAGNOSIS — E119 Type 2 diabetes mellitus without complications: Secondary | ICD-10-CM | POA: Diagnosis not present

## 2014-06-17 LAB — H. PYLORI ANTIBODY, IGG: H PYLORI IGG: NEGATIVE

## 2014-06-17 LAB — URIC ACID: URIC ACID, SERUM: 6.4 mg/dL (ref 4.0–7.8)

## 2014-06-17 MED ORDER — HYDROCODONE-ACETAMINOPHEN 10-325 MG PO TABS: 1.0000 | ORAL_TABLET | Freq: Three times a day (TID) | ORAL | Status: DC | PRN

## 2014-06-17 MED ORDER — LORAZEPAM 1 MG PO TABS: 1.0000 mg | ORAL_TABLET | Freq: Two times a day (BID) | ORAL | Status: DC | PRN

## 2014-06-17 MED ORDER — TEMAZEPAM 30 MG PO CAPS: ORAL_CAPSULE | ORAL | Status: DC

## 2014-06-17 NOTE — Progress Notes (Signed)
Pre visit review using our clinic review tool, if applicable. No additional management support is needed unless otherwise documented below in the visit note. 

## 2014-06-17 NOTE — Patient Instructions (Addendum)
Probiotics daily such as Digestive Advantage or [employer]   Preventive Care for Adults A healthy lifestyle and preventive care can promote health and wellness. Preventive health guidelines for men include the following key practices:  A routine yearly physical is a good way to check with your health care provider about your health and preventative screening. It is a chance to share any concerns and updates on your health and to receive a thorough exam.  Visit your dentist for a routine exam and preventative care every 6 months. Brush your teeth twice a day and floss once a day. Good oral hygiene prevents tooth decay and gum disease.  The frequency of eye exams is based on your age, health, family medical history, use of contact lenses, and other factors. Follow your health care provider's recommendations for frequency of eye exams.  Eat a healthy diet. Foods such as vegetables, fruits, whole grains, low-fat dairy products, and lean protein foods contain the nutrients you need without too many calories. Decrease your intake of foods high in solid fats, added sugars, and salt. Eat the right amount of calories for you.Get information about a proper diet from your health care provider, if necessary.  Regular physical exercise is one of the most important things you can do for your health. Most adults should get at least 150 minutes of moderate-intensity exercise (any activity that increases your heart rate and causes you to sweat) each week. In addition, most adults need muscle-strengthening exercises on 2 or more days a week.  Maintain a healthy weight. The body mass index (BMI) is a screening tool to identify possible weight problems. It provides an estimate of body fat based on height and weight. Your health care provider can find your BMI and can help you achieve or maintain a healthy weight.For adults 20 years and older:  A BMI below 18.5 is considered underweight.  A BMI of 18.5  to 24.9 is normal.  A BMI of 25 to 29.9 is considered overweight.  A BMI of 30 and above is considered obese.  Maintain normal blood lipids and cholesterol levels by exercising and minimizing your intake of saturated fat. Eat a balanced diet with plenty of fruit and vegetables. Blood tests for lipids and cholesterol should begin at age 26 and be repeated every 5 years. If your lipid or cholesterol levels are high, you are over 50, or you are at high risk for heart disease, you may need your cholesterol levels checked more frequently.Ongoing high lipid and cholesterol levels should be treated with medicines if diet and exercise are not working.  If you smoke, find out from your health care provider how to quit. If you do not use tobacco, do not start.  Lung cancer screening is recommended for adults aged 21-80 years who are at high risk for developing lung cancer because of a history of smoking. A yearly low-dose CT scan of the lungs is recommended for people who have at least a 30-pack-year history of smoking and are a current smoker or have quit within the past 15 years. A pack year of smoking is smoking an average of 1 pack of cigarettes a day for 1 year (for example: 1 pack a day for 30 years or 2 packs a day for 15 years). Yearly screening should continue until the smoker has stopped smoking for at least 15 years. Yearly screening should be stopped for people who develop a health problem that would prevent them from having lung cancer treatment.  If you choose to drink alcohol, do not have more than 2 drinks per day. One drink is considered to be 12 ounces (355 mL) of beer, 5 ounces (148 mL) of wine, or 1.5 ounces (44 mL) of liquor.  Avoid use of street drugs. Do not share needles with anyone. Ask for help if you need support or instructions about stopping the use of drugs.  High blood pressure causes heart disease and increases the risk of stroke. Your blood pressure should be checked at least  every 1-2 years. Ongoing high blood pressure should be treated with medicines, if weight loss and exercise are not effective.  If you are 23-49 years old, ask your health care provider if you should take aspirin to prevent heart disease.  Diabetes screening involves taking a blood sample to check your fasting blood sugar level. This should be done once every 3 years, after age 2, if you are within normal weight and without risk factors for diabetes. Testing should be considered at a younger age or be carried out more frequently if you are overweight and have at least 1 risk factor for diabetes.  Colorectal cancer can be detected and often prevented. Most routine colorectal cancer screening begins at the age of 28 and continues through age 31. However, your health care provider may recommend screening at an earlier age if you have risk factors for colon cancer. On a yearly basis, your health care provider may provide home test kits to check for hidden blood in the stool. Use of a small camera at the end of a tube to directly examine the colon (sigmoidoscopy or colonoscopy) can detect the earliest forms of colorectal cancer. Talk to your health care provider about this at age 91, when routine screening begins. Direct exam of the colon should be repeated every 5-10 years through age 24, unless early forms of precancerous polyps or small growths are found.  People who are at an increased risk for hepatitis B should be screened for this virus. You are considered at high risk for hepatitis B if:  You were born in a country where hepatitis B occurs often. Talk with your health care provider about which countries are considered high risk.  Your parents were born in a high-risk country and you have not received a shot to protect against hepatitis B (hepatitis B vaccine).  You have HIV or AIDS.  You use needles to inject street drugs.  You live with, or have sex with, someone who has hepatitis B.  You are  a man who has sex with other men (MSM).  You get hemodialysis treatment.  You take certain medicines for conditions such as cancer, organ transplantation, and autoimmune conditions.  Hepatitis C blood testing is recommended for all people born from [birth year] through 1965 and any individual with known risks for hepatitis C.  Practice safe sex. Use condoms and avoid high-risk sexual practices to reduce the spread of sexually transmitted infections (STIs). STIs include gonorrhea, chlamydia, syphilis, trichomonas, herpes, HPV, and human immunodeficiency virus (HIV). Herpes, HIV, and HPV are viral illnesses that have no cure. They can result in disability, cancer, and death.  If you are at risk of being infected with HIV, it is recommended that you take a prescription medicine daily to prevent HIV infection. This is called preexposure prophylaxis (PrEP). You are considered at risk if:  You are a man who has sex with other men (MSM) and have other risk factors.  You are a heterosexual man,  are sexually active, and are at increased risk for HIV infection.  You take drugs by injection.  You are sexually active with a partner who has HIV.  Talk with your health care provider about whether you are at high risk of being infected with HIV. If you choose to begin PrEP, you should first be tested for HIV. You should then be tested every 3 months for as long as you are taking PrEP.  A one-time screening for abdominal aortic aneurysm (AAA) and surgical repair of large AAAs by ultrasound are recommended for men ages 28 to 40 years who are current or former smokers.  Healthy men should no longer receive prostate-specific antigen (PSA) blood tests as part of routine cancer screening. Talk with your health care provider about prostate cancer screening.  Testicular cancer screening is not recommended for adult males who have no symptoms. Screening includes self-exam, a health care provider exam, and other screening  tests. Consult with your health care provider about any symptoms you have or any concerns you have about testicular cancer.  Use sunscreen. Apply sunscreen liberally and repeatedly throughout the day. You should seek shade when your shadow is shorter than you. Protect yourself by wearing long sleeves, pants, a wide-brimmed hat, and sunglasses year round, whenever you are outdoors.  Once a month, do a whole-body skin exam, using a mirror to look at the skin on your back. Tell your health care provider about new moles, moles that have irregular borders, moles that are larger than a pencil eraser, or moles that have changed in shape or color.  Stay current with required vaccines (immunizations).  Influenza vaccine. All adults should be immunized every year.  Tetanus, diphtheria, and acellular pertussis (Td, Tdap) vaccine. An adult who has not previously received Tdap or who does not know his vaccine status should receive 1 dose of Tdap. This initial dose should be followed by tetanus and diphtheria toxoids (Td) booster doses every 10 years. Adults with an unknown or incomplete history of completing a 3-dose immunization series with Td-containing vaccines should begin or complete a primary immunization series including a Tdap dose. Adults should receive a Td booster every 10 years.  Varicella vaccine. An adult without evidence of immunity to varicella should receive 2 doses or a second dose if he has previously received 1 dose.  Human papillomavirus (HPV) vaccine. Males aged 2-21 years who have not received the vaccine previously should receive the 3-dose series. Males aged 22-26 years may be immunized. Immunization is recommended through the age of 42 years for any male who has sex with males and did not get any or all doses earlier. Immunization is recommended for any person with an immunocompromised condition through the age of 1 years if he did not get any or all doses earlier. During the 3-dose  series, the second dose should be obtained 4-8 weeks after the first dose. The third dose should be obtained 24 weeks after the first dose and 16 weeks after the second dose.  Zoster vaccine. One dose is recommended for adults aged 59 years or older unless certain conditions are present.  Measles, mumps, and rubella (MMR) vaccine. Adults born before [birth year] generally are considered immune to measles and mumps. Adults born in [birth year] or later should have 1 or more doses of MMR vaccine unless there is a contraindication to the vaccine or there is laboratory evidence of immunity to each of the three diseases. A routine second dose of MMR vaccine should be obtained  at least 28 days after the first dose for students attending postsecondary schools, health care workers, or international travelers. People who received inactivated measles vaccine or an unknown type of measles vaccine during 1963-1967 should receive 2 doses of MMR vaccine. People who received inactivated mumps vaccine or an unknown type of mumps vaccine before 1979 and are at high risk for mumps infection should consider immunization with 2 doses of MMR vaccine. Unvaccinated health care workers born before [birth year] who lack laboratory evidence of measles, mumps, or rubella immunity or laboratory confirmation of disease should consider measles and mumps immunization with 2 doses of MMR vaccine or rubella immunization with 1 dose of MMR vaccine.  Pneumococcal 13-valent conjugate (PCV13) vaccine. When indicated, a person who is uncertain of his immunization history and has no record of immunization should receive the PCV13 vaccine. An adult aged 79 years or older who has certain medical conditions and has not been previously immunized should receive 1 dose of PCV13 vaccine. This PCV13 should be followed with a dose of pneumococcal polysaccharide (PPSV23) vaccine. The PPSV23 vaccine dose should be obtained at least 8 weeks after the dose of PCV13 vaccine. An adult  aged 42 years or older who has certain medical conditions and previously received 1 or more doses of PPSV23 vaccine should receive 1 dose of PCV13. The PCV13 vaccine dose should be obtained 1 or more years after the last PPSV23 vaccine dose.  Pneumococcal polysaccharide (PPSV23) vaccine. When PCV13 is also indicated, PCV13 should be obtained first. All adults aged 48 years and older should be immunized. An adult younger than age 80 years who has certain medical conditions should be immunized. Any person who resides in a nursing home or long-term care facility should be immunized. An adult smoker should be immunized. People with an immunocompromised condition and certain other conditions should receive both PCV13 and PPSV23 vaccines. People with human immunodeficiency virus (HIV) infection should be immunized as soon as possible after diagnosis. Immunization during chemotherapy or radiation therapy should be avoided. Routine use of PPSV23 vaccine is not recommended for American Indians, [place]atives, or people younger than 65 years unless there are medical conditions that require PPSV23 vaccine. When indicated, people who have unknown immunization and have no record of immunization should receive PPSV23 vaccine. One-time revaccination 5 years after the first dose of PPSV23 is recommended for people aged 19-64 years who have chronic kidney failure, nephrotic syndrome, asplenia, or immunocompromised conditions. People who received 1-2 doses of PPSV23 before age 76 years should receive another dose of PPSV23 vaccine at age 15 years or later if at least 5 years have passed since the previous dose. Doses of PPSV23 are not needed for people immunized with PPSV23 at or after age 69 years.  Meningococcal vaccine. Adults with asplenia or persistent complement component deficiencies should receive 2 doses of quadrivalent meningococcal conjugate (MenACWY-D) vaccine. The doses should be obtained at least 2 months apart.  Microbiologists working with certain meningococcal bacteria, [employer] recruits, people at risk during an outbreak, and people who travel to or live in countries with a high rate of meningitis should be immunized. A first-year college student up through age 11 years who is living in a residence hall should receive a dose if he did not receive a dose on or after his 16th birthday. Adults who have certain high-risk conditions should receive one or more doses of vaccine.  Hepatitis A vaccine. Adults who wish to be protected from this disease, have certain high-risk conditions,  work with hepatitis A-infected animals, work in hepatitis A research labs, or travel to or work in countries with a high rate of hepatitis A should be immunized. Adults who were previously unvaccinated and who anticipate close contact with an international adoptee during the first 60 days after arrival in the [place] from a country with a high rate of hepatitis A should be immunized.  Hepatitis B vaccine. Adults should be immunized if they wish to be protected from this disease, have certain high-risk conditions, may be exposed to blood or other infectious body fluids, are household contacts or sex partners of hepatitis B positive people, are clients or workers in certain care facilities, or travel to or work in countries with a high rate of hepatitis B.  Haemophilus influenzae type b (Hib) vaccine. A previously unvaccinated person with asplenia or sickle cell disease or having a scheduled splenectomy should receive 1 dose of Hib vaccine. Regardless of previous immunization, a recipient of a hematopoietic stem cell transplant should receive a 3-dose series 6-12 months after his successful transplant. Hib vaccine is not recommended for adults with HIV infection. Preventive Service / Frequency Ages 27 to 89  Blood pressure check.** / Every 1 to 2 years.  Lipid and cholesterol check.** / Every 5 years beginning at age  37.  Hepatitis C blood test.** / For any individual with known risks for hepatitis C.  Skin self-exam. / Monthly.  Influenza vaccine. / Every year.  Tetanus, diphtheria, and acellular pertussis (Tdap, Td) vaccine.** / Consult your health care provider. 1 dose of Td every 10 years.  Varicella vaccine.** / Consult your health care provider.  HPV vaccine. / 3 doses over 6 months, if 55 or younger.  Measles, mumps, rubella (MMR) vaccine.** / You need at least 1 dose of MMR if you were born in 1957 or later. You may also need a second dose.  Pneumococcal 13-valent conjugate (PCV13) vaccine.** / Consult your health care provider.  Pneumococcal polysaccharide (PPSV23) vaccine.** / 1 to 2 doses if you smoke cigarettes or if you have certain conditions.  Meningococcal vaccine.** / 1 dose if you are age 31 to 45 years and a Market researcher living in a residence hall, or have one of several medical conditions. You may also need additional booster doses.  Hepatitis A vaccine.** / Consult your health care provider.  Hepatitis B vaccine.** / Consult your health care provider.  Haemophilus influenzae type b (Hib) vaccine.** / Consult your health care provider. Ages 5 to 11  Blood pressure check.** / Every 1 to 2 years.  Lipid and cholesterol check.** / Every 5 years beginning at age 32.  Lung cancer screening. / Every year if you are aged 23-80 years and have a 30-pack-year history of smoking and currently smoke or have quit within the past 15 years. Yearly screening is stopped once you have quit smoking for at least 15 years or develop a health problem that would prevent you from having lung cancer treatment.  Fecal occult blood test (FOBT) of stool. / Every year beginning at age 100 and continuing until age 21. You may not have to do this test if you get a colonoscopy every 10 years.  Flexible sigmoidoscopy** or colonoscopy.** / Every 5 years for a flexible sigmoidoscopy or every  10 years for a colonoscopy beginning at age 29 and continuing until age 9.  Hepatitis C blood test.** / For all people born from [birth year] through 1965 and any individual with known risks for  hepatitis C.  Skin self-exam. / Monthly.  Influenza vaccine. / Every year.  Tetanus, diphtheria, and acellular pertussis (Tdap/Td) vaccine.** / Consult your health care provider. 1 dose of Td every 10 years.  Varicella vaccine.** / Consult your health care provider.  Zoster vaccine.** / 1 dose for adults aged 64 years or older.  Measles, mumps, rubella (MMR) vaccine.** / You need at least 1 dose of MMR if you were born in 1955 or later. You may also need a second dose.  Pneumococcal 13-valent conjugate (PCV13) vaccine.** / Consult your health care provider.  Pneumococcal polysaccharide (PPSV23) vaccine.** / 1 to 2 doses if you smoke cigarettes or if you have certain conditions.  Meningococcal vaccine.** / Consult your health care provider.  Hepatitis A vaccine.** / Consult your health care provider.  Hepatitis B vaccine.** / Consult your health care provider.  Haemophilus influenzae type b (Hib) vaccine.** / Consult your health care provider. Ages 58 and over  Blood pressure check.** / Every 1 to 2 years.  Lipid and cholesterol check.**/ Every 5 years beginning at age 21.  Lung cancer screening. / Every year if you are aged 49-80 years and have a 30-pack-year history of smoking and currently smoke or have quit within the past 15 years. Yearly screening is stopped once you have quit smoking for at least 15 years or develop a health problem that would prevent you from having lung cancer treatment.  Fecal occult blood test (FOBT) of stool. / Every year beginning at age 76 and continuing until age 92. You may not have to do this test if you get a colonoscopy every 10 years.  Flexible sigmoidoscopy** or colonoscopy.** / Every 5 years for a flexible sigmoidoscopy or every 10 years for a colonoscopy  beginning at age 65 and continuing until age 93.  Hepatitis C blood test.** / For all people born from [birth year] through 1963 and any individual with known risks for hepatitis C.  Abdominal aortic aneurysm (AAA) screening.** / A one-time screening for ages 82 to 60 years who are current or former smokers.  Skin self-exam. / Monthly.  Influenza vaccine. / Every year.  Tetanus, diphtheria, and acellular pertussis (Tdap/Td) vaccine.** / 1 dose of Td every 10 years.  Varicella vaccine.** / Consult your health care provider.  Zoster vaccine.** / 1 dose for adults aged 81 years or older.  Pneumococcal 13-valent conjugate (PCV13) vaccine.** / Consult your health care provider.  Pneumococcal polysaccharide (PPSV23) vaccine.** / 1 dose for all adults aged 37 years and older.  Meningococcal vaccine.** / Consult your health care provider.  Hepatitis A vaccine.** / Consult your health care provider.  Hepatitis B vaccine.** / Consult your health care provider.  Haemophilus influenzae type b (Hib) vaccine.** / Consult your health care provider. **Family history and personal history of risk and conditions may change your health care provider's recommendations. Document Released: 07/20/2001 Document Revised: 05/29/2013 Document Reviewed: 10/19/2010 [facility] Patient Information 2015 [place], [place]. This information is not intended to replace advice given to you by your health care provider. Make sure you discuss any questions you have with your health care provider.

## 2014-06-17 NOTE — Assessment & Plan Note (Signed)
Well controlled, no changes to meds. Encouraged heart healthy diet such as the DASH diet and exercise as tolerated.  °

## 2014-06-17 NOTE — Progress Notes (Signed)
[name]  [id number] 12/08/1950 06/17/2014      Progress Note-Follow Up  Subjective  Chief Complaint  Chief Complaint  Patient presents with  . Annual Exam    HPI  Patient is a 64 y.o. male in today for routine medical care. He is in today for annual exam. He struggles with persistent shortness of breath but it is stable. He struggles with persistent low back pain and knee pain but it is stable. He's not had a recent illness or COPD exacerbation. He uses his CPAP routinely. Complains of fatigue and fullness in his neck. Denies CP/palp/HA/congestion/fevers/GI or GU c/o. Taking meds as prescribed.No recent acute illness or hospitalization.  Past Medical History  Diagnosis Date  . Hypertension   . Hyperlipidemia   . Atrial fibrillation   . UNSPECIFIED ANEMIA 01/05/2010  . TESTICULAR HYPOFUNCTION 01/05/2010  . PULMONARY FUNCTION TESTS, ABNORMAL 02/02/2010  . PERIPHERAL NEUROPATHY 01/05/2010  . OBESITY NOS 01/25/2007  . NEUROMA 01/05/2010  . INSOMNIA, HX OF 01/25/2007  . HYPERTENSION 01/25/2007  . FATIGUE, CHRONIC 11/11/2009  . ERECTILE DYSFUNCTION 01/25/2007  . DYSPNEA 12/19/2009  . CHF 12/05/2009  . CHEST PAIN-UNSPECIFIED 11/11/2009  . BENIGN PROSTATIC HYPERTROPHY, HX OF 12/05/2009  . Atrial flutter 12/05/2009  . AORTIC STENOSIS, MODERATE 12/05/2009  . Knee pain, right 12/28/2010  . Hypoxia 05/05/2011  . Heart murmur   . SLEEP APNEA 04/06/2010    uses cpap  . DM 12/05/2009  . Superficial thrombophlebitis of left leg 09/07/2011  . Atypical chest pain 11/08/2011  . 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 Hyperkalemia 04/18/2012  . Edema 04/18/2012  . Debility 04/18/2012  . Toe pain, right 07/10/2012  . Gout 07/10/2012    Past Surgical History  Procedure Laterality Date  . Bilat foot surgery removed part of 5th metatarsal to corect curvature of toes    . Cardiac catheterization  10/16/2009    nonischemic cardiomyopathy    Family History  Problem Relation Age of Onset  . Clotting disorder Mother   .  Heart disease Mother     s/p MI  . Heart attack Mother   . Hypertension Mother   . Diabetes Mother   . Hyperlipidemia Mother   . Stroke Mother   . Cancer Father     ? lung  . Lung disease Father     smoker  . Diabetes Sister   . Hypertension Sister     smoker  . Leukemia Maternal Grandmother     ?  [name] Aneurysm Sister     brain  . Other Sister     clipped  . Seizures Sister     d/o w/aneurysm/ smoker    History   Social History  . Marital Status: Married    Spouse Name: N/A    Number of Children: N/A  . Years of Education: N/A   Occupational History  . Not on file.   Social History Main Topics  . Smoking status: Former Smoker -- 1.00 packs/day for 10 years    Quit date: 06/07/1980  . Smokeless tobacco: Never Used  . Alcohol Use: No  . Drug Use: No  . Sexual Activity: Not Currently   Other Topics Concern  . Not on file   Social History Narrative    Current Outpatient Prescriptions on File Prior to Visit  Medication Sig Dispense Refill  . albuterol (PROVENTIL HFA;VENTOLIN HFA) 108 (90 BASE) MCG/ACT inhaler Inhale 2 puffs into the lungs every 6 (six) hours as needed for wheezing. 3  Inhaler 3  . allopurinol (ZYLOPRIM) 300 MG tablet TAKE ONE TABLET BY MOUTH ONCE DAILY 90 tablet 0  . aspirin EC 81 MG tablet Take 81 mg by mouth daily.    . benazepril (LOTENSIN) 40 MG tablet TAKE ONE TABLET BY MOUTH ONCE DAILY 90 tablet 3  . calcium carbonate (TUMS - DOSED IN MG ELEMENTAL CALCIUM) 500 MG chewable tablet Chew 1 tablet by mouth daily as needed. For heartburn    . COLCRYS 0.6 MG tablet TAKE ONE TABLET BY MOUTH EVERY TWO HOURS FOR FOUR DOSES OR PAIN RELIEF TODAY. THEN TAKE ONE TABLET BY MOUTH ONCE DAILY 35 tablet 0  . fish oil-omega-3 fatty acids 1000 MG capsule Take 1 g by mouth daily.     . furosemide (LASIX) 40 MG tablet TAKE ONE TABLET BY MOUTH ONCE DAILY 30 tablet 7  . glyBURIDE (DIABETA) 5 MG tablet Take 1 tablet (5 mg total) by mouth daily with breakfast. 90 tablet  1  . HYDROcodone-acetaminophen (NORCO) 10-325 MG per tablet Take 1 tablet by mouth every 8 (eight) hours as needed for moderate pain or severe pain. 90 tablet 0  . KLOR-CON M20 20 MEQ tablet TAKE ONE-HALF TO ONE TABLET BY MOUTH ONCE DAILY AS DIRECTED 30 tablet 11  . LORazepam (ATIVAN) 1 MG tablet Take 1 tablet (1 mg total) by mouth 2 (two) times daily as needed for anxiety. 60 tablet 2  . metFORMIN (GLUCOPHAGE) 1000 MG tablet TAKE ONE TABLET BY MOUTH TWICE DAILY WITH MEALS 60 tablet 2  . metoprolol (LOPRESSOR) 100 MG tablet TAKE ONE TABLET BY MOUTH TWICE DAILY 180 tablet 3  . Multiple Vitamins-Minerals (MULTIVITAMIN PO) Take 1 tablet by mouth daily.    . naproxen (NAPROSYN) 375 MG tablet TAKE ONE TABLET BY MOUTH ONCE DAILY AS NEEDED FOR PAIN 30 tablet 4  . potassium chloride SA (KLOR-CON M20) 20 MEQ tablet Take 1 tablet (20 mEq total) by mouth daily. 30 tablet 5  . pravastatin (PRAVACHOL) 40 MG tablet TAKE ONE TABLET BY MOUTH ONCE DAILY 30 tablet 7  . temazepam (RESTORIL) 30 MG capsule TAKE ONE CAPSULE BY MOUTH AT BEDTIME AS NEEDED FOR  SLEEP  OR  ANXIETY 30 capsule 3  . Testosterone 10 MG/ACT (2%) GEL Use as directed    . TIKOSYN 500 MCG capsule TAKE 1 CAPSULE BY MOUTH EVERY 12 HOURS 60 capsule 10  . warfarin (COUMADIN) 4 MG tablet TAKE ONE TABLET BY MOUTH ONCE DAILY OR AS DIRECTED 90 tablet 1   No current facility-administered medications on file prior to visit.    Allergies  Allergen Reactions  . Sulfonamide Derivatives Other (See Comments)    unknown reaction  . Zolpidem     Excessive, prolonged sedation    Review of Systems  Review of Systems  Constitutional: Positive for malaise/fatigue. Negative for fever and chills.  HENT: Negative for congestion, hearing loss and nosebleeds.   Eyes: Negative for discharge.  Respiratory: Positive for shortness of breath. Negative for cough, sputum production and wheezing.   Cardiovascular: Negative for chest pain, palpitations and leg  swelling.  Gastrointestinal: Positive for abdominal pain. Negative for heartburn, nausea, vomiting, diarrhea, constipation and blood in stool.  Genitourinary: Negative for dysuria, urgency, frequency and hematuria.  Musculoskeletal: Negative for myalgias, back pain and falls.  Skin: Negative for rash.  Neurological: Negative for dizziness, tremors, sensory change, focal weakness, loss of consciousness, weakness and headaches.  Endo/Heme/Allergies: Negative for polydipsia. Does not bruise/bleed easily.  Psychiatric/Behavioral: Negative for depression and suicidal  ideas. The patient has insomnia. The patient is not nervous/anxious.     Objective  BP 136/78 mmHg  Pulse 67  Temp(Src) 98.3 F (36.8 C) (Oral)  Ht 5\' 10"  (1.778 m)  Wt 228 lb 6.4 oz (103.602 kg)  BMI 32.77 kg/m2  SpO2 98%  Physical Exam  Physical Exam  Constitutional: He is oriented to person, place, and time and well-developed, well-nourished, and in no distress. No distress.  HENT:  Head: Normocephalic and atraumatic.  Eyes: Conjunctivae are normal.  Neck: Neck supple. No thyromegaly present.  Cardiovascular: Normal rate and regular rhythm.   Murmur heard. Pulmonary/Chest: Effort normal and breath sounds normal. No respiratory distress.  Abdominal: Soft. Bowel sounds are normal. He exhibits no distension and no mass. There is no tenderness.  Musculoskeletal: He exhibits no edema.  Neurological: He is alert and oriented to person, place, and time.  Skin: Skin is warm.  Psychiatric: Memory, affect and judgment normal.    Lab Results  Component Value Date   TSH 1.16 06/13/2014   Lab Results  Component Value Date   WBC 5.2 06/13/2014   HGB 13.9 06/13/2014   HCT 43.4 06/13/2014   MCV 79.9 06/13/2014   PLT 211.0 06/13/2014   Lab Results  Component Value Date   CREATININE 1.3 06/13/2014   BUN 23 06/13/2014   NA 141 06/13/2014   K 4.5 06/13/2014   CL 107 06/13/2014   CO2 26 06/13/2014   Lab Results    Component Value Date   ALT 13 06/13/2014   AST 18 06/13/2014   ALKPHOS 40 06/13/2014   BILITOT 0.7 06/13/2014   Lab Results  Component Value Date   CHOL 144 06/13/2014   Lab Results  Component Value Date   HDL 28.30* 06/13/2014   Lab Results  Component Value Date   LDLCALC 91 06/13/2014   Lab Results  Component Value Date   TRIG 122.0 06/13/2014   Lab Results  Component Value Date   CHOLHDL 5 06/13/2014     Assessment & Plan  Essential hypertension Well controlled, no changes to meds. Encouraged heart healthy diet such as the DASH diet and exercise as tolerated.    Obesity Encouraged DASH diet, decrease po intake and increase exercise as tolerated. Needs 7-8 hours of sleep nightly. Avoid trans fats, eat small, frequent meals every 4-5 hours with lean proteins, complex carbs and healthy fats. Minimize simple carbs,    Sleep apnea Uses CPAP routinely   Anemia resolved   Hyperlipidemia Tolerating statin, encouraged heart healthy diet, avoid trans fats, minimize simple carbs and saturated fats. Increase exercise as tolerated   Diabetes mellitus type 2 in obese [id number] acceptable, minimize simple carbs. Increase exercise as tolerated. Continue current meds   COPD (chronic obstructive pulmonary disease) No recent exacerbations   History of other specified conditions presenting hazards to health Encouraged good sleep hygiene such as dark, quiet room. No blue/green glowing lights such as computer screens in bedroom. No alcohol or stimulants in evening. Cut down on caffeine as able. Regular exercise is helpful but not just prior to bed time. Only effective medicine has been Restoril   Chronic diastolic congestive heart failure No recent exacerbations. Has appt with cardiology soon, no change in meds   Atrial fibrillation, converted to SR after 2nd dose of Tikosyn. RRR today   Medicare annual wellness visit, subsequent Patient denies any difficulties at  home. No trouble with ADLs, depression or falls. No recent changes to vision or hearing. Is UTD  with immunizations. Is UTD with screening. Discussed Advanced Directives, patient agrees to bring [place] copies of documents if can. Encouraged heart healthy diet, exercise as tolerated and adequate sleep. Labs ordered and reviewed. Follows with cardiology, Dr Loleta Chance, they manage her Coumadin. Follows with Dr Elsworth Soho of pulmonology. Follows with [facility]. UTD on mmunizations. Return in 3 months or as needed

## 2014-06-20 ENCOUNTER — Telehealth: Payer: Self-pay | Admitting: Family Medicine

## 2014-06-20 NOTE — Telephone Encounter (Signed)
Called and spoke with Joelene Millin at [facility] regarding med verification.//AB/CMA

## 2014-06-20 NOTE — Telephone Encounter (Signed)
Caller name:Crystal Relation to LR:JPVG Call back number:[phone] Pharmacy:[facility]  Reason for call: [facility] is needing verification for rx HYDROcodone-acetaminophen (NORCO) 10-325 MG per tablet and  temazepam (RESTORIL) 30 MG capsule states the rx was cut so need to know if its ok to fill

## 2014-06-23 ENCOUNTER — Encounter: Payer: Self-pay | Admitting: Family Medicine

## 2014-06-23 DIAGNOSIS — Z0000 Encounter for general adult medical examination without abnormal findings: Secondary | ICD-10-CM | POA: Insufficient documentation

## 2014-06-23 NOTE — Assessment & Plan Note (Signed)
No recent exacerbations.  

## 2014-06-23 NOTE — Assessment & Plan Note (Signed)
Encouraged good sleep hygiene such as dark, quiet room. No blue/green glowing lights such as computer screens in bedroom. No alcohol or stimulants in evening. Cut down on caffeine as able. Regular exercise is helpful but not just prior to bed time. Only effective medicine has been Restoril

## 2014-06-23 NOTE — Assessment & Plan Note (Signed)
Encouraged DASH diet, decrease po intake and increase exercise as tolerated. Needs 7-8 hours of sleep nightly. Avoid trans fats, eat small, frequent meals every 4-5 hours with lean proteins, complex carbs and healthy fats. Minimize simple carbs, 

## 2014-06-23 NOTE — Assessment & Plan Note (Signed)
hgba1c acceptable, minimize simple carbs. Increase exercise as tolerated. Continue current meds 

## 2014-06-23 NOTE — Assessment & Plan Note (Signed)
Uses CPAP routinely 

## 2014-06-23 NOTE — Assessment & Plan Note (Signed)
No recent exacerbations. Has appt with cardiology soon, no change in meds

## 2014-06-23 NOTE — Assessment & Plan Note (Signed)
Tolerating statin, encouraged heart healthy diet, avoid trans fats, minimize simple carbs and saturated fats. Increase exercise as tolerated 

## 2014-06-23 NOTE — Assessment & Plan Note (Signed)
resolved 

## 2014-06-30 NOTE — Assessment & Plan Note (Signed)
RRR today 

## 2014-06-30 NOTE — Assessment & Plan Note (Signed)
Patient denies any difficulties at home. No trouble with ADLs, depression or falls. No recent changes to vision or hearing. Is UTD with immunizations. Is UTD with screening. Discussed Advanced Directives, patient agrees to bring [place] copies of documents if can. Encouraged heart healthy diet, exercise as tolerated and adequate sleep. Labs ordered and reviewed. Follows with cardiology, Dr Loleta Chance, they manage her Coumadin. Follows with Dr Elsworth Soho of pulmonology. Follows with [facility]. UTD on mmunizations. Return in 3 months or as needed

## 2014-07-12 ENCOUNTER — Ambulatory Visit (INDEPENDENT_AMBULATORY_CARE_PROVIDER_SITE_OTHER): Payer: Medicare Other | Admitting: Pharmacist Clinician (PhC)/ Clinical Pharmacy Specialist

## 2014-07-12 ENCOUNTER — Ambulatory Visit (INDEPENDENT_AMBULATORY_CARE_PROVIDER_SITE_OTHER): Payer: Medicare Other | Admitting: Cardiovascular Disease

## 2014-07-12 ENCOUNTER — Encounter: Payer: Self-pay | Admitting: Cardiovascular Disease

## 2014-07-12 VITALS — BP 118/76 | HR 71 | Resp 16 | Ht 70.0 in | Wt 230.9 lb

## 2014-07-12 DIAGNOSIS — Z7901 Long term (current) use of anticoagulants: Secondary | ICD-10-CM

## 2014-07-12 DIAGNOSIS — R0602 Shortness of breath: Secondary | ICD-10-CM | POA: Diagnosis not present

## 2014-07-12 DIAGNOSIS — I10 Essential (primary) hypertension: Secondary | ICD-10-CM

## 2014-07-12 DIAGNOSIS — E669 Obesity, unspecified: Secondary | ICD-10-CM | POA: Diagnosis not present

## 2014-07-12 DIAGNOSIS — G4733 Obstructive sleep apnea (adult) (pediatric): Secondary | ICD-10-CM | POA: Diagnosis not present

## 2014-07-12 DIAGNOSIS — I480 Paroxysmal atrial fibrillation: Secondary | ICD-10-CM

## 2014-07-12 DIAGNOSIS — R0789 Other chest pain: Secondary | ICD-10-CM | POA: Diagnosis not present

## 2014-07-12 DIAGNOSIS — I4891 Unspecified atrial fibrillation: Secondary | ICD-10-CM

## 2014-07-12 DIAGNOSIS — I483 Typical atrial flutter: Secondary | ICD-10-CM | POA: Diagnosis not present

## 2014-07-12 DIAGNOSIS — I351 Nonrheumatic aortic (valve) insufficiency: Secondary | ICD-10-CM | POA: Diagnosis not present

## 2014-07-12 DIAGNOSIS — I5032 Chronic diastolic (congestive) heart failure: Secondary | ICD-10-CM | POA: Diagnosis not present

## 2014-07-12 DIAGNOSIS — J449 Chronic obstructive pulmonary disease, unspecified: Secondary | ICD-10-CM

## 2014-07-12 DIAGNOSIS — G4730 Sleep apnea, unspecified: Secondary | ICD-10-CM | POA: Diagnosis not present

## 2014-07-12 LAB — POCT INR: INR: 2.2

## 2014-07-12 NOTE — Patient Instructions (Signed)
Dr. Croitoru recommends that you schedule a follow-up appointment in: 6 months    

## 2014-07-13 ENCOUNTER — Encounter: Payer: Self-pay | Admitting: Cardiovascular Disease

## 2014-07-13 DIAGNOSIS — G4733 Obstructive sleep apnea (adult) (pediatric): Secondary | ICD-10-CM | POA: Insufficient documentation

## 2014-07-13 NOTE — Progress Notes (Signed)
Patient ID: [name], male   DOB: 01-Jul-1950, 64 y.o.   MRN: [id number]     Reason for office visit Chronic diastolic heart failure, paroxysmal atrial fibrillation, hypertension, obstructive sleep apnea, aortic insufficiency   [name] continues to have his usual functional class II exertional dyspnea and occasional palpitations (usually lasting minutes). He develops throat tightness climbing stairs. He denies edema or any dyspnea at rest. Since there have been no changes in his diuretic regimen recently, he has been spared previous problems with gout attacks.  His echo showed that left ventricular systolic function is slightly depressed with an EF of 45-50%. He has moderate aortic insufficiency which appears to be secondary to mild dilatation of the aortic root. He has a long-standing history of severe hypertension which has not always been well treated. He also has long-standing history of obstructive sleep apnea which was also not treated until recently, but he is now 100% CPAP-compliant. Coronary angiography 2011 did not show evidence of significant stenoses. He presented with typical atrial flutter in 2011 and has recurrent paroxysmal atrial fibrillation with a good response to treatment with dofetilide. Additional problems include obesity, type 2 diabetes mellitus, gout and androgen deficiency.   Allergies  Allergen Reactions  . Sulfonamide Derivatives Other (See Comments)    unknown reaction  . Zolpidem     Excessive, prolonged sedation    Current Outpatient Prescriptions  Medication Sig Dispense Refill  . albuterol (PROVENTIL HFA;VENTOLIN HFA) 108 (90 BASE) MCG/ACT inhaler Inhale 2 puffs into the lungs every 6 (six) hours as needed for wheezing. 3 Inhaler 3  . allopurinol (ZYLOPRIM) 300 MG tablet TAKE ONE TABLET BY MOUTH ONCE DAILY 90 tablet 0  . aspirin EC 81 MG tablet Take 81 mg by mouth daily.    . benazepril (LOTENSIN) 40 MG tablet TAKE ONE TABLET BY MOUTH ONCE DAILY 90  tablet 3  . calcium carbonate (TUMS - DOSED IN MG ELEMENTAL CALCIUM) 500 MG chewable tablet Chew 1 tablet by mouth daily as needed. For heartburn    . COLCRYS 0.6 MG tablet TAKE ONE TABLET BY MOUTH EVERY TWO HOURS FOR FOUR DOSES OR PAIN RELIEF TODAY. THEN TAKE ONE TABLET BY MOUTH ONCE DAILY 35 tablet 0  . fish oil-omega-3 fatty acids 1000 MG capsule Take 1 g by mouth daily.     . furosemide (LASIX) 40 MG tablet TAKE ONE TABLET BY MOUTH ONCE DAILY 30 tablet 7  . glyBURIDE (DIABETA) 5 MG tablet Take 1 tablet (5 mg total) by mouth daily with breakfast. 90 tablet 1  . HYDROcodone-acetaminophen (NORCO) 10-325 MG per tablet Take 1 tablet by mouth every 8 (eight) hours as needed for moderate pain or severe pain. 90 tablet 0  . KLOR-CON M20 20 MEQ tablet TAKE ONE-HALF TO ONE TABLET BY MOUTH ONCE DAILY AS DIRECTED 30 tablet 11  . LORazepam (ATIVAN) 1 MG tablet Take 1 tablet (1 mg total) by mouth 2 (two) times daily as needed for anxiety. 60 tablet 2  . metFORMIN (GLUCOPHAGE) 1000 MG tablet TAKE ONE TABLET BY MOUTH TWICE DAILY WITH MEALS 60 tablet 2  . metoprolol (LOPRESSOR) 100 MG tablet TAKE ONE TABLET BY MOUTH TWICE DAILY 180 tablet 3  . Multiple Vitamins-Minerals (MULTIVITAMIN PO) Take 1 tablet by mouth daily.    . naproxen (NAPROSYN) 375 MG tablet TAKE ONE TABLET BY MOUTH ONCE DAILY AS NEEDED FOR PAIN 30 tablet 4  . potassium chloride SA (KLOR-CON M20) 20 MEQ tablet Take 1 tablet (20 mEq total)  by mouth daily. 30 tablet 5  . pravastatin (PRAVACHOL) 40 MG tablet TAKE ONE TABLET BY MOUTH ONCE DAILY 30 tablet 7  . temazepam (RESTORIL) 30 MG capsule TAKE ONE CAPSULE BY MOUTH AT BEDTIME AS NEEDED FOR  SLEEP  OR  ANXIETY 30 capsule 3  . Testosterone 10 MG/ACT (2%) GEL Use as directed    . TIKOSYN 500 MCG capsule TAKE 1 CAPSULE BY MOUTH EVERY 12 HOURS 60 capsule 10  . warfarin (COUMADIN) 4 MG tablet TAKE ONE TABLET BY MOUTH ONCE DAILY OR AS DIRECTED 90 tablet 1   No current facility-administered medications  for this visit.    Past Medical History  Diagnosis Date  . Hypertension   . Hyperlipidemia   . Atrial fibrillation   . UNSPECIFIED ANEMIA 01/05/2010  . TESTICULAR HYPOFUNCTION 01/05/2010  . PULMONARY FUNCTION TESTS, ABNORMAL 02/02/2010  . PERIPHERAL NEUROPATHY 01/05/2010  . OBESITY NOS 01/25/2007  . NEUROMA 01/05/2010  . INSOMNIA, HX OF 01/25/2007  . HYPERTENSION 01/25/2007  . FATIGUE, CHRONIC 11/11/2009  . ERECTILE DYSFUNCTION 01/25/2007  . DYSPNEA 12/19/2009  . CHF 12/05/2009  . CHEST PAIN-UNSPECIFIED 11/11/2009  . BENIGN PROSTATIC HYPERTROPHY, HX OF 12/05/2009  . Atrial flutter 12/05/2009  . AORTIC STENOSIS, MODERATE 12/05/2009  . Knee pain, right 12/28/2010  . Hypoxia 05/05/2011  . Heart murmur   . SLEEP APNEA 04/06/2010    uses cpap  . DM 12/05/2009  . Superficial thrombophlebitis of left leg 09/07/2011  . Atypical chest pain 11/08/2011  . 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 Hyperkalemia 04/18/2012  . Edema 04/18/2012  . Debility 04/18/2012  . Toe pain, right 07/10/2012  . Gout 07/10/2012  . OSA (obstructive sleep apnea) 07/13/2014    Past Surgical History  Procedure Laterality Date  . Bilat foot surgery removed part of 5th metatarsal to corect curvature of toes    . Cardiac catheterization  10/16/2009    nonischemic cardiomyopathy    Family History  Problem Relation Age of Onset  . Clotting disorder Mother   . Heart disease Mother     s/p MI  . Heart attack Mother   . Hypertension Mother   . Diabetes Mother   . Hyperlipidemia Mother   . Stroke Mother   . Cancer Father     ? lung  . Lung disease Father     smoker  . Diabetes Sister   . Hypertension Sister     smoker  . Leukemia Maternal Grandmother     ?  [name] Aneurysm Sister     brain  . Other Sister     clipped  . Seizures Sister     d/o w/aneurysm/ smoker    History   Social History  . Marital Status: Married    Spouse Name: N/A    Number of Children: N/A  . Years of Education: N/A   Occupational History  . Not on file.    Social History Main Topics  . Smoking status: Former Smoker -- 1.00 packs/day for 10 years    Quit date: 06/07/1980  . Smokeless tobacco: Never Used  . Alcohol Use: No  . Drug Use: No  . Sexual Activity: Not Currently   Other Topics Concern  . Not on file   Social History Narrative    Review of systems: The patient specifically denies any chest pain at rest, dyspnea at rest, orthopnea, paroxysmal nocturnal dyspnea, syncope, palpitations, focal neurological deficits, intermittent claudication, lower extremity edema, unexplained weight gain, cough, hemoptysis or wheezing.  The patient  also denies abdominal pain, nausea, vomiting, dysphagia, diarrhea, constipation, polyuria, polydipsia, dysuria, hematuria, frequency, urgency, abnormal bleeding or bruising, fever, chills, unexpected weight changes, mood swings, change in skin or hair texture, change in voice quality, auditory or visual problems, allergic reactions or rashes, new musculoskeletal complaints other than usual "aches and pains".   PHYSICAL EXAM BP 118/76 mmHg  Pulse 71  Resp 16  Ht 5\' 10"  (1.778 m)  Wt 104.736 kg (230 lb 14.4 oz)  BMI 33.13 kg/m2 General: Alert, oriented x3, no distress  Head: no evidence of trauma, PERRL, EOMI, no exophtalmos or lid lag, no myxedema, no xanthelasma; normal ears, nose and crowded oropharynx  Neck: normal jugular venous pulsations and no hepatojugular reflux; brisk carotid pulses without delay and no carotid bruits  Chest: clear to auscultation, no signs of consolidation by percussion or palpation, normal fremitus, symmetrical and full respiratory excursions  Cardiovascular: normal position and quality of the apical impulse, regular rhythm, normal first and second heart sounds, loud fourth heart sound, grade 1-2/6 decrescendo diastolic murmur at the left lower sternal border  Abdomen: no tenderness or distention, no masses by palpation, no abnormal pulsatility or arterial bruits,  normal bowel sounds, no hepatosplenomegaly  Extremities: no clubbing, cyanosis or edema; 2+ radial, ulnar and brachial pulses bilaterally; 2+ right femoral, posterior tibial and dorsalis pedis pulses; 2+ left femoral, posterior tibial and dorsalis pedis pulses; no subclavian or femoral bruits  Neurological: grossly nonfocal   EKG: Normal sinus rhythm, minor nonspecific intraventricular conduction delay, lateral T wave inversion, QTC 473 ms  Lipid Panel     Component Value Date/Time   CHOL 144 06/13/2014 1149   TRIG 122.0 06/13/2014 1149   HDL 28.30* 06/13/2014 1149   CHOLHDL 5 06/13/2014 1149   VLDL 24.4 06/13/2014 1149   LDLCALC 91 06/13/2014 1149    BMET    Component Value Date/Time   NA 141 06/13/2014 1149   K 4.5 06/13/2014 1149   CL 107 06/13/2014 1149   CO2 26 06/13/2014 1149   GLUCOSE 84 06/13/2014 1149   BUN 23 06/13/2014 1149   CREATININE 1.3 06/13/2014 1149   CREATININE 1.20 10/08/2013 0952   CALCIUM 9.8 06/13/2014 1149   GFRNONAA 66* 08/05/2011 0700   GFRAA 77* 08/05/2011 0700     ASSESSMENT AND PLAN Chronic diastolic congestive heart failure  No recent episode of acute decompensation or need for increased diuretic therapy. He is reminded about the importance of sodium restriction, daily weight monitoring. Might have better exercise tolerance with increased diuretic, but he very afraid of triggering a gout attack.  Atrial fibrillation He has only very brief palpitations and no sustained document atrial fibrillation since we started treatment with Tikosyn. He tolerates AF poorly, probably due to significant diastolic dysfunction. He had convincing evidence of tachycardia-related cardiomyopathy in 2011.  He is at relatively high risk of embolic events and should continue warfarin anticoagulation without interruption.  Reviewed the risk of drug interactions with dofetilide and with warfarin.  Moderate aortic insufficiency  Unlikely a cause for congestive  heart failure. Hypertension is likely the cause of his dilated aortic root, in turn the cause for his aortic valve insufficiency. The most important intervention is careful treatment of his high blood pressure. Repeat echo in 2017.  SLEEP APNEA  He again reports 100% compliance with CPAP therapy. No progress with weight loss. Repeated the recommendation for weight loss and we discussed calorie restriction and physical exercise. He cannot exercise a lot because of severe knee arthritis.  In fact right knee replacement has been recommended.   Orders Placed This Encounter  Procedures  . EKG 12-Lead   No orders of the defined types were placed in this encounter.    Holli Humbles, MD, [place] [phone] office [phone] pager

## 2014-07-30 ENCOUNTER — Other Ambulatory Visit: Payer: Self-pay | Admitting: Family Medicine

## 2014-08-23 ENCOUNTER — Ambulatory Visit (INDEPENDENT_AMBULATORY_CARE_PROVIDER_SITE_OTHER): Payer: Medicare Other | Admitting: Pharmacist Clinician (PhC)/ Clinical Pharmacy Specialist

## 2014-08-23 DIAGNOSIS — I480 Paroxysmal atrial fibrillation: Secondary | ICD-10-CM

## 2014-08-23 DIAGNOSIS — I4891 Unspecified atrial fibrillation: Secondary | ICD-10-CM | POA: Diagnosis not present

## 2014-08-23 DIAGNOSIS — Z7901 Long term (current) use of anticoagulants: Secondary | ICD-10-CM

## 2014-08-23 LAB — POCT INR: INR: 1.9

## 2014-09-13 ENCOUNTER — Other Ambulatory Visit (INDEPENDENT_AMBULATORY_CARE_PROVIDER_SITE_OTHER): Payer: Medicare Other

## 2014-09-13 DIAGNOSIS — R1013 Epigastric pain: Secondary | ICD-10-CM

## 2014-09-13 DIAGNOSIS — E785 Hyperlipidemia, unspecified: Secondary | ICD-10-CM | POA: Diagnosis not present

## 2014-09-13 DIAGNOSIS — I10 Essential (primary) hypertension: Secondary | ICD-10-CM

## 2014-09-13 DIAGNOSIS — M25562 Pain in left knee: Secondary | ICD-10-CM

## 2014-09-13 DIAGNOSIS — E1169 Type 2 diabetes mellitus with other specified complication: Secondary | ICD-10-CM

## 2014-09-13 DIAGNOSIS — E119 Type 2 diabetes mellitus without complications: Secondary | ICD-10-CM

## 2014-09-13 DIAGNOSIS — M109 Gout, unspecified: Secondary | ICD-10-CM

## 2014-09-13 DIAGNOSIS — F411 Generalized anxiety disorder: Secondary | ICD-10-CM

## 2014-09-13 DIAGNOSIS — E669 Obesity, unspecified: Secondary | ICD-10-CM

## 2014-09-13 DIAGNOSIS — M25561 Pain in right knee: Secondary | ICD-10-CM

## 2014-09-13 DIAGNOSIS — G4700 Insomnia, unspecified: Secondary | ICD-10-CM

## 2014-09-13 LAB — CBC
HCT: 41.4 % (ref 39.0–52.0)
HEMOGLOBIN: 13.5 g/dL (ref 13.0–17.0)
MCHC: 32.6 g/dL (ref 30.0–36.0)
MCV: 79.1 fl (ref 78.0–100.0)
PLATELETS: 190 10*3/uL (ref 150.0–400.0)
RBC: 5.23 Mil/uL (ref 4.22–5.81)
RDW: 16.3 % — AB (ref 11.5–15.5)
WBC: 4.4 10*3/uL (ref 4.0–10.5)

## 2014-09-13 LAB — COMPREHENSIVE METABOLIC PANEL
ALT: 21 U/L (ref 0–53)
AST: 17 U/L (ref 0–37)
Albumin: 4 g/dL (ref 3.5–5.2)
Alkaline Phosphatase: 44 U/L (ref 39–117)
BUN: 19 mg/dL (ref 6–23)
CO2: 28 mEq/L (ref 19–32)
CREATININE: 1.16 mg/dL (ref 0.40–1.50)
Calcium: 10.1 mg/dL (ref 8.4–10.5)
Chloride: 106 mEq/L (ref 96–112)
GFR: 82.13 mL/min (ref >60.00)
Glucose, Bld: 136 mg/dL — ABNORMAL HIGH (ref 70–99)
Potassium: 4.9 mEq/L (ref 3.5–5.1)
Sodium: 141 mEq/L (ref 135–145)
Total Bilirubin: 0.8 mg/dL (ref 0.2–1.2)
Total Protein: 7.2 g/dL (ref 6.0–8.3)

## 2014-09-13 LAB — LIPID PANEL
CHOL/HDL RATIO: 4
Cholesterol: 146 mg/dL (ref 0–200)
HDL: 33 mg/dL — AB (ref >39.00)
LDL CALC: 94 mg/dL (ref 0–99)
NONHDL: 113
Triglycerides: 96 mg/dL (ref 0.0–149.0)
VLDL: 19.2 mg/dL (ref 0.0–40.0)

## 2014-09-13 LAB — URIC ACID: URIC ACID, SERUM: 5.2 mg/dL (ref 4.0–7.8)

## 2014-09-13 LAB — TSH: TSH: 1.13 u[IU]/mL (ref 0.35–4.50)

## 2014-09-13 LAB — HEMOGLOBIN A1C: Hgb A1c MFr Bld: 6.8 % — ABNORMAL HIGH (ref 4.6–6.5)

## 2014-09-17 ENCOUNTER — Encounter: Payer: Self-pay | Admitting: Family Medicine

## 2014-09-17 ENCOUNTER — Ambulatory Visit (INDEPENDENT_AMBULATORY_CARE_PROVIDER_SITE_OTHER): Payer: Medicare Other | Admitting: Family Medicine

## 2014-09-17 VITALS — BP 132/80 | HR 84 | Temp 98.5°F | Ht 71.0 in | Wt 230.5 lb

## 2014-09-17 DIAGNOSIS — L309 Dermatitis, unspecified: Secondary | ICD-10-CM

## 2014-09-17 DIAGNOSIS — M25562 Pain in left knee: Secondary | ICD-10-CM

## 2014-09-17 DIAGNOSIS — M25561 Pain in right knee: Secondary | ICD-10-CM | POA: Diagnosis not present

## 2014-09-17 DIAGNOSIS — E669 Obesity, unspecified: Secondary | ICD-10-CM

## 2014-09-17 DIAGNOSIS — E785 Hyperlipidemia, unspecified: Secondary | ICD-10-CM

## 2014-09-17 DIAGNOSIS — E119 Type 2 diabetes mellitus without complications: Secondary | ICD-10-CM

## 2014-09-17 DIAGNOSIS — E1169 Type 2 diabetes mellitus with other specified complication: Secondary | ICD-10-CM

## 2014-09-17 DIAGNOSIS — F411 Generalized anxiety disorder: Secondary | ICD-10-CM

## 2014-09-17 DIAGNOSIS — J449 Chronic obstructive pulmonary disease, unspecified: Secondary | ICD-10-CM

## 2014-09-17 DIAGNOSIS — I10 Essential (primary) hypertension: Secondary | ICD-10-CM

## 2014-09-17 DIAGNOSIS — G4700 Insomnia, unspecified: Secondary | ICD-10-CM | POA: Diagnosis not present

## 2014-09-17 MED ORDER — HYDROCODONE-ACETAMINOPHEN 10-325 MG PO TABS: 1.0000 | ORAL_TABLET | Freq: Three times a day (TID) | ORAL | Status: DC | PRN

## 2014-09-17 MED ORDER — ALLOPURINOL 300 MG PO TABS: 300.0000 mg | ORAL_TABLET | Freq: Every day | ORAL | Status: DC

## 2014-09-17 MED ORDER — MUPIROCIN 2 % EX OINT: 1.0000 "application " | TOPICAL_OINTMENT | Freq: Two times a day (BID) | CUTANEOUS | Status: DC

## 2014-09-17 MED ORDER — LORAZEPAM 1 MG PO TABS: 1.0000 mg | ORAL_TABLET | Freq: Two times a day (BID) | ORAL | Status: DC | PRN

## 2014-09-17 MED ORDER — GLYBURIDE 5 MG PO TABS: ORAL_TABLET | ORAL | Status: DC

## 2014-09-17 MED ORDER — TEMAZEPAM 30 MG PO CAPS: ORAL_CAPSULE | ORAL | Status: DC

## 2014-09-17 MED ORDER — GLYBURIDE 5 MG PO TABS: 5.0000 mg | ORAL_TABLET | Freq: Every day | ORAL | Status: DC

## 2014-09-17 MED ORDER — FLUOXETINE HCL 10 MG PO TABS: 10.0000 mg | ORAL_TABLET | Freq: Every day | ORAL | Status: DC

## 2014-09-17 NOTE — Progress Notes (Signed)
Pre visit review using our clinic review tool, if applicable. No additional management support is needed unless otherwise documented below in the visit note. 

## 2014-09-23 DIAGNOSIS — L308 Other specified dermatitis: Secondary | ICD-10-CM | POA: Diagnosis not present

## 2014-09-23 DIAGNOSIS — R21 Rash and other nonspecific skin eruption: Secondary | ICD-10-CM | POA: Diagnosis not present

## 2014-09-23 DIAGNOSIS — D489 Neoplasm of uncertain behavior, unspecified: Secondary | ICD-10-CM | POA: Diagnosis not present

## 2014-09-23 DIAGNOSIS — L309 Dermatitis, unspecified: Secondary | ICD-10-CM | POA: Insufficient documentation

## 2014-09-23 NOTE — Assessment & Plan Note (Signed)
Well controlled, no changes to meds. Encouraged heart healthy diet such as the DASH diet and exercise as tolerated.  °

## 2014-09-23 NOTE — Assessment & Plan Note (Signed)
Tolerating statin, encouraged heart healthy diet, avoid trans fats, minimize simple carbs and saturated fats. Increase exercise as tolerated 

## 2014-09-23 NOTE — Assessment & Plan Note (Signed)
Left arm and both legs, pruritic and uncomfortable. Unclear etiology, fungal? Psoriasis? Referred to dermatology for further consideration. Will try topical Lotrisone while awaits appt

## 2014-09-23 NOTE — Assessment & Plan Note (Signed)
hgba1c acceptable, minimize simple carbs. Increase exercise as tolerated. Continue current meds 

## 2014-09-23 NOTE — Progress Notes (Signed)
[name]  [id number] 1950-12-20 09/23/2014      Progress Note-Follow Up  Subjective  Chief Complaint  Chief Complaint  Patient presents with  . Follow-up    HPI  Patient is a 64 y.o. male in today for routine medical care. Patient is in today for follow-up. Overall has been feeling well but is difficult to painful pruritic rash on his arms and legs. Initially he thought it was a ringworm and he was applying some fungal treatment but did not have significant response. Has never had similar lesions. Denies any recent illness or fevers. Denies CP/palp/SOB/HA/congestion/fevers/GI or GU c/o. Taking meds as prescribed  Past Medical History  Diagnosis Date  . Hypertension   . Hyperlipidemia   . Atrial fibrillation   . UNSPECIFIED ANEMIA 01/05/2010  . TESTICULAR HYPOFUNCTION 01/05/2010  . PULMONARY FUNCTION TESTS, ABNORMAL 02/02/2010  . PERIPHERAL NEUROPATHY 01/05/2010  . OBESITY NOS 01/25/2007  . NEUROMA 01/05/2010  . INSOMNIA, HX OF 01/25/2007  . HYPERTENSION 01/25/2007  . FATIGUE, CHRONIC 11/11/2009  . ERECTILE DYSFUNCTION 01/25/2007  . DYSPNEA 12/19/2009  . CHF 12/05/2009  . CHEST PAIN-UNSPECIFIED 11/11/2009  . BENIGN PROSTATIC HYPERTROPHY, HX OF 12/05/2009  . Atrial flutter 12/05/2009  . AORTIC STENOSIS, MODERATE 12/05/2009  . Knee pain, right 12/28/2010  . Hypoxia 05/05/2011  . Heart murmur   . SLEEP APNEA 04/06/2010    uses cpap  . DM 12/05/2009  . Superficial thrombophlebitis of left leg 09/07/2011  . Atypical chest pain 11/08/2011  . 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 Hyperkalemia 04/18/2012  . Edema 04/18/2012  . Debility 04/18/2012  . Toe pain, right 07/10/2012  . Gout 07/10/2012  . OSA (obstructive sleep apnea) 07/13/2014    Past Surgical History  Procedure Laterality Date  . Bilat foot surgery removed part of 5th metatarsal to corect curvature of toes    . Cardiac catheterization  10/16/2009    nonischemic cardiomyopathy    Family History  Problem Relation Age of Onset  . Clotting  disorder Mother   . Heart disease Mother     s/p MI  . Heart attack Mother   . Hypertension Mother   . Diabetes Mother   . Hyperlipidemia Mother   . Stroke Mother   . Cancer Father     ? lung  . Lung disease Father     smoker  . Diabetes Sister   . Hypertension Sister     smoker  . Leukemia Maternal Grandmother     ?  [name] Aneurysm Sister     brain  . Other Sister     clipped  . Seizures Sister     d/o w/aneurysm/ smoker    History   Social History  . Marital Status: Married    Spouse Name: N/A  . Number of Children: N/A  . Years of Education: N/A   Occupational History  . Not on file.   Social History Main Topics  . Smoking status: Former Smoker -- 1.00 packs/day for 10 years    Quit date: 06/07/1980  . Smokeless tobacco: Never Used  . Alcohol Use: No  . Drug Use: No  . Sexual Activity: Not Currently   Other Topics Concern  . Not on file   Social History Narrative    Current Outpatient Prescriptions on File Prior to Visit  Medication Sig Dispense Refill  . albuterol (PROVENTIL HFA;VENTOLIN HFA) 108 (90 BASE) MCG/ACT inhaler Inhale 2 puffs into the lungs every 6 (six) hours as needed for wheezing. 3  Inhaler 3  . aspirin EC 81 MG tablet Take 81 mg by mouth daily.    . benazepril (LOTENSIN) 40 MG tablet TAKE ONE TABLET BY MOUTH ONCE DAILY 90 tablet 3  . calcium carbonate (TUMS - DOSED IN MG ELEMENTAL CALCIUM) 500 MG chewable tablet Chew 1 tablet by mouth daily as needed. For heartburn    . COLCRYS 0.6 MG tablet TAKE ONE TABLET BY MOUTH EVERY TWO HOURS FOR FOUR DOSES OR PAIN RELIEF TODAY. THEN TAKE ONE TABLET BY MOUTH ONCE DAILY 35 tablet 0  . fish oil-omega-3 fatty acids 1000 MG capsule Take 1 g by mouth daily.     . furosemide (LASIX) 40 MG tablet TAKE ONE TABLET BY MOUTH ONCE DAILY 30 tablet 7  . metFORMIN (GLUCOPHAGE) 1000 MG tablet TAKE ONE TABLET BY MOUTH TWICE DAILY WITH MEALS 60 tablet 11  . metoprolol (LOPRESSOR) 100 MG tablet TAKE ONE TABLET BY MOUTH  TWICE DAILY 180 tablet 3  . Multiple Vitamins-Minerals (MULTIVITAMIN PO) Take 1 tablet by mouth daily.    . naproxen (NAPROSYN) 375 MG tablet TAKE ONE TABLET BY MOUTH ONCE DAILY AS NEEDED FOR PAIN 30 tablet 4  . potassium chloride SA (KLOR-CON M20) 20 MEQ tablet Take 1 tablet (20 mEq total) by mouth daily. 30 tablet 5  . pravastatin (PRAVACHOL) 40 MG tablet TAKE ONE TABLET BY MOUTH ONCE DAILY 30 tablet 7  . Testosterone 10 MG/ACT (2%) GEL Use as directed    . TIKOSYN 500 MCG capsule TAKE 1 CAPSULE BY MOUTH EVERY 12 HOURS 60 capsule 10  . warfarin (COUMADIN) 4 MG tablet TAKE ONE TABLET BY MOUTH ONCE DAILY OR AS DIRECTED 90 tablet 1   No current facility-administered medications on file prior to visit.    Allergies  Allergen Reactions  . Sulfonamide Derivatives Other (See Comments)    unknown reaction.  Patient states had to go to the ER.   [name] Zolpidem     Excessive, prolonged sedation    Review of Systems  Review of Systems  Constitutional: Positive for malaise/fatigue. Negative for fever.  HENT: Negative for congestion.   Eyes: Negative for discharge.  Respiratory: Positive for cough and shortness of breath.   Cardiovascular: Positive for chest pain. Negative for palpitations and leg swelling.  Gastrointestinal: Negative for nausea, abdominal pain and diarrhea.  Genitourinary: Negative for dysuria.  Musculoskeletal: Negative for falls.  Skin: Positive for itching and rash.  Neurological: Negative for loss of consciousness and headaches.  Endo/Heme/Allergies: Negative for polydipsia.  Psychiatric/Behavioral: Negative for depression and suicidal ideas. The patient is not nervous/anxious and does not have insomnia.     Objective  BP 132/80 mmHg  Pulse 84  Temp(Src) 98.5 F (36.9 C) (Oral)  Ht 5\' 11"  (1.803 m)  Wt 230 lb 8 oz (104.554 kg)  BMI 32.16 kg/m2  SpO2 97%  Physical Exam  Physical Exam  Constitutional: He is oriented to person, place, and time and  well-developed, well-nourished, and in no distress. No distress.  HENT:  Head: Normocephalic and atraumatic.  Eyes: Conjunctivae are normal.  Neck: Neck supple. No thyromegaly present.  Cardiovascular: Normal rate, regular rhythm, normal heart sounds and intact distal pulses.   Pulmonary/Chest: Effort normal and breath sounds normal. No respiratory distress.  Abdominal: He exhibits no distension and no mass. There is no tenderness.  Musculoskeletal: He exhibits no edema.  Neurological: He is alert and oriented to person, place, and time.  Skin: Skin is warm. Rash noted.  Raised erythematous plaques one  on his left arm and similar lesions on b/l lower legs.  Psychiatric: Memory, affect and judgment normal.    Lab Results  Component Value Date   TSH 1.13 09/13/2014   Lab Results  Component Value Date   WBC 4.4 09/13/2014   HGB 13.5 09/13/2014   HCT 41.4 09/13/2014   MCV 79.1 09/13/2014   PLT 190.0 09/13/2014   Lab Results  Component Value Date   CREATININE 1.16 09/13/2014   BUN 19 09/13/2014   NA 141 09/13/2014   K 4.9 09/13/2014   CL 106 09/13/2014   CO2 28 09/13/2014   Lab Results  Component Value Date   ALT 21 09/13/2014   AST 17 09/13/2014   ALKPHOS 44 09/13/2014   BILITOT 0.8 09/13/2014   Lab Results  Component Value Date   CHOL 146 09/13/2014   Lab Results  Component Value Date   HDL 33.00* 09/13/2014   Lab Results  Component Value Date   LDLCALC 94 09/13/2014   Lab Results  Component Value Date   TRIG 96.0 09/13/2014   Lab Results  Component Value Date   CHOLHDL 4 09/13/2014     Assessment & Plan  Essential hypertension Well controlled, no changes to meds. Encouraged heart healthy diet such as the DASH diet and exercise as tolerated.    COPD (chronic obstructive pulmonary disease) No recent exacerbations   Diabetes mellitus type 2 in obese hgba1c acceptable, minimize simple carbs. Increase exercise as tolerated. Continue current  meds   Dermatitis Left arm and both legs, pruritic and uncomfortable. Unclear etiology, fungal? Psoriasis? Referred to dermatology for further consideration. Will try topical Lotrisone while awaits appt   Hyperlipidemia Tolerating statin, encouraged heart healthy diet, avoid trans fats, minimize simple carbs and saturated fats. Increase exercise as tolerated

## 2014-09-23 NOTE — Assessment & Plan Note (Signed)
No recent exacerbations.  

## 2014-10-04 ENCOUNTER — Ambulatory Visit (INDEPENDENT_AMBULATORY_CARE_PROVIDER_SITE_OTHER): Payer: Medicare Other | Admitting: Pharmacist Clinician (PhC)/ Clinical Pharmacy Specialist

## 2014-10-04 ENCOUNTER — Other Ambulatory Visit: Payer: Self-pay | Admitting: Pharmacist Clinician (PhC)/ Clinical Pharmacy Specialist

## 2014-10-04 DIAGNOSIS — Z7901 Long term (current) use of anticoagulants: Secondary | ICD-10-CM | POA: Diagnosis not present

## 2014-10-04 DIAGNOSIS — I4891 Unspecified atrial fibrillation: Secondary | ICD-10-CM | POA: Diagnosis not present

## 2014-10-04 LAB — POCT INR: INR: 1.9

## 2014-10-04 MED ORDER — WARFARIN SODIUM 4 MG PO TABS: ORAL_TABLET | ORAL | Status: DC

## 2014-10-22 ENCOUNTER — Telehealth: Payer: Self-pay | Admitting: *Deleted

## 2014-10-22 NOTE — Telephone Encounter (Signed)
Mr. Lucarelli never started the Fluoxetine due to possible med allergy with several skin lesions on his legs.  Will discuss at his next office visit with Dr. Randel Pigg 5/27.  He is aware he if he does start this drug he needs an EKG 5 days after starting.  He will keep [place] posted.

## 2014-10-22 NOTE — Telephone Encounter (Signed)
-----   Message from Sanda Klein, MD sent at 10/22/2014  1:40 PM EDT ----- Regarding: ECG Can you please find out if he ever had the follow up ECG? If not, have him come in to our office for it. MCr ----- Message -----    From: [name], MD    Sent: 09/19/2014   8:44 PM      To: Sanda Klein, MD  Great  I will start the Fluoxetine and then the EKG 5 days later. Will forward you a copy of the EKG through Epic  Thanks Penni Homans, MD ----- Message -----    From: Sanda Klein, MD    Sent: 09/19/2014   8:14 AM      To: [name], MD  I think that would be fine. I would like to get an ecg after about 5 days on the fluoxetine. If you could please tell me the start date, he can have it in our office. Or if you can do it there , just let me know to look it up in EPIC. Sanda Klein, MD ----- Message -----    From: [name], MD    Sent: 09/17/2014  10:37 AM      To: Sanda Klein, MD  Good morning  I am seeing [name] and we are discussing his anxiety. We are considering stating Fluoxetine but wanted to confirm you felt this would be acceptable with the Tikosyn. I initially tried to rx Citalopram but was given a warning regarding QT prolongation. Was not given this warning regarding Fluoxetine. Would appreciate your thoughts or if you have any concerns.  Thanks  [name], MD

## 2014-10-25 ENCOUNTER — Ambulatory Visit (INDEPENDENT_AMBULATORY_CARE_PROVIDER_SITE_OTHER): Payer: Medicare Other | Admitting: Pharmacist Clinician (PhC)/ Clinical Pharmacy Specialist

## 2014-10-25 DIAGNOSIS — Z7901 Long term (current) use of anticoagulants: Secondary | ICD-10-CM

## 2014-10-25 DIAGNOSIS — I4891 Unspecified atrial fibrillation: Secondary | ICD-10-CM | POA: Diagnosis not present

## 2014-10-25 LAB — POCT INR: INR: 2.5

## 2014-11-01 ENCOUNTER — Ambulatory Visit (INDEPENDENT_AMBULATORY_CARE_PROVIDER_SITE_OTHER): Payer: Medicare Other | Admitting: Family Medicine

## 2014-11-01 ENCOUNTER — Telehealth: Payer: Self-pay | Admitting: Family Medicine

## 2014-11-01 ENCOUNTER — Encounter: Payer: Self-pay | Admitting: Family Medicine

## 2014-11-01 VITALS — BP 122/70 | HR 86 | Temp 97.9°F | Ht 71.0 in | Wt 231.1 lb

## 2014-11-01 DIAGNOSIS — Z8639 Personal history of other endocrine, nutritional and metabolic disease: Secondary | ICD-10-CM

## 2014-11-01 DIAGNOSIS — E119 Type 2 diabetes mellitus without complications: Secondary | ICD-10-CM

## 2014-11-01 DIAGNOSIS — I10 Essential (primary) hypertension: Secondary | ICD-10-CM

## 2014-11-01 DIAGNOSIS — Z8739 Personal history of other diseases of the musculoskeletal system and connective tissue: Secondary | ICD-10-CM

## 2014-11-01 DIAGNOSIS — E785 Hyperlipidemia, unspecified: Secondary | ICD-10-CM

## 2014-11-01 DIAGNOSIS — E669 Obesity, unspecified: Secondary | ICD-10-CM

## 2014-11-01 DIAGNOSIS — M109 Gout, unspecified: Secondary | ICD-10-CM

## 2014-11-01 DIAGNOSIS — D649 Anemia, unspecified: Secondary | ICD-10-CM

## 2014-11-01 DIAGNOSIS — I480 Paroxysmal atrial fibrillation: Secondary | ICD-10-CM

## 2014-11-01 DIAGNOSIS — E1169 Type 2 diabetes mellitus with other specified complication: Secondary | ICD-10-CM

## 2014-11-01 MED ORDER — CETIRIZINE HCL 10 MG PO TABS: 10.0000 mg | ORAL_TABLET | Freq: Every day | ORAL | Status: AC

## 2014-11-01 MED ORDER — CLOBETASOL PROP EMOLLIENT BASE 0.05 % EX CREA: 1.0000 "application " | TOPICAL_CREAM | Freq: Two times a day (BID) | CUTANEOUS | Status: DC

## 2014-11-01 NOTE — Patient Instructions (Signed)
Apply witch hazel and keep wounds as dry as possible. Discontinue the use of peroxide on areas. If the area doesn't dry within a 1 week call for a consult to Wound Management.

## 2014-11-01 NOTE — Progress Notes (Signed)
Pre visit review using our clinic review tool, if applicable. No additional management support is needed unless otherwise documented below in the visit note. 

## 2014-11-01 NOTE — Telephone Encounter (Signed)
Lab orders entered at [facility].  Patient informed

## 2014-11-01 NOTE — Telephone Encounter (Signed)
Pt requesting to have lab work done at [facility] requesting orders for the month of July.

## 2014-11-06 DIAGNOSIS — E291 Testicular hypofunction: Secondary | ICD-10-CM | POA: Diagnosis not present

## 2014-11-06 DIAGNOSIS — R972 Elevated prostate specific antigen [PSA]: Secondary | ICD-10-CM | POA: Diagnosis not present

## 2014-11-07 ENCOUNTER — Other Ambulatory Visit: Payer: Self-pay | Admitting: Pulmonary Disease

## 2014-11-07 DIAGNOSIS — G4733 Obstructive sleep apnea (adult) (pediatric): Secondary | ICD-10-CM

## 2014-11-10 ENCOUNTER — Encounter: Payer: Self-pay | Admitting: Family Medicine

## 2014-11-10 NOTE — Assessment & Plan Note (Signed)
No flares recently, maintain current meds and hydration

## 2014-11-10 NOTE — Assessment & Plan Note (Signed)
Doing well on Tikosyn, no changes

## 2014-11-10 NOTE — Assessment & Plan Note (Signed)
Well controlled, no changes to meds. Encouraged heart healthy diet such as the DASH diet and exercise as tolerated.  °

## 2014-11-10 NOTE — Progress Notes (Signed)
[name]  [id number] Nov 21, 1950 11/10/2014      Progress Note-Follow Up  Subjective  Chief Complaint  Chief Complaint  Patient presents with  . Follow-up    HPI  Patient is a 64 y.o. male in today for routine medical care. Patient notes that he has been doing well. The sores on his legs are improving after treatment from dermatology pathology revealed original source was likely a spider bite.. He has struggled with some mild cold intolerance headache and clear rhinorrhea the last few days. Has ongoing shortness of breath but it is not notably flared. No other recent illness. Denies CP/palp/HA/congestion/fevers/GI or GU c/o. Taking meds as prescribed.  Past Medical History  Diagnosis Date  . Hypertension   . Hyperlipidemia   . Atrial fibrillation   . UNSPECIFIED ANEMIA 01/05/2010  . TESTICULAR HYPOFUNCTION 01/05/2010  . PULMONARY FUNCTION TESTS, ABNORMAL 02/02/2010  . PERIPHERAL NEUROPATHY 01/05/2010  . OBESITY NOS 01/25/2007  . NEUROMA 01/05/2010  . INSOMNIA, HX OF 01/25/2007  . HYPERTENSION 01/25/2007  . FATIGUE, CHRONIC 11/11/2009  . ERECTILE DYSFUNCTION 01/25/2007  . DYSPNEA 12/19/2009  . CHF 12/05/2009  . CHEST PAIN-UNSPECIFIED 11/11/2009  . BENIGN PROSTATIC HYPERTROPHY, HX OF 12/05/2009  . Atrial flutter 12/05/2009  . AORTIC STENOSIS, MODERATE 12/05/2009  . Knee pain, right 12/28/2010  . Hypoxia 05/05/2011  . Heart murmur   . SLEEP APNEA 04/06/2010    uses cpap  . DM 12/05/2009  . Superficial thrombophlebitis of left leg 09/07/2011  . Atypical chest pain 11/08/2011  . 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 Hyperkalemia 04/18/2012  . Edema 04/18/2012  . Debility 04/18/2012  . Toe pain, right 07/10/2012  . Gout 07/10/2012  . OSA (obstructive sleep apnea) 07/13/2014    Past Surgical History  Procedure Laterality Date  . Bilat foot surgery removed part of 5th metatarsal to corect curvature of toes    . Cardiac catheterization  10/16/2009    nonischemic cardiomyopathy    Family History  Problem  Relation Age of Onset  . Clotting disorder Mother   . Heart disease Mother     s/p MI  . Heart attack Mother   . Hypertension Mother   . Diabetes Mother   . Hyperlipidemia Mother   . Stroke Mother   . Cancer Father     ? lung  . Lung disease Father     smoker  . Diabetes Sister   . Hypertension Sister     smoker  . Leukemia Maternal Grandmother     ?  [name] Aneurysm Sister     brain  . Other Sister     clipped  . Seizures Sister     d/o w/aneurysm/ smoker    History   Social History  . Marital Status: Married    Spouse Name: N/A  . Number of Children: N/A  . Years of Education: N/A   Occupational History  . Not on file.   Social History Main Topics  . Smoking status: Former Smoker -- 1.00 packs/day for 10 years    Quit date: 06/07/1980  . Smokeless tobacco: Never Used  . Alcohol Use: No  . Drug Use: No  . Sexual Activity: Not Currently   Other Topics Concern  . Not on file   Social History Narrative    Current Outpatient Prescriptions on File Prior to Visit  Medication Sig Dispense Refill  . albuterol (PROVENTIL HFA;VENTOLIN HFA) 108 (90 BASE) MCG/ACT inhaler Inhale 2 puffs into the lungs every 6 (six) hours  as needed for wheezing. 3 Inhaler 3  . allopurinol (ZYLOPRIM) 300 MG tablet Take 1 tablet (300 mg total) by mouth daily. 90 tablet 2  . aspirin EC 81 MG tablet Take 81 mg by mouth daily.    . benazepril (LOTENSIN) 40 MG tablet TAKE ONE TABLET BY MOUTH ONCE DAILY 90 tablet 3  . calcium carbonate (TUMS - DOSED IN MG ELEMENTAL CALCIUM) 500 MG chewable tablet Chew 1 tablet by mouth daily as needed. For heartburn    . COLCRYS 0.6 MG tablet TAKE ONE TABLET BY MOUTH EVERY TWO HOURS FOR FOUR DOSES OR PAIN RELIEF TODAY. THEN TAKE ONE TABLET BY MOUTH ONCE DAILY 35 tablet 0  . fish oil-omega-3 fatty acids 1000 MG capsule Take 1 g by mouth daily.     [name] FLUoxetine (PROZAC) 10 MG tablet Take 1 tablet (10 mg total) by mouth daily. 30 tablet 0  . furosemide (LASIX) 40 MG  tablet TAKE ONE TABLET BY MOUTH ONCE DAILY 30 tablet 7  . glyBURIDE (DIABETA) 5 MG tablet Take 1 tablet (5 mg total) by mouth daily with breakfast. 90 tablet 3  . HYDROcodone-acetaminophen (NORCO) 10-325 MG per tablet Take 1 tablet by mouth every 8 (eight) hours as needed for moderate pain or severe pain. 90 tablet 0  . LORazepam (ATIVAN) 1 MG tablet Take 1 tablet (1 mg total) by mouth 2 (two) times daily as needed for anxiety. 60 tablet 2  . metFORMIN (GLUCOPHAGE) 1000 MG tablet TAKE ONE TABLET BY MOUTH TWICE DAILY WITH MEALS 60 tablet 11  . metoprolol (LOPRESSOR) 100 MG tablet TAKE ONE TABLET BY MOUTH TWICE DAILY 180 tablet 3  . Multiple Vitamins-Minerals (MULTIVITAMIN PO) Take 1 tablet by mouth daily.    . mupirocin ointment (BACTROBAN) 2 % Place 1 application into the nose 2 (two) times daily. 30 g 1  . naproxen (NAPROSYN) 375 MG tablet TAKE ONE TABLET BY MOUTH ONCE DAILY AS NEEDED FOR PAIN 30 tablet 4  . potassium chloride SA (KLOR-CON M20) 20 MEQ tablet Take 1 tablet (20 mEq total) by mouth daily. 30 tablet 5  . pravastatin (PRAVACHOL) 40 MG tablet TAKE ONE TABLET BY MOUTH ONCE DAILY 30 tablet 7  . temazepam (RESTORIL) 30 MG capsule TAKE ONE CAPSULE BY MOUTH AT BEDTIME AS NEEDED FOR  SLEEP  OR  ANXIETY 30 capsule 3  . Testosterone 10 MG/ACT (2%) GEL Use as directed    . TIKOSYN 500 MCG capsule TAKE 1 CAPSULE BY MOUTH EVERY 12 HOURS 60 capsule 10  . warfarin (COUMADIN) 4 MG tablet TAKE ONE TABLET BY MOUTH ONCE DAILY OR AS DIRECTED 90 tablet 1   No current facility-administered medications on file prior to visit.    Allergies  Allergen Reactions  . Sulfonamide Derivatives Other (See Comments)    unknown reaction.  Patient states had to go to the ER.   [name] Zolpidem     Excessive, prolonged sedation    Review of Systems  Review of Systems  Constitutional: Negative for fever and malaise/fatigue.  HENT: Positive for congestion.   Eyes: Negative for discharge.  Respiratory: Positive for  shortness of breath.   Cardiovascular: Negative for chest pain, palpitations and leg swelling.  Gastrointestinal: Negative for nausea, abdominal pain and diarrhea.  Genitourinary: Negative for dysuria.  Musculoskeletal: Negative for falls.  Skin: Positive for rash.  Neurological: Negative for loss of consciousness and headaches.  Endo/Heme/Allergies: Negative for polydipsia.  Psychiatric/Behavioral: Negative for depression and suicidal ideas. The patient is not nervous/anxious  and does not have insomnia.     Objective  BP 122/70 mmHg  Pulse 86  Temp(Src) 97.9 F (36.6 C) (Oral)  Ht 5\' 11"  (1.803 m)  Wt 231 lb 2 oz (104.838 kg)  BMI 32.25 kg/m2  SpO2 99%  Physical Exam  Physical Exam  Constitutional: He is oriented to person, place, and time and well-developed, well-nourished, and in no distress. No distress.  HENT:  Head: Normocephalic and atraumatic.  Eyes: Conjunctivae are normal.  Neck: Neck supple. No thyromegaly present.  Cardiovascular: Normal rate, regular rhythm and normal heart sounds.   No murmur heard. Pulmonary/Chest: Effort normal and breath sounds normal. No respiratory distress.  Abdominal: He exhibits no distension and no mass. There is no tenderness.  Musculoskeletal: He exhibits no edema.  Neurological: He is alert and oriented to person, place, and time.  Skin: Skin is warm. Rash noted.  Plaques noted on leg  Psychiatric: Memory, affect and judgment normal.    Lab Results  Component Value Date   TSH 1.13 09/13/2014   Lab Results  Component Value Date   WBC 4.4 09/13/2014   HGB 13.5 09/13/2014   HCT 41.4 09/13/2014   MCV 79.1 09/13/2014   PLT 190.0 09/13/2014   Lab Results  Component Value Date   CREATININE 1.16 09/13/2014   BUN 19 09/13/2014   NA 141 09/13/2014   K 4.9 09/13/2014   CL 106 09/13/2014   CO2 28 09/13/2014   Lab Results  Component Value Date   ALT 21 09/13/2014   AST 17 09/13/2014   ALKPHOS 44 09/13/2014   BILITOT 0.8  09/13/2014   Lab Results  Component Value Date   CHOL 146 09/13/2014   Lab Results  Component Value Date   HDL 33.00* 09/13/2014   Lab Results  Component Value Date   LDLCALC 94 09/13/2014   Lab Results  Component Value Date   TRIG 96.0 09/13/2014   Lab Results  Component Value Date   CHOLHDL 4 09/13/2014     Assessment & Plan  Atrial fibrillation, converted to SR after 2nd dose of Tikosyn. Doing well on Tikosyn, no changes   Essential hypertension Well controlled, no changes to meds. Encouraged heart healthy diet such as the DASH diet and exercise as tolerated.    Diabetes mellitus type 2 in obese hgba1c acceptable, minimize simple carbs. Increase exercise as tolerated. Continue current meds   Anemia resolved   Gout No flares recently, maintain current meds and hydration

## 2014-11-10 NOTE — Assessment & Plan Note (Signed)
resolved 

## 2014-11-10 NOTE — Assessment & Plan Note (Signed)
hgba1c acceptable, minimize simple carbs. Increase exercise as tolerated. Continue current meds 

## 2014-11-13 DIAGNOSIS — E291 Testicular hypofunction: Secondary | ICD-10-CM | POA: Diagnosis not present

## 2014-11-13 DIAGNOSIS — R972 Elevated prostate specific antigen [PSA]: Secondary | ICD-10-CM | POA: Diagnosis not present

## 2014-11-13 DIAGNOSIS — R6882 Decreased libido: Secondary | ICD-10-CM | POA: Diagnosis not present

## 2014-11-13 DIAGNOSIS — N5201 Erectile dysfunction due to arterial insufficiency: Secondary | ICD-10-CM | POA: Diagnosis not present

## 2014-11-26 ENCOUNTER — Ambulatory Visit (INDEPENDENT_AMBULATORY_CARE_PROVIDER_SITE_OTHER): Payer: Medicare Other | Admitting: Pulmonary Disease

## 2014-11-26 ENCOUNTER — Encounter: Payer: Self-pay | Admitting: Pulmonary Disease

## 2014-11-26 VITALS — BP 128/84 | HR 65 | Ht 71.0 in | Wt 230.2 lb

## 2014-11-26 DIAGNOSIS — G4733 Obstructive sleep apnea (adult) (pediatric): Secondary | ICD-10-CM

## 2014-11-26 NOTE — Patient Instructions (Signed)
CPAP download will be checked & supplies will be renewed x 1 year Referral to behaviour therapist for sleep

## 2014-11-26 NOTE — Progress Notes (Signed)
   Subjective:    Patient ID: [name], male    DOB: [birth year], 64 y.o.   MRN: [id number]  HPI  PCP - Randel Pigg  Cards >> Croitoru  EP- Lovena Le   63/M for FU of insomnia & severe obstructive sleep apnea  He has atrial fibrillation & non ischemic cardiomyopathy with EF 40% ( ? Tachycardia induced)  He has longstanding insomnia, witnessed apneas, loud snoring, excessive daytime fatigue, and non-refreshing and restless sleep.  He worked 3 rd shift x 6 yrs, last worked 3-10p      11/26/2014 Chief Complaint  Patient presents with  . Follow-up    Wears CPAP nightly - no complaints. Denies any issues with pressure/mask.    Continues to struggle with insomnia  -  Temazepam provides him up to 5 hours  For  anxiety - on lorazepam bid Ambien caused a hangover effect in the past If he does not sleep well, will have a bad day following Compliant with cpap wears CPAP everynight x 5 hrs a night. Pt states he does not feel rested all the time.  has good and bad days with his breathing.  Significant tests/ events  PSG dec '11>> - weighed 225 pounds,BMI of 31, neck size of 14 inches Sleep was very fragmented due to frequent arousals and awakenings, especially in the second half of the night with CPAP. He was desensitized with a standard nasal mask. Note that he took 2 tablets of temazepam of 15 mg prior to the study. >>AHI was 42/h with lowest desatn of 86 % >>corrected by CPAP of 8 cm with a nasal mask. Download 12/28 -07/02/10 >> good compliance avg 6.5h, AHI 15/h, centrals 9/h, obstructive 4.5/h, avg pr 8 cm  Download on 10 cm 2/28-3/28/12 >> few residual events, some central apneas persist   PFT -> 01/2010 - TLC 77%, DLCO 56%, FVC 3.5L/72%, RAtio 77 ,FEV 1 78%   Review of Systems neg for any significant sore throat, dysphagia, itching, sneezing, nasal congestion or excess/ purulent secretions, fever, chills, sweats, unintended wt loss, pleuritic or exertional cp, hempoptysis, orthopnea pnd  or change in chronic leg swelling. Also denies presyncope, palpitations, heartburn, abdominal pain, nausea, vomiting, diarrhea or change in bowel or urinary habits, dysuria,hematuria, rash, arthralgias, visual complaints, headache, numbness weakness or ataxia.     Objective:   Physical Exam  Gen. Pleasant, well-nourished, in no distress ENT - no lesions, no post nasal drip Neck: No JVD, no thyromegaly, no carotid bruits Lungs: no use of accessory muscles, no dullness to percussion, clear without rales or rhonchi  Cardiovascular: Rhythm regular, heart sounds  normal, no murmurs or gallops, no peripheral edema Musculoskeletal: No deformities, no cyanosis or clubbing        Assessment & Plan:

## 2014-11-27 ENCOUNTER — Ambulatory Visit (INDEPENDENT_AMBULATORY_CARE_PROVIDER_SITE_OTHER): Payer: Medicare Other | Admitting: Pharmacist

## 2014-11-27 DIAGNOSIS — Z7901 Long term (current) use of anticoagulants: Secondary | ICD-10-CM | POA: Diagnosis not present

## 2014-11-27 DIAGNOSIS — I4891 Unspecified atrial fibrillation: Secondary | ICD-10-CM | POA: Diagnosis not present

## 2014-11-27 LAB — POCT INR: INR: 2.6

## 2014-11-27 NOTE — Assessment & Plan Note (Signed)
We'll obtain CPAP download to ensure correct settings CPAP supplies will be renewed for a year Weight loss encouraged, compliance with goal of at least 4-6 hrs every night is the expectation. Advised against medications with sedative side effects Cautioned against driving when sleepy - understanding that sleepiness will vary on a day to day basis

## 2014-11-27 NOTE — Assessment & Plan Note (Signed)
For his insomnia we will refer him to cognitive behavioral therapist to learn sleep consolidation techniques This is a long-standing problem however

## 2014-12-06 ENCOUNTER — Telehealth: Payer: Self-pay | Admitting: Pulmonary Disease

## 2014-12-06 NOTE — Telephone Encounter (Signed)
Pt requesting results from his cpap download after his 6/21 appt.    Dr. Karmen Stabs has this been received?  Thanks!

## 2014-12-06 NOTE — Telephone Encounter (Signed)
Do not have download Message sent to [facility] to obtain  Called patient and advised him that I am sending message to [facility] to obtain download  Advised patient that will call him back as soon as we have obtained the download  Will hold in my box until completed.

## 2014-12-10 NOTE — Telephone Encounter (Signed)
Another message sent to [facility] to request download. Will hold in my box until complete.

## 2014-12-11 DIAGNOSIS — E291 Testicular hypofunction: Secondary | ICD-10-CM | POA: Diagnosis not present

## 2014-12-12 NOTE — Telephone Encounter (Signed)
Per Melissa:  Pt bringing card to local [facility] to have downloaded tomorrow Will hold in my box until complete.

## 2014-12-16 NOTE — Telephone Encounter (Signed)
Download received, in Dr. Bari Mantis look at folder.

## 2014-12-16 NOTE — Telephone Encounter (Signed)
Did you ever receive download Sharyn Lull? thanks

## 2014-12-16 NOTE — Telephone Encounter (Signed)
No, another message was sent to [facility] asking if pt ever brought card by to get downloaded. Awaiting message back from [place]. Hold in my box until completed.

## 2014-12-19 NOTE — Telephone Encounter (Signed)
Patient states he is returning "Laura's" call from this morning.  He can be reached at [phone].

## 2014-12-19 NOTE — Telephone Encounter (Signed)
I spoke with patient about results and he verbalized understanding and had no questions 

## 2014-12-19 NOTE — Telephone Encounter (Signed)
65m Download 10/2014 >> CPAP 10 cm Good usage No residuals, no sig leak No changes

## 2014-12-19 NOTE — Telephone Encounter (Signed)
lmomtcb x1 

## 2014-12-25 ENCOUNTER — Ambulatory Visit (INDEPENDENT_AMBULATORY_CARE_PROVIDER_SITE_OTHER): Payer: Medicare Other | Admitting: Pharmacist Clinician (PhC)/ Clinical Pharmacy Specialist

## 2014-12-25 DIAGNOSIS — I4891 Unspecified atrial fibrillation: Secondary | ICD-10-CM | POA: Diagnosis not present

## 2014-12-25 DIAGNOSIS — Z7901 Long term (current) use of anticoagulants: Secondary | ICD-10-CM | POA: Diagnosis not present

## 2014-12-25 LAB — POCT INR: INR: 2.8

## 2014-12-26 ENCOUNTER — Encounter: Payer: Self-pay | Admitting: Pulmonary Disease

## 2014-12-27 ENCOUNTER — Ambulatory Visit: Payer: Medicare Other | Admitting: Family Medicine

## 2015-01-07 ENCOUNTER — Other Ambulatory Visit (INDEPENDENT_AMBULATORY_CARE_PROVIDER_SITE_OTHER): Payer: Medicare Other

## 2015-01-07 DIAGNOSIS — I10 Essential (primary) hypertension: Secondary | ICD-10-CM | POA: Diagnosis not present

## 2015-01-07 DIAGNOSIS — E1169 Type 2 diabetes mellitus with other specified complication: Secondary | ICD-10-CM

## 2015-01-07 DIAGNOSIS — E119 Type 2 diabetes mellitus without complications: Secondary | ICD-10-CM | POA: Diagnosis not present

## 2015-01-07 DIAGNOSIS — E669 Obesity, unspecified: Secondary | ICD-10-CM | POA: Diagnosis not present

## 2015-01-07 DIAGNOSIS — Z8639 Personal history of other endocrine, nutritional and metabolic disease: Secondary | ICD-10-CM | POA: Diagnosis not present

## 2015-01-07 DIAGNOSIS — Z8739 Personal history of other diseases of the musculoskeletal system and connective tissue: Secondary | ICD-10-CM

## 2015-01-07 DIAGNOSIS — E785 Hyperlipidemia, unspecified: Secondary | ICD-10-CM | POA: Diagnosis not present

## 2015-01-07 LAB — LIPID PANEL
Cholesterol: 136 mg/dL (ref 0–200)
HDL: 28.9 mg/dL — AB (ref >39.00)
LDL Cholesterol: 88 mg/dL (ref 0–99)
NonHDL: 107.2
Total CHOL/HDL Ratio: 5
Triglycerides: 98 mg/dL (ref 0.0–149.0)
VLDL: 19.6 mg/dL (ref 0.0–40.0)

## 2015-01-07 LAB — CBC
HCT: 44 % (ref 39.0–52.0)
Hemoglobin: 14.4 g/dL (ref 13.0–17.0)
MCHC: 32.7 g/dL (ref 30.0–36.0)
MCV: 79.4 fl (ref 78.0–100.0)
Platelets: 224 10*3/uL (ref 150.0–400.0)
RBC: 5.53 Mil/uL (ref 4.22–5.81)
RDW: 16.4 % — AB (ref 11.5–15.5)
WBC: 4.4 10*3/uL (ref 4.0–10.5)

## 2015-01-07 LAB — COMPREHENSIVE METABOLIC PANEL
ALK PHOS: 39 U/L (ref 39–117)
ALT: 11 U/L (ref 0–53)
AST: 15 U/L (ref 0–37)
Albumin: 4.1 g/dL (ref 3.5–5.2)
BUN: 18 mg/dL (ref 6–23)
CALCIUM: 9.8 mg/dL (ref 8.4–10.5)
CO2: 29 mEq/L (ref 19–32)
CREATININE: 1.22 mg/dL (ref 0.40–1.50)
Chloride: 107 mEq/L (ref 96–112)
GFR: 77.41 mL/min (ref >60.00)
Glucose, Bld: 107 mg/dL — ABNORMAL HIGH (ref 70–99)
POTASSIUM: 4.8 meq/L (ref 3.5–5.1)
SODIUM: 142 meq/L (ref 135–145)
TOTAL PROTEIN: 7.1 g/dL (ref 6.0–8.3)
Total Bilirubin: 0.5 mg/dL (ref 0.2–1.2)

## 2015-01-07 LAB — TSH: TSH: 1.04 u[IU]/mL (ref 0.35–4.50)

## 2015-01-07 LAB — URIC ACID: Uric Acid, Serum: 6 mg/dL (ref 4.0–7.8)

## 2015-01-07 LAB — HEMOGLOBIN A1C: HEMOGLOBIN A1C: 6.1 % (ref 4.6–6.5)

## 2015-01-10 ENCOUNTER — Ambulatory Visit (INDEPENDENT_AMBULATORY_CARE_PROVIDER_SITE_OTHER): Payer: Medicare Other | Admitting: Family Medicine

## 2015-01-10 ENCOUNTER — Encounter: Payer: Self-pay | Admitting: Family Medicine

## 2015-01-10 VITALS — BP 122/82 | HR 65 | Temp 98.3°F | Ht 71.0 in | Wt 227.1 lb

## 2015-01-10 DIAGNOSIS — R0902 Hypoxemia: Secondary | ICD-10-CM | POA: Diagnosis not present

## 2015-01-10 DIAGNOSIS — E119 Type 2 diabetes mellitus without complications: Secondary | ICD-10-CM

## 2015-01-10 DIAGNOSIS — I5032 Chronic diastolic (congestive) heart failure: Secondary | ICD-10-CM

## 2015-01-10 DIAGNOSIS — L819 Disorder of pigmentation, unspecified: Secondary | ICD-10-CM | POA: Insufficient documentation

## 2015-01-10 DIAGNOSIS — E669 Obesity, unspecified: Secondary | ICD-10-CM

## 2015-01-10 DIAGNOSIS — E1169 Type 2 diabetes mellitus with other specified complication: Secondary | ICD-10-CM

## 2015-01-10 DIAGNOSIS — G4733 Obstructive sleep apnea (adult) (pediatric): Secondary | ICD-10-CM

## 2015-01-10 DIAGNOSIS — I10 Essential (primary) hypertension: Secondary | ICD-10-CM | POA: Diagnosis not present

## 2015-01-10 DIAGNOSIS — E785 Hyperlipidemia, unspecified: Secondary | ICD-10-CM

## 2015-01-10 MED ORDER — CLOBETASOL PROP EMOLLIENT BASE 0.05 % EX CREA: 1.0000 "application " | TOPICAL_CREAM | Freq: Two times a day (BID) | CUTANEOUS | Status: DC

## 2015-01-10 NOTE — Assessment & Plan Note (Signed)
Well controlled, no changes to meds. Encouraged heart healthy diet such as the DASH diet and exercise as tolerated.  °

## 2015-01-10 NOTE — Assessment & Plan Note (Signed)
Tolerating statin, encouraged heart healthy diet, avoid trans fats, minimize simple carbs and saturated fats. Increase exercise as tolerated 

## 2015-01-10 NOTE — Patient Instructions (Signed)
Consider Elderberry extract for respiratory infections, aged garlic, zinc help to fight these as well Probiotics daily such as Digestive Advanatage or [place] or [place] 10 strain cap 1 cap daily at [email or web address] Basic Carbohydrate Counting for Diabetes Mellitus Carbohydrate counting is a method for keeping track of the amount of carbohydrates you eat. Eating carbohydrates naturally increases the level of sugar (glucose) in your blood, so it is important for you to know the amount that is okay for you to have in every meal. Carbohydrate counting helps keep the level of glucose in your blood within normal limits. The amount of carbohydrates allowed is different for every person. A dietitian can help you calculate the amount that is right for you. Once you know the amount of carbohydrates you can have, you can count the carbohydrates in the foods you want to eat. Carbohydrates are found in the following foods:  Grains, such as breads and cereals.  Dried beans and soy products.  Starchy vegetables, such as potatoes, peas, and corn.  Fruit and fruit juices.  Milk and yogurt.  Sweets and snack foods, such as cake, cookies, candy, chips, soft drinks, and fruit drinks. CARBOHYDRATE COUNTING There are two ways to count the carbohydrates in your food. You can use either of the methods or a combination of both. Reading the "Nutrition Facts" on [place] The "Nutrition Facts" is an area that is included on the labels of almost all packaged food and beverages in the [place]. It includes the serving size of that food or beverage and information about the nutrients in each serving of the food, including the grams (g) of carbohydrate per serving.  Decide the number of servings of this food or beverage that you will be able to eat or drink. Multiply that number of servings by the number of grams of carbohydrate that is listed on the label for that serving. The total will be the  amount of carbohydrates you will be having when you eat or drink this food or beverage. Learning Standard Serving Sizes of Food When you eat food that is not packaged or does not include "Nutrition Facts" on the label, you need to measure the servings in order to count the amount of carbohydrates.A serving of most carbohydrate-rich foods contains about 15 g of carbohydrates. The following list includes serving sizes of carbohydrate-rich foods that provide 15 g ofcarbohydrate per serving:   1 slice of bread (1 oz) or 1 six-inch tortilla.    of a hamburger bun or English muffin.  4-6 crackers.   cup unsweetened dry cereal.    cup hot cereal.   cup rice or pasta.    cup mashed potatoes or  of a large baked potato.  1 cup fresh fruit or one small piece of fruit.    cup canned or frozen fruit or fruit juice.  1 cup milk.   cup plain fat-free yogurt or yogurt sweetened with artificial sweeteners.   cup cooked dried beans or starchy vegetable, such as peas, corn, or potatoes.  Decide the number of standard-size servings that you will eat. Multiply that number of servings by 15 (the grams of carbohydrates in that serving). For example, if you eat 2 cups of strawberries, you will have eaten 2 servings and 30 g of carbohydrates (2 servings x 15 g = 30 g). For foods such as soups and casseroles, in which more than one food is mixed in, you will need to count the carbohydrates in each  food that is included. EXAMPLE OF CARBOHYDRATE COUNTING Sample Dinner  3 oz chicken breast.   cup of brown rice.   cup of corn.  1 cup milk.   1 cup strawberries with sugar-free whipped topping.  Carbohydrate Calculation Step 1: Identify the foods that contain carbohydrates:   Rice.   Corn.   Milk.   Strawberries. Step 2:Calculate the number of servings eaten of each:   2 servings of rice.   1 serving of corn.   1 serving of milk.   1 serving of  strawberries. Step 3: Multiply each of those number of servings by 15 g:   2 servings of rice x 15 g = 30 g.   1 serving of corn x 15 g = 15 g.   1 serving of milk x 15 g = 15 g.   1 serving of strawberries x 15 g = 15 g. Step 4: Add together all of the amounts to find the total grams of carbohydrates eaten: 30 g + 15 g + 15 g + 15 g = 75 g. Document Released: 05/24/2005 Document Revised: 10/08/2013 Document Reviewed: 04/20/2013 [facility] Patient Information 2015 [place], [place]. This information is not intended to replace advice given to you by your health care provider. Make sure you discuss any questions you have with your health care provider.

## 2015-01-10 NOTE — Progress Notes (Signed)
Pre visit review using our clinic review tool, if applicable. No additional management support is needed unless otherwise documented below in the visit note. 

## 2015-01-10 NOTE — Progress Notes (Signed)
[name]  [id number] 07/02/1950 01/10/2015      Progress Note-Follow Up  Subjective  Chief Complaint  Chief Complaint  Patient presents with  . Follow-up    HPI  Patient is a 64 y.o. male in today for routine medical care. He is here today for follow-up on numerous medical conditions and is in his normal state of health.Struggles daily with fatigue and shortness of breath with exertion but theseare stable. No recent illness. No acute concerns. The pigmented lesions on his legs are improving and no longer symptomatic. Denies CP/palp/HA/congestion/fevers/GI or GU c/o. Taking meds as prescribed  Past Medical History  Diagnosis Date  . Hypertension   . Hyperlipidemia   . Atrial fibrillation   . UNSPECIFIED ANEMIA 01/05/2010  . TESTICULAR HYPOFUNCTION 01/05/2010  . PULMONARY FUNCTION TESTS, ABNORMAL 02/02/2010  . PERIPHERAL NEUROPATHY 01/05/2010  . OBESITY NOS 01/25/2007  . NEUROMA 01/05/2010  . INSOMNIA, HX OF 01/25/2007  . HYPERTENSION 01/25/2007  . FATIGUE, CHRONIC 11/11/2009  . ERECTILE DYSFUNCTION 01/25/2007  . DYSPNEA 12/19/2009  . CHF 12/05/2009  . CHEST PAIN-UNSPECIFIED 11/11/2009  . BENIGN PROSTATIC HYPERTROPHY, HX OF 12/05/2009  . Atrial flutter 12/05/2009  . AORTIC STENOSIS, MODERATE 12/05/2009  . Knee pain, right 12/28/2010  . Hypoxia 05/05/2011  . Heart murmur   . SLEEP APNEA 04/06/2010    uses cpap  . DM 12/05/2009  . Superficial thrombophlebitis of left leg 09/07/2011  . Atypical chest pain 11/08/2011  . 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 Hyperkalemia 04/18/2012  . Edema 04/18/2012  . Debility 04/18/2012  . Toe pain, right 07/10/2012  . Gout 07/10/2012  . OSA (obstructive sleep apnea) 07/13/2014    Past Surgical History  Procedure Laterality Date  . Bilat foot surgery removed part of 5th metatarsal to corect curvature of toes    . Cardiac catheterization  10/16/2009    nonischemic cardiomyopathy    Family History  Problem Relation Age of Onset  . Clotting disorder Mother   .  Heart disease Mother     s/p MI  . Heart attack Mother   . Hypertension Mother   . Diabetes Mother   . Hyperlipidemia Mother   . Stroke Mother   . Cancer Father     ? lung  . Lung disease Father     smoker  . Diabetes Sister   . Hypertension Sister     smoker  . Leukemia Maternal Grandmother     ?  [name] Aneurysm Sister     brain  . Other Sister     clipped  . Seizures Sister     d/o w/aneurysm/ smoker    History   Social History  . Marital Status: Married    Spouse Name: N/A  . Number of Children: N/A  . Years of Education: N/A   Occupational History  . Not on file.   Social History Main Topics  . Smoking status: Former Smoker -- 1.00 packs/day for 10 years    Quit date: 06/07/1980  . Smokeless tobacco: Never Used  . Alcohol Use: No  . Drug Use: No  . Sexual Activity: Not Currently   Other Topics Concern  . Not on file   Social History Narrative    Current Outpatient Prescriptions on File Prior to Visit  Medication Sig Dispense Refill  . albuterol (PROVENTIL HFA;VENTOLIN HFA) 108 (90 BASE) MCG/ACT inhaler Inhale 2 puffs into the lungs every 6 (six) hours as needed for wheezing. 3 Inhaler 3  . allopurinol (ZYLOPRIM)  300 MG tablet Take 1 tablet (300 mg total) by mouth daily. 90 tablet 2  . aspirin EC 81 MG tablet Take 81 mg by mouth daily.    . benazepril (LOTENSIN) 40 MG tablet TAKE ONE TABLET BY MOUTH ONCE DAILY 90 tablet 3  . calcium carbonate (TUMS - DOSED IN MG ELEMENTAL CALCIUM) 500 MG chewable tablet Chew 1 tablet by mouth daily as needed. For heartburn    . cetirizine (ZYRTEC) 10 MG tablet Take 1 tablet (10 mg total) by mouth daily. 30 tablet 11  . Clobetasol Prop Emollient Base (CLOBETASOL PROPIONATE E) 0.05 % emollient cream Apply 1 application topically 2 (two) times daily. 60 g 0  . COLCRYS 0.6 MG tablet TAKE ONE TABLET BY MOUTH EVERY TWO HOURS FOR FOUR DOSES OR PAIN RELIEF TODAY. THEN TAKE ONE TABLET BY MOUTH ONCE DAILY 35 tablet 0  . fish  oil-omega-3 fatty acids 1000 MG capsule Take 1 g by mouth daily.     [name] FLUoxetine (PROZAC) 10 MG tablet Take 1 tablet (10 mg total) by mouth daily. 30 tablet 0  . furosemide (LASIX) 40 MG tablet TAKE ONE TABLET BY MOUTH ONCE DAILY 30 tablet 7  . glyBURIDE (DIABETA) 5 MG tablet Take 1 tablet (5 mg total) by mouth daily with breakfast. 90 tablet 3  . HYDROcodone-acetaminophen (NORCO) 10-325 MG per tablet Take 1 tablet by mouth every 8 (eight) hours as needed for moderate pain or severe pain. 90 tablet 0  . LORazepam (ATIVAN) 1 MG tablet Take 1 tablet (1 mg total) by mouth 2 (two) times daily as needed for anxiety. 60 tablet 2  . metFORMIN (GLUCOPHAGE) 1000 MG tablet TAKE ONE TABLET BY MOUTH TWICE DAILY WITH MEALS 60 tablet 11  . metoprolol (LOPRESSOR) 100 MG tablet TAKE ONE TABLET BY MOUTH TWICE DAILY 180 tablet 3  . Multiple Vitamins-Minerals (MULTIVITAMIN PO) Take 1 tablet by mouth daily.    . mupirocin ointment (BACTROBAN) 2 % Place 1 application into the nose 2 (two) times daily. 30 g 1  . naproxen (NAPROSYN) 375 MG tablet TAKE ONE TABLET BY MOUTH ONCE DAILY AS NEEDED FOR PAIN 30 tablet 4  . potassium chloride SA (KLOR-CON M20) 20 MEQ tablet Take 1 tablet (20 mEq total) by mouth daily. 30 tablet 5  . pravastatin (PRAVACHOL) 40 MG tablet TAKE ONE TABLET BY MOUTH ONCE DAILY 30 tablet 7  . temazepam (RESTORIL) 30 MG capsule TAKE ONE CAPSULE BY MOUTH AT BEDTIME AS NEEDED FOR  SLEEP  OR  ANXIETY 30 capsule 3  . Testosterone 10 MG/ACT (2%) GEL Use as directed    . TIKOSYN 500 MCG capsule TAKE 1 CAPSULE BY MOUTH EVERY 12 HOURS 60 capsule 10  . warfarin (COUMADIN) 4 MG tablet TAKE ONE TABLET BY MOUTH ONCE DAILY OR AS DIRECTED 90 tablet 1   No current facility-administered medications on file prior to visit.    Allergies  Allergen Reactions  . Sulfonamide Derivatives Other (See Comments)    unknown reaction.  Patient states had to go to the ER.   [name] Zolpidem     Excessive, prolonged sedation     Review of Systems  Review of Systems  Constitutional: Positive for malaise/fatigue. Negative for fever.  HENT: Negative for congestion.   Eyes: Negative for discharge.  Respiratory: Positive for shortness of breath.   Cardiovascular: Positive for palpitations. Negative for chest pain and leg swelling.  Gastrointestinal: Negative for nausea, abdominal pain and diarrhea.  Genitourinary: Negative for dysuria.  Musculoskeletal:  Negative for falls.  Skin: Negative for rash.  Neurological: Positive for tingling. Negative for loss of consciousness and headaches.  Endo/Heme/Allergies: Negative for polydipsia.  Psychiatric/Behavioral: Negative for depression and suicidal ideas. The patient is not nervous/anxious and does not have insomnia.     Objective  BP 122/82 mmHg  Pulse 65  Temp(Src) 98.3 F (36.8 C) (Oral)  Ht 5\' 11"  (1.803 m)  Wt 227 lb 2 oz (103.023 kg)  BMI 31.69 kg/m2  SpO2 98%  Physical Exam  Physical Exam  Constitutional: He is oriented to person, place, and time and well-developed, well-nourished, and in no distress. No distress.  HENT:  Head: Normocephalic and atraumatic.  Eyes: Conjunctivae are normal.  Neck: Neck supple. No thyromegaly present.  Cardiovascular: Normal rate, regular rhythm and normal heart sounds.   Pulmonary/Chest: Effort normal and breath sounds normal. No respiratory distress.  Abdominal: He exhibits no distension and no mass. There is no tenderness.  Musculoskeletal: He exhibits no edema.  Neurological: He is alert and oriented to person, place, and time.  Skin: Skin is warm. Rash noted.  Hyperpigmented lesions b/l lower legs  Psychiatric: Memory, affect and judgment normal.    Lab Results  Component Value Date   TSH 1.04 01/07/2015   Lab Results  Component Value Date   WBC 4.4 01/07/2015   HGB 14.4 01/07/2015   HCT 44.0 01/07/2015   MCV 79.4 01/07/2015   PLT 224.0 01/07/2015   Lab Results  Component Value Date   CREATININE  1.22 01/07/2015   BUN 18 01/07/2015   NA 142 01/07/2015   K 4.8 01/07/2015   CL 107 01/07/2015   CO2 29 01/07/2015   Lab Results  Component Value Date   ALT 11 01/07/2015   AST 15 01/07/2015   ALKPHOS 39 01/07/2015   BILITOT 0.5 01/07/2015   Lab Results  Component Value Date   CHOL 136 01/07/2015   Lab Results  Component Value Date   HDL 28.90* 01/07/2015   Lab Results  Component Value Date   LDLCALC 88 01/07/2015   Lab Results  Component Value Date   TRIG 98.0 01/07/2015   Lab Results  Component Value Date   CHOLHDL 5 01/07/2015     Assessment & Plan  Hyperlipidemia Tolerating statin, encouraged heart healthy diet, avoid trans fats, minimize simple carbs and saturated fats. Increase exercise as tolerated  Diabetes mellitus type 2 in obese [id number] acceptable, minimize simple carbs. Increase exercise as tolerated. Continue current meds  Essential hypertension Well controlled, no changes to meds. Encouraged heart healthy diet such as the DASH diet and exercise as tolerated.   Hypoxia Continues to struggle with sob with even mild exertion. This appears to be his baseline, Pulse Ox is 98% on RA  Chronic diastolic congestive heart failure Asymptomatic at this time. No new concerns.  OSA (obstructive sleep apnea) Follows with Dr Elsworth Soho, pulmonology, using CPAP  Pigmented skin lesions 1 lesion above both ankles hyperpigmented  Obesity Encouraged DASH diet, decrease po intake and increase exercise as tolerated. Needs 7-8 hours of sleep nightly. Avoid trans fats, eat small, frequent meals every 4-5 hours with lean proteins, complex carbs and healthy fats. Minimize simple carbs

## 2015-01-10 NOTE — Assessment & Plan Note (Signed)
Follows with Dr Elsworth Soho, pulmonology, using CPAP

## 2015-01-10 NOTE — Assessment & Plan Note (Signed)
Continues to struggle with sob with even mild exertion. This appears to be his baseline, Pulse Ox is 98% on RA

## 2015-01-10 NOTE — Assessment & Plan Note (Signed)
hgba1c acceptable, minimize simple carbs. Increase exercise as tolerated. Continue current meds 

## 2015-01-10 NOTE — Assessment & Plan Note (Signed)
1 lesion above both ankles hyperpigmented

## 2015-01-10 NOTE — Assessment & Plan Note (Signed)
Asymptomatic at this time. No new concerns.

## 2015-01-19 NOTE — Assessment & Plan Note (Signed)
Encouraged DASH diet, decrease po intake and increase exercise as tolerated. Needs 7-8 hours of sleep nightly. Avoid trans fats, eat small, frequent meals every 4-5 hours with lean proteins, complex carbs and healthy fats. Minimize simple carbs 

## 2015-01-20 ENCOUNTER — Ambulatory Visit (INDEPENDENT_AMBULATORY_CARE_PROVIDER_SITE_OTHER): Payer: Medicare Other | Admitting: Pharmacist Clinician (PhC)/ Clinical Pharmacy Specialist

## 2015-01-20 DIAGNOSIS — Z7901 Long term (current) use of anticoagulants: Secondary | ICD-10-CM | POA: Diagnosis not present

## 2015-01-20 DIAGNOSIS — I4891 Unspecified atrial fibrillation: Secondary | ICD-10-CM

## 2015-01-20 LAB — POCT INR: INR: 2.9

## 2015-01-26 ENCOUNTER — Other Ambulatory Visit: Payer: Self-pay | Admitting: Cardiovascular Disease

## 2015-01-26 ENCOUNTER — Other Ambulatory Visit: Payer: Self-pay | Admitting: Cardiology

## 2015-01-27 ENCOUNTER — Other Ambulatory Visit: Payer: Self-pay | Admitting: Cardiovascular Disease

## 2015-01-27 NOTE — Telephone Encounter (Signed)
Rx request sent to pharmacy.  

## 2015-02-23 ENCOUNTER — Other Ambulatory Visit: Payer: Self-pay | Admitting: Cardiovascular Disease

## 2015-02-24 ENCOUNTER — Ambulatory Visit (INDEPENDENT_AMBULATORY_CARE_PROVIDER_SITE_OTHER): Payer: Medicare Other | Admitting: Pharmacist Clinician (PhC)/ Clinical Pharmacy Specialist

## 2015-02-24 DIAGNOSIS — I4891 Unspecified atrial fibrillation: Secondary | ICD-10-CM | POA: Diagnosis not present

## 2015-02-24 DIAGNOSIS — Z7901 Long term (current) use of anticoagulants: Secondary | ICD-10-CM

## 2015-02-24 LAB — POCT INR: INR: 2.7

## 2015-02-24 NOTE — Telephone Encounter (Signed)
REFILL 

## 2015-03-24 ENCOUNTER — Encounter: Payer: Self-pay | Admitting: Cardiovascular Disease

## 2015-03-24 ENCOUNTER — Ambulatory Visit (INDEPENDENT_AMBULATORY_CARE_PROVIDER_SITE_OTHER): Payer: Medicare Other | Admitting: Cardiovascular Disease

## 2015-03-24 ENCOUNTER — Telehealth: Payer: Self-pay | Admitting: Cardiovascular Disease

## 2015-03-24 ENCOUNTER — Ambulatory Visit (INDEPENDENT_AMBULATORY_CARE_PROVIDER_SITE_OTHER): Payer: Medicare Other | Admitting: Pharmacist Clinician (PhC)/ Clinical Pharmacy Specialist

## 2015-03-24 ENCOUNTER — Encounter: Payer: Self-pay | Admitting: Internal Medicine

## 2015-03-24 VITALS — BP 122/80 | HR 128 | Ht 71.0 in | Wt 229.8 lb

## 2015-03-24 DIAGNOSIS — D689 Coagulation defect, unspecified: Secondary | ICD-10-CM

## 2015-03-24 DIAGNOSIS — Z01810 Encounter for preprocedural cardiovascular examination: Secondary | ICD-10-CM

## 2015-03-24 DIAGNOSIS — I4891 Unspecified atrial fibrillation: Secondary | ICD-10-CM

## 2015-03-24 DIAGNOSIS — R5383 Other fatigue: Secondary | ICD-10-CM | POA: Diagnosis not present

## 2015-03-24 DIAGNOSIS — Z7901 Long term (current) use of anticoagulants: Secondary | ICD-10-CM

## 2015-03-24 DIAGNOSIS — I480 Paroxysmal atrial fibrillation: Secondary | ICD-10-CM

## 2015-03-24 LAB — BASIC METABOLIC PANEL
BUN: 18 mg/dL (ref 7–25)
CHLORIDE: 108 mmol/L (ref 98–110)
CO2: 26 mmol/L (ref 20–31)
CREATININE: 1.39 mg/dL — AB (ref 0.70–1.25)
Calcium: 9.9 mg/dL (ref 8.6–10.3)
Glucose, Bld: 155 mg/dL — ABNORMAL HIGH (ref 65–99)
POTASSIUM: 5.2 mmol/L (ref 3.5–5.3)
SODIUM: 141 mmol/L (ref 135–146)

## 2015-03-24 LAB — CBC
HCT: 46.3 % (ref 39.0–52.0)
Hemoglobin: 15.3 g/dL (ref 13.0–17.0)
MCH: 25.9 pg — ABNORMAL LOW (ref 26.0–34.0)
MCHC: 33 g/dL (ref 30.0–36.0)
MCV: 78.3 fL (ref 78.0–100.0)
MPV: 9.5 fL (ref 8.6–12.4)
PLATELETS: 217 10*3/uL (ref 150–400)
RBC: 5.91 MIL/uL — AB (ref 4.22–5.81)
RDW: 16.5 % — AB (ref 11.5–15.5)
WBC: 4.4 10*3/uL (ref 4.0–10.5)

## 2015-03-24 LAB — PROTIME-INR
INR: 2.63 — AB (ref <1.50)
PROTHROMBIN TIME: 28.5 s — AB (ref 11.6–15.2)

## 2015-03-24 LAB — APTT: aPTT: 40 seconds — ABNORMAL HIGH (ref 24–37)

## 2015-03-24 LAB — POCT INR: INR: 3

## 2015-03-24 LAB — TSH: TSH: 2.391 u[IU]/mL (ref 0.350–4.500)

## 2015-03-24 NOTE — Telephone Encounter (Signed)
Received call from [place] at [facility] calling to report INR 2.63 PT 28.5.Message sent to Coumadin clinic.

## 2015-03-24 NOTE — H&P (Signed)
Patient ID: [name], male   DOB: [birth year], 64 y.o.   MRN: [id number]     Cardiology Office Note   Date:  03/24/2015   ID:  [name], DOB 05-13-1951, MRN [id number]  PCP:  Penni Homans, MD  Cardiologist:   Sanda Klein, MD   Chief Complaint  Patient presents with  . Congestive Heart Failure  . Atrial Fibrillation  . Essential hypertension      History of Present Illness: [name] is a 64 y.o. male who presents for  Follow-up for (diastolic>systolic) heart failure, paroxysmal atrial fibrillation , obstructive sleep apnea and severe systemic hypertension.  [name]'s breathing problems are about at baseline, functional class II. He does complain of more fatigue than usual, although he thinks this is because of very poor sleep.  He does not have any palpitations and was not aware of the fact that today he is in atrial fibrillation with rapid ventricular response at 128 bpm. He has been compliant with metoprolol 100 mg twice daily. It is unclear when the atrial fibrillation started. He was in normal sinus rhythm when I last saw him in May  And was also probably in sinus rhythm (heart rate 65 bpm) when he saw his primary care physician Dr. Charlett Blake in August.  He has been therapeutically anticoagulated without interruption and his INR today was 3.0.  He reports perfect compliance with CPAP for sleep apnea, but wakes up every 2-3 hours.  He has not had significant lower extremity edema and has not required diuretic dose adjustment in many months.  His latest echo showed that left ventricular systolic function is slightly depressed with an EF of 45-50%. He has moderate aortic insufficiency which appears to be secondary to mild dilatation of the aortic root. He has a long-standing history of severe hypertension which has not always been well treated. He also has long-standing history of obstructive sleep apnea which was also not treated until the last 2-3 years, but he is now  100% CPAP-compliant. Coronary angiography 2011 did not show evidence of significant stenoses. He presented with typical atrial flutter in 2011 and has recurrent paroxysmal atrial fibrillation with a good response to treatment with dofetilide.  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 Additional problems include obesity, type 2 diabetes mellitus, gout and androgen deficiency.   Past Medical History  Diagnosis Date  . Hypertension   . Hyperlipidemia   . Atrial fibrillation ([place])   . UNSPECIFIED ANEMIA 01/05/2010  . TESTICULAR HYPOFUNCTION 01/05/2010  . PULMONARY FUNCTION TESTS, ABNORMAL 02/02/2010  . PERIPHERAL NEUROPATHY 01/05/2010  . OBESITY NOS 01/25/2007  . NEUROMA 01/05/2010  . INSOMNIA, HX OF 01/25/2007  . HYPERTENSION 01/25/2007  . FATIGUE, CHRONIC 11/11/2009  . ERECTILE DYSFUNCTION 01/25/2007  . DYSPNEA 12/19/2009  . CHF 12/05/2009  . CHEST PAIN-UNSPECIFIED 11/11/2009  . BENIGN PROSTATIC HYPERTROPHY, HX OF 12/05/2009  . Atrial flutter ([place]) 12/05/2009  . AORTIC STENOSIS, MODERATE 12/05/2009  . Knee pain, right 12/28/2010  . Hypoxia 05/05/2011  . Heart murmur   . SLEEP APNEA 04/06/2010    uses cpap  . DM 12/05/2009  . Superficial thrombophlebitis of left leg 09/07/2011  . Atypical chest pain 11/08/2011  . 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 Hyperkalemia 04/18/2012  . Edema 04/18/2012  . Debility 04/18/2012  . Toe pain, right 07/10/2012  . Gout 07/10/2012  . OSA (obstructive sleep apnea) 07/13/2014    Past Surgical History  Procedure Laterality Date  .  Bilat foot surgery removed part of 5th metatarsal to corect curvature of toes    . Cardiac catheterization  10/16/2009    nonischemic cardiomyopathy     Current Outpatient Prescriptions  Medication Sig Dispense Refill  . albuterol (PROVENTIL HFA;VENTOLIN HFA) 108 (90 BASE) MCG/ACT inhaler Inhale 2 puffs into the lungs every 6 (six) hours as needed for wheezing. 3 Inhaler 3   . allopurinol (ZYLOPRIM) 300 MG tablet Take 1 tablet (300 mg total) by mouth daily. 90 tablet 2  . aspirin EC 81 MG tablet Take 81 mg by mouth daily.    . benazepril (LOTENSIN) 40 MG tablet TAKE ONE TABLET BY MOUTH ONCE DAILY 90 tablet 3  . calcium carbonate (TUMS - DOSED IN MG ELEMENTAL CALCIUM) 500 MG chewable tablet Chew 1 tablet by mouth daily as needed. For heartburn    . cetirizine (ZYRTEC) 10 MG tablet Take 1 tablet (10 mg total) by mouth daily. 30 tablet 11  . Clobetasol Prop Emollient Base (CLOBETASOL PROPIONATE E) 0.05 % emollient cream Apply 1 application topically 2 (two) times daily. 60 g 0  . COLCRYS 0.6 MG tablet TAKE ONE TABLET BY MOUTH EVERY TWO HOURS FOR FOUR DOSES OR PAIN RELIEF TODAY. THEN TAKE ONE TABLET BY MOUTH ONCE DAILY 35 tablet 0  . fish oil-omega-3 fatty acids 1000 MG capsule Take 1 g by mouth daily.     . furosemide (LASIX) 40 MG tablet TAKE ONE TABLET BY MOUTH ONCE DAILY 90 tablet 1  . glyBURIDE (DIABETA) 5 MG tablet Take 1 tablet (5 mg total) by mouth daily with breakfast. 90 tablet 3  . HYDROcodone-acetaminophen (NORCO) 10-325 MG per tablet Take 1 tablet by mouth every 8 (eight) hours as needed for moderate pain or severe pain. 90 tablet 0  . KLOR-CON M20 20 MEQ tablet TAKE ONE-HALF TO ONE TABLET BY MOUTH ONCE DAILY AS DIRECTED 90 tablet 1  . LORazepam (ATIVAN) 1 MG tablet Take 1 tablet (1 mg total) by mouth 2 (two) times daily as needed for anxiety. 60 tablet 2  . metFORMIN (GLUCOPHAGE) 1000 MG tablet TAKE ONE TABLET BY MOUTH TWICE DAILY WITH MEALS 60 tablet 11  . metoprolol (LOPRESSOR) 100 MG tablet TAKE ONE TABLET BY MOUTH TWICE DAILY 180 tablet 1  . Multiple Vitamins-Minerals (MULTIVITAMIN PO) Take 1 tablet by mouth daily.    . mupirocin ointment (BACTROBAN) 2 % Place 1 application into the nose 2 (two) times daily. 30 g 1  . naproxen (NAPROSYN) 375 MG tablet TAKE ONE TABLET BY MOUTH ONCE DAILY AS NEEDED FOR PAIN 30 tablet 4  . pravastatin (PRAVACHOL) 40 MG  tablet TAKE ONE TABLET BY MOUTH ONCE DAILY 30 tablet 6  . temazepam (RESTORIL) 30 MG capsule TAKE ONE CAPSULE BY MOUTH AT BEDTIME AS NEEDED FOR  SLEEP  OR  ANXIETY 30 capsule 3  . Testosterone 10 MG/ACT (2%) GEL Use as directed    . TIKOSYN 500 MCG capsule TAKE 1 CAPSULE BY MOUTH EVERY 12 HOURS 60 capsule 1  . warfarin (COUMADIN) 4 MG tablet TAKE ONE TABLET BY MOUTH ONCE DAILY OR AS DIRECTED 90 tablet 1   No current facility-administered medications for this visit.    Allergies:   Sulfonamide derivatives and Zolpidem    Social History:  The patient  reports that he quit smoking about 34 years ago. He has never used smokeless tobacco. He reports that he does not drink alcohol or use illicit drugs.   Family History:  The patient's family history includes  Aneurysm in his sister; Cancer in his father; Clotting disorder in his mother; Diabetes in his mother and sister; Heart attack in his mother; Heart disease in his mother; Hyperlipidemia in his mother; Hypertension in his mother and sister; Leukemia in his maternal grandmother; Lung disease in his father; Other in his sister; Seizures in his sister; Stroke in his mother.    ROS:  Please see the history of present illness.    Otherwise, review of systems positive for none.   All other systems are reviewed and negative.    PHYSICAL EXAM: VS:  BP 122/80 mmHg  Pulse 128  Ht 5\' 11"  (1.803 m)  Wt 229 lb 12.8 oz (104.237 kg)  BMI 32.06 kg/m2 , BMI Body mass index is 32.06 kg/(m^2).  General: Alert, oriented x3, no distress Head: no evidence of trauma, PERRL, EOMI, no exophtalmos or lid lag, no myxedema, no xanthelasma; normal ears, nose and oropharynx Neck: normal jugular venous pulsations and no hepatojugular reflux; brisk carotid pulses without delay and no carotid bruits Chest: clear to auscultation, no signs of consolidation by percussion or palpation, normal fremitus, symmetrical and full respiratory excursions Cardiovascular: normal  position and quality of the apical impulse, irregular rhythm, normal first and second heart sounds, no murmurs, rubs or gallops Abdomen: no tenderness or distention, no masses by palpation, no abnormal pulsatility or arterial bruits, normal bowel sounds, no hepatosplenomegaly Extremities: no clubbing, cyanosis or edema; 2+ radial, ulnar and brachial pulses bilaterally; 2+ right femoral, posterior tibial and dorsalis pedis pulses; 2+ left femoral, posterior tibial and dorsalis pedis pulses; no subclavian or femoral bruits Neurological: grossly nonfocal Psych: euthymic mood, full affect   EKG:  EKG is ordered today. The ekg ordered today demonstrates coarse atrial fibrillation (versus atrial flutter?) with RVR andd nonspecific IVCD QTc approx 470 ms.  Recent Labs: 01/07/2015: ALT 11; BUN 18; Creatinine, Ser 1.22; Hemoglobin 14.4; Platelets 224.0; Potassium 4.8; Sodium 142; TSH 1.04    Lipid Panel    Component Value Date/Time   CHOL 136 01/07/2015 1059   TRIG 98.0 01/07/2015 1059   HDL 28.90* 01/07/2015 1059   CHOLHDL 5 01/07/2015 1059   VLDL 19.6 01/07/2015 1059   LDLCALC 88 01/07/2015 1059      Wt Readings from Last 3 Encounters:  03/24/15 229 lb 12.8 oz (104.237 kg)  01/10/15 227 lb 2 oz (103.023 kg)  11/26/14 230 lb 3.2 oz (104.418 kg)     ASSESSMENT AND PLAN:  Chronic diastolic congestive heart failure  No recent episode of acute decompensation or need for increased diuretic therapy. He is reminded about the importance of sodium restriction, daily weight monitoring. Might have better exercise tolerance with increased diuretic dose, but he is very afraid of triggering a gout attack.  Atrial fibrillation (persistent?) Rate control is poor despite high-dose beta blocker. Have recommended that he undergo electrical cardioversion tomorrow (scheduled at 11AM with Dr. Debara Pickett). This procedure has been fully reviewed with the patient and written informed consent has been obtained. This  is his first documented atrial fibrillation since we started treatment with Tikosyn. He tolerated AF poorly in the past, probably due to significant diastolic dysfunction. He had convincing evidence of tachycardia-related cardiomyopathy in 2011.  I'm not sure how long the current episode has been going on. He is at relatively high risk of embolic events and should continue warfarin anticoagulation without interruption.  Reviewed the risk of drug interactions with dofetilide and with warfarin.  Moderate aortic insufficiency  Unlikely a cause for congestive  heart failure. Hypertension is likely the cause of his dilated aortic root, in turn the cause for his aortic valve insufficiency. The most important intervention is careful treatment of his high blood pressure. Repeat echo in 2017.  SLEEP APNEA  He again reports 100% compliance with CPAP therapy. No progress with weight loss. Repeated the recommendation for weight loss and we discussed calorie restriction and physical exercise. He cannot exercise a lot because of severe knee arthritis. In fact, right knee replacement has been recommended.     Current medicines are reviewed at length with the patient today.  The patient does not have concerns regarding medicines.  The following changes have been made:  no change  Labs/ tests ordered today include:   Orders Placed This Encounter  Procedures  . ELECTRICAL CARDIOVERSION  . CBC  . Basic metabolic panel  . APTT  . Protime-INR  . TSH  . EKG 12-Lead    Patient Instructions  Your physician has recommended that you have a Cardioversion (DCCV) tomorrow, 03/24/2015, with Dr Debara Pickett. Electrical Cardioversion uses a jolt of electricity to your heart either through paddles or wired patches attached to your chest. This is a controlled, usually prescheduled, procedure. Defibrillation is done under light anesthesia in the hospital, and you usually go home the day of the procedure. This is done to get  your heart back into a normal rhythm. You are not awake for the procedure. Please see the instruction sheet given to you today.  Your physician recommends that you return for lab work TODAY - STAT.      Mikael Spray, MD  03/24/2015 1:07 PM    Sanda Klein, MD, [facility] [phone] office [phone] pager

## 2015-03-24 NOTE — Patient Instructions (Signed)
Your physician has recommended that you have a Cardioversion (DCCV) tomorrow, 03/24/2015, with Dr Sallyanne Kuster. Electrical Cardioversion uses a jolt of electricity to your heart either through paddles or wired patches attached to your chest. This is a controlled, usually prescheduled, procedure. Defibrillation is done under light anesthesia in the hospital, and you usually go home the day of the procedure. This is done to get your heart back into a normal rhythm. You are not awake for the procedure. Please see the instruction sheet given to you today.  Your physician recommends that you return for lab work TODAY - STAT.

## 2015-03-25 ENCOUNTER — Ambulatory Visit (HOSPITAL_COMMUNITY): Payer: Medicare Other | Admitting: Anesthesiology

## 2015-03-25 ENCOUNTER — Encounter (HOSPITAL_COMMUNITY): Payer: Self-pay | Admitting: *Deleted

## 2015-03-25 ENCOUNTER — Encounter (HOSPITAL_COMMUNITY)
Admission: RE | Disposition: A | Discharge status: Home / Self Care | Payer: Self-pay | Source: Ambulatory Visit | Attending: Internal Medicine

## 2015-03-25 ENCOUNTER — Ambulatory Visit (HOSPITAL_COMMUNITY)
Admission: RE | Admit: 2015-03-25 | Discharge: 2015-03-25 | Disposition: A | Discharge status: Home / Self Care | Payer: Medicare Other | Source: Ambulatory Visit | Attending: Internal Medicine | Admitting: Internal Medicine

## 2015-03-25 DIAGNOSIS — I110 Hypertensive heart disease with heart failure: Secondary | ICD-10-CM | POA: Insufficient documentation

## 2015-03-25 DIAGNOSIS — I509 Heart failure, unspecified: Secondary | ICD-10-CM | POA: Diagnosis not present

## 2015-03-25 DIAGNOSIS — G4730 Sleep apnea, unspecified: Secondary | ICD-10-CM | POA: Diagnosis not present

## 2015-03-25 DIAGNOSIS — Z79891 Long term (current) use of opiate analgesic: Secondary | ICD-10-CM | POA: Diagnosis not present

## 2015-03-25 DIAGNOSIS — Z791 Long term (current) use of non-steroidal anti-inflammatories (NSAID): Secondary | ICD-10-CM | POA: Insufficient documentation

## 2015-03-25 DIAGNOSIS — Z7901 Long term (current) use of anticoagulants: Secondary | ICD-10-CM | POA: Insufficient documentation

## 2015-03-25 DIAGNOSIS — F419 Anxiety disorder, unspecified: Secondary | ICD-10-CM | POA: Diagnosis not present

## 2015-03-25 DIAGNOSIS — Z7984 Long term (current) use of oral hypoglycemic drugs: Secondary | ICD-10-CM | POA: Insufficient documentation

## 2015-03-25 DIAGNOSIS — I5042 Chronic combined systolic (congestive) and diastolic (congestive) heart failure: Secondary | ICD-10-CM | POA: Diagnosis present

## 2015-03-25 DIAGNOSIS — I4891 Unspecified atrial fibrillation: Secondary | ICD-10-CM | POA: Insufficient documentation

## 2015-03-25 DIAGNOSIS — N189 Chronic kidney disease, unspecified: Secondary | ICD-10-CM | POA: Diagnosis not present

## 2015-03-25 DIAGNOSIS — Z87891 Personal history of nicotine dependence: Secondary | ICD-10-CM | POA: Insufficient documentation

## 2015-03-25 DIAGNOSIS — E119 Type 2 diabetes mellitus without complications: Secondary | ICD-10-CM | POA: Insufficient documentation

## 2015-03-25 DIAGNOSIS — I480 Paroxysmal atrial fibrillation: Secondary | ICD-10-CM | POA: Diagnosis present

## 2015-03-25 DIAGNOSIS — Z7982 Long term (current) use of aspirin: Secondary | ICD-10-CM | POA: Diagnosis not present

## 2015-03-25 DIAGNOSIS — I129 Hypertensive chronic kidney disease with stage 1 through stage 4 chronic kidney disease, or unspecified chronic kidney disease: Secondary | ICD-10-CM | POA: Diagnosis not present

## 2015-03-25 HISTORY — PX: CARDIOVERSION: SHX1299

## 2015-03-25 LAB — GLUCOSE, CAPILLARY: Glucose-Capillary: 119 mg/dL — ABNORMAL HIGH (ref 65–99)

## 2015-03-25 SURGERY — CARDIOVERSION
Anesthesia: General

## 2015-03-25 MED ORDER — SODIUM CHLORIDE 0.9 % IV SOLN: INTRAVENOUS | Status: DC | PRN

## 2015-03-25 MED ORDER — LIDOCAINE HCL 2 % IJ SOLN
INTRAMUSCULAR | Status: DC | PRN
  Administered 2015-03-25: 60 mg via INTRADERMAL

## 2015-03-25 MED ORDER — PROPOFOL 10 MG/ML IV BOLUS
INTRAVENOUS | Status: DC | PRN
  Administered 2015-03-25: 160 mg via INTRAVENOUS

## 2015-03-25 MED ORDER — SODIUM CHLORIDE 0.9 % IV SOLN
INTRAVENOUS | Status: DC
  Administered 2015-03-25 (×2): via INTRAVENOUS

## 2015-03-25 NOTE — CV Procedure (Signed)
    CARDIOVERSION NOTE  Procedure: Electrical Cardioversion Indications:  Atrial Fibrillation  Procedure Details:  Consent: Risks of procedure as well as the alternatives and risks of each were explained to the (patient/caregiver).  Consent for procedure obtained.  Time Out: Verified patient identification, verified procedure, site/side was marked, verified correct patient position, special equipment/implants available, medications/allergies/relevent history reviewed, required imaging and test results available.  Performed  Patient placed on cardiac monitor, pulse oximetry, supplemental oxygen as necessary.  Sedation given: Propofol per anesthesia Pacer pads placed anterior and posterior chest.  Cardioverted 1 time(s).  Cardioverted at 150J biphasic.  Impression: Findings: Post procedure EKG shows: NSR Complications: None Patient did tolerate procedure well.  Plan: 1. Continue tikosyn and warfarin. Follow-up with Dr. Sallyanne Kuster.  Time Spent Directly with the Patient:  30 minutes   Pixie Casino, MD, [facility] Attending Cardiologist [place] 03/25/2015, 11:27 AM

## 2015-03-25 NOTE — Anesthesia Postprocedure Evaluation (Signed)
  Anesthesia Post-op Note  Patient: [name]  Procedure(s) Performed: Procedure(s): CARDIOVERSION (N/A)  Patient Location: Endoscopy Unit  Anesthesia Type:General  Level of Consciousness: awake, alert  and oriented  Airway and Oxygen Therapy: Patient Spontanous Breathing and Patient connected to face mask oxygen  Post-op Pain: none  Post-op Assessment: Post-op Vital signs reviewed, Patient's Cardiovascular Status Stable, Respiratory Function Stable, Patent Airway and No signs of Nausea or vomiting              Post-op Vital Signs: Reviewed and stable  Last Vitals:  Filed Vitals:   03/25/15 1002  BP: 122/92  Pulse: 110  Resp: 18    Complications: No apparent anesthesia complications

## 2015-03-25 NOTE — Transfer of Care (Signed)
Immediate Anesthesia Transfer of Care Note  Patient: [name]  Procedure(s) Performed: Procedure(s): CARDIOVERSION (N/A)  Patient Location: Endoscopy Unit  Anesthesia Type:General  Level of Consciousness: awake, alert  and oriented  Airway & Oxygen Therapy: Patient Spontanous Breathing and Patient connected to face mask oxygen  Post-op Assessment: Report given to RN and Post -op Vital signs reviewed and stable  Post vital signs: Reviewed and stable  Last Vitals:  Filed Vitals:   03/25/15 1002  BP: 122/92  Pulse: 110  Resp: 18    Complications: No apparent anesthesia complications

## 2015-03-25 NOTE — Discharge Instructions (Signed)
Electrical Cardioversion °Electrical cardioversion is the delivery of a jolt of electricity to change the rhythm of the heart. Sticky patches or metal paddles are placed on the chest to deliver the electricity from a device. This is done to restore a normal rhythm. A rhythm that is too fast or not regular keeps the heart from pumping well. °Electrical cardioversion is done in an emergency if:  °· There is low or no blood pressure as a result of the heart rhythm.   °· Normal rhythm must be restored as fast as possible to protect the brain and heart from further damage.   °· It may save a life. °Cardioversion may be done for heart rhythms that are not immediately life threatening, such as atrial fibrillation or flutter, in which:  °· The heart is beating too fast or is not regular.   °· Medicine to change the rhythm has not worked.   °· It is safe to wait in order to allow time for preparation. °· Symptoms of the abnormal rhythm are bothersome. °· The risk of stroke and other serious problems can be reduced. °LET YOUR HEALTH CARE PROVIDER KNOW ABOUT:  °· Any allergies you have. °· All medicines you are taking, including vitamins, herbs, eye drops, creams, and over-the-counter medicines. °· Previous problems you or members of your family have had with the use of anesthetics.   °· Any blood disorders you have.   °· Previous surgeries you have had.   °· Medical conditions you have. °RISKS AND COMPLICATIONS  °Generally, this is a safe procedure. However, problems can occur and include:  °· Breathing problems related to the anesthetic used. °· A blood clot that breaks free and travels to other parts of your body. This could cause a stroke or other problems. The risk of this is lowered by use of blood-thinning medicine (anticoagulant) prior to the procedure. °· Cardiac arrest (rare). °BEFORE THE PROCEDURE  °· You may have tests to detect blood clots in your heart and to evaluate heart function.  °· You may start taking  anticoagulants so your blood does not clot as easily.   °· Medicines may be given to help stabilize your heart rate and rhythm. °PROCEDURE °· You will be given medicine through an IV tube to reduce discomfort and make you sleepy (sedative).   °· An electrical shock will be delivered. °AFTER THE PROCEDURE °Your heart rhythm will be watched to make sure it does not change.  °  °This information is not intended to replace advice given to you by your health care provider. Make sure you discuss any questions you have with your health care provider. °  °Document Released: 05/14/2002 Document Revised: 06/14/2014 Document Reviewed: 12/06/2012 °Elsevier Interactive Patient Education ©2016 [employer] ° °

## 2015-03-25 NOTE — H&P (Signed)
     INTERVAL PROCEDURE H&P  History and Physical Interval Note:  03/25/2015 11:08 AM  [name] has presented today for their planned procedure. The various methods of treatment have been discussed with the patient and family. After consideration of risks, benefits and other options for treatment, the patient has consented to the procedure.  The patients' outpatient history has been reviewed, patient examined, and no change in status from most recent office note within the past 30 days. I have reviewed the patients' chart and labs and will proceed as planned. Questions were answered to the patient's satisfaction.   Pixie Casino, MD, [facility] Attending Cardiologist [place] 03/25/2015, 11:08 AM

## 2015-03-25 NOTE — Anesthesia Postprocedure Evaluation (Signed)
  Anesthesia Post-op Note  Patient: [name]  Procedure(s) Performed: Procedure(s): CARDIOVERSION (N/A)  Patient Location: Endoscopy  Anesthesia Type:General  Level of Consciousness: awake, alert  and oriented  Airway and Oxygen Therapy: Patient Spontanous Breathing  Post-op Pain: none  Post-op Assessment: Post-op Vital signs reviewed, Patient's Cardiovascular Status Stable, Respiratory Function Stable, Patent Airway and Pain level controlled              Post-op Vital Signs: stable  Last Vitals:  Filed Vitals:   03/25/15 1140  BP: 112/71  Pulse: 73  Resp: 20    Complications: No apparent anesthesia complications

## 2015-03-25 NOTE — Anesthesia Preprocedure Evaluation (Addendum)
Anesthesia Evaluation  Patient identified by MRN, date of birth, ID band Patient awake    Reviewed: Allergy & Precautions, NPO status , Patient's Chart, lab work & pertinent test results  Airway Mallampati: III  TM Distance: >3 FB Neck ROM: Full    Dental   Pulmonary sleep apnea , former smoker,    breath sounds clear to auscultation- rhonchi       Cardiovascular hypertension, Pt. on medications and Pt. on home beta blockers +CHF   Rhythm:Regular Rate:Normal     Neuro/Psych Anxiety negative neurological ROS     GI/Hepatic negative GI ROS, Neg liver ROS,   Endo/Other  diabetes, Type 2, Oral Hypoglycemic Agents  Renal/GU Renal InsufficiencyRenal disease     Musculoskeletal   Abdominal   Peds  Hematology negative hematology ROS (+)   Anesthesia Other Findings   Reproductive/Obstetrics                           Anesthesia Physical Anesthesia Plan  ASA: III  Anesthesia Plan: General   Post-op Pain Management:    Induction: Intravenous  Airway Management Planned: Mask  Additional Equipment:   Intra-op Plan:   Post-operative Plan:   Informed Consent: I have reviewed the patients History and Physical, chart, labs and discussed the procedure including the risks, benefits and alternatives for the proposed anesthesia with the patient or authorized representative who has indicated his/her understanding and acceptance.     Plan Discussed with: CRNA and Anesthesiologist  Anesthesia Plan Comments:        Anesthesia Quick Evaluation

## 2015-03-26 ENCOUNTER — Encounter (HOSPITAL_COMMUNITY): Payer: Self-pay | Admitting: Internal Medicine

## 2015-03-27 NOTE — Telephone Encounter (Signed)
Can this encounter be closed?

## 2015-03-28 NOTE — Telephone Encounter (Signed)
Anticoag encounter by Erasmo Downer same-day - telephone note closed.

## 2015-03-29 ENCOUNTER — Other Ambulatory Visit: Payer: Self-pay | Admitting: Cardiovascular Disease

## 2015-03-30 ENCOUNTER — Other Ambulatory Visit: Payer: Self-pay | Admitting: Cardiovascular Disease

## 2015-04-06 ENCOUNTER — Other Ambulatory Visit: Payer: Self-pay | Admitting: Cardiovascular Disease

## 2015-04-14 ENCOUNTER — Encounter: Payer: Self-pay | Admitting: Physician Assistant

## 2015-04-14 ENCOUNTER — Encounter: Payer: Self-pay | Admitting: Family Medicine

## 2015-04-14 ENCOUNTER — Ambulatory Visit (INDEPENDENT_AMBULATORY_CARE_PROVIDER_SITE_OTHER): Payer: Medicare Other | Admitting: Physician Assistant

## 2015-04-14 ENCOUNTER — Ambulatory Visit (INDEPENDENT_AMBULATORY_CARE_PROVIDER_SITE_OTHER): Payer: Medicare Other | Admitting: Family Medicine

## 2015-04-14 ENCOUNTER — Ambulatory Visit (INDEPENDENT_AMBULATORY_CARE_PROVIDER_SITE_OTHER): Payer: Medicare Other | Admitting: Pharmacist Clinician (PhC)/ Clinical Pharmacy Specialist

## 2015-04-14 VITALS — BP 122/74 | HR 70 | Ht 71.0 in | Wt 230.8 lb

## 2015-04-14 VITALS — BP 135/78 | HR 73 | Temp 97.7°F | Ht 71.0 in | Wt 232.1 lb

## 2015-04-14 DIAGNOSIS — I4891 Unspecified atrial fibrillation: Secondary | ICD-10-CM

## 2015-04-14 DIAGNOSIS — E1169 Type 2 diabetes mellitus with other specified complication: Secondary | ICD-10-CM

## 2015-04-14 DIAGNOSIS — G4700 Insomnia, unspecified: Secondary | ICD-10-CM

## 2015-04-14 DIAGNOSIS — Z7901 Long term (current) use of anticoagulants: Secondary | ICD-10-CM

## 2015-04-14 DIAGNOSIS — I480 Paroxysmal atrial fibrillation: Secondary | ICD-10-CM

## 2015-04-14 DIAGNOSIS — E669 Obesity, unspecified: Secondary | ICD-10-CM

## 2015-04-14 DIAGNOSIS — Z23 Encounter for immunization: Secondary | ICD-10-CM | POA: Diagnosis not present

## 2015-04-14 DIAGNOSIS — M25561 Pain in right knee: Secondary | ICD-10-CM | POA: Diagnosis not present

## 2015-04-14 DIAGNOSIS — Z79899 Other long term (current) drug therapy: Secondary | ICD-10-CM | POA: Diagnosis not present

## 2015-04-14 DIAGNOSIS — E119 Type 2 diabetes mellitus without complications: Secondary | ICD-10-CM

## 2015-04-14 DIAGNOSIS — E875 Hyperkalemia: Secondary | ICD-10-CM

## 2015-04-14 DIAGNOSIS — F411 Generalized anxiety disorder: Secondary | ICD-10-CM | POA: Diagnosis not present

## 2015-04-14 DIAGNOSIS — I10 Essential (primary) hypertension: Secondary | ICD-10-CM

## 2015-04-14 DIAGNOSIS — D649 Anemia, unspecified: Secondary | ICD-10-CM | POA: Diagnosis not present

## 2015-04-14 DIAGNOSIS — M25562 Pain in left knee: Secondary | ICD-10-CM

## 2015-04-14 DIAGNOSIS — E785 Hyperlipidemia, unspecified: Secondary | ICD-10-CM | POA: Diagnosis not present

## 2015-04-14 DIAGNOSIS — M109 Gout, unspecified: Secondary | ICD-10-CM

## 2015-04-14 DIAGNOSIS — H409 Unspecified glaucoma: Secondary | ICD-10-CM

## 2015-04-14 LAB — POCT INR: INR: 3.7

## 2015-04-14 LAB — MAGNESIUM: MAGNESIUM: 1.7 mg/dL (ref 1.5–2.5)

## 2015-04-14 MED ORDER — ALLOPURINOL 300 MG PO TABS: 300.0000 mg | ORAL_TABLET | Freq: Every day | ORAL | Status: DC

## 2015-04-14 MED ORDER — LORAZEPAM 1 MG PO TABS: 1.0000 mg | ORAL_TABLET | Freq: Two times a day (BID) | ORAL | Status: DC | PRN

## 2015-04-14 MED ORDER — HYDROCODONE-ACETAMINOPHEN 10-325 MG PO TABS
1.0000 | ORAL_TABLET | Freq: Three times a day (TID) | ORAL | Status: DC | PRN
Start: 2015-04-14 — End: 2015-07-15

## 2015-04-14 MED ORDER — TEMAZEPAM 30 MG PO CAPS: ORAL_CAPSULE | ORAL | Status: DC

## 2015-04-14 NOTE — Patient Instructions (Signed)
Your physician recommends that you return for lab work TODAY.   Rosaria Ferries, PA-C, recommends that you follow-up with Dr Sallyanne Kuster first available.

## 2015-04-14 NOTE — Progress Notes (Signed)
Cardiology Office Note   Date:  04/14/2015   ID:  PLEASANT BENSINGER, DOB 1950-11-05, MRN [id number]  PCP:  Penni Homans, MD  Cardiologist:  Dr Juanetta Snow, PA-C   Chief Complaint  Patient presents with  . Follow-up    post cardioversion, pt c/o no chest pain, no swelling in legs, no dizziness,   . Shortness of Breath    pt c/o when doing everyday function     History of Present Illness: [name] is a 64 y.o. male with a history of D>S-CHF, PAF, OSA, and HTN. S/p DCCV 10/18.    [name] presents for post-hospital follow-up.  Since discharge from the hospital, he has done very well. He has had no palpitations, no shortness of breath.   He does not know exactly why he went into atrial fibrillation but had been under some emotional stress prior to going back into A. fib. His major symptom with that was fatigue, which he has not had. He is compliant with his medications. He has had 2 or 3 breakthroughs since he was started on the Tikosyn, but feels in general it works very well for him.  His family physician checks his labs on a regular basis, but has not been including a magnesium. He has not had a Coumadin check since his cardioversion.   Past Medical History  Diagnosis Date  . Hypertension   . Hyperlipidemia   . Atrial fibrillation ([place])   . UNSPECIFIED ANEMIA 01/05/2010  . TESTICULAR HYPOFUNCTION 01/05/2010  . PULMONARY FUNCTION TESTS, ABNORMAL 02/02/2010  . PERIPHERAL NEUROPATHY 01/05/2010  . OBESITY NOS 01/25/2007  . NEUROMA 01/05/2010  . INSOMNIA, HX OF 01/25/2007  . HYPERTENSION 01/25/2007  . FATIGUE, CHRONIC 11/11/2009  . ERECTILE DYSFUNCTION 01/25/2007  . DYSPNEA 12/19/2009  . CHF 12/05/2009  . CHEST PAIN-UNSPECIFIED 11/11/2009  . BENIGN PROSTATIC HYPERTROPHY, HX OF 12/05/2009  . Atrial flutter ([place]) 12/05/2009  . AORTIC STENOSIS, MODERATE 12/05/2009  . Knee pain, right 12/28/2010  . Hypoxia 05/05/2011  . Heart murmur   . SLEEP APNEA 04/06/2010   uses cpap  . DM 12/05/2009  . Superficial thrombophlebitis of left leg 09/07/2011  . Atypical chest pain 11/08/2011  . 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 Hyperkalemia 04/18/2012  . Edema 04/18/2012  . Debility 04/18/2012  . Toe pain, right 07/10/2012  . Gout 07/10/2012  . OSA (obstructive sleep apnea) 07/13/2014    Past Surgical History  Procedure Laterality Date  . Bilat foot surgery removed part of 5th metatarsal to corect curvature of toes    . Cardiac catheterization  10/16/2009    nonischemic cardiomyopathy  . Cardioversion N/A 03/25/2015    Procedure: CARDIOVERSION;  Surgeon: Pixie Casino, MD;  Location: [facility] ENDOSCOPY;  Service: Cardiovascular;  Laterality: N/A;    Current Outpatient Prescriptions  Medication Sig Dispense Refill  . albuterol (PROVENTIL HFA;VENTOLIN HFA) 108 (90 BASE) MCG/ACT inhaler Inhale 2 puffs into the lungs every 6 (six) hours as needed for wheezing. 3 Inhaler 3  . allopurinol (ZYLOPRIM) 300 MG tablet Take 1 tablet (300 mg total) by mouth daily. 90 tablet 2  . aspirin EC 81 MG tablet Take 81 mg by mouth daily.    . benazepril (LOTENSIN) 40 MG tablet TAKE ONE TABLET BY MOUTH ONCE DAILY 90 tablet 1  . calcium carbonate (TUMS - DOSED IN MG ELEMENTAL CALCIUM) 500 MG chewable tablet Chew 1 tablet by mouth daily as needed. For heartburn    . cetirizine (ZYRTEC)  10 MG tablet Take 1 tablet (10 mg total) by mouth daily. 30 tablet 11  . Clobetasol Prop Emollient Base (CLOBETASOL PROPIONATE E) 0.05 % emollient cream Apply 1 application topically 2 (two) times daily. (Patient taking differently: Apply 1 application topically 2 (two) times daily as needed (insect bite). ) 60 g 0  . COLCRYS 0.6 MG tablet TAKE ONE TABLET BY MOUTH EVERY TWO HOURS FOR FOUR DOSES OR PAIN RELIEF TODAY. THEN TAKE ONE TABLET BY MOUTH ONCE DAILY (Patient taking differently: TAKE ONE TABLET BY MOUTH DAILY AS NEEDED FOR GOUT FLARE UPS.) 35 tablet 0  . fish oil-omega-3 fatty acids 1000 MG capsule Take 1 g by mouth  daily.     . furosemide (LASIX) 40 MG tablet TAKE ONE TABLET BY MOUTH ONCE DAILY 90 tablet 1  . glyBURIDE (DIABETA) 5 MG tablet Take 1 tablet (5 mg total) by mouth daily with breakfast. 90 tablet 3  . HYDROcodone-acetaminophen (NORCO) 10-325 MG per tablet Take 1 tablet by mouth every 8 (eight) hours as needed for moderate pain or severe pain. 90 tablet 0  . KLOR-CON M20 20 MEQ tablet TAKE ONE-HALF TO ONE TABLET BY MOUTH ONCE DAILY AS DIRECTED 90 tablet 1  . LORazepam (ATIVAN) 1 MG tablet Take 1 tablet (1 mg total) by mouth 2 (two) times daily as needed for anxiety. 60 tablet 2  . metFORMIN (GLUCOPHAGE) 1000 MG tablet TAKE ONE TABLET BY MOUTH TWICE DAILY WITH MEALS 60 tablet 11  . metoprolol (LOPRESSOR) 100 MG tablet TAKE ONE TABLET BY MOUTH TWICE DAILY 180 tablet 1  . Multiple Vitamins-Minerals (MULTIVITAMIN PO) Take 1 tablet by mouth daily.    . mupirocin ointment (BACTROBAN) 2 % Place 1 application into the nose 2 (two) times daily. 30 g 1  . naproxen (NAPROSYN) 375 MG tablet TAKE ONE TABLET BY MOUTH ONCE DAILY AS NEEDED FOR PAIN 30 tablet 4  . pravastatin (PRAVACHOL) 40 MG tablet TAKE ONE TABLET BY MOUTH ONCE DAILY 30 tablet 6  . temazepam (RESTORIL) 30 MG capsule TAKE ONE CAPSULE BY MOUTH AT BEDTIME AS NEEDED FOR  SLEEP  OR  ANXIETY 30 capsule 3  . Testosterone 10 MG/ACT (2%) GEL Apply 1 application topically daily. Use as directed    . TIKOSYN 500 MCG capsule TAKE 1 CAPSULE BY MOUTH EVERY 12 HOURS 60 capsule 1  . warfarin (COUMADIN) 4 MG tablet TAKE ONE TABLET BY MOUTH ONCE DAILY OR AS DIRECTED 90 tablet 1   No current facility-administered medications for this visit.    Allergies:   Sulfonamide derivatives and Zolpidem    Social History:  The patient  reports that he quit smoking about 34 years ago. He has never used smokeless tobacco. He reports that he does not drink alcohol or use illicit drugs.   Family History:  The patient's family history includes Aneurysm in his sister; Cancer  in his father; Clotting disorder in his mother; Diabetes in his mother and sister; Heart attack in his mother; Heart disease in his mother; Hyperlipidemia in his mother; Hypertension in his mother and sister; Leukemia in his maternal grandmother; Lung disease in his father; Other in his sister; Seizures in his sister; Stroke in his mother.    ROS:  Please see the history of present illness. All other systems are reviewed and negative.    PHYSICAL EXAM: VS:  BP 122/74 mmHg  Pulse 70  Ht 5\' 11"  (1.803 m)  Wt 230 lb 12.8 oz (104.69 kg)  BMI 32.20 kg/m2 ,  BMI Body mass index is 32.2 kg/(m^2). GEN: Well nourished, well developed, male in no acute distress HEENT: normal for age  Neck: no JVD, no carotid bruit, no masses Cardiac: RRR; diastolic murmur, no rubs, or gallops Respiratory:  clear to auscultation bilaterally, normal work of breathing. Cardioversion sites are well healed GI: soft, nontender, nondistended, + BS MS: no deformity or atrophy; no edema; distal pulses are 2+ in all 4 extremities  Skin: warm and dry, no rash Neuro:  Strength and sensation are intact Psych: euthymic mood, full affect   EKG:  EKG is ordered today. The ekg ordered today demonstrates SR, HR 70, no acute changes.   Recent Labs: 01/07/2015: ALT 11 03/24/2015: BUN 18; Creat 1.39*; Hemoglobin 15.3; Platelets 217; Potassium 5.2; Sodium 141; TSH 2.391    Lipid Panel    Component Value Date/Time   CHOL 136 01/07/2015 1059   TRIG 98.0 01/07/2015 1059   HDL 28.90* 01/07/2015 1059   CHOLHDL 5 01/07/2015 1059   VLDL 19.6 01/07/2015 1059   LDLCALC 88 01/07/2015 1059     Wt Readings from Last 3 Encounters:  04/14/15 230 lb 12.8 oz (104.69 kg)  03/24/15 229 lb 12.8 oz (104.237 kg)  01/10/15 227 lb 2 oz (103.023 kg)     Other studies Reviewed: Additional studies/ records that were reviewed today include: hospital records, ECGs, office notes.  ASSESSMENT AND PLAN:  1.  PAF: s/p DCCV, maintaining SR on  Tikosyn. Continue this. We will check a magnesium today. He is to mention this to his family physician when he sees Dr. Charlett Blake. If Dr. Charlett Blake wishes to follow his magnesium in addition to his other labs, that will be very helpful.   Continue to keep his potassium greater than 4.0, and his magnesium 2.0.  2. Chronic anticoagulation with Coumadin: We'll do a Coumadin check today and then resume his usual schedule.  Current medicines are reviewed at length with the patient today.  The patient does not have concerns regarding medicines.  The following changes have been made:  no change  Labs/ tests ordered today include:   Orders Placed This Encounter  Procedures  . Magnesium  . EKG 12-Lead     Disposition:   FU with Dr. Sallyanne Kuster  Signed, Rosaria Ferries, PA-C  04/14/2015 12:00 PM    [address], [place], [place]  [address] Phone: [phone]; Fax: [phone]

## 2015-04-14 NOTE — Patient Instructions (Addendum)
Hep C and HIV screens if insurance will cover for screening purposes Do they cover Cologuard  Check with insurance to make sure they will pay for Prevnar (PCV 13)  Basic Carbohydrate Counting for Diabetes Mellitus Carbohydrate counting is a method for keeping track of the amount of carbohydrates you eat. Eating carbohydrates naturally increases the level of sugar (glucose) in your blood, so it is important for you to know the amount that is okay for you to have in every meal. Carbohydrate counting helps keep the level of glucose in your blood within normal limits. The amount of carbohydrates allowed is different for every person. A dietitian can help you calculate the amount that is right for you. Once you know the amount of carbohydrates you can have, you can count the carbohydrates in the foods you want to eat. Carbohydrates are found in the following foods:  Grains, such as breads and cereals.  Dried beans and soy products.  Starchy vegetables, such as potatoes, peas, and corn.  Fruit and fruit juices.  Milk and yogurt.  Sweets and snack foods, such as cake, cookies, candy, chips, soft drinks, and fruit drinks. CARBOHYDRATE COUNTING There are two ways to count the carbohydrates in your food. You can use either of the methods or a combination of both. Reading the "Nutrition Facts" on [place] The "Nutrition Facts" is an area that is included on the labels of almost all packaged food and beverages in the [place]. It includes the serving size of that food or beverage and information about the nutrients in each serving of the food, including the grams (g) of carbohydrate per serving.  Decide the number of servings of this food or beverage that you will be able to eat or drink. Multiply that number of servings by the number of grams of carbohydrate that is listed on the label for that serving. The total will be the amount of carbohydrates you will be having when you eat or drink this  food or beverage. Learning Standard Serving Sizes of Food When you eat food that is not packaged or does not include "Nutrition Facts" on the label, you need to measure the servings in order to count the amount of carbohydrates.A serving of most carbohydrate-rich foods contains about 15 g of carbohydrates. The following list includes serving sizes of carbohydrate-rich foods that provide 15 g ofcarbohydrate per serving:   1 slice of bread (1 oz) or 1 six-inch tortilla.    of a hamburger bun or English muffin.  4-6 crackers.   cup unsweetened dry cereal.    cup hot cereal.   cup rice or pasta.    cup mashed potatoes or  of a large baked potato.  1 cup fresh fruit or one small piece of fruit.    cup canned or frozen fruit or fruit juice.  1 cup milk.   cup plain fat-free yogurt or yogurt sweetened with artificial sweeteners.   cup cooked dried beans or starchy vegetable, such as peas, corn, or potatoes.  Decide the number of standard-size servings that you will eat. Multiply that number of servings by 15 (the grams of carbohydrates in that serving). For example, if you eat 2 cups of strawberries, you will have eaten 2 servings and 30 g of carbohydrates (2 servings x 15 g = 30 g). For foods such as soups and casseroles, in which more than one food is mixed in, you will need to count the carbohydrates in each food that is included. EXAMPLE  OF CARBOHYDRATE COUNTING Sample Dinner  3 oz chicken breast.   cup of brown rice.   cup of corn.  1 cup milk.   1 cup strawberries with sugar-free whipped topping.  Carbohydrate Calculation Step 1: Identify the foods that contain carbohydrates:   Rice.   Corn.   Milk.   Strawberries. Step 2:Calculate the number of servings eaten of each:   2 servings of rice.   1 serving of corn.   1 serving of milk.   1 serving of strawberries. Step 3: Multiply each of those number of servings by 15 g:   2  servings of rice x 15 g = 30 g.   1 serving of corn x 15 g = 15 g.   1 serving of milk x 15 g = 15 g.   1 serving of strawberries x 15 g = 15 g. Step 4: Add together all of the amounts to find the total grams of carbohydrates eaten: 30 g + 15 g + 15 g + 15 g = 75 g.   This information is not intended to replace advice given to you by your health care provider. Make sure you discuss any questions you have with your health care provider.   Document Released: 05/24/2005 Document Revised: 06/14/2014 Document Reviewed: 04/20/2013 Elsevier Interactive Patient Education [employer].

## 2015-04-14 NOTE — Progress Notes (Signed)
[name] [id number] Feb 20, 1951 04/14/2015      Patient Progress Note   Subjective  Chief Complaint  Chief Complaint  Patient presents with  . Follow-up     3 month    HPI  Patient returns for 3 month check up. Feeling consistent with baseline now, but not until his cardioversion on 03/25/15. Up to that point he was feeling increased fatigue and shortness of breath. He went to his cardiologist for routine visit and was in atrial fibrillation and was converted. He has since felt increasingly better, but not good still. He does endorse this is about his baseline. His bilateral knee pain is still there, but controlled with elevation, over the counters, and ice. He has seen a specialist for consultation regarding replacement but is not interested. He continues to be compliant with his CPAP. Patient denies chest pain,changes in urination, GI issues, recent fevers or illnesses    Past Medical History  Diagnosis Date  . Hypertension   . Hyperlipidemia   . Atrial fibrillation ([place])   . UNSPECIFIED ANEMIA 01/05/2010  . TESTICULAR HYPOFUNCTION 01/05/2010  . PULMONARY FUNCTION TESTS, ABNORMAL 02/02/2010  . PERIPHERAL NEUROPATHY 01/05/2010  . OBESITY NOS 01/25/2007  . NEUROMA 01/05/2010  . INSOMNIA, HX OF 01/25/2007  . HYPERTENSION 01/25/2007  . FATIGUE, CHRONIC 11/11/2009  . ERECTILE DYSFUNCTION 01/25/2007  . DYSPNEA 12/19/2009  . CHF 12/05/2009  . CHEST PAIN-UNSPECIFIED 11/11/2009  . BENIGN PROSTATIC HYPERTROPHY, HX OF 12/05/2009  . Atrial flutter ([place]) 12/05/2009  . AORTIC STENOSIS, MODERATE 12/05/2009  . Knee pain, right 12/28/2010  . Hypoxia 05/05/2011  . Heart murmur   . SLEEP APNEA 04/06/2010    uses cpap  . DM 12/05/2009  . Superficial thrombophlebitis of left leg 09/07/2011  . Atypical chest pain 11/08/2011  . 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 Hyperkalemia 04/18/2012  . Edema 04/18/2012  . Debility 04/18/2012  . Toe pain, right 07/10/2012  . Gout 07/10/2012  . OSA (obstructive sleep apnea) 07/13/2014     Past Surgical History  Procedure Laterality Date  . Bilat foot surgery removed part of 5th metatarsal to corect curvature of toes    . Cardiac catheterization  10/16/2009    nonischemic cardiomyopathy  . Cardioversion N/A 03/25/2015    Procedure: CARDIOVERSION;  Surgeon: Pixie Casino, MD;  Location: [facility] ENDOSCOPY;  Service: Cardiovascular;  Laterality: N/A;    Family History  Problem Relation Age of Onset  . Clotting disorder Mother   . Heart disease Mother     s/p MI  . Heart attack Mother   . Hypertension Mother   . Diabetes Mother   . Hyperlipidemia Mother   . Stroke Mother   . Cancer Father     ? lung  . Lung disease Father     smoker  . Diabetes Sister   . Hypertension Sister     smoker  . Leukemia Maternal Grandmother     ?  [name] Aneurysm Sister     brain  . Other Sister     clipped  . Seizures Sister     d/o w/aneurysm/ smoker    Social History   Social History  . Marital Status: Married    Spouse Name: N/A  . Number of Children: N/A  . Years of Education: N/A   Occupational History  . Not on file.   Social History Main Topics  . Smoking status: Former Smoker -- 1.00 packs/day for 10 years    Quit date: 06/07/1980  . Smokeless  tobacco: Never Used  . Alcohol Use: No  . Drug Use: No  . Sexual Activity: Not Currently   Other Topics Concern  . Not on file   Social History Narrative    Current Outpatient Prescriptions on File Prior to Visit  Medication Sig Dispense Refill  . albuterol (PROVENTIL HFA;VENTOLIN HFA) 108 (90 BASE) MCG/ACT inhaler Inhale 2 puffs into the lungs every 6 (six) hours as needed for wheezing. 3 Inhaler 3  . allopurinol (ZYLOPRIM) 300 MG tablet Take 1 tablet (300 mg total) by mouth daily. 90 tablet 2  . aspirin EC 81 MG tablet Take 81 mg by mouth daily.    . benazepril (LOTENSIN) 40 MG tablet TAKE ONE TABLET BY MOUTH ONCE DAILY 90 tablet 1  . calcium carbonate (TUMS - DOSED IN MG ELEMENTAL CALCIUM) 500 MG chewable tablet  Chew 1 tablet by mouth daily as needed. For heartburn    . cetirizine (ZYRTEC) 10 MG tablet Take 1 tablet (10 mg total) by mouth daily. 30 tablet 11  . Clobetasol Prop Emollient Base (CLOBETASOL PROPIONATE E) 0.05 % emollient cream Apply 1 application topically 2 (two) times daily. (Patient taking differently: Apply 1 application topically 2 (two) times daily as needed (insect bite). ) 60 g 0  . COLCRYS 0.6 MG tablet TAKE ONE TABLET BY MOUTH EVERY TWO HOURS FOR FOUR DOSES OR PAIN RELIEF TODAY. THEN TAKE ONE TABLET BY MOUTH ONCE DAILY (Patient taking differently: TAKE ONE TABLET BY MOUTH DAILY AS NEEDED FOR GOUT FLARE UPS.) 35 tablet 0  . fish oil-omega-3 fatty acids 1000 MG capsule Take 1 g by mouth daily.     . furosemide (LASIX) 40 MG tablet TAKE ONE TABLET BY MOUTH ONCE DAILY 90 tablet 1  . glyBURIDE (DIABETA) 5 MG tablet Take 1 tablet (5 mg total) by mouth daily with breakfast. 90 tablet 3  . HYDROcodone-acetaminophen (NORCO) 10-325 MG per tablet Take 1 tablet by mouth every 8 (eight) hours as needed for moderate pain or severe pain. 90 tablet 0  . KLOR-CON M20 20 MEQ tablet TAKE ONE-HALF TO ONE TABLET BY MOUTH ONCE DAILY AS DIRECTED 90 tablet 1  . LORazepam (ATIVAN) 1 MG tablet Take 1 tablet (1 mg total) by mouth 2 (two) times daily as needed for anxiety. 60 tablet 2  . metFORMIN (GLUCOPHAGE) 1000 MG tablet TAKE ONE TABLET BY MOUTH TWICE DAILY WITH MEALS 60 tablet 11  . metoprolol (LOPRESSOR) 100 MG tablet TAKE ONE TABLET BY MOUTH TWICE DAILY 180 tablet 1  . Multiple Vitamins-Minerals (MULTIVITAMIN PO) Take 1 tablet by mouth daily.    . mupirocin ointment (BACTROBAN) 2 % Place 1 application into the nose 2 (two) times daily. 30 g 1  . naproxen (NAPROSYN) 375 MG tablet TAKE ONE TABLET BY MOUTH ONCE DAILY AS NEEDED FOR PAIN 30 tablet 4  . pravastatin (PRAVACHOL) 40 MG tablet TAKE ONE TABLET BY MOUTH ONCE DAILY 30 tablet 6  . temazepam (RESTORIL) 30 MG capsule TAKE ONE CAPSULE BY MOUTH AT BEDTIME  AS NEEDED FOR  SLEEP  OR  ANXIETY 30 capsule 3  . Testosterone 10 MG/ACT (2%) GEL Apply 1 application topically daily. Use as directed    . TIKOSYN 500 MCG capsule TAKE 1 CAPSULE BY MOUTH EVERY 12 HOURS 60 capsule 1  . warfarin (COUMADIN) 4 MG tablet TAKE ONE TABLET BY MOUTH ONCE DAILY OR AS DIRECTED 90 tablet 1   No current facility-administered medications on file prior to visit.    Allergies  Allergen  Reactions  . Sulfonamide Derivatives Other (See Comments)    unknown reaction.  Patient states had to go to the ER.   [name] Zolpidem     Excessive, prolonged sedation    Review of Systems   Constitutional: Negative.  HENT: Negative.  Eyes: Negative.  Respiratory: Negative.  Cardiovascular: Shortness of breath, consistent with baseline  Gastrointestinal: Negative.  Genitourinary: Negative Musculoskeletal: Bilateral knee pain Skin: Residual scar tissue bilaterally on shins from lesion removal  Neurological: Negative.  Endo/Heme/Allergies: Negative.  Psychiatric/Behavioral: Negative  Objective  BP 135/78 mmHg  Pulse 73  Temp(Src) 97.7 F (36.5 C) (Oral)  Ht 5\' 11"  (1.803 m)  Wt 232 lb 2 oz (105.291 kg)  BMI 32.39 kg/m2  SpO2 100%  Physical Exam  GEN: Well nourished, well developed, male in no acute distress  HEENT: normal for age  Neck: no JVD, no carotid bruit, no masses Cardiac: RRR; diastolic murmur, no rubs, or gallops Respiratory: clear to auscultation bilaterally, normal work of breathing.  GI: soft, nontender, nondistended, + BS MS: no deformity or atrophy; no edema; distal pulses are 2+ in all 4 extremities  Skin: warm and dry, no rash Neuro: Strength and sensation are intact Psych: euthymic mood, full affect   Assessment & Plan  Congestive Heart Failure -Improvement under cardiologist's care -Feeling better since cardioversion on 03/25/15 -No new concern at this point  Hypoxia -Continues to experience shortness of breath with  exertion -Consistent with baseline  Atrial Fibrillation -Currently taking tikosyn -Cardioverted on 03/25/15  Joint Pain -Has not gotten worse -Controlled with OTC, elevation, ice -Not interested in knee replacement  Obstructive Sleep Apnea -Compliant with CPAP, continue to use  Hyperlipidemia -Tolerating statin -Encouraged heart healthy diet, avoid trans fats, minimize simple carbs and saturated fats.  -Increase exercise as tolerated  Essential hypertension -Well controlled, no changes to meds. -Encouraged heart healthy diet and exercise as tolerated.   Diabetes Mellitus Type 2, controlled -HgA1c ordered -Minimize simple carbs, increase exercise as tolerated -Foot exam completed -Recommended yearly eye exam -Continue current meds  Obesity -Encouraged decrease po intake and increase exercise as tolerated.  -Avoid trans fats, eat small, frequent meals every 4-5 hours with lean proteins, complex carbs and healthy fats. Minimize simple carbs, GMO foods.  Pigmented Skin Lesion -Resolved since removal  Gout -Uric acid level to assess current status  Preventative -Discussed colonoscopy, no FH, will do cologuard -Flu shot -PNA at next appointment  Lab Work Ordered -CBC, CMP, Lipid panel, HgBA1c, uric acid level  Follow up in 3 months

## 2015-04-14 NOTE — Progress Notes (Signed)
Pre visit review using our clinic review tool, if applicable. No additional management support is needed unless otherwise documented below in the visit note. 

## 2015-04-15 ENCOUNTER — Encounter: Payer: Self-pay | Admitting: Family Medicine

## 2015-04-15 LAB — LIPID PANEL
CHOLESTEROL: 151 mg/dL (ref 0–200)
HDL: 31.5 mg/dL — AB (ref >39.00)
LDL Cholesterol: 83 mg/dL (ref 0–99)
NonHDL: 119.86
TRIGLYCERIDES: 183 mg/dL — AB (ref 0.0–149.0)
Total CHOL/HDL Ratio: 5
VLDL: 36.6 mg/dL (ref 0.0–40.0)

## 2015-04-15 LAB — CBC
HEMATOCRIT: 46.8 % (ref 39.0–52.0)
Hemoglobin: 15 g/dL (ref 13.0–17.0)
MCHC: 32 g/dL (ref 30.0–36.0)
MCV: 78.9 fl (ref 78.0–100.0)
PLATELETS: 232 10*3/uL (ref 150.0–400.0)
RBC: 5.93 Mil/uL — AB (ref 4.22–5.81)
RDW: 18.4 % — ABNORMAL HIGH (ref 11.5–15.5)
WBC: 4.7 10*3/uL (ref 4.0–10.5)

## 2015-04-15 LAB — COMPREHENSIVE METABOLIC PANEL
ALBUMIN: 4.3 g/dL (ref 3.5–5.2)
ALK PHOS: 41 U/L (ref 39–117)
ALT: 15 U/L (ref 0–53)
AST: 16 U/L (ref 0–37)
BUN: 25 mg/dL — ABNORMAL HIGH (ref 6–23)
CHLORIDE: 106 meq/L (ref 96–112)
CO2: 27 mEq/L (ref 19–32)
Calcium: 9.8 mg/dL (ref 8.4–10.5)
Creatinine, Ser: 1.26 mg/dL (ref 0.40–1.50)
GFR: 74.51 mL/min (ref >60.00)
Glucose, Bld: 144 mg/dL — ABNORMAL HIGH (ref 70–99)
POTASSIUM: 4.2 meq/L (ref 3.5–5.1)
Sodium: 142 mEq/L (ref 135–145)
TOTAL PROTEIN: 7.4 g/dL (ref 6.0–8.3)
Total Bilirubin: 0.5 mg/dL (ref 0.2–1.2)

## 2015-04-15 LAB — HEMOGLOBIN A1C: Hgb A1c MFr Bld: 6.6 % — ABNORMAL HIGH (ref 4.6–6.5)

## 2015-04-15 LAB — URIC ACID: Uric Acid, Serum: 6 mg/dL (ref 4.0–7.8)

## 2015-04-15 LAB — MICROALBUMIN / CREATININE URINE RATIO
Creatinine,U: 181.1 mg/dL
MICROALB/CREAT RATIO: 0.6 mg/g (ref 0.0–30.0)
Microalb, Ur: 1.1 mg/dL (ref 0.0–1.9)

## 2015-04-15 LAB — TSH: TSH: 0.82 u[IU]/mL (ref 0.35–4.50)

## 2015-04-18 ENCOUNTER — Telehealth: Payer: Self-pay

## 2015-04-18 DIAGNOSIS — R790 Abnormal level of blood mineral: Secondary | ICD-10-CM

## 2015-04-18 DIAGNOSIS — Z79899 Other long term (current) drug therapy: Secondary | ICD-10-CM

## 2015-04-18 MED ORDER — MAGNESIUM OXIDE 400 MG PO TABS: 400.0000 mg | ORAL_TABLET | Freq: Every day | ORAL | Status: DC

## 2015-04-18 NOTE — Telephone Encounter (Signed)
Medication refilled and lab ordered. Patient notified through mychart.

## 2015-04-18 NOTE — Telephone Encounter (Signed)
-----   Message from Lonn Georgia, PA-C sent at 04/18/2015 12:12 PM EST ----- Please let him know that his magnesium was a little low. He needs to start Mag-Ox 400 mg daily. Recheck in 1 month, with Dr Charlett Blake is fine or with [place].

## 2015-04-20 NOTE — Assessment & Plan Note (Signed)
Tolerating statin, encouraged heart healthy diet, avoid trans fats, minimize simple carbs and saturated fats. Increase exercise as tolerated 

## 2015-04-20 NOTE — Assessment & Plan Note (Signed)
Encouraged DASH diet, decrease po intake and increase exercise as tolerated. Needs 7-8 hours of sleep nightly. Avoid trans fats, eat small, frequent meals every 4-5 hours with lean proteins, complex carbs and healthy fats. Minimize simple carbs 

## 2015-04-20 NOTE — Assessment & Plan Note (Signed)
Has undergone 2 cardioversions and feels much better since then.

## 2015-04-20 NOTE — Assessment & Plan Note (Signed)
asymptomatic

## 2015-04-20 NOTE — Assessment & Plan Note (Signed)
Well controlled, no changes to meds. Encouraged heart healthy diet such as the DASH diet and exercise as tolerated.  °

## 2015-04-20 NOTE — Progress Notes (Signed)
Subjective:    Patient ID: [name], male    DOB: 03-06-1951, 64 y.o.   MRN: [id number]  Chief Complaint  Patient presents with  . Follow-up     3 month    HPI Patient is in today for follow-up. He has had to go to cardioversions since last seen and he notes his fatigue and shortness of breath are improved since that was completed. He's had no other recent illness. He denies any other acute complaints. Denies SOB/HA/congestion/fevers/GI or GU c/o. Taking meds as prescribed  Past Medical History  Diagnosis Date  . Hypertension   . Hyperlipidemia   . Atrial fibrillation ([place])   . UNSPECIFIED ANEMIA 01/05/2010  . TESTICULAR HYPOFUNCTION 01/05/2010  . PULMONARY FUNCTION TESTS, ABNORMAL 02/02/2010  . PERIPHERAL NEUROPATHY 01/05/2010  . OBESITY NOS 01/25/2007  . NEUROMA 01/05/2010  . INSOMNIA, HX OF 01/25/2007  . HYPERTENSION 01/25/2007  . FATIGUE, CHRONIC 11/11/2009  . ERECTILE DYSFUNCTION 01/25/2007  . DYSPNEA 12/19/2009  . CHF 12/05/2009  . CHEST PAIN-UNSPECIFIED 11/11/2009  . BENIGN PROSTATIC HYPERTROPHY, HX OF 12/05/2009  . Atrial flutter ([place]) 12/05/2009  . AORTIC STENOSIS, MODERATE 12/05/2009  . Knee pain, right 12/28/2010  . Hypoxia 05/05/2011  . Heart murmur   . SLEEP APNEA 04/06/2010    uses cpap  . DM 12/05/2009  . Superficial thrombophlebitis of left leg 09/07/2011  . Atypical chest pain 11/08/2011  . 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 Hyperkalemia 04/18/2012  . Edema 04/18/2012  . Debility 04/18/2012  . Toe pain, right 07/10/2012  . Gout 07/10/2012  . OSA (obstructive sleep apnea) 07/13/2014    Past Surgical History  Procedure Laterality Date  . Bilat foot surgery removed part of 5th metatarsal to corect curvature of toes    . Cardiac catheterization  10/16/2009    nonischemic cardiomyopathy  . Cardioversion N/A 03/25/2015    Procedure: CARDIOVERSION;  Surgeon: Pixie Casino, MD;  Location: [facility] ENDOSCOPY;  Service: Cardiovascular;  Laterality: N/A;    Family History  Problem Relation  Age of Onset  . Clotting disorder Mother   . Heart disease Mother     s/p MI  . Heart attack Mother   . Hypertension Mother   . Diabetes Mother   . Hyperlipidemia Mother   . Stroke Mother   . Cancer Father     ? lung  . Lung disease Father     smoker  . Diabetes Sister   . Hypertension Sister     smoker  . Leukemia Maternal Grandmother     ?  [name] Aneurysm Sister     brain  . Other Sister     clipped  . Seizures Sister     d/o w/aneurysm/ smoker    Social History   Social History  . Marital Status: Married    Spouse Name: N/A  . Number of Children: N/A  . Years of Education: N/A   Occupational History  . Not on file.   Social History Main Topics  . Smoking status: Former Smoker -- 1.00 packs/day for 10 years    Quit date: 06/07/1980  . Smokeless tobacco: Never Used  . Alcohol Use: No  . Drug Use: No  . Sexual Activity: Not Currently   Other Topics Concern  . Not on file   Social History Narrative    Outpatient Prescriptions Prior to Visit  Medication Sig Dispense Refill  . albuterol (PROVENTIL HFA;VENTOLIN HFA) 108 (90 BASE) MCG/ACT inhaler Inhale 2 puffs into the lungs  every 6 (six) hours as needed for wheezing. 3 Inhaler 3  . aspirin EC 81 MG tablet Take 81 mg by mouth daily.    . benazepril (LOTENSIN) 40 MG tablet TAKE ONE TABLET BY MOUTH ONCE DAILY 90 tablet 1  . calcium carbonate (TUMS - DOSED IN MG ELEMENTAL CALCIUM) 500 MG chewable tablet Chew 1 tablet by mouth daily as needed. For heartburn    . cetirizine (ZYRTEC) 10 MG tablet Take 1 tablet (10 mg total) by mouth daily. 30 tablet 11  . Clobetasol Prop Emollient Base (CLOBETASOL PROPIONATE E) 0.05 % emollient cream Apply 1 application topically 2 (two) times daily. (Patient taking differently: Apply 1 application topically 2 (two) times daily as needed (insect bite). ) 60 g 0  . COLCRYS 0.6 MG tablet TAKE ONE TABLET BY MOUTH EVERY TWO HOURS FOR FOUR DOSES OR PAIN RELIEF TODAY. THEN TAKE ONE TABLET BY  MOUTH ONCE DAILY (Patient taking differently: TAKE ONE TABLET BY MOUTH DAILY AS NEEDED FOR GOUT FLARE UPS.) 35 tablet 0  . fish oil-omega-3 fatty acids 1000 MG capsule Take 1 g by mouth daily.     . furosemide (LASIX) 40 MG tablet TAKE ONE TABLET BY MOUTH ONCE DAILY 90 tablet 1  . glyBURIDE (DIABETA) 5 MG tablet Take 1 tablet (5 mg total) by mouth daily with breakfast. 90 tablet 3  . KLOR-CON M20 20 MEQ tablet TAKE ONE-HALF TO ONE TABLET BY MOUTH ONCE DAILY AS DIRECTED 90 tablet 1  . metFORMIN (GLUCOPHAGE) 1000 MG tablet TAKE ONE TABLET BY MOUTH TWICE DAILY WITH MEALS 60 tablet 11  . metoprolol (LOPRESSOR) 100 MG tablet TAKE ONE TABLET BY MOUTH TWICE DAILY 180 tablet 1  . Multiple Vitamins-Minerals (MULTIVITAMIN PO) Take 1 tablet by mouth daily.    . mupirocin ointment (BACTROBAN) 2 % Place 1 application into the nose 2 (two) times daily. 30 g 1  . naproxen (NAPROSYN) 375 MG tablet TAKE ONE TABLET BY MOUTH ONCE DAILY AS NEEDED FOR PAIN 30 tablet 4  . pravastatin (PRAVACHOL) 40 MG tablet TAKE ONE TABLET BY MOUTH ONCE DAILY 30 tablet 6  . Testosterone 10 MG/ACT (2%) GEL Apply 1 application topically daily. Use as directed    . TIKOSYN 500 MCG capsule TAKE 1 CAPSULE BY MOUTH EVERY 12 HOURS 60 capsule 1  . warfarin (COUMADIN) 4 MG tablet TAKE ONE TABLET BY MOUTH ONCE DAILY OR AS DIRECTED 90 tablet 1  . allopurinol (ZYLOPRIM) 300 MG tablet Take 1 tablet (300 mg total) by mouth daily. 90 tablet 2  . HYDROcodone-acetaminophen (NORCO) 10-325 MG per tablet Take 1 tablet by mouth every 8 (eight) hours as needed for moderate pain or severe pain. 90 tablet 0  . LORazepam (ATIVAN) 1 MG tablet Take 1 tablet (1 mg total) by mouth 2 (two) times daily as needed for anxiety. 60 tablet 2  . temazepam (RESTORIL) 30 MG capsule TAKE ONE CAPSULE BY MOUTH AT BEDTIME AS NEEDED FOR  SLEEP  OR  ANXIETY 30 capsule 3   No facility-administered medications prior to visit.    Allergies  Allergen Reactions  . Sulfonamide  Derivatives Other (See Comments)    unknown reaction.  Patient states had to go to the ER.   [name] Zolpidem     Excessive, prolonged sedation    Review of Systems  Constitutional: Negative for fever and malaise/fatigue.  HENT: Negative for congestion.   Eyes: Negative for discharge.  Respiratory: Negative for shortness of breath.   Cardiovascular: Positive for palpitations.  Negative for chest pain and leg swelling.  Gastrointestinal: Negative for nausea and abdominal pain.  Genitourinary: Negative for dysuria.  Musculoskeletal: Negative for falls.  Skin: Negative for rash.  Neurological: Negative for loss of consciousness and headaches.  Endo/Heme/Allergies: Negative for environmental allergies.  Psychiatric/Behavioral: Negative for depression. The patient is not nervous/anxious.        Objective:    Physical Exam  Constitutional: He is oriented to person, place, and time. He appears well-developed and well-nourished. No distress.  HENT:  Head: Normocephalic and atraumatic.  Nose: Nose normal.  Eyes: Right eye exhibits no discharge. Left eye exhibits no discharge.  Neck: Normal range of motion. Neck supple.  Cardiovascular: Normal rate.   Pulmonary/Chest: Effort normal and breath sounds normal.  Abdominal: Soft. Bowel sounds are normal. There is no tenderness.  Musculoskeletal: He exhibits no edema.  Neurological: He is alert and oriented to person, place, and time.  Skin: Skin is warm and dry.  Psychiatric: He has a normal mood and affect.  Nursing note and vitals reviewed.   BP 135/78 mmHg  Pulse 73  Temp(Src) 97.7 F (36.5 C) (Oral)  Ht 5\' 11"  (1.803 m)  Wt 232 lb 2 oz (105.291 kg)  BMI 32.39 kg/m2  SpO2 100% Wt Readings from Last 3 Encounters:  04/14/15 232 lb 2 oz (105.291 kg)  04/14/15 230 lb 12.8 oz (104.69 kg)  03/24/15 229 lb 12.8 oz (104.237 kg)     Lab Results  Component Value Date   WBC 4.7 04/14/2015   HGB 15.0 04/14/2015   HCT 46.8 04/14/2015    PLT 232.0 04/14/2015   GLUCOSE 144* 04/14/2015   CHOL 151 04/14/2015   TRIG 183.0* 04/14/2015   HDL 31.50* 04/14/2015   LDLCALC 83 04/14/2015   ALT 15 04/14/2015   AST 16 04/14/2015   NA 142 04/14/2015   K 4.2 04/14/2015   CL 106 04/14/2015   CREATININE 1.26 04/14/2015   BUN 25* 04/14/2015   CO2 27 04/14/2015   TSH 0.82 04/14/2015   PSA 4.26* 12/29/2009   INR 3.7 04/14/2015   HGBA1C 6.6* 04/14/2015   MICROALBUR 1.1 04/14/2015    Lab Results  Component Value Date   TSH 0.82 04/14/2015   Lab Results  Component Value Date   WBC 4.7 04/14/2015   HGB 15.0 04/14/2015   HCT 46.8 04/14/2015   MCV 78.9 04/14/2015   PLT 232.0 04/14/2015   Lab Results  Component Value Date   NA 142 04/14/2015   K 4.2 04/14/2015   CO2 27 04/14/2015   GLUCOSE 144* 04/14/2015   BUN 25* 04/14/2015   CREATININE 1.26 04/14/2015   BILITOT 0.5 04/14/2015   ALKPHOS 41 04/14/2015   AST 16 04/14/2015   ALT 15 04/14/2015   PROT 7.4 04/14/2015   ALBUMIN 4.3 04/14/2015   CALCIUM 9.8 04/14/2015   GFR 74.51 04/14/2015   Lab Results  Component Value Date   CHOL 151 04/14/2015   Lab Results  Component Value Date   HDL 31.50* 04/14/2015   Lab Results  Component Value Date   LDLCALC 83 04/14/2015   Lab Results  Component Value Date   TRIG 183.0* 04/14/2015   Lab Results  Component Value Date   CHOLHDL 5 04/14/2015   Lab Results  Component Value Date   HGBA1C 6.6* 04/14/2015       Assessment & Plan:   Problem List Items Addressed This Visit    Anemia   Relevant Medications   temazepam (RESTORIL) 30  MG capsule   LORazepam (ATIVAN) 1 MG tablet   HYDROcodone-acetaminophen (NORCO) 10-325 MG tablet   allopurinol (ZYLOPRIM) 300 MG tablet   Other Relevant Orders   TSH (Completed)   CBC (Completed)   Hemoglobin A1c (Completed)   Comprehensive metabolic panel (Completed)   Lipid panel (Completed)   Uric acid (Completed)   Microalbumin / creatinine urine ratio (Completed)    Ambulatory referral to Ophthalmology   Atrial fibrillation, converted to SR after 2nd dose of Tikosyn.    Has undergone 2 cardioversions and feels much better since then.      Diabetes mellitus type 2 in obese (HCC)   Relevant Medications   temazepam (RESTORIL) 30 MG capsule   LORazepam (ATIVAN) 1 MG tablet   HYDROcodone-acetaminophen (NORCO) 10-325 MG tablet   allopurinol (ZYLOPRIM) 300 MG tablet   Other Relevant Orders   TSH (Completed)   CBC (Completed)   Hemoglobin A1c (Completed)   Comprehensive metabolic panel (Completed)   Lipid panel (Completed)   Uric acid (Completed)   Microalbumin / creatinine urine ratio (Completed)   Ambulatory referral to Ophthalmology   Essential hypertension    Well controlled, no changes to meds. Encouraged heart healthy diet such as the DASH diet and exercise as tolerated.       Relevant Medications   temazepam (RESTORIL) 30 MG capsule   LORazepam (ATIVAN) 1 MG tablet   HYDROcodone-acetaminophen (NORCO) 10-325 MG tablet   allopurinol (ZYLOPRIM) 300 MG tablet   Other Relevant Orders   TSH (Completed)   CBC (Completed)   Hemoglobin A1c (Completed)   Comprehensive metabolic panel (Completed)   Lipid panel (Completed)   Uric acid (Completed)   Microalbumin / creatinine urine ratio (Completed)   Ambulatory referral to Ophthalmology   Gout    asymptomatic      Hyperkalemia   Relevant Medications   temazepam (RESTORIL) 30 MG capsule   LORazepam (ATIVAN) 1 MG tablet   HYDROcodone-acetaminophen (NORCO) 10-325 MG tablet   allopurinol (ZYLOPRIM) 300 MG tablet   Other Relevant Orders   TSH (Completed)   CBC (Completed)   Hemoglobin A1c (Completed)   Comprehensive metabolic panel (Completed)   Lipid panel (Completed)   Uric acid (Completed)   Microalbumin / creatinine urine ratio (Completed)   Ambulatory referral to Ophthalmology   Hyperlipidemia    Tolerating statin, encouraged heart healthy diet, avoid trans fats, minimize simple carbs  and saturated fats. Increase exercise as tolerated      Relevant Medications   temazepam (RESTORIL) 30 MG capsule   LORazepam (ATIVAN) 1 MG tablet   HYDROcodone-acetaminophen (NORCO) 10-325 MG tablet   allopurinol (ZYLOPRIM) 300 MG tablet   Other Relevant Orders   TSH (Completed)   CBC (Completed)   Hemoglobin A1c (Completed)   Comprehensive metabolic panel (Completed)   Lipid panel (Completed)   Uric acid (Completed)   Microalbumin / creatinine urine ratio (Completed)   Ambulatory referral to Ophthalmology   Long term (current) use of anticoagulants   Relevant Medications   temazepam (RESTORIL) 30 MG capsule   LORazepam (ATIVAN) 1 MG tablet   HYDROcodone-acetaminophen (NORCO) 10-325 MG tablet   allopurinol (ZYLOPRIM) 300 MG tablet   Other Relevant Orders   TSH (Completed)   CBC (Completed)   Hemoglobin A1c (Completed)   Comprehensive metabolic panel (Completed)   Lipid panel (Completed)   Uric acid (Completed)   Microalbumin / creatinine urine ratio (Completed)   Ambulatory referral to Ophthalmology   Obesity    Encouraged DASH diet, decrease po  intake and increase exercise as tolerated. Needs 7-8 hours of sleep nightly. Avoid trans fats, eat small, frequent meals every 4-5 hours with lean proteins, complex carbs and healthy fats. Minimize simple carbs       Other Visit Diagnoses    Insomnia    -  Primary    Relevant Medications    temazepam (RESTORIL) 30 MG capsule    LORazepam (ATIVAN) 1 MG tablet    HYDROcodone-acetaminophen (NORCO) 10-325 MG tablet    allopurinol (ZYLOPRIM) 300 MG tablet    Other Relevant Orders    TSH (Completed)    CBC (Completed)    Hemoglobin A1c (Completed)    Comprehensive metabolic panel (Completed)    Lipid panel (Completed)    Uric acid (Completed)    Microalbumin / creatinine urine ratio (Completed)    Ambulatory referral to Ophthalmology    Anxiety state        Relevant Medications    temazepam (RESTORIL) 30 MG capsule     LORazepam (ATIVAN) 1 MG tablet    HYDROcodone-acetaminophen (NORCO) 10-325 MG tablet    allopurinol (ZYLOPRIM) 300 MG tablet    Other Relevant Orders    TSH (Completed)    CBC (Completed)    Hemoglobin A1c (Completed)    Comprehensive metabolic panel (Completed)    Lipid panel (Completed)    Uric acid (Completed)    Microalbumin / creatinine urine ratio (Completed)    Ambulatory referral to Ophthalmology    Knee pain, bilateral        Relevant Medications    temazepam (RESTORIL) 30 MG capsule    LORazepam (ATIVAN) 1 MG tablet    HYDROcodone-acetaminophen (NORCO) 10-325 MG tablet    allopurinol (ZYLOPRIM) 300 MG tablet    Other Relevant Orders    TSH (Completed)    CBC (Completed)    Hemoglobin A1c (Completed)    Comprehensive metabolic panel (Completed)    Lipid panel (Completed)    Uric acid (Completed)    Microalbumin / creatinine urine ratio (Completed)    Ambulatory referral to Ophthalmology    Encounter for immunization        Glaucoma        Relevant Medications    temazepam (RESTORIL) 30 MG capsule    LORazepam (ATIVAN) 1 MG tablet    HYDROcodone-acetaminophen (NORCO) 10-325 MG tablet    allopurinol (ZYLOPRIM) 300 MG tablet    Other Relevant Orders    TSH (Completed)    CBC (Completed)    Hemoglobin A1c (Completed)    Comprehensive metabolic panel (Completed)    Lipid panel (Completed)    Uric acid (Completed)    Microalbumin / creatinine urine ratio (Completed)    Ambulatory referral to Ophthalmology       I have changed Mr. Errington HYDROcodone-acetaminophen. I am also having him maintain his fish oil-omega-3 fatty acids, calcium carbonate, aspirin EC, Multiple Vitamins-Minerals (MULTIVITAMIN PO), COLCRYS, albuterol, naproxen, Testosterone, metFORMIN, mupirocin ointment, glyBURIDE, cetirizine, Clobetasol Prop Emollient Base, pravastatin, TIKOSYN, furosemide, metoprolol, KLOR-CON M20, warfarin, benazepril, temazepam, LORazepam, and allopurinol.  Meds ordered  this encounter  Medications  . temazepam (RESTORIL) 30 MG capsule    Sig: TAKE ONE CAPSULE BY MOUTH AT BEDTIME AS NEEDED FOR  SLEEP  OR  ANXIETY    Dispense:  30 capsule    Refill:  3  . LORazepam (ATIVAN) 1 MG tablet    Sig: Take 1 tablet (1 mg total) by mouth 2 (two) times daily as needed for anxiety.  Dispense:  60 tablet    Refill:  2  . HYDROcodone-acetaminophen (NORCO) 10-325 MG tablet    Sig: Take 1 tablet by mouth every 8 (eight) hours as needed for moderate pain or severe pain.    Dispense:  90 tablet    Refill:  0  . allopurinol (ZYLOPRIM) 300 MG tablet    Sig: Take 1 tablet (300 mg total) by mouth daily.    Dispense:  90 tablet    Refill:  2    D/C PREVIOUS SCRIPTS FOR THIS MEDICATION     Penni Homans, MD

## 2015-05-03 ENCOUNTER — Other Ambulatory Visit: Payer: Self-pay | Admitting: Cardiovascular Disease

## 2015-05-05 ENCOUNTER — Ambulatory Visit: Payer: Medicare Other | Admitting: Pharmacist Clinician (PhC)/ Clinical Pharmacy Specialist

## 2015-05-05 NOTE — Telephone Encounter (Signed)
Rx request sent to pharmacy.  

## 2015-05-07 ENCOUNTER — Ambulatory Visit (INDEPENDENT_AMBULATORY_CARE_PROVIDER_SITE_OTHER): Payer: Medicare Other | Admitting: Pharmacist Clinician (PhC)/ Clinical Pharmacy Specialist

## 2015-05-07 ENCOUNTER — Ambulatory Visit (INDEPENDENT_AMBULATORY_CARE_PROVIDER_SITE_OTHER): Payer: Medicare Other | Admitting: Cardiovascular Disease

## 2015-05-07 ENCOUNTER — Encounter: Payer: Self-pay | Admitting: Cardiovascular Disease

## 2015-05-07 VITALS — BP 130/76 | HR 73 | Resp 16 | Ht 71.0 in | Wt 231.5 lb

## 2015-05-07 DIAGNOSIS — I5032 Chronic diastolic (congestive) heart failure: Secondary | ICD-10-CM | POA: Diagnosis not present

## 2015-05-07 DIAGNOSIS — I480 Paroxysmal atrial fibrillation: Secondary | ICD-10-CM

## 2015-05-07 DIAGNOSIS — I10 Essential (primary) hypertension: Secondary | ICD-10-CM | POA: Diagnosis not present

## 2015-05-07 DIAGNOSIS — I351 Nonrheumatic aortic (valve) insufficiency: Secondary | ICD-10-CM

## 2015-05-07 DIAGNOSIS — E669 Obesity, unspecified: Secondary | ICD-10-CM

## 2015-05-07 DIAGNOSIS — Z7901 Long term (current) use of anticoagulants: Secondary | ICD-10-CM

## 2015-05-07 DIAGNOSIS — Z5181 Encounter for therapeutic drug level monitoring: Secondary | ICD-10-CM

## 2015-05-07 DIAGNOSIS — G4733 Obstructive sleep apnea (adult) (pediatric): Secondary | ICD-10-CM

## 2015-05-07 LAB — POCT INR: INR: 2.4

## 2015-05-07 NOTE — Progress Notes (Signed)
Patient ID: [name], male   DOB: [birth year], 64 y.o.   MRN: [id number]     Cardiology Office Note   Date:  05/07/2015   ID:  [name], [name] [birth year], MRN [id number]  PCP:  Penni Homans, MD  Cardiologist:   Sanda Klein, MD   Chief Complaint  Patient presents with  . Follow-up    pt states no light headedness or dizziness  . Chest Pain    pt states some   . Shortness of Breath    sometimes   . Edema    towards the evening left leg      History of Present Illness: [name] is a 64 y.o. male who presents for  Follow-up for diastolic heart failure, paroxysmal atrial fibrillation and atrial flutter, obstructive sleep apnea, aortic insufficiency, dyslipidemia in the setting of obesity and type 2 diabetes mellitus.   no recent problems have occurred. He continues to have functional class II exertional dyspnea but denies any edema, dizziness, syncope, palpitations (other than a very brief 1-2 beats) , focal neurological events or bleeding complications.  His echo showed that left ventricular systolic function is slightly depressed with an EF of 45-50%. He has moderate aortic insufficiency which appears to be secondary to mild dilatation of the aortic root. He has a long-standing history of severe hypertension which has not always been well treated. He also has long-standing history of obstructive sleep apnea which was also not treated until recently, but he is now 100% CPAP-compliant. Coronary angiography 2011 did not show evidence of significant stenoses. He presented with typical atrial flutter in 2011 and has recurrent paroxysmal atrial fibrillation with a good response to treatment with dofetilide. Additional problems include obesity, type 2 diabetes mellitus, gout and androgen deficiency.  He had a successful cardioversion on October 18 for persistent atrial fibrillation. He is on chronic treatment with Tikosyn and today his QTc interval was 431 ms. He is  compliant with sodium restriction and CPAP for sleep apnea. He was recently started on magnesium oxide which has helped with his constipation but does cause some degree of abdominal bloating. He inquires about the safety of testosterone supplements.  Past Medical History  Diagnosis Date  . Hypertension   . Hyperlipidemia   . Atrial fibrillation ([place])   . UNSPECIFIED ANEMIA 01/05/2010  . TESTICULAR HYPOFUNCTION 01/05/2010  . PULMONARY FUNCTION TESTS, ABNORMAL 02/02/2010  . PERIPHERAL NEUROPATHY 01/05/2010  . OBESITY NOS 01/25/2007  . NEUROMA 01/05/2010  . INSOMNIA, HX OF 01/25/2007  . HYPERTENSION 01/25/2007  . FATIGUE, CHRONIC 11/11/2009  . ERECTILE DYSFUNCTION 01/25/2007  . DYSPNEA 12/19/2009  . CHF 12/05/2009  . CHEST PAIN-UNSPECIFIED 11/11/2009  . BENIGN PROSTATIC HYPERTROPHY, HX OF 12/05/2009  . Atrial flutter ([place]) 12/05/2009  . AORTIC STENOSIS, MODERATE 12/05/2009  . Knee pain, right 12/28/2010  . Hypoxia 05/05/2011  . Heart murmur   . SLEEP APNEA 04/06/2010    uses cpap  . DM 12/05/2009  . Superficial thrombophlebitis of left leg 09/07/2011  . Atypical chest pain 11/08/2011  . 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 Hyperkalemia 04/18/2012  . Edema 04/18/2012  . Debility 04/18/2012  . Toe pain, right 07/10/2012  . Gout 07/10/2012  . OSA (obstructive sleep apnea) 07/13/2014    Past Surgical History  Procedure Laterality Date  . Bilat foot surgery removed part of 5th metatarsal to corect curvature of toes    . Cardiac catheterization  10/16/2009    nonischemic cardiomyopathy  . Cardioversion N/A 03/25/2015  Procedure: CARDIOVERSION;  Surgeon: Pixie Casino, MD;  Location: [facility] ENDOSCOPY;  Service: Cardiovascular;  Laterality: N/A;     Current Outpatient Prescriptions  Medication Sig Dispense Refill  . albuterol (PROVENTIL HFA;VENTOLIN HFA) 108 (90 BASE) MCG/ACT inhaler Inhale 2 puffs into the lungs every 6 (six) hours as needed for wheezing. 3 Inhaler 3  . allopurinol (ZYLOPRIM) 300 MG tablet Take 1 tablet  (300 mg total) by mouth daily. 90 tablet 2  . aspirin EC 81 MG tablet Take 81 mg by mouth daily.    . benazepril (LOTENSIN) 40 MG tablet TAKE ONE TABLET BY MOUTH ONCE DAILY 90 tablet 1  . calcium carbonate (TUMS - DOSED IN MG ELEMENTAL CALCIUM) 500 MG chewable tablet Chew 1 tablet by mouth daily as needed. For heartburn    . cetirizine (ZYRTEC) 10 MG tablet Take 1 tablet (10 mg total) by mouth daily. 30 tablet 11  . Clobetasol Prop Emollient Base (CLOBETASOL PROPIONATE E) 0.05 % emollient cream Apply 1 application topically 2 (two) times daily. (Patient taking differently: Apply 1 application topically 2 (two) times daily as needed (insect bite). ) 60 g 0  . COLCRYS 0.6 MG tablet TAKE ONE TABLET BY MOUTH EVERY TWO HOURS FOR FOUR DOSES OR PAIN RELIEF TODAY. THEN TAKE ONE TABLET BY MOUTH ONCE DAILY (Patient taking differently: TAKE ONE TABLET BY MOUTH DAILY AS NEEDED FOR GOUT FLARE UPS.) 35 tablet 0  . fish oil-omega-3 fatty acids 1000 MG capsule Take 1 g by mouth daily.     . furosemide (LASIX) 40 MG tablet TAKE ONE TABLET BY MOUTH ONCE DAILY 90 tablet 1  . glyBURIDE (DIABETA) 5 MG tablet Take 1 tablet (5 mg total) by mouth daily with breakfast. 90 tablet 3  . HYDROcodone-acetaminophen (NORCO) 10-325 MG tablet Take 1 tablet by mouth every 8 (eight) hours as needed for moderate pain or severe pain. 90 tablet 0  . KLOR-CON M20 20 MEQ tablet TAKE ONE-HALF TO ONE TABLET BY MOUTH ONCE DAILY AS DIRECTED 90 tablet 1  . LORazepam (ATIVAN) 1 MG tablet Take 1 tablet (1 mg total) by mouth 2 (two) times daily as needed for anxiety. 60 tablet 2  . magnesium oxide (MAG-OX) 400 MG tablet Take 1 tablet (400 mg total) by mouth daily. 30 tablet 11  . metFORMIN (GLUCOPHAGE) 1000 MG tablet TAKE ONE TABLET BY MOUTH TWICE DAILY WITH MEALS 60 tablet 11  . metoprolol (LOPRESSOR) 100 MG tablet TAKE ONE TABLET BY MOUTH TWICE DAILY 180 tablet 1  . Multiple Vitamins-Minerals (MULTIVITAMIN PO) Take 1 tablet by mouth daily.    .  mupirocin ointment (BACTROBAN) 2 % Place 1 application into the nose 2 (two) times daily. 30 g 1  . naproxen (NAPROSYN) 375 MG tablet TAKE ONE TABLET BY MOUTH ONCE DAILY AS NEEDED FOR PAIN 30 tablet 4  . pravastatin (PRAVACHOL) 40 MG tablet TAKE ONE TABLET BY MOUTH ONCE DAILY 30 tablet 6  . temazepam (RESTORIL) 30 MG capsule TAKE ONE CAPSULE BY MOUTH AT BEDTIME AS NEEDED FOR  SLEEP  OR  ANXIETY 30 capsule 3  . Testosterone 10 MG/ACT (2%) GEL Apply 1 application topically daily. Use as directed    . TIKOSYN 500 MCG capsule TAKE 1 CAPSULE BY MOUTH EVERY 12 HOURS 60 capsule 2  . warfarin (COUMADIN) 4 MG tablet TAKE ONE TABLET BY MOUTH ONCE DAILY OR AS DIRECTED 90 tablet 1   No current facility-administered medications for this visit.    Allergies:   Sulfonamide derivatives and  Zolpidem    Social History:  The patient  reports that he quit smoking about 34 years ago. He has never used smokeless tobacco. He reports that he does not drink alcohol or use illicit drugs.   Family History:  The patient's family history includes Aneurysm in his sister; Cancer in his father; Clotting disorder in his mother; Diabetes in his mother and sister; Heart attack in his mother; Heart disease in his mother; Hyperlipidemia in his mother; Hypertension in his mother and sister; Leukemia in his maternal grandmother; Lung disease in his father; Other in his sister; Seizures in his sister; Stroke in his mother.    ROS:  Please see the history of present illness.    Otherwise, review of systems positive for none.   All other systems are reviewed and negative.    PHYSICAL EXAM: VS:  BP 130/76 mmHg  Pulse 73  Resp 16  Ht 5\' 11"  (1.803 m)  Wt 231 lb 8 oz (105.008 kg)  BMI 32.30 kg/m2 , BMI Body mass index is 32.3 kg/(m^2).  General: Alert, oriented x3, no distress Head: no evidence of trauma, PERRL, EOMI, no exophtalmos or lid lag, no myxedema, no xanthelasma; normal ears, nose and oropharynx Neck: normal jugular  venous pulsations and no hepatojugular reflux; brisk carotid pulses without delay and no carotid bruits Chest: clear to auscultation, no signs of consolidation by percussion or palpation, normal fremitus, symmetrical and full respiratory excursions Cardiovascular: normal position and quality of the apical impulse, regular rhythm, normal first and second heart sounds,  Grade 2-3/6 decrescendo murmur heard best at the left upper sternal border but also at the right lower sternal border, no rubs or gallops Abdomen: no tenderness or distention, no masses by palpation, no abnormal pulsatility or arterial bruits, normal bowel sounds, no hepatosplenomegaly Extremities: no clubbing, cyanosis or edema; 2+ radial, ulnar and brachial pulses bilaterally; 2+ right femoral, posterior tibial and dorsalis pedis pulses; 2+ left femoral, posterior tibial and dorsalis pedis pulses; no subclavian or femoral bruits Neurological: grossly nonfocal Psych: euthymic mood, full affect   EKG:  EKG is ordered today. The ekg ordered today demonstrates  Normal sinus rhythm , nonspecific ST-T changes, QRS 92 ms, QTC 431 ms   Recent Labs: 04/14/2015: ALT 15; BUN 25*; Creatinine, Ser 1.26; Hemoglobin 15.0; Magnesium 1.7; Platelets 232.0; Potassium 4.2; Sodium 142; TSH 0.82    Lipid Panel    Component Value Date/Time   CHOL 151 04/14/2015 1526   TRIG 183.0* 04/14/2015 1526   HDL 31.50* 04/14/2015 1526   CHOLHDL 5 04/14/2015 1526   VLDL 36.6 04/14/2015 1526   LDLCALC 83 04/14/2015 1526      Wt Readings from Last 3 Encounters:  05/07/15 231 lb 8 oz (105.008 kg)  04/14/15 232 lb 2 oz (105.291 kg)  04/14/15 230 lb 12.8 oz (104.69 kg)     ASSESSMENT AND PLAN:  Chronic diastolic congestive heart failure  No recent episode of acute decompensation or need for increased diuretic therapy. He is reminded about the importance of sodium restriction, daily weight monitoring.   Atrial fibrillation He has only very brief  palpitations and only one breakthrough episode ofno sustained document persistent atrial fibrillation since we started treatment with Tikosyn. He tolerates AF poorly, probably due to significant diastolic dysfunction. He had convincing evidence of tachycardia-related cardiomyopathy in 2011.  He is at relatively high risk of embolic events and should continue warfarin anticoagulation without interruption.  Reviewed the risk of drug interactions with dofetilide and with warfarin.  Moderate aortic insufficiency  Unlikely a cause for congestive heart failure. Hypertension is likely the cause of his dilated aortic root, in turn the cause for his aortic valve insufficiency. The most important intervention is careful treatment of his high blood pressure. Repeat echo in 2017.  SLEEP APNEA  He again reports 100% compliance with CPAP therapy. No progress with weight loss. Repeated the recommendation for weight loss and we discussed calorie restriction and physical exercise. He cannot exercise a lot because of severe knee arthritis. In fact right knee replacement has been recommended.    Current medicines are reviewed at length with the patient today.  The patient does not have concerns regarding medicines.  The following changes have been made:  no change  Labs/ tests ordered today include:  Orders Placed This Encounter  Procedures  . EKG 12-Lead    Patient Instructions  Dr Sallyanne Kuster recommends that you schedule a follow-up appointment in 6 months. You will receive a reminder letter in the mail two months in advance. If you don't receive a letter, please call our office to schedule the follow-up appointment.  If you need a refill on your cardiac medications before your next appointment, please call your pharmacy.     Mikael Spray, MD  05/07/2015 11:33 AM    Sanda Klein, MD, [facility] [phone] office [phone] pager

## 2015-05-07 NOTE — Patient Instructions (Signed)
Dr Croitoru recommends that you schedule a follow-up appointment in 6 months. You will receive a reminder letter in the mail two months in advance. If you don't receive a letter, please call our office to schedule the follow-up appointment.  If you need a refill on your cardiac medications before your next appointment, please call your pharmacy. 

## 2015-05-14 DIAGNOSIS — E291 Testicular hypofunction: Secondary | ICD-10-CM | POA: Diagnosis not present

## 2015-05-14 DIAGNOSIS — Z79899 Other long term (current) drug therapy: Secondary | ICD-10-CM | POA: Diagnosis not present

## 2015-05-15 LAB — MAGNESIUM: MAGNESIUM: 1.6 mg/dL (ref 1.5–2.5)

## 2015-05-21 DIAGNOSIS — N138 Other obstructive and reflux uropathy: Secondary | ICD-10-CM | POA: Diagnosis not present

## 2015-05-21 DIAGNOSIS — E291 Testicular hypofunction: Secondary | ICD-10-CM | POA: Diagnosis not present

## 2015-05-21 DIAGNOSIS — N401 Enlarged prostate with lower urinary tract symptoms: Secondary | ICD-10-CM | POA: Diagnosis not present

## 2015-05-21 DIAGNOSIS — N5201 Erectile dysfunction due to arterial insufficiency: Secondary | ICD-10-CM | POA: Diagnosis not present

## 2015-05-21 DIAGNOSIS — R3912 Poor urinary stream: Secondary | ICD-10-CM | POA: Diagnosis not present

## 2015-06-04 ENCOUNTER — Ambulatory Visit (INDEPENDENT_AMBULATORY_CARE_PROVIDER_SITE_OTHER): Payer: Medicare Other | Admitting: Pharmacist Clinician (PhC)/ Clinical Pharmacy Specialist

## 2015-06-04 DIAGNOSIS — Z7901 Long term (current) use of anticoagulants: Secondary | ICD-10-CM

## 2015-06-04 DIAGNOSIS — I4891 Unspecified atrial fibrillation: Secondary | ICD-10-CM

## 2015-06-04 DIAGNOSIS — I480 Paroxysmal atrial fibrillation: Secondary | ICD-10-CM | POA: Diagnosis not present

## 2015-06-04 LAB — POCT INR: INR: 2.8

## 2015-06-17 DIAGNOSIS — L300 Nummular dermatitis: Secondary | ICD-10-CM | POA: Diagnosis not present

## 2015-07-15 ENCOUNTER — Encounter: Payer: Self-pay | Admitting: Family Medicine

## 2015-07-15 ENCOUNTER — Ambulatory Visit (INDEPENDENT_AMBULATORY_CARE_PROVIDER_SITE_OTHER): Payer: Medicare Other | Admitting: Family Medicine

## 2015-07-15 ENCOUNTER — Ambulatory Visit (INDEPENDENT_AMBULATORY_CARE_PROVIDER_SITE_OTHER): Payer: Medicare Other | Admitting: Pharmacist Clinician (PhC)/ Clinical Pharmacy Specialist

## 2015-07-15 VITALS — BP 128/68 | HR 77 | Temp 98.1°F | Ht 71.0 in | Wt 227.4 lb

## 2015-07-15 DIAGNOSIS — M25562 Pain in left knee: Secondary | ICD-10-CM | POA: Diagnosis not present

## 2015-07-15 DIAGNOSIS — E1169 Type 2 diabetes mellitus with other specified complication: Secondary | ICD-10-CM

## 2015-07-15 DIAGNOSIS — I480 Paroxysmal atrial fibrillation: Secondary | ICD-10-CM

## 2015-07-15 DIAGNOSIS — Z7901 Long term (current) use of anticoagulants: Secondary | ICD-10-CM

## 2015-07-15 DIAGNOSIS — M109 Gout, unspecified: Secondary | ICD-10-CM

## 2015-07-15 DIAGNOSIS — M25561 Pain in right knee: Secondary | ICD-10-CM

## 2015-07-15 DIAGNOSIS — E119 Type 2 diabetes mellitus without complications: Secondary | ICD-10-CM

## 2015-07-15 DIAGNOSIS — I10 Essential (primary) hypertension: Secondary | ICD-10-CM | POA: Diagnosis not present

## 2015-07-15 DIAGNOSIS — E875 Hyperkalemia: Secondary | ICD-10-CM

## 2015-07-15 DIAGNOSIS — G4700 Insomnia, unspecified: Secondary | ICD-10-CM | POA: Diagnosis not present

## 2015-07-15 DIAGNOSIS — F411 Generalized anxiety disorder: Secondary | ICD-10-CM

## 2015-07-15 DIAGNOSIS — E785 Hyperlipidemia, unspecified: Secondary | ICD-10-CM | POA: Diagnosis not present

## 2015-07-15 DIAGNOSIS — D649 Anemia, unspecified: Secondary | ICD-10-CM

## 2015-07-15 DIAGNOSIS — H409 Unspecified glaucoma: Secondary | ICD-10-CM

## 2015-07-15 DIAGNOSIS — L309 Dermatitis, unspecified: Secondary | ICD-10-CM

## 2015-07-15 DIAGNOSIS — E669 Obesity, unspecified: Secondary | ICD-10-CM

## 2015-07-15 DIAGNOSIS — I4891 Unspecified atrial fibrillation: Secondary | ICD-10-CM | POA: Diagnosis not present

## 2015-07-15 LAB — COMPREHENSIVE METABOLIC PANEL
ALT: 14 U/L (ref 0–53)
AST: 22 U/L (ref 0–37)
Albumin: 4.3 g/dL (ref 3.5–5.2)
Alkaline Phosphatase: 46 U/L (ref 39–117)
BILIRUBIN TOTAL: 0.5 mg/dL (ref 0.2–1.2)
BUN: 21 mg/dL (ref 6–23)
CHLORIDE: 106 meq/L (ref 96–112)
CO2: 31 meq/L (ref 19–32)
Calcium: 9.7 mg/dL (ref 8.4–10.5)
Creatinine, Ser: 1.13 mg/dL (ref 0.40–1.50)
GFR: 84.42 mL/min (ref >60.00)
GLUCOSE: 116 mg/dL — AB (ref 70–99)
Potassium: 4.6 mEq/L (ref 3.5–5.1)
Sodium: 142 mEq/L (ref 135–145)
Total Protein: 7.6 g/dL (ref 6.0–8.3)

## 2015-07-15 LAB — CBC
HCT: 47.4 % (ref 39.0–52.0)
HEMOGLOBIN: 14.9 g/dL (ref 13.0–17.0)
MCHC: 31.4 g/dL (ref 30.0–36.0)
MCV: 81.4 fl (ref 78.0–100.0)
PLATELETS: 238 10*3/uL (ref 150.0–400.0)
RBC: 5.82 Mil/uL — ABNORMAL HIGH (ref 4.22–5.81)
RDW: 17.4 % — ABNORMAL HIGH (ref 11.5–15.5)
WBC: 4.9 10*3/uL (ref 4.0–10.5)

## 2015-07-15 LAB — URIC ACID: Uric Acid, Serum: 6 mg/dL (ref 4.0–7.8)

## 2015-07-15 LAB — LIPID PANEL
CHOL/HDL RATIO: 5
Cholesterol: 140 mg/dL (ref 0–200)
HDL: 30.9 mg/dL — AB (ref >39.00)
LDL CALC: 90 mg/dL (ref 0–99)
NONHDL: 109.53
Triglycerides: 98 mg/dL (ref 0.0–149.0)
VLDL: 19.6 mg/dL (ref 0.0–40.0)

## 2015-07-15 LAB — TSH: TSH: 1.34 u[IU]/mL (ref 0.35–4.50)

## 2015-07-15 LAB — HEMOGLOBIN A1C: Hgb A1c MFr Bld: 6.4 % (ref 4.6–6.5)

## 2015-07-15 LAB — MAGNESIUM: Magnesium: 1.9 mg/dL (ref 1.5–2.5)

## 2015-07-15 LAB — POCT INR: INR: 3.7

## 2015-07-15 MED ORDER — HYDROCODONE-ACETAMINOPHEN 10-325 MG PO TABS: 1.0000 | ORAL_TABLET | Freq: Three times a day (TID) | ORAL | Status: DC | PRN

## 2015-07-15 MED ORDER — ALLOPURINOL 300 MG PO TABS: 300.0000 mg | ORAL_TABLET | Freq: Every day | ORAL | Status: DC

## 2015-07-15 NOTE — Assessment & Plan Note (Signed)
Stay well hydrated, check uric acid

## 2015-07-15 NOTE — Assessment & Plan Note (Signed)
Rate controlled 

## 2015-07-15 NOTE — Assessment & Plan Note (Signed)
Left leg improving with treatment no new concerns.

## 2015-07-15 NOTE — Progress Notes (Signed)
Subjective:    Patient ID: [name], male    DOB: Sep 27, 1950, 65 y.o.   MRN: [id number]  Chief Complaint  Patient presents with  . Follow-up    HPI Patient is in today for follow up with numerus concerns, SOB with activity. Pt c/o of bilateral knee pain, no injury or falls. No swelling or warmth. Denies polyuria or polydipsia. Does continue to struggle with SOB with minimal exertion but this is stable and he offers no other acute concerns. Denies CP/palp/HA/congestion/fevers/GI or GU c/o. Taking meds as prescribed   Past Medical History  Diagnosis Date  . Hypertension   . Hyperlipidemia   . Atrial fibrillation ([place])   . UNSPECIFIED ANEMIA 01/05/2010  . TESTICULAR HYPOFUNCTION 01/05/2010  . PULMONARY FUNCTION TESTS, ABNORMAL 02/02/2010  . PERIPHERAL NEUROPATHY 01/05/2010  . OBESITY NOS 01/25/2007  . NEUROMA 01/05/2010  . INSOMNIA, HX OF 01/25/2007  . HYPERTENSION 01/25/2007  . FATIGUE, CHRONIC 11/11/2009  . ERECTILE DYSFUNCTION 01/25/2007  . DYSPNEA 12/19/2009  . CHF 12/05/2009  . CHEST PAIN-UNSPECIFIED 11/11/2009  . BENIGN PROSTATIC HYPERTROPHY, HX OF 12/05/2009  . Atrial flutter ([place]) 12/05/2009  . AORTIC STENOSIS, MODERATE 12/05/2009  . Knee pain, right 12/28/2010  . Hypoxia 05/05/2011  . Heart murmur   . SLEEP APNEA 04/06/2010    uses cpap  . DM 12/05/2009  . Superficial thrombophlebitis of left leg 09/07/2011  . Atypical chest pain 11/08/2011  . 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 Hyperkalemia 04/18/2012  . Edema 04/18/2012  . Debility 04/18/2012  . Toe pain, right 07/10/2012  . Gout 07/10/2012  . OSA (obstructive sleep apnea) 07/13/2014  . Knee pain, bilateral 12/28/2010    Past Surgical History  Procedure Laterality Date  . Bilat foot surgery removed part of 5th metatarsal to corect curvature of toes    . Cardiac catheterization  10/16/2009    nonischemic cardiomyopathy  . Cardioversion N/A 03/25/2015    Procedure: CARDIOVERSION;  Surgeon: Pixie Casino, MD;  Location: [facility] ENDOSCOPY;   Service: Cardiovascular;  Laterality: N/A;    Family History  Problem Relation Age of Onset  . Clotting disorder Mother   . Heart disease Mother     s/p MI  . Heart attack Mother   . Hypertension Mother   . Diabetes Mother   . Hyperlipidemia Mother   . Stroke Mother   . Cancer Father     ? lung  . Lung disease Father     smoker  . Diabetes Sister   . Hypertension Sister     smoker  . Leukemia Maternal Grandmother     ?  [name] Aneurysm Sister     brain  . Other Sister     clipped  . Seizures Sister     d/o w/aneurysm/ smoker    Social History   Social History  . Marital Status: Married    Spouse Name: N/A  . Number of Children: N/A  . Years of Education: N/A   Occupational History  . Not on file.   Social History Main Topics  . Smoking status: Former Smoker -- 1.00 packs/day for 10 years    Quit date: 06/07/1980  . Smokeless tobacco: Never Used  . Alcohol Use: No  . Drug Use: No  . Sexual Activity: Not Currently   Other Topics Concern  . Not on file   Social History Narrative    Outpatient Prescriptions Prior to Visit  Medication Sig Dispense Refill  . albuterol (PROVENTIL HFA;VENTOLIN HFA) 108 (90  BASE) MCG/ACT inhaler Inhale 2 puffs into the lungs every 6 (six) hours as needed for wheezing. 3 Inhaler 3  . aspirin EC 81 MG tablet Take 81 mg by mouth daily.    . benazepril (LOTENSIN) 40 MG tablet TAKE ONE TABLET BY MOUTH ONCE DAILY 90 tablet 1  . calcium carbonate (TUMS - DOSED IN MG ELEMENTAL CALCIUM) 500 MG chewable tablet Chew 1 tablet by mouth daily as needed. For heartburn    . cetirizine (ZYRTEC) 10 MG tablet Take 1 tablet (10 mg total) by mouth daily. 30 tablet 11  . Clobetasol Prop Emollient Base (CLOBETASOL PROPIONATE E) 0.05 % emollient cream Apply 1 application topically 2 (two) times daily. (Patient taking differently: Apply 1 application topically 2 (two) times daily as needed (insect bite). ) 60 g 0  . COLCRYS 0.6 MG tablet TAKE ONE TABLET BY  MOUTH EVERY TWO HOURS FOR FOUR DOSES OR PAIN RELIEF TODAY. THEN TAKE ONE TABLET BY MOUTH ONCE DAILY (Patient taking differently: TAKE ONE TABLET BY MOUTH DAILY AS NEEDED FOR GOUT FLARE UPS.) 35 tablet 0  . fish oil-omega-3 fatty acids 1000 MG capsule Take 1 g by mouth daily.     . furosemide (LASIX) 40 MG tablet TAKE ONE TABLET BY MOUTH ONCE DAILY 90 tablet 1  . glyBURIDE (DIABETA) 5 MG tablet Take 1 tablet (5 mg total) by mouth daily with breakfast. 90 tablet 3  . KLOR-CON M20 20 MEQ tablet TAKE ONE-HALF TO ONE TABLET BY MOUTH ONCE DAILY AS DIRECTED 90 tablet 1  . LORazepam (ATIVAN) 1 MG tablet Take 1 tablet (1 mg total) by mouth 2 (two) times daily as needed for anxiety. 60 tablet 2  . magnesium oxide (MAG-OX) 400 MG tablet Take 1 tablet (400 mg total) by mouth daily. 30 tablet 11  . metFORMIN (GLUCOPHAGE) 1000 MG tablet TAKE ONE TABLET BY MOUTH TWICE DAILY WITH MEALS 60 tablet 11  . metoprolol (LOPRESSOR) 100 MG tablet TAKE ONE TABLET BY MOUTH TWICE DAILY 180 tablet 1  . Multiple Vitamins-Minerals (MULTIVITAMIN PO) Take 1 tablet by mouth daily.    . mupirocin ointment (BACTROBAN) 2 % Place 1 application into the nose 2 (two) times daily. 30 g 1  . naproxen (NAPROSYN) 375 MG tablet TAKE ONE TABLET BY MOUTH ONCE DAILY AS NEEDED FOR PAIN 30 tablet 4  . pravastatin (PRAVACHOL) 40 MG tablet TAKE ONE TABLET BY MOUTH ONCE DAILY 30 tablet 6  . temazepam (RESTORIL) 30 MG capsule TAKE ONE CAPSULE BY MOUTH AT BEDTIME AS NEEDED FOR  SLEEP  OR  ANXIETY 30 capsule 3  . Testosterone 10 MG/ACT (2%) GEL Apply 1 application topically daily. Use as directed    . TIKOSYN 500 MCG capsule TAKE 1 CAPSULE BY MOUTH EVERY 12 HOURS 60 capsule 2  . warfarin (COUMADIN) 4 MG tablet TAKE ONE TABLET BY MOUTH ONCE DAILY OR AS DIRECTED 90 tablet 1  . allopurinol (ZYLOPRIM) 300 MG tablet Take 1 tablet (300 mg total) by mouth daily. 90 tablet 2  . HYDROcodone-acetaminophen (NORCO) 10-325 MG tablet Take 1 tablet by mouth every 8  (eight) hours as needed for moderate pain or severe pain. 90 tablet 0   No facility-administered medications prior to visit.    Allergies  Allergen Reactions  . Sulfonamide Derivatives Other (See Comments)    unknown reaction.  Patient states had to go to the ER.   [name] Zolpidem     Excessive, prolonged sedation    Review of Systems  Constitutional: Positive  for malaise/fatigue. Negative for fever.  HENT: Negative for congestion.   Eyes: Negative for discharge.  Respiratory: Positive for shortness of breath.   Cardiovascular: Negative for chest pain, palpitations and leg swelling.  Gastrointestinal: Negative for nausea and abdominal pain.  Genitourinary: Negative for dysuria.  Musculoskeletal: Positive for joint pain. Negative for falls.  Skin: Negative for rash.  Neurological: Negative for loss of consciousness and headaches.  Endo/Heme/Allergies: Negative for environmental allergies.  Psychiatric/Behavioral: Negative for depression. The patient is not nervous/anxious.        Objective:    Physical Exam  Constitutional: He is oriented to person, place, and time. He appears well-developed and well-nourished. No distress.  HENT:  Head: Normocephalic and atraumatic.  Nose: Nose normal.  Eyes: Right eye exhibits no discharge. Left eye exhibits no discharge.  Neck: Normal range of motion. Neck supple.  Cardiovascular: Normal rate and regular rhythm.   No murmur heard. Pulmonary/Chest: Effort normal and breath sounds normal.  Abdominal: Soft. Bowel sounds are normal. There is no tenderness.  Musculoskeletal: He exhibits no edema.  Neurological: He is alert and oriented to person, place, and time.  Skin: Skin is warm and dry.  Psychiatric: He has a normal mood and affect.  Nursing note and vitals reviewed.   BP 128/68 mmHg  Pulse 77  Temp(Src) 98.1 F (36.7 C) (Oral)  Ht 5\' 11"  (1.803 m)  Wt 227 lb 6 oz (103.137 kg)  BMI 31.73 kg/m2  SpO2 99% Wt Readings from Last 3  Encounters:  07/15/15 227 lb 6 oz (103.137 kg)  05/07/15 231 lb 8 oz (105.008 kg)  04/14/15 232 lb 2 oz (105.291 kg)     Lab Results  Component Value Date   WBC 4.9 07/15/2015   HGB 14.9 07/15/2015   HCT 47.4 07/15/2015   PLT 238.0 07/15/2015   GLUCOSE 116* 07/15/2015   CHOL 140 07/15/2015   TRIG 98.0 07/15/2015   HDL 30.90* 07/15/2015   LDLCALC 90 07/15/2015   ALT 14 07/15/2015   AST 22 07/15/2015   NA 142 07/15/2015   K 4.6 07/15/2015   CL 106 07/15/2015   CREATININE 1.13 07/15/2015   BUN 21 07/15/2015   CO2 31 07/15/2015   TSH 1.34 07/15/2015   PSA 4.26* 12/29/2009   INR 3.7 07/15/2015   HGBA1C 6.4 07/15/2015   MICROALBUR 1.1 04/14/2015    Lab Results  Component Value Date   TSH 1.34 07/15/2015   Lab Results  Component Value Date   WBC 4.9 07/15/2015   HGB 14.9 07/15/2015   HCT 47.4 07/15/2015   MCV 81.4 07/15/2015   PLT 238.0 07/15/2015   Lab Results  Component Value Date   NA 142 07/15/2015   K 4.6 07/15/2015   CO2 31 07/15/2015   GLUCOSE 116* 07/15/2015   BUN 21 07/15/2015   CREATININE 1.13 07/15/2015   BILITOT 0.5 07/15/2015   ALKPHOS 46 07/15/2015   AST 22 07/15/2015   ALT 14 07/15/2015   PROT 7.6 07/15/2015   ALBUMIN 4.3 07/15/2015   CALCIUM 9.7 07/15/2015   GFR 84.42 07/15/2015   Lab Results  Component Value Date   CHOL 140 07/15/2015   Lab Results  Component Value Date   HDL 30.90* 07/15/2015   Lab Results  Component Value Date   LDLCALC 90 07/15/2015   Lab Results  Component Value Date   TRIG 98.0 07/15/2015   Lab Results  Component Value Date   CHOLHDL 5 07/15/2015   Lab Results  Component  Value Date   HGBA1C 6.4 07/15/2015       Assessment & Plan:   Problem List Items Addressed This Visit    Anemia   Relevant Medications   allopurinol (ZYLOPRIM) 300 MG tablet   Other Relevant Orders   TSH (Completed)   Magnesium (Completed)   Atrial fibrillation, converted to SR after 2nd dose of Tikosyn.    Rate  controlled      Dermatitis    Left leg improving with treatment no new concerns.      Diabetes mellitus type 2 in obese (HCC)    hgba1c acceptable, minimize simple carbs. Increase exercise as tolerated. Continue current meds      Relevant Medications   allopurinol (ZYLOPRIM) 300 MG tablet   Other Relevant Orders   Hemoglobin A1c (Completed)   Essential hypertension    Well controlled, no changes to meds. Encouraged heart healthy diet such as the DASH diet and exercise as tolerated.       Relevant Medications   allopurinol (ZYLOPRIM) 300 MG tablet   Other Relevant Orders   CBC (Completed)   Comprehensive metabolic panel (Completed)   TSH (Completed)   Gout    Stay well hydrated, check uric acid      RESOLVED: Hyperkalemia   Relevant Medications   allopurinol (ZYLOPRIM) 300 MG tablet   Hyperlipidemia    Encouraged heart healthy diet, increase exercise, avoid trans fats, consider a krill oil cap daily      Relevant Medications   allopurinol (ZYLOPRIM) 300 MG tablet   Other Relevant Orders   Lipid panel (Completed)   Knee pain, bilateral   Relevant Medications   allopurinol (ZYLOPRIM) 300 MG tablet   Other Relevant Orders   Uric acid (Completed)   Long term (current) use of anticoagulants    Tolerating Coumadin, levels stable      Relevant Medications   allopurinol (ZYLOPRIM) 300 MG tablet   Obesity    Encouraged DASH diet, decrease po intake and increase exercise as tolerated. Needs 7-8 hours of sleep nightly. Avoid trans fats, eat small, frequent meals every 4-5 hours with lean proteins, complex carbs and healthy fats. Minimize simple carbs       Other Visit Diagnoses    Insomnia    -  Primary    Relevant Medications    allopurinol (ZYLOPRIM) 300 MG tablet    Anxiety state        Relevant Medications    allopurinol (ZYLOPRIM) 300 MG tablet    Glaucoma        Relevant Medications    allopurinol (ZYLOPRIM) 300 MG tablet       I am having [name]  maintain his fish oil-omega-3 fatty acids, calcium carbonate, aspirin EC, Multiple Vitamins-Minerals (MULTIVITAMIN PO), COLCRYS, albuterol, naproxen, Testosterone, metFORMIN, mupirocin ointment, glyBURIDE, cetirizine, Clobetasol Prop Emollient Base, pravastatin, furosemide, metoprolol, KLOR-CON M20, warfarin, benazepril, temazepam, LORazepam, magnesium oxide, TIKOSYN, allopurinol, and HYDROcodone-acetaminophen.  Meds ordered this encounter  Medications  . allopurinol (ZYLOPRIM) 300 MG tablet    Sig: Take 1 tablet (300 mg total) by mouth daily.    Dispense:  90 tablet    Refill:  2    D/C PREVIOUS SCRIPTS FOR THIS MEDICATION  . HYDROcodone-acetaminophen (NORCO) 10-325 MG tablet    Sig: Take 1 tablet by mouth every 8 (eight) hours as needed for moderate pain or severe pain.    Dispense:  90 tablet    Refill:  0     Penni Homans, MD

## 2015-07-15 NOTE — Assessment & Plan Note (Signed)
Well controlled, no changes to meds. Encouraged heart healthy diet such as the DASH diet and exercise as tolerated.  °

## 2015-07-15 NOTE — Patient Instructions (Addendum)
Salonpas and Aspercreme as needed for knee. Arthritis  Arthritis is a term that is commonly used to refer to joint pain or joint disease. There are more than 100 types of arthritis. CAUSES The most common cause of this condition is wear and tear of a joint. Other causes include:  Gout.  Inflammation of a joint.  An infection of a joint.  Sprains and other injuries near the joint.  A drug reaction or allergic reaction. In some cases, the cause may not be known. SYMPTOMS The main symptom of this condition is pain in the joint with movement. Other symptoms include:  Redness, swelling, or stiffness at a joint.  Warmth coming from the joint.  Fever.  Overall feeling of illness. DIAGNOSIS This condition may be diagnosed with a physical exam and tests, including:  Blood tests.  Urine tests.  Imaging tests, such as MRI, X-rays, or a CT scan. Sometimes, fluid is removed from a joint for testing. TREATMENT Treatment for this condition may involve:  Treatment of the cause, if it is known.  Rest.  Raising (elevating) the joint.  Applying cold or hot packs to the joint.  Medicines to improve symptoms and reduce inflammation.  Injections of a steroid such as cortisone into the joint to help reduce pain and inflammation. Depending on the cause of your arthritis, you may need to make lifestyle changes to reduce stress on your joint. These changes may include exercising more and losing weight. HOME CARE INSTRUCTIONS Medicines  Take over-the-counter and prescription medicines only as told by your health care provider.  Do not take aspirin to relieve pain if gout is suspected. Activities  Rest your joint if told by your health care provider. Rest is important when your disease is active and your joint feels painful, swollen, or stiff.  Avoid activities that make the pain worse. It is important to balance activity with rest.  Exercise your joint regularly with  range-of-motion exercises as told by your health care provider. Try doing low-impact exercise, such as:  Swimming.  Water aerobics.  Biking.  Walking. Joint Care  If your joint is swollen, keep it elevated if told by your health care provider.  If your joint feels stiff in the morning, try taking a warm shower.  If directed, apply heat to the joint. If you have diabetes, do not apply heat without permission from your health care provider.  Put a towel between the joint and the hot pack or heating pad.  Leave the heat on the area for 20-30 minutes.  If directed, apply ice to the joint:  Put ice in a plastic bag.  Place a towel between your skin and the bag.  Leave the ice on for 20 minutes, 2-3 times per day.  Keep all follow-up visits as told by your health care provider. This is important. SEEK MEDICAL CARE IF:  The pain gets worse.  You have a fever. SEEK IMMEDIATE MEDICAL CARE IF:  You develop severe joint pain, swelling, or redness.  Many joints become painful and swollen.  You develop severe back pain.  You develop severe weakness in your leg.  You cannot control your bladder or bowels.   This information is not intended to replace advice given to you by your health care provider. Make sure you discuss any questions you have with your health care provider.   Document Released: 07/01/2004 Document Revised: 02/12/2015 Document Reviewed: 08/19/2014 Elsevier Interactive Patient Education [employer].

## 2015-07-15 NOTE — Assessment & Plan Note (Signed)
Encouraged DASH diet, decrease po intake and increase exercise as tolerated. Needs 7-8 hours of sleep nightly. Avoid trans fats, eat small, frequent meals every 4-5 hours with lean proteins, complex carbs and healthy fats. Minimize simple carbs 

## 2015-07-15 NOTE — Assessment & Plan Note (Signed)
Encouraged heart healthy diet, increase exercise, avoid trans fats, consider a krill oil cap daily 

## 2015-07-15 NOTE — Assessment & Plan Note (Signed)
Tolerating Coumadin, levels stable

## 2015-07-15 NOTE — Progress Notes (Signed)
Pre visit review using our clinic review tool, if applicable. No additional management support is needed unless otherwise documented below in the visit note. 

## 2015-07-15 NOTE — Assessment & Plan Note (Signed)
hgba1c acceptable, minimize simple carbs. Increase exercise as tolerated. Continue current meds 

## 2015-07-21 ENCOUNTER — Encounter: Payer: Self-pay | Admitting: Family Medicine

## 2015-07-28 ENCOUNTER — Other Ambulatory Visit: Payer: Self-pay | Admitting: Cardiovascular Disease

## 2015-07-28 NOTE — Telephone Encounter (Signed)
REFILL 

## 2015-07-30 ENCOUNTER — Telehealth: Payer: Self-pay | Admitting: *Deleted

## 2015-07-30 ENCOUNTER — Telehealth: Payer: Self-pay | Admitting: Cardiovascular Disease

## 2015-07-30 DIAGNOSIS — I480 Paroxysmal atrial fibrillation: Secondary | ICD-10-CM

## 2015-07-30 NOTE — Telephone Encounter (Signed)
New message      Need prior authorization for tikosyn.  Pt has about 5 days of medication left

## 2015-07-30 NOTE — Telephone Encounter (Signed)
pt needs PA on his tikosyn. gave him the number for [facility]. as he requested. he willcall and speak with someone there about a Prior Auth.

## 2015-07-31 MED ORDER — DOFETILIDE 500 MCG PO CAPS: 500.0000 ug | ORAL_CAPSULE | Freq: Two times a day (BID) | ORAL | Status: DC

## 2015-07-31 NOTE — Telephone Encounter (Signed)
Per Dr. Loletha Grayer OK to switch to generic tikosyn 500 mg bid and have patient come in for an EKG 5 days after starting.  Patient has about 5 days left of the brand and once he switches he will call to schedule the EKG.

## 2015-08-04 ENCOUNTER — Ambulatory Visit (INDEPENDENT_AMBULATORY_CARE_PROVIDER_SITE_OTHER): Payer: Medicare Other | Admitting: Pharmacist Clinician (PhC)/ Clinical Pharmacy Specialist

## 2015-08-04 ENCOUNTER — Other Ambulatory Visit: Payer: Self-pay | Admitting: Family Medicine

## 2015-08-04 DIAGNOSIS — I480 Paroxysmal atrial fibrillation: Secondary | ICD-10-CM

## 2015-08-04 DIAGNOSIS — Z7901 Long term (current) use of anticoagulants: Secondary | ICD-10-CM

## 2015-08-04 DIAGNOSIS — I4891 Unspecified atrial fibrillation: Secondary | ICD-10-CM

## 2015-08-04 LAB — POCT INR: INR: 3

## 2015-08-06 ENCOUNTER — Other Ambulatory Visit: Payer: Self-pay | Admitting: Family Medicine

## 2015-08-18 ENCOUNTER — Ambulatory Visit (INDEPENDENT_AMBULATORY_CARE_PROVIDER_SITE_OTHER): Payer: Medicare Other | Admitting: *Deleted

## 2015-08-18 ENCOUNTER — Ambulatory Visit (INDEPENDENT_AMBULATORY_CARE_PROVIDER_SITE_OTHER): Payer: Medicare Other | Admitting: Pharmacist Clinician (PhC)/ Clinical Pharmacy Specialist

## 2015-08-18 ENCOUNTER — Other Ambulatory Visit: Payer: Self-pay | Admitting: Family Medicine

## 2015-08-18 DIAGNOSIS — I4891 Unspecified atrial fibrillation: Secondary | ICD-10-CM | POA: Diagnosis not present

## 2015-08-18 DIAGNOSIS — Z79899 Other long term (current) drug therapy: Secondary | ICD-10-CM

## 2015-08-18 DIAGNOSIS — Z7901 Long term (current) use of anticoagulants: Secondary | ICD-10-CM

## 2015-08-18 DIAGNOSIS — I480 Paroxysmal atrial fibrillation: Secondary | ICD-10-CM

## 2015-08-18 DIAGNOSIS — Z5181 Encounter for therapeutic drug level monitoring: Secondary | ICD-10-CM

## 2015-08-18 LAB — POCT INR: INR: 2.3

## 2015-08-18 NOTE — Progress Notes (Signed)
EKG performed. Reviewed by Dr. Oval Linsey and is OK - recent change from brand to generic tikosyn. Patient released.

## 2015-08-21 ENCOUNTER — Telehealth: Payer: Self-pay | Admitting: Family Medicine

## 2015-08-21 MED ORDER — ALBUTEROL SULFATE HFA 108 (90 BASE) MCG/ACT IN AERS: INHALATION_SPRAY | RESPIRATORY_TRACT | Status: DC

## 2015-08-21 NOTE — Telephone Encounter (Signed)
Please switch him to ventolin with same sig and discontinue Proventil due to insurance formulary

## 2015-08-21 NOTE — Telephone Encounter (Signed)
Fax from pharmacy stating that proventil not covered.  Covered alternatives are proair or ventolin?

## 2015-08-22 MED ORDER — ALBUTEROL SULFATE HFA 108 (90 BASE) MCG/ACT IN AERS: 2.0000 | INHALATION_SPRAY | Freq: Four times a day (QID) | RESPIRATORY_TRACT | Status: DC | PRN

## 2015-08-22 NOTE — Addendum Note (Signed)
Addended by: Sharon Seller B on: 08/22/2015 04:36 PM   Modules accepted: Orders, Medications

## 2015-08-31 ENCOUNTER — Other Ambulatory Visit: Payer: Self-pay | Admitting: Cardiovascular Disease

## 2015-09-07 ENCOUNTER — Other Ambulatory Visit: Payer: Self-pay | Admitting: Family Medicine

## 2015-09-15 ENCOUNTER — Ambulatory Visit (INDEPENDENT_AMBULATORY_CARE_PROVIDER_SITE_OTHER): Payer: Medicare Other | Admitting: Pharmacist Clinician (PhC)/ Clinical Pharmacy Specialist

## 2015-09-15 DIAGNOSIS — I480 Paroxysmal atrial fibrillation: Secondary | ICD-10-CM | POA: Diagnosis not present

## 2015-09-15 DIAGNOSIS — I4891 Unspecified atrial fibrillation: Secondary | ICD-10-CM

## 2015-09-15 DIAGNOSIS — Z7901 Long term (current) use of anticoagulants: Secondary | ICD-10-CM

## 2015-09-15 LAB — POCT INR: INR: 2.6

## 2015-10-06 ENCOUNTER — Other Ambulatory Visit: Payer: Self-pay | Admitting: Cardiovascular Disease

## 2015-10-06 ENCOUNTER — Other Ambulatory Visit: Payer: Self-pay | Admitting: Family Medicine

## 2015-10-06 NOTE — Telephone Encounter (Signed)
Rx request sent to pharmacy.  

## 2015-10-13 ENCOUNTER — Ambulatory Visit (INDEPENDENT_AMBULATORY_CARE_PROVIDER_SITE_OTHER): Payer: Medicare Other | Admitting: Pharmacist Clinician (PhC)/ Clinical Pharmacy Specialist

## 2015-10-13 DIAGNOSIS — I480 Paroxysmal atrial fibrillation: Secondary | ICD-10-CM | POA: Diagnosis not present

## 2015-10-13 DIAGNOSIS — Z7901 Long term (current) use of anticoagulants: Secondary | ICD-10-CM

## 2015-10-13 DIAGNOSIS — I4891 Unspecified atrial fibrillation: Secondary | ICD-10-CM

## 2015-10-13 LAB — POCT INR: INR: 3.2

## 2015-10-22 ENCOUNTER — Other Ambulatory Visit: Payer: Self-pay | Admitting: Family Medicine

## 2015-10-23 NOTE — Telephone Encounter (Signed)
Faxed hardcopy for temazepam and lorazepam to [facility].

## 2015-11-02 ENCOUNTER — Other Ambulatory Visit: Payer: Self-pay | Admitting: Family Medicine

## 2015-11-05 ENCOUNTER — Ambulatory Visit (INDEPENDENT_AMBULATORY_CARE_PROVIDER_SITE_OTHER): Payer: Medicare Other | Admitting: Pharmacist

## 2015-11-05 ENCOUNTER — Telehealth: Payer: Self-pay | Admitting: Internal Medicine

## 2015-11-05 DIAGNOSIS — I480 Paroxysmal atrial fibrillation: Secondary | ICD-10-CM

## 2015-11-05 DIAGNOSIS — I4891 Unspecified atrial fibrillation: Secondary | ICD-10-CM

## 2015-11-05 DIAGNOSIS — Z7901 Long term (current) use of anticoagulants: Secondary | ICD-10-CM | POA: Diagnosis not present

## 2015-11-05 LAB — POCT INR: INR: 3.1

## 2015-11-05 NOTE — Telephone Encounter (Signed)
error 

## 2015-11-14 ENCOUNTER — Other Ambulatory Visit: Payer: Self-pay | Admitting: Family Medicine

## 2015-11-14 ENCOUNTER — Other Ambulatory Visit: Payer: Medicare Other

## 2015-11-14 ENCOUNTER — Telehealth: Payer: Self-pay | Admitting: Family Medicine

## 2015-11-14 DIAGNOSIS — E119 Type 2 diabetes mellitus without complications: Secondary | ICD-10-CM

## 2015-11-14 DIAGNOSIS — E785 Hyperlipidemia, unspecified: Secondary | ICD-10-CM

## 2015-11-14 DIAGNOSIS — I10 Essential (primary) hypertension: Secondary | ICD-10-CM

## 2015-11-14 MED ORDER — METFORMIN HCL 1000 MG PO TABS: 1000.0000 mg | ORAL_TABLET | Freq: Two times a day (BID) | ORAL | Status: DC

## 2015-11-14 NOTE — Telephone Encounter (Signed)
Labs ordered to be done on Monday 11/17/2015. Patient will just do at his office visit here.

## 2015-11-14 NOTE — Telephone Encounter (Signed)
Magnesium, lipid, cmp, cbc, tsh, hgba1c, htn, hyperlipidemia, DM

## 2015-11-14 NOTE — Telephone Encounter (Signed)
Relation to [id number] Call back number:[phone]   Reason for call:  Patient requesting pre visit labs prior to Monday 11/17/15 appointment patient states please include full panel with magnesium. Patient states PCP normally requires pre visit labs and would like to go over to [facility] first thing in the morning and would like a call when orders are placed.

## 2015-11-17 ENCOUNTER — Encounter: Payer: Self-pay | Admitting: Family Medicine

## 2015-11-17 ENCOUNTER — Ambulatory Visit (INDEPENDENT_AMBULATORY_CARE_PROVIDER_SITE_OTHER): Payer: Medicare Other | Admitting: Family Medicine

## 2015-11-17 VITALS — BP 110/72 | HR 75 | Temp 98.5°F | Ht 71.0 in | Wt 221.2 lb

## 2015-11-17 DIAGNOSIS — I10 Essential (primary) hypertension: Secondary | ICD-10-CM

## 2015-11-17 DIAGNOSIS — M109 Gout, unspecified: Secondary | ICD-10-CM

## 2015-11-17 DIAGNOSIS — R5381 Other malaise: Secondary | ICD-10-CM

## 2015-11-17 DIAGNOSIS — E119 Type 2 diabetes mellitus without complications: Secondary | ICD-10-CM

## 2015-11-17 DIAGNOSIS — E669 Obesity, unspecified: Secondary | ICD-10-CM

## 2015-11-17 DIAGNOSIS — E785 Hyperlipidemia, unspecified: Secondary | ICD-10-CM

## 2015-11-17 DIAGNOSIS — E1169 Type 2 diabetes mellitus with other specified complication: Secondary | ICD-10-CM

## 2015-11-17 DIAGNOSIS — Z7901 Long term (current) use of anticoagulants: Secondary | ICD-10-CM

## 2015-11-17 DIAGNOSIS — R0902 Hypoxemia: Secondary | ICD-10-CM

## 2015-11-17 DIAGNOSIS — G4733 Obstructive sleep apnea (adult) (pediatric): Secondary | ICD-10-CM

## 2015-11-17 LAB — CBC
HEMATOCRIT: 47.1 % (ref 39.0–52.0)
HEMOGLOBIN: 15 g/dL (ref 13.0–17.0)
MCHC: 31.9 g/dL (ref 30.0–36.0)
MCV: 79.5 fl (ref 78.0–100.0)
PLATELETS: 214 10*3/uL (ref 150.0–400.0)
RBC: 5.93 Mil/uL — AB (ref 4.22–5.81)
RDW: 19.1 % — AB (ref 11.5–15.5)
WBC: 4.6 10*3/uL (ref 4.0–10.5)

## 2015-11-17 LAB — LIPID PANEL
CHOL/HDL RATIO: 5
Cholesterol: 133 mg/dL (ref 0–200)
HDL: 28.1 mg/dL — AB (ref >39.00)
LDL CALC: 83 mg/dL (ref 0–99)
NONHDL: 105.09
Triglycerides: 112 mg/dL (ref 0.0–149.0)
VLDL: 22.4 mg/dL (ref 0.0–40.0)

## 2015-11-17 LAB — COMPREHENSIVE METABOLIC PANEL
ALK PHOS: 37 U/L — AB (ref 39–117)
ALT: 12 U/L (ref 0–53)
AST: 16 U/L (ref 0–37)
Albumin: 4.2 g/dL (ref 3.5–5.2)
BUN: 22 mg/dL (ref 6–23)
CHLORIDE: 106 meq/L (ref 96–112)
CO2: 26 meq/L (ref 19–32)
Calcium: 9.5 mg/dL (ref 8.4–10.5)
Creatinine, Ser: 1.23 mg/dL (ref 0.40–1.50)
GFR: 76.47 mL/min (ref >60.00)
GLUCOSE: 148 mg/dL — AB (ref 70–99)
POTASSIUM: 4.6 meq/L (ref 3.5–5.1)
SODIUM: 140 meq/L (ref 135–145)
Total Bilirubin: 0.9 mg/dL (ref 0.2–1.2)
Total Protein: 7.4 g/dL (ref 6.0–8.3)

## 2015-11-17 LAB — HEMOGLOBIN A1C: HEMOGLOBIN A1C: 6.4 % (ref 4.6–6.5)

## 2015-11-17 LAB — URIC ACID: Uric Acid, Serum: 5.7 mg/dL (ref 4.0–7.8)

## 2015-11-17 LAB — TSH: TSH: 2.11 u[IU]/mL (ref 0.35–4.50)

## 2015-11-17 LAB — MAGNESIUM: Magnesium: 2 mg/dL (ref 1.5–2.5)

## 2015-11-17 MED ORDER — HYDROCODONE-ACETAMINOPHEN 10-325 MG PO TABS: 1.0000 | ORAL_TABLET | Freq: Three times a day (TID) | ORAL | Status: DC | PRN

## 2015-11-17 MED ORDER — TEMAZEPAM 30 MG PO CAPS: ORAL_CAPSULE | ORAL | Status: DC

## 2015-11-17 MED ORDER — LORAZEPAM 1 MG PO TABS: 1.0000 mg | ORAL_TABLET | Freq: Two times a day (BID) | ORAL | Status: DC | PRN

## 2015-11-17 NOTE — Assessment & Plan Note (Signed)
wearing CPAP nightly, last Sleep study 2011. Waking up tired and feeling more SOB recently.

## 2015-11-17 NOTE — Progress Notes (Signed)
Pre visit review using our clinic review tool, if applicable. No additional management support is needed unless otherwise documented below in the visit note. 

## 2015-11-17 NOTE — Assessment & Plan Note (Signed)
Encouraged DASH diet, decrease po intake and increase exercise as tolerated. Needs 7-8 hours of sleep nightly. Avoid trans fats, eat small, frequent meals every 4-5 hours with lean proteins, complex carbs and healthy fats. Minimize simple carbs, GMO foods. 

## 2015-11-17 NOTE — Assessment & Plan Note (Signed)
Struggling with multifactorial causes including cardiac and pulmonology

## 2015-11-17 NOTE — Assessment & Plan Note (Signed)
Well controlled, no changes to meds. Encouraged heart healthy diet such as the DASH diet and exercise as tolerated.  °

## 2015-11-17 NOTE — Patient Instructions (Signed)
Sleep Apnea  Sleep apnea is a sleep disorder characterized by abnormal pauses in breathing while you sleep. When your breathing pauses, the level of oxygen in your blood decreases. This causes you to move out of deep sleep and into light sleep. As a result, your quality of sleep is poor, and the system that carries your blood throughout your body (cardiovascular system) experiences stress. If sleep apnea remains untreated, the following conditions can develop:  High blood pressure (hypertension).  Coronary artery disease.  Inability to achieve or maintain an erection (impotence).  Impairment of your thought process (cognitive dysfunction). There are three types of sleep apnea: 1. Obstructive sleep apnea--Pauses in breathing during sleep because of a blocked airway. 2. Central sleep apnea--Pauses in breathing during sleep because the area of the brain that controls your breathing does not send the correct signals to the muscles that control breathing. 3. Mixed sleep apnea--A combination of both obstructive and central sleep apnea. RISK FACTORS The following risk factors can increase your risk of developing sleep apnea:  Being overweight.  Smoking.  Having narrow passages in your nose and throat.  Being of older age.  Being male.  Alcohol use.  Sedative and tranquilizer use.  Ethnicity. Among individuals younger than 35 years, African Americans are at increased risk of sleep apnea. SYMPTOMS   Difficulty staying asleep.  Daytime sleepiness and fatigue.  Loss of energy.  Irritability.  Loud, heavy snoring.  Morning headaches.  Trouble concentrating.  Forgetfulness.  Decreased interest in sex.  Unexplained sleepiness. DIAGNOSIS  In order to diagnose sleep apnea, your caregiver will perform a physical examination. A sleep study done in the comfort of your own home may be appropriate if you are otherwise healthy. Your caregiver may also recommend that you spend the  night in a sleep lab. In the sleep lab, several monitors record information about your heart, lungs, and brain while you sleep. Your leg and arm movements and blood oxygen level are also recorded. TREATMENT The following actions may help to resolve mild sleep apnea:  Sleeping on your side.   Using a decongestant if you have nasal congestion.   Avoiding the use of depressants, including alcohol, sedatives, and narcotics.   Losing weight and modifying your diet if you are overweight. There also are devices and treatments to help open your airway:  Oral appliances. These are custom-made mouthpieces that shift your lower jaw forward and slightly open your bite. This opens your airway.  Devices that create positive airway pressure. This positive pressure "splints" your airway open to help you breathe better during sleep. The following devices create positive airway pressure:  Continuous positive airway pressure (CPAP) device. The CPAP device creates a continuous level of air pressure with an air pump. The air is delivered to your airway through a mask while you sleep. This continuous pressure keeps your airway open.  Nasal expiratory positive airway pressure (EPAP) device. The EPAP device creates positive air pressure as you exhale. The device consists of single-use valves, which are inserted into each nostril and held in place by adhesive. The valves create very little resistance when you inhale but create much more resistance when you exhale. That increased resistance creates the positive airway pressure. This positive pressure while you exhale keeps your airway open, making it easier to breath when you inhale again.  Bilevel positive airway pressure (BPAP) device. The BPAP device is used mainly in patients with central sleep apnea. This device is similar to the CPAP device because   it also uses an air pump to deliver continuous air pressure through a mask. However, with the BPAP machine, the  pressure is set at two different levels. The pressure when you exhale is lower than the pressure when you inhale.  Surgery. Typically, surgery is only done if you cannot comply with less invasive treatments or if the less invasive treatments do not improve your condition. Surgery involves removing excess tissue in your airway to create a wider passage way.   This information is not intended to replace advice given to you by your health care provider. Make sure you discuss any questions you have with your health care provider.   Document Released: 05/14/2002 Document Revised: 06/14/2014 Document Reviewed: 09/30/2011 Elsevier Interactive Patient Education 2016 Elsevier Inc.   

## 2015-11-17 NOTE — Assessment & Plan Note (Signed)
Pulse ox at home was 94%. Checks at home.

## 2015-11-17 NOTE — Assessment & Plan Note (Signed)
Tolerating Warfarin, no changes

## 2015-11-17 NOTE — Assessment & Plan Note (Signed)
Tolerating statin, encouraged heart healthy diet, avoid trans fats, minimize simple carbs and saturated fats. Increase exercise as tolerated 

## 2015-11-24 DIAGNOSIS — N401 Enlarged prostate with lower urinary tract symptoms: Secondary | ICD-10-CM | POA: Diagnosis not present

## 2015-11-26 ENCOUNTER — Ambulatory Visit (INDEPENDENT_AMBULATORY_CARE_PROVIDER_SITE_OTHER): Payer: Medicare Other | Admitting: Pharmacist Clinician (PhC)/ Clinical Pharmacy Specialist

## 2015-11-26 ENCOUNTER — Ambulatory Visit (INDEPENDENT_AMBULATORY_CARE_PROVIDER_SITE_OTHER): Payer: Medicare Other | Admitting: Adult Health

## 2015-11-26 ENCOUNTER — Encounter: Payer: Self-pay | Admitting: Adult Health

## 2015-11-26 VITALS — BP 126/78 | HR 74 | Temp 98.0°F | Ht 71.0 in | Wt 220.0 lb

## 2015-11-26 DIAGNOSIS — I480 Paroxysmal atrial fibrillation: Secondary | ICD-10-CM

## 2015-11-26 DIAGNOSIS — G4733 Obstructive sleep apnea (adult) (pediatric): Secondary | ICD-10-CM | POA: Diagnosis not present

## 2015-11-26 DIAGNOSIS — I4891 Unspecified atrial fibrillation: Secondary | ICD-10-CM | POA: Diagnosis not present

## 2015-11-26 DIAGNOSIS — E669 Obesity, unspecified: Secondary | ICD-10-CM | POA: Diagnosis not present

## 2015-11-26 DIAGNOSIS — Z7901 Long term (current) use of anticoagulants: Secondary | ICD-10-CM | POA: Diagnosis not present

## 2015-11-26 LAB — POCT INR: INR: 3.1

## 2015-11-26 NOTE — Assessment & Plan Note (Signed)
Order to DME for CPAP download and headstrap for nasal pillows.  Continue on CPAP At bedtime   Goal is at least 4-6 hrs each night.  Do not drive if sleepy.  Follow up with Dr. Elsworth Soho  In 1 year and As needed

## 2015-11-26 NOTE — Progress Notes (Signed)
Subjective:    Patient ID: [name], male    DOB: 12/30/1950, 65 y.o.   MRN: [id number]  HPI  PCP - Randel Pigg  Cards >> Croitoru  EP- Ethlyn Daniels for FU of insomnia & severe obstructive sleep apnea  He has atrial fibrillation & non ischemic cardiomyopathy with EF 40% ( ? Tachycardia induced)  He has longstanding insomnia, witnessed apneas, loud snoring, excessive daytime fatigue, and non-refreshing and restless sleep.  He worked 3 rd shift x 6 yrs in past.      11/26/2015 Follow up : OSA  Pt returns for 1 year follow up for OSA.  Wear CPAP every night for 6hr . Never misses a night.  Continues to struggle with insomnia  -  Temazepam provides him up to 5-6hr .  For  anxiety - on lorazepam bid  Says he falls asleep in evenings. Would like to try to use nasal pillows. May use them if he takes a nap.  No recent CPAP download. Download request sent to DME , pt will take CPAP chip by the office.  He denies chest pain,orthopnea, edema or fever.   Significant tests/ events  PSG dec '11>> - weighed 225 pounds,BMI of 31, neck size of 14 inches Sleep was very fragmented due to frequent arousals and awakenings, especially in the second half of the night with CPAP. He was desensitized with a standard nasal mask. Note that he took 2 tablets of temazepam of 15 mg prior to the study. >>AHI was 42/h with lowest desatn of 86 % >>corrected by CPAP of 8 cm with a nasal mask. Download 12/28 -07/02/10 >> good compliance avg 6.5h, AHI 15/h, centrals 9/h, obstructive 4.5/h, avg pr 8 cm  Download on 10 cm 2/28-3/28/12 >> few residual events, some central apneas persist   PFT -> 01/2010 - TLC 77%, DLCO 56%, FVC 3.5L/72%, RAtio 77 ,FEV 1 78%   Past Medical History  Diagnosis Date  . Hypertension   . Hyperlipidemia   . Atrial fibrillation ([place])   . UNSPECIFIED ANEMIA 01/05/2010  . TESTICULAR HYPOFUNCTION 01/05/2010  . PULMONARY FUNCTION TESTS, ABNORMAL 02/02/2010  . PERIPHERAL NEUROPATHY 01/05/2010  .  OBESITY NOS 01/25/2007  . NEUROMA 01/05/2010  . INSOMNIA, HX OF 01/25/2007  . HYPERTENSION 01/25/2007  . FATIGUE, CHRONIC 11/11/2009  . ERECTILE DYSFUNCTION 01/25/2007  . DYSPNEA 12/19/2009  . CHF 12/05/2009  . CHEST PAIN-UNSPECIFIED 11/11/2009  . BENIGN PROSTATIC HYPERTROPHY, HX OF 12/05/2009  . Atrial flutter ([place]) 12/05/2009  . AORTIC STENOSIS, MODERATE 12/05/2009  . Knee pain, right 12/28/2010  . Hypoxia 05/05/2011  . Heart murmur   . SLEEP APNEA 04/06/2010    uses cpap  . DM 12/05/2009  . Superficial thrombophlebitis of left leg 09/07/2011  . Atypical chest pain 11/08/2011  . 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 Hyperkalemia 04/18/2012  . Edema 04/18/2012  . Debility 04/18/2012  . Toe pain, right 07/10/2012  . Gout 07/10/2012  . OSA (obstructive sleep apnea) 07/13/2014  . Knee pain, bilateral 12/28/2010   Current Outpatient Prescriptions on File Prior to Visit  Medication Sig Dispense Refill  . albuterol (VENTOLIN HFA) 108 (90 Base) MCG/ACT inhaler Inhale 2 puffs into the lungs every 6 (six) hours as needed for wheezing or shortness of breath. 1 Inhaler 6  . allopurinol (ZYLOPRIM) 300 MG tablet Take 1 tablet (300 mg total) by mouth daily. 90 tablet 2  . aspirin EC 81 MG tablet Take 81 mg by mouth daily.    [name]  benazepril (LOTENSIN) 40 MG tablet TAKE ONE TABLET BY MOUTH ONCE DAILY 90 tablet 0  . calcium carbonate (TUMS - DOSED IN MG ELEMENTAL CALCIUM) 500 MG chewable tablet Chew 1 tablet by mouth daily as needed. For heartburn    . cetirizine (ZYRTEC) 10 MG tablet Take 1 tablet (10 mg total) by mouth daily. 30 tablet 11  . Clobetasol Prop Emollient Base (CLOBETASOL PROPIONATE E) 0.05 % emollient cream Apply 1 application topically 2 (two) times daily. (Patient taking differently: Apply 1 application topically 2 (two) times daily as needed (insect bite). ) 60 g 0  . COLCRYS 0.6 MG tablet TAKE ONE TABLET BY MOUTH EVERY TWO HOURS FOR FOUR DOSES OR PAIN RELIEF TODAY. THEN TAKE ONE TABLET BY MOUTH ONCE DAILY (Patient  taking differently: TAKE ONE TABLET BY MOUTH DAILY AS NEEDED FOR GOUT FLARE UPS.) 35 tablet 0  . dofetilide (TIKOSYN) 500 MCG capsule Take 1 capsule (500 mcg total) by mouth 2 (two) times daily. 60 capsule 5  . fish oil-omega-3 fatty acids 1000 MG capsule Take 1 g by mouth daily.     . furosemide (LASIX) 40 MG tablet TAKE ONE TABLET BY MOUTH ONCE DAILY 90 tablet 3  . glyBURIDE (DIABETA) 5 MG tablet TAKE ONE TABLET BY MOUTH ONCE DAILY WITH BREAKFAST 90 tablet 0  . HYDROcodone-acetaminophen (NORCO) 10-325 MG tablet Take 1 tablet by mouth every 8 (eight) hours as needed for moderate pain or severe pain. 90 tablet 0  . LORazepam (ATIVAN) 1 MG tablet Take 1 tablet (1 mg total) by mouth 2 (two) times daily as needed. for anxiety 60 tablet 3  . magnesium oxide (MAG-OX) 400 MG tablet Take 1 tablet (400 mg total) by mouth daily. 30 tablet 11  . metFORMIN (GLUCOPHAGE) 1000 MG tablet Take 1 tablet (1,000 mg total) by mouth 2 (two) times daily with a meal. 180 tablet 1  . metoprolol (LOPRESSOR) 100 MG tablet TAKE ONE TABLET BY MOUTH TWICE DAILY 180 tablet 3  . Multiple Vitamins-Minerals (MULTIVITAMIN PO) Take 1 tablet by mouth daily.    . mupirocin ointment (BACTROBAN) 2 % Place 1 application into the nose 2 (two) times daily. 30 g 1  . naproxen (NAPROSYN) 375 MG tablet TAKE ONE TABLET BY MOUTH ONCE DAILY AS NEEDED FOR PAIN 30 tablet 4  . potassium chloride SA (KLOR-CON M20) 20 MEQ tablet Take 1 tablet (20 mEq total) by mouth daily. 90 tablet 3  . pravastatin (PRAVACHOL) 40 MG tablet TAKE ONE TABLET BY MOUTH ONCE DAILY 90 tablet 3  . temazepam (RESTORIL) 30 MG capsule TAKE ONE CAPSULE BY MOUTH AT BEDTIME AS NEEDED FOR SLEEP OR ANXIETY 30 capsule 3  . Testosterone 10 MG/ACT (2%) GEL Apply 1 application topically daily. Use as directed    . warfarin (COUMADIN) 4 MG tablet TAKE ONE TABLET BY MOUTH ONCE DAILY OR AS DIRECTED 90 tablet 1   No current facility-administered medications on file prior to visit.      Review of Systems neg for any significant sore throat, dysphagia, itching, sneezing, nasal congestion or excess/ purulent secretions, fever, chills, sweats, unintended wt loss, pleuritic or exertional cp, hempoptysis, orthopnea pnd or change in chronic leg swelling. Also denies presyncope, palpitations, heartburn, abdominal pain, nausea, vomiting, diarrhea or change in bowel or urinary habits, dysuria,hematuria, rash, arthralgias, visual complaints, headache, numbness weakness or ataxia.     Objective:   Physical Exam Filed Vitals:   11/26/15 0932  BP: 126/78  Pulse: 74  Temp: 98 F (36.7  C)  TempSrc: Oral  Height: 5\' 11"  (1.803 m)  Weight: 220 lb (99.791 kg)  SpO2: 100%   Body mass index is 30.7 kg/(m^2).  Gen. Pleasant, well-nourished, in no distress, obese  ENT - no lesions, no post nasal drip. Class 2-3 MP airway  Neck: No JVD, no thyromegaly, no carotid bruits Lungs: no use of accessory muscles, no dullness to percussion, clear without rales or rhonchi  Cardiovascular: Rhythm regular, heart sounds  normal, no murmurs or gallops, no peripheral edema Musculoskeletal: No deformities, no cyanosis or clubbing    Charmayne Odell NP-C  [facility]  11/26/2015

## 2015-11-26 NOTE — Patient Instructions (Signed)
Order to DME for CPAP download and headstrap for nasal pillows.  Continue on CPAP At bedtime   Goal is at least 4-6 hrs each night.  Do not drive if sleepy.  Follow up with Dr. Elsworth Soho  In 1 year and As needed

## 2015-11-26 NOTE — Progress Notes (Signed)
Patient ID: [name], male   DOB: 11-03-1950, 65 y.o.   MRN: [id number]   Subjective:    Patient ID: [name], male    DOB: 08-24-1950, 65 y.o.   MRN: [id number]  Chief Complaint  Patient presents with  . Follow-up    HPI Patient is in today for follow up. No recent hospitalization or acute illness. Does continue to struggle with shortness of breath even with mild exertion. No cough or fever. Did smoke 1 ppd but quit 35 years ago. Denies CP/palp/HA/congestion/fevers/GI or GU c/o. Taking meds as prescribed  Past Medical History  Diagnosis Date  . Hypertension   . Hyperlipidemia   . Atrial fibrillation ([place])   . UNSPECIFIED ANEMIA 01/05/2010  . TESTICULAR HYPOFUNCTION 01/05/2010  . PULMONARY FUNCTION TESTS, ABNORMAL 02/02/2010  . PERIPHERAL NEUROPATHY 01/05/2010  . OBESITY NOS 01/25/2007  . NEUROMA 01/05/2010  . INSOMNIA, HX OF 01/25/2007  . HYPERTENSION 01/25/2007  . FATIGUE, CHRONIC 11/11/2009  . ERECTILE DYSFUNCTION 01/25/2007  . DYSPNEA 12/19/2009  . CHF 12/05/2009  . CHEST PAIN-UNSPECIFIED 11/11/2009  . BENIGN PROSTATIC HYPERTROPHY, HX OF 12/05/2009  . Atrial flutter ([place]) 12/05/2009  . AORTIC STENOSIS, MODERATE 12/05/2009  . Knee pain, right 12/28/2010  . Hypoxia 05/05/2011  . Heart murmur   . SLEEP APNEA 04/06/2010    uses cpap  . DM 12/05/2009  . Superficial thrombophlebitis of left leg 09/07/2011  . Atypical chest pain 11/08/2011  . 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 Hyperkalemia 04/18/2012  . Edema 04/18/2012  . Debility 04/18/2012  . Toe pain, right 07/10/2012  . Gout 07/10/2012  . OSA (obstructive sleep apnea) 07/13/2014  . Knee pain, bilateral 12/28/2010    Past Surgical History  Procedure Laterality Date  . Bilat foot surgery removed part of 5th metatarsal to corect curvature of toes    . Cardiac catheterization  10/16/2009    nonischemic cardiomyopathy  . Cardioversion N/A 03/25/2015    Procedure: CARDIOVERSION;  Surgeon: Pixie Casino, MD;  Location: [facility] ENDOSCOPY;  Service:  Cardiovascular;  Laterality: N/A;    Family History  Problem Relation Age of Onset  . Clotting disorder Mother   . Heart disease Mother     s/p MI  . Heart attack Mother   . Hypertension Mother   . Diabetes Mother   . Hyperlipidemia Mother   . Stroke Mother   . Cancer Father     ? lung  . Lung disease Father     smoker  . Diabetes Sister   . Hypertension Sister     smoker  . Leukemia Maternal Grandmother     ?  [name] Aneurysm Sister     brain  . Other Sister     clipped  . Seizures Sister     d/o w/aneurysm/ smoker    Social History   Social History  . Marital Status: Married    Spouse Name: N/A  . Number of Children: N/A  . Years of Education: N/A   Occupational History  . Not on file.   Social History Main Topics  . Smoking status: Former Smoker -- 1.00 packs/day for 10 years    Quit date: 06/07/1980  . Smokeless tobacco: Never Used  . Alcohol Use: No  . Drug Use: No  . Sexual Activity: Not Currently   Other Topics Concern  . Not on file   Social History Narrative    Outpatient Prescriptions Prior to Visit  Medication Sig Dispense Refill  . albuterol (VENTOLIN HFA)  108 (90 Base) MCG/ACT inhaler Inhale 2 puffs into the lungs every 6 (six) hours as needed for wheezing or shortness of breath. 1 Inhaler 6  . allopurinol (ZYLOPRIM) 300 MG tablet Take 1 tablet (300 mg total) by mouth daily. 90 tablet 2  . aspirin EC 81 MG tablet Take 81 mg by mouth daily.    . benazepril (LOTENSIN) 40 MG tablet TAKE ONE TABLET BY MOUTH ONCE DAILY 90 tablet 0  . calcium carbonate (TUMS - DOSED IN MG ELEMENTAL CALCIUM) 500 MG chewable tablet Chew 1 tablet by mouth daily as needed. For heartburn    . cetirizine (ZYRTEC) 10 MG tablet Take 1 tablet (10 mg total) by mouth daily. 30 tablet 11  . Clobetasol Prop Emollient Base (CLOBETASOL PROPIONATE E) 0.05 % emollient cream Apply 1 application topically 2 (two) times daily. (Patient taking differently: Apply 1 application topically 2  (two) times daily as needed (insect bite). ) 60 g 0  . COLCRYS 0.6 MG tablet TAKE ONE TABLET BY MOUTH EVERY TWO HOURS FOR FOUR DOSES OR PAIN RELIEF TODAY. THEN TAKE ONE TABLET BY MOUTH ONCE DAILY (Patient taking differently: TAKE ONE TABLET BY MOUTH DAILY AS NEEDED FOR GOUT FLARE UPS.) 35 tablet 0  . dofetilide (TIKOSYN) 500 MCG capsule Take 1 capsule (500 mcg total) by mouth 2 (two) times daily. 60 capsule 5  . fish oil-omega-3 fatty acids 1000 MG capsule Take 1 g by mouth daily.     . furosemide (LASIX) 40 MG tablet TAKE ONE TABLET BY MOUTH ONCE DAILY 90 tablet 3  . glyBURIDE (DIABETA) 5 MG tablet TAKE ONE TABLET BY MOUTH ONCE DAILY WITH BREAKFAST 90 tablet 0  . magnesium oxide (MAG-OX) 400 MG tablet Take 1 tablet (400 mg total) by mouth daily. 30 tablet 11  . metFORMIN (GLUCOPHAGE) 1000 MG tablet Take 1 tablet (1,000 mg total) by mouth 2 (two) times daily with a meal. 180 tablet 1  . metoprolol (LOPRESSOR) 100 MG tablet TAKE ONE TABLET BY MOUTH TWICE DAILY 180 tablet 3  . Multiple Vitamins-Minerals (MULTIVITAMIN PO) Take 1 tablet by mouth daily.    . mupirocin ointment (BACTROBAN) 2 % Place 1 application into the nose 2 (two) times daily. 30 g 1  . naproxen (NAPROSYN) 375 MG tablet TAKE ONE TABLET BY MOUTH ONCE DAILY AS NEEDED FOR PAIN 30 tablet 4  . potassium chloride SA (KLOR-CON M20) 20 MEQ tablet Take 1 tablet (20 mEq total) by mouth daily. 90 tablet 3  . pravastatin (PRAVACHOL) 40 MG tablet TAKE ONE TABLET BY MOUTH ONCE DAILY 90 tablet 3  . Testosterone 10 MG/ACT (2%) GEL Apply 1 application topically daily. Use as directed    . warfarin (COUMADIN) 4 MG tablet TAKE ONE TABLET BY MOUTH ONCE DAILY OR AS DIRECTED 90 tablet 1  . HYDROcodone-acetaminophen (NORCO) 10-325 MG tablet Take 1 tablet by mouth every 8 (eight) hours as needed for moderate pain or severe pain. 90 tablet 0  . LORazepam (ATIVAN) 1 MG tablet TAKE ONE TABLET BY MOUTH TWICE DAILY AS NEEDED FOR ANXIETY 60 tablet 0  . temazepam  (RESTORIL) 30 MG capsule TAKE ONE CAPSULE BY MOUTH AT BEDTIME AS NEEDED FOR SLEEP OR ANXIETY 30 capsule 0   No facility-administered medications prior to visit.    Allergies  Allergen Reactions  . Sulfonamide Derivatives Other (See Comments)    unknown reaction.  Patient states had to go to the ER.   [name] Zolpidem     Excessive, prolonged sedation  Review of Systems  Constitutional: Positive for malaise/fatigue. Negative for fever.  HENT: Negative for congestion.   Eyes: Negative for blurred vision.  Respiratory: Positive for shortness of breath.   Cardiovascular: Negative for chest pain, palpitations and leg swelling.  Gastrointestinal: Negative for nausea, abdominal pain and blood in stool.  Genitourinary: Negative for dysuria and frequency.  Musculoskeletal: Negative for falls.  Skin: Negative for rash.  Neurological: Negative for dizziness, loss of consciousness and headaches.  Endo/Heme/Allergies: Negative for environmental allergies.  Psychiatric/Behavioral: Negative for depression. The patient is not nervous/anxious.        Objective:    Physical Exam  Constitutional: He is oriented to person, place, and time. He appears well-developed and well-nourished. No distress.  HENT:  Head: Normocephalic and atraumatic.  Nose: Nose normal.  Eyes: Right eye exhibits no discharge. Left eye exhibits no discharge.  Neck: Normal range of motion. Neck supple.  Cardiovascular: Normal rate and regular rhythm.   Murmur heard. Pulmonary/Chest: Effort normal and breath sounds normal.  Abdominal: Soft. Bowel sounds are normal. There is no tenderness.  Musculoskeletal: He exhibits no edema.  Neurological: He is alert and oriented to person, place, and time.  Skin: Skin is warm and dry.  Psychiatric: He has a normal mood and affect.  Nursing note and vitals reviewed.   BP 110/72 mmHg  Pulse 75  Temp(Src) 98.5 F (36.9 C) (Oral)  Ht 5\' 11"  (1.803 m)  Wt 221 lb 4 oz (100.358 kg)   BMI 30.87 kg/m2  SpO2 98% Wt Readings from Last 3 Encounters:  11/26/15 220 lb (99.791 kg)  11/17/15 221 lb 4 oz (100.358 kg)  07/15/15 227 lb 6 oz (103.137 kg)     Lab Results  Component Value Date   WBC 4.6 11/17/2015   HGB 15.0 11/17/2015   HCT 47.1 11/17/2015   PLT 214.0 11/17/2015   GLUCOSE 148* 11/17/2015   CHOL 133 11/17/2015   TRIG 112.0 11/17/2015   HDL 28.10* 11/17/2015   LDLCALC 83 11/17/2015   ALT 12 11/17/2015   AST 16 11/17/2015   NA 140 11/17/2015   K 4.6 11/17/2015   CL 106 11/17/2015   CREATININE 1.23 11/17/2015   BUN 22 11/17/2015   CO2 26 11/17/2015   TSH 2.11 11/17/2015   PSA 4.26* 12/29/2009   INR 3.1 11/26/2015   HGBA1C 6.4 11/17/2015   MICROALBUR 1.1 04/14/2015    Lab Results  Component Value Date   TSH 2.11 11/17/2015   Lab Results  Component Value Date   WBC 4.6 11/17/2015   HGB 15.0 11/17/2015   HCT 47.1 11/17/2015   MCV 79.5 11/17/2015   PLT 214.0 11/17/2015   Lab Results  Component Value Date   NA 140 11/17/2015   K 4.6 11/17/2015   CO2 26 11/17/2015   GLUCOSE 148* 11/17/2015   BUN 22 11/17/2015   CREATININE 1.23 11/17/2015   BILITOT 0.9 11/17/2015   ALKPHOS 37* 11/17/2015   AST 16 11/17/2015   ALT 12 11/17/2015   PROT 7.4 11/17/2015   ALBUMIN 4.2 11/17/2015   CALCIUM 9.5 11/17/2015   GFR 76.47 11/17/2015   Lab Results  Component Value Date   CHOL 133 11/17/2015   Lab Results  Component Value Date   HDL 28.10* 11/17/2015   Lab Results  Component Value Date   LDLCALC 83 11/17/2015   Lab Results  Component Value Date   TRIG 112.0 11/17/2015   Lab Results  Component Value Date   CHOLHDL 5 11/17/2015  Lab Results  Component Value Date   HGBA1C 6.4 11/17/2015       Assessment & Plan:   Problem List Items Addressed This Visit    OSA (obstructive sleep apnea)    wearing CPAP nightly, last Sleep study 2011. Waking up tired and feeling more SOB recently.       Relevant Orders   TSH (Completed)   CBC  (Completed)   Lipid panel (Completed)   Comprehensive metabolic panel (Completed)   Hemoglobin A1c (Completed)   Uric acid (Completed)   Magnesium (Completed)   TSH   CBC   Lipid panel   Comprehensive metabolic panel   Hemoglobin A1c   Uric acid   Obesity - Primary    Encouraged DASH diet, decrease po intake and increase exercise as tolerated. Needs 7-8 hours of sleep nightly. Avoid trans fats, eat small, frequent meals every 4-5 hours with lean proteins, complex carbs and healthy fats. Minimize simple carbs, GMO foods.      Relevant Orders   TSH (Completed)   CBC (Completed)   Lipid panel (Completed)   Comprehensive metabolic panel (Completed)   Hemoglobin A1c (Completed)   Uric acid (Completed)   Magnesium (Completed)   TSH   CBC   Lipid panel   Comprehensive metabolic panel   Hemoglobin A1c   Uric acid   Long term (current) use of anticoagulants    Tolerating Warfarin, no changes      Relevant Orders   TSH (Completed)   CBC (Completed)   Lipid panel (Completed)   Comprehensive metabolic panel (Completed)   Hemoglobin A1c (Completed)   Uric acid (Completed)   Magnesium (Completed)   TSH   CBC   Lipid panel   Comprehensive metabolic panel   Hemoglobin A1c   Uric acid   Hypoxia    Pulse ox at home was 94%. Checks at home.       Relevant Orders   Magnesium (Completed)   TSH   CBC   Lipid panel   Comprehensive metabolic panel   Hemoglobin A1c   Uric acid   Hyperlipidemia    Tolerating statin, encouraged heart healthy diet, avoid trans fats, minimize simple carbs and saturated fats. Increase exercise as tolerated      Relevant Orders   TSH (Completed)   CBC (Completed)   Lipid panel (Completed)   Comprehensive metabolic panel (Completed)   Hemoglobin A1c (Completed)   Uric acid (Completed)   Magnesium (Completed)   TSH   CBC   Lipid panel   Comprehensive metabolic panel   Hemoglobin A1c   Uric acid   Gout   Relevant Orders   TSH (Completed)    CBC (Completed)   Lipid panel (Completed)   Comprehensive metabolic panel (Completed)   Hemoglobin A1c (Completed)   Uric acid (Completed)   Magnesium (Completed)   TSH   CBC   Lipid panel   Comprehensive metabolic panel   Hemoglobin A1c   Uric acid   Essential hypertension    Well controlled, no changes to meds. Encouraged heart healthy diet such as the DASH diet and exercise as tolerated.       Relevant Orders   TSH (Completed)   CBC (Completed)   Lipid panel (Completed)   Comprehensive metabolic panel (Completed)   Hemoglobin A1c (Completed)   Uric acid (Completed)   Magnesium (Completed)   TSH   CBC   Lipid panel   Comprehensive metabolic panel   Hemoglobin A1c   Uric acid  Diabetes mellitus type 2 in obese (HCC)    hgba1c acceptable, minimize simple carbs. Increase exercise as tolerated. Continue current meds      Relevant Orders   TSH (Completed)   CBC (Completed)   Lipid panel (Completed)   Comprehensive metabolic panel (Completed)   Hemoglobin A1c (Completed)   Uric acid (Completed)   Magnesium (Completed)   TSH   CBC   Lipid panel   Comprehensive metabolic panel   Hemoglobin A1c   Uric acid   Debility    Struggling with multifactorial causes including cardiac and pulmonology      Relevant Orders   TSH (Completed)   CBC (Completed)   Lipid panel (Completed)   Comprehensive metabolic panel (Completed)   Hemoglobin A1c (Completed)   Uric acid (Completed)   Magnesium (Completed)   TSH   CBC   Lipid panel   Comprehensive metabolic panel   Hemoglobin A1c   Uric acid      I have changed Mr. Torrence LORazepam. I am also having him maintain his fish oil-omega-3 fatty acids, calcium carbonate, aspirin EC, Multiple Vitamins-Minerals (MULTIVITAMIN PO), COLCRYS, naproxen, Testosterone, mupirocin ointment, cetirizine, Clobetasol Prop Emollient Base, warfarin, magnesium oxide, allopurinol, dofetilide, albuterol, metoprolol, furosemide, pravastatin,  potassium chloride SA, benazepril, glyBURIDE, metFORMIN, HYDROcodone-acetaminophen, and temazepam.  Meds ordered this encounter  Medications  . HYDROcodone-acetaminophen (NORCO) 10-325 MG tablet    Sig: Take 1 tablet by mouth every 8 (eight) hours as needed for moderate pain or severe pain.    Dispense:  90 tablet    Refill:  0  . LORazepam (ATIVAN) 1 MG tablet    Sig: Take 1 tablet (1 mg total) by mouth 2 (two) times daily as needed. for anxiety    Dispense:  60 tablet    Refill:  3  . temazepam (RESTORIL) 30 MG capsule    Sig: TAKE ONE CAPSULE BY MOUTH AT BEDTIME AS NEEDED FOR SLEEP OR ANXIETY    Dispense:  30 capsule    Refill:  3     Penni Homans, MD

## 2015-11-26 NOTE — Assessment & Plan Note (Signed)
Wt loss encouarged  

## 2015-11-26 NOTE — Addendum Note (Signed)
Addended by: Osa Craver on: 11/26/2015 10:28 AM   Modules accepted: Orders

## 2015-11-26 NOTE — Assessment & Plan Note (Signed)
hgba1c acceptable, minimize simple carbs. Increase exercise as tolerated. Continue current meds 

## 2015-11-27 DIAGNOSIS — N5201 Erectile dysfunction due to arterial insufficiency: Secondary | ICD-10-CM | POA: Diagnosis not present

## 2015-11-27 DIAGNOSIS — E291 Testicular hypofunction: Secondary | ICD-10-CM | POA: Diagnosis not present

## 2015-11-27 DIAGNOSIS — R972 Elevated prostate specific antigen [PSA]: Secondary | ICD-10-CM | POA: Diagnosis not present

## 2015-12-01 NOTE — Progress Notes (Signed)
Reviewed & agree with plan  

## 2015-12-11 ENCOUNTER — Ambulatory Visit (INDEPENDENT_AMBULATORY_CARE_PROVIDER_SITE_OTHER): Payer: Medicare Other | Admitting: Pharmacist Clinician (PhC)/ Clinical Pharmacy Specialist

## 2015-12-11 ENCOUNTER — Ambulatory Visit (INDEPENDENT_AMBULATORY_CARE_PROVIDER_SITE_OTHER): Payer: Medicare Other | Admitting: Cardiovascular Disease

## 2015-12-11 ENCOUNTER — Encounter: Payer: Self-pay | Admitting: Cardiovascular Disease

## 2015-12-11 VITALS — BP 108/70 | HR 70 | Ht 71.0 in | Wt 218.0 lb

## 2015-12-11 DIAGNOSIS — I5032 Chronic diastolic (congestive) heart failure: Secondary | ICD-10-CM

## 2015-12-11 DIAGNOSIS — I480 Paroxysmal atrial fibrillation: Secondary | ICD-10-CM | POA: Diagnosis not present

## 2015-12-11 DIAGNOSIS — G4733 Obstructive sleep apnea (adult) (pediatric): Secondary | ICD-10-CM | POA: Diagnosis not present

## 2015-12-11 DIAGNOSIS — Z7901 Long term (current) use of anticoagulants: Secondary | ICD-10-CM | POA: Diagnosis not present

## 2015-12-11 DIAGNOSIS — I10 Essential (primary) hypertension: Secondary | ICD-10-CM

## 2015-12-11 DIAGNOSIS — E669 Obesity, unspecified: Secondary | ICD-10-CM

## 2015-12-11 DIAGNOSIS — I4891 Unspecified atrial fibrillation: Secondary | ICD-10-CM | POA: Diagnosis not present

## 2015-12-11 DIAGNOSIS — I351 Nonrheumatic aortic (valve) insufficiency: Secondary | ICD-10-CM

## 2015-12-11 LAB — POCT INR: INR: 2.1

## 2015-12-11 NOTE — Patient Instructions (Signed)
Medication Instructions: Dr Sallyanne Kuster recommends that you continue on your current medications as directed. Please refer to the Current Medication list given to you today.  Labwork: NONE ORDERED  Testing/Procedures: 1. Echocardiogram - Your physician has requested that you have an echocardiogram. Echocardiography is a painless test that uses sound waves to create images of your heart. It provides your doctor with information about the size and shape of your heart and how well your heart's chambers and valves are working. This procedure takes approximately one hour. There are no restrictions for this procedure. This will be done at our [facility] location - [address], [address].  Follow-up: Dr Sallyanne Kuster recommends that you schedule a follow-up appointment first available.  If you need a refill on your cardiac medications before your next appointment, please call your pharmacy.

## 2015-12-11 NOTE — Progress Notes (Signed)
Cardiology Office Note    Date:  12/11/2015   ID:  [name], DOB 1951-02-13, MRN [id number]  PCP:  Penni Homans, MD  Cardiologist:   Sanda Klein, MD   Chief Complaint  Patient presents with  . Follow-up  . Shortness of Breath    History of Present Illness:  [name] is a 65 y.o. male with aortic insufficiency, diastolic heart failure, paroxysmal atrial fibrillation and atrial flutter, obstructive sleep apnea, dyslipidemia in the setting of obesity and type 2 diabetes mellitus  Since his last appointment about 6 months ago he has noticed worsening exertional dyspnea, mostly NYHA functional class II, occasionally class III. At times he has unexplained episodes of brief shortness of breath at rest. He has not been aware of any palpitations. He denies focal neurological events and has not had lower extremity edema, syncope, bleeding problems, claudication or other cardiovascular complaints.  He is having some difficulty with sleep again. He recently had a visit with Tammy Parrett and had a download of his sleep apnea device. He was trying to use nasal pillows during the daytime, but his DME provider would not give him 2 different types of sleep apnea masks.  On physical exam his murmur of aortic insufficiency sounds much more prominent than it did at his last appointment.  His electrocardiogram shows normal sinus rhythm. The QTC interval is in the desirable range at 484 ms. He switched to generic dofetilide roughly 3 months ago without incident.  His echo 2014 showed that left ventricular systolic function is slightly depressed with an EF of 45-50%. He had moderate aortic insufficiency which appears to be secondary to mild dilatation of the aortic root. He has a long-standing history of severe hypertension which has not always been well treated. He also has long-standing history of obstructive sleep apnea which was also not treated until recently, but he is now 100%  CPAP-compliant. Coronary angiography 2011 did not show evidence of significant stenoses. He presented with typical atrial flutter in 2011 and has recurrent paroxysmal atrial fibrillation with a good response to treatment with dofetilide. Additional problems include obesity, type 2 diabetes mellitus, gout and androgen deficiency. He had a successful cardioversion on March 25 2015 for persistent atrial fibrillation.   Past Medical History  Diagnosis Date  . Hypertension   . Hyperlipidemia   . Atrial fibrillation ([place])   . UNSPECIFIED ANEMIA 01/05/2010  . TESTICULAR HYPOFUNCTION 01/05/2010  . PULMONARY FUNCTION TESTS, ABNORMAL 02/02/2010  . PERIPHERAL NEUROPATHY 01/05/2010  . OBESITY NOS 01/25/2007  . NEUROMA 01/05/2010  . INSOMNIA, HX OF 01/25/2007  . HYPERTENSION 01/25/2007  . FATIGUE, CHRONIC 11/11/2009  . ERECTILE DYSFUNCTION 01/25/2007  . DYSPNEA 12/19/2009  . CHF 12/05/2009  . CHEST PAIN-UNSPECIFIED 11/11/2009  . BENIGN PROSTATIC HYPERTROPHY, HX OF 12/05/2009  . Atrial flutter ([place]) 12/05/2009  . AORTIC STENOSIS, MODERATE 12/05/2009  . Knee pain, right 12/28/2010  . Hypoxia 05/05/2011  . Heart murmur   . SLEEP APNEA 04/06/2010    uses cpap  . DM 12/05/2009  . Superficial thrombophlebitis of left leg 09/07/2011  . Atypical chest pain 11/08/2011  . 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 Hyperkalemia 04/18/2012  . Edema 04/18/2012  . Debility 04/18/2012  . Toe pain, right 07/10/2012  . Gout 07/10/2012  . OSA (obstructive sleep apnea) 07/13/2014  . Knee pain, bilateral 12/28/2010    Past Surgical History  Procedure Laterality Date  . Bilat foot surgery removed part of 5th metatarsal to corect curvature of toes    .  Cardiac catheterization  10/16/2009    nonischemic cardiomyopathy  . Cardioversion N/A 03/25/2015    Procedure: CARDIOVERSION;  Surgeon: Pixie Casino, MD;  Location[facility] ENDOSCOPY;  Service: Cardiovascular;  Laterality: N/A;    Current Medications: Outpatient Prescriptions Prior to Visit  Medication  Sig Dispense Refill  . albuterol (VENTOLIN HFA) 108 (90 Base) MCG/ACT inhaler Inhale 2 puffs into the lungs every 6 (six) hours as needed for wheezing or shortness of breath. 1 Inhaler 6  . allopurinol (ZYLOPRIM) 300 MG tablet Take 1 tablet (300 mg total) by mouth daily. 90 tablet 2  . aspirin EC 81 MG tablet Take 81 mg by mouth daily.    . benazepril (LOTENSIN) 40 MG tablet TAKE ONE TABLET BY MOUTH ONCE DAILY 90 tablet 0  . calcium carbonate (TUMS - DOSED IN MG ELEMENTAL CALCIUM) 500 MG chewable tablet Chew 1 tablet by mouth daily as needed. For heartburn    . cetirizine (ZYRTEC) 10 MG tablet Take 1 tablet (10 mg total) by mouth daily. 30 tablet 11  . Clobetasol Prop Emollient Base (CLOBETASOL PROPIONATE E) 0.05 % emollient cream Apply 1 application topically 2 (two) times daily. (Patient taking differently: Apply 1 application topically 2 (two) times daily as needed (insect bite). ) 60 g 0  . COLCRYS 0.6 MG tablet TAKE ONE TABLET BY MOUTH EVERY TWO HOURS FOR FOUR DOSES OR PAIN RELIEF TODAY. THEN TAKE ONE TABLET BY MOUTH ONCE DAILY (Patient taking differently: TAKE ONE TABLET BY MOUTH DAILY AS NEEDED FOR GOUT FLARE UPS.) 35 tablet 0  . dofetilide (TIKOSYN) 500 MCG capsule Take 1 capsule (500 mcg total) by mouth 2 (two) times daily. 60 capsule 5  . fish oil-omega-3 fatty acids 1000 MG capsule Take 1 g by mouth daily.     . furosemide (LASIX) 40 MG tablet TAKE ONE TABLET BY MOUTH ONCE DAILY 90 tablet 3  . glyBURIDE (DIABETA) 5 MG tablet TAKE ONE TABLET BY MOUTH ONCE DAILY WITH BREAKFAST 90 tablet 0  . HYDROcodone-acetaminophen (NORCO) 10-325 MG tablet Take 1 tablet by mouth every 8 (eight) hours as needed for moderate pain or severe pain. 90 tablet 0  . LORazepam (ATIVAN) 1 MG tablet Take 1 tablet (1 mg total) by mouth 2 (two) times daily as needed. for anxiety 60 tablet 3  . magnesium oxide (MAG-OX) 400 MG tablet Take 1 tablet (400 mg total) by mouth daily. 30 tablet 11  . metFORMIN (GLUCOPHAGE) 1000  MG tablet Take 1 tablet (1,000 mg total) by mouth 2 (two) times daily with a meal. 180 tablet 1  . metoprolol (LOPRESSOR) 100 MG tablet TAKE ONE TABLET BY MOUTH TWICE DAILY 180 tablet 3  . Multiple Vitamins-Minerals (MULTIVITAMIN PO) Take 1 tablet by mouth daily.    . mupirocin ointment (BACTROBAN) 2 % Place 1 application into the nose 2 (two) times daily. 30 g 1  . naproxen (NAPROSYN) 375 MG tablet TAKE ONE TABLET BY MOUTH ONCE DAILY AS NEEDED FOR PAIN 30 tablet 4  . potassium chloride SA (KLOR-CON M20) 20 MEQ tablet Take 1 tablet (20 mEq total) by mouth daily. 90 tablet 3  . pravastatin (PRAVACHOL) 40 MG tablet TAKE ONE TABLET BY MOUTH ONCE DAILY 90 tablet 3  . temazepam (RESTORIL) 30 MG capsule TAKE ONE CAPSULE BY MOUTH AT BEDTIME AS NEEDED FOR SLEEP OR ANXIETY 30 capsule 3  . Testosterone 10 MG/ACT (2%) GEL Apply 1 application topically daily. Use as directed    . warfarin (COUMADIN) 4 MG tablet TAKE  ONE TABLET BY MOUTH ONCE DAILY OR AS DIRECTED 90 tablet 1   No facility-administered medications prior to visit.     Allergies:   Sulfonamide derivatives and Zolpidem   Social History   Social History  . Marital Status: Married    Spouse Name: N/A  . Number of Children: N/A  . Years of Education: N/A   Social History Main Topics  . Smoking status: Former Smoker -- 1.00 packs/day for 10 years    Quit date: 06/07/1980  . Smokeless tobacco: Never Used  . Alcohol Use: No  . Drug Use: No  . Sexual Activity: Not Currently   Other Topics Concern  . None   Social History Narrative     Family History:  The patient's family history includes Aneurysm in his sister; Cancer in his father; Clotting disorder in his mother; Diabetes in his mother and sister; Heart attack in his mother; Heart disease in his mother; Hyperlipidemia in his mother; Hypertension in his mother and sister; Leukemia in his maternal grandmother; Lung disease in his father; Other in his sister; Seizures in his sister;  Stroke in his mother.   ROS:   Please see the history of present illness.    ROS All other systems reviewed and are negative.   PHYSICAL EXAM:   VS:  BP 108/70 mmHg  Pulse 70  Ht 5\' 11"  (1.803 m)  Wt 98.884 kg (218 lb)  BMI 30.42 kg/m2  SpO2 98%   GEN: Well nourished, well developed, in no acute distress HEENT: normal Neck: no JVD, carotid bruits, or masses Cardiac: RRR, loud S2; 3/6 decrescendo aortic insufficiency murmur heard best at the left upper sternal border, rubs, or gallops,no edema  Respiratory:  clear to auscultation bilaterally, normal work of breathing GI: soft, nontender, nondistended, + BS MS: no deformity or atrophy Skin: warm and dry, no rash Neuro:  Alert and Oriented x 3, Strength and sensation are intact Psych: euthymic mood, full affect  Wt Readings from Last 3 Encounters:  12/11/15 98.884 kg (218 lb)  11/26/15 99.791 kg (220 lb)  11/17/15 100.358 kg (221 lb 4 oz)      Studies/Labs Reviewed:   EKG:  EKG is ordered today.  The ekg ordered today demonstrates Sinus rhythm with mild first-degree AV block (214 ms), QTC 484 ms, nonspecific T-wave changes  Recent Labs: 11/17/2015: ALT 12; BUN 22; Creatinine, Ser 1.23; Hemoglobin 15.0; Magnesium 2.0; Platelets 214.0; Potassium 4.6; Sodium 140; TSH 2.11   Lipid Panel    Component Value Date/Time   CHOL 133 11/17/2015 1034   TRIG 112.0 11/17/2015 1034   HDL 28.10* 11/17/2015 1034   CHOLHDL 5 11/17/2015 1034   VLDL 22.4 11/17/2015 1034   [place] 83 11/17/2015 1034    Additional studies/ records that were reviewed today include:  Notes from pulmonary/sleep clinic    ASSESSMENT:    1. Moderate aortic insufficiency   2. Paroxysmal atrial fibrillation (HCC)      PLAN:  In order of problems listed above:  1. Chronic (predominantly diastolic) congestive heart failure  No recent need for increased diuretic therapy, but there is mild worsening of his exertional dyspnea. No evidence of hypervolemia  on physical exam He is reminded about the importance of sodium restriction, daily weight monitoring.   2. Atrial fibrillation Very good prevention since we started treatment with Tikosyn. He tolerates AF poorly, probably due to significant diastolic dysfunction. He had convincing evidence of tachycardia-related cardiomyopathy in 2011. He is at relatively high risk  of embolic events and should continue warfarin anticoagulation without interruption. Reviewed the risk of drug interactions with dofetilide and with warfarin.  3. Moderate aortic insufficiency  Unlikely a cause for his initial presentation with congestive heart failure, but seems much more prominent now on physical exam and may be causing worsening heart failure. Hypertension is likely the cause of his dilated aortic root, in turn the cause for his aortic valve insufficiency. The most important intervention is careful treatment of his high blood pressure. Repeat echo. Discussed possible need for right left heart catheterization and aortic valve surgery if the leak is now severe. Reviewed the risks and benefits of cardiac catheterization in detail.  4. SLEEP APNEA  Compliant with CPAP. Will try to help with the necessary changes in his sleep apnea equipment.  5. Obesity He has successfully lost 13 pounds over the last 6 months or so. Repeated the recommendation for weight loss, discussed calorie restriction and physical exercise. He cannot exercise a lot because of severe knee arthritis. In fact right knee replacement has been recommended.   6. Malignant hypertension Blood pressure with excellent control on current regimen   Medication Adjustments/Labs and Tests Ordered: Current medicines are reviewed at length with the patient today.  Concerns regarding medicines are outlined above.  Medication changes, Labs and Tests ordered today are listed in the Patient Instructions below. Patient Instructions  Medication Instructions: Dr  Sallyanne Kuster recommends that you continue on your current medications as directed. Please refer to the Current Medication list given to you today.  Labwork: NONE ORDERED  Testing/Procedures: 1. Echocardiogram - Your physician has requested that you have an echocardiogram. Echocardiography is a painless test that uses sound waves to create images of your heart. It provides your doctor with information about the size and shape of your heart and how well your heart's chambers and valves are working. This procedure takes approximately one hour. There are no restrictions for this procedure. This will be done at our [facility] location - [address].  Follow-up: Dr Sallyanne Kuster recommends that you schedule a follow-up appointment first available.  If you need a refill on your cardiac medications before your next appointment, please call your pharmacy.     Signed, Sanda Klein, MD  12/11/2015 1:35 PM    [facility] [address], [place], [place]  [address] Phone: [phone]; Fax: [phone]

## 2015-12-23 ENCOUNTER — Telehealth: Payer: Self-pay | Admitting: Pulmonary Disease

## 2015-12-23 DIAGNOSIS — G4733 Obstructive sleep apnea (adult) (pediatric): Secondary | ICD-10-CM

## 2015-12-23 NOTE — Telephone Encounter (Signed)
Download 11/2015 shows good usage of CPAP 10 cm however several central apneas 18/hour  -Please check another download now He central apneas persisted and would suggest repeat CPAP titration study

## 2015-12-24 ENCOUNTER — Ambulatory Visit (INDEPENDENT_AMBULATORY_CARE_PROVIDER_SITE_OTHER): Payer: Medicare Other | Admitting: Pharmacist

## 2015-12-24 DIAGNOSIS — I480 Paroxysmal atrial fibrillation: Secondary | ICD-10-CM | POA: Diagnosis not present

## 2015-12-24 DIAGNOSIS — Z7901 Long term (current) use of anticoagulants: Secondary | ICD-10-CM | POA: Diagnosis not present

## 2015-12-24 DIAGNOSIS — I4891 Unspecified atrial fibrillation: Secondary | ICD-10-CM

## 2015-12-24 LAB — POCT INR: INR: 2.1

## 2015-12-24 NOTE — Telephone Encounter (Signed)
Attempted to contact patient, left message for patient to return call.  Order entered for download.

## 2015-12-25 ENCOUNTER — Telehealth: Payer: Self-pay | Admitting: Pulmonary Disease

## 2015-12-25 NOTE — Telephone Encounter (Signed)
Called spoke with pt. Reviewed results and recs. He voiced understanding and had no further questions. Order has been placed for 1 month follow up. Nothing further needed at this time.

## 2015-12-25 NOTE — Telephone Encounter (Signed)
Pt returning call [phone]

## 2015-12-25 NOTE — Telephone Encounter (Signed)
Order was sent to [facility] for Nasal Pillows and Chin Strap on 11/26/15 and patient went by the [facility] office but was denied the order.  Sent message to [facility] to find out why this was denied and get patient his order ASAP.  Order for download has been placed as well and will determine if patient needs CPAP Titration Study once download has been received.  Need to get on the new mask as soon as possible so we can make sure the mask is helping. Awaiting Melissa's response. Hold in my box until done.

## 2015-12-27 ENCOUNTER — Other Ambulatory Visit: Payer: Self-pay | Admitting: Cardiovascular Disease

## 2015-12-30 ENCOUNTER — Encounter: Payer: Self-pay | Admitting: Adult Health

## 2015-12-31 ENCOUNTER — Telehealth: Payer: Self-pay

## 2015-12-31 ENCOUNTER — Ambulatory Visit (HOSPITAL_COMMUNITY): Payer: Medicare Other | Attending: Cardiovascular Disease

## 2015-12-31 ENCOUNTER — Other Ambulatory Visit (HOSPITAL_COMMUNITY): Payer: Self-pay

## 2015-12-31 DIAGNOSIS — I340 Nonrheumatic mitral (valve) insufficiency: Secondary | ICD-10-CM | POA: Insufficient documentation

## 2015-12-31 DIAGNOSIS — I509 Heart failure, unspecified: Secondary | ICD-10-CM | POA: Diagnosis not present

## 2015-12-31 DIAGNOSIS — G4733 Obstructive sleep apnea (adult) (pediatric): Secondary | ICD-10-CM | POA: Insufficient documentation

## 2015-12-31 DIAGNOSIS — I110 Hypertensive heart disease with heart failure: Secondary | ICD-10-CM | POA: Insufficient documentation

## 2015-12-31 DIAGNOSIS — E785 Hyperlipidemia, unspecified: Secondary | ICD-10-CM | POA: Insufficient documentation

## 2015-12-31 DIAGNOSIS — I480 Paroxysmal atrial fibrillation: Secondary | ICD-10-CM | POA: Diagnosis not present

## 2015-12-31 DIAGNOSIS — I351 Nonrheumatic aortic (valve) insufficiency: Secondary | ICD-10-CM | POA: Insufficient documentation

## 2015-12-31 MED ORDER — POTASSIUM CHLORIDE CRYS ER 20 MEQ PO TBCR: 40.0000 meq | EXTENDED_RELEASE_TABLET | Freq: Every day | ORAL | 3 refills | Status: DC

## 2015-12-31 MED ORDER — FUROSEMIDE 40 MG PO TABS: 80.0000 mg | ORAL_TABLET | Freq: Every day | ORAL | 3 refills | Status: DC

## 2015-12-31 NOTE — Telephone Encounter (Signed)
Sharyn Lull please advise if this download has been received. Thanks.

## 2015-12-31 NOTE — Telephone Encounter (Signed)
[place], CMA        This order was placed already. It looks like the supplies were sent out yesterday to the pt. Hopefully this will clear up everything. Please let me know if there is anything else. Thank you!     Nothing further needed.

## 2015-12-31 NOTE — Telephone Encounter (Signed)
-----   Message from Sanda Klein, MD sent at 12/31/2015  2:51 PM EDT ----- Normal heart pumping function. Aortic valve leak remains moderate, unchanged. Pulmonary HTN is moderate and on my review he seems to have too much fluid on board. I would recommend increasing furosemide to 80 mg daily and KCl to 40 mEq daily - call us promptly and start colcrys if he feels gout coming on.

## 2015-12-31 NOTE — Telephone Encounter (Signed)
Sent another message to [place]. Awaiting response.

## 2015-12-31 NOTE — Telephone Encounter (Signed)
Called patient with results. Patient verbalized understanding and agreed with plan.  Patient requested paper copy of echo report mailed to him.

## 2016-01-04 ENCOUNTER — Other Ambulatory Visit: Payer: Self-pay | Admitting: Cardiovascular Disease

## 2016-01-14 ENCOUNTER — Ambulatory Visit (INDEPENDENT_AMBULATORY_CARE_PROVIDER_SITE_OTHER): Payer: Medicare Other | Admitting: Pharmacist

## 2016-01-14 DIAGNOSIS — I4891 Unspecified atrial fibrillation: Secondary | ICD-10-CM

## 2016-01-14 DIAGNOSIS — Z7901 Long term (current) use of anticoagulants: Secondary | ICD-10-CM

## 2016-01-14 LAB — POCT INR: INR: 2.7

## 2016-01-14 MED ORDER — COLCHICINE 0.6 MG PO TABS: ORAL_TABLET | ORAL | 0 refills | Status: DC

## 2016-01-22 NOTE — Telephone Encounter (Signed)
Sent message to Jiles Crocker of [facility] to check for download on this patient.  Awaiting response.

## 2016-01-26 ENCOUNTER — Telehealth: Payer: Self-pay | Admitting: Pulmonary Disease

## 2016-01-26 DIAGNOSIS — G4733 Obstructive sleep apnea (adult) (pediatric): Secondary | ICD-10-CM

## 2016-01-26 NOTE — Telephone Encounter (Signed)
Download shows persistent high AHI ? [facility] Suggest rpt CPAP titration s tudy

## 2016-01-27 NOTE — Telephone Encounter (Signed)
Patient aware of Dr. Bari Mantis recommendations. CPAP Titration study ordered.

## 2016-02-01 ENCOUNTER — Other Ambulatory Visit: Payer: Self-pay | Admitting: Family Medicine

## 2016-02-04 ENCOUNTER — Ambulatory Visit (INDEPENDENT_AMBULATORY_CARE_PROVIDER_SITE_OTHER): Payer: Medicare Other | Admitting: Cardiovascular Disease

## 2016-02-04 ENCOUNTER — Ambulatory Visit (INDEPENDENT_AMBULATORY_CARE_PROVIDER_SITE_OTHER): Payer: Medicare Other | Admitting: Pharmacist Clinician (PhC)/ Clinical Pharmacy Specialist

## 2016-02-04 ENCOUNTER — Encounter: Payer: Self-pay | Admitting: Cardiovascular Disease

## 2016-02-04 ENCOUNTER — Other Ambulatory Visit: Payer: Self-pay

## 2016-02-04 VITALS — BP 108/65 | HR 63 | Ht 71.0 in | Wt 223.0 lb

## 2016-02-04 DIAGNOSIS — G4733 Obstructive sleep apnea (adult) (pediatric): Secondary | ICD-10-CM

## 2016-02-04 DIAGNOSIS — I4891 Unspecified atrial fibrillation: Secondary | ICD-10-CM

## 2016-02-04 DIAGNOSIS — Z7901 Long term (current) use of anticoagulants: Secondary | ICD-10-CM | POA: Diagnosis not present

## 2016-02-04 DIAGNOSIS — I4819 Other persistent atrial fibrillation: Secondary | ICD-10-CM

## 2016-02-04 DIAGNOSIS — I481 Persistent atrial fibrillation: Secondary | ICD-10-CM

## 2016-02-04 DIAGNOSIS — I5032 Chronic diastolic (congestive) heart failure: Secondary | ICD-10-CM

## 2016-02-04 DIAGNOSIS — I351 Nonrheumatic aortic (valve) insufficiency: Secondary | ICD-10-CM | POA: Diagnosis not present

## 2016-02-04 DIAGNOSIS — I10 Essential (primary) hypertension: Secondary | ICD-10-CM

## 2016-02-04 DIAGNOSIS — E669 Obesity, unspecified: Secondary | ICD-10-CM

## 2016-02-04 LAB — POCT INR: INR: 2.3

## 2016-02-04 NOTE — Patient Instructions (Addendum)
Please call with your weight on (or around) July 26th.  Dr Sallyanne Kuster recommends that you schedule a follow-up appointment in 3 months.  If you need a refill on your cardiac medications before your next appointment, please call your pharmacy.

## 2016-02-05 ENCOUNTER — Telehealth: Payer: Self-pay | Admitting: *Deleted

## 2016-02-05 MED ORDER — FUROSEMIDE 40 MG PO TABS: ORAL_TABLET | ORAL | 3 refills | Status: DC

## 2016-02-05 MED ORDER — POTASSIUM CHLORIDE CRYS ER 20 MEQ PO TBCR: EXTENDED_RELEASE_TABLET | ORAL | 3 refills | Status: DC

## 2016-02-05 NOTE — Telephone Encounter (Signed)
SPOKE TO PATIENT. PER DR CROITORU, WANTED TO KNOW WEIGHT FROM 7 26/17 PER PATIENT WEIGHT WAS 218 LBS, ON 12/31/15 TODAY'S WEIGHT 216.   DR Sanda Linger. PATIENT IS TO CONTINUE TO FOLLOW INSTRUCTION FROM APPOINTMENT ON 02/04/16.FUROSEMIDE  2 TABLETS (40 MG) DAILY, 2 (20MEQ) POTASSIUM UNTIL PATIENT REACH DRY WEIGHT THEN RETURN ONE TABLET DAILY OF BOTH MEDICATIONS. DRY WEIGHT SHOULD BE 210 LBS  PATIENT VERBALIZED UNDERSTANDING. NEW PRESCRIPTION - E-SENT TO [facility] FOR #90 DAY SUPPLY

## 2016-02-05 NOTE — Progress Notes (Signed)
Cardiology Office Note    Date:  02/05/2016   ID:  Kalup, Klimas September 16, 1950, MRN [id number]  PCP:  Penni Homans, MD  Cardiologist:   Sanda Klein, MD   Chief Complaint  Patient presents with  . Follow-up    History of Present Illness:  [name] is a 65 y.o. male with aortic insufficiency, diastolic heart failure, paroxysmal atrial fibrillation and atrial flutter, obstructive sleep apnea, dyslipidemia in the setting of obesity and type 2 diabetes mellitus.  He returns to discuss the findings on his most recent echocardiogram, performed on July 26. The echo shows normal left ventricular systolic function EF [id number] and he has moderate left ventricular hypertrophy and moderate to severely dilated left atrium, suspicious for diastolic dysfunction and elevated filling pressures (although no comment was made on diastolic function on the report.). He has moderate aortic insufficiency (unchanged). The estimated systolic PA pressure was around 60 mmHg.  After the echo was performed I called him and asked him to increase his diuretics. Unfortunately this has not led to any meaningful net diuresis, as far as I can tell. His weight today is actually higher than it was at his last appointment. Therefore, I'm not sure how to compare his current fluid status but the day of his echo.  Still remains free of leg edema and denies palpitations, syncope or angina pectoris. He has not had bleeding complications and denies new focal neurological complaints. His rhythm is regular today. He is on chronic treatment with Tikosyn.  His echo 2014 showed that left ventricular systolic function is slightly depressed with an EF of 45-50%. He had moderate aortic insufficiency which appears to be secondary to mild dilatation of the aortic root. He has a long-standing history of severe hypertension which has not always been well treated. He also has long-standing history of obstructive sleep apnea which was also  not treated until recently, but he is now 100% CPAP-compliant. Coronary angiography 2011 did not show evidence of significant stenoses. He presented with typical atrial flutter in 2011 and has recurrent paroxysmal atrial fibrillation with a good response to treatment with dofetilide. Additional problems include obesity, type 2 diabetes mellitus, gout and androgen deficiency. He had a successful cardioversion on March 25 2015 for persistent atrial fibrillation.   Past Medical History:  Diagnosis Date  . AORTIC STENOSIS, MODERATE 12/05/2009  . Atrial fibrillation ([place])   . Atrial flutter ([place]) 12/05/2009  . Atypical chest pain 11/08/2011  . BENIGN PROSTATIC HYPERTROPHY, HX OF 12/05/2009  . CHEST PAIN-UNSPECIFIED 11/11/2009  . CHF 12/05/2009  . Debility 04/18/2012  . DM 12/05/2009  . DYSPNEA 12/19/2009  . Edema 04/18/2012  . ERECTILE DYSFUNCTION 01/25/2007  . FATIGUE, CHRONIC 11/11/2009  . Gout 07/10/2012  . Heart murmur   . Hyperkalemia 04/18/2012  . Hyperlipidemia   . Hypertension   . HYPERTENSION 01/25/2007  . Hypoxia 05/05/2011  . INSOMNIA, HX OF 01/25/2007  . Knee pain, bilateral 12/28/2010  . Knee pain, right 12/28/2010  . NEUROMA 01/05/2010  . OBESITY NOS 01/25/2007  . OSA (obstructive sleep apnea) 07/13/2014  . PERIPHERAL NEUROPATHY 01/05/2010  . PULMONARY FUNCTION TESTS, ABNORMAL 02/02/2010  . SLEEP APNEA 04/06/2010   uses cpap  . Superficial thrombophlebitis of left leg 09/07/2011  . TESTICULAR HYPOFUNCTION 01/05/2010  . 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 Toe pain, right 07/10/2012  . UNSPECIFIED ANEMIA 01/05/2010    Past Surgical History:  Procedure Laterality Date  . bilat foot surgery removed part of 5th metatarsal to  corect curvature of toes    . CARDIAC CATHETERIZATION  10/16/2009   nonischemic cardiomyopathy  . CARDIOVERSION N/A 03/25/2015   Procedure: CARDIOVERSION;  Surgeon: Pixie Casino, MD;  Location: [facility] ENDOSCOPY;  Service: Cardiovascular;  Laterality: N/A;    Current  Medications: Outpatient Medications Prior to Visit  Medication Sig Dispense Refill  . albuterol (VENTOLIN HFA) 108 (90 Base) MCG/ACT inhaler Inhale 2 puffs into the lungs every 6 (six) hours as needed for wheezing or shortness of breath. 1 Inhaler 6  . allopurinol (ZYLOPRIM) 300 MG tablet Take 1 tablet (300 mg total) by mouth daily. 90 tablet 2  . aspirin EC 81 MG tablet Take 81 mg by mouth daily.    . benazepril (LOTENSIN) 40 MG tablet TAKE ONE TABLET BY MOUTH ONCE DAILY 90 tablet 3  . calcium carbonate (TUMS - DOSED IN MG ELEMENTAL CALCIUM) 500 MG chewable tablet Chew 1 tablet by mouth daily as needed. For heartburn    . cetirizine (ZYRTEC) 10 MG tablet Take 1 tablet (10 mg total) by mouth daily. 30 tablet 11  . Clobetasol Prop Emollient Base (CLOBETASOL PROPIONATE E) 0.05 % emollient cream Apply 1 application topically 2 (two) times daily. (Patient taking differently: Apply 1 application topically 2 (two) times daily as needed (insect bite). ) 60 g 0  . colchicine (COLCRYS) 0.6 MG tablet TAKE ONE TABLET BY MOUTH DAILY AS NEEDED FOR GOUT FLARE UPS. 30 tablet 0  . dofetilide (TIKOSYN) 500 MCG capsule Take 1 capsule (500 mcg total) by mouth 2 (two) times daily. 60 capsule 5  . fish oil-omega-3 fatty acids 1000 MG capsule Take 1 g by mouth daily.     . furosemide (LASIX) 40 MG tablet Take 2 tablets (80 mg total) by mouth daily. 180 tablet 3  . glyBURIDE (DIABETA) 5 MG tablet TAKE ONE TABLET BY MOUTH ONCE DAILY WITH BREAKFAST 90 tablet 0  . HYDROcodone-acetaminophen (NORCO) 10-325 MG tablet Take 1 tablet by mouth every 8 (eight) hours as needed for moderate pain or severe pain. 90 tablet 0  . LORazepam (ATIVAN) 1 MG tablet Take 1 tablet (1 mg total) by mouth 2 (two) times daily as needed. for anxiety 60 tablet 3  . magnesium oxide (MAG-OX) 400 MG tablet Take 1 tablet (400 mg total) by mouth daily. 30 tablet 11  . metFORMIN (GLUCOPHAGE) 1000 MG tablet Take 1 tablet (1,000 mg total) by mouth 2 (two)  times daily with a meal. 180 tablet 1  . metoprolol (LOPRESSOR) 100 MG tablet TAKE ONE TABLET BY MOUTH TWICE DAILY 180 tablet 3  . Multiple Vitamins-Minerals (MULTIVITAMIN PO) Take 1 tablet by mouth daily.    . mupirocin ointment (BACTROBAN) 2 % Place 1 application into the nose 2 (two) times daily. 30 g 1  . naproxen (NAPROSYN) 375 MG tablet TAKE ONE TABLET BY MOUTH ONCE DAILY AS NEEDED FOR PAIN 30 tablet 4  . potassium chloride SA (KLOR-CON M20) 20 MEQ tablet Take 2 tablets (40 mEq total) by mouth daily. 180 tablet 3  . pravastatin (PRAVACHOL) 40 MG tablet TAKE ONE TABLET BY MOUTH ONCE DAILY 90 tablet 3  . temazepam (RESTORIL) 30 MG capsule TAKE ONE CAPSULE BY MOUTH AT BEDTIME AS NEEDED FOR SLEEP OR ANXIETY 30 capsule 3  . Testosterone 10 MG/ACT (2%) GEL Apply 1 application topically daily. Use as directed    . warfarin (COUMADIN) 4 MG tablet TAKE ONE TABLET BY MOUTH ONCE DAILY OR AS DIRECTED 90 tablet 1   No facility-administered medications  prior to visit.      Allergies:   Sulfonamide derivatives and Zolpidem   Social History   Social History  . Marital status: Married    Spouse name: N/A  . Number of children: N/A  . Years of education: N/A   Social History Main Topics  . Smoking status: Former Smoker    Packs/day: 1.00    Years: 10.00    Quit date: 06/07/1980  . Smokeless tobacco: Never Used  . Alcohol use No  . Drug use: No  . Sexual activity: Not Currently   Other Topics Concern  . None   Social History Narrative  . None     Family History:  The patient's family history includes Aneurysm in his sister; Cancer in his father; Clotting disorder in his mother; Diabetes in his mother and sister; Heart attack in his mother; Heart disease in his mother; Hyperlipidemia in his mother; Hypertension in his mother and sister; Leukemia in his maternal grandmother; Lung disease in his father; Other in his sister; Seizures in his sister; Stroke in his mother.   ROS:   Please see  the history of present illness.    ROS All other systems reviewed and are negative.   PHYSICAL EXAM:   VS:  BP 108/65 (BP Location: Right Arm, Patient Position: Sitting, Cuff Size: Normal)   Pulse 63   Ht 5\' 11"  (1.803 m)   Wt 223 lb (101.2 kg)   SpO2 99%   BMI 31.10 kg/m    GEN: Well nourished, well developed, in no acute distress  HEENT: normal  Neck: no JVD, carotid bruits, or masses Cardiac: RRR, loud S2; 3/6 decrescendo aortic insufficiency murmur heard best at the left upper sternal border, rubs, or gallops,no edema  Respiratory:  clear to auscultation bilaterally, normal work of breathing GI: soft, nontender, nondistended, + BS MS: no deformity or atrophy  Skin: warm and dry, no rash Neuro:  Alert and Oriented x 3, Strength and sensation are intact Psych: euthymic mood, full affect  Wt Readings from Last 3 Encounters:  02/04/16 223 lb (101.2 kg)  12/11/15 218 lb (98.9 kg)  11/26/15 220 lb (99.8 kg)      Studies/Labs Reviewed:   EKG:  EKG is ordered today.  The ekg ordered today demonstrates Sinus rhythm with mild first-degree AV block (214 ms), QTC 484 ms, nonspecific T-wave changes  Recent Labs: 11/17/2015: ALT 12; BUN 22; Creatinine, Ser 1.23; Hemoglobin 15.0; Magnesium 2.0; Platelets 214.0; Potassium 4.6; Sodium 140; TSH 2.11   Lipid Panel    Component Value Date/Time   CHOL 133 11/17/2015 1034   TRIG 112.0 11/17/2015 1034   HDL 28.10 (L) 11/17/2015 1034   CHOLHDL 5 11/17/2015 1034   VLDL 22.4 11/17/2015 1034   LDLCALC 83 11/17/2015 1034    Additional studies/ records that were reviewed today include:  Notes from pulmonary/sleep clinic    ASSESSMENT:    1. Chronic diastolic congestive heart failure (HCC)   2. Persistent atrial fibrillation ([place])   3. Moderate aortic insufficiency   4. OSA (obstructive sleep apnea)   5. Obesity   6. Essential hypertension, malignant      PLAN:  In order of problems listed above:  1. Chronic (predominantly  diastolic) congestive heart failure  On the day of the echocardiogram he weighed 218 pounds on his home scale. He now weighs around 216 on his home scale. We'll shoot for an estimated "dry weight" of 210 pounds. Increased furosemide to 80 mg daily and  will increase potassium to 20 mEq twice daily also. Need to monitor potassium levels and renal function carefully. Recheck labs in a month.  2. Atrial fibrillation Very good prevention since we started treatment with Tikosyn. He tolerates AF poorly, probably due to significant diastolic dysfunction. He had convincing evidence of tachycardia-related cardiomyopathy in 2011. He is at relatively high risk of embolic events and should continue warfarin anticoagulation without interruption. Reviewed the risk of drug interactions with dofetilide and with warfarin.  3. Moderate aortic insufficiency  Result unchanged by current echo. Hypertension is likely the cause of his dilated aortic root, in turn the cause for his aortic valve insufficiency. The most important intervention is careful treatment of his high blood pressure. Discussed possible need for right left heart catheterization and aortic valve surgery if the leak becomessevere. Reviewed the risks and benefits of cardiac catheterization in detail.  4. SLEEP APNEA  Compliant with CPAP.   5. Obesity He has successfully lost 13 pounds over the last 6 months or so. May explain why we need to readjust his "dry weight". Repeated the recommendation for weight loss, discussed calorie restriction and physical exercise. He cannot exercise a lot because of severe knee arthritis. In fact right knee replacement has been recommended.   6. Malignant hypertension Blood pressure with excellent control on current regimen   Medication Adjustments/Labs and Tests Ordered: Current medicines are reviewed at length with the patient today.  Concerns regarding medicines are outlined above.  Medication changes, Labs and  Tests ordered today are listed in the Patient Instructions below. Patient Instructions  Please call with your weight on (or around) July 26th.  Dr Sallyanne Kuster recommends that you schedule a follow-up appointment in 3 months.  If you need a refill on your cardiac medications before your next appointment, please call your pharmacy.    Signed, Sanda Klein, MD  02/05/2016 4:37 PM    [address], [place], [place]  [address] Phone: [phone]; Fax: [phone]

## 2016-02-13 ENCOUNTER — Ambulatory Visit (HOSPITAL_BASED_OUTPATIENT_CLINIC_OR_DEPARTMENT_OTHER): Payer: Medicare Other | Attending: Pulmonary Disease | Admitting: Pulmonary Disease

## 2016-02-13 DIAGNOSIS — Z7901 Long term (current) use of anticoagulants: Secondary | ICD-10-CM | POA: Diagnosis not present

## 2016-02-13 DIAGNOSIS — Z7984 Long term (current) use of oral hypoglycemic drugs: Secondary | ICD-10-CM | POA: Insufficient documentation

## 2016-02-13 DIAGNOSIS — G4733 Obstructive sleep apnea (adult) (pediatric): Secondary | ICD-10-CM | POA: Insufficient documentation

## 2016-02-13 DIAGNOSIS — Z79899 Other long term (current) drug therapy: Secondary | ICD-10-CM | POA: Diagnosis not present

## 2016-02-16 ENCOUNTER — Telehealth: Payer: Self-pay | Admitting: Pulmonary Disease

## 2016-02-16 DIAGNOSIS — G4733 Obstructive sleep apnea (adult) (pediatric): Secondary | ICD-10-CM

## 2016-02-16 NOTE — Procedures (Signed)
Patient Name: [name], Or Date: 02/13/2016 Gender: Male D.O.B: 05/23/1951 Age (years): 68 Referring Provider: Kara Mead MD, ABSM Height (inches): 71 Interpreting Physician: Kara Mead MD, ABSM Weight (lbs): 217 RPSGT: Baxter Flattery BMI: 30 MRN: [id number] Neck Size: 18.00   CLINICAL INFORMATION The patient is referred for a CPAP titration to treat sleep apnea. His CPAP download showed persistent central events   SLEEP STUDY TECHNIQUE As per the AASM Manual for the Scoring of Sleep and Associated Events v2.3 (April 2016) with a hypopnea requiring 4% desaturations. The channels recorded and monitored were frontal, central and occipital EEG, electrooculogram (EOG), submentalis EMG (chin), nasal and oral airflow, thoracic and abdominal wall motion, anterior tibialis EMG, snore microphone, electrocardiogram, and pulse oximetry. Continuous positive airway pressure (CPAP) was initiated at the beginning of the study and titrated to treat sleep-disordered breathing.   MEDICATIONS Medications taken by the patient : ALLOPURINOL, TIKOSYN, METFORMIN, METOPROLOL, TEMAZEPAM, WARFARIN  Medications administered by patient during sleep study : Sleep medicine administered - Tamazepam at 09:30:09 AM   TECHNICIAN COMMENTS Comments added by technician: Patient was restless all through the night. Patient had difficulty initiating sleep.   RESPIRATORY PARAMETERS Optimal PAP Pressure (cm): 11 AHI at Optimal Pressure (/hr): N/A Overall Minimal O2 (%): 89.00 Supine % at Optimal Pressure (%): 0 Minimal O2 at Optimal Pressure (%): 79.00     SLEEP ARCHITECTURE The study was initiated at 11:02:21 PM and ended at 5:11:09 AM. Sleep onset time was 80.1 minutes and the sleep efficiency was 27.0%. The total sleep time was 99.5 minutes. The patient spent 35.18% of the night in stage N1 sleep, 64.82% in stage N2 sleep, 0.00% in stage N3 and 0.00% in REM.Stage REM latency was N/A minutes Wake after  sleep onset was 189.2. Alpha intrusion was absent. Supine sleep was 0.00%.   CARDIAC DATA The 2 lead EKG demonstrated sinus rhythm. The mean heart rate was 55.37 beats per minute. Other EKG findings include: None.   LEG MOVEMENT DATA The total Periodic Limb Movements of Sleep (PLMS) were 0. The PLMS index was 0.00. A PLMS index of <15 is considered normal in adults.   IMPRESSIONS - Very little sleep was noted during the study less than 100 minutes,Hence the results are to be interpreted with caution - The optimal PAP pressure was 9 cm of water. - Central sleep apnea was not noted during this titration (CAI = 0.0/h). - Mild oxygen desaturations were observed during this titration (min O2 = 89.00%). - No snoring was audible during this study. - No cardiac abnormalities were observed during this study. - Clinically significant periodic limb movements were not noted during this study. Arousals associated with PLMs were rare.    DIAGNOSIS - Obstructive Sleep Apnea (327.23 [G47.33 ICD-10])    RECOMMENDATIONS - Trial of CPAP therapy on 9 cm H2O with a Wide size Resmed Nasal Mask [place] FX mask and heated humidification. - Avoid alcohol, sedatives and other CNS depressants that may worsen sleep apnea and disrupt normal sleep architecture. - Sleep hygiene should be reviewed to assess factors that may improve sleep quality. - Weight management and regular exercise should be initiated or continued. - Return to [facility] Center for re-evaluation after 4 weeks of therapy   Kara Mead MD Diplomate, [place] [employer] -Sleep medicine

## 2016-02-16 NOTE — Telephone Encounter (Signed)
Unfortunately did not sleep very long during CPAP titration study, hence difficult to make recommendations. Suggest decrease CPAP to 9 cm-and recheck download and follow-up visit in 4 weeks

## 2016-02-18 ENCOUNTER — Telehealth: Payer: Self-pay | Admitting: Family Medicine

## 2016-02-18 ENCOUNTER — Other Ambulatory Visit (INDEPENDENT_AMBULATORY_CARE_PROVIDER_SITE_OTHER): Payer: Medicare Other

## 2016-02-18 DIAGNOSIS — E1169 Type 2 diabetes mellitus with other specified complication: Secondary | ICD-10-CM

## 2016-02-18 DIAGNOSIS — E669 Obesity, unspecified: Secondary | ICD-10-CM | POA: Diagnosis not present

## 2016-02-18 DIAGNOSIS — R0902 Hypoxemia: Secondary | ICD-10-CM

## 2016-02-18 DIAGNOSIS — M109 Gout, unspecified: Secondary | ICD-10-CM | POA: Diagnosis not present

## 2016-02-18 DIAGNOSIS — G4733 Obstructive sleep apnea (adult) (pediatric): Secondary | ICD-10-CM

## 2016-02-18 DIAGNOSIS — E785 Hyperlipidemia, unspecified: Secondary | ICD-10-CM | POA: Diagnosis not present

## 2016-02-18 DIAGNOSIS — Z7901 Long term (current) use of anticoagulants: Secondary | ICD-10-CM

## 2016-02-18 DIAGNOSIS — I10 Essential (primary) hypertension: Secondary | ICD-10-CM | POA: Diagnosis not present

## 2016-02-18 DIAGNOSIS — R252 Cramp and spasm: Secondary | ICD-10-CM | POA: Diagnosis not present

## 2016-02-18 DIAGNOSIS — E119 Type 2 diabetes mellitus without complications: Secondary | ICD-10-CM | POA: Diagnosis not present

## 2016-02-18 DIAGNOSIS — R5381 Other malaise: Secondary | ICD-10-CM

## 2016-02-18 LAB — CBC WITH DIFFERENTIAL/PLATELET
Basophils Relative: 0.5 % (ref 0.0–3.0)
Eosinophils Relative: 4.8 % (ref 0.0–5.0)
HCT: 45.2 % (ref 39.0–52.0)
Hemoglobin: 15 g/dL (ref 13.0–17.0)
LYMPHS PCT: 43.8 % (ref 12.0–46.0)
MCHC: 33.2 g/dL (ref 30.0–36.0)
MCV: 79 fl (ref 78.0–100.0)
MONOS PCT: 20 % — AB (ref 3.0–12.0)
Monocytes Absolute: 0.8 10*3/uL (ref 0.1–1.0)
NEUTROS PCT: 30.9 % — AB (ref 43.0–77.0)
PLATELETS: 201 10*3/uL (ref 150.0–400.0)
RBC: 5.72 Mil/uL (ref 4.22–5.81)
RDW: 17.8 % — AB (ref 11.5–15.5)
WBC: 4.1 10*3/uL (ref 4.0–10.5)

## 2016-02-18 LAB — LIPID PANEL
CHOL/HDL RATIO: 5
Cholesterol: 138 mg/dL (ref 0–200)
HDL: 30.5 mg/dL — AB (ref >39.00)
LDL Cholesterol: 86 mg/dL (ref 0–99)
NonHDL: 107.29
TRIGLYCERIDES: 108 mg/dL (ref 0.0–149.0)
VLDL: 21.6 mg/dL (ref 0.0–40.0)

## 2016-02-18 LAB — COMPREHENSIVE METABOLIC PANEL
ALK PHOS: 40 U/L (ref 39–117)
ALT: 13 U/L (ref 0–53)
AST: 16 U/L (ref 0–37)
Albumin: 4.1 g/dL (ref 3.5–5.2)
BILIRUBIN TOTAL: 0.5 mg/dL (ref 0.2–1.2)
BUN: 30 mg/dL — ABNORMAL HIGH (ref 6–23)
CALCIUM: 9.3 mg/dL (ref 8.4–10.5)
CO2: 24 mEq/L (ref 19–32)
Chloride: 110 mEq/L (ref 96–112)
Creatinine, Ser: 1.44 mg/dL (ref 0.40–1.50)
GFR: 63.7 mL/min (ref >60.00)
Glucose, Bld: 113 mg/dL — ABNORMAL HIGH (ref 70–99)
POTASSIUM: 5.4 meq/L — AB (ref 3.5–5.1)
Sodium: 144 mEq/L (ref 135–145)
TOTAL PROTEIN: 7.1 g/dL (ref 6.0–8.3)

## 2016-02-18 LAB — TSH: TSH: 1.35 u[IU]/mL (ref 0.35–4.50)

## 2016-02-18 LAB — MAGNESIUM: Magnesium: 2 mg/dL (ref 1.5–2.5)

## 2016-02-18 LAB — URIC ACID: Uric Acid, Serum: 5.8 mg/dL (ref 4.0–7.8)

## 2016-02-18 LAB — HEMOGLOBIN A1C: Hgb A1c MFr Bld: 6.7 % — ABNORMAL HIGH (ref 4.6–6.5)

## 2016-02-18 NOTE — Telephone Encounter (Signed)
Pt called in. He went into the [place] office to have labs completed.  He says that his magnesium was suppose to be checked also. Pt would like to be advised by provider.

## 2016-02-18 NOTE — Telephone Encounter (Signed)
Please advise if you would like to add on magnesium and I will fax add-on form.

## 2016-02-18 NOTE — Telephone Encounter (Signed)
Add on request faxed.

## 2016-02-18 NOTE — Telephone Encounter (Signed)
OK to add on magnesium level for muscle cramps

## 2016-02-19 ENCOUNTER — Ambulatory Visit (INDEPENDENT_AMBULATORY_CARE_PROVIDER_SITE_OTHER): Payer: Medicare Other | Admitting: Family Medicine

## 2016-02-19 ENCOUNTER — Encounter: Payer: Self-pay | Admitting: Family Medicine

## 2016-02-19 VITALS — BP 120/72 | HR 80 | Temp 98.5°F | Ht 71.0 in | Wt 223.1 lb

## 2016-02-19 DIAGNOSIS — R5382 Chronic fatigue, unspecified: Secondary | ICD-10-CM

## 2016-02-19 DIAGNOSIS — E785 Hyperlipidemia, unspecified: Secondary | ICD-10-CM

## 2016-02-19 DIAGNOSIS — I10 Essential (primary) hypertension: Secondary | ICD-10-CM

## 2016-02-19 DIAGNOSIS — H547 Unspecified visual loss: Secondary | ICD-10-CM

## 2016-02-19 DIAGNOSIS — E875 Hyperkalemia: Secondary | ICD-10-CM

## 2016-02-19 DIAGNOSIS — Z23 Encounter for immunization: Secondary | ICD-10-CM

## 2016-02-19 DIAGNOSIS — G4733 Obstructive sleep apnea (adult) (pediatric): Secondary | ICD-10-CM

## 2016-02-19 DIAGNOSIS — E119 Type 2 diabetes mellitus without complications: Secondary | ICD-10-CM | POA: Diagnosis not present

## 2016-02-19 DIAGNOSIS — I481 Persistent atrial fibrillation: Secondary | ICD-10-CM

## 2016-02-19 DIAGNOSIS — I5032 Chronic diastolic (congestive) heart failure: Secondary | ICD-10-CM | POA: Diagnosis not present

## 2016-02-19 DIAGNOSIS — E669 Obesity, unspecified: Secondary | ICD-10-CM

## 2016-02-19 DIAGNOSIS — E1169 Type 2 diabetes mellitus with other specified complication: Secondary | ICD-10-CM

## 2016-02-19 DIAGNOSIS — I4819 Other persistent atrial fibrillation: Secondary | ICD-10-CM

## 2016-02-19 DIAGNOSIS — M109 Gout, unspecified: Secondary | ICD-10-CM

## 2016-02-19 LAB — COMPREHENSIVE METABOLIC PANEL
ALK PHOS: 45 U/L (ref 39–117)
ALT: 15 U/L (ref 0–53)
AST: 18 U/L (ref 0–37)
Albumin: 4.2 g/dL (ref 3.5–5.2)
BILIRUBIN TOTAL: 0.4 mg/dL (ref 0.2–1.2)
BUN: 24 mg/dL — AB (ref 6–23)
CO2: 28 mEq/L (ref 19–32)
Calcium: 9.6 mg/dL (ref 8.4–10.5)
Chloride: 110 mEq/L (ref 96–112)
Creatinine, Ser: 1.28 mg/dL (ref 0.40–1.50)
GFR: 72.97 mL/min (ref >60.00)
GLUCOSE: 122 mg/dL — AB (ref 70–99)
Potassium: 5 mEq/L (ref 3.5–5.1)
SODIUM: 142 meq/L (ref 135–145)
TOTAL PROTEIN: 7.3 g/dL (ref 6.0–8.3)

## 2016-02-19 MED ORDER — TEMAZEPAM 30 MG PO CAPS: ORAL_CAPSULE | ORAL | 3 refills | Status: DC

## 2016-02-19 MED ORDER — LORAZEPAM 1 MG PO TABS: 1.0000 mg | ORAL_TABLET | Freq: Two times a day (BID) | ORAL | 3 refills | Status: DC | PRN

## 2016-02-19 MED ORDER — HYDROCODONE-ACETAMINOPHEN 10-325 MG PO TABS: 1.0000 | ORAL_TABLET | Freq: Three times a day (TID) | ORAL | 0 refills | Status: DC | PRN

## 2016-02-19 NOTE — Assessment & Plan Note (Signed)
Recent sleep study showed frequent wakening even on CPAP, pulmonology is adjusting CPAP due to 35 events an hour

## 2016-02-19 NOTE — Progress Notes (Signed)
Pre visit review using our clinic review tool, if applicable. No additional management support is needed unless otherwise documented below in the visit note. 

## 2016-02-19 NOTE — Assessment & Plan Note (Signed)
hgba1c acceptable, minimize simple carbs. Increase exercise as tolerated. Continue current meds 

## 2016-02-19 NOTE — Patient Instructions (Signed)
Change CPAP pressure to 9 cm and need an appt. To get that hanged and follow up with pulmonology with card in 1 month, call if no call by next week.  Basic Carbohydrate Counting for Diabetes Mellitus Carbohydrate counting is a method for keeping track of the amount of carbohydrates you eat. Eating carbohydrates naturally increases the level of sugar (glucose) in your blood, so it is important for you to know the amount that is okay for you to have in every meal. Carbohydrate counting helps keep the level of glucose in your blood within normal limits. The amount of carbohydrates allowed is different for every person. A dietitian can help you calculate the amount that is right for you. Once you know the amount of carbohydrates you can have, you can count the carbohydrates in the foods you want to eat. Carbohydrates are found in the following foods:  Grains, such as breads and cereals.  Dried beans and soy products.  Starchy vegetables, such as potatoes, peas, and corn.  Fruit and fruit juices.  Milk and yogurt.  Sweets and snack foods, such as cake, cookies, candy, chips, soft drinks, and fruit drinks. CARBOHYDRATE COUNTING There are two ways to count the carbohydrates in your food. You can use either of the methods or a combination of both. Reading the "Nutrition Facts" on [place] The "Nutrition Facts" is an area that is included on the labels of almost all packaged food and beverages in the [place]. It includes the serving size of that food or beverage and information about the nutrients in each serving of the food, including the grams (g) of carbohydrate per serving.  Decide the number of servings of this food or beverage that you will be able to eat or drink. Multiply that number of servings by the number of grams of carbohydrate that is listed on the label for that serving. The total will be the amount of carbohydrates you will be having when you eat or drink this food or  beverage. Learning Standard Serving Sizes of Food When you eat food that is not packaged or does not include "Nutrition Facts" on the label, you need to measure the servings in order to count the amount of carbohydrates.A serving of most carbohydrate-rich foods contains about 15 g of carbohydrates. The following list includes serving sizes of carbohydrate-rich foods that provide 15 g ofcarbohydrate per serving:   1 slice of bread (1 oz) or 1 six-inch tortilla.    of a hamburger bun or English muffin.  4-6 crackers.   cup unsweetened dry cereal.    cup hot cereal.   cup rice or pasta.    cup mashed potatoes or  of a large baked potato.  1 cup fresh fruit or one small piece of fruit.    cup canned or frozen fruit or fruit juice.  1 cup milk.   cup plain fat-free yogurt or yogurt sweetened with artificial sweeteners.   cup cooked dried beans or starchy vegetable, such as peas, corn, or potatoes.  Decide the number of standard-size servings that you will eat. Multiply that number of servings by 15 (the grams of carbohydrates in that serving). For example, if you eat 2 cups of strawberries, you will have eaten 2 servings and 30 g of carbohydrates (2 servings x 15 g = 30 g). For foods such as soups and casseroles, in which more than one food is mixed in, you will need to count the carbohydrates in each food that is included.  EXAMPLE OF CARBOHYDRATE COUNTING Sample Dinner  3 oz chicken breast.   cup of brown rice.   cup of corn.  1 cup milk.   1 cup strawberries with sugar-free whipped topping.  Carbohydrate Calculation Step 1: Identify the foods that contain carbohydrates:   Rice.   Corn.   Milk.   Strawberries. Step 2:Calculate the number of servings eaten of each:   2 servings of rice.   1 serving of corn.   1 serving of milk.   1 serving of strawberries. Step 3: Multiply each of those number of servings by 15 g:   2 servings of  rice x 15 g = 30 g.   1 serving of corn x 15 g = 15 g.   1 serving of milk x 15 g = 15 g.   1 serving of strawberries x 15 g = 15 g. Step 4: Add together all of the amounts to find the total grams of carbohydrates eaten: 30 g + 15 g + 15 g + 15 g = 75 g.   This information is not intended to replace advice given to you by your health care provider. Make sure you discuss any questions you have with your health care provider.   Document Released: 05/24/2005 Document Revised: 06/14/2014 Document Reviewed: 04/20/2013 Elsevier Interactive Patient Education [employer].

## 2016-02-19 NOTE — Assessment & Plan Note (Signed)
Well controlled, no changes to meds. Encouraged heart healthy diet such as the DASH diet and exercise as tolerated.  °

## 2016-02-19 NOTE — Assessment & Plan Note (Signed)
Doing well has an appt with cardiology

## 2016-02-23 NOTE — Telephone Encounter (Signed)
lmtcb X1 for pt  

## 2016-02-25 NOTE — Telephone Encounter (Signed)
Patient notified of CPAP Titration results. Order entered for change in pressure. Patient verbalized understanding. Nothing further needed.

## 2016-02-27 DIAGNOSIS — R972 Elevated prostate specific antigen [PSA]: Secondary | ICD-10-CM | POA: Diagnosis not present

## 2016-02-27 DIAGNOSIS — E291 Testicular hypofunction: Secondary | ICD-10-CM | POA: Diagnosis not present

## 2016-02-29 DIAGNOSIS — H547 Unspecified visual loss: Secondary | ICD-10-CM | POA: Insufficient documentation

## 2016-02-29 NOTE — Assessment & Plan Note (Signed)
Using CPAP nightly and following with pulmonology

## 2016-02-29 NOTE — Assessment & Plan Note (Signed)
Encouraged heart healthy diet, increase exercise, avoid trans fats, consider a krill oil cap daily 

## 2016-02-29 NOTE — Progress Notes (Signed)
Patient ID: [name], male   DOB: 06-20-1950, 65 y.o.   MRN: [id number]   Subjective:    Patient ID: [name], male    DOB: 1950-09-10, 66 y.o.   MRN: [id number]  Chief Complaint  Patient presents with  . Follow-up    HPI Patient is in today for folflow up. No recent illness. No recent hospitalization. Is following with pulmonology and using his CPAP nightly. Is following with cardiology . No recent changes to meds. Notes some visual changes and is in need of aan opthamologist. Denies CP/palp/HA/congestion/fevers/GI or GU c/o. Taking meds as prescribed  Past Medical History:  Diagnosis Date  . AORTIC STENOSIS, MODERATE 12/05/2009  . Atrial fibrillation ([place])   . Atrial flutter ([place]) 12/05/2009  . Atypical chest pain 11/08/2011  . BENIGN PROSTATIC HYPERTROPHY, HX OF 12/05/2009  . CHEST PAIN-UNSPECIFIED 11/11/2009  . CHF 12/05/2009  . Debility 04/18/2012  . DM 12/05/2009  . DYSPNEA 12/19/2009  . Edema 04/18/2012  . ERECTILE DYSFUNCTION 01/25/2007  . FATIGUE, CHRONIC 11/11/2009  . Gout 07/10/2012  . Heart murmur   . Hyperkalemia 04/18/2012  . Hyperlipidemia   . Hypertension   . HYPERTENSION 01/25/2007  . Hypoxia 05/05/2011  . INSOMNIA, HX OF 01/25/2007  . Knee pain, bilateral 12/28/2010  . Knee pain, right 12/28/2010  . NEUROMA 01/05/2010  . OBESITY NOS 01/25/2007  . OSA (obstructive sleep apnea) 07/13/2014  . PERIPHERAL NEUROPATHY 01/05/2010  . PULMONARY FUNCTION TESTS, ABNORMAL 02/02/2010  . SLEEP APNEA 04/06/2010   uses cpap  . Superficial thrombophlebitis of left leg 09/07/2011  . TESTICULAR HYPOFUNCTION 01/05/2010  . 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 Toe pain, right 07/10/2012  . UNSPECIFIED ANEMIA 01/05/2010    Past Surgical History:  Procedure Laterality Date  . bilat foot surgery removed part of 5th metatarsal to corect curvature of toes    . CARDIAC CATHETERIZATION  10/16/2009   nonischemic cardiomyopathy  . CARDIOVERSION N/A 03/25/2015   Procedure: CARDIOVERSION;  Surgeon: Pixie Casino, MD;  Location: [facility] ENDOSCOPY;  Service: Cardiovascular;  Laterality: N/A;    Family History  Problem Relation Age of Onset  . Clotting disorder Mother   . Heart disease Mother     s/p MI  . Heart attack Mother   . Hypertension Mother   . Diabetes Mother   . Hyperlipidemia Mother   . Stroke Mother   . Cancer Father     ? lung  . Lung disease Father     smoker  . Diabetes Sister   . Hypertension Sister     smoker  . Leukemia Maternal Grandmother     ?  [name] Aneurysm Sister     brain  . Other Sister     clipped  . Seizures Sister     d/o w/aneurysm/ smoker    Social History   Social History  . Marital status: Married    Spouse name: N/A  . Number of children: N/A  . Years of education: N/A   Occupational History  . Not on file.   Social History Main Topics  . Smoking status: Former Smoker    Packs/day: 1.00    Years: 10.00    Quit date: 06/07/1980  . Smokeless tobacco: Never Used  . Alcohol use No  . Drug use: No  . Sexual activity: Not Currently   Other Topics Concern  . Not on file   Social History Narrative  . No narrative on file    Outpatient Medications Prior  to Visit  Medication Sig Dispense Refill  . albuterol (VENTOLIN HFA) 108 (90 Base) MCG/ACT inhaler Inhale 2 puffs into the lungs every 6 (six) hours as needed for wheezing or shortness of breath. 1 Inhaler 6  . allopurinol (ZYLOPRIM) 300 MG tablet Take 1 tablet (300 mg total) by mouth daily. 90 tablet 2  . aspirin EC 81 MG tablet Take 81 mg by mouth daily.    . benazepril (LOTENSIN) 40 MG tablet TAKE ONE TABLET BY MOUTH ONCE DAILY 90 tablet 3  . calcium carbonate (TUMS - DOSED IN MG ELEMENTAL CALCIUM) 500 MG chewable tablet Chew 1 tablet by mouth daily as needed. For heartburn    . cetirizine (ZYRTEC) 10 MG tablet Take 1 tablet (10 mg total) by mouth daily. 30 tablet 11  . Clobetasol Prop Emollient Base (CLOBETASOL PROPIONATE E) 0.05 % emollient cream Apply 1 application topically 2 (two)  times daily. (Patient taking differently: Apply 1 application topically 2 (two) times daily as needed (insect bite). ) 60 g 0  . colchicine (COLCRYS) 0.6 MG tablet TAKE ONE TABLET BY MOUTH DAILY AS NEEDED FOR GOUT FLARE UPS. 30 tablet 0  . dofetilide (TIKOSYN) 500 MCG capsule Take 1 capsule (500 mcg total) by mouth 2 (two) times daily. 60 capsule 5  . fish oil-omega-3 fatty acids 1000 MG capsule Take 1 g by mouth daily.     . furosemide (LASIX) 40 MG tablet TAKE ONE TABLET 40 MG BY MOUTH DAILY AND EXTRA TABLET, IF WEIGHT IS ABOVE 210  LBS. 180 tablet 3  . glyBURIDE (DIABETA) 5 MG tablet TAKE ONE TABLET BY MOUTH ONCE DAILY WITH BREAKFAST 90 tablet 0  . magnesium oxide (MAG-OX) 400 MG tablet Take 1 tablet (400 mg total) by mouth daily. 30 tablet 11  . metFORMIN (GLUCOPHAGE) 1000 MG tablet Take 1 tablet (1,000 mg total) by mouth 2 (two) times daily with a meal. 180 tablet 1  . metoprolol (LOPRESSOR) 100 MG tablet TAKE ONE TABLET BY MOUTH TWICE DAILY 180 tablet 3  . Multiple Vitamins-Minerals (MULTIVITAMIN PO) Take 1 tablet by mouth daily.    . mupirocin ointment (BACTROBAN) 2 % Place 1 application into the nose 2 (two) times daily. 30 g 1  . naproxen (NAPROSYN) 375 MG tablet TAKE ONE TABLET BY MOUTH ONCE DAILY AS NEEDED FOR PAIN 30 tablet 4  . potassium chloride SA (K-DUR,KLOR-CON) 20 MEQ tablet TAKE ONE TABLET (20 MEQ) BY MOUTH DAILY AND EXTRA TABLET,  IF WEIGHT IS ABOVE 210  LBS. 180 tablet 3  . pravastatin (PRAVACHOL) 40 MG tablet TAKE ONE TABLET BY MOUTH ONCE DAILY 90 tablet 3  . Testosterone 10 MG/ACT (2%) GEL Apply 1 application topically daily. Use as directed    . warfarin (COUMADIN) 4 MG tablet TAKE ONE TABLET BY MOUTH ONCE DAILY OR AS DIRECTED 90 tablet 1  . HYDROcodone-acetaminophen (NORCO) 10-325 MG tablet Take 1 tablet by mouth every 8 (eight) hours as needed for moderate pain or severe pain. 90 tablet 0  . LORazepam (ATIVAN) 1 MG tablet Take 1 tablet (1 mg total) by mouth 2 (two) times  daily as needed. for anxiety 60 tablet 3  . temazepam (RESTORIL) 30 MG capsule TAKE ONE CAPSULE BY MOUTH AT BEDTIME AS NEEDED FOR SLEEP OR ANXIETY 30 capsule 3   No facility-administered medications prior to visit.     Allergies  Allergen Reactions  . Sulfonamide Derivatives Other (See Comments)    unknown reaction.  Patient states had to go to the  ER.   . Zolpidem     Excessive, prolonged sedation    Review of Systems  Constitutional: Positive for malaise/fatigue. Negative for fever.  HENT: Negative for congestion.   Eyes: Negative for double vision, photophobia, pain, discharge and redness.  Respiratory: Positive for shortness of breath.   Cardiovascular: Negative for chest pain, palpitations and leg swelling.  Gastrointestinal: Negative for abdominal pain, blood in stool and nausea.  Genitourinary: Negative for dysuria and frequency.  Musculoskeletal: Negative for falls.  Skin: Negative for rash.  Neurological: Negative for dizziness, loss of consciousness and headaches.  Endo/Heme/Allergies: Negative for environmental allergies.  Psychiatric/Behavioral: Negative for depression. The patient is not nervous/anxious.        Objective:    Physical Exam  BP 120/72 (BP Location: Right Arm, Patient Position: Sitting, Cuff Size: Large)   Pulse 80   Temp 98.5 F (36.9 C) (Oral)   Ht '5\' 11"'$  (1.803 m)   Wt 223 lb 2 oz (101.2 kg)   SpO2 96%   BMI 31.12 kg/m  Wt Readings from Last 3 Encounters:  02/19/16 223 lb 2 oz (101.2 kg)  02/13/16 217 lb (98.4 kg)  02/04/16 223 lb (101.2 kg)     Lab Results  Component Value Date   WBC 4.1 02/18/2016   HGB 15.0 02/18/2016   HCT 45.2 02/18/2016   PLT 201.0 02/18/2016   GLUCOSE 122 (H) 02/19/2016   CHOL 138 02/18/2016   TRIG 108.0 02/18/2016   HDL 30.50 (L) 02/18/2016   LDLCALC 86 02/18/2016   ALT 15 02/19/2016   AST 18 02/19/2016   NA 142 02/19/2016   K 5.0 02/19/2016   CL 110 02/19/2016   CREATININE 1.28 02/19/2016   BUN  24 (H) 02/19/2016   CO2 28 02/19/2016   TSH 1.35 02/18/2016   PSA 4.26 (H) 12/29/2009   INR 2.3 02/04/2016   HGBA1C 6.7 (H) 02/18/2016   MICROALBUR 1.1 04/14/2015    Lab Results  Component Value Date   TSH 1.35 02/18/2016   Lab Results  Component Value Date   WBC 4.1 02/18/2016   HGB 15.0 02/18/2016   HCT 45.2 02/18/2016   MCV 79.0 02/18/2016   PLT 201.0 02/18/2016   Lab Results  Component Value Date   NA 142 02/19/2016   K 5.0 02/19/2016   CO2 28 02/19/2016   GLUCOSE 122 (H) 02/19/2016   BUN 24 (H) 02/19/2016   CREATININE 1.28 02/19/2016   BILITOT 0.4 02/19/2016   ALKPHOS 45 02/19/2016   AST 18 02/19/2016   ALT 15 02/19/2016   PROT 7.3 02/19/2016   ALBUMIN 4.2 02/19/2016   CALCIUM 9.6 02/19/2016   GFR 72.97 02/19/2016   Lab Results  Component Value Date   CHOL 138 02/18/2016   Lab Results  Component Value Date   HDL 30.50 (L) 02/18/2016   Lab Results  Component Value Date   LDLCALC 86 02/18/2016   Lab Results  Component Value Date   TRIG 108.0 02/18/2016   Lab Results  Component Value Date   CHOLHDL 5 02/18/2016   Lab Results  Component Value Date   HGBA1C 6.7 (H) 02/18/2016       Assessment & Plan:   Problem List Items Addressed This Visit    Diabetes mellitus type 2 in obese ([place])    hgba1c acceptable, minimize simple carbs. Increase exercise as tolerated. Continue current meds      Relevant Orders   Ambulatory referral to Ophthalmology   Hemoglobin A1c   Obesity  Encouraged DASH diet, decrease po intake and increase exercise as tolerated. Needs 7-8 hours of sleep nightly. Avoid trans fats, eat small, frequent meals every 4-5 hours with lean proteins, complex carbs and healthy fats. Minimize simple carbs      Essential hypertension    Well controlled, no changes to meds. Encouraged heart healthy diet such as the DASH diet and exercise as tolerated.       Relevant Orders   TSH   CBC   Magnesium   Chronic diastolic congestive  heart failure (HCC)   Fatigue    Recent sleep study showed frequent wakening even on CPAP, pulmonology is adjusting CPAP due to 35 events an hour      Hyperlipidemia    Encouraged heart healthy diet, increase exercise, avoid trans fats, consider a krill oil cap daily      Relevant Orders   Lipid panel   Atrial fibrillation, converted to SR after 2nd dose of Tikosyn.    Doing well has an appt with cardiology      Gout   Relevant Orders   Uric acid   OSA (obstructive sleep apnea)    Using CPAP nightly and following with pulmonology      Decreased visual acuity    And diabetes, referred to opthamology for further consideration.      Relevant Orders   Ambulatory referral to Ophthalmology    Other Visit Diagnoses    Hyperkalemia    -  Primary   Relevant Orders   Comp Met (CMET) (Completed)   Comprehensive metabolic panel   Encounter for immunization       Relevant Medications   temazepam (RESTORIL) 30 MG capsule   HYDROcodone-acetaminophen (NORCO) 10-325 MG tablet   LORazepam (ATIVAN) 1 MG tablet   Other Relevant Orders   Flu Vaccine QUAD 36+ mos IM (Completed)   Comp Met (CMET) (Completed)   Ambulatory referral to Ophthalmology      I am having Mr. Mcnamee maintain his fish oil-omega-3 fatty acids, calcium carbonate, aspirin EC, Multiple Vitamins-Minerals (MULTIVITAMIN PO), naproxen, Testosterone, mupirocin ointment, cetirizine, Clobetasol Prop Emollient Base, magnesium oxide, allopurinol, dofetilide, albuterol, metoprolol, pravastatin, metFORMIN, warfarin, benazepril, colchicine, glyBURIDE, furosemide, potassium chloride SA, temazepam, HYDROcodone-acetaminophen, and LORazepam.  Meds ordered this encounter  Medications  . temazepam (RESTORIL) 30 MG capsule    Sig: TAKE ONE CAPSULE BY MOUTH AT BEDTIME AS NEEDED FOR SLEEP OR ANXIETY    Dispense:  30 capsule    Refill:  3  . HYDROcodone-acetaminophen (NORCO) 10-325 MG tablet    Sig: Take 1 tablet by mouth every 8  (eight) hours as needed for moderate pain or severe pain.    Dispense:  90 tablet    Refill:  0  . LORazepam (ATIVAN) 1 MG tablet    Sig: Take 1 tablet (1 mg total) by mouth 2 (two) times daily as needed. for anxiety    Dispense:  60 tablet    Refill:  3     Penni Homans, MD

## 2016-02-29 NOTE — Assessment & Plan Note (Signed)
And diabetes, referred to opthamology for further consideration.

## 2016-02-29 NOTE — Assessment & Plan Note (Signed)
Encouraged DASH diet, decrease po intake and increase exercise as tolerated. Needs 7-8 hours of sleep nightly. Avoid trans fats, eat small, frequent meals every 4-5 hours with lean proteins, complex carbs and healthy fats. Minimize simple carbs 

## 2016-03-08 ENCOUNTER — Telehealth: Payer: Self-pay

## 2016-03-08 NOTE — Telephone Encounter (Signed)
DMV Disability Parking Placard received and completed.  Pt notified and made aware it's available for pick up.  Original placed up front.  Copy numbered and placed in scan bin.

## 2016-03-17 ENCOUNTER — Ambulatory Visit (INDEPENDENT_AMBULATORY_CARE_PROVIDER_SITE_OTHER): Payer: Medicare Other | Admitting: Pharmacist Clinician (PhC)/ Clinical Pharmacy Specialist

## 2016-03-17 DIAGNOSIS — Z7901 Long term (current) use of anticoagulants: Secondary | ICD-10-CM

## 2016-03-17 DIAGNOSIS — I4891 Unspecified atrial fibrillation: Secondary | ICD-10-CM | POA: Diagnosis not present

## 2016-03-17 LAB — POCT INR: INR: 2.3

## 2016-03-18 ENCOUNTER — Other Ambulatory Visit: Payer: Self-pay

## 2016-03-18 ENCOUNTER — Other Ambulatory Visit: Payer: Self-pay | Admitting: Cardiovascular Disease

## 2016-03-18 MED ORDER — DOFETILIDE 500 MCG PO CAPS: 500.0000 ug | ORAL_CAPSULE | Freq: Two times a day (BID) | ORAL | 3 refills | Status: DC

## 2016-04-05 ENCOUNTER — Telehealth: Payer: Self-pay | Admitting: Pulmonary Disease

## 2016-04-05 NOTE — Telephone Encounter (Signed)
Spoke with pt. HE took his SD card to [facility] around 10/19 to have A download done but hasnt heard anything.  I told pt we would call [facility] tomorrow since they are closed for the day and let him know about download results.  Pt verbalized understanding.

## 2016-04-06 NOTE — Telephone Encounter (Signed)
Called and spoke with [name], refaxing download to our office.  Will await download

## 2016-04-07 NOTE — Telephone Encounter (Signed)
Download located and placed in RA's lookat  RA will return to the office 11.2.17 Will forward message to RA

## 2016-04-09 NOTE — Telephone Encounter (Signed)
Much better No residual events on CPAP 9 cm Good usage Continue same settings

## 2016-04-13 NOTE — Telephone Encounter (Signed)
Pt aware of below message. Pt would like to know exactly how many hours he is sleeping as he is still having daytime sleepiness. Pt download has not been scanned it. Pt also states he is having sob.   RA please advise. Thanks.

## 2016-04-13 NOTE — Telephone Encounter (Signed)
[phone] pt calling back

## 2016-04-13 NOTE — Telephone Encounter (Signed)
Seemed 7-8 hours based on CPAP report

## 2016-04-13 NOTE — Telephone Encounter (Signed)
lmtcb X1 to relay recs to pt

## 2016-04-13 NOTE — Telephone Encounter (Signed)
Pt is aware of hours of sleeping on CPAP per RA. Nothing more needed at this time.

## 2016-04-19 ENCOUNTER — Other Ambulatory Visit: Payer: Self-pay | Admitting: Family Medicine

## 2016-04-19 DIAGNOSIS — E669 Obesity, unspecified: Secondary | ICD-10-CM

## 2016-04-19 DIAGNOSIS — M25561 Pain in right knee: Secondary | ICD-10-CM

## 2016-04-19 DIAGNOSIS — E785 Hyperlipidemia, unspecified: Secondary | ICD-10-CM

## 2016-04-19 DIAGNOSIS — H409 Unspecified glaucoma: Secondary | ICD-10-CM

## 2016-04-19 DIAGNOSIS — Z7901 Long term (current) use of anticoagulants: Secondary | ICD-10-CM

## 2016-04-19 DIAGNOSIS — G4700 Insomnia, unspecified: Secondary | ICD-10-CM

## 2016-04-19 DIAGNOSIS — E1169 Type 2 diabetes mellitus with other specified complication: Secondary | ICD-10-CM

## 2016-04-19 DIAGNOSIS — M25562 Pain in left knee: Secondary | ICD-10-CM

## 2016-04-19 DIAGNOSIS — E875 Hyperkalemia: Secondary | ICD-10-CM

## 2016-04-19 DIAGNOSIS — I10 Essential (primary) hypertension: Secondary | ICD-10-CM

## 2016-04-19 DIAGNOSIS — F411 Generalized anxiety disorder: Secondary | ICD-10-CM

## 2016-04-19 DIAGNOSIS — D649 Anemia, unspecified: Secondary | ICD-10-CM

## 2016-04-26 ENCOUNTER — Encounter: Payer: Self-pay | Admitting: Adult Health

## 2016-05-02 ENCOUNTER — Other Ambulatory Visit: Payer: Self-pay | Admitting: Family Medicine

## 2016-05-06 ENCOUNTER — Ambulatory Visit (INDEPENDENT_AMBULATORY_CARE_PROVIDER_SITE_OTHER): Payer: Medicare Other | Admitting: Cardiovascular Disease

## 2016-05-06 ENCOUNTER — Encounter: Payer: Self-pay | Admitting: Cardiovascular Disease

## 2016-05-06 ENCOUNTER — Ambulatory Visit (INDEPENDENT_AMBULATORY_CARE_PROVIDER_SITE_OTHER): Payer: Medicare Other | Admitting: Pharmacist Clinician (PhC)/ Clinical Pharmacy Specialist

## 2016-05-06 VITALS — BP 110/70 | HR 69 | Ht 71.0 in | Wt 224.0 lb

## 2016-05-06 DIAGNOSIS — Z7901 Long term (current) use of anticoagulants: Secondary | ICD-10-CM

## 2016-05-06 DIAGNOSIS — I480 Paroxysmal atrial fibrillation: Secondary | ICD-10-CM | POA: Diagnosis not present

## 2016-05-06 DIAGNOSIS — I4891 Unspecified atrial fibrillation: Secondary | ICD-10-CM | POA: Diagnosis not present

## 2016-05-06 DIAGNOSIS — G4733 Obstructive sleep apnea (adult) (pediatric): Secondary | ICD-10-CM

## 2016-05-06 DIAGNOSIS — I5032 Chronic diastolic (congestive) heart failure: Secondary | ICD-10-CM

## 2016-05-06 DIAGNOSIS — Z6831 Body mass index (BMI) 31.0-31.9, adult: Secondary | ICD-10-CM

## 2016-05-06 DIAGNOSIS — I10 Essential (primary) hypertension: Secondary | ICD-10-CM

## 2016-05-06 DIAGNOSIS — E6609 Other obesity due to excess calories: Secondary | ICD-10-CM

## 2016-05-06 DIAGNOSIS — I351 Nonrheumatic aortic (valve) insufficiency: Secondary | ICD-10-CM | POA: Diagnosis not present

## 2016-05-06 LAB — POCT INR: INR: 2.3

## 2016-05-06 MED ORDER — FUROSEMIDE 40 MG PO TABS: ORAL_TABLET | ORAL | 3 refills | Status: DC

## 2016-05-06 MED ORDER — POTASSIUM CHLORIDE CRYS ER 20 MEQ PO TBCR: EXTENDED_RELEASE_TABLET | ORAL | 3 refills | Status: DC

## 2016-05-06 NOTE — Patient Instructions (Signed)
Medication Instructions: Dr Sallyanne Kuster has recommended making the following medication changes: 1. INCREASE Furosemide - take 80 mg in the morning and 40 mg in the early afternoon 2. INCREASE Potassium - take 40 mEq in the morning and 20 mEq in the early afternoon  Labwork: Your physician recommends that you return for lab work in 2-3 weeks.  Testing/Procedures: NONE ORDERED  Follow-up: Dr Sallyanne Kuster recommends that you schedule a follow-up appointment in 3 months.  If you need a refill on your cardiac medications before your next appointment, please call your pharmacy.

## 2016-05-06 NOTE — Progress Notes (Signed)
Cardiology Office Note    Date:  05/07/2016   ID:  BJ TERAN, DOB [birth year], MRN [id number]  PCP:  Penni Homans, MD  Cardiologist:   Sanda Klein, MD   Chief Complaint  Patient presents with  . Follow-up    pt c/o sob    History of Present Illness:  [name] is a 65 y.o. male with aortic insufficiency, diastolic heart failure, paroxysmal atrial fibrillation and atrial flutter, obstructive sleep apnea, dyslipidemia in the setting of obesity and type 2 diabetes mellitus.  Markevion has felt better after we increased his dose of diuretic but he never did reach the target weight of 210 pounds. His lowest weight was around 214 pounds and he was breathing better at that time On his home scale he weighs 218 pounds today (6 pounds less than our office scale)  Echocardiogram,  July 26:  normal left ventricular systolic function EF [id number], moderate LVH and moderate to severely dilated left atrium, suspicious for diastolic dysfunction and elevated filling pressures (although no comment was made on diastolic function on the report.). He has moderate aortic insufficiency (unchanged). The estimated systolic PA pressure was around 60 mmHg.  After the echo was performed I called him and asked him to increase his diuretics. Unfortunately this has not led to any meaningful net diuresis, as far as I can tell. His weight today is actually higher than it was at his last appointment. Therefore, I'm not sure how to compare his current fluid status but the day of his echo.  Still remains free of leg edema and denies palpitations, syncope or angina pectoris. He has not had bleeding complications and denies new focal neurological complaints. His rhythm is regular today. He is on chronic treatment with Tikosyn , which has proven to be excellent for prevention of atrial arrhythmia in him. QTC today 469 ms  His echo 2014 showed that left ventricular systolic function is slightly depressed with an EF of  45-50%. He had moderate aortic insufficiency which appears to be secondary to mild dilatation of the aortic root. He has a long-standing history of severe hypertension which has not always been well treated. He also has long-standing history of obstructive sleep apnea which was also not treated until recently, but he is now 100% CPAP-compliant. Coronary angiography 2011 did not show evidence of significant stenoses. He presented with typical atrial flutter in 2011 and has recurrent paroxysmal atrial fibrillation with a good response to treatment with dofetilide. Additional problems include obesity, type 2 diabetes mellitus, gout and androgen deficiency. He had a successful cardioversion on March 25 2015 for persistent atrial fibrillation.   Past Medical History:  Diagnosis Date  . AORTIC STENOSIS, MODERATE 12/05/2009  . Atrial fibrillation ([place])   . Atrial flutter ([place]) 12/05/2009  . Atypical chest pain 11/08/2011  . BENIGN PROSTATIC HYPERTROPHY, HX OF 12/05/2009  . CHEST PAIN-UNSPECIFIED 11/11/2009  . CHF 12/05/2009  . Debility 04/18/2012  . DM 12/05/2009  . DYSPNEA 12/19/2009  . Edema 04/18/2012  . ERECTILE DYSFUNCTION 01/25/2007  . FATIGUE, CHRONIC 11/11/2009  . Gout 07/10/2012  . Heart murmur   . Hyperkalemia 04/18/2012  . Hyperlipidemia   . Hypertension   . HYPERTENSION 01/25/2007  . Hypoxia 05/05/2011  . INSOMNIA, HX OF 01/25/2007  . Knee pain, bilateral 12/28/2010  . Knee pain, right 12/28/2010  . NEUROMA 01/05/2010  . OBESITY NOS 01/25/2007  . OSA (obstructive sleep apnea) 07/13/2014  . PERIPHERAL NEUROPATHY 01/05/2010  . PULMONARY FUNCTION TESTS, ABNORMAL  02/02/2010  . SLEEP APNEA 04/06/2010   uses cpap  . Superficial thrombophlebitis of left leg 09/07/2011  . TESTICULAR HYPOFUNCTION 01/05/2010  . 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 Toe pain, right 07/10/2012  . UNSPECIFIED ANEMIA 01/05/2010    Past Surgical History:  Procedure Laterality Date  . bilat foot surgery removed part of 5th metatarsal to corect  curvature of toes    . CARDIAC CATHETERIZATION  10/16/2009   nonischemic cardiomyopathy  . CARDIOVERSION N/A 03/25/2015   Procedure: CARDIOVERSION;  Surgeon: Pixie Casino, MD;  Location: [facility] ENDOSCOPY;  Service: Cardiovascular;  Laterality: N/A;    Current Medications: Outpatient Medications Prior to Visit  Medication Sig Dispense Refill  . albuterol (VENTOLIN HFA) 108 (90 Base) MCG/ACT inhaler Inhale 2 puffs into the lungs every 6 (six) hours as needed for wheezing or shortness of breath. 1 Inhaler 6  . allopurinol (ZYLOPRIM) 300 MG tablet TAKE ONE TABLET BY MOUTH ONCE DAILY 90 tablet 2  . aspirin EC 81 MG tablet Take 81 mg by mouth daily.    . benazepril (LOTENSIN) 40 MG tablet TAKE ONE TABLET BY MOUTH ONCE DAILY 90 tablet 3  . calcium carbonate (TUMS - DOSED IN MG ELEMENTAL CALCIUM) 500 MG chewable tablet Chew 1 tablet by mouth daily as needed. For heartburn    . cetirizine (ZYRTEC) 10 MG tablet Take 1 tablet (10 mg total) by mouth daily. 30 tablet 11  . Clobetasol Prop Emollient Base (CLOBETASOL PROPIONATE E) 0.05 % emollient cream Apply 1 application topically 2 (two) times daily. (Patient taking differently: Apply 1 application topically 2 (two) times daily as needed (insect bite). ) 60 g 0  . colchicine (COLCRYS) 0.6 MG tablet TAKE ONE TABLET BY MOUTH DAILY AS NEEDED FOR GOUT FLARE UPS. 30 tablet 0  . dofetilide (TIKOSYN) 500 MCG capsule Take 1 capsule (500 mcg total) by mouth 2 (two) times daily. 180 capsule 3  . fish oil-omega-3 fatty acids 1000 MG capsule Take 1 g by mouth daily.     [name] glyBURIDE (DIABETA) 5 MG tablet TAKE ONE TABLET BY MOUTH ONCE DAILY WITH  BREAKFAST 90 tablet 0  . HYDROcodone-acetaminophen (NORCO) 10-325 MG tablet Take 1 tablet by mouth every 8 (eight) hours as needed for moderate pain or severe pain. 90 tablet 0  . LORazepam (ATIVAN) 1 MG tablet Take 1 tablet (1 mg total) by mouth 2 (two) times daily as needed. for anxiety 60 tablet 3  . magnesium oxide (MAG-OX)  400 MG tablet Take 1 tablet (400 mg total) by mouth daily. 30 tablet 11  . metFORMIN (GLUCOPHAGE) 1000 MG tablet Take 1 tablet (1,000 mg total) by mouth 2 (two) times daily with a meal. 180 tablet 1  . metoprolol (LOPRESSOR) 100 MG tablet TAKE ONE TABLET BY MOUTH TWICE DAILY 180 tablet 3  . Multiple Vitamins-Minerals (MULTIVITAMIN PO) Take 1 tablet by mouth daily.    . mupirocin ointment (BACTROBAN) 2 % Place 1 application into the nose 2 (two) times daily. 30 g 1  . naproxen (NAPROSYN) 375 MG tablet TAKE ONE TABLET BY MOUTH ONCE DAILY AS NEEDED FOR PAIN 30 tablet 4  . pravastatin (PRAVACHOL) 40 MG tablet TAKE ONE TABLET BY MOUTH ONCE DAILY 90 tablet 3  . temazepam (RESTORIL) 30 MG capsule TAKE ONE CAPSULE BY MOUTH AT BEDTIME AS NEEDED FOR SLEEP OR ANXIETY 30 capsule 3  . Testosterone 10 MG/ACT (2%) GEL Apply 1 application topically daily. Use as directed    . warfarin (COUMADIN) 4 MG tablet  TAKE ONE TABLET BY MOUTH ONCE DAILY OR AS DIRECTED 90 tablet 1  . furosemide (LASIX) 40 MG tablet TAKE ONE TABLET 40 MG BY MOUTH DAILY AND EXTRA TABLET, IF WEIGHT IS ABOVE 210  LBS. 180 tablet 3  . potassium chloride SA (K-DUR,KLOR-CON) 20 MEQ tablet TAKE ONE TABLET (20 MEQ) BY MOUTH DAILY AND EXTRA TABLET,  IF WEIGHT IS ABOVE 210  LBS. 180 tablet 3   No facility-administered medications prior to visit.      Allergies:   Sulfonamide derivatives and Zolpidem   Social History   Social History  . Marital status: Married    Spouse name: N/A  . Number of children: N/A  . Years of education: N/A   Social History Main Topics  . Smoking status: Former Smoker    Packs/day: 1.00    Years: 10.00    Quit date: 06/07/1980  . Smokeless tobacco: Never Used  . Alcohol use No  . Drug use: No  . Sexual activity: Not Currently   Other Topics Concern  . None   Social History Narrative  . None     Family History:  The patient's family history includes Aneurysm in his sister; Cancer in his father; Clotting  disorder in his mother; Diabetes in his mother and sister; Heart attack in his mother; Heart disease in his mother; Hyperlipidemia in his mother; Hypertension in his mother and sister; Leukemia in his maternal grandmother; Lung disease in his father; Other in his sister; Seizures in his sister; Stroke in his mother.   ROS:   Please see the history of present illness.    ROS All other systems reviewed and are negative.   PHYSICAL EXAM:   VS:  BP 110/70   Pulse 69   Ht 5\' 11"  (1.803 m)   Wt 224 lb (101.6 kg)   SpO2 97%   BMI 31.24 kg/m    GEN: Well nourished, well developed, in no acute distress  HEENT: normal  Neck: no JVD, carotid bruits, or masses Cardiac: RRR, loud S2; 3/6 decrescendo aortic insufficiency murmur heard best at the left upper sternal border, rubs, or gallops,no edema  Respiratory:  clear to auscultation bilaterally, normal work of breathing GI: soft, nontender, nondistended, + BS MS: no deformity or atrophy  Skin: warm and dry, no rash Neuro:  Alert and Oriented x 3, Strength and sensation are intact Psych: euthymic mood, full affect  Wt Readings from Last 3 Encounters:  05/06/16 224 lb (101.6 kg)  02/19/16 223 lb 2 oz (101.2 kg)  02/13/16 217 lb (98.4 kg)      Studies/Labs Reviewed:   EKG:  EKG is ordered today.  The ekg ordered today demonstrates Sinus rhythm with mild first-degree AV block , QTC 469 ms, nonspecific T-wave changes  Recent Labs: 02/18/2016: Hemoglobin 15.0; Magnesium 2.0; Platelets 201.0; TSH 1.35 02/19/2016: ALT 15; BUN 24; Creatinine, Ser 1.28; Potassium 5.0; Sodium 142   Lipid Panel    Component Value Date/Time   CHOL 138 02/18/2016 1019   TRIG 108.0 02/18/2016 1019   HDL 30.50 (L) 02/18/2016 1019   CHOLHDL 5 02/18/2016 1019   VLDL 21.6 02/18/2016 1019   LDLCALC 86 02/18/2016 1019     ASSESSMENT:    1. Chronic diastolic congestive heart failure (HCC)   2. Paroxysmal atrial fibrillation ([place])   3. Moderate aortic  insufficiency   4. OSA (obstructive sleep apnea)   5. Class 1 obesity due to excess calories with serious comorbidity and body mass index (BMI)  of 31.0 to 31.9 in adult   6. Essential hypertension      PLAN:  In order of problems listed above:  1. Chronic (predominantly diastolic) congestive heart failure  Continue to shoot for an estimated "dry weight" of 210 pounds. Increased furosemide to 80 mg AM + 40 mg PM daily and will increase potassium to 40 mEq AM + 20 mEq PM daily also. Need to monitor potassium levels and renal function carefully. Recheck labs in a month.  2. Atrial fibrillation Very good prevention since we started treatment with Tikosyn. He tolerates AF poorly, probably due to significant diastolic dysfunction. He had convincing evidence of tachycardia-related cardiomyopathy in 2011. He is at relatively high risk of embolic events and should continue warfarin anticoagulation without interruption. CHADSVAsc 3 (HTN, CHF, DM). Reviewed the risk of drug interactions with dofetilide and with warfarin.  3. Moderate aortic insufficiency  Result unchanged by current echo. Hypertension is likely the cause of his dilated aortic root, in turn the cause for his aortic valve insufficiency. We have discussed possible need for right left heart catheterization and aortic valve surgery if the leak becomes severe.   4. SLEEP APNEA  Compliant with CPAP.   5. Obesity Repeated the recommendation for weight loss, discussed calorie restriction and physical exercise. He cannot exercise a lot because of severe knee arthritis. In fact right knee replacement has been recommended.   6. Malignant hypertension Blood pressure with excellent control on current regimen   Medication Adjustments/Labs and Tests Ordered: Current medicines are reviewed at length with the patient today.  Concerns regarding medicines are outlined above.  Medication changes, Labs and Tests ordered today are listed in the  Patient Instructions below. Patient Instructions  Medication Instructions: Dr Sallyanne Kuster has recommended making the following medication changes: 1. INCREASE Furosemide - take 80 mg in the morning and 40 mg in the early afternoon 2. INCREASE Potassium - take 40 mEq in the morning and 20 mEq in the early afternoon  Labwork: Your physician recommends that you return for lab work in 2-3 weeks.  Testing/Procedures: NONE ORDERED  Follow-up: Dr Sallyanne Kuster recommends that you schedule a follow-up appointment in 3 months.  If you need a refill on your cardiac medications before your next appointment, please call your pharmacy.    Signed, Sanda Klein, MD  05/07/2016 12:43 PM    [address], [place], [place]  [address] Phone: [phone]; Fax: [phone]

## 2016-05-13 ENCOUNTER — Telehealth: Payer: Self-pay | Admitting: Family Medicine

## 2016-05-13 ENCOUNTER — Ambulatory Visit (INDEPENDENT_AMBULATORY_CARE_PROVIDER_SITE_OTHER): Payer: Medicare Other | Admitting: Family Medicine

## 2016-05-13 ENCOUNTER — Encounter: Payer: Self-pay | Admitting: Family Medicine

## 2016-05-13 VITALS — BP 112/74 | HR 70 | Temp 97.7°F | Ht 71.0 in | Wt 225.2 lb

## 2016-05-13 DIAGNOSIS — G4700 Insomnia, unspecified: Secondary | ICD-10-CM

## 2016-05-13 DIAGNOSIS — D649 Anemia, unspecified: Secondary | ICD-10-CM

## 2016-05-13 DIAGNOSIS — I10 Essential (primary) hypertension: Secondary | ICD-10-CM

## 2016-05-13 DIAGNOSIS — M25562 Pain in left knee: Secondary | ICD-10-CM | POA: Diagnosis not present

## 2016-05-13 DIAGNOSIS — E1169 Type 2 diabetes mellitus with other specified complication: Secondary | ICD-10-CM

## 2016-05-13 DIAGNOSIS — I480 Paroxysmal atrial fibrillation: Secondary | ICD-10-CM | POA: Diagnosis not present

## 2016-05-13 DIAGNOSIS — M25561 Pain in right knee: Secondary | ICD-10-CM

## 2016-05-13 DIAGNOSIS — E669 Obesity, unspecified: Secondary | ICD-10-CM

## 2016-05-13 DIAGNOSIS — E782 Mixed hyperlipidemia: Secondary | ICD-10-CM

## 2016-05-13 DIAGNOSIS — M1A9XX0 Chronic gout, unspecified, without tophus (tophi): Secondary | ICD-10-CM

## 2016-05-13 HISTORY — DX: Insomnia, unspecified: G47.00

## 2016-05-13 MED ORDER — HYDROCODONE-ACETAMINOPHEN 10-325 MG PO TABS: 1.0000 | ORAL_TABLET | Freq: Three times a day (TID) | ORAL | 0 refills | Status: DC | PRN

## 2016-05-13 MED ORDER — NAPROXEN 375 MG PO TABS: 375.0000 mg | ORAL_TABLET | Freq: Every day | ORAL | 4 refills | Status: DC

## 2016-05-13 MED ORDER — METFORMIN HCL 1000 MG PO TABS: 1000.0000 mg | ORAL_TABLET | Freq: Two times a day (BID) | ORAL | 1 refills | Status: DC

## 2016-05-13 NOTE — Telephone Encounter (Signed)
Please notify patient that the current guidelines recommend I discontinue the Naproxen. So he should avoid taking it and try 2 ES Tylenol twice daily for a month and see how that does. Tylenol works best when taken in standing doses as opposed to as needed

## 2016-05-13 NOTE — Assessment & Plan Note (Signed)
Right knee is most symptomatic and is able to use meds sparingly with good results, declines referral at this time.

## 2016-05-13 NOTE — Patient Instructions (Signed)

## 2016-05-13 NOTE — Assessment & Plan Note (Signed)
Rate  Controlled, following closely with cardiology

## 2016-05-13 NOTE — Assessment & Plan Note (Signed)
hgba1c acceptable, minimize simple carbs. Increase exercise as tolerated. Continue current meds 

## 2016-05-13 NOTE — Assessment & Plan Note (Signed)
No flares recently but worried about increase of diuretics

## 2016-05-13 NOTE — Telephone Encounter (Signed)
Updated medication list. Let pharmacy know to D/C naproxen per PCP instructions. Patient agreed to do.

## 2016-05-13 NOTE — Assessment & Plan Note (Signed)
Increase leafy greens, consider increased lean red meat and using cast iron cookware. Continue to monitor, report any concerns 

## 2016-05-13 NOTE — Assessment & Plan Note (Signed)
Encouraged heart healthy diet, increase exercise, avoid trans fats, consider a krill oil cap daily 

## 2016-05-13 NOTE — Progress Notes (Signed)
Patient ID: [name], male   DOB: August 05, 1950, 65 y.o.   MRN: [id number]   Subjective:    Patient ID: [name], male    DOB: 26-Aug-1950, 65 y.o.   MRN: [id number]  Chief Complaint  Patient presents with  . Follow-up    HPI Patient is in today for follow up. He continues to struggle with life long sleeping difficulties but he reports his currrent meds are helpful. He denies any concerning side effects from meds. He is struggling with some knee pain but denies any recent trauma, swelling, redness or warmth. Continues to follow closely with cardiology and they have recently adjusted his meds with good results. Denies CP/palp/HA/congestion/fevers/GI or GU c/o. Taking meds as prescribed  Past Medical History:  Diagnosis Date  . AORTIC STENOSIS, MODERATE 12/05/2009  . Atrial fibrillation ([place])   . Atrial flutter ([place]) 12/05/2009  . Atypical chest pain 11/08/2011  . BENIGN PROSTATIC HYPERTROPHY, HX OF 12/05/2009  . CHEST PAIN-UNSPECIFIED 11/11/2009  . CHF 12/05/2009  . Debility 04/18/2012  . DM 12/05/2009  . DYSPNEA 12/19/2009  . Edema 04/18/2012  . ERECTILE DYSFUNCTION 01/25/2007  . FATIGUE, CHRONIC 11/11/2009  . Gout 07/10/2012  . Heart murmur   . Hyperkalemia 04/18/2012  . Hyperlipidemia   . Hypertension   . HYPERTENSION 01/25/2007  . Hypoxia 05/05/2011  . Insomnia 05/13/2016  . INSOMNIA, HX OF 01/25/2007  . Knee pain, bilateral 12/28/2010  . Knee pain, right 12/28/2010  . NEUROMA 01/05/2010  . OBESITY NOS 01/25/2007  . OSA (obstructive sleep apnea) 07/13/2014  . PERIPHERAL NEUROPATHY 01/05/2010  . PULMONARY FUNCTION TESTS, ABNORMAL 02/02/2010  . SLEEP APNEA 04/06/2010   uses cpap  . Superficial thrombophlebitis of left leg 09/07/2011  . TESTICULAR HYPOFUNCTION 01/05/2010  . 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 Toe pain, right 07/10/2012  . UNSPECIFIED ANEMIA 01/05/2010    Past Surgical History:  Procedure Laterality Date  . bilat foot surgery removed part of 5th metatarsal to corect curvature of toes      . CARDIAC CATHETERIZATION  10/16/2009   nonischemic cardiomyopathy  . CARDIOVERSION N/A 03/25/2015   Procedure: CARDIOVERSION;  Surgeon: Pixie Casino, MD;  Location: [facility] ENDOSCOPY;  Service: Cardiovascular;  Laterality: N/A;    Family History  Problem Relation Age of Onset  . Clotting disorder Mother   . Heart disease Mother     s/p MI  . Heart attack Mother   . Hypertension Mother   . Diabetes Mother   . Hyperlipidemia Mother   . Stroke Mother   . Cancer Father     ? lung  . Lung disease Father     smoker  . Diabetes Sister   . Hypertension Sister     smoker  . Leukemia Maternal Grandmother     ?  [name] Aneurysm Sister     brain  . Other Sister     clipped  . Seizures Sister     d/o w/aneurysm/ smoker    Social History   Social History  . Marital status: Married    Spouse name: N/A  . Number of children: N/A  . Years of education: N/A   Occupational History  . Not on file.   Social History Main Topics  . Smoking status: Former Smoker    Packs/day: 1.00    Years: 10.00    Quit date: 06/07/1980  . Smokeless tobacco: Never Used  . Alcohol use No  . Drug use: No  . Sexual activity: Not Currently  Other Topics Concern  . Not on file   Social History Narrative  . No narrative on file    Outpatient Medications Prior to Visit  Medication Sig Dispense Refill  . albuterol (VENTOLIN HFA) 108 (90 Base) MCG/ACT inhaler Inhale 2 puffs into the lungs every 6 (six) hours as needed for wheezing or shortness of breath. 1 Inhaler 6  . allopurinol (ZYLOPRIM) 300 MG tablet TAKE ONE TABLET BY MOUTH ONCE DAILY 90 tablet 2  . aspirin EC 81 MG tablet Take 81 mg by mouth daily.    . benazepril (LOTENSIN) 40 MG tablet TAKE ONE TABLET BY MOUTH ONCE DAILY 90 tablet 3  . calcium carbonate (TUMS - DOSED IN MG ELEMENTAL CALCIUM) 500 MG chewable tablet Chew 1 tablet by mouth daily as needed. For heartburn    . cetirizine (ZYRTEC) 10 MG tablet Take 1 tablet (10 mg total) by mouth  daily. 30 tablet 11  . Clobetasol Prop Emollient Base (CLOBETASOL PROPIONATE E) 0.05 % emollient cream Apply 1 application topically 2 (two) times daily. (Patient taking differently: Apply 1 application topically 2 (two) times daily as needed (insect bite). ) 60 g 0  . colchicine (COLCRYS) 0.6 MG tablet TAKE ONE TABLET BY MOUTH DAILY AS NEEDED FOR GOUT FLARE UPS. 30 tablet 0  . dofetilide (TIKOSYN) 500 MCG capsule Take 1 capsule (500 mcg total) by mouth 2 (two) times daily. 180 capsule 3  . fish oil-omega-3 fatty acids 1000 MG capsule Take 1 g by mouth daily.     . furosemide (LASIX) 40 MG tablet Take 2 tablets (80 mg total) by mouth every morning and take 1 tablet (40 mg total) by mouth every afternoon. 270 tablet 3  . glyBURIDE (DIABETA) 5 MG tablet TAKE ONE TABLET BY MOUTH ONCE DAILY WITH  BREAKFAST 90 tablet 0  . LORazepam (ATIVAN) 1 MG tablet Take 1 tablet (1 mg total) by mouth 2 (two) times daily as needed. for anxiety 60 tablet 3  . magnesium oxide (MAG-OX) 400 MG tablet Take 1 tablet (400 mg total) by mouth daily. 30 tablet 11  . metoprolol (LOPRESSOR) 100 MG tablet TAKE ONE TABLET BY MOUTH TWICE DAILY 180 tablet 3  . Multiple Vitamins-Minerals (MULTIVITAMIN PO) Take 1 tablet by mouth daily.    . mupirocin ointment (BACTROBAN) 2 % Place 1 application into the nose 2 (two) times daily. 30 g 1  . potassium chloride SA (K-DUR,KLOR-CON) 20 MEQ tablet Take 2 tablets (40 mEq total) by mouth every morning and take 1 tablet (20 mEq total) by mouth every evening. 270 tablet 3  . pravastatin (PRAVACHOL) 40 MG tablet TAKE ONE TABLET BY MOUTH ONCE DAILY 90 tablet 3  . temazepam (RESTORIL) 30 MG capsule TAKE ONE CAPSULE BY MOUTH AT BEDTIME AS NEEDED FOR SLEEP OR ANXIETY 30 capsule 3  . Testosterone 10 MG/ACT (2%) GEL Apply 1 application topically daily. Use as directed    . warfarin (COUMADIN) 4 MG tablet TAKE ONE TABLET BY MOUTH ONCE DAILY OR AS DIRECTED 90 tablet 1  . HYDROcodone-acetaminophen (NORCO)  10-325 MG tablet Take 1 tablet by mouth every 8 (eight) hours as needed for moderate pain or severe pain. 90 tablet 0  . metFORMIN (GLUCOPHAGE) 1000 MG tablet Take 1 tablet (1,000 mg total) by mouth 2 (two) times daily with a meal. 180 tablet 1  . naproxen (NAPROSYN) 375 MG tablet TAKE ONE TABLET BY MOUTH ONCE DAILY AS NEEDED FOR PAIN 30 tablet 4   No facility-administered medications prior to  visit.     Allergies  Allergen Reactions  . Sulfonamide Derivatives Other (See Comments)    unknown reaction.  Patient states had to go to the ER.   [name] Zolpidem Other (See Comments)    Excessive, prolonged sedation    Review of Systems  Constitutional: Positive for malaise/fatigue. Negative for fever.  HENT: Negative for congestion.   Eyes: Negative for blurred vision.  Respiratory: Positive for shortness of breath.   Cardiovascular: Negative for chest pain, palpitations and leg swelling.  Gastrointestinal: Negative for abdominal pain, blood in stool and nausea.  Genitourinary: Negative for dysuria and frequency.  Musculoskeletal: Positive for joint pain. Negative for falls.  Skin: Negative for rash.  Neurological: Negative for dizziness, loss of consciousness and headaches.  Endo/Heme/Allergies: Negative for environmental allergies.  Psychiatric/Behavioral: Negative for depression. The patient is not nervous/anxious.        Objective:    Physical Exam  Constitutional: He is oriented to person, place, and time. He appears well-developed and well-nourished. No distress.  HENT:  Head: Normocephalic and atraumatic.  Nose: Nose normal.  Eyes: Right eye exhibits no discharge. Left eye exhibits no discharge.  Neck: Normal range of motion. Neck supple.  Cardiovascular: Normal rate and regular rhythm.   Pulmonary/Chest: Effort normal and breath sounds normal.  Abdominal: Soft. Bowel sounds are normal. There is no tenderness.  Musculoskeletal: He exhibits no edema.  Neurological: He is alert  and oriented to person, place, and time.  Skin: Skin is warm and dry.  Psychiatric: He has a normal mood and affect.  Nursing note and vitals reviewed.   BP 112/74 (BP Location: Left Arm, Patient Position: Sitting, Cuff Size: Large)   Pulse 70   Temp 97.7 F (36.5 C) (Oral)   Ht 5\' 11"  (1.803 m)   Wt 225 lb 4 oz (102.2 kg)   SpO2 99%   BMI 31.42 kg/m  Wt Readings from Last 3 Encounters:  05/13/16 225 lb 4 oz (102.2 kg)  05/06/16 224 lb (101.6 kg)  02/19/16 223 lb 2 oz (101.2 kg)     Lab Results  Component Value Date   WBC 4.1 02/18/2016   HGB 15.0 02/18/2016   HCT 45.2 02/18/2016   PLT 201.0 02/18/2016   GLUCOSE 122 (H) 02/19/2016   CHOL 138 02/18/2016   TRIG 108.0 02/18/2016   HDL 30.50 (L) 02/18/2016   LDLCALC 86 02/18/2016   ALT 15 02/19/2016   AST 18 02/19/2016   NA 142 02/19/2016   K 5.0 02/19/2016   CL 110 02/19/2016   CREATININE 1.28 02/19/2016   BUN 24 (H) 02/19/2016   CO2 28 02/19/2016   TSH 1.35 02/18/2016   PSA 4.26 (H) 12/29/2009   INR 2.3 05/06/2016   HGBA1C 6.7 (H) 02/18/2016   MICROALBUR 1.1 04/14/2015    Lab Results  Component Value Date   TSH 1.35 02/18/2016   Lab Results  Component Value Date   WBC 4.1 02/18/2016   HGB 15.0 02/18/2016   HCT 45.2 02/18/2016   MCV 79.0 02/18/2016   PLT 201.0 02/18/2016   Lab Results  Component Value Date   NA 142 02/19/2016   K 5.0 02/19/2016   CO2 28 02/19/2016   GLUCOSE 122 (H) 02/19/2016   BUN 24 (H) 02/19/2016   CREATININE 1.28 02/19/2016   BILITOT 0.4 02/19/2016   ALKPHOS 45 02/19/2016   AST 18 02/19/2016   ALT 15 02/19/2016   PROT 7.3 02/19/2016   ALBUMIN 4.2 02/19/2016   CALCIUM 9.6  02/19/2016   GFR 72.97 02/19/2016   Lab Results  Component Value Date   CHOL 138 02/18/2016   Lab Results  Component Value Date   HDL 30.50 (L) 02/18/2016   Lab Results  Component Value Date   LDLCALC 86 02/18/2016   Lab Results  Component Value Date   TRIG 108.0 02/18/2016   Lab Results    Component Value Date   CHOLHDL 5 02/18/2016   Lab Results  Component Value Date   HGBA1C 6.7 (H) 02/18/2016       Assessment & Plan:   Problem List Items Addressed This Visit    Diabetes mellitus type 2 in obese ([place])    hgba1c acceptable, minimize simple carbs. Increase exercise as tolerated. Continue current meds      Relevant Medications   metFORMIN (GLUCOPHAGE) 1000 MG tablet   Other Relevant Orders   Hemoglobin A1c   Magnesium   Anemia - Primary    Increase leafy greens, consider increased lean red meat and using cast iron cookware. Continue to monitor, report any concerns      Relevant Orders   CBC   Essential hypertension    Well controlled, no changes to meds. Encouraged heart healthy diet such as the DASH diet and exercise as tolerated.       Relevant Orders   CBC   Comprehensive metabolic panel   Magnesium   TSH   Hyperlipidemia    Encouraged heart healthy diet, increase exercise, avoid trans fats, consider a krill oil cap daily      Relevant Orders   Lipid panel   Knee pain, bilateral    Right knee is most symptomatic and is able to use meds sparingly with good results, declines referral at this time.      Relevant Medications   HYDROcodone-acetaminophen (NORCO) 10-325 MG tablet   Atrial fibrillation, converted to SR after 2nd dose of Tikosyn.    Rate  Controlled, following closely with cardiology      Relevant Orders   CBC   Magnesium   Gout    No flares recently but worried about increase of diuretics      Relevant Orders   Uric acid   Insomnia    Stable on current regimen. He has a lifetime trouble with insomnia and his meds help his sleep         I have changed Mr. Foyt naproxen. I am also having him maintain his fish oil-omega-3 fatty acids, calcium carbonate, aspirin EC, Multiple Vitamins-Minerals (MULTIVITAMIN PO), Testosterone, mupirocin ointment, cetirizine, Clobetasol Prop Emollient Base, magnesium oxide, albuterol,  metoprolol, pravastatin, warfarin, benazepril, colchicine, temazepam, LORazepam, dofetilide, allopurinol, glyBURIDE, furosemide, potassium chloride SA, metFORMIN, and HYDROcodone-acetaminophen.  Meds ordered this encounter  Medications  . metFORMIN (GLUCOPHAGE) 1000 MG tablet    Sig: Take 1 tablet (1,000 mg total) by mouth 2 (two) times daily with a meal.    Dispense:  180 tablet    Refill:  1  . naproxen (NAPROSYN) 375 MG tablet    Sig: Take 1 tablet (375 mg total) by mouth daily.    Dispense:  30 tablet    Refill:  4  . HYDROcodone-acetaminophen (NORCO) 10-325 MG tablet    Sig: Take 1 tablet by mouth every 8 (eight) hours as needed for moderate pain or severe pain.    Dispense:  90 tablet    Refill:  0     Penni Homans, MD

## 2016-05-13 NOTE — Progress Notes (Signed)
Pre visit review using our clinic review tool, if applicable. No additional management support is needed unless otherwise documented below in the visit note. 

## 2016-05-13 NOTE — Assessment & Plan Note (Signed)
Well controlled, no changes to meds. Encouraged heart healthy diet such as the DASH diet and exercise as tolerated.  °

## 2016-05-13 NOTE — Assessment & Plan Note (Signed)
Stable on current regimen. He has a lifetime trouble with insomnia and his meds help his sleep

## 2016-05-13 NOTE — Telephone Encounter (Signed)
Pharmacy needs clarification ----They state the use of naproxen with warfarin may increase risk of GI Bleeding.   Please advise. Roslyn

## 2016-05-20 ENCOUNTER — Ambulatory Visit: Payer: Medicare Other | Admitting: Family Medicine

## 2016-05-21 ENCOUNTER — Other Ambulatory Visit (INDEPENDENT_AMBULATORY_CARE_PROVIDER_SITE_OTHER): Payer: Medicare Other

## 2016-05-21 DIAGNOSIS — R5381 Other malaise: Secondary | ICD-10-CM

## 2016-05-21 DIAGNOSIS — E875 Hyperkalemia: Secondary | ICD-10-CM | POA: Diagnosis not present

## 2016-05-21 DIAGNOSIS — G4733 Obstructive sleep apnea (adult) (pediatric): Secondary | ICD-10-CM

## 2016-05-21 DIAGNOSIS — R0902 Hypoxemia: Secondary | ICD-10-CM

## 2016-05-21 DIAGNOSIS — E1169 Type 2 diabetes mellitus with other specified complication: Secondary | ICD-10-CM | POA: Diagnosis not present

## 2016-05-21 DIAGNOSIS — I10 Essential (primary) hypertension: Secondary | ICD-10-CM | POA: Diagnosis not present

## 2016-05-21 DIAGNOSIS — M109 Gout, unspecified: Secondary | ICD-10-CM | POA: Diagnosis not present

## 2016-05-21 DIAGNOSIS — E669 Obesity, unspecified: Secondary | ICD-10-CM | POA: Diagnosis not present

## 2016-05-21 DIAGNOSIS — E785 Hyperlipidemia, unspecified: Secondary | ICD-10-CM | POA: Diagnosis not present

## 2016-05-21 DIAGNOSIS — Z7901 Long term (current) use of anticoagulants: Secondary | ICD-10-CM

## 2016-05-21 LAB — HEMOGLOBIN A1C: Hgb A1c MFr Bld: 6.8 % — ABNORMAL HIGH (ref 4.6–6.5)

## 2016-05-21 LAB — COMPREHENSIVE METABOLIC PANEL
ALK PHOS: 49 U/L (ref 39–117)
ALT: 16 U/L (ref 0–53)
AST: 12 U/L (ref 0–37)
Albumin: 4.5 g/dL (ref 3.5–5.2)
BILIRUBIN TOTAL: 0.4 mg/dL (ref 0.2–1.2)
BUN: 35 mg/dL — ABNORMAL HIGH (ref 6–23)
CALCIUM: 10.4 mg/dL (ref 8.4–10.5)
CO2: 27 mEq/L (ref 19–32)
Chloride: 105 mEq/L (ref 96–112)
Creatinine, Ser: 1.6 mg/dL — ABNORMAL HIGH (ref 0.40–1.50)
GFR: 56.36 mL/min — AB (ref >60.00)
Glucose, Bld: 155 mg/dL — ABNORMAL HIGH (ref 70–99)
POTASSIUM: 5.2 meq/L — AB (ref 3.5–5.1)
Sodium: 142 mEq/L (ref 135–145)
TOTAL PROTEIN: 7.5 g/dL (ref 6.0–8.3)

## 2016-05-21 LAB — CBC
HEMATOCRIT: 46.7 % (ref 39.0–52.0)
HEMOGLOBIN: 15.4 g/dL (ref 13.0–17.0)
MCHC: 32.9 g/dL (ref 30.0–36.0)
MCV: 81.9 fl (ref 78.0–100.0)
Platelets: 230 10*3/uL (ref 150.0–400.0)
RBC: 5.71 Mil/uL (ref 4.22–5.81)
RDW: 16 % — ABNORMAL HIGH (ref 11.5–15.5)
WBC: 5.4 10*3/uL (ref 4.0–10.5)

## 2016-05-21 LAB — LIPID PANEL
CHOL/HDL RATIO: 5
Cholesterol: 151 mg/dL (ref 0–200)
HDL: 32.7 mg/dL — ABNORMAL LOW (ref >39.00)
LDL CALC: 84 mg/dL (ref 0–99)
NONHDL: 118.28
Triglycerides: 172 mg/dL — ABNORMAL HIGH (ref 0.0–149.0)
VLDL: 34.4 mg/dL (ref 0.0–40.0)

## 2016-05-21 LAB — URIC ACID: URIC ACID, SERUM: 5.4 mg/dL (ref 4.0–7.8)

## 2016-05-21 LAB — TSH: TSH: 1.59 u[IU]/mL (ref 0.35–4.50)

## 2016-05-21 LAB — MAGNESIUM: Magnesium: 2 mg/dL (ref 1.5–2.5)

## 2016-05-24 ENCOUNTER — Other Ambulatory Visit: Payer: Self-pay | Admitting: Family Medicine

## 2016-05-24 DIAGNOSIS — N289 Disorder of kidney and ureter, unspecified: Secondary | ICD-10-CM

## 2016-05-26 ENCOUNTER — Telehealth: Payer: Self-pay | Admitting: Cardiovascular Disease

## 2016-05-26 NOTE — Telephone Encounter (Signed)
Patient called regarding lab results. He had labs drawn ordered by PCP. Would like Dr. Loletha Grayer to review these (in [facility])  Message routed to MD Per last visit patient was to have labs - he ended up having them done but ordered by PCP thus did not go to Dr Loletha Grayer

## 2016-05-26 NOTE — Telephone Encounter (Signed)
I looked at labs. The worsened kidney function is indeed a little worse. This is definitely due to the increase in diuretics and is not unexpected. We often have to sacrifice a little bit on the kidney function to assure best fluid situation for CHF. Was there any improvement in his breathing after we increased the lasix? Agree with rechecking labs next week.  If the extra lasix made a significant improvement in breathing, it may be a net benefit, just will need frequent reevaluation of labs.

## 2016-05-26 NOTE — Telephone Encounter (Signed)
°  Follow Up   Calling to follow up of blood work results. Please call.

## 2016-05-27 NOTE — Telephone Encounter (Signed)
Called patient.   He reports he may have had some benefit to lasix regarding breathing but has noticed a pain in his left side now (per patient, wonders if it is his left kidney?).   He was advised to continue with meds as advised, he is having labs done again 12/26.

## 2016-06-02 ENCOUNTER — Other Ambulatory Visit (INDEPENDENT_AMBULATORY_CARE_PROVIDER_SITE_OTHER): Payer: Medicare Other

## 2016-06-02 DIAGNOSIS — N289 Disorder of kidney and ureter, unspecified: Secondary | ICD-10-CM

## 2016-06-02 LAB — COMPREHENSIVE METABOLIC PANEL
ALBUMIN: 4.3 g/dL (ref 3.5–5.2)
ALK PHOS: 46 U/L (ref 39–117)
ALT: 16 U/L (ref 0–53)
AST: 12 U/L (ref 0–37)
BILIRUBIN TOTAL: 0.4 mg/dL (ref 0.2–1.2)
BUN: 36 mg/dL — ABNORMAL HIGH (ref 6–23)
CALCIUM: 9.9 mg/dL (ref 8.4–10.5)
CO2: 26 mEq/L (ref 19–32)
Chloride: 107 mEq/L (ref 96–112)
Creatinine, Ser: 1.48 mg/dL (ref 0.40–1.50)
GFR: 61.66 mL/min (ref >60.00)
GLUCOSE: 154 mg/dL — AB (ref 70–99)
POTASSIUM: 5 meq/L (ref 3.5–5.1)
Sodium: 142 mEq/L (ref 135–145)
TOTAL PROTEIN: 7.2 g/dL (ref 6.0–8.3)

## 2016-06-02 NOTE — Telephone Encounter (Signed)
I agree, furosemide should not cause kidney pain, even if it makes kidney function worse. Will wait for labs. Continue same meds. Talk to PCP if he has fever, chills, blood in urine to evaluate for urine infection or kidney stone  MCr

## 2016-06-02 NOTE — Telephone Encounter (Signed)
Rental function improved and close to baseline. A1c a little worse but still acceptable  MCr

## 2016-06-02 NOTE — Telephone Encounter (Signed)
Labs drawn at PCP office are now in [facility] for review

## 2016-06-02 NOTE — Telephone Encounter (Signed)
Patient called with lab results reviewed by Dr. Loletha Grayer (drawn at PCP office)

## 2016-06-16 ENCOUNTER — Ambulatory Visit (INDEPENDENT_AMBULATORY_CARE_PROVIDER_SITE_OTHER): Payer: Medicare Other | Admitting: Pharmacist

## 2016-06-16 DIAGNOSIS — Z7901 Long term (current) use of anticoagulants: Secondary | ICD-10-CM

## 2016-06-16 DIAGNOSIS — I4891 Unspecified atrial fibrillation: Secondary | ICD-10-CM | POA: Diagnosis not present

## 2016-06-16 LAB — POCT INR: INR: 2.1

## 2016-07-01 DIAGNOSIS — R972 Elevated prostate specific antigen [PSA]: Secondary | ICD-10-CM | POA: Diagnosis not present

## 2016-07-08 DIAGNOSIS — N5201 Erectile dysfunction due to arterial insufficiency: Secondary | ICD-10-CM | POA: Diagnosis not present

## 2016-07-08 DIAGNOSIS — E291 Testicular hypofunction: Secondary | ICD-10-CM | POA: Diagnosis not present

## 2016-07-08 DIAGNOSIS — R972 Elevated prostate specific antigen [PSA]: Secondary | ICD-10-CM | POA: Diagnosis not present

## 2016-07-08 DIAGNOSIS — N401 Enlarged prostate with lower urinary tract symptoms: Secondary | ICD-10-CM | POA: Diagnosis not present

## 2016-07-08 DIAGNOSIS — R3912 Poor urinary stream: Secondary | ICD-10-CM | POA: Diagnosis not present

## 2016-07-28 ENCOUNTER — Other Ambulatory Visit: Payer: Self-pay | Admitting: Family Medicine

## 2016-08-05 ENCOUNTER — Encounter: Payer: Self-pay | Admitting: Cardiovascular Disease

## 2016-08-05 ENCOUNTER — Ambulatory Visit (INDEPENDENT_AMBULATORY_CARE_PROVIDER_SITE_OTHER): Payer: Medicare Other | Admitting: Cardiovascular Disease

## 2016-08-05 ENCOUNTER — Ambulatory Visit (INDEPENDENT_AMBULATORY_CARE_PROVIDER_SITE_OTHER): Payer: Medicare Other | Admitting: Pharmacist

## 2016-08-05 VITALS — BP 110/69 | HR 63 | Ht 71.0 in | Wt 230.0 lb

## 2016-08-05 DIAGNOSIS — I4891 Unspecified atrial fibrillation: Secondary | ICD-10-CM | POA: Diagnosis not present

## 2016-08-05 DIAGNOSIS — I5032 Chronic diastolic (congestive) heart failure: Secondary | ICD-10-CM

## 2016-08-05 DIAGNOSIS — G4733 Obstructive sleep apnea (adult) (pediatric): Secondary | ICD-10-CM

## 2016-08-05 DIAGNOSIS — E669 Obesity, unspecified: Secondary | ICD-10-CM | POA: Diagnosis not present

## 2016-08-05 DIAGNOSIS — Z7901 Long term (current) use of anticoagulants: Secondary | ICD-10-CM

## 2016-08-05 DIAGNOSIS — I10 Essential (primary) hypertension: Secondary | ICD-10-CM

## 2016-08-05 DIAGNOSIS — I351 Nonrheumatic aortic (valve) insufficiency: Secondary | ICD-10-CM | POA: Diagnosis not present

## 2016-08-05 DIAGNOSIS — I480 Paroxysmal atrial fibrillation: Secondary | ICD-10-CM

## 2016-08-05 LAB — POCT INR: INR: 2.9

## 2016-08-05 MED ORDER — WARFARIN SODIUM 4 MG PO TABS: ORAL_TABLET | ORAL | 1 refills | Status: DC

## 2016-08-05 NOTE — Progress Notes (Signed)
Cardiology Office Note    Date:  08/05/2016   ID:  [name], [name] 1950-09-18, MRN [id number]  PCP:  Penni Homans, MD  Cardiologist:   Sanda Klein, MD   No chief complaint on file.   History of Present Illness:  [name] is a 66 y.o. male with aortic insufficiency, diastolic heart failure, paroxysmal atrial fibrillation and atrial flutter, obstructive sleep apnea, dyslipidemia in the setting of obesity and type 2 diabetes mellitus.  Continues with stable NYHA functional class II exertional dyspnea. Does not have orthopnea or PND and denies syncope, palpitations and leg edema. He has not had bleeding complications and denies new focal neurological complaints. His rhythm is regular today. He is on chronic treatment with Tikosyn, which has proven to be excellent for prevention of atrial arrhythmia in him. QTC today 450 ms.  Echocardiogram December 31, 2015:  normal left ventricular systolic function EF [id number], moderate LVH and moderate to severely dilated left atrium, suspicious for diastolic dysfunction and elevated filling pressures (although no comment was made on diastolic function on the report.). He has moderate aortic insufficiency due to dilated root (unchanged). The estimated systolic PA pressure was around 60 mmHg.[name] He has a long-standing history of severe hypertension which has not always been well treated. He also has long-standing history of obstructive sleep apnea which was also not treated until recently, but he is now 100% CPAP-compliant. Coronary angiography 2011 did not show evidence of significant stenoses. He presented with typical atrial flutter in 2011 and has recurrent paroxysmal atrial fibrillation with a good response to treatment with dofetilide. Additional problems include obesity, type 2 diabetes mellitus, gout and androgen deficiency. He had a successful cardioversion on March 25 2015 for persistent atrial fibrillation.   Past Medical History:  Diagnosis  Date  . AORTIC STENOSIS, MODERATE 12/05/2009  . Atrial fibrillation ([place])   . Atrial flutter ([place]) 12/05/2009  . Atypical chest pain 11/08/2011  . BENIGN PROSTATIC HYPERTROPHY, HX OF 12/05/2009  . CHEST PAIN-UNSPECIFIED 11/11/2009  . CHF 12/05/2009  . Debility 04/18/2012  . DM 12/05/2009  . DYSPNEA 12/19/2009  . Edema 04/18/2012  . ERECTILE DYSFUNCTION 01/25/2007  . FATIGUE, CHRONIC 11/11/2009  . Gout 07/10/2012  . Heart murmur   . Hyperkalemia 04/18/2012  . Hyperlipidemia   . Hypertension   . HYPERTENSION 01/25/2007  . Hypoxia 05/05/2011  . Insomnia 05/13/2016  . INSOMNIA, HX OF 01/25/2007  . Knee pain, bilateral 12/28/2010  . Knee pain, right 12/28/2010  . NEUROMA 01/05/2010  . OBESITY NOS 01/25/2007  . OSA (obstructive sleep apnea) 07/13/2014  . PERIPHERAL NEUROPATHY 01/05/2010  . PULMONARY FUNCTION TESTS, ABNORMAL 02/02/2010  . SLEEP APNEA 04/06/2010   uses cpap  . Superficial thrombophlebitis of left leg 09/07/2011  . TESTICULAR HYPOFUNCTION 01/05/2010  . 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 Toe pain, right 07/10/2012  . UNSPECIFIED ANEMIA 01/05/2010    Past Surgical History:  Procedure Laterality Date  . bilat foot surgery removed part of 5th metatarsal to corect curvature of toes    . CARDIAC CATHETERIZATION  10/16/2009   nonischemic cardiomyopathy  . CARDIOVERSION N/A 03/25/2015   Procedure: CARDIOVERSION;  Surgeon: Pixie Casino, MD;  Location: [facility] ENDOSCOPY;  Service: Cardiovascular;  Laterality: N/A;    Current Medications: Outpatient Medications Prior to Visit  Medication Sig Dispense Refill  . albuterol (VENTOLIN HFA) 108 (90 Base) MCG/ACT inhaler Inhale 2 puffs into the lungs every 6 (six) hours as needed for wheezing or shortness of breath. 1 Inhaler  6  . allopurinol (ZYLOPRIM) 300 MG tablet TAKE ONE TABLET BY MOUTH ONCE DAILY 90 tablet 2  . aspirin EC 81 MG tablet Take 81 mg by mouth daily.    . benazepril (LOTENSIN) 40 MG tablet TAKE ONE TABLET BY MOUTH ONCE DAILY 90 tablet 3  . calcium  carbonate (TUMS - DOSED IN MG ELEMENTAL CALCIUM) 500 MG chewable tablet Chew 1 tablet by mouth daily as needed. For heartburn    . cetirizine (ZYRTEC) 10 MG tablet Take 1 tablet (10 mg total) by mouth daily. 30 tablet 11  . Clobetasol Prop Emollient Base (CLOBETASOL PROPIONATE E) 0.05 % emollient cream Apply 1 application topically 2 (two) times daily. (Patient taking differently: Apply 1 application topically 2 (two) times daily as needed (insect bite). ) 60 g 0  . colchicine (COLCRYS) 0.6 MG tablet TAKE ONE TABLET BY MOUTH DAILY AS NEEDED FOR GOUT FLARE UPS. 30 tablet 0  . dofetilide (TIKOSYN) 500 MCG capsule Take 1 capsule (500 mcg total) by mouth 2 (two) times daily. 180 capsule 3  . fish oil-omega-3 fatty acids 1000 MG capsule Take 1 g by mouth daily.     . furosemide (LASIX) 40 MG tablet Take 2 tablets (80 mg total) by mouth every morning and take 1 tablet (40 mg total) by mouth every afternoon. 270 tablet 3  . glyBURIDE (DIABETA) 5 MG tablet TAKE ONE TABLET BY MOUTH ONCE DAILY WITH BREAKFAST 90 tablet 0  . HYDROcodone-acetaminophen (NORCO) 10-325 MG tablet Take 1 tablet by mouth every 8 (eight) hours as needed for moderate pain or severe pain. 90 tablet 0  . LORazepam (ATIVAN) 1 MG tablet Take 1 tablet (1 mg total) by mouth 2 (two) times daily as needed. for anxiety 60 tablet 3  . magnesium oxide (MAG-OX) 400 MG tablet Take 1 tablet (400 mg total) by mouth daily. 30 tablet 11  . metFORMIN (GLUCOPHAGE) 1000 MG tablet Take 1 tablet (1,000 mg total) by mouth 2 (two) times daily with a meal. 180 tablet 1  . metoprolol (LOPRESSOR) 100 MG tablet TAKE ONE TABLET BY MOUTH TWICE DAILY 180 tablet 3  . Multiple Vitamins-Minerals (MULTIVITAMIN PO) Take 1 tablet by mouth daily.    . mupirocin ointment (BACTROBAN) 2 % Place 1 application into the nose 2 (two) times daily. 30 g 1  . naproxen (NAPROSYN) 375 MG tablet Take 1 tablet (375 mg total) by mouth daily. 30 tablet 4  . potassium chloride SA  (K-DUR,KLOR-CON) 20 MEQ tablet Take 2 tablets (40 mEq total) by mouth every morning and take 1 tablet (20 mEq total) by mouth every evening. 270 tablet 3  . pravastatin (PRAVACHOL) 40 MG tablet TAKE ONE TABLET BY MOUTH ONCE DAILY 90 tablet 3  . temazepam (RESTORIL) 30 MG capsule TAKE ONE CAPSULE BY MOUTH AT BEDTIME AS NEEDED FOR SLEEP OR ANXIETY 30 capsule 3  . Testosterone 10 MG/ACT (2%) GEL Apply 1 application topically daily. Use as directed    . warfarin (COUMADIN) 4 MG tablet TAKE ONE TABLET BY MOUTH ONCE DAILY OR AS DIRECTED 90 tablet 1   No facility-administered medications prior to visit.      Allergies:   Sulfonamide derivatives and Zolpidem   Social History   Social History  . Marital status: Married    Spouse name: N/A  . Number of children: N/A  . Years of education: N/A   Social History Main Topics  . Smoking status: Former Smoker    Packs/day: 1.00    Years:  10.00    Quit date: 06/07/1980  . Smokeless tobacco: Never Used  . Alcohol use No  . Drug use: No  . Sexual activity: Not Currently   Other Topics Concern  . None   Social History Narrative  . None     Family History:  The patient's family history includes Aneurysm in his sister; Cancer in his father; Clotting disorder in his mother; Diabetes in his mother and sister; Heart attack in his mother; Heart disease in his mother; Hyperlipidemia in his mother; Hypertension in his mother and sister; Leukemia in his maternal grandmother; Lung disease in his father; Other in his sister; Seizures in his sister; Stroke in his mother.   ROS:   Please see the history of present illness.    ROS All other systems reviewed and are negative.   PHYSICAL EXAM:   VS:  BP 110/69 (BP Location: Right Arm, Patient Position: Sitting, Cuff Size: Normal)   Pulse 63   Ht 5\' 11"  (1.803 m)   Wt 104.3 kg (230 lb)   BMI 32.08 kg/m    GEN: Well nourished, well developed, in no acute distress  HEENT: normal  Neck: no JVD, carotid  bruits, or masses Cardiac: RRR, loud S2; 3/6 decrescendo aortic insufficiency murmur heard best at the left upper sternal border, rubs, or gallops,no edema  Respiratory:  clear to auscultation bilaterally, normal work of breathing GI: soft, nontender, nondistended, + BS MS: no deformity or atrophy  Skin: warm and dry, no rash Neuro:  Alert and Oriented x 3, Strength and sensation are intact Psych: euthymic mood, full affect  Wt Readings from Last 3 Encounters:  08/05/16 104.3 kg (230 lb)  05/13/16 102.2 kg (225 lb 4 oz)  05/06/16 101.6 kg (224 lb)      Studies/Labs Reviewed:   EKG:  EKG is ordered today.  The ekg ordered today demonstrates Sinus rhythm with mild first-degree AV block , QTC 450 ms, nonspecific and mild T-wave changes  Recent Labs: 05/21/2016: Hemoglobin 15.4; Magnesium 2.0; Platelets 230.0; TSH 1.59 06/02/2016: ALT 16; BUN 36; Creatinine, Ser 1.48; Potassium 5.0; Sodium 142   Lipid Panel    Component Value Date/Time   CHOL 151 05/21/2016 0847   TRIG 172.0 (H) 05/21/2016 0847   HDL 32.70 (L) 05/21/2016 0847   CHOLHDL 5 05/21/2016 0847   VLDL 34.4 05/21/2016 0847   LDLCALC 84 05/21/2016 0847     ASSESSMENT:    1. Chronic diastolic congestive heart failure (HCC)   2. Paroxysmal atrial fibrillation ([place])   3. Moderate aortic insufficiency   4. OSA (obstructive sleep apnea)   5. Obesity, mild   6. Essential hypertension, malignant      PLAN:  In order of problems listed above:  1. CHF (predominantly diastolic): It seems he has gained real weight. I guess we will consider today's weight as his new "dry weight".  2. AFib: Very good prevention since we started treatment with Tikosyn. He tolerated AF poorly, probably due to significant diastolic dysfunction. He had convincing evidence of tachycardia-related cardiomyopathy in 2011. He is at relatively high risk of embolic events and should continue warfarin anticoagulation without interruption. CHADSVAsc 3  (HTN, CHF, DM). Reviewed the risk of drug interactions with dofetilide and with warfarin. 3. Moderate AI: Unchanged by recent echo. Hypertension is likely the cause of his dilated aortic root, in turn the cause for his aortic valve insufficiency. We have discussed possible need for right left heart catheterization and aortic valve surgery if the  leak becomes severe. Monitor yearly 4. OSA: 100% Compliant with CPAP.  5. Obesity: repeated the recommendation for weight loss, discussed calorie restriction and physical exercise. He cannot exercise a lot because of severe knee arthritis. 6. Malignant hypertension: Blood pressure with excellent control on current regimen   Medication Adjustments/Labs and Tests Ordered: Current medicines are reviewed at length with the patient today.  Concerns regarding medicines are outlined above.  Medication changes, Labs and Tests ordered today are listed in the Patient Instructions below. Patient Instructions  Dr Sallyanne Kuster recommends that you schedule a follow-up appointment in 3 months.  If you need a refill on your cardiac medications before your next appointment, please call your pharmacy.    Signed, Sanda Klein, MD  08/05/2016 4:34 PM    [facility] [address], [place], [place]  [address] Phone: [phone]; Fax: [phone]

## 2016-08-05 NOTE — Patient Instructions (Signed)
Dr Croitoru recommends that you schedule a follow-up appointment in 3 months.  If you need a refill on your cardiac medications before your next appointment, please call your pharmacy. 

## 2016-08-11 NOTE — Progress Notes (Signed)
Subjective:   [name] is a 66 y.o. male who presents for Medicare Annual/Subsequent preventive examination.  He has chronic R knee pain for which he reports he has seen several orthopedic specialists who have all recommended knee replacement. At this time, he prefers to hold off on surgical intervention until it is absolutely necessary. He occasionally uses a cane, which helps to relieve some of the pressure from his knee. Today he is ambulating without an assistive device.  Review of Systems:  No ROS.  Medicare Wellness Visit.  Cardiac Risk Factors include: advanced age (>24men, >52 women);diabetes mellitus;obesity (BMI >30kg/m2);male gender;hypertension  Sleep patterns: has difficulty falling asleep, has frequent nighttime awakenings and gets up 1-2 times nightly to void. Chronic insomnia, stable on current meds. Wears CPAP 'religiously'-average wear 4-5 hours/night.  Home Safety/Smoke Alarms: Feels safe in home. Smoke alarms in place.   Living environment; residence and Firearm Safety: Lives w/ wife. 1-story house/ trailer, number of outside stairs: 3, can live on one level, firearms stored safely.  Counseling:   Eye Exam- pt reports he "jumps around" to different eye doctors. Has upcoming appointment for diabetic eye exam but he is not sure of the name of the practice. Dental- Does not follow w/ dentist regularly.  Male:   CCS- Never, he had previously planned on Cologuard, but ended up not doing it. At this time, he is not interested in pursuing colonoscopy or Cologuard.     PSA- Follows w/ Dr. Irine Seal every 3 months Lab Results  Component Value Date   PSA 4.26 (H) 12/29/2009       Objective:    Vitals: BP 114/60 (BP Location: Left Arm, Patient Position: Sitting, Cuff Size: Large)   Pulse 64   Temp 97.7 F (36.5 C) (Oral)   Wt 226 lb 9.6 oz (102.8 kg)   SpO2 98% Comment: RA  BMI 31.60 kg/m   Body mass index is 31.6 kg/m.  Tobacco History  Smoking Status    . Former Smoker  . Packs/day: 1.00  . Years: 10.00  . Quit date: 06/07/1980  Smokeless Tobacco  . Never Used     Counseling given: Not Answered   Past Medical History:  Diagnosis Date  . AORTIC STENOSIS, MODERATE 12/05/2009  . Atrial fibrillation ([place])   . Atrial flutter ([place]) 12/05/2009  . Atypical chest pain 11/08/2011  . BENIGN PROSTATIC HYPERTROPHY, HX OF 12/05/2009  . CHEST PAIN-UNSPECIFIED 11/11/2009  . CHF 12/05/2009  . Debility 04/18/2012  . DM 12/05/2009  . DYSPNEA 12/19/2009  . Edema 04/18/2012  . ERECTILE DYSFUNCTION 01/25/2007  . FATIGUE, CHRONIC 11/11/2009  . Gout 07/10/2012  . Heart murmur   . Hyperkalemia 04/18/2012  . Hyperlipidemia   . Hypertension   . HYPERTENSION 01/25/2007  . Hypoxia 05/05/2011  . Insomnia 05/13/2016  . INSOMNIA, HX OF 01/25/2007  . Knee pain, bilateral 12/28/2010  . Knee pain, right 12/28/2010  . NEUROMA 01/05/2010  . OBESITY NOS 01/25/2007  . OSA (obstructive sleep apnea) 07/13/2014  . PERIPHERAL NEUROPATHY 01/05/2010  . PULMONARY FUNCTION TESTS, ABNORMAL 02/02/2010  . SLEEP APNEA 04/06/2010   uses cpap  . Superficial thrombophlebitis of left leg 09/07/2011  . TESTICULAR HYPOFUNCTION 01/05/2010  . 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 Toe pain, right 07/10/2012  . UNSPECIFIED ANEMIA 01/05/2010   Past Surgical History:  Procedure Laterality Date  . bilat foot surgery removed part of 5th metatarsal to corect curvature of toes    . CARDIAC CATHETERIZATION  10/16/2009  nonischemic cardiomyopathy  . CARDIOVERSION N/A 03/25/2015   Procedure: CARDIOVERSION;  Surgeon: Pixie Casino, MD;  Location: [facility] ENDOSCOPY;  Service: Cardiovascular;  Laterality: N/A;   Family History  Problem Relation Age of Onset  . Clotting disorder Mother   . Heart disease Mother     s/p MI  . Heart attack Mother   . Hypertension Mother   . Diabetes Mother   . Hyperlipidemia Mother   . Stroke Mother   . Cancer Father     ? lung  . Lung disease Father     smoker  . Diabetes Sister   .  Hypertension Sister     smoker  . Leukemia Maternal Grandmother     ?  [name] Aneurysm Sister     brain  . Other Sister     clipped  . Seizures Sister     d/o w/aneurysm/ smoker   History  Sexual Activity  . Sexual activity: Not Currently    Outpatient Encounter Prescriptions as of 08/12/2016  Medication Sig  . albuterol (VENTOLIN HFA) 108 (90 Base) MCG/ACT inhaler Inhale 2 puffs into the lungs every 6 (six) hours as needed for wheezing or shortness of breath.  . allopurinol (ZYLOPRIM) 300 MG tablet TAKE ONE TABLET BY MOUTH ONCE DAILY  . aspirin EC 81 MG tablet Take 81 mg by mouth daily.  . benazepril (LOTENSIN) 40 MG tablet TAKE ONE TABLET BY MOUTH ONCE DAILY  . calcium carbonate (TUMS - DOSED IN MG ELEMENTAL CALCIUM) 500 MG chewable tablet Chew 1 tablet by mouth daily as needed. For heartburn  . cetirizine (ZYRTEC) 10 MG tablet Take 1 tablet (10 mg total) by mouth daily.  . Clobetasol Prop Emollient Base (CLOBETASOL PROPIONATE E) 0.05 % emollient cream Apply 1 application topically 2 (two) times daily as needed (insect bite).  . colchicine (COLCRYS) 0.6 MG tablet TAKE ONE TABLET BY MOUTH DAILY AS NEEDED FOR GOUT FLARE UPS.  [name] dofetilide (TIKOSYN) 500 MCG capsule Take 1 capsule (500 mcg total) by mouth 2 (two) times daily.  . fish oil-omega-3 fatty acids 1000 MG capsule Take 1 g by mouth daily.   . furosemide (LASIX) 40 MG tablet Take 2 tablets (80 mg total) by mouth every morning and take 1 tablet (40 mg total) by mouth every afternoon.  . glyBURIDE (DIABETA) 5 MG tablet TAKE ONE TABLET BY MOUTH ONCE DAILY WITH BREAKFAST  . HYDROcodone-acetaminophen (NORCO) 10-325 MG tablet Take 1 tablet by mouth every 8 (eight) hours as needed for moderate pain or severe pain.  . magnesium oxide (MAG-OX) 400 MG tablet Take 1 tablet (400 mg total) by mouth daily.  . metFORMIN (GLUCOPHAGE) 1000 MG tablet Take 1 tablet (1,000 mg total) by mouth 2 (two) times daily with a meal.  . metoprolol (LOPRESSOR) 100  MG tablet TAKE ONE TABLET BY MOUTH TWICE DAILY  . Multiple Vitamins-Minerals (MULTIVITAMIN PO) Take 1 tablet by mouth daily.  . mupirocin ointment (BACTROBAN) 2 % Place 1 application into the nose 2 (two) times daily.  . naproxen (NAPROSYN) 375 MG tablet Take 1 tablet (375 mg total) by mouth daily.  . potassium chloride SA (K-DUR,KLOR-CON) 20 MEQ tablet Take 2 tablets (40 mEq total) by mouth every morning and take 1 tablet (20 mEq total) by mouth every evening.  . pravastatin (PRAVACHOL) 40 MG tablet TAKE ONE TABLET BY MOUTH ONCE DAILY  . Testosterone 10 MG/ACT (2%) GEL Apply 1 application topically daily. Use as directed  . warfarin (COUMADIN) 4 MG tablet  Take 1/2 to 1 tablet daily or as directed by coumadin clinic  . [DISCONTINUED] Clobetasol Prop Emollient Base (CLOBETASOL PROPIONATE E) 0.05 % emollient cream Apply 1 application topically 2 (two) times daily. (Patient taking differently: Apply 1 application topically 2 (two) times daily as needed (insect bite). )  . [DISCONTINUED] HYDROcodone-acetaminophen (NORCO) 10-325 MG tablet Take 1 tablet by mouth every 8 (eight) hours as needed for moderate pain or severe pain.  . [DISCONTINUED] LORazepam (ATIVAN) 1 MG tablet Take 1 tablet (1 mg total) by mouth 2 (two) times daily as needed. for anxiety  . [DISCONTINUED] temazepam (RESTORIL) 30 MG capsule TAKE ONE CAPSULE BY MOUTH AT BEDTIME AS NEEDED FOR SLEEP OR ANXIETY  . LORazepam (ATIVAN) 1 MG tablet Take 1 tablet (1 mg total) by mouth every 8 (eight) hours as needed for anxiety.  . temazepam (RESTORIL) 30 MG capsule Take 1 capsule (30 mg total) by mouth at bedtime as needed for sleep.   No facility-administered encounter medications on file as of 08/12/2016.     Activities of Daily Living In your present state of health, do you have any difficulty performing the following activities: 08/12/2016  Hearing? N  Vision? N  Difficulty concentrating or making decisions? N  Walking or climbing stairs? Y    Dressing or bathing? N  Doing errands, shopping? Y  Preparing Food and eating ? N  Using the Toilet? N  In the past six months, have you accidently leaked urine? N  Do you have problems with loss of bowel control? N  Managing your Medications? N  Managing your Finances? N  Housekeeping or managing your Housekeeping? N  Some recent data might be hidden    Patient Care Team: [name], MD as PCP - General Sanda Klein, MD (Cardiology) Devra Dopp, MD as Consulting Physician (Dermatology) Rigoberto Noel, MD as Consulting Physician (Pulmonary Disease) Irine Seal, MD as Consulting Physician (Urology)   Assessment:    Physical assessment deferred to PCP.  Exercise Activities and Dietary recommendations Current Exercise Habits: The patient does not participate in regular exercise at present, Exercise limited by: cardiac condition(s);orthopedic condition(s)  Diet (meal preparation, eat out, water intake, caffeinated beverages, dairy products, fruits and vegetables): in general, a "healthy" diet  , well balanced, on average, 2-3 meals per day. 'I have a pretty good diet.' No pork or beef, very little fried food. 'I drink tons of water.'  Goals    . Maintain current health status      Foot Exam Diabetic Foot Exam - Simple   Simple Foot Form Diabetic Foot exam was performed with the following findings:  Yes 08/12/2016 10:57 AM  Visual Inspection No deformities, no ulcerations, no other skin breakdown bilaterally:  Yes Sensation Testing Intact to touch and monofilament testing bilaterally:  Yes See comments:  Yes Pulse Check Posterior Tibialis and Dorsalis pulse intact bilaterally:  Yes Comments Decreased sensation bilateral great toes. Baseline per patient.    Fall Risk Fall Risk  08/12/2016 06/23/2014  Falls in the past year? Yes No  Number falls in past yr: 2 or more -  Injury with Fall? No -  Risk for fall due to : History of fall(s) -   Depression  Screen PHQ 2/9 Scores 08/12/2016 06/23/2014  PHQ - 2 Score 0 0    Cognitive Function       Ad8 score reviewed for issues:  Issues making decisions: 0  Less interest in hobbies / activities: 0  Repeats questions,  stories (family complaining): 0  Trouble using ordinary gadgets (microwave, computer, phone): 0  Forgets the month or year: 0   Mismanaging finances: 0  Remembering appts: 0  Daily problems with thinking and/or memory: 0 Ad8 score is= 0  Immunization History  Administered Date(s) Administered  . Influenza Split 03/03/2011, 03/10/2012  . Influenza Whole 03/28/2008, 03/30/2010  . Influenza,inj,Quad PF,36+ Mos 01/31/2013, 03/07/2014, 04/14/2015, 02/19/2016  . Pneumococcal Polysaccharide-23 10/07/2009  . Td 09/05/2009   Screening Tests Health Maintenance  Topic Date Due  . OPHTHALMOLOGY EXAM  01/07/1963  . COLONOSCOPY  01/07/2003  . PNEUMOCOCCAL POLYSACCHARIDE VACCINE (2) 10/08/2014  . FOOT EXAM  07/14/2016  . HEMOGLOBIN A1C  11/19/2016  . TETANUS/TDAP  09/06/2019  . INFLUENZA VACCINE  Completed  . Hepatitis C Screening  Addressed  . HIV Screening  Addressed      Plan:    Follow-up w/ PCP as directed.  Continue to eat heart healthy diet (full of fruits, vegetables, whole grains, lean protein, water--limit salt, fat, and sugar intake) and increase physical activity as tolerated.  Declines all colon CA screening. Reviewed risks and screening recommendations. Pt states he will let us know when he is ready to consider CCS.  During the course of the visit the patient was educated and counseled about the following appropriate screening and preventive services:   Vaccines to include Pneumoccal, Influenza, Hepatitis B, Td, HCV  Cardiovascular Disease  Colorectal cancer screening  Diabetes screening  Prostate Cancer Screening   Glaucoma screening  Nutrition counseling   Patient Instructions (the written plan) was given to the patient.    Dorrene German, RN  08/12/2016

## 2016-08-12 ENCOUNTER — Ambulatory Visit (INDEPENDENT_AMBULATORY_CARE_PROVIDER_SITE_OTHER): Payer: Medicare Other | Admitting: Family Medicine

## 2016-08-12 ENCOUNTER — Encounter: Payer: Self-pay | Admitting: Family Medicine

## 2016-08-12 VITALS — BP 114/60 | HR 64 | Temp 97.7°F | Wt 226.6 lb

## 2016-08-12 DIAGNOSIS — E669 Obesity, unspecified: Secondary | ICD-10-CM | POA: Diagnosis not present

## 2016-08-12 DIAGNOSIS — I480 Paroxysmal atrial fibrillation: Secondary | ICD-10-CM

## 2016-08-12 DIAGNOSIS — M25561 Pain in right knee: Secondary | ICD-10-CM

## 2016-08-12 DIAGNOSIS — Z0000 Encounter for general adult medical examination without abnormal findings: Secondary | ICD-10-CM | POA: Diagnosis not present

## 2016-08-12 DIAGNOSIS — E1169 Type 2 diabetes mellitus with other specified complication: Secondary | ICD-10-CM

## 2016-08-12 DIAGNOSIS — E782 Mixed hyperlipidemia: Secondary | ICD-10-CM | POA: Diagnosis not present

## 2016-08-12 DIAGNOSIS — M1A9XX0 Chronic gout, unspecified, without tophus (tophi): Secondary | ICD-10-CM | POA: Diagnosis not present

## 2016-08-12 DIAGNOSIS — M25562 Pain in left knee: Secondary | ICD-10-CM

## 2016-08-12 MED ORDER — HYDROCODONE-ACETAMINOPHEN 10-325 MG PO TABS: 1.0000 | ORAL_TABLET | Freq: Three times a day (TID) | ORAL | 0 refills | Status: DC | PRN

## 2016-08-12 MED ORDER — LORAZEPAM 1 MG PO TABS: 1.0000 mg | ORAL_TABLET | Freq: Three times a day (TID) | ORAL | 3 refills | Status: DC | PRN

## 2016-08-12 MED ORDER — CLOBETASOL PROP EMOLLIENT BASE 0.05 % EX CREA: 1.0000 "application " | TOPICAL_CREAM | Freq: Two times a day (BID) | CUTANEOUS | 1 refills | Status: DC | PRN

## 2016-08-12 MED ORDER — TEMAZEPAM 30 MG PO CAPS: 30.0000 mg | ORAL_CAPSULE | Freq: Every evening | ORAL | 3 refills | Status: DC | PRN

## 2016-08-12 NOTE — Progress Notes (Signed)
Patient ID: [name], male   DOB: 1950-11-06, 66 y.o.   MRN: [id number]   Subjective:  I acted as a Education administrator for [name], [place], [place]   Patient ID: [name], male    DOB: [birth year], 66 y.o.   MRN: [id number]  Chief Complaint  Patient presents with  . Anemia    F/U  . AWV    With Hoyle Sauer at 0930.    Anemia  Presents for follow-up visit. Symptoms include malaise/fatigue. There has been no abdominal pain, fever or palpitations.  Hypertension  This is a chronic problem. The problem is controlled. Associated symptoms include malaise/fatigue and shortness of breath. Pertinent negatives include no blurred vision, chest pain, headaches or palpitations. Risk factors for coronary artery disease include diabetes mellitus and male gender.  Diabetes  He presents for his follow-up diabetic visit. He has type 2 diabetes mellitus. Pertinent negatives for hypoglycemia include no dizziness, headaches or nervousness/anxiousness. Pertinent negatives for diabetes include no blurred vision and no chest pain. Risk factors for coronary artery disease include hypertension, male sex and diabetes mellitus.    Patient is in today for a follow up on anemia. Patient also has a Hx of Type II Diabetes and HTN. Patient has an ongoing complaint of right knee pain. No additional acute concerns noted at this time. He continues to struggle with dyspnea on exertion but it is not worsening and he accepts that this is his baseline. No recent febrile illness or hospitalizations. Denies CP/palp/HA/congestion/fevers/GI or GU c/o. Taking meds as prescribed  Patient Care Team: [name], MD as PCP - General Sanda Klein, MD (Cardiology) Devra Dopp, MD as Consulting Physician (Dermatology) Rigoberto Noel, MD as Consulting Physician (Pulmonary Disease) Irine Seal, MD as Consulting Physician (Urology)   Past Medical History:  Diagnosis Date  . AORTIC STENOSIS, MODERATE 12/05/2009  . Atrial  fibrillation ([place])   . Atrial flutter ([place]) 12/05/2009  . Atypical chest pain 11/08/2011  . BENIGN PROSTATIC HYPERTROPHY, HX OF 12/05/2009  . CHEST PAIN-UNSPECIFIED 11/11/2009  . CHF 12/05/2009  . Debility 04/18/2012  . DM 12/05/2009  . DYSPNEA 12/19/2009  . Edema 04/18/2012  . ERECTILE DYSFUNCTION 01/25/2007  . FATIGUE, CHRONIC 11/11/2009  . Gout 07/10/2012  . Heart murmur   . Hyperkalemia 04/18/2012  . Hyperlipidemia   . Hypertension   . HYPERTENSION 01/25/2007  . Hypoxia 05/05/2011  . Insomnia 05/13/2016  . INSOMNIA, HX OF 01/25/2007  . Knee pain, bilateral 12/28/2010  . Knee pain, right 12/28/2010  . NEUROMA 01/05/2010  . OBESITY NOS 01/25/2007  . OSA (obstructive sleep apnea) 07/13/2014  . PERIPHERAL NEUROPATHY 01/05/2010  . PULMONARY FUNCTION TESTS, ABNORMAL 02/02/2010  . SLEEP APNEA 04/06/2010   uses cpap  . Superficial thrombophlebitis of left leg 09/07/2011  . TESTICULAR HYPOFUNCTION 01/05/2010  . 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 Toe pain, right 07/10/2012  . UNSPECIFIED ANEMIA 01/05/2010    Past Surgical History:  Procedure Laterality Date  . bilat foot surgery removed part of 5th metatarsal to corect curvature of toes    . CARDIAC CATHETERIZATION  10/16/2009   nonischemic cardiomyopathy  . CARDIOVERSION N/A 03/25/2015   Procedure: CARDIOVERSION;  Surgeon: Pixie Casino, MD;  Location: [facility] ENDOSCOPY;  Service: Cardiovascular;  Laterality: N/A;    Family History  Problem Relation Age of Onset  . Clotting disorder Mother   . Heart disease Mother     s/p MI  . Heart attack Mother   . Hypertension Mother   .  Diabetes Mother   . Hyperlipidemia Mother   . Stroke Mother   . Cancer Father     ? lung  . Lung disease Father     smoker  . Diabetes Sister   . Hypertension Sister     smoker  . Leukemia Maternal Grandmother     ?  [name] Aneurysm Sister     brain  . Other Sister     clipped  . Seizures Sister     d/o w/aneurysm/ smoker    Social History   Social History  . Marital status:  Married    Spouse name: N/A  . Number of children: N/A  . Years of education: N/A   Occupational History  . Not on file.   Social History Main Topics  . Smoking status: Former Smoker    Packs/day: 1.00    Years: 10.00    Quit date: 06/07/1980  . Smokeless tobacco: Never Used  . Alcohol use No  . Drug use: No  . Sexual activity: Not Currently   Other Topics Concern  . Not on file   Social History Narrative  . No narrative on file    Outpatient Medications Prior to Visit  Medication Sig Dispense Refill  . albuterol (VENTOLIN HFA) 108 (90 Base) MCG/ACT inhaler Inhale 2 puffs into the lungs every 6 (six) hours as needed for wheezing or shortness of breath. 1 Inhaler 6  . allopurinol (ZYLOPRIM) 300 MG tablet TAKE ONE TABLET BY MOUTH ONCE DAILY 90 tablet 2  . aspirin EC 81 MG tablet Take 81 mg by mouth daily.    . benazepril (LOTENSIN) 40 MG tablet TAKE ONE TABLET BY MOUTH ONCE DAILY 90 tablet 3  . calcium carbonate (TUMS - DOSED IN MG ELEMENTAL CALCIUM) 500 MG chewable tablet Chew 1 tablet by mouth daily as needed. For heartburn    . cetirizine (ZYRTEC) 10 MG tablet Take 1 tablet (10 mg total) by mouth daily. 30 tablet 11  . colchicine (COLCRYS) 0.6 MG tablet TAKE ONE TABLET BY MOUTH DAILY AS NEEDED FOR GOUT FLARE UPS. 30 tablet 0  . dofetilide (TIKOSYN) 500 MCG capsule Take 1 capsule (500 mcg total) by mouth 2 (two) times daily. 180 capsule 3  . fish oil-omega-3 fatty acids 1000 MG capsule Take 1 g by mouth daily.     . furosemide (LASIX) 40 MG tablet Take 2 tablets (80 mg total) by mouth every morning and take 1 tablet (40 mg total) by mouth every afternoon. 270 tablet 3  . glyBURIDE (DIABETA) 5 MG tablet TAKE ONE TABLET BY MOUTH ONCE DAILY WITH BREAKFAST 90 tablet 0  . magnesium oxide (MAG-OX) 400 MG tablet Take 1 tablet (400 mg total) by mouth daily. 30 tablet 11  . metFORMIN (GLUCOPHAGE) 1000 MG tablet Take 1 tablet (1,000 mg total) by mouth 2 (two) times daily with a meal. 180  tablet 1  . metoprolol (LOPRESSOR) 100 MG tablet TAKE ONE TABLET BY MOUTH TWICE DAILY 180 tablet 3  . Multiple Vitamins-Minerals (MULTIVITAMIN PO) Take 1 tablet by mouth daily.    . mupirocin ointment (BACTROBAN) 2 % Place 1 application into the nose 2 (two) times daily. 30 g 1  . naproxen (NAPROSYN) 375 MG tablet Take 1 tablet (375 mg total) by mouth daily. 30 tablet 4  . potassium chloride SA (K-DUR,KLOR-CON) 20 MEQ tablet Take 2 tablets (40 mEq total) by mouth every morning and take 1 tablet (20 mEq total) by mouth every evening. 270 tablet 3  .  pravastatin (PRAVACHOL) 40 MG tablet TAKE ONE TABLET BY MOUTH ONCE DAILY 90 tablet 3  . Testosterone 10 MG/ACT (2%) GEL Apply 1 application topically daily. Use as directed    . warfarin (COUMADIN) 4 MG tablet Take 1/2 to 1 tablet daily or as directed by coumadin clinic 90 tablet 1  . Clobetasol Prop Emollient Base (CLOBETASOL PROPIONATE E) 0.05 % emollient cream Apply 1 application topically 2 (two) times daily. (Patient taking differently: Apply 1 application topically 2 (two) times daily as needed (insect bite). ) 60 g 0  . HYDROcodone-acetaminophen (NORCO) 10-325 MG tablet Take 1 tablet by mouth every 8 (eight) hours as needed for moderate pain or severe pain. 90 tablet 0  . LORazepam (ATIVAN) 1 MG tablet Take 1 tablet (1 mg total) by mouth 2 (two) times daily as needed. for anxiety 60 tablet 3  . temazepam (RESTORIL) 30 MG capsule TAKE ONE CAPSULE BY MOUTH AT BEDTIME AS NEEDED FOR SLEEP OR ANXIETY 30 capsule 3   No facility-administered medications prior to visit.     Allergies  Allergen Reactions  . Sulfonamide Derivatives Other (See Comments)    unknown reaction.  Patient states had to go to the ER.   [name] Zolpidem Other (See Comments)    Excessive, prolonged sedation    Review of Systems  Constitutional: Positive for malaise/fatigue. Negative for fever.  HENT: Negative for congestion.   Eyes: Negative for blurred vision.  Respiratory:  Positive for shortness of breath.   Cardiovascular: Negative for chest pain, palpitations and leg swelling.  Gastrointestinal: Negative for abdominal pain, blood in stool and nausea.  Genitourinary: Negative for dysuria and frequency.  Musculoskeletal: Negative for falls.  Skin: Negative for rash.  Neurological: Negative for dizziness, loss of consciousness and headaches.  Endo/Heme/Allergies: Negative for environmental allergies.  Psychiatric/Behavioral: Negative for depression. The patient is not nervous/anxious.        Objective:    Physical Exam  Constitutional: He is oriented to person, place, and time. He appears well-developed and well-nourished. No distress.  HENT:  Head: Normocephalic and atraumatic.  Eyes: Conjunctivae are normal.  Neck: Normal range of motion. No thyromegaly present.  Cardiovascular: Normal rate and regular rhythm.   Murmur heard. Pulmonary/Chest: Effort normal and breath sounds normal. He has no wheezes.  Abdominal: Soft. Bowel sounds are normal. There is no tenderness.  Musculoskeletal: He exhibits no edema or deformity.  Neurological: He is alert and oriented to person, place, and time.  Skin: Skin is warm and dry. He is not diaphoretic.  Psychiatric: He has a normal mood and affect.    BP 114/60 (BP Location: Left Arm, Patient Position: Sitting, Cuff Size: Large)   Pulse 64   Temp 97.7 F (36.5 C) (Oral)   Wt 226 lb 9.6 oz (102.8 kg)   SpO2 98% Comment: RA  BMI 31.60 kg/m  Wt Readings from Last 3 Encounters:  08/12/16 226 lb 9.6 oz (102.8 kg)  08/05/16 230 lb (104.3 kg)  05/13/16 225 lb 4 oz (102.2 kg)     Lab Results  Component Value Date   WBC 5.4 05/21/2016   HGB 15.4 05/21/2016   HCT 46.7 05/21/2016   PLT 230.0 05/21/2016   GLUCOSE 154 (H) 06/02/2016   CHOL 151 05/21/2016   TRIG 172.0 (H) 05/21/2016   HDL 32.70 (L) 05/21/2016   LDLCALC 84 05/21/2016   ALT 16 06/02/2016   AST 12 06/02/2016   NA 142 06/02/2016   K 5.0  06/02/2016   CL  107 06/02/2016   CREATININE 1.48 06/02/2016   BUN 36 (H) 06/02/2016   CO2 26 06/02/2016   TSH 1.59 05/21/2016   PSA 4.26 (H) 12/29/2009   INR 2.9 08/05/2016   HGBA1C 6.8 (H) 05/21/2016   MICROALBUR 1.1 04/14/2015    Lab Results  Component Value Date   TSH 1.59 05/21/2016   Lab Results  Component Value Date   WBC 5.4 05/21/2016   HGB 15.4 05/21/2016   HCT 46.7 05/21/2016   MCV 81.9 05/21/2016   PLT 230.0 05/21/2016   Lab Results  Component Value Date   NA 142 06/02/2016   K 5.0 06/02/2016   CO2 26 06/02/2016   GLUCOSE 154 (H) 06/02/2016   BUN 36 (H) 06/02/2016   CREATININE 1.48 06/02/2016   BILITOT 0.4 06/02/2016   ALKPHOS 46 06/02/2016   AST 12 06/02/2016   ALT 16 06/02/2016   PROT 7.2 06/02/2016   ALBUMIN 4.3 06/02/2016   CALCIUM 9.9 06/02/2016   GFR 61.66 06/02/2016   Lab Results  Component Value Date   CHOL 151 05/21/2016   Lab Results  Component Value Date   HDL 32.70 (L) 05/21/2016   Lab Results  Component Value Date   LDLCALC 84 05/21/2016   Lab Results  Component Value Date   TRIG 172.0 (H) 05/21/2016   Lab Results  Component Value Date   CHOLHDL 5 05/21/2016   Lab Results  Component Value Date   HGBA1C 6.8 (H) 05/21/2016       Assessment & Plan:   Problem List Items Addressed This Visit    Diabetes mellitus type 2 in obese ([place])    hgba1c acceptable, minimize simple carbs. Increase exercise as tolerated. Continue current meds      Relevant Orders   Hemoglobin A1c   Hemoglobin A1c   Hyperlipidemia    Tolerating statin, encouraged heart healthy diet, avoid trans fats, minimize simple carbs and saturated fats. Increase exercise as tolerated      Relevant Orders   Lipid panel   Lipid panel   Knee pain, bilateral   Relevant Medications   HYDROcodone-acetaminophen (NORCO) 10-325 MG tablet   Atrial fibrillation, converted to SR after 2nd dose of Tikosyn.    RRR today. Tolerating Coumadin, following with  cardiology      Relevant Orders   CBC   Comprehensive metabolic panel   TSH   CBC   TSH   Comprehensive metabolic panel   Gout    Doing well no recent flare      Relevant Orders   Uric acid   Uric acid    Other Visit Diagnoses    Encounter for Medicare annual wellness exam    -  Primary      I have discontinued Mr. Sanna temazepam and LORazepam. I have also changed his Clobetasol Prop Emollient Base. Additionally, I am having him start on temazepam and LORazepam. Lastly, I am having him maintain his fish oil-omega-3 fatty acids, calcium carbonate, aspirin EC, Multiple Vitamins-Minerals (MULTIVITAMIN PO), Testosterone, mupirocin ointment, cetirizine, magnesium oxide, albuterol, metoprolol, pravastatin, benazepril, colchicine, dofetilide, allopurinol, furosemide, potassium chloride SA, metFORMIN, naproxen, glyBURIDE, warfarin, and HYDROcodone-acetaminophen.  Meds ordered this encounter  Medications  . Clobetasol Prop Emollient Base (CLOBETASOL PROPIONATE E) 0.05 % emollient cream    Sig: Apply 1 application topically 2 (two) times daily as needed (insect bite).    Dispense:  60 g    Refill:  1  . HYDROcodone-acetaminophen (NORCO) 10-325 MG tablet    Sig: Take  1 tablet by mouth every 8 (eight) hours as needed for moderate pain or severe pain.    Dispense:  90 tablet    Refill:  0  . temazepam (RESTORIL) 30 MG capsule    Sig: Take 1 capsule (30 mg total) by mouth at bedtime as needed for sleep.    Dispense:  30 capsule    Refill:  3  . LORazepam (ATIVAN) 1 MG tablet    Sig: Take 1 tablet (1 mg total) by mouth every 8 (eight) hours as needed for anxiety.    Dispense:  30 tablet    Refill:  3    CMA served as scribe during this visit. History, Physical and Plan performed by medical provider. Documentation and orders reviewed and attested to.  Kevin Homans, MD

## 2016-08-12 NOTE — Assessment & Plan Note (Signed)
hgba1c acceptable, minimize simple carbs. Increase exercise as tolerated. Continue current meds 

## 2016-08-12 NOTE — Assessment & Plan Note (Addendum)
RRR today. Tolerating Coumadin, following with cardiology

## 2016-08-12 NOTE — Assessment & Plan Note (Signed)
Doing well no recent flare

## 2016-08-12 NOTE — Patient Instructions (Addendum)
Encouraged to apply Solanpas, Aspercreme or Icy Hot (with lidocaine) to your knee   Carbohydrate Counting for Diabetes Mellitus, Adult Carbohydrate counting is a method for keeping track of how many carbohydrates you eat. Eating carbohydrates naturally increases the amount of sugar (glucose) in the blood. Counting how many carbohydrates you eat helps keep your blood glucose within normal limits, which helps you manage your diabetes (diabetes mellitus). It is important to know how many carbohydrates you can safely have in each meal. This is different for every person. A diet and nutrition specialist (registered dietitian) can help you make a meal plan and calculate how many carbohydrates you should have at each meal and snack. Carbohydrates are found in the following foods:  Grains, such as breads and cereals.  Dried beans and soy products.  Starchy vegetables, such as potatoes, peas, and corn.  Fruit and fruit juices.  Milk and yogurt.  Sweets and snack foods, such as cake, cookies, candy, chips, and soft drinks. How do I count carbohydrates? There are two ways to count carbohydrates in food. You can use either of the methods or a combination of both. Reading "Nutrition Facts" on packaged food  The "Nutrition Facts" list is included on the labels of almost all packaged foods and beverages in the [place] It includes:  The serving size.  Information about nutrients in each serving, including the grams (g) of carbohydrate per serving. To use the "Nutrition Facts":  Decide how many servings you will have.  Multiply the number of servings by the number of carbohydrates per serving.  The resulting number is the total amount of carbohydrates that you will be having. Learning standard serving sizes of other foods  When you eat foods containing carbohydrates that are not packaged or do not include "Nutrition Facts" on the label, you need to measure the servings in order to count the amount of  carbohydrates:  Measure the foods that you will eat with a food scale or measuring cup, if needed.  Decide how many standard-size servings you will eat.  Multiply the number of servings by 15. Most carbohydrate-rich foods have about 15 g of carbohydrates per serving.  For example, if you eat 8 oz (170 g) of strawberries, you will have eaten 2 servings and 30 g of carbohydrates (2 servings x 15 g = 30 g).  For foods that have more than one food mixed, such as soups and casseroles, you must count the carbohydrates in each food that is included. The following list contains standard serving sizes of common carbohydrate-rich foods. Each of these servings has about 15 g of carbohydrates:   hamburger bun or  English muffin.   oz (15 mL) syrup.   oz (14 g) jelly.  1 slice of bread.  1 six-inch tortilla.  3 oz (85 g) cooked rice or pasta.  4 oz (113 g) cooked dried beans.  4 oz (113 g) starchy vegetable, such as peas, corn, or potatoes.  4 oz (113 g) hot cereal.  4 oz (113 g) mashed potatoes or  of a large baked potato.  4 oz (113 g) canned or frozen fruit.  4 oz (120 mL) fruit juice.  4-6 crackers.  6 chicken nuggets.  6 oz (170 g) unsweetened dry cereal.  6 oz (170 g) plain fat-free yogurt or yogurt sweetened with artificial sweeteners.  8 oz (240 mL) milk.  8 oz (170 g) fresh fruit or one small piece of fruit.  24 oz (680 g) popped popcorn. Example  of carbohydrate counting Sample meal   3 oz (85 g) chicken breast.  6 oz (170 g) brown rice.  4 oz (113 g) corn.  8 oz (240 mL) milk.  8 oz (170 g) strawberries with sugar-free whipped topping. Carbohydrate calculation  1. Identify the foods that contain carbohydrates:  Rice.  Corn.  Milk.  Strawberries. 2. Calculate how many servings you have of each food:  2 servings rice.  1 serving corn.  1 serving milk.  1 serving strawberries. 3. Multiply each number of servings by 15 g:  2 servings  rice x 15 g = 30 g.  1 serving corn x 15 g = 15 g.  1 serving milk x 15 g = 15 g.  1 serving strawberries x 15 g = 15 g. 4. Add together all of the amounts to find the total grams of carbohydrates eaten:  30 g + 15 g + 15 g + 15 g = 75 g of carbohydrates total. This information is not intended to replace advice given to you by your health care provider. Make sure you discuss any questions you have with your health care provider. Document Released: 05/24/2005 Document Revised: 12/12/2015 Document Reviewed: 11/05/2015 Elsevier Interactive Patient Education  2017 [employer].

## 2016-08-12 NOTE — Assessment & Plan Note (Signed)
Tolerating statin, encouraged heart healthy diet, avoid trans fats, minimize simple carbs and saturated fats. Increase exercise as tolerated 

## 2016-08-12 NOTE — Progress Notes (Signed)
Pre visit review using our clinic review tool, if applicable. No additional management support is needed unless otherwise documented below in the visit note. 

## 2016-08-20 ENCOUNTER — Other Ambulatory Visit: Payer: Self-pay | Admitting: Family Medicine

## 2016-08-20 ENCOUNTER — Other Ambulatory Visit (INDEPENDENT_AMBULATORY_CARE_PROVIDER_SITE_OTHER): Payer: Medicare Other

## 2016-08-20 DIAGNOSIS — D649 Anemia, unspecified: Secondary | ICD-10-CM | POA: Diagnosis not present

## 2016-08-20 DIAGNOSIS — I480 Paroxysmal atrial fibrillation: Secondary | ICD-10-CM | POA: Diagnosis not present

## 2016-08-20 DIAGNOSIS — I10 Essential (primary) hypertension: Secondary | ICD-10-CM

## 2016-08-20 DIAGNOSIS — E669 Obesity, unspecified: Secondary | ICD-10-CM

## 2016-08-20 DIAGNOSIS — E1169 Type 2 diabetes mellitus with other specified complication: Secondary | ICD-10-CM

## 2016-08-20 DIAGNOSIS — E782 Mixed hyperlipidemia: Secondary | ICD-10-CM | POA: Diagnosis not present

## 2016-08-20 DIAGNOSIS — M1A9XX0 Chronic gout, unspecified, without tophus (tophi): Secondary | ICD-10-CM

## 2016-08-20 LAB — LIPID PANEL
CHOL/HDL RATIO: 5
Cholesterol: 144 mg/dL (ref 0–200)
HDL: 31.7 mg/dL — ABNORMAL LOW (ref >39.00)
LDL Cholesterol: 84 mg/dL (ref 0–99)
NONHDL: 112.74
Triglycerides: 145 mg/dL (ref 0.0–149.0)
VLDL: 29 mg/dL (ref 0.0–40.0)

## 2016-08-20 LAB — CBC
HCT: 47 % (ref 39.0–52.0)
Hemoglobin: 15.4 g/dL (ref 13.0–17.0)
MCHC: 32.8 g/dL (ref 30.0–36.0)
MCV: 82.4 fl (ref 78.0–100.0)
Platelets: 229 10*3/uL (ref 150.0–400.0)
RBC: 5.7 Mil/uL (ref 4.22–5.81)
RDW: 16.2 % — AB (ref 11.5–15.5)
WBC: 4.2 10*3/uL (ref 4.0–10.5)

## 2016-08-20 LAB — COMPREHENSIVE METABOLIC PANEL
ALK PHOS: 43 U/L (ref 39–117)
ALT: 17 U/L (ref 0–53)
AST: 16 U/L (ref 0–37)
Albumin: 4.2 g/dL (ref 3.5–5.2)
BILIRUBIN TOTAL: 0.5 mg/dL (ref 0.2–1.2)
BUN: 31 mg/dL — AB (ref 6–23)
CO2: 27 meq/L (ref 19–32)
Calcium: 10.2 mg/dL (ref 8.4–10.5)
Chloride: 105 mEq/L (ref 96–112)
Creatinine, Ser: 1.66 mg/dL — ABNORMAL HIGH (ref 0.40–1.50)
GFR: 53.97 mL/min — ABNORMAL LOW (ref >60.00)
GLUCOSE: 127 mg/dL — AB (ref 70–99)
POTASSIUM: 5 meq/L (ref 3.5–5.1)
SODIUM: 137 meq/L (ref 135–145)
TOTAL PROTEIN: 7.2 g/dL (ref 6.0–8.3)

## 2016-08-20 LAB — URIC ACID: Uric Acid, Serum: 5.5 mg/dL (ref 4.0–7.8)

## 2016-08-20 LAB — HEMOGLOBIN A1C: HEMOGLOBIN A1C: 7.5 % — AB (ref 4.6–6.5)

## 2016-08-20 LAB — TSH: TSH: 1.39 u[IU]/mL (ref 0.35–4.50)

## 2016-08-20 LAB — MAGNESIUM: MAGNESIUM: 1.8 mg/dL (ref 1.5–2.5)

## 2016-08-20 MED ORDER — GLYBURIDE 5 MG PO TABS: ORAL_TABLET | ORAL | 1 refills | Status: DC

## 2016-08-20 MED ORDER — METFORMIN HCL 500 MG PO TABS: 500.0000 mg | ORAL_TABLET | Freq: Three times a day (TID) | ORAL | 1 refills | Status: DC

## 2016-08-30 ENCOUNTER — Other Ambulatory Visit: Payer: Self-pay | Admitting: Cardiovascular Disease

## 2016-09-16 ENCOUNTER — Ambulatory Visit (INDEPENDENT_AMBULATORY_CARE_PROVIDER_SITE_OTHER): Payer: Medicare Other | Admitting: Pharmacist Clinician (PhC)/ Clinical Pharmacy Specialist

## 2016-09-16 DIAGNOSIS — Z7901 Long term (current) use of anticoagulants: Secondary | ICD-10-CM | POA: Diagnosis not present

## 2016-09-16 DIAGNOSIS — I4891 Unspecified atrial fibrillation: Secondary | ICD-10-CM

## 2016-09-16 LAB — POCT INR: INR: 2.4

## 2016-09-19 ENCOUNTER — Other Ambulatory Visit: Payer: Self-pay | Admitting: Family Medicine

## 2016-09-28 ENCOUNTER — Encounter: Payer: Medicare Other | Admitting: Family

## 2016-09-28 DIAGNOSIS — L300 Nummular dermatitis: Secondary | ICD-10-CM | POA: Diagnosis not present

## 2016-11-08 ENCOUNTER — Ambulatory Visit (INDEPENDENT_AMBULATORY_CARE_PROVIDER_SITE_OTHER): Payer: Medicare Other | Admitting: Pharmacist Clinician (PhC)/ Clinical Pharmacy Specialist

## 2016-11-08 ENCOUNTER — Ambulatory Visit (INDEPENDENT_AMBULATORY_CARE_PROVIDER_SITE_OTHER): Payer: Medicare Other | Admitting: Cardiovascular Disease

## 2016-11-08 ENCOUNTER — Encounter: Payer: Self-pay | Admitting: Cardiovascular Disease

## 2016-11-08 VITALS — BP 116/72 | HR 68 | Ht 71.0 in | Wt 225.0 lb

## 2016-11-08 DIAGNOSIS — G4733 Obstructive sleep apnea (adult) (pediatric): Secondary | ICD-10-CM | POA: Diagnosis not present

## 2016-11-08 DIAGNOSIS — I5032 Chronic diastolic (congestive) heart failure: Secondary | ICD-10-CM

## 2016-11-08 DIAGNOSIS — Z6831 Body mass index (BMI) 31.0-31.9, adult: Secondary | ICD-10-CM | POA: Diagnosis not present

## 2016-11-08 DIAGNOSIS — Z79899 Other long term (current) drug therapy: Secondary | ICD-10-CM | POA: Diagnosis not present

## 2016-11-08 DIAGNOSIS — I10 Essential (primary) hypertension: Secondary | ICD-10-CM | POA: Diagnosis not present

## 2016-11-08 DIAGNOSIS — I351 Nonrheumatic aortic (valve) insufficiency: Secondary | ICD-10-CM | POA: Diagnosis not present

## 2016-11-08 DIAGNOSIS — Z7901 Long term (current) use of anticoagulants: Secondary | ICD-10-CM

## 2016-11-08 DIAGNOSIS — E6609 Other obesity due to excess calories: Secondary | ICD-10-CM

## 2016-11-08 DIAGNOSIS — I4891 Unspecified atrial fibrillation: Secondary | ICD-10-CM

## 2016-11-08 DIAGNOSIS — I480 Paroxysmal atrial fibrillation: Secondary | ICD-10-CM | POA: Diagnosis not present

## 2016-11-08 DIAGNOSIS — Z5181 Encounter for therapeutic drug level monitoring: Secondary | ICD-10-CM | POA: Diagnosis not present

## 2016-11-08 LAB — POCT INR: INR: 2.7

## 2016-11-08 NOTE — Progress Notes (Signed)
Cardiology Office Note    Date:  11/10/2016   ID:  Riggin, Cuttino 01/29/1951, MRN [id number]  PCP:  [name], MD  Cardiologist:   Sanda Klein, MD   Chief Complaint  Patient presents with  . Follow-up    3 months  . Shortness of Breath  . Edema    Both legs towards the end of the day.    History of Present Illness:  [name] is a 66 y.o. male with aortic insufficiency, diastolic heart failure, paroxysmal atrial fibrillation and atrial flutter, obstructive sleep apnea, dyslipidemia in the setting of obesity and type 2 diabetes mellitus.  He has not had bleeding complications and denies new focal neurological complaints. His rhythm is regular todayAnd his ECG shows normal sinus rhythm. He is on chronic treatment with Tikosyn, which has proven to be excellent for prevention of atrial arrhythmia in him. QTC today 450 ms.  The patient specifically denies any chest pain at rest or exertion, orthopnea, paroxysmal nocturnal dyspnea, syncope, palpitations, focal neurological deficits, intermittent claudication, lower extremity edema, unexplained weight gain, cough, hemoptysis or wheezing. He has chronic NYHA functional class II dyspnea with exertion. About once or twice a week he'll have brief episodes of shortness of breath at rest that last for 20 or 30 minutes and seemed to correlate with faster heartbeats.  His weight is steady at 225 pounds (his home scale shows 5 pounds less). He has not had any bleeding problems on chronic anticoagulation with warfarin he takes NSAIDs extremely rarely for joint problems. He is still taking aspirin 81 mg daily.  Echocardiogram December 31, 2015:  normal left ventricular systolic function EF 56%, moderate LVH and moderate to severely dilated left atrium, suspicious for diastolic dysfunction and elevated filling pressures (although no comment was made on diastolic function on the report.). He has moderate aortic insufficiency due to dilated  root (unchanged). The estimated systolic PA pressure was around 60 mmHg.[name] He has a long-standing history of severe hypertension which has not always been well treated. He also has long-standing history of obstructive sleep apnea which was also not treated until recently, but he is now 100% CPAP-compliant. Coronary angiography 2011 did not show evidence of significant stenoses. He presented with typical atrial flutter in 2011 and has recurrent paroxysmal atrial fibrillation with a good response to treatment with dofetilide. Additional problems include obesity, type 2 diabetes mellitus, gout and androgen deficiency. He had a successful cardioversion on March 25 2015 for persistent atrial fibrillation.   Past Medical History:  Diagnosis Date  . AORTIC STENOSIS, MODERATE 12/05/2009  . Atrial fibrillation ([place])   . Atrial flutter ([place]) 12/05/2009  . Atypical chest pain 11/08/2011  . BENIGN PROSTATIC HYPERTROPHY, HX OF 12/05/2009  . CHEST PAIN-UNSPECIFIED 11/11/2009  . CHF 12/05/2009  . Debility 04/18/2012  . DM 12/05/2009  . DYSPNEA 12/19/2009  . Edema 04/18/2012  . ERECTILE DYSFUNCTION 01/25/2007  . FATIGUE, CHRONIC 11/11/2009  . Gout 07/10/2012  . Heart murmur   . Hyperkalemia 04/18/2012  . Hyperlipidemia   . Hypertension   . HYPERTENSION 01/25/2007  . Hypoxia 05/05/2011  . Insomnia 05/13/2016  . INSOMNIA, HX OF 01/25/2007  . Knee pain, bilateral 12/28/2010  . Knee pain, right 12/28/2010  . NEUROMA 01/05/2010  . OBESITY NOS 01/25/2007  . OSA (obstructive sleep apnea) 07/13/2014  . PERIPHERAL NEUROPATHY 01/05/2010  . PULMONARY FUNCTION TESTS, ABNORMAL 02/02/2010  . SLEEP APNEA 04/06/2010   uses cpap  . Superficial thrombophlebitis of left  leg 09/07/2011  . TESTICULAR HYPOFUNCTION 01/05/2010  . 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 Toe pain, right 07/10/2012  . UNSPECIFIED ANEMIA 01/05/2010    Past Surgical History:  Procedure Laterality Date  . bilat foot surgery removed part of 5th metatarsal to corect curvature of toes      . CARDIAC CATHETERIZATION  10/16/2009   nonischemic cardiomyopathy  . CARDIOVERSION N/A 03/25/2015   Procedure: CARDIOVERSION;  Surgeon: Pixie Casino, MD;  Location: [facility] ENDOSCOPY;  Service: Cardiovascular;  Laterality: N/A;    Current Medications: Outpatient Medications Prior to Visit  Medication Sig Dispense Refill  . albuterol (VENTOLIN HFA) 108 (90 Base) MCG/ACT inhaler Inhale 2 puffs into the lungs every 6 (six) hours as needed for wheezing or shortness of breath. 1 Inhaler 6  . allopurinol (ZYLOPRIM) 300 MG tablet TAKE ONE TABLET BY MOUTH ONCE DAILY 90 tablet 2  . aspirin EC 81 MG tablet Take 81 mg by mouth daily.    . benazepril (LOTENSIN) 40 MG tablet TAKE ONE TABLET BY MOUTH ONCE DAILY 90 tablet 3  . calcium carbonate (TUMS - DOSED IN MG ELEMENTAL CALCIUM) 500 MG chewable tablet Chew 1 tablet by mouth daily as needed. For heartburn    . cetirizine (ZYRTEC) 10 MG tablet Take 1 tablet (10 mg total) by mouth daily. 30 tablet 11  . Clobetasol Prop Emollient Base (CLOBETASOL PROPIONATE E) 0.05 % emollient cream Apply 1 application topically 2 (two) times daily as needed (insect bite). 60 g 1  . colchicine (COLCRYS) 0.6 MG tablet TAKE ONE TABLET BY MOUTH DAILY AS NEEDED FOR GOUT FLARE UPS. 30 tablet 0  . dofetilide (TIKOSYN) 500 MCG capsule Take 1 capsule (500 mcg total) by mouth 2 (two) times daily. 180 capsule 3  . fish oil-omega-3 fatty acids 1000 MG capsule Take 1 g by mouth daily.     . furosemide (LASIX) 40 MG tablet Take 2 tablets (80 mg total) by mouth every morning and take 1 tablet (40 mg total) by mouth every afternoon. 270 tablet 3  . glyBURIDE (DIABETA) 5 MG tablet TAKE ONE TABLET BY MOUTH TWICE DAILY 180 tablet 1  . glyBURIDE (DIABETA) 5 MG tablet TAKE 1 TABLET BY MOUTH ONCE DAILY WITH BREAKFAST 90 tablet 0  . HYDROcodone-acetaminophen (NORCO) 10-325 MG tablet Take 1 tablet by mouth every 8 (eight) hours as needed for moderate pain or severe pain. 90 tablet 0  . LORazepam  (ATIVAN) 1 MG tablet Take 1 tablet (1 mg total) by mouth every 8 (eight) hours as needed for anxiety. 30 tablet 3  . magnesium oxide (MAG-OX) 400 MG tablet Take 1 tablet (400 mg total) by mouth daily. 30 tablet 11  . metFORMIN (GLUCOPHAGE) 500 MG tablet Take 1 tablet (500 mg total) by mouth 3 (three) times daily. 270 tablet 1  . metoprolol (LOPRESSOR) 100 MG tablet TAKE ONE TABLET BY MOUTH TWICE DAILY 180 tablet 3  . Multiple Vitamins-Minerals (MULTIVITAMIN PO) Take 1 tablet by mouth daily.    . mupirocin ointment (BACTROBAN) 2 % Place 1 application into the nose 2 (two) times daily. 30 g 1  . naproxen (NAPROSYN) 375 MG tablet Take 1 tablet (375 mg total) by mouth daily. 30 tablet 4  . potassium chloride SA (K-DUR,KLOR-CON) 20 MEQ tablet Take 2 tablets (40 mEq total) by mouth every morning and take 1 tablet (20 mEq total) by mouth every evening. 270 tablet 3  . pravastatin (PRAVACHOL) 40 MG tablet TAKE ONE TABLET BY MOUTH ONCE DAILY 90 tablet  3  . temazepam (RESTORIL) 30 MG capsule Take 1 capsule (30 mg total) by mouth at bedtime as needed for sleep. 30 capsule 3  . Testosterone 10 MG/ACT (2%) GEL Apply 1 application topically daily. Use as directed    . warfarin (COUMADIN) 4 MG tablet Take 1/2 to 1 tablet daily or as directed by coumadin clinic 90 tablet 1   No facility-administered medications prior to visit.      Allergies:   Sulfonamide derivatives and Zolpidem   Social History   Social History  . Marital status: Married    Spouse name: N/A  . Number of children: N/A  . Years of education: N/A   Social History Main Topics  . Smoking status: Former Smoker    Packs/day: 1.00    Years: 10.00    Quit date: 06/07/1980  . Smokeless tobacco: Never Used  . Alcohol use No  . Drug use: No  . Sexual activity: Not Currently   Other Topics Concern  . None   Social History Narrative  . None     Family History:  The patient's family history includes Aneurysm in his sister; Cancer in his  father; Clotting disorder in his mother; Diabetes in his mother and sister; Heart attack in his mother; Heart disease in his mother; Hyperlipidemia in his mother; Hypertension in his mother and sister; Leukemia in his maternal grandmother; Lung disease in his father; Other in his sister; Seizures in his sister; Stroke in his mother.   ROS:   Please see the history of present illness.    ROS All other systems reviewed and are negative.   PHYSICAL EXAM:   VS:  BP 116/72   Pulse 68   Ht 5\' 11"  (1.803 m)   Wt 225 lb (102.1 kg)   BMI 31.38 kg/m     General: Well nourished, well developed, Alert, oriented x3, no distress Head: no evidence of trauma, PERRL, EOMI, no exophtalmos or lid lag, no myxedema, no xanthelasma; normal ears, nose and oropharynx Neck: normal jugular venous pulsations and no hepatojugular reflux; brisk carotid pulses without delay and no carotid bruits Chest: clear to auscultation, no signs of consolidation by percussion or palpation, normal fremitus, symmetrical and full respiratory excursions Cardiovascular: normal position and quality of the apical impulse, regular rhythm, normal first and second heart sounds, no systolic murmurs, rubs or gallops, 3/6 decrescendo aortic insufficiency murmur heard best at the left upper sternal border Abdomen: no tenderness or distention, no masses by palpation, no abnormal pulsatility or arterial bruits, normal bowel sounds, no hepatosplenomegaly Extremities: no clubbing, cyanosis or edema; 2+ radial, ulnar and brachial pulses bilaterally; 2+ right femoral, posterior tibial and dorsalis pedis pulses; 2+ left femoral, posterior tibial and dorsalis pedis pulses; no subclavian or femoral bruits Neurological: grossly nonfocal Psych: euthymic mood, full affect  Wt Readings from Last 3 Encounters:  11/08/16 225 lb (102.1 kg)  08/12/16 226 lb 9.6 oz (102.8 kg)  08/05/16 230 lb (104.3 kg)      Studies/Labs Reviewed:   EKG:  EKG is ordered  today.  The ekg ordered today demonstrates Sinus rhythm with mild first-degree AV block , PR interval 218 ms, QTC 450 ms  Recent Labs: 08/20/2016: ALT 17; BUN 31; Creatinine, Ser 1.66; Hemoglobin 15.4; Magnesium 1.8; Platelets 229.0; Potassium 5.0; Sodium 137; TSH 1.39   Lipid Panel    Component Value Date/Time   CHOL 144 08/20/2016 0927   TRIG 145.0 08/20/2016 0927   HDL 31.70 (L) 08/20/2016 4098  CHOLHDL 5 08/20/2016 0927   VLDL 29.0 08/20/2016 0927   LDLCALC 84 08/20/2016 0927     ASSESSMENT:    1. Chronic diastolic congestive heart failure (HCC)   2. Paroxysmal atrial fibrillation ([place])   3. Moderate aortic insufficiency   4. OSA (obstructive sleep apnea)   5. Class 1 obesity due to excess calories with serious comorbidity and body mass index (BMI) of 31.0 to 31.9 in adult   6. Essential hypertension, malignant   7. Visit for monitoring Tikosyn therapy      PLAN:  In order of problems listed above:  1. CHF (predominantly diastolic): Weight is steady from his previous appointment at around 225 pounds (5 pounds less than his home scale). He is aware of the need for sodium restriction and daily weight monitoring. Stable NYHA functional class II exertional dyspnea.  2. AFib: Overall excellent response to treatment with Tikosyn. His occasional brief episodes of dyspnea at rest might represent breakthrough paroxysmal arrhythmia. He tolerated atrial fibrillation atrial flutter very poorly in the past due to significant hypertrophic cardiomyopathy and diastolic dysfunction. When he initially presented with sustained atrial flutter he had decreased left ventricular systolic function due to tachycardia related cardiomyopathy. He is at relatively high risk of embolic events and should continue warfarin anticoagulation without interruption.  CHADSVAsc 3 (HTN, CHF, DM). Reminded him of the risk of drug interactions with dofetilide and with warfarin. 3. Moderate AI: Stable severity on  follow-up echo. Hypertension is likely the cause of his dilated aortic root, in turn the cause for his aortic valve insufficiency. He understands that if the aortic insufficiency worsens or is is evidence of excessive left ventricular dilation he may need right and left heart catheterization followed by surgical repair. Plan repeat echo next year 4. OSA: He reports 100% Compliant with CPAP.  5. Obesity: He needs to lose weight. Unable to exercise much due to knee arthritis, but he can do better with caloric/carbohydrate restriction. 6. Malignant hypertension: Excellent control on current regimen 7. Tikosyn: QT interval in desirable range   Medication Adjustments/Labs and Tests Ordered: Current medicines are reviewed at length with the patient today.  Concerns regarding medicines are outlined above.  Medication changes, Labs and Tests ordered today are listed in the Patient Instructions below. Patient Instructions  Dr Sallyanne Kuster recommends that you schedule a follow-up appointment in 6 months. You will receive a reminder letter in the mail two months in advance. If you don't receive a letter, please call our office to schedule the follow-up appointment.  If you need a refill on your cardiac medications before your next appointment, please call your pharmacy.    Signed, Sanda Klein, MD  11/10/2016 4:03 PM    [facility] [address], [place], [place]  [address] Phone: [phone]; Fax: [phone]

## 2016-11-08 NOTE — Patient Instructions (Signed)
Dr Croitoru recommends that you schedule a follow-up appointment in 6 months. You will receive a reminder letter in the mail two months in advance. If you don't receive a letter, please call our office to schedule the follow-up appointment.  If you need a refill on your cardiac medications before your next appointment, please call your pharmacy. 

## 2016-11-10 DIAGNOSIS — Z5181 Encounter for therapeutic drug level monitoring: Secondary | ICD-10-CM | POA: Insufficient documentation

## 2016-11-10 DIAGNOSIS — Z79899 Other long term (current) drug therapy: Secondary | ICD-10-CM

## 2016-11-10 DIAGNOSIS — Z0000 Encounter for general adult medical examination without abnormal findings: Secondary | ICD-10-CM | POA: Insufficient documentation

## 2016-11-15 ENCOUNTER — Other Ambulatory Visit (INDEPENDENT_AMBULATORY_CARE_PROVIDER_SITE_OTHER): Payer: Medicare Other

## 2016-11-15 DIAGNOSIS — E1169 Type 2 diabetes mellitus with other specified complication: Secondary | ICD-10-CM | POA: Diagnosis not present

## 2016-11-15 DIAGNOSIS — M1A9XX0 Chronic gout, unspecified, without tophus (tophi): Secondary | ICD-10-CM

## 2016-11-15 DIAGNOSIS — E782 Mixed hyperlipidemia: Secondary | ICD-10-CM | POA: Diagnosis not present

## 2016-11-15 DIAGNOSIS — I480 Paroxysmal atrial fibrillation: Secondary | ICD-10-CM | POA: Diagnosis not present

## 2016-11-15 DIAGNOSIS — E669 Obesity, unspecified: Secondary | ICD-10-CM | POA: Diagnosis not present

## 2016-11-15 LAB — COMPREHENSIVE METABOLIC PANEL
ALT: 17 U/L (ref 0–53)
AST: 17 U/L (ref 0–37)
Albumin: 4.3 g/dL (ref 3.5–5.2)
Alkaline Phosphatase: 39 U/L (ref 39–117)
BUN: 31 mg/dL — AB (ref 6–23)
CHLORIDE: 106 meq/L (ref 96–112)
CO2: 27 mEq/L (ref 19–32)
Calcium: 10.1 mg/dL (ref 8.4–10.5)
Creatinine, Ser: 1.57 mg/dL — ABNORMAL HIGH (ref 0.40–1.50)
GFR: 57.51 mL/min — AB (ref >60.00)
Glucose, Bld: 147 mg/dL — ABNORMAL HIGH (ref 70–99)
POTASSIUM: 4.7 meq/L (ref 3.5–5.1)
SODIUM: 139 meq/L (ref 135–145)
Total Bilirubin: 0.6 mg/dL (ref 0.2–1.2)
Total Protein: 7.1 g/dL (ref 6.0–8.3)

## 2016-11-15 LAB — LIPID PANEL
Cholesterol: 118 mg/dL (ref 0–200)
HDL: 27.5 mg/dL — AB (ref >39.00)
LDL CALC: 61 mg/dL (ref 0–99)
NonHDL: 90.45
TRIGLYCERIDES: 149 mg/dL (ref 0.0–149.0)
Total CHOL/HDL Ratio: 4
VLDL: 29.8 mg/dL (ref 0.0–40.0)

## 2016-11-15 LAB — CBC
HEMATOCRIT: 46.2 % (ref 39.0–52.0)
HEMOGLOBIN: 15.1 g/dL (ref 13.0–17.0)
MCHC: 32.6 g/dL (ref 30.0–36.0)
MCV: 82.7 fl (ref 78.0–100.0)
Platelets: 194 10*3/uL (ref 150.0–400.0)
RBC: 5.59 Mil/uL (ref 4.22–5.81)
RDW: 17.1 % — ABNORMAL HIGH (ref 11.5–15.5)
WBC: 4.3 10*3/uL (ref 4.0–10.5)

## 2016-11-15 LAB — TSH: TSH: 1.29 u[IU]/mL (ref 0.35–4.50)

## 2016-11-15 LAB — HEMOGLOBIN A1C: Hgb A1c MFr Bld: 7 % — ABNORMAL HIGH (ref 4.6–6.5)

## 2016-11-15 LAB — URIC ACID: URIC ACID, SERUM: 5.4 mg/dL (ref 4.0–7.8)

## 2016-12-16 ENCOUNTER — Encounter: Payer: Self-pay | Admitting: Family Medicine

## 2016-12-16 ENCOUNTER — Ambulatory Visit (INDEPENDENT_AMBULATORY_CARE_PROVIDER_SITE_OTHER): Payer: Medicare Other | Admitting: Family Medicine

## 2016-12-16 DIAGNOSIS — M25561 Pain in right knee: Secondary | ICD-10-CM | POA: Diagnosis not present

## 2016-12-16 DIAGNOSIS — L309 Dermatitis, unspecified: Secondary | ICD-10-CM

## 2016-12-16 DIAGNOSIS — N189 Chronic kidney disease, unspecified: Secondary | ICD-10-CM

## 2016-12-16 DIAGNOSIS — Z6831 Body mass index (BMI) 31.0-31.9, adult: Secondary | ICD-10-CM

## 2016-12-16 DIAGNOSIS — E782 Mixed hyperlipidemia: Secondary | ICD-10-CM

## 2016-12-16 DIAGNOSIS — R790 Abnormal level of blood mineral: Secondary | ICD-10-CM | POA: Diagnosis not present

## 2016-12-16 DIAGNOSIS — E669 Obesity, unspecified: Secondary | ICD-10-CM | POA: Diagnosis not present

## 2016-12-16 DIAGNOSIS — E6609 Other obesity due to excess calories: Secondary | ICD-10-CM

## 2016-12-16 DIAGNOSIS — E1169 Type 2 diabetes mellitus with other specified complication: Secondary | ICD-10-CM | POA: Diagnosis not present

## 2016-12-16 DIAGNOSIS — R197 Diarrhea, unspecified: Secondary | ICD-10-CM | POA: Diagnosis not present

## 2016-12-16 DIAGNOSIS — I10 Essential (primary) hypertension: Secondary | ICD-10-CM

## 2016-12-16 DIAGNOSIS — M25562 Pain in left knee: Secondary | ICD-10-CM

## 2016-12-16 DIAGNOSIS — Z79899 Other long term (current) drug therapy: Secondary | ICD-10-CM | POA: Diagnosis not present

## 2016-12-16 DIAGNOSIS — Z79891 Long term (current) use of opiate analgesic: Secondary | ICD-10-CM | POA: Diagnosis not present

## 2016-12-16 HISTORY — DX: Diarrhea, unspecified: R19.7

## 2016-12-16 HISTORY — DX: Chronic kidney disease, unspecified: N18.9

## 2016-12-16 LAB — COMPREHENSIVE METABOLIC PANEL
ALBUMIN: 4.5 g/dL (ref 3.5–5.2)
ALT: 14 U/L (ref 0–53)
AST: 15 U/L (ref 0–37)
Alkaline Phosphatase: 40 U/L (ref 39–117)
BILIRUBIN TOTAL: 0.6 mg/dL (ref 0.2–1.2)
BUN: 26 mg/dL — AB (ref 6–23)
CALCIUM: 10.2 mg/dL (ref 8.4–10.5)
CO2: 28 meq/L (ref 19–32)
CREATININE: 1.52 mg/dL — AB (ref 0.40–1.50)
Chloride: 105 mEq/L (ref 96–112)
GFR: 59.69 mL/min — ABNORMAL LOW (ref >60.00)
Glucose, Bld: 155 mg/dL — ABNORMAL HIGH (ref 70–99)
Potassium: 4.3 mEq/L (ref 3.5–5.1)
Sodium: 145 mEq/L (ref 135–145)
Total Protein: 7.5 g/dL (ref 6.0–8.3)

## 2016-12-16 LAB — CBC WITH DIFFERENTIAL/PLATELET
BASOS PCT: 0.5 % (ref 0.0–3.0)
Basophils Absolute: 0 10*3/uL (ref 0.0–0.1)
EOS ABS: 0.1 10*3/uL (ref 0.0–0.7)
Eosinophils Relative: 2.7 % (ref 0.0–5.0)
HCT: 45 % (ref 39.0–52.0)
HEMOGLOBIN: 14.8 g/dL (ref 13.0–17.0)
LYMPHS ABS: 1.7 10*3/uL (ref 0.7–4.0)
Lymphocytes Relative: 35.3 % (ref 12.0–46.0)
MCHC: 32.9 g/dL (ref 30.0–36.0)
MCV: 83.6 fl (ref 78.0–100.0)
MONO ABS: 0.7 10*3/uL (ref 0.1–1.0)
Monocytes Relative: 14.7 % — ABNORMAL HIGH (ref 3.0–12.0)
Neutro Abs: 2.3 10*3/uL (ref 1.4–7.7)
Neutrophils Relative %: 46.8 % (ref 43.0–77.0)
Platelets: 212 10*3/uL (ref 150.0–400.0)
RBC: 5.38 Mil/uL (ref 4.22–5.81)
RDW: 16.6 % — AB (ref 11.5–15.5)
WBC: 5 10*3/uL (ref 4.0–10.5)

## 2016-12-16 LAB — MAGNESIUM: MAGNESIUM: 1.6 mg/dL (ref 1.5–2.5)

## 2016-12-16 MED ORDER — HYDROCODONE-ACETAMINOPHEN 10-325 MG PO TABS: 1.0000 | ORAL_TABLET | Freq: Three times a day (TID) | ORAL | 0 refills | Status: DC | PRN

## 2016-12-16 MED ORDER — DOFETILIDE 500 MCG PO CAPS: 500.0000 ug | ORAL_CAPSULE | Freq: Two times a day (BID) | ORAL | 3 refills | Status: DC

## 2016-12-16 MED ORDER — ALBUTEROL SULFATE HFA 108 (90 BASE) MCG/ACT IN AERS: 2.0000 | INHALATION_SPRAY | Freq: Four times a day (QID) | RESPIRATORY_TRACT | 6 refills | Status: AC | PRN

## 2016-12-16 MED ORDER — CLOBETASOL PROP EMOLLIENT BASE 0.05 % EX CREA: 1.0000 "application " | TOPICAL_CREAM | Freq: Two times a day (BID) | CUTANEOUS | 1 refills | Status: DC | PRN

## 2016-12-16 MED ORDER — BENAZEPRIL HCL 40 MG PO TABS: 40.0000 mg | ORAL_TABLET | Freq: Every day | ORAL | 3 refills | Status: DC

## 2016-12-16 NOTE — Progress Notes (Signed)
Subjective:  I acted as a Education administrator for Dr. Charlett Blake. [place], [place]  Patient ID: [name], male    DOB: 1950-10-30, 66 y.o.   MRN: [id number]  No chief complaint on file.   HPI  Patient is in today for a follow up. Patient c/o an upset stomach with diarrhea, denies vomiting. Also has a bug bite onhis lower left leg. No recent febrile illness or acute hospitalizations. Denies CP/palp/SOB/HA/congestion/fevers/GI or GU c/o. Taking meds as prescribed. Had a week's worth of upset stomach but it is improving now. He had several loose stool daily. No bloody or tarry stool. No fevers or chills.    Patient Care Team: [name], MD as PCP - General Croitoru, Dani Gobble, MD (Cardiology) Renda Rolls, Jennefer Bravo, MD as Consulting Physician (Dermatology) Rigoberto Noel, MD as Consulting Physician (Pulmonary Disease) Irine Seal, MD as Consulting Physician (Urology)   Past Medical History:  Diagnosis Date  . AORTIC STENOSIS, MODERATE 12/05/2009  . Atrial fibrillation ([place])   . Atrial flutter ([place]) 12/05/2009  . Atypical chest pain 11/08/2011  . BENIGN PROSTATIC HYPERTROPHY, HX OF 12/05/2009  . CHEST PAIN-UNSPECIFIED 11/11/2009  . CHF 12/05/2009  . Chronic renal insufficiency 12/16/2016  . Debility 04/18/2012  . Diarrhea 12/16/2016  . DM 12/05/2009  . DYSPNEA 12/19/2009  . Edema 04/18/2012  . ERECTILE DYSFUNCTION 01/25/2007  . FATIGUE, CHRONIC 11/11/2009  . Gout 07/10/2012  . Heart murmur   . Hyperkalemia 04/18/2012  . Hyperlipidemia   . Hypertension   . HYPERTENSION 01/25/2007  . Hypoxia 05/05/2011  . Insomnia 05/13/2016  . INSOMNIA, HX OF 01/25/2007  . Knee pain, bilateral 12/28/2010  . Knee pain, right 12/28/2010  . NEUROMA 01/05/2010  . OBESITY NOS 01/25/2007  . OSA (obstructive sleep apnea) 07/13/2014  . PERIPHERAL NEUROPATHY 01/05/2010  . PULMONARY FUNCTION TESTS, ABNORMAL 02/02/2010  . SLEEP APNEA 04/06/2010   uses cpap  . Superficial thrombophlebitis of left leg 09/07/2011  . TESTICULAR HYPOFUNCTION  01/05/2010  . 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 Toe pain, right 07/10/2012  . UNSPECIFIED ANEMIA 01/05/2010    Past Surgical History:  Procedure Laterality Date  . bilat foot surgery removed part of 5th metatarsal to corect curvature of toes    . CARDIAC CATHETERIZATION  10/16/2009   nonischemic cardiomyopathy  . CARDIOVERSION N/A 03/25/2015   Procedure: CARDIOVERSION;  Surgeon: Pixie Casino, MD;  Location: [facility] ENDOSCOPY;  Service: Cardiovascular;  Laterality: N/A;    Family History  Problem Relation Age of Onset  . Clotting disorder Mother   . Heart disease Mother        s/p MI  . Heart attack Mother   . Hypertension Mother   . Diabetes Mother   . Hyperlipidemia Mother   . Stroke Mother   . Cancer Father        ? lung  . Lung disease Father        smoker  . Diabetes Sister   . Hypertension Sister        smoker  . Leukemia Maternal Grandmother        ?  [name] Aneurysm Sister        brain  . Other Sister        clipped  . Seizures Sister        d/o w/aneurysm/ smoker    Social History   Social History  . Marital status: Married    Spouse name: N/A  . Number of children: N/A  . Years of education: N/A  Occupational History  . Not on file.   Social History Main Topics  . Smoking status: Former Smoker    Packs/day: 1.00    Years: 10.00    Quit date: 06/07/1980  . Smokeless tobacco: Never Used  . Alcohol use No  . Drug use: No  . Sexual activity: Not Currently   Other Topics Concern  . Not on file   Social History Narrative  . No narrative on file    Outpatient Medications Prior to Visit  Medication Sig Dispense Refill  . albuterol (VENTOLIN HFA) 108 (90 Base) MCG/ACT inhaler Inhale 2 puffs into the lungs every 6 (six) hours as needed for wheezing or shortness of breath. 1 Inhaler 6  . allopurinol (ZYLOPRIM) 300 MG tablet TAKE ONE TABLET BY MOUTH ONCE DAILY 90 tablet 2  . aspirin EC 81 MG tablet Take 81 mg by mouth daily.    . benazepril (LOTENSIN) 40 MG tablet  TAKE ONE TABLET BY MOUTH ONCE DAILY 90 tablet 3  . calcium carbonate (TUMS - DOSED IN MG ELEMENTAL CALCIUM) 500 MG chewable tablet Chew 1 tablet by mouth daily as needed. For heartburn    . cetirizine (ZYRTEC) 10 MG tablet Take 1 tablet (10 mg total) by mouth daily. 30 tablet 11  . Clobetasol Prop Emollient Base (CLOBETASOL PROPIONATE E) 0.05 % emollient cream Apply 1 application topically 2 (two) times daily as needed (insect bite). 60 g 1  . colchicine (COLCRYS) 0.6 MG tablet TAKE ONE TABLET BY MOUTH DAILY AS NEEDED FOR GOUT FLARE UPS. 30 tablet 0  . dofetilide (TIKOSYN) 500 MCG capsule Take 1 capsule (500 mcg total) by mouth 2 (two) times daily. 180 capsule 3  . fish oil-omega-3 fatty acids 1000 MG capsule Take 1 g by mouth daily.     . furosemide (LASIX) 40 MG tablet Take 2 tablets (80 mg total) by mouth every morning and take 1 tablet (40 mg total) by mouth every afternoon. 270 tablet 3  . glyBURIDE (DIABETA) 5 MG tablet TAKE ONE TABLET BY MOUTH TWICE DAILY 180 tablet 1  . glyBURIDE (DIABETA) 5 MG tablet TAKE 1 TABLET BY MOUTH ONCE DAILY WITH BREAKFAST 90 tablet 0  . HYDROcodone-acetaminophen (NORCO) 10-325 MG tablet Take 1 tablet by mouth every 8 (eight) hours as needed for moderate pain or severe pain. 90 tablet 0  . LORazepam (ATIVAN) 1 MG tablet Take 1 tablet (1 mg total) by mouth every 8 (eight) hours as needed for anxiety. 30 tablet 3  . magnesium oxide (MAG-OX) 400 MG tablet Take 1 tablet (400 mg total) by mouth daily. 30 tablet 11  . metFORMIN (GLUCOPHAGE) 500 MG tablet Take 1 tablet (500 mg total) by mouth 3 (three) times daily. 270 tablet 1  . metoprolol (LOPRESSOR) 100 MG tablet TAKE ONE TABLET BY MOUTH TWICE DAILY 180 tablet 3  . Multiple Vitamins-Minerals (MULTIVITAMIN PO) Take 1 tablet by mouth daily.    . mupirocin ointment (BACTROBAN) 2 % Place 1 application into the nose 2 (two) times daily. 30 g 1  . naproxen (NAPROSYN) 375 MG tablet Take 1 tablet (375 mg total) by mouth  daily. 30 tablet 4  . potassium chloride SA (K-DUR,KLOR-CON) 20 MEQ tablet Take 2 tablets (40 mEq total) by mouth every morning and take 1 tablet (20 mEq total) by mouth every evening. 270 tablet 3  . pravastatin (PRAVACHOL) 40 MG tablet TAKE ONE TABLET BY MOUTH ONCE DAILY 90 tablet 3  . temazepam (RESTORIL) 30 MG capsule Take 1  capsule (30 mg total) by mouth at bedtime as needed for sleep. 30 capsule 3  . Testosterone 10 MG/ACT (2%) GEL Apply 1 application topically daily. Use as directed    . warfarin (COUMADIN) 4 MG tablet Take 1/2 to 1 tablet daily or as directed by coumadin clinic 90 tablet 1   No facility-administered medications prior to visit.     Allergies  Allergen Reactions  . Sulfonamide Derivatives Other (See Comments)    unknown reaction.  Patient states had to go to the ER.   [name] Zolpidem Other (See Comments)    Excessive, prolonged sedation    Review of Systems  Constitutional: Positive for malaise/fatigue. Negative for fever.  HENT: Negative for congestion.   Eyes: Positive for redness. Negative for blurred vision.  Respiratory: Negative for cough and shortness of breath.   Cardiovascular: Negative for chest pain, palpitations and leg swelling.  Gastrointestinal: Positive for diarrhea. Negative for blood in stool, constipation, melena and vomiting.  Musculoskeletal: Negative for back pain.  Skin: Negative for rash.  Neurological: Negative for loss of consciousness and headaches.       Objective:    Physical Exam  Constitutional: He is oriented to person, place, and time. He appears well-developed and well-nourished. No distress.  HENT:  Head: Normocephalic and atraumatic.  Eyes: Conjunctivae are normal.  Neck: Normal range of motion. No thyromegaly present.  Cardiovascular: Normal rate and regular rhythm.   Murmur heard. Pulmonary/Chest: Effort normal and breath sounds normal. He has no wheezes.  Abdominal: Soft. Bowel sounds are normal. There is no  tenderness.  Musculoskeletal: Normal range of motion. He exhibits no edema or deformity.  Neurological: He is alert and oriented to person, place, and time.  Skin: Skin is warm and dry. He is not diaphoretic.  Psychiatric: He has a normal mood and affect.    There were no vitals taken for this visit. Wt Readings from Last 3 Encounters:  11/08/16 225 lb (102.1 kg)  08/12/16 226 lb 9.6 oz (102.8 kg)  08/05/16 230 lb (104.3 kg)   BP Readings from Last 3 Encounters:  11/08/16 116/72  08/12/16 114/60  08/05/16 110/69     Immunization History  Administered Date(s) Administered  . Influenza Split 03/03/2011, 03/10/2012  . Influenza Whole 03/28/2008, 03/30/2010  . Influenza,inj,Quad PF,36+ Mos 01/31/2013, 03/07/2014, 04/14/2015, 02/19/2016  . Pneumococcal Polysaccharide-23 10/07/2009  . Td 09/05/2009    Health Maintenance  Topic Date Due  . OPHTHALMOLOGY EXAM  01/07/1963  . COLONOSCOPY  01/07/2003  . PNEUMOCOCCAL POLYSACCHARIDE VACCINE (2) 10/08/2014  . INFLUENZA VACCINE  01/05/2017  . HEMOGLOBIN A1C  05/17/2017  . FOOT EXAM  08/12/2017  . TETANUS/TDAP  09/06/2019  . Hepatitis C Screening  Addressed  . HIV Screening  Addressed    Lab Results  Component Value Date   WBC 4.3 11/15/2016   HGB 15.1 11/15/2016   HCT 46.2 11/15/2016   PLT 194.0 11/15/2016   GLUCOSE 147 (H) 11/15/2016   CHOL 118 11/15/2016   TRIG 149.0 11/15/2016   HDL 27.50 (L) 11/15/2016   LDLCALC 61 11/15/2016   ALT 17 11/15/2016   AST 17 11/15/2016   NA 139 11/15/2016   K 4.7 11/15/2016   CL 106 11/15/2016   CREATININE 1.57 (H) 11/15/2016   BUN 31 (H) 11/15/2016   CO2 27 11/15/2016   TSH 1.29 11/15/2016   PSA 4.26 (H) 12/29/2009   INR 2.7 11/08/2016   HGBA1C 7.0 (H) 11/15/2016   MICROALBUR 1.1 04/14/2015  Lab Results  Component Value Date   TSH 1.29 11/15/2016   Lab Results  Component Value Date   WBC 4.3 11/15/2016   HGB 15.1 11/15/2016   HCT 46.2 11/15/2016   MCV 82.7 11/15/2016    PLT 194.0 11/15/2016   Lab Results  Component Value Date   NA 139 11/15/2016   K 4.7 11/15/2016   CO2 27 11/15/2016   GLUCOSE 147 (H) 11/15/2016   BUN 31 (H) 11/15/2016   CREATININE 1.57 (H) 11/15/2016   BILITOT 0.6 11/15/2016   ALKPHOS 39 11/15/2016   AST 17 11/15/2016   ALT 17 11/15/2016   PROT 7.1 11/15/2016   ALBUMIN 4.3 11/15/2016   CALCIUM 10.1 11/15/2016   GFR 57.51 (L) 11/15/2016   Lab Results  Component Value Date   CHOL 118 11/15/2016   Lab Results  Component Value Date   HDL 27.50 (L) 11/15/2016   Lab Results  Component Value Date   LDLCALC 61 11/15/2016   Lab Results  Component Value Date   TRIG 149.0 11/15/2016   Lab Results  Component Value Date   CHOLHDL 4 11/15/2016   Lab Results  Component Value Date   HGBA1C 7.0 (H) 11/15/2016         Assessment & Plan:   Problem List Items Addressed This Visit    Obesity    Encouraged DASH diet, decrease po intake and increase exercise as tolerated. Needs 7-8 hours of sleep nightly. Avoid trans fats, eat small, frequent meals every 4-5 hours with lean proteins, complex carbs and healthy fats. Minimize simple carbs, consider bariatric referral      Diarrhea    He thinks he got it from his grandson and wife who had it recently., he has had symptoms for past week or so. Encouraged clear liquids x 24  Hours then progress to [employer], call if worsens      Relevant Orders   Comprehensive metabolic panel   CBC w/Diff   Chronic renal insufficiency    Check cmp today       Other Visit Diagnoses    Low magnesium level    -  Primary   Relevant Orders   Magnesium      I am having Mr. Treml maintain his fish oil-omega-3 fatty acids, calcium carbonate, aspirin EC, Multiple Vitamins-Minerals (MULTIVITAMIN PO), Testosterone, mupirocin ointment, cetirizine, magnesium oxide, albuterol, benazepril, colchicine, dofetilide, allopurinol, furosemide, potassium chloride SA, naproxen, warfarin, Clobetasol Prop  Emollient Base, HYDROcodone-acetaminophen, temazepam, LORazepam, metFORMIN, glyBURIDE, pravastatin, metoprolol tartrate, and glyBURIDE.  No orders of the defined types were placed in this encounter.   CMA served as Education administrator during this visit. History, Physical and Plan performed by medical provider. Documentation and orders reviewed and attested to.  Penni Homans, MD

## 2016-12-16 NOTE — Patient Instructions (Addendum)
24 hours of clear fluids then progress to BRAT (bananas, rice, applesauce and toast) then progress as able. Call if worsens  Shingrix 2 shots over 6 months, most insurance covers, can get at the pharmacy  Food Choices to Help Relieve Diarrhea, Adult When you have diarrhea, the foods you eat and your eating habits are very important. Choosing the right foods and drinks can help:  Relieve diarrhea.  Replace lost fluids and nutrients.  Prevent dehydration.  What general guidelines should I follow? Relieving diarrhea  Choose foods with less than 2 g or .07 oz. of fiber per serving.  Limit fats to less than 8 tsp (38 g or 1.34 oz.) a day.  Avoid the following: ? Foods and beverages sweetened with high-fructose corn syrup, honey, or sugar alcohols such as xylitol, sorbitol, and mannitol. ? Foods that contain a lot of fat or sugar. ? Fried, greasy, or spicy foods. ? High-fiber grains, breads, and cereals. ? Raw fruits and vegetables.  Eat foods that are rich in probiotics. These foods include dairy products such as yogurt and fermented milk products. They help increase healthy bacteria in the stomach and intestines (gastrointestinal tract, or GI tract).  If you have lactose intolerance, avoid dairy products. These may make your diarrhea worse.  Take medicine to help stop diarrhea (antidiarrheal medicine) only as told by your health care provider. Replacing nutrients  Eat small meals or snacks every 3-4 hours.  Eat bland foods, such as white rice, toast, or baked potato, until your diarrhea starts to get better. Gradually reintroduce nutrient-rich foods as tolerated or as told by your health care provider. This includes: ? Well-cooked protein foods. ? Peeled, seeded, and soft-cooked fruits and vegetables. ? Low-fat dairy products.  Take vitamin and mineral supplements as told by your health care provider. Preventing dehydration   Start by sipping water or a special solution to  prevent dehydration (oral rehydration solution, ORS). Urine that is clear or pale yellow means that you are getting enough fluid.  Try to drink at least 8-10 cups of fluid each day to help replace lost fluids.  You may add other liquids in addition to water, such as clear juice or decaffeinated sports drinks, as tolerated or as told by your health care provider.  Avoid drinks with caffeine, such as coffee, tea, or soft drinks.  Avoid alcohol. What foods are recommended? The items listed may not be a complete list. Talk with your health care provider about what dietary choices are best for you. Grains White rice. White, [place], or pita breads (fresh or toasted), including plain rolls, buns, or bagels. White pasta. Saltine, soda, or graham crackers. Pretzels. Low-fiber cereal. Cooked cereals made with water (such as cornmeal, farina, or cream cereals). Plain muffins. Matzo. Melba toast. Zwieback. Vegetables Potatoes (without the skin). Most well-cooked and canned vegetables without skins or seeds. Tender lettuce. Fruits Apple sauce. Fruits canned in juice. Cooked apricots, cherries, grapefruit, peaches, pears, or plums. Fresh bananas and cantaloupe. Meats and other protein foods Baked or boiled chicken. Eggs. Tofu. Fish. Seafood. Smooth nut butters. Ground or well-cooked tender beef, ham, veal, lamb, pork, or poultry. Dairy Plain yogurt, kefir, and unsweetened liquid yogurt. Lactose-free milk, buttermilk, skim milk, or soy milk. Low-fat or nonfat hard cheese. Beverages Water. Low-calorie sports drinks. Fruit juices without pulp. Strained tomato and vegetable juices. Decaffeinated teas. Sugar-free beverages not sweetened with sugar alcohols. Oral rehydration solutions, if approved by your health care provider. Seasoning and other foods Bouillon, broth, or soups  made from recommended foods. What foods are not recommended? The items listed may not be a complete list. Talk with your health care  provider about what dietary choices are best for you. Grains Whole grain, whole wheat, bran, or rye breads, rolls, pastas, and crackers. Wild or brown rice. Whole grain or bran cereals. Barley. Oats and oatmeal. Corn tortillas or taco shells. Granola. Popcorn. Vegetables Raw vegetables. Fried vegetables. Cabbage, broccoli, Brussels sprouts, artichokes, baked beans, beet greens, corn, kale, legumes, peas, sweet potatoes, and yams. Potato skins. Cooked spinach and cabbage. Fruits Dried fruit, including raisins and dates. Raw fruits. Stewed or dried prunes. Canned fruits with syrup. Meat and other protein foods Fried or fatty meats. Deli meats. Chunky nut butters. Nuts and seeds. Beans and lentils. Berniece Salines. Hot dogs. Sausage. Dairy High-fat cheeses. Whole milk, chocolate milk, and beverages made with milk, such as milk shakes. Half-and-half. Cream. sour cream. Ice cream. Beverages Caffeinated beverages (such as coffee, tea, soda, or energy drinks). Alcoholic beverages. Fruit juices with pulp. Prune juice. Soft drinks sweetened with high-fructose corn syrup or sugar alcohols. High-calorie sports drinks. Fats and oils Butter. Cream sauces. Margarine. Salad oils. Plain salad dressings. Olives. Avocados. Mayonnaise. Sweets and desserts Sweet rolls, doughnuts, and sweet breads. Sugar-free desserts sweetened with sugar alcohols such as xylitol and sorbitol. Seasoning and other foods Honey. Hot sauce. Chili powder. Gravy. Cream-based or milk-based soups. Pancakes and waffles. Summary  When you have diarrhea, the foods you eat and your eating habits are very important.  Make sure you get at least 8-10 cups of fluid each day, or enough to keep your urine clear or pale yellow.  Eat bland foods and gradually reintroduce healthy, nutrient-rich foods as tolerated, or as told by your health care provider.  Avoid high-fiber, fried, greasy, or spicy foods. This information is not intended to replace advice  given to you by your health care provider. Make sure you discuss any questions you have with your health care provider. Document Released: 08/14/2003 Document Revised: 05/21/2016 Document Reviewed: 05/21/2016 Elsevier Interactive Patient Education  2017 [employer].

## 2016-12-16 NOTE — Assessment & Plan Note (Signed)
Check cmp today 

## 2016-12-16 NOTE — Assessment & Plan Note (Signed)
He thinks he got it from his grandson and wife who had it recently., he has had symptoms for past week or so. Encouraged clear liquids x 24  Hours then progress to [employer], call if worsens

## 2016-12-16 NOTE — Assessment & Plan Note (Signed)
Encouraged DASH diet, decrease po intake and increase exercise as tolerated. Needs 7-8 hours of sleep nightly. Avoid trans fats, eat small, frequent meals every 4-5 hours with lean proteins, complex carbs and healthy fats. Minimize simple carbs, consider bariatric referral 

## 2016-12-19 NOTE — Assessment & Plan Note (Signed)
Has lesions on both shins he believes started iwht insect bies and now become pruritic at intervals, has been seen by dermatology and no concerning diagnosis was given. Is given a prescription for Clobetasol to use prn

## 2016-12-20 ENCOUNTER — Ambulatory Visit (INDEPENDENT_AMBULATORY_CARE_PROVIDER_SITE_OTHER): Payer: Medicare Other | Admitting: Pharmacist Clinician (PhC)/ Clinical Pharmacy Specialist

## 2016-12-20 DIAGNOSIS — I480 Paroxysmal atrial fibrillation: Secondary | ICD-10-CM | POA: Diagnosis not present

## 2016-12-20 DIAGNOSIS — Z7901 Long term (current) use of anticoagulants: Secondary | ICD-10-CM

## 2016-12-20 DIAGNOSIS — I4891 Unspecified atrial fibrillation: Secondary | ICD-10-CM | POA: Diagnosis not present

## 2016-12-20 LAB — POCT INR: INR: 2.6

## 2016-12-26 ENCOUNTER — Other Ambulatory Visit: Payer: Self-pay | Admitting: Cardiovascular Disease

## 2016-12-27 NOTE — Telephone Encounter (Signed)
REFILL 

## 2017-01-12 ENCOUNTER — Other Ambulatory Visit: Payer: Self-pay | Admitting: Family Medicine

## 2017-01-12 DIAGNOSIS — M25561 Pain in right knee: Secondary | ICD-10-CM

## 2017-01-12 DIAGNOSIS — E1169 Type 2 diabetes mellitus with other specified complication: Secondary | ICD-10-CM

## 2017-01-12 DIAGNOSIS — I10 Essential (primary) hypertension: Secondary | ICD-10-CM

## 2017-01-12 DIAGNOSIS — M25562 Pain in left knee: Secondary | ICD-10-CM

## 2017-01-12 DIAGNOSIS — H409 Unspecified glaucoma: Secondary | ICD-10-CM

## 2017-01-12 DIAGNOSIS — G4700 Insomnia, unspecified: Secondary | ICD-10-CM

## 2017-01-12 DIAGNOSIS — E785 Hyperlipidemia, unspecified: Secondary | ICD-10-CM

## 2017-01-12 DIAGNOSIS — D649 Anemia, unspecified: Secondary | ICD-10-CM

## 2017-01-12 DIAGNOSIS — E669 Obesity, unspecified: Secondary | ICD-10-CM

## 2017-01-12 DIAGNOSIS — F411 Generalized anxiety disorder: Secondary | ICD-10-CM

## 2017-01-12 DIAGNOSIS — Z7901 Long term (current) use of anticoagulants: Secondary | ICD-10-CM

## 2017-01-12 DIAGNOSIS — E875 Hyperkalemia: Secondary | ICD-10-CM

## 2017-01-13 DIAGNOSIS — R972 Elevated prostate specific antigen [PSA]: Secondary | ICD-10-CM | POA: Diagnosis not present

## 2017-01-14 ENCOUNTER — Other Ambulatory Visit: Payer: Self-pay | Admitting: Family Medicine

## 2017-01-14 NOTE — Telephone Encounter (Signed)
Requesting:  temezapam Contract    12/16/2016 UDS    Low risk next is due on 06/18/2017 Last OV    12/16/2016---future appt is on 03/18/2017 Last Refill    #30 with 3 refills on 08/12/2016  Please Advise

## 2017-01-17 NOTE — Telephone Encounter (Signed)
rx faxed to [facility].  

## 2017-01-18 DIAGNOSIS — N5201 Erectile dysfunction due to arterial insufficiency: Secondary | ICD-10-CM | POA: Diagnosis not present

## 2017-01-18 DIAGNOSIS — E291 Testicular hypofunction: Secondary | ICD-10-CM | POA: Diagnosis not present

## 2017-01-18 DIAGNOSIS — R3912 Poor urinary stream: Secondary | ICD-10-CM | POA: Diagnosis not present

## 2017-01-18 DIAGNOSIS — R972 Elevated prostate specific antigen [PSA]: Secondary | ICD-10-CM | POA: Diagnosis not present

## 2017-01-18 DIAGNOSIS — N401 Enlarged prostate with lower urinary tract symptoms: Secondary | ICD-10-CM | POA: Diagnosis not present

## 2017-01-31 ENCOUNTER — Ambulatory Visit (INDEPENDENT_AMBULATORY_CARE_PROVIDER_SITE_OTHER): Payer: Medicare Other | Admitting: Pharmacist Clinician (PhC)/ Clinical Pharmacy Specialist

## 2017-01-31 DIAGNOSIS — Z7901 Long term (current) use of anticoagulants: Secondary | ICD-10-CM | POA: Diagnosis not present

## 2017-01-31 DIAGNOSIS — I4891 Unspecified atrial fibrillation: Secondary | ICD-10-CM | POA: Diagnosis not present

## 2017-01-31 DIAGNOSIS — I480 Paroxysmal atrial fibrillation: Secondary | ICD-10-CM

## 2017-01-31 LAB — POCT INR: INR: 2.5

## 2017-02-14 ENCOUNTER — Other Ambulatory Visit: Payer: Self-pay | Admitting: Family Medicine

## 2017-02-14 ENCOUNTER — Other Ambulatory Visit: Payer: Self-pay

## 2017-02-14 ENCOUNTER — Other Ambulatory Visit: Payer: Self-pay | Admitting: Cardiovascular Disease

## 2017-02-14 MED ORDER — METFORMIN HCL 500 MG PO TABS
500.0000 mg | ORAL_TABLET | Freq: Three times a day (TID) | ORAL | 0 refills | Status: DC
Start: 2017-02-14 — End: 2017-05-13

## 2017-02-22 ENCOUNTER — Telehealth: Payer: Self-pay | Admitting: *Deleted

## 2017-02-22 NOTE — Telephone Encounter (Signed)
Received Cologuard Order Cancellation: [id number]; order has been Cancelled because it has been Inactive Order Status and has exceeded 365 days of the Initial order; forwarded to provider/SLS 09/18

## 2017-03-14 ENCOUNTER — Ambulatory Visit (INDEPENDENT_AMBULATORY_CARE_PROVIDER_SITE_OTHER): Payer: Medicare Other | Admitting: Pharmacist Clinician (PhC)/ Clinical Pharmacy Specialist

## 2017-03-14 ENCOUNTER — Other Ambulatory Visit (INDEPENDENT_AMBULATORY_CARE_PROVIDER_SITE_OTHER): Payer: Medicare Other

## 2017-03-14 DIAGNOSIS — E1169 Type 2 diabetes mellitus with other specified complication: Secondary | ICD-10-CM

## 2017-03-14 DIAGNOSIS — I10 Essential (primary) hypertension: Secondary | ICD-10-CM | POA: Diagnosis not present

## 2017-03-14 DIAGNOSIS — R790 Abnormal level of blood mineral: Secondary | ICD-10-CM | POA: Diagnosis not present

## 2017-03-14 DIAGNOSIS — I4891 Unspecified atrial fibrillation: Secondary | ICD-10-CM | POA: Diagnosis not present

## 2017-03-14 DIAGNOSIS — E782 Mixed hyperlipidemia: Secondary | ICD-10-CM | POA: Diagnosis not present

## 2017-03-14 DIAGNOSIS — E669 Obesity, unspecified: Secondary | ICD-10-CM | POA: Diagnosis not present

## 2017-03-14 DIAGNOSIS — Z7901 Long term (current) use of anticoagulants: Secondary | ICD-10-CM | POA: Diagnosis not present

## 2017-03-14 DIAGNOSIS — I480 Paroxysmal atrial fibrillation: Secondary | ICD-10-CM

## 2017-03-14 LAB — MAGNESIUM: MAGNESIUM: 2 mg/dL (ref 1.5–2.5)

## 2017-03-14 LAB — COMPREHENSIVE METABOLIC PANEL
ALBUMIN: 4.2 g/dL (ref 3.5–5.2)
ALK PHOS: 38 U/L — AB (ref 39–117)
ALT: 11 U/L (ref 0–53)
AST: 12 U/L (ref 0–37)
BILIRUBIN TOTAL: 0.6 mg/dL (ref 0.2–1.2)
BUN: 29 mg/dL — ABNORMAL HIGH (ref 6–23)
CALCIUM: 10.1 mg/dL (ref 8.4–10.5)
CO2: 27 meq/L (ref 19–32)
CREATININE: 1.86 mg/dL — AB (ref 0.40–1.50)
Chloride: 108 mEq/L (ref 96–112)
GFR: 47.25 mL/min — ABNORMAL LOW (ref >60.00)
GLUCOSE: 111 mg/dL — AB (ref 70–99)
Potassium: 5.6 mEq/L — ABNORMAL HIGH (ref 3.5–5.1)
Sodium: 143 mEq/L (ref 135–145)
Total Protein: 7.2 g/dL (ref 6.0–8.3)

## 2017-03-14 LAB — LIPID PANEL
CHOL/HDL RATIO: 4
Cholesterol: 134 mg/dL (ref 0–200)
HDL: 31.6 mg/dL — ABNORMAL LOW (ref >39.00)
LDL Cholesterol: 71 mg/dL (ref 0–99)
NONHDL: 102.47
Triglycerides: 158 mg/dL — ABNORMAL HIGH (ref 0.0–149.0)
VLDL: 31.6 mg/dL (ref 0.0–40.0)

## 2017-03-14 LAB — CBC
HCT: 48 % (ref 39.0–52.0)
HEMOGLOBIN: 15.3 g/dL (ref 13.0–17.0)
MCHC: 32 g/dL (ref 30.0–36.0)
MCV: 83 fl (ref 78.0–100.0)
PLATELETS: 207 10*3/uL (ref 150.0–400.0)
RBC: 5.78 Mil/uL (ref 4.22–5.81)
RDW: 16.6 % — ABNORMAL HIGH (ref 11.5–15.5)
WBC: 3.9 10*3/uL — ABNORMAL LOW (ref 4.0–10.5)

## 2017-03-14 LAB — POCT INR: INR: 2.5

## 2017-03-14 LAB — TSH: TSH: 1.89 u[IU]/mL (ref 0.35–4.50)

## 2017-03-17 ENCOUNTER — Other Ambulatory Visit: Payer: Self-pay

## 2017-03-17 MED ORDER — DOFETILIDE 500 MCG PO CAPS: 500.0000 ug | ORAL_CAPSULE | Freq: Two times a day (BID) | ORAL | 3 refills | Status: DC

## 2017-03-18 ENCOUNTER — Ambulatory Visit (INDEPENDENT_AMBULATORY_CARE_PROVIDER_SITE_OTHER): Payer: Medicare Other | Admitting: Family Medicine

## 2017-03-18 ENCOUNTER — Encounter: Payer: Self-pay | Admitting: Family Medicine

## 2017-03-18 VITALS — BP 122/82 | HR 89 | Temp 97.9°F | Resp 18 | Wt 221.2 lb

## 2017-03-18 DIAGNOSIS — G4700 Insomnia, unspecified: Secondary | ICD-10-CM | POA: Diagnosis not present

## 2017-03-18 DIAGNOSIS — E669 Obesity, unspecified: Secondary | ICD-10-CM | POA: Diagnosis not present

## 2017-03-18 DIAGNOSIS — I5032 Chronic diastolic (congestive) heart failure: Secondary | ICD-10-CM | POA: Diagnosis not present

## 2017-03-18 DIAGNOSIS — I480 Paroxysmal atrial fibrillation: Secondary | ICD-10-CM | POA: Diagnosis not present

## 2017-03-18 DIAGNOSIS — N400 Enlarged prostate without lower urinary tract symptoms: Secondary | ICD-10-CM

## 2017-03-18 DIAGNOSIS — E782 Mixed hyperlipidemia: Secondary | ICD-10-CM

## 2017-03-18 DIAGNOSIS — E875 Hyperkalemia: Secondary | ICD-10-CM | POA: Diagnosis not present

## 2017-03-18 DIAGNOSIS — Z23 Encounter for immunization: Secondary | ICD-10-CM

## 2017-03-18 DIAGNOSIS — I10 Essential (primary) hypertension: Secondary | ICD-10-CM

## 2017-03-18 DIAGNOSIS — E1169 Type 2 diabetes mellitus with other specified complication: Secondary | ICD-10-CM | POA: Diagnosis not present

## 2017-03-18 LAB — COMPREHENSIVE METABOLIC PANEL
ALBUMIN: 4.3 g/dL (ref 3.5–5.2)
ALK PHOS: 33 U/L — AB (ref 39–117)
ALT: 12 U/L (ref 0–53)
AST: 14 U/L (ref 0–37)
BILIRUBIN TOTAL: 0.6 mg/dL (ref 0.2–1.2)
BUN: 37 mg/dL — ABNORMAL HIGH (ref 6–23)
CALCIUM: 9.6 mg/dL (ref 8.4–10.5)
CO2: 28 mEq/L (ref 19–32)
CREATININE: 1.7 mg/dL — AB (ref 0.40–1.50)
Chloride: 103 mEq/L (ref 96–112)
GFR: 52.41 mL/min — AB (ref >60.00)
Glucose, Bld: 118 mg/dL — ABNORMAL HIGH (ref 70–99)
Potassium: 4.5 mEq/L (ref 3.5–5.1)
Sodium: 140 mEq/L (ref 135–145)
TOTAL PROTEIN: 7.3 g/dL (ref 6.0–8.3)

## 2017-03-18 LAB — HEMOGLOBIN A1C: Hgb A1c MFr Bld: 7.2 % — ABNORMAL HIGH (ref 4.6–6.5)

## 2017-03-18 NOTE — Assessment & Plan Note (Signed)
Follows with Dr Roni Bread at [facility] urology. His PSA is above 8 at present. No new urinary symptoms

## 2017-03-18 NOTE — Assessment & Plan Note (Signed)
Tolerating statin, encouraged heart healthy diet, avoid trans fats, minimize simple carbs and saturated fats. Increase exercise as tolerated 

## 2017-03-18 NOTE — Assessment & Plan Note (Signed)
Did not sleep last night secondary to no power and not being able use his CPAP and so he did not take his meds.

## 2017-03-18 NOTE — Assessment & Plan Note (Signed)
Follows with cardiology and has mostly been stable.

## 2017-03-18 NOTE — Progress Notes (Signed)
Subjective:  I acted as a Education administrator for Dr. Charlett Blake. [place], [place]  Patient ID: [name], male    DOB: 1950/07/15, 66 y.o.   MRN: [id number]  No chief complaint on file.   HPI  Patient is in today for 3 month follow up on chronic medical concern.s continues. He had no sleep last night without power and thus no cpap and sleep meds. No recent hospitalization, he had a 2 to 3 days history of palpitations recently, he had some SOB with it and fatigue. Denies CP/palp/SOB/HA/congestion/GI or GU c/o. Taking meds as prescribed. Describes a 24 hour period of fevers, chills, malaise but it passed without incident.  Patient Care Team: [name], MD as PCP - General Croitoru, Dani Gobble, MD (Cardiology) Renda Rolls, Jennefer Bravo, MD as Consulting Physician (Dermatology) Rigoberto Noel, MD as Consulting Physician (Pulmonary Disease) Irine Seal, MD as Consulting Physician (Urology)   Past Medical History:  Diagnosis Date  . AORTIC STENOSIS, MODERATE 12/05/2009  . Atrial fibrillation ([facility])   . Atrial flutter ([place]) 12/05/2009  . Atypical chest pain 11/08/2011  . BENIGN PROSTATIC HYPERTROPHY, HX OF 12/05/2009  . CHEST PAIN-UNSPECIFIED 11/11/2009  . CHF 12/05/2009  . Chronic renal insufficiency 12/16/2016  . Debility 04/18/2012  . Diarrhea 12/16/2016  . DM 12/05/2009  . DYSPNEA 12/19/2009  . Edema 04/18/2012  . ERECTILE DYSFUNCTION 01/25/2007  . FATIGUE, CHRONIC 11/11/2009  . Gout 07/10/2012  . Heart murmur   . Hyperkalemia 04/18/2012  . Hyperlipidemia   . Hypertension   . HYPERTENSION 01/25/2007  . Hypoxia 05/05/2011  . Insomnia 05/13/2016  . INSOMNIA, HX OF 01/25/2007  . Knee pain, bilateral 12/28/2010  . Knee pain, right 12/28/2010  . NEUROMA 01/05/2010  . OBESITY NOS 01/25/2007  . OSA (obstructive sleep apnea) 07/13/2014  . PERIPHERAL NEUROPATHY 01/05/2010  . PULMONARY FUNCTION TESTS, ABNORMAL 02/02/2010  . SLEEP APNEA 04/06/2010   uses cpap  . Superficial thrombophlebitis of left leg 09/07/2011  . TESTICULAR  HYPOFUNCTION 01/05/2010  . 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 Toe pain, right 07/10/2012  . UNSPECIFIED ANEMIA 01/05/2010    Past Surgical History:  Procedure Laterality Date  . bilat foot surgery removed part of 5th metatarsal to corect curvature of toes    . CARDIAC CATHETERIZATION  10/16/2009   nonischemic cardiomyopathy  . CARDIOVERSION N/A 03/25/2015   Procedure: CARDIOVERSION;  Surgeon: Pixie Casino, MD;  Location: [facility] ENDOSCOPY;  Service: Cardiovascular;  Laterality: N/A;    Family History  Problem Relation Age of Onset  . Clotting disorder Mother   . Heart disease Mother        s/p MI  . Heart attack Mother   . Hypertension Mother   . Diabetes Mother   . Hyperlipidemia Mother   . Stroke Mother   . Cancer Father        ? lung  . Lung disease Father        smoker  . Diabetes Sister   . Hypertension Sister        smoker  . Leukemia Maternal Grandmother        ?  [name] Aneurysm Sister        brain  . Other Sister        clipped  . Seizures Sister        d/o w/aneurysm/ smoker    Social History   Social History  . Marital status: Married    Spouse name: N/A  . Number of children: N/A  . Years  of education: N/A   Occupational History  . Not on file.   Social History Main Topics  . Smoking status: Former Smoker    Packs/day: 1.00    Years: 10.00    Quit date: 06/07/1980  . Smokeless tobacco: Never Used  . Alcohol use No  . Drug use: No  . Sexual activity: Not Currently   Other Topics Concern  . Not on file   Social History Narrative  . No narrative on file    Outpatient Medications Prior to Visit  Medication Sig Dispense Refill  . albuterol (VENTOLIN HFA) 108 (90 Base) MCG/ACT inhaler Inhale 2 puffs into the lungs every 6 (six) hours as needed for wheezing or shortness of breath. 1 Inhaler 6  . allopurinol (ZYLOPRIM) 300 MG tablet TAKE ONE TABLET BY MOUTH ONCE DAILY 90 tablet 2  . aspirin EC 81 MG tablet Take 81 mg by mouth daily.    . benazepril (LOTENSIN)  40 MG tablet Take 1 tablet (40 mg total) by mouth daily. 90 tablet 3  . calcium carbonate (TUMS - DOSED IN MG ELEMENTAL CALCIUM) 500 MG chewable tablet Chew 1 tablet by mouth daily as needed. For heartburn    . cetirizine (ZYRTEC) 10 MG tablet Take 1 tablet (10 mg total) by mouth daily. 30 tablet 11  . Clobetasol Prop Emollient Base (CLOBETASOL PROPIONATE E) 0.05 % emollient cream Apply 1 application topically 2 (two) times daily as needed (insect bite). 60 g 1  . colchicine (COLCRYS) 0.6 MG tablet TAKE ONE TABLET BY MOUTH DAILY AS NEEDED FOR GOUT FLARE UPS. 30 tablet 0  . dofetilide (TIKOSYN) 500 MCG capsule Take 1 capsule (500 mcg total) by mouth 2 (two) times daily. 180 capsule 3  . fish oil-omega-3 fatty acids 1000 MG capsule Take 1 g by mouth daily.     . furosemide (LASIX) 40 MG tablet Take 2 tablets (80 mg total) by mouth every morning and take 1 tablet (40 mg total) by mouth every afternoon. 270 tablet 3  . glyBURIDE (DIABETA) 5 MG tablet TAKE 1 TABLET BY MOUTH ONCE DAILY WITH BREAKFAST 90 tablet 0  . glyBURIDE (DIABETA) 5 MG tablet TAKE 1 TABLET BY MOUTH TWICE DAILY 180 tablet 1  . HYDROcodone-acetaminophen (NORCO) 10-325 MG tablet Take 1 tablet by mouth every 8 (eight) hours as needed for moderate pain or severe pain. 90 tablet 0  . KLOR-CON M20 20 MEQ tablet TAKE ONE TABLET BY MOUTH ONCE DAILY AND  EXTRA  TABLET  IF  WEIGHT  IS  ABOVE  210  LBS 180 tablet 2  . LORazepam (ATIVAN) 1 MG tablet Take 1 tablet (1 mg total) by mouth every 8 (eight) hours as needed for anxiety. 30 tablet 3  . magnesium oxide (MAG-OX) 400 MG tablet Take 1 tablet (400 mg total) by mouth daily. 30 tablet 11  . metFORMIN (GLUCOPHAGE) 500 MG tablet Take 1 tablet (500 mg total) by mouth 3 (three) times daily. 270 tablet 0  . metoprolol (LOPRESSOR) 100 MG tablet TAKE ONE TABLET BY MOUTH TWICE DAILY 180 tablet 3  . Multiple Vitamins-Minerals (MULTIVITAMIN PO) Take 1 tablet by mouth daily.    . mupirocin ointment  (BACTROBAN) 2 % Place 1 application into the nose 2 (two) times daily. 30 g 1  . naproxen (NAPROSYN) 375 MG tablet Take 1 tablet (375 mg total) by mouth daily. 30 tablet 4  . pravastatin (PRAVACHOL) 40 MG tablet TAKE ONE TABLET BY MOUTH ONCE DAILY 90 tablet 3  .  temazepam (RESTORIL) 30 MG capsule TAKE 1 CAPSULE BY MOUTH AT BEDTIME AS NEEDED FOR SLEEP 30 capsule 3  . Testosterone 10 MG/ACT (2%) GEL Apply 1 application topically daily. Use as directed    . warfarin (COUMADIN) 4 MG tablet TAKE 1/2 TO 1 (ONE-HALF TO ONE) TABLET BY MOUTH DAILY OR AS DIRECTED BY COUMADIN CLINIC 90 tablet 1   No facility-administered medications prior to visit.     Allergies  Allergen Reactions  . Sulfonamide Derivatives Other (See Comments)    unknown reaction.  Patient states had to go to the ER.   [name] Zolpidem Other (See Comments)    Excessive, prolonged sedation    Review of Systems  Constitutional: Negative for fever and malaise/fatigue.  HENT: Negative for congestion.   Eyes: Negative for blurred vision and photophobia.  Respiratory: Positive for shortness of breath. Negative for cough.   Cardiovascular: Positive for palpitations. Negative for chest pain and leg swelling.  Gastrointestinal: Negative for vomiting.  Musculoskeletal: Negative for back pain.  Skin: Negative for rash.  Neurological: Negative for loss of consciousness and headaches.  Psychiatric/Behavioral: Positive for depression. The patient has insomnia.        Objective:    Physical Exam  Constitutional: He is oriented to person, place, and time. He appears well-developed and well-nourished. No distress.  HENT:  Head: Normocephalic and atraumatic.  Nose: Nose normal.  Eyes: Conjunctivae are normal. Right eye exhibits no discharge. Left eye exhibits no discharge.  Neck: Normal range of motion. Neck supple. No thyromegaly present.  Cardiovascular: Normal rate.   No murmur heard. Irregularly irregular  Pulmonary/Chest: Effort  normal and breath sounds normal. He has no wheezes.  Abdominal: Soft. Bowel sounds are normal. There is no tenderness.  Musculoskeletal: Normal range of motion. He exhibits no edema or deformity.  Neurological: He is alert and oriented to person, place, and time.  Skin: Skin is warm and dry. He is not diaphoretic.  Psychiatric: He has a normal mood and affect.  Nursing note and vitals reviewed.   BP 122/82 (BP Location: Left Arm, Patient Position: Sitting, Cuff Size: Normal)   Pulse 89   Temp 97.9 F (36.6 C) (Oral)   Resp 18   Wt 221 lb 3.2 oz (100.3 kg)   SpO2 95%   BMI 30.85 kg/m  Wt Readings from Last 3 Encounters:  03/18/17 221 lb 3.2 oz (100.3 kg)  11/08/16 225 lb (102.1 kg)  08/12/16 226 lb 9.6 oz (102.8 kg)   BP Readings from Last 3 Encounters:  03/18/17 122/82  11/08/16 116/72  08/12/16 114/60     Immunization History  Administered Date(s) Administered  . Influenza Split 03/03/2011, 03/10/2012  . Influenza Whole 03/28/2008, 03/30/2010  . Influenza,inj,Quad PF,6+ Mos 01/31/2013, 03/07/2014, 04/14/2015, 02/19/2016  . Pneumococcal Polysaccharide-23 10/07/2009  . Td 09/05/2009    Health Maintenance  Topic Date Due  . OPHTHALMOLOGY EXAM  01/07/1963  . COLONOSCOPY  01/07/2003  . INFLUENZA VACCINE  01/05/2017  . HEMOGLOBIN A1C  05/17/2017  . FOOT EXAM  08/12/2017  . TETANUS/TDAP  09/06/2019  . Hepatitis C Screening  Addressed  . HIV Screening  Addressed    Lab Results  Component Value Date   WBC 3.9 (L) 03/14/2017   HGB 15.3 03/14/2017   HCT 48.0 03/14/2017   PLT 207.0 03/14/2017   GLUCOSE 111 (H) 03/14/2017   CHOL 134 03/14/2017   TRIG 158.0 (H) 03/14/2017   HDL 31.60 (L) 03/14/2017   LDLCALC 71 03/14/2017   ALT  11 03/14/2017   AST 12 03/14/2017   NA 143 03/14/2017   K 5.6 (H) 03/14/2017   CL 108 03/14/2017   CREATININE 1.86 (H) 03/14/2017   BUN 29 (H) 03/14/2017   CO2 27 03/14/2017   TSH 1.89 03/14/2017   PSA 4.26 (H) 12/29/2009   INR 2.5  03/14/2017   HGBA1C 7.0 (H) 11/15/2016   MICROALBUR 1.1 04/14/2015    Lab Results  Component Value Date   TSH 1.89 03/14/2017   Lab Results  Component Value Date   WBC 3.9 (L) 03/14/2017   HGB 15.3 03/14/2017   HCT 48.0 03/14/2017   MCV 83.0 03/14/2017   PLT 207.0 03/14/2017   Lab Results  Component Value Date   NA 143 03/14/2017   K 5.6 (H) 03/14/2017   CO2 27 03/14/2017   GLUCOSE 111 (H) 03/14/2017   BUN 29 (H) 03/14/2017   CREATININE 1.86 (H) 03/14/2017   BILITOT 0.6 03/14/2017   ALKPHOS 38 (L) 03/14/2017   AST 12 03/14/2017   ALT 11 03/14/2017   PROT 7.2 03/14/2017   ALBUMIN 4.2 03/14/2017   CALCIUM 10.1 03/14/2017   GFR 47.25 (L) 03/14/2017   Lab Results  Component Value Date   CHOL 134 03/14/2017   Lab Results  Component Value Date   HDL 31.60 (L) 03/14/2017   Lab Results  Component Value Date   LDLCALC 71 03/14/2017   Lab Results  Component Value Date   TRIG 158.0 (H) 03/14/2017   Lab Results  Component Value Date   CHOLHDL 4 03/14/2017   Lab Results  Component Value Date   HGBA1C 7.0 (H) 11/15/2016         Assessment & Plan:   Problem List Items Addressed This Visit    Diabetes mellitus type 2 in obese ([place])    hgba1c acceptable, minimize simple carbs. Increase exercise as tolerated. Continue current meds      Chronic diastolic congestive heart failure ([place])    Follows with cardiology and has mostly been stable.      BPH (benign prostatic hyperplasia)    Follows with Dr Roni Bread at [facility] urology. His PSA is above 8 at present. No new urinary symptoms      Hyperlipidemia    Tolerating statin, encouraged heart healthy diet, avoid trans fats, minimize simple carbs and saturated fats. Increase exercise as tolerated      Paroxysmal atrial fibrillation ([facility])    Had an episode of irregular rhythm flaring that lasted several days with sob. It has resolved resolved now and he did not go anywhere to get looked at      Hyperkalemia     Recent 5.6 he stoped his supplements. He was having some palpitations but since stopping it it resolved. He did not go anywhere and he feels well today. He is advised if it happens again.      Essential hypertension, malignant    Well controlled, no changes to meds. Encouraged heart healthy diet such as the DASH diet and exercise as tolerated.       Insomnia    Did not sleep last night secondary to no power and not being able use his CPAP and so he did not take his meds.          I am having Mr. Turgeon maintain his fish oil-omega-3 fatty acids, calcium carbonate, aspirin EC, Multiple Vitamins-Minerals (MULTIVITAMIN PO), Testosterone, mupirocin ointment, cetirizine, magnesium oxide, colchicine, furosemide, naproxen, LORazepam, pravastatin, metoprolol tartrate, glyBURIDE, albuterol, Clobetasol Prop Emollient Base,  HYDROcodone-acetaminophen, benazepril, KLOR-CON M20, allopurinol, temazepam, glyBURIDE, warfarin, metFORMIN, and dofetilide.  No orders of the defined types were placed in this encounter.   CMA served as Education administrator during this visit. History, Physical and Plan performed by medical provider. Documentation and orders reviewed and attested to.  Penni Homans, MD

## 2017-03-18 NOTE — Assessment & Plan Note (Signed)
hgba1c acceptable, minimize simple carbs. Increase exercise as tolerated. Continue current meds 

## 2017-03-18 NOTE — Patient Instructions (Signed)

## 2017-03-18 NOTE — Addendum Note (Signed)
Addended by: Magdalene Molly A on: 03/18/2017 11:49 AM   Modules accepted: Orders

## 2017-03-18 NOTE — Assessment & Plan Note (Signed)
Had an episode of irregular rhythm flaring that lasted several days with sob. It has resolved resolved now and he did not go anywhere to get looked at

## 2017-03-18 NOTE — Assessment & Plan Note (Signed)
Recent 5.6 he stoped his supplements. He was having some palpitations but since stopping it it resolved. He did not go anywhere and he feels well today. He is advised if it happens again.

## 2017-03-18 NOTE — Assessment & Plan Note (Signed)
Well controlled, no changes to meds. Encouraged heart healthy diet such as the DASH diet and exercise as tolerated.  °

## 2017-04-06 ENCOUNTER — Other Ambulatory Visit: Payer: Self-pay | Admitting: *Deleted

## 2017-04-06 ENCOUNTER — Other Ambulatory Visit: Payer: Self-pay

## 2017-04-06 MED ORDER — DOFETILIDE 500 MCG PO CAPS: 500.0000 ug | ORAL_CAPSULE | Freq: Two times a day (BID) | ORAL | 1 refills | Status: DC

## 2017-04-20 DIAGNOSIS — R972 Elevated prostate specific antigen [PSA]: Secondary | ICD-10-CM | POA: Diagnosis not present

## 2017-04-25 ENCOUNTER — Ambulatory Visit (INDEPENDENT_AMBULATORY_CARE_PROVIDER_SITE_OTHER): Payer: Medicare Other | Admitting: Pharmacist Clinician (PhC)/ Clinical Pharmacy Specialist

## 2017-04-25 DIAGNOSIS — I4891 Unspecified atrial fibrillation: Secondary | ICD-10-CM | POA: Diagnosis not present

## 2017-04-25 DIAGNOSIS — I480 Paroxysmal atrial fibrillation: Secondary | ICD-10-CM

## 2017-04-25 DIAGNOSIS — Z7901 Long term (current) use of anticoagulants: Secondary | ICD-10-CM | POA: Diagnosis not present

## 2017-04-25 LAB — POCT INR: INR: 2.9

## 2017-05-13 ENCOUNTER — Other Ambulatory Visit: Payer: Self-pay | Admitting: Family Medicine

## 2017-05-19 ENCOUNTER — Other Ambulatory Visit: Payer: Self-pay | Admitting: Cardiovascular Disease

## 2017-06-14 ENCOUNTER — Telehealth: Payer: Self-pay | Admitting: Family Medicine

## 2017-06-14 ENCOUNTER — Ambulatory Visit (INDEPENDENT_AMBULATORY_CARE_PROVIDER_SITE_OTHER): Payer: Medicare Other | Admitting: Pharmacist Clinician (PhC)/ Clinical Pharmacy Specialist

## 2017-06-14 ENCOUNTER — Encounter: Payer: Self-pay | Admitting: Cardiovascular Disease

## 2017-06-14 ENCOUNTER — Other Ambulatory Visit: Payer: Self-pay

## 2017-06-14 ENCOUNTER — Ambulatory Visit (INDEPENDENT_AMBULATORY_CARE_PROVIDER_SITE_OTHER): Payer: Medicare Other | Admitting: Cardiovascular Disease

## 2017-06-14 VITALS — BP 108/78 | HR 131 | Ht 71.0 in | Wt 221.0 lb

## 2017-06-14 DIAGNOSIS — Z7901 Long term (current) use of anticoagulants: Secondary | ICD-10-CM

## 2017-06-14 DIAGNOSIS — I481 Persistent atrial fibrillation: Secondary | ICD-10-CM | POA: Diagnosis not present

## 2017-06-14 DIAGNOSIS — Z01812 Encounter for preprocedural laboratory examination: Secondary | ICD-10-CM | POA: Diagnosis not present

## 2017-06-14 DIAGNOSIS — E6609 Other obesity due to excess calories: Secondary | ICD-10-CM | POA: Diagnosis not present

## 2017-06-14 DIAGNOSIS — I5032 Chronic diastolic (congestive) heart failure: Secondary | ICD-10-CM

## 2017-06-14 DIAGNOSIS — Z6830 Body mass index (BMI) 30.0-30.9, adult: Secondary | ICD-10-CM | POA: Diagnosis not present

## 2017-06-14 DIAGNOSIS — Z5181 Encounter for therapeutic drug level monitoring: Secondary | ICD-10-CM | POA: Diagnosis not present

## 2017-06-14 DIAGNOSIS — I10 Essential (primary) hypertension: Secondary | ICD-10-CM | POA: Diagnosis not present

## 2017-06-14 DIAGNOSIS — I351 Nonrheumatic aortic (valve) insufficiency: Secondary | ICD-10-CM | POA: Diagnosis not present

## 2017-06-14 DIAGNOSIS — I4891 Unspecified atrial fibrillation: Secondary | ICD-10-CM | POA: Diagnosis not present

## 2017-06-14 DIAGNOSIS — Z79899 Other long term (current) drug therapy: Secondary | ICD-10-CM

## 2017-06-14 DIAGNOSIS — I4819 Other persistent atrial fibrillation: Secondary | ICD-10-CM

## 2017-06-14 DIAGNOSIS — I480 Paroxysmal atrial fibrillation: Secondary | ICD-10-CM

## 2017-06-14 DIAGNOSIS — G4733 Obstructive sleep apnea (adult) (pediatric): Secondary | ICD-10-CM | POA: Diagnosis not present

## 2017-06-14 DIAGNOSIS — I482 Chronic atrial fibrillation: Secondary | ICD-10-CM | POA: Diagnosis not present

## 2017-06-14 LAB — CBC
HEMATOCRIT: 41 % (ref 37.5–51.0)
Hemoglobin: 13.7 g/dL (ref 13.0–17.7)
MCH: 26.9 pg (ref 26.6–33.0)
MCHC: 33.4 g/dL (ref 31.5–35.7)
MCV: 80 fL (ref 79–97)
PLATELETS: 204 10*3/uL (ref 150–379)
RBC: 5.1 x10E6/uL (ref 4.14–5.80)
RDW: 16 % — AB (ref 12.3–15.4)
WBC: 4.2 10*3/uL (ref 3.4–10.8)

## 2017-06-14 LAB — BASIC METABOLIC PANEL
BUN / CREAT RATIO: 19 (ref 10–24)
BUN: 30 mg/dL — AB (ref 8–27)
CO2: 24 mmol/L (ref 20–29)
CREATININE: 1.57 mg/dL — AB (ref 0.76–1.27)
Calcium: 10.1 mg/dL (ref 8.6–10.2)
Chloride: 101 mmol/L (ref 96–106)
GFR, EST AFRICAN AMERICAN: 53 mL/min/{1.73_m2} — AB (ref >59)
GFR, EST NON AFRICAN AMERICAN: 46 mL/min/{1.73_m2} — AB (ref >59)
Glucose: 143 mg/dL — ABNORMAL HIGH (ref 65–99)
Potassium: 5.4 mmol/L — ABNORMAL HIGH (ref 3.5–5.2)
SODIUM: 141 mmol/L (ref 134–144)

## 2017-06-14 LAB — POCT INR: INR: 2.5

## 2017-06-14 NOTE — Telephone Encounter (Signed)
Copied from [place] [id number]. Topic: General - Other >> Jun 14, 2017  1:38 PM [name] wrote: Pt had to reschedule his appt w/ Dr. Charlett Blake due to a procedure with his cardiologist on 06/17/17.  The app was rescheduled to 07/05/17.  If pt could be worked in sooner, please call him to let him know.

## 2017-06-14 NOTE — Telephone Encounter (Signed)
Copied from [place] [phone]. >> Jun 14, 2017  1:38 PM Neva Seat wrote: Pt had to reschedule his appt w/ Dr. Charlett Blake due to a procedure with his cardiologist on 06/17/17.  The app was rescheduled to 07/05/17.  If pt could be worked in sooner, please call him to let him know.

## 2017-06-14 NOTE — Progress Notes (Signed)
Cardiology Office Note    Date:  06/14/2017   ID:  [name], [name] [birth year], MRN [id number]  PCP:  [name], MD  Cardiologist:   Sanda Klein, MD   Chief Complaint  Patient presents with  . Follow-up    6 months  . Shortness of Breath  . Fatigue    History of Present Illness:  [name] is a 67 y.o. male with aortic insufficiency, diastolic heart failure, paroxysmal atrial fibrillation and atrial flutter, obstructive sleep apnea, dyslipidemia in the setting of obesity and type 2 diabetes mellitus.  He presents today with approximately 2 weeks of worsening shortness of breath NYHA functional class II-III. He is not aware of any palpitations and has been unable to tell when he has atrial fibrillation for the last 2 or 3 years. His rhythm today is atrial fibrillation with rapid ventricular response at about 130 beats per minutes. She has been compliant with dofetilide (he is receiving a new type of generic dofetilide), metoprolol and warfarin. His INR is consistently therapeutic. He has not had any embolic or bleeding complications  The QT interval is difficult to measure today due to the irregular rhythm tachycardia and irregular baseline  The patient specifically denies any chest pain at rest or exertion, orthopnea, paroxysmal nocturnal dyspnea, syncope, palpitations, focal neurological deficits, intermittent claudication, lower extremity edema, unexplained weight gain, cough, hemoptysis or wheezing. Has been taking naproxen once daily for a sore tooth but has not had any GI complaints or bleeding.  Echocardiogram December 31, 2015:  normal left ventricular systolic function EF 08%, moderate LVH and moderate to severely dilated left atrium, suspicious for diastolic dysfunction and elevated filling pressures (although no comment was made on diastolic function on the report.). He has moderate aortic insufficiency due to dilated root (unchanged). The estimated systolic PA  pressure was around 60 mmHg.[name] He has a long-standing history of severe hypertension which has not always been well treated. He also has long-standing history of obstructive sleep apnea which was also not treated until recently, but he is now 100% CPAP-compliant. Coronary angiography 2011 did not show evidence of significant stenoses. He presented with typical atrial flutter in 2011 and has recurrent paroxysmal atrial fibrillation with a good response to treatment with dofetilide. Additional problems include obesity, type 2 diabetes mellitus, gout and androgen deficiency. He had a successful cardioversion on March 25 2015 for persistent atrial fibrillation.   Past Medical History:  Diagnosis Date  . AORTIC STENOSIS, MODERATE 12/05/2009  . Atrial fibrillation ([place])   . Atrial flutter ([place]) 12/05/2009  . Atypical chest pain 11/08/2011  . BENIGN PROSTATIC HYPERTROPHY, HX OF 12/05/2009  . CHEST PAIN-UNSPECIFIED 11/11/2009  . CHF 12/05/2009  . Chronic renal insufficiency 12/16/2016  . Debility 04/18/2012  . Diarrhea 12/16/2016  . DM 12/05/2009  . DYSPNEA 12/19/2009  . Edema 04/18/2012  . ERECTILE DYSFUNCTION 01/25/2007  . FATIGUE, CHRONIC 11/11/2009  . Gout 07/10/2012  . Heart murmur   . Hyperkalemia 04/18/2012  . Hyperlipidemia   . Hypertension   . HYPERTENSION 01/25/2007  . Hypoxia 05/05/2011  . Insomnia 05/13/2016  . INSOMNIA, HX OF 01/25/2007  . Knee pain, bilateral 12/28/2010  . Knee pain, right 12/28/2010  . NEUROMA 01/05/2010  . OBESITY NOS 01/25/2007  . OSA (obstructive sleep apnea) 07/13/2014  . PERIPHERAL NEUROPATHY 01/05/2010  . PULMONARY FUNCTION TESTS, ABNORMAL 02/02/2010  . SLEEP APNEA 04/06/2010   uses cpap  . Superficial thrombophlebitis of left leg 09/07/2011  . TESTICULAR  HYPOFUNCTION 01/05/2010  . 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 Toe pain, right 07/10/2012  . UNSPECIFIED ANEMIA 01/05/2010    Past Surgical History:  Procedure Laterality Date  . bilat foot surgery removed part of 5th metatarsal to corect  curvature of toes    . CARDIAC CATHETERIZATION  10/16/2009   nonischemic cardiomyopathy  . CARDIOVERSION N/A 03/25/2015   Procedure: CARDIOVERSION;  Surgeon: Pixie Casino, MD;  Location: [facility] ENDOSCOPY;  Service: Cardiovascular;  Laterality: N/A;    Current Medications: Outpatient Medications Prior to Visit  Medication Sig Dispense Refill  . albuterol (VENTOLIN HFA) 108 (90 Base) MCG/ACT inhaler Inhale 2 puffs into the lungs every 6 (six) hours as needed for wheezing or shortness of breath. 1 Inhaler 6  . allopurinol (ZYLOPRIM) 300 MG tablet TAKE ONE TABLET BY MOUTH ONCE DAILY 90 tablet 2  . aspirin EC 81 MG tablet Take 81 mg by mouth daily.    . benazepril (LOTENSIN) 40 MG tablet Take 1 tablet (40 mg total) by mouth daily. 90 tablet 3  . calcium carbonate (TUMS - DOSED IN MG ELEMENTAL CALCIUM) 500 MG chewable tablet Chew 1 tablet by mouth daily as needed. For heartburn    . cetirizine (ZYRTEC) 10 MG tablet Take 1 tablet (10 mg total) by mouth daily. 30 tablet 11  . Clobetasol Prop Emollient Base (CLOBETASOL PROPIONATE E) 0.05 % emollient cream Apply 1 application topically 2 (two) times daily as needed (insect bite). 60 g 1  . colchicine (COLCRYS) 0.6 MG tablet TAKE ONE TABLET BY MOUTH DAILY AS NEEDED FOR GOUT FLARE UPS. 30 tablet 0  . dofetilide (TIKOSYN) 500 MCG capsule Take 1 capsule (500 mcg total) by mouth 2 (two) times daily. 180 capsule 1  . fish oil-omega-3 fatty acids 1000 MG capsule Take 1 g by mouth daily.     . furosemide (LASIX) 40 MG tablet TAKE TWO TABLETS BY MOUTH EVERY MORNING AND TAKE ONE TABLET BY MOUTH EVERY AFTERNOON 270 tablet 3  . glyBURIDE (DIABETA) 5 MG tablet TAKE 1 TABLET BY MOUTH ONCE DAILY WITH BREAKFAST 90 tablet 0  . glyBURIDE (DIABETA) 5 MG tablet TAKE 1 TABLET BY MOUTH TWICE DAILY 180 tablet 1  . HYDROcodone-acetaminophen (NORCO) 10-325 MG tablet Take 1 tablet by mouth every 8 (eight) hours as needed for moderate pain or severe pain. 90 tablet 0  . KLOR-CON  M20 20 MEQ tablet TAKE ONE TABLET BY MOUTH ONCE DAILY AND  EXTRA  TABLET  IF  WEIGHT  IS  ABOVE  210  LBS 180 tablet 2  . LORazepam (ATIVAN) 1 MG tablet Take 1 tablet (1 mg total) by mouth every 8 (eight) hours as needed for anxiety. 30 tablet 3  . magnesium oxide (MAG-OX) 400 MG tablet Take 1 tablet (400 mg total) by mouth daily. 30 tablet 11  . metFORMIN (GLUCOPHAGE) 500 MG tablet TAKE 1 TABLET BY MOUTH THREE TIMES DAILY 270 tablet 0  . metoprolol (LOPRESSOR) 100 MG tablet TAKE ONE TABLET BY MOUTH TWICE DAILY 180 tablet 3  . Multiple Vitamins-Minerals (MULTIVITAMIN PO) Take 1 tablet by mouth daily.    . mupirocin ointment (BACTROBAN) 2 % Place 1 application into the nose 2 (two) times daily. 30 g 1  . naproxen (NAPROSYN) 375 MG tablet Take 1 tablet (375 mg total) by mouth daily. 30 tablet 4  . pravastatin (PRAVACHOL) 40 MG tablet TAKE ONE TABLET BY MOUTH ONCE DAILY 90 tablet 3  . temazepam (RESTORIL) 30 MG capsule TAKE 1 CAPSULE BY MOUTH  AT BEDTIME AS NEEDED FOR SLEEP 30 capsule 3  . Testosterone 10 MG/ACT (2%) GEL Apply 1 application topically daily. Use as directed    . warfarin (COUMADIN) 4 MG tablet TAKE 1/2 TO 1 (ONE-HALF TO ONE) TABLET BY MOUTH DAILY OR AS DIRECTED BY COUMADIN CLINIC 90 tablet 1   No facility-administered medications prior to visit.      Allergies:   Sulfonamide derivatives and Zolpidem   Social History   Socioeconomic History  . Marital status: Married    Spouse name: None  . Number of children: None  . Years of education: None  . Highest education level: None  Social Needs  . Financial resource strain: None  . Food insecurity - worry: None  . Food insecurity - inability: None  . Transportation needs - medical: None  . Transportation needs - non-medical: None  Occupational History  . None  Tobacco Use  . Smoking status: Former Smoker    Packs/day: 1.00    Years: 10.00    Pack years: 10.00    Last attempt to quit: 06/07/1980    Years since quitting: 37.0   . Smokeless tobacco: Never Used  Substance and Sexual Activity  . Alcohol use: No  . Drug use: No  . Sexual activity: Not Currently  Other Topics Concern  . None  Social History Narrative  . None     Family History:  The patient's family history includes Aneurysm in his sister; Cancer in his father; Clotting disorder in his mother; Diabetes in his mother and sister; Heart attack in his mother; Heart disease in his mother; Hyperlipidemia in his mother; Hypertension in his mother and sister; Leukemia in his maternal grandmother; Lung disease in his father; Other in his sister; Seizures in his sister; Stroke in his mother.   ROS:   Please see the history of present illness.    ROS All other systems reviewed and are negative.   PHYSICAL EXAM:   VS:  BP 108/78   Pulse (!) 131   Ht 5\' 11"  (1.803 m)   Wt 221 lb (100.2 kg)   BMI 30.82 kg/m      General: Alert, oriented x3, no distress, mildly obese Head: no evidence of trauma, PERRL, EOMI, no exophtalmos or lid lag, no myxedema, no xanthelasma; normal ears, nose and oropharynx Neck: normal jugular venous pulsations and no hepatojugular reflux; brisk carotid pulses without delay and no carotid bruits Chest: clear to auscultation, no signs of consolidation by percussion or palpation, normal fremitus, symmetrical and full respiratory excursions Cardiovascular: normal position and quality of the apical impulse, rapid irregular rhythm, normal first and second heart sounds, no systolic murmurs, rubs or gallops, 3/6 decrescendo aortic insufficiency murmur heard best at the left upper sternal border Abdomen: no tenderness or distention, no masses by palpation, no abnormal pulsatility or arterial bruits, normal bowel sounds, no hepatosplenomegaly Extremities: no clubbing, cyanosis or edema; 2+ radial, ulnar and brachial pulses bilaterally; 2+ right femoral, posterior tibial and dorsalis pedis pulses; 2+ left femoral, posterior tibial and dorsalis  pedis pulses; no subclavian or femoral bruits Neurological: grossly nonfocal Psych: Normal mood and affect   Wt Readings from Last 3 Encounters:  06/14/17 221 lb (100.2 kg)  03/18/17 221 lb 3.2 oz (100.3 kg)  11/08/16 225 lb (102.1 kg)   Studies/Labs Reviewed:   EKG:  EKG is ordered today.  The ekg ordered today demonstrates Sinus rhythm with mild first-degree AV block , PR interval 218 ms, QTC 450 ms  Recent Labs: 03/14/2017: Hemoglobin 15.3; Magnesium 2.0; Platelets 207.0; TSH 1.89 03/18/2017: ALT 12; BUN 37; Creatinine, Ser 1.70; Potassium 4.5; Sodium 140   Lipid Panel    Component Value Date/Time   CHOL 134 03/14/2017 1126   TRIG 158.0 (H) 03/14/2017 1126   HDL 31.60 (L) 03/14/2017 1126   CHOLHDL 4 03/14/2017 1126   VLDL 31.6 03/14/2017 1126   LDLCALC 71 03/14/2017 1126     ASSESSMENT:    1. Chronic diastolic congestive heart failure (HCC)   2. Persistent atrial fibrillation ([place])   3. Aortic valve insufficiency, etiology of cardiac valve disease unspecified   4. OSA (obstructive sleep apnea)   5. Class 1 obesity due to excess calories with serious comorbidity and body mass index (BMI) of 30.0 to 30.9 in adult   6. Essential hypertension, malignant   7. Long term (current) use of anticoagulants   8. Encounter for monitoring dofetilide therapy   9. Pre-procedure lab exam      PLAN:  In order of problems listed above:  1. CHF (predominantly diastolic): Seems to be euvolemic from a clinical point of view and his shortness of breath is likely related to the arrhythmia. He actually weighs 4 pounds less than he did at his last appointment. 2. AFib: Overall excellent response to treatment with Tikosyn. Now with what appears to be a protracted breakthrough episode of persistent atrial fibrillation, symptomatic for a couple of weeks.. When he initially presented with sustained atrial flutter he had decreased left ventricular systolic function due to tachycardia related  cardiomyopathy. Over last 3 months anticoagulation has been within desirable range. Will schedule him for cardioversion for symptomatic atrial fibrillation with difficult rate control. He is already on a relatively high dose of beta blocker and when he was in sinus rhythm his typical ventricular rate was in the 60s. Not a lot of room to increase rate control medications. This procedure has been fully reviewed with the patient and written informed consent has been obtained. He was intolerant of amiodarone due to intractable nausea and vomiting. CHADSVAsc 3 (HTN, CHF, DM). Reminded him of the risk of drug interactions with dofetilide and with warfarin. 3. Moderate AI: Stable severity on follow-up echo. Hypertension is likely the cause of his dilated aortic root, in turn the cause for his aortic valve insufficiency. He understands that if the aortic insufficiency worsens or is is evidence of excessive left ventricular dilation he may need right and left heart catheterization followed by surgical repair. It's time to reevaluate the severity of aortic insufficiency by echo. 4. OSA: He reports 100% compliance with CPAP. He has difficulty falling asleep and staying asleep and is taking benzodiazepines. 5. Obesity: Repeated encouragement for weight loss. He is unable to exercise due to his knee problems, but I suspect there is room to improve his caloric intake. 6. Malignant hypertension: Excellent control on current regimen 7. Warfarin: INR 2.5 today 8. Tikosyn: QT interval in desirable range at his previous ECG, will repeat electrocardiogram after cardioversion to sinus rhythm.   Medication Adjustments/Labs and Tests Ordered: Current medicines are reviewed at length with the patient today.  Concerns regarding medicines are outlined above.  Medication changes, Labs and Tests ordered today are listed in the Patient Instructions below. Patient Instructions  You are scheduled for a Cardioversion on Friday,  January 11th, 2019 with Dr. Sallyanne Kuster.  Please arrive at the [facility] (Main Entrance A) at [facility]: [address] [place], [place] [address] at 10:45 am.  DIET: Nothing to eat or drink after midnight except a sip of water with medications (see medication instructions below)  Medication Instructions: Continue ALL current medications INCLUDING your Warfarin  Labs: TODAY  You must have a responsible person to drive you home and stay in the waiting area during your procedure. Failure to do so could result in cancellation.  Bring your insurance cards.  *Special Note: Every effort is made to have your procedure done on time. Occasionally there are emergencies that occur at the hospital that may cause delays. Please be patient if a delay does occur.    Electrical Cardioversion Electrical cardioversion is the delivery of a jolt of electricity to restore a normal rhythm to the heart. A rhythm that is too fast or is not regular keeps the heart from pumping well. In this procedure, sticky patches or metal paddles are placed on the chest to deliver electricity to the heart from a device. This procedure may be done in an emergency if:  There is low or no blood pressure as a result of the heart rhythm.  Normal rhythm must be restored as fast as possible to protect the brain and heart from further damage.  It may save a life.  This procedure may also be done for irregular or fast heart rhythms that are not immediately life-threatening. Tell a health care provider about:  Any allergies you have.  All medicines you are taking, including vitamins, herbs, eye drops, creams, and over-the-counter medicines.  Any problems you or family members have had with anesthetic medicines.  Any blood disorders you have.  Any surgeries you have had.  Any medical conditions you have.  Whether you are pregnant or may be pregnant. What are the risks? Generally, this is a safe procedure. However,  problems may occur, including:  Allergic reactions to medicines.  A blood clot that breaks free and travels to other parts of your body.  The possible return of an abnormal heart rhythm within hours or days after the procedure.  Your heart stopping (cardiac arrest). This is rare.  What happens before the procedure? Medicines  Your health care provider may have you start taking: ? Blood-thinning medicines (anticoagulants) so your blood does not clot as easily. ? Medicines may be given to help stabilize your heart rate and rhythm.  Ask your health care provider about changing or stopping your regular medicines. This is especially important if you are taking diabetes medicines or blood thinners. General instructions  Plan to have someone take you home from the hospital or clinic.  If you will be going home right after the procedure, plan to have someone with you for 24 hours.  Follow instructions from your health care provider about eating or drinking restrictions. What happens during the procedure?  To lower your risk of infection: ? Your health care team will wash or sanitize their hands. ? Your skin will be washed with soap.  An IV tube will be inserted into one of your veins.  You will be given a medicine to help you relax (sedative).  Sticky patches (electrodes) or metal paddles may be placed on your chest.  An electrical shock will be delivered. The procedure may vary among health care providers and hospitals. What happens after the procedure?  Your blood pressure, heart rate, breathing rate, and blood oxygen level will be monitored until the medicines you were given have worn off.  Do not drive for 24 hours if you were given a sedative.  Your heart  rhythm will be watched to make sure it does not change. This information is not intended to replace advice given to you by your health care provider. Make sure you discuss any questions you have with your health care  provider. Document Released: 05/14/2002 Document Revised: 01/21/2016 Document Reviewed: 11/28/2015 Elsevier Interactive Patient Education  2017 [place], Sanda Klein, MD  06/14/2017 1:27 PM    [facility] [place], [place], [place]  [address] Phone: [phone]; Fax: [phone]

## 2017-06-14 NOTE — H&P (View-Only) (Signed)
Cardiology Office Note    Date:  06/14/2017   ID:  [name] [birth year], MRN [id number]  PCP:  Kevin Lukes, MD  Cardiologist:   Kevin Klein, MD   Chief Complaint  Patient presents with  . Follow-up    6 months  . Shortness of Breath  . Fatigue    History of Present Illness:  [name] is a 67 y.o. male with aortic insufficiency, diastolic heart failure, paroxysmal atrial fibrillation and atrial flutter, obstructive sleep apnea, dyslipidemia in the setting of obesity and type 2 diabetes mellitus.  He presents today with approximately 2 weeks of worsening shortness of breath NYHA functional class II-III. He is not aware of any palpitations and has been unable to tell when he has atrial fibrillation for the last 2 or 3 years. His rhythm today is atrial fibrillation with rapid ventricular response at about 130 beats per minutes. She has been compliant with dofetilide (he is receiving a new type of generic dofetilide), metoprolol and warfarin. His INR is consistently therapeutic. He has not had any embolic or bleeding complications  The QT interval is difficult to measure today due to the irregular rhythm tachycardia and irregular baseline  The patient specifically denies any chest pain at rest or exertion, orthopnea, paroxysmal nocturnal dyspnea, syncope, palpitations, focal neurological deficits, intermittent claudication, lower extremity edema, unexplained weight gain, cough, hemoptysis or wheezing. Has been taking naproxen once daily for a sore tooth but has not had any GI complaints or bleeding.  Echocardiogram December 31, 2015:  normal left ventricular systolic function EF 00%, moderate LVH and moderate to severely dilated left atrium, suspicious for diastolic dysfunction and elevated filling pressures (although no comment was made on diastolic function on the report.). He has moderate aortic insufficiency due to dilated root (unchanged). The estimated systolic PA  pressure was around 60 mmHg.[name] He has a long-standing history of severe hypertension which has not always been well treated. He also has long-standing history of obstructive sleep apnea which was also not treated until recently, but he is now 100% CPAP-compliant. Coronary angiography 2011 did not show evidence of significant stenoses. He presented with typical atrial flutter in 2011 and has recurrent paroxysmal atrial fibrillation with a good response to treatment with dofetilide. Additional problems include obesity, type 2 diabetes mellitus, gout and androgen deficiency. He had a successful cardioversion on March 25 2015 for persistent atrial fibrillation.   Past Medical History:  Diagnosis Date  . AORTIC STENOSIS, MODERATE 12/05/2009  . Atrial fibrillation ([place])   . Atrial flutter ([place]) 12/05/2009  . Atypical chest pain 11/08/2011  . BENIGN PROSTATIC HYPERTROPHY, HX OF 12/05/2009  . CHEST PAIN-UNSPECIFIED 11/11/2009  . CHF 12/05/2009  . Chronic renal insufficiency 12/16/2016  . Debility 04/18/2012  . Diarrhea 12/16/2016  . DM 12/05/2009  . DYSPNEA 12/19/2009  . Edema 04/18/2012  . ERECTILE DYSFUNCTION 01/25/2007  . FATIGUE, CHRONIC 11/11/2009  . Gout 07/10/2012  . Heart murmur   . Hyperkalemia 04/18/2012  . Hyperlipidemia   . Hypertension   . HYPERTENSION 01/25/2007  . Hypoxia 05/05/2011  . Insomnia 05/13/2016  . INSOMNIA, HX OF 01/25/2007  . Knee pain, bilateral 12/28/2010  . Knee pain, right 12/28/2010  . NEUROMA 01/05/2010  . OBESITY NOS 01/25/2007  . OSA (obstructive sleep apnea) 07/13/2014  . PERIPHERAL NEUROPATHY 01/05/2010  . PULMONARY FUNCTION TESTS, ABNORMAL 02/02/2010  . SLEEP APNEA 04/06/2010   uses cpap  . Superficial thrombophlebitis of left leg 09/07/2011  . TESTICULAR  HYPOFUNCTION 01/05/2010  . 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 Toe pain, right 07/10/2012  . UNSPECIFIED ANEMIA 01/05/2010    Past Surgical History:  Procedure Laterality Date  . bilat foot surgery removed part of 5th metatarsal to corect  curvature of toes    . CARDIAC CATHETERIZATION  10/16/2009   nonischemic cardiomyopathy  . CARDIOVERSION N/A 03/25/2015   Procedure: CARDIOVERSION;  Surgeon: Pixie Casino, MD;  Location: [facility] ENDOSCOPY;  Service: Cardiovascular;  Laterality: N/A;    Current Medications: Outpatient Medications Prior to Visit  Medication Sig Dispense Refill  . albuterol (VENTOLIN HFA) 108 (90 Base) MCG/ACT inhaler Inhale 2 puffs into the lungs every 6 (six) hours as needed for wheezing or shortness of breath. 1 Inhaler 6  . allopurinol (ZYLOPRIM) 300 MG tablet TAKE ONE TABLET BY MOUTH ONCE DAILY 90 tablet 2  . aspirin EC 81 MG tablet Take 81 mg by mouth daily.    . benazepril (LOTENSIN) 40 MG tablet Take 1 tablet (40 mg total) by mouth daily. 90 tablet 3  . calcium carbonate (TUMS - DOSED IN MG ELEMENTAL CALCIUM) 500 MG chewable tablet Chew 1 tablet by mouth daily as needed. For heartburn    . cetirizine (ZYRTEC) 10 MG tablet Take 1 tablet (10 mg total) by mouth daily. 30 tablet 11  . Clobetasol Prop Emollient Base (CLOBETASOL PROPIONATE E) 0.05 % emollient cream Apply 1 application topically 2 (two) times daily as needed (insect bite). 60 g 1  . colchicine (COLCRYS) 0.6 MG tablet TAKE ONE TABLET BY MOUTH DAILY AS NEEDED FOR GOUT FLARE UPS. 30 tablet 0  . dofetilide (TIKOSYN) 500 MCG capsule Take 1 capsule (500 mcg total) by mouth 2 (two) times daily. 180 capsule 1  . fish oil-omega-3 fatty acids 1000 MG capsule Take 1 g by mouth daily.     . furosemide (LASIX) 40 MG tablet TAKE TWO TABLETS BY MOUTH EVERY MORNING AND TAKE ONE TABLET BY MOUTH EVERY AFTERNOON 270 tablet 3  . glyBURIDE (DIABETA) 5 MG tablet TAKE 1 TABLET BY MOUTH ONCE DAILY WITH BREAKFAST 90 tablet 0  . glyBURIDE (DIABETA) 5 MG tablet TAKE 1 TABLET BY MOUTH TWICE DAILY 180 tablet 1  . HYDROcodone-acetaminophen (NORCO) 10-325 MG tablet Take 1 tablet by mouth every 8 (eight) hours as needed for moderate pain or severe pain. 90 tablet 0  . KLOR-CON  M20 20 MEQ tablet TAKE ONE TABLET BY MOUTH ONCE DAILY AND  EXTRA  TABLET  IF  WEIGHT  IS  ABOVE  210  LBS 180 tablet 2  . LORazepam (ATIVAN) 1 MG tablet Take 1 tablet (1 mg total) by mouth every 8 (eight) hours as needed for anxiety. 30 tablet 3  . magnesium oxide (MAG-OX) 400 MG tablet Take 1 tablet (400 mg total) by mouth daily. 30 tablet 11  . metFORMIN (GLUCOPHAGE) 500 MG tablet TAKE 1 TABLET BY MOUTH THREE TIMES DAILY 270 tablet 0  . metoprolol (LOPRESSOR) 100 MG tablet TAKE ONE TABLET BY MOUTH TWICE DAILY 180 tablet 3  . Multiple Vitamins-Minerals (MULTIVITAMIN PO) Take 1 tablet by mouth daily.    . mupirocin ointment (BACTROBAN) 2 % Place 1 application into the nose 2 (two) times daily. 30 g 1  . naproxen (NAPROSYN) 375 MG tablet Take 1 tablet (375 mg total) by mouth daily. 30 tablet 4  . pravastatin (PRAVACHOL) 40 MG tablet TAKE ONE TABLET BY MOUTH ONCE DAILY 90 tablet 3  . temazepam (RESTORIL) 30 MG capsule TAKE 1 CAPSULE BY MOUTH  AT BEDTIME AS NEEDED FOR SLEEP 30 capsule 3  . Testosterone 10 MG/ACT (2%) GEL Apply 1 application topically daily. Use as directed    . warfarin (COUMADIN) 4 MG tablet TAKE 1/2 TO 1 (ONE-HALF TO ONE) TABLET BY MOUTH DAILY OR AS DIRECTED BY COUMADIN CLINIC 90 tablet 1   No facility-administered medications prior to visit.      Allergies:   Sulfonamide derivatives and Zolpidem   Social History   Socioeconomic History  . Marital status: Married    Spouse name: None  . Number of children: None  . Years of education: None  . Highest education level: None  Social Needs  . Financial resource strain: None  . Food insecurity - worry: None  . Food insecurity - inability: None  . Transportation needs - medical: None  . Transportation needs - non-medical: None  Occupational History  . None  Tobacco Use  . Smoking status: Former Smoker    Packs/day: 1.00    Years: 10.00    Pack years: 10.00    Last attempt to quit: 06/07/1980    Years since quitting: 37.0   . Smokeless tobacco: Never Used  Substance and Sexual Activity  . Alcohol use: No  . Drug use: No  . Sexual activity: Not Currently  Other Topics Concern  . None  Social History Narrative  . None     Family History:  The patient's family history includes Aneurysm in his sister; Cancer in his father; Clotting disorder in his mother; Diabetes in his mother and sister; Heart attack in his mother; Heart disease in his mother; Hyperlipidemia in his mother; Hypertension in his mother and sister; Leukemia in his maternal grandmother; Lung disease in his father; Other in his sister; Seizures in his sister; Stroke in his mother.   ROS:   Please see the history of present illness.    ROS All other systems reviewed and are negative.   PHYSICAL EXAM:   VS:  BP 108/78   Pulse (!) 131   Ht 5\' 11"  (1.803 m)   Wt 221 lb (100.2 kg)   BMI 30.82 kg/m      General: Alert, oriented x3, no distress, mildly obese Head: no evidence of trauma, PERRL, EOMI, no exophtalmos or lid lag, no myxedema, no xanthelasma; normal ears, nose and oropharynx Neck: normal jugular venous pulsations and no hepatojugular reflux; brisk carotid pulses without delay and no carotid bruits Chest: clear to auscultation, no signs of consolidation by percussion or palpation, normal fremitus, symmetrical and full respiratory excursions Cardiovascular: normal position and quality of the apical impulse, rapid irregular rhythm, normal first and second heart sounds, no systolic murmurs, rubs or gallops, 3/6 decrescendo aortic insufficiency murmur heard best at the left upper sternal border Abdomen: no tenderness or distention, no masses by palpation, no abnormal pulsatility or arterial bruits, normal bowel sounds, no hepatosplenomegaly Extremities: no clubbing, cyanosis or edema; 2+ radial, ulnar and brachial pulses bilaterally; 2+ right femoral, posterior tibial and dorsalis pedis pulses; 2+ left femoral, posterior tibial and dorsalis  pedis pulses; no subclavian or femoral bruits Neurological: grossly nonfocal Psych: Normal mood and affect   Wt Readings from Last 3 Encounters:  06/14/17 221 lb (100.2 kg)  03/18/17 221 lb 3.2 oz (100.3 kg)  11/08/16 225 lb (102.1 kg)   Studies/Labs Reviewed:   EKG:  EKG is ordered today.  The ekg ordered today demonstrates Sinus rhythm with mild first-degree AV block , PR interval 218 ms, QTC 450 ms  Recent Labs: 03/14/2017: Hemoglobin 15.3; Magnesium 2.0; Platelets 207.0; TSH 1.89 03/18/2017: ALT 12; BUN 37; Creatinine, Ser 1.70; Potassium 4.5; Sodium 140   Lipid Panel    Component Value Date/Time   CHOL 134 03/14/2017 1126   TRIG 158.0 (H) 03/14/2017 1126   HDL 31.60 (L) 03/14/2017 1126   CHOLHDL 4 03/14/2017 1126   VLDL 31.6 03/14/2017 1126   LDLCALC 71 03/14/2017 1126     ASSESSMENT:    1. Chronic diastolic congestive heart failure (HCC)   2. Persistent atrial fibrillation ([place])   3. Aortic valve insufficiency, etiology of cardiac valve disease unspecified   4. OSA (obstructive sleep apnea)   5. Class 1 obesity due to excess calories with serious comorbidity and body mass index (BMI) of 30.0 to 30.9 in adult   6. Essential hypertension, malignant   7. Long term (current) use of anticoagulants   8. Encounter for monitoring dofetilide therapy   9. Pre-procedure lab exam      PLAN:  In order of problems listed above:  1. CHF (predominantly diastolic): Seems to be euvolemic from a clinical point of view and his shortness of breath is likely related to the arrhythmia. He actually weighs 4 pounds less than he did at his last appointment. 2. AFib: Overall excellent response to treatment with Tikosyn. Now with what appears to be a protracted breakthrough episode of persistent atrial fibrillation, symptomatic for a couple of weeks.. When he initially presented with sustained atrial flutter he had decreased left ventricular systolic function due to tachycardia related  cardiomyopathy. Over last 3 months anticoagulation has been within desirable range. Will schedule him for cardioversion for symptomatic atrial fibrillation with difficult rate control. He is already on a relatively high dose of beta blocker and when he was in sinus rhythm his typical ventricular rate was in the 60s. Not a lot of room to increase rate control medications. This procedure has been fully reviewed with the patient and written informed consent has been obtained. He was intolerant of amiodarone due to intractable nausea and vomiting. CHADSVAsc 3 (HTN, CHF, DM). Reminded him of the risk of drug interactions with dofetilide and with warfarin. 3. Moderate AI: Stable severity on follow-up echo. Hypertension is likely the cause of his dilated aortic root, in turn the cause for his aortic valve insufficiency. He understands that if the aortic insufficiency worsens or is is evidence of excessive left ventricular dilation he may need right and left heart catheterization followed by surgical repair. It's time to reevaluate the severity of aortic insufficiency by echo. 4. OSA: He reports 100% compliance with CPAP. He has difficulty falling asleep and staying asleep and is taking benzodiazepines. 5. Obesity: Repeated encouragement for weight loss. He is unable to exercise due to his knee problems, but I suspect there is room to improve his caloric intake. 6. Malignant hypertension: Excellent control on current regimen 7. Warfarin: INR 2.5 today 8. Tikosyn: QT interval in desirable range at his previous ECG, will repeat electrocardiogram after cardioversion to sinus rhythm.   Medication Adjustments/Labs and Tests Ordered: Current medicines are reviewed at length with the patient today.  Concerns regarding medicines are outlined above.  Medication changes, Labs and Tests ordered today are listed in the Patient Instructions below. Patient Instructions  You are scheduled for a Cardioversion on Friday,  January 11th, 2019 with Dr. Sallyanne Kuster.  Please arrive at the [facility] (Main Entrance A) at [facility]: [address] [place], [place] [address] at 10:45 am.  DIET: Nothing to eat or drink after midnight except a sip of water with medications (see medication instructions below)  Medication Instructions: Continue ALL current medications INCLUDING your Warfarin  Labs: TODAY  You must have a responsible person to drive you home and stay in the waiting area during your procedure. Failure to do so could result in cancellation.  Bring your insurance cards.  *Special Note: Every effort is made to have your procedure done on time. Occasionally there are emergencies that occur at the hospital that may cause delays. Please be patient if a delay does occur.    Electrical Cardioversion Electrical cardioversion is the delivery of a jolt of electricity to restore a normal rhythm to the heart. A rhythm that is too fast or is not regular keeps the heart from pumping well. In this procedure, sticky patches or metal paddles are placed on the chest to deliver electricity to the heart from a device. This procedure may be done in an emergency if:  There is low or no blood pressure as a result of the heart rhythm.  Normal rhythm must be restored as fast as possible to protect the brain and heart from further damage.  It may save a life.  This procedure may also be done for irregular or fast heart rhythms that are not immediately life-threatening. Tell a health care provider about:  Any allergies you have.  All medicines you are taking, including vitamins, herbs, eye drops, creams, and over-the-counter medicines.  Any problems you or family members have had with anesthetic medicines.  Any blood disorders you have.  Any surgeries you have had.  Any medical conditions you have.  Whether you are pregnant or may be pregnant. What are the risks? Generally, this is a safe procedure. However,  problems may occur, including:  Allergic reactions to medicines.  A blood clot that breaks free and travels to other parts of your body.  The possible return of an abnormal heart rhythm within hours or days after the procedure.  Your heart stopping (cardiac arrest). This is rare.  What happens before the procedure? Medicines  Your health care provider may have you start taking: ? Blood-thinning medicines (anticoagulants) so your blood does not clot as easily. ? Medicines may be given to help stabilize your heart rate and rhythm.  Ask your health care provider about changing or stopping your regular medicines. This is especially important if you are taking diabetes medicines or blood thinners. General instructions  Plan to have someone take you home from the hospital or clinic.  If you will be going home right after the procedure, plan to have someone with you for 24 hours.  Follow instructions from your health care provider about eating or drinking restrictions. What happens during the procedure?  To lower your risk of infection: ? Your health care team will wash or sanitize their hands. ? Your skin will be washed with soap.  An IV tube will be inserted into one of your veins.  You will be given a medicine to help you relax (sedative).  Sticky patches (electrodes) or metal paddles may be placed on your chest.  An electrical shock will be delivered. The procedure may vary among health care providers and hospitals. What happens after the procedure?  Your blood pressure, heart rate, breathing rate, and blood oxygen level will be monitored until the medicines you were given have worn off.  Do not drive for 24 hours if you were given a sedative.  Your heart  rhythm will be watched to make sure it does not change. This information is not intended to replace advice given to you by your health care provider. Make sure you discuss any questions you have with your health care  provider. Document Released: 05/14/2002 Document Revised: 01/21/2016 Document Reviewed: 11/28/2015 Elsevier Interactive Patient Education  2017 [place], Kevin Klein, MD  06/14/2017 1:27 PM    [facility] [place], [place], [place]  [address] Phone: [phone]; Fax: [phone]

## 2017-06-14 NOTE — Patient Instructions (Addendum)
You are scheduled for a Cardioversion on Friday, January 11th, 2019 with Dr. Sallyanne Kuster.  Please arrive at the [facility] (Main Entrance A) at [facility]: [address] [place], [place] [address] at 10:45 am.  DIET: Nothing to eat or drink after midnight except a sip of water with medications (see medication instructions below)  Medication Instructions: Continue ALL current medications INCLUDING your Warfarin  Labs: TODAY  You must have a responsible person to drive you home and stay in the waiting area during your procedure. Failure to do so could result in cancellation.  Bring your insurance cards.  *Special Note: Every effort is made to have your procedure done on time. Occasionally there are emergencies that occur at the hospital that may cause delays. Please be patient if a delay does occur.    Electrical Cardioversion Electrical cardioversion is the delivery of a jolt of electricity to restore a normal rhythm to the heart. A rhythm that is too fast or is not regular keeps the heart from pumping well. In this procedure, sticky patches or metal paddles are placed on the chest to deliver electricity to the heart from a device. This procedure may be done in an emergency if:  There is low or no blood pressure as a result of the heart rhythm.  Normal rhythm must be restored as fast as possible to protect the brain and heart from further damage.  It may save a life.  This procedure may also be done for irregular or fast heart rhythms that are not immediately life-threatening. Tell a health care provider about:  Any allergies you have.  All medicines you are taking, including vitamins, herbs, eye drops, creams, and over-the-counter medicines.  Any problems you or family members have had with anesthetic medicines.  Any blood disorders you have.  Any surgeries you have had.  Any medical conditions you have.  Whether you are pregnant or may be pregnant. What are the  risks? Generally, this is a safe procedure. However, problems may occur, including:  Allergic reactions to medicines.  A blood clot that breaks free and travels to other parts of your body.  The possible return of an abnormal heart rhythm within hours or days after the procedure.  Your heart stopping (cardiac arrest). This is rare.  What happens before the procedure? Medicines  Your health care provider may have you start taking: ? Blood-thinning medicines (anticoagulants) so your blood does not clot as easily. ? Medicines may be given to help stabilize your heart rate and rhythm.  Ask your health care provider about changing or stopping your regular medicines. This is especially important if you are taking diabetes medicines or blood thinners. General instructions  Plan to have someone take you home from the hospital or clinic.  If you will be going home right after the procedure, plan to have someone with you for 24 hours.  Follow instructions from your health care provider about eating or drinking restrictions. What happens during the procedure?  To lower your risk of infection: ? Your health care team will wash or sanitize their hands. ? Your skin will be washed with soap.  An IV tube will be inserted into one of your veins.  You will be given a medicine to help you relax (sedative).  Sticky patches (electrodes) or metal paddles may be placed on your chest.  An electrical shock will be delivered. The procedure may vary among health care providers and hospitals. What happens after the procedure?  Your blood pressure, heart rate, breathing rate, and blood oxygen level will be monitored until the medicines you were given have worn off.  Do not drive for 24 hours if you were given a sedative.  Your heart rhythm will be watched to make sure it does not change. This information is not intended to replace advice given to you by your health care provider. Make sure you  discuss any questions you have with your health care provider. Document Released: 05/14/2002 Document Revised: 01/21/2016 Document Reviewed: 11/28/2015 Elsevier Interactive Patient Education  2017 [employer].

## 2017-06-14 NOTE — Patient Instructions (Signed)
Description   Continue with 1/2 tablet daily except 1 tablet each Monday, Wednesday and Friday. repeat INR in 6 weeks.  Next INR in 6 weeks

## 2017-06-16 ENCOUNTER — Other Ambulatory Visit: Payer: Self-pay

## 2017-06-16 ENCOUNTER — Ambulatory Visit (HOSPITAL_COMMUNITY): Payer: Medicare Other | Attending: Cardiovascular Disease

## 2017-06-16 DIAGNOSIS — I420 Dilated cardiomyopathy: Secondary | ICD-10-CM | POA: Insufficient documentation

## 2017-06-16 DIAGNOSIS — I080 Rheumatic disorders of both mitral and aortic valves: Secondary | ICD-10-CM | POA: Insufficient documentation

## 2017-06-16 DIAGNOSIS — I351 Nonrheumatic aortic (valve) insufficiency: Secondary | ICD-10-CM

## 2017-06-16 LAB — ECHOCARDIOGRAM COMPLETE
Ao-asc: 39 cm
FS: 16 % — AB (ref 28–44)
IVS/LV PW RATIO, ED: 1.01
LA ID, A-P, ES: 45 mm
LA diam end sys: 45 mm
LA diam index: 1.99 cm/m2
LA vol A4C: 83.4 ml
LA vol index: 38 mL/m2
LA vol: 86.2 mL
LV PW d: 13.7 mm — AB (ref 0.6–1.1)
LVOT SV: 23 mL
LVOT VTI: 12.8 cm
LVOT area: 1.77 cm2
LVOT diameter: 15 mm
LVOT peak vel: 80.1 cm/s
Lateral S' vel: 8.11 cm/s
TAPSE: 20.1 mm

## 2017-06-17 ENCOUNTER — Ambulatory Visit (HOSPITAL_COMMUNITY): Payer: Medicare Other | Admitting: Anesthesiology

## 2017-06-17 ENCOUNTER — Ambulatory Visit: Payer: Medicare Other | Admitting: Family Medicine

## 2017-06-17 ENCOUNTER — Encounter (HOSPITAL_COMMUNITY): Payer: Self-pay | Admitting: *Deleted

## 2017-06-17 ENCOUNTER — Encounter (HOSPITAL_COMMUNITY)
Admission: RE | Disposition: A | Discharge status: Home / Self Care | Payer: Self-pay | Source: Ambulatory Visit | Attending: Cardiovascular Disease

## 2017-06-17 ENCOUNTER — Ambulatory Visit (HOSPITAL_COMMUNITY)
Admission: RE | Admit: 2017-06-17 | Discharge: 2017-06-17 | Disposition: A | Discharge status: Home / Self Care | Payer: Medicare Other | Source: Ambulatory Visit | Attending: Cardiovascular Disease | Admitting: Cardiovascular Disease

## 2017-06-17 DIAGNOSIS — I130 Hypertensive heart and chronic kidney disease with heart failure and stage 1 through stage 4 chronic kidney disease, or unspecified chronic kidney disease: Secondary | ICD-10-CM | POA: Insufficient documentation

## 2017-06-17 DIAGNOSIS — Z7984 Long term (current) use of oral hypoglycemic drugs: Secondary | ICD-10-CM | POA: Insufficient documentation

## 2017-06-17 DIAGNOSIS — M109 Gout, unspecified: Secondary | ICD-10-CM | POA: Insufficient documentation

## 2017-06-17 DIAGNOSIS — E785 Hyperlipidemia, unspecified: Secondary | ICD-10-CM | POA: Insufficient documentation

## 2017-06-17 DIAGNOSIS — I4891 Unspecified atrial fibrillation: Secondary | ICD-10-CM

## 2017-06-17 DIAGNOSIS — I5032 Chronic diastolic (congestive) heart failure: Secondary | ICD-10-CM | POA: Diagnosis not present

## 2017-06-17 DIAGNOSIS — Z7982 Long term (current) use of aspirin: Secondary | ICD-10-CM | POA: Diagnosis not present

## 2017-06-17 DIAGNOSIS — I110 Hypertensive heart disease with heart failure: Secondary | ICD-10-CM | POA: Diagnosis not present

## 2017-06-17 DIAGNOSIS — Z6830 Body mass index (BMI) 30.0-30.9, adult: Secondary | ICD-10-CM | POA: Diagnosis not present

## 2017-06-17 DIAGNOSIS — I4819 Other persistent atrial fibrillation: Secondary | ICD-10-CM

## 2017-06-17 DIAGNOSIS — Z888 Allergy status to other drugs, medicaments and biological substances status: Secondary | ICD-10-CM | POA: Insufficient documentation

## 2017-06-17 DIAGNOSIS — I483 Typical atrial flutter: Secondary | ICD-10-CM | POA: Diagnosis not present

## 2017-06-17 DIAGNOSIS — Z882 Allergy status to sulfonamides status: Secondary | ICD-10-CM | POA: Insufficient documentation

## 2017-06-17 DIAGNOSIS — I480 Paroxysmal atrial fibrillation: Secondary | ICD-10-CM | POA: Diagnosis not present

## 2017-06-17 DIAGNOSIS — N189 Chronic kidney disease, unspecified: Secondary | ICD-10-CM | POA: Diagnosis not present

## 2017-06-17 DIAGNOSIS — E1142 Type 2 diabetes mellitus with diabetic polyneuropathy: Secondary | ICD-10-CM | POA: Insufficient documentation

## 2017-06-17 DIAGNOSIS — D649 Anemia, unspecified: Secondary | ICD-10-CM | POA: Diagnosis not present

## 2017-06-17 DIAGNOSIS — Z79899 Other long term (current) drug therapy: Secondary | ICD-10-CM | POA: Diagnosis not present

## 2017-06-17 DIAGNOSIS — R011 Cardiac murmur, unspecified: Secondary | ICD-10-CM | POA: Insufficient documentation

## 2017-06-17 DIAGNOSIS — I481 Persistent atrial fibrillation: Secondary | ICD-10-CM | POA: Insufficient documentation

## 2017-06-17 DIAGNOSIS — Z7901 Long term (current) use of anticoagulants: Secondary | ICD-10-CM | POA: Diagnosis not present

## 2017-06-17 DIAGNOSIS — G4733 Obstructive sleep apnea (adult) (pediatric): Secondary | ICD-10-CM | POA: Insufficient documentation

## 2017-06-17 DIAGNOSIS — Z87891 Personal history of nicotine dependence: Secondary | ICD-10-CM | POA: Diagnosis not present

## 2017-06-17 DIAGNOSIS — E6609 Other obesity due to excess calories: Secondary | ICD-10-CM | POA: Diagnosis not present

## 2017-06-17 DIAGNOSIS — Z9989 Dependence on other enabling machines and devices: Secondary | ICD-10-CM | POA: Diagnosis not present

## 2017-06-17 DIAGNOSIS — I351 Nonrheumatic aortic (valve) insufficiency: Secondary | ICD-10-CM | POA: Insufficient documentation

## 2017-06-17 HISTORY — PX: CARDIOVERSION: SHX1299

## 2017-06-17 LAB — GLUCOSE, CAPILLARY: GLUCOSE-CAPILLARY: 115 mg/dL — AB (ref 65–99)

## 2017-06-17 SURGERY — CARDIOVERSION
Anesthesia: General

## 2017-06-17 MED ORDER — METOPROLOL TARTRATE 100 MG PO TABS: 150.0000 mg | ORAL_TABLET | Freq: Two times a day (BID) | ORAL | 3 refills | Status: DC

## 2017-06-17 MED ORDER — SODIUM CHLORIDE 0.9 % IV SOLN
INTRAVENOUS | Status: DC
  Administered 2017-06-17: 12:00:00 via INTRAVENOUS

## 2017-06-17 MED ORDER — HYDROCORTISONE 1 % EX CREA: 1.0000 "application " | TOPICAL_CREAM | Freq: Three times a day (TID) | CUTANEOUS | Status: DC | PRN

## 2017-06-17 MED ORDER — PROPOFOL 10 MG/ML IV BOLUS
INTRAVENOUS | Status: DC | PRN
  Administered 2017-06-17: 40 mg via INTRAVENOUS
  Administered 2017-06-17: 80 mg via INTRAVENOUS

## 2017-06-17 NOTE — Transfer of Care (Signed)
Immediate Anesthesia Transfer of Care Note  Patient: [name]  Procedure(s) Performed: CARDIOVERSION (N/A )  Patient Location: Endoscopy Unit  Anesthesia Type:General  Level of Consciousness: awake, alert  and oriented  Airway & Oxygen Therapy: Patient Spontanous Breathing and Patient connected to nasal cannula oxygen  Post-op Assessment: Report given to RN and Post -op Vital signs reviewed and stable  Post vital signs: Reviewed and stable  Last Vitals:  Vitals:   06/17/17 1202 06/17/17 1203  BP: (!) 86/46   Pulse: 84 82  Resp: (!) 25 (!) 38  Temp:    SpO2: 91% 90%    Last Pain:  Vitals:   06/17/17 1049  TempSrc: Oral         Complications: No apparent anesthesia complications

## 2017-06-17 NOTE — Anesthesia Postprocedure Evaluation (Signed)
Anesthesia Post Note  Patient: [name]  Procedure(s) Performed: CARDIOVERSION (N/A )     Patient location during evaluation: PACU Anesthesia Type: General Level of consciousness: awake and alert Pain management: pain level controlled Vital Signs Assessment: post-procedure vital signs reviewed and stable Respiratory status: spontaneous breathing, nonlabored ventilation, respiratory function stable and patient connected to nasal cannula oxygen Cardiovascular status: blood pressure returned to baseline and stable Postop Assessment: no apparent nausea or vomiting Anesthetic complications: no    Last Vitals:  Vitals:   06/17/17 1230 06/17/17 1241  BP: 92/66 92/66  Pulse: 79 78  Resp: (!) 31   Temp:    SpO2: 100% 97%    Last Pain:  Vitals:   06/17/17 1214  TempSrc: Oral                 Coreena Rubalcava COKER

## 2017-06-17 NOTE — Op Note (Signed)
Procedure: Electrical Cardioversion Indications:  Atrial Fibrillation  Procedure Details:  Consent: Risks of procedure as well as the alternatives and risks of each were explained to the (patient/caregiver).  Consent for procedure obtained.  Time Out: Verified patient identification, verified procedure, site/side was marked, verified correct patient position, special equipment/implants available, medications/allergies/relevent history reviewed, required imaging and test results available.  Performed  Patient placed on cardiac monitor, pulse oximetry, supplemental oxygen as necessary.  Sedation given: 80 mg IV propofol, Dr. Linna Caprice Pacer pads placed anterior and posterior chest.  Cardioverted 1 time(s).  Cardioversion with synchronized biphasic 120J shock.  Evaluation: Findings: Post procedure EKG shows: NSR 84 bpm Complications: None Patient did tolerate procedure well.  Time Spent Directly with the Patient:  30 minutes   Regene Mccarthy 06/17/2017, 11:56 AM

## 2017-06-17 NOTE — Interval H&P Note (Signed)
History and Physical Interval Note:  06/17/2017 10:55 AM  [name]  has presented today for surgery, with the diagnosis of AFIB  The various methods of treatment have been discussed with the patient and family. After consideration of risks, benefits and other options for treatment, the patient has consented to  Procedure(s): CARDIOVERSION (N/A) as a surgical intervention .  The patient's history has been reviewed, patient examined, no change in status, stable for surgery.  I have reviewed the patient's chart and labs.  Questions were answered to the patient's satisfaction.     Latanza Pfefferkorn

## 2017-06-17 NOTE — Anesthesia Preprocedure Evaluation (Addendum)
Anesthesia Evaluation  Patient identified by MRN, date of birth, ID band Patient awake    Reviewed: Allergy & Precautions, NPO status , Patient's Chart, lab work & pertinent test results  Airway Mallampati: II       Dental  (+) Dental Advisory Given, Teeth Intact   Pulmonary former smoker,    breath sounds clear to auscultation       Cardiovascular hypertension,  Rhythm:Irregular Rate:Normal     Neuro/Psych    GI/Hepatic   Endo/Other  diabetes  Renal/GU      Musculoskeletal   Abdominal   Peds  Hematology   Anesthesia Other Findings   Reproductive/Obstetrics                            Anesthesia Physical Anesthesia Plan  ASA: III  Anesthesia Plan: General   Post-op Pain Management:    Induction: Intravenous  PONV Risk Score and Plan: Propofol infusion and Ondansetron  Airway Management Planned: Mask  Additional Equipment:   Intra-op Plan:   Post-operative Plan:   Informed Consent: I have reviewed the patients History and Physical, chart, labs and discussed the procedure including the risks, benefits and alternatives for the proposed anesthesia with the patient or authorized representative who has indicated his/her understanding and acceptance.     Plan Discussed with: CRNA and Anesthesiologist  Anesthesia Plan Comments:         Anesthesia Quick Evaluation

## 2017-06-17 NOTE — Discharge Instructions (Signed)

## 2017-06-21 ENCOUNTER — Telehealth: Payer: Self-pay

## 2017-06-21 DIAGNOSIS — I5032 Chronic diastolic (congestive) heart failure: Secondary | ICD-10-CM

## 2017-06-21 NOTE — Telephone Encounter (Signed)
-----   Message from Sanda Klein, MD sent at 06/21/2017 10:30 AM EST -----   ----- Message ----- From: Sanda Klein, MD Sent: 06/17/2017   4:25 PM To: Romana Juniper  I will let Chelley put it in so that I do not mess it up (as I always do) MCr ----- Message ----- From: Brunetta Genera D Sent: 06/17/2017   2:21 PM To: Sanda Klein, MD  Hi Dr. Sallyanne Kuster,  The order is not in for the Limited Echo  For Me to schedule it.  Thanks   Dalene Seltzer  ----- Message ----- From: Sanda Klein, MD Sent: 06/17/2017  12:03 PM To: Romana Juniper  Please make an appointment with PA/NP for an ECG in 2-3 weeks, after cardioversion. Please schedule limited echo for LV EF in 3 months  (re: CHF, chronic - combined systolic and diastolic) and then follow up with me. Thank you,  MCr

## 2017-07-01 ENCOUNTER — Other Ambulatory Visit (HOSPITAL_COMMUNITY): Payer: Medicare Other

## 2017-07-05 ENCOUNTER — Telehealth: Payer: Self-pay | Admitting: Internal Medicine

## 2017-07-05 ENCOUNTER — Other Ambulatory Visit: Payer: Self-pay

## 2017-07-05 ENCOUNTER — Ambulatory Visit (INDEPENDENT_AMBULATORY_CARE_PROVIDER_SITE_OTHER): Payer: Medicare Other | Admitting: Family Medicine

## 2017-07-05 ENCOUNTER — Emergency Department (HOSPITAL_BASED_OUTPATIENT_CLINIC_OR_DEPARTMENT_OTHER): Payer: Medicare Other

## 2017-07-05 ENCOUNTER — Encounter: Payer: Self-pay | Admitting: Family Medicine

## 2017-07-05 ENCOUNTER — Encounter (HOSPITAL_BASED_OUTPATIENT_CLINIC_OR_DEPARTMENT_OTHER): Payer: Self-pay | Admitting: Emergency Medicine

## 2017-07-05 ENCOUNTER — Inpatient Hospital Stay (HOSPITAL_BASED_OUTPATIENT_CLINIC_OR_DEPARTMENT_OTHER)
Admission: EM | Admit: 2017-07-05 | Discharge: 2017-07-08 | DRG: 309 | Disposition: A | Discharge status: Home / Self Care | Payer: Medicare Other | Attending: Cardiology | Admitting: Cardiology

## 2017-07-05 VITALS — BP 122/79 | HR 123 | Temp 98.4°F | Resp 16 | Ht 71.0 in | Wt 219.2 lb

## 2017-07-05 DIAGNOSIS — E669 Obesity, unspecified: Secondary | ICD-10-CM | POA: Diagnosis present

## 2017-07-05 DIAGNOSIS — E1169 Type 2 diabetes mellitus with other specified complication: Secondary | ICD-10-CM | POA: Diagnosis not present

## 2017-07-05 DIAGNOSIS — I5042 Chronic combined systolic (congestive) and diastolic (congestive) heart failure: Secondary | ICD-10-CM | POA: Diagnosis not present

## 2017-07-05 DIAGNOSIS — E782 Mixed hyperlipidemia: Secondary | ICD-10-CM

## 2017-07-05 DIAGNOSIS — I480 Paroxysmal atrial fibrillation: Secondary | ICD-10-CM | POA: Diagnosis not present

## 2017-07-05 DIAGNOSIS — I440 Atrioventricular block, first degree: Secondary | ICD-10-CM | POA: Diagnosis present

## 2017-07-05 DIAGNOSIS — M109 Gout, unspecified: Secondary | ICD-10-CM | POA: Diagnosis not present

## 2017-07-05 DIAGNOSIS — F419 Anxiety disorder, unspecified: Secondary | ICD-10-CM | POA: Diagnosis present

## 2017-07-05 DIAGNOSIS — G4733 Obstructive sleep apnea (adult) (pediatric): Secondary | ICD-10-CM | POA: Diagnosis not present

## 2017-07-05 DIAGNOSIS — N183 Chronic kidney disease, stage 3 (moderate): Secondary | ICD-10-CM | POA: Diagnosis present

## 2017-07-05 DIAGNOSIS — I739 Peripheral vascular disease, unspecified: Secondary | ICD-10-CM | POA: Diagnosis present

## 2017-07-05 DIAGNOSIS — E861 Hypovolemia: Secondary | ICD-10-CM | POA: Diagnosis not present

## 2017-07-05 DIAGNOSIS — Z7984 Long term (current) use of oral hypoglycemic drugs: Secondary | ICD-10-CM | POA: Diagnosis not present

## 2017-07-05 DIAGNOSIS — I959 Hypotension, unspecified: Secondary | ICD-10-CM | POA: Diagnosis not present

## 2017-07-05 DIAGNOSIS — E1142 Type 2 diabetes mellitus with diabetic polyneuropathy: Secondary | ICD-10-CM | POA: Diagnosis present

## 2017-07-05 DIAGNOSIS — I5022 Chronic systolic (congestive) heart failure: Secondary | ICD-10-CM | POA: Diagnosis not present

## 2017-07-05 DIAGNOSIS — E1122 Type 2 diabetes mellitus with diabetic chronic kidney disease: Secondary | ICD-10-CM | POA: Diagnosis present

## 2017-07-05 DIAGNOSIS — I352 Nonrheumatic aortic (valve) stenosis with insufficiency: Secondary | ICD-10-CM | POA: Diagnosis not present

## 2017-07-05 DIAGNOSIS — Z888 Allergy status to other drugs, medicaments and biological substances status: Secondary | ICD-10-CM

## 2017-07-05 DIAGNOSIS — G709 Myoneural disorder, unspecified: Secondary | ICD-10-CM | POA: Diagnosis present

## 2017-07-05 DIAGNOSIS — N400 Enlarged prostate without lower urinary tract symptoms: Secondary | ICD-10-CM | POA: Diagnosis present

## 2017-07-05 DIAGNOSIS — I5032 Chronic diastolic (congestive) heart failure: Secondary | ICD-10-CM | POA: Diagnosis present

## 2017-07-05 DIAGNOSIS — I10 Essential (primary) hypertension: Secondary | ICD-10-CM

## 2017-07-05 DIAGNOSIS — G4700 Insomnia, unspecified: Secondary | ICD-10-CM | POA: Diagnosis present

## 2017-07-05 DIAGNOSIS — I130 Hypertensive heart and chronic kidney disease with heart failure and stage 1 through stage 4 chronic kidney disease, or unspecified chronic kidney disease: Secondary | ICD-10-CM | POA: Diagnosis present

## 2017-07-05 DIAGNOSIS — D649 Anemia, unspecified: Secondary | ICD-10-CM | POA: Diagnosis not present

## 2017-07-05 DIAGNOSIS — I4892 Unspecified atrial flutter: Secondary | ICD-10-CM | POA: Diagnosis present

## 2017-07-05 DIAGNOSIS — Z6830 Body mass index (BMI) 30.0-30.9, adult: Secondary | ICD-10-CM

## 2017-07-05 DIAGNOSIS — I5043 Acute on chronic combined systolic (congestive) and diastolic (congestive) heart failure: Secondary | ICD-10-CM | POA: Diagnosis not present

## 2017-07-05 DIAGNOSIS — Z79899 Other long term (current) drug therapy: Secondary | ICD-10-CM

## 2017-07-05 DIAGNOSIS — I4891 Unspecified atrial fibrillation: Secondary | ICD-10-CM

## 2017-07-05 DIAGNOSIS — Z87891 Personal history of nicotine dependence: Secondary | ICD-10-CM

## 2017-07-05 DIAGNOSIS — I110 Hypertensive heart disease with heart failure: Secondary | ICD-10-CM | POA: Diagnosis not present

## 2017-07-05 DIAGNOSIS — Z882 Allergy status to sulfonamides status: Secondary | ICD-10-CM

## 2017-07-05 DIAGNOSIS — I429 Cardiomyopathy, unspecified: Secondary | ICD-10-CM | POA: Diagnosis present

## 2017-07-05 DIAGNOSIS — Z791 Long term (current) use of non-steroidal anti-inflammatories (NSAID): Secondary | ICD-10-CM | POA: Diagnosis not present

## 2017-07-05 DIAGNOSIS — E1151 Type 2 diabetes mellitus with diabetic peripheral angiopathy without gangrene: Secondary | ICD-10-CM | POA: Diagnosis present

## 2017-07-05 DIAGNOSIS — Z7901 Long term (current) use of anticoagulants: Secondary | ICD-10-CM

## 2017-07-05 DIAGNOSIS — E785 Hyperlipidemia, unspecified: Secondary | ICD-10-CM | POA: Diagnosis present

## 2017-07-05 DIAGNOSIS — Z7951 Long term (current) use of inhaled steroids: Secondary | ICD-10-CM

## 2017-07-05 HISTORY — DX: Dependence on other enabling machines and devices: Z99.89

## 2017-07-05 HISTORY — DX: Personal history of urinary calculi: Z87.442

## 2017-07-05 HISTORY — DX: Unspecified atrial fibrillation: I48.91

## 2017-07-05 HISTORY — DX: Type 2 diabetes mellitus without complications: E11.9

## 2017-07-05 HISTORY — DX: Obstructive sleep apnea (adult) (pediatric): G47.33

## 2017-07-05 LAB — GLUCOSE, CAPILLARY
Glucose-Capillary: 65 mg/dL (ref 65–99)
Glucose-Capillary: 195 mg/dL — ABNORMAL HIGH (ref 65–99)

## 2017-07-05 LAB — URINALYSIS, ROUTINE W REFLEX MICROSCOPIC
Bilirubin Urine: NEGATIVE
Glucose, UA: NEGATIVE mg/dL
Hgb urine dipstick: NEGATIVE
KETONES UR: NEGATIVE mg/dL
LEUKOCYTES UA: NEGATIVE
NITRITE: NEGATIVE
PROTEIN: NEGATIVE mg/dL
Specific Gravity, Urine: 1.01 (ref 1.005–1.030)
pH: 5.5 (ref 5.0–8.0)

## 2017-07-05 LAB — CBC
HEMATOCRIT: 44.6 % (ref 39.0–52.0)
HEMOGLOBIN: 14.1 g/dL (ref 13.0–17.0)
MCH: 26.2 pg (ref 26.0–34.0)
MCHC: 31.6 g/dL (ref 30.0–36.0)
MCV: 82.9 fL (ref 78.0–100.0)
Platelets: 233 10*3/uL (ref 150–400)
RBC: 5.38 MIL/uL (ref 4.22–5.81)
RDW: 17.2 % — ABNORMAL HIGH (ref 11.5–15.5)
WBC: 5.1 10*3/uL (ref 4.0–10.5)

## 2017-07-05 LAB — BASIC METABOLIC PANEL
ANION GAP: 7 (ref 5–15)
BUN: 33 mg/dL — ABNORMAL HIGH (ref 6–20)
CO2: 24 mmol/L (ref 22–32)
Calcium: 9.8 mg/dL (ref 8.9–10.3)
Chloride: 106 mmol/L (ref 101–111)
Creatinine, Ser: 1.63 mg/dL — ABNORMAL HIGH (ref 0.61–1.24)
GFR, EST AFRICAN AMERICAN: 50 mL/min — AB (ref >60)
GFR, EST NON AFRICAN AMERICAN: 43 mL/min — AB (ref >60)
GLUCOSE: 111 mg/dL — AB (ref 65–99)
Potassium: 4.4 mmol/L (ref 3.5–5.1)
Sodium: 137 mmol/L (ref 135–145)

## 2017-07-05 LAB — PROTIME-INR
INR: 1.98
Prothrombin Time: 22.3 seconds — ABNORMAL HIGH (ref 11.4–15.2)

## 2017-07-05 LAB — TROPONIN I

## 2017-07-05 LAB — MAGNESIUM: MAGNESIUM: 1.9 mg/dL (ref 1.7–2.4)

## 2017-07-05 MED ORDER — SODIUM CHLORIDE 0.9% FLUSH: 3.0000 mL | INTRAVENOUS | Status: DC | PRN

## 2017-07-05 MED ORDER — SODIUM CHLORIDE 0.9% FLUSH
3.0000 mL | Freq: Two times a day (BID) | INTRAVENOUS | Status: DC
  Administered 2017-07-05 – 2017-07-08 (×5): 3 mL via INTRAVENOUS

## 2017-07-05 MED ORDER — ENSURE ENLIVE PO LIQD
237.0000 mL | Freq: Two times a day (BID) | ORAL | Status: DC
  Administered 2017-07-06: 237 mL via ORAL

## 2017-07-05 MED ORDER — ONDANSETRON HCL 4 MG/2ML IJ SOLN: 4.0000 mg | Freq: Four times a day (QID) | INTRAMUSCULAR | Status: DC | PRN

## 2017-07-05 MED ORDER — BENAZEPRIL HCL 40 MG PO TABS
40.0000 mg | ORAL_TABLET | Freq: Every day | ORAL | Status: DC
  Administered 2017-07-06 – 2017-07-07 (×2): 40 mg via ORAL
  Filled 2017-07-05 (×2): qty 1

## 2017-07-05 MED ORDER — INSULIN ASPART 100 UNIT/ML ~~LOC~~ SOLN
0.0000 [IU] | Freq: Three times a day (TID) | SUBCUTANEOUS | Status: DC
  Administered 2017-07-07 (×2): 3 [IU] via SUBCUTANEOUS
  Administered 2017-07-07 – 2017-07-08 (×2): 2 [IU] via SUBCUTANEOUS

## 2017-07-05 MED ORDER — OMEGA-3-ACID ETHYL ESTERS 1 G PO CAPS
1.0000 g | ORAL_CAPSULE | Freq: Every day | ORAL | Status: DC
  Administered 2017-07-07 – 2017-07-08 (×2): 1 g via ORAL
  Filled 2017-07-05 (×3): qty 1

## 2017-07-05 MED ORDER — SODIUM CHLORIDE 0.9 % IV SOLN
INTRAVENOUS | Status: DC
  Administered 2017-07-06 – 2017-07-08 (×2): via INTRAVENOUS
  Administered 2017-07-08: 500 mL via INTRAVENOUS

## 2017-07-05 MED ORDER — ADULT MULTIVITAMIN W/MINERALS CH
1.0000 | ORAL_TABLET | Freq: Every day | ORAL | Status: DC
  Administered 2017-07-07 – 2017-07-08 (×2): 1 via ORAL
  Filled 2017-07-05 (×3): qty 1

## 2017-07-05 MED ORDER — FUROSEMIDE 80 MG PO TABS
80.0000 mg | ORAL_TABLET | Freq: Every day | ORAL | Status: DC
  Administered 2017-07-07 – 2017-07-08 (×2): 80 mg via ORAL
  Filled 2017-07-05 (×2): qty 1

## 2017-07-05 MED ORDER — WARFARIN - PHARMACIST DOSING INPATIENT
Freq: Every day | Status: DC
  Administered 2017-07-06: 17:00:00

## 2017-07-05 MED ORDER — ALLOPURINOL 300 MG PO TABS
300.0000 mg | ORAL_TABLET | Freq: Every day | ORAL | Status: DC
  Administered 2017-07-07 – 2017-07-08 (×2): 300 mg via ORAL
  Filled 2017-07-05 (×4): qty 1

## 2017-07-05 MED ORDER — DILTIAZEM HCL 100 MG IV SOLR
5.0000 mg/h | INTRAVENOUS | Status: DC
  Filled 2017-07-05: qty 100

## 2017-07-05 MED ORDER — MAGNESIUM OXIDE 400 (241.3 MG) MG PO TABS
400.0000 mg | ORAL_TABLET | Freq: Every day | ORAL | Status: DC
  Administered 2017-07-07 – 2017-07-08 (×2): 400 mg via ORAL
  Filled 2017-07-05 (×2): qty 1

## 2017-07-05 MED ORDER — POTASSIUM CHLORIDE CRYS ER 20 MEQ PO TBCR
20.0000 meq | EXTENDED_RELEASE_TABLET | Freq: Every day | ORAL | Status: DC
  Administered 2017-07-07 – 2017-07-08 (×2): 20 meq via ORAL
  Filled 2017-07-05 (×2): qty 1

## 2017-07-05 MED ORDER — ALBUTEROL SULFATE (2.5 MG/3ML) 0.083% IN NEBU: 2.5000 mg | INHALATION_SOLUTION | Freq: Four times a day (QID) | RESPIRATORY_TRACT | Status: DC | PRN

## 2017-07-05 MED ORDER — WARFARIN SODIUM 2 MG PO TABS
4.0000 mg | ORAL_TABLET | Freq: Once | ORAL | Status: AC
  Administered 2017-07-05: 4 mg via ORAL
  Filled 2017-07-05: qty 2

## 2017-07-05 MED ORDER — DOFETILIDE 500 MCG PO CAPS
500.0000 ug | ORAL_CAPSULE | Freq: Two times a day (BID) | ORAL | Status: DC
  Administered 2017-07-05 – 2017-07-08 (×6): 500 ug via ORAL
  Filled 2017-07-05 (×6): qty 1

## 2017-07-05 MED ORDER — TEMAZEPAM 7.5 MG PO CAPS
7.5000 mg | ORAL_CAPSULE | Freq: Every evening | ORAL | Status: DC | PRN
  Administered 2017-07-05 – 2017-07-07 (×3): 7.5 mg via ORAL
  Filled 2017-07-05 (×3): qty 1

## 2017-07-05 MED ORDER — INSULIN ASPART 100 UNIT/ML ~~LOC~~ SOLN: 0.0000 [IU] | Freq: Every day | SUBCUTANEOUS | Status: DC

## 2017-07-05 MED ORDER — DILTIAZEM LOAD VIA INFUSION
10.0000 mg | Freq: Once | INTRAVENOUS | Status: AC
  Administered 2017-07-05: 10 mg via INTRAVENOUS
  Filled 2017-07-05: qty 10

## 2017-07-05 MED ORDER — METOPROLOL SUCCINATE ER 50 MG PO TB24
50.0000 mg | ORAL_TABLET | Freq: Two times a day (BID) | ORAL | Status: DC
  Administered 2017-07-05 – 2017-07-06 (×2): 50 mg via ORAL
  Filled 2017-07-05 (×2): qty 1

## 2017-07-05 MED ORDER — LORAZEPAM 1 MG PO TABS
1.0000 mg | ORAL_TABLET | Freq: Three times a day (TID) | ORAL | Status: DC | PRN
  Administered 2017-07-06 – 2017-07-07 (×3): 1 mg via ORAL
  Filled 2017-07-05 (×3): qty 1

## 2017-07-05 MED ORDER — ACETAMINOPHEN 325 MG PO TABS: 650.0000 mg | ORAL_TABLET | ORAL | Status: DC | PRN

## 2017-07-05 MED ORDER — SODIUM CHLORIDE 0.9 % IV SOLN: 250.0000 mL | INTRAVENOUS | Status: DC

## 2017-07-05 MED ORDER — PRAVASTATIN SODIUM 40 MG PO TABS
40.0000 mg | ORAL_TABLET | Freq: Every day | ORAL | Status: DC
  Administered 2017-07-07 – 2017-07-08 (×2): 40 mg via ORAL
  Filled 2017-07-05 (×3): qty 1

## 2017-07-05 MED ORDER — FUROSEMIDE 40 MG PO TABS
40.0000 mg | ORAL_TABLET | Freq: Every evening | ORAL | Status: DC
  Administered 2017-07-05 – 2017-07-07 (×3): 40 mg via ORAL
  Filled 2017-07-05 (×4): qty 1

## 2017-07-05 MED ORDER — HYDROCODONE-ACETAMINOPHEN 10-325 MG PO TABS: 1.0000 | ORAL_TABLET | Freq: Three times a day (TID) | ORAL | Status: DC | PRN

## 2017-07-05 NOTE — Assessment & Plan Note (Addendum)
Is following with cardiology today with RVR spoke with Dr Debara Pickett and Dr C and we will have him taken to ER her at [facility] for IV Cardizem to slow his rate and then he will require transfer to [facility] for EP study and further care. EKG confirms

## 2017-07-05 NOTE — ED Notes (Signed)
ED Provider at bedside. 

## 2017-07-05 NOTE — Assessment & Plan Note (Signed)
Encouraged heart healthy diet, increase exercise, avoid trans fats, consider a krill oil cap daily 

## 2017-07-05 NOTE — Progress Notes (Signed)
Admitted pt from [place] assisted by the [facility] team per stretcher. Pt is alert oriented x 4, denies chest pain, denies nausea and vomiting, not in respiratory distress. Placed on telemetry box 34 and verified by the NT. Pt was oriented to room and call bell, educated the patient fall safety prevention plan with understanding. MD on call was notified. Will conitnue to monitor pt.   07/05/17 2113  Vitals  Temp (!) 97.4 F (36.3 C)  Temp Source Oral  BP (!) 142/95  BP Location Right Arm  BP Method Automatic  Patient Position (if appropriate) Sitting  Pulse Rate 97  Pulse Rate Source Dinamap  Resp 18  Oxygen Therapy  SpO2 100 %  O2 Device Room Air  Pain Assessment  Pain Assessment No/denies pain  Height and Weight  Height 5\' 11"  (1.803 m)  Weight 96.2 kg (212 lb)  Type of Scale Used Standing (Scale A)  Type of Weight Actual  BSA (Calculated - sq m) 2.19 sq meters  BMI (Calculated) 29.58  Weight in (lb) to have BMI = 25 178.9

## 2017-07-05 NOTE — Assessment & Plan Note (Signed)
Well controlled, no changes to meds. Encouraged heart healthy diet such as the DASH diet and exercise as tolerated.  °

## 2017-07-05 NOTE — Assessment & Plan Note (Signed)
Increase leafy greens, consider increased lean red meat and using cast iron cookware. Continue to monitor, report any concerns 

## 2017-07-05 NOTE — ED Notes (Signed)
Informed Dr. Darene Lamer EDP of Pt. B/P being low and that the cardizem gtt at 5 and the bolus was in.  Dr T aware and reported to RN to inform him of any more BP issues or HR issues.

## 2017-07-05 NOTE — H&P (Signed)
Cardiology Admission History and Physical:   Patient ID: [name]; MRN: [id number]; DOB: Jan 04, 1951   Admission date: 07/05/2017  Primary Care Provider: [name], MD Primary Cardiologist: Sanda Klein, MD  Primary Electrophysiologist:  None  Chief Complaint:  Elevated heart rate  Patient Profile:   [name] is a 67 y.o. male with a history of chronic diastolic heart failure, paroxysmal atrial fibrillation and atrial flutter (s/p cardioversion 06/17/17, on tikosyn and metoprolol), aortic insufficiency, HTN, OSA, dyslipidemia, DM2, who presents today with an elevated heart rate, found to be in Aflutter w RVR.   History of Present Illness:   [name]  is a 67 y.o. male with a history of chronic diastolic heart failure, paroxysmal atrial fibrillation and atrial flutter (s/p cardioversion 06/17/17, on tikosyn, metoprolol, and coumadin), aortic insufficiency, HTN, OSA, dyslipidemia, DM2, who presented to his PMD today for routine follow-up and was found to have an elevated heart rate. EKG confirmed patient to be in atrial flutter with RVR. He was transferred to [facility] and started on a diltiazem gtt for rate control. Patient then transferred to [facility] for further management of atrial flutter/fibrillation.  He states that he had felt normal since his 06/17/17 cardioversion and did not realize he was in atrial flutter (usually feels atrial arrhythmias).  No dyspnea or chest pain.   Patient was last seen outpatient by Dr. Sallyanne Kuster 06/14/17 with complaints of worsening SOB x2 weeks, although without palpitations and patient reported being unable to tell when he's in atrial fibrillation. His rhythm was Afib RVR with a rate of 130 at that visit. He reported compliance with his tikosyn, metoprolol, and warfarin. Decision made to pursue cardioversion for symptomatic atrial fibrillation, which occurred on 06/17/17 with successful conversion to NSR.   ED course: HR 123 on presentation, with  intermittent tachypnea and hypotension. Labs: CBC wnl, Cr 1.63, K 4.4, Mg 1.9. INR 1.98. EKG with Atrial flutter with 2:1 AV block, rate 118. Patient started on a diltiazem gtt with adequate rate response. Transferred to [facility] for further management.   Of note, echo in 1/19 in setting of atrial fibrillation/RVR showed EF 30-35%, diffuse hypokinesis.    Past Medical History:  Diagnosis Date  . AORTIC STENOSIS, MODERATE 12/05/2009  . Atrial fibrillation ([place])   . Atrial flutter ([place]) 12/05/2009  . Atypical chest pain 11/08/2011  . BENIGN PROSTATIC HYPERTROPHY, HX OF 12/05/2009  . CHEST PAIN-UNSPECIFIED 11/11/2009  . CHF 12/05/2009  . Chronic renal insufficiency 12/16/2016  . Debility 04/18/2012  . Diarrhea 12/16/2016  . DM 12/05/2009  . DYSPNEA 12/19/2009  . Edema 04/18/2012  . ERECTILE DYSFUNCTION 01/25/2007  . FATIGUE, CHRONIC 11/11/2009  . Gout 07/10/2012  . Heart murmur   . Hyperkalemia 04/18/2012  . Hyperlipidemia   . Hypertension   . HYPERTENSION 01/25/2007  . Hypoxia 05/05/2011  . Insomnia 05/13/2016  . INSOMNIA, HX OF 01/25/2007  . Knee pain, bilateral 12/28/2010  . Knee pain, right 12/28/2010  . NEUROMA 01/05/2010  . OBESITY NOS 01/25/2007  . OSA (obstructive sleep apnea) 07/13/2014  . PERIPHERAL NEUROPATHY 01/05/2010  . PULMONARY FUNCTION TESTS, ABNORMAL 02/02/2010  . SLEEP APNEA 04/06/2010   uses cpap  . Superficial thrombophlebitis of left leg 09/07/2011  . TESTICULAR HYPOFUNCTION 01/05/2010  . 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 Toe pain, right 07/10/2012  . UNSPECIFIED ANEMIA 01/05/2010    Past Surgical History:  Procedure Laterality Date  . bilat foot surgery removed part of 5th metatarsal to corect curvature of toes    .  CARDIAC CATHETERIZATION  10/16/2009   nonischemic cardiomyopathy  . CARDIOVERSION N/A 03/25/2015   Procedure: CARDIOVERSION;  Surgeon: Pixie Casino, MD;  Location: [facility] ENDOSCOPY;  Service: Cardiovascular;  Laterality: N/A;  . CARDIOVERSION N/A 06/17/2017   Procedure: CARDIOVERSION;   Surgeon: Sanda Klein, MD;  Location: [facility] ENDOSCOPY;  Service: Cardiovascular;  Laterality: N/A;     Medications Prior to Admission: Prior to Admission medications   Medication Sig Start Date End Date Taking? Authorizing Provider  albuterol (VENTOLIN HFA) 108 (90 Base) MCG/ACT inhaler Inhale 2 puffs into the lungs every 6 (six) hours as needed for wheezing or shortness of breath. 12/16/16   [name], MD  allopurinol (ZYLOPRIM) 300 MG tablet TAKE ONE TABLET BY MOUTH ONCE DAILY 01/13/17   [name], MD  benazepril (LOTENSIN) 40 MG tablet Take 1 tablet (40 mg total) by mouth daily. 12/16/16   [name], MD  calcium carbonate (TUMS - DOSED IN MG ELEMENTAL CALCIUM) 500 MG chewable tablet Chew 1 tablet by mouth daily as needed. For heartburn    [provider]  cetirizine (ZYRTEC) 10 MG tablet Take 1 tablet (10 mg total) by mouth daily. 11/01/14   [name], MD  Clobetasol Prop Emollient Base (CLOBETASOL PROPIONATE E) 0.05 % emollient cream Apply 1 application topically 2 (two) times daily as needed (insect bite). 12/16/16   [name], MD  colchicine (COLCRYS) 0.6 MG tablet TAKE ONE TABLET BY MOUTH DAILY AS NEEDED FOR GOUT FLARE UPS. 01/14/16   Croitoru, Mihai, MD  dofetilide (TIKOSYN) 500 MCG capsule Take 1 capsule (500 mcg total) by mouth 2 (two) times daily. 04/06/17   Croitoru, Mihai, MD  fish oil-omega-3 fatty acids 1000 MG capsule Take 1 g by mouth daily.     [provider]  furosemide (LASIX) 40 MG tablet TAKE TWO TABLETS BY MOUTH EVERY MORNING AND TAKE ONE TABLET BY MOUTH EVERY AFTERNOON 05/19/17   Croitoru, Mihai, MD  glyBURIDE (DIABETA) 5 MG tablet TAKE 1 TABLET BY MOUTH ONCE DAILY WITH BREAKFAST 09/20/16   [name], MD  HYDROcodone-acetaminophen (NORCO) 10-325 MG tablet Take 1 tablet by mouth every 8 (eight) hours as needed for moderate pain or severe pain. 12/16/16   [name], MD  KLOR-CON M20 20 MEQ tablet TAKE ONE TABLET BY MOUTH ONCE  DAILY AND  EXTRA  TABLET  IF  WEIGHT  IS  ABOVE  210  LBS 12/27/16   Croitoru, Mihai, MD  LORazepam (ATIVAN) 1 MG tablet Take 1 tablet (1 mg total) by mouth every 8 (eight) hours as needed for anxiety. 08/12/16   [name], MD  magnesium oxide (MAG-OX) 400 MG tablet Take 1 tablet (400 mg total) by mouth daily. 04/18/15   Barrett, Evelene Croon, PA-C  metFORMIN (GLUCOPHAGE) 500 MG tablet TAKE 1 TABLET BY MOUTH THREE TIMES DAILY 05/13/17   [name], MD  metoprolol tartrate (LOPRESSOR) 100 MG tablet Take 1.5 tablets (150 mg total) by mouth 2 (two) times daily. 06/17/17   Croitoru, Mihai, MD  Multiple Vitamins-Minerals (MULTIVITAMIN PO) Take 1 tablet by mouth daily.    [provider]  mupirocin ointment (BACTROBAN) 2 % Place 1 application into the nose 2 (two) times daily. 09/17/14   [name], MD  naproxen (NAPROSYN) 375 MG tablet Take 1 tablet (375 mg total) by mouth daily. 05/13/16   [name], MD  pravastatin (PRAVACHOL) 40 MG tablet TAKE ONE TABLET BY MOUTH ONCE DAILY 08/30/16  Troy Sine, MD  temazepam (RESTORIL) 30 MG capsule TAKE 1 CAPSULE BY MOUTH AT BEDTIME AS NEEDED FOR SLEEP 01/16/17   [name], MD  Testosterone 10 MG/ACT (2%) GEL Apply 1 application topically daily. Use as directed 04/29/14   [provider]  warfarin (COUMADIN) 4 MG tablet TAKE 1/2 TO 1 (ONE-HALF TO ONE) TABLET BY MOUTH DAILY OR AS DIRECTED BY COUMADIN CLINIC 02/14/17   Croitoru, Dani Gobble, MD     Allergies:    Allergies  Allergen Reactions  . Sulfonamide Derivatives Other (See Comments)    unknown reaction.  Patient states had to go to the ER.   [name] Zolpidem Other (See Comments)    Excessive, prolonged sedation    Social History:   Social History   Socioeconomic History  . Marital status: Married    Spouse name: Not on file  . Number of children: Not on file  . Years of education: Not on file  . Highest education level: Not on file  Social Needs  . Financial resource  strain: Not on file  . Food insecurity - worry: Not on file  . Food insecurity - inability: Not on file  . Transportation needs - medical: Not on file  . Transportation needs - non-medical: Not on file  Occupational History  . Not on file  Tobacco Use  . Smoking status: Former Smoker    Packs/day: 1.00    Years: 10.00    Pack years: 10.00    Last attempt to quit: 06/07/1980    Years since quitting: 37.1  . Smokeless tobacco: Never Used  Substance and Sexual Activity  . Alcohol use: No  . Drug use: No  . Sexual activity: Not Currently  Other Topics Concern  . Not on file  Social History Narrative  . Not on file    Family History:   The patient's family history includes Aneurysm in his sister; Cancer in his father; Clotting disorder in his mother; Diabetes in his mother and sister; Heart attack in his mother; Heart disease in his mother; Hyperlipidemia in his mother; Hypertension in his mother and sister; Leukemia in his maternal grandmother; Lung disease in his father; Other in his sister; Seizures in his sister; Stroke in his mother.    ROS:  All systems reviewed and negative except as per HPI.    Physical Exam/Data:   Vitals:   07/05/17 1708 07/05/17 1812 07/05/17 1815 07/05/17 1823  BP: (!) 112/91 95/70 (!) 86/58 (!) 88/58  Pulse: (!) 129 89 87   Resp: (!) 30 (!) 24 (!) 21   Temp:      TempSrc:      SpO2: 98% 96% 95%   Weight:      Height:       No intake or output data in the 24 hours ending 07/05/17 1830 Filed Weights   07/05/17 1600  Weight: 219 lb (99.3 kg)   Body mass index is 30.54 kg/m.  General:  NAD HEENT: normal Lymph: no adenopathy Neck: JVP 8 cm Endocrine:  No thryomegaly Vascular: No carotid bruits; FA pulses 2+ bilaterally without bruits  Cardiac:  normal S1, S2; mildly tachy, irregular; no murmur/S3/S4 Lungs:  clear to auscultation bilaterally, no wheezing, rhonchi or rales  Abd: soft, nontender, no hepatomegaly  Ext: no  edema Musculoskeletal:  No deformities, BUE and BLE strength normal and equal Skin: warm and dry  Neuro:  CNs 2-12 intact, no focal abnormalities noted Psych:  Normal affect  EKG:  The ECG that was personally reviewed and demonstrates atrial flutter with rate 118  Relevant CV Studies:  Echocardiogram 06/16/17: Study Conclusions  - Left ventricle: The cavity size was normal. There was moderate   concentric hypertrophy. Systolic function was moderately to   severely reduced. The estimated ejection fraction was in the   range of 30% to 35%. Severe diffuse hypokinesis with distinct   regional wall motion abnormalities. There is akinesis of the   apical myocardium. The study is not technically sufficient to   allow evaluation of LV diastolic function. - Aortic valve: There was mild to moderate regurgitation directed   eccentrically in the LVOT and towards the mitral anterior   leaflet. - Aorta: Aortic root dimension: 40 mm (ED). Ascending aortic   diameter: 39 mm (S). - Aortic root: The aortic root was mildly dilated. - Ascending aorta: The ascending aorta was mildly dilated. - Mitral valve: Calcified annulus. Mildly thickened leaflets .   There was moderate regurgitation directed eccentrically and   posteriorly. - Left atrium: The atrium was mildly dilated. - Right ventricle: The cavity size was mildly dilated. Wall   thickness was normal. - Right atrium: The atrium was mildly dilated. - Pulmonary arteries: PA peak pressure: 43 mm Hg (S).  Impressions:  - Compared to prior echo, LVF has significantly declined with GLLS   of -7%, mild RVE, mild RAE, MAC with mild MR, AVSC, moderate LVH,   dilated aortic root, moderate pulmonary HTN. The right   ventricular systolic pressure was increased consistent with   moderate pulmonary hypertension.  Laboratory Data:  Chemistry Recent Labs  Lab 07/05/17 1651  NA 137  K 4.4  CL 106  CO2 24  GLUCOSE 111*  BUN 33*  CREATININE  1.63*  CALCIUM 9.8  GFRNONAA 43*  GFRAA 50*  ANIONGAP 7    No results for input(s): PROT, ALBUMIN, AST, ALT, ALKPHOS, BILITOT in the last 168 hours. Hematology Recent Labs  Lab 07/05/17 1651  WBC 5.1  RBC 5.38  HGB 14.1  HCT 44.6  MCV 82.9  MCH 26.2  MCHC 31.6  RDW 17.2*  PLT 233   Cardiac Enzymes Recent Labs  Lab 07/05/17 1651  TROPONINI <0.03   No results for input(s): TROPIPOC in the last 168 hours.  BNPNo results for input(s): BNP, PROBNP in the last 168 hours.  DDimer No results for input(s): DDIMER in the last 168 hours.  Radiology/Studies:  Dg Chest 2 View  Result Date: 07/05/2017 CLINICAL DATA:  Atrial fibrillation EXAM: CHEST  2 VIEW COMPARISON:  March 20, 2013 FINDINGS: The heart size and mediastinal contours are stable. The heart size is enlarged. There is no focal infiltrate, pulmonary edema, or pleural effusion. The visualized skeletal structures are unremarkable. IMPRESSION: No active cardiopulmonary disease.  Cardiomegaly. Electronically Signed   By: Abelardo Diesel M.D.   On: 07/05/2017 18:01    Assessment and Plan:   1. Paroxysmal Atrial flutter/ fibrillation: s/p cardioversion 06/17/17 with successful conversion to NSR. Unfortunately upon presentation to PMD today for routine follow-up he was found to be in atrial flutter w RVR. Transferred to [facility] with initiation of diltiazem gtt, ultimately transferred to [facility] for further management.  - This patients CHA2DS2-VASc Score and unadjusted Ischemic Stroke Rate (% per year) is equal to 3.2 % stroke rate/year from a score of 3 Above score calculated as 1 point each if present [CHF, HTN, DM, Vascular=MI/PAD/Aortic Plaque, Age if 65-74, or Male] Above score calculated as 2 points  each if present [Age > 75, or Stroke/TIA/TE] - Continue coumadin per pharmacy - INR 1.98 today (goal 2-3) - Continue tikosyn and metoprolol - Monitor QT interval closely  - Will ask EP to evaluate in AM - patient may benefit from  additional intervention vs. alternative drug therapy.  2. Chronic systlic heart failure: EF 30-35% on 1/19 echo in setting of afib with RVR.  - Continue home metoprolol, benazepril, and lasix  3. AI: Mild to moderate on last echo.   4. HTN:  5. CKD: Stage 3, stable today.   For questions or updates, please contact [place] Please consult [email or web address] for contact info under Cardiology/STEMI.   Signed, Abigail Butts, PA-C  07/05/2017 6:30 PM   Patient seen with PA, agree with the above note.  Patient recently had DCCV for atrial fibrillation, now with recurrence of atrial flutter.  Patient did not feel the arrhythmia, was noted during visit with PCP today and sent to ER.  In ER, was in atrial flutter with RVR.  Currently, HR in 110s, atrial flutter.  BP stable.  No chest pain or dyspnea.    On exam, irregular and mildly tachycardic rhythm.  JVP 8 cm, no peripheral edema.   1. Atrial flutter/fibrillation: Patient has had history of both fibrillation and flutter.  Had DCCV for atrial fibrillation earlier this month.  Usually symptomatic with atrial arrhythmias but not this time.  Sent to ER by PCP when found at routine appt.  - Would continue home Tikosyn. QTc hard to assess with atrial flutter.  - Toprol XL 50 mg po bid, give dose now.  - Avoid diltiazem with decreased EF.   - Ultimately, would like benefit from atrial fibrillation and atrial flutter ablation.  - INR < 2, continue coumadin.  Will try to arrange for TEE-guided DCCV tomorrow.  If INR < 2 in am will need to start heparin gtt prior to TEE-DCCV.  2. Chronic systolic CHF: Echo in 1/32 in setting of afib/RVR with EF 30-35%.  ?tachy-mediated CMP.  Need to keep in NSR and recheck EF.  No more than mild volume overload on exam.  - Continue home Lasix.  - Continue benazepril 40 mg daily.  - Stop metoprolol tartrate and start Toprol XL as above.  3. CKD: Stage 3, appears stable compared to the past.  4. Aortic  insufficiency: mild to moderate on last echo.   Loralie Champagne 07/05/2017 10:06 PM

## 2017-07-05 NOTE — Progress Notes (Signed)
ANTICOAGULATION CONSULT NOTE - Initial Consult  Pharmacy Consult for warfarin Indication: atrial fibrillation  Allergies  Allergen Reactions  . Sulfonamide Derivatives Other (See Comments)    unknown reaction.  Patient states had to go to the ER.   [name] Zolpidem Other (See Comments)    Excessive, prolonged sedation    Patient Measurements: Height: 5\' 11"  (180.3 cm) Weight: 212 lb (96.2 kg) IBW/kg (Calculated) : 75.3  Vital Signs: Temp: 97.4 F (36.3 C) (01/29 2113) Temp Source: Oral (01/29 2113) BP: 142/95 (01/29 2113) Pulse Rate: 97 (01/29 2113)  Labs: Recent Labs    07/05/17 1651  HGB 14.1  HCT 44.6  PLT 233  LABPROT 22.3*  INR 1.98  CREATININE 1.63*  TROPONINI <0.03    Estimated Creatinine Clearance: 54.2 mL/min (A) (by C-G formula based on SCr of 1.63 mg/dL (H)).  Assessment: CC/HPI: 67 yo f presenting with tachycardia  PMH: dCHF, afib on warfarin, HTN, OSA, DM2  Anticoag: warfarin pta for afib - admit INR 1.98 - last dose 1/28 per pt  PTA dose from 1/8 - 4 mg MWF, 2 mg AOD  CV: tikosyn pta   Renal: SCr 1.63  Heme/Onc: H&H 14.1/44.6, Plt 233  Goal of Therapy:  INR 2-3 Monitor platelets by anticoagulation protocol: Yes   Plan:  Warfarin 4 mg x 1 (will give higher dose since INR <2) Daily INR Hep gtt needed in am in INR still < 2? F/U Plans for DCCV Wed Am  Levester Fresh, PharmD, BCPS, BCCCP Clinical Pharmacist Clinical phone for 07/05/2017 from 1430 [phone]: [phone] If after 2300, please call main pharmacy at: [phone] 07/05/2017 10:26 PM

## 2017-07-05 NOTE — ED Notes (Signed)
EKG Completed by RN Marylynn Pearson

## 2017-07-05 NOTE — ED Triage Notes (Signed)
Patient is brought down from an MD appointment upstairs with Rapid AFIB with RVR - patient denies any palpitations or chest pain - denies any SOB

## 2017-07-05 NOTE — Telephone Encounter (Signed)
Received a call as the office DOD today from Dr. Randel Pigg.  [name] was seen in the office and is found to be in A. fib with RVR.  He underwent cardioversion 2 weeks ago by Dr. Loletha Grayer and is on tikosyn 500 mcg BID and metoprolol 150 mg BID.  He is symptomatic with his A. fib.  I discussed the case with Dr. Loletha Grayer and feel that he will need to be evaluated by electrophysiology for possible ablation or alternate antiarrhythmic therapy.  He will need to be better rate control.  Since Dr. Maryellen Pile is at [facility], I would recommend he be seen in the emergency department there and rate controlled and then could be transferred to [facility] for EP evaluation.  Pixie Casino, MD, [facility], [place] Director of the Advanced Lipid Disorders &  Cardiovascular Risk Reduction Clinic Diplomate of the American Board of Clinical Lipidology Attending Cardiologist  Direct Dial: [phone]  Fax: [phone]  Website:  [email or web address]

## 2017-07-05 NOTE — Assessment & Plan Note (Signed)
hgba1c acceptable, minimize simple carbs. Increase exercise as tolerated.  

## 2017-07-05 NOTE — Progress Notes (Signed)
12 L EKG done showing Afib with RVR, HR was 111. Will continue to monitor pt.

## 2017-07-05 NOTE — ED Notes (Signed)
Patient denies pain and is resting comfortably.  

## 2017-07-05 NOTE — Progress Notes (Signed)
Seen and examined by Dr. Aundra Dubin.

## 2017-07-05 NOTE — ED Provider Notes (Signed)
[facility] EMERGENCY DEPARTMENT Provider Note   CSN: [id number] Arrival date & time: 07/05/17  1554     History   Chief Complaint Chief Complaint  Patient presents with  . Tachycardia    HPI [name] is a 67 y.o. male.  The history is provided by the patient, medical records and a caregiver. No language interpreter was used.  Illness  This is a new problem. Episode onset: unknonwn. The problem occurs rarely. The problem has not changed since onset.Pertinent negatives include no chest pain, no abdominal pain, no headaches and no shortness of breath. Nothing aggravates the symptoms. Nothing relieves the symptoms. He has tried nothing for the symptoms. The treatment provided no relief.    Past Medical History:  Diagnosis Date  . AORTIC STENOSIS, MODERATE 12/05/2009  . Atrial fibrillation ([place])   . Atrial flutter ([place]) 12/05/2009  . Atypical chest pain 11/08/2011  . BENIGN PROSTATIC HYPERTROPHY, HX OF 12/05/2009  . CHEST PAIN-UNSPECIFIED 11/11/2009  . CHF 12/05/2009  . Chronic renal insufficiency 12/16/2016  . Debility 04/18/2012  . Diarrhea 12/16/2016  . DM 12/05/2009  . DYSPNEA 12/19/2009  . Edema 04/18/2012  . ERECTILE DYSFUNCTION 01/25/2007  . FATIGUE, CHRONIC 11/11/2009  . Gout 07/10/2012  . Heart murmur   . Hyperkalemia 04/18/2012  . Hyperlipidemia   . Hypertension   . HYPERTENSION 01/25/2007  . Hypoxia 05/05/2011  . Insomnia 05/13/2016  . INSOMNIA, HX OF 01/25/2007  . Knee pain, bilateral 12/28/2010  . Knee pain, right 12/28/2010  . NEUROMA 01/05/2010  . OBESITY NOS 01/25/2007  . OSA (obstructive sleep apnea) 07/13/2014  . PERIPHERAL NEUROPATHY 01/05/2010  . PULMONARY FUNCTION TESTS, ABNORMAL 02/02/2010  . SLEEP APNEA 04/06/2010   uses cpap  . Superficial thrombophlebitis of left leg 09/07/2011  . TESTICULAR HYPOFUNCTION 01/05/2010  . 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 Toe pain, right 07/10/2012  . UNSPECIFIED ANEMIA 01/05/2010    Patient Active Problem List   Diagnosis Date  Noted  . Diarrhea 12/16/2016  . Encounter for monitoring dofetilide therapy 11/10/2016  . Insomnia 05/13/2016  . Decreased visual acuity 02/29/2016  . Essential hypertension, malignant 12/11/2015  . Pigmented skin lesions 01/10/2015  . Dermatitis 09/23/2014  . OSA (obstructive sleep apnea) 07/13/2014  . Medicare annual wellness visit, subsequent 06/23/2014  . Anxiety state, unspecified 08/17/2013  . Moderate aortic insufficiency 02/18/2013  . Long term (current) use of anticoagulants 08/22/2012  . Gout 07/10/2012  . Hyperkalemia 04/18/2012  . Edema 04/18/2012  . Debility 04/18/2012  . 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 Superficial thrombophlebitis of left leg 09/07/2011  . A-fib ([place]) 08/02/2011  . Hypoxia 05/05/2011  . Knee pain, bilateral 12/28/2010  . Hyperlipidemia 10/13/2010  . NEUROMA 01/05/2010  . TESTICULAR HYPOFUNCTION 01/05/2010  . Anemia 01/05/2010  . PERIPHERAL NEUROPATHY 01/05/2010  . Diabetes mellitus type 2 in obese ([place]) 12/05/2009  . Chronic diastolic congestive heart failure ([place]) 12/05/2009  . BPH (benign prostatic hyperplasia) 12/05/2009  . Fatigue 11/11/2009  . CHEST PAIN-UNSPECIFIED 11/11/2009  . Obesity 01/25/2007  . ERECTILE DYSFUNCTION 01/25/2007  . History of other specified conditions presenting hazards to health 01/25/2007    Past Surgical History:  Procedure Laterality Date  . bilat foot surgery removed part of 5th metatarsal to corect curvature of toes    . CARDIAC CATHETERIZATION  10/16/2009   nonischemic cardiomyopathy  . CARDIOVERSION N/A 03/25/2015   Procedure: CARDIOVERSION;  Surgeon: Pixie Casino, MD;  Location: [facility];  Service: Cardiovascular;  Laterality: N/A;  . CARDIOVERSION  N/A 06/17/2017   Procedure: CARDIOVERSION;  Surgeon: Sanda Klein, MD;  Location: [facility];  Service: Cardiovascular;  Laterality: N/A;       Home Medications    Prior to Admission medications   Medication Sig Start Date End Date Taking?  Authorizing Provider  albuterol (VENTOLIN HFA) 108 (90 Base) MCG/ACT inhaler Inhale 2 puffs into the lungs every 6 (six) hours as needed for wheezing or shortness of breath. 12/16/16   [name], MD  allopurinol (ZYLOPRIM) 300 MG tablet TAKE ONE TABLET BY MOUTH ONCE DAILY 01/13/17   [name], MD  benazepril (LOTENSIN) 40 MG tablet Take 1 tablet (40 mg total) by mouth daily. 12/16/16   [name], MD  calcium carbonate (TUMS - DOSED IN MG ELEMENTAL CALCIUM) 500 MG chewable tablet Chew 1 tablet by mouth daily as needed. For heartburn    [provider]  cetirizine (ZYRTEC) 10 MG tablet Take 1 tablet (10 mg total) by mouth daily. 11/01/14   [name], MD  Clobetasol Prop Emollient Base (CLOBETASOL PROPIONATE E) 0.05 % emollient cream Apply 1 application topically 2 (two) times daily as needed (insect bite). 12/16/16   [name], MD  colchicine (COLCRYS) 0.6 MG tablet TAKE ONE TABLET BY MOUTH DAILY AS NEEDED FOR GOUT FLARE UPS. 01/14/16   Croitoru, Mihai, MD  dofetilide (TIKOSYN) 500 MCG capsule Take 1 capsule (500 mcg total) by mouth 2 (two) times daily. 04/06/17   Croitoru, Mihai, MD  fish oil-omega-3 fatty acids 1000 MG capsule Take 1 g by mouth daily.     [provider]  furosemide (LASIX) 40 MG tablet TAKE TWO TABLETS BY MOUTH EVERY MORNING AND TAKE ONE TABLET BY MOUTH EVERY AFTERNOON 05/19/17   Croitoru, Mihai, MD  glyBURIDE (DIABETA) 5 MG tablet TAKE 1 TABLET BY MOUTH ONCE DAILY WITH BREAKFAST 09/20/16   [name], MD  HYDROcodone-acetaminophen (NORCO) 10-325 MG tablet Take 1 tablet by mouth every 8 (eight) hours as needed for moderate pain or severe pain. 12/16/16   [name], MD  KLOR-CON M20 20 MEQ tablet TAKE ONE TABLET BY MOUTH ONCE DAILY AND  EXTRA  TABLET  IF  WEIGHT  IS  ABOVE  210  LBS 12/27/16   Croitoru, Mihai, MD  LORazepam (ATIVAN) 1 MG tablet Take 1 tablet (1 mg total) by mouth every 8 (eight) hours as needed for anxiety. 08/12/16    [name], MD  magnesium oxide (MAG-OX) 400 MG tablet Take 1 tablet (400 mg total) by mouth daily. 04/18/15   Barrett, Evelene Croon, PA-C  metFORMIN (GLUCOPHAGE) 500 MG tablet TAKE 1 TABLET BY MOUTH THREE TIMES DAILY 05/13/17   [name], MD  metoprolol tartrate (LOPRESSOR) 100 MG tablet Take 1.5 tablets (150 mg total) by mouth 2 (two) times daily. 06/17/17   Croitoru, Mihai, MD  Multiple Vitamins-Minerals (MULTIVITAMIN PO) Take 1 tablet by mouth daily.    [provider]  mupirocin ointment (BACTROBAN) 2 % Place 1 application into the nose 2 (two) times daily. 09/17/14   [name], MD  naproxen (NAPROSYN) 375 MG tablet Take 1 tablet (375 mg total) by mouth daily. 05/13/16   [name], MD  pravastatin (PRAVACHOL) 40 MG tablet TAKE ONE TABLET BY MOUTH ONCE DAILY 08/30/16   Troy Sine, MD  temazepam (RESTORIL) 30 MG capsule TAKE 1 CAPSULE BY MOUTH AT BEDTIME AS NEEDED FOR SLEEP 01/16/17   [name], MD  Testosterone 10 MG/ACT (  2%) GEL Apply 1 application topically daily. Use as directed 04/29/14   [provider]  warfarin (COUMADIN) 4 MG tablet TAKE 1/2 TO 1 (ONE-HALF TO ONE) TABLET BY MOUTH DAILY OR AS DIRECTED BY COUMADIN CLINIC 02/14/17   Croitoru, Dani Gobble, MD    Family History Family History  Problem Relation Age of Onset  . Clotting disorder Mother   . Heart disease Mother        s/p MI  . Heart attack Mother   . Hypertension Mother   . Diabetes Mother   . Hyperlipidemia Mother   . Stroke Mother   . Cancer Father        ? lung  . Lung disease Father        smoker  . Diabetes Sister   . Hypertension Sister        smoker  . Leukemia Maternal Grandmother        ?  [name] Aneurysm Sister        brain  . Other Sister        clipped  . Seizures Sister        d/o w/aneurysm/ smoker    Social History Social History   Tobacco Use  . Smoking status: Former Smoker    Packs/day: 1.00    Years: 10.00    Pack years: 10.00    Last attempt to  quit: 06/07/1980    Years since quitting: 37.1  . Smokeless tobacco: Never Used  Substance Use Topics  . Alcohol use: No  . Drug use: No     Allergies   Sulfonamide derivatives and Zolpidem   Review of Systems Review of Systems  Constitutional: Negative for chills, diaphoresis, fatigue and fever.  HENT: Negative for congestion.   Eyes: Negative for visual disturbance.  Respiratory: Negative for cough, chest tightness, shortness of breath and wheezing.   Cardiovascular: Negative for chest pain, palpitations and leg swelling.  Gastrointestinal: Negative for abdominal pain, diarrhea, nausea and vomiting.  Genitourinary: Negative for dysuria.  Musculoskeletal: Negative for back pain, neck pain and neck stiffness.  Skin: Negative for rash and wound.  Neurological: Negative for light-headedness and headaches.  Psychiatric/Behavioral: Negative for agitation.  All other systems reviewed and are negative.    Physical Exam Updated Vital Signs BP (!) 115/50 (BP Location: Left Arm)   Pulse (!) 123   Temp 97.7 F (36.5 C) (Oral)   Resp 18   Ht 5\' 11"  (1.803 m)   Wt 99.3 kg (219 lb)   SpO2 99%   BMI 30.54 kg/m   Physical Exam  Constitutional: He appears well-developed and well-nourished. No distress.  HENT:  Head: Normocephalic and atraumatic.  Mouth/Throat: Oropharynx is clear and moist.  Eyes: Conjunctivae and EOM are normal.  Neck: Normal range of motion. Neck supple.  Cardiovascular: An irregularly irregular rhythm present. Tachycardia present.  No murmur heard. Pulmonary/Chest: Effort normal and breath sounds normal. No respiratory distress. He has no wheezes. He has no rales. He exhibits no tenderness.  Abdominal: Soft. There is no tenderness. There is no rebound.  Musculoskeletal: He exhibits no edema or tenderness.  Neurological: He is alert. No sensory deficit. He exhibits normal muscle tone.  Skin: Skin is warm and dry. Capillary refill takes less than 2 seconds. He  is not diaphoretic. No pallor.  Psychiatric: He has a normal mood and affect.  Nursing note and vitals reviewed.    ED Treatments / Results  Labs (all labs ordered are listed, but only abnormal  results are displayed) Labs Reviewed  BASIC METABOLIC PANEL - Abnormal; Notable for the following components:      Result Value   Glucose, Bld 111 (*)    BUN 33 (*)    Creatinine, Ser 1.63 (*)    GFR calc non Af Amer 43 (*)    GFR calc Af Amer 50 (*)    All other components within normal limits  CBC - Abnormal; Notable for the following components:   RDW 17.2 (*)    All other components within normal limits  PROTIME-INR - Abnormal; Notable for the following components:   Prothrombin Time 22.3 (*)    All other components within normal limits  GLUCOSE, CAPILLARY - Abnormal; Notable for the following components:   Glucose-Capillary 195 (*)    All other components within normal limits  URINE CULTURE  URINALYSIS, ROUTINE W REFLEX MICROSCOPIC  TROPONIN I  MAGNESIUM  GLUCOSE, CAPILLARY  HIV ANTIBODY (ROUTINE TESTING)  BASIC METABOLIC PANEL  CBC  PROTIME-INR    EKG  EKG Interpretation  Date/Time:  Tuesday July 05 2017 16:13:58 EST Ventricular Rate:  118 PR Interval:    QRS Duration: 102 QT Interval:  368 QTC Calculation: 516 R Axis:   56 Text Interpretation:  Atrial flutter with predominant 2:1 AV block Low voltage, extremity leads Borderline ST depression, diffuse leads Prolonged QT interval When compared to prior, now in a flutter with RVR No STEMI Confirmed by Antony Blackbird [phone]) on 07/05/2017 4:48:00 PM       Radiology Dg Chest 2 View  Result Date: 07/05/2017 CLINICAL DATA:  Atrial fibrillation EXAM: CHEST  2 VIEW COMPARISON:  March 20, 2013 FINDINGS: The heart size and mediastinal contours are stable. The heart size is enlarged. There is no focal infiltrate, pulmonary edema, or pleural effusion. The visualized skeletal structures are unremarkable. IMPRESSION: No  active cardiopulmonary disease.  Cardiomegaly. Electronically Signed   By: Abelardo Diesel M.D.   On: 07/05/2017 18:01    Procedures Procedures (including critical care time)  CRITICAL CARE Performed by: Gwenyth Allegra Jarita Raval Total critical care time: 36 minutes Critical care time was exclusive of separately billable procedures and treating other patients. Critical care was necessary to treat or prevent imminent or life-threatening deterioration. Critical care was time spent personally by me on the following activities: development of treatment plan with patient and/or surrogate as well as nursing, discussions with consultants, evaluation of patient's response to treatment, examination of patient, obtaining history from patient or surrogate, ordering and performing treatments and interventions, ordering and review of laboratory studies, ordering and review of radiographic studies, pulse oximetry and re-evaluation of patient's condition.   Medications Ordered in ED Medications  feeding supplement (ENSURE ENLIVE) (ENSURE ENLIVE) liquid 237 mL (not administered)  albuterol (PROVENTIL) (2.5 MG/3ML) 0.083% nebulizer solution 2.5 mg (not administered)  allopurinol (ZYLOPRIM) tablet 300 mg (not administered)  benazepril (LOTENSIN) tablet 40 mg (not administered)  dofetilide (TIKOSYN) capsule 500 mcg (500 mcg Oral Given 07/05/17 2305)  omega-3 acid ethyl esters (LOVAZA) capsule 1 g (not administered)  furosemide (LASIX) tablet 80 mg (not administered)  HYDROcodone-acetaminophen (NORCO) 10-325 MG per tablet 1 tablet (not administered)  potassium chloride SA (K-DUR,KLOR-CON) CR tablet 20 mEq (not administered)  LORazepam (ATIVAN) tablet 1 mg (1 mg Oral Given 07/06/17 0111)  magnesium oxide (MAG-OX) tablet 400 mg (not administered)  multivitamin with minerals tablet 1 tablet (not administered)  pravastatin (PRAVACHOL) tablet 40 mg (not administered)  temazepam (RESTORIL) capsule 7.5 mg (7.5 mg Oral  Given  07/05/17 2303)  acetaminophen (TYLENOL) tablet 650 mg (not administered)  ondansetron (ZOFRAN) injection 4 mg (not administered)  metoprolol succinate (TOPROL-XL) 24 hr tablet 50 mg (50 mg Oral Given 07/05/17 2304)  furosemide (LASIX) tablet 40 mg (40 mg Oral Given 07/05/17 2304)  insulin aspart (novoLOG) injection 0-15 Units (not administered)  insulin aspart (novoLOG) injection 0-5 Units (0 Units Subcutaneous Not Given 07/05/17 2334)  0.9 %  sodium chloride infusion (not administered)  sodium chloride flush (NS) 0.9 % injection 3 mL (3 mLs Intravenous Given 07/05/17 2303)  sodium chloride flush (NS) 0.9 % injection 3 mL (not administered)  0.9 %  sodium chloride infusion (250 mLs Intravenous Not Given 07/06/17 0011)  Warfarin - Pharmacist Dosing Inpatient (not administered)  diltiazem (CARDIZEM) 1 mg/mL load via infusion 10 mg (10 mg Intravenous Bolus from Bag 07/05/17 1751)  warfarin (COUMADIN) tablet 4 mg (4 mg Oral Given 07/05/17 2305)     Initial Impression / Assessment and Plan / ED Course  I have reviewed the triage vital signs and the nursing notes.  Pertinent labs & imaging results that were available during my care of the patient were reviewed by me and considered in my medical decision making (see chart for details).     [name] is a 67 y.o. male with a past medical history significant for diabetes, atrial fibrillation on Coumadin therapy, CKD, sleep apnea, and CHF who was sent down from her PCP office upstairs for further management of A. fib with RVR.  According to patient, he was discharged 2 weeks ago from the hospital after a cardioversion for A. fib.  He had a follow-up appointment today with his PCP and was found to be again in A. fib/flutter with RVR with a rate in the 130s.  Patient overall says she is having no chest pain, shortness of breath, palpitations.  He denies severe fatigue, fevers, or chills.  He does report some congestion but denies productive cough.   He denies any urinary symptoms or GI symptoms.  He reports feeling at his baseline overall.  According to patient, he has been on his medications as directed including metoprolol and Tikosyn for A. fib control.  According to documentation and speaking with the patient's PCP, Dr. Maryellen Pile, she spoke with the patient's cardiologist, Dr. Debara Pickett, and Dr. Loletha Grayer, who recommended patient be sent to the ED for IV diltiazem and then he needs to be admitted to [facility] for EP study and possible ablation.  On exam, lungs clear.  Chest is nontender.  Abdomen nontender.  Patient had a systolic murmur.  Minimal lower extremity edema seen.  Patient overall appears well and was alert and oriented.  EKG was performed confirming a flutter/A. fib with RVR.  Patient will screen laboratory testing to look for severe electrode abnormalities or occult infection converting to symptoms.  Patient was started on diltiazem as per recommendations by PCP conversation.  Laboratory testing results are seen above..  Metabolic panel appears similar to prior.  CBC shows no leukocytosis or anemia.  Magnesium normal.  Chest x-ray shows no pneumonia.  Cardiology was called who agreed with admission to [facility] for EP evaluation and further management.  They agreed with diltiazem initiation.  This was started.  Patient placed on drip.  Patient subsequently admitted to cardiology service in stable condition for further management of A. fib with RVR.    Final Clinical Impressions(s) / ED Diagnoses   Final diagnoses:  Atrial fibrillation with RVR ([place])  Clinical Impression: 1. Atrial fibrillation with RVR ([place])     Disposition: Admit  This note was prepared with assistance of Dragon voice recognition software. Occasional wrong-word or sound-a-like substitutions may have occurred due to the inherent limitations of voice recognition software.     Khloe Hunkele, Gwenyth Allegra, MD 07/06/17 [phone]

## 2017-07-05 NOTE — Progress Notes (Signed)
Subjective:  I acted as a Education administrator for Dr. Charlett Blake. [place], [place]  Patient ID: [name], male    DOB: [birth year], 67 y.o.   MRN: [id number]  Chief Complaint  Patient presents with  . A1c    3 month follow up  . Atrial Fibrillation    HPI  Patient is in today for a 3 month follow up and he actually reports he feels well today but was only [facility] after a cardioversion 2 weeks ago. Was surprised his heart rate was so high today. No recent febrile illness or diarrheal illness. No polyuria or polydipsia. Denies CP/palpB/HA/congestion/fevers/GI or GU c/o. Taking meds as prescribed. Always SOB  Patient Care Team: [name], MD as PCP - General Croitoru, Dani Gobble, MD as PCP - Cardiology (Cardiology) Croitoru, Dani Gobble, MD (Cardiology) Haverstock, Jennefer Bravo, MD as Consulting Physician (Dermatology) Rigoberto Noel, MD as Consulting Physician (Pulmonary Disease) Irine Seal, MD as Consulting Physician (Urology)   Past Medical History:  Diagnosis Date  . AORTIC STENOSIS, MODERATE 12/05/2009  . Atrial fibrillation ([place])   . Atrial flutter ([place]) 12/05/2009  . Atypical chest pain 11/08/2011  . BENIGN PROSTATIC HYPERTROPHY, HX OF 12/05/2009  . CHEST PAIN-UNSPECIFIED 11/11/2009  . CHF 12/05/2009  . Chronic renal insufficiency 12/16/2016  . Debility 04/18/2012  . Diarrhea 12/16/2016  . DM 12/05/2009  . DYSPNEA 12/19/2009  . Edema 04/18/2012  . ERECTILE DYSFUNCTION 01/25/2007  . FATIGUE, CHRONIC 11/11/2009  . Gout 07/10/2012  . Heart murmur   . Hyperkalemia 04/18/2012  . Hyperlipidemia   . Hypertension   . HYPERTENSION 01/25/2007  . Hypoxia 05/05/2011  . Insomnia 05/13/2016  . INSOMNIA, HX OF 01/25/2007  . Knee pain, bilateral 12/28/2010  . Knee pain, right 12/28/2010  . NEUROMA 01/05/2010  . OBESITY NOS 01/25/2007  . OSA (obstructive sleep apnea) 07/13/2014  . PERIPHERAL NEUROPATHY 01/05/2010  . PULMONARY FUNCTION TESTS, ABNORMAL 02/02/2010  . SLEEP APNEA 04/06/2010   uses cpap  . Superficial  thrombophlebitis of left leg 09/07/2011  . TESTICULAR HYPOFUNCTION 01/05/2010  . 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 Toe pain, right 07/10/2012  . UNSPECIFIED ANEMIA 01/05/2010    Past Surgical History:  Procedure Laterality Date  . bilat foot surgery removed part of 5th metatarsal to corect curvature of toes    . CARDIAC CATHETERIZATION  10/16/2009   nonischemic cardiomyopathy  . CARDIOVERSION N/A 03/25/2015   Procedure: CARDIOVERSION;  Surgeon: Pixie Casino, MD;  Location: [facility] ENDOSCOPY;  Service: Cardiovascular;  Laterality: N/A;  . CARDIOVERSION N/A 06/17/2017   Procedure: CARDIOVERSION;  Surgeon: Sanda Klein, MD;  Location: [facility] ENDOSCOPY;  Service: Cardiovascular;  Laterality: N/A;    Family History  Problem Relation Age of Onset  . Clotting disorder Mother   . Heart disease Mother        s/p MI  . Heart attack Mother   . Hypertension Mother   . Diabetes Mother   . Hyperlipidemia Mother   . Stroke Mother   . Cancer Father        ? lung  . Lung disease Father        smoker  . Diabetes Sister   . Hypertension Sister        smoker  . Leukemia Maternal Grandmother        ?  [name] Aneurysm Sister        brain  . Other Sister        clipped  . Seizures Sister  d/o w/aneurysm/ smoker    Social History   Socioeconomic History  . Marital status: Married    Spouse name: Not on file  . Number of children: Not on file  . Years of education: Not on file  . Highest education level: Not on file  Social Needs  . Financial resource strain: Not on file  . Food insecurity - worry: Not on file  . Food insecurity - inability: Not on file  . Transportation needs - medical: Not on file  . Transportation needs - non-medical: Not on file  Occupational History  . Not on file  Tobacco Use  . Smoking status: Former Smoker    Packs/day: 1.00    Years: 10.00    Pack years: 10.00    Last attempt to quit: 06/07/1980    Years since quitting: 37.1  . Smokeless tobacco: Never Used    Substance and Sexual Activity  . Alcohol use: No  . Drug use: No  . Sexual activity: Not Currently  Other Topics Concern  . Not on file  Social History Narrative  . Not on file    Outpatient Medications Prior to Visit  Medication Sig Dispense Refill  . albuterol (VENTOLIN HFA) 108 (90 Base) MCG/ACT inhaler Inhale 2 puffs into the lungs every 6 (six) hours as needed for wheezing or shortness of breath. 1 Inhaler 6  . allopurinol (ZYLOPRIM) 300 MG tablet TAKE ONE TABLET BY MOUTH ONCE DAILY 90 tablet 2  . benazepril (LOTENSIN) 40 MG tablet Take 1 tablet (40 mg total) by mouth daily. 90 tablet 3  . calcium carbonate (TUMS - DOSED IN MG ELEMENTAL CALCIUM) 500 MG chewable tablet Chew 1 tablet by mouth daily as needed. For heartburn    . cetirizine (ZYRTEC) 10 MG tablet Take 1 tablet (10 mg total) by mouth daily. 30 tablet 11  . Clobetasol Prop Emollient Base (CLOBETASOL PROPIONATE E) 0.05 % emollient cream Apply 1 application topically 2 (two) times daily as needed (insect bite). 60 g 1  . colchicine (COLCRYS) 0.6 MG tablet TAKE ONE TABLET BY MOUTH DAILY AS NEEDED FOR GOUT FLARE UPS. 30 tablet 0  . dofetilide (TIKOSYN) 500 MCG capsule Take 1 capsule (500 mcg total) by mouth 2 (two) times daily. 180 capsule 1  . fish oil-omega-3 fatty acids 1000 MG capsule Take 1 g by mouth daily.     . furosemide (LASIX) 40 MG tablet TAKE TWO TABLETS BY MOUTH EVERY MORNING AND TAKE ONE TABLET BY MOUTH EVERY AFTERNOON 270 tablet 3  . glyBURIDE (DIABETA) 5 MG tablet TAKE 1 TABLET BY MOUTH ONCE DAILY WITH BREAKFAST 90 tablet 0  . HYDROcodone-acetaminophen (NORCO) 10-325 MG tablet Take 1 tablet by mouth every 8 (eight) hours as needed for moderate pain or severe pain. 90 tablet 0  . KLOR-CON M20 20 MEQ tablet TAKE ONE TABLET BY MOUTH ONCE DAILY AND  EXTRA  TABLET  IF  WEIGHT  IS  ABOVE  210  LBS 180 tablet 2  . LORazepam (ATIVAN) 1 MG tablet Take 1 tablet (1 mg total) by mouth every 8 (eight) hours as needed for  anxiety. 30 tablet 3  . magnesium oxide (MAG-OX) 400 MG tablet Take 1 tablet (400 mg total) by mouth daily. 30 tablet 11  . metFORMIN (GLUCOPHAGE) 500 MG tablet TAKE 1 TABLET BY MOUTH THREE TIMES DAILY 270 tablet 0  . metoprolol tartrate (LOPRESSOR) 100 MG tablet Take 1.5 tablets (150 mg total) by mouth 2 (two) times daily. 180 tablet 3  .  Multiple Vitamins-Minerals (MULTIVITAMIN PO) Take 1 tablet by mouth daily.    . mupirocin ointment (BACTROBAN) 2 % Place 1 application into the nose 2 (two) times daily. 30 g 1  . naproxen (NAPROSYN) 375 MG tablet Take 1 tablet (375 mg total) by mouth daily. 30 tablet 4  . pravastatin (PRAVACHOL) 40 MG tablet TAKE ONE TABLET BY MOUTH ONCE DAILY 90 tablet 3  . temazepam (RESTORIL) 30 MG capsule TAKE 1 CAPSULE BY MOUTH AT BEDTIME AS NEEDED FOR SLEEP 30 capsule 3  . Testosterone 10 MG/ACT (2%) GEL Apply 1 application topically daily. Use as directed    . warfarin (COUMADIN) 4 MG tablet TAKE 1/2 TO 1 (ONE-HALF TO ONE) TABLET BY MOUTH DAILY OR AS DIRECTED BY COUMADIN CLINIC 90 tablet 1   No facility-administered medications prior to visit.     Allergies  Allergen Reactions  . Sulfonamide Derivatives Other (See Comments)    unknown reaction.  Patient states had to go to the ER.   [name] Zolpidem Other (See Comments)    Excessive, prolonged sedation    Review of Systems  Constitutional: Positive for malaise/fatigue. Negative for fever.  HENT: Negative for congestion.   Eyes: Negative for blurred vision.  Respiratory: Negative for cough and shortness of breath.   Cardiovascular: Negative for chest pain, palpitations and leg swelling.  Gastrointestinal: Negative for vomiting.  Musculoskeletal: Negative for back pain.  Skin: Negative for rash.  Neurological: Negative for loss of consciousness and headaches.  Psychiatric/Behavioral: The patient is nervous/anxious.        Objective:    Physical Exam  Constitutional: He is oriented to person, place, and time.  He appears well-developed and well-nourished. No distress.  HENT:  Head: Normocephalic and atraumatic.  Nose: Nose normal.  Eyes: Conjunctivae are normal. Right eye exhibits no discharge. Left eye exhibits no discharge.  Neck: Normal range of motion. Neck supple. No thyromegaly present.  Cardiovascular:  Murmur heard. Irregularly irregular, tachycardia  Pulmonary/Chest: Effort normal and breath sounds normal. He has no wheezes.  Abdominal: Soft. Bowel sounds are normal. There is no tenderness.  Musculoskeletal: Normal range of motion. He exhibits no edema or deformity.  Neurological: He is alert and oriented to person, place, and time.  Skin: Skin is warm and dry. He is not diaphoretic.  Psychiatric: He has a normal mood and affect.  Nursing note and vitals reviewed.   BP 122/79 (BP Location: Right Arm, Patient Position: Sitting, Cuff Size: Large)   Pulse (!) 123   Temp 98.4 F (36.9 C) (Oral)   Resp 16   Ht 5\' 11"  (1.803 m)   Wt 219 lb 3.2 oz (99.4 kg)   SpO2 98%   BMI 30.57 kg/m  Wt Readings from Last 3 Encounters:  07/05/17 219 lb 3.2 oz (99.4 kg)  06/14/17 221 lb (100.2 kg)  03/18/17 221 lb 3.2 oz (100.3 kg)   BP Readings from Last 3 Encounters:  07/05/17 122/79  06/17/17 92/66  06/14/17 108/78     Immunization History  Administered Date(s) Administered  . Influenza Split 03/03/2011, 03/10/2012  . Influenza Whole 03/28/2008, 03/30/2010  . Influenza,inj,Quad PF,6+ Mos 01/31/2013, 03/07/2014, 04/14/2015, 02/19/2016, 03/18/2017  . Pneumococcal Polysaccharide-23 10/07/2009  . Td 09/05/2009    Health Maintenance  Topic Date Due  . OPHTHALMOLOGY EXAM  01/07/1963  . COLONOSCOPY  01/07/2003  . FOOT EXAM  08/12/2017  . HEMOGLOBIN A1C  09/16/2017  . TETANUS/TDAP  09/06/2019  . INFLUENZA VACCINE  Completed  . Hepatitis C Screening  Addressed  . HIV Screening  Addressed    Lab Results  Component Value Date   WBC 4.2 06/14/2017   HGB 13.7 06/14/2017   HCT 41.0  06/14/2017   PLT 204 06/14/2017   GLUCOSE 143 (H) 06/14/2017   CHOL 134 03/14/2017   TRIG 158.0 (H) 03/14/2017   HDL 31.60 (L) 03/14/2017   LDLCALC 71 03/14/2017   ALT 12 03/18/2017   AST 14 03/18/2017   NA 141 06/14/2017   K 5.4 (H) 06/14/2017   CL 101 06/14/2017   CREATININE 1.57 (H) 06/14/2017   BUN 30 (H) 06/14/2017   CO2 24 06/14/2017   TSH 1.89 03/14/2017   PSA 4.26 (H) 12/29/2009   INR 2.5 06/14/2017   HGBA1C 7.2 (H) 03/18/2017   MICROALBUR 1.1 04/14/2015    Lab Results  Component Value Date   TSH 1.89 03/14/2017   Lab Results  Component Value Date   WBC 4.2 06/14/2017   HGB 13.7 06/14/2017   HCT 41.0 06/14/2017   MCV 80 06/14/2017   PLT 204 06/14/2017   Lab Results  Component Value Date   NA 141 06/14/2017   K 5.4 (H) 06/14/2017   CO2 24 06/14/2017   GLUCOSE 143 (H) 06/14/2017   BUN 30 (H) 06/14/2017   CREATININE 1.57 (H) 06/14/2017   BILITOT 0.6 03/18/2017   ALKPHOS 33 (L) 03/18/2017   AST 14 03/18/2017   ALT 12 03/18/2017   PROT 7.3 03/18/2017   ALBUMIN 4.3 03/18/2017   CALCIUM 10.1 06/14/2017   GFR 52.41 (L) 03/18/2017   Lab Results  Component Value Date   CHOL 134 03/14/2017   Lab Results  Component Value Date   HDL 31.60 (L) 03/14/2017   Lab Results  Component Value Date   LDLCALC 71 03/14/2017   Lab Results  Component Value Date   TRIG 158.0 (H) 03/14/2017   Lab Results  Component Value Date   CHOLHDL 4 03/14/2017   Lab Results  Component Value Date   HGBA1C 7.2 (H) 03/18/2017         Assessment & Plan:   Problem List Items Addressed This Visit    Diabetes mellitus type 2 in obese ([place])    hgba1c acceptable, minimize simple carbs. Increase exercise as tolerated      Relevant Orders   Hemoglobin A1c   Comprehensive metabolic panel   Lipid panel   Anemia    Increase leafy greens, consider increased lean red meat and using cast iron cookware. Continue to monitor, report any concerns      Relevant Orders   CBC    Lipid panel   Hyperlipidemia    Encouraged heart healthy diet, increase exercise, avoid trans fats, consider a krill oil cap daily      Relevant Orders   Lipid panel   A-fib ([name]) - Primary    Is following with cardiology today with R[facility] spoke with Dr Debara Pickett and Dr C and we will have him taken to ER her at [facility] for IV Cardizem to slow his rate and then he will require transfer to [facility] for EP study and further care. EKG confirms      Relevant Orders   Lipid panel   TSH   Lipid panel   EKG 12-Lead (Completed)   Essential hypertension, malignant    Well controlled, no changes to meds. Encouraged heart healthy diet such as the DASH diet and exercise as tolerated.          I am having [name]  Bonney Roussel maintain his fish oil-omega-3 fatty acids, calcium carbonate, Multiple Vitamins-Minerals (MULTIVITAMIN PO), Testosterone, mupirocin ointment, cetirizine, magnesium oxide, colchicine, naproxen, LORazepam, pravastatin, glyBURIDE, albuterol, Clobetasol Prop Emollient Base, HYDROcodone-acetaminophen, benazepril, KLOR-CON M20, allopurinol, temazepam, warfarin, dofetilide, metFORMIN, furosemide, and metoprolol tartrate.  No orders of the defined types were placed in this encounter.   CMA served as Education administrator during this visit. History, Physical and Plan performed by medical provider. Documentation and orders reviewed and attested to.  Penni Homans, MD

## 2017-07-05 NOTE — Patient Instructions (Addendum)
Consider Shinrgirx to prevent shingles and can get at the [facility] for Gastroesophageal Reflux Disease, Adult When you have gastroesophageal reflux disease (GERD), the foods you eat and your eating habits are very important. Choosing the right foods can help ease your discomfort. What guidelines do I need to follow?  Choose fruits, vegetables, whole grains, and low-fat dairy products.  Choose low-fat meat, fish, and poultry.  Limit fats such as oils, salad dressings, butter, nuts, and avocado.  Keep a food diary. This helps you identify foods that cause symptoms.  Avoid foods that cause symptoms. These may be different for everyone.  Eat small meals often instead of 3 large meals a day.  Eat your meals slowly, in a place where you are relaxed.  Limit fried foods.  Cook foods using methods other than frying.  Avoid drinking alcohol.  Avoid drinking large amounts of liquids with your meals.  Avoid bending over or lying down until 2-3 hours after eating. What foods are not recommended? These are some foods and drinks that may make your symptoms worse: Vegetables Tomatoes. Tomato juice. Tomato and spaghetti sauce. Chili peppers. Onion and garlic. Horseradish. Fruits Oranges, grapefruit, and lemon (fruit and juice). Meats High-fat meats, fish, and poultry. This includes hot dogs, ribs, ham, sausage, salami, and bacon. Dairy Whole milk and chocolate milk. Sour cream. Cream. Butter. Ice cream. Cream cheese. Drinks Coffee and tea. Bubbly (carbonated) drinks or energy drinks. Condiments Hot sauce. Barbecue sauce. Sweets/Desserts Chocolate and cocoa. Donuts. Peppermint and spearmint. Fats and Oils High-fat foods. This includes [place] fries and potato chips. Other Vinegar. Strong spices. This includes black pepper, white pepper, red pepper, cayenne, curry powder, cloves, ginger, and chili powder. The items listed above may not be a complete list of foods and drinks  to avoid. Contact your dietitian for more information. This information is not intended to replace advice given to you by your health care provider. Make sure you discuss any questions you have with your health care provider. Document Released: 11/23/2011 Document Revised: 10/30/2015 Document Reviewed: 03/28/2013 Elsevier Interactive Patient Education  2017 [place].  64 oz of clear fluids Criss Rosales Diet A bland diet consists of foods that do not have a lot of fat or fiber. Foods without fat or fiber are easier for the body to digest. They are also less likely to irritate your mouth, throat, stomach, and other parts of your gastrointestinal tract. A bland diet is sometimes called a BRAT diet. What is my plan? Your health care provider or dietitian may recommend specific changes to your diet to prevent and treat your symptoms, such as:  Eating small meals often.  Cooking food until it is soft enough to chew easily.  Chewing your food well.  Drinking fluids slowly.  Not eating foods that are very spicy, sour, or fatty.  Not eating citrus fruits, such as oranges and grapefruit.  What do I need to know about this diet?  Eat a variety of foods from the bland diet food list.  Do not follow a bland diet longer than you have to.  Ask your health care provider whether you should take vitamins. What foods can I eat? Grains  Hot cereals, such as cream of wheat. Bread, crackers, or tortillas made from refined white flour. Rice. Vegetables Canned or cooked vegetables. Mashed or boiled potatoes. Fruits Bananas. Applesauce. Other types of cooked or canned fruit with the skin and seeds removed, such as canned peaches or pears. Meats and Other Protein  Sources Scrambled eggs. Creamy peanut butter or other nut butters. Lean, well-cooked meats, such as chicken or fish. Tofu. Soups or broths. Dairy Low-fat dairy products, such as milk, cottage cheese, or yogurt. Beverages Water. Herbal tea.  Apple juice. Sweets and Desserts Pudding. Custard. Fruit gelatin. Ice cream. Fats and Oils Mild salad dressings. Canola or olive oil. The items listed above may not be a complete list of allowed foods or beverages. Contact your dietitian for more options. What foods are not recommended? Foods and ingredients that are often not recommended include:  Spicy foods, such as hot sauce or salsa.  Fried foods.  Sour foods, such as pickled or fermented foods.  Raw vegetables or fruits, especially citrus or berries.  Caffeinated drinks.  Alcohol.  Strongly flavored seasonings or condiments.  The items listed above may not be a complete list of foods and beverages that are not allowed. Contact your dietitian for more information. This information is not intended to replace advice given to you by your health care provider. Make sure you discuss any questions you have with your health care provider. Document Released: 09/15/2015 Document Revised: 10/30/2015 Document Reviewed: 06/05/2014 Elsevier Interactive Patient Education  2018 [employer].

## 2017-07-06 ENCOUNTER — Other Ambulatory Visit (HOSPITAL_COMMUNITY): Payer: Medicare Other

## 2017-07-06 ENCOUNTER — Encounter (HOSPITAL_COMMUNITY)
Admission: EM | Disposition: A | Discharge status: Home / Self Care | Payer: Self-pay | Source: Home / Self Care | Attending: Cardiology

## 2017-07-06 DIAGNOSIS — I5042 Chronic combined systolic (congestive) and diastolic (congestive) heart failure: Secondary | ICD-10-CM

## 2017-07-06 LAB — BASIC METABOLIC PANEL
ANION GAP: 11 (ref 5–15)
BUN: 29 mg/dL — ABNORMAL HIGH (ref 6–20)
CHLORIDE: 106 mmol/L (ref 101–111)
CO2: 25 mmol/L (ref 22–32)
Calcium: 9.6 mg/dL (ref 8.9–10.3)
Creatinine, Ser: 1.66 mg/dL — ABNORMAL HIGH (ref 0.61–1.24)
GFR calc non Af Amer: 42 mL/min — ABNORMAL LOW (ref >60)
GFR, EST AFRICAN AMERICAN: 49 mL/min — AB (ref >60)
Glucose, Bld: 112 mg/dL — ABNORMAL HIGH (ref 65–99)
Potassium: 4.4 mmol/L (ref 3.5–5.1)
Sodium: 142 mmol/L (ref 135–145)

## 2017-07-06 LAB — GLUCOSE, CAPILLARY
GLUCOSE-CAPILLARY: 103 mg/dL — AB (ref 65–99)
Glucose-Capillary: 93 mg/dL (ref 65–99)
Glucose-Capillary: 236 mg/dL — ABNORMAL HIGH (ref 65–99)
Glucose-Capillary: 180 mg/dL — ABNORMAL HIGH (ref 65–99)

## 2017-07-06 LAB — CBC
HEMATOCRIT: 43.4 % (ref 39.0–52.0)
HEMOGLOBIN: 13.6 g/dL (ref 13.0–17.0)
MCH: 26.6 pg (ref 26.0–34.0)
MCHC: 31.3 g/dL (ref 30.0–36.0)
MCV: 84.8 fL (ref 78.0–100.0)
Platelets: 231 10*3/uL (ref 150–400)
RBC: 5.12 MIL/uL (ref 4.22–5.81)
RDW: 16.3 % — ABNORMAL HIGH (ref 11.5–15.5)
WBC: 4.7 10*3/uL (ref 4.0–10.5)

## 2017-07-06 LAB — PROTIME-INR
INR: 2.13
Prothrombin Time: 23.7 seconds — ABNORMAL HIGH (ref 11.4–15.2)

## 2017-07-06 LAB — HIV ANTIBODY (ROUTINE TESTING W REFLEX): HIV Screen 4th Generation wRfx: NONREACTIVE

## 2017-07-06 SURGERY — ECHOCARDIOGRAM, TRANSESOPHAGEAL
Anesthesia: Moderate Sedation

## 2017-07-06 MED ORDER — RANOLAZINE ER 500 MG PO TB12
500.0000 mg | ORAL_TABLET | Freq: Two times a day (BID) | ORAL | Status: DC
  Administered 2017-07-06 – 2017-07-08 (×6): 500 mg via ORAL
  Filled 2017-07-06 (×8): qty 1

## 2017-07-06 MED ORDER — METOPROLOL SUCCINATE ER 100 MG PO TB24: 100.0000 mg | ORAL_TABLET | Freq: Every day | ORAL | Status: DC

## 2017-07-06 MED ORDER — METOPROLOL SUCCINATE ER 100 MG PO TB24
100.0000 mg | ORAL_TABLET | Freq: Two times a day (BID) | ORAL | Status: DC
  Administered 2017-07-06 – 2017-07-08 (×4): 100 mg via ORAL
  Filled 2017-07-06 (×7): qty 1

## 2017-07-06 MED ORDER — WARFARIN SODIUM 2 MG PO TABS
4.0000 mg | ORAL_TABLET | Freq: Once | ORAL | Status: AC
  Administered 2017-07-06: 4 mg via ORAL
  Filled 2017-07-06: qty 2

## 2017-07-06 MED ORDER — METOPROLOL SUCCINATE ER 50 MG PO TB24: 50.0000 mg | ORAL_TABLET | Freq: Every morning | ORAL | Status: DC

## 2017-07-06 NOTE — Progress Notes (Signed)
RN called CCMD this morning to verify patient's rhythm- stated that he is still in a-fib. Will continue to monitor

## 2017-07-06 NOTE — Progress Notes (Signed)
Nutrition Brief Note  Patient identified on the Malnutrition Screening Tool (MST) Report  Wt Readings from Last 15 Encounters:  07/06/17 211 lb 12.8 oz (96.1 kg)  07/05/17 219 lb 3.2 oz (99.4 kg)  06/14/17 221 lb (100.2 kg)  03/18/17 221 lb 3.2 oz (100.3 kg)  11/08/16 225 lb (102.1 kg)  08/12/16 226 lb 9.6 oz (102.8 kg)  08/05/16 230 lb (104.3 kg)  05/13/16 225 lb 4 oz (102.2 kg)  05/06/16 224 lb (101.6 kg)  02/19/16 223 lb 2 oz (101.2 kg)  02/13/16 217 lb (98.4 kg)  02/04/16 223 lb (101.2 kg)  12/11/15 218 lb (98.9 kg)  11/26/15 220 lb (99.8 kg)  11/17/15 221 lb 4 oz (100.4 kg)   [name] is a 67 y.o. male with a history of chronic diastolic heart failure, paroxysmal atrial fibrillation and atrial flutter (s/p cardioversion 06/17/17, on tikosyn and metoprolol), aortic insufficiency, HTN, OSA, dyslipidemia, DM2, who presents today with an elevated heart rate, found to be in Aflutter w RVR.   Pt admitted with a-fib.   Spoke with pt and wife at bedside. Pt reports decreased appetite for the past several months up until 2 weeks ago; he shares that he experienced decreased functional status prior to receiving last cardioversion 2 weeks ago. Pt typically consumes one large meal per day (consisting of fish, chicken, or [place] and vegetables) and snacks throughout the day. Pt estimates he lost 15# over the past 6 months, which is not consistent with wt hx. Noted pt has experienced a 4.5% wt loss over the past month, which is not significant for time frame. Wt hx also difficult to assess given fluid status changes.   Nutrition-Focused physical exam completed. Findings are no fat depletion, no muscle depletion, and no edema.   Last Hgb A1c: 7.2 (03/18/18). Labs reviewed: CBGS: 93-195 (inpatient orders for glycemic control are 0-15 units TID with meals, 0-5 units insulin aspart q HS).   Body mass index is 29.54 kg/m. Patient meets criteria for overweight based on current BMI.   Current  diet order is Heart Healthy/ Carb Modified, patient is consuming approximately n/a% of meals at this time. Labs and medications reviewed.   No nutrition interventions warranted at this time. If nutrition issues arise, please consult RD.   [name] A. Jimmye Norman, RD, LDN, CDE Pager: [phone] After hours Pager: [phone]

## 2017-07-06 NOTE — Progress Notes (Signed)
[place] for warfarin Indication: atrial fibrillation  Allergies  Allergen Reactions  . Sulfonamide Derivatives Other (See Comments)    unknown reaction.  Patient states had to go to the ER.   [name] Zolpidem Other (See Comments)    Excessive, prolonged sedation    Patient Measurements: Height: 5\' 11"  (180.3 cm) Weight: 211 lb 12.8 oz (96.1 kg) IBW/kg (Calculated) : 75.3  Vital Signs: Temp: 98.1 F (36.7 C) (01/30 1153) Temp Source: Oral (01/30 1153) BP: 108/77 (01/30 1153) Pulse Rate: 125 (01/30 1153)  Labs: Recent Labs    07/05/17 1651 07/06/17 0408  HGB 14.1 13.6  HCT 44.6 43.4  PLT 233 231  LABPROT 22.3* 23.7*  INR 1.98 2.13  CREATININE 1.63* 1.66*  TROPONINI <0.03  --     Estimated Creatinine Clearance: 53.2 mL/min (A) (by C-G formula based on SCr of 1.66 mg/dL (H)).  Assessment: 67 yo f presenting with tachycardia- history of AFib s/p cardioversion on 06/17/17, but back in Afib with no symptoms.  To continue warfarin- home dose is 4mg  MWF, 2mg  all other days.  INR 2.13 this morning. CBC stable, no bleeding noted.  Noted EP has been consulted for potentially DCCV.  Goal of Therapy:  INR 2-3 Monitor platelets by anticoagulation protocol: Yes   Plan:  Warfarin 4mg  x 1 tonight  Daily INR F/U Plans for DCCV  Pape Parson D. Maxfield Gildersleeve, PharmD, BCPS Clinical Pharmacist Clinical Phone for 07/06/2017 until 3:30pm: [phone] If after 3:30pm, please call main pharmacy at [phone] 07/06/2017 12:24 PM

## 2017-07-06 NOTE — Progress Notes (Signed)
Progress Note  Patient Name: [name] Date of Encounter: 07/06/2017  Primary Cardiologist: Sanda Klein, MD   Subjective   Denies any chest discomfort, shortness of breath, palpitations, dizziness. Says "I feel fine". Pt has no awareness of his afib.  Inpatient Medications    Scheduled Meds: . allopurinol  300 mg Oral Daily  . benazepril  40 mg Oral Daily  . dofetilide  500 mcg Oral BID  . feeding supplement (ENSURE ENLIVE)  237 mL Oral BID BM  . furosemide  40 mg Oral QPM  . furosemide  80 mg Oral Q breakfast  . insulin aspart  0-15 Units Subcutaneous TID WC  . insulin aspart  0-5 Units Subcutaneous QHS  . magnesium oxide  400 mg Oral Daily  . metoprolol succinate  50 mg Oral BID  . multivitamin with minerals  1 tablet Oral Daily  . omega-3 acid ethyl esters  1 g Oral Daily  . potassium chloride SA  20 mEq Oral Daily  . pravastatin  40 mg Oral Daily  . sodium chloride flush  3 mL Intravenous Q12H  . Warfarin - Pharmacist Dosing Inpatient   Does not apply q1800   Continuous Infusions: . sodium chloride 20 mL/hr at 07/06/17 0534  . sodium chloride     PRN Meds: acetaminophen, albuterol, HYDROcodone-acetaminophen, LORazepam, ondansetron (ZOFRAN) IV, sodium chloride flush, temazepam   Vital Signs    Vitals:   07/06/17 0101 07/06/17 0433 07/06/17 0933 07/06/17 1037  BP: (!) 121/94 105/68 90/71 104/70  Pulse: (!) 107 (!) 130 (!) 116 (!) 120  Resp: 18 18    Temp: 98.2 F (36.8 C) 98.3 F (36.8 C)    TempSrc: Oral Oral    SpO2: 99% 95%    Weight:  211 lb 12.8 oz (96.1 kg)    Height:        Intake/Output Summary (Last 24 hours) at 07/06/2017 1053 Last data filed at 07/06/2017 1020 Gross per 24 hour  Intake 0 ml  Output 200 ml  Net -200 ml   Filed Weights   07/05/17 1600 07/05/17 2113 07/06/17 0433  Weight: 219 lb (99.3 kg) 212 lb (96.2 kg) 211 lb 12.8 oz (96.1 kg)    Telemetry    Atrial fibrillation, occ afib with rates 100-120's - Personally  Reviewed  ECG    Afib with RVR, 111 bpm - Personally Reviewed  Physical Exam   GEN: No acute distress.   Neck: No JVD Cardiac: Irregular irregular rhythm, tachycardiac, no murmurs, rubs, or gallops.  Respiratory: Clear to auscultation bilaterally. GI: Soft, nontender, non-distended  MS: No edema; No deformity. Neuro:  Nonfocal  Psych: Normal affect   Labs    Chemistry Recent Labs  Lab 07/05/17 1651 07/06/17 0408  NA 137 142  K 4.4 4.4  CL 106 106  CO2 24 25  GLUCOSE 111* 112*  BUN 33* 29*  CREATININE 1.63* 1.66*  CALCIUM 9.8 9.6  GFRNONAA 43* 42*  GFRAA 50* 49*  ANIONGAP 7 11     Hematology Recent Labs  Lab 07/05/17 1651 07/06/17 0408  WBC 5.1 4.7  RBC 5.38 5.12  HGB 14.1 13.6  HCT 44.6 43.4  MCV 82.9 84.8  MCH 26.2 26.6  MCHC 31.6 31.3  RDW 17.2* 16.3*  PLT 233 231    Cardiac Enzymes Recent Labs  Lab 07/05/17 1651  TROPONINI <0.03   No results for input(s): TROPIPOC in the last 168 hours.   BNPNo results for input(s): BNP, PROBNP in  the last 168 hours.   DDimer No results for input(s): DDIMER in the last 168 hours.   Radiology    Dg Chest 2 View  Result Date: 07/05/2017 CLINICAL DATA:  Atrial fibrillation EXAM: CHEST  2 VIEW COMPARISON:  March 20, 2013 FINDINGS: The heart size and mediastinal contours are stable. The heart size is enlarged. There is no focal infiltrate, pulmonary edema, or pleural effusion. The visualized skeletal structures are unremarkable. IMPRESSION: No active cardiopulmonary disease.  Cardiomegaly. Electronically Signed   By: Abelardo Diesel M.D.   On: 07/05/2017 18:01    Cardiac Studies   Echocardiogram 06/16/17: Study Conclusions - Left ventricle: The cavity size was normal. There was moderate concentric hypertrophy. Systolic function was moderately to severely reduced. The estimated ejection fraction was in the range of 30% to 35%. Severe diffuse hypokinesis with distinct regional wall motion  abnormalities. There is akinesis of the apical myocardium. The study is not technically sufficient to allow evaluation of LV diastolic function. - Aortic valve: There was mild to moderate regurgitation directed eccentrically in the LVOT and towards the mitral anterior leaflet. - Aorta: Aortic root dimension: 40 mm (ED). Ascending aortic diameter: 39 mm (S). - Aortic root: The aortic root was mildly dilated. - Ascending aorta: The ascending aorta was mildly dilated. - Mitral valve: Calcified annulus. Mildly thickened leaflets . There was moderate regurgitation directed eccentrically and posteriorly. - Left atrium: The atrium was mildly dilated. - Right ventricle: The cavity size was mildly dilated. Wall thickness was normal. - Right atrium: The atrium was mildly dilated. - Pulmonary arteries: PA peak pressure: 43 mm Hg (S).  Impressions: - Compared to prior echo, LVF has significantly declined with GLLS of -7%, mild RVE, mild RAE, MAC with mild MR, AVSC, moderate LVH, dilated aortic root, moderate pulmonary HTN. The right ventricular systolic pressure was increased consistent with moderate pulmonary hypertension.  Patient Profile     67 y.o. male with a history of chronic diastolic heart failure, paroxysmal atrial fibrillation and atrial flutter (s/p cardioversion 06/17/17, on tikosyn and metoprolol), aortic insufficiency, HTN, OSA, dyslipidemia, DM2, was found to have an elevated heart rate, found to be in Aflutter w RVR at his routine PCP visit. He had no awareness of it, no symptoms. Sent to [facility].   Has been on Tikosyn for several years maintaining sinus rhythm with last breakthrough afib in 2016 until found to be ion afib in early January. Had DCCV on 06/17/2017, converted to sinus rhythm.   Assessment & Plan    1. Paroxysmal atrial fibrillation/flutter -Pt has history of both atrial fibrillation and flutter. Had DCCV for afib on 06/17/17. Found to be in afib  with RVR at routine PCP appt. Pt asymptomatic. Did not note any changes after his DCCV and had no awareness of when he went into afib/flutter.  -Pt on chronic Tikosyn 500 mg BID and metoprolol tartrate 150 mg bid. Reports compliance with meds.  -CHA2DS2/VAS Stroke Risk Score is 3 (CHF, HTN, DM). Is anticoagulated with warfarin. INR on presentation was 1.98, today INR is therapeutic at  2.13. INR has been therapeutic since at least June except for the very slight excursion of 1.98.  -Was initially started on diltiazem, but this was stopped due to decreased LV function. Tikosyn has been continued and he is given metoprolol succinate 50 mg BID (switched from tartrate in setting of reduced LV function). K+ 4.4, Mag 1.9. Difficult to measure QTC with current rhythm- .478 per EKG print out.  -Continues in  atrial flutter, occ fib, with rate 100-120's. Continues to deny symptoms.  -Ultimately would likely benefit from atrial fib and atrial flutter ablation. -Pt is tentatively planned for TEE guided DCCV, not on schedule. For EP consult. I will contact Dr. Caryl Comes.     2. Chronic systolic CHF: Echo in 1/75 in setting of afib/RVR with EF 30-35%.  ?tachy-mediated CMP.  Need to keep in NSR and recheck EF.  No more than mild volume overload on exam.  - Continue home Lasix.  - Continue benazepril 40 mg daily.  - Metoprolol tartrate switched to Toprol XL as above. BP soft. 90/71 and 104/70 this am.   3. CKD: Stage 3, appears stable compared to the past. SCr 1.66 today.  4. Aortic insufficiency: mild to moderate on last echo.   5. OSA: Pt states he uses his CPAP every night "religiously"   For questions or updates, please contact [place] Please consult [email or web address] for contact info under Cardiology/STEMI.      Signed, Daune Perch, NP  07/06/2017, 10:53 AM    I have seen and examined the patient along with Daune Perch, NP .  I have reviewed the chart, notes and new data.  I agree with PA/NP's  note.  Key new complaints: No complaints at rest. Key examination changes: Remains in atrial fibrillation with rapid ventricular response, clear lungs, no jugular venous distention, no edema, no S3 Key new findings / data: Electrolytes normal range.  Well anticoagulated with the exception of a very brief and borderline reduction in INR to 1.98.  QTc 516 ms, but difficult to measure accurately due to coarse atrial fibrillation  PLAN: Few options for management of atrial fibrillation. Rate control remains critical to prevent worsening of tachycardia related cardiomyopathy.  We will increase the metoprolol to 100 mg twice daily.  If blood pressure is too low can reduce the dose of other antihypertensive medications, specifically benazepril. Only other antiarrhythmic likely to work would be amiodarone, which he did not tolerate in the past due to severe nausea and vomiting. Discussed with Dr. Caryl Comes.  We will try to add ranolazine 500 mg twice daily to the dofetilide.  Monitor QT interval.  Allow roughly 4-5 doses of her nose seen before trying cardioversion again.  Tentatively scheduled for Friday noon for cardioversion. If this strategy is unsuccessful or unable to tolerate releasing due to excessive QT prolongation, consider invasive ablation. I doubt he will do well with atrial fibrillation ablation.  Although this most recent echocardiogram describes his left atrium has been mildly dilated, my opinion is left atrium is actually moderate to severely dilated, described on echo is from 2014 in 2017.  This will lessen the likelihood of successful ablation. A final option would be AV node ablation with placement of a biventricular pacemaker to do the presence of cardiomyopathy.  This will make him pacemaker dependent. These options were all briefly discussed with Mr. Canterbury.  Obviously, the patient and I both would prefer the least invasive approach first.  Sanda Klein, MD, [place] [phone] 07/06/2017, 1:52 PM

## 2017-07-06 NOTE — Progress Notes (Signed)
Endo states they want patient to receive cardiac medications before procedure.

## 2017-07-06 NOTE — Progress Notes (Signed)
MD states pt may eat.

## 2017-07-06 NOTE — Progress Notes (Signed)
On arrival pt is already on CPAP tolerating it well. No distress or complications noted.

## 2017-07-07 DIAGNOSIS — G4733 Obstructive sleep apnea (adult) (pediatric): Secondary | ICD-10-CM

## 2017-07-07 LAB — URINE CULTURE: CULTURE: NO GROWTH

## 2017-07-07 LAB — GLUCOSE, CAPILLARY
GLUCOSE-CAPILLARY: 164 mg/dL — AB (ref 65–99)
GLUCOSE-CAPILLARY: 179 mg/dL — AB (ref 65–99)
Glucose-Capillary: 126 mg/dL — ABNORMAL HIGH (ref 65–99)
Glucose-Capillary: 116 mg/dL — ABNORMAL HIGH (ref 65–99)

## 2017-07-07 LAB — PROTIME-INR
INR: 2.51
PROTHROMBIN TIME: 26.9 s — AB (ref 11.4–15.2)

## 2017-07-07 MED ORDER — WARFARIN SODIUM 2 MG PO TABS
2.0000 mg | ORAL_TABLET | Freq: Once | ORAL | Status: AC
  Administered 2017-07-07: 2 mg via ORAL
  Filled 2017-07-07: qty 1

## 2017-07-07 MED ORDER — BENAZEPRIL HCL 20 MG PO TABS
20.0000 mg | ORAL_TABLET | Freq: Every day | ORAL | Status: DC
  Filled 2017-07-07: qty 1

## 2017-07-07 NOTE — Progress Notes (Signed)
[facility] for warfarin Indication: atrial fibrillation  Allergies  Allergen Reactions  . Sulfonamide Derivatives Other (See Comments)    unknown reaction.  Patient states had to go to the ER.   [name] Zolpidem Other (See Comments)    Excessive, prolonged sedation    Patient Measurements: Height: 5\' 11"  (180.3 cm) Weight: 213 lb 1.6 oz (96.7 kg) IBW/kg (Calculated) : 75.3  Vital Signs: Temp: 98.4 F (36.9 C) (01/31 0848) Temp Source: Oral (01/31 0848) BP: 108/75 (01/31 0848) Pulse Rate: 114 (01/31 0848)  Labs: Recent Labs    07/05/17 1651 07/06/17 0408 07/07/17 0609  HGB 14.1 13.6  --   HCT 44.6 43.4  --   PLT 233 231  --   LABPROT 22.3* 23.7* 26.9*  INR 1.98 2.13 2.51  CREATININE 1.63* 1.66*  --   TROPONINI <0.03  --   --     Estimated Creatinine Clearance: 53.4 mL/min (A) (by C-G formula based on SCr of 1.66 mg/dL (H)).  Assessment: 67 yo f presenting with tachycardia- history of AFib s/p cardioversion on 06/17/17, but back in Afib with no symptoms.  To continue warfarin- home dose is 4mg  MWF, 2mg  all other days.  INR 2.5 this morning. No new CBC this morning, but no bleeding noted.  Goal of Therapy:  INR 2-3 Monitor platelets by anticoagulation protocol: Yes   Plan:  Warfarin 2mg  x 1 tonight  Daily INR DCCV planned for Friday  Lucina Betty D. Ozie Lupe, PharmD, BCPS Clinical Pharmacist Clinical Phone for 07/07/2017 until 3:30pm: [phone] If after 3:30pm, please call main pharmacy at [phone] 07/07/2017 10:21 AM

## 2017-07-07 NOTE — Plan of Care (Signed)
Pt. Able to complete ADLs independently.

## 2017-07-07 NOTE — H&P (View-Only) (Signed)
Progress Note  Patient Name: [name] Date of Encounter: 07/07/2017  Primary Cardiologist: Sanda Klein, MD   Subjective   Pt up walking in the halls. Feels well, symptomatic.   Inpatient Medications    Scheduled Meds: . allopurinol  300 mg Oral Daily  . benazepril  40 mg Oral Daily  . dofetilide  500 mcg Oral BID  . furosemide  40 mg Oral QPM  . furosemide  80 mg Oral Q breakfast  . insulin aspart  0-15 Units Subcutaneous TID WC  . insulin aspart  0-5 Units Subcutaneous QHS  . magnesium oxide  400 mg Oral Daily  . metoprolol succinate  100 mg Oral BID  . multivitamin with minerals  1 tablet Oral Daily  . omega-3 acid ethyl esters  1 g Oral Daily  . potassium chloride SA  20 mEq Oral Daily  . pravastatin  40 mg Oral Daily  . ranolazine  500 mg Oral BID  . sodium chloride flush  3 mL Intravenous Q12H  . warfarin  2 mg Oral ONCE-1800  . Warfarin - Pharmacist Dosing Inpatient   Does not apply q1800   Continuous Infusions: . sodium chloride Stopped (07/07/17 0700)  . sodium chloride     PRN Meds: acetaminophen, albuterol, HYDROcodone-acetaminophen, LORazepam, ondansetron (ZOFRAN) IV, sodium chloride flush, temazepam   Vital Signs    Vitals:   07/07/17 0006 07/07/17 0558 07/07/17 0848 07/07/17 0900  BP: 106/71 108/75 108/75   Pulse: (!) 120 (!) 102 (!) 114   Resp: 20 18    Temp: 97.6 F (36.4 C) 98.6 F (37 C) 98.4 F (36.9 C)   TempSrc: Oral Oral Oral   SpO2: 100% 96% 98%   Weight:  213 lb 6.4 oz (96.8 kg)  213 lb 1.6 oz (96.7 kg)  Height:        Intake/Output Summary (Last 24 hours) at 07/07/2017 1040 Last data filed at 07/07/2017 0937 Gross per 24 hour  Intake 1468.41 ml  Output 1425 ml  Net 43.41 ml   Filed Weights   07/06/17 0433 07/07/17 0558 07/07/17 0900  Weight: 211 lb 12.8 oz (96.1 kg) 213 lb 6.4 oz (96.8 kg) 213 lb 1.6 oz (96.7 kg)    Telemetry    Afib in the 100's-120's overnight, up to 140s' with walking this am - Personally  Reviewed  ECG    No new tracings - Personally Reviewed  Physical Exam   GEN: No acute distress.   Neck: No JVD Cardiac: Irregular irregular rhythm, no murmurs, rubs, or gallops.  Respiratory: Clear to auscultation bilaterally. GI: Soft, nontender, non-distended  MS: No edema; No deformity. Neuro:  Nonfocal  Psych: Normal affect   Labs    Chemistry Recent Labs  Lab 07/05/17 1651 07/06/17 0408  NA 137 142  K 4.4 4.4  CL 106 106  CO2 24 25  GLUCOSE 111* 112*  BUN 33* 29*  CREATININE 1.63* 1.66*  CALCIUM 9.8 9.6  GFRNONAA 43* 42*  GFRAA 50* 49*  ANIONGAP 7 11     Hematology Recent Labs  Lab 07/05/17 1651 07/06/17 0408  WBC 5.1 4.7  RBC 5.38 5.12  HGB 14.1 13.6  HCT 44.6 43.4  MCV 82.9 84.8  MCH 26.2 26.6  MCHC 31.6 31.3  RDW 17.2* 16.3*  PLT 233 231    Cardiac Enzymes Recent Labs  Lab 07/05/17 1651  TROPONINI <0.03   No results for input(s): TROPIPOC in the last 168 hours.   BNPNo results for  input(s): BNP, PROBNP in the last 168 hours.   DDimer No results for input(s): DDIMER in the last 168 hours.   Radiology    Dg Chest 2 View  Result Date: 07/05/2017 CLINICAL DATA:  Atrial fibrillation EXAM: CHEST  2 VIEW COMPARISON:  March 20, 2013 FINDINGS: The heart size and mediastinal contours are stable. The heart size is enlarged. There is no focal infiltrate, pulmonary edema, or pleural effusion. The visualized skeletal structures are unremarkable. IMPRESSION: No active cardiopulmonary disease.  Cardiomegaly. Electronically Signed   By: Abelardo Diesel M.D.   On: 07/05/2017 18:01    Cardiac Studies   Echocardiogram 06/16/17: Study Conclusions - Left ventricle: The cavity size was normal. There was moderate concentric hypertrophy. Systolic function was moderately to severely reduced. The estimated ejection fraction was in the range of 30% to 35%. Severe diffuse hypokinesis with distinct regional wall motion abnormalities. There is akinesis  of the apical myocardium. The study is not technically sufficient to allow evaluation of LV diastolic function. - Aortic valve: There was mild to moderate regurgitation directed eccentrically in the LVOT and towards the mitral anterior leaflet. - Aorta: Aortic root dimension: 40 mm (ED). Ascending aortic diameter: 39 mm (S). - Aortic root: The aortic root was mildly dilated. - Ascending aorta: The ascending aorta was mildly dilated. - Mitral valve: Calcified annulus. Mildly thickened leaflets . There was moderate regurgitation directed eccentrically and posteriorly. - Left atrium: The atrium was mildly dilated. - Right ventricle: The cavity size was mildly dilated. Wall thickness was normal. - Right atrium: The atrium was mildly dilated. - Pulmonary arteries: PA peak pressure: 43 mm Hg (S).  Impressions: - Compared to prior echo, LVF has significantly declined with GLLS of -7%, mild RVE, mild RAE, MAC with mild MR, AVSC, moderate LVH, dilated aortic root, moderate pulmonary HTN. The right ventricular systolic pressure was increased consistent with moderate pulmonary hypertension.   Patient Profile     67 y.o. male  with a history of chronic diastolic heart failure, paroxysmal atrial fibrillation and atrial flutter (s/p cardioversion 06/17/17, on tikosyn and metoprolol), aortic insufficiency, HTN, OSA, dyslipidemia, DM2, was found to have an elevated heart rate, found to be in Aflutter w RVR at his routine PCP visit. He had no awareness of it, no symptoms. Sent to [facility].   Has been on Tikosyn for several years maintaining sinus rhythm with last breakthrough afib in 2016 until found to be in afib in early January. Had DCCV on 06/17/2017, converted to sinus rhythm.   Assessment & Plan    1. Paroxysmal atrial fibrillation -On Tikosyn and metoprolol, found to be in afib and had electrical cardioversion on 06/17/17 to sinus rhythm. Found to be in afib with RVR  incidentally at PCP office on 1/29. He was asymptomatic. Compliant on his meds.  -CHA2DS2/VAS Stroke Risk Score is 3 (CHF, HTN, DM). Is anticoagulated with warfarin. INR on presentation was 1.98, today INR is therapeutic at  2.51. INR has been therapeutic since at least June except for the very slight excursion of 1.98. Pharmacy managing dosing.  -He is continued on Tikosyn. His metoprolol tartrate was switched to succinate (has reduced EF), TOPROL XL 50 mg bid and then increased to 100 mg BID yesterday. Last night's dose of Toprol was held due to HR of 69 on documented VS (likely from dynamap), but tele clearly shows HR in the 100's-120's consistently. BP is a little soft, 108/75. Monitor on today's dosing. Will decrease benazapril to make room in  BP.  -Dr. Sallyanne Kuster discussed case with Dr. Caryl Comes (EP): Rate control remains critical to prevent worsening of tachycardia related cardiomyopathy. Only other antiarrhythmic likely to work would be amiodarone, which he did not tolerate in the past due to severe nausea and vomiting. Discussed with Dr. Caryl Comes.  We will try to add ranolazine 500 mg twice daily to the dofetilide.  Monitor QT interval. Allow roughly 4-5 doses of her nose seen before trying cardioversion again. Is scheduled for DCCV tomorrow at 12:00 with Dr. Harrington Challenger. -Per Dr. Sallyanne Kuster : If this strategy is unsuccessful or unable to tolerate releasing due to excessive QT prolongation, consider invasive ablation. I doubt he will do well with atrial fibrillation ablation.  Although this most recent echocardiogram describes his left atrium has been mildly dilated, my opinion is left atrium is actually moderate to severely dilated, described on echo is from 2014 in 2017.  This will lessen the likelihood of successful ablation. A final option would be AV node ablation with placement of a biventricular pacemaker to do the presence of cardiomyopathy.  This will make him pacemaker dependent. These options were all  briefly discussed with Mr. Wittwer.  Obviously, the patient and I both would prefer the least invasive approach first.  -QTC is difficult to measure, hard to discern T wave with his course flutter waves. My best estimate on tele using calipers is  490.  2. Chronic systolic CHF:  -Echo in 8/11 in setting of afib/RVR with EF 30-35%. ?tachy-mediated CMP. Need to keep in NSR and recheck EF. No more than mild volume overload on exam.  - Continue home Lasix.  - Continue benazepril, decrease to allow for room in BP for increased Toprol  3. CKD stage 3 -SCr stable at 1.66  4. Aortic insufficiency: mild to moderate on last echo.  5. OSA: Pt states he uses his CPAP every night "religiously"   For questions or updates, please contact [place] Please consult [email or web address] for contact info under Cardiology/STEMI.      Signed, Daune Perch, NP  07/07/2017, 10:40 AM    I have seen and examined the patient along with Daune Perch, NP .  I have reviewed the chart, notes and new data.  I agree with PA's note.  Key new complaints: no new complaints Key examination changes: ventricular rate 100-110 at rest, 140s walking the hall  PLAN: DC cardioversion tomorrow, after 4 doses of ranolazine.  Sanda Klein, MD, [place] [phone] 07/07/2017, 11:49 AM

## 2017-07-07 NOTE — Progress Notes (Signed)
Progress Note  Patient Name: [name] Date of Encounter: 07/07/2017  Primary Cardiologist: Sanda Klein, MD   Subjective   Pt up walking in the halls. Feels well, symptomatic.   Inpatient Medications    Scheduled Meds: . allopurinol  300 mg Oral Daily  . benazepril  40 mg Oral Daily  . dofetilide  500 mcg Oral BID  . furosemide  40 mg Oral QPM  . furosemide  80 mg Oral Q breakfast  . insulin aspart  0-15 Units Subcutaneous TID WC  . insulin aspart  0-5 Units Subcutaneous QHS  . magnesium oxide  400 mg Oral Daily  . metoprolol succinate  100 mg Oral BID  . multivitamin with minerals  1 tablet Oral Daily  . omega-3 acid ethyl esters  1 g Oral Daily  . potassium chloride SA  20 mEq Oral Daily  . pravastatin  40 mg Oral Daily  . ranolazine  500 mg Oral BID  . sodium chloride flush  3 mL Intravenous Q12H  . warfarin  2 mg Oral ONCE-1800  . Warfarin - Pharmacist Dosing Inpatient   Does not apply q1800   Continuous Infusions: . sodium chloride Stopped (07/07/17 0700)  . sodium chloride     PRN Meds: acetaminophen, albuterol, HYDROcodone-acetaminophen, LORazepam, ondansetron (ZOFRAN) IV, sodium chloride flush, temazepam   Vital Signs    Vitals:   07/07/17 0006 07/07/17 0558 07/07/17 0848 07/07/17 0900  BP: 106/71 108/75 108/75   Pulse: (!) 120 (!) 102 (!) 114   Resp: 20 18    Temp: 97.6 F (36.4 C) 98.6 F (37 C) 98.4 F (36.9 C)   TempSrc: Oral Oral Oral   SpO2: 100% 96% 98%   Weight:  213 lb 6.4 oz (96.8 kg)  213 lb 1.6 oz (96.7 kg)  Height:        Intake/Output Summary (Last 24 hours) at 07/07/2017 1040 Last data filed at 07/07/2017 0937 Gross per 24 hour  Intake 1468.41 ml  Output 1425 ml  Net 43.41 ml   Filed Weights   07/06/17 0433 07/07/17 0558 07/07/17 0900  Weight: 211 lb 12.8 oz (96.1 kg) 213 lb 6.4 oz (96.8 kg) 213 lb 1.6 oz (96.7 kg)    Telemetry    Afib in the 100's-120's overnight, up to 140s' with walking this am - Personally  Reviewed  ECG    No new tracings - Personally Reviewed  Physical Exam   GEN: No acute distress.   Neck: No JVD Cardiac: Irregular irregular rhythm, no murmurs, rubs, or gallops.  Respiratory: Clear to auscultation bilaterally. GI: Soft, nontender, non-distended  MS: No edema; No deformity. Neuro:  Nonfocal  Psych: Normal affect   Labs    Chemistry Recent Labs  Lab 07/05/17 1651 07/06/17 0408  NA 137 142  K 4.4 4.4  CL 106 106  CO2 24 25  GLUCOSE 111* 112*  BUN 33* 29*  CREATININE 1.63* 1.66*  CALCIUM 9.8 9.6  GFRNONAA 43* 42*  GFRAA 50* 49*  ANIONGAP 7 11     Hematology Recent Labs  Lab 07/05/17 1651 07/06/17 0408  WBC 5.1 4.7  RBC 5.38 5.12  HGB 14.1 13.6  HCT 44.6 43.4  MCV 82.9 84.8  MCH 26.2 26.6  MCHC 31.6 31.3  RDW 17.2* 16.3*  PLT 233 231    Cardiac Enzymes Recent Labs  Lab 07/05/17 1651  TROPONINI <0.03   No results for input(s): TROPIPOC in the last 168 hours.   BNPNo results for  input(s): BNP, PROBNP in the last 168 hours.   DDimer No results for input(s): DDIMER in the last 168 hours.   Radiology    Dg Chest 2 View  Result Date: 07/05/2017 CLINICAL DATA:  Atrial fibrillation EXAM: CHEST  2 VIEW COMPARISON:  March 20, 2013 FINDINGS: The heart size and mediastinal contours are stable. The heart size is enlarged. There is no focal infiltrate, pulmonary edema, or pleural effusion. The visualized skeletal structures are unremarkable. IMPRESSION: No active cardiopulmonary disease.  Cardiomegaly. Electronically Signed   By: Abelardo Diesel M.D.   On: 07/05/2017 18:01    Cardiac Studies   Echocardiogram 06/16/17: Study Conclusions - Left ventricle: The cavity size was normal. There was moderate concentric hypertrophy. Systolic function was moderately to severely reduced. The estimated ejection fraction was in the range of 30% to 35%. Severe diffuse hypokinesis with distinct regional wall motion abnormalities. There is akinesis  of the apical myocardium. The study is not technically sufficient to allow evaluation of LV diastolic function. - Aortic valve: There was mild to moderate regurgitation directed eccentrically in the LVOT and towards the mitral anterior leaflet. - Aorta: Aortic root dimension: 40 mm (ED). Ascending aortic diameter: 39 mm (S). - Aortic root: The aortic root was mildly dilated. - Ascending aorta: The ascending aorta was mildly dilated. - Mitral valve: Calcified annulus. Mildly thickened leaflets . There was moderate regurgitation directed eccentrically and posteriorly. - Left atrium: The atrium was mildly dilated. - Right ventricle: The cavity size was mildly dilated. Wall thickness was normal. - Right atrium: The atrium was mildly dilated. - Pulmonary arteries: PA peak pressure: 43 mm Hg (S).  Impressions: - Compared to prior echo, LVF has significantly declined with GLLS of -7%, mild RVE, mild RAE, MAC with mild MR, AVSC, moderate LVH, dilated aortic root, moderate pulmonary HTN. The right ventricular systolic pressure was increased consistent with moderate pulmonary hypertension.   Patient Profile     67 y.o. male  with a history of chronic diastolic heart failure, paroxysmal atrial fibrillation and atrial flutter (s/p cardioversion 06/17/17, on tikosyn and metoprolol), aortic insufficiency, HTN, OSA, dyslipidemia, DM2, was found to have an elevated heart rate, found to be in Aflutter w RVR at his routine PCP visit. He had no awareness of it, no symptoms. Sent to [facility].   Has been on Tikosyn for several years maintaining sinus rhythm with last breakthrough afib in 2016 until found to be in afib in early January. Had DCCV on 06/17/2017, converted to sinus rhythm.   Assessment & Plan    1. Paroxysmal atrial fibrillation -On Tikosyn and metoprolol, found to be in afib and had electrical cardioversion on 06/17/17 to sinus rhythm. Found to be in afib with RVR  incidentally at PCP office on 1/29. He was asymptomatic. Compliant on his meds.  -CHA2DS2/VAS Stroke Risk Score is 3 (CHF, HTN, DM). Is anticoagulated with warfarin. INR on presentation was 1.98, today INR is therapeutic at  2.51. INR has been therapeutic since at least June except for the very slight excursion of 1.98. Pharmacy managing dosing.  -He is continued on Tikosyn. His metoprolol tartrate was switched to succinate (has reduced EF), TOPROL XL 50 mg bid and then increased to 100 mg BID yesterday. Last night's dose of Toprol was held due to HR of 69 on documented VS (likely from dynamap), but tele clearly shows HR in the 100's-120's consistently. BP is a little soft, 108/75. Monitor on today's dosing. Will decrease benazapril to make room in  BP.  -Dr. Sallyanne Kuster discussed case with Dr. Caryl Comes (EP): Rate control remains critical to prevent worsening of tachycardia related cardiomyopathy. Only other antiarrhythmic likely to work would be amiodarone, which he did not tolerate in the past due to severe nausea and vomiting. Discussed with Dr. Caryl Comes.  We will try to add ranolazine 500 mg twice daily to the dofetilide.  Monitor QT interval. Allow roughly 4-5 doses of her nose seen before trying cardioversion again. Is scheduled for DCCV tomorrow at 12:00 with Dr. Harrington Challenger. -Per Dr. Sallyanne Kuster : If this strategy is unsuccessful or unable to tolerate releasing due to excessive QT prolongation, consider invasive ablation. I doubt he will do well with atrial fibrillation ablation.  Although this most recent echocardiogram describes his left atrium has been mildly dilated, my opinion is left atrium is actually moderate to severely dilated, described on echo is from 2014 in 2017.  This will lessen the likelihood of successful ablation. A final option would be AV node ablation with placement of a biventricular pacemaker to do the presence of cardiomyopathy.  This will make him pacemaker dependent. These options were all  briefly discussed with Mr. Parekh.  Obviously, the patient and I both would prefer the least invasive approach first.  -QTC is difficult to measure, hard to discern T wave with his course flutter waves. My best estimate on tele using calipers is  490.  2. Chronic systolic CHF:  -Echo in 6/59 in setting of afib/RVR with EF 30-35%. ?tachy-mediated CMP. Need to keep in NSR and recheck EF. No more than mild volume overload on exam.  - Continue home Lasix.  - Continue benazepril, decrease to allow for room in BP for increased Toprol  3. CKD stage 3 -SCr stable at 1.66  4. Aortic insufficiency: mild to moderate on last echo.  5. OSA: Pt states he uses his CPAP every night "religiously"   For questions or updates, please contact [place] Please consult [email or web address] for contact info under Cardiology/STEMI.      Signed, Daune Perch, NP  07/07/2017, 10:40 AM    I have seen and examined the patient along with Daune Perch, NP .  I have reviewed the chart, notes and new data.  I agree with PA's note.  Key new complaints: no new complaints Key examination changes: ventricular rate 100-110 at rest, 140s walking the hall  PLAN: DC cardioversion tomorrow, after 4 doses of ranolazine.  Sanda Klein, MD, [place] [phone] 07/07/2017, 11:49 AM

## 2017-07-08 ENCOUNTER — Inpatient Hospital Stay (HOSPITAL_COMMUNITY): Payer: Medicare Other | Admitting: Anesthesiology

## 2017-07-08 ENCOUNTER — Encounter (HOSPITAL_COMMUNITY): Payer: Self-pay

## 2017-07-08 ENCOUNTER — Encounter (HOSPITAL_COMMUNITY)
Admission: EM | Disposition: A | Discharge status: Home / Self Care | Payer: Self-pay | Source: Home / Self Care | Attending: Cardiology

## 2017-07-08 DIAGNOSIS — I5043 Acute on chronic combined systolic (congestive) and diastolic (congestive) heart failure: Secondary | ICD-10-CM

## 2017-07-08 DIAGNOSIS — I10 Essential (primary) hypertension: Secondary | ICD-10-CM

## 2017-07-08 HISTORY — PX: CARDIOVERSION: SHX1299

## 2017-07-08 LAB — CBC
HCT: 43.8 % (ref 39.0–52.0)
HEMOGLOBIN: 13.6 g/dL (ref 13.0–17.0)
MCH: 26.3 pg (ref 26.0–34.0)
MCHC: 31.1 g/dL (ref 30.0–36.0)
MCV: 84.6 fL (ref 78.0–100.0)
Platelets: 219 10*3/uL (ref 150–400)
RBC: 5.18 MIL/uL (ref 4.22–5.81)
RDW: 16.5 % — ABNORMAL HIGH (ref 11.5–15.5)
WBC: 3.9 10*3/uL — ABNORMAL LOW (ref 4.0–10.5)

## 2017-07-08 LAB — GLUCOSE, CAPILLARY
Glucose-Capillary: 145 mg/dL — ABNORMAL HIGH (ref 65–99)
Glucose-Capillary: 166 mg/dL — ABNORMAL HIGH (ref 65–99)

## 2017-07-08 LAB — BASIC METABOLIC PANEL
ANION GAP: 11 (ref 5–15)
BUN: 35 mg/dL — AB (ref 6–20)
CHLORIDE: 105 mmol/L (ref 101–111)
CO2: 25 mmol/L (ref 22–32)
Calcium: 9.3 mg/dL (ref 8.9–10.3)
Creatinine, Ser: 1.8 mg/dL — ABNORMAL HIGH (ref 0.61–1.24)
GFR calc Af Amer: 44 mL/min — ABNORMAL LOW (ref >60)
GFR calc non Af Amer: 38 mL/min — ABNORMAL LOW (ref >60)
Glucose, Bld: 139 mg/dL — ABNORMAL HIGH (ref 65–99)
POTASSIUM: 4.6 mmol/L (ref 3.5–5.1)
SODIUM: 141 mmol/L (ref 135–145)

## 2017-07-08 LAB — MAGNESIUM: MAGNESIUM: 2.1 mg/dL (ref 1.7–2.4)

## 2017-07-08 LAB — PROTIME-INR
INR: 2.82
PROTHROMBIN TIME: 29.5 s — AB (ref 11.4–15.2)

## 2017-07-08 SURGERY — CARDIOVERSION
Anesthesia: General

## 2017-07-08 MED ORDER — RANOLAZINE ER 500 MG PO TB12: 500.0000 mg | ORAL_TABLET | Freq: Two times a day (BID) | ORAL | 5 refills | Status: DC

## 2017-07-08 MED ORDER — PROPOFOL 10 MG/ML IV BOLUS
INTRAVENOUS | Status: DC | PRN
  Administered 2017-07-08: 80 mg via INTRAVENOUS

## 2017-07-08 MED ORDER — WARFARIN SODIUM 2 MG PO TABS
2.0000 mg | ORAL_TABLET | Freq: Once | ORAL | Status: DC
  Filled 2017-07-08: qty 1

## 2017-07-08 MED ORDER — FUROSEMIDE 40 MG PO TABS: ORAL_TABLET | ORAL | 3 refills | Status: DC

## 2017-07-08 MED ORDER — BENAZEPRIL HCL 10 MG PO TABS: 10.0000 mg | ORAL_TABLET | Freq: Every day | ORAL | 5 refills | Status: DC

## 2017-07-08 MED ORDER — METOPROLOL SUCCINATE ER 100 MG PO TB24: 100.0000 mg | ORAL_TABLET | Freq: Two times a day (BID) | ORAL | 5 refills | Status: DC

## 2017-07-08 MED ORDER — LIDOCAINE 2% (20 MG/ML) 5 ML SYRINGE
INTRAMUSCULAR | Status: DC | PRN
  Administered 2017-07-08: 40 mg via INTRAVENOUS

## 2017-07-08 NOTE — Interval H&P Note (Signed)
History and Physical Interval Note:  07/08/2017 10:01 AM  [name]  has presented today for surgery, with the diagnosis of afib  The various methods of treatment have been discussed with the patient and family. After consideration of risks, benefits and other options for treatment, the patient has consented to  Procedure(s): CARDIOVERSION (N/A) as a surgical intervention .  The patient's history has been reviewed, patient examined, no change in status, stable for surgery.  I have reviewed the patient's chart and labs.  Questions were answered to the patient's satisfaction.     Dorris Carnes

## 2017-07-08 NOTE — Plan of Care (Signed)
  Coping: Level of anxiety will decrease 07/08/2017 0137 - Progressing by Theador Hawthorne, RN   Pain Managment: General experience of comfort will improve 07/08/2017 0137 - Progressing by Theador Hawthorne, RN   Activity: Risk for activity intolerance will decrease 07/08/2017 0137 - Progressing by Theador Hawthorne, RN   Health Behavior/Discharge Planning: Ability to manage health-related needs will improve 07/08/2017 0137 - Progressing by Theador Hawthorne, RN

## 2017-07-08 NOTE — Consult Note (Signed)
Cardiology Consultation:   Patient ID: [name]; [id number]; [birth year]   Admit date: 07/05/2017 Date of Consult: 07/08/2017  Primary Care Provider: [name], MD Primary Cardiologist: Sanda Klein, MD  Primary Electrophysiologist:  Curt Bears   Patient Profile:   [name] is a 67 y.o. male with a hx of atrial fibrillation, CHF who is being seen today for the evaluation of atrial fibrillation at the request of Mihai Croitrou.  History of Present Illness:   [name] male with history of chronic diastolic heart failure, paroxysmal atrial fibrillation and atrial flutter, aortic insufficiency, hypertension, OSA, hyperlipidemia, and type 2 diabetes.  He presented to the hospital after being seen in his primary physician's office with an elevated heart rate.  EKG confirmed that he was in atrial flutter with rapid rates.  He has been maintained on dofetilide, with Ranexa added this admission.  He did require cardioversion for his atrial arrhythmias.  Currently he is feeling well without major complaints.  In the past, he had atrial fibrillation with rapid rates with a low ejection fraction.  With control of his atrial fibrillation, his ejection fraction recovered.  His main complaint is shortness of breath.  Past Medical History:  Diagnosis Date  . AORTIC STENOSIS, MODERATE 12/05/2009  . Atrial fibrillation ([place])   . Atrial fibrillation with RVR ([place]) 07/05/2017  . Atrial flutter ([place]) 12/05/2009  . Atypical chest pain 11/08/2011  . BENIGN PROSTATIC HYPERTROPHY, HX OF 12/05/2009  . CHEST PAIN-UNSPECIFIED 11/11/2009  . CHF 12/05/2009  . Chronic renal insufficiency 12/16/2016  . Debility 04/18/2012  . Diarrhea 12/16/2016  . DYSPNEA 12/19/2009  . Edema 04/18/2012  . ERECTILE DYSFUNCTION 01/25/2007  . FATIGUE, CHRONIC 11/11/2009  . Gout 07/10/2012  . Heart murmur   . History of kidney stones    "they passed" (07/05/2017)  . Hyperkalemia 04/18/2012  . Hyperlipidemia   . Hypertension     . HYPERTENSION 01/25/2007  . Hypoxia 05/05/2011  . Insomnia 05/13/2016  . INSOMNIA, HX OF 01/25/2007  . Knee pain, bilateral 12/28/2010  . Knee pain, right 12/28/2010  . NEUROMA 01/05/2010  . OBESITY NOS 01/25/2007  . OSA on CPAP 04/06/2010  . PERIPHERAL NEUROPATHY 01/05/2010  . PULMONARY FUNCTION TESTS, ABNORMAL 02/02/2010  . Superficial thrombophlebitis of left leg 09/07/2011  . TESTICULAR HYPOFUNCTION 01/05/2010  . 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 Toe pain, right 07/10/2012  . Type II diabetes mellitus ([place]) 12/05/2009  . UNSPECIFIED ANEMIA 01/05/2010    Past Surgical History:  Procedure Laterality Date  . CARDIAC CATHETERIZATION  10/16/2009   nonischemic cardiomyopathy  . CARDIOVERSION N/A 03/25/2015   Procedure: CARDIOVERSION;  Surgeon: Pixie Casino, MD;  Location: [facility] ENDOSCOPY;  Service: Cardiovascular;  Laterality: N/A;  . CARDIOVERSION N/A 06/17/2017   Procedure: CARDIOVERSION;  Surgeon: Sanda Klein, MD;  Location: [facility] ENDOSCOPY;  Service: Cardiovascular;  Laterality: N/A;  . METATARSAL OSTEOTOMY Bilateral ~ 1980   removed part of 5th metatarsal to corect curvature of toes      Home Medications:  Prior to Admission medications   Medication Sig Start Date End Date Taking? Authorizing Provider  albuterol (VENTOLIN HFA) 108 (90 Base) MCG/ACT inhaler Inhale 2 puffs into the lungs every 6 (six) hours as needed for wheezing or shortness of breath. 12/16/16  Yes [name], MD  allopurinol (ZYLOPRIM) 300 MG tablet TAKE ONE TABLET BY MOUTH ONCE DAILY 01/13/17  Yes [name], MD  calcium carbonate (TUMS - DOSED IN MG ELEMENTAL CALCIUM) 500 MG chewable tablet  Chew 1 tablet by mouth daily as needed. For heartburn   Yes [provider]  cetirizine (ZYRTEC) 10 MG tablet Take 1 tablet (10 mg total) by mouth daily. 11/01/14  Yes [name], MD  Clobetasol Prop Emollient Base (CLOBETASOL PROPIONATE E) 0.05 % emollient cream Apply 1 application topically 2 (two) times daily as needed  (insect bite). 12/16/16  Yes [name], MD  colchicine (COLCRYS) 0.6 MG tablet TAKE ONE TABLET BY MOUTH DAILY AS NEEDED FOR GOUT FLARE UPS. 01/14/16  Yes Croitoru, Mihai, MD  dofetilide (TIKOSYN) 500 MCG capsule Take 1 capsule (500 mcg total) by mouth 2 (two) times daily. Patient taking differently: Take 500 mcg by mouth every 12 (twelve) hours.  04/06/17  Yes Croitoru, Mihai, MD  fish oil-omega-3 fatty acids 1000 MG capsule Take 1 g by mouth daily.    Yes [provider]  furosemide (LASIX) 40 MG tablet TAKE TWO TABLETS BY MOUTH EVERY MORNING AND TAKE ONE TABLET BY MOUTH EVERY AFTERNOON 07/11/17  Yes Rosita Fire, Brittainy M, PA-C  glyBURIDE (DIABETA) 5 MG tablet TAKE 1 TABLET BY MOUTH ONCE DAILY WITH BREAKFAST 09/20/16  Yes [name], MD  KLOR-CON M20 20 MEQ tablet TAKE ONE TABLET BY MOUTH ONCE DAILY AND  EXTRA  TABLET  IF  WEIGHT  IS  ABOVE  210  LBS 12/27/16  Yes Croitoru, Mihai, MD  LORazepam (ATIVAN) 1 MG tablet Take 1 tablet (1 mg total) by mouth every 8 (eight) hours as needed for anxiety. Patient taking differently: Take 0.5 mg by mouth every 8 (eight) hours as needed for anxiety.  08/12/16  Yes [name], MD  magnesium oxide (MAG-OX) 400 MG tablet Take 1 tablet (400 mg total) by mouth daily. 04/18/15  Yes Barrett, Evelene Croon, PA-C  metFORMIN (GLUCOPHAGE) 500 MG tablet TAKE 1 TABLET BY MOUTH THREE TIMES DAILY 05/13/17  Yes [name], MD  Multiple Vitamins-Minerals (MULTIVITAMIN PO) Take 1 tablet by mouth daily.   Yes [provider]  naproxen (NAPROSYN) 375 MG tablet Take 1 tablet (375 mg total) by mouth daily. Patient taking differently: Take 375 mg by mouth daily as needed for mild pain.  05/13/16  Yes [name], MD  pravastatin (PRAVACHOL) 40 MG tablet TAKE ONE TABLET BY MOUTH ONCE DAILY 08/30/16  Yes Troy Sine, MD  Testosterone 10 MG/ACT (2%) GEL Apply 1 application topically daily. Use as directed 04/29/14  Yes [provider]  warfarin  (COUMADIN) 4 MG tablet Take 2-4 mg by mouth See admin instructions. 4mg  on Mon/Wed/Fri 2mg  on Sun/Tues/Thurs/Sat   Yes [provider]  benazepril (LOTENSIN) 10 MG tablet Take 1 tablet (10 mg total) by mouth daily. 07/08/17   Lyda Jester M, PA-C  metoprolol succinate (TOPROL-XL) 100 MG 24 hr tablet Take 1 tablet (100 mg total) by mouth 2 (two) times daily. Take with or immediately following a meal. 07/08/17   Lyda Jester M, PA-C  ranolazine (RANEXA) 500 MG 12 hr tablet Take 1 tablet (500 mg total) by mouth 2 (two) times daily. 07/08/17   Lyda Jester M, PA-C  warfarin (COUMADIN) 4 MG tablet TAKE 1/2 TO 1 (ONE-HALF TO ONE) TABLET BY MOUTH DAILY OR AS DIRECTED BY COUMADIN CLINIC Patient not taking: Reported on 07/05/2017 02/14/17   Croitoru, Dani Gobble, MD    Inpatient Medications: Scheduled Meds: . allopurinol  300 mg Oral Daily  . benazepril  20 mg Oral Daily  . dofetilide  500 mcg Oral BID  .  furosemide  40 mg Oral QPM  . furosemide  80 mg Oral Q breakfast  . insulin aspart  0-15 Units Subcutaneous TID WC  . insulin aspart  0-5 Units Subcutaneous QHS  . magnesium oxide  400 mg Oral Daily  . metoprolol succinate  100 mg Oral BID  . multivitamin with minerals  1 tablet Oral Daily  . omega-3 acid ethyl esters  1 g Oral Daily  . potassium chloride SA  20 mEq Oral Daily  . pravastatin  40 mg Oral Daily  . ranolazine  500 mg Oral BID  . sodium chloride flush  3 mL Intravenous Q12H  . warfarin  2 mg Oral ONCE-1800  . Warfarin - Pharmacist Dosing Inpatient   Does not apply q1800   Continuous Infusions: . sodium chloride     PRN Meds: acetaminophen, albuterol, HYDROcodone-acetaminophen, LORazepam, ondansetron (ZOFRAN) IV, sodium chloride flush, temazepam  Allergies:    Allergies  Allergen Reactions  . Sulfonamide Derivatives Other (See Comments)    unknown reaction.  Patient states had to go to the ER.   [name] Zolpidem Other (See Comments)    Excessive, prolonged sedation     Social History:   Social History   Socioeconomic History  . Marital status: Married    Spouse name: Not on file  . Number of children: Not on file  . Years of education: Not on file  . Highest education level: Not on file  Social Needs  . Financial resource strain: Not on file  . Food insecurity - worry: Not on file  . Food insecurity - inability: Not on file  . Transportation needs - medical: Not on file  . Transportation needs - non-medical: Not on file  Occupational History  . Not on file  Tobacco Use  . Smoking status: Former Smoker    Packs/day: 1.00    Years: 10.00    Pack years: 10.00    Last attempt to quit: 06/07/1980    Years since quitting: 37.1  . Smokeless tobacco: Never Used  Substance and Sexual Activity  . Alcohol use: No  . Drug use: No  . Sexual activity: Not Currently  Other Topics Concern  . Not on file  Social History Narrative  . Not on file    Family History:    Family History  Problem Relation Age of Onset  . Clotting disorder Mother   . Heart disease Mother        s/p MI  . Heart attack Mother   . Hypertension Mother   . Diabetes Mother   . Hyperlipidemia Mother   . Stroke Mother   . Cancer Father        ? lung  . Lung disease Father        smoker  . Diabetes Sister   . Hypertension Sister        smoker  . Leukemia Maternal Grandmother        ?  [name] Aneurysm Sister        brain  . Other Sister        clipped  . Seizures Sister        d/o w/aneurysm/ smoker     ROS:  Please see the history of present illness.  None All other ROS reviewed and negative.     Physical Exam/Data:   Vitals:   07/08/17 1107 07/08/17 1110 07/08/17 1200 07/08/17 1345  BP: 100/66 98/69 92/60  103/66  Pulse: 89 87 86 88  Resp: 17 18  18   Temp: 98.2 F (36.8 C)  98.4 F (36.9 C)   TempSrc: Oral  Oral   SpO2: 100% 100% 100% 97%  Weight:      Height:        Intake/Output Summary (Last 24 hours) at 07/08/2017 1442 Last data filed at 07/08/2017  1349 Gross per 24 hour  Intake 220 ml  Output 1825 ml  Net -1605 ml   Filed Weights   07/07/17 0900 07/08/17 0633 07/08/17 1009  Weight: 213 lb 1.6 oz (96.7 kg) 213 lb 1.6 oz (96.7 kg) 213 lb (96.6 kg)   Body mass index is 29.71 kg/m.  General:  Well nourished, well developed, in no acute distress HEENT: normal Lymph: no adenopathy Neck: no JVD Endocrine:  No thryomegaly Vascular: No carotid bruits; FA pulses 2+ bilaterally without bruits  Cardiac:  normal S1, S2; RRR; no murmur  Lungs:  clear to auscultation bilaterally, no wheezing, rhonchi or rales  Abd: soft, nontender, no hepatomegaly  Ext: no edema Musculoskeletal:  No deformities, BUE and BLE strength normal and equal Skin: warm and dry  Neuro:  CNs 2-12 intact, no focal abnormalities noted Psych:  Normal affect   EKG:  The EKG was personally reviewed and demonstrates: Sinus rhythm, QTc 495 ms Telemetry:  Telemetry was personally reviewed and demonstrates: Sinus rhythm post cardioversion  Relevant CV Studies: TTE 06/16/17 - Left ventricle: The cavity size was normal. There was moderate   concentric hypertrophy. Systolic function was moderately to   severely reduced. The estimated ejection fraction was in the   range of 30% to 35%. Severe diffuse hypokinesis with distinct   regional wall motion abnormalities. There is akinesis of the   apical myocardium. The study is not technically sufficient to   allow evaluation of LV diastolic function. - Aortic valve: There was mild to moderate regurgitation directed   eccentrically in the LVOT and towards the mitral anterior   leaflet. - Aorta: Aortic root dimension: 40 mm (ED). Ascending aortic   diameter: 39 mm (S). - Aortic root: The aortic root was mildly dilated. - Ascending aorta: The ascending aorta was mildly dilated. - Mitral valve: Calcified annulus. Mildly thickened leaflets .   There was moderate regurgitation directed eccentrically and   posteriorly. - Left  atrium: The atrium was mildly dilated. - Right ventricle: The cavity size was mildly dilated. Wall   thickness was normal. - Right atrium: The atrium was mildly dilated. - Pulmonary arteries: PA peak pressure: 43 mm Hg (S).  Laboratory Data:  Chemistry Recent Labs  Lab 07/05/17 1651 07/06/17 0408 07/08/17 0533  NA 137 142 141  K 4.4 4.4 4.6  CL 106 106 105  CO2 24 25 25   GLUCOSE 111* 112* 139*  BUN 33* 29* 35*  CREATININE 1.63* 1.66* 1.80*  CALCIUM 9.8 9.6 9.3  GFRNONAA 43* 42* 38*  GFRAA 50* 49* 44*  ANIONGAP 7 11 11     No results for input(s): PROT, ALBUMIN, AST, ALT, ALKPHOS, BILITOT in the last 168 hours. Hematology Recent Labs  Lab 07/05/17 1651 07/06/17 0408 07/08/17 0533  WBC 5.1 4.7 3.9*  RBC 5.38 5.12 5.18  HGB 14.1 13.6 13.6  HCT 44.6 43.4 43.8  MCV 82.9 84.8 84.6  MCH 26.2 26.6 26.3  MCHC 31.6 31.3 31.1  RDW 17.2* 16.3* 16.5*  PLT 233 231 219   Cardiac Enzymes Recent Labs  Lab 07/05/17 1651  TROPONINI <0.03   No results for input(s): TROPIPOC in the last 168 hours.  BNPNo results for input(s): BNP, PROBNP in the last 168 hours.  DDimer No results for input(s): DDIMER in the last 168 hours.  Radiology/Studies:  Dg Chest 2 View  Result Date: 07/05/2017 CLINICAL DATA:  Atrial fibrillation EXAM: CHEST  2 VIEW COMPARISON:  March 20, 2013 FINDINGS: The heart size and mediastinal contours are stable. The heart size is enlarged. There is no focal infiltrate, pulmonary edema, or pleural effusion. The visualized skeletal structures are unremarkable. IMPRESSION: No active cardiopulmonary disease.  Cardiomegaly. Electronically Signed   By: Abelardo Diesel M.D.   On: 07/05/2017 18:01    Assessment and Plan:   1. Paroxysmal atrial fibrillation: Is currently on dofetilide.  Ranexa recently added per Dr. Olin Pia recommendations.  Been in and out of atrial fibrillation without maintaining sinus rhythm.  Due to his atrial fibrillation and systolic heart failure  that appears to be potentially tachycardia mediated, would benefit from ablation.  I did discuss with him ablation.  Risks and benefits include bleeding, tamponade, heart block, stroke, and damage to surrounding organs.  Since his medications were recently adjusted, he would prefer to see if the current medication regimen works, and it is not with plan for ablation. 2. Chronic systolic heart failure: Recent echo shows an EF of 30-35%.  Has been tachycardia mediated in the past.  No current chest pain.  We L[place] Kinzler work to control atrial fibrillation with medications versus ablation. 3. Obstructive sleep apnea: Reports compliance with CPAP   For questions or updates, please contact [place] Please consult [email or web address] for contact info under Cardiology/STEMI.   Signed, Giulio Bertino Meredith [name], MD  07/08/2017 2:42 PM

## 2017-07-08 NOTE — Anesthesia Preprocedure Evaluation (Signed)
Anesthesia Evaluation  Patient identified by MRN, date of birth, ID band Patient awake    Reviewed: Allergy & Precautions, NPO status , Patient's Chart, lab work & pertinent test results  Airway Mallampati: II  TM Distance: >3 FB Neck ROM: Full    Dental  (+) Teeth Intact, Dental Advisory Given   Pulmonary sleep apnea and Continuous Positive Airway Pressure Ventilation , former smoker,    breath sounds clear to auscultation       Cardiovascular hypertension, Pt. on home beta blockers + Peripheral Vascular Disease and +CHF  + dysrhythmias Atrial Fibrillation + Valvular Problems/Murmurs AS  Rhythm:Irregular Rate:Abnormal     Neuro/Psych Anxiety  Neuromuscular disease    GI/Hepatic negative GI ROS, Neg liver ROS,   Endo/Other  diabetes, Type 2, Oral Hypoglycemic Agents  Renal/GU CRFRenal disease     Musculoskeletal   Abdominal Normal abdominal exam  (+)   Peds  Hematology   Anesthesia Other Findings   Reproductive/Obstetrics                            Lab Results  Component Value Date   WBC 3.9 (L) 07/08/2017   HGB 13.6 07/08/2017   HCT 43.8 07/08/2017   MCV 84.6 07/08/2017   PLT 219 07/08/2017   Lab Results  Component Value Date   INR 2.82 07/08/2017   INR 2.51 07/07/2017   INR 2.13 07/06/2017     Anesthesia Physical Anesthesia Plan  ASA: III  Anesthesia Plan: General   Post-op Pain Management:    Induction: Intravenous  PONV Risk Score and Plan: 2 and Propofol infusion  Airway Management Planned: Natural Airway and Mask  Additional Equipment: None  Intra-op Plan:   Post-operative Plan:   Informed Consent: I have reviewed the patients History and Physical, chart, labs and discussed the procedure including the risks, benefits and alternatives for the proposed anesthesia with the patient or authorized representative who has indicated his/her understanding and acceptance.    Dental advisory given  Plan Discussed with: CRNA  Anesthesia Plan Comments:         Anesthesia Quick Evaluation

## 2017-07-08 NOTE — Discharge Summary (Signed)
Discharge Summary    Patient ID: [name],  MRN: [id number], DOB/AGE: Jun 28, 1950 67 y.o.  Admit date: 07/05/2017 Discharge date: 07/08/2017  Primary Care Provider: Penni Homans A Primary Cardiologist: Sanda Klein, MD  Discharge Diagnoses    Active Problems:   Atrial fibrillation with RVR [facility])   Atrial flutter (HCC)   Allergies Allergies  Allergen Reactions  . Sulfonamide Derivatives Other (See Comments)    unknown reaction.  Patient states had to go to the ER.   [name] Zolpidem Other (See Comments)    Excessive, prolonged sedation    Diagnostic Studies/Procedures    Echocardiogram 06/16/17: Study Conclusions - Left ventricle: The cavity size was normal. There was moderate concentric hypertrophy. Systolic function was moderately to severely reduced. The estimated ejection fraction was in the range of 30% to 35%. Severe diffuse hypokinesis with distinct regional wall motion abnormalities. There is akinesis of the apical myocardium. The study is not technically sufficient to allow evaluation of LV diastolic function. - Aortic valve: There was mild to moderate regurgitation directed eccentrically in the LVOT and towards the mitral anterior leaflet. - Aorta: Aortic root dimension: 40 mm (ED). Ascending aortic diameter: 39 mm (S). - Aortic root: The aortic root was mildly dilated. - Ascending aorta: The ascending aorta was mildly dilated. - Mitral valve: Calcified annulus. Mildly thickened leaflets . There was moderate regurgitation directed eccentrically and posteriorly. - Left atrium: The atrium was mildly dilated. - Right ventricle: The cavity size was mildly dilated. Wall thickness was normal. - Right atrium: The atrium was mildly dilated. - Pulmonary arteries: PA peak pressure: 43 mm Hg (S).  Impressions: - Compared to prior echo, LVF has significantly declined with GLLS of -7%, mild RVE, mild RAE, MAC with mild MR, AVSC,  moderate LVH, dilated aortic root, moderate pulmonary HTN. The right ventricular systolic pressure was increased consistent with moderate pulmonary hypertension.     History of Present Illness     67 y.o.malewith a history of chronic diastolic heart failure, paroxysmal atrial fibrillation and atrial flutter (s/p cardioversion 06/17/17, on tikosyn and metoprolol), intolerant to amiodarone due to side effects, aortic insufficiency, HTN, OSA fully compliant with CPAP, dyslipidemia and T2DM, who was found to be in recurrent Aflutter w RVR at his routine PCP visit. He had no awareness of it, no symptoms. Sent to [facility] ED for admission.   Has been on Tikosyn for several years maintaining sinus rhythm with last breakthrough afib in 2016 until found to be in afib in early January. Had DCCV on 06/17/2017, converted to sinus rhythm.  Hospital Course     Given recurrent atrial fibrillation/ flutter w/ RVR, pt was readmitted for further management. Home Tikosyn was continued along with metoprolol, dose titrated up for added rate control. Ranexa also added as an adjunct to Tikosyn regimen, per EP recommendations. Diltiazem was avoided due to low EF. Coumadin was continued for a/c. Lasix continued for chronic HF. Benazepril also continued but dose was reduced due to need to up titrate his BB for added rate control. Pt ultimately required repeat DCCV, which was performed 07/07/17 and was succesful. QTc post cardioversion was 495 ms. K and Mg both stable. EP also consulted for further recommendations regarding long term antiarrythmic strategy and possible benefit of RF ablation for atrial fibrillation vs other AAD strategies. Pt was seen in consultation by Dr. Curt Bears. Dr. Curt Bears recommended continuation of Tikosyn and Ranexa for now and if he fails this regimen, ablation can be pursed.  After EP consult, pt was seen by Dr. Sallyanne Kuster and felt stable for discharge home. He has recommended Pt be discharged on  metoprolol succinate 100 mg BID and reduce the ACEi dose to 10 mg daily, since rate control is critically important for him. Tikosyn continued and Rx for Ranexa sent to pharmacy. Pt was felt to be slightly hypovolemic (BP a bit soft and SCr increased from 1.6>>1.8). Pt was instructed to hold off diuretic for now, but will need to restart soon. Pt will need close OP f/u to recheck QT interval and will also need f/u BMP to reassess renal function. Pt will hold lasix over the weekend and resume in 2 days. He has post hospital f/u early next week, 07/11/17 with APP. EKG will be obtained to check QTc and BMP to recheck SCr.   Consultants: EP    Discharge Vitals Blood pressure 112/77, pulse 88, temperature 98.5 F (36.9 C), temperature source Oral, resp. rate 19, height 5' 11"  (1.803 m), weight 213 lb (96.6 kg), SpO2 98 %.  Filed Weights   07/07/17 0900 07/08/17 0633 07/08/17 1009  Weight: 213 lb 1.6 oz (96.7 kg) 213 lb 1.6 oz (96.7 kg) 213 lb (96.6 kg)    Labs & Radiologic Studies    CBC Recent Labs    07/06/17 0408 07/08/17 0533  WBC 4.7 3.9*  HGB 13.6 13.6  HCT 43.4 43.8  MCV 84.8 84.6  PLT 231 106   Basic Metabolic Panel Recent Labs    07/05/17 1651 07/06/17 0408 07/08/17 0533  NA 137 142 141  K 4.4 4.4 4.6  CL 106 106 105  CO2 24 25 25   GLUCOSE 111* 112* 139*  BUN 33* 29* 35*  CREATININE 1.63* 1.66* 1.80*  CALCIUM 9.8 9.6 9.3  MG 1.9  --  2.1   Liver Function Tests No results for input(s): AST, ALT, ALKPHOS, BILITOT, PROT, ALBUMIN in the last 72 hours. No results for input(s): LIPASE, AMYLASE in the last 72 hours. Cardiac Enzymes Recent Labs    07/05/17 1651  TROPONINI <0.03   BNP Invalid input(s): POCBNP D-Dimer No results for input(s): DDIMER in the last 72 hours. Hemoglobin A1C No results for input(s): HGBA1C in the last 72 hours. Fasting Lipid Panel No results for input(s): CHOL, HDL, LDLCALC, TRIG, CHOLHDL, LDLDIRECT in the last 72 hours. Thyroid Function  Tests No results for input(s): TSH, T4TOTAL, T3FREE, THYROIDAB in the last 72 hours.  Invalid input(s): FREET3 _____________  Dg Chest 2 View  Result Date: 07/05/2017 CLINICAL DATA:  Atrial fibrillation EXAM: CHEST  2 VIEW COMPARISON:  March 20, 2013 FINDINGS: The heart size and mediastinal contours are stable. The heart size is enlarged. There is no focal infiltrate, pulmonary edema, or pleural effusion. The visualized skeletal structures are unremarkable. IMPRESSION: No active cardiopulmonary disease.  Cardiomegaly. Electronically Signed   By: Abelardo Diesel M.D.   On: 07/05/2017 18:01   Disposition   Pt is being discharged home today in good condition.  Follow-up Plans & Appointments    Follow-up Information    Erlene Quan, PA-C Follow up on 07/11/2017.   Specialties:  Cardiology, Radiology Why:  10:00 AM Contact information: [address] [place] [address] [phone]            Discharge Medications   Allergies as of 07/08/2017      Reactions   Sulfonamide Derivatives Other (See Comments)   unknown reaction.  Patient states had to go to the ER.  Zolpidem Other (See Comments)   Excessive, prolonged sedation      Medication List    STOP taking these medications   metoprolol tartrate 100 MG tablet Commonly known as:  LOPRESSOR   temazepam 30 MG capsule Commonly known as:  RESTORIL     TAKE these medications   albuterol 108 (90 Base) MCG/ACT inhaler Commonly known as:  VENTOLIN HFA Inhale 2 puffs into the lungs every 6 (six) hours as needed for wheezing or shortness of breath.   allopurinol 300 MG tablet Commonly known as:  ZYLOPRIM TAKE ONE TABLET BY MOUTH ONCE DAILY   benazepril 10 MG tablet Commonly known as:  LOTENSIN Take 1 tablet (10 mg total) by mouth daily. What changed:    medication strength  how much to take   calcium carbonate 500 MG chewable tablet Commonly known as:  TUMS - dosed in mg elemental calcium Chew 1  tablet by mouth daily as needed. For heartburn   cetirizine 10 MG tablet Commonly known as:  ZYRTEC Take 1 tablet (10 mg total) by mouth daily.   Clobetasol Prop Emollient Base 0.05 % emollient cream Commonly known as:  CLOBETASOL PROPIONATE E Apply 1 application topically 2 (two) times daily as needed (insect bite).   colchicine 0.6 MG tablet Commonly known as:  COLCRYS TAKE ONE TABLET BY MOUTH DAILY AS NEEDED FOR GOUT FLARE UPS.   dofetilide 500 MCG capsule Commonly known as:  TIKOSYN Take 1 capsule (500 mcg total) by mouth 2 (two) times daily. What changed:  when to take this   fish oil-omega-3 fatty acids 1000 MG capsule Take 1 g by mouth daily.   furosemide 40 MG tablet Commonly known as:  LASIX TAKE TWO TABLETS BY MOUTH EVERY MORNING AND TAKE ONE TABLET BY MOUTH EVERY AFTERNOON Start taking on:  07/11/2017 What changed:  These instructions start on 07/11/2017. If you are unsure what to do until then, ask your doctor or other care provider.   glyBURIDE 5 MG tablet Commonly known as:  DIABETA TAKE 1 TABLET BY MOUTH ONCE DAILY WITH BREAKFAST   KLOR-CON M20 20 MEQ tablet Generic drug:  potassium chloride SA TAKE ONE TABLET BY MOUTH ONCE DAILY AND  EXTRA  TABLET  IF  WEIGHT  IS  ABOVE  210  LBS   LORazepam 1 MG tablet Commonly known as:  ATIVAN Take 1 tablet (1 mg total) by mouth every 8 (eight) hours as needed for anxiety. What changed:  how much to take   magnesium oxide 400 MG tablet Commonly known as:  MAG-OX Take 1 tablet (400 mg total) by mouth daily.   metFORMIN 500 MG tablet Commonly known as:  GLUCOPHAGE TAKE 1 TABLET BY MOUTH THREE TIMES DAILY   metoprolol succinate 100 MG 24 hr tablet Commonly known as:  TOPROL-XL Take 1 tablet (100 mg total) by mouth 2 (two) times daily. Take with or immediately following a meal.   MULTIVITAMIN PO Take 1 tablet by mouth daily.   naproxen 375 MG tablet Commonly known as:  NAPROSYN Take 1 tablet (375 mg total) by mouth  daily. What changed:    when to take this  reasons to take this   pravastatin 40 MG tablet Commonly known as:  PRAVACHOL TAKE ONE TABLET BY MOUTH ONCE DAILY   ranolazine 500 MG 12 hr tablet Commonly known as:  RANEXA Take 1 tablet (500 mg total) by mouth 2 (two) times daily.   Testosterone 10 MG/ACT (2%) Gel Apply 1 application topically  daily. Use as directed   warfarin 4 MG tablet Commonly known as:  COUMADIN Take as directed. If you are unsure how to take this medication, talk to your nurse or doctor. Original instructions:  Take 2-4 mg by mouth See admin instructions. 108m on Mon/Wed/Fri 259mon Sun/Tues/Thurs/Sat   warfarin 4 MG tablet Commonly known as:  COUMADIN Take as directed. If you are unsure how to take this medication, talk to your nurse or doctor. Original instructions:  TAKE 1/2 TO 1 (ONE-HALF TO ONE) TABLET BY MOUTH DAILY OR AS DIRECTED BY COUMADIN CLINIC         Outstanding Labs/Studies   Repeat EKG at office f/u on 07/11/17 to reassess QTc and BMP at time of office f/u to monitor renal function given renal insuffiencey.   Duration of Discharge Encounter   Greater than 30 minutes including physician time.  Signed, BrLyda JesterA-C  07/08/2017, 4:44 PM

## 2017-07-08 NOTE — Anesthesia Postprocedure Evaluation (Signed)
Anesthesia Post Note  Patient: [name]  Procedure(s) Performed: CARDIOVERSION (N/A )     Patient location during evaluation: PACU Anesthesia Type: General Level of consciousness: awake and alert Pain management: pain level controlled Vital Signs Assessment: post-procedure vital signs reviewed and stable Respiratory status: spontaneous breathing, nonlabored ventilation, respiratory function stable and patient connected to nasal cannula oxygen Cardiovascular status: blood pressure returned to baseline and stable Postop Assessment: no apparent nausea or vomiting Anesthetic complications: no    Last Vitals:  Vitals:   07/08/17 1110 07/08/17 1200  BP: 98/69 92/60  Pulse: 87 86  Resp: 18 18  Temp:  36.9 C  SpO2: 100% 100%    Last Pain:  Vitals:   07/08/17 1200  TempSrc: Oral  PainSc: 0-No pain                 Terriona Horlacher EDWARD

## 2017-07-08 NOTE — Interval H&P Note (Signed)
History and Physical Interval Note:  07/08/2017 10:35 AM  [name]  has presented today for surgery, with the diagnosis of afib  The various methods of treatment have been discussed with the patient and family. After consideration of risks, benefits and other options for treatment, the patient has consented to  Procedure(s): CARDIOVERSION (N/A) as a surgical intervention .  The patient's history has been reviewed, patient examined, no change in status, stable for surgery.  I have reviewed the patient's chart and labs.  Questions were answered to the patient's satisfaction.     Dorris Carnes

## 2017-07-08 NOTE — Transfer of Care (Signed)
Immediate Anesthesia Transfer of Care Note  Patient: [name]  Procedure(s) Performed: CARDIOVERSION (N/A )  Patient Location: Endoscopy Unit  Anesthesia Type:General  Level of Consciousness: awake, alert , oriented and patient cooperative  Airway & Oxygen Therapy: Patient Spontanous Breathing and Patient connected to nasal cannula oxygen  Post-op Assessment: Report given to RN, Post -op Vital signs reviewed and stable and Patient moving all extremities X 4  Post vital signs: Reviewed and stable  Last Vitals:  Vitals:   07/08/17 0737 07/08/17 1009  BP: 100/79 (!) 122/94  Pulse: (!) 122 (!) 122  Resp: (!) 22 20  Temp: (!) 36.4 C 36.4 C  SpO2: 100% 99%    Last Pain:  Vitals:   07/08/17 1009  TempSrc: Oral  PainSc:          Complications: No apparent anesthesia complications

## 2017-07-08 NOTE — Progress Notes (Signed)
Patient verbalizes understanding of discharge instructions and changes to medications. Follow up appointments in place.

## 2017-07-08 NOTE — Anesthesia Procedure Notes (Signed)
Procedure Name: General with mask airway Date/Time: 07/08/2017 10:53 AM Performed by: Renato Shin, CRNA Pre-anesthesia Checklist: Patient identified, Emergency Drugs available, Suction available, Patient being monitored and Timeout performed Oxygen Delivery Method: Ambu bag Preoxygenation: Pre-oxygenation with 100% oxygen Induction Type: IV induction Ventilation: Mask ventilation without difficulty Placement Confirmation: positive ETCO2,  CO2 detector and breath sounds checked- equal and bilateral Dental Injury: Teeth and Oropharynx as per pre-operative assessment

## 2017-07-08 NOTE — CV Procedure (Signed)
DC Cardioversion  Patient anesthetized with propofol and lidocaine by anesthesia With pads in AP position, patient cardioverted to SR with 200 J synchronized biphasic energy Procedure without complication 12 lead EKG pending

## 2017-07-08 NOTE — Progress Notes (Signed)
[place] for warfarin Indication: atrial fibrillation  Allergies  Allergen Reactions  . Sulfonamide Derivatives Other (See Comments)    unknown reaction.  Patient states had to go to the ER.   [name] Zolpidem Other (See Comments)    Excessive, prolonged sedation    Patient Measurements: Height: 5\' 11"  (180.3 cm) Weight: 213 lb (96.6 kg) IBW/kg (Calculated) : 75.3  Vital Signs: Temp: 98.4 F (36.9 C) (02/01 1200) Temp Source: Oral (02/01 1200) BP: 92/60 (02/01 1200) Pulse Rate: 86 (02/01 1200)  Labs: Recent Labs    07/05/17 1651 07/06/17 0408 07/07/17 0609 07/08/17 0533  HGB 14.1 13.6  --  13.6  HCT 44.6 43.4  --  43.8  PLT 233 231  --  219  LABPROT 22.3* 23.7* 26.9* 29.5*  INR 1.98 2.13 2.51 2.82  CREATININE 1.63* 1.66*  --  1.80*  TROPONINI <0.03  --   --   --     Estimated Creatinine Clearance: 49.1 mL/min (A) (by C-G formula based on SCr of 1.8 mg/dL (H)).  Assessment: 67 yo f presenting with tachycardia- history of AFib s/p cardioversion on 06/17/17, but back in Afib with no symptoms.  To continue warfarin- home dose is 4mg  MWF, 2mg  all other days.  INR 2.82 this morning. CBC is stable, no bleeding noted.  Patient underwent successful DCCV this morning and is currently in sinus rhythm.   Goal of Therapy:  INR 2-3 Monitor platelets by anticoagulation protocol: Yes   Plan:  Warfarin 2mg  x 1 tonight  Daily INR   Etrulia Zarr D. Adrijana Haros, PharmD, BCPS Clinical Pharmacist Clinical Phone for 07/08/2017 until 3:30pm: [phone] If after 3:30pm, please call main pharmacy at [phone] 07/08/2017 1:22 PM

## 2017-07-08 NOTE — Progress Notes (Signed)
Progress Note  Patient Name: [name] Date of Encounter: 07/08/2017  Primary Cardiologist: Sanda Klein, MD   Subjective   Feeling ok post cardioversion. No symptoms. Eating lunch.   Inpatient Medications    Scheduled Meds: . allopurinol  300 mg Oral Daily  . benazepril  20 mg Oral Daily  . dofetilide  500 mcg Oral BID  . furosemide  40 mg Oral QPM  . furosemide  80 mg Oral Q breakfast  . insulin aspart  0-15 Units Subcutaneous TID WC  . insulin aspart  0-5 Units Subcutaneous QHS  . magnesium oxide  400 mg Oral Daily  . metoprolol succinate  100 mg Oral BID  . multivitamin with minerals  1 tablet Oral Daily  . omega-3 acid ethyl esters  1 g Oral Daily  . potassium chloride SA  20 mEq Oral Daily  . pravastatin  40 mg Oral Daily  . ranolazine  500 mg Oral BID  . sodium chloride flush  3 mL Intravenous Q12H  . Warfarin - Pharmacist Dosing Inpatient   Does not apply q1800   Continuous Infusions: . sodium chloride     PRN Meds: acetaminophen, albuterol, HYDROcodone-acetaminophen, LORazepam, ondansetron (ZOFRAN) IV, sodium chloride flush, temazepam   Vital Signs    Vitals:   07/08/17 1009 07/08/17 1107 07/08/17 1110 07/08/17 1200  BP: (!) 122/94 1_0  Pulse: (!) 122 89 87 86  Resp: _1 Temp: 97.6 F (36.4 C) 98.2 F (36.8 C)  98.4 F (36.9 C)  TempSrc: Oral Oral  Oral  SpO2: 99% 100% 100% 100%  Weight: 213 lb (96.6 kg)     Height: _2  (1.803 m)       Intake/Output Summary (Last 24 hours) at 07/08/2017 1215 Last data filed at 07/08/2017 1102 Gross per 24 hour  Intake 340 ml  Output 1650 ml  Net -1310 ml   Filed Weights   07/07/17 0900 07/08/17 0633 07/08/17 1009  Weight: 213 lb 1.6 oz (96.7 kg) 213 lb 1.6 oz (96.7 kg) 213 lb (96.6 kg)    Telemetry    NSR 95 bpm - Personally Reviewed  ECG    Postop cardioversion NSR - Personally Reviewed  Physical Exam   GEN: No acute distress.   Neck: No JVD Cardiac: RRR, no  murmurs, rubs, or gallops.  Respiratory: Clear to auscultation bilaterally. GI: Soft, nontender, non-distended  MS: No edema; No deformity. Neuro:  Nonfocal  Psych: Normal affect   Labs    Chemistry Recent Labs  Lab 07/05/17 1651 07/06/17 0408 07/08/17 0533  NA 137 142 141  K 4.4 4.4 4.6  CL 106 106 105  CO2 _3 GLUCOSE 111* 112* 139*  BUN 33* 29* 35*  CREATININE 1.63* 1.66* 1.80*  CALCIUM 9.8 9.6 9.3  GFRNONAA 43* 42* 38*  GFRAA 50* 49* 44*  ANIONGAP _4 Hematology Recent Labs  Lab 07/05/17 1651 07/06/17 0408 07/08/17 0533  WBC 5.1 4.7 3.9*  RBC 5.38 5.12 5.18  HGB 14.1 13.6 13.6  HCT 44.6 43.4 43.8  MCV 82.9 84.8 84.6  MCH 26.2 26.6 26.3  MCHC 31.6 31.3 31.1  RDW 17.2* 16.3* 16.5*  PLT 233 231 219    Cardiac Enzymes Recent Labs  Lab 07/05/17 1651  TROPONINI <0.03   No results for input(s): TROPIPOC in the last 168 hours.   BNPNo results for input(s): BNP, PROBNP in the last 168 hours.  DDimer No results for input(s): DDIMER in the last 168 hours.   Radiology    No results found.  Cardiac Studies   Echocardiogram 06/16/17: Study Conclusions - Left ventricle: The cavity size was normal. There was moderate concentric hypertrophy. Systolic function was moderately to severely reduced. The estimated ejection fraction was in the range of 30% to 35%. Severe diffuse hypokinesis with distinct regional wall motion abnormalities. There is akinesis of the apical myocardium. The study is not technically sufficient to allow evaluation of LV diastolic function. - Aortic valve: There was mild to moderate regurgitation directed eccentrically in the LVOT and towards the mitral anterior leaflet. - Aorta: Aortic root dimension: 40 mm (ED). Ascending aortic diameter: 39 mm (S). - Aortic root: The aortic root was mildly dilated. - Ascending aorta: The ascending aorta was mildly dilated. - Mitral valve: Calcified annulus. Mildly  thickened leaflets . There was moderate regurgitation directed eccentrically and posteriorly. - Left atrium: The atrium was mildly dilated. - Right ventricle: The cavity size was mildly dilated. Wall thickness was normal. - Right atrium: The atrium was mildly dilated. - Pulmonary arteries: PA peak pressure: 43 mm Hg (S).  Impressions: - Compared to prior echo, LVF has significantly declined with GLLS of -7%, mild RVE, mild RAE, MAC with mild MR, AVSC, moderate LVH, dilated aortic root, moderate pulmonary HTN. The right ventricular systolic pressure was increased consistent with moderate pulmonary hypertension.   Patient Profile   67 y.o. male with a history of chronic diastolic heart failure, paroxysmal atrial fibrillation and atrial flutter (s/p cardioversion 06/17/17, on tikosyn and metoprolol), aortic insufficiency, HTN, OSA, dyslipidemia, DM2, was found to havean elevated heart rate, found to be in Aflutter w RVR at his routine PCP visit. He had no awareness of it, no symptoms. Sent to [facility].   Has been on Tikosyn for several years maintaining sinus rhythm with last breakthrough afib in 2016 until found to be in afib in early January. Had DCCV on 06/17/2017, converted to sinus rhythm.  Assessment & Plan    1. PAF: successful DCCV earlier today with conversion back to NSR with 200 J synchronized biphasic energy. Continue Tikosyn, metoprolol and coumadin. Ranexa also recently added to regimen, per Dr. Olin Pia recommendations. If unable to maintain NSR, then next step may possibly be ablation. EP to see later today for formal consult. QTC post cardioversion is 495 ms. K is 4.6. Mg 2.1.   2. Chronic Systolic HF: recent echo 06/5398 showed EF of 30-35%. ? Tachy mediated CM due to rapid afib. Now pt is back in NSR, will plan to recheck outpatient 2D Echo in several more weeks to reassess EF. Continue BB. Holding ACE-I today given ARI and hypotension.   3. Aortic Insuffiency:  mild to moderate on recent echo.   4. OSA: pt reports full nightly compliance with CPAP.   5. Soft BP: BP soft upon return back to floor post cardioversion. SBP 92. NPO status and recent sedation may be contributing. Diet has been ordered. RN to monitor and will hold off on giving scheduled lasix and benazepril for now. Continue to monitor. Given metoprolol later once BP improves.   6. Renal Insuffiencey: bump in SCr over the past 2 days, from 1.66>>1.8. BUN also up from 29>>35. Will instruct RN to hold benazepril and Lasix for now until further discussion with MD, given change in renal function, in addition to borderline hypotension.     For questions or updates, please contact [place] Please consult [email or web address] for  contact info under Cardiology/STEMI.      Signed, Lyda Jester, PA-C  07/08/2017, 12:15 PM    I have seen and examined the patient along with Lyda Jester, PA-C .  I have reviewed the chart, notes and new data.  I agree with PA/NP's note.  Key new complaints: successful cardioversion, but so far he cannot tell any change in symptoms Key examination changes: RRR; BP low, holding ACEi and diuretic Key new findings / data: NSR, QTc 495 ms  PLAN: Will ask Dr. Curt Bears to weigh in on antiarrhythmic strategy, including Ranolazine + Tikosyn combo, possible benefit of RF ablation for atrial fibrillation versus other antiarrhythmic strategies. May be ready for DC later today. Will need close follow up of QT interval and renal function. Plan to DC on metoprolol 100 mg BID and reduce the ACEi dose to 10 mg daily, since rate control is critically important for him. May be slightly hypovolemic. Hold off diuretic for now, but will need to restart soon.Sanda Klein, MD, [place] [phone] 07/08/2017, 2:01 PM

## 2017-07-10 ENCOUNTER — Encounter (HOSPITAL_COMMUNITY): Payer: Self-pay | Admitting: Internal Medicine

## 2017-07-11 ENCOUNTER — Ambulatory Visit (INDEPENDENT_AMBULATORY_CARE_PROVIDER_SITE_OTHER): Payer: Medicare Other | Admitting: Cardiology

## 2017-07-11 ENCOUNTER — Encounter: Payer: Self-pay | Admitting: Cardiology

## 2017-07-11 VITALS — BP 124/72 | HR 93 | Ht 71.0 in | Wt 214.0 lb

## 2017-07-11 DIAGNOSIS — I480 Paroxysmal atrial fibrillation: Secondary | ICD-10-CM

## 2017-07-11 DIAGNOSIS — I482 Chronic atrial fibrillation: Secondary | ICD-10-CM | POA: Diagnosis not present

## 2017-07-11 DIAGNOSIS — Z7901 Long term (current) use of anticoagulants: Secondary | ICD-10-CM | POA: Diagnosis not present

## 2017-07-11 DIAGNOSIS — N189 Chronic kidney disease, unspecified: Secondary | ICD-10-CM

## 2017-07-11 DIAGNOSIS — N183 Chronic kidney disease, stage 3 (moderate): Secondary | ICD-10-CM | POA: Diagnosis not present

## 2017-07-11 DIAGNOSIS — I4821 Permanent atrial fibrillation: Secondary | ICD-10-CM

## 2017-07-11 DIAGNOSIS — N179 Acute kidney failure, unspecified: Secondary | ICD-10-CM

## 2017-07-11 DIAGNOSIS — I5042 Chronic combined systolic (congestive) and diastolic (congestive) heart failure: Secondary | ICD-10-CM | POA: Diagnosis not present

## 2017-07-11 DIAGNOSIS — E669 Obesity, unspecified: Secondary | ICD-10-CM

## 2017-07-11 DIAGNOSIS — E1169 Type 2 diabetes mellitus with other specified complication: Secondary | ICD-10-CM | POA: Diagnosis not present

## 2017-07-11 LAB — BASIC METABOLIC PANEL
BUN/Creatinine Ratio: 17 (ref 10–24)
BUN: 28 mg/dL — ABNORMAL HIGH (ref 8–27)
CO2: 20 mmol/L (ref 20–29)
Calcium: 9.7 mg/dL (ref 8.6–10.2)
Chloride: 104 mmol/L (ref 96–106)
Creatinine, Ser: 1.64 mg/dL — ABNORMAL HIGH (ref 0.76–1.27)
GFR calc Af Amer: 50 mL/min/{1.73_m2} — ABNORMAL LOW (ref >59)
GFR calc non Af Amer: 44 mL/min/{1.73_m2} — ABNORMAL LOW (ref >59)
Glucose: 169 mg/dL — ABNORMAL HIGH (ref 65–99)
Potassium: 5 mmol/L (ref 3.5–5.2)
Sodium: 142 mmol/L (ref 134–144)

## 2017-07-11 NOTE — Assessment & Plan Note (Signed)
SCr bumped to 1.8, Lasix held for 2 days, resumed today

## 2017-07-11 NOTE — Assessment & Plan Note (Signed)
Seen today for recurrent PAF- s/p addition of Ranexa and DCCV 07/08/17. He is holding NSR.

## 2017-07-11 NOTE — Progress Notes (Signed)
07/11/2017 [name]   01/31/1951  [id number]  Primary Physician [name], MD Primary Cardiologist: Dr Croitoru/ Dr Curt Bears  HPI:  67 y/o AA male with a history of chronic mixed systolic and diastolic heart failure, paroxysmal atrial fibrillation and atrial flutter (s/p cardioversion 06/17/17, on Tikosyn, Coumadin, and metoprolol), aortic insufficiency, HTN, CRI-3, OSA on C-pap, dyslipidemia, and DM2. He was admitted with recurrent PAF 07/05/17 and was re admitted and seen in consult bt Dr Curt Bears. Ranexa was added to Tikosyn and the pt underwent DCCV 07/08/17 to NSR. He is in the office today for follow up. He says he could not get the Ranexa from the pharmacy after discharge and hasn't taken any since discharged 07/08/17. He has been doing well fortunately and remains in NSR. His Lasix was held for a few doses secondary to acute on chronic kidney injury, he just resumed it today.    Current Outpatient Medications  Medication Sig Dispense Refill  . albuterol (VENTOLIN HFA) 108 (90 Base) MCG/ACT inhaler Inhale 2 puffs into the lungs every 6 (six) hours as needed for wheezing or shortness of breath. 1 Inhaler 6  . allopurinol (ZYLOPRIM) 300 MG tablet TAKE ONE TABLET BY MOUTH ONCE DAILY 90 tablet 2  . benazepril (LOTENSIN) 10 MG tablet Take 1 tablet (10 mg total) by mouth daily. 30 tablet 5  . calcium carbonate (TUMS - DOSED IN MG ELEMENTAL CALCIUM) 500 MG chewable tablet Chew 1 tablet by mouth daily as needed. For heartburn    . cetirizine (ZYRTEC) 10 MG tablet Take 1 tablet (10 mg total) by mouth daily. 30 tablet 11  . Clobetasol Prop Emollient Base (CLOBETASOL PROPIONATE E) 0.05 % emollient cream Apply 1 application topically 2 (two) times daily as needed (insect bite). 60 g 1  . colchicine (COLCRYS) 0.6 MG tablet TAKE ONE TABLET BY MOUTH DAILY AS NEEDED FOR GOUT FLARE UPS. 30 tablet 0  . dofetilide (TIKOSYN) 500 MCG capsule Take 1 capsule (500 mcg total) by mouth 2 (two) times daily.  (Patient taking differently: Take 500 mcg by mouth every 12 (twelve) hours. ) 180 capsule 1  . fish oil-omega-3 fatty acids 1000 MG capsule Take 1 g by mouth daily.     . furosemide (LASIX) 40 MG tablet TAKE TWO TABLETS BY MOUTH EVERY MORNING AND TAKE ONE TABLET BY MOUTH EVERY AFTERNOON 270 tablet 3  . glyBURIDE (DIABETA) 5 MG tablet TAKE 1 TABLET BY MOUTH ONCE DAILY WITH BREAKFAST 90 tablet 0  . KLOR-CON M20 20 MEQ tablet TAKE ONE TABLET BY MOUTH ONCE DAILY AND  EXTRA  TABLET  IF  WEIGHT  IS  ABOVE  210  LBS 180 tablet 2  . LORazepam (ATIVAN) 1 MG tablet Take 1 tablet (1 mg total) by mouth every 8 (eight) hours as needed for anxiety. (Patient taking differently: Take 0.5 mg by mouth every 8 (eight) hours as needed for anxiety. ) 30 tablet 3  . magnesium oxide (MAG-OX) 400 MG tablet Take 1 tablet (400 mg total) by mouth daily. 30 tablet 11  . metFORMIN (GLUCOPHAGE) 500 MG tablet TAKE 1 TABLET BY MOUTH THREE TIMES DAILY 270 tablet 0  . metoprolol succinate (TOPROL-XL) 100 MG 24 hr tablet Take 1 tablet (100 mg total) by mouth 2 (two) times daily. Take with or immediately following a meal. 60 tablet 5  . Multiple Vitamins-Minerals (MULTIVITAMIN PO) Take 1 tablet by mouth daily.    . naproxen (NAPROSYN) 375 MG tablet Take 1 tablet (375  mg total) by mouth daily. (Patient taking differently: Take 375 mg by mouth daily as needed for mild pain. ) 30 tablet 4  . pravastatin (PRAVACHOL) 40 MG tablet TAKE ONE TABLET BY MOUTH ONCE DAILY 90 tablet 3  . ranolazine (RANEXA) 500 MG 12 hr tablet Take 1 tablet (500 mg total) by mouth 2 (two) times daily. 60 tablet 5  . Testosterone 10 MG/ACT (2%) GEL Apply 1 application topically daily. Use as directed    . warfarin (COUMADIN) 4 MG tablet TAKE 1/2 TO 1 (ONE-HALF TO ONE) TABLET BY MOUTH DAILY OR AS DIRECTED BY COUMADIN CLINIC 90 tablet 1  . warfarin (COUMADIN) 4 MG tablet Take 2-4 mg by mouth See admin instructions. 4mg  on Mon/Wed/Fri 2mg  on Sun/Tues/Thurs/Sat     No  current facility-administered medications for this visit.     Allergies  Allergen Reactions  . Sulfonamide Derivatives Other (See Comments)    unknown reaction.  Patient states had to go to the ER.   [name] Zolpidem Other (See Comments)    Excessive, prolonged sedation    Past Medical History:  Diagnosis Date  . AORTIC STENOSIS, MODERATE 12/05/2009  . Atrial fibrillation ([place])   . Atrial fibrillation with RVR ([place]) 07/05/2017  . Atrial flutter ([place]) 12/05/2009  . Atypical chest pain 11/08/2011  . BENIGN PROSTATIC HYPERTROPHY, HX OF 12/05/2009  . CHEST PAIN-UNSPECIFIED 11/11/2009  . CHF 12/05/2009  . Chronic renal insufficiency 12/16/2016  . Debility 04/18/2012  . Diarrhea 12/16/2016  . DYSPNEA 12/19/2009  . Edema 04/18/2012  . ERECTILE DYSFUNCTION 01/25/2007  . FATIGUE, CHRONIC 11/11/2009  . Gout 07/10/2012  . Heart murmur   . History of kidney stones    "they passed" (07/05/2017)  . Hyperkalemia 04/18/2012  . Hyperlipidemia   . Hypertension   . HYPERTENSION 01/25/2007  . Hypoxia 05/05/2011  . Insomnia 05/13/2016  . INSOMNIA, HX OF 01/25/2007  . Knee pain, bilateral 12/28/2010  . Knee pain, right 12/28/2010  . NEUROMA 01/05/2010  . OBESITY NOS 01/25/2007  . OSA on CPAP 04/06/2010  . PERIPHERAL NEUROPATHY 01/05/2010  . PULMONARY FUNCTION TESTS, ABNORMAL 02/02/2010  . Superficial thrombophlebitis of left leg 09/07/2011  . TESTICULAR HYPOFUNCTION 01/05/2010  . 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 Toe pain, right 07/10/2012  . Type II diabetes mellitus ([place]) 12/05/2009  . UNSPECIFIED ANEMIA 01/05/2010    Social History   Socioeconomic History  . Marital status: Married    Spouse name: Not on file  . Number of children: Not on file  . Years of education: Not on file  . Highest education level: Not on file  Social Needs  . Financial resource strain: Not on file  . Food insecurity - worry: Not on file  . Food insecurity - inability: Not on file  . Transportation needs - medical: Not on file  . Transportation  needs - non-medical: Not on file  Occupational History  . Not on file  Tobacco Use  . Smoking status: Former Smoker    Packs/day: 1.00    Years: 10.00    Pack years: 10.00    Last attempt to quit: 06/07/1980    Years since quitting: 37.1  . Smokeless tobacco: Never Used  Substance and Sexual Activity  . Alcohol use: No  . Drug use: No  . Sexual activity: Not Currently  Other Topics Concern  . Not on file  Social History Narrative  . Not on file     Family History  Problem Relation Age of Onset  .  Clotting disorder Mother   . Heart disease Mother        s/p MI  . Heart attack Mother   . Hypertension Mother   . Diabetes Mother   . Hyperlipidemia Mother   . Stroke Mother   . Cancer Father        ? lung  . Lung disease Father        smoker  . Diabetes Sister   . Hypertension Sister        smoker  . Leukemia Maternal Grandmother        ?  [name] Aneurysm Sister        brain  . Other Sister        clipped  . Seizures Sister        d/o w/aneurysm/ smoker     Review of Systems: General: negative for chills, fever, night sweats or weight changes.  Cardiovascular: negative for chest pain, dyspnea on exertion, edema, orthopnea, palpitations, paroxysmal nocturnal dyspnea or shortness of breath Dermatological: negative for rash Respiratory: negative for cough or wheezing Urologic: negative for hematuria Abdominal: negative for nausea, vomiting, diarrhea, bright red blood per rectum, melena, or hematemesis Neurologic: negative for visual changes, syncope, or dizziness All other systems reviewed and are otherwise negative except as noted above.    Blood pressure 124/72, pulse 93, height 5\' 11"  (1.803 m), weight 214 lb (97.1 kg).  General appearance: alert, cooperative and no distress Neck: no carotid bruit and no JVD Lungs: clear to auscultation bilaterally Heart: regular rate and rhythm and 8-4/1 diastolic murmur AOV, LSB Extremities: extremities normal, atraumatic, no  cyanosis or edema Skin: Skin color, texture, turgor normal. No rashes or lesions Neurologic: Grossly normal  EKG NSR, HR 92, QTc 467, occasional PVC  ASSESSMENT AND PLAN:   PAF (paroxysmal atrial fibrillation) (HCC) Seen today for recurrent PAF- s/p addition of Ranexa and DCCV 07/08/17. He is holding NSR.  Chronic combined systolic and diastolic heart failure ([place]) Echo Jan 2019- EF 30-35% with moderate LVH, moderate AI  Diabetes mellitus type 2 in obese Type 2 NIDDM  Acute-on-chronic kidney injury ([place]) SCr bumped to 1.8, Lasix held for 2 days, resumed today  Long term (current) use of anticoagulants Coumadin Rx   PLAN  Check BMP today. Samples of Ranexa provided. He has a f/u echo and OV with Dr Sallyanne Kuster in April.   Kerin Ransom PA-C 07/11/2017 10:33 AM

## 2017-07-11 NOTE — Assessment & Plan Note (Signed)
Echo Jan 2019- EF 30-35% with moderate LVH, moderate AI

## 2017-07-11 NOTE — Assessment & Plan Note (Signed)
Type 2 NIDDM 

## 2017-07-11 NOTE — Patient Instructions (Addendum)
Your physician recommends that you return for lab work TODAY - BMET  Your physician recommends that you schedule a follow-up appointment with Dr. Sallyanne Kuster as scheduled on April 19 @ 8am  Please call our office if you are unable to obtain Ranexa (3 boxes of samples provided)

## 2017-07-11 NOTE — Assessment & Plan Note (Signed)
Coumadin Rx 

## 2017-07-26 ENCOUNTER — Ambulatory Visit (INDEPENDENT_AMBULATORY_CARE_PROVIDER_SITE_OTHER): Payer: Medicare Other | Admitting: Pharmacist Clinician (PhC)/ Clinical Pharmacy Specialist

## 2017-07-26 DIAGNOSIS — I4891 Unspecified atrial fibrillation: Secondary | ICD-10-CM

## 2017-07-26 DIAGNOSIS — Z7901 Long term (current) use of anticoagulants: Secondary | ICD-10-CM

## 2017-07-26 DIAGNOSIS — I480 Paroxysmal atrial fibrillation: Secondary | ICD-10-CM

## 2017-07-26 LAB — POCT INR: INR: 2.2

## 2017-07-26 NOTE — Patient Instructions (Signed)
Description   Continue with 1/2 tablet daily except 1 tablet each Monday, Wednesday and Friday. repeat INR in 6 weeks.  Next INR in 6 weeks

## 2017-08-09 NOTE — Progress Notes (Signed)
Electrophysiology Office Note   Date:  08/10/2017   ID:  Aymen, Widrig 1950-08-18, MRN [id number]  PCP:  [name], MD  Cardiologist:  Croitrou Primary Electrophysiologist:  Torrie Namba Meredith [name], MD    Chief Complaint  Patient presents with  . Follow-up    Persistent Afib/Discuss ablation     History of Present Illness: [name] is a 67 y.o. male who is being seen today for the evaluation of atrial fibrillation at the request of Mihai Croitrou. Presenting today for electrophysiology evaluation.  He has a history of atrial fibrillation, atrial flutter, aortic insufficiency, hypertension, OSA, hyperlipidemia, type 2 diabetes, and diastolic heart failure.  She presented to the hospital in February 2019 with rapid heart rates.  He had been previously on dofetilide, with Ranexa added that admission.  He required cardioversion.   Today, he denies symptoms of palpitations, chest pain, shortness of breath, orthopnea, PND, lower extremity edema, claudication, dizziness, presyncope, syncope, bleeding, or neurologic sequela. The patient is tolerating medications without difficulties.  He has noted no further episodes of atrial fibrillation since his hospitalization.  He   Past Medical History:  Diagnosis Date  . AORTIC STENOSIS, MODERATE 12/05/2009  . Atrial fibrillation ([place])   . Atrial fibrillation with RVR ([place]) 07/05/2017  . Atrial flutter ([place]) 12/05/2009  . Atypical chest pain 11/08/2011  . BENIGN PROSTATIC HYPERTROPHY, HX OF 12/05/2009  . CHEST PAIN-UNSPECIFIED 11/11/2009  . CHF 12/05/2009  . Chronic renal insufficiency 12/16/2016  . Debility 04/18/2012  . Diarrhea 12/16/2016  . DYSPNEA 12/19/2009  . Edema 04/18/2012  . ERECTILE DYSFUNCTION 01/25/2007  . FATIGUE, CHRONIC 11/11/2009  . Gout 07/10/2012  . Heart murmur   . History of kidney stones    "they passed" (07/05/2017)  . Hyperkalemia 04/18/2012  . Hyperlipidemia   . Hypertension   . HYPERTENSION 01/25/2007  . Hypoxia  05/05/2011  . Insomnia 05/13/2016  . INSOMNIA, HX OF 01/25/2007  . Knee pain, bilateral 12/28/2010  . Knee pain, right 12/28/2010  . NEUROMA 01/05/2010  . OBESITY NOS 01/25/2007  . OSA on CPAP 04/06/2010  . PERIPHERAL NEUROPATHY 01/05/2010  . PULMONARY FUNCTION TESTS, ABNORMAL 02/02/2010  . Superficial thrombophlebitis of left leg 09/07/2011  . TESTICULAR HYPOFUNCTION 01/05/2010  . 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 Toe pain, right 07/10/2012  . Type II diabetes mellitus ([place]) 12/05/2009  . UNSPECIFIED ANEMIA 01/05/2010   Past Surgical History:  Procedure Laterality Date  . CARDIAC CATHETERIZATION  10/16/2009   nonischemic cardiomyopathy  . CARDIOVERSION N/A 03/25/2015   Procedure: CARDIOVERSION;  Surgeon: Pixie Casino, MD;  Location: [facility] ENDOSCOPY;  Service: Cardiovascular;  Laterality: N/A;  . CARDIOVERSION N/A 06/17/2017   Procedure: CARDIOVERSION;  Surgeon: Sanda Klein, MD;  Location: [facility] ENDOSCOPY;  Service: Cardiovascular;  Laterality: N/A;  . CARDIOVERSION N/A 07/08/2017   Procedure: CARDIOVERSION;  Surgeon: Fay Records, MD;  Location: [facility];  Service: Cardiovascular;  Laterality: N/A;  . METATARSAL OSTEOTOMY Bilateral ~ 1980   removed part of 5th metatarsal to corect curvature of toes      Current Outpatient Medications  Medication Sig Dispense Refill  . albuterol (VENTOLIN HFA) 108 (90 Base) MCG/ACT inhaler Inhale 2 puffs into the lungs every 6 (six) hours as needed for wheezing or shortness of breath. 1 Inhaler 6  . allopurinol (ZYLOPRIM) 300 MG tablet TAKE ONE TABLET BY MOUTH ONCE DAILY 90 tablet 2  . benazepril (LOTENSIN) 10 MG tablet Take 1 tablet (10 mg total) by mouth daily. [place]  tablet 5  . calcium carbonate (TUMS - DOSED IN MG ELEMENTAL CALCIUM) 500 MG chewable tablet Chew 1 tablet by mouth daily as needed. For heartburn    . cetirizine (ZYRTEC) 10 MG tablet Take 1 tablet (10 mg total) by mouth daily. 30 tablet 11  . Clobetasol Prop Emollient Base (CLOBETASOL PROPIONATE E) 0.05 %  emollient cream Apply 1 application topically 2 (two) times daily as needed (insect bite). 60 g 1  . colchicine (COLCRYS) 0.6 MG tablet TAKE ONE TABLET BY MOUTH DAILY AS NEEDED FOR GOUT FLARE UPS. 30 tablet 0  . dofetilide (TIKOSYN) 500 MCG capsule Take 1 capsule (500 mcg total) by mouth 2 (two) times daily. (Patient taking differently: Take 500 mcg by mouth every 12 (twelve) hours. ) 180 capsule 1  . fish oil-omega-3 fatty acids 1000 MG capsule Take 1 g by mouth daily.     . furosemide (LASIX) 40 MG tablet TAKE TWO TABLETS BY MOUTH EVERY MORNING AND TAKE ONE TABLET BY MOUTH EVERY AFTERNOON 270 tablet 3  . glyBURIDE (DIABETA) 5 MG tablet TAKE 1 TABLET BY MOUTH ONCE DAILY WITH BREAKFAST 90 tablet 0  . KLOR-CON M20 20 MEQ tablet TAKE ONE TABLET BY MOUTH ONCE DAILY AND  EXTRA  TABLET  IF  WEIGHT  IS  ABOVE  210  LBS 180 tablet 2  . LORazepam (ATIVAN) 1 MG tablet Take 1 tablet (1 mg total) by mouth every 8 (eight) hours as needed for anxiety. (Patient taking differently: Take 0.5 mg by mouth every 8 (eight) hours as needed for anxiety. ) 30 tablet 3  . magnesium oxide (MAG-OX) 400 MG tablet Take 1 tablet (400 mg total) by mouth daily. 30 tablet 11  . metFORMIN (GLUCOPHAGE) 500 MG tablet TAKE 1 TABLET BY MOUTH THREE TIMES DAILY 270 tablet 0  . metoprolol succinate (TOPROL-XL) 100 MG 24 hr tablet Take 1 tablet (100 mg total) by mouth 2 (two) times daily. Take with or immediately following a meal. 60 tablet 5  . Multiple Vitamins-Minerals (MULTIVITAMIN PO) Take 1 tablet by mouth daily.    . naproxen (NAPROSYN) 375 MG tablet Take 1 tablet (375 mg total) by mouth daily. (Patient taking differently: Take 375 mg by mouth daily as needed for mild pain. ) 30 tablet 4  . pravastatin (PRAVACHOL) 40 MG tablet TAKE ONE TABLET BY MOUTH ONCE DAILY 90 tablet 3  . ranolazine (RANEXA) 500 MG 12 hr tablet Take 1 tablet (500 mg total) by mouth 2 (two) times daily. 60 tablet 5  . Testosterone 10 MG/ACT (2%) GEL Apply 1  application topically daily. Use as directed    . warfarin (COUMADIN) 4 MG tablet TAKE 1/2 TO 1 (ONE-HALF TO ONE) TABLET BY MOUTH DAILY OR AS DIRECTED BY COUMADIN CLINIC 90 tablet 1  . warfarin (COUMADIN) 4 MG tablet Take 2-4 mg by mouth See admin instructions. 4mg  on Mon/Wed/Fri 2mg  on Sun/Tues/Thurs/Sat     No current facility-administered medications for this visit.     Allergies:   Sulfonamide derivatives and Zolpidem   Social History:  The patient  reports that he quit smoking about 37 years ago. He has a 10.00 pack-year smoking history. he has never used smokeless tobacco. He reports that he does not drink alcohol or use drugs.   Family History:  The patient's family history includes Aneurysm in his sister; Cancer in his father; Clotting disorder in his mother; Diabetes in his mother and sister; Heart attack in his mother; Heart disease in his mother;  Hyperlipidemia in his mother; Hypertension in his mother and sister; Leukemia in his maternal grandmother; Lung disease in his father; Other in his sister; Seizures in his sister; Stroke in his mother.    ROS:  Please see the history of present illness.   Otherwise, review of systems is positive for fatigue, palpitations, dyspnea on exertion.   All other systems are reviewed and negative.    PHYSICAL EXAM: VS:  BP 102/66   Pulse 68   Ht 5\' 11"  (1.803 m)   Wt 211 lb (95.7 kg)   BMI 29.43 kg/m  , BMI Body mass index is 29.43 kg/m. GEN: Well nourished, well developed, in no acute distress  HEENT: normal  Neck: no JVD, carotid bruits, or masses Cardiac: RRR; no murmurs, rubs, or gallops,no edema  Respiratory:  clear to auscultation bilaterally, normal work of breathing GI: soft, nontender, nondistended, + BS MS: no deformity or atrophy  Skin: warm and dr Neuro:  Strength and sensation are intact Psych: euthymic mood, full affect  EKG:  EKG is not ordered today. Personal review of the ekg ordered  07/11/17 shows sinus rhythm, PVCs,  rate 93, prolonged QTC 467 ms  Recent Labs: 03/14/2017: TSH 1.89 03/18/2017: ALT 12 07/08/2017: Hemoglobin 13.6; Magnesium 2.1; Platelets 219 07/11/2017: BUN 28; Creatinine, Ser 1.64; Potassium 5.0; Sodium 142    Lipid Panel     Component Value Date/Time   CHOL 134 03/14/2017 1126   TRIG 158.0 (H) 03/14/2017 1126   HDL 31.60 (L) 03/14/2017 1126   CHOLHDL 4 03/14/2017 1126   VLDL 31.6 03/14/2017 1126   LDLCALC 71 03/14/2017 1126     Wt Readings from Last 3 Encounters:  08/10/17 211 lb (95.7 kg)  07/11/17 214 lb (97.1 kg)  07/08/17 213 lb (96.6 kg)      Other studies Reviewed: Additional studies/ records that were reviewed today include: TTE 06/16/17  Review of the above records today demonstrates:  - Left ventricle: The cavity size was normal. There was moderate   concentric hypertrophy. Systolic function was moderately to   severely reduced. The estimated ejection fraction was in the   range of 30% to 35%. Severe diffuse hypokinesis with distinct   regional wall motion abnormalities. There is akinesis of the   apical myocardium. The study is not technically sufficient to   allow evaluation of LV diastolic function. - Aortic valve: There was mild to moderate regurgitation directed   eccentrically in the LVOT and towards the mitral anterior   leaflet. - Aorta: Aortic root dimension: 40 mm (ED). Ascending aortic   diameter: 39 mm (S). - Aortic root: The aortic root was mildly dilated. - Ascending aorta: The ascending aorta was mildly dilated. - Mitral valve: Calcified annulus. Mildly thickened leaflets .   There was moderate regurgitation directed eccentrically and   posteriorly. - Left atrium: The atrium was mildly dilated. - Right ventricle: The cavity size was mildly dilated. Wall   thickness was normal. - Right atrium: The atrium was mildly dilated. - Pulmonary arteries: PA peak pressure: 43 mm Hg (S).   ASSESSMENT AND PLAN:  1.  Paroxysmal atrial fibrillation:  Currently on dofetilide and Ranexa.  Warfarin for anticoagulation.  No further episodes of atrial fibrillation since his hospitalization.  He would like to continue to monitor this on his current medication regimen.  Should he go back out of rhythm, would be amenable to ablation.  This patients CHA2DS2-VASc Score and unadjusted Ischemic Stroke Rate (% per year) is equal to  3.2 % stroke rate/year from a score of 3  Above score calculated as 1 point each if present [CHF, HTN, DM, Vascular=MI/PAD/Aortic Plaque, Age if 65-74, or Male] Above score calculated as 2 points each if present [Age > 75, or Stroke/TIA/TE]  2.  Chronic systolic and diastolic heart failure: Recent echo shows an EF of 30-35%.  Fraction was previously normal.  He has a repeat echo scheduled for April.  Should his ejection fraction remains low, he may benefit from an ICD  3.  Hypertension: Blood pressure well controlled  Current medicines are reviewed at length with the patient today.   The patient does not have concerns regarding his medicines.  The following changes were made today:  none  Labs/ tests ordered today include:  No orders of the defined types were placed in this encounter.    Disposition:   FU with Icyss Skog 3 months  Signed, Dennys Guin Meredith [name], MD  08/10/2017 10:43 AM     [facility] [address] [place] [address] [phone] (office) [phone] (fax)

## 2017-08-10 ENCOUNTER — Encounter: Payer: Self-pay | Admitting: Cardiology

## 2017-08-10 ENCOUNTER — Ambulatory Visit (INDEPENDENT_AMBULATORY_CARE_PROVIDER_SITE_OTHER): Payer: Medicare Other | Admitting: Cardiology

## 2017-08-10 VITALS — BP 102/66 | HR 68 | Ht 71.0 in | Wt 211.0 lb

## 2017-08-10 DIAGNOSIS — I10 Essential (primary) hypertension: Secondary | ICD-10-CM

## 2017-08-10 DIAGNOSIS — I480 Paroxysmal atrial fibrillation: Secondary | ICD-10-CM | POA: Diagnosis not present

## 2017-08-10 DIAGNOSIS — I428 Other cardiomyopathies: Secondary | ICD-10-CM

## 2017-08-10 NOTE — Patient Instructions (Signed)
Medication Instructions:    Your physician recommends that you continue on your current medications as directed. Please refer to the Current Medication list given to you today.  - If you need a refill on your cardiac medications before your next appointment, please call your pharmacy.   Labwork:  None ordered  Testing/Procedures:  None ordered  Follow-Up:  Your physician recommends that you schedule a follow-up appointment in: 3 months with Dr. Camnitz.  Thank you for choosing [facility]!!   Susa Bones, RN [phone]         

## 2017-08-14 ENCOUNTER — Other Ambulatory Visit: Payer: Self-pay | Admitting: Family Medicine

## 2017-08-14 ENCOUNTER — Other Ambulatory Visit: Payer: Self-pay | Admitting: Cardiovascular Disease

## 2017-08-25 DIAGNOSIS — R972 Elevated prostate specific antigen [PSA]: Secondary | ICD-10-CM | POA: Diagnosis not present

## 2017-08-29 DIAGNOSIS — R972 Elevated prostate specific antigen [PSA]: Secondary | ICD-10-CM | POA: Diagnosis not present

## 2017-08-29 DIAGNOSIS — N5201 Erectile dysfunction due to arterial insufficiency: Secondary | ICD-10-CM | POA: Diagnosis not present

## 2017-08-29 DIAGNOSIS — E291 Testicular hypofunction: Secondary | ICD-10-CM | POA: Diagnosis not present

## 2017-09-14 ENCOUNTER — Other Ambulatory Visit: Payer: Self-pay

## 2017-09-14 ENCOUNTER — Ambulatory Visit (HOSPITAL_COMMUNITY): Payer: Medicare Other | Attending: Internal Medicine

## 2017-09-14 DIAGNOSIS — I5032 Chronic diastolic (congestive) heart failure: Secondary | ICD-10-CM | POA: Insufficient documentation

## 2017-09-14 DIAGNOSIS — E785 Hyperlipidemia, unspecified: Secondary | ICD-10-CM | POA: Insufficient documentation

## 2017-09-14 DIAGNOSIS — E1140 Type 2 diabetes mellitus with diabetic neuropathy, unspecified: Secondary | ICD-10-CM | POA: Diagnosis not present

## 2017-09-14 DIAGNOSIS — R079 Chest pain, unspecified: Secondary | ICD-10-CM | POA: Insufficient documentation

## 2017-09-14 DIAGNOSIS — I110 Hypertensive heart disease with heart failure: Secondary | ICD-10-CM | POA: Diagnosis not present

## 2017-09-14 DIAGNOSIS — I4891 Unspecified atrial fibrillation: Secondary | ICD-10-CM | POA: Insufficient documentation

## 2017-09-14 DIAGNOSIS — G4733 Obstructive sleep apnea (adult) (pediatric): Secondary | ICD-10-CM | POA: Insufficient documentation

## 2017-09-14 DIAGNOSIS — R011 Cardiac murmur, unspecified: Secondary | ICD-10-CM | POA: Insufficient documentation

## 2017-09-21 ENCOUNTER — Telehealth: Payer: Self-pay | Admitting: Cardiovascular Disease

## 2017-09-21 NOTE — Telephone Encounter (Signed)
New Message:    Please call,pt says he is confused about how he is supposed to be taking his Benazepril.

## 2017-09-21 NOTE — Telephone Encounter (Signed)
Spoke with Pt who reports he received a refill of Benazepril 40 mg from the pharmacy. Pt was informed that he was currently suppose to be taking 10 mg. Spoke with [facility] who reports they filled an old prescription but had the 10 mg on file and they would update there system. Pt states he has been taking the 40 mg for 3 days. He has not taken his BP but doesn't report any dizziness or light headiness. Pt instructed to restart 10 mg tablet.

## 2017-09-23 ENCOUNTER — Ambulatory Visit (INDEPENDENT_AMBULATORY_CARE_PROVIDER_SITE_OTHER): Payer: Medicare Other | Admitting: Cardiovascular Disease

## 2017-09-23 ENCOUNTER — Encounter: Payer: Self-pay | Admitting: Cardiovascular Disease

## 2017-09-23 ENCOUNTER — Other Ambulatory Visit: Payer: Self-pay | Admitting: Family Medicine

## 2017-09-23 ENCOUNTER — Ambulatory Visit (INDEPENDENT_AMBULATORY_CARE_PROVIDER_SITE_OTHER): Payer: Medicare Other | Admitting: Pharmacist

## 2017-09-23 VITALS — BP 111/66 | HR 72 | Ht 71.0 in | Wt 212.4 lb

## 2017-09-23 DIAGNOSIS — Z7901 Long term (current) use of anticoagulants: Secondary | ICD-10-CM

## 2017-09-23 DIAGNOSIS — Z79899 Other long term (current) drug therapy: Secondary | ICD-10-CM

## 2017-09-23 DIAGNOSIS — Z5181 Encounter for therapeutic drug level monitoring: Secondary | ICD-10-CM | POA: Diagnosis not present

## 2017-09-23 DIAGNOSIS — I480 Paroxysmal atrial fibrillation: Secondary | ICD-10-CM | POA: Diagnosis not present

## 2017-09-23 DIAGNOSIS — I351 Nonrheumatic aortic (valve) insufficiency: Secondary | ICD-10-CM

## 2017-09-23 DIAGNOSIS — E663 Overweight: Secondary | ICD-10-CM | POA: Diagnosis not present

## 2017-09-23 DIAGNOSIS — I10 Essential (primary) hypertension: Secondary | ICD-10-CM | POA: Diagnosis not present

## 2017-09-23 DIAGNOSIS — G4733 Obstructive sleep apnea (adult) (pediatric): Secondary | ICD-10-CM | POA: Diagnosis not present

## 2017-09-23 DIAGNOSIS — I5042 Chronic combined systolic (congestive) and diastolic (congestive) heart failure: Secondary | ICD-10-CM

## 2017-09-23 LAB — POCT INR: INR: 3.3

## 2017-09-23 MED ORDER — BENAZEPRIL HCL 10 MG PO TABS: 10.0000 mg | ORAL_TABLET | Freq: Every day | ORAL | 3 refills | Status: DC

## 2017-09-23 NOTE — Progress Notes (Signed)
Cardiology Office Note    Date:  09/23/2017   ID:  Romani, Wilbon [birth year], MRN [id number]  PCP:  [name], MD  Cardiologist:   Sanda Klein, MD   Chief Complaint  Patient presents with  . Follow-up    echo    History of Present Illness:  [name] is a 67 y.o. male with aortic insufficiency, diastolic heart failure, paroxysmal atrial fibrillation and atrial flutter, obstructive sleep apnea, dyslipidemia in the setting of obesity and type 2 diabetes mellitus.  He presented in late January with recurrent heart failure decompensation due to protracted atrial fibrillation rapid ventricular response and tachycardia related cardiomyopathy.  EF had decreased to 30-35%.  He underwent cardioversion on January 11 and has maintained sinus rhythm since.  Ranolazine was added to dofetilide to help maintenance of sinus rhythm he is clinically improved and does not have any complaints of dyspnea at rest or with activity.  Repeat echocardiogram performed.  Earlier this month showed EF has increased to 42%.  He has NYHA functional class I-2 status.  He has no evidence of edema.  He has worked diligently to lose weight and is no longer obese.  His blood pressure is lower than in the past.  He is trying to gradually get down to a weight of 195 pounds by his birthday in [birth year].  As always has difficulty falling asleep.  He is 100% compliant with CPAP, but has difficulty winding down in the evenings.  He takes a benzodiazepine on a regular basis.  He is on warfarin anticoagulation and is consistently therapeutic.  He has not had any bleeding complications and denies any focal neurological problems.  All his echocardiograms have described moderate aortic insufficiency due to aortic root ectasia it has never appear to be severe.  The patient specifically denies any chest pain at rest or usual exertion, dyspnea at rest or with usual exertion, orthopnea, paroxysmal nocturnal dyspnea,  syncope, palpitations, focal neurological deficits, intermittent claudication, lower extremity edema, unexplained weight gain, cough, hemoptysis or wheezing.  His echocardiograms have shown aortic insufficiency related to aortic he has a long-standing history of severe hypertension which has not always been well treated. He also has long-standing history of obstructive sleep apnea which was also not treated until recently, but he is now 100% CPAP-compliant. Coronary angiography 2011 did not show evidence of significant stenoses. He presented with typical atrial flutter in 2011 and has recurrent paroxysmal atrial fibrillation with a good response to treatment with dofetilide. Additional problems include obesity, type 2 diabetes mellitus, gout and androgen deficiency. He had a successful cardioversion on March 25 2015 for persistent atrial fibrillation.   Past Medical History:  Diagnosis Date  . AORTIC STENOSIS, MODERATE 12/05/2009  . Atrial fibrillation ([place])   . Atrial fibrillation with RVR ([place]) 07/05/2017  . Atrial flutter ([place]) 12/05/2009  . Atypical chest pain 11/08/2011  . BENIGN PROSTATIC HYPERTROPHY, HX OF 12/05/2009  . CHEST PAIN-UNSPECIFIED 11/11/2009  . CHF 12/05/2009  . Chronic renal insufficiency 12/16/2016  . Debility 04/18/2012  . Diarrhea 12/16/2016  . DYSPNEA 12/19/2009  . Edema 04/18/2012  . ERECTILE DYSFUNCTION 01/25/2007  . FATIGUE, CHRONIC 11/11/2009  . Gout 07/10/2012  . Heart murmur   . History of kidney stones    "they passed" (07/05/2017)  . Hyperkalemia 04/18/2012  . Hyperlipidemia   . Hypertension   . HYPERTENSION 01/25/2007  . Hypoxia 05/05/2011  . Insomnia 05/13/2016  . INSOMNIA, HX OF 01/25/2007  . Knee pain,  bilateral 12/28/2010  . Knee pain, right 12/28/2010  . NEUROMA 01/05/2010  . OBESITY NOS 01/25/2007  . OSA on CPAP 04/06/2010  . PERIPHERAL NEUROPATHY 01/05/2010  . PULMONARY FUNCTION TESTS, ABNORMAL 02/02/2010  . Superficial thrombophlebitis of left leg 09/07/2011  .  TESTICULAR HYPOFUNCTION 01/05/2010  . 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 Toe pain, right 07/10/2012  . Type II diabetes mellitus ([place]) 12/05/2009  . UNSPECIFIED ANEMIA 01/05/2010    Past Surgical History:  Procedure Laterality Date  . CARDIAC CATHETERIZATION  10/16/2009   nonischemic cardiomyopathy  . CARDIOVERSION N/A 03/25/2015   Procedure: CARDIOVERSION;  Surgeon: Pixie Casino, MD;  Location: [facility]COPY;  Service: Cardiovascular;  Laterality: N/A;  . CARDIOVERSION N/A 06/17/2017   Procedure: CARDIOVERSION;  Surgeon: Sanda Klein, MD;  Location: [facility];  Service: Cardiovascular;  Laterality: N/A;  . CARDIOVERSION N/A 07/08/2017   Procedure: CARDIOVERSION;  Surgeon: Fay Records, MD;  Location: [facility];  Service: Cardiovascular;  Laterality: N/A;  . METATARSAL OSTEOTOMY Bilateral ~ 1980   removed part of 5th metatarsal to corect curvature of toes     Current Medications: Outpatient Medications Prior to Visit  Medication Sig Dispense Refill  . albuterol (VENTOLIN HFA) 108 (90 Base) MCG/ACT inhaler Inhale 2 puffs into the lungs every 6 (six) hours as needed for wheezing or shortness of breath. 1 Inhaler 6  . allopurinol (ZYLOPRIM) 300 MG tablet TAKE ONE TABLET BY MOUTH ONCE DAILY 90 tablet 2  . calcium carbonate (TUMS - DOSED IN MG ELEMENTAL CALCIUM) 500 MG chewable tablet Chew 1 tablet by mouth daily as needed. For heartburn    . cetirizine (ZYRTEC) 10 MG tablet Take 1 tablet (10 mg total) by mouth daily. 30 tablet 11  . Clobetasol Prop Emollient Base (CLOBETASOL PROPIONATE E) 0.05 % emollient cream Apply 1 application topically 2 (two) times daily as needed (insect bite). 60 g 1  . colchicine (COLCRYS) 0.6 MG tablet TAKE ONE TABLET BY MOUTH DAILY AS NEEDED FOR GOUT FLARE UPS. 30 tablet 0  . dofetilide (TIKOSYN) 500 MCG capsule Take 1 capsule (500 mcg total) by mouth 2 (two) times daily. (Patient taking differently: Take 500 mcg by mouth every 12 (twelve) hours. ) 180 capsule 1  .  fish oil-omega-3 fatty acids 1000 MG capsule Take 1 g by mouth daily.     . furosemide (LASIX) 40 MG tablet TAKE TWO TABLETS BY MOUTH EVERY MORNING AND TAKE ONE TABLET BY MOUTH EVERY AFTERNOON 270 tablet 3  . glyBURIDE (DIABETA) 5 MG tablet TAKE 1 TABLET BY MOUTH ONCE DAILY WITH BREAKFAST 90 tablet 0  . KLOR-CON M20 20 MEQ tablet TAKE ONE TABLET BY MOUTH ONCE DAILY AND  EXTRA  TABLET  IF  WEIGHT  IS  ABOVE  210  LBS 180 tablet 2  . LORazepam (ATIVAN) 1 MG tablet Take 1 tablet (1 mg total) by mouth every 8 (eight) hours as needed for anxiety. (Patient taking differently: Take 0.5 mg by mouth every 8 (eight) hours as needed for anxiety. ) 30 tablet 3  . magnesium oxide (MAG-OX) 400 MG tablet Take 1 tablet (400 mg total) by mouth daily. 30 tablet 11  . metFORMIN (GLUCOPHAGE) 500 MG tablet TAKE 1 TABLET BY MOUTH THREE TIMES DAILY 270 tablet 0  . metoprolol succinate (TOPROL-XL) 100 MG 24 hr tablet Take 1 tablet (100 mg total) by mouth 2 (two) times daily. Take with or immediately following a meal. 60 tablet 5  . Multiple Vitamins-Minerals (MULTIVITAMIN PO) Take 1 tablet  by mouth daily.    . naproxen (NAPROSYN) 375 MG tablet Take 1 tablet (375 mg total) by mouth daily. (Patient taking differently: Take 375 mg by mouth daily as needed for mild pain. ) 30 tablet 4  . pravastatin (PRAVACHOL) 40 MG tablet TAKE 1 TABLET BY MOUTH ONCE DAILY 90 tablet 3  . ranolazine (RANEXA) 500 MG 12 hr tablet Take 1 tablet (500 mg total) by mouth 2 (two) times daily. 60 tablet 5  . Testosterone 10 MG/ACT (2%) GEL Apply 1 application topically daily. Use as directed    . warfarin (COUMADIN) 4 MG tablet Take 2-4 mg by mouth See admin instructions. 4mg  on Mon/Wed/Fri 2mg  on Sun/Tues/Thurs/Sat    . warfarin (COUMADIN) 4 MG tablet TAKE 1/2 TO 1 (ONE-HALF TO ONE) TABLET BY MOUTH DAILY OR AS DIRECTED BY COUMADIN CLINIC 90 tablet 1  . benazepril (LOTENSIN) 10 MG tablet Take 1 tablet (10 mg total) by mouth daily. 30 tablet 5   No  facility-administered medications prior to visit.      Allergies:   Sulfonamide derivatives and Zolpidem   Social History   Socioeconomic History  . Marital status: Married    Spouse name: Not on file  . Number of children: Not on file  . Years of education: Not on file  . Highest education level: Not on file  Occupational History  . Not on file  Social Needs  . Financial resource strain: Not on file  . Food insecurity:    Worry: Not on file    Inability: Not on file  . Transportation needs:    Medical: Not on file    Non-medical: Not on file  Tobacco Use  . Smoking status: Former Smoker    Packs/day: 1.00    Years: 10.00    Pack years: 10.00    Last attempt to quit: 06/07/1980    Years since quitting: 37.3  . Smokeless tobacco: Never Used  Substance and Sexual Activity  . Alcohol use: No  . Drug use: No  . Sexual activity: Not Currently  Lifestyle  . Physical activity:    Days per week: Not on file    Minutes per session: Not on file  . Stress: Not on file  Relationships  . Social connections:    Talks on phone: Not on file    Gets together: Not on file    Attends religious service: Not on file    Active member of club or organization: Not on file    Attends meetings of clubs or organizations: Not on file    Relationship status: Not on file  Other Topics Concern  . Not on file  Social History Narrative  . Not on file     Family History:  The patient's family history includes Aneurysm in his sister; Cancer in his father; Clotting disorder in his mother; Diabetes in his mother and sister; Heart attack in his mother; Heart disease in his mother; Hyperlipidemia in his mother; Hypertension in his mother and sister; Leukemia in his maternal grandmother; Lung disease in his father; Other in his sister; Seizures in his sister; Stroke in his mother.   ROS:   Please see the history of present illness.    ROS All other systems reviewed and are negative.   PHYSICAL  EXAM:   VS:  BP 111/66   Pulse 72   Ht 5\' 11"  (1.803 m)   Wt 212 lb 6.4 oz (96.3 kg)   BMI 29.62 kg/m  General: Alert, oriented x3, no distress, overweight Head: no evidence of trauma, PERRL, EOMI, no exophtalmos or lid lag, no myxedema, no xanthelasma; normal ears, nose and oropharynx Neck: normal jugular venous pulsations and no hepatojugular reflux; brisk carotid pulses without delay and no carotid bruits Chest: clear to auscultation, no signs of consolidation by percussion or palpation, normal fremitus, symmetrical and full respiratory excursions Cardiovascular: normal position and quality of the apical impulse, regular rhythm, normal first and second heart sounds, 1-2/6 aortic ejection murmur, early peaking, 3/6 decrescendo diastolic murmur heard best at the left lower sternal border,no rubs or gallops Abdomen: no tenderness or distention, no masses by palpation, no abnormal pulsatility or arterial bruits, normal bowel sounds, no hepatosplenomegaly Extremities: no clubbing, cyanosis or edema; 2+ radial, ulnar and brachial pulses bilaterally; 2+ right femoral, posterior tibial and dorsalis pedis pulses; 2+ left femoral, posterior tibial and dorsalis pedis pulses; no subclavian or femoral bruits Neurological: grossly nonfocal Psych: Normal mood and affect    Wt Readings from Last 3 Encounters:  09/23/17 212 lb 6.4 oz (96.3 kg)  08/10/17 211 lb (95.7 kg)  07/11/17 214 lb (97.1 kg)   Studies/Labs Reviewed:   EKG:  EKG is ordered today.  The ekg ordered today demonstrates sinus rhythm with first-degree AV block (PR 222 ms), generalized low voltage QRS, QTC 481ms clinically euvolemic, NYHA functional class I-2. Recent Labs: 03/14/2017: TSH 1.89 03/18/2017: ALT 12 07/08/2017: Hemoglobin 13.6; Magnesium 2.1; Platelets 219 07/11/2017: BUN 28; Creatinine, Ser 1.64; Potassium 5.0; Sodium 142   Lipid Panel    Component Value Date/Time   CHOL 134 03/14/2017 1126   TRIG 158.0 (H)  03/14/2017 1126   HDL 31.60 (L) 03/14/2017 1126   CHOLHDL 4 03/14/2017 1126   VLDL 31.6 03/14/2017 1126   LDLCALC 71 03/14/2017 1126     ASSESSMENT:    1. Chronic combined systolic and diastolic heart failure ([place])   2. PAF (paroxysmal atrial fibrillation) ([place])   3. Nonrheumatic aortic valve insufficiency   4. OSA (obstructive sleep apnea)   5. Overweight   6. Essential hypertension   7. Long term (current) use of anticoagulants   8. Encounter for monitoring dofetilide therapy      PLAN:  In order of problems listed above:  1. CHF (predominantly diastolic): He has needs to progress with weight loss and will have to adjust his "dry weight".  As he loses additional weight he may not tolerate the same doses of beta-blockers and ACE inhibitors.  With EF now over 40% Entresto does not appear to be necessary or indicated.  Doing well on current dose of diuretics, but I suspect we will have to back off the future. 2. AFib:   Overall he has had a good record of success with decreasing, despite the recent breakthrough event.  Currently on combination of thickness and Ranexa.  QTc today is well within acceptable range.  Labs recently performed.  He is never aware of the arrhythmia itself and its important that we have some systemic monitoring for protracted tachycardia to avoid recurrent heart failure.  He has a Chiropodist. He was intolerant of amiodarone due to intractable nausea and vomiting. CHADSVAsc 3 (HTN, CHF, DM). Reminded him of the risk of  multiple drug interactions with dofetilide and with warfarin. 3. Moderate AI: Very dizzy patient is he has always appeared hemodynamically significant, but not enough to cause cardiomyopathy heart failure. 4. OSA: He reports 100% compliance with CPAP. He has difficulty falling asleep and staying asleep  and is taking benzodiazepines. 5. Obesity: Congratulated on weight loss.  He has an ambitious goal of 295 pounds by August. 6. HTN: With increase  in strength to a sodium restricted diet his blood pressure control has become dramatically easier.  I suspect we may have to reduce the dose of antihypertensive medicines in the future.  Need to keep him on a high dose of beta-blocker acute due to his problems with rapid ventricular rates. 7. Warfarin: INR slightly supratherapeutic today.  Warfarin dose adjusted 8. Tikosyn: QT interval in desirable range even after addition of  Ranexa.  Medication Adjustments/Labs and Tests Ordered: Current medicines are reviewed at length with the patient today.  Concerns regarding medicines are outlined above.  Medication changes, Labs and Tests ordered today are listed in the Patient Instructions below. Patient Instructions  Dr Sallyanne Kuster recommends that you schedule a follow-up appointment in 4 months.  If you need a refill on your cardiac medications before your next appointment, please call your pharmacy.    Signed, Sanda Klein, MD  09/23/2017 8:37 AM    [facility] [address], [place], [place]  [address] Phone: [phone]; Fax: [phone]

## 2017-09-23 NOTE — Patient Instructions (Signed)
Dr Croitoru recommends that you schedule a follow-up appointment in 4 months.  If you need a refill on your cardiac medications before your next appointment, please call your pharmacy. 

## 2017-09-26 NOTE — Telephone Encounter (Signed)
Requesting: REST Contract:12/16/16 UDS: 12/16/16 Low risk Last OV: 07/05/17 Next OV: 10/21/17 Last Refill: 01/16/17   Please advise

## 2017-09-26 NOTE — Telephone Encounter (Signed)
Approved on behalf of reg PCP.  

## 2017-09-30 ENCOUNTER — Other Ambulatory Visit: Payer: Self-pay

## 2017-09-30 MED ORDER — DOFETILIDE 500 MCG PO CAPS: 500.0000 ug | ORAL_CAPSULE | Freq: Two times a day (BID) | ORAL | 1 refills | Status: DC

## 2017-10-03 ENCOUNTER — Other Ambulatory Visit: Payer: Self-pay | Admitting: Cardiovascular Disease

## 2017-10-03 NOTE — Telephone Encounter (Signed)
REFILL 

## 2017-10-09 ENCOUNTER — Other Ambulatory Visit: Payer: Self-pay | Admitting: Family Medicine

## 2017-10-09 DIAGNOSIS — M25561 Pain in right knee: Secondary | ICD-10-CM

## 2017-10-09 DIAGNOSIS — E785 Hyperlipidemia, unspecified: Secondary | ICD-10-CM

## 2017-10-09 DIAGNOSIS — E875 Hyperkalemia: Secondary | ICD-10-CM

## 2017-10-09 DIAGNOSIS — Z7901 Long term (current) use of anticoagulants: Secondary | ICD-10-CM

## 2017-10-09 DIAGNOSIS — H409 Unspecified glaucoma: Secondary | ICD-10-CM

## 2017-10-09 DIAGNOSIS — E669 Obesity, unspecified: Secondary | ICD-10-CM

## 2017-10-09 DIAGNOSIS — D649 Anemia, unspecified: Secondary | ICD-10-CM

## 2017-10-09 DIAGNOSIS — G4700 Insomnia, unspecified: Secondary | ICD-10-CM

## 2017-10-09 DIAGNOSIS — I10 Essential (primary) hypertension: Secondary | ICD-10-CM

## 2017-10-09 DIAGNOSIS — M25562 Pain in left knee: Secondary | ICD-10-CM

## 2017-10-09 DIAGNOSIS — E1169 Type 2 diabetes mellitus with other specified complication: Secondary | ICD-10-CM

## 2017-10-09 DIAGNOSIS — F411 Generalized anxiety disorder: Secondary | ICD-10-CM

## 2017-10-10 ENCOUNTER — Other Ambulatory Visit: Payer: Self-pay | Admitting: *Deleted

## 2017-10-10 MED ORDER — DOFETILIDE 500 MCG PO CAPS: 500.0000 ug | ORAL_CAPSULE | Freq: Two times a day (BID) | ORAL | 1 refills | Status: DC

## 2017-10-11 ENCOUNTER — Other Ambulatory Visit: Payer: Self-pay | Admitting: Family Medicine

## 2017-10-19 ENCOUNTER — Ambulatory Visit (INDEPENDENT_AMBULATORY_CARE_PROVIDER_SITE_OTHER): Payer: Medicare Other | Admitting: Pharmacist Clinician (PhC)/ Clinical Pharmacy Specialist

## 2017-10-19 DIAGNOSIS — Z7901 Long term (current) use of anticoagulants: Secondary | ICD-10-CM | POA: Diagnosis not present

## 2017-10-19 DIAGNOSIS — I4891 Unspecified atrial fibrillation: Secondary | ICD-10-CM | POA: Diagnosis not present

## 2017-10-19 DIAGNOSIS — I480 Paroxysmal atrial fibrillation: Secondary | ICD-10-CM | POA: Diagnosis not present

## 2017-10-19 LAB — POCT INR: INR: 2.7

## 2017-10-21 ENCOUNTER — Encounter: Payer: Self-pay | Admitting: Family Medicine

## 2017-10-21 ENCOUNTER — Ambulatory Visit (INDEPENDENT_AMBULATORY_CARE_PROVIDER_SITE_OTHER): Payer: Medicare Other | Admitting: Family Medicine

## 2017-10-21 VITALS — BP 98/58 | HR 64 | Temp 97.8°F | Resp 18 | Wt 213.8 lb

## 2017-10-21 DIAGNOSIS — M25561 Pain in right knee: Secondary | ICD-10-CM

## 2017-10-21 DIAGNOSIS — E1169 Type 2 diabetes mellitus with other specified complication: Secondary | ICD-10-CM | POA: Diagnosis not present

## 2017-10-21 DIAGNOSIS — E782 Mixed hyperlipidemia: Secondary | ICD-10-CM

## 2017-10-21 DIAGNOSIS — M1A9XX0 Chronic gout, unspecified, without tophus (tophi): Secondary | ICD-10-CM | POA: Diagnosis not present

## 2017-10-21 DIAGNOSIS — M25562 Pain in left knee: Secondary | ICD-10-CM

## 2017-10-21 DIAGNOSIS — Z79899 Other long term (current) drug therapy: Secondary | ICD-10-CM

## 2017-10-21 DIAGNOSIS — E669 Obesity, unspecified: Secondary | ICD-10-CM | POA: Diagnosis not present

## 2017-10-21 DIAGNOSIS — I10 Essential (primary) hypertension: Secondary | ICD-10-CM

## 2017-10-21 LAB — COMPREHENSIVE METABOLIC PANEL
ALK PHOS: 38 U/L — AB (ref 39–117)
ALT: 8 U/L (ref 0–53)
AST: 12 U/L (ref 0–37)
Albumin: 4.2 g/dL (ref 3.5–5.2)
BILIRUBIN TOTAL: 0.5 mg/dL (ref 0.2–1.2)
BUN: 57 mg/dL — ABNORMAL HIGH (ref 6–23)
CALCIUM: 9.8 mg/dL (ref 8.4–10.5)
CO2: 27 mEq/L (ref 19–32)
Chloride: 103 mEq/L (ref 96–112)
Creatinine, Ser: 2.37 mg/dL — ABNORMAL HIGH (ref 0.40–1.50)
GFR: 35.65 mL/min — AB (ref >60.00)
GLUCOSE: 76 mg/dL (ref 70–99)
POTASSIUM: 4.4 meq/L (ref 3.5–5.1)
Sodium: 139 mEq/L (ref 135–145)
Total Protein: 7.4 g/dL (ref 6.0–8.3)

## 2017-10-21 LAB — CBC
HEMATOCRIT: 43.6 % (ref 39.0–52.0)
HEMOGLOBIN: 14 g/dL (ref 13.0–17.0)
MCHC: 32.2 g/dL (ref 30.0–36.0)
MCV: 83.8 fl (ref 78.0–100.0)
PLATELETS: 195 10*3/uL (ref 150.0–400.0)
RBC: 5.2 Mil/uL (ref 4.22–5.81)
RDW: 19.3 % — AB (ref 11.5–15.5)
WBC: 4.7 10*3/uL (ref 4.0–10.5)

## 2017-10-21 LAB — TSH: TSH: 1.53 u[IU]/mL (ref 0.35–4.50)

## 2017-10-21 LAB — LIPID PANEL
Cholesterol: 145 mg/dL (ref 0–200)
HDL: 30.5 mg/dL — AB (ref >39.00)
LDL Cholesterol: 90 mg/dL (ref 0–99)
NONHDL: 114.58
TRIGLYCERIDES: 124 mg/dL (ref 0.0–149.0)
Total CHOL/HDL Ratio: 5
VLDL: 24.8 mg/dL (ref 0.0–40.0)

## 2017-10-21 LAB — HEMOGLOBIN A1C: Hgb A1c MFr Bld: 6.2 % (ref 4.6–6.5)

## 2017-10-21 MED ORDER — HYDROCODONE-ACETAMINOPHEN 10-325 MG PO TABS: 1.0000 | ORAL_TABLET | Freq: Three times a day (TID) | ORAL | 0 refills | Status: DC | PRN

## 2017-10-21 MED ORDER — CLOTRIMAZOLE-BETAMETHASONE 1-0.05 % EX CREA: 1.0000 "application " | TOPICAL_CREAM | Freq: Two times a day (BID) | CUTANEOUS | 1 refills | Status: DC

## 2017-10-21 NOTE — Assessment & Plan Note (Signed)
hgba1c acceptable, minimize simple carbs. Increase exercise as tolerated. Continue current meds 

## 2017-10-21 NOTE — Assessment & Plan Note (Signed)
No recent flare.  

## 2017-10-21 NOTE — Patient Instructions (Addendum)
Cleanse the affected area with Neutrogena T gel or [place] Blue twice a week  Zyrtec/Cetirizine 10 mg tabs at [facility]  Clotrimazole is the antifungal over the counter twice daily and then can Hydrocortisone cream twice daily   Hypertension Hypertension is another name for high blood pressure. High blood pressure forces your heart to work harder to pump blood. This can cause problems over time. There are two numbers in a blood pressure reading. There is a top number (systolic) over a bottom number (diastolic). It is best to have a blood pressure below 120/80. Healthy choices can help lower your blood pressure. You may need medicine to help lower your blood pressure if:  Your blood pressure cannot be lowered with healthy choices.  Your blood pressure is higher than 130/80.  Follow these instructions at home: Eating and drinking  If directed, follow the DASH eating plan. This diet includes: ? Filling half of your plate at each meal with fruits and vegetables. ? Filling one quarter of your plate at each meal with whole grains. Whole grains include whole wheat pasta, brown rice, and whole grain bread. ? Eating or drinking low-fat dairy products, such as skim milk or low-fat yogurt. ? Filling one quarter of your plate at each meal with low-fat (lean) proteins. Low-fat proteins include fish, skinless chicken, eggs, beans, and tofu. ? Avoiding fatty meat, cured and processed meat, or chicken with skin. ? Avoiding premade or processed food.  Eat less than 1,500 mg of salt (sodium) a day.  Limit alcohol use to no more than 1 drink a day for nonpregnant women and 2 drinks a day for men. One drink equals 12 oz of beer, 5 oz of wine, or 1 oz of hard liquor. Lifestyle  Work with your doctor to stay at a healthy weight or to lose weight. Ask your doctor what the best weight is for you.  Get at least 30 minutes of exercise that causes your heart to beat faster (aerobic exercise) most days of the week.  This may include walking, swimming, or biking.  Get at least 30 minutes of exercise that strengthens your muscles (resistance exercise) at least 3 days a week. This may include lifting weights or pilates.  Do not use any products that contain nicotine or tobacco. This includes cigarettes and e-cigarettes. If you need help quitting, ask your doctor.  Check your blood pressure at home as told by your doctor.  Keep all follow-up visits as told by your doctor. This is important. Medicines  Take over-the-counter and prescription medicines only as told by your doctor. Follow directions carefully.  Do not skip doses of blood pressure medicine. The medicine does not work as well if you skip doses. Skipping doses also puts you at risk for problems.  Ask your doctor about side effects or reactions to medicines that you should watch for. Contact a doctor if:  You think you are having a reaction to the medicine you are taking.  You have headaches that keep coming back (recurring).  You feel dizzy.  You have swelling in your ankles.  You have trouble with your vision. Get help right away if:  You get a very bad headache.  You start to feel confused.  You feel weak or numb.  You feel faint.  You get very bad pain in your: ? Chest. ? Belly (abdomen).  You throw up (vomit) more than once.  You have trouble breathing. Summary  Hypertension is another name for high blood pressure.  Making healthy choices can help lower blood pressure. If your blood pressure cannot be controlled with healthy choices, you may need to take medicine. This information is not intended to replace advice given to you by your health care provider. Make sure you discuss any questions you have with your health care provider. Document Released: 11/10/2007 Document Revised: 04/21/2016 Document Reviewed: 04/21/2016 Elsevier Interactive Patient Education  [employer].

## 2017-10-21 NOTE — Assessment & Plan Note (Signed)
Encouraged heart healthy diet, increase exercise, avoid trans fats, consider a krill oil cap daily 

## 2017-10-21 NOTE — Progress Notes (Signed)
Subjective:  I acted as a Education administrator for Dr. Charlett Blake. [name], [name]  Patient ID: [name], male    DOB: 1951/03/22, 67 y.o.   MRN: [id number]  No chief complaint on file.   HPI  Patient is in today for 3 month follow up. He is following up on his Atrial fibrillation with RVR. And various medical concerns. He feels well today now that his Afib is rate controlled again. No recent febrile illness or acute concerns. No polyuria or polydipsia. Denies CP/palp/SOB/HA/congestion/fevers/GI or GU c/o. Taking meds as prescribed  Patient Care Team: [name], MD as PCP - General Croitoru, Dani Gobble, MD as PCP - Cardiology (Cardiology) Croitoru, Dani Gobble, MD (Cardiology) Haverstock, Jennefer Bravo, MD as Consulting Physician (Dermatology) Rigoberto Noel, MD as Consulting Physician (Pulmonary Disease) Irine Seal, MD as Consulting Physician (Urology)   Past Medical History:  Diagnosis Date  . AORTIC STENOSIS, MODERATE 12/05/2009  . Atrial fibrillation ([place])   . Atrial fibrillation with RVR ([place]) 07/05/2017  . Atrial flutter ([place]) 12/05/2009  . Atypical chest pain 11/08/2011  . BENIGN PROSTATIC HYPERTROPHY, HX OF 12/05/2009  . CHEST PAIN-UNSPECIFIED 11/11/2009  . CHF 12/05/2009  . Chronic renal insufficiency 12/16/2016  . Debility 04/18/2012  . Diarrhea 12/16/2016  . DYSPNEA 12/19/2009  . Edema 04/18/2012  . ERECTILE DYSFUNCTION 01/25/2007  . FATIGUE, CHRONIC 11/11/2009  . Gout 07/10/2012  . Heart murmur   . History of kidney stones    "they passed" (07/05/2017)  . Hyperkalemia 04/18/2012  . Hyperlipidemia   . Hypertension   . HYPERTENSION 01/25/2007  . Hypoxia 05/05/2011  . Insomnia 05/13/2016  . INSOMNIA, HX OF 01/25/2007  . Knee pain, bilateral 12/28/2010  . Knee pain, right 12/28/2010  . NEUROMA 01/05/2010  . OBESITY NOS 01/25/2007  . OSA on CPAP 04/06/2010  . PERIPHERAL NEUROPATHY 01/05/2010  . PULMONARY FUNCTION TESTS, ABNORMAL 02/02/2010  . Superficial thrombophlebitis of left leg 09/07/2011  .  TESTICULAR HYPOFUNCTION 01/05/2010  . 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 Toe pain, right 07/10/2012  . Type II diabetes mellitus ([place]) 12/05/2009  . UNSPECIFIED ANEMIA 01/05/2010    Past Surgical History:  Procedure Laterality Date  . CARDIAC CATHETERIZATION  10/16/2009   nonischemic cardiomyopathy  . CARDIOVERSION N/A 03/25/2015   Procedure: CARDIOVERSION;  Surgeon: Pixie Casino, MD;  Location[facility] ENDOSCOPY;  Service: Cardiovascular;  Laterality: N/A;  . CARDIOVERSION N/A 06/17/2017   Procedure: CARDIOVERSION;  Surgeon: Sanda Klein, MD;  Location: [place] ENDOSCOPY;  Service: Cardiovascular;  Laterality: N/A;  . CARDIOVERSION N/A 07/08/2017   Procedure: CARDIOVERSION;  Surgeon: Fay Records, MD;  Location: [facility] ENDOSCOPY;  Service: Cardiovascular;  Laterality: N/A;  . METATARSAL OSTEOTOMY Bilateral ~ 1980   removed part of 5th metatarsal to corect curvature of toes     Family History  Problem Relation Age of Onset  . Clotting disorder Mother   . Heart disease Mother        s/p MI  . Heart attack Mother   . Hypertension Mother   . Diabetes Mother   . Hyperlipidemia Mother   . Stroke Mother   . Cancer Father        ? lung  . Lung disease Father        smoker  . Diabetes Sister   . Hypertension Sister        smoker  . Leukemia Maternal Grandmother        ?  [name] Aneurysm Sister        brain  .  Other Sister        clipped  . Seizures Sister        d/o w/aneurysm/ smoker    Social History   Socioeconomic History  . Marital status: Married    Spouse name: Not on file  . Number of children: Not on file  . Years of education: Not on file  . Highest education level: Not on file  Occupational History  . Not on file  Social Needs  . Financial resource strain: Not on file  . Food insecurity:    Worry: Not on file    Inability: Not on file  . Transportation needs:    Medical: Not on file    Non-medical: Not on file  Tobacco Use  . Smoking status: Former Smoker    Packs/day: 1.00     Years: 10.00    Pack years: 10.00    Last attempt to quit: 06/07/1980    Years since quitting: 37.4  . Smokeless tobacco: Never Used  Substance and Sexual Activity  . Alcohol use: No  . Drug use: No  . Sexual activity: Not Currently  Lifestyle  . Physical activity:    Days per week: Not on file    Minutes per session: Not on file  . Stress: Not on file  Relationships  . Social connections:    Talks on phone: Not on file    Gets together: Not on file    Attends religious service: Not on file    Active member of club or organization: Not on file    Attends meetings of clubs or organizations: Not on file    Relationship status: Not on file  . Intimate partner violence:    Fear of current or ex partner: Not on file    Emotionally abused: Not on file    Physically abused: Not on file    Forced sexual activity: Not on file  Other Topics Concern  . Not on file  Social History Narrative  . Not on file    Outpatient Medications Prior to Visit  Medication Sig Dispense Refill  . albuterol (VENTOLIN HFA) 108 (90 Base) MCG/ACT inhaler Inhale 2 puffs into the lungs every 6 (six) hours as needed for wheezing or shortness of breath. 1 Inhaler 6  . allopurinol (ZYLOPRIM) 300 MG tablet TAKE 1 TABLET BY MOUTH ONCE DAILY 90 tablet 2  . benazepril (LOTENSIN) 10 MG tablet Take 1 tablet (10 mg total) by mouth daily. 90 tablet 3  . calcium carbonate (TUMS - DOSED IN MG ELEMENTAL CALCIUM) 500 MG chewable tablet Chew 1 tablet by mouth daily as needed. For heartburn    . cetirizine (ZYRTEC) 10 MG tablet Take 1 tablet (10 mg total) by mouth daily. 30 tablet 11  . Clobetasol Prop Emollient Base (CLOBETASOL PROPIONATE E) 0.05 % emollient cream Apply 1 application topically 2 (two) times daily as needed (insect bite). 60 g 1  . colchicine (COLCRYS) 0.6 MG tablet TAKE ONE TABLET BY MOUTH DAILY AS NEEDED FOR GOUT FLARE UPS. 30 tablet 0  . dofetilide (TIKOSYN) 500 MCG capsule Take 1 capsule (500 mcg total) by  mouth 2 (two) times daily. 180 capsule 1  . fish oil-omega-3 fatty acids 1000 MG capsule Take 1 g by mouth daily.     . furosemide (LASIX) 40 MG tablet TAKE TWO TABLETS BY MOUTH EVERY MORNING AND TAKE ONE TABLET BY MOUTH EVERY AFTERNOON 270 tablet 3  . glyBURIDE (DIABETA) 5 MG tablet TAKE 1 TABLET BY  MOUTH ONCE DAILY WITH BREAKFAST 90 tablet 0  . KLOR-CON M20 20 MEQ tablet TAKE 1 TABLET BY MOUTH ONCE DAILY AND  EXTRA  TABLET  IF  WEIGHT  IS  ABOVE  210  LBS 180 tablet 2  . LORazepam (ATIVAN) 1 MG tablet Take 1 tablet (1 mg total) by mouth every 8 (eight) hours as needed for anxiety. (Patient taking differently: Take 0.5 mg by mouth every 8 (eight) hours as needed for anxiety. ) 30 tablet 3  . magnesium oxide (MAG-OX) 400 MG tablet Take 1 tablet (400 mg total) by mouth daily. 30 tablet 11  . metFORMIN (GLUCOPHAGE) 500 MG tablet TAKE 1 TABLET BY MOUTH THREE TIMES DAILY 270 tablet 0  . metoprolol succinate (TOPROL-XL) 100 MG 24 hr tablet Take 1 tablet (100 mg total) by mouth 2 (two) times daily. Take with or immediately following a meal. 60 tablet 5  . Multiple Vitamins-Minerals (MULTIVITAMIN PO) Take 1 tablet by mouth daily.    . naproxen (NAPROSYN) 375 MG tablet Take 1 tablet (375 mg total) by mouth daily. (Patient taking differently: Take 375 mg by mouth daily as needed for mild pain. ) 30 tablet 4  . pravastatin (PRAVACHOL) 40 MG tablet TAKE 1 TABLET BY MOUTH ONCE DAILY 90 tablet 3  . ranolazine (RANEXA) 500 MG 12 hr tablet Take 1 tablet (500 mg total) by mouth 2 (two) times daily. 60 tablet 5  . temazepam (RESTORIL) 30 MG capsule TAKE 1 CAPSULE BY MOUTH AT BEDTIME AS NEEDED FOR SLEEP 30 capsule 3  . Testosterone 10 MG/ACT (2%) GEL Apply 1 application topically daily. Use as directed    . warfarin (COUMADIN) 4 MG tablet Take 2-4 mg by mouth See admin instructions. 4mg  on Mon/Wed/Fri 2mg  on Sun/Tues/Thurs/Sat    . warfarin (COUMADIN) 4 MG tablet TAKE 1/2 TO 1 (ONE-HALF TO ONE) TABLET BY MOUTH DAILY  OR AS DIRECTED BY COUMADIN CLINIC 90 tablet 1   No facility-administered medications prior to visit.     Allergies  Allergen Reactions  . Sulfonamide Derivatives Other (See Comments)    unknown reaction.  Patient states had to go to the ER.   [name] Zolpidem Other (See Comments)    Excessive, prolonged sedation    Review of Systems  Constitutional: Negative for fever and malaise/fatigue.  HENT: Negative for congestion.   Eyes: Negative for blurred vision.  Respiratory: Negative for shortness of breath.   Cardiovascular: Negative for chest pain, palpitations and leg swelling.  Gastrointestinal: Negative for abdominal pain, blood in stool and nausea.  Genitourinary: Negative for dysuria and frequency.  Musculoskeletal: Negative for falls.  Skin: Negative for rash.  Neurological: Negative for dizziness, loss of consciousness and headaches.  Endo/Heme/Allergies: Negative for environmental allergies.  Psychiatric/Behavioral: Negative for depression. The patient is not nervous/anxious.        Objective:    Physical Exam  Constitutional: He is oriented to person, place, and time. No distress.  HENT:  Head: Normocephalic and atraumatic.  Eyes: Conjunctivae are normal.  Neck: Neck supple. No thyromegaly present.  Cardiovascular: Normal rate, regular rhythm and normal heart sounds.  No murmur heard. Pulmonary/Chest: Effort normal and breath sounds normal. No respiratory distress.  Abdominal: He exhibits no distension and no mass. There is no tenderness.  Musculoskeletal: He exhibits no edema.  Neurological: He is alert and oriented to person, place, and time.  Skin: Skin is warm.  Psychiatric: Judgment normal.    BP (!) 98/58 (BP Location: Left Arm, Patient Position:  Sitting, Cuff Size: Normal)   Pulse 64   Temp 97.8 F (36.6 C) (Oral)   Resp 18   Wt 213 lb 12.8 oz (97 kg)   SpO2 95%   BMI 29.82 kg/m  Wt Readings from Last 3 Encounters:  10/21/17 213 lb 12.8 oz (97 kg)    09/23/17 212 lb 6.4 oz (96.3 kg)  08/10/17 211 lb (95.7 kg)   BP Readings from Last 3 Encounters:  10/21/17 (!) 98/58  09/23/17 111/66  08/10/17 102/66     Immunization History  Administered Date(s) Administered  . Influenza Split 03/03/2011, 03/10/2012  . Influenza Whole 03/28/2008, 03/30/2010  . Influenza,inj,Quad PF,6+ Mos 01/31/2013, 03/07/2014, 04/14/2015, 02/19/2016, 03/18/2017  . Pneumococcal Polysaccharide-23 10/07/2009  . Td 09/05/2009    Health Maintenance  Topic Date Due  . OPHTHALMOLOGY EXAM  01/07/1963  . COLONOSCOPY  01/07/2003  . FOOT EXAM  08/12/2017  . INFLUENZA VACCINE  01/05/2018  . HEMOGLOBIN A1C  04/23/2018  . TETANUS/TDAP  09/06/2019  . Hepatitis C Screening  Addressed  . HIV Screening  Addressed    Lab Results  Component Value Date   WBC 4.7 10/21/2017   HGB 14.0 10/21/2017   HCT 43.6 10/21/2017   PLT 195.0 10/21/2017   GLUCOSE 76 10/21/2017   CHOL 145 10/21/2017   TRIG 124.0 10/21/2017   HDL 30.50 (L) 10/21/2017   LDLCALC 90 10/21/2017   ALT 8 10/21/2017   AST 12 10/21/2017   NA 139 10/21/2017   K 4.4 10/21/2017   CL 103 10/21/2017   CREATININE 2.37 (H) 10/21/2017   BUN 57 (H) 10/21/2017   CO2 27 10/21/2017   TSH 1.53 10/21/2017   PSA 4.26 (H) 12/29/2009   INR 2.7 10/19/2017   HGBA1C 6.2 10/21/2017   MICROALBUR 1.1 04/14/2015    Lab Results  Component Value Date   TSH 1.53 10/21/2017   Lab Results  Component Value Date   WBC 4.7 10/21/2017   HGB 14.0 10/21/2017   HCT 43.6 10/21/2017   MCV 83.8 10/21/2017   PLT 195.0 10/21/2017   Lab Results  Component Value Date   NA 139 10/21/2017   K 4.4 10/21/2017   CO2 27 10/21/2017   GLUCOSE 76 10/21/2017   BUN 57 (H) 10/21/2017   CREATININE 2.37 (H) 10/21/2017   BILITOT 0.5 10/21/2017   ALKPHOS 38 (L) 10/21/2017   AST 12 10/21/2017   ALT 8 10/21/2017   PROT 7.4 10/21/2017   ALBUMIN 4.2 10/21/2017   CALCIUM 9.8 10/21/2017   ANIONGAP 11 07/08/2017   GFR 35.65 (L)  10/21/2017   Lab Results  Component Value Date   CHOL 145 10/21/2017   Lab Results  Component Value Date   HDL 30.50 (L) 10/21/2017   Lab Results  Component Value Date   LDLCALC 90 10/21/2017   Lab Results  Component Value Date   TRIG 124.0 10/21/2017   Lab Results  Component Value Date   CHOLHDL 5 10/21/2017   Lab Results  Component Value Date   HGBA1C 6.2 10/21/2017         Assessment & Plan:   Problem List Items Addressed This Visit    Diabetes mellitus type 2 in obese ([place])    hgba1c acceptable, minimize simple carbs. Increase exercise as tolerated. Continue current meds      Relevant Orders   Hemoglobin A1c (Completed)   Hyperlipidemia    Encouraged heart healthy diet, increase exercise, avoid trans fats, consider a krill oil cap daily.  Relevant Orders   Lipid panel (Completed)   Knee pain, bilateral   Relevant Medications   HYDROcodone-acetaminophen (NORCO) 10-325 MG tablet   Gout    No recent flare.       Relevant Orders   Lipid panel (Completed)   Essential hypertension, malignant    Well controlled, no changes to meds. Encouraged heart healthy diet such as the DASH diet and exercise as tolerated.       Relevant Orders   CBC (Completed)   Comprehensive metabolic panel (Completed)   TSH (Completed)    Other Visit Diagnoses    High risk medication use    -  Primary   Relevant Orders   Pain Mgmt, Profile 8 w/Conf, U      I am having [name] start on clotrimazole-betamethasone. I am also having him maintain his fish oil-omega-3 fatty acids, calcium carbonate, Multiple Vitamins-Minerals (MULTIVITAMIN PO), Testosterone, cetirizine, magnesium oxide, colchicine, naproxen, LORazepam, glyBURIDE, albuterol, Clobetasol Prop Emollient Base, warfarin, furosemide, metoprolol succinate, ranolazine, pravastatin, warfarin, metFORMIN, benazepril, temazepam, KLOR-CON M20, allopurinol, dofetilide, and HYDROcodone-acetaminophen.  Meds ordered  this encounter  Medications  . HYDROcodone-acetaminophen (NORCO) 10-325 MG tablet    Sig: Take 1 tablet by mouth every 8 (eight) hours as needed for moderate pain or severe pain.    Dispense:  90 tablet    Refill:  0  . clotrimazole-betamethasone (LOTRISONE) cream    Sig: Apply 1 application topically 2 (two) times daily.    Dispense:  30 g    Refill:  1    CMA served as scribe during this visit. History, Physical and Plan performed by medical provider. Documentation and orders reviewed and attested to.  Penni Homans, MD

## 2017-10-21 NOTE — Assessment & Plan Note (Signed)
Well controlled, no changes to meds. Encouraged heart healthy diet such as the DASH diet and exercise as tolerated.  °

## 2017-10-24 ENCOUNTER — Other Ambulatory Visit: Payer: Self-pay | Admitting: Family Medicine

## 2017-10-24 DIAGNOSIS — M109 Gout, unspecified: Secondary | ICD-10-CM

## 2017-10-24 LAB — PAIN MGMT, PROFILE 8 W/CONF, U
6 Acetylmorphine: NEGATIVE ng/mL (ref <10)
ALCOHOL METABOLITES: NEGATIVE ng/mL (ref <500)
ALPHAHYDROXYALPRAZOLAM: NEGATIVE ng/mL (ref <25)
AMINOCLONAZEPAM: NEGATIVE ng/mL (ref <25)
Alphahydroxymidazolam: NEGATIVE ng/mL (ref <50)
Alphahydroxytriazolam: NEGATIVE ng/mL (ref <50)
Amphetamines: NEGATIVE ng/mL (ref <500)
BENZODIAZEPINES: POSITIVE ng/mL — AB (ref <100)
BUPRENORPHINE, URINE: NEGATIVE ng/mL (ref <5)
COCAINE METABOLITE: NEGATIVE ng/mL (ref <150)
Creatinine: 94.1 mg/dL
Hydroxyethylflurazepam: NEGATIVE ng/mL (ref <50)
LORAZEPAM: NEGATIVE ng/mL (ref <50)
MDMA: NEGATIVE ng/mL (ref <500)
Marijuana Metabolite: NEGATIVE ng/mL (ref <20)
Nordiazepam: NEGATIVE ng/mL (ref <50)
Opiates: NEGATIVE ng/mL (ref <100)
Oxazepam: 505 ng/mL — ABNORMAL HIGH (ref <50)
Oxidant: NEGATIVE ug/mL (ref <200)
Oxycodone: NEGATIVE ng/mL (ref <100)
pH: 5.16 (ref 4.5–9.0)

## 2017-10-27 ENCOUNTER — Telehealth: Payer: Self-pay

## 2017-10-27 NOTE — Telephone Encounter (Signed)
PA initiated via Covermymeds; KEY: [id number]. Awaiting determination.

## 2017-10-27 NOTE — Telephone Encounter (Signed)
PA approved. Effective 06/05/2017 to 06/06/2018.  

## 2017-11-14 ENCOUNTER — Ambulatory Visit (INDEPENDENT_AMBULATORY_CARE_PROVIDER_SITE_OTHER): Payer: Medicare Other | Admitting: Cardiology

## 2017-11-14 ENCOUNTER — Encounter: Payer: Self-pay | Admitting: Cardiology

## 2017-11-14 VITALS — BP 114/70 | HR 69 | Ht 71.0 in | Wt 210.2 lb

## 2017-11-14 DIAGNOSIS — I5022 Chronic systolic (congestive) heart failure: Secondary | ICD-10-CM | POA: Diagnosis not present

## 2017-11-14 DIAGNOSIS — I10 Essential (primary) hypertension: Secondary | ICD-10-CM

## 2017-11-14 DIAGNOSIS — I481 Persistent atrial fibrillation: Secondary | ICD-10-CM | POA: Diagnosis not present

## 2017-11-14 DIAGNOSIS — I4819 Other persistent atrial fibrillation: Secondary | ICD-10-CM

## 2017-11-14 NOTE — Patient Instructions (Signed)
Medication Instructions:  Your physician recommends that you continue on your current medications as directed. Please refer to the Current Medication list given to you today.  Labwork: None ordered  Testing/Procedures: None ordered  Follow-Up: Your physician wants you to follow-up in: 6 months with Dr. Camnitz.  You will receive a reminder letter in the mail two months in advance. If you don't receive a letter, please call our office to schedule the follow-up appointment.  * If you need a refill on your cardiac medications before your next appointment, please call your pharmacy.   *Please note that any paperwork needing to be filled out by the provider will need to be addressed at the front desk prior to seeing the provider. Please note that any FMLA, disability or other documents regarding health condition is subject to a $25.00 charge that must be received prior to completion of paperwork in the form of a money order or check.  Thank you for choosing [facility]!!   Dawid Dupriest, RN [phone]        

## 2017-11-14 NOTE — Progress Notes (Signed)
Electrophysiology Office Note   Date:  11/14/2017   ID:  [name], [name] 08/17/1950, MRN [id number]  PCP:  [name], MD  Cardiologist:  [facility] Primary Electrophysiologist:  Reeve Turnley Meredith [name], MD    No chief complaint on file.    History of Present Illness: [name] is a 67 y.o. male who is being seen today for the evaluation of atrial fibrillation at the request of Mihai Croitrou. Presenting today for electrophysiology evaluation.  He has a history of atrial fibrillation, atrial flutter, aortic insufficiency, hypertension, OSA, hyperlipidemia, type 2 diabetes, and diastolic heart failure.  He presented to the hospital in February 2019 with rapid heart rates.  He had been previously on dofetilide, with Ranexa added that admission.  He required cardioversion.   Today, denies symptoms of palpitations, chest pain, shortness of breath, orthopnea, PND, lower extremity edema, claudication, dizziness, presyncope, syncope, bleeding, or neurologic sequela. The patient is tolerating medications without difficulties.  Overall he is doing well.  He has had a few episodes of palpitations that are very short-lived.  He has changed his diet and is eating much healthier.  He is continuing to lose weight.  He is overall pleased with his atrial fibrillation burden.   Past Medical History:  Diagnosis Date  . AORTIC STENOSIS, MODERATE 12/05/2009  . Atrial fibrillation ([name])   . Atrial fibrillation with RVR ([name]) 07/05/2017  . Atrial flutter ([name]) 12/05/2009  . Atypical chest pain 11/08/2011  . BENIGN PROSTATIC HYPERTROPHY, HX OF 12/05/2009  . CHEST PAIN-UNSPECIFIED 11/11/2009  . CHF 12/05/2009  . Chronic renal insufficiency 12/16/2016  . Debility 04/18/2012  . Diarrhea 12/16/2016  . DYSPNEA 12/19/2009  . Edema 04/18/2012  . ERECTILE DYSFUNCTION 01/25/2007  . FATIGUE, CHRONIC 11/11/2009  . Gout 07/10/2012  . Heart murmur   . History of kidney stones    "they passed" (07/05/2017)  .  Hyperkalemia 04/18/2012  . Hyperlipidemia   . Hypertension   . HYPERTENSION 01/25/2007  . Hypoxia 05/05/2011  . Insomnia 05/13/2016  . INSOMNIA, HX OF 01/25/2007  . Knee pain, bilateral 12/28/2010  . Knee pain, right 12/28/2010  . NEUROMA 01/05/2010  . OBESITY NOS 01/25/2007  . OSA on CPAP 04/06/2010  . PERIPHERAL NEUROPATHY 01/05/2010  . PULMONARY FUNCTION TESTS, ABNORMAL 02/02/2010  . Superficial thrombophlebitis of left leg 09/07/2011  . TESTICULAR HYPOFUNCTION 01/05/2010  . 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 Toe pain, right 07/10/2012  . Type II diabetes mellitus ([name]) 12/05/2009  . UNSPECIFIED ANEMIA 01/05/2010   Past Surgical History:  Procedure Laterality Date  . CARDIAC CATHETERIZATION  10/16/2009   nonischemic cardiomyopathy  . CARDIOVERSION N/A 03/25/2015   Procedure: CARDIOVERSION;  Surgeon: Pixie Casino, MD;  Location: [facility] ENDOSCOPY;  Service: Cardiovascular;  Laterality: N/A;  . CARDIOVERSION N/A 06/17/2017   Procedure: CARDIOVERSION;  Surgeon: Sanda Klein, MD;  Location: [facility] ENDOSCOPY;  Service: Cardiovascular;  Laterality: N/A;  . CARDIOVERSION N/A 07/08/2017   Procedure: CARDIOVERSION;  Surgeon: Fay Records, MD;  Location: [place];  Service: Cardiovascular;  Laterality: N/A;  . METATARSAL OSTEOTOMY Bilateral ~ 1980   removed part of 5th metatarsal to corect curvature of toes      Current Outpatient Medications  Medication Sig Dispense Refill  . albuterol (VENTOLIN HFA) 108 (90 Base) MCG/ACT inhaler Inhale 2 puffs into the lungs every 6 (six) hours as needed for wheezing or shortness of breath. 1 Inhaler 6  . allopurinol (ZYLOPRIM) 300 MG tablet TAKE 1 TABLET BY MOUTH ONCE DAILY  90 tablet 2  . benazepril (LOTENSIN) 40 MG tablet Take 40 mg by mouth daily.  3  . calcium carbonate (TUMS - DOSED IN MG ELEMENTAL CALCIUM) 500 MG chewable tablet Chew 1 tablet by mouth daily as needed. For heartburn    . cetirizine (ZYRTEC) 10 MG tablet Take 1 tablet (10 mg total) by mouth daily. 30  tablet 11  . Clobetasol Prop Emollient Base (CLOBETASOL PROPIONATE E) 0.05 % emollient cream Apply 1 application topically 2 (two) times daily as needed (insect bite). 60 g 1  . clotrimazole-betamethasone (LOTRISONE) cream Apply 1 application topically 2 (two) times daily. 30 g 1  . colchicine (COLCRYS) 0.6 MG tablet TAKE ONE TABLET BY MOUTH DAILY AS NEEDED FOR GOUT FLARE UPS. 30 tablet 0  . dofetilide (TIKOSYN) 500 MCG capsule Take 1 capsule (500 mcg total) by mouth 2 (two) times daily. 180 capsule 1  . fish oil-omega-3 fatty acids 1000 MG capsule Take 1 g by mouth daily.     . furosemide (LASIX) 40 MG tablet TAKE TWO TABLETS BY MOUTH EVERY MORNING AND TAKE ONE TABLET BY MOUTH EVERY AFTERNOON 270 tablet 3  . glyBURIDE (DIABETA) 5 MG tablet TAKE 1 TABLET BY MOUTH ONCE DAILY WITH BREAKFAST 90 tablet 0  . HYDROcodone-acetaminophen (NORCO) 10-325 MG tablet Take 1 tablet by mouth every 8 (eight) hours as needed for moderate pain or severe pain. 90 tablet 0  . KLOR-CON M20 20 MEQ tablet TAKE 1 TABLET BY MOUTH ONCE DAILY AND  EXTRA  TABLET  IF  WEIGHT  IS  ABOVE  210  LBS 180 tablet 2  . LORazepam (ATIVAN) 1 MG tablet Take 1 tablet (1 mg total) by mouth every 8 (eight) hours as needed for anxiety. (Patient taking differently: Take 0.5 mg by mouth every 8 (eight) hours as needed for anxiety. ) 30 tablet 3  . magnesium oxide (MAG-OX) 400 MG tablet Take 1 tablet (400 mg total) by mouth daily. 30 tablet 11  . metFORMIN (GLUCOPHAGE) 500 MG tablet TAKE 1 TABLET BY MOUTH THREE TIMES DAILY 270 tablet 0  . metoprolol succinate (TOPROL-XL) 100 MG 24 hr tablet Take 1 tablet (100 mg total) by mouth 2 (two) times daily. Take with or immediately following a meal. 60 tablet 5  . Multiple Vitamins-Minerals (MULTIVITAMIN PO) Take 1 tablet by mouth daily.    . naproxen (NAPROSYN) 375 MG tablet Take 375 mg by mouth daily as needed for mild pain.    . pravastatin (PRAVACHOL) 40 MG tablet TAKE 1 TABLET BY MOUTH ONCE DAILY 90  tablet 3  . ranolazine (RANEXA) 500 MG 12 hr tablet Take 1 tablet (500 mg total) by mouth 2 (two) times daily. 60 tablet 5  . temazepam (RESTORIL) 30 MG capsule TAKE 1 CAPSULE BY MOUTH AT BEDTIME AS NEEDED FOR SLEEP 30 capsule 3  . Testosterone 10 MG/ACT (2%) GEL Apply 1 application topically daily. Use as directed    . warfarin (COUMADIN) 4 MG tablet TAKE 1/2 TO 1 (ONE-HALF TO ONE) TABLET BY MOUTH DAILY OR AS DIRECTED BY COUMADIN CLINIC 90 tablet 1   No current facility-administered medications for this visit.     Allergies:   Sulfonamide derivatives and Zolpidem   Social History:  The patient  reports that he quit smoking about 37 years ago. He has a 10.00 pack-year smoking history. He has never used smokeless tobacco. He reports that he does not drink alcohol or use drugs.   Family History:  The patient's family history  includes Aneurysm in his sister; Cancer in his father; Clotting disorder in his mother; Diabetes in his mother and sister; Heart attack in his mother; Heart disease in his mother; Hyperlipidemia in his mother; Hypertension in his mother and sister; Leukemia in his maternal grandmother; Lung disease in his father; Other in his sister; Seizures in his sister; Stroke in his mother.    ROS:  Please see the history of present illness.   Otherwise, review of systems is positive for SOB.   All other systems are reviewed and negative.   PHYSICAL EXAM: VS:  BP 114/70   Pulse 69   Ht 5\' 11"  (1.803 m)   Wt 210 lb 3.2 oz (95.3 kg)   BMI 29.32 kg/m  , BMI Body mass index is 29.32 kg/m. GEN: Well nourished, well developed, in no acute distress  HEENT: normal  Neck: no JVD, carotid bruits, or masses Cardiac: RRR; no murmurs, rubs, or gallops,no edema  Respiratory:  clear to auscultation bilaterally, normal work of breathing GI: soft, nontender, nondistended, + BS MS: no deformity or atrophy  Skin: warm and dry Neuro:  Strength and sensation are intact Psych: euthymic mood, full  affect  EKG:  EKG is ordered today. Personal review of the ekg ordered shows SR, low voltage, QTc 462   Recent Labs: 07/08/2017: Magnesium 2.1 10/21/2017: ALT 8; BUN 57; Creatinine, Ser 2.37; Hemoglobin 14.0; Platelets 195.0; Potassium 4.4; Sodium 139; TSH 1.53    Lipid Panel     Component Value Date/Time   CHOL 145 10/21/2017 1207   TRIG 124.0 10/21/2017 1207   HDL 30.50 (L) 10/21/2017 1207   CHOLHDL 5 10/21/2017 1207   VLDL 24.8 10/21/2017 1207   LDLCALC 90 10/21/2017 1207     Wt Readings from Last 3 Encounters:  11/14/17 210 lb 3.2 oz (95.3 kg)  10/21/17 213 lb 12.8 oz (97 kg)  09/23/17 212 lb 6.4 oz (96.3 kg)      Other studies Reviewed: Additional studies/ records that were reviewed today include: TTE 06/16/17  Review of the above records today demonstrates:  - Left ventricle: The cavity size was normal. There was moderate   concentric hypertrophy. Systolic function was moderately to   severely reduced. The estimated ejection fraction was in the   range of 30% to 35%. Severe diffuse hypokinesis with distinct   regional wall motion abnormalities. There is akinesis of the   apical myocardium. The study is not technically sufficient to   allow evaluation of LV diastolic function. - Aortic valve: There was mild to moderate regurgitation directed   eccentrically in the LVOT and towards the mitral anterior   leaflet. - Aorta: Aortic root dimension: 40 mm (ED). Ascending aortic   diameter: 39 mm (S). - Aortic root: The aortic root was mildly dilated. - Ascending aorta: The ascending aorta was mildly dilated. - Mitral valve: Calcified annulus. Mildly thickened leaflets .   There was moderate regurgitation directed eccentrically and   posteriorly. - Left atrium: The atrium was mildly dilated. - Right ventricle: The cavity size was mildly dilated. Wall   thickness was normal. - Right atrium: The atrium was mildly dilated. - Pulmonary arteries: PA peak pressure: 43 mm Hg  (S).  TTE 09/14/17 - Limited study for LVEF. Compared to a prior study in 06/2017, the   LVEF has improved to 42%.  ASSESSMENT AND PLAN:  1.  Paroxysmal atrial fibrillation: Only on dofetilide, Ranexa, and warfarin.  No or minimal episodes of atrial fibrillation.  He  is content with his control.  He has changed his diet and has been eating much healthier.  No changes.  This patients CHA2DS2-VASc Score and unadjusted Ischemic Stroke Rate (% per year) is equal to 3.2 % stroke rate/year from a score of 3  Above score calculated as 1 point each if present [CHF, HTN, DM, Vascular=MI/PAD/Aortic Plaque, Age if 65-74, or Male] Above score calculated as 2 points each if present [Age > 75, or Stroke/TIA/TE]   2.  Chronic systolic and diastolic heart failure: An echo showed an improvement in his ejection fraction.  He is on optimal medical therapy.  He is not qualify for an ICD at this time.  3.  Hypertension: Pressure well controlled today.  No changes.  Current medicines are reviewed at length with the patient today.   The patient does not have concerns regarding his medicines.  The following changes were made today: None  Labs/ tests ordered today include:  Orders Placed This Encounter  Procedures  . EKG 12-Lead     Disposition:   FU with Kainan Patty 6 months  Signed, Marcellene Shivley Meredith [name], MD  11/14/2017 10:43 AM     [facility] [address] [place] [address] [phone] (office) [phone] (fax)

## 2017-11-20 ENCOUNTER — Other Ambulatory Visit: Payer: Self-pay | Admitting: Family Medicine

## 2017-11-25 DIAGNOSIS — R972 Elevated prostate specific antigen [PSA]: Secondary | ICD-10-CM | POA: Diagnosis not present

## 2017-11-30 ENCOUNTER — Ambulatory Visit (INDEPENDENT_AMBULATORY_CARE_PROVIDER_SITE_OTHER): Payer: Medicare Other | Admitting: Pharmacist Clinician (PhC)/ Clinical Pharmacy Specialist

## 2017-11-30 DIAGNOSIS — Z7901 Long term (current) use of anticoagulants: Secondary | ICD-10-CM

## 2017-11-30 DIAGNOSIS — I4891 Unspecified atrial fibrillation: Secondary | ICD-10-CM | POA: Diagnosis not present

## 2017-11-30 DIAGNOSIS — E291 Testicular hypofunction: Secondary | ICD-10-CM | POA: Diagnosis not present

## 2017-11-30 DIAGNOSIS — I480 Paroxysmal atrial fibrillation: Secondary | ICD-10-CM

## 2017-11-30 DIAGNOSIS — R972 Elevated prostate specific antigen [PSA]: Secondary | ICD-10-CM | POA: Diagnosis not present

## 2017-11-30 LAB — POCT INR: INR: 3 (ref 2.0–3.0)

## 2017-12-18 ENCOUNTER — Other Ambulatory Visit: Payer: Self-pay | Admitting: Family Medicine

## 2018-01-01 ENCOUNTER — Other Ambulatory Visit: Payer: Self-pay | Admitting: Family Medicine

## 2018-01-01 ENCOUNTER — Other Ambulatory Visit: Payer: Self-pay | Admitting: Cardiology

## 2018-01-03 ENCOUNTER — Other Ambulatory Visit: Payer: Self-pay | Admitting: Family Medicine

## 2018-01-03 DIAGNOSIS — E1169 Type 2 diabetes mellitus with other specified complication: Secondary | ICD-10-CM

## 2018-01-03 DIAGNOSIS — E669 Obesity, unspecified: Principal | ICD-10-CM

## 2018-01-04 MED ORDER — GLYBURIDE 5 MG PO TABS: 5.0000 mg | ORAL_TABLET | Freq: Every day | ORAL | 0 refills | Status: DC

## 2018-01-05 ENCOUNTER — Other Ambulatory Visit: Payer: Self-pay | Admitting: Family Medicine

## 2018-01-05 DIAGNOSIS — E1169 Type 2 diabetes mellitus with other specified complication: Secondary | ICD-10-CM

## 2018-01-05 DIAGNOSIS — E669 Obesity, unspecified: Principal | ICD-10-CM

## 2018-01-23 ENCOUNTER — Ambulatory Visit (INDEPENDENT_AMBULATORY_CARE_PROVIDER_SITE_OTHER): Payer: Medicare Other | Admitting: Pharmacist Clinician (PhC)/ Clinical Pharmacy Specialist

## 2018-01-23 ENCOUNTER — Encounter: Payer: Self-pay | Admitting: Cardiovascular Disease

## 2018-01-23 ENCOUNTER — Ambulatory Visit (INDEPENDENT_AMBULATORY_CARE_PROVIDER_SITE_OTHER): Payer: Medicare Other | Admitting: Cardiovascular Disease

## 2018-01-23 VITALS — BP 116/72 | HR 71 | Ht 71.0 in | Wt 211.6 lb

## 2018-01-23 DIAGNOSIS — I10 Essential (primary) hypertension: Secondary | ICD-10-CM

## 2018-01-23 DIAGNOSIS — I352 Nonrheumatic aortic (valve) stenosis with insufficiency: Secondary | ICD-10-CM

## 2018-01-23 DIAGNOSIS — I481 Persistent atrial fibrillation: Secondary | ICD-10-CM

## 2018-01-23 DIAGNOSIS — Z5181 Encounter for therapeutic drug level monitoring: Secondary | ICD-10-CM

## 2018-01-23 DIAGNOSIS — Z7901 Long term (current) use of anticoagulants: Secondary | ICD-10-CM

## 2018-01-23 DIAGNOSIS — N183 Chronic kidney disease, stage 3 unspecified: Secondary | ICD-10-CM

## 2018-01-23 DIAGNOSIS — Z79899 Other long term (current) drug therapy: Secondary | ICD-10-CM

## 2018-01-23 DIAGNOSIS — I5042 Chronic combined systolic (congestive) and diastolic (congestive) heart failure: Secondary | ICD-10-CM | POA: Diagnosis not present

## 2018-01-23 DIAGNOSIS — I480 Paroxysmal atrial fibrillation: Secondary | ICD-10-CM | POA: Diagnosis not present

## 2018-01-23 DIAGNOSIS — I4819 Other persistent atrial fibrillation: Secondary | ICD-10-CM

## 2018-01-23 DIAGNOSIS — E663 Overweight: Secondary | ICD-10-CM | POA: Diagnosis not present

## 2018-01-23 DIAGNOSIS — G4733 Obstructive sleep apnea (adult) (pediatric): Secondary | ICD-10-CM

## 2018-01-23 LAB — POCT INR: INR: 3.2 — AB (ref 2.0–3.0)

## 2018-01-23 NOTE — Progress Notes (Signed)
Cardiology Office Note    Date:  01/25/2018   ID:  [name] 1951-05-18, MRN [id number]  PCP:  [name], MD  Cardiologist:   Sanda Klein, MD   Chief Complaint  Patient presents with  . Congestive Heart Failure  . Atrial Fibrillation    History of Present Illness:  [name] is a 67 y.o. male with aortic insufficiency, diastolic heart failure, paroxysmal atrial fibrillation and atrial flutter, obstructive sleep apnea, dyslipidemia in the setting of obesity and type 2 diabetes mellitus.  Generally done well since his last appointment.  He is very conscientious about avoiding sodium in his diet and weighs himself diligently.  He has noticed that he becomes short of breath when his weight exceeds about 208 pounds on his home scale.  Our office scale appears to show readings that are about 6-6-1/2 pounds higher than his home scale.  He noticed that after a day of increased urination and decrease in his weight his breathing returned to baseline.  In January he had transient deterioration left ventricular systolic function after an episode of protracted atrial fibrillation rapid ventricular response and tachycardia cardiomyopathy.  His ejection fraction bottomed out at 30-35%.  After cardioversion and addition of removal seem to dofetilide he has maintained sinus rhythm and has had steady improvement in functional status.  Left ventricular ejection fraction increased to 42% when rechecked in April.  Although he has not lost any additional weight she has managed to stay out of the obese range.  His BMI today is 29.5.  His goal was to reach 195 pounds by his birthday last week, but he weighs 205 pounds on his home scale today.  He reports compliance with CPAP.  He is also compliant with warfarin anticoagulation routine prothrombin time monitoring.  He has not had any bleeding problems.  He has not had any falls.  The patient specifically denies any chest pain at rest  exertion, dyspnea at rest or with exertion, orthopnea, paroxysmal nocturnal dyspnea, syncope, palpitations, focal neurological deficits, intermittent claudication, lower extremity edema, unexplained weight gain, cough, hemoptysis or wheezing.   His echocardiograms have shown moderate aortic insufficiency related to aortic ectasia.  He has a long-standing history of severe hypertension which has not always been well treated. He also has long-standing history of obstructive sleep apnea which was also not treated until recently, but he is now 100% CPAP-compliant. Coronary angiography 2011 did not show evidence of significant stenoses. He presented with typical atrial flutter in 2011 and has recurrent paroxysmal atrial fibrillation with a good response to treatment with dofetilide. Additional problems include obesity, type 2 diabetes mellitus, gout and androgen deficiency. He had a successful cardioversion in October 2016 and February 2019 for persistent atrial fibrillation.   Past Medical History:  Diagnosis Date  . AORTIC STENOSIS, MODERATE 12/05/2009  . Atrial fibrillation ([place])   . Atrial fibrillation with RVR ([place]) 07/05/2017  . Atrial flutter ([place]) 12/05/2009  . Atypical chest pain 11/08/2011  . BENIGN PROSTATIC HYPERTROPHY, HX OF 12/05/2009  . CHEST PAIN-UNSPECIFIED 11/11/2009  . CHF 12/05/2009  . Chronic renal insufficiency 12/16/2016  . Debility 04/18/2012  . Diarrhea 12/16/2016  . DYSPNEA 12/19/2009  . Edema 04/18/2012  . ERECTILE DYSFUNCTION 01/25/2007  . FATIGUE, CHRONIC 11/11/2009  . Gout 07/10/2012  . Heart murmur   . History of kidney stones    "they passed" (07/05/2017)  . Hyperkalemia 04/18/2012  . Hyperlipidemia   . Hypertension   . HYPERTENSION 01/25/2007  .  Hypoxia 05/05/2011  . Insomnia 05/13/2016  . INSOMNIA, HX OF 01/25/2007  . Knee pain, bilateral 12/28/2010  . Knee pain, right 12/28/2010  . NEUROMA 01/05/2010  . OBESITY NOS 01/25/2007  . OSA on CPAP 04/06/2010  . PERIPHERAL NEUROPATHY  01/05/2010  . PULMONARY FUNCTION TESTS, ABNORMAL 02/02/2010  . Superficial thrombophlebitis of left leg 09/07/2011  . TESTICULAR HYPOFUNCTION 01/05/2010  . 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 Toe pain, right 07/10/2012  . Type II diabetes mellitus ([place]) 12/05/2009  . UNSPECIFIED ANEMIA 01/05/2010    Past Surgical History:  Procedure Laterality Date  . CARDIAC CATHETERIZATION  10/16/2009   nonischemic cardiomyopathy  . CARDIOVERSION N/A 03/25/2015   Procedure: CARDIOVERSION;  Surgeon: Pixie Casino, MD;  Location: [facility] ENDOSCOPY;  Service: Cardiovascular;  Laterality: N/A;  . CARDIOVERSION N/A 06/17/2017   Procedure: CARDIOVERSION;  Surgeon: Sanda Klein, MD;  Location: [facility] ENDOSCOPY;  Service: Cardiovascular;  Laterality: N/A;  . CARDIOVERSION N/A 07/08/2017   Procedure: CARDIOVERSION;  Surgeon: Fay Records, MD;  Location: [place] [facility];  Service: Cardiovascular;  Laterality: N/A;  . METATARSAL OSTEOTOMY Bilateral ~ 1980   removed part of 5th metatarsal to corect curvature of toes     Current Medications: Outpatient Medications Prior to Visit  Medication Sig Dispense Refill  . albuterol (VENTOLIN HFA) 108 (90 Base) MCG/ACT inhaler Inhale 2 puffs into the lungs every 6 (six) hours as needed for wheezing or shortness of breath. 1 Inhaler 6  . allopurinol (ZYLOPRIM) 300 MG tablet TAKE 1 TABLET BY MOUTH ONCE DAILY 90 tablet 2  . calcium carbonate (TUMS - DOSED IN MG ELEMENTAL CALCIUM) 500 MG chewable tablet Chew 1 tablet by mouth daily as needed. For heartburn    . cetirizine (ZYRTEC) 10 MG tablet Take 1 tablet (10 mg total) by mouth daily. 30 tablet 11  . Clobetasol Prop Emollient Base (CLOBETASOL PROPIONATE E) 0.05 % emollient cream Apply 1 application topically 2 (two) times daily as needed (insect bite). 60 g 1  . clotrimazole-betamethasone (LOTRISONE) cream Apply 1 application topically 2 (two) times daily. 30 g 1  . colchicine (COLCRYS) 0.6 MG tablet TAKE ONE TABLET BY MOUTH DAILY AS NEEDED FOR GOUT  FLARE UPS. 30 tablet 0  . dofetilide (TIKOSYN) 500 MCG capsule Take 1 capsule (500 mcg total) by mouth 2 (two) times daily. 180 capsule 1  . fish oil-omega-3 fatty acids 1000 MG capsule Take 1 g by mouth daily.     . furosemide (LASIX) 40 MG tablet TAKE TWO TABLETS BY MOUTH EVERY MORNING AND TAKE ONE TABLET BY MOUTH EVERY AFTERNOON 270 tablet 3  . glyBURIDE (DIABETA) 5 MG tablet Take 1 tablet (5 mg total) by mouth daily with breakfast. 90 tablet 0  . HYDROcodone-acetaminophen (NORCO) 10-325 MG tablet Take 1 tablet by mouth every 8 (eight) hours as needed for moderate pain or severe pain. 90 tablet 0  . KLOR-CON M20 20 MEQ tablet TAKE 1 TABLET BY MOUTH ONCE DAILY AND  EXTRA  TABLET  IF  WEIGHT  IS  ABOVE  210  LBS 180 tablet 2  . LORazepam (ATIVAN) 1 MG tablet Take 1 tablet (1 mg total) by mouth every 8 (eight) hours as needed for anxiety. (Patient taking differently: Take 0.5 mg by mouth every 8 (eight) hours as needed for anxiety. ) 30 tablet 3  . magnesium oxide (MAG-OX) 400 MG tablet Take 1 tablet (400 mg total) by mouth daily. 30 tablet 11  . metFORMIN (GLUCOPHAGE) 500 MG tablet TAKE 1 TABLET BY  MOUTH THREE TIMES DAILY 270 tablet 0  . metoprolol succinate (TOPROL-XL) 100 MG 24 hr tablet TAKE 1 TABLET BY MOUTH TWICE DAILY. TAKE WITH OR IMMEDIATELY FOLLOWING A MEAL. 180 tablet 0  . Multiple Vitamins-Minerals (MULTIVITAMIN PO) Take 1 tablet by mouth daily.    . pravastatin (PRAVACHOL) 40 MG tablet TAKE 1 TABLET BY MOUTH ONCE DAILY 90 tablet 3  . ranolazine (RANEXA) 500 MG 12 hr tablet Take 1 tablet (500 mg total) by mouth 2 (two) times daily. 60 tablet 5  . Testosterone 10 MG/ACT (2%) GEL Apply 1 application topically daily. Use as directed    . warfarin (COUMADIN) 4 MG tablet TAKE 1/2 TO 1 (ONE-HALF TO ONE) TABLET BY MOUTH DAILY OR AS DIRECTED BY COUMADIN CLINIC 90 tablet 1  . benazepril (LOTENSIN) 10 MG tablet Take 10 mg by mouth daily.  3  . benazepril (LOTENSIN) 40 MG tablet Take 40 mg by mouth  daily.  3  . naproxen (NAPROSYN) 375 MG tablet Take 375 mg by mouth daily as needed for mild pain.    [name] temazepam (RESTORIL) 30 MG capsule TAKE 1 CAPSULE BY MOUTH AT BEDTIME AS NEEDED FOR SLEEP 30 capsule 3   No facility-administered medications prior to visit.      Allergies:   Sulfonamide derivatives and Zolpidem   Social History   Socioeconomic History  . Marital status: Married    Spouse name: Not on file  . Number of children: Not on file  . Years of education: Not on file  . Highest education level: Not on file  Occupational History  . Not on file  Social Needs  . Financial resource strain: Not on file  . Food insecurity:    Worry: Not on file    Inability: Not on file  . Transportation needs:    Medical: Not on file    Non-medical: Not on file  Tobacco Use  . Smoking status: Former Smoker    Packs/day: 1.00    Years: 10.00    Pack years: 10.00    Last attempt to quit: 06/07/1980    Years since quitting: 37.6  . Smokeless tobacco: Never Used  Substance and Sexual Activity  . Alcohol use: No  . Drug use: No  . Sexual activity: Not Currently  Lifestyle  . Physical activity:    Days per week: Not on file    Minutes per session: Not on file  . Stress: Not on file  Relationships  . Social connections:    Talks on phone: Not on file    Gets together: Not on file    Attends religious service: Not on file    Active member of club or organization: Not on file    Attends meetings of clubs or organizations: Not on file    Relationship status: Not on file  Other Topics Concern  . Not on file  Social History Narrative  . Not on file     Family History:  The patient's family history includes Aneurysm in his sister; Cancer in his father; Clotting disorder in his mother; Diabetes in his mother and sister; Heart attack in his mother; Heart disease in his mother; Hyperlipidemia in his mother; Hypertension in his mother and sister; Leukemia in his maternal grandmother; Lung  disease in his father; Other in his sister; Seizures in his sister; Stroke in his mother.   ROS:   Please see the history of present illness.    ROS All other systems reviewed and are  negative.   PHYSICAL EXAM:   VS:  BP 116/72   Pulse 71   Ht 5\' 11"  (1.803 m)   Wt 211 lb 9.6 oz (96 kg)   BMI 29.51 kg/m     General: Alert, oriented x3, no distress, overweight Head: no evidence of trauma, PERRL, EOMI, no exophtalmos or lid lag, no myxedema, no xanthelasma; normal ears, nose and oropharynx Neck: normal jugular venous pulsations and no hepatojugular reflux; brisk carotid pulses without delay and no carotid bruits Chest: clear to auscultation, no signs of consolidation by percussion or palpation, normal fremitus, symmetrical and full respiratory excursions Cardiovascular: normal position and quality of the apical impulse, regular rhythm, normal first and paradoxically split second heart sounds, 1/6 early aortic ejection murmur, 2-3/6 decrescendo aortic insufficiency murmur heard best at the left lower sternal border, no rubs or gallops Abdomen: no tenderness or distention, no masses by palpation, no abnormal pulsatility or arterial bruits, normal bowel sounds, no hepatosplenomegaly Extremities: no clubbing, cyanosis or edema; 2+ radial, ulnar and brachial pulses bilaterally; 2+ right femoral, posterior tibial and dorsalis pedis pulses; 2+ left femoral, posterior tibial and dorsalis pedis pulses; no subclavian or femoral bruits Neurological: grossly nonfocal Psych: Normal mood and affect   Wt Readings from Last 3 Encounters:  01/24/18 212 lb 6.4 oz (96.3 kg)  01/23/18 211 lb 9.6 oz (96 kg)  11/14/17 210 lb 3.2 oz (95.3 kg)   Studies/Labs Reviewed:   EKG:  EKG is ordered today.  It shows sinus rhythm with first-degree AV block and incomplete left bundle branch block, QTC 493 ms 07/08/2017: Magnesium 2.1 01/24/2018: ALT 10; BUN 46; Creatinine, Ser 2.52; Hemoglobin 14.9; Platelets 220.0;  Potassium 4.9; Sodium 141; TSH 1.99   Lipid Panel    Component Value Date/Time   CHOL 147 01/24/2018 1155   TRIG 156.0 (H) 01/24/2018 1155   HDL 32.60 (L) 01/24/2018 1155   CHOLHDL 5 01/24/2018 1155   VLDL 31.2 01/24/2018 1155   LDLCALC 83 01/24/2018 1155     ASSESSMENT:    1. Chronic combined systolic and diastolic heart failure (HCC)   2. Persistent atrial fibrillation ([place])   3. Nonrheumatic aortic insufficiency with aortic stenosis   4. OSA (obstructive sleep apnea)   5. Overweight   6. Essential hypertension   7. Long term current use of anticoagulant   8. Encounter for monitoring dofetilide therapy   9. CKD (chronic kidney disease) stage 3, GFR 30-59 ml/min (HCC)      PLAN:  In order of problems listed above:  1. CHF (predominantly diastolic): Dry weight seems to be less than 208 pounds on his home scale (less than 214 pounds on our office scale).  Appears clinically euvolemic today.  NYHA functional class I-2.  On appropriate medical therapy with ACE inhibitor and beta-blocker.  Requires relatively high dose of daily maintain euvolemia. 2. AFib:   Overall he has had a good record of success with dofetilide and Ranexa.  QTc today is well within acceptable range, especially when allowing for the QRS prolongation. He has a Kardia device to detect the arrhythmia, which is important as he was never aware of it.  He has developed tachycardia cardiomyopathy on 2 different occasions, both leading to hospitalizations. He was intolerant of amiodarone due to intractable nausea and vomiting. CHADSVAsc 3 (HTN, CHF, DM). Reminded him of the risk of  multiple drug interactions with dofetilide and with warfarin. 3. Moderate AI: On multiple echoes this has always been moderate, never appeared severe enough  to be the cause of cardiomyopathy. 4. OSA: He reports 100% compliance with CPAP. He has difficulty falling asleep and staying asleep and is taking benzodiazepines. 5.  Overweight:  Congratulated on weight loss.  Even though he did not meet his weight loss goal, his efforts have been excellent. 6. HTN: Excellent control.  If he continues to lose weight and will have to back off his medications.  He does need to stay on a high dose of beta-blocker in case he has recurrent atrial arrhythmia. 7. Warfarin: Excellent compliance with follow-up, 8. Tikosyn: QT interval in desirable range.  Monitor but need to monitor potassium level every 6 months. 9. CKD: His most recent renal parameters from May showed substantial worsening from his baseline renal function.  I am not sure if this is a temporary event or stage of disease progression.  We will repeat renal parameters today.  I think he might need referral to a nephrologist.  At this point, his GFR is borderline acceptable for continued treatment with dofetilide and ranolazine.  Medication Adjustments/Labs and Tests Ordered: Current medicines are reviewed at length with the patient today.  Concerns regarding medicines are outlined above.  Medication changes, Labs and Tests ordered today are listed in the Patient Instructions below. Patient Instructions  Dr Sallyanne Kuster recommends that you schedule a follow-up appointment in 6 months. You will receive a reminder letter in the mail two months in advance. If you don't receive a letter, please call our office to schedule the follow-up appointment.  If you need a refill on your cardiac medications before your next appointment, please call your pharmacy.    Signed, Sanda Klein, MD  01/25/2018 9:06 AM    [facility] [address], [place], [place]  [address] Phone: [phone]; Fax: [phone]

## 2018-01-23 NOTE — Patient Instructions (Signed)
Dr Croitoru recommends that you schedule a follow-up appointment in 6 months. You will receive a reminder letter in the mail two months in advance. If you don't receive a letter, please call our office to schedule the follow-up appointment.  If you need a refill on your cardiac medications before your next appointment, please call your pharmacy. 

## 2018-01-24 ENCOUNTER — Ambulatory Visit (INDEPENDENT_AMBULATORY_CARE_PROVIDER_SITE_OTHER): Payer: Medicare Other | Admitting: Family Medicine

## 2018-01-24 ENCOUNTER — Other Ambulatory Visit: Payer: Self-pay | Admitting: Family Medicine

## 2018-01-24 ENCOUNTER — Encounter: Payer: Self-pay | Admitting: Family Medicine

## 2018-01-24 VITALS — BP 126/66 | HR 67 | Temp 97.6°F | Resp 18 | Wt 212.4 lb

## 2018-01-24 DIAGNOSIS — E1169 Type 2 diabetes mellitus with other specified complication: Secondary | ICD-10-CM | POA: Diagnosis not present

## 2018-01-24 DIAGNOSIS — N183 Chronic kidney disease, stage 3 (moderate): Principal | ICD-10-CM

## 2018-01-24 DIAGNOSIS — I4891 Unspecified atrial fibrillation: Secondary | ICD-10-CM

## 2018-01-24 DIAGNOSIS — E782 Mixed hyperlipidemia: Secondary | ICD-10-CM | POA: Diagnosis not present

## 2018-01-24 DIAGNOSIS — I10 Essential (primary) hypertension: Secondary | ICD-10-CM

## 2018-01-24 DIAGNOSIS — E669 Obesity, unspecified: Secondary | ICD-10-CM | POA: Diagnosis not present

## 2018-01-24 DIAGNOSIS — N179 Acute kidney failure, unspecified: Secondary | ICD-10-CM

## 2018-01-24 LAB — LIPID PANEL
Cholesterol: 147 mg/dL (ref 0–200)
HDL: 32.6 mg/dL — ABNORMAL LOW (ref >39.00)
LDL CALC: 83 mg/dL (ref 0–99)
NONHDL: 114.42
Total CHOL/HDL Ratio: 5
Triglycerides: 156 mg/dL — ABNORMAL HIGH (ref 0.0–149.0)
VLDL: 31.2 mg/dL (ref 0.0–40.0)

## 2018-01-24 LAB — TSH: TSH: 1.99 u[IU]/mL (ref 0.35–4.50)

## 2018-01-24 LAB — COMPREHENSIVE METABOLIC PANEL
ALT: 10 U/L (ref 0–53)
AST: 13 U/L (ref 0–37)
Albumin: 4.3 g/dL (ref 3.5–5.2)
Alkaline Phosphatase: 45 U/L (ref 39–117)
BUN: 46 mg/dL — AB (ref 6–23)
CHLORIDE: 104 meq/L (ref 96–112)
CO2: 29 mEq/L (ref 19–32)
CREATININE: 2.52 mg/dL — AB (ref 0.40–1.50)
Calcium: 10.1 mg/dL (ref 8.4–10.5)
GFR: 33.19 mL/min — ABNORMAL LOW (ref >60.00)
GLUCOSE: 151 mg/dL — AB (ref 70–99)
POTASSIUM: 4.9 meq/L (ref 3.5–5.1)
SODIUM: 141 meq/L (ref 135–145)
Total Bilirubin: 0.8 mg/dL (ref 0.2–1.2)
Total Protein: 7.5 g/dL (ref 6.0–8.3)

## 2018-01-24 LAB — HEMOGLOBIN A1C: Hgb A1c MFr Bld: 6.7 % — ABNORMAL HIGH (ref 4.6–6.5)

## 2018-01-24 LAB — CBC
HEMATOCRIT: 48.2 % (ref 39.0–52.0)
Hemoglobin: 14.9 g/dL (ref 13.0–17.0)
MCHC: 31 g/dL (ref 30.0–36.0)
MCV: 85.1 fl (ref 78.0–100.0)
Platelets: 220 10*3/uL (ref 150.0–400.0)
RBC: 5.66 Mil/uL (ref 4.22–5.81)
RDW: 17.5 % — ABNORMAL HIGH (ref 11.5–15.5)
WBC: 4.4 10*3/uL (ref 4.0–10.5)

## 2018-01-24 MED ORDER — TEMAZEPAM 30 MG PO CAPS: ORAL_CAPSULE | ORAL | 5 refills | Status: DC

## 2018-01-24 MED ORDER — BENAZEPRIL HCL 10 MG PO TABS: 5.0000 mg | ORAL_TABLET | Freq: Every day | ORAL | 3 refills | Status: DC

## 2018-01-24 NOTE — Patient Instructions (Signed)

## 2018-01-24 NOTE — Progress Notes (Signed)
Subjective:  I acted as a Education administrator for Dr. Charlett Blake. [place], [place]  Patient ID: [name], male    DOB: 1950/06/29, 67 y.o.   MRN: [id number]  No chief complaint on file.   HPI  Patient is in today for 3 month and overall he is doing well. No recent febrile illness or hospitalizations. No polyuria or polydipsia. He continues to struggle with fatigue and insomnia. Restoril works as well as anything he has tried and he is requesting a refill. Denies CP/palp/HA/congestion/fevers/GI or GU c/o. Taking meds as prescribed  Patient Care Team: [name], MD as PCP - General Croitoru, Dani Gobble, MD as PCP - Cardiology (Cardiology) Constance Haw, MD as PCP - Electrophysiology (Cardiology) Croitoru, Dani Gobble, MD (Cardiology) [place], Jennefer Bravo, MD as Consulting Physician (Dermatology) Rigoberto Noel, MD as Consulting Physician (Pulmonary Disease) Irine Seal, MD as Consulting Physician (Urology)   Past Medical History:  Diagnosis Date  . AORTIC STENOSIS, MODERATE 12/05/2009  . Atrial fibrillation ([place])   . Atrial fibrillation with RVR ([place]) 07/05/2017  . Atrial flutter ([place]) 12/05/2009  . Atypical chest pain 11/08/2011  . BENIGN PROSTATIC HYPERTROPHY, HX OF 12/05/2009  . CHEST PAIN-UNSPECIFIED 11/11/2009  . CHF 12/05/2009  . Chronic renal insufficiency 12/16/2016  . Debility 04/18/2012  . Diarrhea 12/16/2016  . DYSPNEA 12/19/2009  . Edema 04/18/2012  . ERECTILE DYSFUNCTION 01/25/2007  . FATIGUE, CHRONIC 11/11/2009  . Gout 07/10/2012  . Heart murmur   . History of kidney stones    "they passed" (07/05/2017)  . Hyperkalemia 04/18/2012  . Hyperlipidemia   . Hypertension   . HYPERTENSION 01/25/2007  . Hypoxia 05/05/2011  . Insomnia 05/13/2016  . INSOMNIA, HX OF 01/25/2007  . Knee pain, bilateral 12/28/2010  . Knee pain, right 12/28/2010  . NEUROMA 01/05/2010  . OBESITY NOS 01/25/2007  . OSA on CPAP 04/06/2010  . PERIPHERAL NEUROPATHY 01/05/2010  . PULMONARY FUNCTION TESTS, ABNORMAL 02/02/2010  .  Superficial thrombophlebitis of left leg 09/07/2011  . TESTICULAR HYPOFUNCTION 01/05/2010  . 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 Toe pain, right 07/10/2012  . Type II diabetes mellitus ([place]) 12/05/2009  . UNSPECIFIED ANEMIA 01/05/2010    Past Surgical History:  Procedure Laterality Date  . CARDIAC CATHETERIZATION  10/16/2009   nonischemic cardiomyopathy  . CARDIOVERSION N/A 03/25/2015   Procedure: CARDIOVERSION;  Surgeon: Pixie Casino, MD;  Location: [facility] ENDOSCOPY;  Service: Cardiovascular;  Laterality: N/A;  . CARDIOVERSION N/A 06/17/2017   Procedure: CARDIOVERSION;  Surgeon: Sanda Klein, MD;  Location: [place] ENDOSCOPY;  Service: Cardiovascular;  Laterality: N/A;  . CARDIOVERSION N/A 07/08/2017   Procedure: CARDIOVERSION;  Surgeon: Fay Records, MD;  Location: [facility] ENDOSCOPY;  Service: Cardiovascular;  Laterality: N/A;  . METATARSAL OSTEOTOMY Bilateral ~ 1980   removed part of 5th metatarsal to corect curvature of toes     Family History  Problem Relation Age of Onset  . Clotting disorder Mother   . Heart disease Mother        s/p MI  . Heart attack Mother   . Hypertension Mother   . Diabetes Mother   . Hyperlipidemia Mother   . Stroke Mother   . Cancer Father        ? lung  . Lung disease Father        smoker  . Diabetes Sister   . Hypertension Sister        smoker  . Leukemia Maternal Grandmother        ?  [name] Aneurysm  Sister        brain  . Other Sister        clipped  . Seizures Sister        d/o w/aneurysm/ smoker    Social History   Socioeconomic History  . Marital status: Married    Spouse name: Not on file  . Number of children: Not on file  . Years of education: Not on file  . Highest education level: Not on file  Occupational History  . Not on file  Social Needs  . Financial resource strain: Not on file  . Food insecurity:    Worry: Not on file    Inability: Not on file  . Transportation needs:    Medical: Not on file    Non-medical: Not on file  Tobacco Use    . Smoking status: Former Smoker    Packs/day: 1.00    Years: 10.00    Pack years: 10.00    Last attempt to quit: 06/07/1980    Years since quitting: 37.6  . Smokeless tobacco: Never Used  Substance and Sexual Activity  . Alcohol use: No  . Drug use: No  . Sexual activity: Not Currently  Lifestyle  . Physical activity:    Days per week: Not on file    Minutes per session: Not on file  . Stress: Not on file  Relationships  . Social connections:    Talks on phone: Not on file    Gets together: Not on file    Attends religious service: Not on file    Active member of club or organization: Not on file    Attends meetings of clubs or organizations: Not on file    Relationship status: Not on file  . Intimate partner violence:    Fear of current or ex partner: Not on file    Emotionally abused: Not on file    Physically abused: Not on file    Forced sexual activity: Not on file  Other Topics Concern  . Not on file  Social History Narrative  . Not on file    Outpatient Medications Prior to Visit  Medication Sig Dispense Refill  . albuterol (VENTOLIN HFA) 108 (90 Base) MCG/ACT inhaler Inhale 2 puffs into the lungs every 6 (six) hours as needed for wheezing or shortness of breath. 1 Inhaler 6  . allopurinol (ZYLOPRIM) 300 MG tablet TAKE 1 TABLET BY MOUTH ONCE DAILY 90 tablet 2  . calcium carbonate (TUMS - DOSED IN MG ELEMENTAL CALCIUM) 500 MG chewable tablet Chew 1 tablet by mouth daily as needed. For heartburn    . cetirizine (ZYRTEC) 10 MG tablet Take 1 tablet (10 mg total) by mouth daily. 30 tablet 11  . Clobetasol Prop Emollient Base (CLOBETASOL PROPIONATE E) 0.05 % emollient cream Apply 1 application topically 2 (two) times daily as needed (insect bite). 60 g 1  . clotrimazole-betamethasone (LOTRISONE) cream Apply 1 application topically 2 (two) times daily. 30 g 1  . colchicine (COLCRYS) 0.6 MG tablet TAKE ONE TABLET BY MOUTH DAILY AS NEEDED FOR GOUT FLARE UPS. 30 tablet 0  .  dofetilide (TIKOSYN) 500 MCG capsule Take 1 capsule (500 mcg total) by mouth 2 (two) times daily. 180 capsule 1  . fish oil-omega-3 fatty acids 1000 MG capsule Take 1 g by mouth daily.     . furosemide (LASIX) 40 MG tablet TAKE TWO TABLETS BY MOUTH EVERY MORNING AND TAKE ONE TABLET BY MOUTH EVERY AFTERNOON 270 tablet 3  .  glyBURIDE (DIABETA) 5 MG tablet Take 1 tablet (5 mg total) by mouth daily with breakfast. 90 tablet 0  . HYDROcodone-acetaminophen (NORCO) 10-325 MG tablet Take 1 tablet by mouth every 8 (eight) hours as needed for moderate pain or severe pain. 90 tablet 0  . KLOR-CON M20 20 MEQ tablet TAKE 1 TABLET BY MOUTH ONCE DAILY AND  EXTRA  TABLET  IF  WEIGHT  IS  ABOVE  210  LBS 180 tablet 2  . LORazepam (ATIVAN) 1 MG tablet Take 1 tablet (1 mg total) by mouth every 8 (eight) hours as needed for anxiety. (Patient taking differently: Take 0.5 mg by mouth every 8 (eight) hours as needed for anxiety. ) 30 tablet 3  . magnesium oxide (MAG-OX) 400 MG tablet Take 1 tablet (400 mg total) by mouth daily. 30 tablet 11  . metFORMIN (GLUCOPHAGE) 500 MG tablet TAKE 1 TABLET BY MOUTH THREE TIMES DAILY 270 tablet 0  . metoprolol succinate (TOPROL-XL) 100 MG 24 hr tablet TAKE 1 TABLET BY MOUTH TWICE DAILY. TAKE WITH OR IMMEDIATELY FOLLOWING A MEAL. 180 tablet 0  . Multiple Vitamins-Minerals (MULTIVITAMIN PO) Take 1 tablet by mouth daily.    . pravastatin (PRAVACHOL) 40 MG tablet TAKE 1 TABLET BY MOUTH ONCE DAILY 90 tablet 3  . ranolazine (RANEXA) 500 MG 12 hr tablet Take 1 tablet (500 mg total) by mouth 2 (two) times daily. 60 tablet 5  . Testosterone 10 MG/ACT (2%) GEL Apply 1 application topically daily. Use as directed    . warfarin (COUMADIN) 4 MG tablet TAKE 1/2 TO 1 (ONE-HALF TO ONE) TABLET BY MOUTH DAILY OR AS DIRECTED BY COUMADIN CLINIC 90 tablet 1  . benazepril (LOTENSIN) 10 MG tablet Take 10 mg by mouth daily.  3  . naproxen (NAPROSYN) 375 MG tablet Take 375 mg by mouth daily as needed for mild  pain.    [name] temazepam (RESTORIL) 30 MG capsule TAKE 1 CAPSULE BY MOUTH AT BEDTIME AS NEEDED FOR SLEEP 30 capsule 3   No facility-administered medications prior to visit.     Allergies  Allergen Reactions  . Sulfonamide Derivatives Other (See Comments)    unknown reaction.  Patient states had to go to the ER.   [name] Zolpidem Other (See Comments)    Excessive, prolonged sedation    Review of Systems  Constitutional: Positive for malaise/fatigue. Negative for fever.  HENT: Negative for congestion.   Eyes: Negative for blurred vision.  Respiratory: Negative for shortness of breath.   Cardiovascular: Negative for chest pain, palpitations and leg swelling.  Gastrointestinal: Negative for abdominal pain, blood in stool and nausea.  Genitourinary: Negative for dysuria and frequency.  Musculoskeletal: Negative for falls.  Skin: Negative for rash.  Neurological: Negative for dizziness, loss of consciousness and headaches.  Endo/Heme/Allergies: Negative for environmental allergies.  Psychiatric/Behavioral: Negative for depression. The patient has insomnia. The patient is not nervous/anxious.        Objective:    Physical Exam  Constitutional: He is oriented to person, place, and time. He appears well-developed and well-nourished. No distress.  HENT:  Head: Normocephalic and atraumatic.  Nose: Nose normal.  Eyes: Right eye exhibits no discharge. Left eye exhibits no discharge.  Neck: Normal range of motion. Neck supple.  Cardiovascular: Normal rate and regular rhythm.  No murmur heard. Pulmonary/Chest: Effort normal and breath sounds normal.  Abdominal: Soft. Bowel sounds are normal. There is no tenderness.  Musculoskeletal: He exhibits no edema.  Neurological: He is alert and oriented to  person, place, and time.  Skin: Skin is warm and dry.  Psychiatric: He has a normal mood and affect.  Nursing note and vitals reviewed.   BP 126/66 (BP Location: Left Arm, Patient Position:  Sitting, Cuff Size: Normal)   Pulse 67   Temp 97.6 F (36.4 C) (Oral)   Resp 18   Wt 212 lb 6.4 oz (96.3 kg)   SpO2 95%   BMI 29.62 kg/m  Wt Readings from Last 3 Encounters:  01/24/18 212 lb 6.4 oz (96.3 kg)  01/23/18 211 lb 9.6 oz (96 kg)  11/14/17 210 lb 3.2 oz (95.3 kg)   BP Readings from Last 3 Encounters:  01/24/18 126/66  01/23/18 116/72  11/14/17 114/70     Immunization History  Administered Date(s) Administered  . Influenza Split 03/03/2011, 03/10/2012  . Influenza Whole 03/28/2008, 03/30/2010  . Influenza,inj,Quad PF,6+ Mos 01/31/2013, 03/07/2014, 04/14/2015, 02/19/2016, 03/18/2017  . Pneumococcal Polysaccharide-23 10/07/2009  . Td 09/05/2009    Health Maintenance  Topic Date Due  . OPHTHALMOLOGY EXAM  01/07/1963  . COLONOSCOPY  01/07/2003  . FOOT EXAM  08/12/2017  . INFLUENZA VACCINE  01/05/2018  . PNA vac Low Risk Adult (1 of 2 - PCV13) 01/06/2018  . HEMOGLOBIN A1C  07/27/2018  . TETANUS/TDAP  09/06/2019  . Hepatitis C Screening  Addressed  . HIV Screening  Addressed    Lab Results  Component Value Date   WBC 4.4 01/24/2018   HGB 14.9 01/24/2018   HCT 48.2 01/24/2018   PLT 220.0 01/24/2018   GLUCOSE 151 (H) 01/24/2018   CHOL 147 01/24/2018   TRIG 156.0 (H) 01/24/2018   HDL 32.60 (L) 01/24/2018   LDLCALC 83 01/24/2018   ALT 10 01/24/2018   AST 13 01/24/2018   NA 141 01/24/2018   K 4.9 01/24/2018   CL 104 01/24/2018   CREATININE 2.52 (H) 01/24/2018   BUN 46 (H) 01/24/2018   CO2 29 01/24/2018   TSH 1.99 01/24/2018   PSA 4.26 (H) 12/29/2009   INR 3.2 (A) 01/23/2018   HGBA1C 6.7 (H) 01/24/2018   MICROALBUR 1.1 04/14/2015    Lab Results  Component Value Date   TSH 1.99 01/24/2018   Lab Results  Component Value Date   WBC 4.4 01/24/2018   HGB 14.9 01/24/2018   HCT 48.2 01/24/2018   MCV 85.1 01/24/2018   PLT 220.0 01/24/2018   Lab Results  Component Value Date   NA 141 01/24/2018   K 4.9 01/24/2018   CO2 29 01/24/2018   GLUCOSE  151 (H) 01/24/2018   BUN 46 (H) 01/24/2018   CREATININE 2.52 (H) 01/24/2018   BILITOT 0.8 01/24/2018   ALKPHOS 45 01/24/2018   AST 13 01/24/2018   ALT 10 01/24/2018   PROT 7.5 01/24/2018   ALBUMIN 4.3 01/24/2018   CALCIUM 10.1 01/24/2018   ANIONGAP 11 07/08/2017   GFR 33.19 (L) 01/24/2018   Lab Results  Component Value Date   CHOL 147 01/24/2018   Lab Results  Component Value Date   HDL 32.60 (L) 01/24/2018   Lab Results  Component Value Date   LDLCALC 83 01/24/2018   Lab Results  Component Value Date   TRIG 156.0 (H) 01/24/2018   Lab Results  Component Value Date   CHOLHDL 5 01/24/2018   Lab Results  Component Value Date   HGBA1C 6.7 (H) 01/24/2018         Assessment & Plan:   Problem List Items Addressed This Visit    Diabetes mellitus  type 2 in obese (HCC) - Primary   Relevant Medications   benazepril (LOTENSIN) 10 MG tablet   Other Relevant Orders   Hemoglobin A1c (Completed)   Hyperlipidemia   Relevant Medications   benazepril (LOTENSIN) 10 MG tablet   Other Relevant Orders   Lipid panel (Completed)   Essential hypertension, malignant   Relevant Medications   benazepril (LOTENSIN) 10 MG tablet   Other Relevant Orders   CBC (Completed)   Comprehensive metabolic panel (Completed)   TSH (Completed)   Atrial fibrillation with RVR (HCC)   Relevant Medications   benazepril (LOTENSIN) 10 MG tablet      I have discontinued Josaiah H. Gagan's naproxen. I have also changed his benazepril. Additionally, I am having him maintain his fish oil-omega-3 fatty acids, calcium carbonate, Multiple Vitamins-Minerals (MULTIVITAMIN PO), Testosterone, cetirizine, magnesium oxide, colchicine, LORazepam, albuterol, Clobetasol Prop Emollient Base, furosemide, ranolazine, pravastatin, warfarin, KLOR-CON M20, allopurinol, dofetilide, HYDROcodone-acetaminophen, clotrimazole-betamethasone, metFORMIN, metoprolol succinate, glyBURIDE, and temazepam.  Meds ordered this  encounter  Medications  . temazepam (RESTORIL) 30 MG capsule    Sig: TAKE 1 CAPSULE BY MOUTH AT BEDTIME AS NEEDED FOR SLEEP    Dispense:  30 capsule    Refill:  5  . benazepril (LOTENSIN) 10 MG tablet    Sig: Take 0.5 tablets (5 mg total) by mouth daily.    Refill:  3    CMA served as Education administrator during this visit. History, Physical and Plan performed by medical provider. Documentation and orders reviewed and attested to.  Penni Homans, MD

## 2018-01-25 ENCOUNTER — Encounter: Payer: Self-pay | Admitting: Cardiovascular Disease

## 2018-01-26 ENCOUNTER — Telehealth: Payer: Self-pay | Admitting: Cardiovascular Disease

## 2018-01-26 NOTE — Telephone Encounter (Signed)
New Message:   Pt requesting a call back regarding blood work form Dr. Willette Alma

## 2018-01-26 NOTE — Telephone Encounter (Signed)
Ranexa does not cause kidney injury and cannot explain the worsening kidney function.  However his worsening kidney function is a good reason to stop the Ranexa to reduce the likelihood of side effects due to interactions with other medications.  Please ask him to stop Ranexa.  I see that Dr. Charlett Blake has already started the process of referring him to a nephrologist

## 2018-01-26 NOTE — Telephone Encounter (Signed)
Returned call to pt he states that when he was here for appt 10-23-17 and Dr told him to call and let him know when labs were don by pcp he states that the were resulted 8-20 by Dr Willette Alma and he wanted to let Dr Sallyanne Kuster know that the results are in for him to review. Pt states that he started ranexa in February and he thinks that this is the cause for his creatinine rising. And would like to know what he recommendations were. Please advise

## 2018-01-26 NOTE — Telephone Encounter (Signed)
Pt notified of Dr Lurline Del message and to stop Ranexa. He will make sure to go to his nephrologist appt and he will further discuss this with them

## 2018-02-09 DIAGNOSIS — H469 Unspecified optic neuritis: Secondary | ICD-10-CM | POA: Diagnosis not present

## 2018-02-15 ENCOUNTER — Ambulatory Visit (INDEPENDENT_AMBULATORY_CARE_PROVIDER_SITE_OTHER): Payer: Medicare Other | Admitting: Pharmacist

## 2018-02-15 DIAGNOSIS — Z7901 Long term (current) use of anticoagulants: Secondary | ICD-10-CM | POA: Diagnosis not present

## 2018-02-15 DIAGNOSIS — I480 Paroxysmal atrial fibrillation: Secondary | ICD-10-CM | POA: Diagnosis not present

## 2018-02-15 LAB — POCT INR: INR: 2.9 (ref 2.0–3.0)

## 2018-02-16 ENCOUNTER — Other Ambulatory Visit: Payer: Self-pay | Admitting: Ophthalmology

## 2018-02-16 DIAGNOSIS — H469 Unspecified optic neuritis: Secondary | ICD-10-CM

## 2018-02-25 ENCOUNTER — Ambulatory Visit
Admission: RE | Admit: 2018-02-25 | Discharge: 2018-02-25 | Disposition: A | Discharge status: Home / Self Care | Payer: Medicare Other | Source: Ambulatory Visit | Attending: Ophthalmology | Admitting: Ophthalmology

## 2018-02-25 DIAGNOSIS — H469 Unspecified optic neuritis: Secondary | ICD-10-CM

## 2018-02-25 DIAGNOSIS — G9389 Other specified disorders of brain: Secondary | ICD-10-CM | POA: Diagnosis not present

## 2018-02-25 MED ORDER — GADOBENATE DIMEGLUMINE 529 MG/ML IV SOLN
20.0000 mL | Freq: Once | INTRAVENOUS | Status: AC | PRN
  Administered 2018-02-25: 20 mL via INTRAVENOUS

## 2018-03-14 DIAGNOSIS — H401131 Primary open-angle glaucoma, bilateral, mild stage: Secondary | ICD-10-CM | POA: Diagnosis not present

## 2018-03-15 ENCOUNTER — Ambulatory Visit (INDEPENDENT_AMBULATORY_CARE_PROVIDER_SITE_OTHER): Payer: Medicare Other | Admitting: Pharmacist Clinician (PhC)/ Clinical Pharmacy Specialist

## 2018-03-15 DIAGNOSIS — I4891 Unspecified atrial fibrillation: Secondary | ICD-10-CM

## 2018-03-15 DIAGNOSIS — I480 Paroxysmal atrial fibrillation: Secondary | ICD-10-CM | POA: Diagnosis not present

## 2018-03-15 DIAGNOSIS — Z7901 Long term (current) use of anticoagulants: Secondary | ICD-10-CM

## 2018-03-15 LAB — POCT INR: INR: 2.5 (ref 2.0–3.0)

## 2018-03-15 NOTE — Patient Instructions (Signed)
Description   Continue 1/2 tablet daily except 1 tablet each Monday and Friday. Next INR in 6 weeks

## 2018-03-16 ENCOUNTER — Telehealth: Payer: Self-pay

## 2018-03-16 DIAGNOSIS — R9089 Other abnormal findings on diagnostic imaging of central nervous system: Secondary | ICD-10-CM

## 2018-03-16 NOTE — Telephone Encounter (Signed)
So it looks like he has some chronic ischemic changes associated most often with history of hypertension. We can refer to neurology for review and consultation to confirm if he would agree

## 2018-03-16 NOTE — Telephone Encounter (Signed)
Copied from [place] [id number]. Topic: General - Other >> Mar 15, 2018 12:57 PM Wynetta Emery, [place] C wrote: Reason for CRM: Pt called in to be advised by his provider. Pt says that he had a MRI completed on 02/25/18 and would like to be advised by provider. Pt says that he was told by imaging that there are things that they have concerns about   CB: [phone]   Please advise

## 2018-03-20 ENCOUNTER — Telehealth: Payer: Self-pay | Admitting: Cardiovascular Disease

## 2018-03-20 NOTE — Telephone Encounter (Signed)
Message routed to Dr.Croitoru to adv

## 2018-03-20 NOTE — Telephone Encounter (Signed)
There is no way to precisely time the age of the stroke seen on MRI, just able to say it is not recent (could be 3 months or 67 years old). It could well have occurred before we made his diagnosis of atrial flutter/atrial fibrillation and started anticoagulation about 8 years ago. It does not change the current recommended cardiac therapy. MCr

## 2018-03-20 NOTE — Telephone Encounter (Signed)
New Message   Patient is calling because he just recently had a MRI that was requested by Dr. Randel Pigg. He states that he was told that there are some concerning factors on his MRI and that he may want to get with his cardiologist. He would like for Dr. Sallyanne Kuster to take a look at the MRI and give him his opinion and the findings. Please call to discuss.

## 2018-03-20 NOTE — Telephone Encounter (Signed)
Pt is calling back checking on the status of dr blyth recommendations from MRI

## 2018-03-20 NOTE — Telephone Encounter (Signed)
Spoke with pt who states he had a MRI done on 9/21 and was asked if he had ever had a mini stroke. Pt states that he was not aware of one but would like for Dr. Loletha Grayer to review the MRI and provide any recommendations that he may have. Routing to MD

## 2018-03-21 NOTE — Telephone Encounter (Signed)
Pt updated and verbalized appreciation for Dr. Sallyanne Kuster responding and providing his input.

## 2018-03-21 NOTE — Telephone Encounter (Signed)
Raquel Sarna can you call this patient for me  Thanks   Called patent and left message for patient to call the office back

## 2018-03-22 NOTE — Telephone Encounter (Signed)
Thank you :)

## 2018-03-22 NOTE — Telephone Encounter (Signed)
Author phoned pt. to relay Dr. Frederik Pear message.  Pt. stated he would like to go ahead with the referral. Referral placed, pt. very appreciative of Dr. Frederik Pear attention to this. S/S stroke reviewed with pt., pt. aware to go to ED if he experiences stroke sx.

## 2018-03-23 ENCOUNTER — Encounter: Payer: Self-pay | Admitting: Neurology

## 2018-03-27 DIAGNOSIS — E876 Hypokalemia: Secondary | ICD-10-CM | POA: Diagnosis not present

## 2018-03-27 DIAGNOSIS — M109 Gout, unspecified: Secondary | ICD-10-CM | POA: Diagnosis not present

## 2018-03-27 DIAGNOSIS — I129 Hypertensive chronic kidney disease with stage 1 through stage 4 chronic kidney disease, or unspecified chronic kidney disease: Secondary | ICD-10-CM | POA: Diagnosis not present

## 2018-03-27 DIAGNOSIS — N183 Chronic kidney disease, stage 3 (moderate): Secondary | ICD-10-CM | POA: Diagnosis not present

## 2018-03-27 DIAGNOSIS — E1122 Type 2 diabetes mellitus with diabetic chronic kidney disease: Secondary | ICD-10-CM | POA: Diagnosis not present

## 2018-03-30 ENCOUNTER — Other Ambulatory Visit: Payer: Self-pay | Admitting: Nephrology

## 2018-03-30 DIAGNOSIS — N183 Chronic kidney disease, stage 3 unspecified: Secondary | ICD-10-CM

## 2018-04-04 ENCOUNTER — Other Ambulatory Visit: Payer: Self-pay | Admitting: Family Medicine

## 2018-04-04 ENCOUNTER — Other Ambulatory Visit: Payer: Self-pay | Admitting: Cardiology

## 2018-04-04 ENCOUNTER — Other Ambulatory Visit: Payer: Self-pay | Admitting: Cardiovascular Disease

## 2018-04-04 DIAGNOSIS — E1169 Type 2 diabetes mellitus with other specified complication: Secondary | ICD-10-CM

## 2018-04-04 DIAGNOSIS — E669 Obesity, unspecified: Principal | ICD-10-CM

## 2018-04-05 ENCOUNTER — Telehealth: Payer: Self-pay

## 2018-04-05 NOTE — Telephone Encounter (Signed)
PA approved.  PA Case: [id number], Status: Approved, Coverage Starts on: 06/05/2017, Coverage Ends on: 06/06/2018. Questions? Contact [phone].

## 2018-04-05 NOTE — Telephone Encounter (Signed)
PA initiated via Covermymeds; KEY: [id number]. Awaiting determination.

## 2018-04-09 ENCOUNTER — Other Ambulatory Visit: Payer: Self-pay | Admitting: Cardiovascular Disease

## 2018-04-26 ENCOUNTER — Ambulatory Visit (INDEPENDENT_AMBULATORY_CARE_PROVIDER_SITE_OTHER): Payer: Medicare Other | Admitting: Pharmacist Clinician (PhC)/ Clinical Pharmacy Specialist

## 2018-04-26 DIAGNOSIS — I480 Paroxysmal atrial fibrillation: Secondary | ICD-10-CM

## 2018-04-26 DIAGNOSIS — Z7901 Long term (current) use of anticoagulants: Secondary | ICD-10-CM | POA: Diagnosis not present

## 2018-04-26 LAB — POCT INR: INR: 3.3 — AB (ref 2.0–3.0)

## 2018-04-26 NOTE — Patient Instructions (Signed)
Description   No wafarin today Wednesday Nov 20, then continue 1/2 tablet daily except 1 tablet each Monday and Friday. Next INR in 6 weeks

## 2018-04-27 ENCOUNTER — Ambulatory Visit: Payer: Medicare Other | Admitting: Family Medicine

## 2018-04-28 ENCOUNTER — Ambulatory Visit
Admission: RE | Admit: 2018-04-28 | Discharge: 2018-04-28 | Disposition: A | Discharge status: Home / Self Care | Payer: Medicare Other | Source: Ambulatory Visit | Attending: Nephrology | Admitting: Nephrology

## 2018-04-28 DIAGNOSIS — N183 Chronic kidney disease, stage 3 unspecified: Secondary | ICD-10-CM

## 2018-04-28 DIAGNOSIS — N281 Cyst of kidney, acquired: Secondary | ICD-10-CM | POA: Insufficient documentation

## 2018-05-02 ENCOUNTER — Ambulatory Visit (INDEPENDENT_AMBULATORY_CARE_PROVIDER_SITE_OTHER): Payer: Medicare Other | Admitting: Family Medicine

## 2018-05-02 ENCOUNTER — Encounter: Payer: Self-pay | Admitting: Family Medicine

## 2018-05-02 VITALS — BP 100/50 | HR 64 | Temp 97.4°F | Resp 18 | Ht 71.0 in | Wt 210.0 lb

## 2018-05-02 DIAGNOSIS — E1169 Type 2 diabetes mellitus with other specified complication: Secondary | ICD-10-CM

## 2018-05-02 DIAGNOSIS — G4700 Insomnia, unspecified: Secondary | ICD-10-CM | POA: Diagnosis not present

## 2018-05-02 DIAGNOSIS — M1A9XX0 Chronic gout, unspecified, without tophus (tophi): Secondary | ICD-10-CM

## 2018-05-02 DIAGNOSIS — I10 Essential (primary) hypertension: Secondary | ICD-10-CM

## 2018-05-02 DIAGNOSIS — Z79899 Other long term (current) drug therapy: Secondary | ICD-10-CM

## 2018-05-02 DIAGNOSIS — E782 Mixed hyperlipidemia: Secondary | ICD-10-CM

## 2018-05-02 DIAGNOSIS — N400 Enlarged prostate without lower urinary tract symptoms: Secondary | ICD-10-CM | POA: Diagnosis not present

## 2018-05-02 DIAGNOSIS — E669 Obesity, unspecified: Secondary | ICD-10-CM

## 2018-05-02 DIAGNOSIS — Z0000 Encounter for general adult medical examination without abnormal findings: Secondary | ICD-10-CM | POA: Diagnosis not present

## 2018-05-02 MED ORDER — LORAZEPAM 1 MG PO TABS: 1.0000 mg | ORAL_TABLET | Freq: Three times a day (TID) | ORAL | 3 refills | Status: DC | PRN

## 2018-05-02 NOTE — Assessment & Plan Note (Signed)
Hydrate and monitor uric acid 

## 2018-05-02 NOTE — Assessment & Plan Note (Signed)
hgba1c acceptable, minimize simple carbs. Increase exercise as tolerated. Continue current meds 

## 2018-05-02 NOTE — Patient Instructions (Addendum)
Shingrix is the new shingles shot, 2 shots over 2-6 months at pharmacy, pneumonia shots   Come back for nursevisit in next couple of weeks for flu shot. Call to help selecta a time. Preventive Care 40-64 Years, Male Preventive care refers to lifestyle choices and visits with your health care provider that can promote health and wellness. What does preventive care include?  A yearly physical exam. This is also called an annual well check.  Dental exams once or twice a year.  Routine eye exams. Ask your health care provider how often you should have your eyes checked.  Personal lifestyle choices, including: ? Daily care of your teeth and gums. ? Regular physical activity. ? Eating a healthy diet. ? Avoiding tobacco and drug use. ? Limiting alcohol use. ? Practicing safe sex. ? Taking low-dose aspirin every day starting at age 84. What happens during an annual well check? The services and screenings done by your health care provider during your annual well check will depend on your age, overall health, lifestyle risk factors, and family history of disease. Counseling Your health care provider may ask you questions about your:  Alcohol use.  Tobacco use.  Drug use.  Emotional well-being.  Home and relationship well-being.  Sexual activity.  Eating habits.  Work and work Statistician.  Screening You may have the following tests or measurements:  Height, weight, and BMI.  Blood pressure.  Lipid and cholesterol levels. These may be checked every 5 years, or more frequently if you are over 100 years old.  Skin check.  Lung cancer screening. You may have this screening every year starting at age 50 if you have a 30-pack-year history of smoking and currently smoke or have quit within the past 15 years.  Fecal occult blood test (FOBT) of the stool. You may have this test every year starting at age 92.  Flexible sigmoidoscopy or colonoscopy. You may have a sigmoidoscopy  every 5 years or a colonoscopy every 10 years starting at age 15.  Prostate cancer screening. Recommendations will vary depending on your family history and other risks.  Hepatitis C blood test.  Hepatitis B blood test.  Sexually transmitted disease (STD) testing.  Diabetes screening. This is done by checking your blood sugar (glucose) after you have not eaten for a while (fasting). You may have this done every 1-3 years.  Discuss your test results, treatment options, and if necessary, the need for more tests with your health care provider. Vaccines Your health care provider may recommend certain vaccines, such as:  Influenza vaccine. This is recommended every year.  Tetanus, diphtheria, and acellular pertussis (Tdap, Td) vaccine. You may need a Td booster every 10 years.  Varicella vaccine. You may need this if you have not been vaccinated.  Zoster vaccine. You may need this after age 19.  Measles, mumps, and rubella (MMR) vaccine. You may need at least one dose of MMR if you were born in 1957 or later. You may also need a second dose.  Pneumococcal 13-valent conjugate (PCV13) vaccine. You may need this if you have certain conditions and have not been vaccinated.  Pneumococcal polysaccharide (PPSV23) vaccine. You may need one or two doses if you smoke cigarettes or if you have certain conditions.  Meningococcal vaccine. You may need this if you have certain conditions.  Hepatitis A vaccine. You may need this if you have certain conditions or if you travel or work in places where you may be exposed to hepatitis A.  Hepatitis B vaccine. You may need this if you have certain conditions or if you travel or work in places where you may be exposed to hepatitis B.  Haemophilus influenzae type b (Hib) vaccine. You may need this if you have certain risk factors.  Talk to your health care provider about which screenings and vaccines you need and how often you need them. This information  is not intended to replace advice given to you by your health care provider. Make sure you discuss any questions you have with your health care provider. Document Released: 06/20/2015 Document Revised: 02/11/2016 Document Reviewed: 03/25/2015 Elsevier Interactive Patient Education  2018 [place] Years, Male Preventive care refers to lifestyle choices and visits with your health care provider that can promote health and wellness. What does preventive care include?  A yearly physical exam. This is also called an annual well check.  Dental exams once or twice a year.  Routine eye exams. Ask your health care provider how often you should have your eyes checked.  Personal lifestyle choices, including: ? Daily care of your teeth and gums. ? Regular physical activity. ? Eating a healthy diet. ? Avoiding tobacco and drug use. ? Limiting alcohol use. ? Practicing safe sex. ? Taking low-dose aspirin every day starting at age 22. What happens during an annual well check? The services and screenings done by your health care provider during your annual well check will depend on your age, overall health, lifestyle risk factors, and family history of disease. Counseling Your health care provider may ask you questions about your:  Alcohol use.  Tobacco use.  Drug use.  Emotional well-being.  Home and relationship well-being.  Sexual activity.  Eating habits.  Work and work Statistician.  Screening You may have the following tests or measurements:  Height, weight, and BMI.  Blood pressure.  Lipid and cholesterol levels. These may be checked every 5 years, or more frequently if you are over 65 years old.  Skin check.  Lung cancer screening. You may have this screening every year starting at age 10 if you have a 30-pack-year history of smoking and currently smoke or have quit within the past 15 years.  Fecal occult blood test (FOBT) of  the stool. You may have this test every year starting at age 14.  Flexible sigmoidoscopy or colonoscopy. You may have a sigmoidoscopy every 5 years or a colonoscopy every 10 years starting at age 49.  Prostate cancer screening. Recommendations will vary depending on your family history and other risks.  Hepatitis C blood test.  Hepatitis B blood test.  Sexually transmitted disease (STD) testing.  Diabetes screening. This is done by checking your blood sugar (glucose) after you have not eaten for a while (fasting). You may have this done every 1-3 years.  Discuss your test results, treatment options, and if necessary, the need for more tests with your health care provider. Vaccines Your health care provider may recommend certain vaccines, such as:  Influenza vaccine. This is recommended every year.  Tetanus, diphtheria, and acellular pertussis (Tdap, Td) vaccine. You may need a Td booster every 10 years.  Varicella vaccine. You may need this if you have not been vaccinated.  Zoster vaccine. You may need this after age 64.  Measles, mumps, and rubella (MMR) vaccine. You may need at least one dose of MMR if you were born in 1957 or later. You may also need a second dose.  Pneumococcal 13-valent  conjugate (PCV13) vaccine. You may need this if you have certain conditions and have not been vaccinated.  Pneumococcal polysaccharide (PPSV23) vaccine. You may need one or two doses if you smoke cigarettes or if you have certain conditions.  Meningococcal vaccine. You may need this if you have certain conditions.  Hepatitis A vaccine. You may need this if you have certain conditions or if you travel or work in places where you may be exposed to hepatitis A.  Hepatitis B vaccine. You may need this if you have certain conditions or if you travel or work in places where you may be exposed to hepatitis B.  Haemophilus influenzae type b (Hib) vaccine. You may need this if you have certain risk  factors.  Talk to your health care provider about which screenings and vaccines you need and how often you need them. This information is not intended to replace advice given to you by your health care provider. Make sure you discuss any questions you have with your health care provider. Document Released: 06/20/2015 Document Revised: 02/11/2016 Document Reviewed: 03/25/2015 Elsevier Interactive Patient Education  [employer].

## 2018-05-03 LAB — HEMOGLOBIN A1C: Hgb A1c MFr Bld: 7 % — ABNORMAL HIGH (ref 4.6–6.5)

## 2018-05-03 LAB — LIPID PANEL
CHOL/HDL RATIO: 4
Cholesterol: 134 mg/dL (ref 0–200)
HDL: 31 mg/dL — AB (ref >39.00)
NONHDL: 102.77
Triglycerides: 204 mg/dL — ABNORMAL HIGH (ref 0.0–149.0)
VLDL: 40.8 mg/dL — ABNORMAL HIGH (ref 0.0–40.0)

## 2018-05-03 LAB — COMPREHENSIVE METABOLIC PANEL
ALT: 13 U/L (ref 0–53)
AST: 15 U/L (ref 0–37)
Albumin: 4.6 g/dL (ref 3.5–5.2)
Alkaline Phosphatase: 42 U/L (ref 39–117)
BUN: 54 mg/dL — AB (ref 6–23)
CHLORIDE: 104 meq/L (ref 96–112)
CO2: 27 mEq/L (ref 19–32)
CREATININE: 2.11 mg/dL — AB (ref 0.40–1.50)
Calcium: 10 mg/dL (ref 8.4–10.5)
GFR: 40.7 mL/min — ABNORMAL LOW (ref >60.00)
Glucose, Bld: 72 mg/dL (ref 70–99)
Potassium: 4.5 mEq/L (ref 3.5–5.1)
SODIUM: 140 meq/L (ref 135–145)
Total Bilirubin: 0.5 mg/dL (ref 0.2–1.2)
Total Protein: 7.5 g/dL (ref 6.0–8.3)

## 2018-05-03 LAB — PSA: PSA: 10.21 ng/mL — ABNORMAL HIGH (ref 0.10–4.00)

## 2018-05-03 LAB — TSH: TSH: 1.29 u[IU]/mL (ref 0.35–4.50)

## 2018-05-03 LAB — CBC
HCT: 46.5 % (ref 39.0–52.0)
Hemoglobin: 14.7 g/dL (ref 13.0–17.0)
MCHC: 31.7 g/dL (ref 30.0–36.0)
MCV: 80.5 fl (ref 78.0–100.0)
Platelets: 190 10*3/uL (ref 150.0–400.0)
RBC: 5.78 Mil/uL (ref 4.22–5.81)
RDW: 18 % — ABNORMAL HIGH (ref 11.5–15.5)
WBC: 3.7 10*3/uL — ABNORMAL LOW (ref 4.0–10.5)

## 2018-05-03 LAB — LDL CHOLESTEROL, DIRECT: LDL DIRECT: 84 mg/dL

## 2018-05-03 LAB — URIC ACID: URIC ACID, SERUM: 5 mg/dL (ref 4.0–7.8)

## 2018-05-04 LAB — PAIN MGMT, PROFILE 8 W/CONF, U
6 Acetylmorphine: NEGATIVE ng/mL (ref <10)
ALPHAHYDROXYTRIAZOLAM: NEGATIVE ng/mL (ref <50)
AMINOCLONAZEPAM: NEGATIVE ng/mL (ref <25)
Alcohol Metabolites: NEGATIVE ng/mL (ref <500)
Alphahydroxyalprazolam: NEGATIVE ng/mL (ref <25)
Alphahydroxymidazolam: NEGATIVE ng/mL (ref <50)
Amphetamines: NEGATIVE ng/mL (ref <500)
BUPRENORPHINE, URINE: NEGATIVE ng/mL (ref <5)
Benzodiazepines: POSITIVE ng/mL — AB (ref <100)
Cocaine Metabolite: NEGATIVE ng/mL (ref <150)
Creatinine: 146.1 mg/dL
Hydroxyethylflurazepam: NEGATIVE ng/mL (ref <50)
Lorazepam: 690 ng/mL — ABNORMAL HIGH (ref <50)
MDMA: NEGATIVE ng/mL (ref <500)
Marijuana Metabolite: NEGATIVE ng/mL (ref <20)
Nordiazepam: NEGATIVE ng/mL (ref <50)
OXAZEPAM: 626 ng/mL — AB (ref <50)
OXYCODONE: NEGATIVE ng/mL (ref <100)
Opiates: NEGATIVE ng/mL (ref <100)
Oxidant: NEGATIVE ug/mL (ref <200)
TEMAZEPAM: 8001 ng/mL — AB (ref <50)
pH: 5.25 (ref 4.5–9.0)

## 2018-05-08 NOTE — Assessment & Plan Note (Signed)
Patient denies any difficulties at home. No trouble with ADLs, depression or falls. See EMR for functional status screen and depression screen. No recent changes to vision or hearing. Is UTD with immunizations. Is UTD with screening. Discussed Advanced Directives. Encouraged heart healthy diet, exercise as tolerated and adequate sleep. See patient's problem list for health risk factors to monitor. See AVS for preventative healthcare recommendation schedule. 

## 2018-05-08 NOTE — Assessment & Plan Note (Signed)
Encouraged heart healthy diet, increase exercise, avoid trans fats, consider a krill oil cap daily 

## 2018-05-08 NOTE — Progress Notes (Signed)
Subjective:    Patient ID: [name], male    DOB: 1950-11-06, 67 y.o.   MRN: [id number]  No chief complaint on file.   HPI Patient is in today for follow up. No recent febrile illness or hospitalizations. No polyuria or polydipsia. Continues to struggle with insomnia but his current meds work as well as he has been able to find over the years. He notes fatigue and DOE that is stable. Denies CP/palp/HA/congestion/fevers/GI or GU c/o. Taking meds as prescribed  Past Medical History:  Diagnosis Date  . AORTIC STENOSIS, MODERATE 12/05/2009  . Atrial fibrillation ([place])   . Atrial fibrillation with RVR ([place]) 07/05/2017  . Atrial flutter ([place]) 12/05/2009  . Atypical chest pain 11/08/2011  . BENIGN PROSTATIC HYPERTROPHY, HX OF 12/05/2009  . CHEST PAIN-UNSPECIFIED 11/11/2009  . CHF 12/05/2009  . Chronic renal insufficiency 12/16/2016  . Debility 04/18/2012  . Diarrhea 12/16/2016  . DYSPNEA 12/19/2009  . Edema 04/18/2012  . ERECTILE DYSFUNCTION 01/25/2007  . FATIGUE, CHRONIC 11/11/2009  . Gout 07/10/2012  . Heart murmur   . History of kidney stones    "they passed" (07/05/2017)  . Hyperkalemia 04/18/2012  . Hyperlipidemia   . Hypertension   . HYPERTENSION 01/25/2007  . Hypoxia 05/05/2011  . Insomnia 05/13/2016  . INSOMNIA, HX OF 01/25/2007  . Knee pain, bilateral 12/28/2010  . Knee pain, right 12/28/2010  . NEUROMA 01/05/2010  . OBESITY NOS 01/25/2007  . OSA on CPAP 04/06/2010  . PERIPHERAL NEUROPATHY 01/05/2010  . PULMONARY FUNCTION TESTS, ABNORMAL 02/02/2010  . Superficial thrombophlebitis of left leg 09/07/2011  . TESTICULAR HYPOFUNCTION 01/05/2010  . 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 Toe pain, right 07/10/2012  . Type II diabetes mellitus ([place]) 12/05/2009  . UNSPECIFIED ANEMIA 01/05/2010    Past Surgical History:  Procedure Laterality Date  . CARDIAC CATHETERIZATION  10/16/2009   nonischemic cardiomyopathy  . CARDIOVERSION N/A 03/25/2015   Procedure: CARDIOVERSION;  Surgeon: Pixie Casino, MD;   Location: [facility] ENDOSCOPY;  Service: Cardiovascular;  Laterality: N/A;  . CARDIOVERSION N/A 06/17/2017   Procedure: CARDIOVERSION;  Surgeon: Sanda Klein, MD;  Location: [place] ENDOSCOPY;  Service: Cardiovascular;  Laterality: N/A;  . CARDIOVERSION N/A 07/08/2017   Procedure: CARDIOVERSION;  Surgeon: Fay Records, MD;  Location: [facility] ENDOSCOPY;  Service: Cardiovascular;  Laterality: N/A;  . METATARSAL OSTEOTOMY Bilateral ~ 1980   removed part of 5th metatarsal to corect curvature of toes     Family History  Problem Relation Age of Onset  . Clotting disorder Mother   . Heart disease Mother        s/p MI  . Heart attack Mother   . Hypertension Mother   . Diabetes Mother   . Hyperlipidemia Mother   . Stroke Mother   . Cancer Father        ? lung  . Lung disease Father        smoker  . Diabetes Sister   . Hypertension Sister        smoker  . Leukemia Maternal Grandmother        ?  [name] Aneurysm Sister        brain  . Other Sister        clipped  . Seizures Sister        d/o w/aneurysm/ smoker    Social History   Socioeconomic History  . Marital status: Married    Spouse name: Not on file  . Number of children: Not on file  .  Years of education: Not on file  . Highest education level: Not on file  Occupational History  . Not on file  Social Needs  . Financial resource strain: Not on file  . Food insecurity:    Worry: Not on file    Inability: Not on file  . Transportation needs:    Medical: Not on file    Non-medical: Not on file  Tobacco Use  . Smoking status: Former Smoker    Packs/day: 1.00    Years: 10.00    Pack years: 10.00    Last attempt to quit: 06/07/1980    Years since quitting: 37.9  . Smokeless tobacco: Never Used  Substance and Sexual Activity  . Alcohol use: No  . Drug use: No  . Sexual activity: Not Currently  Lifestyle  . Physical activity:    Days per week: Not on file    Minutes per session: Not on file  . Stress: Not on file  Relationships  .  Social connections:    Talks on phone: Not on file    Gets together: Not on file    Attends religious service: Not on file    Active member of club or organization: Not on file    Attends meetings of clubs or organizations: Not on file    Relationship status: Not on file  . Intimate partner violence:    Fear of current or ex partner: Not on file    Emotionally abused: Not on file    Physically abused: Not on file    Forced sexual activity: Not on file  Other Topics Concern  . Not on file  Social History Narrative  . Not on file    Outpatient Medications Prior to Visit  Medication Sig Dispense Refill  . albuterol (VENTOLIN HFA) 108 (90 Base) MCG/ACT inhaler Inhale 2 puffs into the lungs every 6 (six) hours as needed for wheezing or shortness of breath. 1 Inhaler 6  . allopurinol (ZYLOPRIM) 300 MG tablet TAKE 1 TABLET BY MOUTH ONCE DAILY 90 tablet 2  . benazepril (LOTENSIN) 10 MG tablet Take 0.5 tablets (5 mg total) by mouth daily.  3  . calcium carbonate (TUMS - DOSED IN MG ELEMENTAL CALCIUM) 500 MG chewable tablet Chew 1 tablet by mouth daily as needed. For heartburn    . cetirizine (ZYRTEC) 10 MG tablet Take 1 tablet (10 mg total) by mouth daily. 30 tablet 11  . Clobetasol Prop Emollient Base (CLOBETASOL PROPIONATE E) 0.05 % emollient cream Apply 1 application topically 2 (two) times daily as needed (insect bite). 60 g 1  . clotrimazole-betamethasone (LOTRISONE) cream Apply 1 application topically 2 (two) times daily. 30 g 1  . colchicine (COLCRYS) 0.6 MG tablet TAKE ONE TABLET BY MOUTH DAILY AS NEEDED FOR GOUT FLARE UPS. 30 tablet 0  . dofetilide (TIKOSYN) 500 MCG capsule TAKE 1 CAPSULE(500 MCG) BY MOUTH TWICE DAILY 180 capsule 2  . fish oil-omega-3 fatty acids 1000 MG capsule Take 1 g by mouth daily.     . furosemide (LASIX) 40 MG tablet TAKE TWO TABLETS BY MOUTH EVERY MORNING AND TAKE ONE TABLET BY MOUTH EVERY AFTERNOON 270 tablet 3  . glyBURIDE (DIABETA) 5 MG tablet TAKE 1 TABLET  BY MOUTH DAILY WITH BREAKFAST 90 tablet 0  . HYDROcodone-acetaminophen (NORCO) 10-325 MG tablet Take 1 tablet by mouth every 8 (eight) hours as needed for moderate pain or severe pain. 90 tablet 0  . KLOR-CON M20 20 MEQ tablet TAKE 1 TABLET BY  MOUTH ONCE DAILY AND  EXTRA  TABLET  IF  WEIGHT  IS  ABOVE  210  LBS 180 tablet 2  . magnesium oxide (MAG-OX) 400 MG tablet Take 1 tablet (400 mg total) by mouth daily. 30 tablet 11  . metFORMIN (GLUCOPHAGE) 500 MG tablet TAKE 1 TABLET BY MOUTH THREE TIMES DAILY 270 tablet 0  . metoprolol succinate (TOPROL-XL) 100 MG 24 hr tablet TAKE 1 TABLET BY MOUTH TWICE DAILY TAKE  WITH  OR  IMMEDIATELY  FOLLOWING  A  MEAL 180 tablet 0  . Multiple Vitamins-Minerals (MULTIVITAMIN PO) Take 1 tablet by mouth daily.    . pravastatin (PRAVACHOL) 40 MG tablet TAKE 1 TABLET BY MOUTH ONCE DAILY 90 tablet 3  . ranolazine (RANEXA) 500 MG 12 hr tablet Take 1 tablet (500 mg total) by mouth 2 (two) times daily. 60 tablet 5  . temazepam (RESTORIL) 30 MG capsule TAKE 1 CAPSULE BY MOUTH AT BEDTIME AS NEEDED FOR SLEEP 30 capsule 5  . Testosterone 10 MG/ACT (2%) GEL Apply 1 application topically daily. Use as directed    . warfarin (COUMADIN) 4 MG tablet TAKE 1/2 TO 1 (ONE-HALF TO ONE) TABLET BY MOUTH DAILY OR AS DIRECTED BY COUMADIN CLINIC 90 tablet 1  . LORazepam (ATIVAN) 1 MG tablet Take 1 tablet (1 mg total) by mouth every 8 (eight) hours as needed for anxiety. (Patient taking differently: Take 0.5 mg by mouth every 8 (eight) hours as needed for anxiety. ) 30 tablet 3   No facility-administered medications prior to visit.     Allergies  Allergen Reactions  . Sulfonamide Derivatives Other (See Comments)    unknown reaction.  Patient states had to go to the ER.   [name] Zolpidem Other (See Comments)    Excessive, prolonged sedation    Review of Systems  Constitutional: Positive for malaise/fatigue. Negative for chills and fever.  HENT: Negative for congestion and hearing loss.     Eyes: Negative for discharge.  Respiratory: Positive for shortness of breath. Negative for cough and sputum production.   Cardiovascular: Negative for chest pain, palpitations and leg swelling.  Gastrointestinal: Negative for abdominal pain, blood in stool, constipation, diarrhea, heartburn, nausea and vomiting.  Genitourinary: Negative for dysuria, frequency, hematuria and urgency.  Musculoskeletal: Negative for back pain, falls and myalgias.  Skin: Negative for rash.  Neurological: Negative for dizziness, sensory change, loss of consciousness, weakness and headaches.  Endo/Heme/Allergies: Negative for environmental allergies. Does not bruise/bleed easily.  Psychiatric/Behavioral: Negative for depression and suicidal ideas. The patient is not nervous/anxious and does not have insomnia.        Objective:    Physical Exam  Constitutional: He is oriented to person, place, and time. He appears well-developed and well-nourished. No distress.  HENT:  Head: Normocephalic and atraumatic.  Nose: Nose normal.  Eyes: Right eye exhibits no discharge. Left eye exhibits no discharge.  Neck: Normal range of motion. Neck supple.  Cardiovascular: Normal rate and regular rhythm.  Murmur heard. Pulmonary/Chest: Effort normal and breath sounds normal.  Abdominal: Soft. Bowel sounds are normal. There is no tenderness.  Musculoskeletal: He exhibits no edema.  Neurological: He is alert and oriented to person, place, and time.  Skin: Skin is warm and dry.  Psychiatric: He has a normal mood and affect.  Nursing note and vitals reviewed.   BP (!) 100/50 (BP Location: Left Arm, Patient Position: Sitting, Cuff Size: Normal)   Pulse 64   Temp (!) 97.4 F (36.3 C) (Oral)  Resp 18   Ht 5\' 11"  (1.803 m)   Wt 210 lb (95.3 kg)   SpO2 93%   BMI 29.29 kg/m  Wt Readings from Last 3 Encounters:  05/02/18 210 lb (95.3 kg)  01/24/18 212 lb 6.4 oz (96.3 kg)  01/23/18 211 lb 9.6 oz (96 kg)     Lab  Results  Component Value Date   WBC 3.7 (L) 05/02/2018   HGB 14.7 05/02/2018   HCT 46.5 05/02/2018   PLT 190.0 05/02/2018   GLUCOSE 72 05/02/2018   CHOL 134 05/02/2018   TRIG 204.0 (H) 05/02/2018   HDL 31.00 (L) 05/02/2018   LDLDIRECT 84.0 05/02/2018   LDLCALC 83 01/24/2018   ALT 13 05/02/2018   AST 15 05/02/2018   NA 140 05/02/2018   K 4.5 05/02/2018   CL 104 05/02/2018   CREATININE 2.11 (H) 05/02/2018   BUN 54 (H) 05/02/2018   CO2 27 05/02/2018   TSH 1.29 05/02/2018   PSA 10.21 (H) 05/02/2018   INR 3.3 (A) 04/26/2018   HGBA1C 7.0 (H) 05/02/2018   MICROALBUR 1.1 04/14/2015    Lab Results  Component Value Date   TSH 1.29 05/02/2018   Lab Results  Component Value Date   WBC 3.7 (L) 05/02/2018   HGB 14.7 05/02/2018   HCT 46.5 05/02/2018   MCV 80.5 05/02/2018   PLT 190.0 05/02/2018   Lab Results  Component Value Date   NA 140 05/02/2018   K 4.5 05/02/2018   CO2 27 05/02/2018   GLUCOSE 72 05/02/2018   BUN 54 (H) 05/02/2018   CREATININE 2.11 (H) 05/02/2018   BILITOT 0.5 05/02/2018   ALKPHOS 42 05/02/2018   AST 15 05/02/2018   ALT 13 05/02/2018   PROT 7.5 05/02/2018   ALBUMIN 4.6 05/02/2018   CALCIUM 10.0 05/02/2018   ANIONGAP 11 07/08/2017   GFR 40.70 (L) 05/02/2018   Lab Results  Component Value Date   CHOL 134 05/02/2018   Lab Results  Component Value Date   HDL 31.00 (L) 05/02/2018   Lab Results  Component Value Date   LDLCALC 83 01/24/2018   Lab Results  Component Value Date   TRIG 204.0 (H) 05/02/2018   Lab Results  Component Value Date   CHOLHDL 4 05/02/2018   Lab Results  Component Value Date   HGBA1C 7.0 (H) 05/02/2018       Assessment & Plan:   Problem List Items Addressed This Visit    Diabetes mellitus type 2 in obese ([place])    hgba1c acceptable, minimize simple carbs. Increase exercise as tolerated. Continue current meds      Relevant Orders   Hemoglobin A1c (Completed)   BPH (benign prostatic hyperplasia)   Relevant  Orders   PSA (Completed)   Hyperlipidemia    Encouraged heart healthy diet, increase exercise, avoid trans fats, consider a krill oil cap daily      Relevant Orders   Lipid panel (Completed)   Gout    Hydrate and monitor uric acid      Relevant Orders   Uric acid (Completed)   Medicare annual wellness visit, subsequent    Patient denies any difficulties at home. No trouble with ADLs, depression or falls. See EMR for functional status screen and depression screen. No recent changes to vision or hearing. Is UTD with immunizations. Is UTD with screening. Discussed Advanced Directives. Encouraged heart healthy diet, exercise as tolerated and adequate sleep. See patient's problem list for health risk factors to monitor. See AVS  for preventative healthcare recommendation schedule.      Essential hypertension, malignant    Well controlled, no changes to meds. Encouraged heart healthy diet such as the DASH diet and exercise as tolerated.       Relevant Orders   CBC (Completed)   Comprehensive metabolic panel (Completed)   TSH (Completed)   Comprehensive metabolic panel   Insomnia    Encouraged good sleep hygiene such as dark, quiet room. No blue/green glowing lights such as computer screens in bedroom. No alcohol or stimulants in evening. Cut down on caffeine as able. Regular exercise is helpful but not just prior to bed time. Continues to struggle but notes the current meds are the best he has found. No changes.        Other Visit Diagnoses    High risk medication use    -  Primary   Relevant Orders   Pain Mgmt, Profile 8 w/Conf, U (Completed)      I am having [name] maintain his fish oil-omega-3 fatty acids, calcium carbonate, Multiple Vitamins-Minerals (MULTIVITAMIN PO), Testosterone, cetirizine, magnesium oxide, colchicine, albuterol, Clobetasol Prop Emollient Base, furosemide, ranolazine, pravastatin, KLOR-CON M20, allopurinol, HYDROcodone-acetaminophen,  clotrimazole-betamethasone, temazepam, benazepril, glyBURIDE, metFORMIN, metoprolol succinate, warfarin, dofetilide, and LORazepam.  Meds ordered this encounter  Medications  . LORazepam (ATIVAN) 1 MG tablet    Sig: Take 1 tablet (1 mg total) by mouth every 8 (eight) hours as needed for anxiety.    Dispense:  30 tablet    Refill:  3     Penni Homans, MD

## 2018-05-08 NOTE — Assessment & Plan Note (Signed)
Well controlled, no changes to meds. Encouraged heart healthy diet such as the DASH diet and exercise as tolerated.  °

## 2018-05-08 NOTE — Assessment & Plan Note (Signed)
Encouraged good sleep hygiene such as dark, quiet room. No blue/green glowing lights such as computer screens in bedroom. No alcohol or stimulants in evening. Cut down on caffeine as able. Regular exercise is helpful but not just prior to bed time. Continues to struggle but notes the current meds are the best he has found. No changes.

## 2018-05-22 DIAGNOSIS — M109 Gout, unspecified: Secondary | ICD-10-CM | POA: Diagnosis not present

## 2018-05-22 DIAGNOSIS — E876 Hypokalemia: Secondary | ICD-10-CM | POA: Diagnosis not present

## 2018-05-22 DIAGNOSIS — I129 Hypertensive chronic kidney disease with stage 1 through stage 4 chronic kidney disease, or unspecified chronic kidney disease: Secondary | ICD-10-CM | POA: Diagnosis not present

## 2018-05-22 DIAGNOSIS — N183 Chronic kidney disease, stage 3 (moderate): Secondary | ICD-10-CM | POA: Diagnosis not present

## 2018-05-22 DIAGNOSIS — E1122 Type 2 diabetes mellitus with diabetic chronic kidney disease: Secondary | ICD-10-CM | POA: Diagnosis not present

## 2018-05-22 DIAGNOSIS — R8290 Unspecified abnormal findings in urine: Secondary | ICD-10-CM | POA: Diagnosis not present

## 2018-05-23 DIAGNOSIS — Z23 Encounter for immunization: Secondary | ICD-10-CM | POA: Diagnosis not present

## 2018-05-23 DIAGNOSIS — L2084 Intrinsic (allergic) eczema: Secondary | ICD-10-CM | POA: Diagnosis not present

## 2018-05-26 ENCOUNTER — Other Ambulatory Visit: Payer: Self-pay | Admitting: Nephrology

## 2018-05-26 DIAGNOSIS — N183 Chronic kidney disease, stage 3 unspecified: Secondary | ICD-10-CM

## 2018-05-26 NOTE — Progress Notes (Signed)
NEUROLOGY FOLLOW UP OFFICE NOTE  [name] [id number]  HISTORY OF PRESENT ILLNESS: [name] is a 67 year old male with atrial fibrillation status post cardioversion on warfarin, type 2 diabetes mellitus, hypertension, hyperlipidemia, CHF, and chronic renal insufficiency who presents for evaluation of strokes on brain MRI.  History supplemented by referring provider's note.  He had an eye exam and was found to have some vision issues.  He had an MRI of the brain and orbits with and without contrast on 02/16/18 which was personally reviewed and demonstrated global atrophy and significant chronic small vessel ischemic changes with remote ischemic infarcts in the left frontal region and right basal ganglia as well as evidence of left ischemic optic atrophy.but no acute findings.  05/02/18 LABS:  LDL with t chol 134, TG 204, HDL 31, direct LDL 84; Hgb A1c 7. 09/14/17 Limited Transthoracic Echocardiogram:  LV EF 42%.  Complete TTE from 12/31/15 demonstrated no atrial septal defect or PFO.  PAST MEDICAL HISTORY: Past Medical History:  Diagnosis Date  . AORTIC STENOSIS, MODERATE 12/05/2009  . Atrial fibrillation ([place])   . Atrial fibrillation with RVR ([place]) 07/05/2017  . Atrial flutter ([place]) 12/05/2009  . Atypical chest pain 11/08/2011  . BENIGN PROSTATIC HYPERTROPHY, HX OF 12/05/2009  . CHEST PAIN-UNSPECIFIED 11/11/2009  . CHF 12/05/2009  . Chronic renal insufficiency 12/16/2016  . Debility 04/18/2012  . Diarrhea 12/16/2016  . DYSPNEA 12/19/2009  . Edema 04/18/2012  . ERECTILE DYSFUNCTION 01/25/2007  . FATIGUE, CHRONIC 11/11/2009  . Gout 07/10/2012  . Heart murmur   . History of kidney stones    "they passed" (07/05/2017)  . Hyperkalemia 04/18/2012  . Hyperlipidemia   . Hypertension   . HYPERTENSION 01/25/2007  . Hypoxia 05/05/2011  . Insomnia 05/13/2016  . INSOMNIA, HX OF 01/25/2007  . Knee pain, bilateral 12/28/2010  . Knee pain, right 12/28/2010  . NEUROMA 01/05/2010  . OBESITY NOS  01/25/2007  . OSA on CPAP 04/06/2010  . PERIPHERAL NEUROPATHY 01/05/2010  . PULMONARY FUNCTION TESTS, ABNORMAL 02/02/2010  . Superficial thrombophlebitis of left leg 09/07/2011  . TESTICULAR HYPOFUNCTION 01/05/2010  . 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 Toe pain, right 07/10/2012  . Type II diabetes mellitus ([place]) 12/05/2009  . UNSPECIFIED ANEMIA 01/05/2010    MEDICATIONS: Current Outpatient Medications on File Prior to Visit  Medication Sig Dispense Refill  . albuterol (VENTOLIN HFA) 108 (90 Base) MCG/ACT inhaler Inhale 2 puffs into the lungs every 6 (six) hours as needed for wheezing or shortness of breath. 1 Inhaler 6  . allopurinol (ZYLOPRIM) 300 MG tablet TAKE 1 TABLET BY MOUTH ONCE DAILY 90 tablet 2  . benazepril (LOTENSIN) 10 MG tablet Take 0.5 tablets (5 mg total) by mouth daily.  3  . calcium carbonate (TUMS - DOSED IN MG ELEMENTAL CALCIUM) 500 MG chewable tablet Chew 1 tablet by mouth daily as needed. For heartburn    . cetirizine (ZYRTEC) 10 MG tablet Take 1 tablet (10 mg total) by mouth daily. 30 tablet 11  . Clobetasol Prop Emollient Base (CLOBETASOL PROPIONATE E) 0.05 % emollient cream Apply 1 application topically 2 (two) times daily as needed (insect bite). 60 g 1  . clotrimazole-betamethasone (LOTRISONE) cream Apply 1 application topically 2 (two) times daily. 30 g 1  . colchicine (COLCRYS) 0.6 MG tablet TAKE ONE TABLET BY MOUTH DAILY AS NEEDED FOR GOUT FLARE UPS. 30 tablet 0  . dofetilide (TIKOSYN) 500 MCG capsule TAKE 1 CAPSULE(500 MCG) BY MOUTH TWICE DAILY 180 capsule  2  . fish oil-omega-3 fatty acids 1000 MG capsule Take 1 g by mouth daily.     . furosemide (LASIX) 40 MG tablet TAKE TWO TABLETS BY MOUTH EVERY MORNING AND TAKE ONE TABLET BY MOUTH EVERY AFTERNOON 270 tablet 3  . glyBURIDE (DIABETA) 5 MG tablet TAKE 1 TABLET BY MOUTH DAILY WITH BREAKFAST 90 tablet 0  . HYDROcodone-acetaminophen (NORCO) 10-325 MG tablet Take 1 tablet by mouth every 8 (eight) hours as needed for moderate pain or  severe pain. 90 tablet 0  . KLOR-CON M20 20 MEQ tablet TAKE 1 TABLET BY MOUTH ONCE DAILY AND  EXTRA  TABLET  IF  WEIGHT  IS  ABOVE  210  LBS 180 tablet 2  . LORazepam (ATIVAN) 1 MG tablet Take 1 tablet (1 mg total) by mouth every 8 (eight) hours as needed for anxiety. 30 tablet 3  . magnesium oxide (MAG-OX) 400 MG tablet Take 1 tablet (400 mg total) by mouth daily. 30 tablet 11  . metFORMIN (GLUCOPHAGE) 500 MG tablet TAKE 1 TABLET BY MOUTH THREE TIMES DAILY 270 tablet 0  . metoprolol succinate (TOPROL-XL) 100 MG 24 hr tablet TAKE 1 TABLET BY MOUTH TWICE DAILY TAKE  WITH  OR  IMMEDIATELY  FOLLOWING  A  MEAL 180 tablet 0  . Multiple Vitamins-Minerals (MULTIVITAMIN PO) Take 1 tablet by mouth daily.    . pravastatin (PRAVACHOL) 40 MG tablet TAKE 1 TABLET BY MOUTH ONCE DAILY 90 tablet 3  . ranolazine (RANEXA) 500 MG 12 hr tablet Take 1 tablet (500 mg total) by mouth 2 (two) times daily. 60 tablet 5  . temazepam (RESTORIL) 30 MG capsule TAKE 1 CAPSULE BY MOUTH AT BEDTIME AS NEEDED FOR SLEEP 30 capsule 5  . Testosterone 10 MG/ACT (2%) GEL Apply 1 application topically daily. Use as directed    . warfarin (COUMADIN) 4 MG tablet TAKE 1/2 TO 1 (ONE-HALF TO ONE) TABLET BY MOUTH DAILY OR AS DIRECTED BY COUMADIN CLINIC 90 tablet 1   No current facility-administered medications on file prior to visit.     ALLERGIES: Allergies  Allergen Reactions  . Sulfonamide Derivatives Other (See Comments)    unknown reaction.  Patient states had to go to the ER.   [name] Zolpidem Other (See Comments)    Excessive, prolonged sedation    FAMILY HISTORY: Family History  Problem Relation Age of Onset  . Clotting disorder Mother   . Heart disease Mother        s/p MI  . Heart attack Mother   . Hypertension Mother   . Diabetes Mother   . Hyperlipidemia Mother   . Stroke Mother   . Cancer Father        ? lung  . Lung disease Father        smoker  . Diabetes Sister   . Hypertension Sister        smoker  .  Leukemia Maternal Grandmother        ?  [name] Aneurysm Sister        brain  . Other Sister        clipped  . Seizures Sister        d/o w/aneurysm/ smoker   SOCIAL HISTORY: Social History   Socioeconomic History  . Marital status: Married    Spouse name: Not on file  . Number of children: Not on file  . Years of education: Not on file  . Highest education level: Not on file  Occupational History  .  Not on file  Social Needs  . Financial resource strain: Not on file  . Food insecurity:    Worry: Not on file    Inability: Not on file  . Transportation needs:    Medical: Not on file    Non-medical: Not on file  Tobacco Use  . Smoking status: Former Smoker    Packs/day: 1.00    Years: 10.00    Pack years: 10.00    Last attempt to quit: 06/07/1980    Years since quitting: 37.9  . Smokeless tobacco: Never Used  Substance and Sexual Activity  . Alcohol use: No  . Drug use: No  . Sexual activity: Not Currently  Lifestyle  . Physical activity:    Days per week: Not on file    Minutes per session: Not on file  . Stress: Not on file  Relationships  . Social connections:    Talks on phone: Not on file    Gets together: Not on file    Attends religious service: Not on file    Active member of club or organization: Not on file    Attends meetings of clubs or organizations: Not on file    Relationship status: Not on file  . Intimate partner violence:    Fear of current or ex partner: Not on file    Emotionally abused: Not on file    Physically abused: Not on file    Forced sexual activity: Not on file  Other Topics Concern  . Not on file  Social History Narrative  . Not on file    REVIEW OF SYSTEMS: Constitutional: No fevers, chills, or sweats, no generalized fatigue, change in appetite Eyes: No visual changes, double vision, eye pain Ear, nose and throat: No hearing loss, ear pain, nasal congestion, sore throat Cardiovascular: No chest pain, palpitations Respiratory:   No shortness of breath at rest or with exertion, wheezes GastrointestinaI: No nausea, vomiting, diarrhea, abdominal pain, fecal incontinence Genitourinary:  No dysuria, urinary retention or frequency Musculoskeletal:  No neck pain, back pain Integumentary: No rash, pruritus, skin lesions Neurological: as above Psychiatric: No depression, insomnia, anxiety Endocrine: No palpitations, fatigue, diaphoresis, mood swings, change in appetite, change in weight, increased thirst Hematologic/Lymphatic:  No purpura, petechiae. Allergic/Immunologic: no itchy/runny eyes, nasal congestion, recent allergic reactions, rashes  PHYSICAL EXAM: Blood pressure 110/68, pulse 78, height 5\' 11"  (1.803 m), weight 209 lb 8 oz (95 kg), SpO2 96 %. General: No acute distress.  Patient appears well-groomed. Head:  Normocephalic/atraumatic Eyes:  Fundi examined but not visualized Neck: supple, no paraspinal tenderness, full range of motion Heart:  murmur Lungs:  Clear to auscultation bilaterally Back: No paraspinal tenderness Neurological Exam: alert and oriented to person, place, and time. Attention span and concentration intact, recent and remote memory intact, fund of knowledge intact.  Speech fluent and not dysarthric, language intact.  CN II-XII intact. Bulk and tone normal, muscle strength 5/5 throughout.  Sensation to light touch, temperature and vibration intact.  Deep tendon reflexes 1+ throughout, toes downgoing.  Finger to nose and heel to shin testing intact.  Gait normal, Romberg negative.  IMPRESSION: Cerebral vascular disease with chronic left MCA frontal infarct as noted on MRI of brain in patient with multiple stroke risk factors including type 2 diabetes, atrial fibrillation, hypertension, and hyperlipidemia.  The left frontal infarct may have been there for many years (since before he was treated for atrial fibrillation).  PLAN: Check carotid doppler.  Further recommendations pending results Optimize  management of stroke risk factors: 1.  On Coumadin for atrial fibrillation and stroke prevention 2.  Statin therapy for hyperlipidemia (LDL goal less than 70) 3.  Blood pressure control 4.  Glycemic control 5.  Exercise 6.  Mediterranean diet Follow up as needed.  Metta Clines, DO  CC: Penni Homans, MD

## 2018-05-29 ENCOUNTER — Encounter: Payer: Self-pay | Admitting: Neurology

## 2018-05-29 ENCOUNTER — Ambulatory Visit (INDEPENDENT_AMBULATORY_CARE_PROVIDER_SITE_OTHER): Payer: Medicare Other | Admitting: Neurology

## 2018-05-29 VITALS — BP 110/68 | HR 78 | Ht 71.0 in | Wt 209.5 lb

## 2018-05-29 DIAGNOSIS — E669 Obesity, unspecified: Secondary | ICD-10-CM | POA: Diagnosis not present

## 2018-05-29 DIAGNOSIS — E291 Testicular hypofunction: Secondary | ICD-10-CM | POA: Diagnosis not present

## 2018-05-29 DIAGNOSIS — E782 Mixed hyperlipidemia: Secondary | ICD-10-CM

## 2018-05-29 DIAGNOSIS — I639 Cerebral infarction, unspecified: Secondary | ICD-10-CM | POA: Diagnosis not present

## 2018-05-29 DIAGNOSIS — E1169 Type 2 diabetes mellitus with other specified complication: Secondary | ICD-10-CM | POA: Diagnosis not present

## 2018-05-29 DIAGNOSIS — R972 Elevated prostate specific antigen [PSA]: Secondary | ICD-10-CM | POA: Diagnosis not present

## 2018-05-29 DIAGNOSIS — I4891 Unspecified atrial fibrillation: Secondary | ICD-10-CM

## 2018-05-29 NOTE — Patient Instructions (Signed)
1.  We will check carotid ultrasound 2.  Continue your other medications 3.  Increase exercise 4.  Follow Mediterranean diet   Mediterranean Diet A Mediterranean diet refers to food and lifestyle choices that are based on the traditions of countries located on the [employer]. This way of eating has been shown to help prevent certain conditions and improve outcomes for people who have chronic diseases, like kidney disease and heart disease. What are tips for following this plan? Lifestyle  Cook and eat meals together with your family, when possible.  Drink enough fluid to keep your urine clear or pale yellow.  Be physically active every day. This includes: ? Aerobic exercise like running or swimming. ? Leisure activities like gardening, walking, or housework.  Get 7-8 hours of sleep each night.  If recommended by your health care provider, drink red wine in moderation. This means 1 glass a day for nonpregnant women and 2 glasses a day for men. A glass of wine equals 5 oz (150 mL). Reading food labels   Check the serving size of packaged foods. For foods such as rice and pasta, the serving size refers to the amount of cooked product, not dry.  Check the total fat in packaged foods. Avoid foods that have saturated fat or trans fats.  Check the ingredients list for added sugars, such as corn syrup. Shopping  At the grocery store, buy most of your food from the areas near the walls of the store. This includes: ? Fresh fruits and vegetables (produce). ? Grains, beans, nuts, and seeds. Some of these may be available in unpackaged forms or large amounts (in bulk). ? Fresh seafood. ? Poultry and eggs. ? Low-fat dairy products.  Buy whole ingredients instead of prepackaged foods.  Buy fresh fruits and vegetables in-season from local farmers markets.  Buy frozen fruits and vegetables in resealable bags.  If you do not have access to quality fresh seafood, buy precooked frozen  shrimp or canned fish, such as tuna, salmon, or sardines.  Buy small amounts of raw or cooked vegetables, salads, or olives from the deli or salad bar at your store.  Stock your pantry so you always have certain foods on hand, such as olive oil, canned tuna, canned tomatoes, rice, pasta, and beans. Cooking  Cook foods with extra-virgin olive oil instead of using butter or other vegetable oils.  Have meat as a side dish, and have vegetables or grains as your main dish. This means having meat in small portions or adding small amounts of meat to foods like pasta or stew.  Use beans or vegetables instead of meat in common dishes like chili or lasagna.  Experiment with different cooking methods. Try roasting or broiling vegetables instead of steaming or sauteing them.  Add frozen vegetables to soups, stews, pasta, or rice.  Add nuts or seeds for added healthy fat at each meal. You can add these to yogurt, salads, or vegetable dishes.  Marinate fish or vegetables using olive oil, lemon juice, garlic, and fresh herbs. Meal planning   Plan to eat 1 vegetarian meal one day each week. Try to work up to 2 vegetarian meals, if possible.  Eat seafood 2 or more times a week.  Have healthy snacks readily available, such as: ? Vegetable sticks with hummus. ? [place] yogurt. ? Fruit and nut trail mix.  Eat balanced meals throughout the week. This includes: ? Fruit: 2-3 servings a day ? Vegetables: 4-5 servings a day ? Low-fat dairy:  2 servings a day ? Fish, poultry, or lean meat: 1 serving a day ? Beans and legumes: 2 or more servings a week ? Nuts and seeds: 1-2 servings a day ? Whole grains: 6-8 servings a day ? Extra-virgin olive oil: 3-4 servings a day  Limit red meat and sweets to only a few servings a month What are my food choices?  Mediterranean diet ? Recommended ? Grains: Whole-grain pasta. Brown rice. Bulgar wheat. Polenta. Couscous. Whole-wheat bread. [place]. ? Vegetables: Artichokes. Beets. Broccoli. Cabbage. Carrots. Eggplant. Green beans. Chard. Kale. Spinach. Onions. Leeks. Peas. Squash. Tomatoes. Peppers. Radishes. ? Fruits: Apples. Apricots. Avocado. Berries. Bananas. Cherries. Dates. Figs. Grapes. Lemons. Melon. Oranges. Peaches. Plums. Pomegranate. ? Meats and other protein foods: Beans. Almonds. Sunflower seeds. Pine nuts. Peanuts. [place]. Salmon. Scallops. Shrimp. [place]. Tilapia. Clams. Oysters. Eggs. ? Dairy: Low-fat milk. Cheese. Greek yogurt. ? Beverages: Water. Red wine. Herbal tea. ? Fats and oils: Extra virgin olive oil. Avocado oil. Grape seed oil. ? Sweets and desserts: [place] yogurt with honey. Baked apples. Poached pears. Trail mix. ? Seasoning and other foods: Basil. Cilantro. Coriander. Cumin. Mint. Parsley. Sage. Rosemary. Tarragon. Garlic. Oregano. Thyme. Pepper. Balsalmic vinegar. Tahini. Hummus. Tomato sauce. Olives. Mushrooms. ? Limit these ? Grains: Prepackaged pasta or rice dishes. Prepackaged cereal with added sugar. ? Vegetables: Deep fried potatoes (french fries). ? Fruits: Fruit canned in syrup. ? Meats and other protein foods: Beef. Pork. Lamb. Poultry with skin. Hot dogs. [place] Salines. ? Dairy: Ice cream. Sour cream. Whole milk. ? Beverages: Juice. Sugar-sweetened soft drinks. Beer. Liquor and spirits. ? Fats and oils: Butter. Canola oil. Vegetable oil. Beef fat (tallow). Lard. ? Sweets and desserts: Cookies. Cakes. Pies. Candy. ? Seasoning and other foods: Mayonnaise. Premade sauces and marinades. ? The items listed may not be a complete list. Talk with your dietitian about what dietary choices are right for you. Summary  The Mediterranean diet includes both food and lifestyle choices.  Eat a variety of fresh fruits and vegetables, beans, nuts, seeds, and whole grains.  Limit the amount of red meat and sweets that you eat.  Talk with your health care provider about whether it is safe for you to drink red wine in  moderation. This means 1 glass a day for nonpregnant women and 2 glasses a day for men. A glass of wine equals 5 oz (150 mL). This information is not intended to replace advice given to you by your health care provider. Make sure you discuss any questions you have with your health care provider. Document Released: 01/15/2016 Document Revised: 02/17/2016 Document Reviewed: 01/15/2016 Elsevier Interactive Patient Education  2019 [employer].

## 2018-06-05 ENCOUNTER — Ambulatory Visit (INDEPENDENT_AMBULATORY_CARE_PROVIDER_SITE_OTHER): Payer: Medicare Other | Admitting: Adult Health

## 2018-06-05 ENCOUNTER — Encounter: Payer: Self-pay | Admitting: Adult Health

## 2018-06-05 VITALS — BP 108/72 | HR 76 | Ht 71.0 in | Wt 209.2 lb

## 2018-06-05 DIAGNOSIS — E6609 Other obesity due to excess calories: Secondary | ICD-10-CM

## 2018-06-05 DIAGNOSIS — G4700 Insomnia, unspecified: Secondary | ICD-10-CM

## 2018-06-05 DIAGNOSIS — G4733 Obstructive sleep apnea (adult) (pediatric): Secondary | ICD-10-CM | POA: Diagnosis not present

## 2018-06-05 DIAGNOSIS — R3912 Poor urinary stream: Secondary | ICD-10-CM | POA: Diagnosis not present

## 2018-06-05 DIAGNOSIS — E291 Testicular hypofunction: Secondary | ICD-10-CM | POA: Diagnosis not present

## 2018-06-05 DIAGNOSIS — I639 Cerebral infarction, unspecified: Secondary | ICD-10-CM | POA: Diagnosis not present

## 2018-06-05 DIAGNOSIS — N401 Enlarged prostate with lower urinary tract symptoms: Secondary | ICD-10-CM | POA: Diagnosis not present

## 2018-06-05 DIAGNOSIS — Z6830 Body mass index (BMI) 30.0-30.9, adult: Secondary | ICD-10-CM

## 2018-06-05 DIAGNOSIS — R972 Elevated prostate specific antigen [PSA]: Secondary | ICD-10-CM | POA: Diagnosis not present

## 2018-06-05 NOTE — Assessment & Plan Note (Signed)
Well controlled on CPAP   Plan  Patient Instructions  Order to DME for new CPAP machine, supplies, and download.  Continue on CPAP At bedtime   Goal is at least 6 hrs each night.  Do not drive if sleepy.  Work on healthy weight.  Follow up with Dr. Elsworth Soho  In 1 year and As needed

## 2018-06-05 NOTE — Assessment & Plan Note (Signed)
Wt loss  

## 2018-06-05 NOTE — Progress Notes (Signed)
@Patient  ID: [name], male    DOB: [birth year], 67 y.o.   MRN: [id number]  Chief Complaint  Patient presents with  . Follow-up    OSA    Referring provider: [name], MD  HPI: 67 year old followed for obstructive sleep apnea and insomnia Medical history significant for A. fib and nonischemic cardiomyopathy with a EF of 40%  TEST/EVENTS :  PSG dec '11>> - weighed 225 pounds,BMI of 31, neck size of 14 inches Sleep was very fragmented due to frequent arousals and awakenings, especially in the second half of the night with CPAP. He was desensitized with a standard nasal mask. Note that he took 2 tablets of temazepam of 15 mg prior to the study. >>AHI was 42/h with lowest desatn of 86 % >>corrected by CPAP of 8 cm with a nasal mask. Download 12/28 -07/02/10 >> good compliance avg 6.5h, AHI 15/h, centrals 9/h, obstructive 4.5/h, avg pr 8 cm  Download on 10 cm 2/28-3/28/12 >> few residual events, some central apneas persist   PFT -> 01/2010 - TLC 77%, DLCO 56%, FVC 3.5L/72%, RAtio 77 ,FEV 1 78%   06/05/2018 Follow up : OSA Patient presents for a follow-up for severe sleep apnea.  Patient was last seen in 2017.  Patient says he still wears CPAP each night.  Gets in about 6 hours.  He does have chronic insomnia.  Uses temazepam at bedtime.  Does not have a CPAP SD card. Requested a CPAP download .  Requests new CPAP machine, says it is getting old .  Says he can not go without CPAP . Insomnia is okay but he is very sensitive to waking up easily . Uses black out curtains and cool room to help with sleep regimen .   Stays active during daytime. Does have chronic knee problems that limit him.    Allergies  Allergen Reactions  . Sulfonamide Derivatives Other (See Comments)    unknown reaction.  Patient states had to go to the ER.   [name] Zolpidem Other (See Comments)    Excessive, prolonged sedation    Immunization History  Administered Date(s) Administered  . Influenza Split  03/03/2011, 03/10/2012, 04/07/2018  . Influenza Whole 03/28/2008, 03/30/2010  . Influenza,inj,Quad PF,6+ Mos 01/31/2013, 03/07/2014, 04/14/2015, 02/19/2016, 03/18/2017  . Pneumococcal Polysaccharide-23 10/07/2009  . Td 09/05/2009    Past Medical History:  Diagnosis Date  . AORTIC STENOSIS, MODERATE 12/05/2009  . Atrial fibrillation ([place])   . Atrial fibrillation with RVR ([place]) 07/05/2017  . Atrial flutter ([place]) 12/05/2009  . Atypical chest pain 11/08/2011  . BENIGN PROSTATIC HYPERTROPHY, HX OF 12/05/2009  . CHEST PAIN-UNSPECIFIED 11/11/2009  . CHF 12/05/2009  . Chronic renal insufficiency 12/16/2016  . Debility 04/18/2012  . Diarrhea 12/16/2016  . DYSPNEA 12/19/2009  . Edema 04/18/2012  . ERECTILE DYSFUNCTION 01/25/2007  . FATIGUE, CHRONIC 11/11/2009  . Gout 07/10/2012  . Heart murmur   . History of kidney stones    "they passed" (07/05/2017)  . Hyperkalemia 04/18/2012  . Hyperlipidemia   . Hypertension   . HYPERTENSION 01/25/2007  . Hypoxia 05/05/2011  . Insomnia 05/13/2016  . INSOMNIA, HX OF 01/25/2007  . Knee pain, bilateral 12/28/2010  . Knee pain, right 12/28/2010  . NEUROMA 01/05/2010  . OBESITY NOS 01/25/2007  . OSA on CPAP 04/06/2010  . PERIPHERAL NEUROPATHY 01/05/2010  . PULMONARY FUNCTION TESTS, ABNORMAL 02/02/2010  . Superficial thrombophlebitis of left leg 09/07/2011  . TESTICULAR HYPOFUNCTION 01/05/2010  . 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 Toe  pain, right 07/10/2012  . Type II diabetes mellitus ([place]) 12/05/2009  . UNSPECIFIED ANEMIA 01/05/2010    Tobacco History: Social History   Tobacco Use  Smoking Status Former Smoker  . Packs/day: 1.00  . Years: 10.00  . Pack years: 10.00  . Last attempt to quit: 06/07/1980  . Years since quitting: 38.0  Smokeless Tobacco Never Used   Counseling given: Not Answered   Outpatient Medications Prior to Visit  Medication Sig Dispense Refill  . albuterol (VENTOLIN HFA) 108 (90 Base) MCG/ACT inhaler Inhale 2 puffs into the lungs every 6 (six) hours as  needed for wheezing or shortness of breath. 1 Inhaler 6  . allopurinol (ZYLOPRIM) 300 MG tablet TAKE 1 TABLET BY MOUTH ONCE DAILY 90 tablet 2  . benazepril (LOTENSIN) 10 MG tablet Take 0.5 tablets (5 mg total) by mouth daily.  3  . calcium carbonate (TUMS - DOSED IN MG ELEMENTAL CALCIUM) 500 MG chewable tablet Chew 1 tablet by mouth daily as needed. For heartburn    . cetirizine (ZYRTEC) 10 MG tablet Take 1 tablet (10 mg total) by mouth daily. 30 tablet 11  . Clobetasol Prop Emollient Base (CLOBETASOL PROPIONATE E) 0.05 % emollient cream Apply 1 application topically 2 (two) times daily as needed (insect bite). 60 g 1  . clotrimazole-betamethasone (LOTRISONE) cream Apply 1 application topically 2 (two) times daily. 30 g 1  . colchicine (COLCRYS) 0.6 MG tablet TAKE ONE TABLET BY MOUTH DAILY AS NEEDED FOR GOUT FLARE UPS. 30 tablet 0  . dofetilide (TIKOSYN) 500 MCG capsule TAKE 1 CAPSULE(500 MCG) BY MOUTH TWICE DAILY 180 capsule 2  . fish oil-omega-3 fatty acids 1000 MG capsule Take 1 g by mouth daily.     . furosemide (LASIX) 40 MG tablet TAKE TWO TABLETS BY MOUTH EVERY MORNING AND TAKE ONE TABLET BY MOUTH EVERY AFTERNOON 270 tablet 3  . glyBURIDE (DIABETA) 5 MG tablet TAKE 1 TABLET BY MOUTH DAILY WITH BREAKFAST 90 tablet 0  . HYDROcodone-acetaminophen (NORCO) 10-325 MG tablet Take 1 tablet by mouth every 8 (eight) hours as needed for moderate pain or severe pain. 90 tablet 0  . KLOR-CON M20 20 MEQ tablet TAKE 1 TABLET BY MOUTH ONCE DAILY AND  EXTRA  TABLET  IF  WEIGHT  IS  ABOVE  210  LBS 180 tablet 2  . latanoprost (XALATAN) 0.005 % ophthalmic solution INSTILL 1 DROP INTO EACH EYE ONCE DAILY IN THE EVENING  5  . LORazepam (ATIVAN) 1 MG tablet Take 1 tablet (1 mg total) by mouth every 8 (eight) hours as needed for anxiety. 30 tablet 3  . magnesium oxide (MAG-OX) 400 MG tablet Take 1 tablet (400 mg total) by mouth daily. 30 tablet 11  . metFORMIN (GLUCOPHAGE) 500 MG tablet TAKE 1 TABLET BY MOUTH THREE  TIMES DAILY 270 tablet 0  . metoprolol succinate (TOPROL-XL) 100 MG 24 hr tablet TAKE 1 TABLET BY MOUTH TWICE DAILY TAKE  WITH  OR  IMMEDIATELY  FOLLOWING  A  MEAL 180 tablet 0  . Multiple Vitamins-Minerals (MULTIVITAMIN PO) Take 1 tablet by mouth daily.    . pravastatin (PRAVACHOL) 40 MG tablet TAKE 1 TABLET BY MOUTH ONCE DAILY 90 tablet 3  . ranolazine (RANEXA) 500 MG 12 hr tablet Take 1 tablet (500 mg total) by mouth 2 (two) times daily. 60 tablet 5  . temazepam (RESTORIL) 30 MG capsule TAKE 1 CAPSULE BY MOUTH AT BEDTIME AS NEEDED FOR SLEEP 30 capsule 5  . Testosterone 10 MG/ACT (  2%) GEL Apply 1 application topically daily. Use as directed    . warfarin (COUMADIN) 4 MG tablet TAKE 1/2 TO 1 (ONE-HALF TO ONE) TABLET BY MOUTH DAILY OR AS DIRECTED BY COUMADIN CLINIC 90 tablet 1   No facility-administered medications prior to visit.      Review of Systems  Constitutional:   No  weight loss, night sweats,  Fevers, chills, fatigue, or  lassitude.  HEENT:   No headaches,  Difficulty swallowing,  Tooth/dental problems, or  Sore throat,                No sneezing, itching, ear ache, nasal congestion, post nasal drip,   CV:  No chest pain,  Orthopnea, PND, swelling in lower extremities, anasarca, dizziness, palpitations, syncope.   GI  No heartburn, indigestion, abdominal pain, nausea, vomiting, diarrhea, change in bowel habits, loss of appetite, bloody stools.   Resp: No shortness of breath with exertion or at rest.  No excess mucus, no productive cough,  No non-productive cough,  No coughing up of blood.  No change in color of mucus.  No wheezing.  No chest wall deformity  Skin: no rash or lesions.  GU: no dysuria, change in color of urine, no urgency or frequency.  No flank pain, no hematuria   MS:  No joint pain or swelling.  No decreased range of motion.  No back pain.    Physical Exam  BP 108/72 (BP Location: Left Arm, Cuff Size: Normal)   Pulse 76   Ht 5\' 11"  (1.803 m)   Wt 209  lb 3.2 oz (94.9 kg)   SpO2 96%   BMI 29.18 kg/m   GEN: A/Ox3; pleasant , NAD, well nourished    HEENT:  Ali Molina/AT,  EACs-clear, TMs-wnl, NOSE-clear, THROAT-clear, no lesions, no postnasal drip or exudate noted.   NECK:  Supple w/ fair ROM; no JVD; normal carotid impulses w/o bruits; no thyromegaly or nodules palpated; no lymphadenopathy.    RESP  Clear  P & A; w/o, wheezes/ rales/ or rhonchi. no accessory muscle use, no dullness to percussion  CARD:  RRR, no m/r/g, no peripheral edema, pulses intact, no cyanosis or clubbing.  GI:   Soft & nt; nml bowel sounds; no organomegaly or masses detected.   Musco: Warm bil, no deformities or joint swelling noted.   Neuro: alert, no focal deficits noted.    Skin: Warm, no lesions or rashes    Lab Results:  CBC    Component Value Date/Time   WBC 3.7 (L) 05/02/2018 1523   RBC 5.78 05/02/2018 1523   HGB 14.7 05/02/2018 1523   HGB 13.7 06/14/2017 1038   HCT 46.5 05/02/2018 1523   HCT 41.0 06/14/2017 1038   PLT 190.0 05/02/2018 1523   PLT 204 06/14/2017 1038   MCV 80.5 05/02/2018 1523   MCV 80 06/14/2017 1038   MCH 26.3 07/08/2017 0533   MCHC 31.7 05/02/2018 1523   RDW 18.0 (H) 05/02/2018 1523   RDW 16.0 (H) 06/14/2017 1038   LYMPHSABS 1.7 12/16/2016 1121   MONOABS 0.7 12/16/2016 1121   EOSABS 0.1 12/16/2016 1121   BASOSABS 0.0 12/16/2016 1121    BMET    Component Value Date/Time   NA 140 05/02/2018 1523   NA 142 07/11/2017 1049   K 4.5 05/02/2018 1523   CL 104 05/02/2018 1523   CO2 27 05/02/2018 1523   GLUCOSE 72 05/02/2018 1523   BUN 54 (H) 05/02/2018 1523   BUN 28 (H) 07/11/2017 1049  CREATININE 2.11 (H) 05/02/2018 1523   CREATININE 1.39 (H) 03/24/2015 0943   CALCIUM 10.0 05/02/2018 1523   GFRNONAA 44 (L) 07/11/2017 1049   GFRAA 50 (L) 07/11/2017 1049    BNP No results found for: BNP  ProBNP    Component Value Date/Time   PROBNP 302.0 (H) 09/16/2010 0600    Imaging: No results found.    No flowsheet  data found.  No results found for: NITRICOXIDE      Assessment & Plan:   OSA (obstructive sleep apnea) Well controlled on CPAP   Plan  Patient Instructions  Order to DME for new CPAP machine, supplies, and download.  Continue on CPAP At bedtime   Goal is at least 6 hrs each night.  Do not drive if sleepy.  Work on healthy weight.  Follow up with Dr. Elsworth Soho  In 1 year and As needed          Obesity Wt loss   Insomnia Healthy sleep regimen discussed      Rexene Edison, NP 06/05/2018

## 2018-06-05 NOTE — Patient Instructions (Signed)
Order to DME for new CPAP machine, supplies, and download.  Continue on CPAP At bedtime   Goal is at least 6 hrs each night.  Do not drive if sleepy.  Work on healthy weight.  Follow up with Dr. Elsworth Soho  In 1 year and As needed

## 2018-06-05 NOTE — Assessment & Plan Note (Signed)
Healthy sleep regimen discussed  °

## 2018-06-09 ENCOUNTER — Ambulatory Visit (INDEPENDENT_AMBULATORY_CARE_PROVIDER_SITE_OTHER): Payer: Medicare Other | Admitting: Pharmacist

## 2018-06-09 DIAGNOSIS — I480 Paroxysmal atrial fibrillation: Secondary | ICD-10-CM

## 2018-06-09 DIAGNOSIS — Z7901 Long term (current) use of anticoagulants: Secondary | ICD-10-CM | POA: Diagnosis not present

## 2018-06-09 LAB — POCT INR: INR: 2.6 (ref 2.0–3.0)

## 2018-06-22 ENCOUNTER — Telehealth: Payer: Self-pay | Admitting: Adult Health

## 2018-06-22 DIAGNOSIS — G4733 Obstructive sleep apnea (adult) (pediatric): Secondary | ICD-10-CM

## 2018-06-22 NOTE — Telephone Encounter (Signed)
Patient seen by TP on 12.30.19 and replacement CPAP machine and supplies ordered with 30 day download requested.  03/11/18 - 06/08/2018 CPAP download received and reviewed by TP: has few events, make sure he has new supplies.  Order for new machine done at visit, check download 4 weeks after starting new machine.  Called spoke with patient, discussed the above.  Patient due to pick up replacement machine on 1.21.2020.  Patient aware 30 day download will be obtained once he begins new machine and will call him with results.  Order placed to DME to assist with 30 day download. Nothing further needed; will sign off.

## 2018-06-23 ENCOUNTER — Other Ambulatory Visit: Payer: Self-pay | Admitting: Cardiovascular Disease

## 2018-06-23 ENCOUNTER — Other Ambulatory Visit: Payer: Self-pay | Admitting: Family Medicine

## 2018-06-23 DIAGNOSIS — E1169 Type 2 diabetes mellitus with other specified complication: Secondary | ICD-10-CM

## 2018-06-23 DIAGNOSIS — Z7901 Long term (current) use of anticoagulants: Secondary | ICD-10-CM

## 2018-06-23 DIAGNOSIS — H409 Unspecified glaucoma: Secondary | ICD-10-CM

## 2018-06-23 DIAGNOSIS — D649 Anemia, unspecified: Secondary | ICD-10-CM

## 2018-06-23 DIAGNOSIS — I10 Essential (primary) hypertension: Secondary | ICD-10-CM

## 2018-06-23 DIAGNOSIS — E669 Obesity, unspecified: Secondary | ICD-10-CM

## 2018-06-23 DIAGNOSIS — E785 Hyperlipidemia, unspecified: Secondary | ICD-10-CM

## 2018-06-23 DIAGNOSIS — E875 Hyperkalemia: Secondary | ICD-10-CM

## 2018-06-23 DIAGNOSIS — G4700 Insomnia, unspecified: Secondary | ICD-10-CM

## 2018-06-23 DIAGNOSIS — M25562 Pain in left knee: Secondary | ICD-10-CM

## 2018-06-23 DIAGNOSIS — M25561 Pain in right knee: Secondary | ICD-10-CM

## 2018-06-23 DIAGNOSIS — F411 Generalized anxiety disorder: Secondary | ICD-10-CM

## 2018-06-30 ENCOUNTER — Ambulatory Visit: Payer: Self-pay | Admitting: *Deleted

## 2018-06-30 ENCOUNTER — Telehealth: Payer: Self-pay | Admitting: Pulmonary Disease

## 2018-06-30 DIAGNOSIS — G4733 Obstructive sleep apnea (adult) (pediatric): Secondary | ICD-10-CM

## 2018-06-30 NOTE — Telephone Encounter (Signed)
Summary: Call back    Patient takes dofetilide (TIKOSYN) 500 MCG capsule, he has a cold and would like to know what he can take over the counter with this medication, please advise.      Patient is trying to be very careful with his medications he has a slight cough that is bothersome at night. He was really under the weather last week- but he got better from that.  Patient would like something safe for the cough- when he uses his CPAP machine it makes it a little difficult.Patient does not have phlegm in the chest- more of a dry cough.He needs something to help him relax at night.  Patient is aware PCP not in- but would like advisement for the weekend if possible.

## 2018-06-30 NOTE — Telephone Encounter (Signed)
Spoke with pt. He is needing an order for new CPAP supplies. Order has been placed. Nothing further was needed.

## 2018-07-03 ENCOUNTER — Telehealth: Payer: Self-pay | Admitting: Adult Health

## 2018-07-03 NOTE — Telephone Encounter (Signed)
Can use Mucinex twice a day or [place] HBP chest congestion and cough

## 2018-07-03 NOTE — Telephone Encounter (Signed)
Pt is calling back [phone]

## 2018-07-03 NOTE — Telephone Encounter (Signed)
TP ordered replacement CPAP at 12.20.2019 visit Received documentation from [facility] stating that new CPAP was set up on 1.23.2020 Patient will need to be seen for follow up by 4.23.2020  LMOM TCB x1

## 2018-07-03 NOTE — Telephone Encounter (Signed)
Please advise 

## 2018-07-03 NOTE — Telephone Encounter (Signed)
Spoke with pt, advised him that he would need to set up an appt for CPAP follow up. Pt agreed and make a follow up appt with TP 08/15/2018 at 2 pm. Nothing further is needed.

## 2018-07-04 NOTE — Telephone Encounter (Signed)
Let patient know he voiced his understanding

## 2018-07-21 ENCOUNTER — Ambulatory Visit: Payer: Medicare Other | Admitting: Cardiovascular Disease

## 2018-07-24 ENCOUNTER — Encounter: Payer: Self-pay | Admitting: Adult Health

## 2018-07-25 ENCOUNTER — Ambulatory Visit (INDEPENDENT_AMBULATORY_CARE_PROVIDER_SITE_OTHER): Payer: Medicare Other | Admitting: Cardiology

## 2018-07-25 ENCOUNTER — Ambulatory Visit (INDEPENDENT_AMBULATORY_CARE_PROVIDER_SITE_OTHER): Payer: Medicare Other | Admitting: Pharmacist

## 2018-07-25 ENCOUNTER — Encounter: Payer: Self-pay | Admitting: Cardiology

## 2018-07-25 VITALS — BP 106/76 | HR 74 | Ht 71.0 in | Wt 209.6 lb

## 2018-07-25 DIAGNOSIS — I480 Paroxysmal atrial fibrillation: Secondary | ICD-10-CM

## 2018-07-25 DIAGNOSIS — Z7901 Long term (current) use of anticoagulants: Secondary | ICD-10-CM | POA: Diagnosis not present

## 2018-07-25 DIAGNOSIS — I10 Essential (primary) hypertension: Secondary | ICD-10-CM

## 2018-07-25 DIAGNOSIS — I4891 Unspecified atrial fibrillation: Secondary | ICD-10-CM | POA: Diagnosis not present

## 2018-07-25 LAB — POCT INR: INR: 2.5 (ref 2.0–3.0)

## 2018-07-25 NOTE — Patient Instructions (Signed)
Medication Instructions:  Your physician recommends that you continue on your current medications as directed. Please refer to the Current Medication list given to you today. If you need a refill on your cardiac medications before your next appointment, please call your pharmacy.   Lab work: None  If you have labs (blood work) drawn today and your tests are completely normal, you will receive your results only by: [name] MyChart Message (if you have MyChart) OR . A paper copy in the mail If you have any lab test that is abnormal or we need to change your treatment, we will call you to review the results.  Testing/Procedures: none  Follow-Up: At [facility], you and your health needs are our priority.  As part of our continuing mission to provide you with exceptional heart care, we have created designated Provider Care Teams.  These Care Teams include your primary Cardiologist (physician) and Advanced Practice Providers (APPs -  Physician Assistants and Nurse Practitioners) who all work together to provide you with the care you need, when you need it. You will need a follow up appointment in 6 months.  Please call our office 2 months in advance to schedule this appointment.  You may see Sanda Klein, MD or one of the following Advanced Practice Providers on your designated Care Team: [place], [place] . Fabian Sharp, PA-C  Any Other Special Instructions Will Be Listed Below (If Applicable).

## 2018-07-25 NOTE — Progress Notes (Signed)
07/25/2018 [name]   1951/01/12  [id number]  Primary Physician [name], MD Primary Cardiologist: dr Sallyanne Kuster  HPI: 68 y/o AA male with a history of chronic mixed systolic and diastolic heart failure related to paroxysmal atrial fibrillation and atrial flutter (s/p cardioversion 06/17/17, on Tikosyn, Coumadin, and metoprolol), aortic insufficiency, HTN, CRI-3, OSA on C-pap, dyslipidemia, and DM2. He was admitted with recurrent PAF 07/05/17 and was re admitted and seen in consult bt Dr Curt Bears.  Ranexa was added to Tikosyn and the pt underwent DCCV 07/08/17 to NSR. He was in the office 01/23/18 for follow up. He stopped the Ranexa Feb 2019.  He was been doing well fortunately remaining in NSR. 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  He has been seen by a nephrologist at [facility].   He is in the office today for routine follow up.  He had several questions about his medications.  He has apparently lost 15 lbs through diet changes.  This has changed his Cr clearance- now 47.  He asked about adjustment of his Tikosyn.  He continues to do well from a heart failure standpoint.  He has not had recurrent PAF by his history.    Current Outpatient Medications  Medication Sig Dispense Refill  . albuterol (VENTOLIN HFA) 108 (90 Base) MCG/ACT inhaler Inhale 2 puffs into the lungs every 6 (six) hours as needed for wheezing or shortness of breath. 1 Inhaler 6  . allopurinol (ZYLOPRIM) 300 MG tablet TAKE 1 TABLET BY MOUTH ONCE DAILY 90 tablet 0  . benazepril (LOTENSIN) 10 MG tablet Take 0.5 tablets (5 mg total) by mouth daily.  3  . calcium carbonate (TUMS - DOSED IN MG ELEMENTAL CALCIUM) 500 MG chewable tablet Chew 1 tablet by mouth daily as needed. For heartburn    . cetirizine (ZYRTEC) 10 MG tablet Take 1 tablet (10 mg total) by mouth daily. 30 tablet 11  . Clobetasol Prop Emollient Base (CLOBETASOL PROPIONATE E) 0.05 % emollient cream Apply 1 application topically 2 (two) times daily as needed (insect  bite). 60 g 1  . clotrimazole-betamethasone (LOTRISONE) cream Apply 1 application topically 2 (two) times daily. 30 g 1  . colchicine (COLCRYS) 0.6 MG tablet TAKE ONE TABLET BY MOUTH DAILY AS NEEDED FOR GOUT FLARE UPS. 30 tablet 0  . dofetilide (TIKOSYN) 500 MCG capsule TAKE 1 CAPSULE(500 MCG) BY MOUTH TWICE DAILY 180 capsule 2  . fish oil-omega-3 fatty acids 1000 MG capsule Take 1 g by mouth daily.     . furosemide (LASIX) 40 MG tablet TAKE TWO TABLETS BY MOUTH EVERY MORNING AND TAKE ONE TABLET BY MOUTH EVERY AFTERNOON 270 tablet 3  . glyBURIDE (DIABETA) 5 MG tablet TAKE 1 TABLET BY MOUTH ONCE DAILY WITH BREAKFAST 90 tablet 0  . HYDROcodone-acetaminophen (NORCO) 10-325 MG tablet Take 1 tablet by mouth every 8 (eight) hours as needed for moderate pain or severe pain. 90 tablet 0  . latanoprost (XALATAN) 0.005 % ophthalmic solution INSTILL 1 DROP INTO EACH EYE ONCE DAILY IN THE EVENING  5  . LORazepam (ATIVAN) 1 MG tablet Take 1 tablet (1 mg total) by mouth every 8 (eight) hours as needed for anxiety. 30 tablet 3  . magnesium oxide (MAG-OX) 400 MG tablet Take 1 tablet (400 mg total) by mouth daily. 30 tablet 11  . metFORMIN (GLUCOPHAGE) 500 MG tablet TAKE 1 TABLET BY MOUTH THREE TIMES DAILY 270 tablet 0  . metoprolol succinate (TOPROL-XL) 100 MG 24 hr tablet TAKE 1 TABLET  BY MOUTH TWICE DAILY WITH OR IMMEDIATELY FOLLOWING A MEAL. 180 tablet 2  . Multiple Vitamins-Minerals (MULTIVITAMIN PO) Take 1 tablet by mouth daily.    . potassium chloride SA (K-DUR,KLOR-CON) 20 MEQ tablet TAKE 1 TABLET BY MOUTH ONCE DAILY AND  EXTRA  TABLET  IF  WEIGHT  IS  ABOVE  210  LBS 180 tablet 2  . pravastatin (PRAVACHOL) 40 MG tablet TAKE 1 TABLET BY MOUTH ONCE DAILY 90 tablet 3  . temazepam (RESTORIL) 30 MG capsule TAKE 1 CAPSULE BY MOUTH AT BEDTIME AS NEEDED FOR SLEEP 30 capsule 5  . Testosterone 10 MG/ACT (2%) GEL Apply 1 application topically daily. Use as directed    . warfarin (COUMADIN) 4 MG tablet TAKE 1/2 TO 1  (ONE-HALF TO ONE) TABLET BY MOUTH DAILY OR AS DIRECTED BY COUMADIN CLINIC 90 tablet 1   No current facility-administered medications for this visit.     Allergies  Allergen Reactions  . Sulfonamide Derivatives Other (See Comments)    unknown reaction.  Patient states had to go to the ER.   [name] Zolpidem Other (See Comments)    Excessive, prolonged sedation    Past Medical History:  Diagnosis Date  . AORTIC STENOSIS, MODERATE 12/05/2009  . Atrial fibrillation ([place])   . Atrial fibrillation with RVR ([place]) 07/05/2017  . Atrial flutter ([place]) 12/05/2009  . Atypical chest pain 11/08/2011  . BENIGN PROSTATIC HYPERTROPHY, HX OF 12/05/2009  . CHEST PAIN-UNSPECIFIED 11/11/2009  . CHF 12/05/2009  . Chronic renal insufficiency 12/16/2016  . Debility 04/18/2012  . Diarrhea 12/16/2016  . DYSPNEA 12/19/2009  . Edema 04/18/2012  . ERECTILE DYSFUNCTION 01/25/2007  . FATIGUE, CHRONIC 11/11/2009  . Gout 07/10/2012  . Heart murmur   . History of kidney stones    "they passed" (07/05/2017)  . Hyperkalemia 04/18/2012  . Hyperlipidemia   . Hypertension   . HYPERTENSION 01/25/2007  . Hypoxia 05/05/2011  . Insomnia 05/13/2016  . INSOMNIA, HX OF 01/25/2007  . Knee pain, bilateral 12/28/2010  . Knee pain, right 12/28/2010  . NEUROMA 01/05/2010  . OBESITY NOS 01/25/2007  . OSA on CPAP 04/06/2010  . PERIPHERAL NEUROPATHY 01/05/2010  . PULMONARY FUNCTION TESTS, ABNORMAL 02/02/2010  . Superficial thrombophlebitis of left leg 09/07/2011  . TESTICULAR HYPOFUNCTION 01/05/2010  . 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 Toe pain, right 07/10/2012  . Type II diabetes mellitus ([place]) 12/05/2009  . UNSPECIFIED ANEMIA 01/05/2010    Social History   Socioeconomic History  . Marital status: Significant Other    Spouse name: Not on file  . Number of children: 4  . Years of education: 51  . Highest education level: High school graduate  Occupational History  . Occupation: retired    Fish farm manager: [employer]  Social Needs  . Financial resource strain: Not  on file  . Food insecurity:    Worry: Not on file    Inability: Not on file  . Transportation needs:    Medical: Not on file    Non-medical: Not on file  Tobacco Use  . Smoking status: Former Smoker    Packs/day: 1.00    Years: 10.00    Pack years: 10.00    Last attempt to quit: 06/07/1980    Years since quitting: 38.1  . Smokeless tobacco: Never Used  Substance and Sexual Activity  . Alcohol use: No  . Drug use: No  . Sexual activity: Not Currently  Lifestyle  . Physical activity:    Days per week: Not on file  Minutes per session: Not on file  . Stress: Not on file  Relationships  . Social connections:    Talks on phone: Not on file    Gets together: Not on file    Attends religious service: Not on file    Active member of club or organization: Not on file    Attends meetings of clubs or organizations: Not on file    Relationship status: Not on file  . Intimate partner violence:    Fear of current or ex partner: Not on file    Emotionally abused: Not on file    Physically abused: Not on file    Forced sexual activity: Not on file  Other Topics Concern  . Not on file  Social History Narrative   Lives with male partner in a one story home.  His son lives there off and on.  Retired from [employer].  Education: high school.       Family History  Problem Relation Age of Onset  . Clotting disorder Mother   . Heart disease Mother        s/p MI  . Heart attack Mother   . Hypertension Mother   . Diabetes Mother   . Hyperlipidemia Mother   . Stroke Mother   . Cancer Father        ? lung  . Lung disease Father        smoker  . Diabetes Sister   . Hypertension Sister        smoker  . Leukemia Maternal Grandmother        ?  [name] Aneurysm Sister        brain  . Other Sister        clipped  . Seizures Sister        d/o w/aneurysm/ smoker     Review of Systems: General: negative for chills, fever, night sweats or weight changes.  Cardiovascular: negative for  chest pain, dyspnea on exertion, edema, orthopnea, palpitations, paroxysmal nocturnal dyspnea or shortness of breath Dermatological: negative for rash Respiratory: negative for cough or wheezing Urologic: negative for hematuria Abdominal: negative for nausea, vomiting, diarrhea, bright red blood per rectum, melena, or hematemesis Neurologic: negative for visual changes, syncope, or dizziness All other systems reviewed and are otherwise negative except as noted above.    Blood pressure 106/76, pulse 74, height 5\' 11"  (1.803 m), weight 209 lb 9.6 oz (95.1 kg).  General appearance: alert, cooperative and no distress Lungs: clear to auscultation bilaterally Heart: regular rate and rhythm and 2/6 AI murmur Extremities: no edema Neurologic: Grossly normal  EKG NSR, QTc 472, two PVCs  ASSESSMENT AND PLAN:   PAF (paroxysmal atrial fibrillation) ([place]) He is holding NSR.  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  Long term (current) use of anticoagulants Coumadin Rx  PLAN  Will discuss with MD but it looks like his Tikosyn will need to be decreased (250 mg BID if Scr clearance 40-60). I'll get his most recent labs from [facility] and review decreasing his Tikosyn with Dr Sallyanne Kuster.   Kerin Ransom PA-C 07/25/2018 11:00 AM

## 2018-07-27 ENCOUNTER — Telehealth: Payer: Self-pay

## 2018-07-27 ENCOUNTER — Encounter: Payer: Self-pay | Admitting: Cardiology

## 2018-07-27 MED ORDER — DOFETILIDE 250 MCG PO CAPS: 250.0000 ug | ORAL_CAPSULE | Freq: Two times a day (BID) | ORAL | 10 refills | Status: DC

## 2018-07-27 NOTE — Telephone Encounter (Signed)
Patient notified and voiced understanding.

## 2018-07-27 NOTE — Telephone Encounter (Signed)
-----   Message from Erlene Quan, [place] sent at 07/27/2018  7:48 AM EST ----- Please instruct the patient to decrease his Tikosyn to 250 mg BID.  Kerin Ransom PA-C 07/27/2018 7:49 AM

## 2018-07-27 NOTE — Progress Notes (Signed)
Lab from [place] confirm pt's Cr clearance-49. Will instruct patient to decrease Tikosyn to 250 mg BID.   Kerin Ransom PA-C 07/27/2018 7:47 AM

## 2018-07-28 NOTE — Progress Notes (Signed)
Got it. I saw you reduced tikosyn. MCr

## 2018-07-31 ENCOUNTER — Telehealth: Payer: Self-pay | Admitting: Adult Health

## 2018-07-31 DIAGNOSIS — G4733 Obstructive sleep apnea (adult) (pediatric): Secondary | ICD-10-CM

## 2018-07-31 NOTE — Telephone Encounter (Signed)
TP ordered replacement CPAP at 12.30.19 visit  Per 1.27.2020 phone note, documentation was received from [facility] stating that patient received CPAP and needs follow up prior to 4.23.2020 - patient is scheduled to see TP on 3.10.2020  Received patient's initial compliance CPAP download >> shows excellent compliance but AHI = 7  Will forward to TP to view download and determine if any changes need to be made prior to appt Download in TP's lookat folder

## 2018-08-01 NOTE — Telephone Encounter (Signed)
Per TP: let's increase pressure to auto 8-12cm and we will do another download at the 08/15/2018 office visit

## 2018-08-01 NOTE — Telephone Encounter (Signed)
Called spoke with patient, discussed download results/recs as stated by TP Patient voiced his understanding and denied any questions/concerns at this time Order sent to [facility] Patient aware to keep 3.10.2020 appt with TP  Nothing further needed; will sign off

## 2018-08-03 ENCOUNTER — Other Ambulatory Visit (INDEPENDENT_AMBULATORY_CARE_PROVIDER_SITE_OTHER): Payer: Medicare Other

## 2018-08-03 ENCOUNTER — Encounter: Payer: Self-pay | Admitting: Family Medicine

## 2018-08-03 ENCOUNTER — Ambulatory Visit (INDEPENDENT_AMBULATORY_CARE_PROVIDER_SITE_OTHER): Payer: Medicare Other | Admitting: Family Medicine

## 2018-08-03 VITALS — BP 108/70 | HR 81 | Temp 97.6°F | Resp 18 | Wt 209.6 lb

## 2018-08-03 DIAGNOSIS — Z79899 Other long term (current) drug therapy: Secondary | ICD-10-CM | POA: Diagnosis not present

## 2018-08-03 DIAGNOSIS — D649 Anemia, unspecified: Secondary | ICD-10-CM | POA: Diagnosis not present

## 2018-08-03 DIAGNOSIS — E669 Obesity, unspecified: Secondary | ICD-10-CM | POA: Diagnosis not present

## 2018-08-03 DIAGNOSIS — I4892 Unspecified atrial flutter: Secondary | ICD-10-CM

## 2018-08-03 DIAGNOSIS — M25562 Pain in left knee: Secondary | ICD-10-CM

## 2018-08-03 DIAGNOSIS — I10 Essential (primary) hypertension: Secondary | ICD-10-CM

## 2018-08-03 DIAGNOSIS — M25561 Pain in right knee: Secondary | ICD-10-CM

## 2018-08-03 DIAGNOSIS — E782 Mixed hyperlipidemia: Secondary | ICD-10-CM

## 2018-08-03 DIAGNOSIS — E1169 Type 2 diabetes mellitus with other specified complication: Secondary | ICD-10-CM | POA: Diagnosis not present

## 2018-08-03 DIAGNOSIS — L309 Dermatitis, unspecified: Secondary | ICD-10-CM | POA: Diagnosis not present

## 2018-08-03 DIAGNOSIS — M1A9XX0 Chronic gout, unspecified, without tophus (tophi): Secondary | ICD-10-CM | POA: Diagnosis not present

## 2018-08-03 LAB — LIPID PANEL
Cholesterol: 136 mg/dL (ref 0–200)
HDL: 35.2 mg/dL — ABNORMAL LOW (ref >39.00)
LDL Cholesterol: 76 mg/dL (ref 0–99)
NonHDL: 100.76
Total CHOL/HDL Ratio: 4
Triglycerides: 124 mg/dL (ref 0.0–149.0)
VLDL: 24.8 mg/dL (ref 0.0–40.0)

## 2018-08-03 LAB — COMPREHENSIVE METABOLIC PANEL
ALT: 11 U/L (ref 0–53)
AST: 16 U/L (ref 0–37)
Albumin: 4.5 g/dL (ref 3.5–5.2)
Alkaline Phosphatase: 38 U/L — ABNORMAL LOW (ref 39–117)
BUN: 41 mg/dL — ABNORMAL HIGH (ref 6–23)
CO2: 26 mEq/L (ref 19–32)
Calcium: 10.1 mg/dL (ref 8.4–10.5)
Chloride: 104 mEq/L (ref 96–112)
Creatinine, Ser: 2.02 mg/dL — ABNORMAL HIGH (ref 0.40–1.50)
GFR: 40.24 mL/min — ABNORMAL LOW (ref >60.00)
Glucose, Bld: 81 mg/dL (ref 70–99)
Potassium: 4.6 mEq/L (ref 3.5–5.1)
Sodium: 139 mEq/L (ref 135–145)
Total Bilirubin: 0.6 mg/dL (ref 0.2–1.2)
Total Protein: 7.6 g/dL (ref 6.0–8.3)

## 2018-08-03 LAB — CBC
HCT: 45.7 % (ref 39.0–52.0)
Hemoglobin: 14.7 g/dL (ref 13.0–17.0)
MCHC: 32.1 g/dL (ref 30.0–36.0)
MCV: 84.2 fl (ref 78.0–100.0)
Platelets: 219 10*3/uL (ref 150.0–400.0)
RBC: 5.42 Mil/uL (ref 4.22–5.81)
RDW: 18.2 % — ABNORMAL HIGH (ref 11.5–15.5)
WBC: 4 10*3/uL (ref 4.0–10.5)

## 2018-08-03 LAB — URIC ACID: Uric Acid, Serum: 4.9 mg/dL (ref 4.0–7.8)

## 2018-08-03 LAB — TSH: TSH: 1.17 u[IU]/mL (ref 0.35–4.50)

## 2018-08-03 LAB — HEMOGLOBIN A1C: Hgb A1c MFr Bld: 5.7 % (ref 4.6–6.5)

## 2018-08-03 MED ORDER — LORAZEPAM 1 MG PO TABS: 1.0000 mg | ORAL_TABLET | Freq: Three times a day (TID) | ORAL | 3 refills | Status: DC | PRN

## 2018-08-03 MED ORDER — HYDROCODONE-ACETAMINOPHEN 10-325 MG PO TABS: 1.0000 | ORAL_TABLET | Freq: Three times a day (TID) | ORAL | 0 refills | Status: AC | PRN

## 2018-08-03 MED ORDER — HYDROCODONE-ACETAMINOPHEN 10-325 MG PO TABS: 1.0000 | ORAL_TABLET | Freq: Three times a day (TID) | ORAL | 0 refills | Status: DC | PRN

## 2018-08-03 MED ORDER — BENAZEPRIL HCL 5 MG PO TABS: 2.5000 mg | ORAL_TABLET | Freq: Every day | ORAL | 3 refills | Status: DC

## 2018-08-03 NOTE — Patient Instructions (Addendum)
Prevnar is the pneumonia shot  Diabetes Mellitus and Nutrition, Adult When you have diabetes (diabetes mellitus), it is very important to have healthy eating habits because your blood sugar (glucose) levels are greatly affected by what you eat and drink. Eating healthy foods in the appropriate amounts, at about the same times every day, can help you:  Control your blood glucose.  Lower your risk of heart disease.  Improve your blood pressure.  Reach or maintain a healthy weight. Every person with diabetes is different, and each person has different needs for a meal plan. Your health care provider may recommend that you work with a diet and nutrition specialist (dietitian) to make a meal plan that is best for you. Your meal plan may vary depending on factors such as:  The calories you need.  The medicines you take.  Your weight.  Your blood glucose, blood pressure, and cholesterol levels.  Your activity level.  Other health conditions you have, such as heart or kidney disease. How do carbohydrates affect me? Carbohydrates, also called carbs, affect your blood glucose level more than any other type of food. Eating carbs naturally raises the amount of glucose in your blood. Carb counting is a method for keeping track of how many carbs you eat. Counting carbs is important to keep your blood glucose at a healthy level, especially if you use insulin or take certain oral diabetes medicines. It is important to know how many carbs you can safely have in each meal. This is different for every person. Your dietitian can help you calculate how many carbs you should have at each meal and for each snack. Foods that contain carbs include:  Bread, cereal, rice, pasta, and crackers.  Potatoes and corn.  Peas, beans, and lentils.  Milk and yogurt.  Fruit and juice.  Desserts, such as cakes, cookies, ice cream, and candy. How does alcohol affect me? Alcohol can cause a sudden decrease in  blood glucose (hypoglycemia), especially if you use insulin or take certain oral diabetes medicines. Hypoglycemia can be a life-threatening condition. Symptoms of hypoglycemia (sleepiness, dizziness, and confusion) are similar to symptoms of having too much alcohol. If your health care provider says that alcohol is safe for you, follow these guidelines:  Limit alcohol intake to no more than 1 drink per day for nonpregnant women and 2 drinks per day for men. One drink equals 12 oz of beer, 5 oz of wine, or 1 oz of hard liquor.  Do not drink on an empty stomach.  Keep yourself hydrated with water, diet soda, or unsweetened iced tea.  Keep in mind that regular soda, juice, and other mixers may contain a lot of sugar and must be counted as carbs. What are tips for following this plan?  Reading food labels  Start by checking the serving size on the "Nutrition Facts" label of packaged foods and drinks. The amount of calories, carbs, fats, and other nutrients listed on the label is based on one serving of the item. Many items contain more than one serving per package.  Check the total grams (g) of carbs in one serving. You can calculate the number of servings of carbs in one serving by dividing the total carbs by 15. For example, if a food has 30 g of total carbs, it would be equal to 2 servings of carbs.  Check the number of grams (g) of saturated and trans fats in one serving. Choose foods that have low or no amount of these  fats.  Check the number of milligrams (mg) of salt (sodium) in one serving. Most people should limit total sodium intake to less than 2,300 mg per day.  Always check the nutrition information of foods labeled as "low-fat" or "nonfat". These foods may be higher in added sugar or refined carbs and should be avoided.  Talk to your dietitian to identify your daily goals for nutrients listed on the label. Shopping  Avoid buying canned, premade, or processed foods. These foods  tend to be high in fat, sodium, and added sugar.  Shop around the outside edge of the grocery store. This includes fresh fruits and vegetables, bulk grains, fresh meats, and fresh dairy. Cooking  Use low-heat cooking methods, such as baking, instead of high-heat cooking methods like deep frying.  Cook using healthy oils, such as olive, canola, or sunflower oil.  Avoid cooking with butter, cream, or high-fat meats. Meal planning  Eat meals and snacks regularly, preferably at the same times every day. Avoid going long periods of time without eating.  Eat foods high in fiber, such as fresh fruits, vegetables, beans, and whole grains. Talk to your dietitian about how many servings of carbs you can eat at each meal.  Eat 4-6 ounces (oz) of lean protein each day, such as lean meat, chicken, fish, eggs, or tofu. One oz of lean protein is equal to: ? 1 oz of meat, chicken, or fish. ? 1 egg. ?  cup of tofu.  Eat some foods each day that contain healthy fats, such as avocado, nuts, seeds, and fish. Lifestyle  Check your blood glucose regularly.  Exercise regularly as told by your health care provider. This may include: ? 150 minutes of moderate-intensity or vigorous-intensity exercise each week. This could be brisk walking, biking, or water aerobics. ? Stretching and doing strength exercises, such as yoga or weightlifting, at least 2 times a week.  Take medicines as told by your health care provider.  Do not use any products that contain nicotine or tobacco, such as cigarettes and e-cigarettes. If you need help quitting, ask your health care provider.  Work with a Social worker or diabetes educator to identify strategies to manage stress and any emotional and social challenges. Questions to ask a health care provider  Do I need to meet with a diabetes educator?  Do I need to meet with a dietitian?  What number can I call if I have questions?  When are the best times to check my blood  glucose? Where to find more information:  American Diabetes Association: diabetes.org  Academy of Nutrition and Dietetics: www.eatright.[employer] of Diabetes and Digestive and Kidney Diseases (NIH): [employer] Summary  A healthy meal plan will help you control your blood glucose and maintain a healthy lifestyle.  Working with a diet and nutrition specialist (dietitian) can help you make a meal plan that is best for you.  Keep in mind that carbohydrates (carbs) and alcohol have immediate effects on your blood glucose levels. It is important to count carbs and to use alcohol carefully. This information is not intended to replace advice given to you by your health care provider. Make sure you discuss any questions you have with your health care provider. Document Released: 02/18/2005 Document Revised: 12/22/2016 Document Reviewed: 06/28/2016 Elsevier Interactive Patient Education  2019 [employer].

## 2018-08-03 NOTE — Assessment & Plan Note (Signed)
Is seeing Dermatology and they have given him hydrocortisone cream to use for 2 weeks and see if that helps. Try witch Hazel Artringent to cleanse face and they apply the hydrocortisone.

## 2018-08-03 NOTE — Assessment & Plan Note (Addendum)
Rate controlled and doing well. They have dropped his dose of Tikosyn in 1/2.

## 2018-08-03 NOTE — Assessment & Plan Note (Signed)
Increase leafy greens, consider increased lean red meat and using cast iron cookware. Continue to monitor, report any concerns 

## 2018-08-03 NOTE — Assessment & Plan Note (Signed)
Well controlled, no changes to meds. Encouraged heart healthy diet such as the DASH diet and exercise as tolerated.  °

## 2018-08-03 NOTE — Assessment & Plan Note (Signed)
Hydrate and monitor 

## 2018-08-03 NOTE — Assessment & Plan Note (Signed)
Encouraged heart healthy diet, increase exercise, avoid trans fats, consider a krill oil cap daily 

## 2018-08-03 NOTE — Assessment & Plan Note (Signed)
hgba1c acceptable, minimize simple carbs. Increase exercise as tolerated. Continue current meds 

## 2018-08-05 NOTE — Progress Notes (Signed)
Subjective:    Patient ID: [name], male    DOB: 1951-03-11, 68 y.o.   MRN: [id number]  No chief complaint on file.   HPI Patient is in today for follow up and overall he is doing well. No recent febrile illness or hospitalizations. No polyuria or polydipsia. Has had his Tikosyn dropped in 1/2 recently and tolerated that well. Denies CP/palp/SOB/HA/congestion/fevers/GI or GU c/o. Taking meds as prescribed. No recent gout flare.   Past Medical History:  Diagnosis Date  . AORTIC STENOSIS, MODERATE 12/05/2009  . Atrial fibrillation ([place])   . Atrial fibrillation with RVR ([place]) 07/05/2017  . Atrial flutter ([place]) 12/05/2009  . Atypical chest pain 11/08/2011  . BENIGN PROSTATIC HYPERTROPHY, HX OF 12/05/2009  . CHEST PAIN-UNSPECIFIED 11/11/2009  . CHF 12/05/2009  . Chronic renal insufficiency 12/16/2016  . Debility 04/18/2012  . Diarrhea 12/16/2016  . DYSPNEA 12/19/2009  . Edema 04/18/2012  . ERECTILE DYSFUNCTION 01/25/2007  . FATIGUE, CHRONIC 11/11/2009  . Gout 07/10/2012  . Heart murmur   . History of kidney stones    "they passed" (07/05/2017)  . Hyperkalemia 04/18/2012  . Hyperlipidemia   . Hypertension   . HYPERTENSION 01/25/2007  . Hypoxia 05/05/2011  . Insomnia 05/13/2016  . INSOMNIA, HX OF 01/25/2007  . Knee pain, bilateral 12/28/2010  . Knee pain, right 12/28/2010  . NEUROMA 01/05/2010  . OBESITY NOS 01/25/2007  . OSA on CPAP 04/06/2010  . PERIPHERAL NEUROPATHY 01/05/2010  . PULMONARY FUNCTION TESTS, ABNORMAL 02/02/2010  . Superficial thrombophlebitis of left leg 09/07/2011  . TESTICULAR HYPOFUNCTION 01/05/2010  . 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 Toe pain, right 07/10/2012  . Type II diabetes mellitus ([place]) 12/05/2009  . UNSPECIFIED ANEMIA 01/05/2010    Past Surgical History:  Procedure Laterality Date  . CARDIAC CATHETERIZATION  10/16/2009   nonischemic cardiomyopathy  . CARDIOVERSION N/A 03/25/2015   Procedure: CARDIOVERSION;  Surgeon: Pixie Casino, MD;  Location: [facility] ENDOSCOPY;  Service:  Cardiovascular;  Laterality: N/A;  . CARDIOVERSION N/A 06/17/2017   Procedure: CARDIOVERSION;  Surgeon: Sanda Klein, MD;  Location: [facility] ENDOSCOPY;  Service: Cardiovascular;  Laterality: N/A;  . CARDIOVERSION N/A 07/08/2017   Procedure: CARDIOVERSION;  Surgeon: Fay Records, MD;  Location[facility] ENDOSCOPY;  Service: Cardiovascular;  Laterality: N/A;  . METATARSAL OSTEOTOMY Bilateral ~ 1980   removed part of 5th metatarsal to corect curvature of toes     Family History  Problem Relation Age of Onset  . Clotting disorder Mother   . Heart disease Mother        s/p MI  . Heart attack Mother   . Hypertension Mother   . Diabetes Mother   . Hyperlipidemia Mother   . Stroke Mother   . Cancer Father        ? lung  . Lung disease Father        smoker  . Diabetes Sister   . Hypertension Sister        smoker  . Leukemia Maternal Grandmother        ?  [name] Aneurysm Sister        brain  . Other Sister        clipped  . Seizures Sister        d/o w/aneurysm/ smoker    Social History   Socioeconomic History  . Marital status: Significant Other    Spouse name: Not on file  . Number of children: 4  . Years of education: 53  . Highest education  level: High school graduate  Occupational History  . Occupation: retired    Fish farm manager: [employer]  Social Needs  . Financial resource strain: Not on file  . Food insecurity:    Worry: Not on file    Inability: Not on file  . Transportation needs:    Medical: Not on file    Non-medical: Not on file  Tobacco Use  . Smoking status: Former Smoker    Packs/day: 1.00    Years: 10.00    Pack years: 10.00    Last attempt to quit: 06/07/1980    Years since quitting: 38.1  . Smokeless tobacco: Never Used  Substance and Sexual Activity  . Alcohol use: No  . Drug use: No  . Sexual activity: Not Currently  Lifestyle  . Physical activity:    Days per week: Not on file    Minutes per session: Not on file  . Stress: Not on file  Relationships  .  Social connections:    Talks on phone: Not on file    Gets together: Not on file    Attends religious service: Not on file    Active member of club or organization: Not on file    Attends meetings of clubs or organizations: Not on file    Relationship status: Not on file  . Intimate partner violence:    Fear of current or ex partner: Not on file    Emotionally abused: Not on file    Physically abused: Not on file    Forced sexual activity: Not on file  Other Topics Concern  . Not on file  Social History Narrative   Lives with male partner in a one story home.  His son lives there off and on.  Retired from [employer].  Education: high school.      Outpatient Medications Prior to Visit  Medication Sig Dispense Refill  . albuterol (VENTOLIN HFA) 108 (90 Base) MCG/ACT inhaler Inhale 2 puffs into the lungs every 6 (six) hours as needed for wheezing or shortness of breath. 1 Inhaler 6  . allopurinol (ZYLOPRIM) 300 MG tablet TAKE 1 TABLET BY MOUTH ONCE DAILY 90 tablet 0  . calcium carbonate (TUMS - DOSED IN MG ELEMENTAL CALCIUM) 500 MG chewable tablet Chew 1 tablet by mouth daily as needed. For heartburn    . cetirizine (ZYRTEC) 10 MG tablet Take 1 tablet (10 mg total) by mouth daily. 30 tablet 11  . Clobetasol Prop Emollient Base (CLOBETASOL PROPIONATE E) 0.05 % emollient cream Apply 1 application topically 2 (two) times daily as needed (insect bite). 60 g 1  . clotrimazole-betamethasone (LOTRISONE) cream Apply 1 application topically 2 (two) times daily. 30 g 1  . colchicine (COLCRYS) 0.6 MG tablet TAKE ONE TABLET BY MOUTH DAILY AS NEEDED FOR GOUT FLARE UPS. 30 tablet 0  . dofetilide (TIKOSYN) 250 MCG capsule Take 1 capsule (250 mcg total) by mouth 2 (two) times daily. 60 capsule 10  . fish oil-omega-3 fatty acids 1000 MG capsule Take 1 g by mouth daily.     . furosemide (LASIX) 40 MG tablet TAKE TWO TABLETS BY MOUTH EVERY MORNING AND TAKE ONE TABLET BY MOUTH EVERY AFTERNOON 270 tablet 3  .  glyBURIDE (DIABETA) 5 MG tablet TAKE 1 TABLET BY MOUTH ONCE DAILY WITH BREAKFAST 90 tablet 0  . latanoprost (XALATAN) 0.005 % ophthalmic solution INSTILL 1 DROP INTO EACH EYE ONCE DAILY IN THE EVENING  5  . magnesium oxide (MAG-OX) 400 MG tablet Take 1  tablet (400 mg total) by mouth daily. 30 tablet 11  . metFORMIN (GLUCOPHAGE) 500 MG tablet TAKE 1 TABLET BY MOUTH THREE TIMES DAILY 270 tablet 0  . metoprolol succinate (TOPROL-XL) 100 MG 24 hr tablet TAKE 1 TABLET BY MOUTH TWICE DAILY WITH OR IMMEDIATELY FOLLOWING A MEAL. 180 tablet 2  . Multiple Vitamins-Minerals (MULTIVITAMIN PO) Take 1 tablet by mouth daily.    . potassium chloride SA (K-DUR,KLOR-CON) 20 MEQ tablet TAKE 1 TABLET BY MOUTH ONCE DAILY AND  EXTRA  TABLET  IF  WEIGHT  IS  ABOVE  210  LBS 180 tablet 2  . pravastatin (PRAVACHOL) 40 MG tablet TAKE 1 TABLET BY MOUTH ONCE DAILY 90 tablet 3  . temazepam (RESTORIL) 30 MG capsule TAKE 1 CAPSULE BY MOUTH AT BEDTIME AS NEEDED FOR SLEEP 30 capsule 5  . Testosterone 10 MG/ACT (2%) GEL Apply 1 application topically daily. Use as directed    . warfarin (COUMADIN) 4 MG tablet TAKE 1/2 TO 1 (ONE-HALF TO ONE) TABLET BY MOUTH DAILY OR AS DIRECTED BY COUMADIN CLINIC 90 tablet 1  . benazepril (LOTENSIN) 10 MG tablet Take 0.5 tablets (5 mg total) by mouth daily.  3  . HYDROcodone-acetaminophen (NORCO) 10-325 MG tablet Take 1 tablet by mouth every 8 (eight) hours as needed for moderate pain or severe pain. 90 tablet 0  . LORazepam (ATIVAN) 1 MG tablet Take 1 tablet (1 mg total) by mouth every 8 (eight) hours as needed for anxiety. 30 tablet 3   No facility-administered medications prior to visit.     Allergies  Allergen Reactions  . Sulfonamide Derivatives Other (See Comments)    unknown reaction.  Patient states had to go to the ER.   [name] Zolpidem Other (See Comments)    Excessive, prolonged sedation    ROS     Objective:    Physical Exam  BP 108/70 (BP Location: Left Arm, Patient Position:  Sitting, Cuff Size: Normal)   Pulse 81   Temp 97.6 F (36.4 C) (Oral)   Resp 18   Wt 209 lb 9.6 oz (95.1 kg)   SpO2 96%   BMI 29.23 kg/m  Wt Readings from Last 3 Encounters:  08/03/18 209 lb 9.6 oz (95.1 kg)  07/25/18 209 lb 9.6 oz (95.1 kg)  06/05/18 209 lb 3.2 oz (94.9 kg)     Lab Results  Component Value Date   WBC 4.0 08/03/2018   HGB 14.7 08/03/2018   HCT 45.7 08/03/2018   PLT 219.0 08/03/2018   GLUCOSE 81 08/03/2018   CHOL 136 08/03/2018   TRIG 124.0 08/03/2018   HDL 35.20 (L) 08/03/2018   LDLDIRECT 84.0 05/02/2018   LDLCALC 76 08/03/2018   ALT 11 08/03/2018   AST 16 08/03/2018   NA 139 08/03/2018   K 4.6 08/03/2018   CL 104 08/03/2018   CREATININE 2.02 (H) 08/03/2018   BUN 41 (H) 08/03/2018   CO2 26 08/03/2018   TSH 1.17 08/03/2018   PSA 10.21 (H) 05/02/2018   INR 2.5 07/25/2018   HGBA1C 5.7 08/03/2018   MICROALBUR 1.1 04/14/2015    Lab Results  Component Value Date   TSH 1.17 08/03/2018   Lab Results  Component Value Date   WBC 4.0 08/03/2018   HGB 14.7 08/03/2018   HCT 45.7 08/03/2018   MCV 84.2 08/03/2018   PLT 219.0 08/03/2018   Lab Results  Component Value Date   NA 139 08/03/2018   K 4.6 08/03/2018   CO2 26 08/03/2018  GLUCOSE 81 08/03/2018   BUN 41 (H) 08/03/2018   CREATININE 2.02 (H) 08/03/2018   BILITOT 0.6 08/03/2018   ALKPHOS 38 (L) 08/03/2018   AST 16 08/03/2018   ALT 11 08/03/2018   PROT 7.6 08/03/2018   ALBUMIN 4.5 08/03/2018   CALCIUM 10.1 08/03/2018   ANIONGAP 11 07/08/2017   GFR 40.24 (L) 08/03/2018   Lab Results  Component Value Date   CHOL 136 08/03/2018   Lab Results  Component Value Date   HDL 35.20 (L) 08/03/2018   Lab Results  Component Value Date   LDLCALC 76 08/03/2018   Lab Results  Component Value Date   TRIG 124.0 08/03/2018   Lab Results  Component Value Date   CHOLHDL 4 08/03/2018   Lab Results  Component Value Date   HGBA1C 5.7 08/03/2018       Assessment & Plan:   Problem List  Items Addressed This Visit    Diabetes mellitus type 2 in obese ([place])    hgba1c acceptable, minimize simple carbs. Increase exercise as tolerated. Continue current meds      Relevant Medications   benazepril (LOTENSIN) 5 MG tablet   Other Relevant Orders   Hemoglobin A1c (Completed)   Anemia    Increase leafy greens, consider increased lean red meat and using cast iron cookware. Continue to monitor, report any concerns      Hyperlipidemia    Encouraged heart healthy diet, increase exercise, avoid trans fats, consider a krill oil cap daily      Relevant Medications   benazepril (LOTENSIN) 5 MG tablet   Other Relevant Orders   Lipid panel (Completed)   Knee pain, bilateral   Relevant Medications   HYDROcodone-acetaminophen (NORCO) 10-325 MG tablet   Gout    Hydrate and monitor       Relevant Orders   Uric acid (Completed)   Eczema    Is seeing Dermatology and they have given him hydrocortisone cream to use for 2 weeks and see if that helps. Try witch Hazel Artringent to cleanse face and they apply the hydrocortisone.       Essential hypertension, malignant    Well controlled, no changes to meds. Encouraged heart healthy diet such as the DASH diet and exercise as tolerated.       Relevant Medications   benazepril (LOTENSIN) 5 MG tablet   Other Relevant Orders   CBC (Completed)   Comprehensive metabolic panel (Completed)   TSH (Completed)   Atrial flutter ([place])    Rate controlled and doing well. They have dropped his dose of Tikosyn in 1/2.       Relevant Medications   benazepril (LOTENSIN) 5 MG tablet    Other Visit Diagnoses    High risk medication use    -  Primary   Relevant Orders   Pain Mgmt, Profile 8 w/Conf, U      I have discontinued Isaah H. Schlichting's benazepril. I am also having him start on benazepril. Additionally, I am having him maintain his fish oil-omega-3 fatty acids, calcium carbonate, Multiple Vitamins-Minerals (MULTIVITAMIN PO),  Testosterone, cetirizine, magnesium oxide, colchicine, albuterol, Clobetasol Prop Emollient Base, furosemide, pravastatin, clotrimazole-betamethasone, temazepam, warfarin, latanoprost, potassium chloride SA, allopurinol, metoprolol succinate, metFORMIN, glyBURIDE, dofetilide, LORazepam, and HYDROcodone-acetaminophen.  Meds ordered this encounter  Medications  . LORazepam (ATIVAN) 1 MG tablet    Sig: Take 1 tablet (1 mg total) by mouth every 8 (eight) hours as needed for anxiety.    Dispense:  30 tablet  Refill:  3  . DISCONTD: HYDROcodone-acetaminophen (NORCO) 10-325 MG tablet    Sig: Take 1 tablet by mouth every 8 (eight) hours as needed for moderate pain or severe pain.    Dispense:  90 tablet    Refill:  0  . benazepril (LOTENSIN) 5 MG tablet    Sig: Take 0.5 tablets (2.5 mg total) by mouth daily.    Dispense:  15 tablet    Refill:  3  . HYDROcodone-acetaminophen (NORCO) 10-325 MG tablet    Sig: Take 1 tablet by mouth every 8 (eight) hours as needed for moderate pain or severe pain.    Dispense:  90 tablet    Refill:  0     Penni Homans, MD

## 2018-08-06 LAB — PAIN MGMT, PROFILE 8 W/CONF, U
6 Acetylmorphine: NEGATIVE ng/mL (ref <10)
ALCOHOL METABOLITES: NEGATIVE ng/mL (ref <500)
AMPHETAMINES: NEGATIVE ng/mL (ref <500)
Alphahydroxyalprazolam: NEGATIVE ng/mL (ref <25)
Alphahydroxymidazolam: NEGATIVE ng/mL (ref <50)
Alphahydroxytriazolam: NEGATIVE ng/mL (ref <50)
Aminoclonazepam: NEGATIVE ng/mL (ref <25)
Benzodiazepines: POSITIVE ng/mL — AB (ref <100)
Buprenorphine, Urine: NEGATIVE ng/mL (ref <5)
Cocaine Metabolite: NEGATIVE ng/mL (ref <150)
Creatinine: 253.7 mg/dL
Hydroxyethylflurazepam: NEGATIVE ng/mL (ref <50)
Lorazepam: 817 ng/mL — ABNORMAL HIGH (ref <50)
MDMA: NEGATIVE ng/mL (ref <500)
Marijuana Metabolite: NEGATIVE ng/mL (ref <20)
NORDIAZEPAM: NEGATIVE ng/mL (ref <50)
Opiates: NEGATIVE ng/mL (ref <100)
Oxazepam: 1974 ng/mL — ABNORMAL HIGH (ref <50)
Oxidant: NEGATIVE ug/mL (ref <200)
Oxycodone: NEGATIVE ng/mL (ref <100)
pH: 5.68 (ref 4.5–9.0)

## 2018-08-15 ENCOUNTER — Encounter: Payer: Self-pay | Admitting: Adult Health

## 2018-08-15 ENCOUNTER — Ambulatory Visit (INDEPENDENT_AMBULATORY_CARE_PROVIDER_SITE_OTHER): Payer: Medicare Other | Admitting: Adult Health

## 2018-08-15 DIAGNOSIS — E6609 Other obesity due to excess calories: Secondary | ICD-10-CM

## 2018-08-15 DIAGNOSIS — G4733 Obstructive sleep apnea (adult) (pediatric): Secondary | ICD-10-CM

## 2018-08-15 DIAGNOSIS — Z6830 Body mass index (BMI) 30.0-30.9, adult: Secondary | ICD-10-CM | POA: Diagnosis not present

## 2018-08-15 NOTE — Patient Instructions (Signed)
Continue on CPAP At bedtime   Goal is at least 6 hrs each night.  Do not drive if sleepy.  Work on healthy weight.  Follow up with Dr. Elsworth Soho  In 1 year and As needed

## 2018-08-15 NOTE — Assessment & Plan Note (Signed)
Well controlled and excellent compliance   Plan  Patient Instructions  Continue on CPAP At bedtime   Goal is at least 6 hrs each night.  Do not drive if sleepy.  Work on healthy weight.  Follow up with Dr. Elsworth Soho  In 1 year and As needed

## 2018-08-15 NOTE — Assessment & Plan Note (Signed)
Healthy weight loss 

## 2018-08-15 NOTE — Progress Notes (Signed)
@Patient  ID: [name], male    DOB: January 22, 1951, 68 y.o.   MRN: [id number]  Chief Complaint  Patient presents with  . Follow-up    OSA    Referring provider: [name], MD  HPI: 68 year old followed for obstructive sleep apnea and insomnia Medical history significant for A. fib and nonischemic cardiomyopathy with a EF of 40%  TEST/EVENTS :  PSG dec '11>> - weighed 225 pounds,BMI of 31, neck size of 14 inches Sleep was very fragmented due to frequent arousals and awakenings, especially in the second half of the night with CPAP. He was desensitized with a standard nasal mask. Note that he took 2 tablets of temazepam of 15 mg prior to the study. >>AHI was 42/h with lowest desatn of 86 % >>corrected by CPAP of 8 cm with a nasal mask. Download 12/28 -07/02/10 >>good compliance avg 6.5h, AHI 15/h, centrals 9/h, obstructive 4.5/h, avg pr 8 cm  Download on 10 cm 2/28-3/28/12 >>few residual events, some central apneas persist   PFT ->01/2010 - TLC 77%, DLCO 56%, FVC 3.5L/72%, RAtio 77 ,FEV 1 78%   08/15/2018 Follow up : OSA  Patient presents for a 11-month follow-up.  Patient has underlying severe sleep apnea.  He is on CPAP at bedtime. Patient recently got a new CPAP machine.  Says it is working well.  He did adjust his CPAP pressures to auto set 8 to 12 cm H2O.  Patient says it is very comfortable.  He did try a full facemask.  But says that it did not work as well.  He likes his nasal mask.  Download shows excellent compliance at 100% with daily average usage at 8 hours.  AHI 7.0. Patient says he feels rested and feels he benefits from CPAP.  Says he cannot go a night without having at all.  Occasionally has some restless sleep.  Patient has some chronic rhinitis.  Uses Zyrtec as needed.  This is under control currently      Allergies  Allergen Reactions  . Sulfonamide Derivatives Other (See Comments)    unknown reaction.  Patient states had to go to the ER.   [name]  Zolpidem Other (See Comments)    Excessive, prolonged sedation    Immunization History  Administered Date(s) Administered  . Influenza Split 03/03/2011, 03/10/2012, 04/07/2018  . Influenza Whole 03/28/2008, 03/30/2010  . Influenza,inj,Quad PF,6+ Mos 01/31/2013, 03/07/2014, 04/14/2015, 02/19/2016, 03/18/2017  . Pneumococcal Polysaccharide-23 10/07/2009  . Td 09/05/2009    Past Medical History:  Diagnosis Date  . AORTIC STENOSIS, MODERATE 12/05/2009  . Atrial fibrillation ([place])   . Atrial fibrillation with RVR ([place]) 07/05/2017  . Atrial flutter ([place]) 12/05/2009  . Atypical chest pain 11/08/2011  . BENIGN PROSTATIC HYPERTROPHY, HX OF 12/05/2009  . CHEST PAIN-UNSPECIFIED 11/11/2009  . CHF 12/05/2009  . Chronic renal insufficiency 12/16/2016  . Debility 04/18/2012  . Diarrhea 12/16/2016  . DYSPNEA 12/19/2009  . Edema 04/18/2012  . ERECTILE DYSFUNCTION 01/25/2007  . FATIGUE, CHRONIC 11/11/2009  . Gout 07/10/2012  . Heart murmur   . History of kidney stones    "they passed" (07/05/2017)  . Hyperkalemia 04/18/2012  . Hyperlipidemia   . Hypertension   . HYPERTENSION 01/25/2007  . Hypoxia 05/05/2011  . Insomnia 05/13/2016  . INSOMNIA, HX OF 01/25/2007  . Knee pain, bilateral 12/28/2010  . Knee pain, right 12/28/2010  . NEUROMA 01/05/2010  . OBESITY NOS 01/25/2007  . OSA on CPAP 04/06/2010  . PERIPHERAL NEUROPATHY 01/05/2010  . PULMONARY  FUNCTION TESTS, ABNORMAL 02/02/2010  . Superficial thrombophlebitis of left leg 09/07/2011  . TESTICULAR HYPOFUNCTION 01/05/2010  . 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 Toe pain, right 07/10/2012  . Type II diabetes mellitus ([place]) 12/05/2009  . UNSPECIFIED ANEMIA 01/05/2010    Tobacco History: Social History   Tobacco Use  Smoking Status Former Smoker  . Packs/day: 1.00  . Years: 10.00  . Pack years: 10.00  . Last attempt to quit: 06/07/1980  . Years since quitting: 38.2  Smokeless Tobacco Never Used   Counseling given: Not Answered   Outpatient Medications Prior to Visit    Medication Sig Dispense Refill  . albuterol (VENTOLIN HFA) 108 (90 Base) MCG/ACT inhaler Inhale 2 puffs into the lungs every 6 (six) hours as needed for wheezing or shortness of breath. 1 Inhaler 6  . allopurinol (ZYLOPRIM) 300 MG tablet TAKE 1 TABLET BY MOUTH ONCE DAILY 90 tablet 0  . benazepril (LOTENSIN) 5 MG tablet Take 0.5 tablets (2.5 mg total) by mouth daily. 15 tablet 3  . calcium carbonate (TUMS - DOSED IN MG ELEMENTAL CALCIUM) 500 MG chewable tablet Chew 1 tablet by mouth daily as needed. For heartburn    . cetirizine (ZYRTEC) 10 MG tablet Take 1 tablet (10 mg total) by mouth daily. 30 tablet 11  . Clobetasol Prop Emollient Base (CLOBETASOL PROPIONATE E) 0.05 % emollient cream Apply 1 application topically 2 (two) times daily as needed (insect bite). 60 g 1  . clotrimazole-betamethasone (LOTRISONE) cream Apply 1 application topically 2 (two) times daily. 30 g 1  . colchicine (COLCRYS) 0.6 MG tablet TAKE ONE TABLET BY MOUTH DAILY AS NEEDED FOR GOUT FLARE UPS. 30 tablet 0  . dofetilide (TIKOSYN) 250 MCG capsule Take 1 capsule (250 mcg total) by mouth 2 (two) times daily. 60 capsule 10  . fish oil-omega-3 fatty acids 1000 MG capsule Take 1 g by mouth daily.     . furosemide (LASIX) 40 MG tablet TAKE TWO TABLETS BY MOUTH EVERY MORNING AND TAKE ONE TABLET BY MOUTH EVERY AFTERNOON 270 tablet 3  . glyBURIDE (DIABETA) 5 MG tablet TAKE 1 TABLET BY MOUTH ONCE DAILY WITH BREAKFAST 90 tablet 0  . HYDROcodone-acetaminophen (NORCO) 10-325 MG tablet Take 1 tablet by mouth every 8 (eight) hours as needed for moderate pain or severe pain. 90 tablet 0  . LORazepam (ATIVAN) 1 MG tablet Take 1 tablet (1 mg total) by mouth every 8 (eight) hours as needed for anxiety. 30 tablet 3  . magnesium oxide (MAG-OX) 400 MG tablet Take 1 tablet (400 mg total) by mouth daily. 30 tablet 11  . metFORMIN (GLUCOPHAGE) 500 MG tablet TAKE 1 TABLET BY MOUTH THREE TIMES DAILY 270 tablet 0  . metoprolol succinate (TOPROL-XL) 100  MG 24 hr tablet TAKE 1 TABLET BY MOUTH TWICE DAILY WITH OR IMMEDIATELY FOLLOWING A MEAL. 180 tablet 2  . Multiple Vitamins-Minerals (MULTIVITAMIN PO) Take 1 tablet by mouth daily.    . potassium chloride SA (K-DUR,KLOR-CON) 20 MEQ tablet TAKE 1 TABLET BY MOUTH ONCE DAILY AND  EXTRA  TABLET  IF  WEIGHT  IS  ABOVE  210  LBS 180 tablet 2  . pravastatin (PRAVACHOL) 40 MG tablet TAKE 1 TABLET BY MOUTH ONCE DAILY 90 tablet 3  . temazepam (RESTORIL) 30 MG capsule TAKE 1 CAPSULE BY MOUTH AT BEDTIME AS NEEDED FOR SLEEP 30 capsule 5  . Testosterone 10 MG/ACT (2%) GEL Apply 1 application topically daily. Use as directed    . warfarin (COUMADIN) 4 MG  tablet TAKE 1/2 TO 1 (ONE-HALF TO ONE) TABLET BY MOUTH DAILY OR AS DIRECTED BY COUMADIN CLINIC 90 tablet 1  . latanoprost (XALATAN) 0.005 % ophthalmic solution INSTILL 1 DROP INTO EACH EYE ONCE DAILY IN THE EVENING  5   No facility-administered medications prior to visit.      Review of Systems:   Constitutional:   No  weight loss, night sweats,  Fevers, chills, fatigue, or  lassitude.  HEENT:   No headaches,  Difficulty swallowing,  Tooth/dental problems, or  Sore throat,                No sneezing, itching, ear ache, nasal congestion, post nasal drip,   CV:  No chest pain,  Orthopnea, PND, swelling in lower extremities, anasarca, dizziness, palpitations, syncope.   GI  No heartburn, indigestion, abdominal pain, nausea, vomiting, diarrhea, change in bowel habits, loss of appetite, bloody stools.   Resp: No shortness of breath with exertion or at rest.  No excess mucus, no productive cough,  No non-productive cough,  No coughing up of blood.  No change in color of mucus.  No wheezing.  No chest wall deformity  Skin: no rash or lesions.  GU: no dysuria, change in color of urine, no urgency or frequency.  No flank pain, no hematuria   MS:  No joint pain or swelling.  No decreased range of motion.  No back pain.    Physical Exam  BP 118/70 (BP  Location: Left Arm, Cuff Size: Normal)   Pulse 87   Ht 5\' 11"  (1.803 m)   Wt 209 lb (94.8 kg)   SpO2 98%   BMI 29.15 kg/m   GEN: A/Ox3; pleasant , NAD, well nourished    HEENT:  [place]/AT,  EACs-clear, TMs-wnl, NOSE-clear, THROAT-clear, no lesions, no postnasal drip or exudate noted. Class 2-3 MP airway   NECK:  Supple w/ fair ROM; no JVD; normal carotid impulses w/o bruits; no thyromegaly or nodules palpated; no lymphadenopathy.    RESP  Clear  P & A; w/o, wheezes/ rales/ or rhonchi. no accessory muscle use, no dullness to percussion  CARD:  RRR, no m/r/g, no peripheral edema, pulses intact, no cyanosis or clubbing.  GI:   Soft & nt; nml bowel sounds; no organomegaly or masses detected.   Musco: Warm bil, no deformities or joint swelling noted.   Neuro: alert, no focal deficits noted.    Skin: Warm, no lesions or rashes    Lab Results:  CBC  BNP No results found for: BNP   Imaging: No results found.    No flowsheet data found.  No results found for: NITRICOXIDE      Assessment & Plan:   OSA (obstructive sleep apnea) Well controlled and excellent compliance   Plan  Patient Instructions  Continue on CPAP At bedtime   Goal is at least 6 hrs each night.  Do not drive if sleepy.  Work on healthy weight.  Follow up with Dr. Elsworth Soho  In 1 year and As needed        Obesity Healthy weight loss.      Rexene Edison, NP 08/15/2018

## 2018-08-16 DIAGNOSIS — E876 Hypokalemia: Secondary | ICD-10-CM | POA: Diagnosis not present

## 2018-08-16 DIAGNOSIS — N2889 Other specified disorders of kidney and ureter: Secondary | ICD-10-CM | POA: Diagnosis not present

## 2018-08-16 DIAGNOSIS — E1122 Type 2 diabetes mellitus with diabetic chronic kidney disease: Secondary | ICD-10-CM | POA: Diagnosis not present

## 2018-08-16 DIAGNOSIS — I129 Hypertensive chronic kidney disease with stage 1 through stage 4 chronic kidney disease, or unspecified chronic kidney disease: Secondary | ICD-10-CM | POA: Diagnosis not present

## 2018-08-16 DIAGNOSIS — M109 Gout, unspecified: Secondary | ICD-10-CM | POA: Diagnosis not present

## 2018-08-16 DIAGNOSIS — N183 Chronic kidney disease, stage 3 (moderate): Secondary | ICD-10-CM | POA: Diagnosis not present

## 2018-08-19 ENCOUNTER — Other Ambulatory Visit: Payer: Self-pay | Admitting: Family Medicine

## 2018-08-21 DIAGNOSIS — N183 Chronic kidney disease, stage 3 (moderate): Secondary | ICD-10-CM | POA: Diagnosis not present

## 2018-08-21 DIAGNOSIS — E876 Hypokalemia: Secondary | ICD-10-CM | POA: Diagnosis not present

## 2018-08-21 DIAGNOSIS — I129 Hypertensive chronic kidney disease with stage 1 through stage 4 chronic kidney disease, or unspecified chronic kidney disease: Secondary | ICD-10-CM | POA: Diagnosis not present

## 2018-08-21 DIAGNOSIS — E1122 Type 2 diabetes mellitus with diabetic chronic kidney disease: Secondary | ICD-10-CM | POA: Diagnosis not present

## 2018-08-21 DIAGNOSIS — M109 Gout, unspecified: Secondary | ICD-10-CM | POA: Diagnosis not present

## 2018-08-21 NOTE — Telephone Encounter (Signed)
Requesting:restoril  Contract:yes UDS:low risk next screen 02/01/2019 Last OV:08/03/2018 Next OV:11/02/18 Last Refill:01/24/18  #30-5rf Database:   Please advise

## 2018-08-28 ENCOUNTER — Other Ambulatory Visit: Payer: Self-pay | Admitting: Internal Medicine

## 2018-08-28 ENCOUNTER — Other Ambulatory Visit: Payer: Self-pay | Admitting: Cardiology

## 2018-09-04 ENCOUNTER — Telehealth: Payer: Self-pay | Admitting: Pharmacist Clinician (PhC)/ Clinical Pharmacy Specialist

## 2018-09-04 ENCOUNTER — Other Ambulatory Visit: Payer: Self-pay | Admitting: Pharmacist Clinician (PhC)/ Clinical Pharmacy Specialist

## 2018-09-04 MED ORDER — APIXABAN 5 MG PO TABS: 5.0000 mg | ORAL_TABLET | Freq: Two times a day (BID) | ORAL | 5 refills | Status: DC

## 2018-09-04 NOTE — Telephone Encounter (Signed)

## 2018-09-04 NOTE — Telephone Encounter (Signed)
rx sent to pharmacy to determine if cost prohibitive or not

## 2018-09-06 ENCOUNTER — Ambulatory Visit (INDEPENDENT_AMBULATORY_CARE_PROVIDER_SITE_OTHER): Payer: Medicare Other | Admitting: Pharmacist

## 2018-09-06 ENCOUNTER — Other Ambulatory Visit: Payer: Self-pay

## 2018-09-06 DIAGNOSIS — Z7901 Long term (current) use of anticoagulants: Secondary | ICD-10-CM

## 2018-09-06 DIAGNOSIS — I480 Paroxysmal atrial fibrillation: Secondary | ICD-10-CM | POA: Diagnosis not present

## 2018-09-06 LAB — POCT INR: INR: 2.2 (ref 2.0–3.0)

## 2018-09-25 ENCOUNTER — Other Ambulatory Visit: Payer: Self-pay | Admitting: Family Medicine

## 2018-09-25 DIAGNOSIS — E1169 Type 2 diabetes mellitus with other specified complication: Secondary | ICD-10-CM

## 2018-09-25 DIAGNOSIS — E669 Obesity, unspecified: Secondary | ICD-10-CM

## 2018-09-25 DIAGNOSIS — D649 Anemia, unspecified: Secondary | ICD-10-CM

## 2018-09-25 DIAGNOSIS — E785 Hyperlipidemia, unspecified: Secondary | ICD-10-CM

## 2018-09-25 DIAGNOSIS — H409 Unspecified glaucoma: Secondary | ICD-10-CM

## 2018-09-25 DIAGNOSIS — F411 Generalized anxiety disorder: Secondary | ICD-10-CM

## 2018-09-25 DIAGNOSIS — E875 Hyperkalemia: Secondary | ICD-10-CM

## 2018-09-25 DIAGNOSIS — G4700 Insomnia, unspecified: Secondary | ICD-10-CM

## 2018-09-25 DIAGNOSIS — M25562 Pain in left knee: Secondary | ICD-10-CM

## 2018-09-25 DIAGNOSIS — M25561 Pain in right knee: Secondary | ICD-10-CM

## 2018-09-25 DIAGNOSIS — Z7901 Long term (current) use of anticoagulants: Secondary | ICD-10-CM

## 2018-09-25 DIAGNOSIS — I10 Essential (primary) hypertension: Secondary | ICD-10-CM

## 2018-09-25 NOTE — Telephone Encounter (Signed)
Requesting:Restoril  Contract:yes UDS:low risk next screen 02/01/19 Last OV: 08/03/18 Next OV:11/02/18 Last Refill:08/21/18  #30-0rf Database:   Please advise

## 2018-10-09 ENCOUNTER — Ambulatory Visit: Payer: Medicare Other

## 2018-10-19 ENCOUNTER — Telehealth: Payer: Self-pay | Admitting: Adult Health

## 2018-10-19 NOTE — Telephone Encounter (Signed)
Please get CPAP download so we can review

## 2018-10-19 NOTE — Telephone Encounter (Signed)
Called and spoke with Patient.  Patient stated he received replacement cpap, feels he is sleeping good, but he checked his download, and noticed he is having anywhere from 1 to 30 events at night.  Patient is concerned cpap is not working properly, needs adjusting, or if it is related to his sleeping medications. Patient doesn't feel this is normal and would like Tammy, NP to see download or make recommendations. Patient is in [place] with compliant download.  Message routed to [place], NP to advise on cpap events

## 2018-10-19 NOTE — Telephone Encounter (Signed)
Download from air view printed and given to [place].  Per Tammy, NP, patient needs OV or video visist with App Friday or he can wait for OV with Tammy on Monday. Patient does have my chart, but he does not have smart phone or video capability.  Patient did not want to come to office, because of his age, and Covid pandemic.  Offered tele visit.  Patient agreed, but only wants it with Tammy,NP.  Tele visit scheduled with Tammy, NP, 10/23/18, at 1030.  Televisit explained and understanding stated.   Nothing further at this time.

## 2018-10-23 ENCOUNTER — Other Ambulatory Visit: Payer: Self-pay

## 2018-10-23 ENCOUNTER — Encounter: Payer: Self-pay | Admitting: Adult Health

## 2018-10-23 ENCOUNTER — Ambulatory Visit (INDEPENDENT_AMBULATORY_CARE_PROVIDER_SITE_OTHER): Payer: Medicare Other | Admitting: Adult Health

## 2018-10-23 DIAGNOSIS — G4733 Obstructive sleep apnea (adult) (pediatric): Secondary | ICD-10-CM

## 2018-10-23 NOTE — Progress Notes (Signed)
Virtual Visit via Telephone Note  I connected with [name] on 10/23/18 at 10:30 AM EDT by telephone and verified that I am speaking with the correct person using two identifiers.  Location: Patient: Home  Provider: Office    I discussed the limitations, risks, security and privacy concerns of performing an evaluation and management service by telephone and the availability of in person appointments. I also discussed with the patient that there may be a patient responsible charge related to this service. The patient expressed understanding and agreed to proceed.   History of Present Illness: 68 year old male followed for obstructive sleep apnea and insomnia Medical history significant for A. fib, nonischemic cardiomyopathy with an EF at 40%  Today tele-visit is a follow-up for sleep apnea.  Patient has underlying severe sleep apnea.  He is recently got a new CPAP machine.  Feels that it is working okay however he has noticed that he is having increased sleep apnea events.  He gets a daily readout about his sleep apnea control and he has noticed that he is having significantly increased events.  Patient says he has not noticed any increased daytime symptoms.  He has no increased daytime sleepiness.  Says he goes to bed every night he never misses a night with his CPAP.  He wears it for at least 7 to 8 hours each night.  Download shows excellent compliance with 100% usage.  Daily average use is at 8 hours.  Patient is on CPAP AutoSet 8 to 12 cm H2O.  AHI 14.9.  Minimum leaks.  Daily average pressure at 10.  Patient did have increased central events at 8/hour.  Also changing Stokes respirations was noted.  Patient does have underlying congestive heart failure.  He denies any new medications.  Says he rarely takes any pain medications.  He does take temazepam at nighttime.  But is been on this chronically.    Observations/Objective: PSG dec '11>> - weighed 225 pounds,BMI of 31, neck size of 14  inches Sleep was very fragmented due to frequent arousals and awakenings, especially in the second half of the night with CPAP. He was desensitized with a standard nasal mask. Note that he took 2 tablets of temazepam of 15 mg prior to the study. >>AHI was 42/h with lowest desatn of 86 % (no centrals noted on original study  >>corrected by CPAP of 8 cm with a nasal mask. Download 12/28 -07/02/10 >>good compliance avg 6.5h, AHI 15/h, centrals 9/h, obstructive 4.5/h, avg pr 8 cm  Download on 10 cm 2/28-3/28/12 >>few residual events, some central apneas persist   PFT ->01/2010 - TLC 77%, DLCO 56%, FVC 3.5L/72%, RAtio 77 ,FEV 1 78%   Assessment and Plan: OSA -needs improved control , his compliance is excellent.  . It appears this is emergent central sleep apnea as this was not present on original study . He does have underlying CHF so this is contributing factor. He is asymptomatic and central events remain relatively low (~8)  We will need to adjust CPAP pressure to see if this helps to decrease in central events. Change from autoset to set pressure at 10cmH20   CPAP download in 3 weeks.  Plan  Patient Instructions  Change CPAP pressure to 10 cm H2O CPAP Download in 3-4 weeks and As needed   Continue on CPAP At bedtime   Goal is at least 6 hrs each night.  Do not drive if sleepy.  Work on healthy weight.  Follow up with Dr.  Elsworth Soho or Tad Fancher NP in 3 months and As needed above       Follow Up Instructions: Follow up with Dr. Elsworth Soho  In 3 months and As needed      I discussed the assessment and treatment plan with the patient. The patient was provided an opportunity to ask questions and all were answered. The patient agreed with the plan and demonstrated an understanding of the instructions.   The patient was advised to call back or seek an in-person evaluation if the symptoms worsen or if the condition fails to improve as anticipated.  I provided 22  minutes of non-face-to-face time during  this encounter.   Rexene Edison, NP

## 2018-10-23 NOTE — Patient Instructions (Addendum)
Change CPAP pressure to 10 cm H2O CPAP Download in 3-4 weeks and As needed   Continue on CPAP At bedtime   Goal is at least 6 hrs each night.  Do not drive if sleepy.  Work on healthy weight.  Follow up with Dr. Elsworth Soho or Opal Dinning NP in 3 months and As needed above

## 2018-11-01 ENCOUNTER — Other Ambulatory Visit: Payer: Self-pay | Admitting: Family Medicine

## 2018-11-01 NOTE — Telephone Encounter (Signed)
Requesting:temazepam  Contract:yes UDS: low risk next screen 02/01/19 Last OV:08/03/18 Next OV:11/02/18 Last Refill:09/26/18  #30-0rf Database:   Please advise

## 2018-11-02 ENCOUNTER — Ambulatory Visit (INDEPENDENT_AMBULATORY_CARE_PROVIDER_SITE_OTHER): Payer: Medicare Other | Admitting: Family Medicine

## 2018-11-02 ENCOUNTER — Other Ambulatory Visit: Payer: Self-pay

## 2018-11-02 DIAGNOSIS — M1A9XX0 Chronic gout, unspecified, without tophus (tophi): Secondary | ICD-10-CM

## 2018-11-02 DIAGNOSIS — E1169 Type 2 diabetes mellitus with other specified complication: Secondary | ICD-10-CM

## 2018-11-02 DIAGNOSIS — I480 Paroxysmal atrial fibrillation: Secondary | ICD-10-CM

## 2018-11-02 DIAGNOSIS — I10 Essential (primary) hypertension: Secondary | ICD-10-CM

## 2018-11-02 DIAGNOSIS — E669 Obesity, unspecified: Secondary | ICD-10-CM

## 2018-11-02 NOTE — Assessment & Plan Note (Signed)
Hydrate and monitor 

## 2018-11-02 NOTE — Assessment & Plan Note (Signed)
Generally controlled his cardiologist recently dropped his TIkosyn from 500 to 250 daily

## 2018-11-02 NOTE — Assessment & Plan Note (Signed)
Well controlled, no changes to meds. Encouraged heart healthy diet such as the DASH diet and exercise as tolerated.  °

## 2018-11-05 NOTE — Assessment & Plan Note (Signed)
hgba1c acceptable, minimize simple carbs. Increase exercise as tolerated. Continue current meds 

## 2018-11-05 NOTE — Progress Notes (Signed)
Virtual Visit via Telephone Note  I connected with [name] on 11/02/18 at 11:00 AM EDT by telephone and verified that I am speaking with the correct person using two identifiers.  Location: Patient: home Provider: office   I discussed the limitations, risks, security and privacy concerns of performing an evaluation and management service by telephone and the availability of in person appointments. I also discussed with the patient that there may be a patient responsible charge related to this service. The patient expressed understanding and agreed to proceed. Magdalene Molly, CMA attempted to get patient set up on a video visit but was unsuccessful so visit was completed via phone     Subjective:    Patient ID: [name], male    DOB: 04-26-1951, 68 y.o.   MRN: [id number]  No chief complaint on file.   HPI Patient is in today for follow up on chronic medical concerns including gout, atrial fibrillation, hypertension, diabetes and more. He feels well today. Is staying in and maintaining social distancing. He denies any recent febrile illness or hospitalizations. No polyuria or polydipsia. Cardiology has recent dropped his dose of Tikosyn down from 500 mg to 250 mg. Denies CP/palp/SOB/HA/congestion/fevers/GI or GU c/o. Taking meds as prescribed  Past Medical History:  Diagnosis Date  . AORTIC STENOSIS, MODERATE 12/05/2009  . Atrial fibrillation ([place])   . Atrial fibrillation with RVR ([place]) 07/05/2017  . Atrial flutter ([place]) 12/05/2009  . Atypical chest pain 11/08/2011  . BENIGN PROSTATIC HYPERTROPHY, HX OF 12/05/2009  . CHEST PAIN-UNSPECIFIED 11/11/2009  . CHF 12/05/2009  . Chronic renal insufficiency 12/16/2016  . Debility 04/18/2012  . Diarrhea 12/16/2016  . DYSPNEA 12/19/2009  . Edema 04/18/2012  . ERECTILE DYSFUNCTION 01/25/2007  . FATIGUE, CHRONIC 11/11/2009  . Gout 07/10/2012  . Heart murmur   . History of kidney stones    "they passed" (07/05/2017)  . Hyperkalemia 04/18/2012  .  Hyperlipidemia   . Hypertension   . HYPERTENSION 01/25/2007  . Hypoxia 05/05/2011  . Insomnia 05/13/2016  . INSOMNIA, HX OF 01/25/2007  . Knee pain, bilateral 12/28/2010  . Knee pain, right 12/28/2010  . NEUROMA 01/05/2010  . OBESITY NOS 01/25/2007  . OSA on CPAP 04/06/2010  . PERIPHERAL NEUROPATHY 01/05/2010  . PULMONARY FUNCTION TESTS, ABNORMAL 02/02/2010  . Superficial thrombophlebitis of left leg 09/07/2011  . TESTICULAR HYPOFUNCTION 01/05/2010  . 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 Toe pain, right 07/10/2012  . Type II diabetes mellitus ([place]) 12/05/2009  . UNSPECIFIED ANEMIA 01/05/2010    Past Surgical History:  Procedure Laterality Date  . CARDIAC CATHETERIZATION  10/16/2009   nonischemic cardiomyopathy  . CARDIOVERSION N/A 03/25/2015   Procedure: CARDIOVERSION;  Surgeon: Pixie Casino, MD;  Location: [facility] ENDOSCOPY;  Service: Cardiovascular;  Laterality: N/A;  . CARDIOVERSION N/A 06/17/2017   Procedure: CARDIOVERSION;  Surgeon: Sanda Klein, MD;  Location: [facility] ENDOSCOPY;  Service: Cardiovascular;  Laterality: N/A;  . CARDIOVERSION N/A 07/08/2017   Procedure: CARDIOVERSION;  Surgeon: Fay Records, MD;  Location: [facility] ENDOSCOPY;  Service: Cardiovascular;  Laterality: N/A;  . METATARSAL OSTEOTOMY Bilateral ~ 1980   removed part of 5th metatarsal to corect curvature of toes     Family History  Problem Relation Age of Onset  . Clotting disorder Mother   . Heart disease Mother        s/p MI  . Heart attack Mother   . Hypertension Mother   . Diabetes Mother   . Hyperlipidemia Mother   . Stroke Mother   .  Cancer Father        ? lung  . Lung disease Father        smoker  . Diabetes Sister   . Hypertension Sister        smoker  . Leukemia Maternal Grandmother        ?  [name] Aneurysm Sister        brain  . Other Sister        clipped  . Seizures Sister        d/o w/aneurysm/ smoker    Social History   Socioeconomic History  . Marital status: Significant Other    Spouse name: Not on file   . Number of children: 4  . Years of education: 45  . Highest education level: High school graduate  Occupational History  . Occupation: retired    Fish farm manager: [employer]  Social Needs  . Financial resource strain: Not on file  . Food insecurity:    Worry: Not on file    Inability: Not on file  . Transportation needs:    Medical: Not on file    Non-medical: Not on file  Tobacco Use  . Smoking status: Former Smoker    Packs/day: 1.00    Years: 10.00    Pack years: 10.00    Last attempt to quit: 06/07/1980    Years since quitting: 38.4  . Smokeless tobacco: Never Used  Substance and Sexual Activity  . Alcohol use: No  . Drug use: No  . Sexual activity: Not Currently  Lifestyle  . Physical activity:    Days per week: Not on file    Minutes per session: Not on file  . Stress: Not on file  Relationships  . Social connections:    Talks on phone: Not on file    Gets together: Not on file    Attends religious service: Not on file    Active member of club or organization: Not on file    Attends meetings of clubs or organizations: Not on file    Relationship status: Not on file  . Intimate partner violence:    Fear of current or ex partner: Not on file    Emotionally abused: Not on file    Physically abused: Not on file    Forced sexual activity: Not on file  Other Topics Concern  . Not on file  Social History Narrative   Lives with male partner in a one story home.  His son lives there off and on.  Retired from [employer].  Education: high school.      Outpatient Medications Prior to Visit  Medication Sig Dispense Refill  . albuterol (VENTOLIN HFA) 108 (90 Base) MCG/ACT inhaler Inhale 2 puffs into the lungs every 6 (six) hours as needed for wheezing or shortness of breath. 1 Inhaler 6  . allopurinol (ZYLOPRIM) 300 MG tablet Take 1 tablet by mouth once daily 90 tablet 0  . apixaban (ELIQUIS) 5 MG TABS tablet Take 1 tablet (5 mg total) by mouth 2 (two) times daily. 60 tablet 5   . benazepril (LOTENSIN) 5 MG tablet Take 0.5 tablets (2.5 mg total) by mouth daily. 15 tablet 3  . calcium carbonate (TUMS - DOSED IN MG ELEMENTAL CALCIUM) 500 MG chewable tablet Chew 1 tablet by mouth daily as needed. For heartburn    . cetirizine (ZYRTEC) 10 MG tablet Take 1 tablet (10 mg total) by mouth daily. 30 tablet 11  . Clobetasol Prop Emollient Base (CLOBETASOL PROPIONATE  E) 0.05 % emollient cream Apply 1 application topically 2 (two) times daily as needed (insect bite). 60 g 1  . clotrimazole-betamethasone (LOTRISONE) cream Apply 1 application topically 2 (two) times daily. 30 g 1  . colchicine (COLCRYS) 0.6 MG tablet TAKE ONE TABLET BY MOUTH DAILY AS NEEDED FOR GOUT FLARE UPS. 30 tablet 0  . dofetilide (TIKOSYN) 250 MCG capsule Take 1 capsule (250 mcg total) by mouth 2 (two) times daily. 60 capsule 10  . fish oil-omega-3 fatty acids 1000 MG capsule Take 1 g by mouth daily.     . furosemide (LASIX) 40 MG tablet TAKE 2 TABLETS BY MOUTH EVERY MORNING AND TAKE 1 TABLET EVERY AFTERNOON. 270 tablet 1  . glyBURIDE (DIABETA) 5 MG tablet Take 1 tablet by mouth once daily with breakfast 90 tablet 0  . HYDROcodone-acetaminophen (NORCO) 10-325 MG tablet Take 1 tablet by mouth every 8 (eight) hours as needed for moderate pain or severe pain. 90 tablet 0  . LORazepam (ATIVAN) 1 MG tablet Take 1 tablet (1 mg total) by mouth every 8 (eight) hours as needed for anxiety. 30 tablet 3  . magnesium oxide (MAG-OX) 400 MG tablet Take 1 tablet (400 mg total) by mouth daily. 30 tablet 11  . metFORMIN (GLUCOPHAGE) 500 MG tablet TAKE 1 TABLET BY MOUTH THREE TIMES DAILY 270 tablet 0  . metoprolol succinate (TOPROL-XL) 100 MG 24 hr tablet TAKE 1 TABLET BY MOUTH TWICE DAILY WITH OR IMMEDIATELY FOLLOWING A MEAL. 180 tablet 2  . Multiple Vitamins-Minerals (MULTIVITAMIN PO) Take 1 tablet by mouth daily.    . potassium chloride SA (K-DUR,KLOR-CON) 20 MEQ tablet TAKE 1 TABLET BY MOUTH ONCE DAILY AND  EXTRA  TABLET  IF   WEIGHT  IS  ABOVE  210  LBS 180 tablet 2  . pravastatin (PRAVACHOL) 40 MG tablet Take 1 tablet by mouth once daily 90 tablet 1  . temazepam (RESTORIL) 30 MG capsule TAKE 1 CAPSULE BY MOUTH AT BEDTIME AS NEEDED FOR SLEEP 30 capsule 0  . Testosterone 10 MG/ACT (2%) GEL Apply 1 application topically daily. Use as directed     No facility-administered medications prior to visit.     Allergies  Allergen Reactions  . Sulfonamide Derivatives Other (See Comments)    unknown reaction.  Patient states had to go to the ER.   [name] Zolpidem Other (See Comments)    Excessive, prolonged sedation    Review of Systems  Constitutional: Negative for fever and malaise/fatigue.  HENT: Negative for congestion.   Eyes: Negative for blurred vision.  Respiratory: Negative for shortness of breath.   Cardiovascular: Negative for chest pain, palpitations and leg swelling.  Gastrointestinal: Negative for abdominal pain, blood in stool and nausea.  Genitourinary: Negative for dysuria and frequency.  Musculoskeletal: Negative for falls.  Skin: Negative for rash.  Neurological: Negative for dizziness, loss of consciousness and headaches.  Endo/Heme/Allergies: Negative for environmental allergies.  Psychiatric/Behavioral: Negative for depression. The patient is not nervous/anxious.        Objective:    Physical Exam Unable to obtain via telephone  BP 113/78   Pulse (!) 102   Temp (!) 97.1 F (36.2 C)   Wt 192 lb (87.1 kg)   BMI 26.78 kg/m  Wt Readings from Last 3 Encounters:  11/02/18 192 lb (87.1 kg)  08/15/18 209 lb (94.8 kg)  08/03/18 209 lb 9.6 oz (95.1 kg)    Diabetic Foot Exam - Simple   No data filed  Lab Results  Component Value Date   WBC 4.0 08/03/2018   HGB 14.7 08/03/2018   HCT 45.7 08/03/2018   PLT 219.0 08/03/2018   GLUCOSE 81 08/03/2018   CHOL 136 08/03/2018   TRIG 124.0 08/03/2018   HDL 35.20 (L) 08/03/2018   LDLDIRECT 84.0 05/02/2018   LDLCALC 76 08/03/2018   ALT 11  08/03/2018   AST 16 08/03/2018   NA 139 08/03/2018   K 4.6 08/03/2018   CL 104 08/03/2018   CREATININE 2.02 (H) 08/03/2018   BUN 41 (H) 08/03/2018   CO2 26 08/03/2018   TSH 1.17 08/03/2018   PSA 10.21 (H) 05/02/2018   INR 2.2 09/06/2018   HGBA1C 5.7 08/03/2018   MICROALBUR 1.1 04/14/2015    Lab Results  Component Value Date   TSH 1.17 08/03/2018   Lab Results  Component Value Date   WBC 4.0 08/03/2018   HGB 14.7 08/03/2018   HCT 45.7 08/03/2018   MCV 84.2 08/03/2018   PLT 219.0 08/03/2018   Lab Results  Component Value Date   NA 139 08/03/2018   K 4.6 08/03/2018   CO2 26 08/03/2018   GLUCOSE 81 08/03/2018   BUN 41 (H) 08/03/2018   CREATININE 2.02 (H) 08/03/2018   BILITOT 0.6 08/03/2018   ALKPHOS 38 (L) 08/03/2018   AST 16 08/03/2018   ALT 11 08/03/2018   PROT 7.6 08/03/2018   ALBUMIN 4.5 08/03/2018   CALCIUM 10.1 08/03/2018   ANIONGAP 11 07/08/2017   GFR 40.24 (L) 08/03/2018   Lab Results  Component Value Date   CHOL 136 08/03/2018   Lab Results  Component Value Date   HDL 35.20 (L) 08/03/2018   Lab Results  Component Value Date   LDLCALC 76 08/03/2018   Lab Results  Component Value Date   TRIG 124.0 08/03/2018   Lab Results  Component Value Date   CHOLHDL 4 08/03/2018   Lab Results  Component Value Date   HGBA1C 5.7 08/03/2018       Assessment & Plan:   Problem List Items Addressed This Visit    Diabetes mellitus type 2 in obese ([place])    hgba1c acceptable, minimize simple carbs. Increase exercise as tolerated. Continue current meds.      PAF (paroxysmal atrial fibrillation) ([place])    Generally controlled his cardiologist recently dropped his TIkosyn from 500 to 250 daily      Gout    Hydrate and monitor      Essential hypertension, malignant    Well controlled, no changes to meds. Encouraged heart healthy diet such as the DASH diet and exercise as tolerated.          I am having [name] maintain his fish  oil-omega-3 fatty acids, calcium carbonate, Multiple Vitamins-Minerals (MULTIVITAMIN PO), Testosterone, cetirizine, magnesium oxide, colchicine, albuterol, Clobetasol Prop Emollient Base, clotrimazole-betamethasone, potassium chloride SA, metoprolol succinate, dofetilide, LORazepam, benazepril, HYDROcodone-acetaminophen, furosemide, pravastatin, apixaban, metFORMIN, allopurinol, glyBURIDE, and temazepam.  No orders of the defined types were placed in this encounter.   I discussed the assessment and treatment plan with the patient. The patient was provided an opportunity to ask questions and all were answered. The patient agreed with the plan and demonstrated an understanding of the instructions.   The patient was advised to call back or seek an in-person evaluation if the symptoms worsen or if the condition fails to improve as anticipated.  I provided 25 minutes of non-face-to-face time during this encounter.   Penni Homans, MD

## 2018-11-28 ENCOUNTER — Other Ambulatory Visit: Payer: Self-pay | Admitting: Family Medicine

## 2018-12-04 ENCOUNTER — Other Ambulatory Visit: Payer: Self-pay | Admitting: Family Medicine

## 2018-12-04 NOTE — Telephone Encounter (Signed)
Requesting:temazepam Contract:yes UDS:n/a Last OV:11/02/18 Next OV:02/05/19 Last Refill: 11/01/18  [id number] Database:   Please advise

## 2018-12-24 ENCOUNTER — Other Ambulatory Visit: Payer: Self-pay | Admitting: Family Medicine

## 2018-12-24 DIAGNOSIS — Z7901 Long term (current) use of anticoagulants: Secondary | ICD-10-CM

## 2018-12-24 DIAGNOSIS — I10 Essential (primary) hypertension: Secondary | ICD-10-CM

## 2018-12-24 DIAGNOSIS — M25561 Pain in right knee: Secondary | ICD-10-CM

## 2018-12-24 DIAGNOSIS — E785 Hyperlipidemia, unspecified: Secondary | ICD-10-CM

## 2018-12-24 DIAGNOSIS — H409 Unspecified glaucoma: Secondary | ICD-10-CM

## 2018-12-24 DIAGNOSIS — E1169 Type 2 diabetes mellitus with other specified complication: Secondary | ICD-10-CM

## 2018-12-24 DIAGNOSIS — D649 Anemia, unspecified: Secondary | ICD-10-CM

## 2018-12-24 DIAGNOSIS — F411 Generalized anxiety disorder: Secondary | ICD-10-CM

## 2018-12-24 DIAGNOSIS — M25562 Pain in left knee: Secondary | ICD-10-CM

## 2018-12-24 DIAGNOSIS — G4700 Insomnia, unspecified: Secondary | ICD-10-CM

## 2018-12-24 DIAGNOSIS — E875 Hyperkalemia: Secondary | ICD-10-CM

## 2019-01-21 IMAGING — MR MR ORBITS WO/W CM
15 of 18 series · 34 of 48 positions shown · IV contrast (multihance)
Comparison: No prior brain or orbit MRI studies are available for
correlation.

CLINICAL DATA: History of optic neuritis, decreased vision.

EXAM:
MRI HEAD AND ORBITS WITHOUT AND WITH CONTRAST
TECHNIQUE: Multiplanar, multiecho pulse sequences of the brain and surrounding
structures were obtained without and with intravenous contrast.
Multiplanar, multiecho pulse sequences of the orbits and surrounding
structures were obtained including fat saturation techniques, before
and after intravenous contrast administration.
CONTRAST:  20mL MULTIHANCE GADOBENATE DIMEGLUMINE 529 MG/ML IV SOLN

[Series 6: T1 · sagittal · 4.0mm · 0.75mm/px · 2 of 27 slices shown (1 of 4)]
[im 1/27]
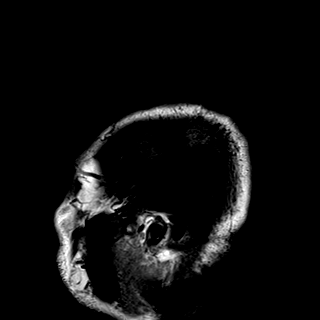
[im 27/27]
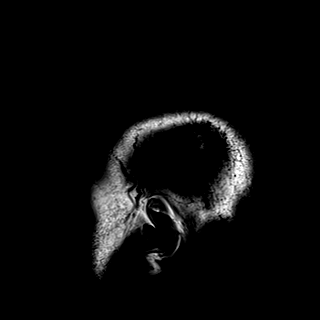

[Series 7: DWI · axial · 3.0mm · 1.44mm/px · z∈[-64,+75]mm · 4 of 80 slices shown (1 of 4)]
[im 1/80]
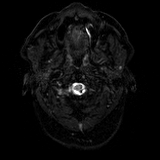
[im 27/80]
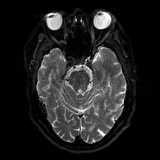
[im 53/80]
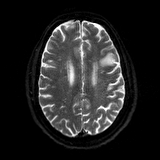
[im 80/80]
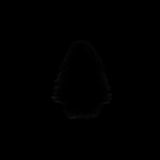

[Series 8: DWI · axial · 3.0mm · 1.44mm/px · z∈[-64,+75]mm · 2 of 40 slices shown (2 of 4)]
[im 1/40]
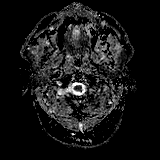
[im 40/40]
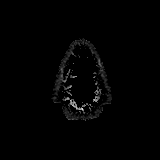

[Series 9: DWI · coronal · 5.0mm · 1.44mm/px · 4 of 72 slices shown (3 of 4)]
[im 1/72]
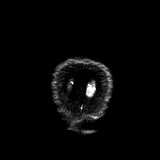
[im 24/72]
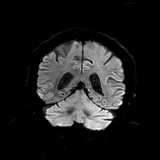
[im 48/72]
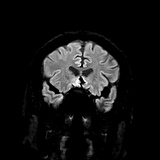
[im 72/72]
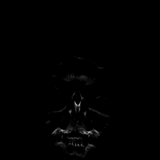

[Series 10: DWI · coronal · 5.0mm · 1.44mm/px · 2 of 36 slices shown (4 of 4)]
[im 1/36]
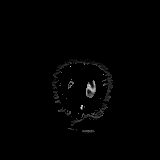
[im 36/36]
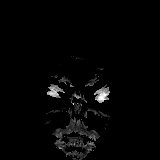

[Series 11: T2 · axial · 4.0mm · 0.36mm/px · 1 of 28 slices shown]
[im 1/28]
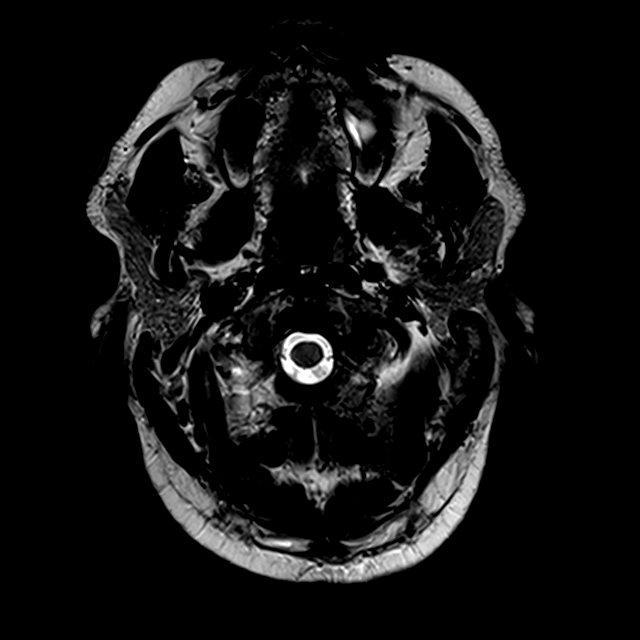

[Series 12: FLAIR · axial · 3.0mm · 0.72mm/px · 1 of 26 slices shown]
[im 1/26]
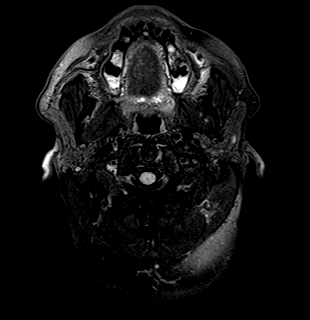

[Series 15: T1 · axial · 1.0mm · 0.90mm/px · z∈[-68,+74]mm · 7 of 144 slices shown (2 of 4)]
[im 1/144]
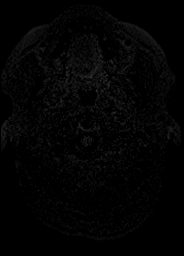
[im 24/144]
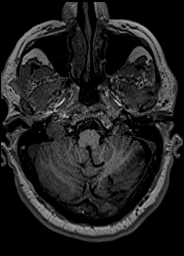
[im 48/144]
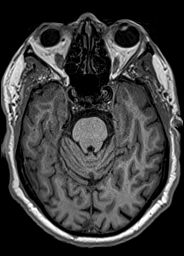
[im 72/144]
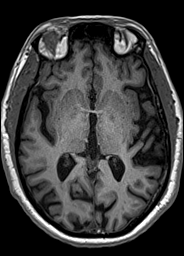
[im 96/144]
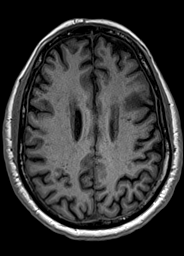
[im 120/144]
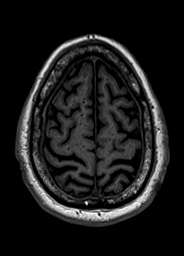
[im 144/144]
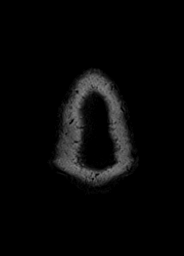

[Series 16: T2 fat-sat · axial · 2.5mm · 0.25mm/px · 1 of 19 slices shown (1 of 2)]
[im 1/19]
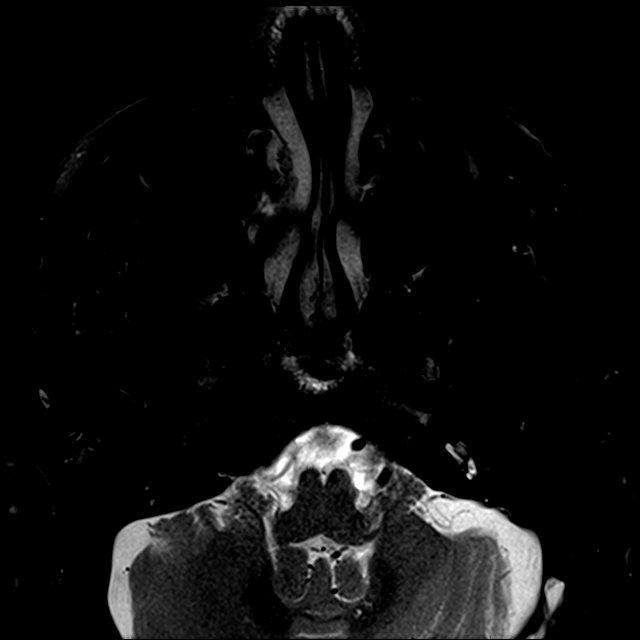

[Series 17: T2 fat-sat · coronal · 2.5mm · 0.25mm/px · 2 of 33 slices shown (2 of 2)]
[im 1/33]
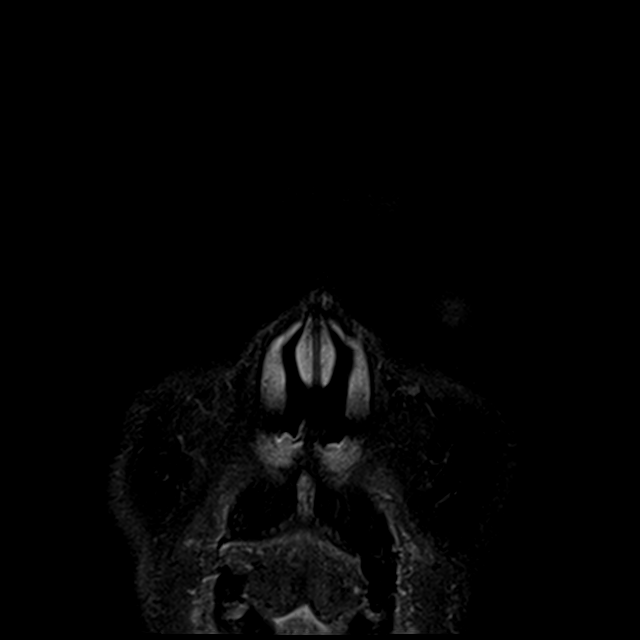
[im 33/33]
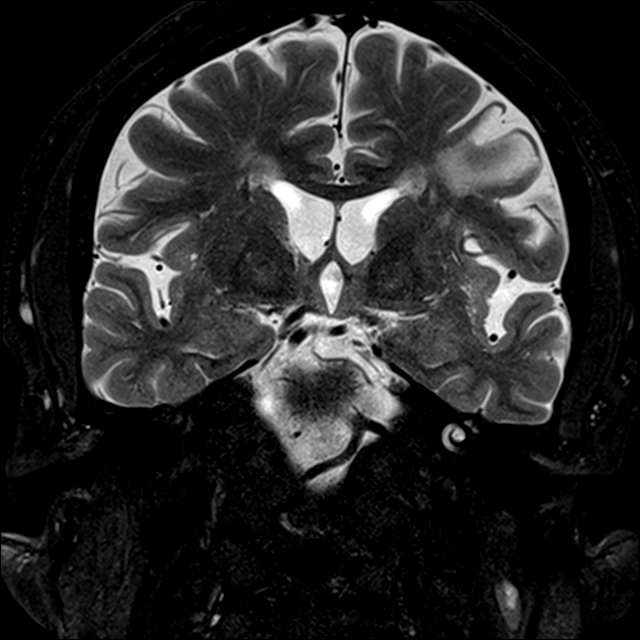

[Series 18: T1 · axial · 2.5mm · 0.25mm/px · 1 of 22 slices shown (3 of 4)]
[im 1/22]
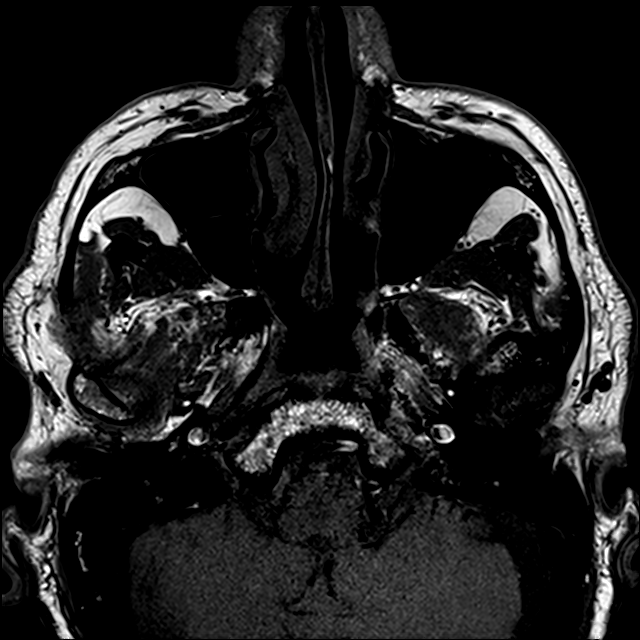

[Series 19: T1 · coronal · 2.5mm · 0.25mm/px · 1 of 35 slices shown (4 of 4)]
[im 1/35]
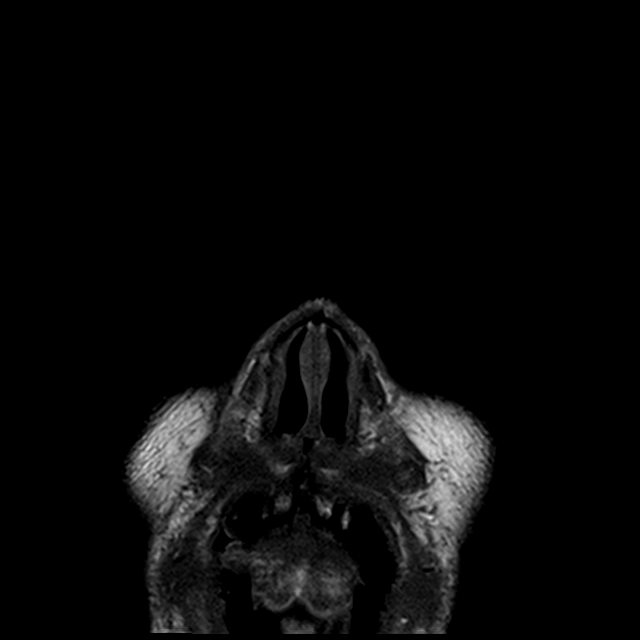

[Series 21: T1 post-contrast · coronal · 2.5mm · 0.50mm/px · 2 of 35 slices shown (1 of 2)]
[im 1/35]
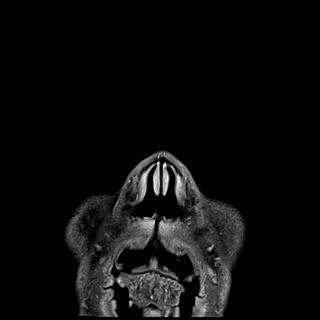
[im 35/35]
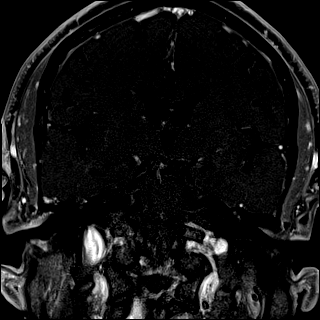

[Series 22: T2 post-contrast · coronal · 4.5mm · 0.36mm/px · 2 of 32 slices shown]
[im 1/32]
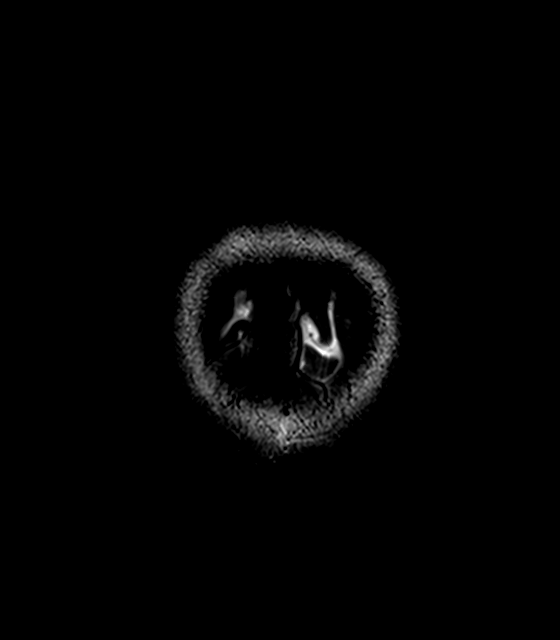
[im 32/32]
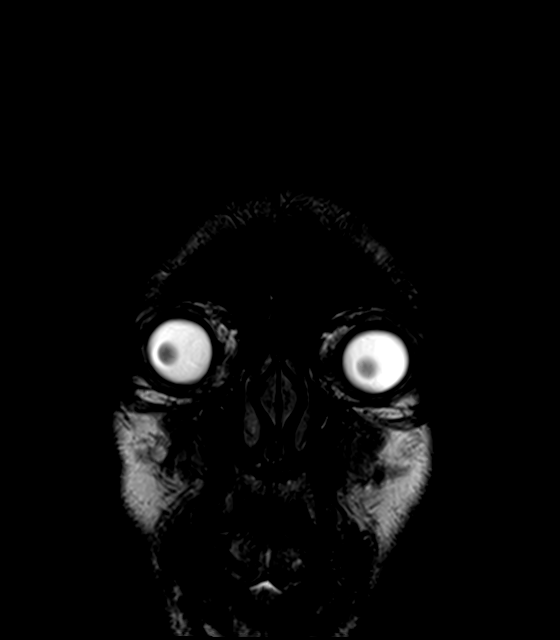

[Series 23: T1 post-contrast · coronal · 4.5mm · 0.90mm/px · 2 of 32 slices shown (2 of 2)]
[im 1/32]
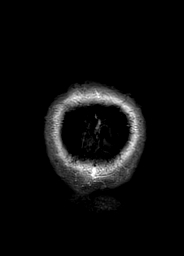
[im 32/32]
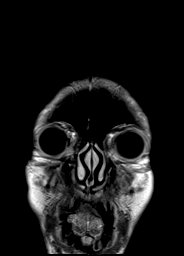

[34 of 48 positions shown; findings below may reference images not displayed]

FINDINGS: MRI HEAD FINDINGS

Brain: No evidence for acute infarction, hemorrhage, mass lesion,
hydrocephalus, or extra-axial fluid. Premature for age cerebral and
cerebellar atrophy. Mild to moderate subcortical and periventricular
T2 and FLAIR hyperintensities, are nonspecific.

There is a moderate-sized area of large vessel cortical and
subcortical infarction affecting the LEFT frontal convexity with
gliosis and encephalomalacia, and there are scattered lacunar
infarcts throughout the brain, most notably in the RIGHT caudate.
Given those 2 findings of clear-cut remote cerebral ischemia, the
white matter signal changes are favored to represent small vessel
disease, rather than a demyelinating process.

Prominent perivascular spaces are noted in addition to at least 1
small focus of chronic hemorrhage in the LEFT frontal subcortical
white matter. These findings suggest hypertension may be responsible
for the suspected chronic microvascular ischemic change.

Post infusion, no abnormal enhancement of the brain or meninges.

Vascular: Normal flow voids.

Skull and upper cervical spine: Normal marrow signal.

Other: None.

MRI ORBITS FINDINGS

Orbits: No traumatic or inflammatory finding. Globes, orbital fat,
extraocular muscles, vascular structures, and lacrimal glands are
normal.

The LEFT optic nerve is small. There is T2 hyperintensity associated
with its proximal and mid portions. The RIGHT optic nerve appears
normal. There is no abnormal postcontrast enhancement of either
nerve.

Visualized sinuses: Clear.

Soft tissues: Negative.
IMPRESSION: Cerebral and cerebellar atrophy. Changes of chronic cerebral
ischemia including a LEFT frontal large vessel infarct and a
prominent RIGHT basal ganglia lacune.

Subcortical and periventricular white matter signal abnormality,
favored to represent chronic microvascular ischemic change of a
hypertensive origin, rather than chronic demyelinating disease.

No acute intracranial findings. No abnormal postcontrast
enhancement.

Small LEFT optic nerve, favored to represent ischemic optic atrophy.
No acute orbital findings are evident.

## 2019-01-23 ENCOUNTER — Telehealth: Payer: Self-pay | Admitting: Cardiovascular Disease

## 2019-01-23 ENCOUNTER — Telehealth: Payer: Self-pay | Admitting: Family Medicine

## 2019-01-23 DIAGNOSIS — I480 Paroxysmal atrial fibrillation: Secondary | ICD-10-CM

## 2019-01-23 NOTE — Telephone Encounter (Signed)
New message   STAT if HR is under 50 or over 120 (normal HR is 60-100 beats per minute)  1) What is your heart rate?107   2) Do you have a log of your heart rate readings (document readings)? Yes   3) Do you have any other symptoms? No, patient thinks this increase in hr  may be coming from dofetilide (TIKOSYN) 250 MCG capsule. Please call to discuss.

## 2019-01-23 NOTE — Telephone Encounter (Signed)
Did not see test strips ever written for him.  If you are ok with it, how many times a day would you like?

## 2019-01-23 NOTE — Telephone Encounter (Signed)
Returned the call to the patient. He stated that the Tikosyn was decreased to 250 mcg in February from 500 mcg. Since the change the patient has noticed that his pulse has gone up.   The patient checks his pulse every Sunday before he takes his medication. June 8th-107 June 28th-91 July 10th-100 July 19th-115 July 26th- 121, BP 100/69 August 2nd-107 August 9th -109  He stated that before the decrease in the Tikosyn his heart rate was 65-70. He stated that he feels fine but was concerned about the increase in his heart rate. His weight is currently between 191-193

## 2019-01-23 NOTE — Telephone Encounter (Signed)
Yes OK to write strips check sugars daily and prn. Disp #100 with 5 rf

## 2019-01-23 NOTE — Telephone Encounter (Signed)
Pt need prescription called in for his test strips heneed Verio  Send to [facility]

## 2019-01-24 ENCOUNTER — Telehealth: Payer: Self-pay | Admitting: *Deleted

## 2019-01-24 MED ORDER — GLUCOSE BLOOD VI STRP: ORAL_STRIP | 12 refills | Status: DC

## 2019-01-24 NOTE — Telephone Encounter (Signed)
3 day ZIO XT long term holter monitor to be mailed to patients home.  Instructions reviewed briefly as they are included in the monitor kit. 

## 2019-01-24 NOTE — Telephone Encounter (Signed)
Test strips sent to pharmacy.

## 2019-01-24 NOTE — Telephone Encounter (Signed)
The patient has agreed to wear the monitor. The order has been placed.

## 2019-01-24 NOTE — Telephone Encounter (Signed)
We need to see if the increased heart rate is due to arrhythmia or sinus tachycardia. Needs a 3-day Zio please (not necessarily "live").

## 2019-01-24 NOTE — Telephone Encounter (Signed)
The patient has been notified that Dr. Sallyanne Kuster is currently out of the office and will return on Monday. He would like to know if Dr. Sallyanne Kuster feels like his Tikosyn should be increased back to 500 mcg before he picks up his new prescription on Friday. He has been made aware that it may be Monday before we can get him an answer.

## 2019-01-24 NOTE — Telephone Encounter (Signed)
Already on high dose metoprolol - may need additional agent. Would d/w Dr. Loletha Grayer when he returns.  Dr. Lemmie Evens

## 2019-02-04 ENCOUNTER — Ambulatory Visit (INDEPENDENT_AMBULATORY_CARE_PROVIDER_SITE_OTHER): Payer: Medicare Other

## 2019-02-04 DIAGNOSIS — I480 Paroxysmal atrial fibrillation: Secondary | ICD-10-CM | POA: Diagnosis not present

## 2019-02-05 ENCOUNTER — Other Ambulatory Visit: Payer: Self-pay

## 2019-02-05 ENCOUNTER — Ambulatory Visit (INDEPENDENT_AMBULATORY_CARE_PROVIDER_SITE_OTHER): Payer: Medicare Other | Admitting: Family Medicine

## 2019-02-05 ENCOUNTER — Ambulatory Visit: Payer: Medicare Other | Admitting: Family Medicine

## 2019-02-05 VITALS — BP 104/74 | HR 107 | Temp 99.7°F | Wt 189.4 lb

## 2019-02-05 DIAGNOSIS — E782 Mixed hyperlipidemia: Secondary | ICD-10-CM

## 2019-02-05 DIAGNOSIS — I10 Essential (primary) hypertension: Secondary | ICD-10-CM

## 2019-02-05 DIAGNOSIS — G4700 Insomnia, unspecified: Secondary | ICD-10-CM | POA: Diagnosis not present

## 2019-02-05 DIAGNOSIS — Z6830 Body mass index (BMI) 30.0-30.9, adult: Secondary | ICD-10-CM | POA: Diagnosis not present

## 2019-02-05 DIAGNOSIS — E1169 Type 2 diabetes mellitus with other specified complication: Secondary | ICD-10-CM | POA: Diagnosis not present

## 2019-02-05 DIAGNOSIS — F528 Other sexual dysfunction not due to a substance or known physiological condition: Secondary | ICD-10-CM | POA: Diagnosis not present

## 2019-02-05 DIAGNOSIS — M1A9XX0 Chronic gout, unspecified, without tophus (tophi): Secondary | ICD-10-CM

## 2019-02-05 DIAGNOSIS — E6609 Other obesity due to excess calories: Secondary | ICD-10-CM | POA: Diagnosis not present

## 2019-02-05 DIAGNOSIS — I4891 Unspecified atrial fibrillation: Secondary | ICD-10-CM

## 2019-02-05 DIAGNOSIS — E669 Obesity, unspecified: Secondary | ICD-10-CM

## 2019-02-05 DIAGNOSIS — R351 Nocturia: Secondary | ICD-10-CM

## 2019-02-05 MED ORDER — TEMAZEPAM 30 MG PO CAPS: 30.0000 mg | ORAL_CAPSULE | Freq: Every day | ORAL | 5 refills | Status: DC

## 2019-02-05 MED ORDER — BLOOD GLUCOSE METER KIT: PACK | 0 refills | Status: DC

## 2019-02-05 MED ORDER — LORAZEPAM 1 MG PO TABS: 1.0000 mg | ORAL_TABLET | Freq: Three times a day (TID) | ORAL | 5 refills | Status: DC | PRN

## 2019-02-05 NOTE — Assessment & Plan Note (Signed)
hgba1c acceptable, minimize simple carbs. Increase exercise as tolerated. Continue current meds 

## 2019-02-05 NOTE — Assessment & Plan Note (Signed)
Doing well on temazepam refill given

## 2019-02-05 NOTE — Progress Notes (Signed)
Patient ID: [name], male   DOB: Oct 29, 1950, 68 y.o.   MRN: [id number] Virtual Visit via Video Note  I connected with [name] on 02/05/19 at  3:20 PM EDT by a video enabled telemedicine application and verified that I am speaking with the correct person using two identifiers.  Location: Patient: home Provider: office   I discussed the limitations of evaluation and management by telemedicine and the availability of in person appointments. The patient expressed understanding and agreed to proceed. Magdalene Molly, CMA was able to get patient set up on phone visit after being unable to set up video visit   Subjective:    Patient ID: [name], male    DOB: 1951-06-06, 68 y.o.   MRN: [id number]  No chief complaint on file.   HPI Patient is in today for follow up on chronic medical concerns including atrial fibrillation, diabetes, hypertension and more. No recent febrile illness or hospitalizations. He is struggling with increased episodes of tachycardia and he has a 3 day event monitor on from cardiology. No polyuria or polydipsia. He notes he continues to struggle with insomnia but Temazepam is helpful. Denies CP/palp/SOB/HA/congestion/fevers/GI or GU c/o. Taking meds as prescribed  Past Medical History:  Diagnosis Date  . AORTIC STENOSIS, MODERATE 12/05/2009  . Atrial fibrillation ([place])   . Atrial fibrillation with RVR ([place]) 07/05/2017  . Atrial flutter ([place]) 12/05/2009  . Atypical chest pain 11/08/2011  . BENIGN PROSTATIC HYPERTROPHY, HX OF 12/05/2009  . CHEST PAIN-UNSPECIFIED 11/11/2009  . CHF 12/05/2009  . Chronic renal insufficiency 12/16/2016  . Debility 04/18/2012  . Diarrhea 12/16/2016  . DYSPNEA 12/19/2009  . Edema 04/18/2012  . ERECTILE DYSFUNCTION 01/25/2007  . FATIGUE, CHRONIC 11/11/2009  . Gout 07/10/2012  . Heart murmur   . History of kidney stones    "they passed" (07/05/2017)  . Hyperkalemia 04/18/2012  . Hyperlipidemia   . Hypertension   . HYPERTENSION  01/25/2007  . Hypoxia 05/05/2011  . Insomnia 05/13/2016  . INSOMNIA, HX OF 01/25/2007  . Knee pain, bilateral 12/28/2010  . Knee pain, right 12/28/2010  . NEUROMA 01/05/2010  . OBESITY NOS 01/25/2007  . OSA on CPAP 04/06/2010  . PERIPHERAL NEUROPATHY 01/05/2010  . PULMONARY FUNCTION TESTS, ABNORMAL 02/02/2010  . Superficial thrombophlebitis of left leg 09/07/2011  . TESTICULAR HYPOFUNCTION 01/05/2010  . 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 Toe pain, right 07/10/2012  . Type II diabetes mellitus ([place]) 12/05/2009  . UNSPECIFIED ANEMIA 01/05/2010    Past Surgical History:  Procedure Laterality Date  . CARDIAC CATHETERIZATION  10/16/2009   nonischemic cardiomyopathy  . CARDIOVERSION N/A 03/25/2015   Procedure: CARDIOVERSION;  Surgeon: Pixie Casino, MD;  Location: [facility] ENDOSCOPY;  Service: Cardiovascular;  Laterality: N/A;  . CARDIOVERSION N/A 06/17/2017   Procedure: CARDIOVERSION;  Surgeon: Sanda Klein, MD;  Location: [facility] ENDOSCOPY;  Service: Cardiovascular;  Laterality: N/A;  . CARDIOVERSION N/A 07/08/2017   Procedure: CARDIOVERSION;  Surgeon: Fay Records, MD;  Location: [facility] ENDOSCOPY;  Service: Cardiovascular;  Laterality: N/A;  . METATARSAL OSTEOTOMY Bilateral ~ 1980   removed part of 5th metatarsal to corect curvature of toes     Family History  Problem Relation Age of Onset  . Clotting disorder Mother   . Heart disease Mother        s/p MI  . Heart attack Mother   . Hypertension Mother   . Diabetes Mother   . Hyperlipidemia Mother   . Stroke Mother   . Cancer Father        ?  lung  . Lung disease Father        smoker  . Diabetes Sister   . Hypertension Sister        smoker  . Leukemia Maternal Grandmother        ?  [name] Aneurysm Sister        brain  . Other Sister        clipped  . Seizures Sister        d/o w/aneurysm/ smoker    Social History   Socioeconomic History  . Marital status: Significant Other    Spouse name: Not on file  . Number of children: 4  . Years of education: 41   . Highest education level: High school graduate  Occupational History  . Occupation: retired    Fish farm manager: [employer]  Social Needs  . Financial resource strain: Not on file  . Food insecurity    Worry: Not on file    Inability: Not on file  . Transportation needs    Medical: Not on file    Non-medical: Not on file  Tobacco Use  . Smoking status: Former Smoker    Packs/day: 1.00    Years: 10.00    Pack years: 10.00    Quit date: 06/07/1980    Years since quitting: 38.6  . Smokeless tobacco: Never Used  Substance and Sexual Activity  . Alcohol use: No  . Drug use: No  . Sexual activity: Not Currently  Lifestyle  . Physical activity    Days per week: Not on file    Minutes per session: Not on file  . Stress: Not on file  Relationships  . Social Herbalist on phone: Not on file    Gets together: Not on file    Attends religious service: Not on file    Active member of club or organization: Not on file    Attends meetings of clubs or organizations: Not on file    Relationship status: Not on file  . Intimate partner violence    Fear of current or ex partner: Not on file    Emotionally abused: Not on file    Physically abused: Not on file    Forced sexual activity: Not on file  Other Topics Concern  . Not on file  Social History Narrative   Lives with male partner in a one story home.  His son lives there off and on.  Retired from [employer].  Education: high school.      Outpatient Medications Prior to Visit  Medication Sig Dispense Refill  . albuterol (VENTOLIN HFA) 108 (90 Base) MCG/ACT inhaler Inhale 2 puffs into the lungs every 6 (six) hours as needed for wheezing or shortness of breath. 1 Inhaler 6  . allopurinol (ZYLOPRIM) 300 MG tablet Take 1 tablet by mouth once daily 90 tablet 0  . apixaban (ELIQUIS) 5 MG TABS tablet Take 1 tablet (5 mg total) by mouth 2 (two) times daily. 60 tablet 5  . benazepril (LOTENSIN) 5 MG tablet Take 1/2 (one-half) tablet by  mouth once daily 45 tablet 0  . calcium carbonate (TUMS - DOSED IN MG ELEMENTAL CALCIUM) 500 MG chewable tablet Chew 1 tablet by mouth daily as needed. For heartburn    . cetirizine (ZYRTEC) 10 MG tablet Take 1 tablet (10 mg total) by mouth daily. 30 tablet 11  . Clobetasol Prop Emollient Base (CLOBETASOL PROPIONATE E) 0.05 % emollient cream Apply 1 application topically 2 (two) times daily  as needed (insect bite). 60 g 1  . clotrimazole-betamethasone (LOTRISONE) cream Apply 1 application topically 2 (two) times daily. 30 g 1  . colchicine (COLCRYS) 0.6 MG tablet TAKE ONE TABLET BY MOUTH DAILY AS NEEDED FOR GOUT FLARE UPS. 30 tablet 0  . dofetilide (TIKOSYN) 250 MCG capsule Take 1 capsule (250 mcg total) by mouth 2 (two) times daily. 60 capsule 10  . fish oil-omega-3 fatty acids 1000 MG capsule Take 1 g by mouth daily.     . furosemide (LASIX) 40 MG tablet TAKE 2 TABLETS BY MOUTH EVERY MORNING AND TAKE 1 TABLET EVERY AFTERNOON. 270 tablet 1  . glucose blood test strip (Verio) Use as instructed Check blood sugars TID DxE11.9 100 each 12  . glyBURIDE (DIABETA) 5 MG tablet Take 1 tablet by mouth once daily with breakfast 90 tablet 0  . HYDROcodone-acetaminophen (NORCO) 10-325 MG tablet Take 1 tablet by mouth every 8 (eight) hours as needed for moderate pain or severe pain. 90 tablet 0  . magnesium oxide (MAG-OX) 400 MG tablet Take 1 tablet (400 mg total) by mouth daily. 30 tablet 11  . metFORMIN (GLUCOPHAGE) 500 MG tablet TAKE 1 TABLET BY MOUTH THREE TIMES DAILY 270 tablet 0  . metoprolol succinate (TOPROL-XL) 100 MG 24 hr tablet TAKE 1 TABLET BY MOUTH TWICE DAILY WITH OR IMMEDIATELY FOLLOWING A MEAL. 180 tablet 2  . Multiple Vitamins-Minerals (MULTIVITAMIN PO) Take 1 tablet by mouth daily.    . potassium chloride SA (K-DUR,KLOR-CON) 20 MEQ tablet TAKE 1 TABLET BY MOUTH ONCE DAILY AND  EXTRA  TABLET  IF  WEIGHT  IS  ABOVE  210  LBS 180 tablet 2  . pravastatin (PRAVACHOL) 40 MG tablet Take 1 tablet by  mouth once daily 90 tablet 1  . Testosterone 10 MG/ACT (2%) GEL Apply 1 application topically daily. Use as directed    . LORazepam (ATIVAN) 1 MG tablet Take 1 tablet (1 mg total) by mouth every 8 (eight) hours as needed for anxiety. 30 tablet 3  . temazepam (RESTORIL) 30 MG capsule TAKE 1 CAPSULE BY MOUTH AT BEDTIME AS NEEDED FOR SLEEP 30 capsule 1   No facility-administered medications prior to visit.     Allergies  Allergen Reactions  . Sulfonamide Derivatives Other (See Comments)    unknown reaction.  Patient states had to go to the ER.   [name] Zolpidem Other (See Comments)    Excessive, prolonged sedation    Review of Systems  Constitutional: Positive for malaise/fatigue. Negative for fever.  HENT: Negative for congestion.   Eyes: Negative for blurred vision.  Respiratory: Negative for shortness of breath.   Cardiovascular: Positive for palpitations. Negative for chest pain and leg swelling.  Gastrointestinal: Negative for abdominal pain, blood in stool and nausea.  Genitourinary: Negative for dysuria and frequency.  Musculoskeletal: Negative for falls.  Skin: Negative for rash.  Neurological: Negative for dizziness, loss of consciousness and headaches.  Endo/Heme/Allergies: Negative for environmental allergies.  Psychiatric/Behavioral: Negative for depression. The patient is nervous/anxious and has insomnia.        Objective:    Physical Exam unable to obtain via phone  BP 104/74 (BP Location: Left Arm, Patient Position: Sitting, Cuff Size: Normal)   Pulse (!) 107   Temp 99.7 F (37.6 C) (Oral)   Wt 189 lb 6.4 oz (85.9 kg)   SpO2 98%   BMI 26.42 kg/m  Wt Readings from Last 3 Encounters:  02/05/19 189 lb 6.4 oz (85.9 kg)  11/02/18 192  lb (87.1 kg)  08/15/18 209 lb (94.8 kg)    Diabetic Foot Exam - Simple   No data filed     Lab Results  Component Value Date   WBC 4.0 08/03/2018   HGB 14.7 08/03/2018   HCT 45.7 08/03/2018   PLT 219.0 08/03/2018   GLUCOSE 81  08/03/2018   CHOL 136 08/03/2018   TRIG 124.0 08/03/2018   HDL 35.20 (L) 08/03/2018   LDLDIRECT 84.0 05/02/2018   LDLCALC 76 08/03/2018   ALT 11 08/03/2018   AST 16 08/03/2018   NA 139 08/03/2018   K 4.6 08/03/2018   CL 104 08/03/2018   CREATININE 2.02 (H) 08/03/2018   BUN 41 (H) 08/03/2018   CO2 26 08/03/2018   TSH 1.17 08/03/2018   PSA 10.21 (H) 05/02/2018   INR 2.2 09/06/2018   HGBA1C 5.7 08/03/2018   MICROALBUR 1.1 04/14/2015    Lab Results  Component Value Date   TSH 1.17 08/03/2018   Lab Results  Component Value Date   WBC 4.0 08/03/2018   HGB 14.7 08/03/2018   HCT 45.7 08/03/2018   MCV 84.2 08/03/2018   PLT 219.0 08/03/2018   Lab Results  Component Value Date   NA 139 08/03/2018   K 4.6 08/03/2018   CO2 26 08/03/2018   GLUCOSE 81 08/03/2018   BUN 41 (H) 08/03/2018   CREATININE 2.02 (H) 08/03/2018   BILITOT 0.6 08/03/2018   ALKPHOS 38 (L) 08/03/2018   AST 16 08/03/2018   ALT 11 08/03/2018   PROT 7.6 08/03/2018   ALBUMIN 4.5 08/03/2018   CALCIUM 10.1 08/03/2018   ANIONGAP 11 07/08/2017   GFR 40.24 (L) 08/03/2018   Lab Results  Component Value Date   CHOL 136 08/03/2018   Lab Results  Component Value Date   HDL 35.20 (L) 08/03/2018   Lab Results  Component Value Date   LDLCALC 76 08/03/2018   Lab Results  Component Value Date   TRIG 124.0 08/03/2018   Lab Results  Component Value Date   CHOLHDL 4 08/03/2018   Lab Results  Component Value Date   HGBA1C 5.7 08/03/2018       Assessment & Plan:   Problem List Items Addressed This Visit    Diabetes mellitus type 2 in obese ([place])    hgba1c acceptable, minimize simple carbs. Increase exercise as tolerated. Continue current meds      Hyperlipidemia    Tolerating statin, encouraged heart healthy diet, avoid trans fats, minimize simple carbs and saturated fats. Increase exercise as tolerated      Essential hypertension, malignant    Well controlled, no changes to meds. Encouraged  heart healthy diet such as the DASH diet and exercise as tolerated.       Insomnia    Doing well on temazepam refill given      Atrial fibrillation with RVR ([place])    He is having trouble with tachycardia since they had to drop his Tikosyn dose         I have changed Adryen H. Percle's temazepam. I am also having him start on blood glucose meter kit and supplies. Additionally, I am having him maintain his fish oil-omega-3 fatty acids, calcium carbonate, Multiple Vitamins-Minerals (MULTIVITAMIN PO), Testosterone, cetirizine, magnesium oxide, colchicine, albuterol, Clobetasol Prop Emollient Base, clotrimazole-betamethasone, potassium chloride SA, metoprolol succinate, dofetilide, HYDROcodone-acetaminophen, furosemide, pravastatin, apixaban, benazepril, allopurinol, metFORMIN, glyBURIDE, glucose blood, and LORazepam.  Meds ordered this encounter  Medications  . LORazepam (ATIVAN) 1 MG tablet  Sig: Take 1 tablet (1 mg total) by mouth every 8 (eight) hours as needed for anxiety.    Dispense:  30 tablet    Refill:  5  . temazepam (RESTORIL) 30 MG capsule    Sig: Take 1 capsule (30 mg total) by mouth at bedtime.    Dispense:  30 capsule    Refill:  5  . blood glucose meter kit and supplies    Sig: Dispense based on patient and insurance preference. Check BS TID prn Dx:E11.9).    Dispense:  1 each    Refill:  0    Order Specific Question:   Number of strips    Answer:   0    Order Specific Question:   Number of lancets    Answer:   0     I discussed the assessment and treatment plan with the patient. The patient was provided an opportunity to ask questions and all were answered. The patient agreed with the plan and demonstrated an understanding of the instructions.   The patient was advised to call back or seek an in-person evaluation if the symptoms worsen or if the condition fails to improve as anticipated.  I provided 25 minutes of non-face-to-face time during this encounter.    Penni Homans, MD

## 2019-02-05 NOTE — Assessment & Plan Note (Signed)
He is having trouble with tachycardia since they had to drop his Tikosyn dose

## 2019-02-05 NOTE — Progress Notes (Signed)
Currently wearing a heart monitor for 3 days per Cardiology

## 2019-02-05 NOTE — Assessment & Plan Note (Signed)
Well controlled, no changes to meds. Encouraged heart healthy diet such as the DASH diet and exercise as tolerated.  °

## 2019-02-05 NOTE — Assessment & Plan Note (Signed)
Tolerating statin, encouraged heart healthy diet, avoid trans fats, minimize simple carbs and saturated fats. Increase exercise as tolerated 

## 2019-02-06 ENCOUNTER — Other Ambulatory Visit (INDEPENDENT_AMBULATORY_CARE_PROVIDER_SITE_OTHER): Payer: Medicare Other

## 2019-02-06 ENCOUNTER — Other Ambulatory Visit: Payer: Self-pay | Admitting: Family Medicine

## 2019-02-06 DIAGNOSIS — R972 Elevated prostate specific antigen [PSA]: Secondary | ICD-10-CM

## 2019-02-06 DIAGNOSIS — M1A9XX0 Chronic gout, unspecified, without tophus (tophi): Secondary | ICD-10-CM

## 2019-02-06 DIAGNOSIS — E1169 Type 2 diabetes mellitus with other specified complication: Secondary | ICD-10-CM | POA: Diagnosis not present

## 2019-02-06 DIAGNOSIS — I10 Essential (primary) hypertension: Secondary | ICD-10-CM | POA: Diagnosis not present

## 2019-02-06 DIAGNOSIS — E6609 Other obesity due to excess calories: Secondary | ICD-10-CM

## 2019-02-06 DIAGNOSIS — R351 Nocturia: Secondary | ICD-10-CM

## 2019-02-06 DIAGNOSIS — E669 Obesity, unspecified: Secondary | ICD-10-CM

## 2019-02-06 DIAGNOSIS — E782 Mixed hyperlipidemia: Secondary | ICD-10-CM

## 2019-02-06 LAB — CBC
HCT: 41.1 % (ref 39.0–52.0)
Hemoglobin: 13.3 g/dL (ref 13.0–17.0)
MCHC: 32.4 g/dL (ref 30.0–36.0)
MCV: 84 fl (ref 78.0–100.0)
Platelets: 197 10*3/uL (ref 150.0–400.0)
RBC: 4.9 Mil/uL (ref 4.22–5.81)
RDW: 16.8 % — ABNORMAL HIGH (ref 11.5–15.5)
WBC: 3.8 10*3/uL — ABNORMAL LOW (ref 4.0–10.5)

## 2019-02-06 LAB — COMPREHENSIVE METABOLIC PANEL
ALT: 9 U/L (ref 0–53)
AST: 14 U/L (ref 0–37)
Albumin: 4.1 g/dL (ref 3.5–5.2)
Alkaline Phosphatase: 38 U/L — ABNORMAL LOW (ref 39–117)
BUN: 47 mg/dL — ABNORMAL HIGH (ref 6–23)
CO2: 28 mEq/L (ref 19–32)
Calcium: 9.5 mg/dL (ref 8.4–10.5)
Chloride: 102 mEq/L (ref 96–112)
Creatinine, Ser: 2.02 mg/dL — ABNORMAL HIGH (ref 0.40–1.50)
GFR: 40.18 mL/min — ABNORMAL LOW (ref >60.00)
Glucose, Bld: 97 mg/dL (ref 70–99)
Potassium: 4.8 mEq/L (ref 3.5–5.1)
Sodium: 139 mEq/L (ref 135–145)
Total Bilirubin: 0.6 mg/dL (ref 0.2–1.2)
Total Protein: 6.7 g/dL (ref 6.0–8.3)

## 2019-02-06 LAB — LIPID PANEL
Cholesterol: 116 mg/dL (ref 0–200)
HDL: 32.7 mg/dL — ABNORMAL LOW (ref >39.00)
LDL Cholesterol: 63 mg/dL (ref 0–99)
NonHDL: 83.67
Total CHOL/HDL Ratio: 4
Triglycerides: 102 mg/dL (ref 0.0–149.0)
VLDL: 20.4 mg/dL (ref 0.0–40.0)

## 2019-02-06 LAB — HEMOGLOBIN A1C: Hgb A1c MFr Bld: 5.8 % (ref 4.6–6.5)

## 2019-02-06 LAB — TSH: TSH: 1.33 u[IU]/mL (ref 0.35–4.50)

## 2019-02-06 LAB — PSA: PSA: 10.76 ng/mL — ABNORMAL HIGH (ref 0.10–4.00)

## 2019-02-06 LAB — URIC ACID: Uric Acid, Serum: 5.1 mg/dL (ref 4.0–7.8)

## 2019-02-06 NOTE — Addendum Note (Signed)
Addended by: Magdalene Molly A on: 02/06/2019 10:36 AM   Modules accepted: Orders

## 2019-02-06 NOTE — Progress Notes (Unsigned)
amb re 

## 2019-02-06 NOTE — Addendum Note (Signed)
Addended by: Kelle Darting A on: 02/06/2019 12:12 PM   Modules accepted: Orders

## 2019-02-06 NOTE — Addendum Note (Signed)
Addended by: Kelle Darting A on: 02/06/2019 12:14 PM   Modules accepted: Orders

## 2019-02-07 ENCOUNTER — Encounter: Payer: Self-pay | Admitting: Cardiovascular Disease

## 2019-02-12 DIAGNOSIS — I480 Paroxysmal atrial fibrillation: Secondary | ICD-10-CM | POA: Diagnosis not present

## 2019-02-15 ENCOUNTER — Ambulatory Visit (INDEPENDENT_AMBULATORY_CARE_PROVIDER_SITE_OTHER): Payer: Medicare Other | Admitting: Cardiovascular Disease

## 2019-02-15 ENCOUNTER — Other Ambulatory Visit: Payer: Self-pay

## 2019-02-15 ENCOUNTER — Encounter: Payer: Self-pay | Admitting: Cardiovascular Disease

## 2019-02-15 VITALS — BP 120/83 | HR 123 | Temp 97.0°F | Ht 71.0 in | Wt 196.2 lb

## 2019-02-15 DIAGNOSIS — G4733 Obstructive sleep apnea (adult) (pediatric): Secondary | ICD-10-CM | POA: Diagnosis not present

## 2019-02-15 DIAGNOSIS — E663 Overweight: Secondary | ICD-10-CM | POA: Diagnosis not present

## 2019-02-15 DIAGNOSIS — Z7901 Long term (current) use of anticoagulants: Secondary | ICD-10-CM | POA: Diagnosis not present

## 2019-02-15 DIAGNOSIS — N183 Chronic kidney disease, stage 3 unspecified: Secondary | ICD-10-CM

## 2019-02-15 DIAGNOSIS — I5042 Chronic combined systolic (congestive) and diastolic (congestive) heart failure: Secondary | ICD-10-CM

## 2019-02-15 DIAGNOSIS — I10 Essential (primary) hypertension: Secondary | ICD-10-CM

## 2019-02-15 DIAGNOSIS — I351 Nonrheumatic aortic (valve) insufficiency: Secondary | ICD-10-CM | POA: Diagnosis not present

## 2019-02-15 DIAGNOSIS — I4819 Other persistent atrial fibrillation: Secondary | ICD-10-CM

## 2019-02-15 DIAGNOSIS — Z79899 Other long term (current) drug therapy: Secondary | ICD-10-CM

## 2019-02-15 DIAGNOSIS — Z5181 Encounter for therapeutic drug level monitoring: Secondary | ICD-10-CM | POA: Diagnosis not present

## 2019-02-15 MED ORDER — METOPROLOL SUCCINATE ER 50 MG PO TB24: 150.0000 mg | ORAL_TABLET | Freq: Two times a day (BID) | ORAL | 11 refills | Status: DC

## 2019-02-15 MED ORDER — DOFETILIDE 500 MCG PO CAPS: 500.0000 ug | ORAL_CAPSULE | Freq: Two times a day (BID) | ORAL | Status: DC

## 2019-02-15 NOTE — H&P (View-Only) (Signed)
Cardiology Office Note    Date:  02/15/2019   ID:  Kevin, [name] [birth year], MRN [id number]  PCP:  [name], MD  Cardiologist:   Sanda Klein, MD   Chief Complaint  Patient presents with  . Tachycardia  . Congestive Heart Failure  . Atrial Fibrillation    History of Present Illness:  [name] is a 68 y.o. male with aortic insufficiency, combined systolic and diastolic heart failure, paroxysmal atrial fibrillation and atrial flutter, obstructive sleep apnea, dyslipidemia in the setting of obesity and type 2 diabetes mellitus.  He has been periodically checking his vital signs at home and has noticed since August that he has had tachycardia.  His heart rate, previously in the 70s is now consistently around 100-110.  As before he remains completely unaware of palpitations.  We ordered a 3-day Holter monitor that shows that he is in persistent atrial flutter with variable AV block and an average ventricular rate of 109 bpm.  The heart rate only briefly crosses below 100 bpm occasionally when at rest.  In parallel he has noticed slight worsening of exertional dyspnea.  He does not have chest pain and has not had dizziness or syncope or leg edema.  He denies orthopnea and PND.  He is compliant with anticoagulation therapy and has not had any falls, injuries or bleeding problems.  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  Most recently in January 2019 his ejection fraction dropped to 30-35% when he was unaware of the arrhythmia.  He underwent cardioversion and ranolazine was added to dofetilide with a return to sinus rhythm, improved functional status and improvement in left ventricular ejection fraction to 42% by echo performed in April 2019.  In February 2020 due to worsening renal function parameters his ranolazine was stopped and the dose of dofetilide was decreased from 500 mcg to 250 mcg twice daily.  He did not have  excessive QT prolongation (QTC 473 ms at the time).  He did not have evidence of ventricular proarrhythmia.  ECG today shows atrial flutter with variable AV block and a rate of about 120 bpm.  The QTC is only 413 ms, on dofetilide 250 mg twice daily.  His creatinine has been stable at around 2.0 on labs performed in February and again a week ago.  This corresponds to a GFR of approximately 40 mL/minute.  He has continued to slowly lose weight.  He is compliant with CPAP 100% of the time and denies daytime hypersomnolence.    His echocardiograms have shown moderate aortic insufficiency related to aortic ectasia.  He has a long-standing history of severe hypertension. He also has long-standing history of obstructive sleep apnea which was also not treated until recently, but he is now 100% CPAP-compliant. Coronary angiography 2011 did not show evidence of significant stenoses. He presented with typical atrial flutter in 2011 and has recurrent paroxysmal atrial fibrillation with a good response to treatment with dofetilide, but the dose of dofetilide was decreased when his renal function deteriorated in 2020. Additional problems include obesity, type 2 diabetes mellitus, gout and androgen deficiency. He had a successful cardioversion in October 2016 and February 2019 for persistent atrial fibrillation.   Past Medical History:  Diagnosis Date  . AORTIC STENOSIS, MODERATE 12/05/2009  . Atrial fibrillation ([place])   . Atrial fibrillation with RVR ([place]) 07/05/2017  . Atrial flutter ([place]) 12/05/2009  . Atypical chest pain 11/08/2011  . BENIGN PROSTATIC HYPERTROPHY, HX OF  12/05/2009  . CHEST PAIN-UNSPECIFIED 11/11/2009  . CHF 12/05/2009  . Chronic renal insufficiency 12/16/2016  . Debility 04/18/2012  . Diarrhea 12/16/2016  . DYSPNEA 12/19/2009  . Edema 04/18/2012  . ERECTILE DYSFUNCTION 01/25/2007  . FATIGUE, CHRONIC 11/11/2009  . Gout 07/10/2012  . Heart murmur   . History of kidney stones    "they passed" (07/05/2017)   . Hyperkalemia 04/18/2012  . Hyperlipidemia   . Hypertension   . HYPERTENSION 01/25/2007  . Hypoxia 05/05/2011  . Insomnia 05/13/2016  . INSOMNIA, HX OF 01/25/2007  . Knee pain, bilateral 12/28/2010  . Knee pain, right 12/28/2010  . NEUROMA 01/05/2010  . OBESITY NOS 01/25/2007  . OSA on CPAP 04/06/2010  . PERIPHERAL NEUROPATHY 01/05/2010  . PULMONARY FUNCTION TESTS, ABNORMAL 02/02/2010  . Superficial thrombophlebitis of left leg 09/07/2011  . TESTICULAR HYPOFUNCTION 01/05/2010  . 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 Toe pain, right 07/10/2012  . Type II diabetes mellitus ([place]) 12/05/2009  . UNSPECIFIED ANEMIA 01/05/2010    Past Surgical History:  Procedure Laterality Date  . CARDIAC CATHETERIZATION  10/16/2009   nonischemic cardiomyopathy  . CARDIOVERSION N/A 03/25/2015   Procedure: CARDIOVERSION;  Surgeon: Pixie Casino, MD;  Location: [facility] ENDOSCOPY;  Service: Cardiovascular;  Laterality: N/A;  . CARDIOVERSION N/A 06/17/2017   Procedure: CARDIOVERSION;  Surgeon: Sanda Klein, MD;  Location: [facility] ENDOSCOPY;  Service: Cardiovascular;  Laterality: N/A;  . CARDIOVERSION N/A 07/08/2017   Procedure: CARDIOVERSION;  Surgeon: Fay Records, MD;  Location: [facility];  Service: Cardiovascular;  Laterality: N/A;  . METATARSAL OSTEOTOMY Bilateral ~ 1980   removed part of 5th metatarsal to corect curvature of toes     Current Medications: Outpatient Medications Prior to Visit  Medication Sig Dispense Refill  . albuterol (VENTOLIN HFA) 108 (90 Base) MCG/ACT inhaler Inhale 2 puffs into the lungs every 6 (six) hours as needed for wheezing or shortness of breath. 1 Inhaler 6  . allopurinol (ZYLOPRIM) 300 MG tablet Take 1 tablet by mouth once daily 90 tablet 0  . apixaban (ELIQUIS) 5 MG TABS tablet Take 1 tablet (5 mg total) by mouth 2 (two) times daily. 60 tablet 5  . benazepril (LOTENSIN) 5 MG tablet Take 1/2 (one-half) tablet by mouth once daily 45 tablet 0  . blood glucose meter kit and supplies Dispense based on  patient and insurance preference. Check BS TID prn Dx:E11.9). 1 each 0  . calcium carbonate (TUMS - DOSED IN MG ELEMENTAL CALCIUM) 500 MG chewable tablet Chew 1 tablet by mouth daily as needed. For heartburn    . cetirizine (ZYRTEC) 10 MG tablet Take 1 tablet (10 mg total) by mouth daily. 30 tablet 11  . Clobetasol Prop Emollient Base (CLOBETASOL PROPIONATE E) 0.05 % emollient cream Apply 1 application topically 2 (two) times daily as needed (insect bite). 60 g 1  . clotrimazole-betamethasone (LOTRISONE) cream Apply 1 application topically 2 (two) times daily. 30 g 1  . colchicine (COLCRYS) 0.6 MG tablet TAKE ONE TABLET BY MOUTH DAILY AS NEEDED FOR GOUT FLARE UPS. 30 tablet 0  . fish oil-omega-3 fatty acids 1000 MG capsule Take 1 g by mouth daily.     . furosemide (LASIX) 40 MG tablet TAKE 2 TABLETS BY MOUTH EVERY MORNING AND TAKE 1 TABLET EVERY AFTERNOON. 270 tablet 1  . glucose blood test strip (Verio) Use as instructed Check blood sugars TID DxE11.9 100 each 12  . glyBURIDE (DIABETA) 5 MG tablet Take 1 tablet by mouth once daily with breakfast 90  tablet 0  . HYDROcodone-acetaminophen (NORCO) 10-325 MG tablet Take 1 tablet by mouth every 8 (eight) hours as needed for moderate pain or severe pain. 90 tablet 0  . LORazepam (ATIVAN) 1 MG tablet Take 1 tablet (1 mg total) by mouth every 8 (eight) hours as needed for anxiety. 30 tablet 5  . magnesium oxide (MAG-OX) 400 MG tablet Take 1 tablet (400 mg total) by mouth daily. 30 tablet 11  . metFORMIN (GLUCOPHAGE) 500 MG tablet TAKE 1 TABLET BY MOUTH THREE TIMES DAILY 270 tablet 0  . Multiple Vitamins-Minerals (MULTIVITAMIN PO) Take 1 tablet by mouth daily.    . potassium chloride SA (K-DUR,KLOR-CON) 20 MEQ tablet TAKE 1 TABLET BY MOUTH ONCE DAILY AND  EXTRA  TABLET  IF  WEIGHT  IS  ABOVE  210  LBS 180 tablet 2  . pravastatin (PRAVACHOL) 40 MG tablet Take 1 tablet by mouth once daily 90 tablet 1  . temazepam (RESTORIL) 30 MG capsule Take 1 capsule (30 mg  total) by mouth at bedtime. 30 capsule 5  . Testosterone 10 MG/ACT (2%) GEL Apply 1 application topically daily. Use as directed    . dofetilide (TIKOSYN) 250 MCG capsule Take 1 capsule (250 mcg total) by mouth 2 (two) times daily. 60 capsule 10  . metoprolol succinate (TOPROL-XL) 100 MG 24 hr tablet TAKE 1 TABLET BY MOUTH TWICE DAILY WITH OR IMMEDIATELY FOLLOWING A MEAL. 180 tablet 2   No facility-administered medications prior to visit.      Allergies:   Sulfonamide derivatives and Zolpidem   Social History   Socioeconomic History  . Marital status: Significant Other    Spouse name: Not on file  . Number of children: 4  . Years of education: 58  . Highest education level: High school graduate  Occupational History  . Occupation: retired    Fish farm manager: [employer]  Social Needs  . Financial resource strain: Not on file  . Food insecurity    Worry: Not on file    Inability: Not on file  . Transportation needs    Medical: Not on file    Non-medical: Not on file  Tobacco Use  . Smoking status: Former Smoker    Packs/day: 1.00    Years: 10.00    Pack years: 10.00    Quit date: 06/07/1980    Years since quitting: 38.7  . Smokeless tobacco: Never Used  Substance and Sexual Activity  . Alcohol use: No  . Drug use: No  . Sexual activity: Not Currently  Lifestyle  . Physical activity    Days per week: Not on file    Minutes per session: Not on file  . Stress: Not on file  Relationships  . Social Herbalist on phone: Not on file    Gets together: Not on file    Attends religious service: Not on file    Active member of club or organization: Not on file    Attends meetings of clubs or organizations: Not on file    Relationship status: Not on file  Other Topics Concern  . Not on file  Social History Narrative   Lives with male partner in a one story home.  His son lives there off and on.  Retired from [employer].  Education: high school.       Family History:   The patient's family history includes Aneurysm in his sister; Cancer in his father; Clotting disorder in his mother; Diabetes in his mother  and sister; Heart attack in his mother; Heart disease in his mother; Hyperlipidemia in his mother; Hypertension in his mother and sister; Leukemia in his maternal grandmother; Lung disease in his father; Other in his sister; Seizures in his sister; Stroke in his mother.   ROS:   Please see the history of present illness.    ROS all other systems reviewed and are negative  PHYSICAL EXAM:   VS:  BP 120/83   Pulse (!) 123   Temp (!) 97 F (36.1 C)   Ht '5\' 11"'$  (1.803 m)   Wt 196 lb 3.2 oz (89 kg)   SpO2 99%   BMI 27.36 kg/m      General: Alert, oriented x3, no distress, mildly overweight Head: no evidence of trauma, PERRL, EOMI, no exophtalmos or lid lag, no myxedema, no xanthelasma; normal ears, nose and oropharynx Neck: normal jugular venous pulsations and no hepatojugular reflux; brisk carotid pulses without delay and no carotid bruits Chest: clear to auscultation, no signs of consolidation by percussion or palpation, normal fremitus, symmetrical and full respiratory excursions Cardiovascular: normal position and quality of the apical impulse, irregular rhythm, normal first and loud second heart sounds, 1-2/6 aortic ejection murmur at the right upper sternal border, 2-3/6 decrescendo diastolic murmur at the left lower sternal border, no rubs or gallops Abdomen: no tenderness or distention, no masses by palpation, no abnormal pulsatility or arterial bruits, normal bowel sounds, no hepatosplenomegaly Extremities: no clubbing, cyanosis or edema; 2+ radial, ulnar and brachial pulses bilaterally; 2+ right femoral, posterior tibial and dorsalis pedis pulses; 2+ left femoral, posterior tibial and dorsalis pedis pulses; no subclavian or femoral bruits Neurological: grossly nonfocal Psych: Normal mood and affect   Wt Readings from Last 3 Encounters:  02/15/19  196 lb 3.2 oz (89 kg)  02/05/19 189 lb 6.4 oz (85.9 kg)  11/02/18 192 lb (87.1 kg)   Studies/Labs Reviewed:   EKG:  EKG is ordered today.  It shows atrial flutter/coarse atrial fibrillation, variable AV block and rapid ventricular response, minor intraventricular conduction delay, QTC 413 ms  Lipid Panel    Component Value Date/Time   CHOL 116 02/06/2019 1217   TRIG 102.0 02/06/2019 1217   HDL 32.70 (L) 02/06/2019 1217   CHOLHDL 4 02/06/2019 1217   VLDL 20.4 02/06/2019 1217   LDLCALC 63 02/06/2019 1217   LDLDIRECT 84.0 05/02/2018 1523   3-day event monitor with persistent arrhythmia (afib/aflutter), occasional aberrancy.  ASSESSMENT:    1. Other persistent atrial fibrillation   2. Chronic combined systolic and diastolic heart failure ([place])   3. Nonrheumatic aortic (valve) insufficiency   4. OSA (obstructive sleep apnea)   5. Overweight   6. Essential hypertension   7. Long term (current) use of anticoagulants   8. Encounter for monitoring dofetilide therapy   9. CKD (chronic kidney disease) stage 3, GFR 30-59 ml/min (HCC)      PLAN:  In order of problems listed above:  1. CHF (predominantly diastolic): Clinically he appears euvolemic.  He has lost real weight and will need to reestablish his "dry weight".  However, there has been recent decline in functional status I suspect he has recurrent tachycardia cardiomyopathy.  We will plan an echocardiogram in the near future. 2. AFib:   He was doing well and taking a combination of ranolazine and maximum dose dofetilide and never had excessive QT interval prolongation.  Unfortunately we had to reduce the dose of dofetilide and stop the ranolazine due to worsening renal function.  He is back in atrial fibrillation and this appears to be persistent and he consistently has RVR essentially 24 hours a day.  He has developed tachycardia cardiomyopathy on 2 different occasions, both leading to hospitalizations. He was intolerant of  amiodarone due to intractable nausea and vomiting.  I think the best option is to increase the dofetilide back to 500 mcg every 12 hours, monitor his QT interval, probably will need to schedule him for cardioversion. We discussed the fact that typically he would be hospitalized for such an adjustment in the dofetilide dose, but he is reluctant to do so in the current coronavirus pandemic, which is understandable.  As mentioned he has never had excessive QT interval prolongation even on maximum dose dofetilide and he has never had evidence of ventricular proarrhythmia.  Will increase the dofetilide to 500 mcg twice daily and bring him back for an ECG after he has taken 4 doses to make sure the QT is not excessively prolonged. We will also increase the metoprolol 150 mg twice daily for better rate control. Consider trying amiodarone again, without a loading dose to see if he has better GI tolerability.   Will probably need EP consultation. CHADSVAsc 3 (HTN, CHF, DM). Reminded him of the risk of  multiple drug interactions with dofetilide and with warfarin. 3. Moderate AI: Repeat echo in the near future.  On multiple echoes this has always been moderate, never appeared severe enough to be the cause of cardiomyopathy. 4. OSA: He reports 100% compliance with CPAP.  Denies daytime hypersomnolence. 5.  Overweight: He is doing an excellent job with weight loss. 6. HTN: Well-controlled. 7. Eliquis: Excellent compliance with follow-up, 8. Tikosyn: back for ECG for QT on Monday, before the 5th dose of 500 mcg twice daily. 9. CKD: Although he has CKD stage III he seems to have a stable GFR around 40, unchanged over the last 6 to 7 months.  Medication Adjustments/Labs and Tests Ordered: Current medicines are reviewed at length with the patient today.  Concerns regarding medicines are outlined above.  Medication changes, Labs and Tests ordered today are listed in the Patient Instructions below. Patient  Instructions  Medication Instructions:  INCREASE the Metoprolol Succinate to 150 mg (3- 50 mg tablets) twice daily  TIKOSYN: Starting Saturday September 12th, take Tikosyn 500 mcg every 12 hours. DO NOT take it Monday morning until after you have had your EKG and until you have heard from the office to do so.   If you need a refill on your cardiac medications before your next appointment, please call your pharmacy.   Lab work: None ordered If you have labs (blood work) drawn today and your tests are completely normal, you will receive your results only by: [name] MyChart Message (if you have MyChart) OR . A paper copy in the mail If you have any lab test that is abnormal or we need to change your treatment, we will call you to review the results.  Testing/Procedures: None ordered  Follow-Up: EKG: You will need to have an EKG Monday morning at 8:30-do not take your Tikosyn that morning.   Follow up on 9/23 at 3:20 pm     Signed, Sanda Klein, MD  02/15/2019 1:23 PM    [facility] [place], [place], [place]  [address] Phone: [phone]; Fax: [phone]

## 2019-02-15 NOTE — Progress Notes (Signed)
Cardiology Office Note    Date:  02/15/2019   ID:  [name], [name] 14-Jul-1950, MRN [id number]  PCP:  Kevin Lukes, MD  Cardiologist:   Kevin Klein, MD   Chief Complaint  Patient presents with  . Tachycardia  . Congestive Heart Failure  . Atrial Fibrillation    History of Present Illness:  [name] is a 68 y.o. male with aortic insufficiency, combined systolic and diastolic heart failure, paroxysmal atrial fibrillation and atrial flutter, obstructive sleep apnea, dyslipidemia in the setting of obesity and type 2 diabetes mellitus.  He has been periodically checking his vital signs at home and has noticed since August that he has had tachycardia.  His heart rate, previously in the 70s is now consistently around 100-110.  As before he remains completely unaware of palpitations.  We ordered a 3-day Holter monitor that shows that he is in persistent atrial flutter with variable AV block and an average ventricular rate of 109 bpm.  The heart rate only briefly crosses below 100 bpm occasionally when at rest.  In parallel he has noticed slight worsening of exertional dyspnea.  He does not have chest pain and has not had dizziness or syncope or leg edema.  He denies orthopnea and PND.  He is compliant with anticoagulation therapy and has not had any falls, injuries or bleeding problems.  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  Most recently in January 2019 his ejection fraction dropped to 30-35% when he was unaware of the arrhythmia.  He underwent cardioversion and ranolazine was added to dofetilide with a return to sinus rhythm, improved functional status and improvement in left ventricular ejection fraction to 42% by echo performed in April 2019.  In February 2020 due to worsening renal function parameters his ranolazine was stopped and the dose of dofetilide was decreased from 500 mcg to 250 mcg twice daily.  He did not have  excessive QT prolongation (QTC 473 ms at the time).  He did not have evidence of ventricular proarrhythmia.  ECG today shows atrial flutter with variable AV block and a rate of about 120 bpm.  The QTC is only 413 ms, on dofetilide 250 mg twice daily.  His creatinine has been stable at around 2.0 on labs performed in February and again a week ago.  This corresponds to a GFR of approximately 40 mL/minute.  He has continued to slowly lose weight.  He is compliant with CPAP 100% of the time and denies daytime hypersomnolence.    His echocardiograms have shown moderate aortic insufficiency related to aortic ectasia.  He has a long-standing history of severe hypertension. He also has long-standing history of obstructive sleep apnea which was also not treated until recently, but he is now 100% CPAP-compliant. Coronary angiography 2011 did not show evidence of significant stenoses. He presented with typical atrial flutter in 2011 and has recurrent paroxysmal atrial fibrillation with a good response to treatment with dofetilide, but the dose of dofetilide was decreased when his renal function deteriorated in 2020. Additional problems include obesity, type 2 diabetes mellitus, gout and androgen deficiency. He had a successful cardioversion in October 2016 and February 2019 for persistent atrial fibrillation.   Past Medical History:  Diagnosis Date  . AORTIC STENOSIS, MODERATE 12/05/2009  . Atrial fibrillation ([place])   . Atrial fibrillation with RVR ([place]) 07/05/2017  . Atrial flutter ([place]) 12/05/2009  . Atypical chest pain 11/08/2011  . BENIGN PROSTATIC HYPERTROPHY, HX OF  12/05/2009  . CHEST PAIN-UNSPECIFIED 11/11/2009  . CHF 12/05/2009  . Chronic renal insufficiency 12/16/2016  . Debility 04/18/2012  . Diarrhea 12/16/2016  . DYSPNEA 12/19/2009  . Edema 04/18/2012  . ERECTILE DYSFUNCTION 01/25/2007  . FATIGUE, CHRONIC 11/11/2009  . Gout 07/10/2012  . Heart murmur   . History of kidney stones    "they passed" (07/05/2017)   . Hyperkalemia 04/18/2012  . Hyperlipidemia   . Hypertension   . HYPERTENSION 01/25/2007  . Hypoxia 05/05/2011  . Insomnia 05/13/2016  . INSOMNIA, HX OF 01/25/2007  . Knee pain, bilateral 12/28/2010  . Knee pain, right 12/28/2010  . NEUROMA 01/05/2010  . OBESITY NOS 01/25/2007  . OSA on CPAP 04/06/2010  . PERIPHERAL NEUROPATHY 01/05/2010  . PULMONARY FUNCTION TESTS, ABNORMAL 02/02/2010  . Superficial thrombophlebitis of left leg 09/07/2011  . TESTICULAR HYPOFUNCTION 01/05/2010  . 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 Toe pain, right 07/10/2012  . Type II diabetes mellitus ([place]) 12/05/2009  . UNSPECIFIED ANEMIA 01/05/2010    Past Surgical History:  Procedure Laterality Date  . CARDIAC CATHETERIZATION  10/16/2009   nonischemic cardiomyopathy  . CARDIOVERSION N/A 03/25/2015   Procedure: CARDIOVERSION;  Surgeon: Pixie Casino, MD;  Location: [facility] ENDOSCOPY;  Service: Cardiovascular;  Laterality: N/A;  . CARDIOVERSION N/A 06/17/2017   Procedure: CARDIOVERSION;  Surgeon: Kevin Klein, MD;  Location: [facility] ENDOSCOPY;  Service: Cardiovascular;  Laterality: N/A;  . CARDIOVERSION N/A 07/08/2017   Procedure: CARDIOVERSION;  Surgeon: Fay Records, MD;  Location: [facility];  Service: Cardiovascular;  Laterality: N/A;  . METATARSAL OSTEOTOMY Bilateral ~ 1980   removed part of 5th metatarsal to corect curvature of toes     Current Medications: Outpatient Medications Prior to Visit  Medication Sig Dispense Refill  . albuterol (VENTOLIN HFA) 108 (90 Base) MCG/ACT inhaler Inhale 2 puffs into the lungs every 6 (six) hours as needed for wheezing or shortness of breath. 1 Inhaler 6  . allopurinol (ZYLOPRIM) 300 MG tablet Take 1 tablet by mouth once daily 90 tablet 0  . apixaban (ELIQUIS) 5 MG TABS tablet Take 1 tablet (5 mg total) by mouth 2 (two) times daily. 60 tablet 5  . benazepril (LOTENSIN) 5 MG tablet Take 1/2 (one-half) tablet by mouth once daily 45 tablet 0  . blood glucose meter kit and supplies Dispense based on  patient and insurance preference. Check BS TID prn Dx:E11.9). 1 each 0  . calcium carbonate (TUMS - DOSED IN MG ELEMENTAL CALCIUM) 500 MG chewable tablet Chew 1 tablet by mouth daily as needed. For heartburn    . cetirizine (ZYRTEC) 10 MG tablet Take 1 tablet (10 mg total) by mouth daily. 30 tablet 11  . Clobetasol Prop Emollient Base (CLOBETASOL PROPIONATE E) 0.05 % emollient cream Apply 1 application topically 2 (two) times daily as needed (insect bite). 60 g 1  . clotrimazole-betamethasone (LOTRISONE) cream Apply 1 application topically 2 (two) times daily. 30 g 1  . colchicine (COLCRYS) 0.6 MG tablet TAKE ONE TABLET BY MOUTH DAILY AS NEEDED FOR GOUT FLARE UPS. 30 tablet 0  . fish oil-omega-3 fatty acids 1000 MG capsule Take 1 g by mouth daily.     . furosemide (LASIX) 40 MG tablet TAKE 2 TABLETS BY MOUTH EVERY MORNING AND TAKE 1 TABLET EVERY AFTERNOON. 270 tablet 1  . glucose blood test strip (Verio) Use as instructed Check blood sugars TID DxE11.9 100 each 12  . glyBURIDE (DIABETA) 5 MG tablet Take 1 tablet by mouth once daily with breakfast 90  tablet 0  . HYDROcodone-acetaminophen (NORCO) 10-325 MG tablet Take 1 tablet by mouth every 8 (eight) hours as needed for moderate pain or severe pain. 90 tablet 0  . LORazepam (ATIVAN) 1 MG tablet Take 1 tablet (1 mg total) by mouth every 8 (eight) hours as needed for anxiety. 30 tablet 5  . magnesium oxide (MAG-OX) 400 MG tablet Take 1 tablet (400 mg total) by mouth daily. 30 tablet 11  . metFORMIN (GLUCOPHAGE) 500 MG tablet TAKE 1 TABLET BY MOUTH THREE TIMES DAILY 270 tablet 0  . Multiple Vitamins-Minerals (MULTIVITAMIN PO) Take 1 tablet by mouth daily.    . potassium chloride SA (K-DUR,KLOR-CON) 20 MEQ tablet TAKE 1 TABLET BY MOUTH ONCE DAILY AND  EXTRA  TABLET  IF  WEIGHT  IS  ABOVE  210  LBS 180 tablet 2  . pravastatin (PRAVACHOL) 40 MG tablet Take 1 tablet by mouth once daily 90 tablet 1  . temazepam (RESTORIL) 30 MG capsule Take 1 capsule (30 mg  total) by mouth at bedtime. 30 capsule 5  . Testosterone 10 MG/ACT (2%) GEL Apply 1 application topically daily. Use as directed    . dofetilide (TIKOSYN) 250 MCG capsule Take 1 capsule (250 mcg total) by mouth 2 (two) times daily. 60 capsule 10  . metoprolol succinate (TOPROL-XL) 100 MG 24 hr tablet TAKE 1 TABLET BY MOUTH TWICE DAILY WITH OR IMMEDIATELY FOLLOWING A MEAL. 180 tablet 2   No facility-administered medications prior to visit.      Allergies:   Sulfonamide derivatives and Zolpidem   Social History   Socioeconomic History  . Marital status: Significant Other    Spouse name: Not on file  . Number of children: 4  . Years of education: 34  . Highest education level: High school graduate  Occupational History  . Occupation: retired    Fish farm manager: [employer]  Social Needs  . Financial resource strain: Not on file  . Food insecurity    Worry: Not on file    Inability: Not on file  . Transportation needs    Medical: Not on file    Non-medical: Not on file  Tobacco Use  . Smoking status: Former Smoker    Packs/day: 1.00    Years: 10.00    Pack years: 10.00    Quit date: 06/07/1980    Years since quitting: 38.7  . Smokeless tobacco: Never Used  Substance and Sexual Activity  . Alcohol use: No  . Drug use: No  . Sexual activity: Not Currently  Lifestyle  . Physical activity    Days per week: Not on file    Minutes per session: Not on file  . Stress: Not on file  Relationships  . Social Herbalist on phone: Not on file    Gets together: Not on file    Attends religious service: Not on file    Active member of club or organization: Not on file    Attends meetings of clubs or organizations: Not on file    Relationship status: Not on file  Other Topics Concern  . Not on file  Social History Narrative   Lives with male partner in a one story home.  His son lives there off and on.  Retired from [employer].  Education: high school.       Family History:   The patient's family history includes Aneurysm in his sister; Cancer in his father; Clotting disorder in his mother; Diabetes in his mother  and sister; Heart attack in his mother; Heart disease in his mother; Hyperlipidemia in his mother; Hypertension in his mother and sister; Leukemia in his maternal grandmother; Lung disease in his father; Other in his sister; Seizures in his sister; Stroke in his mother.   ROS:   Please see the history of present illness.    ROS all other systems reviewed and are negative  PHYSICAL EXAM:   VS:  BP 120/83   Pulse (!) 123   Temp (!) 97 F (36.1 C)   Ht '5\' 11"'$  (1.803 m)   Wt 196 lb 3.2 oz (89 kg)   SpO2 99%   BMI 27.36 kg/m      General: Alert, oriented x3, no distress, mildly overweight Head: no evidence of trauma, PERRL, EOMI, no exophtalmos or lid lag, no myxedema, no xanthelasma; normal ears, nose and oropharynx Neck: normal jugular venous pulsations and no hepatojugular reflux; brisk carotid pulses without delay and no carotid bruits Chest: clear to auscultation, no signs of consolidation by percussion or palpation, normal fremitus, symmetrical and full respiratory excursions Cardiovascular: normal position and quality of the apical impulse, irregular rhythm, normal first and loud second heart sounds, 1-2/6 aortic ejection murmur at the right upper sternal border, 2-3/6 decrescendo diastolic murmur at the left lower sternal border, no rubs or gallops Abdomen: no tenderness or distention, no masses by palpation, no abnormal pulsatility or arterial bruits, normal bowel sounds, no hepatosplenomegaly Extremities: no clubbing, cyanosis or edema; 2+ radial, ulnar and brachial pulses bilaterally; 2+ right femoral, posterior tibial and dorsalis pedis pulses; 2+ left femoral, posterior tibial and dorsalis pedis pulses; no subclavian or femoral bruits Neurological: grossly nonfocal Psych: Normal mood and affect   Wt Readings from Last 3 Encounters:  02/15/19  196 lb 3.2 oz (89 kg)  02/05/19 189 lb 6.4 oz (85.9 kg)  11/02/18 192 lb (87.1 kg)   Studies/Labs Reviewed:   EKG:  EKG is ordered today.  It shows atrial flutter/coarse atrial fibrillation, variable AV block and rapid ventricular response, minor intraventricular conduction delay, QTC 413 ms  Lipid Panel    Component Value Date/Time   CHOL 116 02/06/2019 1217   TRIG 102.0 02/06/2019 1217   HDL 32.70 (L) 02/06/2019 1217   CHOLHDL 4 02/06/2019 1217   VLDL 20.4 02/06/2019 1217   LDLCALC 63 02/06/2019 1217   LDLDIRECT 84.0 05/02/2018 1523   3-day event monitor with persistent arrhythmia (afib/aflutter), occasional aberrancy.  ASSESSMENT:    1. Other persistent atrial fibrillation   2. Chronic combined systolic and diastolic heart failure ([place])   3. Nonrheumatic aortic (valve) insufficiency   4. OSA (obstructive sleep apnea)   5. Overweight   6. Essential hypertension   7. Long term (current) use of anticoagulants   8. Encounter for monitoring dofetilide therapy   9. CKD (chronic kidney disease) stage 3, GFR 30-59 ml/min (HCC)      PLAN:  In order of problems listed above:  1. CHF (predominantly diastolic): Clinically he appears euvolemic.  He has lost real weight and will need to reestablish his "dry weight".  However, there has been recent decline in functional status I suspect he has recurrent tachycardia cardiomyopathy.  We will plan an echocardiogram in the near future. 2. AFib:   He was doing well and taking a combination of ranolazine and maximum dose dofetilide and never had excessive QT interval prolongation.  Unfortunately we had to reduce the dose of dofetilide and stop the ranolazine due to worsening renal function.  He is back in atrial fibrillation and this appears to be persistent and he consistently has RVR essentially 24 hours a day.  He has developed tachycardia cardiomyopathy on 2 different occasions, both leading to hospitalizations. He was intolerant of  amiodarone due to intractable nausea and vomiting.  I think the best option is to increase the dofetilide back to 500 mcg every 12 hours, monitor his QT interval, probably will need to schedule him for cardioversion. We discussed the fact that typically he would be hospitalized for such an adjustment in the dofetilide dose, but he is reluctant to do so in the current coronavirus pandemic, which is understandable.  As mentioned he has never had excessive QT interval prolongation even on maximum dose dofetilide and he has never had evidence of ventricular proarrhythmia.  Will increase the dofetilide to 500 mcg twice daily and bring him back for an ECG after he has taken 4 doses to make sure the QT is not excessively prolonged. We will also increase the metoprolol 150 mg twice daily for better rate control. Consider trying amiodarone again, without a loading dose to see if he has better GI tolerability.   Will probably need EP consultation. CHADSVAsc 3 (HTN, CHF, DM). Reminded him of the risk of  multiple drug interactions with dofetilide and with warfarin. 3. Moderate AI: Repeat echo in the near future.  On multiple echoes this has always been moderate, never appeared severe enough to be the cause of cardiomyopathy. 4. OSA: He reports 100% compliance with CPAP.  Denies daytime hypersomnolence. 5.  Overweight: He is doing an excellent job with weight loss. 6. HTN: Well-controlled. 7. Eliquis: Excellent compliance with follow-up, 8. Tikosyn: back for ECG for QT on Monday, before the 5th dose of 500 mcg twice daily. 9. CKD: Although he has CKD stage III he seems to have a stable GFR around 40, unchanged over the last 6 to 7 months.  Medication Adjustments/Labs and Tests Ordered: Current medicines are reviewed at length with the patient today.  Concerns regarding medicines are outlined above.  Medication changes, Labs and Tests ordered today are listed in the Patient Instructions below. Patient  Instructions  Medication Instructions:  INCREASE the Metoprolol Succinate to 150 mg (3- 50 mg tablets) twice daily  TIKOSYN: Starting Saturday September 12th, take Tikosyn 500 mcg every 12 hours. DO NOT take it Monday morning until after you have had your EKG and until you have heard from the office to do so.   If you need a refill on your cardiac medications before your next appointment, please call your pharmacy.   Lab work: None ordered If you have labs (blood work) drawn today and your tests are completely normal, you will receive your results only by: [name] MyChart Message (if you have MyChart) OR . A paper copy in the mail If you have any lab test that is abnormal or we need to change your treatment, we will call you to review the results.  Testing/Procedures: None ordered  Follow-Up: EKG: You will need to have an EKG Monday morning at 8:30-do not take your Tikosyn that morning.   Follow up on 9/23 at 3:20 pm     Signed, Kevin Klein, MD  02/15/2019 1:23 PM    [facility] [place], [place],   [address] Phone: [phone]; Fax: [phone]

## 2019-02-15 NOTE — Patient Instructions (Addendum)
Medication Instructions:  INCREASE the Metoprolol Succinate to 150 mg (3- 50 mg tablets) twice daily  TIKOSYN: Starting Saturday September 12th, take Tikosyn 500 mcg every 12 hours. DO NOT take it Monday morning until after you have had your EKG and until you have heard from the office to do so.   If you need a refill on your cardiac medications before your next appointment, please call your pharmacy.   Lab work: None ordered If you have labs (blood work) drawn today and your tests are completely normal, you will receive your results only by: [name] MyChart Message (if you have MyChart) OR . A paper copy in the mail If you have any lab test that is abnormal or we need to change your treatment, we will call you to review the results.  Testing/Procedures: None ordered  Follow-Up: EKG: You will need to have an EKG Monday morning at 8:30-do not take your Tikosyn that morning.   Follow up on 9/23 at 3:20 pm

## 2019-02-19 ENCOUNTER — Other Ambulatory Visit: Payer: Self-pay

## 2019-02-19 ENCOUNTER — Ambulatory Visit
Admission: RE | Admit: 2019-02-19 | Discharge: 2019-02-19 | Disposition: A | Discharge status: Home / Self Care | Payer: Medicare Other | Source: Ambulatory Visit | Attending: Nephrology | Admitting: Nephrology

## 2019-02-19 ENCOUNTER — Ambulatory Visit (INDEPENDENT_AMBULATORY_CARE_PROVIDER_SITE_OTHER): Payer: Medicare Other | Admitting: Cardiovascular Disease

## 2019-02-19 VITALS — BP 105/80 | HR 127

## 2019-02-19 DIAGNOSIS — N183 Chronic kidney disease, stage 3 unspecified: Secondary | ICD-10-CM

## 2019-02-19 DIAGNOSIS — Z79899 Other long term (current) drug therapy: Secondary | ICD-10-CM | POA: Diagnosis not present

## 2019-02-19 DIAGNOSIS — N281 Cyst of kidney, acquired: Secondary | ICD-10-CM | POA: Diagnosis not present

## 2019-02-19 NOTE — Patient Instructions (Addendum)
Medication Instructions:  You were instructed the following on 02/15/2019:  Starting Saturday September 12th, take Tikosyn 500 mcg every 12 hours. DO NOT take it Monday morning until after you have had your EKG and until you have heard from the office to do so.   Dr. Sallyanne Kuster has reviewed your most recent EKG (collected 02/19/2019) and recommends that you remain on your current dose of dofetilide (Tikosyn) 500 mcg every 12 hours and plans to have you scheduled for an outpatient cardioversion.  If you need a refill on your cardiac medications before your next appointment, please call your pharmacy.   Lab work: NONE If you have labs (blood work) drawn today and your tests are completely normal, you will receive your results only by: [name] MyChart Message (if you have MyChart) OR . A paper copy in the mail If you have any lab test that is abnormal or we need to change your treatment, we will call you to review the results.  Testing/Procedures: NONE  Follow-Up: At [facility], you and your health needs are our priority.  As part of our continuing mission to provide you with exceptional heart care, we have created designated Provider Care Teams.  These Care Teams include your primary Cardiologist (physician) and Advanced Practice Providers (APPs -  Physician Assistants and Nurse Practitioners) who all work together to provide you with the care you need, when you need it.  YOU HAVE A FOLLOW UP APPOINTMENT WITH DR. Sallyanne Kuster ON 02/28/2019 AT 3:20PM.

## 2019-02-19 NOTE — Progress Notes (Signed)
Came to office today for a recheck on QT interval on dofetilide 500 mcg Q12h. QTc remains acceptable at about 490 ms, but still in atrial flutter/coarse atrial fibrillation with poor rate control. Continue current meds and we will bring him in for an elective outpatient DCCV.  Sanda Klein, MD, [facility] [phone] office [phone] pager

## 2019-02-20 ENCOUNTER — Encounter: Payer: Self-pay | Admitting: *Deleted

## 2019-02-20 ENCOUNTER — Other Ambulatory Visit: Payer: Self-pay | Admitting: *Deleted

## 2019-02-20 ENCOUNTER — Telehealth: Payer: Self-pay | Admitting: *Deleted

## 2019-02-20 DIAGNOSIS — I4819 Other persistent atrial fibrillation: Secondary | ICD-10-CM

## 2019-02-20 DIAGNOSIS — Z01818 Encounter for other preprocedural examination: Secondary | ICD-10-CM

## 2019-02-20 NOTE — Telephone Encounter (Signed)
Call placed to the patient:  Came to office today for a recheck on QT interval on dofetilide 500 mcg Q12h. QTc remains acceptable at about 490 ms, but still in atrial flutter/coarse atrial fibrillation with poor rate control. Continue current meds and we will bring him in for an elective outpatient DCCV.  The cardioversion has been scheduled for 02/27/2019. Instructions have been provided and will be mailed as well.    You are scheduled for a  Cardioversion on 02/27/2019 with Dr. Sallyanne Kuster.  Please arrive at the [facility] (Main Entrance A) at [facility]: [address] [place], [place] [address] at 12:30 pm. (1 hour prior to procedure unless lab work is needed)  DIET: Nothing to eat or drink after midnight except a sip of water with medications (see medication instructions below)  Medication Instructions: Hold Furosemide the morning of the procedure and all diabetic medication.   Continue your anticoagulant: Eliquis You will need to continue your anticoagulant after your procedure until you are told by your provider that it is safe to stop   Labs:  Your provider would like for you to return on Friday 02/23/2019 to have the following labs drawn: CBC and BMET. You do not need an appointment for the lab. Once in our office lobby there is a podium where you can sign in and ring the doorbell to alert [place] that you are here. The lab is open from 8:00 am to 4:30 pm; closed for lunch from 12:45pm-1:45pm.  You will need to have the coronavirus test completed prior to your procedure. An appointment has been made at 12:20 on 9/18. This is a Drive Up Visit at the [facility] [address] Someone will direct you to the appropriate testing line. Stay in your car and someone will be with you shortly. Please make sure to have all other labs completed before this test because you will need to stay quarantined until your procedure.   You must have a responsible person to drive you home  and stay in the waiting area during your procedure. Failure to do so could result in cancellation.  Bring your insurance cards.  *Special Note: Every effort is made to have your procedure done on time. Occasionally there are emergencies that occur at the hospital that may cause delays. Please be patient if a delay does occur.

## 2019-02-22 ENCOUNTER — Other Ambulatory Visit: Payer: Self-pay | Admitting: *Deleted

## 2019-02-22 DIAGNOSIS — I129 Hypertensive chronic kidney disease with stage 1 through stage 4 chronic kidney disease, or unspecified chronic kidney disease: Secondary | ICD-10-CM | POA: Diagnosis not present

## 2019-02-22 DIAGNOSIS — B181 Chronic viral hepatitis B without delta-agent: Secondary | ICD-10-CM | POA: Diagnosis not present

## 2019-02-22 DIAGNOSIS — N183 Chronic kidney disease, stage 3 (moderate): Secondary | ICD-10-CM | POA: Diagnosis not present

## 2019-02-22 DIAGNOSIS — E1122 Type 2 diabetes mellitus with diabetic chronic kidney disease: Secondary | ICD-10-CM | POA: Diagnosis not present

## 2019-02-22 DIAGNOSIS — N189 Chronic kidney disease, unspecified: Secondary | ICD-10-CM | POA: Diagnosis not present

## 2019-02-22 MED ORDER — ONETOUCH VERIO W/DEVICE KIT: PACK | 0 refills | Status: DC

## 2019-02-23 ENCOUNTER — Other Ambulatory Visit (HOSPITAL_COMMUNITY)
Admission: RE | Admit: 2019-02-23 | Discharge: 2019-02-23 | Disposition: A | Discharge status: Home / Self Care | Payer: Medicare Other | Source: Ambulatory Visit | Attending: Cardiovascular Disease | Admitting: Cardiovascular Disease

## 2019-02-23 DIAGNOSIS — Z01812 Encounter for preprocedural laboratory examination: Secondary | ICD-10-CM | POA: Insufficient documentation

## 2019-02-23 DIAGNOSIS — I4819 Other persistent atrial fibrillation: Secondary | ICD-10-CM | POA: Diagnosis not present

## 2019-02-23 DIAGNOSIS — Z01818 Encounter for other preprocedural examination: Secondary | ICD-10-CM | POA: Diagnosis not present

## 2019-02-23 DIAGNOSIS — Z20828 Contact with and (suspected) exposure to other viral communicable diseases: Secondary | ICD-10-CM | POA: Diagnosis not present

## 2019-02-23 LAB — CBC
Hematocrit: 41.3 % (ref 37.5–51.0)
Hemoglobin: 13.2 g/dL (ref 13.0–17.7)
MCH: 26.7 pg (ref 26.6–33.0)
MCHC: 32 g/dL (ref 31.5–35.7)
MCV: 83 fL (ref 79–97)
NRBC: 1 % — ABNORMAL HIGH (ref 0–0)
Platelets: 189 10*3/uL (ref 150–450)
RBC: 4.95 x10E6/uL (ref 4.14–5.80)
RDW: 14.4 % (ref 11.6–15.4)
WBC: 4.1 10*3/uL (ref 3.4–10.8)

## 2019-02-23 LAB — BASIC METABOLIC PANEL
BUN/Creatinine Ratio: 23 (ref 10–24)
BUN: 45 mg/dL — ABNORMAL HIGH (ref 8–27)
CO2: 21 mmol/L (ref 20–29)
Calcium: 9.8 mg/dL (ref 8.6–10.2)
Chloride: 103 mmol/L (ref 96–106)
Creatinine, Ser: 2 mg/dL — ABNORMAL HIGH (ref 0.76–1.27)
GFR calc Af Amer: 39 mL/min/{1.73_m2} — ABNORMAL LOW (ref >59)
GFR calc non Af Amer: 34 mL/min/{1.73_m2} — ABNORMAL LOW (ref >59)
Glucose: 160 mg/dL — ABNORMAL HIGH (ref 65–99)
Potassium: 4.8 mmol/L (ref 3.5–5.2)
Sodium: 140 mmol/L (ref 134–144)

## 2019-02-24 LAB — NOVEL CORONAVIRUS, NAA (HOSP ORDER, SEND-OUT TO REF LAB; TAT 18-24 HRS): SARS-CoV-2, NAA: NOT DETECTED

## 2019-02-25 ENCOUNTER — Other Ambulatory Visit: Payer: Self-pay | Admitting: Family Medicine

## 2019-02-25 ENCOUNTER — Other Ambulatory Visit: Payer: Self-pay | Admitting: Internal Medicine

## 2019-02-25 ENCOUNTER — Other Ambulatory Visit: Payer: Self-pay | Admitting: Cardiovascular Disease

## 2019-02-27 ENCOUNTER — Ambulatory Visit (HOSPITAL_COMMUNITY): Payer: Medicare Other | Admitting: Anesthesiology

## 2019-02-27 ENCOUNTER — Ambulatory Visit (HOSPITAL_COMMUNITY)
Admission: RE | Admit: 2019-02-27 | Discharge: 2019-02-27 | Disposition: A | Discharge status: Home / Self Care | Payer: Medicare Other | Attending: Cardiovascular Disease | Admitting: Cardiovascular Disease

## 2019-02-27 ENCOUNTER — Encounter (HOSPITAL_COMMUNITY): Payer: Self-pay

## 2019-02-27 ENCOUNTER — Encounter (HOSPITAL_COMMUNITY)
Admission: RE | Disposition: A | Discharge status: Home / Self Care | Payer: Medicare Other | Source: Home / Self Care | Attending: Cardiovascular Disease

## 2019-02-27 ENCOUNTER — Other Ambulatory Visit: Payer: Self-pay

## 2019-02-27 DIAGNOSIS — Z79899 Other long term (current) drug therapy: Secondary | ICD-10-CM | POA: Insufficient documentation

## 2019-02-27 DIAGNOSIS — E669 Obesity, unspecified: Secondary | ICD-10-CM | POA: Diagnosis not present

## 2019-02-27 DIAGNOSIS — Z8249 Family history of ischemic heart disease and other diseases of the circulatory system: Secondary | ICD-10-CM | POA: Diagnosis not present

## 2019-02-27 DIAGNOSIS — Z6827 Body mass index (BMI) 27.0-27.9, adult: Secondary | ICD-10-CM | POA: Diagnosis not present

## 2019-02-27 DIAGNOSIS — Z87891 Personal history of nicotine dependence: Secondary | ICD-10-CM | POA: Diagnosis not present

## 2019-02-27 DIAGNOSIS — G4733 Obstructive sleep apnea (adult) (pediatric): Secondary | ICD-10-CM | POA: Diagnosis not present

## 2019-02-27 DIAGNOSIS — I4819 Other persistent atrial fibrillation: Secondary | ICD-10-CM

## 2019-02-27 DIAGNOSIS — N183 Chronic kidney disease, stage 3 (moderate): Secondary | ICD-10-CM | POA: Insufficient documentation

## 2019-02-27 DIAGNOSIS — M109 Gout, unspecified: Secondary | ICD-10-CM | POA: Diagnosis not present

## 2019-02-27 DIAGNOSIS — R000 Tachycardia, unspecified: Secondary | ICD-10-CM | POA: Diagnosis not present

## 2019-02-27 DIAGNOSIS — E1122 Type 2 diabetes mellitus with diabetic chronic kidney disease: Secondary | ICD-10-CM | POA: Insufficient documentation

## 2019-02-27 DIAGNOSIS — I5042 Chronic combined systolic (congestive) and diastolic (congestive) heart failure: Secondary | ICD-10-CM | POA: Insufficient documentation

## 2019-02-27 DIAGNOSIS — Z7984 Long term (current) use of oral hypoglycemic drugs: Secondary | ICD-10-CM | POA: Insufficient documentation

## 2019-02-27 DIAGNOSIS — N400 Enlarged prostate without lower urinary tract symptoms: Secondary | ICD-10-CM | POA: Insufficient documentation

## 2019-02-27 DIAGNOSIS — Z7901 Long term (current) use of anticoagulants: Secondary | ICD-10-CM | POA: Diagnosis not present

## 2019-02-27 DIAGNOSIS — E785 Hyperlipidemia, unspecified: Secondary | ICD-10-CM | POA: Insufficient documentation

## 2019-02-27 DIAGNOSIS — I429 Cardiomyopathy, unspecified: Secondary | ICD-10-CM | POA: Diagnosis not present

## 2019-02-27 DIAGNOSIS — N189 Chronic kidney disease, unspecified: Secondary | ICD-10-CM | POA: Diagnosis not present

## 2019-02-27 DIAGNOSIS — I130 Hypertensive heart and chronic kidney disease with heart failure and stage 1 through stage 4 chronic kidney disease, or unspecified chronic kidney disease: Secondary | ICD-10-CM | POA: Diagnosis not present

## 2019-02-27 DIAGNOSIS — I480 Paroxysmal atrial fibrillation: Secondary | ICD-10-CM | POA: Diagnosis not present

## 2019-02-27 DIAGNOSIS — Z882 Allergy status to sulfonamides status: Secondary | ICD-10-CM | POA: Diagnosis not present

## 2019-02-27 HISTORY — PX: CARDIOVERSION: SHX1299

## 2019-02-27 LAB — GLUCOSE, CAPILLARY: Glucose-Capillary: 109 mg/dL — ABNORMAL HIGH (ref 70–99)

## 2019-02-27 SURGERY — CARDIOVERSION
Anesthesia: General

## 2019-02-27 MED ORDER — PROPOFOL 10 MG/ML IV BOLUS
INTRAVENOUS | Status: DC | PRN
  Administered 2019-02-27: 70 mg via INTRAVENOUS

## 2019-02-27 MED ORDER — SODIUM CHLORIDE 0.9% FLUSH: 3.0000 mL | INTRAVENOUS | Status: DC | PRN

## 2019-02-27 MED ORDER — PHENYLEPHRINE 40 MCG/ML (10ML) SYRINGE FOR IV PUSH (FOR BLOOD PRESSURE SUPPORT)
PREFILLED_SYRINGE | INTRAVENOUS | Status: DC | PRN
  Administered 2019-02-27 (×2): 80 ug via INTRAVENOUS
  Administered 2019-02-27: 120 ug via INTRAVENOUS

## 2019-02-27 MED ORDER — SODIUM CHLORIDE 0.9 % IV SOLN
250.0000 mL | INTRAVENOUS | Status: DC
  Administered 2019-02-27 (×2): via INTRAVENOUS

## 2019-02-27 MED ORDER — SODIUM CHLORIDE 0.9% FLUSH: 3.0000 mL | Freq: Two times a day (BID) | INTRAVENOUS | Status: DC

## 2019-02-27 MED ORDER — LIDOCAINE HCL 1 % IJ SOLN
INTRAMUSCULAR | Status: DC | PRN
  Administered 2019-02-27: 100 mg via INTRADERMAL

## 2019-02-27 MED ORDER — EPHEDRINE SULFATE-NACL 50-0.9 MG/10ML-% IV SOSY
PREFILLED_SYRINGE | INTRAVENOUS | Status: DC | PRN
  Administered 2019-02-27 (×2): 10 mg via INTRAVENOUS

## 2019-02-27 NOTE — Transfer of Care (Signed)
Immediate Anesthesia Transfer of Care Note  Patient: [name]  Procedure(s) Performed: CARDIOVERSION (N/A )  Patient Location: Endoscopy Unit  Anesthesia Type:General  Level of Consciousness: awake, alert  and oriented  Airway & Oxygen Therapy: Patient Spontanous Breathing  Post-op Assessment: Report given to RN, Post -op Vital signs reviewed and stable and Patient moving all extremities X 4  Post vital signs: Reviewed and stable  Last Vitals:  Vitals Value Taken Time  BP 102/72 02/27/19 1404  Temp    Pulse 88 02/27/19 1405  Resp 31 02/27/19 1405  SpO2 99 % 02/27/19 1405  Vitals shown include unvalidated device data.  Last Pain:  Vitals:   02/27/19 1340  TempSrc: Temporal  PainSc:          Complications: No apparent anesthesia complications

## 2019-02-27 NOTE — Interval H&P Note (Signed)
History and Physical Interval Note:  02/27/2019 1:27 PM  [name]  has presented today for surgery, with the diagnosis of AFIB.  The various methods of treatment have been discussed with the patient and family. After consideration of risks, benefits and other options for treatment, the patient has consented to  Procedure(s): CARDIOVERSION (N/A) as a surgical intervention.  The patient's history has been reviewed, patient examined, no change in status, stable for surgery.  I have reviewed the patient's chart and labs.  Questions were answered to the patient's satisfaction.     [name]

## 2019-02-27 NOTE — Anesthesia Postprocedure Evaluation (Signed)
Anesthesia Post Note  Patient: [name]  Procedure(s) Performed: CARDIOVERSION (N/A )     Patient location during evaluation: PACU Anesthesia Type: General Level of consciousness: sedated Pain management: pain level controlled Vital Signs Assessment: post-procedure vital signs reviewed and stable Respiratory status: spontaneous breathing and respiratory function stable Cardiovascular status: stable Postop Assessment: no apparent nausea or vomiting Anesthetic complications: no    Last Vitals:  Vitals:   02/27/19 1405 02/27/19 1415  BP: 102/72 106/69  Pulse: 87 87  Resp: 18 (!) 33  Temp: 36.6 C   SpO2: 95% 94%    Last Pain:  Vitals:   02/27/19 1415  TempSrc:   PainSc: 0-No pain                 Eldar Robitaille DANIEL

## 2019-02-27 NOTE — Op Note (Signed)
Procedure: Electrical Cardioversion Indications:  Atrial Fibrillation  Procedure Details:  Consent: Risks of procedure as well as the alternatives and risks of each were explained to the (patient/caregiver).  Consent for procedure obtained.  Time Out: Verified patient identification, verified procedure, site/side was marked, verified correct patient position, special equipment/implants available, medications/allergies/relevent history reviewed, required imaging and test results available.  Performed  Patient placed on cardiac monitor, pulse oximetry, supplemental oxygen as necessary.  Sedation given: Propofol 70 mg IV, Dr. Tobias Alexander Pacer pads placed anterior and posterior chest.  Cardioverted 1 time(s).  Cardioversion with synchronized biphasic 150J shock.  Evaluation: Findings: Post procedure EKG shows: NSR Complications: transient hypotension Patient did tolerate procedure well.  Time Spent Directly with the Patient:  30 minutes   Nowell Sites 02/27/2019, 1:55 PM

## 2019-02-27 NOTE — Anesthesia Preprocedure Evaluation (Signed)
Anesthesia Evaluation  Patient identified by MRN, date of birth, ID band Patient awake    Reviewed: Allergy & Precautions, NPO status , Patient's Chart, lab work & pertinent test results  Airway Mallampati: II  TM Distance: >3 FB Neck ROM: Full    Dental  (+) Teeth Intact, Dental Advisory Given   Pulmonary sleep apnea and Continuous Positive Airway Pressure Ventilation , former smoker,    breath sounds clear to auscultation       Cardiovascular hypertension, Pt. on home beta blockers + Peripheral Vascular Disease and +CHF  + dysrhythmias Atrial Fibrillation + Valvular Problems/Murmurs AS  Rhythm:Irregular Rate:Abnormal     Neuro/Psych Anxiety  Neuromuscular disease    GI/Hepatic negative GI ROS, Neg liver ROS,   Endo/Other  diabetes, Type 2, Oral Hypoglycemic Agents  Renal/GU CRFRenal disease     Musculoskeletal   Abdominal Normal abdominal exam  (+)   Peds  Hematology   Anesthesia Other Findings   Reproductive/Obstetrics                             Lab Results  Component Value Date   WBC 4.1 02/23/2019   HGB 13.2 02/23/2019   HCT 41.3 02/23/2019   MCV 83 02/23/2019   PLT 189 02/23/2019   Lab Results  Component Value Date   INR 2.2 09/06/2018   INR 2.5 07/25/2018   INR 2.6 06/09/2018     Anesthesia Physical  Anesthesia Plan  ASA: III  Anesthesia Plan: General   Post-op Pain Management:    Induction: Intravenous  PONV Risk Score and Plan: 2 and Propofol infusion  Airway Management Planned: Natural Airway and Mask  Additional Equipment: None  Intra-op Plan:   Post-operative Plan:   Informed Consent: I have reviewed the patients History and Physical, chart, labs and discussed the procedure including the risks, benefits and alternatives for the proposed anesthesia with the patient or authorized representative who has indicated his/her understanding and acceptance.      Dental advisory given  Plan Discussed with: CRNA  Anesthesia Plan Comments:         Anesthesia Quick Evaluation

## 2019-02-27 NOTE — Anesthesia Procedure Notes (Signed)
Procedure Name: General with mask airway Date/Time: 02/27/2019 1:44 PM Performed by: Kyung Rudd, CRNA Pre-anesthesia Checklist: Patient identified, Emergency Drugs available, Suction available, Patient being monitored and Timeout performed Patient Re-evaluated:Patient Re-evaluated prior to induction Oxygen Delivery Method: Ambu bag Preoxygenation: Pre-oxygenation with 100% oxygen Induction Type: IV induction Ventilation: Mask ventilation without difficulty Dental Injury: Teeth and Oropharynx as per pre-operative assessment

## 2019-02-28 ENCOUNTER — Ambulatory Visit: Payer: Medicare Other | Admitting: Cardiovascular Disease

## 2019-02-28 ENCOUNTER — Encounter (HOSPITAL_COMMUNITY): Payer: Self-pay | Admitting: Cardiovascular Disease

## 2019-03-11 ENCOUNTER — Other Ambulatory Visit: Payer: Self-pay | Admitting: Cardiology

## 2019-03-15 ENCOUNTER — Other Ambulatory Visit: Payer: Self-pay | Admitting: Cardiology

## 2019-03-25 ENCOUNTER — Other Ambulatory Visit: Payer: Self-pay | Admitting: Cardiovascular Disease

## 2019-03-27 ENCOUNTER — Other Ambulatory Visit: Payer: Self-pay

## 2019-03-27 MED ORDER — METOPROLOL SUCCINATE ER 50 MG PO TB24: 150.0000 mg | ORAL_TABLET | Freq: Two times a day (BID) | ORAL | 11 refills | Status: DC

## 2019-03-27 NOTE — Telephone Encounter (Signed)
Rx request sent to pharmacy.  

## 2019-03-30 ENCOUNTER — Ambulatory Visit (INDEPENDENT_AMBULATORY_CARE_PROVIDER_SITE_OTHER): Payer: Medicare Other | Admitting: Cardiovascular Disease

## 2019-03-30 ENCOUNTER — Encounter: Payer: Self-pay | Admitting: Cardiovascular Disease

## 2019-03-30 ENCOUNTER — Other Ambulatory Visit: Payer: Self-pay

## 2019-03-30 VITALS — BP 103/76 | HR 112 | Temp 97.1°F | Ht 71.0 in | Wt 197.6 lb

## 2019-03-30 DIAGNOSIS — E663 Overweight: Secondary | ICD-10-CM | POA: Diagnosis not present

## 2019-03-30 DIAGNOSIS — Z7901 Long term (current) use of anticoagulants: Secondary | ICD-10-CM

## 2019-03-30 DIAGNOSIS — G4733 Obstructive sleep apnea (adult) (pediatric): Secondary | ICD-10-CM | POA: Diagnosis not present

## 2019-03-30 DIAGNOSIS — N1832 Chronic kidney disease, stage 3b: Secondary | ICD-10-CM

## 2019-03-30 DIAGNOSIS — Z79899 Other long term (current) drug therapy: Secondary | ICD-10-CM

## 2019-03-30 DIAGNOSIS — I4819 Other persistent atrial fibrillation: Secondary | ICD-10-CM | POA: Diagnosis not present

## 2019-03-30 DIAGNOSIS — Z5181 Encounter for therapeutic drug level monitoring: Secondary | ICD-10-CM | POA: Diagnosis not present

## 2019-03-30 DIAGNOSIS — I352 Nonrheumatic aortic (valve) stenosis with insufficiency: Secondary | ICD-10-CM

## 2019-03-30 DIAGNOSIS — I5042 Chronic combined systolic (congestive) and diastolic (congestive) heart failure: Secondary | ICD-10-CM | POA: Diagnosis not present

## 2019-03-30 DIAGNOSIS — I10 Essential (primary) hypertension: Secondary | ICD-10-CM | POA: Diagnosis not present

## 2019-03-30 NOTE — Patient Instructions (Signed)
Medication Instructions:  No changes *If you need a refill on your cardiac medications before your next appointment, please call your pharmacy*  Lab Work: None ordered If you have labs (blood work) drawn today and your tests are completely normal, you will receive your results only by: [name] MyChart Message (if you have MyChart) OR . A paper copy in the mail If you have any lab test that is abnormal or we need to change your treatment, we will call you to review the results.  Testing/Procedures: None ordered  Follow-Up: At [facility], you and your health needs are our priority.  As part of our continuing mission to provide you with exceptional heart care, we have created designated Provider Care Teams.  These Care Teams include your primary Cardiologist (physician) and Advanced Practice Providers (APPs -  Physician Assistants and Nurse Practitioners) who all work together to provide you with the care you need, when you need it.  Your next appointment:   3 months  The format for your next appointment:   In Person  Provider:   Sanda Klein, MD

## 2019-03-30 NOTE — Progress Notes (Addendum)
Cardiology Office Note    Date:  03/30/2019   ID:  [name], [name] 1951-04-21, MRN [id number]  PCP:  [name], MD  Cardiologist:   Sanda Klein, MD   No chief complaint on file.   History of Present Illness:  [name] is a 68 y.o. male with aortic insufficiency, combined systolic and diastolic heart failure, paroxysmal atrial fibrillation and atrial flutter, obstructive sleep apnea, essential hypertension dyslipidemia in the setting of obesity and type 2 diabetes mellitus.  He returns in follow-up after cardioversion performed on September 22 for persistent atrial fibrillation rapid ventricular response.  We increased the dose of his dofetilide (we had decreased it due to renal dysfunction, but atrial fibrillation recurred; QT is still in the acceptable range on the higher dose of dofetilide).  Judging by his vital signs, he had at most 2448 hrs. of normal rhythm.  He never noticed an improvement in symptoms.  He has not developed edema and does not have orthopnea or PND, but has NYHA functional class 2-3 exertional dyspnea and fatigue.  He denies falls injuries or bleeding or focal neurological events.  He does not have chest pain.  He reports compliance with CPAP and does not have daytime hypersomnolence.  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  Most recently in January 2019 his ejection fraction dropped to 30-35% when he was unaware of the arrhythmia.  He underwent cardioversion and ranolazine was added to dofetilide with a return to sinus rhythm, improved functional status and improvement in left ventricular ejection fraction to 42% by echo performed in April 2019. In February 2020 due to worsening renal function parameters his ranolazine was stopped and the dose of dofetilide was decreased from 500 mcg to 250 mcg twice daily.  He did not have excessive QT prolongation (QTC 473 ms at the time).  He did not have evidence of  ventricular proarrhythmia.  In early 2020 he had an upper respiratory infection from which he never felt that he fully rebounded.  He noticed persistent tachycardia throughout the summer 2020 he underwent successful cardioversion on February 27, 2019 but had early recurrence of atrial fibrillation.  His electrocardiogram today shows atrial flutter with variable AV block and a ventricular rate of about 113 bpm.  It is hard to accurately measure his QT due to the flutter waves but is probably around 380-400 ms (500-520 ms corrected for heart rate). He has continued to slowly lose weight.  He is compliant with CPAP 100% of the time and denies daytime hypersomnolence.    His echocardiograms have shown moderate aortic insufficiency related to aortic ectasia.  He has a long-standing history of severe hypertension. He also has long-standing history of obstructive sleep apnea which was also not treated until recently, but he is now 100% CPAP-compliant. Coronary angiography 2011 did not show evidence of significant stenoses. He presented with typical atrial flutter in 2011 and has recurrent paroxysmal atrial fibrillation with a good response to treatment with dofetilide, but the dose of dofetilide was decreased when his renal function deteriorated in 2020. Additional problems include obesity, type 2 diabetes mellitus, gout and androgen deficiency. He had a successful cardioversion in October 2016 and February 2019 for persistent atrial fibrillation.   Past Medical History:  Diagnosis Date   AORTIC STENOSIS, MODERATE 12/05/2009   Atrial fibrillation (HCC)    Atrial fibrillation with RVR ([place]) 07/05/2017   Atrial flutter ([place]) 12/05/2009   Atypical chest pain 11/08/2011  BENIGN PROSTATIC HYPERTROPHY, HX OF 12/05/2009   CHEST PAIN-UNSPECIFIED 11/11/2009   CHF 12/05/2009   Chronic renal insufficiency 12/16/2016   Debility 04/18/2012   Diarrhea 12/16/2016   DYSPNEA 12/19/2009   Edema 04/18/2012   ERECTILE  DYSFUNCTION 01/25/2007   FATIGUE, CHRONIC 11/11/2009   Gout 07/10/2012   Heart murmur    History of kidney stones    "they passed" (07/05/2017)   Hyperkalemia 04/18/2012   Hyperlipidemia    Hypertension    HYPERTENSION 01/25/2007   Hypoxia 05/05/2011   Insomnia 05/13/2016   INSOMNIA, HX OF 01/25/2007   Knee pain, bilateral 12/28/2010   Knee pain, right 12/28/2010   NEUROMA 01/05/2010   OBESITY NOS 01/25/2007   OSA on CPAP 04/06/2010   PERIPHERAL NEUROPATHY 01/05/2010   PULMONARY FUNCTION TESTS, ABNORMAL 02/02/2010   Superficial thrombophlebitis of left leg 09/07/2011   TESTICULAR HYPOFUNCTION 01/05/2010   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   Toe pain, right 07/10/2012   Type II diabetes mellitus ([place]) 12/05/2009   UNSPECIFIED ANEMIA 01/05/2010    Past Surgical History:  Procedure Laterality Date   CARDIAC CATHETERIZATION  10/16/2009   nonischemic cardiomyopathy   CARDIOVERSION N/A 03/25/2015   Procedure: CARDIOVERSION;  Surgeon: Pixie Casino, MD;  Location[facility] ENDOSCOPY;  Service: Cardiovascular;  Laterality: N/A;   CARDIOVERSION N/A 06/17/2017   Procedure: CARDIOVERSION;  Surgeon: Sanda Klein, MD;  Location: [facility] ENDOSCOPY;  Service: Cardiovascular;  Laterality: N/A;   CARDIOVERSION N/A 07/08/2017   Procedure: CARDIOVERSION;  Surgeon: Fay Records, MD;  Location: [facility] ENDOSCOPY;  Service: Cardiovascular;  Laterality: N/A;   CARDIOVERSION N/A 02/27/2019   Procedure: CARDIOVERSION;  Surgeon: Sanda Klein, MD;  Location: [facility] ENDOSCOPY;  Service: Cardiovascular;  Laterality: N/A;   METATARSAL OSTEOTOMY Bilateral ~ 1980   removed part of 5th metatarsal to corect curvature of toes     Current Medications: Outpatient Medications Prior to Visit  Medication Sig Dispense Refill   albuterol (VENTOLIN HFA) 108 (90 Base) MCG/ACT inhaler Inhale 2 puffs into the lungs every 6 (six) hours as needed for wheezing or shortness of breath. 1 Inhaler 6   allopurinol (ZYLOPRIM) 300 MG tablet  Take 1 tablet by mouth once daily (Patient taking differently: Take 300 mg by mouth at bedtime. ) 90 tablet 0   benazepril (LOTENSIN) 5 MG tablet Take 1/2 (one-half) tablet by mouth once daily 45 tablet 3   Blood Glucose Monitoring Suppl (ONETOUCH VERIO) w/Device KIT Use to check blood sugar TID as directed.  Dx Code: E11.9 1 kit 0   calcium carbonate (TUMS - DOSED IN MG ELEMENTAL CALCIUM) 500 MG chewable tablet Chew 1 tablet by mouth 3 (three) times daily as needed (heartburn/indigestion.).      cetirizine (ZYRTEC) 10 MG tablet Take 1 tablet (10 mg total) by mouth daily. 30 tablet 11   Clobetasol Prop Emollient Base (CLOBETASOL PROPIONATE E) 0.05 % emollient cream Apply 1 application topically 2 (two) times daily as needed (insect bite). 60 g 1   clotrimazole-betamethasone (LOTRISONE) cream Apply 1 application topically 2 (two) times daily. (Patient taking differently: Apply 1 application topically 2 (two) times daily as needed (skin irritation.). ) 30 g 1   colchicine (COLCRYS) 0.6 MG tablet TAKE ONE TABLET BY MOUTH DAILY AS NEEDED FOR GOUT FLARE UPS. (Patient taking differently: Take 0.6 mg by mouth daily as needed (gout flare ups). ) 30 tablet 0   dofetilide (TIKOSYN) 500 MCG capsule Take 1 capsule (500 mcg total) by mouth every 12 (twelve) hours. 60 capsule  ELIQUIS 5 MG TABS tablet Take 1 tablet by mouth twice daily 60 tablet 10   fish oil-omega-3 fatty acids 1000 MG capsule Take 1 g by mouth daily at 12 noon.      furosemide (LASIX) 40 MG tablet TAKE TWO TABLETS BY MOUTH EVERY MORNING AND TAKE ONE TABLET BY MOUTH EVERY AFTERNOON 270 tablet 0   glucose blood test strip (Verio) Use as instructed Check blood sugars TID DxE11.9 100 each 12   glyBURIDE (DIABETA) 5 MG tablet Take 1 tablet by mouth once daily with breakfast (Patient taking differently: Take 5 mg by mouth daily with breakfast. ) 90 tablet 0   HYDROcodone-acetaminophen (NORCO) 10-325 MG tablet Take 1 tablet by mouth every  8 (eight) hours as needed for moderate pain or severe pain. 90 tablet 0   LORazepam (ATIVAN) 1 MG tablet Take 1 tablet (1 mg total) by mouth every 8 (eight) hours as needed for anxiety. 30 tablet 5   magnesium oxide (MAG-OX) 400 MG tablet Take 1 tablet (400 mg total) by mouth daily. (Patient taking differently: Take 400 mg by mouth daily at 12 noon. ) 30 tablet 11   metFORMIN (GLUCOPHAGE) 500 MG tablet TAKE 1 TABLET BY MOUTH THREE TIMES DAILY (Patient taking differently: Take 500 mg by mouth 3 (three) times daily. ) 270 tablet 0   metoprolol succinate (TOPROL-XL) 100 MG 24 hr tablet Take 1 tablet (100 mg total) by mouth 2 (two) times daily. 180 tablet 0   metoprolol succinate (TOPROL-XL) 50 MG 24 hr tablet Take 3 tablets (150 mg total) by mouth 2 (two) times daily. Take with or immediately following a meal. 90 tablet 11   Multiple Vitamin (MULTIVITAMIN WITH MINERALS) TABS tablet Take 1 tablet by mouth daily at 12 noon.     potassium chloride SA (K-DUR,KLOR-CON) 20 MEQ tablet TAKE 1 TABLET BY MOUTH ONCE DAILY AND  EXTRA  TABLET  IF  WEIGHT  IS  ABOVE  210  LBS (Patient taking differently: Take 20 mEq by mouth daily. ) 180 tablet 2   pravastatin (PRAVACHOL) 40 MG tablet Take 1 tablet by mouth once daily 90 tablet 3   temazepam (RESTORIL) 30 MG capsule Take 1 capsule (30 mg total) by mouth at bedtime. 30 capsule 5   No facility-administered medications prior to visit.      Allergies:   Sulfonamide derivatives and Zolpidem   Social History   Socioeconomic History   Marital status: Married    Spouse name: Not on file   Number of children: 4   Years of education: 12   Highest education level: High school graduate  Occupational History   Occupation: retired    Fish farm manager: [employer]  Scientist, product/process development strain: Not on file   Food insecurity    Worry: Not on file    Inability: Not on file   Transportation needs    Medical: Not on file    Non-medical: Not on file    Tobacco Use   Smoking status: Former Smoker    Packs/day: 1.00    Years: 10.00    Pack years: 10.00    Quit date: 06/07/1980    Years since quitting: 38.8   Smokeless tobacco: Never Used  Substance and Sexual Activity   Alcohol use: No   Drug use: No   Sexual activity: Not Currently  Lifestyle   Physical activity    Days per week: Not on file    Minutes per session: Not on file  Stress: Not on file  Relationships   Social connections    Talks on phone: Not on file    Gets together: Not on file    Attends religious service: Not on file    Active member of club or organization: Not on file    Attends meetings of clubs or organizations: Not on file    Relationship status: Not on file  Other Topics Concern   Not on file  Social History Narrative   Lives with male partner in a one story home.  His son lives there off and on.  Retired from [employer].  Education: high school.       Family History:  The patient's family history includes Aneurysm in his sister; Cancer in his father; Clotting disorder in his mother; Diabetes in his mother and sister; Heart attack in his mother; Heart disease in his mother; Hyperlipidemia in his mother; Hypertension in his mother and sister; Leukemia in his maternal grandmother; Lung disease in his father; Other in his sister; Seizures in his sister; Stroke in his mother.   ROS:   Please see the history of present illness.    ROS All other systems are reviewed and are negative.   PHYSICAL EXAM:   VS:  BP 103/76    Pulse (!) 112    Temp (!) 97.1 F (36.2 C)    Ht '5\' 11"'$  (1.803 m)    Wt 197 lb 9.6 oz (89.6 kg)    SpO2 100%    BMI 27.56 kg/m       General: Alert, oriented x3, no distress, overweight, but not obese Head: no evidence of trauma, PERRL, EOMI, no exophtalmos or lid lag, no myxedema, no xanthelasma; normal ears, nose and oropharynx Neck: normal jugular venous pulsations and no hepatojugular reflux; brisk carotid pulses without  delay and no carotid bruits Chest: clear to auscultation, no signs of consolidation by percussion or palpation, normal fremitus, symmetrical and full respiratory excursions Cardiovascular: normal position and quality of the apical impulse, irregular rhythm, normal first and second heart sounds, 2/6 decrescendo diastolic murmur at the left lower sternal border, no rubs or gallops Abdomen: no tenderness or distention, no masses by palpation, no abnormal pulsatility or arterial bruits, normal bowel sounds, no hepatosplenomegaly Extremities: no clubbing, cyanosis or edema; 2+ radial, ulnar and brachial pulses bilaterally; 2+ right femoral, posterior tibial and dorsalis pedis pulses; 2+ left femoral, posterior tibial and dorsalis pedis pulses; no subclavian or femoral bruits Neurological: grossly nonfocal Psych: Normal mood and affect    Wt Readings from Last 3 Encounters:  03/30/19 197 lb 9.6 oz (89.6 kg)  02/27/19 189 lb 3.2 oz (85.8 kg)  02/15/19 196 lb 3.2 oz (89 kg)   Studies/Labs Reviewed:   EKG:  EKG is ordered today.  Shows atrial flutter with variable AV block and rapid ventricular response, the QRS is borderline prolonged at about 100 ms, QTC hard to measure accurately probably around 500 ms  Lipid Panel    Component Value Date/Time   CHOL 116 02/06/2019 1217   TRIG 102.0 02/06/2019 1217   HDL 32.70 (L) 02/06/2019 1217   CHOLHDL 4 02/06/2019 1217   VLDL 20.4 02/06/2019 1217   LDLCALC 63 02/06/2019 1217   LDLDIRECT 84.0 05/02/2018 1523     ASSESSMENT:    1. Chronic combined systolic and diastolic heart failure (HCC)   2. Persistent atrial fibrillation ([place])   3. Nonrheumatic aortic insufficiency with aortic stenosis   4. OSA (obstructive sleep apnea)  5. Overweight   6. Essential hypertension   7. Long term current use of anticoagulant   8. Encounter for monitoring dofetilide therapy   9. Chronic renal failure, stage 3b      PLAN:  In order of problems listed  above:  1. CHF (predominantly diastolic): As far as I can tell he is clinically euvolemic, but his functional status is a little worse.  I am concerned that he is developing tachycardia cardiomyopathy again.  He is receiving a very low-dose of ACE inhibitor and a very high dose of beta-blocker in an attempt to maintain rate control. 2. AFib:   Failed antiarrhythmic therapy despite maximum dose dofetilide (in spite of renal dysfunction).   Choices at this point include alternative antiarrhythmic therapy (amiodarone is the only real option), A. fib/a flutter ablation with continued dofetilide therapy or AV node ablation with permanent pacemaker implantation (probably CRT). He had a history of poor GI tolerance of amiodarone when treated with a loading dose. We could try amiodarone again, without a loading dose to see if he has better GI tolerability.   Will arrange formal EP consultation.  Had a telephone discussion with Dr. Curt Bears today.  We will keep him on dofetilide and evaluate him for atrial fibrillation ablation.  Reminded the patient of the risk of  multiple drug interactions with dofetilide. CHADSVAsc 3 (HTN, CHF, DM) on uninterrupted anticoagulation. 3. Moderate AI: Repeat echo in the near future.  On multiple echoes this has always been moderate, never appeared severe enough to be the cause of cardiomyopathy. 4. OSA: He reports 100% compliance with CPAP.  Denies daytime hypersomnolence. 5.  Overweight: He has done an excellent job with weight loss. 6. HTN: Very well controlled.  His blood pressure does not allow a higher dose of beta-blocker and his renal function does not permit additional rate control medications 7. Eliquis: Excellent compliance with follow-up, 8. Tikosyn: QT is borderline prolonged.  In theory, he should be on a lower dose of dofetilide for his renal function, but when we checked his QT in normal sinus rhythm after his cardioversion it was acceptable at 486 ms. 9. CKD:  Although he has CKD stage IIIb he seems to have a stable GFR around 40, unchanged over the last 6 to 7 months.  May have to decrease his dofetilide dose again if renal function deteriorates further.  Medication Adjustments/Labs and Tests Ordered: Current medicines are reviewed at length with the patient today.  Concerns regarding medicines are outlined above.  Medication changes, Labs and Tests ordered today are listed in the Patient Instructions below. Patient Instructions  Medication Instructions:  No changes *If you need a refill on your cardiac medications before your next appointment, please call your pharmacy*  Lab Work: None ordered If you have labs (blood work) drawn today and your tests are completely normal, you will receive your results only by:  [place] (if you have MyChart) OR  A paper copy in the mail If you have any lab test that is abnormal or we need to change your treatment, we will call you to review the results.  Testing/Procedures: None ordered  Follow-Up: At [facility], you and your health needs are our priority.  As part of our continuing mission to provide you with exceptional heart care, we have created designated Provider Care Teams.  These Care Teams include your primary Cardiologist (physician) and Advanced Practice Providers (APPs -  Physician Assistants and Nurse Practitioners) who all work together to provide you  with the care you need, when you need it.  Your next appointment:   3 months  The format for your next appointment:   In Person  Provider:   Sanda Klein, MD       Signed, Sanda Klein, MD  03/30/2019 1:11 PM    [facility] [place], [place], [place]  [address] Phone: [phone]; Fax: [phone]

## 2019-04-01 ENCOUNTER — Other Ambulatory Visit: Payer: Self-pay | Admitting: Family Medicine

## 2019-04-01 DIAGNOSIS — M25561 Pain in right knee: Secondary | ICD-10-CM

## 2019-04-01 DIAGNOSIS — I10 Essential (primary) hypertension: Secondary | ICD-10-CM

## 2019-04-01 DIAGNOSIS — E1169 Type 2 diabetes mellitus with other specified complication: Secondary | ICD-10-CM

## 2019-04-01 DIAGNOSIS — E669 Obesity, unspecified: Secondary | ICD-10-CM

## 2019-04-01 DIAGNOSIS — Z7901 Long term (current) use of anticoagulants: Secondary | ICD-10-CM

## 2019-04-01 DIAGNOSIS — G4700 Insomnia, unspecified: Secondary | ICD-10-CM

## 2019-04-01 DIAGNOSIS — M25562 Pain in left knee: Secondary | ICD-10-CM

## 2019-04-01 DIAGNOSIS — F411 Generalized anxiety disorder: Secondary | ICD-10-CM

## 2019-04-01 DIAGNOSIS — H409 Unspecified glaucoma: Secondary | ICD-10-CM

## 2019-04-01 DIAGNOSIS — E875 Hyperkalemia: Secondary | ICD-10-CM

## 2019-04-01 DIAGNOSIS — E785 Hyperlipidemia, unspecified: Secondary | ICD-10-CM

## 2019-04-01 DIAGNOSIS — D649 Anemia, unspecified: Secondary | ICD-10-CM

## 2019-04-03 ENCOUNTER — Ambulatory Visit (INDEPENDENT_AMBULATORY_CARE_PROVIDER_SITE_OTHER): Payer: Medicare Other | Admitting: Cardiology

## 2019-04-03 ENCOUNTER — Other Ambulatory Visit: Payer: Self-pay

## 2019-04-03 ENCOUNTER — Encounter: Payer: Self-pay | Admitting: Cardiology

## 2019-04-03 VITALS — BP 108/72 | HR 125 | Ht 71.0 in | Wt 196.8 lb

## 2019-04-03 DIAGNOSIS — I480 Paroxysmal atrial fibrillation: Secondary | ICD-10-CM | POA: Diagnosis not present

## 2019-04-03 DIAGNOSIS — Z01812 Encounter for preprocedural laboratory examination: Secondary | ICD-10-CM

## 2019-04-03 NOTE — Progress Notes (Signed)
Electrophysiology Office Note   Date:  04/03/2019   ID:  [name] Jul 01, 1950, MRN [id number]  PCP:  [name], MD  Cardiologist:  [name] Primary Electrophysiologist:  Kevin Duey Meredith [name], MD    No chief complaint on file.    History of Present Illness: [name] is a 68 y.o. male who is being seen today for the evaluation of atrial fibrillation at the request of [name]. Presenting today for electrophysiology evaluation.  He has a history of atrial fibrillation, atrial flutter, aortic insufficiency, hypertension, OSA, hyperlipidemia, type 2 diabetes, and diastolic heart failure.   He had cardioversion 02/27/2019.  Based on his symptoms, he was in sinus rhythm for 24 to 48 hours.  He has developed NYHA class II-III exertional dyspnea and fatigue.  He does not have chest pain.  He does report compliance with CPAP.  ECG previously showed atrial flutter with a heart rate of 113.  Today, denies symptoms of palpitations, chest pain, shortness of breath, orthopnea, PND, lower extremity edema, claudication, dizziness, presyncope, syncope, bleeding, or neurologic sequela. The patient is tolerating medications without difficulties.     Past Medical History:  Diagnosis Date  . AORTIC STENOSIS, MODERATE 12/05/2009  . Atrial fibrillation ([place])   . Atrial fibrillation with RVR ([name]) 07/05/2017  . Atrial flutter ([name]) 12/05/2009  . Atypical chest pain 11/08/2011  . BENIGN PROSTATIC HYPERTROPHY, HX OF 12/05/2009  . CHEST PAIN-UNSPECIFIED 11/11/2009  . CHF 12/05/2009  . Chronic renal insufficiency 12/16/2016  . Debility 04/18/2012  . Diarrhea 12/16/2016  . DYSPNEA 12/19/2009  . Edema 04/18/2012  . ERECTILE DYSFUNCTION 01/25/2007  . FATIGUE, CHRONIC 11/11/2009  . Gout 07/10/2012  . Heart murmur   . History of kidney stones    "they passed" (07/05/2017)  . Hyperkalemia 04/18/2012  . Hyperlipidemia   . Hypertension   . HYPERTENSION 01/25/2007  . Hypoxia 05/05/2011  .  Insomnia 05/13/2016  . INSOMNIA, HX OF 01/25/2007  . Knee pain, bilateral 12/28/2010  . Knee pain, right 12/28/2010  . NEUROMA 01/05/2010  . OBESITY NOS 01/25/2007  . OSA on CPAP 04/06/2010  . PERIPHERAL NEUROPATHY 01/05/2010  . PULMONARY FUNCTION TESTS, ABNORMAL 02/02/2010  . Superficial thrombophlebitis of left leg 09/07/2011  . TESTICULAR HYPOFUNCTION 01/05/2010  . 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 Toe pain, right 07/10/2012  . Type II diabetes mellitus ([name]) 12/05/2009  . UNSPECIFIED ANEMIA 01/05/2010   Past Surgical History:  Procedure Laterality Date  . CARDIAC CATHETERIZATION  10/16/2009   nonischemic cardiomyopathy  . CARDIOVERSION N/A 03/25/2015   Procedure: CARDIOVERSION;  Surgeon: Pixie Casino, MD;  Location: [facility];  Service: Cardiovascular;  Laterality: N/A;  . CARDIOVERSION N/A 06/17/2017   Procedure: CARDIOVERSION;  Surgeon: Sanda Klein, MD;  Location: [facility] ENDOSCOPY;  Service: Cardiovascular;  Laterality: N/A;  . CARDIOVERSION N/A 07/08/2017   Procedure: CARDIOVERSION;  Surgeon: Fay Records, MD;  Location: [facility] ENDOSCOPY;  Service: Cardiovascular;  Laterality: N/A;  . CARDIOVERSION N/A 02/27/2019   Procedure: CARDIOVERSION;  Surgeon: Sanda Klein, MD;  Location: [facility] ENDOSCOPY;  Service: Cardiovascular;  Laterality: N/A;  . METATARSAL OSTEOTOMY Bilateral ~ 1980   removed part of 5th metatarsal to corect curvature of toes      Current Outpatient Medications  Medication Sig Dispense Refill  . albuterol (VENTOLIN HFA) 108 (90 Base) MCG/ACT inhaler Inhale 2 puffs into the lungs every 6 (six) hours as needed for wheezing or shortness of breath. 1 Inhaler 6  . allopurinol (ZYLOPRIM) 300 MG tablet  Take 1 tablet by mouth once daily 90 tablet 0  . benazepril (LOTENSIN) 5 MG tablet Take 1/2 (one-half) tablet by mouth once daily 45 tablet 3  . Blood Glucose Monitoring Suppl (ONETOUCH VERIO) w/Device KIT Use to check blood sugar TID as directed.  Dx Code: E11.9 1 kit 0  . calcium carbonate  (TUMS - DOSED IN MG ELEMENTAL CALCIUM) 500 MG chewable tablet Chew 1 tablet by mouth 3 (three) times daily as needed (heartburn/indigestion.).     [name] cetirizine (ZYRTEC) 10 MG tablet Take 1 tablet (10 mg total) by mouth daily. 30 tablet 11  . Clobetasol Prop Emollient Base (CLOBETASOL PROPIONATE E) 0.05 % emollient cream Apply 1 application topically 2 (two) times daily as needed (insect bite). 60 g 1  . clotrimazole-betamethasone (LOTRISONE) cream Apply 1 application topically 2 (two) times daily. 30 g 1  . colchicine (COLCRYS) 0.6 MG tablet TAKE ONE TABLET BY MOUTH DAILY AS NEEDED FOR GOUT FLARE UPS. 30 tablet 0  . dofetilide (TIKOSYN) 500 MCG capsule Take 1 capsule (500 mcg total) by mouth every 12 (twelve) hours. 60 capsule   . ELIQUIS 5 MG TABS tablet Take 1 tablet by mouth twice daily 60 tablet 10  . fish oil-omega-3 fatty acids 1000 MG capsule Take 1 g by mouth daily at 12 noon.     . furosemide (LASIX) 40 MG tablet TAKE TWO TABLETS BY MOUTH EVERY MORNING AND TAKE ONE TABLET BY MOUTH EVERY AFTERNOON 270 tablet 0  . glucose blood test strip (Verio) Use as instructed Check blood sugars TID DxE11.9 100 each 12  . glyBURIDE (DIABETA) 5 MG tablet Take 1 tablet by mouth once daily with breakfast 90 tablet 0  . HYDROcodone-acetaminophen (NORCO) 10-325 MG tablet Take 1 tablet by mouth every 8 (eight) hours as needed for moderate pain or severe pain. 90 tablet 0  . LORazepam (ATIVAN) 1 MG tablet Take 1 tablet (1 mg total) by mouth every 8 (eight) hours as needed for anxiety. 30 tablet 5  . magnesium oxide (MAG-OX) 400 MG tablet Take 1 tablet (400 mg total) by mouth daily. 30 tablet 11  . metFORMIN (GLUCOPHAGE) 500 MG tablet TAKE 1 TABLET BY MOUTH THREE TIMES DAILY 270 tablet 0  . metoprolol succinate (TOPROL-XL) 100 MG 24 hr tablet Take 1 tablet (100 mg total) by mouth 2 (two) times daily. 180 tablet 0  . metoprolol succinate (TOPROL-XL) 50 MG 24 hr tablet Take 3 tablets (150 mg total) by mouth 2 (two)  times daily. Take with or immediately following a meal. 90 tablet 11  . Multiple Vitamin (MULTIVITAMIN WITH MINERALS) TABS tablet Take 1 tablet by mouth daily at 12 noon.    . naproxen (NAPROSYN) 375 MG tablet Take 375 mg by mouth as needed.    . potassium chloride SA (K-DUR,KLOR-CON) 20 MEQ tablet TAKE 1 TABLET BY MOUTH ONCE DAILY AND  EXTRA  TABLET  IF  WEIGHT  IS  ABOVE  210  LBS 180 tablet 2  . pravastatin (PRAVACHOL) 40 MG tablet Take 1 tablet by mouth once daily 90 tablet 3  . temazepam (RESTORIL) 30 MG capsule Take 1 capsule (30 mg total) by mouth at bedtime. 30 capsule 5   No current facility-administered medications for this visit.     Allergies:   Sulfonamide derivatives and Zolpidem   Social History:  The patient  reports that he quit smoking about 38 years ago. He has a 10.00 pack-year smoking history. He has never used smokeless tobacco. He  reports that he does not drink alcohol or use drugs.   Family History:  The patient's family history includes Aneurysm in his sister; Cancer in his father; Clotting disorder in his mother; Diabetes in his mother and sister; Heart attack in his mother; Heart disease in his mother; Hyperlipidemia in his mother; Hypertension in his mother and sister; Leukemia in his maternal grandmother; Lung disease in his father; Other in his sister; Seizures in his sister; Stroke in his mother.    ROS:  Please see the history of present illness.   Otherwise, review of systems is positive for none.   All other systems are reviewed and negative.   PHYSICAL EXAM: VS:  BP 108/72   Pulse (!) 125   Ht '5\' 11"'$  (1.803 m)   Wt 196 lb 12.8 oz (89.3 kg)   SpO2 97%   BMI 27.45 kg/m  , BMI Body mass index is 27.45 kg/m. GEN: Well nourished, well developed, in no acute distress  HEENT: normal  Neck: no JVD, carotid bruits, or masses Cardiac: irregular, tachycardic; no murmurs, rubs, or gallops,no edema  Respiratory:  clear to auscultation bilaterally, normal work of  breathing GI: soft, nontender, nondistended, + BS MS: no deformity or atrophy  Skin: warm and dry Neuro:  Strength and sensation are intact Psych: euthymic mood, full affect  EKG:  EKG is ordered today. Personal review of the ekg ordered shows real flutter, rate 106  Recent Labs: 02/06/2019: ALT 9; TSH 1.33 02/23/2019: BUN 45; Creatinine, Ser 2.00; Hemoglobin 13.2; Platelets 189; Potassium 4.8; Sodium 140    Lipid Panel     Component Value Date/Time   CHOL 116 02/06/2019 1217   TRIG 102.0 02/06/2019 1217   HDL 32.70 (L) 02/06/2019 1217   CHOLHDL 4 02/06/2019 1217   VLDL 20.4 02/06/2019 1217   LDLCALC 63 02/06/2019 1217   LDLDIRECT 84.0 05/02/2018 1523     Wt Readings from Last 3 Encounters:  04/03/19 196 lb 12.8 oz (89.3 kg)  03/30/19 197 lb 9.6 oz (89.6 kg)  02/27/19 189 lb 3.2 oz (85.8 kg)      Other studies Reviewed: Additional studies/ records that were reviewed today include: TTE 06/16/17  Review of the above records today demonstrates:  - Left ventricle: The cavity size was normal. There was moderate   concentric hypertrophy. Systolic function was moderately to   severely reduced. The estimated ejection fraction was in the   range of 30% to 35%. Severe diffuse hypokinesis with distinct   regional wall motion abnormalities. There is akinesis of the   apical myocardium. The study is not technically sufficient to   allow evaluation of LV diastolic function. - Aortic valve: There was mild to moderate regurgitation directed   eccentrically in the LVOT and towards the mitral anterior   leaflet. - Aorta: Aortic root dimension: 40 mm (ED). Ascending aortic   diameter: 39 mm (S). - Aortic root: The aortic root was mildly dilated. - Ascending aorta: The ascending aorta was mildly dilated. - Mitral valve: Calcified annulus. Mildly thickened leaflets .   There was moderate regurgitation directed eccentrically and   posteriorly. - Left atrium: The atrium was mildly  dilated. - Right ventricle: The cavity size was mildly dilated. Wall   thickness was normal. - Right atrium: The atrium was mildly dilated. - Pulmonary arteries: PA peak pressure: 43 mm Hg (S).  TTE 09/14/17 - Limited study for LVEF. Compared to a prior study in 06/2017, the   LVEF has improved to  42%.  ASSESSMENT AND PLAN:  1.  Paroxysmal atrial fibrillation/flutter: Currently on dofetilide and Eliquis.  Unfortunately he has been very difficult to control with an atypical atrial flutter today.  I discussed with him options including ablation.  Risks and benefits include bleeding, tamponade, heart block, stroke, damage surrounding organs.  He understands these risks and has agreed to the procedure.  This patients CHA2DS2-VASc Score and unadjusted Ischemic Stroke Rate (% per year) is equal to 3.2 % stroke rate/year from a score of 3  Above score calculated as 1 point each if present [CHF, HTN, DM, Vascular=MI/PAD/Aortic Plaque, Age if 65-74, or Male] Above score calculated as 2 points each if present [Age > 75, or Stroke/TIA/TE]   2.  Chronic systolic and diastolic heart failure: Currently on optimal medical therapy with benazepril and metoprolol.  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  We Kodah Maret plan for ablation.  3.  Hypertension: Currently well controlled  Current medicines are reviewed at length with the patient today.   The patient does not have concerns regarding his medicines.  The following changes were made today: None  Labs/ tests ordered today include:  Orders Placed This Encounter  Procedures  . CT CARDIAC MORPH/PULM VEIN W/CM&W/O CA SCORE  . CT CORONARY FRACTIONAL FLOW RESERVE DATA PREP  . CT CORONARY FRACTIONAL FLOW RESERVE FLUID ANALYSIS  . Basic metabolic panel  . CBC  . EKG 12-Lead     Disposition:   FU with Shyrl Obi 3 months  Signed, Yong Grieser Meredith [name], MD  04/03/2019 9:38 AM      [facility] [address] [place] [address] [phone] (office) [phone] (fax)

## 2019-04-03 NOTE — Patient Instructions (Addendum)
Medication Instructions:  Your physician recommends that you continue on your current medications as directed. Please refer to the Current Medication list given to you today.  *If you need a refill on your cardiac medications before your next appointment, please call your pharmacy.  Labwork: You are scheduled for pre procedure blood work on 04/30/2019.  You do not have to be fasting for this blood work.  You can stop by the office anytime between 7:30 am - 4:00 pm If you have labs (blood work) drawn today and your tests are completely normal, you will receive your results only by:  [place] (if you have MyChart) OR  A paper copy in the mail If you have any lab test that is abnormal or we need to change your treatment, we will call you to review the results.  Testing/Procedures: Your physician has requested that you have cardiac CT within 7 days prior to your ablation. Cardiac computed tomography (CT) is a painless test that uses an x-ray machine to take clear, detailed pictures of your heart. For further information please visit [email or web address]. Please follow instruction below located under special instructions. You will get a call from our office to schedule the date for this test.  Your physician has recommended that you have an ablation. Catheter ablation is a medical procedure used to treat some cardiac arrhythmias (irregular heartbeats). During catheter ablation, a long, thin, flexible tube is put into a blood vessel in your groin (upper thigh), or neck. This tube is called an ablation catheter. It is then guided to your heart through the blood vessel. Radio frequency waves destroy small areas of heart tissue where abnormal heartbeats may cause an arrhythmia to start. Please see the instructions below located under special instructions  Follow-Up: You are scheduled for a "virtual visit" (telemedicine) on 05/01/2019 @ 4:15 for a telephone call with Dr. Curt Bears.  Your physician  recommends that you schedule a follow-up appointment in: 4 weeks, after your procedure on 05/16/19, with Roderic Palau NP in the AFib clinic.  Your physician recommends that you schedule a follow up appointment in: 3 months, after your procedure on 05/16/19, with Dr. Curt Bears.  Thank you for choosing [facility]!! Trinidad Curet, RN [phone]   Any Other Special Instructions Will Be Listed Below    CT INSTRUCTIONS Your cardiac CT will be scheduled at:   [facility] [address] [place], [place] [address] [phone]  Please arrive at the [facility] main entrance of [facility] at ___________*. Please arrive 30-45 minutes prior to test start time. Proceed to the [facility] Radiology Department (first floor) to check-in and test prep.  Please follow these instructions carefully (unless otherwise directed):  Hold all erectile dysfunction medications at least 48 hours prior to test.  On the Night Before the Test:  Be sure to Drink plenty of water.  Do not consume any caffeinated/decaffeinated beverages or chocolate 12 hours prior to your test.  Do not take any antihistamines 12 hours prior to your test.  If you take Metformin do not take 24 hours prior to test.  On the Day of the Test:  Drink plenty of water. Do not drink any water within one hour of the test.  Do not eat any food 4 hours prior to the test.  You may take your regular medications prior to the test.   Take your Toprol  two hours prior to test.  HOLD Furosemide/Hydrochlorothiazide morning of the test.  After the  Test:  Drink plenty of water.  After receiving IV contrast, you may experience a mild flushed feeling. This is normal.  On occasion, you may experience a mild rash up to 24 hours after the test. This is not dangerous. If this occurs, you can take Benadryl 25 mg and increase your fluid intake.  If you experience trouble breathing, this can be serious. If it is  severe call 911 IMMEDIATELY. If it is mild, please call our office.  If you take any of these medications: Glipizide/Metformin, Avandament, Glucavance, please do not take 48 hours after completing test.  Please contact the cardiac imaging nurse navigator should you have any questions/concerns Marchia Bond, RN Navigator Cardiac Imaging [facility] [phone] Office  [phone] Cell       Electrophysiology/Ablation Procedure Instructions   You are scheduled for a(n)  ablation on 05/16/19 with Dr. Allegra Lai.   1.   Pre procedure testing-             A.  LAB WORK --- On 04/30/19  for your pre procedure blood work. You may stop by the office anytime that day between 7:30 am - 4:00 pm. You do not have to be fasting for this blood work.               B. COVID TEST-- On 05/12/19 @ 10:00 am - You will go to [facility] ([place]) for your Covid testing.   This is a drive thru test site.  There will be multiple testing areas.  Be sure to share with the first checkpoint that you are there for pre-procedure/surgery testing. This will put you into the right (yellow) lane that leads to the PAT testing team. Stay in your car and the nurse team will come to your car to test you.  After you are tested please go home and self quarantine until the day of your procedure.     2. On the day of your procedure 05/16/19 you will go to [facility] [id number]) at 9:30 am.  Dennis Bast will go to the main entrance A [employer]) and enter where the [employer] are.  Your driver will drop you off and you will head down the hallway to ADMITTING.  You may have one support person come in to the hospital with you.  They will be asked to wait in the waiting room.   3.   Do not eat or drink after midnight prior to your procedure.   4.   Do NOT take any medications the morning of your procedure.   5.  Plan for an overnight stay.  If you use your  phone frequently bring your phone charger.   6. You will follow up with the AFIB clinic 4 weeks after your procedure.  You will follow up with Dr. Curt Bears  3 months after your procedure.  These appointments will be made for you.   * If you have ANY questions please call the office [phone] and ask for Wanna Gully RN or send me a MyChart message   * Occasionally, EP Studies and ablations can become lengthy.  Please make your family aware of this before your procedure starts.  Average time ranges from 2-8 hours for EP studies/ablations.  Your physician will call your family after the procedure with the results.  Cardiac Ablation Cardiac ablation is a procedure to disable (ablate) a small amount of heart tissue in very specific places. The heart has many electrical connections. Sometimes these connections are abnormal and can cause the heart to beat very fast or irregularly. Ablating some of the problem areas can improve the heart rhythm or return it to normal. Ablation may be done for people who:  Have Wolff-Parkinson-White syndrome.  Have fast heart rhythms (tachycardia).  Have taken medicines for an abnormal heart rhythm (arrhythmia) that were not effective or caused side effects.  Have a high-risk heartbeat that may be life-threatening. During the procedure, a small incision is made in the neck or the groin, and a long, thin, flexible tube (catheter) is inserted into the incision and moved to the heart. Small devices (electrodes) on the tip of the catheter will send out electrical currents. A type of X-ray (fluoroscopy) will be used to help guide the catheter and to provide images of the heart. Tell a health care provider about:  Any allergies you have.  All medicines you are taking, including vitamins, herbs, eye drops, creams, and over-the-counter medicines.  Any problems you or family members have had with anesthetic medicines.  Any blood disorders  you have.  Any surgeries you have had.  Any medical conditions you have, such as kidney failure.  Whether you are pregnant or may be pregnant. What are the risks? Generally, this is a safe procedure. However, problems may occur, including:  Infection.  Bruising and bleeding at the catheter insertion site.  Bleeding into the chest, especially into the sac that surrounds the heart. This is a serious complication.  Stroke or blood clots.  Damage to other structures or organs.  Allergic reaction to medicines or dyes.  Need for a permanent pacemaker if the normal electrical system is damaged. A pacemaker is a small computer that sends electrical signals to the heart and helps your heart beat normally.  The procedure not being fully effective. This may not be recognized until months later. Repeat ablation procedures are sometimes required. What happens before the procedure?  Follow instructions from your health care provider about eating or drinking restrictions.  Ask your health care provider about: ? Changing or stopping your regular medicines. This is especially important if you are taking diabetes medicines or blood thinners. ? Taking medicines such as aspirin and ibuprofen. These medicines can thin your blood. Do not take these medicines before your procedure if your health care provider instructs you not to.  Plan to have someone take you home from the hospital or clinic.  If you will be going home right after the procedure, plan to have someone with you for 24 hours. What happens during the procedure?  To lower your risk of infection: ? Your health care team will wash or sanitize their hands. ? Your skin will be washed with soap. ? Hair may be removed from the incision area.  An IV tube will be inserted into one of your veins.  You will be given a medicine to help you relax (sedative).  The skin on your neck or groin will be numbed.  An incision will be made in your  neck or your groin.  A needle will be inserted through the incision and into a large vein in your neck or groin.  A catheter will be inserted into the needle and moved to your heart.  Dye may be injected through the catheter to help your surgeon see the area of the  heart that needs treatment.  Electrical currents will be sent from the catheter to ablate heart tissue in desired areas. There are three types of energy that may be used to ablate heart tissue: ? Heat (radiofrequency energy). ? Laser energy. ? Extreme cold (cryoablation).  When the necessary tissue has been ablated, the catheter will be removed.  Pressure will be held on the catheter insertion area to prevent excessive bleeding.  A bandage (dressing) will be placed over the catheter insertion area. The procedure may vary among health care providers and hospitals. What happens after the procedure?  Your blood pressure, heart rate, breathing rate, and blood oxygen level will be monitored until the medicines you were given have worn off.  Your catheter insertion area will be monitored for bleeding. You will need to lie still for a few hours to ensure that you do not bleed from the catheter insertion area.  Do not drive for 24 hours or as long as directed by your health care provider. Summary  Cardiac ablation is a procedure to disable (ablate) a small amount of heart tissue in very specific places. Ablating some of the problem areas can improve the heart rhythm or return it to normal.  During the procedure, electrical currents will be sent from the catheter to ablate heart tissue in desired areas. This information is not intended to replace advice given to you by your health care provider. Make sure you discuss any questions you have with your health care provider. Document Released: 10/10/2008 Document Revised: 11/14/2017 Document Reviewed: 04/12/2016 Elsevier Patient Education  2020 [employer].

## 2019-04-03 NOTE — Progress Notes (Signed)
Great! Thanks, Will

## 2019-04-05 ENCOUNTER — Ambulatory Visit: Payer: Medicare Other

## 2019-04-05 MED FILL — FLUAD QUADRIVALENT 0.5 ML P: 0.5 | 1 days supply | Qty: 1 | Fill #0

## 2019-04-06 ENCOUNTER — Telehealth: Payer: Self-pay

## 2019-04-06 NOTE — Telephone Encounter (Signed)
Copied from [place] [phone]. Topic: General - Inquiry >> Apr 06, 2019 11:45 AM Virl Axe D wrote: Reason for CRM: Pt stated he was scheduled for a flu shot on 04/05/19. However he did not go to the actual office to check in. He stated when he walked in on the first floor there were signs that read "flu shot here" at the pharmacy. Pt stated he had to pay $47.21 co-pay for the flu shot. He would like to know if anyone can tell him why he was charged or explain to him what happened. Please advise.    Spoke with patient he stated it was misleading when he entered the building and there where signs saying FLU shots here, he said he and the nurse who gave him the shot was confused as to why he had to pay for the flu shot. I have sent the information over to [facility] in the pharmacy for help

## 2019-04-08 NOTE — Telephone Encounter (Signed)
I am not sure what happened. Is there any recourse?

## 2019-04-09 ENCOUNTER — Telehealth: Payer: Self-pay | Admitting: Cardiovascular Disease

## 2019-04-09 NOTE — Telephone Encounter (Signed)
   Primary Cardiologist: Sanda Klein, MD  Chart reviewed as part of pre-operative protocol coverage. Simple dental extractions are considered low risk procedures per guidelines and generally do not require any specific cardiac clearance. It is also generally accepted that for simple extractions and dental cleanings, there is no need to interrupt blood thinner therapy.   SBE prophylaxis is not required for the patient.  I will route this recommendation to the requesting party via Epic fax function and remove from pre-op pool.  Please call with questions.  Dr. Curt Bears, patient recently saw you for atrial fibrillation ablation, are you ok with the patient proceeding with single teeth extraction or do you recommend delay dental extraction until after afib ablation?  [place], [place] 04/09/2019, 4:17 PM

## 2019-04-09 NOTE — Telephone Encounter (Signed)
I spoke to [name] in the pharmacy and this was his response:   [1:35 PM] [name] (HS)     Looks like on the 29th, we ran a claim through his insurance who rejected it at the pharmacy as "Not Covered." Lauralyn Primes told him it would be $47.21 at the pharmacy and he elected to get it at that time. ?[1:36 PM] [name] (HS)     We have had 2 injections this year with that same rejection; in the case the insurance rejects the claim, we offer to provide the injection without billing the insurance. ?[1:37 PM] [name] (HS)     I hate that he could have gotten it free if it was administered upstairs. I'll educate my staff to check EPIC to see if that patient is one of yours and direct them upstairs.

## 2019-04-09 NOTE — Telephone Encounter (Signed)
If anticoagulation can continue without interruption, ok for tooth extraction.

## 2019-04-09 NOTE — Telephone Encounter (Signed)
   [facility] Pre-operative Risk Assessment    Request for surgical clearance:  1. What type of surgery is being performed? Extraction #30   2. When is this surgery scheduled? TBD   3. What type of clearance is required (medical clearance vs. Pharmacy clearance to hold med vs. Both)? Both   4. Are there any medications that need to be held prior to surgery and how long? Eliquis   5. Practice name and name of physician performing surgery? Tammy Artis DDS   6. What is your office phone number [phone]    7.   What is your office fax number [phone]  8.   Anesthesia type (None, local, MAC, general) ? Not specified  Please address if patient needs prophylactic antibiotics (premedication), any adjustment to medication regiment, if you would not advise treatment at this time or if treatment can be completed without any contraindications    Fidel Levy 04/09/2019, 3:05 PM  _________________________________________________________________   (provider comments below)

## 2019-04-11 ENCOUNTER — Telehealth: Payer: Self-pay | Admitting: *Deleted

## 2019-04-11 NOTE — Telephone Encounter (Signed)
Patient notified to call insurance to see if they will reimburse him.  He will call and see.  I advised him to keep receipt of the flu shot as he may have to send that in.

## 2019-04-11 NOTE — Telephone Encounter (Signed)
Sent staff message to manager to see if we can do anything for patient.

## 2019-04-11 NOTE — Telephone Encounter (Signed)
Copied from [place] [phone]. Topic: General - Inquiry >> Apr 09, 2019  2:34 PM Mathis Bud wrote: Reason for CRM: Patient states he was charged with a now show fee for flu shot 10/29. Patient states he did show up for flu shot. Call back [phone]

## 2019-04-11 NOTE — Telephone Encounter (Signed)
Double checked an dpt was not charged a NS fee. Also - please ask him to contact his insurance company to see if he can be reimbursed as they should have covered his flu shot even if he did receive it at the pharmacy. We cannot assist him with the billing for that because they are separate from [place].

## 2019-04-30 ENCOUNTER — Other Ambulatory Visit: Payer: Self-pay

## 2019-04-30 ENCOUNTER — Other Ambulatory Visit: Payer: Medicare Other | Admitting: *Deleted

## 2019-04-30 DIAGNOSIS — Z01812 Encounter for preprocedural laboratory examination: Secondary | ICD-10-CM | POA: Diagnosis not present

## 2019-04-30 DIAGNOSIS — I480 Paroxysmal atrial fibrillation: Secondary | ICD-10-CM | POA: Diagnosis not present

## 2019-04-30 LAB — CBC
Hematocrit: 39.2 % (ref 37.5–51.0)
Hemoglobin: 12.9 g/dL — ABNORMAL LOW (ref 13.0–17.7)
MCH: 26.5 pg — ABNORMAL LOW (ref 26.6–33.0)
MCHC: 32.9 g/dL (ref 31.5–35.7)
MCV: 81 fL (ref 79–97)
Platelets: 240 10*3/uL (ref 150–450)
RBC: 4.86 x10E6/uL (ref 4.14–5.80)
RDW: 16.4 % — ABNORMAL HIGH (ref 11.6–15.4)
WBC: 4.2 10*3/uL (ref 3.4–10.8)

## 2019-04-30 LAB — BASIC METABOLIC PANEL
BUN/Creatinine Ratio: 21 (ref 10–24)
BUN: 42 mg/dL — ABNORMAL HIGH (ref 8–27)
CO2: 27 mmol/L (ref 20–29)
Calcium: 10.1 mg/dL (ref 8.6–10.2)
Chloride: 103 mmol/L (ref 96–106)
Creatinine, Ser: 1.97 mg/dL — ABNORMAL HIGH (ref 0.76–1.27)
GFR calc Af Amer: 40 mL/min/{1.73_m2} — ABNORMAL LOW (ref >59)
GFR calc non Af Amer: 34 mL/min/{1.73_m2} — ABNORMAL LOW (ref >59)
Glucose: 113 mg/dL — ABNORMAL HIGH (ref 65–99)
Potassium: 4.7 mmol/L (ref 3.5–5.2)
Sodium: 140 mmol/L (ref 134–144)

## 2019-05-01 ENCOUNTER — Other Ambulatory Visit: Payer: Self-pay | Admitting: Cardiovascular Disease

## 2019-05-01 ENCOUNTER — Telehealth (INDEPENDENT_AMBULATORY_CARE_PROVIDER_SITE_OTHER): Payer: Medicare Other | Admitting: Cardiology

## 2019-05-01 DIAGNOSIS — I4891 Unspecified atrial fibrillation: Secondary | ICD-10-CM

## 2019-05-01 NOTE — Progress Notes (Signed)
Electrophysiology TeleHealth Note   Due to national recommendations of social distancing due to COVID 19, an audio/video telehealth visit is felt to be most appropriate for this patient at this time.  See Epic message for the patient's consent to telehealth for [facility].   Date:  05/01/2019   ID:  [name], DOB Aug 30, 1950, MRN [id number]  Location: patient's home  Provider location: [address], [place]  Evaluation Performed: Follow-up visit  PCP:  [name], MD  Cardiologist:  Sanda Klein, MD  Electrophysiologist:  Dr Curt Bears  Chief Complaint:  AF  History of Present Illness:    [name] is a 68 y.o. male who presents via audio/video conferencing for a telehealth visit today.  Since last being seen in our clinic, the patient reports doing very well.  Today, he denies symptoms of palpitations, chest pain, shortness of breath,  lower extremity edema, dizziness, presyncope, or syncope.  The patient is otherwise without complaint today.  The patient denies symptoms of fevers, chills, cough, or new SOB worrisome for COVID 19.  He has a history of atrial fibrillation, atrial flutter, aortic insufficiency, hypertension, OSA, hyperlipidemia, type 2 diabetes, and diastolic heart failure.  He has an AF ablation scheduled for 05/16/2019.  Today, denies symptoms of palpitations, chest pain, shortness of breath, orthopnea, PND, lower extremity edema, claudication, dizziness, presyncope, syncope, bleeding, or neurologic sequela. The patient is tolerating medications without difficulties.    Past Medical History:  Diagnosis Date   AORTIC STENOSIS, MODERATE 12/05/2009   Atrial fibrillation (HCC)    Atrial fibrillation with RVR ([place]) 07/05/2017   Atrial flutter ([place]) 12/05/2009   Atypical chest pain 11/08/2011   BENIGN PROSTATIC HYPERTROPHY, HX OF 12/05/2009   CHEST PAIN-UNSPECIFIED 11/11/2009   CHF 12/05/2009   Chronic renal insufficiency 12/16/2016    Debility 04/18/2012   Diarrhea 12/16/2016   DYSPNEA 12/19/2009   Edema 04/18/2012   ERECTILE DYSFUNCTION 01/25/2007   FATIGUE, CHRONIC 11/11/2009   Gout 07/10/2012   Heart murmur    History of kidney stones    "they passed" (07/05/2017)   Hyperkalemia 04/18/2012   Hyperlipidemia    Hypertension    HYPERTENSION 01/25/2007   Hypoxia 05/05/2011   Insomnia 05/13/2016   INSOMNIA, HX OF 01/25/2007   Knee pain, bilateral 12/28/2010   Knee pain, right 12/28/2010   NEUROMA 01/05/2010   OBESITY NOS 01/25/2007   OSA on CPAP 04/06/2010   PERIPHERAL NEUROPATHY 01/05/2010   PULMONARY FUNCTION TESTS, ABNORMAL 02/02/2010   Superficial thrombophlebitis of left leg 09/07/2011   TESTICULAR HYPOFUNCTION 01/05/2010   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   Toe pain, right 07/10/2012   Type II diabetes mellitus ([place]) 12/05/2009   UNSPECIFIED ANEMIA 01/05/2010    Past Surgical History:  Procedure Laterality Date   CARDIAC CATHETERIZATION  10/16/2009   nonischemic cardiomyopathy   CARDIOVERSION N/A 03/25/2015   Procedure: CARDIOVERSION;  Surgeon: Pixie Casino, MD;  Location: [facility] ENDOSCOPY;  Service: Cardiovascular;  Laterality: N/A;   CARDIOVERSION N/A 06/17/2017   Procedure: CARDIOVERSION;  Surgeon: Sanda Klein, MD;  Location: [facility] ENDOSCOPY;  Service: Cardiovascular;  Laterality: N/A;   CARDIOVERSION N/A 07/08/2017   Procedure: CARDIOVERSION;  Surgeon: Fay Records, MD;  Location: [facility] ENDOSCOPY;  Service: Cardiovascular;  Laterality: N/A;   CARDIOVERSION N/A 02/27/2019   Procedure: CARDIOVERSION;  Surgeon: Sanda Klein, MD;  Location: [facility] ENDOSCOPY;  Service: Cardiovascular;  Laterality: N/A;   METATARSAL OSTEOTOMY Bilateral ~ 1980   removed part of  5th metatarsal to corect curvature of toes     Current Outpatient Medications  Medication Sig Dispense Refill   albuterol (VENTOLIN HFA) 108 (90 Base) MCG/ACT inhaler Inhale 2 puffs into the lungs every 6 (six) hours as needed for wheezing or  shortness of breath. 1 Inhaler 6   allopurinol (ZYLOPRIM) 300 MG tablet Take 1 tablet by mouth once daily 90 tablet 0   benazepril (LOTENSIN) 5 MG tablet Take 1/2 (one-half) tablet by mouth once daily 45 tablet 3   Blood Glucose Monitoring Suppl (ONETOUCH VERIO) w/Device KIT Use to check blood sugar TID as directed.  Dx Code: E11.9 1 kit 0   calcium carbonate (TUMS - DOSED IN MG ELEMENTAL CALCIUM) 500 MG chewable tablet Chew 1 tablet by mouth 3 (three) times daily as needed (heartburn/indigestion.).      cetirizine (ZYRTEC) 10 MG tablet Take 1 tablet (10 mg total) by mouth daily. 30 tablet 11   Clobetasol Prop Emollient Base (CLOBETASOL PROPIONATE E) 0.05 % emollient cream Apply 1 application topically 2 (two) times daily as needed (insect bite). 60 g 1   clotrimazole-betamethasone (LOTRISONE) cream Apply 1 application topically 2 (two) times daily. 30 g 1   colchicine (COLCRYS) 0.6 MG tablet TAKE ONE TABLET BY MOUTH DAILY AS NEEDED FOR GOUT FLARE UPS. 30 tablet 0   dofetilide (TIKOSYN) 500 MCG capsule Take 1 capsule (500 mcg total) by mouth every 12 (twelve) hours. 60 capsule    ELIQUIS 5 MG TABS tablet Take 1 tablet by mouth twice daily 60 tablet 10   fish oil-omega-3 fatty acids 1000 MG capsule Take 1 g by mouth daily at 12 noon.      furosemide (LASIX) 40 MG tablet TAKE TWO TABLETS BY MOUTH EVERY MORNING AND TAKE ONE TABLET BY MOUTH EVERY AFTERNOON 270 tablet 0   glucose blood test strip (Verio) Use as instructed Check blood sugars TID DxE11.9 100 each 12   glyBURIDE (DIABETA) 5 MG tablet Take 1 tablet by mouth once daily with breakfast 90 tablet 0   HYDROcodone-acetaminophen (NORCO) 10-325 MG tablet Take 1 tablet by mouth every 8 (eight) hours as needed for moderate pain or severe pain. 90 tablet 0   LORazepam (ATIVAN) 1 MG tablet Take 1 tablet (1 mg total) by mouth every 8 (eight) hours as needed for anxiety. 30 tablet 5   magnesium oxide (MAG-OX) 400 MG tablet Take 1 tablet  (400 mg total) by mouth daily. 30 tablet 11   metFORMIN (GLUCOPHAGE) 500 MG tablet TAKE 1 TABLET BY MOUTH THREE TIMES DAILY 270 tablet 0   metoprolol succinate (TOPROL-XL) 100 MG 24 hr tablet Take 1 tablet (100 mg total) by mouth 2 (two) times daily. 180 tablet 0   metoprolol succinate (TOPROL-XL) 50 MG 24 hr tablet Take 3 tablets (150 mg total) by mouth 2 (two) times daily. Take with or immediately following a meal. 90 tablet 11   Multiple Vitamin (MULTIVITAMIN WITH MINERALS) TABS tablet Take 1 tablet by mouth daily at 12 noon.     naproxen (NAPROSYN) 375 MG tablet Take 375 mg by mouth as needed.     potassium chloride SA (K-DUR,KLOR-CON) 20 MEQ tablet TAKE 1 TABLET BY MOUTH ONCE DAILY AND  EXTRA  TABLET  IF  WEIGHT  IS  ABOVE  210  LBS 180 tablet 2   pravastatin (PRAVACHOL) 40 MG tablet Take 1 tablet by mouth once daily 90 tablet 3   temazepam (RESTORIL) 30 MG capsule Take 1 capsule (30 mg total) by  mouth at bedtime. 30 capsule 5   No current facility-administered medications for this visit.     Allergies:   Sulfonamide derivatives and Zolpidem   Social History:  The patient  reports that he quit smoking about 38 years ago. He has a 10.00 pack-year smoking history. He has never used smokeless tobacco. He reports that he does not drink alcohol or use drugs.   Family History:  The patient's  family history includes Aneurysm in his sister; Cancer in his father; Clotting disorder in his mother; Diabetes in his mother and sister; Heart attack in his mother; Heart disease in his mother; Hyperlipidemia in his mother; Hypertension in his mother and sister; Leukemia in his maternal grandmother; Lung disease in his father; Other in his sister; Seizures in his sister; Stroke in his mother.   ROS:  Please see the history of present illness.   All other systems are personally reviewed and negative.    Exam:    Vital Signs:  BP 100/79    Pulse (!) 118    Temp (!) 95.9 F (35.5 C)    Wt 190 lb  (86.2 kg)    SpO2 98%    BMI 26.50 kg/m   no acute distress, no shortness of breath.  Labs/Other Tests and Data Reviewed:    Recent Labs: 02/06/2019: ALT 9; TSH 1.33 04/30/2019: BUN 42; Creatinine, Ser 1.97; Hemoglobin 12.9; Platelets 240; Potassium 4.7; Sodium 140   Wt Readings from Last 3 Encounters:  05/01/19 190 lb (86.2 kg)  04/03/19 196 lb 12.8 oz (89.3 kg)  03/30/19 197 lb 9.6 oz (89.6 kg)     Other studies personally reviewed: Additional studies/ records that were reviewed today include: ECG 04/03/2019 personally reviewed Review of the above records today demonstrates: Atrial flutter, rate 106   ASSESSMENT & PLAN:    1.  Atrial fibrillation/atrial flutter: Currently on Eliquis and dofetilide..  We Tattianna Schnarr plan for ablation 05/16/2019.  Risks and benefits were discussed.  Risk include bleeding, tamponade, heart block, stroke, damage surrounding organs.  He understand these risks and is agreed to the procedure.  All procedure related questions were answered.   COVID 19 screen The patient denies symptoms of COVID 19 at this time.  The importance of social distancing was discussed today.  Follow-up: 3 months  Current medicines are reviewed at length with the patient today.   The patient does not have concerns regarding his medicines.  The following changes were made today:  none  Labs/ tests ordered today include:  No orders of the defined types were placed in this encounter.    Patient Risk:  after full review of this patients clinical status, I feel that they are at moderate risk at this time.  Today, I have spent 10 minutes with the patient with telehealth technology discussing AF .    Signed, Ahna Konkle Meredith [name], MD  05/01/2019 4:21 PM     [address] [place] [address] [phone] (office) [phone] (fax)

## 2019-05-01 NOTE — Telephone Encounter (Signed)
Rx request sent to pharmacy.  

## 2019-05-07 DIAGNOSIS — E291 Testicular hypofunction: Secondary | ICD-10-CM | POA: Diagnosis not present

## 2019-05-09 ENCOUNTER — Telehealth (HOSPITAL_COMMUNITY): Payer: Self-pay | Admitting: Emergency Medicine

## 2019-05-09 NOTE — Telephone Encounter (Signed)
Left message on voicemail with name and callback number Bonnell Placzek RN Navigator Cardiac Imaging [facility] [phone] Office [phone] Cell  

## 2019-05-10 ENCOUNTER — Ambulatory Visit (HOSPITAL_COMMUNITY)
Admission: RE | Admit: 2019-05-10 | Discharge: 2019-05-10 | Disposition: A | Discharge status: Home / Self Care | Payer: Medicare Other | Source: Ambulatory Visit | Attending: Cardiology | Admitting: Cardiology

## 2019-05-10 ENCOUNTER — Other Ambulatory Visit: Payer: Self-pay

## 2019-05-10 DIAGNOSIS — I480 Paroxysmal atrial fibrillation: Secondary | ICD-10-CM | POA: Diagnosis not present

## 2019-05-10 MED ORDER — DILTIAZEM HCL 25 MG/5ML IV SOLN
5.0000 mg | INTRAVENOUS | Status: DC | PRN
  Administered 2019-05-10 (×2): 5 mg via INTRAVENOUS
  Filled 2019-05-10: qty 5

## 2019-05-10 MED ORDER — METOPROLOL TARTRATE 5 MG/5ML IV SOLN
5.0000 mg | INTRAVENOUS | Status: DC | PRN
  Administered 2019-05-10 (×2): 5 mg via INTRAVENOUS

## 2019-05-10 MED ORDER — METOPROLOL TARTRATE 5 MG/5ML IV SOLN
INTRAVENOUS | Status: AC
  Administered 2019-05-10: 11:00:00 5 mg via INTRAVENOUS
  Filled 2019-05-10: qty 20

## 2019-05-10 MED ORDER — IOHEXOL 350 MG/ML SOLN
100.0000 mL | Freq: Once | INTRAVENOUS | Status: AC | PRN
  Administered 2019-05-10: 12:00:00 100 mL via INTRAVENOUS

## 2019-05-10 MED ORDER — DILTIAZEM HCL 25 MG/5ML IV SOLN
INTRAVENOUS | Status: AC
  Administered 2019-05-10: 12:00:00 5 mg via INTRAVENOUS
  Filled 2019-05-10: qty 5

## 2019-05-12 ENCOUNTER — Other Ambulatory Visit (HOSPITAL_COMMUNITY)
Admission: RE | Admit: 2019-05-12 | Discharge: 2019-05-12 | Disposition: A | Discharge status: Home / Self Care | Payer: Medicare Other | Source: Ambulatory Visit | Attending: Cardiology | Admitting: Cardiology

## 2019-05-12 DIAGNOSIS — Z20828 Contact with and (suspected) exposure to other viral communicable diseases: Secondary | ICD-10-CM | POA: Diagnosis not present

## 2019-05-12 DIAGNOSIS — Z01812 Encounter for preprocedural laboratory examination: Secondary | ICD-10-CM | POA: Insufficient documentation

## 2019-05-14 LAB — NOVEL CORONAVIRUS, NAA (HOSP ORDER, SEND-OUT TO REF LAB; TAT 18-24 HRS): SARS-CoV-2, NAA: NOT DETECTED

## 2019-05-15 ENCOUNTER — Encounter (HOSPITAL_COMMUNITY): Payer: Self-pay

## 2019-05-15 ENCOUNTER — Ambulatory Visit (HOSPITAL_COMMUNITY): Admit: 2019-05-15 | Payer: Medicare Other | Admitting: Internal Medicine

## 2019-05-15 SURGERY — ECHOCARDIOGRAM, TRANSESOPHAGEAL
Anesthesia: Moderate Sedation

## 2019-05-16 ENCOUNTER — Inpatient Hospital Stay (HOSPITAL_COMMUNITY)
Admission: RE | Admit: 2019-05-16 | Discharge: 2019-05-29 | DRG: 273 | Disposition: A | Discharge status: Home / Self Care | Payer: Medicare Other | Source: Ambulatory Visit | Attending: Internal Medicine | Admitting: Internal Medicine

## 2019-05-16 ENCOUNTER — Ambulatory Visit (HOSPITAL_COMMUNITY): Payer: Medicare Other

## 2019-05-16 ENCOUNTER — Inpatient Hospital Stay (HOSPITAL_COMMUNITY)
Admission: RE | Disposition: A | Discharge status: Home / Self Care | Payer: Self-pay | Source: Ambulatory Visit | Attending: Cardiology

## 2019-05-16 ENCOUNTER — Other Ambulatory Visit: Payer: Self-pay

## 2019-05-16 ENCOUNTER — Ambulatory Visit (HOSPITAL_COMMUNITY): Payer: Medicare Other | Admitting: Certified Registered Nurse Anesthetist

## 2019-05-16 DIAGNOSIS — J9611 Chronic respiratory failure with hypoxia: Secondary | ICD-10-CM

## 2019-05-16 DIAGNOSIS — Z20828 Contact with and (suspected) exposure to other viral communicable diseases: Secondary | ICD-10-CM | POA: Diagnosis present

## 2019-05-16 DIAGNOSIS — J9690 Respiratory failure, unspecified, unspecified whether with hypoxia or hypercapnia: Secondary | ICD-10-CM

## 2019-05-16 DIAGNOSIS — R338 Other retention of urine: Secondary | ICD-10-CM | POA: Diagnosis present

## 2019-05-16 DIAGNOSIS — I4819 Other persistent atrial fibrillation: Principal | ICD-10-CM | POA: Diagnosis present

## 2019-05-16 DIAGNOSIS — I5021 Acute systolic (congestive) heart failure: Secondary | ICD-10-CM

## 2019-05-16 DIAGNOSIS — I483 Typical atrial flutter: Secondary | ICD-10-CM | POA: Diagnosis not present

## 2019-05-16 DIAGNOSIS — Z8249 Family history of ischemic heart disease and other diseases of the circulatory system: Secondary | ICD-10-CM

## 2019-05-16 DIAGNOSIS — I5082 Biventricular heart failure: Secondary | ICD-10-CM | POA: Diagnosis not present

## 2019-05-16 DIAGNOSIS — N3289 Other specified disorders of bladder: Secondary | ICD-10-CM | POA: Diagnosis not present

## 2019-05-16 DIAGNOSIS — Z7984 Long term (current) use of oral hypoglycemic drugs: Secondary | ICD-10-CM

## 2019-05-16 DIAGNOSIS — Z006 Encounter for examination for normal comparison and control in clinical research program: Secondary | ICD-10-CM

## 2019-05-16 DIAGNOSIS — I429 Cardiomyopathy, unspecified: Secondary | ICD-10-CM | POA: Diagnosis present

## 2019-05-16 DIAGNOSIS — E1122 Type 2 diabetes mellitus with diabetic chronic kidney disease: Secondary | ICD-10-CM | POA: Diagnosis present

## 2019-05-16 DIAGNOSIS — Z833 Family history of diabetes mellitus: Secondary | ICD-10-CM

## 2019-05-16 DIAGNOSIS — J95821 Acute postprocedural respiratory failure: Secondary | ICD-10-CM | POA: Diagnosis not present

## 2019-05-16 DIAGNOSIS — Z823 Family history of stroke: Secondary | ICD-10-CM

## 2019-05-16 DIAGNOSIS — R570 Cardiogenic shock: Secondary | ICD-10-CM | POA: Diagnosis not present

## 2019-05-16 DIAGNOSIS — N179 Acute kidney failure, unspecified: Secondary | ICD-10-CM | POA: Diagnosis not present

## 2019-05-16 DIAGNOSIS — I130 Hypertensive heart and chronic kidney disease with heart failure and stage 1 through stage 4 chronic kidney disease, or unspecified chronic kidney disease: Secondary | ICD-10-CM | POA: Diagnosis present

## 2019-05-16 DIAGNOSIS — I340 Nonrheumatic mitral (valve) insufficiency: Secondary | ICD-10-CM

## 2019-05-16 DIAGNOSIS — Z806 Family history of leukemia: Secondary | ICD-10-CM

## 2019-05-16 DIAGNOSIS — G4733 Obstructive sleep apnea (adult) (pediatric): Secondary | ICD-10-CM | POA: Diagnosis present

## 2019-05-16 DIAGNOSIS — Z79899 Other long term (current) drug therapy: Secondary | ICD-10-CM

## 2019-05-16 DIAGNOSIS — Z87891 Personal history of nicotine dependence: Secondary | ICD-10-CM

## 2019-05-16 DIAGNOSIS — N1832 Chronic kidney disease, stage 3b: Secondary | ICD-10-CM | POA: Diagnosis present

## 2019-05-16 DIAGNOSIS — I5043 Acute on chronic combined systolic (congestive) and diastolic (congestive) heart failure: Secondary | ICD-10-CM | POA: Diagnosis present

## 2019-05-16 DIAGNOSIS — E785 Hyperlipidemia, unspecified: Secondary | ICD-10-CM | POA: Diagnosis present

## 2019-05-16 DIAGNOSIS — Z79890 Hormone replacement therapy (postmenopausal): Secondary | ICD-10-CM

## 2019-05-16 DIAGNOSIS — R339 Retention of urine, unspecified: Secondary | ICD-10-CM

## 2019-05-16 DIAGNOSIS — R918 Other nonspecific abnormal finding of lung field: Secondary | ICD-10-CM | POA: Diagnosis not present

## 2019-05-16 DIAGNOSIS — I471 Supraventricular tachycardia: Secondary | ICD-10-CM | POA: Diagnosis not present

## 2019-05-16 DIAGNOSIS — I351 Nonrheumatic aortic (valve) insufficiency: Secondary | ICD-10-CM | POA: Diagnosis present

## 2019-05-16 DIAGNOSIS — D649 Anemia, unspecified: Secondary | ICD-10-CM | POA: Diagnosis present

## 2019-05-16 HISTORY — PX: A-FLUTTER ABLATION: EP1230

## 2019-05-16 HISTORY — PX: ATRIAL FIBRILLATION ABLATION: EP1191

## 2019-05-16 HISTORY — PX: RIGHT HEART CATH: CATH118263

## 2019-05-16 HISTORY — PX: TEE WITHOUT CARDIOVERSION: SHX5443

## 2019-05-16 LAB — POCT I-STAT 7, (LYTES, BLD GAS, ICA,H+H)
Acid-base deficit: 8 mmol/L — ABNORMAL HIGH (ref 0.0–2.0)
Bicarbonate: 17.8 mmol/L — ABNORMAL LOW (ref 20.0–28.0)
Calcium, Ion: 1.18 mmol/L (ref 1.15–1.40)
HCT: 35 % — ABNORMAL LOW (ref 39.0–52.0)
Hemoglobin: 11.9 g/dL — ABNORMAL LOW (ref 13.0–17.0)
O2 Saturation: 96 %
Potassium: 4.4 mmol/L (ref 3.5–5.1)
Sodium: 145 mmol/L (ref 135–145)
TCO2: 19 mmol/L — ABNORMAL LOW (ref 22–32)
pCO2 arterial: 37.1 mmHg (ref 32.0–48.0)
pH, Arterial: 7.29 — ABNORMAL LOW (ref 7.350–7.450)
pO2, Arterial: 90 mmHg (ref 83.0–108.0)

## 2019-05-16 LAB — POCT ACTIVATED CLOTTING TIME
Activated Clotting Time: 285 seconds
Activated Clotting Time: 312 seconds
Activated Clotting Time: 351 seconds
Activated Clotting Time: 329 seconds
Activated Clotting Time: 257 seconds

## 2019-05-16 LAB — POCT I-STAT EG7
Acid-base deficit: 3 mmol/L — ABNORMAL HIGH (ref 0.0–2.0)
Acid-base deficit: 7 mmol/L — ABNORMAL HIGH (ref 0.0–2.0)
Acid-base deficit: 1 mmol/L (ref 0.0–2.0)
Bicarbonate: 24 mmol/L (ref 20.0–28.0)
Bicarbonate: 19.7 mmol/L — ABNORMAL LOW (ref 20.0–28.0)
Bicarbonate: 24.8 mmol/L (ref 20.0–28.0)
Bicarbonate: 25.3 mmol/L (ref 20.0–28.0)
Calcium, Ion: 1.27 mmol/L (ref 1.15–1.40)
Calcium, Ion: 1.14 mmol/L — ABNORMAL LOW (ref 1.15–1.40)
Calcium, Ion: 1.11 mmol/L — ABNORMAL LOW (ref 1.15–1.40)
Calcium, Ion: 1.12 mmol/L — ABNORMAL LOW (ref 1.15–1.40)
HCT: 37 % — ABNORMAL LOW (ref 39.0–52.0)
HCT: 35 % — ABNORMAL LOW (ref 39.0–52.0)
HCT: 33 % — ABNORMAL LOW (ref 39.0–52.0)
HCT: 34 % — ABNORMAL LOW (ref 39.0–52.0)
Hemoglobin: 12.6 g/dL — ABNORMAL LOW (ref 13.0–17.0)
Hemoglobin: 11.9 g/dL — ABNORMAL LOW (ref 13.0–17.0)
Hemoglobin: 11.2 g/dL — ABNORMAL LOW (ref 13.0–17.0)
Hemoglobin: 11.6 g/dL — ABNORMAL LOW (ref 13.0–17.0)
O2 Saturation: 50 %
O2 Saturation: 55 %
O2 Saturation: 66 %
O2 Saturation: 67 %
Potassium: 4.5 mmol/L (ref 3.5–5.1)
Potassium: 4 mmol/L (ref 3.5–5.1)
Potassium: 4.1 mmol/L (ref 3.5–5.1)
Potassium: 4.1 mmol/L (ref 3.5–5.1)
Sodium: 145 mmol/L (ref 135–145)
Sodium: 146 mmol/L — ABNORMAL HIGH (ref 135–145)
Sodium: 148 mmol/L — ABNORMAL HIGH (ref 135–145)
Sodium: 150 mmol/L — ABNORMAL HIGH (ref 135–145)
TCO2: 25 mmol/L (ref 22–32)
TCO2: 21 mmol/L — ABNORMAL LOW (ref 22–32)
TCO2: 26 mmol/L (ref 22–32)
TCO2: 27 mmol/L (ref 22–32)
pCO2, Ven: 48.8 mmHg (ref 44.0–60.0)
pCO2, Ven: 42 mmHg — ABNORMAL LOW (ref 44.0–60.0)
pCO2, Ven: 43.5 mmHg — ABNORMAL LOW (ref 44.0–60.0)
pCO2, Ven: 44.3 mmHg (ref 44.0–60.0)
pH, Ven: 7.3 (ref 7.250–7.430)
pH, Ven: 7.28 (ref 7.250–7.430)
pH, Ven: 7.364 (ref 7.250–7.430)
pH, Ven: 7.365 (ref 7.250–7.430)
pO2, Ven: 30 mmHg — CL (ref 32.0–45.0)
pO2, Ven: 32 mmHg (ref 32.0–45.0)
pO2, Ven: 36 mmHg (ref 32.0–45.0)
pO2, Ven: 36 mmHg (ref 32.0–45.0)

## 2019-05-16 LAB — BRAIN NATRIURETIC PEPTIDE: B Natriuretic Peptide: 995.4 pg/mL — ABNORMAL HIGH (ref 0.0–100.0)

## 2019-05-16 LAB — GLUCOSE, CAPILLARY
Glucose-Capillary: 142 mg/dL — ABNORMAL HIGH (ref 70–99)
Glucose-Capillary: 93 mg/dL (ref 70–99)

## 2019-05-16 SURGERY — ATRIAL FIBRILLATION ABLATION
Anesthesia: General

## 2019-05-16 MED ORDER — PROPOFOL 10 MG/ML IV BOLUS
INTRAVENOUS | Status: DC | PRN
  Administered 2019-05-16: 50 mg via INTRAVENOUS
  Administered 2019-05-16: 80 mg via INTRAVENOUS

## 2019-05-16 MED ORDER — HEPARIN SODIUM (PORCINE) 1000 UNIT/ML IJ SOLN
INTRAMUSCULAR | Status: AC
  Filled 2019-05-16: qty 1

## 2019-05-16 MED ORDER — ONDANSETRON HCL 4 MG/2ML IJ SOLN
INTRAMUSCULAR | Status: DC | PRN
  Administered 2019-05-16: 4 mg via INTRAVENOUS

## 2019-05-16 MED ORDER — EPINEPHRINE HCL 5 MG/250ML IV SOLN IN NS
0.5000 ug/min | INTRAVENOUS | Status: DC
  Filled 2019-05-16: qty 250

## 2019-05-16 MED ORDER — MIDAZOLAM 50MG/50ML (1MG/ML) PREMIX INFUSION
2.0000 mg/h | INTRAVENOUS | Status: DC
  Administered 2019-05-16 – 2019-05-17 (×2): 2 mg/h via INTRAVENOUS
  Filled 2019-05-16: qty 50

## 2019-05-16 MED ORDER — APIXABAN 5 MG PO TABS
5.0000 mg | ORAL_TABLET | Freq: Two times a day (BID) | ORAL | Status: DC
  Administered 2019-05-16 – 2019-05-29 (×26): 5 mg via ORAL
  Filled 2019-05-16 (×26): qty 1

## 2019-05-16 MED ORDER — SODIUM CHLORIDE 0.9% FLUSH
3.0000 mL | Freq: Two times a day (BID) | INTRAVENOUS | Status: DC
  Administered 2019-05-17 – 2019-05-28 (×16): 3 mL via INTRAVENOUS

## 2019-05-16 MED ORDER — HEPARIN SODIUM (PORCINE) 1000 UNIT/ML IJ SOLN
INTRAMUSCULAR | Status: DC | PRN
  Administered 2019-05-16: 1000 [IU] via INTRAVENOUS

## 2019-05-16 MED ORDER — HEPARIN (PORCINE) IN NACL 1000-0.9 UT/500ML-% IV SOLN
INTRAVENOUS | Status: AC
  Filled 2019-05-16: qty 500

## 2019-05-16 MED ORDER — NOREPINEPHRINE 4 MG/250ML-% IV SOLN
INTRAVENOUS | Status: AC
  Filled 2019-05-16: qty 250

## 2019-05-16 MED ORDER — FENTANYL CITRATE (PF) 100 MCG/2ML IJ SOLN
INTRAMUSCULAR | Status: AC
  Filled 2019-05-16: qty 2

## 2019-05-16 MED ORDER — SODIUM BICARBONATE 8.4 % IV SOLN
INTRAVENOUS | Status: DC | PRN
  Administered 2019-05-16 (×2): 50 meq via INTRAVENOUS

## 2019-05-16 MED ORDER — SODIUM CHLORIDE 0.9% FLUSH
3.0000 mL | INTRAVENOUS | Status: DC | PRN
  Administered 2019-05-19 – 2019-05-25 (×2): 3 mL via INTRAVENOUS

## 2019-05-16 MED ORDER — FUROSEMIDE 10 MG/ML IJ SOLN
80.0000 mg | Freq: Once | INTRAMUSCULAR | Status: AC
  Administered 2019-05-16: 16:00:00 80 mg via INTRAVENOUS

## 2019-05-16 MED ORDER — LIDOCAINE 2% (20 MG/ML) 5 ML SYRINGE
INTRAMUSCULAR | Status: DC | PRN
  Administered 2019-05-16: 60 mg via INTRAVENOUS

## 2019-05-16 MED ORDER — LIDOCAINE-EPINEPHRINE 1 %-1:100000 IJ SOLN
INTRAMUSCULAR | Status: AC
  Filled 2019-05-16: qty 1

## 2019-05-16 MED ORDER — MIDAZOLAM HCL 2 MG/2ML IJ SOLN
INTRAMUSCULAR | Status: AC
  Filled 2019-05-16: qty 2

## 2019-05-16 MED ORDER — BUPIVACAINE HCL (PF) 0.25 % IJ SOLN
INTRAMUSCULAR | Status: AC
  Filled 2019-05-16: qty 30

## 2019-05-16 MED ORDER — ACETAMINOPHEN 325 MG PO TABS
650.0000 mg | ORAL_TABLET | ORAL | Status: DC | PRN
  Administered 2019-05-18 – 2019-05-27 (×14): 650 mg via ORAL
  Filled 2019-05-16 (×14): qty 2

## 2019-05-16 MED ORDER — SODIUM CHLORIDE 0.9 % IV SOLN: INTRAVENOUS | Status: DC

## 2019-05-16 MED ORDER — VASOPRESSIN 20 UNIT/ML IV SOLN
INTRAVENOUS | Status: DC | PRN
  Administered 2019-05-16: 1 [IU] via INTRAVENOUS
  Administered 2019-05-16: 5 [IU] via INTRAVENOUS
  Administered 2019-05-16: 1 [IU] via INTRAVENOUS
  Administered 2019-05-16: 2 [IU] via INTRAVENOUS
  Administered 2019-05-16: 1 [IU] via INTRAVENOUS
  Administered 2019-05-16: 2 [IU] via INTRAVENOUS
  Administered 2019-05-16: 8 [IU] via INTRAVENOUS

## 2019-05-16 MED ORDER — CHLORHEXIDINE GLUCONATE CLOTH 2 % EX PADS
6.0000 | MEDICATED_PAD | Freq: Every day | CUTANEOUS | Status: DC
  Administered 2019-05-17 – 2019-05-29 (×12): 6 via TOPICAL

## 2019-05-16 MED ORDER — FENTANYL CITRATE (PF) 250 MCG/5ML IJ SOLN
INTRAMUSCULAR | Status: DC | PRN
  Administered 2019-05-16: 12:00:00 50 ug via INTRAVENOUS

## 2019-05-16 MED ORDER — FUROSEMIDE 10 MG/ML IJ SOLN
120.0000 mg | Freq: Once | INTRAVENOUS | Status: AC
  Administered 2019-05-17: 120 mg via INTRAVENOUS
  Filled 2019-05-16: qty 10

## 2019-05-16 MED ORDER — DOBUTAMINE IN D5W 4-5 MG/ML-% IV SOLN
INTRAVENOUS | Status: DC | PRN
  Administered 2019-05-16: 20 ug/kg/min via INTRAVENOUS

## 2019-05-16 MED ORDER — FENTANYL 2500MCG IN NS 250ML (10MCG/ML) PREMIX INFUSION
100.0000 ug/h | INTRAVENOUS | Status: DC
  Administered 2019-05-16: 100 ug/h via INTRAVENOUS

## 2019-05-16 MED ORDER — EPINEPHRINE PF 1 MG/ML IJ SOLN
INTRAMUSCULAR | Status: DC | PRN
  Administered 2019-05-16: .1 mg via INTRAVENOUS

## 2019-05-16 MED ORDER — SODIUM CHLORIDE 0.9 % IV SOLN
INTRAVENOUS | Status: DC
  Administered 2019-05-16: 09:00:00 via INTRAVENOUS

## 2019-05-16 MED ORDER — HEPARIN SODIUM (PORCINE) 1000 UNIT/ML IJ SOLN
INTRAMUSCULAR | Status: DC | PRN
  Administered 2019-05-16: 1000 [IU] via INTRAVENOUS
  Administered 2019-05-16: 2000 [IU] via INTRAVENOUS
  Administered 2019-05-16: 14000 [IU] via INTRAVENOUS
  Administered 2019-05-16: 4000 [IU] via INTRAVENOUS

## 2019-05-16 MED ORDER — HEPARIN (PORCINE) IN NACL 1000-0.9 UT/500ML-% IV SOLN
INTRAVENOUS | Status: DC | PRN
  Administered 2019-05-16 (×5): 500 mL

## 2019-05-16 MED ORDER — ALBUTEROL SULFATE (2.5 MG/3ML) 0.083% IN NEBU: 2.5000 mg | INHALATION_SOLUTION | Freq: Four times a day (QID) | RESPIRATORY_TRACT | Status: DC | PRN

## 2019-05-16 MED ORDER — DOFETILIDE 500 MCG PO CAPS
500.0000 ug | ORAL_CAPSULE | Freq: Two times a day (BID) | ORAL | Status: DC
  Administered 2019-05-16 – 2019-05-17 (×2): 500 ug via ORAL
  Filled 2019-05-16 (×2): qty 1

## 2019-05-16 MED ORDER — ROCURONIUM BROMIDE 50 MG/5ML IV SOSY
PREFILLED_SYRINGE | INTRAVENOUS | Status: DC | PRN
  Administered 2019-05-16: 50 mg via INTRAVENOUS
  Administered 2019-05-16: 60 mg via INTRAVENOUS
  Administered 2019-05-16: 20 mg via INTRAVENOUS

## 2019-05-16 MED ORDER — FENTANYL 2500MCG IN NS 250ML (10MCG/ML) PREMIX INFUSION
INTRAVENOUS | Status: AC
  Filled 2019-05-16: qty 250

## 2019-05-16 MED ORDER — ONDANSETRON HCL 4 MG/2ML IJ SOLN
4.0000 mg | Freq: Four times a day (QID) | INTRAMUSCULAR | Status: DC | PRN
  Administered 2019-05-21: 4 mg via INTRAVENOUS
  Filled 2019-05-16: qty 2

## 2019-05-16 MED ORDER — MIDAZOLAM 50MG/50ML (1MG/ML) PREMIX INFUSION
INTRAVENOUS | Status: AC
  Filled 2019-05-16: qty 50

## 2019-05-16 MED ORDER — GLYBURIDE 5 MG PO TABS
5.0000 mg | ORAL_TABLET | Freq: Every day | ORAL | Status: DC
  Administered 2019-05-17: 5 mg via ORAL
  Filled 2019-05-16: qty 1

## 2019-05-16 MED ORDER — SODIUM CHLORIDE 0.9 % IV SOLN
250.0000 mL | INTRAVENOUS | Status: DC | PRN
  Administered 2019-05-19: 250 mL via INTRAVENOUS

## 2019-05-16 MED ORDER — DOBUTAMINE IN D5W 4-5 MG/ML-% IV SOLN
INTRAVENOUS | Status: AC
  Filled 2019-05-16: qty 250

## 2019-05-16 MED ORDER — BUPIVACAINE HCL (PF) 0.25 % IJ SOLN
INTRAMUSCULAR | Status: DC | PRN
  Administered 2019-05-16: 20 mL

## 2019-05-16 MED ORDER — ALBUMIN HUMAN 5 % IV SOLN
INTRAVENOUS | Status: DC | PRN
  Administered 2019-05-16 (×3): via INTRAVENOUS

## 2019-05-16 MED ORDER — DOBUTAMINE IN D5W 4-5 MG/ML-% IV SOLN: 5.0000 ug/kg/min | INTRAVENOUS | Status: DC

## 2019-05-16 MED ORDER — FUROSEMIDE 10 MG/ML IJ SOLN
INTRAMUSCULAR | Status: AC
  Filled 2019-05-16: qty 4

## 2019-05-16 MED ORDER — VASOPRESSIN 20 UNIT/ML IV SOLN
0.0300 [IU]/min | INTRAVENOUS | Status: DC
  Filled 2019-05-16: qty 2

## 2019-05-16 MED ORDER — PHENYLEPHRINE HCL-NACL 10-0.9 MG/250ML-% IV SOLN
INTRAVENOUS | Status: DC | PRN
  Administered 2019-05-16: 25 ug/min via INTRAVENOUS

## 2019-05-16 MED ORDER — NOREPINEPHRINE 4 MG/250ML-% IV SOLN
0.0000 ug/min | INTRAVENOUS | Status: DC
  Administered 2019-05-16: 16:00:00 2 ug/min via INTRAVENOUS
  Administered 2019-05-17: 3 ug/min via INTRAVENOUS
  Filled 2019-05-16 (×2): qty 250

## 2019-05-16 MED ORDER — MIDAZOLAM HCL 5 MG/5ML IJ SOLN
INTRAMUSCULAR | Status: DC | PRN
  Administered 2019-05-16: 1 mg via INTRAVENOUS

## 2019-05-16 MED ORDER — ALBUTEROL SULFATE HFA 108 (90 BASE) MCG/ACT IN AERS
INHALATION_SPRAY | RESPIRATORY_TRACT | Status: DC | PRN
  Administered 2019-05-16: 4 via RESPIRATORY_TRACT

## 2019-05-16 SURGICAL SUPPLY — 25 items
BLANKET WARM UNDERBOD FULL ACC (MISCELLANEOUS) ×4 IMPLANT
CATH MAPPNG PENTARAY F 2-6-2MM (CATHETERS) ×2 IMPLANT
CATH SMTCH THERMOCOOL SF DF (CATHETERS) ×4 IMPLANT
CATH SOUNDSTAR ECO REPROCESSED (CATHETERS) ×4 IMPLANT
CATH SWAN GANZ VIP 7.5F (CATHETERS) ×4 IMPLANT
CATH WEBSTER BI DIR CS D-F CRV (CATHETERS) ×4 IMPLANT
COVER SWIFTLINK CONNECTOR (BAG) ×4 IMPLANT
DEVICE CLOSURE PERCLS PRGLD 6F (VASCULAR PRODUCTS) ×8 IMPLANT
LOOP REVEAL LINQ LNQ11 (Prosthesis & Implant Heart) ×4 IMPLANT
PACK EP LATEX FREE (CUSTOM PROCEDURE TRAY) ×2
PACK EP LF (CUSTOM PROCEDURE TRAY) ×2 IMPLANT
PACK LOOP INSERTION (CUSTOM PROCEDURE TRAY) ×4 IMPLANT
PAD PRO RADIOLUCENT 2001M-C (PAD) ×4 IMPLANT
PATCH CARTO3 (PAD) ×4 IMPLANT
PENTARAY F 2-6-2MM (CATHETERS) ×4
PERCLOSE PROGLIDE 6F (VASCULAR PRODUCTS) ×16
SHEATH AVANTI 11F 11CM (SHEATH) ×4 IMPLANT
SHEATH BAYLIS SUREFLEX  M 8.5 (SHEATH) ×4
SHEATH BAYLIS SUREFLEX M 8.5 (SHEATH) ×4 IMPLANT
SHEATH BAYLIS TRANSSEPTAL 98CM (NEEDLE) ×4 IMPLANT
SHEATH PINNACLE 7F 10CM (SHEATH) ×4 IMPLANT
SHEATH PINNACLE 8F 10CM (SHEATH) ×12 IMPLANT
SHEATH PROBE COVER 6X72 (BAG) ×8 IMPLANT
SLEEVE REPOSITIONING LENGTH 30 (MISCELLANEOUS) ×4 IMPLANT
TUBING SMART ABLATE COOLFLOW (TUBING) ×4 IMPLANT

## 2019-05-16 NOTE — Progress Notes (Signed)
Mr. Maga has had a steady deterioration in functional status since his last appointment. Now NYHA class 3. 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

## 2019-05-16 NOTE — Research (Signed)
Alleviate Research Study  Device implanted at this time.  Device: Reveal LINQ [id number] SN: [id number] S Exp: 02-02-2020

## 2019-05-16 NOTE — H&P (Signed)
[name] has presented today for surgery, with the diagnosis of atrial fibrillation.  The various methods of treatment have been discussed with the patient and family. After consideration of risks, benefits and other options for treatment, the patient has consented to  Procedure(s): Catheter ablation as a surgical intervention .  Risks include but not limited to bleeding, tamponade, heart block, stroke, damage to surrounding organs, among others. The patient's history has been reviewed, patient examined, no change in status, stable for surgery.  I have reviewed the patient's chart and labs.  Questions were answered to the patient's satisfaction.    Lamond Glantz Curt Bears, MD 05/16/2019 10:56 AM

## 2019-05-16 NOTE — Anesthesia Procedure Notes (Signed)
Arterial Line Insertion Start/End12/02/2019 3:20 PM, 05/16/2019 3:25 PM Performed by: Lance Coon, CRNA, CRNA  Patient location: OR. Preanesthetic checklist: patient identified, IV checked, site marked, risks and benefits discussed, surgical consent, monitors and equipment checked, pre-op evaluation, timeout performed and anesthesia consent Lidocaine 1% used for infiltration Left, radial was placed Catheter size: 20 G Hand hygiene performed , maximum sterile barriers used  and Seldinger technique used  Attempts: 2 Procedure performed using ultrasound guided technique. Ultrasound Notes:anatomy identified, needle tip was noted to be adjacent to the nerve/plexus identified and no ultrasound evidence of intravascular and/or intraneural injection Following insertion, dressing applied and Biopatch. Post procedure assessment: normal and unchanged  Patient tolerated the procedure well with no immediate complications.

## 2019-05-16 NOTE — Procedures (Signed)
LOOP RECORDER IMPLANT   Procedure report  Procedure performed:  Loop recorder implantation   Reason for procedure:  ALLEVIATE-HF clinical trial Procedure performed by:  Sanda Klein, MD  Complications:  None  Estimated blood loss:  <5 mL  Medications administered during procedure:  Lidocaine 1% with 1/10,000 epinephrine 10 mL locally Device details:  Med[employer]onic Reveal Linq model number [id number], serial number [id number] Procedure details:  After the risks and benefits of the procedure were discussed the patient provided informed consent. The patient was prepped and draped in usual sterile fashion. Local anesthesia was administered to an area 2 cm to the left of the sternum in the 4th intercostal space. A cutaneous incision was made using the incision tool. The introducer was then used to create a subcutaneous tunnel and carefully deploy the device. Local pressure was held to ensure hemostasis.  The incision was closed with SteriStrips and a sterile dressing was applied.  R waves 1.0 mV.  The ALLEVIATE-HF study is a prospective, randomized, multi-site, interventional, investigational device exemption (IDE) study. The purpose of the study is to gain experience with utilization of an integrated device diagnostic-based risk stratification algorithm to guide patient care in subjects with NYHA class II and III heart failure. The study will utilize the market released Reveal [facility] Insertable Cardiac Monitor with an investigational ALLEVIATE-HF [employer]. The study will enroll up to 300 subjects at up to 30 sites in the [place], and will characterize the safety of the patient management pathway. Subjects will be followed for a minimum of 7 months, and until study closure. For questions call: [phone].   Sanda Klein, MD, [facility] [phone] office [phone] pager 05/16/2019 6:12 PM

## 2019-05-16 NOTE — Anesthesia Preprocedure Evaluation (Addendum)
Anesthesia Evaluation  Patient identified by MRN, date of birth, ID band Patient awake    Reviewed: Allergy & Precautions, NPO status , Patient's Chart, lab work & pertinent test results  History of Anesthesia Complications Negative for: history of anesthetic complications  Airway Mallampati: II  TM Distance: >3 FB Neck ROM: Full    Dental  (+) Dental Advisory Given, Partial Upper   Pulmonary shortness of breath, asthma , sleep apnea and Continuous Positive Airway Pressure Ventilation , former smoker,     + decreased breath sounds      Cardiovascular hypertension, Pt. on home beta blockers and Pt. on medications +CHF  + dysrhythmias Atrial Fibrillation + Valvular Problems/Murmurs MR and AI  Rhythm:Irregular Rate:Tachycardia   09/2017 Limited TTE - EF 42% (partial improvement)  06/2017 TTE - Moderate concentric LVH. EF 30% to 35%. Severe diffuse hypokinesis with distinct regional wall motion abnormalities. There is akinesis of the apical myocardium. Mild-mod AI. Aortic root dimension: 40 mm (ED). The ascending aorta was mildly dilated. Moderate MR. LA and RA were mildly dilated. RV was mildly dilated. PA peak pressure: 43 mm Hg     Neuro/Psych PSYCHIATRIC DISORDERS Anxiety negative neurological ROS     GI/Hepatic negative GI ROS, Neg liver ROS,   Endo/Other  diabetes, Type 2, Oral Hypoglycemic Agents  Renal/GU negative Renal ROS     Musculoskeletal negative musculoskeletal ROS (+)   Abdominal   Peds  Hematology  (+) anemia ,   Anesthesia Other Findings Covid negative 12/5  Reproductive/Obstetrics                            Anesthesia Physical Anesthesia Plan  ASA: III  Anesthesia Plan: General   Post-op Pain Management:    Induction: Intravenous  PONV Risk Score and Plan: 2 and Treatment may vary due to age or medical condition, Ondansetron and Dexamethasone  Airway Management  Planned: Oral ETT  Additional Equipment: None  Intra-op Plan:   Post-operative Plan: Extubation in OR  Informed Consent: I have reviewed the patients History and Physical, chart, labs and discussed the procedure including the risks, benefits and alternatives for the proposed anesthesia with the patient or authorized representative who has indicated his/her understanding and acceptance.     Dental advisory given  Plan Discussed with: CRNA and Anesthesiologist  Anesthesia Plan Comments:        Anesthesia Quick Evaluation

## 2019-05-16 NOTE — Research (Addendum)
Alleviate Research Study  ? ?  Patient has NYHA Class II or III heart failure per most recent assessment, irrespective of left ventricular ejection fraction (LVEF)    ? ?  Patient has documented recent history of symptomatic heart failure, defined as meeting any one of the following three criteria:   ? ? 1. Hospital admission with primary diagnosis of HF within the last 12 months, OR    ? ? 2. I2. Intravenous HF therapy (e.g. IV diuretics/vasodilators) or ultrafiltration within the last 6 months, OR    ? ? 3. Patient had the following BNP/NT-proBNP1 within the last 3 months:    ? ? - If LVEF ? 50%, then BNP> 150 pg/ml or NT-proBNP > 450 pg/ml OR  - If LVEF is <50%, then BNP> 300 pg/ml or NT-proBNP > 900 pg/ml    ? ? - If LVEF ? 50%, then BNP> 150 pg/ml or NT-proBNP > 450 pg/ml OR  - If LVEF is <50%, then BNP> 300 pg/ml or NT-proBNP > 900 pg/ml    ? ? Patient is willing and able to comply with the protocol, including LINQ ICM insertion, CareLink transmissions (including adequate connectivity), study visits and remote care directions     ? ?  Patient is 66 years of age or older    ? ?  Patient has a life expectancy of 12 months or more    Yes No Exclusion  ? ? Patient is currently implanted with a cardiovascular implantable electronic device (CIED)2 (e.g. ICM3, pacemaker, ICD, CRT-D or CRT-P device) or hemodynamic monitor   ? ?  Patient is receiving temporary or permanent mechanical circulatory support    ? ? Patient had MI or PCI/CABG within past 90 days   ? ?  Patient has had a heart transplant or is currently on heart transplant list    ? ?  Patient has primary pulmonary hypertension (pre-capillary, WHO group 1,3,4,5)    ? ? Patient has severe valve stenosis on echocardiogram    ? ?  Patient has severe renal impairment (eGFR <30 mL/min)    ? ?  Patient has systolic blood pressure of < 90 mmHg at the time of enrollment    ? ?  Patient is on chronic renal dialysis     ? ? Patient is unable to undergo one round of PRN diuretic medication intervention (i.e. 4 days of increased diuretics dose), based on the judgement of the investigator (e.g. known history of side effects or intolerance to PRN dosing of diuretics)  ? ?  Patient has liver disease, defined as AST/ALT > 5x normal, or bilirubin >2x normal    ? ?  Patient has serum albumin < 3 g/dL    ? ? Patient has hypertrophic obstructive cardiomyopathy, constrictive pericarditis or amyloidosis   ? ?  Patient has complex adult congenital heart disease    ? ?  Patient has active cancer involving chemotherapy and/or radiation therapy    ? ?  Patient weighs more than 500 pounds    ? ? Patient is pregnant (all females of child-bearing potential must have a negative pregnancy test within 1 week of enrollment)   ? ?  . Patient is enrolled in another interventional study    ? ?  Patient is on chronic intravenous inotropic drug therapy (e.g. dobutamine, milrinone)       [id number]  MOBILITY:    I HAVE NO PROBLEMS WALKING [x]  I HAVE SLIGHT PROBLEMS WALKING []  I HAVE MODERATE  PROBLEMS WALKING []  I HAVE SEVERE PROBLEMS WALKING []  I AM UNABLE TO WALK  []    SELF-CARE:   I HAVE NO PROBLEMS WASING OR DRESSING MYSELF  []  I HAVE SLIGHT PROBLEMS WASHING OR DRESSING MYSELF  [x]  I HAVE MODERATE PROBLEMS WASHING OR DRESSING MYSELF []  I HAVE SEVERE PROBLEMS WASHING OR DRESSING MYSELF  []  I HAVE SEVERE PROBLEMS WASHING OR DRESSING MYSELF  []  I AM UNABLE TO [place] OR DRESS MYSELF []    USUAL ACTIVITIES: (E.G. WORK/STUDY/HOUSEWORK/FAMILY OR LEISURE ACTIVITIES.    I HAVE NO PROBLEMS DOING MY USUAL ACTIVITIES []  I HAVE SLIGHT PROBLEMS DOING MY USUAL ACTIVITIES [x]  I HAVE MODERATE PROBLEMS DOING MY USUAL ACTIVIITIES []  I HAVE SEVERE PROBLEMS DOING MY USUAL ACTIVITIES []  I AM UNABLE TO DO MY USUAL ACTIVITIES []    PAIN /DISCOMFORT   I HAVE NO PAIN OR DISCOMFORT [x]  I HAVE SLIGHT PAIN OR DISCOMFORT  []  I HAVE MODERATE PAIN OR DISCOMFORT []  I HAVE SEVERE PAIN OR DISCOMFORT []  I HAVE EXTREME PAIN OR DISCOMFORT []    ANXIETY/DEPRESSION   I AM NOT ANXIOUS OR DEPRESSED []  I AM SLIGHTLY ANXIOUS OR DEPRESSED [x]  I AM MODERATELY ANXIOUS OR DREPRESSED []  I AM SEVERELY ANXIOUS OR DEPRESSED []  I AM EXTREMELY ANXIOUS OR DEPRESSED []    SCALE OF 0-100 HOW WOULD YOU RATE TODAY?  0 IS THE WORSE AND 100 IS THE BEST HEALTH YOU CAN IMAGINE: 20   6 MWT Patient walked 280 feet in six minutes, no walking assistance equipment needed. Patient experienced minimal shortness of breath and was able to carry on conversation during walk.   NYHA scale 2-3 per Dr Orene Desanctis

## 2019-05-16 NOTE — Consult Note (Signed)
NAME:  [name], MRN:  [id number], DOB:  March 07, 1951, LOS: 0 ADMISSION DATE:  05/16/2019, CONSULTATION DATE:  12/9 REFERRING MD:  Dr. Curt Bears, CHIEF COMPLAINT:  Cardiogenic shock    Brief History   68 year old male with hx of HFpEF, Afib/ Aflutter currently on Eliquis and dofetilide, AI, HTN, OSA, and DMT2 admitted 12/9 for elective catheter ablation for atrial flutter.  Developed cardiogenic shock post AF ablation and returns to ICU on vasopressors and mechanical ventilation.   History of present illness   HPI obtained from medical chart review as patient is intubated and sedated.    68 year old male with hx of  HFpEF, Afib/ Aflutter currently on Eliquis and dofetilide, AI, HTN, OSA, and DMT2 admitted 12/9 for elective catheter ablation for atrial flutter.  Intubated for procedure.  Developed cardiogenic shock post AF ablation requiring multiple pressors.  HF team consulted.  RHC and PA catheter placed.  Returns to ICU on vasopressors and mechanical ventilation.  PCCM consulted for vent management.   Past Medical History  Diastolic HF, Afib, Aflutter, aortic insufficiency, HTN, OSA, HLD, DMT2, former smoker  Significant Hospital Events   12/9 Admitted by EP  Consults:  PCCM  HF  Procedures:  12/9 ETT >> 12/9 TEE  12/9 RHC/ catheter ablation   Significant Diagnostic Tests:  12/9 TEE >> 1. Left ventricular ejection fraction, by visual estimation, is 10%. The left ventricle has severely decreased function. Normal left ventricular size. There is moderately increased left ventricular hypertrophy. 2. Left ventricular diastolic function could not be evaluated. 3. The left ventricle demonstrates global hypokinesis. 4. Global right ventricle has moderately reduced systolic function.The right ventricular size is mildly enlarged. No increase in right ventricular wall thickness. 5. Left atrial size was severely dilated. 6. Right atrial size was severely dilated. 7. The  mitral valve is normal in structure. Mild mitral valve regurgitation. 8. The tricuspid valve is normal in structure. Tricuspid valve regurgitation is trivial. 9. Aortic valve regurgitation is moderate. 10. The aortic valve is normal in structure. Aortic valve regurgitation is moderate. Mild aortic valve sclerosis without stenosis. 11. The pulmonic valve was grossly normal. Pulmonic valve regurgitation is not visualized. 12. There is mild dilatation of the aortic root measuring 41 mm. 13. Atheromatous plaque, without ulcerated or mobile components, is seen in the distal aortic arch. 14. No intracardiac thrombi or masses were visualized.  12/9 RHC >> On dobutamine 5 and NE 10 RA = 15  RV = 47/17 PA = 49/32 (41) PCW = 27 Fick cardiac output/index = 5.8/2.8 PVR = 2.4 WU FA sat = 94% PA sat = 67%, 68% Assessment: 1. Marked volume overload 2. Resuscitated cardiogenic shock with inotrope support  Micro Data:  12/5 SARS >> negative  Antimicrobials:  n/a   Interim history/subjective:  Unresponsive   Objective   Blood pressure 117/71, pulse 96, temperature (!) 97.5 F (36.4 C), temperature source Temporal, resp. rate 18, height _0  (1.803 m), weight 86.1 kg, SpO2 (!) 0 %.    Vent Mode: PRVC FiO2 (%):  [70 %] 70 % Set Rate:  [16 bmp] 16 bmp Vt Set:  [600 mL] 600 mL PEEP:  [8 cmH20] 8 cmH20 Plateau Pressure:  [22 cmH20] 22 cmH20   Intake/Output Summary (Last 24 hours) at 05/16/2019 1708 Last data filed at 05/16/2019 1700 Gross per 24 hour  Intake 1550 ml  Output 250 ml  Net 1300 ml   Filed Weights   05/16/19 0838  Weight: 86.1  kg    Examination: General: Chronically ill appearing elderly maleon mechanical ventilation, in NAD HEENT: ETT, MM pink/moist, PERRL,  Neuro: Sedated on vent CV: s1s2 regular rate and rhythm, no murmur, rubs, or gallops,  PULM:  Clear breath sounds bilaterally, no increased work of breathing  GI: soft, bowel sounds active in all 4  quadrants, non-tender, non-distended, tolerating Extremities: warm/dry, no edema  Skin: no rashes or lesions   Resolved Hospital Problem list     Assessment & Plan:   .Acute Hypoxic Respiratory Failure  -In the setting of cardiogenic shock post elective ablation  P: Continue ventilator support with lung protective strategies  Wean PEEP and FiO2 for sats greater than 90%. Head of bed elevated 30 degrees. Plateau pressures less than 30 cm H20.  Follow intermittent chest x-ray and ABG.   SAT/SBT as tolerated, mentation preclude extubation  Ensure adequate pulmonary hygiene  Follow cultures  VAP bundle in place  PAD protocol  Cardiogenic shock  HX of systolic CHF HX of A-fib -Outpatient TEE and right heart cath preformed today which reveled severe volume overload resulting in cardiogenic shock requiring inotropic support. -Biventricular failure, LVEF 10%, RVEF moderately reduced as well and Aortic valve regurgitation is moderate   P: Primary management per cardiology Continue Inotropic support  MAP goal greater then 65, continue pressor support  Close monitoring of hemodynamics with swans catheter   HX of OSA with night CPAP -Recently seen at outpatient pulmonary setting with change in CPAP pressure to 10cm H2O -Primary pulmonologist is Dr. Elsworth Soho  P: Once extubated resume home CPAP at HS    Management of multiple chronic medical conditions per primary team   Best practice:  Diet: NPO Pain/Anxiety/Delirium protocol (if indicated): PRN fentanyl  VAP protocol (if indicated): in place  DVT prophylaxis: Per primary  GI prophylaxis: PPI Glucose control: SSI Mobility: Bedrest  Code Status: Full Family Communication: per primary  Disposition: ICU  Labs   CBC: Recent Labs  Lab 05/16/19 1330 05/16/19 1512 05/16/19 1532 05/16/19 1609  HGB 12.6* 11.9* 11.9* 11.6*   11.2*  HCT 37.0* 35.0* 35.0* 34.0*   33.0*    Basic Metabolic Panel: Recent Labs  Lab 05/16/19 1330  05/16/19 1512 05/16/19 1532 05/16/19 1609  NA 145 146* 145 150*   148*  K 4.5 4.0 4.4 4.1   4.1   GFR: Estimated Creatinine Clearance: 39.3 mL/min (A) (by C-G formula based on SCr of 1.97 mg/dL (H)). No results for input(s): PROCALCITON, WBC, LATICACIDVEN in the last 168 hours.  Liver Function Tests: No results for input(s): AST, ALT, ALKPHOS, BILITOT, PROT, ALBUMIN in the last 168 hours. No results for input(s): LIPASE, AMYLASE in the last 168 hours. No results for input(s): AMMONIA in the last 168 hours.  ABG    Component Value Date/Time   PHART 7.290 (L) 05/16/2019 1532   PCO2ART 37.1 05/16/2019 1532   PO2ART 90.0 05/16/2019 1532   HCO3 25.3 05/16/2019 1609   HCO3 24.8 05/16/2019 1609   TCO2 27 05/16/2019 1609   TCO2 26 05/16/2019 1609   ACIDBASEDEF 1.0 05/16/2019 1609   O2SAT 67.0 05/16/2019 1609   O2SAT 66.0 05/16/2019 1609     Coagulation Profile: No results for input(s): INR, PROTIME in the last 168 hours.  Cardiac Enzymes: No results for input(s): CKTOTAL, CKMB, CKMBINDEX, TROPONINI in the last 168 hours.  HbA1C: Hgb A1c MFr Bld  Date/Time Value Ref Range Status  02/06/2019 12:17 PM 5.8 4.6 - 6.5 % Final  Comment:    Glycemic Control Guidelines for People with Diabetes:Non Diabetic:  <6%Goal of Therapy: <7%Additional Action Suggested:  >8%   08/03/2018 02:18 PM 5.7 4.6 - 6.5 % Final    Comment:    Glycemic Control Guidelines for People with Diabetes:Non Diabetic:  <6%Goal of Therapy: <7%Additional Action Suggested:  >8%     CBG: Recent Labs  Lab 05/16/19 0847  GLUCAP 142*    Review of Systems:   Unable to assess due to unresponsiveness and ventilation  Past Medical History  He,  has a past medical history of AORTIC STENOSIS, MODERATE (12/05/2009), Atrial fibrillation ([place]), Atrial fibrillation with RVR ([place]) (07/05/2017), Atrial flutter ([place]) (12/05/2009), Atypical chest pain (11/08/2011), BENIGN PROSTATIC HYPERTROPHY, HX OF (12/05/2009), CHEST  PAIN-UNSPECIFIED (11/11/2009), CHF (12/05/2009), Chronic renal insufficiency (12/16/2016), Debility (04/18/2012), Diarrhea (12/16/2016), DYSPNEA (12/19/2009), Edema (04/18/2012), ERECTILE DYSFUNCTION (01/25/2007), FATIGUE, CHRONIC (11/11/2009), Gout (07/10/2012), Heart murmur, History of kidney stones, Hyperkalemia (04/18/2012), Hyperlipidemia, Hypertension, HYPERTENSION (01/25/2007), Hypoxia (05/05/2011), Insomnia (05/13/2016), INSOMNIA, HX OF (01/25/2007), Knee pain, bilateral (12/28/2010), Knee pain, right (12/28/2010), NEUROMA (01/05/2010), OBESITY NOS (01/25/2007), OSA on CPAP (04/06/2010), PERIPHERAL NEUROPATHY (01/05/2010), PULMONARY FUNCTION TESTS, ABNORMAL (02/02/2010), Superficial thrombophlebitis of left leg (09/07/2011), TESTICULAR HYPOFUNCTION (01/05/2010), 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   Surgical History    Past Surgical History:  Procedure Laterality Date   CARDIAC CATHETERIZATION  10/16/2009   nonischemic cardiomyopathy   CARDIOVERSION N/A 03/25/2015   Procedure: CARDIOVERSION;  Surgeon: Pixie Casino, MD;  Location: [facility];  Service: Cardiovascular;  Laterality: N/A;   CARDIOVERSION N/A 06/17/2017   Procedure: CARDIOVERSION;  Surgeon: Sanda Klein, MD;  Location: [facility];  Service: Cardiovascular;  Laterality: N/A;   CARDIOVERSION N/A 07/08/2017   Procedure: CARDIOVERSION;  Surgeon: Fay Records, MD;  Location: [facility] ENDOSCOPY;  Service: Cardiovascular;  Laterality: N/A;   CARDIOVERSION N/A 02/27/2019   Procedure: CARDIOVERSION;  Surgeon: Sanda Klein, MD;  Location: [facility] ENDOSCOPY;  Service: Cardiovascular;  Laterality: N/A;   METATARSAL OSTEOTOMY Bilateral ~ 1980   removed part of 5th metatarsal to corect curvature of toes      Social History   reports that he quit smoking about 38 years ago. He has a 10.00 pack-year smoking history. He has never used smokeless tobacco. He reports  that he does not drink alcohol or use drugs.   Family History   His family history includes Aneurysm in his sister; Cancer in his father; Clotting disorder in his mother; Diabetes in his mother and sister; Heart attack in his mother; Heart disease in his mother; Hyperlipidemia in his mother; Hypertension in his mother and sister; Leukemia in his maternal grandmother; Lung disease in his father; Other in his sister; Seizures in his sister; Stroke in his mother.   Allergies Allergies  Allergen Reactions   Sulfonamide Derivatives Other (See Comments)    unknown reaction.  Patient states had to go to the ER.    Zolpidem Other (See Comments)    Excessive, prolonged sedation     Home Medications  Prior to Admission medications   Medication Sig Start Date End Date Taking? Authorizing Provider  allopurinol (ZYLOPRIM) 300 MG tablet Take 1 tablet by mouth once daily 04/02/19  Yes [name], MD  benazepril (LOTENSIN) 5 MG tablet Take 1/2 (one-half) tablet by mouth once daily Patient taking differently: Take 2.5 mg by mouth daily.  02/26/19  Yes [name], MD  calcium carbonate (TUMS - DOSED IN MG  ELEMENTAL CALCIUM) 500 MG chewable tablet Chew 1 tablet by mouth 3 (three) times daily as needed (heartburn/indigestion.).    Yes [provider]  cetirizine (ZYRTEC) 10 MG tablet Take 1 tablet (10 mg total) by mouth daily. 11/01/14  Yes [name], MD  Clobetasol Prop Emollient Base (CLOBETASOL PROPIONATE E) 0.05 % emollient cream Apply 1 application topically 2 (two) times daily as needed (insect bite). 12/16/16  Yes [name], MD  dofetilide (TIKOSYN) 500 MCG capsule TAKE 1 CAPSULE(500 MCG) BY MOUTH TWICE DAILY Patient taking differently: Take 500 mcg by mouth 2 (two) times daily.  05/01/19  Yes Croitoru, Mihai, MD  ELIQUIS 5 MG TABS tablet Take 1 tablet by mouth twice daily 02/26/19  Yes Croitoru, Mihai, MD  fish oil-omega-3 fatty acids 1000 MG capsule Take 1 g by mouth  daily.    Yes [provider]  furosemide (LASIX) 40 MG tablet TAKE TWO TABLETS BY MOUTH EVERY MORNING AND TAKE ONE TABLET BY MOUTH EVERY AFTERNOON Patient taking differently: Take 40-80 mg by mouth See admin instructions. TAKE TWO TABLETS BY MOUTH EVERY MORNING AND TAKE ONE TABLET BY MOUTH EVERY AFTERNOON 03/15/19  Yes Lyda Jester M, PA-C  glucose blood test strip (Verio) Use as instructed Check blood sugars TID DxE11.9 01/24/19  Yes [name], MD  glyBURIDE (DIABETA) 5 MG tablet Take 1 tablet by mouth once daily with breakfast Patient taking differently: Take 5 mg by mouth daily with breakfast.  04/02/19  Yes [name], MD  LORazepam (ATIVAN) 1 MG tablet Take 1 tablet (1 mg total) by mouth every 8 (eight) hours as needed for anxiety. 02/05/19  Yes [name], MD  magnesium oxide (MAG-OX) 400 MG tablet Take 1 tablet (400 mg total) by mouth daily. 04/18/15  Yes Barrett, Evelene Croon, PA-C  metFORMIN (GLUCOPHAGE) 500 MG tablet TAKE 1 TABLET BY MOUTH THREE TIMES DAILY 04/02/19  Yes [name], MD  metoprolol succinate (TOPROL-XL) 100 MG 24 hr tablet Take 1 tablet (100 mg total) by mouth 2 (two) times daily. 03/27/19  Yes Croitoru, Mihai, MD  metoprolol succinate (TOPROL-XL) 50 MG 24 hr tablet Take 3 tablets (150 mg total) by mouth 2 (two) times daily. Take with or immediately following a meal. Patient taking differently: Take 50 mg by mouth 2 (two) times daily. Take with or immediately following a meal. 03/27/19  Yes Croitoru, Mihai, MD  Multiple Vitamin (MULTIVITAMIN WITH MINERALS) TABS tablet Take 1 tablet by mouth daily.    Yes [provider]  potassium chloride SA (K-DUR,KLOR-CON) 20 MEQ tablet TAKE 1 TABLET BY MOUTH ONCE DAILY AND  EXTRA  TABLET  IF  WEIGHT  IS  ABOVE  210  LBS Patient taking differently: Take 20 mEq by mouth daily.  06/23/18  Yes Croitoru, Mihai, MD  pravastatin (PRAVACHOL) 40 MG tablet Take 1 tablet by mouth once daily 02/26/19  Yes Hilty,  Nadean Corwin, MD  temazepam (RESTORIL) 30 MG capsule Take 1 capsule (30 mg total) by mouth at bedtime. 02/05/19  Yes [name], MD  albuterol (VENTOLIN HFA) 108 (90 Base) MCG/ACT inhaler Inhale 2 puffs into the lungs every 6 (six) hours as needed for wheezing or shortness of breath. 12/16/16   [name], MD  Blood Glucose Monitoring Suppl (ONETOUCH VERIO) w/Device KIT Use to check blood sugar TID as directed.  Dx Code: E11.9 02/22/19   [name], MD  colchicine (COLCRYS) 0.6 MG tablet TAKE ONE TABLET BY MOUTH  DAILY AS NEEDED FOR GOUT FLARE UPS. 01/14/16   Croitoru, Dani Gobble, MD  HYDROcodone-acetaminophen (NORCO) 10-325 MG tablet Take 1 tablet by mouth every 8 (eight) hours as needed for moderate pain or severe pain. 08/03/18   [name], MD  meloxicam (MOBIC) 7.5 MG tablet Take 7.5 mg by mouth 2 (two) times daily as needed. 04/13/19   [provider]  naproxen (NAPROSYN) 375 MG tablet Take 375 mg by mouth daily as needed (pain).     [provider]     Critical care time:    Performed by: Johnsie Cancel   Total critical care time: 45 minutes  Critical care time was exclusive of separately billable procedures and treating other patients.  Critical care was necessary to treat or prevent imminent or life-threatening deterioration.  Critical care was time spent personally by me on the following activities: development of treatment plan with patient and/or surrogate as well as nursing, discussions with consultants, evaluation of patient's response to treatment, examination of patient, obtaining history from patient or surrogate, ordering and performing treatments and interventions, ordering and review of laboratory studies, ordering and review of radiographic studies, pulse oximetry and re-evaluation of patient's condition.  Johnsie Cancel, NP-C [facility] After hours pager: [phone]. 05/16/2019, 5:08 PM

## 2019-05-16 NOTE — Anesthesia Procedure Notes (Signed)
Procedure Name: Intubation Date/Time: 05/16/2019 11:46 AM Performed by: Shirlyn Goltz, CRNA Pre-anesthesia Checklist: Patient identified, Emergency Drugs available, Suction available and Patient being monitored Patient Re-evaluated:Patient Re-evaluated prior to induction Oxygen Delivery Method: Circle system utilized Preoxygenation: Pre-oxygenation with 100% oxygen Induction Type: IV induction Ventilation: Mask ventilation without difficulty Laryngoscope Size: Mac and 3 Grade View: Grade I Tube type: Oral Tube size: 7.0 mm Number of attempts: 1 Airway Equipment and Method: Stylet Placement Confirmation: ETT inserted through vocal cords under direct vision,  positive ETCO2 and breath sounds checked- equal and bilateral Secured at: 23 cm Tube secured with: Tape Dental Injury: Teeth and Oropharynx as per pre-operative assessment

## 2019-05-16 NOTE — Research (Signed)
Alleviate Informed Consent   Subject Name: [name]  Subject met inclusion and exclusion criteria.  The informed consent form, study requirements and expectations were reviewed with the subject and questions and concerns were addressed prior to the signing of the consent form.  The subject verbalized understanding of the trail requirements.  The subject agreed to participate in the Alleviate trial and signed the informed consent.  The informed consent was obtained prior to performance of any protocol-specific procedures for the subject.  A copy of the signed informed consent was given to the subject and a copy was placed in the subject's medical record.  [name], [name] 05/16/2019, 6756 a.m

## 2019-05-16 NOTE — Consult Note (Signed)
Advanced Heart Failure Team ConsultlNote   Referring [facility]ologist:  Croitruro   Reason for Consult: Cardiogenic shock    HPI:    68 y/o PAF/AFL, HTN, OSA, CKD 3b and systolic HF felt due to tachy-induced CM  Admitted today for PVI.   Pre-op TEE LVEF 10% RV HK  Had successful PVI. Post PVI developed cardiogenic shock. Treated initially with vasopressin and neo. Then bolused with epi.   We switched to dobutamine and NE with good effect.   Swan placed in cath lab with elevated filling pressures and normal output on pressors    Review of Systems: unavailable due to intubation    Home Medications Prior to Admission medications   Medication Sig Start Date End Date Taking? Authorizing Provider  allopurinol (ZYLOPRIM) 300 MG tablet Take 1 tablet by mouth once daily 04/02/19  Yes [name], MD  benazepril (LOTENSIN) 5 MG tablet Take 1/2 (one-half) tablet by mouth once daily Patient taking differently: Take 2.5 mg by mouth daily.  02/26/19  Yes [name], MD  calcium carbonate (TUMS - DOSED IN MG ELEMENTAL CALCIUM) 500 MG chewable tablet Chew 1 tablet by mouth 3 (three) times daily as needed (heartburn/indigestion.).    Yes [provider]  cetirizine (ZYRTEC) 10 MG tablet Take 1 tablet (10 mg total) by mouth daily. 11/01/14  Yes [name], MD  Clobetasol Prop Emollient Base (CLOBETASOL PROPIONATE E) 0.05 % emollient cream Apply 1 application topically 2 (two) times daily as needed (insect bite). 12/16/16  Yes [name], MD  dofetilide (TIKOSYN) 500 MCG capsule TAKE 1 CAPSULE(500 MCG) BY MOUTH TWICE DAILY Patient taking differently: Take 500 mcg by mouth 2 (two) times daily.  05/01/19  Yes Croitoru, Mihai, MD  ELIQUIS 5 MG TABS tablet Take 1 tablet by mouth twice daily 02/26/19  Yes Croitoru, Mihai, MD  fish oil-omega-3 fatty acids 1000 MG capsule Take 1 g by mouth daily.    Yes [provider]  furosemide (LASIX) 40 MG tablet  TAKE TWO TABLETS BY MOUTH EVERY MORNING AND TAKE ONE TABLET BY MOUTH EVERY AFTERNOON Patient taking differently: Take 40-80 mg by mouth See admin instructions. TAKE TWO TABLETS BY MOUTH EVERY MORNING AND TAKE ONE TABLET BY MOUTH EVERY AFTERNOON 03/15/19  Yes Lyda Jester M, PA-C  glucose blood test strip (Verio) Use as instructed Check blood sugars TID DxE11.9 01/24/19  Yes [name], MD  glyBURIDE (DIABETA) 5 MG tablet Take 1 tablet by mouth once daily with breakfast Patient taking differently: Take 5 mg by mouth daily with breakfast.  04/02/19  Yes [name], MD  LORazepam (ATIVAN) 1 MG tablet Take 1 tablet (1 mg total) by mouth every 8 (eight) hours as needed for anxiety. 02/05/19  Yes [name], MD  magnesium oxide (MAG-OX) 400 MG tablet Take 1 tablet (400 mg total) by mouth daily. 04/18/15  Yes Barrett, Evelene Croon, PA-C  metFORMIN (GLUCOPHAGE) 500 MG tablet TAKE 1 TABLET BY MOUTH THREE TIMES DAILY 04/02/19  Yes [name], MD  metoprolol succinate (TOPROL-XL) 100 MG 24 hr tablet Take 1 tablet (100 mg total) by mouth 2 (two) times daily. 03/27/19  Yes Croitoru, Mihai, MD  metoprolol succinate (TOPROL-XL) 50 MG 24 hr tablet Take 3 tablets (150 mg total) by mouth 2 (two) times daily. Take with or immediately following a meal. Patient taking differently: Take 50 mg by mouth 2 (two) times daily. Take with or immediately following a meal.  03/27/19  Yes Croitoru, Mihai, MD  Multiple Vitamin (MULTIVITAMIN WITH MINERALS) TABS tablet Take 1 tablet by mouth daily.    Yes [provider]  potassium chloride SA (K-DUR,KLOR-CON) 20 MEQ tablet TAKE 1 TABLET BY MOUTH ONCE DAILY AND  EXTRA  TABLET  IF  WEIGHT  IS  ABOVE  210  LBS Patient taking differently: Take 20 mEq by mouth daily.  06/23/18  Yes Croitoru, Mihai, MD  pravastatin (PRAVACHOL) 40 MG tablet Take 1 tablet by mouth once daily 02/26/19  Yes Hilty, Nadean Corwin, MD  temazepam (RESTORIL) 30 MG capsule Take 1 capsule (30 mg  total) by mouth at bedtime. 02/05/19  Yes [name], MD  albuterol (VENTOLIN HFA) 108 (90 Base) MCG/ACT inhaler Inhale 2 puffs into the lungs every 6 (six) hours as needed for wheezing or shortness of breath. 12/16/16   [name], MD  Blood Glucose Monitoring Suppl (ONETOUCH VERIO) w/Device KIT Use to check blood sugar TID as directed.  Dx Code: E11.9 02/22/19   [name], MD  colchicine (COLCRYS) 0.6 MG tablet TAKE ONE TABLET BY MOUTH DAILY AS NEEDED FOR GOUT FLARE UPS. 01/14/16   Croitoru, Dani Gobble, MD  HYDROcodone-acetaminophen (NORCO) 10-325 MG tablet Take 1 tablet by mouth every 8 (eight) hours as needed for moderate pain or severe pain. 08/03/18   [name], MD  meloxicam (MOBIC) 7.5 MG tablet Take 7.5 mg by mouth 2 (two) times daily as needed. 04/13/19   [provider]  naproxen (NAPROSYN) 375 MG tablet Take 375 mg by mouth daily as needed (pain).     [provider]    Past Medical History: Past Medical History:  Diagnosis Date  . AORTIC STENOSIS, MODERATE 12/05/2009  . Atrial fibrillation ([place])   . Atrial fibrillation with RVR ([place]) 07/05/2017  . Atrial flutter ([place]) 12/05/2009  . Atypical chest pain 11/08/2011  . BENIGN PROSTATIC HYPERTROPHY, HX OF 12/05/2009  . CHEST PAIN-UNSPECIFIED 11/11/2009  . CHF 12/05/2009  . Chronic renal insufficiency 12/16/2016  . Debility 04/18/2012  . Diarrhea 12/16/2016  . DYSPNEA 12/19/2009  . Edema 04/18/2012  . ERECTILE DYSFUNCTION 01/25/2007  . FATIGUE, CHRONIC 11/11/2009  . Gout 07/10/2012  . Heart murmur   . History of kidney stones    "they passed" (07/05/2017)  . Hyperkalemia 04/18/2012  . Hyperlipidemia   . Hypertension   . HYPERTENSION 01/25/2007  . Hypoxia 05/05/2011  . Insomnia 05/13/2016  . INSOMNIA, HX OF 01/25/2007  . Knee pain, bilateral 12/28/2010  . Knee pain, right 12/28/2010  . NEUROMA 01/05/2010  . OBESITY NOS 01/25/2007  . OSA on CPAP 04/06/2010  . PERIPHERAL NEUROPATHY 01/05/2010  . PULMONARY FUNCTION TESTS,  ABNORMAL 02/02/2010  . Superficial thrombophlebitis of left leg 09/07/2011  . TESTICULAR HYPOFUNCTION 01/05/2010  . 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 Toe pain, right 07/10/2012  . Type II diabetes mellitus ([place]) 12/05/2009  . UNSPECIFIED ANEMIA 01/05/2010    Past Surgical History: Past Surgical History:  Procedure Laterality Date  . CARDIAC CATHETERIZATION  10/16/2009   nonischemic cardiomyopathy  . CARDIOVERSION N/A 03/25/2015   Procedure: CARDIOVERSION;  Surgeon: Pixie Casino, MD;  Location: [facility] ENDOSCOPY;  Service: Cardiovascular;  Laterality: N/A;  . CARDIOVERSION N/A 06/17/2017   Procedure: CARDIOVERSION;  Surgeon: Sanda Klein, MD;  Location: [facility] ENDOSCOPY;  Service: Cardiovascular;  Laterality: N/A;  . CARDIOVERSION N/A 07/08/2017   Procedure: CARDIOVERSION;  Surgeon: Fay Records, MD;  Location: [facility];  Service: Cardiovascular;  Laterality: N/A;  .  CARDIOVERSION N/A 02/27/2019   Procedure: CARDIOVERSION;  Surgeon: Sanda Klein, MD;  Location: [facility] ENDOSCOPY;  Service: Cardiovascular;  Laterality: N/A;  . METATARSAL OSTEOTOMY Bilateral ~ 1980   removed part of 5th metatarsal to corect curvature of toes     Family History: Family History  Problem Relation Age of Onset  . Clotting disorder Mother   . Heart disease Mother        s/p MI  . Heart attack Mother   . Hypertension Mother   . Diabetes Mother   . Hyperlipidemia Mother   . Stroke Mother   . Cancer Father        ? lung  . Lung disease Father        smoker  . Diabetes Sister   . Hypertension Sister        smoker  . Leukemia Maternal Grandmother        ?  [name] Aneurysm Sister        brain  . Other Sister        clipped  . Seizures Sister        d/o w/aneurysm/ smoker    Social History: Social History   Socioeconomic History  . Marital status: Married    Spouse name: Not on file  . Number of children: 4  . Years of education: 71  . Highest education level: High school graduate  Occupational History  .  Occupation: retired    Fish farm manager: [employer]  Social Needs  . Financial resource strain: Not on file  . Food insecurity    Worry: Not on file    Inability: Not on file  . Transportation needs    Medical: Not on file    Non-medical: Not on file  Tobacco Use  . Smoking status: Former Smoker    Packs/day: 1.00    Years: 10.00    Pack years: 10.00    Quit date: 06/07/1980    Years since quitting: 38.9  . Smokeless tobacco: Never Used  Substance and Sexual Activity  . Alcohol use: No  . Drug use: No  . Sexual activity: Not Currently  Lifestyle  . Physical activity    Days per week: Not on file    Minutes per session: Not on file  . Stress: Not on file  Relationships  . Social Herbalist on phone: Not on file    Gets together: Not on file    Attends religious service: Not on file    Active member of club or organization: Not on file    Attends meetings of clubs or organizations: Not on file    Relationship status: Not on file  Other Topics Concern  . Not on file  Social History Narrative   Lives with male partner in a one story home.  His son lives there off and on.  Retired from [employer].  Education: high school.      Allergies:  Allergies  Allergen Reactions  . Sulfonamide Derivatives Other (See Comments)    unknown reaction.  Patient states had to go to the ER.   [name] Zolpidem Other (See Comments)    Excessive, prolonged sedation    Objective:    Vital Signs:   Temp:  [97.5 F (36.4 C)-97.6 F (36.4 C)] 97.5 F (36.4 C) (12/09 1654) Pulse Rate:  [0-134] 96 (12/09 1654) Resp:  [0-18] 18 (12/09 1654) BP: (117-121)/(71-83) 117/71 (12/09 1654) SpO2:  [0 %-100 %] 0 % (12/09 1623) FiO2 (%):  [  70 %] 70 % (12/09 1656) Weight:  [86.1 kg] 86.1 kg (12/09 0838)   Filed Weights   05/16/19 0838  Weight: 86.1 kg    Physical Exam: General:  Intubated sedated HEENT: normal + ETT Neck: supple. RIJ swan  Carotids 2+ bilat; no bruits. No lymphadenopathy or  thryomegaly appreciated. Cor: PMI nondisplaced. Regular rate & rhythm. +s3 Lungs: coarse Abdomen: soft, nontender, nondistended. No hepatosplenomegaly. No bruits or masses. Good bowel sounds. Extremities: no cyanosis, clubbing, rash, edema Neuro: intubated/sedated  Telemetry: NSR 90s Personally reviewed   Labs: Basic Metabolic Panel: Recent Labs  Lab 05/16/19 1330 05/16/19 1512 05/16/19 1532 05/16/19 1609  NA 145 146* 145 150*  148*  K 4.5 4.0 4.4 4.1  4.1    Liver Function Tests: No results for input(s): AST, ALT, ALKPHOS, BILITOT, PROT, ALBUMIN in the last 168 hours. No results for input(s): LIPASE, AMYLASE in the last 168 hours. No results for input(s): AMMONIA in the last 168 hours.  CBC: Recent Labs  Lab 05/16/19 1330 05/16/19 1512 05/16/19 1532 05/16/19 1609  HGB 12.6* 11.9* 11.9* 11.6*  11.2*  HCT 37.0* 35.0* 35.0* 34.0*  33.0*    Cardiac Enzymes: No results for input(s): CKTOTAL, CKMB, CKMBINDEX, TROPONINI in the last 168 hours.  BNP: BNP (last 3 results) Recent Labs    05/16/19 1200  BNP 995.4*    ProBNP (last 3 results) No results for input(s): PROBNP in the last 8760 hours.   CBG: Recent Labs  Lab 05/16/19 0847  GLUCAP 142*    Coagulation Studies: No results for input(s): LABPROT, INR in the last 72 hours.  Other results:   Imaging:  No results found.     Assessment/Plan:    1. Cardiogenic shock 2. Acute on chronic systolic HF with biventricualr failure due to tachy CM - EF 10% by TEE 12/9 3. Recurrent AF - s/p successful PVI 12/9 4. Acute hypoxic respiratory failure. Due to #1&2 5. CKD 3b  Admit to ICU follow hemodynamics. Wean NE as tolerated. Continue dobutamine. Diurese. Hopefully can extubate in am. Continue Eliquis.  Hopefully EF will improve with NSR.   CRITICAL CARE Performed by: [name]  Total critical care time: 60 minutes  Critical care time was exclusive of separately billable procedures  and treating other patients.  Critical care was necessary to treat or prevent imminent or life-threatening deterioration.  Critical care was time spent personally by me (independent of midlevel providers or residents) on the following activities: development of treatment plan with patient and/or surrogate as well as nursing, discussions with consultants, evaluation of patient's response to treatment, examination of patient, obtaining history from patient or surrogate, ordering and performing treatments and interventions, ordering and review of laboratory studies, ordering and review of radiographic studies, pulse oximetry and re-evaluation of patient's condition.   Length of Stay: 0   [name] MD 05/16/2019, 7:16 PM  Advanced Heart Failure Team Pager [phone] (M-F; 7a - 4p)  Please contact [facility] for night-coverage after hours (4p -7a ) and weekends on [email or web address]

## 2019-05-16 NOTE — Anesthesia Postprocedure Evaluation (Signed)
Anesthesia Post Note  Patient: [name]  Procedure(s) Performed: ATRIAL FIBRILLATION ABLATION (N/A ) A-FLUTTER ABLATION (N/A ) Transesophageal Echocardiogram (Tee) RIGHT HEART CATH (N/A )     Patient location during evaluation: ICU Anesthesia Type: General Level of consciousness: sedated Pain management: pain level controlled Vital Signs Assessment: post-procedure vital signs reviewed and stable Respiratory status: patient remains intubated per anesthesia plan Cardiovascular status: stable Postop Assessment: no apparent nausea or vomiting Anesthetic complications: no    Last Vitals:  Vitals:   05/16/19 1623 05/16/19 1654  BP:  117/71  Pulse: (!) 134 96  Resp: (!) 0 18  Temp:  (!) 36.4 C  SpO2: (!) 0%     Last Pain:  Vitals:   05/16/19 1654  TempSrc: Temporal  PainSc: 0-No pain                 [name] P Sole Lengacher

## 2019-05-16 NOTE — Transfer of Care (Signed)
Immediate Anesthesia Transfer of Care Note  Patient: [name]  Procedure(s) Performed: ATRIAL FIBRILLATION ABLATION (N/A ) A-FLUTTER ABLATION (N/A ) Transesophageal Echocardiogram (Tee) RIGHT HEART CATH (N/A )  Patient Location: Cath Lab  Anesthesia Type:General  Level of Consciousness: Patient remains intubated per anesthesia plan  Airway & Oxygen Therapy: Patient remains intubated per anesthesia plan and Patient placed on Ventilator (see vital sign flow sheet for setting)  Post-op Assessment: Report given to RN and Post -op Vital signs reviewed and stable  Post vital signs: Reviewed and stable  Last Vitals:  Vitals Value Taken Time  BP 93/59 05/16/19 1706  Temp 36.4 C 05/16/19 1654  Pulse 96 05/16/19 1654  Resp 16 05/16/19 1709  SpO2    Vitals shown include unvalidated device data.  Last Pain:  Vitals:   05/16/19 1654  TempSrc: Temporal  PainSc: 0-No pain         Complications: No apparent anesthesia complications

## 2019-05-17 DIAGNOSIS — I130 Hypertensive heart and chronic kidney disease with heart failure and stage 1 through stage 4 chronic kidney disease, or unspecified chronic kidney disease: Secondary | ICD-10-CM | POA: Diagnosis present

## 2019-05-17 DIAGNOSIS — R338 Other retention of urine: Secondary | ICD-10-CM | POA: Diagnosis present

## 2019-05-17 DIAGNOSIS — R570 Cardiogenic shock: Secondary | ICD-10-CM | POA: Diagnosis not present

## 2019-05-17 DIAGNOSIS — N1832 Chronic kidney disease, stage 3b: Secondary | ICD-10-CM | POA: Diagnosis not present

## 2019-05-17 DIAGNOSIS — Z20828 Contact with and (suspected) exposure to other viral communicable diseases: Secondary | ICD-10-CM | POA: Diagnosis present

## 2019-05-17 DIAGNOSIS — J95821 Acute postprocedural respiratory failure: Secondary | ICD-10-CM | POA: Diagnosis not present

## 2019-05-17 DIAGNOSIS — G4733 Obstructive sleep apnea (adult) (pediatric): Secondary | ICD-10-CM | POA: Diagnosis present

## 2019-05-17 DIAGNOSIS — Z006 Encounter for examination for normal comparison and control in clinical research program: Secondary | ICD-10-CM | POA: Diagnosis not present

## 2019-05-17 DIAGNOSIS — I5043 Acute on chronic combined systolic (congestive) and diastolic (congestive) heart failure: Secondary | ICD-10-CM | POA: Diagnosis not present

## 2019-05-17 DIAGNOSIS — J9690 Respiratory failure, unspecified, unspecified whether with hypoxia or hypercapnia: Secondary | ICD-10-CM | POA: Diagnosis not present

## 2019-05-17 DIAGNOSIS — N3289 Other specified disorders of bladder: Secondary | ICD-10-CM | POA: Diagnosis not present

## 2019-05-17 DIAGNOSIS — Z8249 Family history of ischemic heart disease and other diseases of the circulatory system: Secondary | ICD-10-CM | POA: Diagnosis not present

## 2019-05-17 DIAGNOSIS — E1122 Type 2 diabetes mellitus with diabetic chronic kidney disease: Secondary | ICD-10-CM | POA: Diagnosis present

## 2019-05-17 DIAGNOSIS — I4819 Other persistent atrial fibrillation: Secondary | ICD-10-CM | POA: Diagnosis present

## 2019-05-17 DIAGNOSIS — I429 Cardiomyopathy, unspecified: Secondary | ICD-10-CM | POA: Diagnosis present

## 2019-05-17 DIAGNOSIS — I5082 Biventricular heart failure: Secondary | ICD-10-CM | POA: Diagnosis not present

## 2019-05-17 DIAGNOSIS — E785 Hyperlipidemia, unspecified: Secondary | ICD-10-CM | POA: Diagnosis present

## 2019-05-17 DIAGNOSIS — Z7984 Long term (current) use of oral hypoglycemic drugs: Secondary | ICD-10-CM | POA: Diagnosis not present

## 2019-05-17 DIAGNOSIS — Z87891 Personal history of nicotine dependence: Secondary | ICD-10-CM | POA: Diagnosis not present

## 2019-05-17 DIAGNOSIS — N179 Acute kidney failure, unspecified: Secondary | ICD-10-CM | POA: Diagnosis not present

## 2019-05-17 DIAGNOSIS — Z79899 Other long term (current) drug therapy: Secondary | ICD-10-CM | POA: Diagnosis not present

## 2019-05-17 DIAGNOSIS — I483 Typical atrial flutter: Secondary | ICD-10-CM | POA: Diagnosis present

## 2019-05-17 DIAGNOSIS — I351 Nonrheumatic aortic (valve) insufficiency: Secondary | ICD-10-CM | POA: Diagnosis present

## 2019-05-17 DIAGNOSIS — I471 Supraventricular tachycardia: Secondary | ICD-10-CM | POA: Diagnosis not present

## 2019-05-17 DIAGNOSIS — I5023 Acute on chronic systolic (congestive) heart failure: Secondary | ICD-10-CM | POA: Diagnosis not present

## 2019-05-17 DIAGNOSIS — D649 Anemia, unspecified: Secondary | ICD-10-CM | POA: Diagnosis present

## 2019-05-17 LAB — GLUCOSE, CAPILLARY
Glucose-Capillary: 117 mg/dL — ABNORMAL HIGH (ref 70–99)
Glucose-Capillary: 91 mg/dL (ref 70–99)
Glucose-Capillary: 57 mg/dL — ABNORMAL LOW (ref 70–99)
Glucose-Capillary: 91 mg/dL (ref 70–99)
Glucose-Capillary: 39 mg/dL — CL (ref 70–99)
Glucose-Capillary: 40 mg/dL — CL (ref 70–99)
Glucose-Capillary: 70 mg/dL (ref 70–99)

## 2019-05-17 LAB — COOXEMETRY PANEL
Carboxyhemoglobin: 1.3 % (ref 0.5–1.5)
Methemoglobin: 0.8 % (ref 0.0–1.5)
O2 Saturation: 70.4 %
Total hemoglobin: 10.6 g/dL — ABNORMAL LOW (ref 12.0–16.0)

## 2019-05-17 LAB — COMPREHENSIVE METABOLIC PANEL
ALT: 24 U/L (ref 0–44)
AST: 39 U/L (ref 15–41)
Albumin: 3.5 g/dL (ref 3.5–5.0)
Alkaline Phosphatase: 32 U/L — ABNORMAL LOW (ref 38–126)
Anion gap: 14 (ref 5–15)
BUN: 57 mg/dL — ABNORMAL HIGH (ref 8–23)
CO2: 22 mmol/L (ref 22–32)
Calcium: 8.7 mg/dL — ABNORMAL LOW (ref 8.9–10.3)
Chloride: 110 mmol/L (ref 98–111)
Creatinine, Ser: 2.45 mg/dL — ABNORMAL HIGH (ref 0.61–1.24)
GFR calc Af Amer: 31 mL/min — ABNORMAL LOW (ref >60)
GFR calc non Af Amer: 26 mL/min — ABNORMAL LOW (ref >60)
Glucose, Bld: 123 mg/dL — ABNORMAL HIGH (ref 70–99)
Potassium: 3.8 mmol/L (ref 3.5–5.1)
Sodium: 146 mmol/L — ABNORMAL HIGH (ref 135–145)
Total Bilirubin: 2.4 mg/dL — ABNORMAL HIGH (ref 0.3–1.2)
Total Protein: 6 g/dL — ABNORMAL LOW (ref 6.5–8.1)

## 2019-05-17 LAB — CBC
HCT: 35.8 % — ABNORMAL LOW (ref 39.0–52.0)
Hemoglobin: 11 g/dL — ABNORMAL LOW (ref 13.0–17.0)
MCH: 26.3 pg (ref 26.0–34.0)
MCHC: 30.7 g/dL (ref 30.0–36.0)
MCV: 85.6 fL (ref 80.0–100.0)
Platelets: 162 10*3/uL (ref 150–400)
RBC: 4.18 MIL/uL — ABNORMAL LOW (ref 4.22–5.81)
RDW: 17.1 % — ABNORMAL HIGH (ref 11.5–15.5)
WBC: 7.3 10*3/uL (ref 4.0–10.5)
nRBC: 0.5 % — ABNORMAL HIGH (ref 0.0–0.2)

## 2019-05-17 LAB — MRSA PCR SCREENING: MRSA by PCR: NEGATIVE

## 2019-05-17 LAB — MAGNESIUM: Magnesium: 2.1 mg/dL (ref 1.7–2.4)

## 2019-05-17 MED ORDER — DEXTROSE 50 % IV SOLN
INTRAVENOUS | Status: AC
  Administered 2019-05-17: 12.5 g via INTRAVENOUS
  Filled 2019-05-17: qty 50

## 2019-05-17 MED ORDER — DOBUTAMINE IN D5W 4-5 MG/ML-% IV SOLN
1.5000 ug/kg/min | INTRAVENOUS | Status: DC
  Administered 2019-05-18: 3 ug/kg/min via INTRAVENOUS
  Filled 2019-05-17: qty 250

## 2019-05-17 MED ORDER — DEXTROSE 50 % IV SOLN
INTRAVENOUS | Status: AC
  Administered 2019-05-17: 50 mL
  Filled 2019-05-17: qty 50

## 2019-05-17 MED ORDER — INSULIN ASPART 100 UNIT/ML ~~LOC~~ SOLN
0.0000 [IU] | Freq: Three times a day (TID) | SUBCUTANEOUS | Status: DC
  Administered 2019-05-19: 1 [IU] via SUBCUTANEOUS
  Administered 2019-05-19 – 2019-05-20 (×3): 2 [IU] via SUBCUTANEOUS
  Administered 2019-05-20: 1 [IU] via SUBCUTANEOUS
  Administered 2019-05-21 (×2): 2 [IU] via SUBCUTANEOUS
  Administered 2019-05-21: 1 [IU] via SUBCUTANEOUS
  Administered 2019-05-22: 3 [IU] via SUBCUTANEOUS
  Administered 2019-05-23: 1 [IU] via SUBCUTANEOUS
  Administered 2019-05-23: 2 [IU] via SUBCUTANEOUS
  Administered 2019-05-23 – 2019-05-24 (×3): 1 [IU] via SUBCUTANEOUS
  Administered 2019-05-24: 2 [IU] via SUBCUTANEOUS
  Administered 2019-05-25: 3 [IU] via SUBCUTANEOUS
  Administered 2019-05-25: 12:00:00 1 [IU] via SUBCUTANEOUS
  Administered 2019-05-25: 2 [IU] via SUBCUTANEOUS
  Administered 2019-05-26: 1 [IU] via SUBCUTANEOUS
  Administered 2019-05-26 – 2019-05-27 (×2): 3 [IU] via SUBCUTANEOUS
  Administered 2019-05-27: 1 [IU] via SUBCUTANEOUS
  Administered 2019-05-27 – 2019-05-28 (×2): 2 [IU] via SUBCUTANEOUS
  Administered 2019-05-28: 1 [IU] via SUBCUTANEOUS
  Administered 2019-05-28: 3 [IU] via SUBCUTANEOUS
  Administered 2019-05-29: 1 [IU] via SUBCUTANEOUS
  Administered 2019-05-29: 2 [IU] via SUBCUTANEOUS

## 2019-05-17 MED ORDER — ORAL CARE MOUTH RINSE
15.0000 mL | Freq: Two times a day (BID) | OROMUCOSAL | Status: DC
  Administered 2019-05-17 – 2019-05-29 (×19): 15 mL via OROMUCOSAL

## 2019-05-17 MED ORDER — PANTOPRAZOLE SODIUM 40 MG PO PACK
40.0000 mg | PACK | Freq: Every day | ORAL | Status: DC
  Administered 2019-05-17: 40 mg
  Filled 2019-05-17: qty 20

## 2019-05-17 MED ORDER — ORAL CARE MOUTH RINSE
15.0000 mL | Freq: Two times a day (BID) | OROMUCOSAL | Status: DC
  Administered 2019-05-19 – 2019-05-22 (×5): 15 mL via OROMUCOSAL

## 2019-05-17 MED ORDER — DEXTROSE 50 % IV SOLN: 12.5000 g | INTRAVENOUS | Status: AC

## 2019-05-17 MED ORDER — DEXTROSE 50 % IV SOLN: 25.0000 g | INTRAVENOUS | Status: AC

## 2019-05-17 MED ORDER — CHLORHEXIDINE GLUCONATE 0.12% ORAL RINSE (MEDLINE KIT)
15.0000 mL | Freq: Two times a day (BID) | OROMUCOSAL | Status: DC
  Administered 2019-05-17 (×2): 15 mL via OROMUCOSAL

## 2019-05-17 MED ORDER — ORAL CARE MOUTH RINSE
15.0000 mL | OROMUCOSAL | Status: DC
  Administered 2019-05-17 (×6): 15 mL via OROMUCOSAL

## 2019-05-17 MED ORDER — EPINEPHRINE 1 MG/10ML IJ SOSY
PREFILLED_SYRINGE | INTRAMUSCULAR | Status: AC
  Filled 2019-05-17: qty 10

## 2019-05-17 MED ORDER — INSULIN ASPART 100 UNIT/ML ~~LOC~~ SOLN: 0.0000 [IU] | Freq: Every day | SUBCUTANEOUS | Status: DC

## 2019-05-17 MED ORDER — TAMSULOSIN HCL 0.4 MG PO CAPS
0.4000 mg | ORAL_CAPSULE | Freq: Every day | ORAL | Status: DC
  Administered 2019-05-18 – 2019-05-29 (×12): 0.4 mg via ORAL
  Filled 2019-05-17 (×12): qty 1

## 2019-05-17 MED FILL — Fentanyl Citrate Preservative Free (PF) Inj 100 MCG/2ML: INTRAMUSCULAR | Qty: 50 | Status: AC

## 2019-05-17 NOTE — Addendum Note (Signed)
Addendum  created 05/17/19 1237 by Shirlyn Goltz, CRNA   Intraprocedure Event edited

## 2019-05-17 NOTE — Progress Notes (Signed)
Hypoglycemic Event  CBG: 57  Treatment: 12.5g Dextrose  Symptoms: Asymptomatic  Follow-up CBG: Time: [id number] CBG Result: 91  Possible Reasons for Event: NPO     [employer]

## 2019-05-17 NOTE — Progress Notes (Addendum)
Advanced Heart Failure Rounding Note  PCP-Cardiologist: Kevin Klein, MD   Subjective:    67 y/o PAF/AFL, HTN, OSA, CKD 3b and systolic HF felt due to tachy-induced CM  Admitted 12/9  for PVI. Pre-op TEE LVEF 10% RV HK.  Had successful PVI. Post PVI developed cardiogenic shock. Treated initially with vasopressin and neo. Then bolused with epi.  We switched to dobutamine and NE with good effect. Swan placed in cath lab with elevated filling pressures and normal output on pressors .  NE discontinued. Remains on Dobutamine 5 mcg/kg/min. Co-ox 70%.   Swan CI 2.89. CO 5.95 PAP 62/26 (43) MAP 81 CVP 10  Remains intubated but alert and follows commands (squeezes hand)   Objective:   Weight Range: 86.1 kg Body mass index is 26.49 kg/m.   Vital Signs:   Temp:  [95.9 F (35.5 C)-98.6 F (37 C)] 98.6 F (37 C) (12/10 0800) Pulse Rate:  [0-134] 88 (12/10 0800) Resp:  [0-20] 16 (12/10 0800) BP: (99-127)/(60-86) 116/71 (12/10 0800) SpO2:  [0 %-100 %] 97 % (12/10 0800) Arterial Line BP: (71-147)/(55-73) 136/61 (12/10 0800) FiO2 (%):  [40 %-70 %] 40 % (12/10 0400) Weight:  [86.1 kg] 86.1 kg (12/09 0838)    Weight change: Filed Weights   05/16/19 0838  Weight: 86.1 kg    Intake/Output:   Intake/Output Summary (Last 24 hours) at 05/17/2019 0831 Last data filed at 05/17/2019 0800 Gross per 24 hour  Intake 2247.26 ml  Output 1250 ml  Net 997.26 ml      Physical Exam    General:  Critically ill AAM, intubated but alert. No resp difficulty HEENT: Normal Neck: Supple. JVP . Carotids 2+ bilat; no bruits. No lymphadenopathy or thyromegaly appreciated. Cor: PMI nondisplaced. Regular rate & rhythm. No rubs, gallops or murmurs. Lungs: intubated, course BS bilaterally  Abdomen: Soft, nontender, nondistended. No hepatosplenomegaly. No bruits or masses. Good bowel sounds.  Extremities: No cyanosis, clubbing, rash, edema Neuro: Alert & orientedx3, cranial nerves grossly  intact. moves all 4 extremities w/o difficulty. Affect pleasant   Telemetry  NSR 97 bpm   EKG    NSR w/ 1st deg avb. PVCs  Labs    CBC Recent Labs    05/16/19 1609 05/17/19 0535  WBC  --  7.3  HGB 11.6*  11.2* 11.0*  HCT 34.0*  33.0* 35.8*  MCV  --  85.6  PLT  --  132   Basic Metabolic Panel Recent Labs    05/16/19 1609 05/17/19 0535  NA 150*  148* 146*  K 4.1  4.1 3.8  CL  --  110  CO2  --  22  GLUCOSE  --  123*  BUN  --  57*  CREATININE  --  2.45*  CALCIUM  --  8.7*  MG  --  2.1   Liver Function Tests Recent Labs    05/17/19 0535  AST 39  ALT 24  ALKPHOS 32*  BILITOT 2.4*  PROT 6.0*  ALBUMIN 3.5   No results for input(s): LIPASE, AMYLASE in the last 72 hours. Cardiac Enzymes No results for input(s): CKTOTAL, CKMB, CKMBINDEX, TROPONINI in the last 72 hours.  BNP: BNP (last 3 results) Recent Labs    05/16/19 1200  BNP 995.4*    ProBNP (last 3 results) No results for input(s): PROBNP in the last 8760 hours.   D-Dimer No results for input(s): DDIMER in the last 72 hours. Hemoglobin A1C No results for input(s): HGBA1C in the last  72 hours. Fasting Lipid Panel No results for input(s): CHOL, HDL, LDLCALC, TRIG, CHOLHDL, LDLDIRECT in the last 72 hours. Thyroid Function Tests No results for input(s): TSH, T4TOTAL, T3FREE, THYROIDAB in the last 72 hours.  Invalid input(s): FREET3  Other results:   Imaging    CARDIAC CATHETERIZATION  Result Date: 05/16/2019 Findings: On dobutamine 5 and NE 10 RA = 15 RV =  47/17 PA =  49/32 (41) PCW = 27 Fick cardiac output/index = 5.8/2.8 PVR = 2.4 WU FA sat = 94% PA sat = 67%, 68% Assessment: 1. Marked volume overload 2. Resuscitated cardiogenic shock with inotrope support Plan/Discussion: Move to ICU. Diurese. Wean inotropes as tolerated. Kevin Bickers, MD 4:30 PM  EP STUDY  Result Date: 05/16/2019 SURGEON:  Kevin Lai, MD PREPROCEDURE DIAGNOSES: 1. Persistent atrial fibrillation.  POSTPROCEDURE DIAGNOSES: 1. Persistent atrial fibrillation. PROCEDURES: 1. Comprehensive electrophysiologic study. 2. Coronary sinus pacing and recording. 3. Three-dimensional mapping of atrial fibrillation (with additional mapping and ablation within the left atrium due to persistence of afib) 4. Ablation of atrial fibrillation (with additional mapping and ablation within the left atrium due to persistence of afib) 5. Ablation of cavo-tricuspid isthmus for typical atrial flutter 6. Intracardiac echocardiography. 7. Transseptal puncture of an intact septum. 8. Arrhythmia induction with pacing 9. External cardioversion. INTRODUCTION:  [name] is a 68 y.o. male with a history of persistent atrial fibrillation who now presents for EP study and radiofrequency ablation.  The patient reports initially being diagnosed with atrial fibrillation after presenting with symptomatic palpitations and fatgiue.  The patient has failed medical therapy.  The patient therefore presents today for catheter ablation of atrial fibrillation. DESCRIPTION OF PROCEDURE:  Informed written consent was obtained, and the patient was brought to the electrophysiology lab in a fasting state.  The patient was adequately sedated with intravenous medications as outlined in the anesthesia report.  The patient's left and right groins were prepped and draped in the usual sterile fashion by the EP lab staff.  Using a percutaneous Seldinger technique, two 7-French and one 11-French hemostasis sheaths were placed into the right common femoral vein.  Catheter Placement:  A 7-French Biosense Webster Decapolar coronary sinus catheter was introduced through the right common femoral vein and advanced into the coronary sinus for recording and pacing from this location.    Initial Measurements: The patient presented to the electrophysiology lab in atrial fibrillation.   The average RR interval measured 429 msec.   Intracardiac Echocardiography: A 10-French  Biosense Webster AcuNav intracardiac echocardiography catheter was introduced through the right common femoral vein and advanced into the right atrium. Intracardiac echocardiography was performed of the left atrium, and a three-dimensional anatomical rendering of the left atrium was performed using CARTO sound technology.  The patient was noted to have a moderate sized left atrium.  The interatrial septum was prominent but not aneurysmal. All 4 pulmonary veins were visualized and noted to have separate ostia.  The pulmonary veins were moderate in size.  The left atrial appendage was visualized and did not reveal thrombus.   There was no evidence of pulmonary vein stenosis. Transseptal Puncture: The right common femoral vein sheaths were was exchanged for an 8.5 [place] Agillis transseptal sheath and transseptal access was achieved in a standard fashion using a Brockenbrough needle under biplane fluoroscopy with intracardiac echocardiography confirmation of the transseptal puncture.  Once transseptal access had been achieved, heparin was administered intravenously and intra- arterially in order to maintain an ACT of greater than 350  seconds throughout the procedure.  3D Mapping and Ablation: A 3.5 mm [employer]ense [employer] Thermocool ablation catheter was advanced into the right atrium.  The transseptal sheath was pulled back into the IVC over a guidewire.  The ablation catheter was advanced across the transseptal hole using the wire as a guide.  The transseptal sheath was then re-advanced over the guidewire into the left atrium.  A duodecapolar Biosense Webster circular mapping catheter was introduced through the transseptal sheath and positioned over the mouth of all 4 pulmonary veins.  Three-dimensional electroanatomical mapping was performed using CARTO technology.  This demonstrated electrical activity within all four pulmonary veins at baseline. The patient underwent successful sequential electrical  isolation and anatomical encircling of all four pulmonary veins using radiofrequency current with a circular mapping catheter as a guide. A WACA approach was used. Due to persistence of atrial fibrillation, additional left atrial mapping and ablation was performed.  A series of radiofrequency lesions were delivered along the roof and floor of the left atrium in order to create a "standard box" lesion along the posterior wall of the left atrium. The ablation catheter was then pulled back into the right atrial and positioned along the cavo-tricuspid isthmus.  Mapping along the atrial side of the isthmus was performed.  This demonstrated a standard isthmus.  A series of radiofrequency applications were then delivered along the isthmus.  Complete bidirectional cavotricuspid isthmus block was achieved as confirmed by differential atrial pacing from the low lateral right atrium.  A stimulus to earliest atrial activation across the isthmus measured 190 msec bi-directionally.  The patient was observe without return of conduction through the isthmus. Cardioversion: The patient was then cardioverted to sinus rhythm with a single synchronized 200-J biphasic shock with cardioversion electrodes in the anterior-posterior thoracic configuration. he maintained sinus rhythm initially but then had recurence of afib with catheter manipulation within the left atrial, requiring repeat cardioversion with 200J biphasic.  he remained in sinus rhythm thereafter. Measurements Following Ablation: In sinus rhythm the RR interval was 647mec, with PR 325 msec, QRS 130 msec, and Qtc 451 msec.  Following ablation the AH interval measured 68 msec with an HV interval of 62 msec. Ventricular pacing was performed, which revealed midline decremental VA conduction with a VA Wenckebach cycle length of 550 msec. Rapid atrial pacing was performed, which revealed an AV Wenckebach cycle length of 380 msec.  Electroisolation was then again confirmed in all  four pulmonary veins. The procedure was therefore considered completed.  All catheters were removed, and the sheaths were aspirated and flushed.  The patient was transferred to the recovery area for sheath removal per protocol.  A limited bedside transthoracic echocardiogram revealed no pericardial effusion. EBL<164m  There were no early apparent complications. CONCLUSIONS: 1. Atrial fibrillation upon presentation.  2. Successful electrical isolation and anatomical encircling of all four pulmonary veins with radiofrequency current.  A WACA approach was used 3. Additional left atrial ablation was performed with a standard box lesion created along the posterior wall of the left atrium 4. Atrial fibrillation successfully cardioverted to sinus rhythm. 5. Ablation of typical atrial flutter 6. No early apparent complications. Will Martin Camnitz,MD 4:11 PM 05/16/2019   DG CHEST PORT 1 VIEW  Result Date: 05/16/2019 CLINICAL DATA:  Status  loop recorder placement EXAM: PORTABLE CHEST 1 VIEW COMPARISON:  07/05/2017 FINDINGS: Cardiac shadow is mildly enlarged but stable. Aortic calcifications are again seen. New loop recorder is noted. Swan-Ganz catheter is noted within the right lower  lobe pulmonary artery. Endotracheal tube and gastric catheter are noted in satisfactory position. Increased central vascular congestion is noted as well as some patchy density within the bases right greater than left likely related to atelectatic change. No pneumothorax is seen. No bony abnormality is noted. IMPRESSION: Bibasilar opacities likely related atelectasis. Follow-up is recommended. Tubes and lines as described above. Electronically Signed   By: Inez Catalina M.D.   On: 05/16/2019 21:54   ECHO TEE  Result Date: 05/16/2019   TRANSESOPHOGEAL ECHO REPORT   Patient Name:   [name] Date of Exam: 05/16/2019 Medical Rec #:  [id number]        Height:       71.0 in Accession #:    [id number]       Weight:       189.9 lb Date of  Birth:  11-28-1950         BSA:          2.06 m Patient Age:    51 years         BP:           79/59 mmHg Patient Gender: M                HR:           117 bpm. Exam Location:  Inpatient  Procedure: Transesophageal Echo, Color Doppler and Cardiac Doppler Indications:    Atrial fibrillation and Flutter  History:        Patient has prior history of Echocardiogram examinations, most                 recent 09/14/2017.  Sonographer:    Philipp Deputy Referring Phys: [id number] WILL MARTIN CAMNITZ  PROCEDURE: Patients was monitored while under deep sedation. The transesophogeal probe was passed through the esophogus of the patient. Image quality was adequate. The patient developed no complications during the procedure. TEE performed in Cath Lab. IMPRESSIONS  1. Left ventricular ejection fraction, by visual estimation, is 10%. The left ventricle has severely decreased function. Normal left ventricular size. There is moderately increased left ventricular hypertrophy.  2. Left ventricular diastolic function could not be evaluated.  3. The left ventricle demonstrates global hypokinesis.  4. Global right ventricle has moderately reduced systolic function.The right ventricular size is mildly enlarged. No increase in right ventricular wall thickness.  5. Left atrial size was severely dilated.  6. Right atrial size was severely dilated.  7. The mitral valve is normal in structure. Mild mitral valve regurgitation.  8. The tricuspid valve is normal in structure. Tricuspid valve regurgitation is trivial.  9. Aortic valve regurgitation is moderate. 10. The aortic valve is normal in structure. Aortic valve regurgitation is moderate. Mild aortic valve sclerosis without stenosis. 11. The pulmonic valve was grossly normal. Pulmonic valve regurgitation is not visualized. 12. There is mild dilatation of the aortic root measuring 41 mm. 13. Atheromatous plaque, without ulcerated or mobile components, is seen in the distal aortic arch. 14. No  intracardiac thrombi or masses were visualized. FINDINGS  Left Ventricle: Left ventricular ejection fraction, by visual estimation, is 10%. The left ventricle has severely decreased function. The left ventricle demonstrates global hypokinesis. There is moderately increased left ventricular hypertrophy. Concentric left ventricular hypertrophy. Normal left ventricular size. The left ventricular diastology could not be evaluated due to atrial fibrillation. Left ventricular diastolic function could not be evaluated. Right Ventricle: The right ventricular size is mildly enlarged. No increase in right ventricular wall thickness.  Global RV systolic function is has moderately reduced systolic function. Left Atrium: Left atrial size was severely dilated. Right Atrium: Right atrial size was severely dilated Pericardium: There is no evidence of pericardial effusion. Mitral Valve: The mitral valve is normal in structure. There is mild thickening of the mitral valve leaflet(s). Mild mitral valve regurgitation, with eccentric laterally directed jet. Tricuspid Valve: The tricuspid valve is normal in structure. Tricuspid valve regurgitation is trivial. Aortic Valve: The aortic valve is normal in structure. Aortic valve regurgitation is moderate. Aortic regurgitation PHT measures 354 msec. Mild aortic valve sclerosis is present, with no evidence of aortic valve stenosis. Pulmonic Valve: The pulmonic valve was grossly normal. Pulmonic valve regurgitation is not visualized. Aorta: The aortic root, ascending aorta, aortic arch and descending aorta are all structurally normal, with no evidence of dilitation or obstruction. There is mild dilatation of the aortic root measuring 41 mm. Atheromatous plaque, without ulcerated or mobile components, is seen in the distal aortic arch. Shunts: No atrial level shunt detected by color flow Doppler. Additional Comments: No intracardiac thrombi or masses were visualized.  AORTIC VALVE           Normals AI PHT:      354 msec TRICUSPID VALVE             Normals TR Peak grad:   13.7 mmHg TR Vmax:        185.00 cm/s 288 cm/s  Mihai Croitoru MD Electronically signed by Kevin Klein MD Signature Date/Time: 05/16/2019/4:11:23 PM    Final       Medications:     Scheduled Medications: . apixaban  5 mg Oral BID  . chlorhexidine gluconate (MEDLINE KIT)  15 mL Mouth Rinse BID  . Chlorhexidine Gluconate Cloth  6 each Topical Daily  . dofetilide  500 mcg Oral BID  . EPINEPHrine      . glyBURIDE  5 mg Oral Q breakfast  . mouth rinse  15 mL Mouth Rinse 10 times per day  . sodium chloride flush  3 mL Intravenous Q12H     Infusions: . sodium chloride 10 mL/hr at 05/17/19 0800  . sodium chloride    . DOBUTamine 5 mcg/kg/min (05/17/19 0800)  . fentaNYL infusion INTRAVENOUS 50 mcg/hr (05/17/19 0800)  . midazolam 1 mg/hr (05/17/19 0800)  . norepinephrine (LEVOPHED) Adult infusion Stopped (05/17/19 0757)     PRN Medications:  sodium chloride, acetaminophen, albuterol, ondansetron (ZOFRAN) IV, sodium chloride flush    Patient Profile   68 y/oM w/ PAF/AFL, HTN, OSA, CKD stage 3b and systolic HF felt due to tachy-induced CM, admitted for PVI. Post procedural course c/b cardiogenic shock requiring intubation + inotrope/ pressor support.    Assessment/Plan   1. Cardiogenic shock: - NE discontinued - Remains on Dobutamine 5 mcg. Co-ox 70%. Swan in place. CI 2.8, CO 5.95  2. Acute on chronic systolic HF with biventricualr failure due to tachy CM - EF 10% by TEE 12/9 - CVP 10  - requiring inotropes per above.   3. Recurrent AF - s/p successful PVI 12/9. Maintaining NSR.  - management per EP   4. Acute hypoxic respiratory failure. Due to #1&2 - remains intubated  5. AKI on CKD 3b - SCr 2.45 today. Last SCr on file from 04/30/19 was 1.97. Suspect AKI 2/2 cardiogenic shock.  - continue dobutamine. Follow BMP closely.      Length of Stay: 0  Lyda Jester, PA-C   05/17/2019, 8:31 AM  Advanced Heart Failure Team Pager  [phone] (M-F; 7a - 4p)  Please contact [facility] for night-coverage after hours (4p -7a ) and weekends on [email or web address]  Agree with above.   Extubated this afternoon. Awake but a bit confused. Denies SOB. On dobutamine 5 NE off. Renal function slightly worse  Swan #s done with RN personally  CVP 13 PCWP 17 Thermo 5.6/2.7  Appears to be in AFL vs sinus tach ~100  General:  Weak appearing. HEENT: normal Neck: supple.RIJ swan. Carotids 2+ bilat; no bruits. No lymphadenopathy or thryomegaly appreciated. Cor: PMI nondisplaced. Regular tachy + s3 Lungs: clear Abdomen: soft, nontender, nondistended. No hepatosplenomegaly. No bruits or masses. Good bowel sounds. Extremities: no cyanosis, clubbing, rash, edema Neuro: alert moves all 4  Swan numbers improved. Hold lasix for now. Wean dobutamine to 3.   I suspect he may be in AFL or atach (versus sinus tach) EP managing  He developed urinary retention with 1.1L on I/o cath. Will start flomax. May need foley. Dofetilide held due to AKI. Keep in ICU   CRITICAL CARE Performed by: [name]  Total critical care time: 35 minutes  Critical care time was exclusive of separately billable procedures and treating other patients.  Critical care was necessary to treat or prevent imminent or life-threatening deterioration.  Critical care was time spent personally by me (independent of midlevel providers or residents) on the following activities: development of treatment plan with patient and/or surrogate as well as nursing, discussions with consultants, evaluation of patient's response to treatment, examination of patient, obtaining history from patient or surrogate, ordering and performing treatments and interventions, ordering and review of laboratory studies, ordering and review of radiographic studies, pulse oximetry and re-evaluation of patient's condition.  Kevin Bickers,  MD  3:47 PM

## 2019-05-17 NOTE — Progress Notes (Addendum)
Progress Note  Patient Name: [name] Date of Encounter: 05/17/2019  Primary Cardiologist: Sanda Klein, MD  Electrophysiologist: Dr. Curt Bears  Subjective   Awake, on vent, follows commands  Inpatient Medications    Scheduled Meds:  apixaban  5 mg Oral BID   chlorhexidine gluconate (MEDLINE KIT)  15 mL Mouth Rinse BID   Chlorhexidine Gluconate Cloth  6 each Topical Daily   dofetilide  500 mcg Oral BID   EPINEPHrine       glyBURIDE  5 mg Oral Q breakfast   mouth rinse  15 mL Mouth Rinse 10 times per day   pantoprazole sodium  40 mg Per Tube Daily   sodium chloride flush  3 mL Intravenous Q12H   Continuous Infusions:  sodium chloride 10 mL/hr at 05/17/19 0800   sodium chloride     DOBUTamine 5 mcg/kg/min (05/17/19 0800)   fentaNYL infusion INTRAVENOUS 50 mcg/hr (05/17/19 0800)   midazolam 1 mg/hr (05/17/19 0800)   norepinephrine (LEVOPHED) Adult infusion Stopped (05/17/19 0757)   PRN Meds: sodium chloride, acetaminophen, albuterol, ondansetron (ZOFRAN) IV, sodium chloride flush   Vital Signs    Vitals:   05/17/19 0800 05/17/19 0827 05/17/19 0900 05/17/19 1000  BP: 116/71 (!) 128/59 114/69 119/77  Pulse: 88 90 94 100  Resp: '16 12 11 13  '$ Temp: 98.6 F (37 C)  99 F (37.2 C) 99.1 F (37.3 C)  TempSrc:      SpO2: 97% 97% 93% 95%  Weight:      Height:        Intake/Output Summary (Last 24 hours) at 05/17/2019 1036 Last data filed at 05/17/2019 0800 Gross per 24 hour  Intake 2247.26 ml  Output 1450 ml  Net 797.26 ml   Last 3 Weights 05/16/2019 05/01/2019 04/03/2019  Weight (lbs) 189 lb 14.4 oz 190 lb 196 lb 12.8 oz  Weight (kg) 86.138 kg 86.183 kg 89.268 kg      Telemetry    SR, ST, brief PAFib/SVT last night - Personally Reviewed  ECG    SR 87bpm, manually measured QT is 400, QTc 476m - Personally Reviewed  Physical Exam   GEN: sedated but awake, on vent.   Neck: No JVD Cardiac: RRR, no murmurs, rubs, or gallops.    Respiratory: diminished at bases, on vent GI: Soft, nontender, non-distended  MS: No edema; No deformity. Neuro:  follows direction appropriately  Psych: unable to assess   Labs    High Sensitivity Troponin:  No results for input(s): TROPONINIHS in the last 720 hours.    Chemistry Recent Labs  Lab 05/16/19 1532 05/16/19 1609 05/17/19 0535  NA 145 150*   148* 146*  K 4.4 4.1   4.1 3.8  CL  --   --  110  CO2  --   --  22  GLUCOSE  --   --  123*  BUN  --   --  57*  CREATININE  --   --  2.45*  CALCIUM  --   --  8.7*  PROT  --   --  6.0*  ALBUMIN  --   --  3.5  AST  --   --  39  ALT  --   --  24  ALKPHOS  --   --  32*  BILITOT  --   --  2.4*  GFRNONAA  --   --  26*  GFRAA  --   --  31*  ANIONGAP  --   --  14     Hematology Recent Labs  Lab 05/16/19 1532 05/16/19 1609 05/17/19 0535  WBC  --   --  7.3  RBC  --   --  4.18*  HGB 11.9* 11.6*   11.2* 11.0*  HCT 35.0* 34.0*   33.0* 35.8*  MCV  --   --  85.6  MCH  --   --  26.3  MCHC  --   --  30.7  RDW  --   --  17.1*  PLT  --   --  162    BNP Recent Labs  Lab 05/16/19 1200  BNP 995.4*     DDimer No results for input(s): DDIMER in the last 168 hours.   Radiology     DG CHEST PORT 1 VIEW Result Date: 05/16/2019 CLINICAL DATA:  Status  loop recorder placement EXAM: PORTABLE CHEST 1 VIEW COMPARISON:  07/05/2017 FINDINGS: Cardiac shadow is mildly enlarged but stable. Aortic calcifications are again seen. New loop recorder is noted. Swan-Ganz catheter is noted within the right lower lobe pulmonary artery. Endotracheal tube and gastric catheter are noted in satisfactory position. Increased central vascular congestion is noted as well as some patchy density within the bases right greater than left likely related to atelectatic change. No pneumothorax is seen. No bony abnormality is noted. IMPRESSION: Bibasilar opacities likely related atelectasis. Follow-up is recommended. Tubes and lines as described above.  Electronically Signed   By: Inez Catalina M.D.   On: 05/16/2019 21:54     Cardiac Studies   05/16/2019 TEE IMPRESSIONS  1. Left ventricular ejection fraction, by visual estimation, is 10%. The left ventricle has severely decreased function. Normal left ventricular size. There is moderately increased left ventricular hypertrophy.  2. Left ventricular diastolic function could not be evaluated.  3. The left ventricle demonstrates global hypokinesis.  4. Global right ventricle has moderately reduced systolic function.The right ventricular size is mildly enlarged. No increase in right ventricular wall thickness.  5. Left atrial size was severely dilated.  6. Right atrial size was severely dilated.  7. The mitral valve is normal in structure. Mild mitral valve regurgitation.  8. The tricuspid valve is normal in structure. Tricuspid valve regurgitation is trivial.  9. Aortic valve regurgitation is moderate. 10. The aortic valve is normal in structure. Aortic valve regurgitation is moderate. Mild aortic valve sclerosis without stenosis. 11. The pulmonic valve was grossly normal. Pulmonic valve regurgitation is not visualized. 12. There is mild dilatation of the aortic root measuring 41 mm. 13. Atheromatous plaque, without ulcerated or mobile components, is seen in the distal aortic arch. 14. No intracardiac thrombi or masses were visualized.   05/16/2019 EPS/ablation CONCLUSIONS: 1. Atrial fibrillation upon presentation.   2. Successful electrical isolation and anatomical encircling of all four pulmonary veins with radiofrequency current.  A WACA approach was used 3. Additional left atrial ablation was performed with a standard box lesion created along the posterior wall of the left atrium 4. Atrial fibrillation successfully cardioverted to sinus rhythm. 5. Ablation of typical atrial flutter 6. No early apparent complications.   05/16/2019 RHC Findings:  On dobutamine 5 and NE 10  RA =  15  RV =  47/17 PA =  49/32 (41) PCW = 27 Fick cardiac output/index = 5.8/2.8 PVR = 2.4 WU FA sat = 94% PA sat = 67%, 68%  Assessment: 1. Marked volume overload 2. Resuscitated cardiogenic shock with inotrope support     Patient Profile     68 y.o.  male w/PMHx of HTN, HLD, OSA, DM, chronic CHF (diastolic) with intermittent tachy-mediated systolic failure, CKD (III), and paroxysmal AFib uncontolled  Came in yesterday for EPS, ablation procedure, pre-procedure TEE noted LVEF 10% and RV hypokinesis, towards the end of his procedure he developed cardiogenic shock, became difficult to oxygenate treated with vasopressin and neo initially, as well as epi  AHF team called to assist, now on dobutamine and levophed CCM consulted to aid in vent management  Assessment & Plan    1. Paroxysmal AFib     CHA2DS2Vasc is 4, on Eliquis     S/p EPS/ablation yesterday  maintaining SR with breif PSVT/AFib On Tikosyn out patient 573mg BID, continued here K+ 3.8 Mag 2.1 Creat 2.45 (baseline is about 2.0)  His Calc CrCl is 31 today, 38 at baseline, Khamora Karan stop tikosyn   2. Cardiogenic shock 3. Acute CHF exacerbation 4. Acute respiratory failure 5. CKD      Not too far from his baseline 6. DM     SSI for now   Appreciate AHF and CCM help    For questions or updates, please contact CF[place]ingtonPlease consult [email or web address] for contact info under        Signed, RBaldwin Jamaica PA-C  05/17/2019, 10:36 AM    I have seen and examined this patient with RTommye Standard  Agree with above, note added to reflect my findings.  On exam, RRR, no murmurs.  Patient remains intubated.  Fortunately he is also remain in sinus rhythm.  Hopefully he Kimsey Demaree be able to be extubated later today.  He does have an elevated creatinine and acute renal failure.  Due to that, we Lyndy Russman hold off on his dofetilide for now.  Johnatha Zeidman M. Keeshia Sanderlin MD 05/17/2019 11:53 AM

## 2019-05-17 NOTE — Progress Notes (Signed)
Patient resting in bed and became tachycardic in the 120s, sinus arrhythmia and then 150s, sinus arrhythmia. Cardiology fellow paged. No new orders. Will continue to monitor.

## 2019-05-17 NOTE — Progress Notes (Signed)
Chaplain engaged in initial visit with [name] and his wife.  Chaplain offered support and prayers.  Chaplain will follow-up.

## 2019-05-17 NOTE — Procedures (Signed)
Extubation Procedure Note  Patient Details:   Name: [name] DOB: 01/09/1951 MRN: [id number]   Airway Documentation:    Vent end date: 05/17/19 Vent end time: 1415   Evaluation  O2 sats: stable throughout Complications: No apparent complications Patient did tolerate procedure well. Bilateral Breath Sounds: Clear, Diminished   Yes   Patient was extubated to a 4L [place] without any complications, dyspnea or stridor noted. Patient was instructed on IS x 5, highest goal reached was 1552mL. Positive cuff leak.   Cougar Imel L 05/17/2019, 2:15 PM

## 2019-05-17 NOTE — Progress Notes (Signed)
Initial Nutrition Assessment  DOCUMENTATION CODES:   Not applicable  INTERVENTION:   If unable to extubate in 24-48 hrs   -Vital AF 1.2 @ 55 ml/hr via OGT (1320 ml) -30 ml Prostat BID  Provides: 1784 kcals, 129 grams protein, 1071 ml free water.   NUTRITION DIAGNOSIS:   Increased nutrient needs related to post-op healing as evidenced by estimated needs.  GOAL:   Patient will meet greater than or equal to 90% of their needs  MONITOR:   Weight trends, Labs, I & O's, Diet advancement, Skin, Vent status  REASON FOR ASSESSMENT:   Ventilator    ASSESSMENT:   Patient with PMH significant for CHF, AI, HTN, and DM. Presents this admission for elective catheter ablation for a.fib.   12/9- s/p AF ablation, cardiogenic shock, tx to ICU on vent  RD working remotely.  Swan in place. Off levophed. Volume overloaded. Plan for possible extubation later today. RD to leave TF recommendations.   Weight shows to have decreased from 94.8 kg on 3/10 to 86.1 kg this admission. Unable to determine dry wt loss vs fluid fluctuation with CHF history.   Patient is currently intubated on ventilator support MV: 5.7 L/min Temp (24hrs), Avg:98.1 F (36.7 C), Min:95.9 F (35.5 C), Max:99.1 F (37.3 C)    I/O: +687 ml since admit  UOP: 1,250 ml x 24 hrs    Drips: NS @ 20 ml/hr, doubutamine Medications: SS novolog Labs: Na 148 (H) CBG 91-142 Cr 2.45 (AKI 2/2 shock)  Diet Order:   Diet Order            Diet NPO time specified  Diet effective now              EDUCATION NEEDS:   Not appropriate for education at this time  Skin:  Skin Assessment: Reviewed RN Assessment  Last BM:  PTA  Height:   Ht Readings from Last 1 Encounters:  05/16/19 5\' 11"  (1.803 m)    Weight:   Wt Readings from Last 1 Encounters:  05/16/19 86.1 kg    Ideal Body Weight:  78.2 kg  BMI:  Body mass index is 26.49 kg/m.  Estimated Nutritional Needs:   Kcal:  1690 kcal  Protein:  115-130  grams  Fluid:  >/= 1.6 L/day   Mariana Single RD, LDN Clinical Nutrition Pager # - [phone]

## 2019-05-17 NOTE — Progress Notes (Signed)
NAME:  [name], MRN:  [id number], DOB:  [birth year], LOS: 0 ADMISSION DATE:  05/16/2019, CONSULTATION DATE:  12/9 REFERRING MD:  Dr. Curt Bears, CHIEF COMPLAINT:  Cardiogenic shock    Brief History   68 year old male with hx of HFpEF, Afib/ Aflutter currently on Eliquis and dofetilide, AI, HTN, OSA, and DMT2 admitted 12/9 for elective catheter ablation for atrial flutter.  Developed cardiogenic shock post AF ablation and returns to ICU on vasopressors and mechanical ventilation. PCCM consulted for vent management.  Past Medical History  Diastolic HF, Afib, Aflutter, aortic insufficiency, HTN, OSA, HLD, DMT2, former smoker  Significant Hospital Events   12/9 Admitted by EP  Consults:  PCCM  HF  Procedures:  12/9 ETT >> 12/9 TEE  12/9 RHC/ catheter ablation   Significant Diagnostic Tests:  12/9 TEE >> LVEF 10%, moderately reduced RV systolic function  0000000 RHC >> On dobutamine 5 and NE 10 RA = 15  RV = 47/17 PA = 49/32 (41) PCW = 27 Fick cardiac output/index = 5.8/2.8 PVR = 2.4 WU FA sat = 94% PA sat = 67%, 68% Assessment: 1. Marked volume overload 2. Resuscitated cardiogenic shock with inotrope support  Micro Data:  12/5 SARS >> negative  Antimicrobials:  n/a   Interim history/subjective:  Remains on dobutamine Swan catheter in place Pressure support weans today morning  Objective   Blood pressure (!) 128/59, pulse 90, temperature 98.6 F (37 C), resp. rate 12, height 5\' 11"  (1.803 m), weight 86.1 kg, SpO2 97 %. PAP: (46-71)/(16-34) 65/23 CVP:  [3 mmHg-16 mmHg] 11 mmHg CO:  [4.7 L/min-6 L/min] 6 L/min CI:  [2.2 L/min/m2-2.9 L/min/m2] 2.9 L/min/m2  Vent Mode: PSV;CPAP FiO2 (%):  [40 %-70 %] 40 % Set Rate:  [16 bmp] 16 bmp Vt Set:  [600 mL] 600 mL PEEP:  [8 cmH20] 8 cmH20 Pressure Support:  [10 cmH20-12 cmH20] 10 cmH20 Plateau Pressure:  [20 cmH20-22 cmH20] 20 cmH20   Intake/Output Summary (Last 24 hours) at 05/17/2019 1001 Last data  filed at 05/17/2019 0800 Gross per 24 hour  Intake 2247.26 ml  Output 1450 ml  Net 797.26 ml   Filed Weights   05/16/19 0838  Weight: 86.1 kg    Examination: Gen:      No acute distress HEENT:  EOMI, sclera anicteric Neck:     No masses; no thyromegaly, ET tube Lungs:    Clear to auscultation bilaterally; normal respiratory effort CV:         Regular rate and rhythm; no murmurs Abd:      + bowel sounds; soft, non-tender; no palpable masses, no distension Ext:    No edema; adequate peripheral perfusion Skin:      Warm and dry; no rash Neuro: Somnolent, arousable  Resolved Hospital Problem list     Assessment & Plan:  Acute Hypoxic Respiratory Failure  -In the setting of cardiogenic shock post elective ablation  P: Pressure support weans as tolerated Follow chest x-ray, ABG  Cardiogenic shock  HX of systolic CHF HX of A-fib -Outpatient TEE and right heart cath preformed today which reveled severe volume overload resulting in cardiogenic shock requiring inotropic support. -Biventricular failure, LVEF 10%, RVEF moderately reduced as well and Aortic valve regurgitation is moderate   P: Primary management per cardiology Continue Inotropic support.  Currently off Levophed.  Continue dobutamine  HX of OSA with night CPAP -Recently seen at outpatient pulmonary setting with change in CPAP pressure to 10cm H2O -Primary pulmonologist is Dr.  Alva  P: Once extubated resume home CPAP at HS   Management of multiple chronic medical conditions per primary team   Best practice:  Diet: NPO Pain/Anxiety/Delirium protocol (if indicated): PRN fentanyl  VAP protocol (if indicated): in place  DVT prophylaxis: Per primary  GI prophylaxis: PPI Glucose control: SSI Mobility: Bedrest  Code Status: Full Family Communication: per primary  Disposition: ICU  Labs   Significant for BUN 57, creatinine 2.45, hemoglobin 11 No new imaging  Critical care time:   The patient is critically  ill with multiple organ system failure and requires high complexity decision making for assessment and support, frequent evaluation and titration of therapies, advanced monitoring, review of radiographic studies and interpretation of complex data.   Critical Care Time devoted to patient care services, exclusive of separately billable procedures, described in this note is 35 minutes.   Marshell Garfinkel MD [facility] Pager [phone] If no answer call [phone] 05/17/2019, 10:02 AM

## 2019-05-17 NOTE — Progress Notes (Signed)
   Patient has had some urinary retention. Required in and out cath earlier today. Flomax started today. Patient started complaining of pain again this evening. Bladder scan showed 470 cc. Will do another in and out cath at this time but may need Foley next.  Darreld Mclean, PA-C 05/17/2019 8:09 PM

## 2019-05-18 ENCOUNTER — Encounter (HOSPITAL_COMMUNITY): Payer: Self-pay

## 2019-05-18 DIAGNOSIS — R570 Cardiogenic shock: Secondary | ICD-10-CM | POA: Diagnosis not present

## 2019-05-18 DIAGNOSIS — J9690 Respiratory failure, unspecified, unspecified whether with hypoxia or hypercapnia: Secondary | ICD-10-CM

## 2019-05-18 DIAGNOSIS — I4819 Other persistent atrial fibrillation: Secondary | ICD-10-CM | POA: Diagnosis not present

## 2019-05-18 LAB — CBC
HCT: 35.6 % — ABNORMAL LOW (ref 39.0–52.0)
Hemoglobin: 10.7 g/dL — ABNORMAL LOW (ref 13.0–17.0)
MCH: 26.3 pg (ref 26.0–34.0)
MCHC: 30.1 g/dL (ref 30.0–36.0)
MCV: 87.5 fL (ref 80.0–100.0)
Platelets: 150 10*3/uL (ref 150–400)
RBC: 4.07 MIL/uL — ABNORMAL LOW (ref 4.22–5.81)
RDW: 17.2 % — ABNORMAL HIGH (ref 11.5–15.5)
WBC: 7.5 10*3/uL (ref 4.0–10.5)
nRBC: 0.5 % — ABNORMAL HIGH (ref 0.0–0.2)

## 2019-05-18 LAB — GLUCOSE, CAPILLARY
Glucose-Capillary: 71 mg/dL (ref 70–99)
Glucose-Capillary: 73 mg/dL (ref 70–99)
Glucose-Capillary: 82 mg/dL (ref 70–99)
Glucose-Capillary: 94 mg/dL (ref 70–99)
Glucose-Capillary: 97 mg/dL (ref 70–99)

## 2019-05-18 LAB — BASIC METABOLIC PANEL
Anion gap: 11 (ref 5–15)
BUN: 48 mg/dL — ABNORMAL HIGH (ref 8–23)
CO2: 25 mmol/L (ref 22–32)
Calcium: 8.9 mg/dL (ref 8.9–10.3)
Chloride: 113 mmol/L — ABNORMAL HIGH (ref 98–111)
Creatinine, Ser: 2.02 mg/dL — ABNORMAL HIGH (ref 0.61–1.24)
GFR calc Af Amer: 39 mL/min — ABNORMAL LOW (ref >60)
GFR calc non Af Amer: 33 mL/min — ABNORMAL LOW (ref >60)
Glucose, Bld: 52 mg/dL — ABNORMAL LOW (ref 70–99)
Potassium: 3.4 mmol/L — ABNORMAL LOW (ref 3.5–5.1)
Sodium: 149 mmol/L — ABNORMAL HIGH (ref 135–145)

## 2019-05-18 LAB — COOXEMETRY PANEL
Carboxyhemoglobin: 1.3 % (ref 0.5–1.5)
Methemoglobin: 0.5 % (ref 0.0–1.5)
O2 Saturation: 61.1 %
Total hemoglobin: 12.5 g/dL (ref 12.0–16.0)

## 2019-05-18 LAB — MAGNESIUM: Magnesium: 2 mg/dL (ref 1.7–2.4)

## 2019-05-18 MED ORDER — POTASSIUM CHLORIDE CRYS ER 20 MEQ PO TBCR
40.0000 meq | EXTENDED_RELEASE_TABLET | Freq: Once | ORAL | Status: AC
  Administered 2019-05-18: 40 meq via ORAL
  Filled 2019-05-18: qty 2

## 2019-05-18 MED ORDER — PANTOPRAZOLE SODIUM 40 MG PO TBEC
40.0000 mg | DELAYED_RELEASE_TABLET | Freq: Every day | ORAL | Status: DC
  Administered 2019-05-18 – 2019-05-29 (×12): 40 mg via ORAL
  Filled 2019-05-18 (×12): qty 1

## 2019-05-18 MED ORDER — ENSURE ENLIVE PO LIQD
237.0000 mL | Freq: Two times a day (BID) | ORAL | Status: DC
  Administered 2019-05-19 – 2019-05-29 (×12): 237 mL via ORAL

## 2019-05-18 MED ORDER — AMIODARONE LOAD VIA INFUSION
150.0000 mg | Freq: Once | INTRAVENOUS | Status: AC
  Administered 2019-05-18: 150 mg via INTRAVENOUS
  Filled 2019-05-18: qty 83.34

## 2019-05-18 MED ORDER — TRAMADOL HCL 50 MG PO TABS
50.0000 mg | ORAL_TABLET | Freq: Two times a day (BID) | ORAL | Status: DC | PRN
  Administered 2019-05-18 – 2019-05-25 (×10): 50 mg via ORAL
  Filled 2019-05-18 (×12): qty 1

## 2019-05-18 MED ORDER — AMIODARONE HCL IN DEXTROSE 360-4.14 MG/200ML-% IV SOLN
60.0000 mg/h | INTRAVENOUS | Status: DC
  Administered 2019-05-18 (×2): 60 mg/h via INTRAVENOUS
  Filled 2019-05-18 (×2): qty 200

## 2019-05-18 MED ORDER — TRAMADOL HCL 50 MG PO TABS: 50.0000 mg | ORAL_TABLET | Freq: Four times a day (QID) | ORAL | Status: DC | PRN

## 2019-05-18 MED ORDER — AMIODARONE HCL IN DEXTROSE 360-4.14 MG/200ML-% IV SOLN
30.0000 mg/h | INTRAVENOUS | Status: DC
  Administered 2019-05-18 – 2019-05-21 (×5): 30 mg/h via INTRAVENOUS
  Filled 2019-05-18 (×6): qty 200

## 2019-05-18 MED ORDER — OXYBUTYNIN CHLORIDE ER 5 MG PO TB24
5.0000 mg | ORAL_TABLET | Freq: Every day | ORAL | Status: DC
  Administered 2019-05-18: 5 mg via ORAL
  Filled 2019-05-18 (×2): qty 1

## 2019-05-18 MED ORDER — FUROSEMIDE 10 MG/ML IJ SOLN
80.0000 mg | Freq: Two times a day (BID) | INTRAMUSCULAR | Status: DC
  Administered 2019-05-18 – 2019-05-19 (×3): 80 mg via INTRAVENOUS
  Filled 2019-05-18 (×3): qty 8

## 2019-05-18 MED ORDER — MENTHOL 3 MG MT LOZG
1.0000 | LOZENGE | OROMUCOSAL | Status: DC | PRN
  Filled 2019-05-18 (×2): qty 9

## 2019-05-18 MED ORDER — GUAIFENESIN 100 MG/5ML PO SOLN
5.0000 mL | ORAL | Status: DC | PRN
  Administered 2019-05-18: 100 mg via ORAL
  Filled 2019-05-18: qty 5

## 2019-05-18 NOTE — Progress Notes (Signed)
NAME:  [name], MRN:  [id number], DOB:  1950-10-29, LOS: 1 ADMISSION DATE:  05/16/2019, CONSULTATION DATE:  12/9 REFERRING MD:  Dr. Curt Bears, CHIEF COMPLAINT:  Cardiogenic shock    Brief History   68 year old male with hx of HFpEF, Afib/ Aflutter currently on Eliquis and dofetilide, AI, HTN, OSA, and DMT2 admitted 12/9 for elective catheter ablation for atrial flutter.  Developed cardiogenic shock post AF ablation and returns to ICU on vasopressors and mechanical ventilation. PCCM consulted for vent management.  Past Medical History  Diastolic HF, Afib, Aflutter, aortic insufficiency, HTN, OSA, HLD, DMT2, former smoker  Significant Hospital Events   12/9 Admitted by EP  Consults:  PCCM  HF  Procedures:  12/9 ETT >> 12/9 TEE  12/9 RHC/ catheter ablation   Significant Diagnostic Tests:  12/9 TEE >> LVEF 10%, moderately reduced RV systolic function  0000000 RHC >> On dobutamine 5 and NE 10 RA = 15  RV = 47/17 PA = 49/32 (41) PCW = 27 Fick cardiac output/index = 5.8/2.8 PVR = 2.4 WU FA sat = 94% PA sat = 67%, 68% Assessment: 1. Marked volume overload 2. Resuscitated cardiogenic shock with inotrope support  Micro Data:  12/5 SARS >> negative  Antimicrobials:  n/a   Interim history/subjective:  Extubated yesterday, respiratory status has remained stable No complaints today morning.  Objective   Blood pressure 134/84, pulse (!) 116, temperature 99.7 F (37.6 C), resp. rate (!) 26, height 5\' 11"  (1.803 m), weight 86.1 kg, SpO2 99 %. PAP: (46-76)/(17-40) 70/39 CVP:  [1 mmHg-18 mmHg] 14 mmHg PCWP:  [17 mmHg] 17 mmHg CO:  [5.6 L/min] 5.6 L/min CI:  [2.7 L/min/m2] 2.7 L/min/m2  Vent Mode: CPAP;PSV FiO2 (%):  [40 %] 40 % PEEP:  [5 cmH20] 5 cmH20 Pressure Support:  [5 cmH20-10 cmH20] 5 cmH20   Intake/Output Summary (Last 24 hours) at 05/18/2019 0944 Last data filed at 05/18/2019 0700 Gross per 24 hour  Intake 708.67 ml  Output 3130 ml  Net -2421.33  ml   Filed Weights   05/16/19 0838  Weight: 86.1 kg    Examination: Gen:      No acute distress HEENT:  EOMI, sclera anicteric, ETT Neck:     No masses; no thyromegaly Lungs:    Clear to auscultation bilaterally; normal respiratory effort CV:         Regular rate and rhythm; no murmurs Abd:      + bowel sounds; soft, non-tender; no palpable masses, no distension Ext:    No edema; adequate peripheral perfusion Skin:      Warm and dry; no rash Neuro: alert and oriented x 3 Psych: normal mood and affect  Resolved Hospital Problem list     Assessment & Plan:  Acute Hypoxic Respiratory Failure  -In the setting of cardiogenic shock post elective ablation  P: Stable post extubation Wean down oxygen as tolerated  Cardiogenic shock  HX of systolic CHF HX of A-fib -Outpatient TEE and right heart cath preformed today which reveled severe volume overload resulting in cardiogenic shock requiring inotropic support. -Biventricular failure, LVEF 10%, RVEF moderately reduced as well and Aortic valve regurgitation is moderate   P: Primary management per cardiology On Inotropic support with dobutamine  HX of OSA with night CPAP -Recently seen at outpatient pulmonary setting with change in CPAP pressure to 10cm H2O -Primary pulmonologist is Dr. Elsworth Soho  P: Resume home CPAP at [place] will be available as needed.  Please  call with any questions  Best practice:  Diet: PO diet Pain/Anxiety/Delirium protocol (if indicated): NA VAP protocol (if indicated): NA  DVT prophylaxis: Per primary  GI prophylaxis: PPI Glucose control: SSI Mobility: Bedrest  Code Status: Full Family Communication: per primary  Disposition: ICU  Labs   Sodium 149, potassium 3.4, BUN/creatinine 48/2.02 No new imaging  Marshell Garfinkel MD [facility] 05/18/2019, 9:44 AM

## 2019-05-18 NOTE — Progress Notes (Addendum)
Progress Note  Patient Name: [name] Date of Encounter: 05/18/2019  Primary Cardiologist: Sanda Klein, MD  Electrophysiologist: Dr. Curt Bears  Subjective   Extubated, discomfort with foley placement, no CP, no overt SOB  Inpatient Medications    Scheduled Meds:  apixaban  5 mg Oral BID   Chlorhexidine Gluconate Cloth  6 each Topical Daily   insulin aspart  0-5 Units Subcutaneous QHS   insulin aspart  0-9 Units Subcutaneous TID WC   mouth rinse  15 mL Mouth Rinse BID   mouth rinse  15 mL Mouth Rinse BID   pantoprazole sodium  40 mg Per Tube Daily   sodium chloride flush  3 mL Intravenous Q12H   tamsulosin  0.4 mg Oral Daily   Continuous Infusions:  sodium chloride 20 mL/hr at 05/18/19 0700   sodium chloride     DOBUTamine 3 mcg/kg/min (05/18/19 0700)   PRN Meds: sodium chloride, acetaminophen, albuterol, ondansetron (ZOFRAN) IV, sodium chloride flush   Vital Signs    Vitals:   05/18/19 0400 05/18/19 0500 05/18/19 0600 05/18/19 0700  BP: 128/88 126/69 (!) 147/88 134/84  Pulse: (!) 113 (!) 110 (!) 120 (!) 116  Resp: (!) 24 (!) 24 (!) 27 (!) 26  Temp: 99.3 F (37.4 C) 99.1 F (37.3 C) 99 F (37.2 C) 99.7 F (37.6 C)  TempSrc: Core     SpO2: 94% 96% 100% 99%  Weight:      Height:        Intake/Output Summary (Last 24 hours) at 05/18/2019 0830 Last data filed at 05/18/2019 0700 Gross per 24 hour  Intake 729.45 ml  Output 3130 ml  Net -2400.55 ml   Last 3 Weights 05/16/2019 05/01/2019 04/03/2019  Weight (lbs) 189 lb 14.4 oz 190 lb 196 lb 12.8 oz  Weight (kg) 86.138 kg 86.183 kg 89.268 kg      Telemetry    SR/ST, paroxysmal ATach 120's - Personally Reviewed  ECG    Yesterday was SR 87bpm, manually measured QT is 400, QTc 45ms - Personally Reviewed  Physical Exam   GEN: AAOx4.   Neck: No JVD Cardiac: RRR, tachycardic, no murmurs, rubs, or gallops.  Respiratory: diminished at bases, on vent GI: Soft, nontender, non-distended  MS: No  edema; No deformity. Neuro:  no gross focal deficits appreciated Psych: very pleasent   Labs    High Sensitivity Troponin:  No results for input(s): TROPONINIHS in the last 720 hours.    Chemistry Recent Labs  Lab 05/16/19 1609 05/17/19 0535 05/18/19 0359  NA 150*  148* 146* 149*  K 4.1  4.1 3.8 3.4*  CL  --  110 113*  CO2  --  22 25  GLUCOSE  --  123* 52*  BUN  --  57* 48*  CREATININE  --  2.45* 2.02*  CALCIUM  --  8.7* 8.9  PROT  --  6.0*  --   ALBUMIN  --  3.5  --   AST  --  39  --   ALT  --  24  --   ALKPHOS  --  32*  --   BILITOT  --  2.4*  --   GFRNONAA  --  26* 33*  GFRAA  --  31* 39*  ANIONGAP  --  14 11     Hematology Recent Labs  Lab 05/16/19 1609 05/17/19 0535 05/18/19 0359  WBC  --  7.3 7.5  RBC  --  4.18* 4.07*  HGB 11.6*  11.2*  11.0* 10.7*  HCT 34.0*  33.0* 35.8* 35.6*  MCV  --  85.6 87.5  MCH  --  26.3 26.3  MCHC  --  30.7 30.1  RDW  --  17.1* 17.2*  PLT  --  162 150    BNP Recent Labs  Lab 05/16/19 1200  BNP 995.4*     DDimer No results for input(s): DDIMER in the last 168 hours.   Radiology     DG CHEST PORT 1 VIEW Result Date: 05/16/2019 CLINICAL DATA:  Status  loop recorder placement EXAM: PORTABLE CHEST 1 VIEW COMPARISON:  07/05/2017 FINDINGS: Cardiac shadow is mildly enlarged but stable. Aortic calcifications are again seen. New loop recorder is noted. Swan-Ganz catheter is noted within the right lower lobe pulmonary artery. Endotracheal tube and gastric catheter are noted in satisfactory position. Increased central vascular congestion is noted as well as some patchy density within the bases right greater than left likely related to atelectatic change. No pneumothorax is seen. No bony abnormality is noted. IMPRESSION: Bibasilar opacities likely related atelectasis. Follow-up is recommended. Tubes and lines as described above. Electronically Signed   By: Inez Catalina M.D.   On: 05/16/2019 21:54     Cardiac Studies    05/16/2019 TEE IMPRESSIONS  1. Left ventricular ejection fraction, by visual estimation, is 10%. The left ventricle has severely decreased function. Normal left ventricular size. There is moderately increased left ventricular hypertrophy.  2. Left ventricular diastolic function could not be evaluated.  3. The left ventricle demonstrates global hypokinesis.  4. Global right ventricle has moderately reduced systolic function.The right ventricular size is mildly enlarged. No increase in right ventricular wall thickness.  5. Left atrial size was severely dilated.  6. Right atrial size was severely dilated.  7. The mitral valve is normal in structure. Mild mitral valve regurgitation.  8. The tricuspid valve is normal in structure. Tricuspid valve regurgitation is trivial.  9. Aortic valve regurgitation is moderate. 10. The aortic valve is normal in structure. Aortic valve regurgitation is moderate. Mild aortic valve sclerosis without stenosis. 11. The pulmonic valve was grossly normal. Pulmonic valve regurgitation is not visualized. 12. There is mild dilatation of the aortic root measuring 41 mm. 13. Atheromatous plaque, without ulcerated or mobile components, is seen in the distal aortic arch. 14. No intracardiac thrombi or masses were visualized.    05/16/2019 EPS/ablation CONCLUSIONS: 1. Atrial fibrillation upon presentation.   2. Successful electrical isolation and anatomical encircling of all four pulmonary veins with radiofrequency current.  A WACA approach was used 3. Additional left atrial ablation was performed with a standard box lesion created along the posterior wall of the left atrium 4. Atrial fibrillation successfully cardioverted to sinus rhythm. 5. Ablation of typical atrial flutter 6. No early apparent complications.    05/16/2019 RHC Findings:   On dobutamine 5 and NE 10   RA = 15  RV =  47/17 PA =  49/32 (41) PCW = 27 Fick cardiac output/index = 5.8/2.8 PVR = 2.4  WU FA sat = 94% PA sat = 67%, 68%   Assessment: 1. Marked volume overload 2. Resuscitated cardiogenic shock with inotrope support     Patient Profile     68 y.o. male w/PMHx of HTN, HLD, OSA, DM, chronic CHF (diastolic) with intermittent tachy-mediated systolic failure, CKD (III), and paroxysmal AFib uncontolled  Came in yesterday for EPS, ablation procedure, pre-procedure TEE noted LVEF 10% and RV hypokinesis, towards the end of his procedure he developed cardiogenic  shock, became difficult to oxygenate treated with vasopressin and neo initially, as well as epi  AHF team called to assist, now on dobutamine and levophed CCM consulted to aid in vent management  Assessment & Plan    1. Paroxysmal AFib     CHA2DS2Vasc is 4, on Eliquis, no missed doses since his procedure    S/p EPS/ablation 05/16/2019, Dr. Curt Bears  maintaining SR with intermittent Atach episodes Off Tikosyn given renal insufficiency  Emelina Hinch get EKG today, last dose of tikosyn was yesterday 1018 Discussed with RPH, timing to start amiodarone, no data regarding tikosyn >> amio Tariah Transue see how his QT looks today The patient mentions historically significant GI intolerance to amiodarone, may just use IV amio while here    2. Cardiogenic shock 3. Acute CHF exacerbation          PCW this AM 30      Coox 81.1      - 2469ml  4. Acute respiratory failure     Extubated yesterday  5. CKD (III)     Today is better, looks at his baseline  6. DM     SSI for now    Appreciate AHF and CCM help    For questions or updates, please contact Tool Please consult [email or web address] for contact info under        Signed, Baldwin [place], PA-C  05/18/2019, 8:30 AM     I have seen and examined this patient with Tommye Standard.  Agree with above, note added to reflect my findings.  On exam, tachycardic, no murmurs. Patient feeling much improved today with much less SOB. Remains in and out of likely atrial  tachycardia. Tysha Grismore plan for amiodarone in the short term.    Kolin Erdahl M. Asiah Browder MD 05/18/2019 12:02 PM

## 2019-05-18 NOTE — Progress Notes (Signed)
Patient will place self on CPAP when ready. Patient informed to call RT if need assistance.

## 2019-05-18 NOTE — Progress Notes (Addendum)
Advanced Heart Failure Rounding Note  PCP-Cardiologist: Sanda Klein, MD   Subjective:    68 y/o PAF/AFL, HTN, OSA, CKD 3b and systolic HF felt due to tachy-induced CM  Admitted 12/9  for PVI. Pre-op TEE LVEF 10% RV HK.  Had successful PVI. Post PVI developed cardiogenic shock. Treated initially with vasopressin and neo. Then bolused with epi. We switched to dobutamine and NE with good effect. Swan placed in cath lab with elevated filling pressures and normal output on pressors .  Extubated 12/10  NE discontinued. Weaning down Dobutamine, now on 3 mcg/kg/min. Co-ox 61%.   Feels ok. Developed urinary retention overnight and had Foley placed. Denies SOB, orthopnea or PND.  On tele in/out sinus and atrial tach  Swan #s done personally; RA 16 PA 74/44 (55) PCWP 30  Fick 6.4/3.1 SVR 1069 PVR 3.9   Objective:   Weight Range: 86.1 kg Body mass index is 26.49 kg/m.   Vital Signs:   Temp:  [98.8 F (37.1 C)-100 F (37.8 C)] 99.7 F (37.6 C) (12/11 0700) Pulse Rate:  [92-128] 116 (12/11 0700) Resp:  [11-31] 26 (12/11 0700) BP: (95-147)/(51-88) 134/84 (12/11 0700) SpO2:  [81 %-100 %] 99 % (12/11 0700) Arterial Line BP: (109-165)/(46-76) 145/66 (12/11 0700) FiO2 (%):  [40 %] 40 % (12/10 1200)    Weight change: Filed Weights   05/16/19 0838  Weight: 86.1 kg    Intake/Output:   Intake/Output Summary (Last 24 hours) at 05/18/2019 0828 Last data filed at 05/18/2019 0700 Gross per 24 hour  Intake 729.45 ml  Output 3130 ml  Net -2400.55 ml      Physical Exam    General:  Lying in bed. No resp difficulty HEENT: normal Neck: supple. JVP up to ear. Carotids 2+ bilat; no bruits. No lymphadenopathy or thryomegaly appreciated. Cor: PMI nondisplaced. Regular tachy  2/6 MR Lungs: clear Abdomen: soft, nontender, nondistended. No hepatosplenomegaly. No bruits or masses. Good bowel sounds. Extremities: no cyanosis, clubbing, rash, edema Neuro: alert & orientedx3,  cranial nerves grossly intact. moves all 4 extremities w/o difficulty. Affect pleasant   Telemetry   Sinus tach/atrial tach 90-110 Personally reviewed    Labs    CBC Recent Labs    05/17/19 0535 05/18/19 0359  WBC 7.3 7.5  HGB 11.0* 10.7*  HCT 35.8* 35.6*  MCV 85.6 87.5  PLT 162 [id number]   Basic Metabolic Panel Recent Labs    05/17/19 0535 05/18/19 0359  NA 146* 149*  K 3.8 3.4*  CL 110 113*  CO2 22 25  GLUCOSE 123* 52*  BUN 57* 48*  CREATININE 2.45* 2.02*  CALCIUM 8.7* 8.9  MG 2.1 2.0   Liver Function Tests Recent Labs    05/17/19 0535  AST 39  ALT 24  ALKPHOS 32*  BILITOT 2.4*  PROT 6.0*  ALBUMIN 3.5   No results for input(s): LIPASE, AMYLASE in the last 72 hours. Cardiac Enzymes No results for input(s): CKTOTAL, CKMB, CKMBINDEX, TROPONINI in the last 72 hours.  BNP: BNP (last 3 results) Recent Labs    05/16/19 1200  BNP 995.4*    ProBNP (last 3 results) No results for input(s): PROBNP in the last 8760 hours.   D-Dimer No results for input(s): DDIMER in the last 72 hours. Hemoglobin A1C No results for input(s): HGBA1C in the last 72 hours. Fasting Lipid Panel No results for input(s): CHOL, HDL, LDLCALC, TRIG, CHOLHDL, LDLDIRECT in the last 72 hours. Thyroid Function Tests No results for input(s): TSH, T4TOTAL,  T3FREE, THYROIDAB in the last 72 hours.  Invalid input(s): FREET3  Other results:   Imaging    No results found.   Medications:     Scheduled Medications: . apixaban  5 mg Oral BID  . Chlorhexidine Gluconate Cloth  6 each Topical Daily  . insulin aspart  0-5 Units Subcutaneous QHS  . insulin aspart  0-9 Units Subcutaneous TID WC  . mouth rinse  15 mL Mouth Rinse BID  . mouth rinse  15 mL Mouth Rinse BID  . pantoprazole sodium  40 mg Per Tube Daily  . sodium chloride flush  3 mL Intravenous Q12H  . tamsulosin  0.4 mg Oral Daily    Infusions: . sodium chloride 20 mL/hr at 05/18/19 0700  . sodium chloride    .  DOBUTamine 3 mcg/kg/min (05/18/19 0700)  . fentaNYL infusion INTRAVENOUS Stopped (05/17/19 1025)  . midazolam Stopped (05/17/19 0830)  . norepinephrine (LEVOPHED) Adult infusion Stopped (05/17/19 0757)    PRN Medications: sodium chloride, acetaminophen, albuterol, ondansetron (ZOFRAN) IV, sodium chloride flush    Patient Profile   68 y/oM w/ PAF/AFL, HTN, OSA, CKD stage 3b and systolic HF felt due to tachy-induced CM, admitted for PVI. Post procedural course c/b cardiogenic shock requiring intubation + inotrope/ pressor support.    Assessment/Plan   1. Cardiogenic shock: - NE discontinued - Remains on Dobutamine 3 mcg. Outputs look good will decrease dobutamine to 1.5. Follow swan and co-ox  2. Acute on chronic systolic HF with biventricualr failure due to tachy CM - EF 10% by TEE 12/9 likely due to tachy CM - Volume up today. Start lasix 80IV bid - requiring inotropes as above - hopefully EF with recover with treatment of AF  3. Recurrent AF - s/p successful PVI 12/9.  - d/w EP at bedside. Has been on Tikosyn - now in/out sinus tach/atrial tach - Tikosyn off due to AKI - has not tolerated po amio before but may benefit from short course of IV - Continue Eliquis - EP managing  4. Acute hypoxic respiratory failure.  - extubated. stable  5. AKI on CKD 3b - SCr peaked at 2.45 2.45 today. Suspect AKI 2/2 cardiogenic shock.  - Last SCr on file from 04/30/19 was 1.97.  - Cr 2.02 today - Follow with diuresis  6. Urinary retention - has > 1.1 L in bladder - Foley now placed. Check UA - Flomax started  CRITICAL CARE Performed by: [name]  Total critical care time: 35 minutes  Critical care time was exclusive of separately billable procedures and treating other patients.  Critical care was necessary to treat or prevent imminent or life-threatening deterioration.  Critical care was time spent personally by me (independent of midlevel providers or residents)  on the following activities: development of treatment plan with patient and/or surrogate as well as nursing, discussions with consultants, evaluation of patient's response to treatment, examination of patient, obtaining history from patient or surrogate, ordering and performing treatments and interventions, ordering and review of laboratory studies, ordering and review of radiographic studies, pulse oximetry and re-evaluation of patient's condition.  Length of Stay: 1  [name], MD  9:30 AM   Advanced Heart Failure Team Pager [phone] (M-F; 7a - 4p)  Please contact [facility] for night-coverage after hours (4p -7a ) and weekends on [email or web address]

## 2019-05-18 NOTE — Progress Notes (Signed)
Inpatient Diabetes Program Recommendations  AACE/ADA: New Consensus Statement on Inpatient Glycemic Control (2015)  Target Ranges:  Prepandial:   less than 140 mg/dL      Peak postprandial:   less than 180 mg/dL (1-2 hours)      Critically ill patients:  140 - 180 mg/dL   Lab Results  Component Value Date   GLUCAP 94 05/18/2019   HGBA1C 5.8 02/06/2019    Review of Glycemic Control Results for [name], [name] (MRN [id number]) as of 05/18/2019 13:08  Ref. Range 05/17/2019 22:48 05/17/2019 23:14 05/18/2019 00:00 05/18/2019 06:56 05/18/2019 11:44  Glucose-Capillary Latest Ref Range: 70 - 99 mg/dL 40 (LL) 70 97 71 94   Diabetes history: DM2 Outpatient Diabetes medications: Glyburide 5 mg qd + Metformin 500 mg tid Current orders for Inpatient glycemic control: Novolog sensitive correction + hs 0-5 units  Inpatient Diabetes Program Recommendations:   May consider decrease in Novolog correction scale to custom. -Custom Novolog correction scale 0-5 units       151-200  1 unit      201-250  2 units      251-300  3 units      301-350  4 units      351-400  5 units  Noted hypoglycemia post Glyburide po given-evaluate need for Glyburide on discharge home.  Thank you, Nani Gasser. Gerlean Cid, RN, MSN, CDE  Diabetes Coordinator Inpatient Glycemic Control Team Team Pager [phone] (8am-5pm) 05/18/2019 1:10 PM

## 2019-05-18 NOTE — Progress Notes (Signed)
Per discussions with cardiology at beginning of shift- pt still uanble to void. Will place foley per order of Dr. Sarajane Jews.

## 2019-05-18 NOTE — Progress Notes (Signed)
 Nutrition Follow-up  DOCUMENTATION CODES:   Not applicable  INTERVENTION:   Ensure Enlive po BID, each supplement provides 350 kcal and 20 grams of protein  Diet advancement as tolerated  NUTRITION DIAGNOSIS:   Increased nutrient needs related to post-op healing as evidenced by estimated needs.  Being addressed via diet advancement, supplements  GOAL:   Patient will meet greater than or equal to 90% of their needs  Progressing   MONITOR:   Weight trends, Labs, I & O's, Diet advancement, Skin, Vent status  REASON FOR ASSESSMENT:   Ventilator    ASSESSMENT:   Patient with PMH significant for CHF, AI, HTN, and DM. Presents this admission for elective catheter ablation for a.fib.  RD working remotely.  12/09 s/p AF ablation, cardiogenic shock, tx to ICU on vent 12/10 Extubated  Diet advanced to CL  Noted hypoglycemia overnight  Weight shows to have decreased from 94.8 kg on 3/10 to 86.1 kg this admission. Unable to determine dry wt loss vs fluid fluctuation with CHF history.   Labs: sodium 149 (H), potassium 3.4 (L), Creatinine 2.02, BUN 48, CBGs 39-97 Meds: lasix, ss novolog, dobutamine   Diet Order:   Diet Order            Diet clear liquid Room service appropriate? Yes; Fluid consistency: Thin  Diet effective now              EDUCATION NEEDS:   Not appropriate for education at this time  Skin:  Skin Assessment: Reviewed RN Assessment  Last BM:  12/10  Height:   Ht Readings from Last 1 Encounters:  05/16/19 5\' 11"  (1.803 m)    Weight:   Wt Readings from Last 1 Encounters:  05/16/19 86.1 kg    Ideal Body Weight:  78.2 kg  BMI:  Body mass index is 26.49 kg/m.  Estimated Nutritional Needs:   Kcal:  2000-2200 kcals  Protein:  100-115 g  Fluid:  >/ 1.8 L    Jaisen Wiltrout MS, RDN, LDN, CNSC [phone] Pager  [phone] Weekend/On-Call Pager

## 2019-05-19 ENCOUNTER — Inpatient Hospital Stay: Payer: Self-pay

## 2019-05-19 DIAGNOSIS — N179 Acute kidney failure, unspecified: Secondary | ICD-10-CM

## 2019-05-19 DIAGNOSIS — I5043 Acute on chronic combined systolic (congestive) and diastolic (congestive) heart failure: Secondary | ICD-10-CM

## 2019-05-19 DIAGNOSIS — N1832 Chronic kidney disease, stage 3b: Secondary | ICD-10-CM

## 2019-05-19 LAB — BASIC METABOLIC PANEL
Anion gap: 12 (ref 5–15)
BUN: 34 mg/dL — ABNORMAL HIGH (ref 8–23)
CO2: 25 mmol/L (ref 22–32)
Calcium: 8.9 mg/dL (ref 8.9–10.3)
Chloride: 106 mmol/L (ref 98–111)
Creatinine, Ser: 1.81 mg/dL — ABNORMAL HIGH (ref 0.61–1.24)
GFR calc Af Amer: 44 mL/min — ABNORMAL LOW (ref >60)
GFR calc non Af Amer: 38 mL/min — ABNORMAL LOW (ref >60)
Glucose, Bld: 90 mg/dL (ref 70–99)
Potassium: 3.5 mmol/L (ref 3.5–5.1)
Sodium: 143 mmol/L (ref 135–145)

## 2019-05-19 LAB — CBC
HCT: 33.6 % — ABNORMAL LOW (ref 39.0–52.0)
Hemoglobin: 10.3 g/dL — ABNORMAL LOW (ref 13.0–17.0)
MCH: 26.3 pg (ref 26.0–34.0)
MCHC: 30.7 g/dL (ref 30.0–36.0)
MCV: 85.9 fL (ref 80.0–100.0)
Platelets: 147 10*3/uL — ABNORMAL LOW (ref 150–400)
RBC: 3.91 MIL/uL — ABNORMAL LOW (ref 4.22–5.81)
RDW: 16.9 % — ABNORMAL HIGH (ref 11.5–15.5)
WBC: 5.5 10*3/uL (ref 4.0–10.5)
nRBC: 0.7 % — ABNORMAL HIGH (ref 0.0–0.2)

## 2019-05-19 LAB — GLUCOSE, CAPILLARY
Glucose-Capillary: 85 mg/dL (ref 70–99)
Glucose-Capillary: 110 mg/dL — ABNORMAL HIGH (ref 70–99)
Glucose-Capillary: 134 mg/dL — ABNORMAL HIGH (ref 70–99)
Glucose-Capillary: 186 mg/dL — ABNORMAL HIGH (ref 70–99)
Glucose-Capillary: 189 mg/dL — ABNORMAL HIGH (ref 70–99)
Glucose-Capillary: 181 mg/dL — ABNORMAL HIGH (ref 70–99)

## 2019-05-19 LAB — COOXEMETRY PANEL
Carboxyhemoglobin: 1.4 % (ref 0.5–1.5)
Methemoglobin: 0.6 % (ref 0.0–1.5)
O2 Saturation: 57 %
Total hemoglobin: 10.8 g/dL — ABNORMAL LOW (ref 12.0–16.0)

## 2019-05-19 LAB — MAGNESIUM: Magnesium: 1.6 mg/dL — ABNORMAL LOW (ref 1.7–2.4)

## 2019-05-19 MED ORDER — FUROSEMIDE 10 MG/ML IJ SOLN
80.0000 mg | Freq: Once | INTRAMUSCULAR | Status: AC
  Administered 2019-05-19: 80 mg via INTRAVENOUS

## 2019-05-19 MED ORDER — ALPRAZOLAM 0.5 MG PO TABS
0.5000 mg | ORAL_TABLET | Freq: Two times a day (BID) | ORAL | Status: DC | PRN
  Administered 2019-05-20 – 2019-05-29 (×10): 0.5 mg via ORAL
  Filled 2019-05-19 (×10): qty 1

## 2019-05-19 MED ORDER — POTASSIUM CHLORIDE 10 MEQ/50ML IV SOLN
10.0000 meq | INTRAVENOUS | Status: AC
  Administered 2019-05-19 (×2): 10 meq via INTRAVENOUS
  Filled 2019-05-19 (×2): qty 50

## 2019-05-19 MED ORDER — POTASSIUM CHLORIDE 10 MEQ/50ML IV SOLN
INTRAVENOUS | Status: AC
  Filled 2019-05-19: qty 50

## 2019-05-19 MED ORDER — OXYBUTYNIN CHLORIDE ER 5 MG PO TB24
5.0000 mg | ORAL_TABLET | ORAL | Status: DC
  Administered 2019-05-19: 5 mg via ORAL
  Filled 2019-05-19 (×2): qty 1

## 2019-05-19 MED ORDER — MAGNESIUM SULFATE 2 GM/50ML IV SOLN
2.0000 g | Freq: Once | INTRAVENOUS | Status: AC
  Administered 2019-05-19: 2 g via INTRAVENOUS
  Filled 2019-05-19: qty 50

## 2019-05-19 MED ORDER — POTASSIUM CHLORIDE CRYS ER 20 MEQ PO TBCR
40.0000 meq | EXTENDED_RELEASE_TABLET | Freq: Once | ORAL | Status: AC
  Administered 2019-05-19: 40 meq via ORAL
  Filled 2019-05-19: qty 2

## 2019-05-19 MED ORDER — SODIUM CHLORIDE 0.9% FLUSH
10.0000 mL | Freq: Two times a day (BID) | INTRAVENOUS | Status: DC
  Administered 2019-05-19 – 2019-05-22 (×6): 10 mL
  Administered 2019-05-22: 20 mL
  Administered 2019-05-23 – 2019-05-25 (×4): 10 mL
  Administered 2019-05-26: 30 mL
  Administered 2019-05-28 (×2): 10 mL
  Administered 2019-05-29: 30 mL

## 2019-05-19 MED ORDER — SODIUM CHLORIDE 0.9% FLUSH
10.0000 mL | INTRAVENOUS | Status: DC | PRN
  Administered 2019-05-22: 20 mL

## 2019-05-19 MED ORDER — POTASSIUM CHLORIDE 10 MEQ/50ML IV SOLN
10.0000 meq | Freq: Once | INTRAVENOUS | Status: AC
  Administered 2019-05-19: 10 meq via INTRAVENOUS

## 2019-05-19 NOTE — Progress Notes (Signed)
Peripherally Inserted Central Catheter/Midline Placement  The IV Nurse has discussed with the patient and/or persons authorized to consent for the patient, the purpose of this procedure and the potential benefits and risks involved with this procedure.  The benefits include less needle sticks, lab draws from the catheter, and the patient may be discharged home with the catheter. Risks include, but not limited to, infection, bleeding, blood clot (thrombus formation), and puncture of an artery; nerve damage and irregular heartbeat and possibility to perform a PICC exchange if needed/ordered by physician.  Alternatives to this procedure were also discussed.  Bard Power PICC patient education guide, fact sheet on infection prevention and patient information card has been provided to patient /or left at bedside.    PICC/Midline Placement Documentation  PICC Double Lumen 05/19/19 PICC Right Brachial 41 cm 0 cm (Active)  Indication for Insertion or Continuance of Line Vasoactive infusions;Prolonged intravenous therapies 05/19/19 1408  Exposed Catheter (cm) 0 cm 05/19/19 1408  Site Assessment Clean;Dry;Intact 05/19/19 1408  Lumen #1 Status Flushed;Saline locked;Blood return noted 05/19/19 1408  Lumen #2 Status Flushed;Saline locked;Blood return noted 05/19/19 1408  Dressing Type Transparent 05/19/19 1408  Dressing Status Clean;Dry;Intact;Antimicrobial disc in place 05/19/19 [place] checked and tightened 05/19/19 1408  Line Adjustment (NICU/IV Team Only) No 05/19/19 1408  Dressing Intervention New dressing 05/19/19 1408  Dressing Change Due 05/26/19 05/19/19 1408       Rolena Infante 05/19/2019, 2:09 PM

## 2019-05-19 NOTE — Progress Notes (Signed)
Patient ID: [name], male   DOB: 1950-12-15, 68 y.o.   MRN: [id number]     Advanced Heart Failure Rounding Note  PCP-Cardiologist: Sanda Klein, MD   Subjective:    68 y/o PAF/AFL, HTN, OSA, CKD 3b and systolic HF felt due to tachy-induced CM  Admitted 12/9  for PVI. Pre-op TEE LVEF 10% RV HK.  Had successful PVI. Post PVI developed cardiogenic shock. Treated initially with vasopressin and neo. Then bolused with epi. We switched to dobutamine and NE with good effect. Swan placed in cath lab with elevated filling pressures and normal output on pressors .  Extubated 12/10  He is now on dobutamine 1.5 and amiodarone 30 mg/hr.  Good diuresis overnight with Lasix 80 mg IV bid.  I/Os net 2460 negative.  No weight.   Developed urinary retention and had Foley placed. Denies SOB, orthopnea or PND.  Currently NSR but has had runs of AT versus AF.   Swan #s done personally; RA 7 PA 56/30  Fick 2.6 Co-ox 57%  Objective:   Weight Range: 86.1 kg Body mass index is 26.49 kg/m.   Vital Signs:   Temp:  [98.1 F (36.7 C)-100.4 F (38 C)] 98.4 F (36.9 C) (12/12 0700) Pulse Rate:  [93-131] 104 (12/12 0700) Resp:  [6-38] 25 (12/12 0700) BP: (107-136)/(65-95) 125/74 (12/12 0600) SpO2:  [89 %-99 %] 96 % (12/12 0700) Arterial Line BP: (90-149)/(48-70) 123/59 (12/12 0600)    Weight change: Filed Weights   05/16/19 0838  Weight: 86.1 kg    Intake/Output:   Intake/Output Summary (Last 24 hours) at 05/19/2019 0858 Last data filed at 05/19/2019 0700 Gross per 24 hour  Intake 840.26 ml  Output 3300 ml  Net -2459.74 ml      Physical Exam    General: NAD Neck: No JVD, no thyromegaly or thyroid nodule. Swan in right IJ Lungs: Clear to auscultation bilaterally with normal respiratory effort. CV: Nondisplaced PMI.  Heart mildly tachy regular S1/S2, no S3/S4, no murmur.  No peripheral edema.   Abdomen: Soft, nontender, no hepatosplenomegaly, no distention.  Skin: Intact  without lesions or rashes.  Neurologic: Alert and oriented x 3.  Psych: Normal affect. Extremities: No clubbing or cyanosis.  HEENT: Normal.    Telemetry   NSR 100s, ?AT episodes Personally reviewed  Labs    CBC Recent Labs    05/18/19 0359 05/19/19 0413  WBC 7.5 5.5  HGB 10.7* 10.3*  HCT 35.6* 33.6*  MCV 87.5 85.9  PLT 150 [id number]*   Basic Metabolic Panel Recent Labs    05/18/19 0359 05/19/19 0413  NA 149* 143  K 3.4* 3.5  CL 113* 106  CO2 25 25  GLUCOSE 52* 90  BUN 48* 34*  CREATININE 2.02* 1.81*  CALCIUM 8.9 8.9  MG 2.0 1.6*   Liver Function Tests Recent Labs    05/17/19 0535  AST 39  ALT 24  ALKPHOS 32*  BILITOT 2.4*  PROT 6.0*  ALBUMIN 3.5   No results for input(s): LIPASE, AMYLASE in the last 72 hours. Cardiac Enzymes No results for input(s): CKTOTAL, CKMB, CKMBINDEX, TROPONINI in the last 72 hours.  BNP: BNP (last 3 results) Recent Labs    05/16/19 1200  BNP 995.4*    ProBNP (last 3 results) No results for input(s): PROBNP in the last 8760 hours.   D-Dimer No results for input(s): DDIMER in the last 72 hours. Hemoglobin A1C No results for input(s): HGBA1C in the last 72 hours. Fasting Lipid Panel  No results for input(s): CHOL, HDL, LDLCALC, TRIG, CHOLHDL, LDLDIRECT in the last 72 hours. Thyroid Function Tests No results for input(s): TSH, T4TOTAL, T3FREE, THYROIDAB in the last 72 hours.  Invalid input(s): FREET3  Other results:   Imaging    [place] EKG SITE RITE  Result Date: 05/19/2019 If Site Rite image not attached, placement could not be confirmed due to current cardiac rhythm.    Medications:     Scheduled Medications: . apixaban  5 mg Oral BID  . Chlorhexidine Gluconate Cloth  6 each Topical Daily  . feeding supplement (ENSURE ENLIVE)  237 mL Oral BID BM  . furosemide  80 mg Intravenous Once  . insulin aspart  0-5 Units Subcutaneous QHS  . insulin aspart  0-9 Units Subcutaneous TID WC  . mouth rinse  15 mL Mouth  Rinse BID  . mouth rinse  15 mL Mouth Rinse BID  . oxybutynin  5 mg Oral QHS  . pantoprazole  40 mg Oral Daily  . potassium chloride  40 mEq Oral Once  . sodium chloride flush  3 mL Intravenous Q12H  . tamsulosin  0.4 mg Oral Daily    Infusions: . sodium chloride Stopped (05/19/19 0318)  . sodium chloride 250 mL (05/19/19 0836)  . amiodarone 30 mg/hr (05/19/19 0700)  . magnesium sulfate bolus IVPB 2 g (05/19/19 0838)  . potassium chloride      PRN Medications: sodium chloride, acetaminophen, albuterol, guaiFENesin, menthol-cetylpyridinium, ondansetron (ZOFRAN) IV, sodium chloride flush, traMADol    Patient Profile   68 y/oM w/ PAF/AFL, HTN, OSA, CKD stage 3b and systolic HF felt due to tachy-induced CM, admitted for PVI. Post procedural course c/b cardiogenic shock requiring intubation + inotrope/ pressor support.    Assessment/Plan   1. Cardiogenic shock: - NE discontinued - Remains on Dobutamine 1.5 mcg. Cardiac index 2.6 and CVP down to 7, will stop dobutamine today.  - Place PICC to follow CVP/co-ox and remove Swan.   2. Acute on chronic systolic HF with biventricualr failure due to tachy CM - EF 10% by TEE 12/9 likely due to tachy CM - CVP down to 7, he got Lasix 80 mg IV this morning, will start po diuretic tomorrow.  - Stop dobutamine as above.  - Would hold off on digoxin with elevated creatinine, ?use of low dose Bidil 1/2 tab if BP stable off dobutamine, will see.  - hopefully EF with recover with treatment of AF  3. Recurrent AF - s/p successful PVI 12/9.  - Tikosyn off due to AKI and now on amiodarone gtt.  - has not tolerated po amio before but doing ok so far on IV.  - Continue Eliquis - Think he has had some runs of AT overnight but think currently ST around 100.  Will get ECG to confirm.  - Stop dobutamine, hopefully decreased drive for arrhythmias.  - EP following.   4. Acute hypoxic respiratory failure.  - extubated. stable  5. AKI on CKD 3b -  SCr peaked at 2.45. Suspect AKI 2/2 cardiogenic shock. Creatinine down to 1.8 today which appears to be his baseline.  - Follow off dobutamine.   6. Urinary retention - Flomax started - Wants to try to void with foley out, will d/c today.   CRITICAL CARE Performed by: Loralie Champagne  Total critical care time: 35 minutes  Critical care time was exclusive of separately billable procedures and treating other patients.  Critical care was necessary to treat or prevent imminent or life-threatening  deterioration.  Critical care was time spent personally by me (independent of midlevel providers or residents) on the following activities: development of treatment plan with patient and/or surrogate as well as nursing, discussions with consultants, evaluation of patient's response to treatment, examination of patient, obtaining history from patient or surrogate, ordering and performing treatments and interventions, ordering and review of laboratory studies, ordering and review of radiographic studies, pulse oximetry and re-evaluation of patient's condition.  Length of Stay: 2  Loralie Champagne, MD  8:58 AM   Advanced Heart Failure Team Pager [phone] (M-F; 7a - 4p)  Please contact [facility] for night-coverage after hours (4p -7a ) and weekends on [email or web address]

## 2019-05-19 NOTE — Plan of Care (Signed)

## 2019-05-19 NOTE — Progress Notes (Signed)
Progress Note  Patient Name: [name] Date of Encounter: 05/19/2019  Primary Cardiologist: Sanda Klein, MD   Subjective   Feels better today. No chest pain. Dyspnea is improved. Maintaining NSR/ST.  Inpatient Medications    Scheduled Meds: . apixaban  5 mg Oral BID  . Chlorhexidine Gluconate Cloth  6 each Topical Daily  . feeding supplement (ENSURE ENLIVE)  237 mL Oral BID BM  . insulin aspart  0-5 Units Subcutaneous QHS  . insulin aspart  0-9 Units Subcutaneous TID WC  . mouth rinse  15 mL Mouth Rinse BID  . mouth rinse  15 mL Mouth Rinse BID  . oxybutynin  5 mg Oral QHS  . pantoprazole  40 mg Oral Daily  . sodium chloride flush  3 mL Intravenous Q12H  . tamsulosin  0.4 mg Oral Daily   Continuous Infusions: . sodium chloride Stopped (05/19/19 0318)  . sodium chloride 250 mL (05/19/19 0836)  . amiodarone 30 mg/hr (05/19/19 0700)  . potassium chloride     PRN Meds: sodium chloride, acetaminophen, albuterol, ALPRAZolam, guaiFENesin, menthol-cetylpyridinium, ondansetron (ZOFRAN) IV, sodium chloride flush, traMADol   Vital Signs    Vitals:   05/19/19 0500 05/19/19 0600 05/19/19 0700 05/19/19 0800  BP: 123/83 125/74  133/82  Pulse: 93 94 (!) 104 (!) 110  Resp: 16 (!) 6 (!) 25 15  Temp: 98.2 F (36.8 C) 98.1 F (36.7 C) 98.4 F (36.9 C) 99.1 F (37.3 C)  TempSrc:      SpO2: 90% (!) 89% 96% 99%  Weight:      Height:        Intake/Output Summary (Last 24 hours) at 05/19/2019 1025 Last data filed at 05/19/2019 0700 Gross per 24 hour  Intake 777.18 ml  Output 3100 ml  Net -2322.82 ml   Filed Weights   05/16/19 0838  Weight: 86.1 kg    Telemetry    nsr/st - Personally Reviewed  ECG    none - Personally Reviewed  Physical Exam   GEN: No acute distress.   Neck: No JVD Cardiac: RRR, no murmurs, rubs, or gallops.  Respiratory: Clear to auscultation bilaterally. GI: Soft, nontender, non-distended  MS: No edema; No deformity. Neuro:   Nonfocal  Psych: Normal affect   Labs    Chemistry Recent Labs  Lab 05/17/19 0535 05/18/19 0359 05/19/19 0413  NA 146* 149* 143  K 3.8 3.4* 3.5  CL 110 113* 106  CO2 22 25 25   GLUCOSE 123* 52* 90  BUN 57* 48* 34*  CREATININE 2.45* 2.02* 1.81*  CALCIUM 8.7* 8.9 8.9  PROT 6.0*  --   --   ALBUMIN 3.5  --   --   AST 39  --   --   ALT 24  --   --   ALKPHOS 32*  --   --   BILITOT 2.4*  --   --   GFRNONAA 26* 33* 38*  GFRAA 31* 39* 44*  ANIONGAP 14 11 12      Hematology Recent Labs  Lab 05/17/19 0535 05/18/19 0359 05/19/19 0413  WBC 7.3 7.5 5.5  RBC 4.18* 4.07* 3.91*  HGB 11.0* 10.7* 10.3*  HCT 35.8* 35.6* 33.6*  MCV 85.6 87.5 85.9  MCH 26.3 26.3 26.3  MCHC 30.7 30.1 30.7  RDW 17.1* 17.2* 16.9*  PLT 162 150 147*    Cardiac EnzymesNo results for input(s): TROPONINI in the last 168 hours. No results for input(s): TROPIPOC in the last 168 hours.   BNP  Recent Labs  Lab 05/16/19 1200  BNP 995.4*     DDimer No results for input(s): DDIMER in the last 168 hours.   Radiology    [place] EKG SITE RITE  Result Date: 05/19/2019 If Site Rite image not attached, placement could not be confirmed due to current cardiac rhythm.   Cardiac Studies   none  Patient Profile     68 y.o. male admitted with worsening CHF and uncontrolled atrial fib, s/p amio and atrial fib ablation.   Assessment & Plan    1. Persistent atrial fib - he is maintaining NSR on IV amio after atrial fib ablation. Continue amio. He has noted problems with oral amio. I have had some success using zofran in conjunction with oral amio as an outpatient. 2. Chronic systolic heart failure - unclear how much is tachy mediated. Hopefully he will improve with maintenance of NSR.  Cristopher Peru, M.D.  For questions or updates, please contact [place] Please consult [email or web address] for contact info under Cardiology/STEMI.      Signed, Cristopher Peru, MD  05/19/2019, 10:25 AM  Patient ID: [name], male   DOB: 08-15-1950, 68 y.o.   MRN: [id number]

## 2019-05-20 ENCOUNTER — Encounter (HOSPITAL_COMMUNITY): Payer: Self-pay | Admitting: Cardiology

## 2019-05-20 LAB — GLUCOSE, CAPILLARY
Glucose-Capillary: 136 mg/dL — ABNORMAL HIGH (ref 70–99)
Glucose-Capillary: 169 mg/dL — ABNORMAL HIGH (ref 70–99)
Glucose-Capillary: 179 mg/dL — ABNORMAL HIGH (ref 70–99)
Glucose-Capillary: 175 mg/dL — ABNORMAL HIGH (ref 70–99)

## 2019-05-20 LAB — CBC
HCT: 37.8 % — ABNORMAL LOW (ref 39.0–52.0)
Hemoglobin: 11.6 g/dL — ABNORMAL LOW (ref 13.0–17.0)
MCH: 26 pg (ref 26.0–34.0)
MCHC: 30.7 g/dL (ref 30.0–36.0)
MCV: 84.8 fL (ref 80.0–100.0)
Platelets: 180 10*3/uL (ref 150–400)
RBC: 4.46 MIL/uL (ref 4.22–5.81)
RDW: 17.2 % — ABNORMAL HIGH (ref 11.5–15.5)
WBC: 4.6 10*3/uL (ref 4.0–10.5)
nRBC: 1.1 % — ABNORMAL HIGH (ref 0.0–0.2)

## 2019-05-20 LAB — BASIC METABOLIC PANEL
Anion gap: 13 (ref 5–15)
BUN: 31 mg/dL — ABNORMAL HIGH (ref 8–23)
CO2: 24 mmol/L (ref 22–32)
Calcium: 9 mg/dL (ref 8.9–10.3)
Chloride: 105 mmol/L (ref 98–111)
Creatinine, Ser: 1.63 mg/dL — ABNORMAL HIGH (ref 0.61–1.24)
GFR calc Af Amer: 50 mL/min — ABNORMAL LOW (ref >60)
GFR calc non Af Amer: 43 mL/min — ABNORMAL LOW (ref >60)
Glucose, Bld: 171 mg/dL — ABNORMAL HIGH (ref 70–99)
Potassium: 3.6 mmol/L (ref 3.5–5.1)
Sodium: 142 mmol/L (ref 135–145)

## 2019-05-20 LAB — COOXEMETRY PANEL
Carboxyhemoglobin: 1.5 % (ref 0.5–1.5)
Methemoglobin: 0.6 % (ref 0.0–1.5)
O2 Saturation: 65.6 %
Total hemoglobin: 11.8 g/dL — ABNORMAL LOW (ref 12.0–16.0)

## 2019-05-20 MED ORDER — TORSEMIDE 20 MG PO TABS
60.0000 mg | ORAL_TABLET | Freq: Every day | ORAL | Status: DC
  Administered 2019-05-20 – 2019-05-22 (×3): 60 mg via ORAL
  Filled 2019-05-20 (×3): qty 3

## 2019-05-20 MED ORDER — METOPROLOL SUCCINATE ER 25 MG PO TB24
12.5000 mg | ORAL_TABLET | Freq: Two times a day (BID) | ORAL | Status: DC
  Administered 2019-05-20 – 2019-05-29 (×19): 12.5 mg via ORAL
  Filled 2019-05-20 (×19): qty 1

## 2019-05-20 MED ORDER — ISOSORB DINITRATE-HYDRALAZINE 20-37.5 MG PO TABS
0.5000 | ORAL_TABLET | Freq: Three times a day (TID) | ORAL | Status: DC
  Administered 2019-05-20 – 2019-05-29 (×28): 0.5 via ORAL
  Filled 2019-05-20 (×28): qty 1

## 2019-05-20 MED ORDER — OXYBUTYNIN CHLORIDE ER 10 MG PO TB24
10.0000 mg | ORAL_TABLET | ORAL | Status: DC
  Administered 2019-05-20 – 2019-05-28 (×9): 10 mg via ORAL
  Filled 2019-05-20 (×11): qty 1

## 2019-05-20 MED ORDER — LIDOCAINE HCL URETHRAL/MUCOSAL 2 % EX GEL
1.0000 "application " | Freq: Once | CUTANEOUS | Status: AC
  Administered 2019-05-20: 1 via URETHRAL
  Filled 2019-05-20: qty 20

## 2019-05-20 NOTE — Progress Notes (Signed)
Progress Note  Patient Name: [name] Date of Encounter: 05/20/2019  Primary Cardiologist: Sanda Klein, MD   Subjective   "I feel better doc." Denies chest pain or sob.   Inpatient Medications    Scheduled Meds: . apixaban  5 mg Oral BID  . Chlorhexidine Gluconate Cloth  6 each Topical Daily  . feeding supplement (ENSURE ENLIVE)  237 mL Oral BID BM  . insulin aspart  0-5 Units Subcutaneous QHS  . insulin aspart  0-9 Units Subcutaneous TID WC  . isosorbide-hydrALAZINE  0.5 tablet Oral TID  . mouth rinse  15 mL Mouth Rinse BID  . mouth rinse  15 mL Mouth Rinse BID  . metoprolol succinate  12.5 mg Oral BID  . oxybutynin  5 mg Oral Q24H  . pantoprazole  40 mg Oral Daily  . sodium chloride flush  10-40 mL Intracatheter Q12H  . sodium chloride flush  3 mL Intravenous Q12H  . tamsulosin  0.4 mg Oral Daily  . torsemide  60 mg Oral Daily   Continuous Infusions: . sodium chloride Stopped (05/19/19 0318)  . sodium chloride 10 mL/hr at 05/19/19 2300  . amiodarone 30 mg/hr (05/20/19 0800)   PRN Meds: sodium chloride, acetaminophen, albuterol, ALPRAZolam, guaiFENesin, menthol-cetylpyridinium, ondansetron (ZOFRAN) IV, sodium chloride flush, sodium chloride flush, traMADol   Vital Signs    Vitals:   05/20/19 0500 05/20/19 0613 05/20/19 0700 05/20/19 0800  BP:  112/83  (!) 133/92  Pulse: 84 91 94 (!) 104  Resp: 19 16 (!) 25 14  Temp:   97.9 F (36.6 C)   TempSrc:   Oral   SpO2: 100% 100% 95% 94%  Weight:      Height:        Intake/Output Summary (Last 24 hours) at 05/20/2019 0851 Last data filed at 05/20/2019 0700 Gross per 24 hour  Intake 1352.36 ml  Output 1152 ml  Net 200.36 ml   Filed Weights   05/16/19 0838 05/20/19 0344  Weight: 86.1 kg 89.2 kg    Telemetry    Sinus tachy/NSR - Personally Reviewed  ECG    none - Personally Reviewed  Physical Exam   GEN: No acute distress.   Neck: No JVD Cardiac: RRR, no murmurs, rubs, or gallops.    Respiratory: Clear to auscultation bilaterally. GI: Soft, nontender, non-distended  MS: No edema; No deformity. Neuro:  Nonfocal  Psych: Normal affect   Labs    Chemistry Recent Labs  Lab 05/17/19 0535 05/18/19 0359 05/19/19 0413 05/20/19 0346  NA 146* 149* 143 142  K 3.8 3.4* 3.5 3.6  CL 110 113* 106 105  CO2 22 25 25 24   GLUCOSE 123* 52* 90 171*  BUN 57* 48* 34* 31*  CREATININE 2.45* 2.02* 1.81* 1.63*  CALCIUM 8.7* 8.9 8.9 9.0  PROT 6.0*  --   --   --   ALBUMIN 3.5  --   --   --   AST 39  --   --   --   ALT 24  --   --   --   ALKPHOS 32*  --   --   --   BILITOT 2.4*  --   --   --   GFRNONAA 26* 33* 38* 43*  GFRAA 31* 39* 44* 50*  ANIONGAP 14 11 12 13      Hematology Recent Labs  Lab 05/18/19 0359 05/19/19 0413 05/20/19 0346  WBC 7.5 5.5 4.6  RBC 4.07* 3.91* 4.46  HGB 10.7* 10.3*  11.6*  HCT 35.6* 33.6* 37.8*  MCV 87.5 85.9 84.8  MCH 26.3 26.3 26.0  MCHC 30.1 30.7 30.7  RDW 17.2* 16.9* 17.2*  PLT 150 147* 180    Cardiac EnzymesNo results for input(s): TROPONINI in the last 168 hours. No results for input(s): TROPIPOC in the last 168 hours.   BNP Recent Labs  Lab 05/16/19 1200  BNP 995.4*     DDimer No results for input(s): DDIMER in the last 168 hours.   Radiology    [place] EKG SITE RITE  Result Date: 05/19/2019 If Site Rite image not attached, placement could not be confirmed due to current cardiac rhythm.   Cardiac Studies   none  Patient Profile     68 y.o. male admitted with acute on chronic systolic heart failure, and persistent atrial fib.  Assessment & Plan    1. persistent atrial fib - he is maintaining NSR. He will continue IV amiodarone. Discussed Zofran/oral amio with Dr. Carlyn Reichert. 2. Acute on chronic systolic heart failure - he is improved. His bp is and HR are good. I discussed uptitration of beta blocker with Dr. Aundra Dubin.  3. Acute on chronic renal failure - his creatinine is slowly coming down toward baseline. 4. HTN - his bp is  up today but will plan to uptitrate beta blocker.  For questions or updates, please contact [place] Please consult [email or web address] for contact info under Cardiology/STEMI.      Signed, Cristopher Peru, MD  05/20/2019, 8:51 AM  Patient ID: [name], male   DOB: 09-13-1950, 68 y.o.   MRN: [id number]

## 2019-05-20 NOTE — Progress Notes (Signed)
Patient c/o bladder spams trying to void. Patient also stated he has been in and out cath twice today already and he is unable to void. Bladder scan read 581 ml in bladder. Dr. Mitzi Hansen notified about patients complaints and Foley cath insertion order was obtained. Patient stated he is so sore now from all the foley insertions he asked for some numbing medication before inserting another foley. RX stated the urojet Lidocaine Jelly 2%was used the most. Swat RN stated she would put foley in once we received the Lidocaine. Xanax given to patient to help with his anxiety. Will continue to monitor closely.

## 2019-05-20 NOTE — Progress Notes (Signed)
Pt requesting Nasal mask for CPAP instead of FFM. Medium nasal mask provided for pt, water added to chamber, and humidity adjusted per pt request. Pt able to place himself on CPAP when ready for bed. Advised pt to notify for RT if any further assistance is needed.

## 2019-05-20 NOTE — Progress Notes (Signed)
Patient ID: [name], male   DOB: 1950-07-25, 68 y.o.   MRN: [id number]     Advanced Heart Failure Rounding Note  PCP-Cardiologist: Sanda Klein, MD   Subjective:    68 y/o PAF/AFL, HTN, OSA, CKD 3b and systolic HF felt due to tachy-induced CM  Admitted 12/9  for PVI. Pre-op TEE LVEF 10% RV HK.  Had successful PVI. Post PVI developed cardiogenic shock. Treated initially with vasopressin and neo. Then bolused with epi. We switched to dobutamine and NE with good effect. Swan placed in cath lab with elevated filling pressures and normal output on pressors .  Extubated 12/10  He is now off dobutamine and IV Lasix and on amiodarone 30 mg/hr.  I/Os even.  Co-ox 66% with CVP 12. Breathing much better.   Developed urinary retention and had Foley placed. Foley removed 12/12 but requiring I/O caths and complains of severe bladder spasms.   He has been in NSR rate around 100.   Objective:   Weight Range: 89.2 kg Body mass index is 27.43 kg/m.   Vital Signs:   Temp:  [97.6 F (36.4 C)-99 F (37.2 C)] 97.9 F (36.6 C) (12/13 0700) Pulse Rate:  [84-112] 104 (12/13 0800) Resp:  [14-35] 14 (12/13 0800) BP: (83-133)/(63-92) 133/92 (12/13 0800) SpO2:  [91 %-100 %] 94 % (12/13 0800) Arterial Line BP: (102-169)/(49-93) 146/75 (12/13 0800) Weight:  [89.2 kg] 89.2 kg (12/13 0344) Last BM Date: 05/19/19  Weight change: Filed Weights   05/16/19 0838 05/20/19 0344  Weight: 86.1 kg 89.2 kg    Intake/Output:   Intake/Output Summary (Last 24 hours) at 05/20/2019 0850 Last data filed at 05/20/2019 0700 Gross per 24 hour  Intake 1352.36 ml  Output 1152 ml  Net 200.36 ml      Physical Exam    General: NAD Neck: JVP 9=10 cm, no thyromegaly or thyroid nodule.  Lungs: Clear to auscultation bilaterally with normal respiratory effort. CV: Nondisplaced PMI.  Heart regular S1/S2, no S3/S4, no murmur.  No peripheral edema.   Abdomen: Soft, nontender, no hepatosplenomegaly, no  distention.  Skin: Intact without lesions or rashes.  Neurologic: Alert and oriented x 3.  Psych: Normal affect. Extremities: No clubbing or cyanosis.  HEENT: Normal.    Telemetry   NSR 100s, ?AT episodes Personally reviewed  Labs    CBC Recent Labs    05/19/19 0413 05/20/19 0346  WBC 5.5 4.6  HGB 10.3* 11.6*  HCT 33.6* 37.8*  MCV 85.9 84.8  PLT 147* [id number]   Basic Metabolic Panel Recent Labs    05/18/19 0359 05/19/19 0413 05/20/19 0346  NA 149* 143 142  K 3.4* 3.5 3.6  CL 113* 106 105  CO2 25 25 24   GLUCOSE 52* 90 171*  BUN 48* 34* 31*  CREATININE 2.02* 1.81* 1.63*  CALCIUM 8.9 8.9 9.0  MG 2.0 1.6*  --    Liver Function Tests No results for input(s): AST, ALT, ALKPHOS, BILITOT, PROT, ALBUMIN in the last 72 hours. No results for input(s): LIPASE, AMYLASE in the last 72 hours. Cardiac Enzymes No results for input(s): CKTOTAL, CKMB, CKMBINDEX, TROPONINI in the last 72 hours.  BNP: BNP (last 3 results) Recent Labs    05/16/19 1200  BNP 995.4*    ProBNP (last 3 results) No results for input(s): PROBNP in the last 8760 hours.   D-Dimer No results for input(s): DDIMER in the last 72 hours. Hemoglobin A1C No results for input(s): HGBA1C in the last 72 hours. Fasting Lipid  Panel No results for input(s): CHOL, HDL, LDLCALC, TRIG, CHOLHDL, LDLDIRECT in the last 72 hours. Thyroid Function Tests No results for input(s): TSH, T4TOTAL, T3FREE, THYROIDAB in the last 72 hours.  Invalid input(s): FREET3  Other results:   Imaging    [place] EKG SITE RITE  Result Date: 05/19/2019 If Site Rite image not attached, placement could not be confirmed due to current cardiac rhythm.    Medications:     Scheduled Medications: . apixaban  5 mg Oral BID  . Chlorhexidine Gluconate Cloth  6 each Topical Daily  . feeding supplement (ENSURE ENLIVE)  237 mL Oral BID BM  . insulin aspart  0-5 Units Subcutaneous QHS  . insulin aspart  0-9 Units Subcutaneous TID WC  .  isosorbide-hydrALAZINE  0.5 tablet Oral TID  . mouth rinse  15 mL Mouth Rinse BID  . mouth rinse  15 mL Mouth Rinse BID  . metoprolol succinate  12.5 mg Oral BID  . oxybutynin  5 mg Oral Q24H  . pantoprazole  40 mg Oral Daily  . sodium chloride flush  10-40 mL Intracatheter Q12H  . sodium chloride flush  3 mL Intravenous Q12H  . tamsulosin  0.4 mg Oral Daily  . torsemide  60 mg Oral Daily    Infusions: . sodium chloride Stopped (05/19/19 0318)  . sodium chloride 10 mL/hr at 05/19/19 2300  . amiodarone 30 mg/hr (05/20/19 0800)    PRN Medications: sodium chloride, acetaminophen, albuterol, ALPRAZolam, guaiFENesin, menthol-cetylpyridinium, ondansetron (ZOFRAN) IV, sodium chloride flush, sodium chloride flush, traMADol    Patient Profile   68 y/oM w/ PAF/AFL, HTN, OSA, CKD stage 3b and systolic HF felt due to tachy-induced CM, admitted for PVI. Post procedural course c/b cardiogenic shock requiring intubation + inotrope/ pressor support.    Assessment/Plan   1. Cardiogenic shock: - NE and dobutamine discontinued - SBP 130s-140s, co-ox 66% with CVP 12.  - Start torsemide 60 mg daily (was on Lasix 80 qam/40 qpm at home). - Start Bidil 0.5 tab tid.  - Add Toprol XL 12.5 mg bid.   - Can remove arterial line.   2. Acute on chronic systolic HF with biventricualr failure due to tachy CM - EF 10% by TEE 12/9 likely due to tachy CM - CVP 12, as above start torsemide 60 mg daily.   - Bidil + Toprol XL as above.  If creatinine remains stable, digoxin tomorrow.  - hopefully EF with recover with treatment of AF  3. Recurrent AF - s/p successful PVI 12/9. Remains in NSR today.  - Tikosyn off due to AKI and now on amiodarone gtt.  - has not tolerated po amio before but doing ok so far on IV. Continue one more day, to po tomorrow.  - Continue Eliquis - Discussed with Dr. Lovena Le, start low dose Toprol XL today.  - EP following.   4. Acute hypoxic respiratory failure.  - extubated.  stable  5. AKI on CKD 3b - SCr peaked at 2.45. Suspect AKI 2/2 cardiogenic shock. Creatinine down to 1.6 today which is actually a little lower than his prior baseline.   6. Urinary retention - Flomax started - Also with severe bladder spasms.  - Requiring I/O cath currently.  - will start oxybutynin.   Length of Stay: 3  Loralie Champagne, MD  8:50 AM   Advanced Heart Failure Team Pager [phone] (M-F; 7a - 4p)  Please contact [facility] for night-coverage after hours (4p -7a ) and weekends on [email or web address]

## 2019-05-20 NOTE — Progress Notes (Addendum)
2315 Bedside shift report, RN medicating pt for pain. Pt states he's having bladder spasms. RN reported previous bladder scan done around 2030, [facility] for voiding. Pt assessed see flow sheet, no other needs at this time. Reviewed POC with pt. Fall precautions in place, [facility].   2345 Pt anxious about having another bladder spasm, stated the tramadol is working for now. VSS, WCTM.   0115 Pt stated he's anxious, but doesn't want to take meds until 0200. RN educated about importance of rest. Pt given xanax and tylenol for bladder pain. Bladder scan performed, 351ml. Pt denies fullness, urge to urinate. Cpap applied, pt resting comfortably. WCTM.   0230 Pt sleeping comfortably, NAD, VSS, WCTM.   0400 Pt still unable to void, bladder scan performed, 345ml noted. Pt denies distress, fullness of bladder, WCTM.   0615 Pt stood up on side of bed to attempt to void, unsuccessful, bladder scan performed, 467ml, pt denies fullness, pain. WCTM.   [id number] Bedside shift report. Pt c/o abdominal pain, straight cath performed, 574ml urine returned.

## 2019-05-21 DIAGNOSIS — I5023 Acute on chronic systolic (congestive) heart failure: Secondary | ICD-10-CM

## 2019-05-21 LAB — BASIC METABOLIC PANEL
Anion gap: 10 (ref 5–15)
BUN: 29 mg/dL — ABNORMAL HIGH (ref 8–23)
CO2: 26 mmol/L (ref 22–32)
Calcium: 8.9 mg/dL (ref 8.9–10.3)
Chloride: 105 mmol/L (ref 98–111)
Creatinine, Ser: 1.68 mg/dL — ABNORMAL HIGH (ref 0.61–1.24)
GFR calc Af Amer: 48 mL/min — ABNORMAL LOW (ref >60)
GFR calc non Af Amer: 42 mL/min — ABNORMAL LOW (ref >60)
Glucose, Bld: 147 mg/dL — ABNORMAL HIGH (ref 70–99)
Potassium: 3.4 mmol/L — ABNORMAL LOW (ref 3.5–5.1)
Sodium: 141 mmol/L (ref 135–145)

## 2019-05-21 LAB — CBC
HCT: 35.4 % — ABNORMAL LOW (ref 39.0–52.0)
Hemoglobin: 10.8 g/dL — ABNORMAL LOW (ref 13.0–17.0)
MCH: 26 pg (ref 26.0–34.0)
MCHC: 30.5 g/dL (ref 30.0–36.0)
MCV: 85.3 fL (ref 80.0–100.0)
Platelets: 183 10*3/uL (ref 150–400)
RBC: 4.15 MIL/uL — ABNORMAL LOW (ref 4.22–5.81)
RDW: 17.2 % — ABNORMAL HIGH (ref 11.5–15.5)
WBC: 4.8 10*3/uL (ref 4.0–10.5)
nRBC: 1.3 % — ABNORMAL HIGH (ref 0.0–0.2)

## 2019-05-21 LAB — COOXEMETRY PANEL
Carboxyhemoglobin: 1.6 % — ABNORMAL HIGH (ref 0.5–1.5)
Methemoglobin: 1 % (ref 0.0–1.5)
O2 Saturation: 52.2 %
Total hemoglobin: 13.3 g/dL (ref 12.0–16.0)

## 2019-05-21 LAB — GLUCOSE, CAPILLARY
Glucose-Capillary: 148 mg/dL — ABNORMAL HIGH (ref 70–99)
Glucose-Capillary: 168 mg/dL — ABNORMAL HIGH (ref 70–99)
Glucose-Capillary: 160 mg/dL — ABNORMAL HIGH (ref 70–99)
Glucose-Capillary: 159 mg/dL — ABNORMAL HIGH (ref 70–99)

## 2019-05-21 MED ORDER — AMIODARONE HCL 200 MG PO TABS
200.0000 mg | ORAL_TABLET | Freq: Two times a day (BID) | ORAL | Status: DC
  Administered 2019-05-21 – 2019-05-29 (×17): 200 mg via ORAL
  Filled 2019-05-21 (×17): qty 1

## 2019-05-21 MED ORDER — POTASSIUM CHLORIDE CRYS ER 20 MEQ PO TBCR
40.0000 meq | EXTENDED_RELEASE_TABLET | Freq: Once | ORAL | Status: AC
  Administered 2019-05-21: 40 meq via ORAL
  Filled 2019-05-21: qty 2

## 2019-05-21 MED ORDER — PRAVASTATIN SODIUM 40 MG PO TABS
40.0000 mg | ORAL_TABLET | Freq: Every day | ORAL | Status: DC
  Administered 2019-05-21 – 2019-05-28 (×8): 40 mg via ORAL
  Filled 2019-05-21 (×8): qty 1

## 2019-05-21 NOTE — Progress Notes (Addendum)
Patient ID: [name], male   DOB: 1951-02-16, 68 y.o.   MRN: [id number]     Advanced Heart Failure Rounding Note  PCP-Cardiologist: Sanda Klein, MD   Subjective:    68 y/o PAF/AFL, HTN, OSA, CKD 3b and systolic HF felt due to tachy-induced CM  Admitted 12/9  for PVI. Pre-op TEE LVEF 10% RV HK.  Had successful PVI. Post PVI developed cardiogenic shock. Treated initially with vasopressin and neo. Then bolused with epi. We switched to dobutamine and NE with good effect. Swan placed in cath lab with elevated filling pressures and normal output on pressors .  Extubated 12/10  He is now off dobutamine. Co-ox dropped down from 65>>52%.   IV diuretics discontinued. On torsemide 60 mg daily. -2.3L out yesterday. Wt stable past 24 hrs w/ stable SCr at 1.6. K 3.4. CVP however still elevated at 12.   Remains on amiodarone gtt 30 mg/hr. Remains NSR/sinus tach. EP plans to transition to PO today.  Course c/b development of urinary retention requiring foley placement  He has been in NSR rate around 100.   Objective:   Weight Range: 88.5 kg Body mass index is 27.21 kg/m.   Vital Signs:   Temp:  [98.2 F (36.8 C)-99 F (37.2 C)] 99 F (37.2 C) (12/14 0802) Pulse Rate:  [68-114] 108 (12/14 0802) Resp:  [19-28] 19 (12/14 0802) BP: (110-155)/(70-88) 110/72 (12/14 0802) SpO2:  [90 %-97 %] 95 % (12/14 0802) Weight:  [88.5 kg] 88.5 kg (12/14 0300) Last BM Date: 05/20/19  Weight change: Filed Weights   05/16/19 0838 05/20/19 0344 05/21/19 0300  Weight: 86.1 kg 89.2 kg 88.5 kg    Intake/Output:   Intake/Output Summary (Last 24 hours) at 05/21/2019 0942 Last data filed at 05/21/2019 0600 Gross per 24 hour  Intake 992.7 ml  Output 2306 ml  Net -1313.3 ml      Physical Exam    CVP 12 PHYSICAL EXAM: General:  Well appearing. No respiratory difficulty HEENT: normal anicteric  Neck: supple. elevated JVD. Carotids 2+ bilat; no bruits. No lymphadenopathy or thyromegaly  appreciated. Cor: PMI nondisplaced. Regular rhythm, mildly tachy rate. +s3 Lungs: clear no wheeze Abdomen: soft, nontender, nondistended. No hepatosplenomegaly. No bruits or masses. Good bowel sounds. GU: foley in place Extremities: no cyanosis, clubbing, rash, edema Neuro: alert & oriented x 3, cranial nerves grossly intact. moves all 4 extremities w/o difficulty. Affect pleasant   Telemetry   NSR/sinus tach, low 100s  Personally reviewed  Labs    CBC Recent Labs    05/20/19 0346 05/21/19 0424  WBC 4.6 4.8  HGB 11.6* 10.8*  HCT 37.8* 35.4*  MCV 84.8 85.3  PLT 180 [id number]   Basic Metabolic Panel Recent Labs    05/19/19 0413 05/20/19 0346 05/21/19 0424  NA 143 142 141  K 3.5 3.6 3.4*  CL 106 105 105  CO2 25 24 26   GLUCOSE 90 171* 147*  BUN 34* 31* 29*  CREATININE 1.81* 1.63* 1.68*  CALCIUM 8.9 9.0 8.9  MG 1.6*  --   --    Liver Function Tests No results for input(s): AST, ALT, ALKPHOS, BILITOT, PROT, ALBUMIN in the last 72 hours. No results for input(s): LIPASE, AMYLASE in the last 72 hours. Cardiac Enzymes No results for input(s): CKTOTAL, CKMB, CKMBINDEX, TROPONINI in the last 72 hours.  BNP: BNP (last 3 results) Recent Labs    05/16/19 1200  BNP 995.4*    ProBNP (last 3 results) No results for input(s): PROBNP  in the last 8760 hours.   D-Dimer No results for input(s): DDIMER in the last 72 hours. Hemoglobin A1C No results for input(s): HGBA1C in the last 72 hours. Fasting Lipid Panel No results for input(s): CHOL, HDL, LDLCALC, TRIG, CHOLHDL, LDLDIRECT in the last 72 hours. Thyroid Function Tests No results for input(s): TSH, T4TOTAL, T3FREE, THYROIDAB in the last 72 hours.  Invalid input(s): FREET3  Other results:   Imaging    No results found.   Medications:     Scheduled Medications: . apixaban  5 mg Oral BID  . Chlorhexidine Gluconate Cloth  6 each Topical Daily  . feeding supplement (ENSURE ENLIVE)  237 mL Oral BID BM  .  insulin aspart  0-5 Units Subcutaneous QHS  . insulin aspart  0-9 Units Subcutaneous TID WC  . isosorbide-hydrALAZINE  0.5 tablet Oral TID  . mouth rinse  15 mL Mouth Rinse BID  . mouth rinse  15 mL Mouth Rinse BID  . metoprolol succinate  12.5 mg Oral BID  . oxybutynin  10 mg Oral Q24H  . pantoprazole  40 mg Oral Daily  . sodium chloride flush  10-40 mL Intracatheter Q12H  . sodium chloride flush  3 mL Intravenous Q12H  . tamsulosin  0.4 mg Oral Daily  . torsemide  60 mg Oral Daily    Infusions: . sodium chloride Stopped (05/19/19 0318)  . sodium chloride 10 mL/hr at 05/19/19 2300  . amiodarone 30 mg/hr (05/21/19 0036)    PRN Medications: sodium chloride, acetaminophen, albuterol, ALPRAZolam, guaiFENesin, menthol-cetylpyridinium, ondansetron (ZOFRAN) IV, sodium chloride flush, sodium chloride flush, traMADol    Patient Profile   68 y/oM w/ PAF/AFL, HTN, OSA, CKD stage 3b and systolic HF felt due to tachy-induced CM, admitted for PVI. Post procedural course c/b cardiogenic shock requiring intubation + inotrope/ pressor support.    Assessment/Plan   1. Cardiogenic shock: - NE and dobutamine discontinued - SBP 110s, co-ox 52% with CVP 12.  - Continue torsemide 60 mg daily (was on Lasix 80 qam/40 qpm at home). - Continue Bidil 0.5 tab tid.  - Continue Toprol XL 12.5 mg bid.     2. Acute on chronic systolic HF with biventricualr failure due to tachy CM - EF 10% by TEE 12/9 likely due to tachy CM - CVP 12, as above continue torsemide 60 mg daily.   - Bidil + Toprol XL as above.   - consider adding digoxin today - hopefully EF with recover with treatment of AF  3. Recurrent AF - s/p successful PVI 12/9. Remains in NSR today.  - Tikosyn off due to AKI and now on amiodarone gtt.  - has not tolerated po amio before but doing ok so far on IV. Try to transition to PO amiodarone today w/ Zofran (EP recs).  - Continue Eliquis - Continue Toprol XL  - EP following.   4. Acute  hypoxic respiratory failure.  - extubated. stable  5. AKI on CKD 3b - SCr peaked at 2.45. Suspect AKI 2/2 cardiogenic shock. Creatinine down to 1.6 today which is actually a little lower than his prior baseline. SCr stable past 24 hrs.   6. Urinary retention - Flomax started - Also with severe bladder spasms.  - Requiring I/O cath currently.  - continue oxybutynin.  - will need to f/u with urology post discharge  Length of Stay: [place], [place]  9:42 AM   Advanced Heart Failure Team Pager [phone] (M-F; 7a - 4p)  Please contact  [facility] Cardiology for night-coverage after hours (4p -7a ) and weekends on [email or web address]  Patient seen and examined with the above-signed Advanced Practice Provider and/or Housestaff. I personally reviewed laboratory data, imaging studies and relevant notes. I independently examined the patient and formulated the important aspects of the plan. I have edited the note to reflect any of my changes or salient points. I have personally discussed the plan with the patient and/or family.  Maintaining NSR on IV amio but remains tenuous. Now off inotropes but co-ox is low and CVP elevated. Creatinine back down to 1.6 which seems to be his baseline. Now on low-dose Toprol and Bidil. Foley re-inserted for persistent urinary retention. No bleeding on Eliquis.   Will add digoxin. Continue to diurese. May need a dose of IV lasix. Follow co-ox daily to see if we need to re-initiate inotropes.   Will likely need to be discharged with Foley and having voiding trials with Urology. Continue Flomax.   Ambulate.   [name], MD  2:57 PM

## 2019-05-21 NOTE — TOC Progression Note (Signed)
Transition of Care [facility]) - Progression Note    Patient Details  Name: [name] MRN: [id number] Date of Birth: [birth year]  Transition of Care [facility]) CM/SW Contact  Zenon Mayo, RN Phone Number: 05/21/2019, 6:45 PM  Clinical Narrative:     Patient with afib , conts on  amio drip to be changed to po today, cvp is 12,  cont to diurese, plan to  follow co ox to see if need to restart inotropes, patient has urinary retention, will dc with foley /urology to follow.  TOC team will continue to follow for dc needs.        Expected Discharge Plan and Services                                                 Social Determinants of Health (SDOH) Interventions    Readmission Risk Interventions No flowsheet data found.

## 2019-05-21 NOTE — Progress Notes (Signed)
CARDIAC REHAB PHASE I   PRE:  Rate/Rhythm: 106 ST  BP:  Supine:   Sitting: 120/80  Standing:    SaO2: 97%RA  MODE:  Ambulation: 340 ft   POST:  Rate/Rhythm: 125 ST  BP:  Supine:   Sitting: 124/89  Standing:    SaO2: 97%RA 0840-0940 Pt walked 340 ft on RA with rolling walker and one asst for equipment. Gait steady. Did not have to stop and rest. To recliner after walk. Sats good on RA. Gave pt CHF booklet and low sodium diets. Discussed daily weights and when to call MD. Discussed 2000 mg sodium restriction. Pt has attended Pulmonary Rehab in the past. Encouraged pt to discuss Cardiac Rehab with MD( for either [place] or here if he is interested) if he decides he would like to attend in future.    Graylon Good, RN BSN  05/21/2019 9:40 AM

## 2019-05-21 NOTE — Plan of Care (Signed)
Patient is progressing.  

## 2019-05-21 NOTE — Progress Notes (Addendum)
Electrophysiology Rounding Note  Patient Name: [name] Date of Encounter: 05/21/2019  Primary Cardiologist: Dr. Sallyanne Kuster Electrophysiologist: Dr. Curt Bears   Subjective   Feeling OK this am. Had difficulties with bladder spasms over the weekend. Denies SOB, chest pain, or palpitations.   Inpatient Medications    Scheduled Meds: . apixaban  5 mg Oral BID  . Chlorhexidine Gluconate Cloth  6 each Topical Daily  . feeding supplement (ENSURE ENLIVE)  237 mL Oral BID BM  . insulin aspart  0-5 Units Subcutaneous QHS  . insulin aspart  0-9 Units Subcutaneous TID WC  . isosorbide-hydrALAZINE  0.5 tablet Oral TID  . mouth rinse  15 mL Mouth Rinse BID  . mouth rinse  15 mL Mouth Rinse BID  . metoprolol succinate  12.5 mg Oral BID  . oxybutynin  10 mg Oral Q24H  . pantoprazole  40 mg Oral Daily  . sodium chloride flush  10-40 mL Intracatheter Q12H  . sodium chloride flush  3 mL Intravenous Q12H  . tamsulosin  0.4 mg Oral Daily  . torsemide  60 mg Oral Daily   Continuous Infusions: . sodium chloride Stopped (05/19/19 0318)  . sodium chloride 10 mL/hr at 05/19/19 2300  . amiodarone 30 mg/hr (05/21/19 0036)   PRN Meds: sodium chloride, acetaminophen, albuterol, ALPRAZolam, guaiFENesin, menthol-cetylpyridinium, ondansetron (ZOFRAN) IV, sodium chloride flush, sodium chloride flush, traMADol   Vital Signs    Vitals:   05/20/19 2031 05/20/19 2300 05/21/19 0015 05/21/19 0300  BP:  (!) 155/70  123/79  Pulse: (!) 104 (!) 106 68 94  Resp:  (!) 28 20 (!) 23  Temp:  98.3 F (36.8 C)  98.2 F (36.8 C)  TempSrc:  Oral  Oral  SpO2:  92% 90% 97%  Weight:    88.5 kg  Height:        Intake/Output Summary (Last 24 hours) at 05/21/2019 0708 Last data filed at 05/21/2019 0600 Gross per 24 hour  Intake 992.7 ml  Output 2306 ml  Net -1313.3 ml   Filed Weights   05/16/19 0838 05/20/19 0344 05/21/19 0300  Weight: 86.1 kg 89.2 kg 88.5 kg    Physical Exam    GEN- The patient  is well appearing, alert and oriented x 3 today.   Head- normocephalic, atraumatic Eyes-  Sclera clear, conjunctiva pink Ears- hearing intact Oropharynx- clear Neck- supple Lungs- Clear to ausculation bilaterally, normal work of breathing Heart- Regular rate and rhythm, no murmurs, rubs or gallops GI- soft, NT, ND, + BS Extremities- no clubbing, cyanosis, or edema Skin- no rash or lesion Psych- euthymic mood, full affect Neuro- strength and sensation are intact  Labs    CBC Recent Labs    05/20/19 0346 05/21/19 0424  WBC 4.6 4.8  HGB 11.6* 10.8*  HCT 37.8* 35.4*  MCV 84.8 85.3  PLT 180 [id number]   Basic Metabolic Panel Recent Labs    05/19/19 0413 05/20/19 0346 05/21/19 0424  NA 143 142 141  K 3.5 3.6 3.4*  CL 106 105 105  CO2 25 24 26   GLUCOSE 90 171* 147*  BUN 34* 31* 29*  CREATININE 1.81* 1.63* 1.68*  CALCIUM 8.9 9.0 8.9  MG 1.6*  --   --    Liver Function Tests No results for input(s): AST, ALT, ALKPHOS, BILITOT, PROT, ALBUMIN in the last 72 hours. No results for input(s): LIPASE, AMYLASE in the last 72 hours. Cardiac Enzymes No results for input(s): CKTOTAL, CKMB, CKMBINDEX, TROPONINI in the last 72  hours. BNP Invalid input(s): POCBNP D-Dimer No results for input(s): DDIMER in the last 72 hours. Hemoglobin A1C No results for input(s): HGBA1C in the last 72 hours. Fasting Lipid Panel No results for input(s): CHOL, HDL, LDLCALC, TRIG, CHOLHDL, LDLDIRECT in the last 72 hours. Thyroid Function Tests No results for input(s): TSH, T4TOTAL, T3FREE, THYROIDAB in the last 72 hours.  Invalid input(s): FREET3  Telemetry    NSR/Sinus tach 90-100s (personally reviewed)  Radiology    [place] EKG SITE RITE  Result Date: 05/19/2019 If Site Rite image not attached, placement could not be confirmed due to current cardiac rhythm.    Patient Profile     [name] is a 68 y.o. male with a past medical history significant for chronic systolic CHF and persistant  AF.[name]  He was admitted for A/C systolic CHF.   Assessment & Plan    1. Persistent atrial fibrillation Maintaining NSR/sinus tachy [name] discuss timing of IV amiodarone -> po with MD.  Can use oral amio prn.   2. Acute on chronic systolic CHF AHF team following Volume status remains at least mildly elevated. CVP 12-13.  3. AKI on CKD III Baseline creatinine appears 1.8 - 2.0 Follow  4. HTN Stable this am with up-titration of BB  For questions or updates, please contact [place] Please consult [email or web address] for contact info under Cardiology/STEMI.  Signed, Shirley Friar, PA-C  05/21/2019, 7:08 AM   I have seen and examined this patient with [name].  Agree with above, note added to reflect my findings.  On exam, RRR, no murmurs, lungs clear. Patient continues to be in sinus tachycardia/atrial tachycardia. [name] attempt to switch amiodarone to PO with zofran. [name] likely need uptitration of HF meds but [name] allow those changes to be made by HF team. He had bladder spasms with reinsertion of the foley catheter. May need to leave it in and have him follow up in clinic with his urologist.    [name] M. Kevin Missey MD 05/21/2019 9:36 AM

## 2019-05-21 NOTE — Progress Notes (Signed)
Pt placed on nasal CPAP for the night.  Pt tolerating well at this time.  RT will continue to monitor.

## 2019-05-22 ENCOUNTER — Inpatient Hospital Stay (HOSPITAL_COMMUNITY): Payer: Medicare Other

## 2019-05-22 LAB — BASIC METABOLIC PANEL
Anion gap: 7 (ref 5–15)
BUN: 28 mg/dL — ABNORMAL HIGH (ref 8–23)
CO2: 28 mmol/L (ref 22–32)
Calcium: 8.6 mg/dL — ABNORMAL LOW (ref 8.9–10.3)
Chloride: 105 mmol/L (ref 98–111)
Creatinine, Ser: 1.85 mg/dL — ABNORMAL HIGH (ref 0.61–1.24)
GFR calc Af Amer: 43 mL/min — ABNORMAL LOW (ref >60)
GFR calc non Af Amer: 37 mL/min — ABNORMAL LOW (ref >60)
Glucose, Bld: 120 mg/dL — ABNORMAL HIGH (ref 70–99)
Potassium: 3.7 mmol/L (ref 3.5–5.1)
Sodium: 140 mmol/L (ref 135–145)

## 2019-05-22 LAB — GLUCOSE, CAPILLARY
Glucose-Capillary: 114 mg/dL — ABNORMAL HIGH (ref 70–99)
Glucose-Capillary: 238 mg/dL — ABNORMAL HIGH (ref 70–99)
Glucose-Capillary: 112 mg/dL — ABNORMAL HIGH (ref 70–99)
Glucose-Capillary: 117 mg/dL — ABNORMAL HIGH (ref 70–99)

## 2019-05-22 LAB — COOXEMETRY PANEL
Carboxyhemoglobin: 1.1 % (ref 0.5–1.5)
Carboxyhemoglobin: 1.4 % (ref 0.5–1.5)
Methemoglobin: 0.3 % (ref 0.0–1.5)
Methemoglobin: 0.5 % (ref 0.0–1.5)
O2 Saturation: 42.3 %
O2 Saturation: 52.4 %
Total hemoglobin: 16.4 g/dL — ABNORMAL HIGH (ref 12.0–16.0)
Total hemoglobin: 11.1 g/dL — ABNORMAL LOW (ref 12.0–16.0)

## 2019-05-22 MED ORDER — DIGOXIN 125 MCG PO TABS
0.1250 mg | ORAL_TABLET | Freq: Every day | ORAL | Status: DC
  Administered 2019-05-22 – 2019-05-23 (×2): 0.125 mg via ORAL
  Filled 2019-05-22 (×2): qty 1

## 2019-05-22 MED ORDER — FUROSEMIDE 10 MG/ML IJ SOLN
80.0000 mg | Freq: Two times a day (BID) | INTRAMUSCULAR | Status: DC
  Administered 2019-05-22: 80 mg via INTRAVENOUS
  Filled 2019-05-22: qty 8

## 2019-05-22 MED ORDER — ONDANSETRON 4 MG PO TBDP: 4.0000 mg | ORAL_TABLET | Freq: Three times a day (TID) | ORAL | Status: DC | PRN

## 2019-05-22 NOTE — Plan of Care (Signed)

## 2019-05-22 NOTE — Plan of Care (Signed)
  Problem: Coping: Goal: Level of anxiety will decrease 05/22/2019 1801 by Shanon Ace, RN Outcome: Progressing 05/22/2019 1800 by Shanon Ace, RN Outcome: Progressing   Problem: Elimination: Goal: Will not experience complications related to bowel motility 05/22/2019 1801 by Shanon Ace, RN Outcome: Progressing 05/22/2019 1800 by Shanon Ace, RN Outcome: Progressing Goal: Will not experience complications related to urinary retention 05/22/2019 1801 by Shanon Ace, RN Outcome: Progressing 05/22/2019 1800 by Shanon Ace, RN Outcome: Progressing   Problem: Pain Managment: Goal: General experience of comfort will improve 05/22/2019 1801 by Shanon Ace, RN Outcome: Progressing 05/22/2019 1800 by Shanon Ace, RN Outcome: Progressing   Problem: Safety: Goal: Ability to remain free from injury will improve 05/22/2019 1801 by Shanon Ace, RN Outcome: Progressing 05/22/2019 1800 by Shanon Ace, RN Outcome: Progressing   Problem: Skin Integrity: Goal: Risk for impaired skin integrity will decrease 05/22/2019 1801 by Shanon Ace, RN Outcome: Progressing 05/22/2019 1800 by Shanon Ace, RN Outcome: Progressing

## 2019-05-22 NOTE — Progress Notes (Signed)
CARDIAC REHAB PHASE I   PRE:  Rate/Rhythm: 95 SR  BP:  Supine:   Sitting: 115/75  Standing:    SaO2: 97% 2L  MODE:  Ambulation: 400 ft   POST:  Rate/Rhythm: 118 ST  BP:  Supine:   Sitting: 120/78  Standing:    SaO2: 94%RA 0910-0935 Pt walked 400 ft on RA with rolling walker and asst x 1 with steady gait. Sats good on RA but put back on oxygen when sitting in recliner. Tolerated well. Encouraged walks with staff.   Graylon Good, RN BSN  05/22/2019 9:31 AM

## 2019-05-22 NOTE — Progress Notes (Addendum)
Patient ID: [name], male   DOB: Dec 02, 1950, 68 y.o.   MRN: [id number]     Advanced Heart Failure Rounding Note  PCP-Cardiologist: Sanda Klein, MD  AHF: Dr. Haroldine Laws EP: Dr. Curt Bears   Subjective:    68 y/o PAF/AFL, HTN, OSA, CKD 3b and systolic HF felt due to tachy-induced CM  Admitted 12/9  for PVI. Pre-op TEE LVEF 10% RV HK.  Had successful PVI. Post PVI developed cardiogenic shock. Treated initially with vasopressin and neo. Then bolused with epi. We switched to dobutamine and NE with good effect. Swan placed in cath lab with elevated filling pressures and normal output on pressors .  Extubated 12/10  He is now off dobutamine. Co-ox dropped down from 65>>52% yesterday. Co-ox still low but stable at 52% today.    CVP disconnected. RN to reset. Was 12-14 by EP team assessment this AM. Wt stable past 48 hrs ~195-196 lb. No resting dyspnea but still desaturating at time. O2 added to CPAP last night. On torsemide 60 daily. 1.2L out yesterday. Bump in SCr from 1.65>>1.85.   Maintaining NSR. Tolerating PO amio w/ Zofran (prior h/o nausea w/ amio). K 3.7.   Course c/b development of urinary retention requiring foley placement    Objective:   Weight Range: 89.3 kg Body mass index is 27.46 kg/m.   Vital Signs:   Temp:  [97.4 F (36.3 C)-98.8 F (37.1 C)] 97.4 F (36.3 C) (12/15 0443) Pulse Rate:  [90-104] 92 (12/15 0817) Resp:  [16-25] 16 (12/15 0813) BP: (105-141)/(76-98) 118/80 (12/15 0817) SpO2:  [91 %-100 %] 100 % (12/15 0443) Weight:  [89.3 kg] 89.3 kg (12/15 0206) Last BM Date: 05/20/19  Weight change: Filed Weights   05/20/19 0344 05/21/19 0300 05/22/19 0206  Weight: 89.2 kg 88.5 kg 89.3 kg    Intake/Output:   Intake/Output Summary (Last 24 hours) at 05/22/2019 0944 Last data filed at 05/21/2019 2300 Gross per 24 hour  Intake 240 ml  Output 1400 ml  Net -1160 ml      Physical Exam    PHYSICAL EXAM: General:  Sitting in chair. No respiratory  difficulty HEENT: normal Neck: supple. JVP to jaw. Swan in [place] 2+ bilat; no bruits. No lymphadenopathy or thyromegaly appreciated. Cor: PMI laterally displaced. Tachy mildly irregular No rubs, gallops or murmurs. Lungs: clear Abdomen: soft, nontender, nondistended. No hepatosplenomegaly. No bruits or masses. Good bowel sounds. GU: foley in place  Extremities: no cyanosis, clubbing, rash, trace edema Neuro: alert & oriented x 3, cranial nerves grossly intact. moves all 4 extremities w/o difficulty. Affect pleasany   Telemetry   NSR 90s  Personally reviewed  Labs    CBC Recent Labs    05/20/19 0346 05/21/19 0424  WBC 4.6 4.8  HGB 11.6* 10.8*  HCT 37.8* 35.4*  MCV 84.8 85.3  PLT 180 [id number]   Basic Metabolic Panel Recent Labs    05/21/19 0424 05/22/19 0501  NA 141 140  K 3.4* 3.7  CL 105 105  CO2 26 28  GLUCOSE 147* 120*  BUN 29* 28*  CREATININE 1.68* 1.85*  CALCIUM 8.9 8.6*   Liver Function Tests No results for input(s): AST, ALT, ALKPHOS, BILITOT, PROT, ALBUMIN in the last 72 hours. No results for input(s): LIPASE, AMYLASE in the last 72 hours. Cardiac Enzymes No results for input(s): CKTOTAL, CKMB, CKMBINDEX, TROPONINI in the last 72 hours.  BNP: BNP (last 3 results) Recent Labs    05/16/19 1200  BNP 995.4*    ProBNP (  last 3 results) No results for input(s): PROBNP in the last 8760 hours.   D-Dimer No results for input(s): DDIMER in the last 72 hours. Hemoglobin A1C No results for input(s): HGBA1C in the last 72 hours. Fasting Lipid Panel No results for input(s): CHOL, HDL, LDLCALC, TRIG, CHOLHDL, LDLDIRECT in the last 72 hours. Thyroid Function Tests No results for input(s): TSH, T4TOTAL, T3FREE, THYROIDAB in the last 72 hours.  Invalid input(s): FREET3  Other results:   Imaging    No results found.   Medications:     Scheduled Medications: . amiodarone  200 mg Oral BID  . apixaban  5 mg Oral BID  . Chlorhexidine Gluconate  Cloth  6 each Topical Daily  . feeding supplement (ENSURE ENLIVE)  237 mL Oral BID BM  . insulin aspart  0-5 Units Subcutaneous QHS  . insulin aspart  0-9 Units Subcutaneous TID WC  . isosorbide-hydrALAZINE  0.5 tablet Oral TID  . mouth rinse  15 mL Mouth Rinse BID  . mouth rinse  15 mL Mouth Rinse BID  . metoprolol succinate  12.5 mg Oral BID  . oxybutynin  10 mg Oral Q24H  . pantoprazole  40 mg Oral Daily  . pravastatin  40 mg Oral q1800  . sodium chloride flush  10-40 mL Intracatheter Q12H  . sodium chloride flush  3 mL Intravenous Q12H  . tamsulosin  0.4 mg Oral Daily  . torsemide  60 mg Oral Daily    Infusions: . sodium chloride Stopped (05/19/19 0318)  . sodium chloride 10 mL/hr at 05/19/19 2300    PRN Medications: sodium chloride, acetaminophen, albuterol, ALPRAZolam, guaiFENesin, menthol-cetylpyridinium, ondansetron (ZOFRAN) IV, sodium chloride flush, sodium chloride flush, traMADol    Patient Profile   68 y/oM w/ PAF/AFL, HTN, OSA, CKD stage 3b and systolic HF felt due to tachy-induced CM, admitted for PVI. Post procedural course c/b cardiogenic shock requiring intubation + inotrope/ pressor support.    Assessment/Plan   1. Cardiogenic shock: - NE and dobutamine discontinued - SBP 110s, co-ox 52%  - On torsemide 60 mg daily (was on Lasix 80 qam/40 qpm at home). W/ elevated CVPs, may need dose of IV Lasix - Continue Bidil 0.5 tab tid.  - Continue Toprol XL 12.5 mg bid.     2. Acute on chronic systolic HF with biventricualr failure due to tachy CM - EF 10% by TEE 12/9 likely due to tachy CM - As above, needs IV Lasix w/ elevated CVPs  - Continue Bidil + Toprol XL as above.   - consider adding digoxin, but w/ bump in SCr, will hold off today - later addition of spiro if renal function stabilizes  - hopefully EF with recover with treatment of AF  3. Recurrent AF - s/p successful PVI 12/9. Remains in NSR today.  - Tikosyn off due to AKI and now on PO  amiodarone - Continue Eliquis - Continue Toprol XL  - EP following.   4. Acute hypoxic respiratory failure.  - extubated. stable  5. AKI on CKD 3b - SCr peaked at 2.45. Suspect AKI 2/2 cardiogenic shock.  - Scr trending up again, 1.68>>1.85  6. Urinary retention - Also with severe bladder spasms. .  - continue w/ foley.  - continue Flomax and oxybutynin.  - will need to f/u with urology post discharge  Length of Stay: [place], [place]  9:44 AM   Advanced Heart Failure Team Pager [phone] (M-F; 7a - 4p)  Please contact [facility] Cardiology  for night-coverage after hours (4p -7a ) and weekends on [email or web address]   Patient seen and examined with the above-signed Advanced Practice Provider and/or Housestaff. I personally reviewed laboratory data, imaging studies and relevant notes. I independently examined the patient and formulated the important aspects of the plan. I have edited the note to reflect any of my changes or salient points. I have personally discussed the plan with the patient and/or family.  He is feeling better but remains tenuous. Co-ox low at 52%. CVP checked personally ~12. Remains in MAT. Had brief period of atrial tach last night but resolved. Now on IV amio and Eliquis. Creatinine up slightly. Continues with Foley due to urinary retention. Will get renal u/s to exclude hydro.   Start IV lasix 80 IV bid. Agree with digoxin. If co-ox fall may need to restart dobutamine.   [name], MD  2:08 PM

## 2019-05-22 NOTE — Progress Notes (Signed)
Patient continuing to have periods of desaturation with CPAP on. Patient states he sleeps with Nasal Pillows at home but he seems to be sleeping with his mouth open and the CPAP in turn doesn't work . He does recover well, but O2 bleed in added at 5L to help compensate if possible. RN Aware

## 2019-05-22 NOTE — Progress Notes (Addendum)
Electrophysiology Rounding Note  Patient Name: [name] Date of Encounter: 05/22/2019  Primary Cardiologist: Sanda Klein, MD Electrophysiologist: Constance Haw, MD   Subjective   The patient is feeling OK this am. He tolerated po amio yesterday when given along with Zofran. Denies SOB at rest, but still desaturating at times. O2 added to his CPAP over night.   CVP 12-14. Initial Coox 42%. Repeat pending.    Inpatient Medications    Scheduled Meds: . amiodarone  200 mg Oral BID  . apixaban  5 mg Oral BID  . Chlorhexidine Gluconate Cloth  6 each Topical Daily  . feeding supplement (ENSURE ENLIVE)  237 mL Oral BID BM  . insulin aspart  0-5 Units Subcutaneous QHS  . insulin aspart  0-9 Units Subcutaneous TID WC  . isosorbide-hydrALAZINE  0.5 tablet Oral TID  . mouth rinse  15 mL Mouth Rinse BID  . mouth rinse  15 mL Mouth Rinse BID  . metoprolol succinate  12.5 mg Oral BID  . oxybutynin  10 mg Oral Q24H  . pantoprazole  40 mg Oral Daily  . pravastatin  40 mg Oral q1800  . sodium chloride flush  10-40 mL Intracatheter Q12H  . sodium chloride flush  3 mL Intravenous Q12H  . tamsulosin  0.4 mg Oral Daily  . torsemide  60 mg Oral Daily   Continuous Infusions: . sodium chloride Stopped (05/19/19 0318)  . sodium chloride 10 mL/hr at 05/19/19 2300   PRN Meds: sodium chloride, acetaminophen, albuterol, ALPRAZolam, guaiFENesin, menthol-cetylpyridinium, ondansetron (ZOFRAN) IV, sodium chloride flush, sodium chloride flush, traMADol   Vital Signs    Vitals:   05/21/19 2045 05/21/19 2353 05/22/19 0206 05/22/19 0443  BP: 106/88   (!) 141/98  Pulse: (!) 104 97 95 90  Resp: 19 16 19 18   Temp: 98.5 F (36.9 C) 98.8 F (37.1 C)  (!) 97.4 F (36.3 C)  TempSrc: Oral Oral  Oral  SpO2: 93% 94% 98% 100%  Weight:   89.3 kg   Height:        Intake/Output Summary (Last 24 hours) at 05/22/2019 0744 Last data filed at 05/21/2019 2300 Gross per 24 hour  Intake 240  ml  Output 1400 ml  Net -1160 ml   Filed Weights   05/20/19 0344 05/21/19 0300 05/22/19 0206  Weight: 89.2 kg 88.5 kg 89.3 kg    Physical Exam    GEN- The patient is fatigued appearing, alert and oriented x 3 today.   Head- normocephalic, atraumatic Eyes-  Sclera clear, conjunctiva pink Ears- hearing intact Oropharynx- clear Neck- supple Lungs- Somewhat diminished. Normal work of breathing at rest. Heart- Regular rate and rhythm, no murmurs, rubs or gallops GI- soft, NT, ND, + BS Extremities- no clubbing or cyanosis. RUE PICC site stable.  Skin- no rash or lesion Psych- euthymic mood, full affect Neuro- strength and sensation are intact  Labs    CBC Recent Labs    05/20/19 0346 05/21/19 0424  WBC 4.6 4.8  HGB 11.6* 10.8*  HCT 37.8* 35.4*  MCV 84.8 85.3  PLT 180 [id number]   Basic Metabolic Panel Recent Labs    05/21/19 0424 05/22/19 0501  NA 141 140  K 3.4* 3.7  CL 105 105  CO2 26 28  GLUCOSE 147* 120*  BUN 29* 28*  CREATININE 1.68* 1.85*  CALCIUM 8.9 8.6*   Liver Function Tests No results for input(s): AST, ALT, ALKPHOS, BILITOT, PROT, ALBUMIN in the last 72 hours. No results  for input(s): LIPASE, AMYLASE in the last 72 hours. Cardiac Enzymes No results for input(s): CKTOTAL, CKMB, CKMBINDEX, TROPONINI in the last 72 hours.   Telemetry    NSR 80-90 (personally reviewed)  Radiology    No results found.  Patient Profile     [name] is a 68 y.o. male with a past medical history significant for chronic systolic CHF and persistant AF.[name]  He was admitted for A/C systolic CHF.   Assessment & Plan    1. Persistent atrial fibrillation s/p ablation 05/16/2019 Maintaining NSR/sinus tachy Continue po amio with zofran prn. Continue eliqus for CHA2DS2VASC of 6    2. Acute on chronic systolic CHF TEE 123456 with LVEF ~ 10%  RHC 05/16/2019 with marked volume overload and stable hemodynamics ON inotrope support. Coox 65 -> 52 -> 42.3%.   CVP ~  13 Recheck Coox.  D[place] Ament likely need another dose of IV lasix, and possible to go back on inotrope support pending repeat Coox AHF team following. Digoxin added.  On Toprol 12.5 mg BID. May need to adjust if output low.   3. AKI on CKD III Creatinine 1.85 today which is near his baseline. Follo  4. Urinary retention Foley in place. Shayana Hornstein need to see urologist on discharge. May need to go home with foley in place.   ADDENDUM Repeat coox 52%  For questions or updates, please contact [place] Please consult [email or web address] for contact info under Cardiology/STEMI.  Signed, Shirley Friar, PA-C  05/22/2019, 7:44 AM   I have seen and examined this patient with [name].  Agree with above, note added to reflect my findings.  On exam, RRR, no murmurs, lungs clear.  No further evidence of atrial fibrillation or flutter.  Has had atrial tachycardia..  Currently on p.o. amiodarone as well as Zofran for his amiodarone nausea.  Plan for further titration of heart failure medications per heart failure team.  Yurika Pereda M. Matthew Pais MD 05/22/2019 2:50 PM

## 2019-05-23 LAB — BASIC METABOLIC PANEL WITH GFR
Anion gap: 11 (ref 5–15)
BUN: 35 mg/dL — ABNORMAL HIGH (ref 8–23)
CO2: 27 mmol/L (ref 22–32)
Calcium: 8.8 mg/dL — ABNORMAL LOW (ref 8.9–10.3)
Chloride: 101 mmol/L (ref 98–111)
Creatinine, Ser: 2.14 mg/dL — ABNORMAL HIGH (ref 0.61–1.24)
GFR calc Af Amer: 36 mL/min — ABNORMAL LOW
GFR calc non Af Amer: 31 mL/min — ABNORMAL LOW
Glucose, Bld: 118 mg/dL — ABNORMAL HIGH (ref 70–99)
Potassium: 3.6 mmol/L (ref 3.5–5.1)
Sodium: 139 mmol/L (ref 135–145)

## 2019-05-23 LAB — COOXEMETRY PANEL
Carboxyhemoglobin: 1.9 % — ABNORMAL HIGH (ref 0.5–1.5)
Methemoglobin: 1 % (ref 0.0–1.5)
O2 Saturation: 55.8 %
Total hemoglobin: 10.1 g/dL — ABNORMAL LOW (ref 12.0–16.0)

## 2019-05-23 LAB — GLUCOSE, CAPILLARY
Glucose-Capillary: 123 mg/dL — ABNORMAL HIGH (ref 70–99)
Glucose-Capillary: 157 mg/dL — ABNORMAL HIGH (ref 70–99)
Glucose-Capillary: 147 mg/dL — ABNORMAL HIGH (ref 70–99)
Glucose-Capillary: 169 mg/dL — ABNORMAL HIGH (ref 70–99)

## 2019-05-23 LAB — MAGNESIUM: Magnesium: 1.8 mg/dL (ref 1.7–2.4)

## 2019-05-23 MED ORDER — PRO-STAT SUGAR FREE PO LIQD
30.0000 mL | Freq: Two times a day (BID) | ORAL | Status: DC
  Administered 2019-05-23 – 2019-05-29 (×13): 30 mL via ORAL
  Filled 2019-05-23 (×13): qty 30

## 2019-05-23 MED ORDER — FUROSEMIDE 10 MG/ML IJ SOLN
80.0000 mg | Freq: Once | INTRAMUSCULAR | Status: AC
  Administered 2019-05-23: 12:00:00 80 mg via INTRAVENOUS
  Filled 2019-05-23: qty 8

## 2019-05-23 MED ORDER — POTASSIUM CHLORIDE CRYS ER 20 MEQ PO TBCR
40.0000 meq | EXTENDED_RELEASE_TABLET | Freq: Once | ORAL | Status: AC
  Administered 2019-05-23: 40 meq via ORAL
  Filled 2019-05-23: qty 2

## 2019-05-23 NOTE — Progress Notes (Addendum)
Electrophysiology Rounding Note  Patient Name: [name] Date of Encounter: 05/23/2019  Primary Cardiologist: Kevin Klein, MD  HF: Dr. Haroldine [name]  Electrophysiologist: Kevin Stevens Meredith [name], MD   Subjective   The patient is doing well today.  At this time, the patient denies chest pain, shortness of breath, or any new concerns.  CVP improved to 9 from 12-13 with dose of IV lasix.  Cr 1.8 -> 2.1 (Lasix held this am)   Inpatient Medications    Scheduled Meds: . amiodarone  200 mg Oral BID  . apixaban  5 mg Oral BID  . Chlorhexidine Gluconate Cloth  6 each Topical Daily  . digoxin  0.125 mg Oral Daily  . feeding supplement (ENSURE ENLIVE)  237 mL Oral BID BM  . insulin aspart  0-5 Units Subcutaneous QHS  . insulin aspart  0-9 Units Subcutaneous TID WC  . isosorbide-hydrALAZINE  0.5 tablet Oral TID  . mouth rinse  15 mL Mouth Rinse BID  . mouth rinse  15 mL Mouth Rinse BID  . metoprolol succinate  12.5 mg Oral BID  . oxybutynin  10 mg Oral Q24H  . pantoprazole  40 mg Oral Daily  . pravastatin  40 mg Oral q1800  . sodium chloride flush  10-40 mL Intracatheter Q12H  . sodium chloride flush  3 mL Intravenous Q12H  . tamsulosin  0.4 mg Oral Daily   Continuous Infusions: . sodium chloride Stopped (05/19/19 0318)  . sodium chloride 10 mL/hr at 05/19/19 2300   PRN Meds: sodium chloride, acetaminophen, albuterol, ALPRAZolam, guaiFENesin, menthol-cetylpyridinium, ondansetron (ZOFRAN) IV, ondansetron, sodium chloride flush, sodium chloride flush, traMADol   Vital Signs    Vitals:   05/23/19 0005 05/23/19 0300 05/23/19 0436 05/23/19 0500  BP: 125/64  98/71   Pulse: 92 86 81   Resp: 20 16 (!) 25   Temp: 98.1 F (36.7 C)  97.6 F (36.4 C)   TempSrc: Oral  Oral   SpO2: 97% 96% 96%   Weight:    89.2 kg  Height:        Intake/Output Summary (Last 24 hours) at 05/23/2019 0724 Last data filed at 05/23/2019 0009 Gross per 24 hour  Intake 70 ml  Output 1850 ml  Net  -1780 ml   Filed Weights   05/21/19 0300 05/22/19 0206 05/23/19 0500  Weight: 88.5 kg 89.3 kg 89.2 kg    Physical Exam    GEN- The patient is well appearing, alert and oriented x 3 today.   Head- normocephalic, atraumatic Eyes-  Sclera clear, conjunctiva pink Ears- hearing intact Oropharynx- clear Neck- supple Lungs- Clear to ausculation bilaterally, normal work of breathing Heart- Regular rate and rhythm, no murmurs, rubs or gallops GI- soft, NT, ND, + BS Extremities- no clubbing, cyanosis, or edema Skin- no rash or lesion Psych- euthymic mood, full affect Neuro- strength and sensation are intact  Labs    CBC Recent Labs    05/21/19 0424  WBC 4.8  HGB 10.8*  HCT 35.4*  MCV 85.3  PLT [id number]   Basic Metabolic Panel Recent Labs    05/22/19 0501 05/23/19 0330  NA 140 139  K 3.7 3.6  CL 105 101  CO2 28 27  GLUCOSE 120* 118*  BUN 28* 35*  CREATININE 1.85* 2.14*  CALCIUM 8.6* 8.8*   Liver Function Tests No results for input(s): AST, ALT, ALKPHOS, BILITOT, PROT, ALBUMIN in the last 72 hours. No results for input(s): LIPASE, AMYLASE in the last 72 hours. Cardiac  Enzymes No results for input(s): CKTOTAL, CKMB, CKMBINDEX, TROPONINI in the last 72 hours.   Telemetry    NSR 70-90s with PACs, occasional up to 100s/sinus tach (personally reviewed)  Radiology    US RENAL  Result Date: 05/22/2019 CLINICAL DATA:  Urinary retention EXAM: RENAL / URINARY TRACT ULTRASOUND COMPLETE COMPARISON:  02/19/2019 FINDINGS: Right Kidney: Renal measurements: 10.4 x 6.3 x 4.2 = volume: 138 mL. Slight increased echotexture. No mass or hydronephrosis. Left Kidney: Renal measurements: 12.7 x 6.2 x 6.2 cm = volume: 252 mL. Multiple cysts including 8 cm upper pole cyst and 4.8 cm midpole cyst. Increased echotexture. No hydronephrosis. Bladder: Foley catheter in place, decompressed. Other: None. IMPRESSION: Mild increased echotexture throughout the kidneys compatible with chronic medical  renal disease. Simple appearing left renal cysts. No acute findings.  No hydronephrosis. Electronically Signed   By: Rolm Baptise M.D.   On: 05/22/2019 19:37    Patient Profile     [name] is a 68 y.o. male with a past medical history significant for chronic systolic CHF and persistant AF.[name]  He was admitted for A/C systolic CHF s/p PVI.   Assessment & Plan    1. Persistent atrial fibrillation s/p ablation 05/16/2019 Maintaining NSR with sinus tach and AT occasionally + PACs Tolerating po amio since 12/14. Use Zofran prn.  Continue eliqus for CHA2DS2VASC of 6     2. Acute on chronic systolic CHF TEE 123456 with LVEF ~ 10%  RHC 05/16/2019 with marked volume overload and stable hemodynamics ON inotrope support. Coox 55.8% this am after IV lasix yesterday. CVP 13 -> ~9 (checked personally).  Can likely adjust back to po diuretics with mild AKI. [name] hold IV lasix and discuss with AHF team.  Continue Digoxin  Continue Toprol 12.5 mg BID cautiously with borderline cardiac output.    3. AKI on CKD III Creatinine 1.85 -> 2.14 with IV lasix last night. CVP down to 9. [name] hold IV lasix and discuss with HF team. May be able to transition back to po.    4. Urinary retention Foley in place and stable.  [name] need to see urologist on discharge. May need to go home with foley in place.   For questions or updates, please contact [name] Please consult [email or web address] for contact info under Cardiology/STEMI.  Signed, Kevin Friar, PA-C  05/23/2019, 7:24 AM   I have seen and examined this patient with [name].  Agree with above, note added to reflect my findings.  On exam, RRR, no murmurs, lungs clear.  Patient continuing to feel well.  He remains in sinus rhythm.  He is overall been improving now that he has maintained sinus rhythm.  Currently on low doses of heart failure medications.  Would continue amiodarone p.o.  Kevin Thurmon M. Orabelle Rylee MD 05/23/2019 3:08 PM

## 2019-05-23 NOTE — Progress Notes (Signed)
CARDIAC REHAB PHASE I   PRE:  Rate/Rhythm: 93 SR  BP:  Supine:   Sitting: 135/68  Standing:    SaO2: 93%RA  MODE:  Ambulation: 800 ft   POST:  Rate/Rhythm: 117  BP:  Supine:   Sitting: 119/88  Standing:    SaO2: 95%RA 1042-1112 Pt walked 800 ft on RA with rolling walker and asst x 1. Tolerated increase in distance well. To recliner after walk. Cardiology in to assess pt. No c/o SOB with walk.   Graylon Good, RN BSN  05/23/2019 11:09 AM

## 2019-05-23 NOTE — Progress Notes (Addendum)
Patient ID: [name], male   DOB: 1951/03/24, 68 y.o.   MRN: [id number]     Advanced Heart Failure Rounding Note  PCP-Cardiologist: Sanda Klein, MD  AHF: Dr. Haroldine Laws EP: Dr. Curt Bears   Subjective:    68 y/o PAF/AFL, HTN, OSA, CKD 3b and systolic HF felt due to tachy-induced CM  Admitted 12/9  for PVI. Pre-op TEE LVEF 10% RV HK.  Had successful PVI. Post PVI developed cardiogenic shock. Treated initially with vasopressin and neo. Then bolused with epi. We switched to dobutamine and NE with good effect. Swan placed in cath lab with elevated filling pressures and normal output on pressors .  Extubated 12/10  He is now off dobutamine. Digoxin added 12/15. Co-ox 56% today.  80 IV lasix given x 1 yesterday for volume overload. -1.8L out. CVP 11-12. Still w/ orthopnea and LEE.  Significant bump in SCr from 1.85>>2.14. K 3.6.   Maintaining NSR w/ intermittent sinus tach/ AT. Tolerating PO amio w/ Zofran (prior h/o nausea w/ amio).   Course c/b development of urinary retention requiring foley placement. Renal US 12/15 showed no hydronephrosis.    Objective:   Weight Range: 89.2 kg Body mass index is 27.43 kg/m.   Vital Signs:   Temp:  [97.6 F (36.4 C)-98.4 F (36.9 C)] 98.2 F (36.8 C) (12/16 1043) Pulse Rate:  [81-104] 93 (12/16 1043) Resp:  [16-25] 18 (12/16 1043) BP: (98-135)/(64-77) 135/68 (12/16 1043) SpO2:  [96 %-97 %] 96 % (12/16 0436) Weight:  [89.2 kg] 89.2 kg (12/16 0500) Last BM Date: 05/20/19  Weight change: Filed Weights   05/21/19 0300 05/22/19 0206 05/23/19 0500  Weight: 88.5 kg 89.3 kg 89.2 kg    Intake/Output:   Intake/Output Summary (Last 24 hours) at 05/23/2019 1049 Last data filed at 05/23/2019 0009 Gross per 24 hour  Intake 20 ml  Output 1850 ml  Net -1830 ml      Physical Exam    CVP 11-12 PHYSICAL EXAM: General:  Well appearing AAM Sitting in chair. No respiratory difficulty HEENT: normal Neck: supple. Elevated JVD Swan in  RIJ has been removed. Carotids 2+ bilat; no bruits. No lymphadenopathy or thyromegaly appreciated. Cor: PMI laterally displaced. Tachy mildly irregular No rubs, gallops or murmurs. Lungs: clear Abdomen: soft, nontender, nondistended. No hepatosplenomegaly. No bruits or masses. Good bowel sounds. GU: foley in place  Extremities: no cyanosis, clubbing, rash, trace 1+ pretibial edema, L>R Neuro: alert & oriented x 3, cranial nerves grossly intact. moves all 4 extremities w/o difficulty. Affect pleasany   Telemetry   NSR 90s w/ occasional ST/AT  Personally reviewed  Labs    CBC Recent Labs    05/21/19 0424  WBC 4.8  HGB 10.8*  HCT 35.4*  MCV 85.3  PLT [id number]   Basic Metabolic Panel Recent Labs    05/22/19 0501 05/23/19 0330  NA 140 139  K 3.7 3.6  CL 105 101  CO2 28 27  GLUCOSE 120* 118*  BUN 28* 35*  CREATININE 1.85* 2.14*  CALCIUM 8.6* 8.8*   Liver Function Tests No results for input(s): AST, ALT, ALKPHOS, BILITOT, PROT, ALBUMIN in the last 72 hours. No results for input(s): LIPASE, AMYLASE in the last 72 hours. Cardiac Enzymes No results for input(s): CKTOTAL, CKMB, CKMBINDEX, TROPONINI in the last 72 hours.  BNP: BNP (last 3 results) Recent Labs    05/16/19 1200  BNP 995.4*    ProBNP (last 3 results) No results for input(s): PROBNP in the last 8760 hours.  D-Dimer No results for input(s): DDIMER in the last 72 hours. Hemoglobin A1C No results for input(s): HGBA1C in the last 72 hours. Fasting Lipid Panel No results for input(s): CHOL, HDL, LDLCALC, TRIG, CHOLHDL, LDLDIRECT in the last 72 hours. Thyroid Function Tests No results for input(s): TSH, T4TOTAL, T3FREE, THYROIDAB in the last 72 hours.  Invalid input(s): FREET3  Other results:   Imaging    US RENAL  Result Date: 05/22/2019 CLINICAL DATA:  Urinary retention EXAM: RENAL / URINARY TRACT ULTRASOUND COMPLETE COMPARISON:  02/19/2019 FINDINGS: Right Kidney: Renal measurements: 10.4 x 6.3 x  4.2 = volume: 138 mL. Slight increased echotexture. No mass or hydronephrosis. Left Kidney: Renal measurements: 12.7 x 6.2 x 6.2 cm = volume: 252 mL. Multiple cysts including 8 cm upper pole cyst and 4.8 cm midpole cyst. Increased echotexture. No hydronephrosis. Bladder: Foley catheter in place, decompressed. Other: None. IMPRESSION: Mild increased echotexture throughout the kidneys compatible with chronic medical renal disease. Simple appearing left renal cysts. No acute findings.  No hydronephrosis. Electronically Signed   By: Rolm Baptise M.D.   On: 05/22/2019 19:37     Medications:     Scheduled Medications: . amiodarone  200 mg Oral BID  . apixaban  5 mg Oral BID  . Chlorhexidine Gluconate Cloth  6 each Topical Daily  . digoxin  0.125 mg Oral Daily  . feeding supplement (ENSURE ENLIVE)  237 mL Oral BID BM  . feeding supplement (PRO-STAT SUGAR FREE 64)  30 mL Oral BID  . insulin aspart  0-5 Units Subcutaneous QHS  . insulin aspart  0-9 Units Subcutaneous TID WC  . isosorbide-hydrALAZINE  0.5 tablet Oral TID  . mouth rinse  15 mL Mouth Rinse BID  . metoprolol succinate  12.5 mg Oral BID  . oxybutynin  10 mg Oral Q24H  . pantoprazole  40 mg Oral Daily  . pravastatin  40 mg Oral q1800  . sodium chloride flush  10-40 mL Intracatheter Q12H  . sodium chloride flush  3 mL Intravenous Q12H  . tamsulosin  0.4 mg Oral Daily    Infusions: . sodium chloride Stopped (05/19/19 0318)  . sodium chloride 10 mL/hr at 05/19/19 2300    PRN Medications: sodium chloride, acetaminophen, albuterol, ALPRAZolam, guaiFENesin, menthol-cetylpyridinium, ondansetron (ZOFRAN) IV, ondansetron, sodium chloride flush, sodium chloride flush, traMADol    Patient Profile   68 y/oM w/ PAF/AFL, HTN, OSA, CKD stage 3b and systolic HF felt due to tachy-induced CM, admitted for PVI. Post procedural course c/b cardiogenic shock requiring intubation + inotrope/ pressor support.    Assessment/Plan   1. Cardiogenic  shock: - NE and dobutamine discontinued, digoxin added yesterday but w/ rising SCr > 2, will hold - Co-ox 56%. BP stable.  - Continue Bidil 0.5 tab tid.  - Continue Toprol XL 12.5 mg bid.     2. Acute on chronic systolic HF with biventricualr failure due to tachy CM - EF 10% by TEE 12/9 likely due to tachy CM - remains volume up, CVP 11-12, LEE on exam, symptomatic w/ orthopnea - Will give another dose of IV Lasix 80 mg x 1. - Add TED hoses for LEE - Hold Digoxin for rising SCr.  - No ACE/ARB/ARNi nor spiro w/ AKI  - Continue Bidil + Toprol XL as above.   - hopefully EF with recover with treatment of AF  3. Recurrent AF - s/p successful PVI 12/9. Remains in NSR/ ST.  - Tikosyn off due to AKI and now on PO  amiodarone - Continue Eliquis - Continue Toprol XL  - EP following.   4. Acute hypoxic respiratory failure.  - extubated. stable  5. AKI on CKD 3b - SCr peaked at 2.45. Suspect AKI 2/2 cardiogenic shock.  - Scr trending up again, 1.68>>1.85>>2.14 - Monitor closely   6. Urinary retention - Also with severe bladder spasms.  - now w/ AKI (see above) - Renal US 12/15 showed no hydronephrosis.  - continue w/ foley.  - continue Flomax and oxybutynin.  - will need to f/u with urology post discharge  Length of Stay: [place], [place]  10:49 AM   Advanced Heart Failure Team Pager [phone] (M-F; 7a - 4p)  Please contact [facility] for night-coverage after hours (4p -7a ) and weekends on [email or web address]  Patient seen and examined with the above-signed Advanced Practice Provider and/or Housestaff. I personally reviewed laboratory data, imaging studies and relevant notes. I independently examined the patient and formulated the important aspects of the plan. I have edited the note to reflect any of my changes or salient points. I have personally discussed the plan with the patient and/or family.   Now off inotropes but remains tenuous with co-ox 56%. Remains volume  overloaded and creatinine going up. Will continue IV lasix but may need to restart inotropes. Maintaining NSR on amio. Tolerating ok. Still with Foley in Place. On Eliquis for [facility] without bleeding.   On exam  General: Sitting in chair  No resp difficulty HEENT: normal Neck: supple.JVP to jaw. . Carotids 2+ bilat; no bruits. No lymphadenopathy or thryomegaly appreciated. Cor: PMI nondisplaced. Regular rate & rhythm. 2/6 MR Lungs: clear Abdomen: soft, nontender, nondistended. No hepatosplenomegaly. No bruits or masses. Good bowel sounds. Extremities: no cyanosis, clubbing, rash, 2+ ankle edema + foley  Neuro: alert & orientedx3, cranial nerves grossly intact. moves all 4 extremities w/o difficulty. Affect pleasant  Follows closely as above. Continue IV diuresis. If not responding may need to restart inotropes.   [name], MD  5:42 PM

## 2019-05-23 NOTE — Progress Notes (Signed)
Pt stated he can place self on cpap when ready for bed, RT added water to water chamber.

## 2019-05-23 NOTE — Progress Notes (Signed)
Nutrition Follow-up  RD working remotely.  DOCUMENTATION CODES:   Not applicable  INTERVENTION:   - Continue Ensure Enlive po BID, each supplement provides 350 kcal and 20 grams of protein  - Add Magic Cup TID with meals, each supplement provides 290 kcal and 9 grams of protein  - Add Pro-stat 30 ml po BID, each supplement provides 100 kcal and 15 grams of protein  - Encourage adequate PO intake  NUTRITION DIAGNOSIS:   Increased nutrient needs related to post-op healing as evidenced by estimated needs.  Ongoing, being addressed via oral nutrition supplements  GOAL:   Patient will meet greater than or equal to 90% of their needs  Progressing  MONITOR:   PO intake, Supplement acceptance, Labs, Weight trends, Skin, I & O's  REASON FOR ASSESSMENT:   Ventilator    ASSESSMENT:   Patient with PMH significant for CHF, AI, HTN, and DM. Presents this admission for elective catheter ablation for a.fib.  12/09 - s/p AF ablation, cardiogenic shock, transferred to ICU on vent 12/10 - extubated, clear liquids 12/11 - Heart Healthy diet  Unable to reach pt via phone call to room.  Per [facility] documentation, pt refusing ~50% of Ensure Enlive supplements. RD will add Pro-stat and Magic Cups to aid pt in meeting kcal and protein needs.  Meal completion for the last 4 documented meals has ranged from 25-90%. Encourage PO intake.  Weight up 7 lbs since admit. Will continue to monitor trends.  Medications reviewed and include: Ensure Enlive BID, SSI, protonix  Labs reviewed: BUN 35, creatinine 2.14 CBG's: 112-238 x 24 hours  UOP: 1850 ml x 24 hours I/O's: -8.2 L since admit  NUTRITION - FOCUSED PHYSICAL EXAM:  Unable to complete at this time. RD working remotely.  Diet Order:   Diet Order            Diet Heart Room service appropriate? Yes; Fluid consistency: Thin  Diet effective now              EDUCATION NEEDS:   No education needs have been identified at this  time  Skin:  Skin Assessment: Skin Integrity Issues: Incisions: left chest  Last BM:  05/20/19  Height:   Ht Readings from Last 1 Encounters:  05/20/19 5\' 11"  (1.803 m)    Weight:   Wt Readings from Last 1 Encounters:  05/23/19 89.2 kg    Ideal Body Weight:  78.2 kg  BMI:  Body mass index is 27.43 kg/m.  Estimated Nutritional Needs:   Kcal:  2000-2200 kcals  Protein:  100-115 grams  Fluid:  >/ 1.8 L    Gaynell Face, MS, RD, LDN Inpatient Clinical Dietitian Pager: [phone] Weekend/After Hours: [phone]

## 2019-05-23 NOTE — Discharge Instructions (Signed)
Post procedure care instructions No driving for 4 days No lifting over 5 lbs for 1 week. No vigorous/sexual activity for 1 week. You may return to work/your usual activties on 05/23/2019. Keep procedure site clean & dry. If you notice increased pain, swelling, bleeding or pus, call/return!  You may shower, but no soaking baths/hot tubs/pools for 1 week.   Information on my medicine - ELIQUIS (apixaban)  Why was Eliquis prescribed for you? Eliquis was prescribed for you to reduce the risk of a blood clot forming that can cause a stroke if you have a medical condition called atrial fibrillation (a type of irregular heartbeat).  What do You need to know about Eliquis ? Take your Eliquis TWICE DAILY - one tablet in the morning and one tablet in the evening with or without food. If you have difficulty swallowing the tablet whole please discuss with your pharmacist how to take the medication safely.  Take Eliquis exactly as prescribed by your doctor and DO NOT stop taking Eliquis without talking to the doctor who prescribed the medication.  Stopping may increase your risk of developing a stroke.  Refill your prescription before you run out.  After discharge, you should have regular check-up appointments with your healthcare provider that is prescribing your Eliquis.  In the future your dose may need to be changed if your kidney function or weight changes by a significant amount or as you get older.  What do you do if you miss a dose? If you miss a dose, take it as soon as you remember on the same day and resume taking twice daily.  Do not take more than one dose of ELIQUIS at the same time to make up a missed dose.  Important Safety Information A possible side effect of Eliquis is bleeding. You should call your healthcare provider right away if you experience any of the following: ? Bleeding from an injury or your nose that does not stop. ? Unusual colored urine (red or dark brown) or unusual  colored stools (red or black). ? Unusual bruising for unknown reasons. ? A serious fall or if you hit your head (even if there is no bleeding).  Some medicines may interact with Eliquis and might increase your risk of bleeding or clotting while on Eliquis. To help avoid this, consult your healthcare provider or pharmacist prior to using any new prescription or non-prescription medications, including herbals, vitamins, non-steroidal anti-inflammatory drugs (NSAIDs) and supplements.  This website has more information on Eliquis (apixaban): http://www.eliquis.com/eliquis/home

## 2019-05-23 NOTE — Progress Notes (Signed)
Patient wants to place himself on CPAP when ready. CPAP at bedside with water and doing well.

## 2019-05-24 LAB — GLUCOSE, CAPILLARY
Glucose-Capillary: 138 mg/dL — ABNORMAL HIGH (ref 70–99)
Glucose-Capillary: 198 mg/dL — ABNORMAL HIGH (ref 70–99)
Glucose-Capillary: 137 mg/dL — ABNORMAL HIGH (ref 70–99)
Glucose-Capillary: 136 mg/dL — ABNORMAL HIGH (ref 70–99)

## 2019-05-24 LAB — BASIC METABOLIC PANEL
Anion gap: 12 (ref 5–15)
BUN: 42 mg/dL — ABNORMAL HIGH (ref 8–23)
CO2: 28 mmol/L (ref 22–32)
Calcium: 9 mg/dL (ref 8.9–10.3)
Chloride: 100 mmol/L (ref 98–111)
Creatinine, Ser: 1.98 mg/dL — ABNORMAL HIGH (ref 0.61–1.24)
GFR calc Af Amer: 40 mL/min — ABNORMAL LOW (ref >60)
GFR calc non Af Amer: 34 mL/min — ABNORMAL LOW (ref >60)
Glucose, Bld: 130 mg/dL — ABNORMAL HIGH (ref 70–99)
Potassium: 4 mmol/L (ref 3.5–5.1)
Sodium: 140 mmol/L (ref 135–145)

## 2019-05-24 LAB — COOXEMETRY PANEL
Carboxyhemoglobin: 1.5 % (ref 0.5–1.5)
Carboxyhemoglobin: 1.6 % — ABNORMAL HIGH (ref 0.5–1.5)
Methemoglobin: 1 % (ref 0.0–1.5)
Methemoglobin: 1 % (ref 0.0–1.5)
O2 Saturation: 45.4 %
O2 Saturation: 51.1 %
Total hemoglobin: 10.5 g/dL — ABNORMAL LOW (ref 12.0–16.0)
Total hemoglobin: 11 g/dL — ABNORMAL LOW (ref 12.0–16.0)

## 2019-05-24 LAB — MAGNESIUM: Magnesium: 1.9 mg/dL (ref 1.7–2.4)

## 2019-05-24 MED ORDER — MILRINONE LACTATE IN DEXTROSE 20-5 MG/100ML-% IV SOLN
0.1250 ug/kg/min | INTRAVENOUS | Status: DC
  Administered 2019-05-24 – 2019-05-26 (×3): 0.125 ug/kg/min via INTRAVENOUS
  Filled 2019-05-24 (×3): qty 100

## 2019-05-24 MED ORDER — DIGOXIN 125 MCG PO TABS
0.1250 mg | ORAL_TABLET | Freq: Every day | ORAL | Status: DC
  Administered 2019-05-24 – 2019-05-29 (×6): 0.125 mg via ORAL
  Filled 2019-05-24 (×6): qty 1

## 2019-05-24 MED ORDER — FUROSEMIDE 10 MG/ML IJ SOLN
80.0000 mg | Freq: Two times a day (BID) | INTRAMUSCULAR | Status: DC
  Administered 2019-05-24 – 2019-05-25 (×2): 80 mg via INTRAVENOUS
  Filled 2019-05-24 (×2): qty 8

## 2019-05-24 NOTE — Progress Notes (Signed)
RT went to see if pt was ready to go on cpap for the night. He wasn't and stated he could place self on when ready.

## 2019-05-24 NOTE — Progress Notes (Signed)
CARDIAC REHAB PHASE I   PRE:  Rate/Rhythm: 85 SR    BP: sitting 116/73    SaO2: 93 RA  MODE:  Ambulation: 1020 ft   POST:  Rate/Rhythm: 109 ST    BP: sitting 135/82     SaO2: 97 RA  Pt eager to walk. Tolerated well, no c/o. Able to talk and walk, very appreciative of his care. Return to bed for nap. Had been up in recliner earlier today. [place], ACSM 05/24/2019 1:56 PM

## 2019-05-24 NOTE — Progress Notes (Signed)
Patient ID: [name], male   DOB: [birth year], 68 y.o.   MRN: [id number]     Advanced Heart Failure Rounding Note  PCP-Cardiologist: Sanda Klein, MD  AHF: Dr. Haroldine Laws EP: Dr. Curt Bears   Subjective:    Says he feels fine. On IV lasix but only modes urine output. Weight unchanged. Co-ox back down to 45%  CVP remain elevated. Remains in NSR on po amio. SBP in 90.    Objective:   Weight Range: 89.3 kg Body mass index is 27.46 kg/m.   Vital Signs:   Temp:  [97.7 F (36.5 C)-98.8 F (37.1 C)] 98.8 F (37.1 C) (12/17 0812) Pulse Rate:  [80-93] 88 (12/17 0812) Resp:  [17-25] 25 (12/17 0812) BP: (98-135)/(64-85) 99/85 (12/17 0812) SpO2:  [98 %-99 %] 99 % (12/17 0349) Weight:  [89.3 kg] 89.3 kg (12/17 0349) Last BM Date: 05/23/19  Weight change: Filed Weights   05/22/19 0206 05/23/19 0500 05/24/19 0349  Weight: 89.3 kg 89.2 kg 89.3 kg    Intake/Output:   Intake/Output Summary (Last 24 hours) at 05/24/2019 0844 Last data filed at 05/24/2019 0415 Gross per 24 hour  Intake 280 ml  Output 1600 ml  Net -1320 ml      Physical Exam    CVP 11-12 General:  Sitting in chair No resp difficulty HEENT: normal Neck: supple. JVP to earCarotids 2+ bilat; no bruits. No lymphadenopathy or thryomegaly appreciated. Cor: PMI laterally displaced. Regular rate & rhythm. 3/6 MR Lungs: clear Abdomen: soft, nontender, nondistended. No hepatosplenomegaly. No bruits or masses. Good bowel sounds. Extremities: no cyanosis, clubbing, rash, 12-+ ankle edema. + TED Neuro: alert & orientedx3, cranial nerves grossly intact. moves all 4 extremities w/o difficulty. Affect pleasant   Telemetry   NSR 80-90s   Personally reviewed  Labs    CBC No results for input(s): WBC, NEUTROABS, HGB, HCT, MCV, PLT in the last 72 hours. Basic Metabolic Panel Recent Labs    05/23/19 0330 05/23/19 0918 05/24/19 0425  NA 139  --  140  K 3.6  --  4.0  CL 101  --  100  CO2 27  --  28  GLUCOSE  118*  --  130*  BUN 35*  --  42*  CREATININE 2.14*  --  1.98*  CALCIUM 8.8*  --  9.0  MG  --  1.8 1.9   Liver Function Tests No results for input(s): AST, ALT, ALKPHOS, BILITOT, PROT, ALBUMIN in the last 72 hours. No results for input(s): LIPASE, AMYLASE in the last 72 hours. Cardiac Enzymes No results for input(s): CKTOTAL, CKMB, CKMBINDEX, TROPONINI in the last 72 hours.  BNP: BNP (last 3 results) Recent Labs    05/16/19 1200  BNP 995.4*    ProBNP (last 3 results) No results for input(s): PROBNP in the last 8760 hours.   D-Dimer No results for input(s): DDIMER in the last 72 hours. Hemoglobin A1C No results for input(s): HGBA1C in the last 72 hours. Fasting Lipid Panel No results for input(s): CHOL, HDL, LDLCALC, TRIG, CHOLHDL, LDLDIRECT in the last 72 hours. Thyroid Function Tests No results for input(s): TSH, T4TOTAL, T3FREE, THYROIDAB in the last 72 hours.  Invalid input(s): FREET3  Other results:   Imaging    No results found.   Medications:     Scheduled Medications: . amiodarone  200 mg Oral BID  . apixaban  5 mg Oral BID  . Chlorhexidine Gluconate Cloth  6 each Topical Daily  . feeding supplement (ENSURE ENLIVE)  237 mL Oral BID BM  . feeding supplement (PRO-STAT SUGAR FREE 64)  30 mL Oral BID  . insulin aspart  0-5 Units Subcutaneous QHS  . insulin aspart  0-9 Units Subcutaneous TID WC  . isosorbide-hydrALAZINE  0.5 tablet Oral TID  . mouth rinse  15 mL Mouth Rinse BID  . metoprolol succinate  12.5 mg Oral BID  . oxybutynin  10 mg Oral Q24H  . pantoprazole  40 mg Oral Daily  . pravastatin  40 mg Oral q1800  . sodium chloride flush  10-40 mL Intracatheter Q12H  . sodium chloride flush  3 mL Intravenous Q12H  . tamsulosin  0.4 mg Oral Daily    Infusions: . sodium chloride Stopped (05/19/19 0318)  . sodium chloride 10 mL/hr at 05/19/19 2300    PRN Medications: sodium chloride, acetaminophen, albuterol, ALPRAZolam, guaiFENesin,  menthol-cetylpyridinium, ondansetron (ZOFRAN) IV, ondansetron, sodium chloride flush, sodium chloride flush, traMADol    Patient Profile   68 y/oM w/ PAF/AFL, HTN, OSA, CKD stage 3b and systolic HF felt due to tachy-induced CM, admitted for PVI. Post procedural course c/b cardiogenic shock requiring intubation + inotrope/ pressor support.    Assessment/Plan   1. Cardiogenic shock: - Off inotropes. Co-ox dropping back down - now 45%. Remains volume overloaded.  - Will start milrinone (may need short course of home inotropes - Continue Bidil 0.5 tab tid.  - Continue Toprol XL 12.5 mg bid.  - Restart digoxin   2. Acute on chronic systolic HF with biventricualr failure due to tachy CM - EF 10% by TEE 12/9 likely due to tachy CM - remains volume up. Coo-ox down.  - As above start milrinone. Continue IV lasix - Add TED hoses for LEE - Restart digoxin - No ACE/ARB/ARNi nor spiro w/AKI and low BP - Continue Bidil + Toprol XL as above.   - hopefully EF with recover with treatment of AF  3. Recurrent AF - s/p successful PVI 12/9. Remains in NSR/ ST.  - Tikosyn off due to AKI and now on PO amiodarone - Continue Eliquis - Continue Toprol XL  - D/w EP personally this am   4. Acute hypoxic respiratory failure.  - extubated. stable  5. AKI on CKD 3b - SCr peaked at 2.45. Suspect AKI 2/2 cardiogenic shock.  - Scr trending back down slightly , 1.68>>1.85>>2.14 -> 1.9 - Monitor closely   6. Urinary retention - Also with severe bladder spasms.  - now w/ AKI (see above) - Renal US 12/15 showed no hydronephrosis.  - continue w/ foley.  - continue Flomax and oxybutynin.  - will need to f/u with urology post discharge  Length of Stay: 7  [name], MD  8:44 AM   Advanced Heart Failure Team Pager [phone] (M-F; 7a - 4p)  Please contact [facility] for night-coverage after hours (4p -7a ) and weekends on [email or web address]  Patient seen and examined with the above-signed Advanced  Practice Provider and/or Housestaff. I personally reviewed laboratory data, imaging studies and relevant notes. I independently examined the patient and formulated the important aspects of the plan. I have edited the note to reflect any of my changes or salient points. I have personally discussed the plan with the patient and/or family.   Now off inotropes but remains tenuous with co-ox 56%. Remains volume overloaded and creatinine going up. Will continue IV lasix but may need to restart inotropes. Maintaining NSR on amio. Tolerating ok. Still with Foley in Place. On Eliquis for [facility] without  bleeding.   On exam  General: Sitting in chair  No resp difficulty HEENT: normal Neck: supple.JVP to jaw. . Carotids 2+ bilat; no bruits. No lymphadenopathy or thryomegaly appreciated. Cor: PMI nondisplaced. Regular rate & rhythm. 2/6 MR Lungs: clear Abdomen: soft, nontender, nondistended. No hepatosplenomegaly. No bruits or masses. Good bowel sounds. Extremities: no cyanosis, clubbing, rash, 2+ ankle edema + foley  Neuro: alert & orientedx3, cranial nerves grossly intact. moves all 4 extremities w/o difficulty. Affect pleasant  Follows closely as above. Continue IV diuresis. If not responding may need to restart inotropes.   [name], MD  8:44 AM

## 2019-05-24 NOTE — Progress Notes (Signed)
  Discussed with Dr. Haroldine Laws and Dr. Curt Bears. Rhythm currently under control s/p PVI with occasional AT on po amiodarone.   EP will follow at a distance for the time being, with stable rhythm and ongoing issues with HF. Note starting back milrinone.   Legrand Como [address]" [place], PA-C  05/24/2019 9:20 AM

## 2019-05-25 LAB — BASIC METABOLIC PANEL
Anion gap: 9 (ref 5–15)
BUN: 37 mg/dL — ABNORMAL HIGH (ref 8–23)
CO2: 26 mmol/L (ref 22–32)
Calcium: 8.9 mg/dL (ref 8.9–10.3)
Chloride: 102 mmol/L (ref 98–111)
Creatinine, Ser: 1.77 mg/dL — ABNORMAL HIGH (ref 0.61–1.24)
GFR calc Af Amer: 45 mL/min — ABNORMAL LOW (ref >60)
GFR calc non Af Amer: 39 mL/min — ABNORMAL LOW (ref >60)
Glucose, Bld: 126 mg/dL — ABNORMAL HIGH (ref 70–99)
Potassium: 3.6 mmol/L (ref 3.5–5.1)
Sodium: 137 mmol/L (ref 135–145)

## 2019-05-25 LAB — GLUCOSE, CAPILLARY
Glucose-Capillary: 139 mg/dL — ABNORMAL HIGH (ref 70–99)
Glucose-Capillary: 228 mg/dL — ABNORMAL HIGH (ref 70–99)
Glucose-Capillary: 130 mg/dL — ABNORMAL HIGH (ref 70–99)
Glucose-Capillary: 170 mg/dL — ABNORMAL HIGH (ref 70–99)
Glucose-Capillary: 175 mg/dL — ABNORMAL HIGH (ref 70–99)

## 2019-05-25 LAB — COOXEMETRY PANEL
Carboxyhemoglobin: 1.9 % — ABNORMAL HIGH (ref 0.5–1.5)
Methemoglobin: 1.1 % (ref 0.0–1.5)
O2 Saturation: 63 %
Total hemoglobin: 10.3 g/dL — ABNORMAL LOW (ref 12.0–16.0)

## 2019-05-25 LAB — MAGNESIUM: Magnesium: 2.1 mg/dL (ref 1.7–2.4)

## 2019-05-25 MED ORDER — POTASSIUM CHLORIDE CRYS ER 20 MEQ PO TBCR
40.0000 meq | EXTENDED_RELEASE_TABLET | Freq: Once | ORAL | Status: AC
  Administered 2019-05-25: 40 meq via ORAL
  Filled 2019-05-25: qty 2

## 2019-05-25 MED ORDER — FUROSEMIDE 80 MG PO TABS
80.0000 mg | ORAL_TABLET | Freq: Every day | ORAL | Status: DC
  Administered 2019-05-25 – 2019-05-29 (×5): 80 mg via ORAL
  Filled 2019-05-25 (×5): qty 1

## 2019-05-25 NOTE — Progress Notes (Addendum)
Patient ID: [name], male   DOB: [birth year], 68 y.o.   MRN: [id number]     Advanced Heart Failure Rounding Note  PCP-Cardiologist: Sanda Klein, MD  AHF: Dr. Haroldine Laws EP: Dr. Curt Bears   Subjective:   Yesterday milrinone restarted at 0.1 mcg. CVP 7-8. CO-OX 63%.   Denies SOB. Able to walk 800 feet.   Objective:   Weight Range: 88 kg Body mass index is 27.06 kg/m.   Vital Signs:   Temp:  [97.9 F (36.6 C)-98.7 F (37.1 C)] 98.1 F (36.7 C) (12/18 1129) Pulse Rate:  [80-89] 86 (12/18 1129) Resp:  [19] 19 (12/18 1129) BP: (104-120)/(67-78) 105/78 (12/18 1129) SpO2:  [95 %-100 %] 100 % (12/18 1129) Weight:  [88 kg] 88 kg (12/18 0330) Last BM Date: 05/23/19  Weight change: Filed Weights   05/23/19 0500 05/24/19 0349 05/25/19 0330  Weight: 89.2 kg 89.3 kg 88 kg    Intake/Output:   Intake/Output Summary (Last 24 hours) at 05/25/2019 1528 Last data filed at 05/25/2019 1132 Gross per 24 hour  Intake 505.97 ml  Output 4150 ml  Net -3644.03 ml      Physical Exam  CVP 3-4 personally checked General:  Well appearing. No resp difficulty HEENT: normal anicteric c Neck: supple. no JVD. Carotids 2+ bilat; no bruits. No lymphadenopathy or thryomegaly appreciated. Cor: PMI nondisplaced. Regular rate & rhythm. 3/6 MR Lungs: clear no wheeze Abdomen: soft, nontender, nondistended. No hepatosplenomegaly. No bruits or masses. Good bowel sounds. Extremities: no cyanosis, clubbing, rash, edema RUE PICC Neuro: alert & oriented x 3, cranial nerves grossly intact. moves all 4 extremities w/o difficulty. Affect pleasant   Telemetry   NSR 80-90s personally checked.   Labs    CBC No results for input(s): WBC, NEUTROABS, HGB, HCT, MCV, PLT in the last 72 hours. Basic Metabolic Panel Recent Labs    05/24/19 0425 05/25/19 0426  NA 140 137  K 4.0 3.6  CL 100 102  CO2 28 26  GLUCOSE 130* 126*  BUN 42* 37*  CREATININE 1.98* 1.77*  CALCIUM 9.0 8.9  MG 1.9 2.1    Liver Function Tests No results for input(s): AST, ALT, ALKPHOS, BILITOT, PROT, ALBUMIN in the last 72 hours. No results for input(s): LIPASE, AMYLASE in the last 72 hours. Cardiac Enzymes No results for input(s): CKTOTAL, CKMB, CKMBINDEX, TROPONINI in the last 72 hours.  BNP: BNP (last 3 results) Recent Labs    05/16/19 1200  BNP 995.4*    ProBNP (last 3 results) No results for input(s): PROBNP in the last 8760 hours.   D-Dimer No results for input(s): DDIMER in the last 72 hours. Hemoglobin A1C No results for input(s): HGBA1C in the last 72 hours. Fasting Lipid Panel No results for input(s): CHOL, HDL, LDLCALC, TRIG, CHOLHDL, LDLDIRECT in the last 72 hours. Thyroid Function Tests No results for input(s): TSH, T4TOTAL, T3FREE, THYROIDAB in the last 72 hours.  Invalid input(s): FREET3  Other results:   Imaging    No results found.   Medications:     Scheduled Medications: . amiodarone  200 mg Oral BID  . apixaban  5 mg Oral BID  . Chlorhexidine Gluconate Cloth  6 each Topical Daily  . digoxin  0.125 mg Oral Daily  . feeding supplement (ENSURE ENLIVE)  237 mL Oral BID BM  . feeding supplement (PRO-STAT SUGAR FREE 64)  30 mL Oral BID  . furosemide  80 mg Intravenous BID  . insulin aspart  0-5 Units Subcutaneous QHS  .  insulin aspart  0-9 Units Subcutaneous TID WC  . isosorbide-hydrALAZINE  0.5 tablet Oral TID  . mouth rinse  15 mL Mouth Rinse BID  . metoprolol succinate  12.5 mg Oral BID  . oxybutynin  10 mg Oral Q24H  . pantoprazole  40 mg Oral Daily  . pravastatin  40 mg Oral q1800  . sodium chloride flush  10-40 mL Intracatheter Q12H  . sodium chloride flush  3 mL Intravenous Q12H  . tamsulosin  0.4 mg Oral Daily    Infusions: . sodium chloride Stopped (05/19/19 0318)  . sodium chloride 10 mL/hr at 05/19/19 2300  . milrinone 0.125 mcg/kg/min (05/24/19 1908)    PRN Medications: sodium chloride, acetaminophen, albuterol, ALPRAZolam, guaiFENesin,  menthol-cetylpyridinium, ondansetron (ZOFRAN) IV, ondansetron, sodium chloride flush, sodium chloride flush, traMADol    Patient Profile   68 y/oM w/ PAF/AFL, HTN, OSA, CKD stage 3b and systolic HF felt due to tachy-induced CM, admitted for PVI. Post procedural course c/b cardiogenic shock requiring intubation + inotrope/ pressor support.    Assessment/Plan   1. Cardiogenic shock: - Off inotropes. Yesterday milrinone restarted at 0.1 mcg. CO-OX improved to 63%.  CVP low. Stop IV lasix.  - Continue Bidil 0.5 tab tid.  - Continue Toprol XL 12.5 mg bid.  - Continue digoxin 0.125 mcg.  - Renal function stable.   2. Acute on chronic systolic HF with biventricualr failure due to tachy CM - EF 10% by TEE 12/9 likely due to tachy CM - Volume status stable. Stop IV lasix. Tomorrow start lasix 80 mg po daily.  - Continue digoxin - No ACE/ARB/ARNi nor spiro w/AKI and low BP - Continue 1/2 tab Bidil - ContinueToprol XL as above.   - hopefully EF with recover with treatment of AF  3. Recurrent AF - s/p successful PVI 12/9.  - Maintaining NSR.   - Tikosyn off due to AKI and now on PO amiodarone - Continue Eliquis - Continue Toprol XL   4. Acute hypoxic respiratory failure.  -Resolved.   5. AKI on CKD 3b - SCr peaked at 2.45. Suspect AKI 2/2 cardiogenic shock.  - Scr trending back down slightly , 1.68>>1.85>>2.14 -> 1.9->1.7  - Check BMEt in am.   6. Urinary retention - Also with severe bladder spasms.  - now w/ AKI (see above) - Renal US 12/15 showed no hydronephrosis.  - continue w/ foley.  - continue Flomax and oxybutynin.  - will need to f/u with urology post discharge  Cardiac Rehab following.   Length of Stay: [place], NP  3:28 PM   Advanced Heart Failure Team Pager [phone] (M-F; 7a - 4p)  Please contact [facility] for night-coverage after hours (4p -7a ) and weekends on [email or web address]  Patient seen and examined with the above-signed Advanced Practice  Provider and/or Housestaff. I personally reviewed laboratory data, imaging studies and relevant notes. I independently examined the patient and formulated the important aspects of the plan. I have edited the note to reflect any of my changes or salient points. I have personally discussed the plan with the patient and/or family.  Milrinone started yesterday. Co-ox improved. Renal function improving. CVP down to 4. Agree with stopping IV lasix and switching to po.  Would continue inotropic support over the weekend and can try repeat wean early next week. May need short course of home inotropes.   Maintaining NSR on po amio. Hgb stable on Eliquis.   Continue to follow closely.   [name], MD  5:32 PM

## 2019-05-25 NOTE — Progress Notes (Signed)
Pt places self on cpap for the night.Resivour filled and O2 on 2 lt.

## 2019-05-25 NOTE — Progress Notes (Signed)
CARDIAC REHAB PHASE I   PRE:  Rate/Rhythm: 96 SR  BP:  Supine:   Sitting: 112/67  Standing:    SaO2: 97%RA  MODE:  Ambulation: 800 ft   POST:  Rate/Rhythm: 114 ST  BP:  Supine:   Sitting: 124/75  Standing:    SaO2: 97%RA 1032-1100 Pt walked 800 ft on RA with rolling walker with steady gait. I managed IV pole. Tolerated well. Sats good on RA. To recliner after walk and no c/o SOB.   Graylon Good, RN BSN  05/25/2019 10:57 AM

## 2019-05-26 LAB — BASIC METABOLIC PANEL
Anion gap: 10 (ref 5–15)
BUN: 33 mg/dL — ABNORMAL HIGH (ref 8–23)
CO2: 29 mmol/L (ref 22–32)
Calcium: 9.1 mg/dL (ref 8.9–10.3)
Chloride: 99 mmol/L (ref 98–111)
Creatinine, Ser: 1.67 mg/dL — ABNORMAL HIGH (ref 0.61–1.24)
GFR calc Af Amer: 49 mL/min — ABNORMAL LOW (ref >60)
GFR calc non Af Amer: 42 mL/min — ABNORMAL LOW (ref >60)
Glucose, Bld: 261 mg/dL — ABNORMAL HIGH (ref 70–99)
Potassium: 3.7 mmol/L (ref 3.5–5.1)
Sodium: 138 mmol/L (ref 135–145)

## 2019-05-26 LAB — GLUCOSE, CAPILLARY
Glucose-Capillary: 114 mg/dL — ABNORMAL HIGH (ref 70–99)
Glucose-Capillary: 210 mg/dL — ABNORMAL HIGH (ref 70–99)
Glucose-Capillary: 150 mg/dL — ABNORMAL HIGH (ref 70–99)
Glucose-Capillary: 178 mg/dL — ABNORMAL HIGH (ref 70–99)

## 2019-05-26 LAB — COOXEMETRY PANEL
Carboxyhemoglobin: 1.9 % — ABNORMAL HIGH (ref 0.5–1.5)
Methemoglobin: 1.1 % (ref 0.0–1.5)
O2 Saturation: 60 %
Total hemoglobin: 10.7 g/dL — ABNORMAL LOW (ref 12.0–16.0)

## 2019-05-26 NOTE — Plan of Care (Signed)

## 2019-05-26 NOTE — Progress Notes (Signed)
Patient ID: [name], male   DOB: [birth year], 68 y.o.   MRN: [id number]     Advanced Heart Failure Rounding Note  PCP-Cardiologist: Sanda Klein, MD  AHF: Dr. Haroldine Laws EP: Dr. Curt Bears   Subjective:    Remains on milrinone 0.125 mcg/kcg/min. Remains in NSR on po amio. Co-ox 60%  Creatinine continues to improve. Now 1.67. Diuresing well. Weight down 5 pounds overnight. Now back to pre-admit weight.   Feels good. Walking halls. Denies CP, orthopnea or PND.   Objective:   Weight Range: 86 kg Body mass index is 26.44 kg/m.   Vital Signs:   Temp:  [98.4 F (36.9 C)-98.7 F (37.1 C)] 98.4 F (36.9 C) (12/19 1107) Pulse Rate:  [74-91] 81 (12/19 1107) Resp:  [19-20] 19 (12/19 1107) BP: (108-120)/(61-82) 118/62 (12/19 1107) SpO2:  [97 %-100 %] 97 % (12/19 1107) Weight:  [86 kg] 86 kg (12/19 0500) Last BM Date: 05/25/19  Weight change: Filed Weights   05/24/19 0349 05/25/19 0330 05/26/19 0500  Weight: 89.3 kg 88 kg 86 kg    Intake/Output:   Intake/Output Summary (Last 24 hours) at 05/26/2019 1351 Last data filed at 05/26/2019 1119 Gross per 24 hour  Intake 487.17 ml  Output 3725 ml  Net -3237.83 ml      Physical Exam   General:  Sitting in chair No resp difficulty HEENT: normal Neck: supple. no JVD. Carotids 2+ bilat; no bruits. No lymphadenopathy or thryomegaly appreciated. Cor: PMI laterally displaced. Regular rate & rhythm. 2/6 MR Lungs: clear Abdomen: soft, nontender, nondistended. No hepatosplenomegaly. No bruits or masses. Good bowel sounds. Extremities: no cyanosis, clubbing, rash, edema Neuro: alert & orientedx3, cranial nerves grossly intact. moves all 4 extremities w/o difficulty. Affect pleasant   Telemetry   NSR 80-90s personally checked.   Labs    CBC No results for input(s): WBC, NEUTROABS, HGB, HCT, MCV, PLT in the last 72 hours. Basic Metabolic Panel Recent Labs    05/24/19 0425 05/25/19 0426 05/26/19 0830  NA 140 137 138  K  4.0 3.6 3.7  CL 100 102 99  CO2 28 26 29   GLUCOSE 130* 126* 261*  BUN 42* 37* 33*  CREATININE 1.98* 1.77* 1.67*  CALCIUM 9.0 8.9 9.1  MG 1.9 2.1  --    Liver Function Tests No results for input(s): AST, ALT, ALKPHOS, BILITOT, PROT, ALBUMIN in the last 72 hours. No results for input(s): LIPASE, AMYLASE in the last 72 hours. Cardiac Enzymes No results for input(s): CKTOTAL, CKMB, CKMBINDEX, TROPONINI in the last 72 hours.  BNP: BNP (last 3 results) Recent Labs    05/16/19 1200  BNP 995.4*    ProBNP (last 3 results) No results for input(s): PROBNP in the last 8760 hours.   D-Dimer No results for input(s): DDIMER in the last 72 hours. Hemoglobin A1C No results for input(s): HGBA1C in the last 72 hours. Fasting Lipid Panel No results for input(s): CHOL, HDL, LDLCALC, TRIG, CHOLHDL, LDLDIRECT in the last 72 hours. Thyroid Function Tests No results for input(s): TSH, T4TOTAL, T3FREE, THYROIDAB in the last 72 hours.  Invalid input(s): FREET3  Other results:   Imaging    No results found.   Medications:     Scheduled Medications: . amiodarone  200 mg Oral BID  . apixaban  5 mg Oral BID  . Chlorhexidine Gluconate Cloth  6 each Topical Daily  . digoxin  0.125 mg Oral Daily  . feeding supplement (ENSURE ENLIVE)  237 mL Oral BID BM  .  feeding supplement (PRO-STAT SUGAR FREE 64)  30 mL Oral BID  . furosemide  80 mg Oral QAC breakfast  . insulin aspart  0-5 Units Subcutaneous QHS  . insulin aspart  0-9 Units Subcutaneous TID WC  . isosorbide-hydrALAZINE  0.5 tablet Oral TID  . mouth rinse  15 mL Mouth Rinse BID  . metoprolol succinate  12.5 mg Oral BID  . oxybutynin  10 mg Oral Q24H  . pantoprazole  40 mg Oral Daily  . pravastatin  40 mg Oral q1800  . sodium chloride flush  10-40 mL Intracatheter Q12H  . sodium chloride flush  3 mL Intravenous Q12H  . tamsulosin  0.4 mg Oral Daily    Infusions: . sodium chloride Stopped (05/19/19 0318)  . sodium chloride 10  mL/hr at 05/19/19 2300  . milrinone 0.125 mcg/kg/min (05/25/19 1900)    PRN Medications: sodium chloride, acetaminophen, albuterol, ALPRAZolam, guaiFENesin, menthol-cetylpyridinium, ondansetron (ZOFRAN) IV, ondansetron, sodium chloride flush, sodium chloride flush, traMADol    Patient Profile   68 y/oM w/ PAF/AFL, HTN, OSA, CKD stage 3b and systolic HF felt due to tachy-induced CM, admitted for PVI. Post procedural course c/b cardiogenic shock requiring intubation + inotrope/ pressor support.    Assessment/Plan   1. Cardiogenic shock: - Back on milrinone at 0.125 due to recurrent shock. Co-ox improved. 60% - Continue Bidil 0.5 tab tid.  - Continue Toprol XL 12.5 mg bid.  - Continue digoxin 0.125 mcg.  - Renal function improved - Will continue milrinone one more day and then try to wean to off tomorrow. If fails wean may need short course of home milrinone  2. Acute on chronic systolic HF with biventricualr failure due to tachy CM - EF 10% by TEE 12/9 likely due to tachy CM - Cardiac output improved on milrinone - Volume status now back to baseline. On po lasix - Continue digoxin - No ACE/ARB/ARNi nor spiro w/AKI and low BP - Continue 1/2 tab Bidil - Continue Toprol XL as above.   - hopefully EF with recover with treatment of AF  3. Recurrent AF - s/p successful PVI 12/9.  - Maintaining NSR on po amio - Tikosyn off due to AKI and now on PO amiodarone - Continue Eliquis - Continue Toprol XL   4. Acute hypoxic respiratory failure.  -Resolved.   5. AKI on CKD 3b - SCr peaked at 2.45. Suspect AKI 2/2 cardiogenic shock.  - Scr trending back down slightly , 1.68>>1.85>>2.14 -> 1.9->1.7 > 1.67 - Watch BMET daily  6. Urinary retention - Also with severe bladder spasms.  - now w/ AKI (see above) - Renal US 12/15 showed no hydronephrosis.  - continue w/ foley.  - continue Flomax and oxybutynin.  - will need to f/u with urology post discharge  Cardiac Rehab following.    Length of Stay: 9  [name], MD  1:51 PM   Advanced Heart Failure Team Pager [phone] (M-F; 7a - 4p)  Please contact [facility] for night-coverage after hours (4p -7a ) and weekends on [email or web address]

## 2019-05-26 NOTE — Progress Notes (Signed)
CARDIAC REHAB PHASE I   PRE:  Rate/Rhythm: 83 SR  BP:  Supine: 113/68  Sitting:   Standing:    SaO2: 94%RA  MODE:  Ambulation: 900 ft   POST:  Rate/Rhythm: 108 ST  BP:  Supine:   Sitting: 130/77  Standing:    SaO2: 97%RA [id number] Pt ready to walk. Pt walked 900 ft on RA using walker and I managed IV pole. Tolerated well. No DOE noted. To recliner after walk. Encouraged pt to walk with staff for more walks.    Graylon Good, RN BSN  05/26/2019 9:53 AM

## 2019-05-27 LAB — BASIC METABOLIC PANEL
Anion gap: 10 (ref 5–15)
BUN: 32 mg/dL — ABNORMAL HIGH (ref 8–23)
CO2: 26 mmol/L (ref 22–32)
Calcium: 9.2 mg/dL (ref 8.9–10.3)
Chloride: 100 mmol/L (ref 98–111)
Creatinine, Ser: 1.45 mg/dL — ABNORMAL HIGH (ref 0.61–1.24)
GFR calc Af Amer: 58 mL/min — ABNORMAL LOW (ref >60)
GFR calc non Af Amer: 50 mL/min — ABNORMAL LOW (ref >60)
Glucose, Bld: 164 mg/dL — ABNORMAL HIGH (ref 70–99)
Potassium: 3.8 mmol/L (ref 3.5–5.1)
Sodium: 136 mmol/L (ref 135–145)

## 2019-05-27 LAB — COOXEMETRY PANEL
Carboxyhemoglobin: 1.6 % — ABNORMAL HIGH (ref 0.5–1.5)
Methemoglobin: 0.4 % (ref 0.0–1.5)
O2 Saturation: 68.1 %
Total hemoglobin: 9.7 g/dL — ABNORMAL LOW (ref 12.0–16.0)

## 2019-05-27 LAB — GLUCOSE, CAPILLARY
Glucose-Capillary: 127 mg/dL — ABNORMAL HIGH (ref 70–99)
Glucose-Capillary: 177 mg/dL — ABNORMAL HIGH (ref 70–99)
Glucose-Capillary: 224 mg/dL — ABNORMAL HIGH (ref 70–99)
Glucose-Capillary: 182 mg/dL — ABNORMAL HIGH (ref 70–99)

## 2019-05-27 NOTE — Plan of Care (Signed)

## 2019-05-27 NOTE — Progress Notes (Addendum)
Patient ID: [name], male   DOB: Jun 30, 1950, 68 y.o.   MRN: [id number]     Advanced Heart Failure Rounding Note  PCP-Cardiologist: Sanda Klein, MD  AHF: Dr. Haroldine Laws EP: Dr. Curt Bears   Subjective:    Remains on milrinone 0.125 mcg/kcg/min. Remains in NSR on po amio but having very frequent PACs. Co-ox 68% CVP 4-5  Creatinine improving Now 1.45   Denies CP, SOB, orthopnea or PND.    Objective:   Weight Range: 85.7 kg Body mass index is 26.35 kg/m.   Vital Signs:   Temp:  [97.5 F (36.4 C)-98.8 F (37.1 C)] 98.8 F (37.1 C) (12/20 0746) Pulse Rate:  [81-106] 88 (12/20 0746) Resp:  [12-19] 15 (12/20 0319) BP: (113-131)/(62-87) 122/83 (12/20 0746) SpO2:  [94 %-100 %] 100 % (12/20 0746) Weight:  [85.7 kg] 85.7 kg (12/20 0319) Last BM Date: 05/26/19  Weight change: Filed Weights   05/25/19 0330 05/26/19 0500 05/27/19 0319  Weight: 88 kg 86 kg 85.7 kg    Intake/Output:   Intake/Output Summary (Last 24 hours) at 05/27/2019 0907 Last data filed at 05/27/2019 0757 Gross per 24 hour  Intake 300 ml  Output 1800 ml  Net -1500 ml      Physical Exam   General:  Sitting up in bed . No resp difficulty HEENT: normal Neck: supple. no JVD. Carotids 2+ bilat; no bruits. No lymphadenopathy or thryomegaly appreciated. Cor: PMI laterally displaced. Regular rate & rhythm. 2/6 MR Lungs: clear Abdomen: soft, nontender, nondistended. No hepatosplenomegaly. No bruits or masses. Good bowel sounds. Extremities: no cyanosis, clubbing, rash, edema Neuro: alert & orientedx3, cranial nerves grossly intact. moves all 4 extremities w/o difficulty. Affect pleasant   Telemetry   NSR 80-90s with frequent PACs personally checked.   Labs    CBC No results for input(s): WBC, NEUTROABS, HGB, HCT, MCV, PLT in the last 72 hours. Basic Metabolic Panel Recent Labs    05/25/19 0426 05/26/19 0830 05/27/19 0412  NA 137 138 136  K 3.6 3.7 3.8  CL 102 99 100  CO2 26 29 26     GLUCOSE 126* 261* 164*  BUN 37* 33* 32*  CREATININE 1.77* 1.67* 1.45*  CALCIUM 8.9 9.1 9.2  MG 2.1  --   --    Liver Function Tests No results for input(s): AST, ALT, ALKPHOS, BILITOT, PROT, ALBUMIN in the last 72 hours. No results for input(s): LIPASE, AMYLASE in the last 72 hours. Cardiac Enzymes No results for input(s): CKTOTAL, CKMB, CKMBINDEX, TROPONINI in the last 72 hours.  BNP: BNP (last 3 results) Recent Labs    05/16/19 1200  BNP 995.4*    ProBNP (last 3 results) No results for input(s): PROBNP in the last 8760 hours.   D-Dimer No results for input(s): DDIMER in the last 72 hours. Hemoglobin A1C No results for input(s): HGBA1C in the last 72 hours. Fasting Lipid Panel No results for input(s): CHOL, HDL, LDLCALC, TRIG, CHOLHDL, LDLDIRECT in the last 72 hours. Thyroid Function Tests No results for input(s): TSH, T4TOTAL, T3FREE, THYROIDAB in the last 72 hours.  Invalid input(s): FREET3  Other results:   Imaging    No results found.   Medications:     Scheduled Medications: . amiodarone  200 mg Oral BID  . apixaban  5 mg Oral BID  . Chlorhexidine Gluconate Cloth  6 each Topical Daily  . digoxin  0.125 mg Oral Daily  . feeding supplement (ENSURE ENLIVE)  237 mL Oral BID BM  .  feeding supplement (PRO-STAT SUGAR FREE 64)  30 mL Oral BID  . furosemide  80 mg Oral QAC breakfast  . insulin aspart  0-5 Units Subcutaneous QHS  . insulin aspart  0-9 Units Subcutaneous TID WC  . isosorbide-hydrALAZINE  0.5 tablet Oral TID  . mouth rinse  15 mL Mouth Rinse BID  . metoprolol succinate  12.5 mg Oral BID  . oxybutynin  10 mg Oral Q24H  . pantoprazole  40 mg Oral Daily  . pravastatin  40 mg Oral q1800  . sodium chloride flush  10-40 mL Intracatheter Q12H  . sodium chloride flush  3 mL Intravenous Q12H  . tamsulosin  0.4 mg Oral Daily    Infusions: . sodium chloride Stopped (05/19/19 0318)  . sodium chloride 10 mL/hr at 05/19/19 2300  . milrinone 0.125  mcg/kg/min (05/26/19 1901)    PRN Medications: sodium chloride, acetaminophen, albuterol, ALPRAZolam, guaiFENesin, menthol-cetylpyridinium, ondansetron (ZOFRAN) IV, ondansetron, sodium chloride flush, sodium chloride flush, traMADol    Patient Profile   68 y/oM w/ PAF/AFL, HTN, OSA, CKD stage 3b and systolic HF felt due to tachy-induced CM, admitted for PVI. Post procedural course c/b cardiogenic shock requiring intubation + inotrope/ pressor support.    Assessment/Plan   1. Cardiogenic shock: - Back on milrinone at 0.125 due to recurrent shock. Co-ox improved. 68% this am.  - Continue Bidil 0.5 tab tid.  - Continue Toprol XL 12.5 mg bid.  - Continue digoxin 0.125 mcg.  - Renal function continues to improve.  - Will stop milrinone today and follow for 24-48 hours. If co-ox drops back down or renal function worsening may need short-period of home inotropes - Repeat limited echo in am  2. Acute on chronic systolic HF with biventricualr failure due to tachy CM - EF 10% by TEE 12/9 likely due to tachy CM - Cardiac output improved on milrinone. AS above, wil stop today - Volume status now back to baseline. On po lasix - Continue digoxin - No ACE/ARB/ARNi nor spiro w/AKI and low BP - Continue 1/2 tab Bidil - Continue Toprol XL as above.   - hopefully EF with recover with treatment of AF. Limited echo tomorrow  3. Recurrent AF - s/p successful PVI 12/9.  - Maintaining NSR on po amio. Having frequent PACs. Hopefully  Will improve with stopping milrinone - Tikosyn off due to AKI and now on PO amiodarone - Continue Eliquis - Continue Toprol XL   4. Acute hypoxic respiratory failure.  -Resolved.   5. AKI on CKD 3b - SCr peaked at 2.45. Suspect AKI 2/2 cardiogenic shock.  - Scr trending back down slightly , 1.68>>1.85>>2.14 -> > 1.67> 1.45 - Watch BMET daily  6. Urinary retention - Also with severe bladder spasms.  - now w/ AKI (see above) - Renal US 12/15 showed no  hydronephrosis.  - continue w/ foley.  - continue Flomax and oxybutynin.  - will need to f/u with urology post discharge  Cardiac Rehab following.   Length of Stay: 10  [name], MD  9:07 AM   Advanced Heart Failure Team Pager [phone] (M-F; 7a - 4p)  Please contact [facility] for night-coverage after hours (4p -7a ) and weekends on [email or web address]

## 2019-05-28 ENCOUNTER — Inpatient Hospital Stay (HOSPITAL_COMMUNITY): Payer: Medicare Other

## 2019-05-28 DIAGNOSIS — I5023 Acute on chronic systolic (congestive) heart failure: Secondary | ICD-10-CM

## 2019-05-28 LAB — CBC
HCT: 37 % — ABNORMAL LOW (ref 39.0–52.0)
Hemoglobin: 11.2 g/dL — ABNORMAL LOW (ref 13.0–17.0)
MCH: 26.1 pg (ref 26.0–34.0)
MCHC: 30.3 g/dL (ref 30.0–36.0)
MCV: 86.2 fL (ref 80.0–100.0)
Platelets: 342 10*3/uL (ref 150–400)
RBC: 4.29 MIL/uL (ref 4.22–5.81)
RDW: 16.2 % — ABNORMAL HIGH (ref 11.5–15.5)
WBC: 4.6 10*3/uL (ref 4.0–10.5)
nRBC: 0 % (ref 0.0–0.2)

## 2019-05-28 LAB — BASIC METABOLIC PANEL
Anion gap: 10 (ref 5–15)
BUN: 32 mg/dL — ABNORMAL HIGH (ref 8–23)
CO2: 26 mmol/L (ref 22–32)
Calcium: 9.4 mg/dL (ref 8.9–10.3)
Chloride: 102 mmol/L (ref 98–111)
Creatinine, Ser: 1.45 mg/dL — ABNORMAL HIGH (ref 0.61–1.24)
GFR calc Af Amer: 58 mL/min — ABNORMAL LOW (ref >60)
GFR calc non Af Amer: 50 mL/min — ABNORMAL LOW (ref >60)
Glucose, Bld: 155 mg/dL — ABNORMAL HIGH (ref 70–99)
Potassium: 3.9 mmol/L (ref 3.5–5.1)
Sodium: 138 mmol/L (ref 135–145)

## 2019-05-28 LAB — GLUCOSE, CAPILLARY
Glucose-Capillary: 150 mg/dL — ABNORMAL HIGH (ref 70–99)
Glucose-Capillary: 250 mg/dL — ABNORMAL HIGH (ref 70–99)
Glucose-Capillary: 178 mg/dL — ABNORMAL HIGH (ref 70–99)
Glucose-Capillary: 196 mg/dL — ABNORMAL HIGH (ref 70–99)

## 2019-05-28 LAB — MAGNESIUM: Magnesium: 2 mg/dL (ref 1.7–2.4)

## 2019-05-28 LAB — ECHOCARDIOGRAM LIMITED
Height: 71 in
Weight: 3156.99 oz

## 2019-05-28 LAB — COOXEMETRY PANEL
Carboxyhemoglobin: 1.9 % — ABNORMAL HIGH (ref 0.5–1.5)
Methemoglobin: 1 % (ref 0.0–1.5)
O2 Saturation: 71.3 %
Total hemoglobin: 10.9 g/dL — ABNORMAL LOW (ref 12.0–16.0)

## 2019-05-28 NOTE — Progress Notes (Signed)
Pt states he comfortable placing himself on CPAP.  Sterile water added to reservoir.  3L 02 bleed in.

## 2019-05-28 NOTE — Progress Notes (Signed)
CARDIAC REHAB PHASE I   PRE:  Rate/Rhythm: 87 SR  BP:  Supine:   Sitting: 115/73  Standing:    SaO2: 96%RA  MODE:  Ambulation: 1600 ft   POST:  Rate/Rhythm: 102 ST PVCs  BP:  Supine:   Sitting: 133/74  Standing:    SaO2: 96%RA 0834-0910 Pt very motivated to walk and increase distance safely. Pt walked 1600 ft on RA with rolling walker and I managed IV pole. Gait steady and no SOB noted. Tolerated well.   Graylon Good, RN BSN  05/28/2019 9:07 AM

## 2019-05-28 NOTE — Progress Notes (Addendum)
Patient ID: [name], male   DOB: 04-Feb-1951, 68 y.o.   MRN: [id number]     Advanced Heart Failure Rounding Note  PCP-Cardiologist: Sanda Klein, MD  AHF: Dr. Haroldine Laws EP: Dr. Curt Bears   Subjective:   Yesterday milrinone stopped. CO-OX stable 71%.   Denies SOB.   Objective:   Weight Range: 89.5 kg Body mass index is 27.52 kg/m.   Vital Signs:   Temp:  [97.7 F (36.5 C)-98.6 F (37 C)] 98.5 F (36.9 C) (12/21 0739) Pulse Rate:  [83-87] 83 (12/21 0739) Resp:  [12-19] 19 (12/21 0739) BP: (105-119)/(67-79) 115/73 (12/21 0739) SpO2:  [96 %-99 %] 96 % (12/21 0739) Weight:  [89.5 kg] 89.5 kg (12/21 0500) Last BM Date: 05/27/19(two BM)  Weight change: Filed Weights   05/26/19 0500 05/27/19 0319 05/28/19 0500  Weight: 86 kg 85.7 kg 89.5 kg    Intake/Output:   Intake/Output Summary (Last 24 hours) at 05/28/2019 1055 Last data filed at 05/27/2019 2300 Gross per 24 hour  Intake 270 ml  Output 1450 ml  Net -1180 ml      Physical Exam  General:  Sitting in the chair.  No resp difficulty HEENT: normal Neck: supple. no JVD. Carotids 2+ bilat; no bruits. No lymphadenopathy or thryomegaly appreciated. Cor: PMI nondisplaced. Regular rate & rhythm. No rubs, gallops. 2/6 MR . Lungs: clear Abdomen: soft, nontender, nondistended. No hepatosplenomegaly. No bruits or masses. Good bowel sounds. Extremities: no cyanosis, clubbing, rash, edema. RUE PICC Neuro: alert & orientedx3, cranial nerves grossly intact. moves all 4 extremities w/o difficulty. Affect pleasant   Telemetry   NSR 80-90s PACs    Labs    CBC Recent Labs    05/28/19 0707  WBC 4.6  HGB 11.2*  HCT 37.0*  MCV 86.2  PLT [id number]   Basic Metabolic Panel Recent Labs    05/27/19 0412 05/28/19 0707  NA 136 138  K 3.8 3.9  CL 100 102  CO2 26 26  GLUCOSE 164* 155*  BUN 32* 32*  CREATININE 1.45* 1.45*  CALCIUM 9.2 9.4   Liver Function Tests No results for input(s): AST, ALT, ALKPHOS, BILITOT,  PROT, ALBUMIN in the last 72 hours. No results for input(s): LIPASE, AMYLASE in the last 72 hours. Cardiac Enzymes No results for input(s): CKTOTAL, CKMB, CKMBINDEX, TROPONINI in the last 72 hours.  BNP: BNP (last 3 results) Recent Labs    05/16/19 1200  BNP 995.4*    ProBNP (last 3 results) No results for input(s): PROBNP in the last 8760 hours.   D-Dimer No results for input(s): DDIMER in the last 72 hours. Hemoglobin A1C No results for input(s): HGBA1C in the last 72 hours. Fasting Lipid Panel No results for input(s): CHOL, HDL, LDLCALC, TRIG, CHOLHDL, LDLDIRECT in the last 72 hours. Thyroid Function Tests No results for input(s): TSH, T4TOTAL, T3FREE, THYROIDAB in the last 72 hours.  Invalid input(s): FREET3  Other results:   Imaging    No results found.   Medications:     Scheduled Medications: . amiodarone  200 mg Oral BID  . apixaban  5 mg Oral BID  . Chlorhexidine Gluconate Cloth  6 each Topical Daily  . digoxin  0.125 mg Oral Daily  . feeding supplement (ENSURE ENLIVE)  237 mL Oral BID BM  . feeding supplement (PRO-STAT SUGAR FREE 64)  30 mL Oral BID  . furosemide  80 mg Oral QAC breakfast  . insulin aspart  0-5 Units Subcutaneous QHS  . insulin aspart  0-9 Units Subcutaneous TID WC  . isosorbide-hydrALAZINE  0.5 tablet Oral TID  . mouth rinse  15 mL Mouth Rinse BID  . metoprolol succinate  12.5 mg Oral BID  . oxybutynin  10 mg Oral Q24H  . pantoprazole  40 mg Oral Daily  . pravastatin  40 mg Oral q1800  . sodium chloride flush  10-40 mL Intracatheter Q12H  . sodium chloride flush  3 mL Intravenous Q12H  . tamsulosin  0.4 mg Oral Daily    Infusions: . sodium chloride Stopped (05/19/19 0318)  . sodium chloride 10 mL/hr at 05/19/19 2300    PRN Medications: sodium chloride, acetaminophen, albuterol, ALPRAZolam, guaiFENesin, menthol-cetylpyridinium, ondansetron (ZOFRAN) IV, ondansetron, sodium chloride flush, sodium chloride flush,  traMADol    Patient Profile   68 y/oM w/ PAF/AFL, HTN, OSA, CKD stage 3b and systolic HF felt due to tachy-induced CM, admitted for PVI. Post procedural course c/b cardiogenic shock requiring intubation + inotrope/ pressor support.    Assessment/Plan   1. Cardiogenic shock: - CO-OX stable off milrinone.  - Continue Bidil 0.5 tab tid.  - Continue Toprol XL 12.5 mg bid.  - Continue digoxin 0.125 mcg.  - Renal function continues to improve.  - Check limited ECHO -  2. Acute on chronic systolic HF with biventricualr failure due to tachy CM - EF 10% by TEE 12/9 likely due to tachy CM - Stable off milrinone 71%.  - On po lasix - Continue digoxin - No ACE/ARB/ARNi nor spiro w/AKI and low BP - Continue 1/2 tab Bidil - Continue Toprol XL as above.   -  Limited echo today.   3. Recurrent AF - s/p successful PVI 12/9.  - Tikosyn off due to AKI and now on PO amiodarone - Maintaining NSR  - Continue Eliquis - Continue Toprol XL   4. Acute hypoxic respiratory failure.  -Resolved.   5. AKI on CKD 3b - SCr peaked at 2.45. Suspect AKI 2/2 cardiogenic shock.  - Creatinine back down to 1.45.   6. Urinary retention - Also with severe bladder spasms.  - now w/ AKI (see above) - Renal US 12/15 showed no hydronephrosis.  - continue w/ foley.  - continue Flomax and oxybutynin.  - will need to f/u with urology post discharge. I have called to set up follow up today. [facility] will call him to set up f/u. Continue foley at discharge.     Length of Stay: [place], NP  10:55 AM   Advanced Heart Failure Team Pager [phone] (M-F; 7a - 4p)  Please contact [facility] for night-coverage after hours (4p -7a ) and weekends on [email or web address]   Patient seen and examined with the above-signed Advanced Practice Provider and/or Housestaff. I personally reviewed laboratory data, imaging studies and relevant notes. I independently examined the patient and formulated the important  aspects of the plan. I have edited the note to reflect any of my changes or salient points. I have personally discussed the plan with the patient and/or family.  Milrinone stopped yesterday. Feels ok. Co-ox stable at 71% Volume status much improved.  Maintaining NSR on amio.  Echo done today off milrinone EF 35-40% Trivial MR  General:  Well appearing. No resp difficulty HEENT: normal Neck: supple. no JVD. Carotids 2+ bilat; no bruits. No lymphadenopathy or thryomegaly appreciated. Cor: PMI nondisplaced. Regular rate & rhythm. No rubs, gallops or murmurs. Lungs: clear Abdomen: soft, nontender, nondistended. No hepatosplenomegaly. No bruits or masses. Good bowel sounds. Extremities:  no cyanosis, clubbing, rash, edema Neuro: alert & orientedx3, cranial nerves grossly intact. moves all 4 extremities w/o difficulty. Affect pleasant  EF improved but still not normal. Volume status ok. Continue digoxin and Bidll for now. Add spiro.  Will need voiding trials as outpatient.   [name], MD  5:05 PM

## 2019-05-28 NOTE — Progress Notes (Signed)
Pt places self on CPAP. Reservoir filled.

## 2019-05-28 NOTE — Progress Notes (Signed)
  Echocardiogram 2D Echocardiogram has been performed.  [name] [name] 05/28/2019, 3:17 PM

## 2019-05-28 NOTE — Discharge Summary (Addendum)
Advanced Heart Failure Team  Discharge Summary   Patient ID: [name] MRN: [id number], DOB/AGE: July 02, 1950 68 y.o. Admit date: 05/16/2019 D/C date:     05/29/2019   Primary Discharge Diagnoses:  1. Cardiogenic shock: 2. 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 Recurrent AF - s/p successful PVI 12/9.  4. Acute hypoxic respiratory failure.  5. AKI on CKD 3b 6. Urinary retention   Hospital Course:  Mr Iorio is a 68 year old with history of PAF/AFL, HTN, OSA, CKD 3b and systolic HF felt due to tachy-induced CM.  Admitted for PVI with successful procedure. Post procedure he developed cardiogenic shock and respiratory failure. CCM consulted for intubation. Advanced HF team consulted. As he improved he was extubated.  Treated with pressors and gradually weaned off. HF meds initiated. ECHO initially showed EF 10% but improved 35-40% prior to discharge.   See below for detailed problems list. He will contine to be followed closely in the HF clinic.    1. Cardiogenic shock: Initially on pressors after PVI. Pressors gradually weaned off.  - HF meds started.  - Continue Toprol XL 12.5 mg bid.  - Continue digoxin 0.125 mcg. Stop digoxin at his follow up appointment.  - Renal function continues to improve.  - Checked limited ECHO on 05/28/19 EF improving.  -  2. 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  - Diuresed with IV lasix and transitioned to lasix 80 mg daily.  - Continue digoxin for now. Stop at his follow up.  - No ACE/ARB/ARNi nor spiro w/AKI and low BP - Discharged on hydralazine 25 mg three times a day + imdur 30 mg daily.  - Continue Toprol XL as above.     3. Recurrent AF - s/p successful PVI 12/9.  - Tikosyn off due to AKI and now on PO amiodarone. Plan to continue. -  Maintaining NSR   - Continue Eliquis - Continue Toprol XL  - Follow up in EP clinic   4. Acute hypoxic respiratory failure.  -Required intubation after PVI. Extubated 05/17/19.  -Improved and was stable on room air.    5. AKI on CKD 3b - SCr peaked at 2.45. Suspect AKI 2/2 cardiogenic shock.  - Creatinine back down to 1.45.    6. Urinary retention - Also with severe bladder spasms.  - now w/ AKI (see above) - Renal US 12/15 showed no hydronephrosis.  - continue w/ foley.  - continue Flomax and oxybutynin.  - will need to f/u with urology post discharge. I have called to set up follow up today. [facility] will call him to set up f/u. Continue foley at discharge.   Discharge Weight: 188 pounds.  Discharge Vitals: Blood pressure 120/73, pulse 83, temperature (!) 97.4 F (36.3 C), temperature source Oral, resp. rate 15, height _0  (1.803 m), weight 85.2 kg, SpO2 99 %.  Labs: Lab Results  Component Value Date   WBC 3.7 (L) 05/29/2019   HGB 10.5 (L) 05/29/2019   HCT 35.0 (L) 05/29/2019   MCV 85.6 05/29/2019   PLT 339 05/29/2019    Recent Labs  Lab 05/29/19 0530  NA 139  K 3.7  CL 101  CO2 29  BUN 30*  CREATININE 1.61*  CALCIUM 9.2  GLUCOSE 141*   Lab Results  Component Value Date   CHOL 116 02/06/2019   HDL 32.70 (L) 02/06/2019   LDLCALC 63 02/06/2019   TRIG 102.0 02/06/2019   BNP (last 3 results) Recent Labs    05/16/19 1200  BNP 995.4*    ProBNP (last 3 results) No results for input(s): PROBNP in the last 8760 hours.   Diagnostic Studies/Procedures   ECHOCARDIOGRAM LIMITED  Result Date: 05/28/2019   ECHOCARDIOGRAM REPORT   Patient Name:   SUDAIS BANGHART Date of Exam: 05/28/2019 Medical Rec #:  [id number]        Height:       71.0 in Accession #:    [id number]       Weight:       197.3 lb Date of Birth:  [birth year]         BSA:          2.10 m Patient Age:    68 years         BP:           114/63 mmHg Patient Gender: M                HR:           70 bpm. Exam  Location:  Inpatient Procedure: Limited Echo Indications:    Reason for exam-Echo Congestive Heart Failure 428.0 / I50.9  History:        Patient has prior history of Echocardiogram examinations, most                 recent 05/16/2019. Arrythmias:Atrial Fibrillation,                 Signs/Symptoms:Chest Pain; Risk Factors:Dyslipidemia, Diabetes                 and Sleep Apnea.  Sonographer:    Vikki Ports Turrentine Referring Phys: [phone] AMY D CLEGG IMPRESSIONS  1. Left ventricular ejection fraction, by visual estimation, is 40 to 45%. The left ventricle has moderately decreased function. There is mildly increased left ventricular hypertrophy.  2. Left ventricular diastolic parameters are indeterminate.  3. The left ventricle demonstrates global hypokinesis.  4. Global right ventricle has normal systolic function.The right ventricular size is normal. No increase in right ventricular wall thickness.  5. Left atrial size was normal.  6. Right atrial size was normal.  7. The mitral valve is normal in structure. Trivial mitral valve regurgitation. No evidence of mitral stenosis.  8. The tricuspid valve is normal in structure. Tricuspid valve regurgitation is trivial.  9. Aortic valve regurgitation is moderate. 10. The aortic valve is normal in structure. Aortic valve regurgitation is moderate. No evidence of aortic valve sclerosis or stenosis. 11. Pulmonic regurgitation is moderate. 12. The pulmonic valve was normal in structure. Pulmonic valve regurgitation is moderate. 13. The inferior vena cava is normal in size with greater than 50% respiratory variability, suggesting right atrial pressure of 3 mmHg. FINDINGS  Left Ventricle: Left ventricular ejection fraction, by visual estimation, is 40 to 45%. The left ventricle has moderately decreased function. The left ventricle demonstrates global hypokinesis. There is mildly increased left ventricular hypertrophy. Left ventricular diastolic parameters are indeterminate. Normal  left atrial pressure. Right Ventricle: The right ventricular size is normal. No increase in right ventricular wall thickness. Global RV systolic function is has normal systolic function. Left Atrium: Left atrial size was normal in size. Right Atrium: Right atrial size was normal in size Pericardium: There is no evidence of pericardial effusion. Mitral Valve:  The mitral valve is normal in structure. Trivial mitral valve regurgitation. No evidence of mitral valve stenosis by observation. Tricuspid Valve: The tricuspid valve is normal in structure. Tricuspid valve regurgitation is trivial. Aortic Valve: The aortic valve is normal in structure.. There is mild thickening and mild calcification of the aortic valve. Aortic valve regurgitation is moderate. Aortic regurgitation PHT measures 562 msec. The aortic valve is structurally normal, with  no evidence of sclerosis or stenosis. There is mild thickening of the aortic valve. There is mild calcification of the aortic valve. Pulmonic Valve: The pulmonic valve was normal in structure. Pulmonic valve regurgitation is moderate. Pulmonic regurgitation is moderate. Aorta: The aortic root, ascending aorta and aortic arch are all structurally normal, with no evidence of dilitation or obstruction. Venous: The inferior vena cava is normal in size with greater than 50% respiratory variability, suggesting right atrial pressure of 3 mmHg. IAS/Shunts: No atrial level shunt detected by color flow Doppler. There is no evidence of a patent foramen ovale. No ventricular septal defect is seen or detected. There is no evidence of an atrial septal defect.  LEFT VENTRICLE PLAX 2D LVIDd:         4.88 cm LVIDs:         4.00 cm LV PW:         1.25 cm LV IVS:        1.29 cm LV SV:         42 ml LV SV Index:   19.57  LV Volumes (MOD) LV area d, A2C:    34.90 cm LV area d, A4C:    35.20 cm LV area s, A2C:    25.50 cm LV area s, A4C:    24.20 cm LV major d, A2C:   9.01 cm LV major d, A4C:   8.56  cm LV major s, A2C:   8.55 cm LV major s, A4C:   7.96 cm LV vol d, MOD A2C: 114.0 ml LV vol d, MOD A4C: 122.0 ml LV vol s, MOD A2C: 64.2 ml LV vol s, MOD A4C: 63.8 ml LV SV MOD A2C:     49.8 ml LV SV MOD A4C:     122.0 ml LV SV MOD BP:      54.9 ml LEFT ATRIUM         Index LA diam:    4.40 cm 2.10 cm/m  AORTIC VALVE LVOT Vmax:   115.00 cm/s LVOT Vmean:  83.400 cm/s LVOT VTI:    0.199 m AI PHT:      562 msec  AORTA Ao Root diam: 3.50 cm  SHUNTS Systemic VTI: 0.20 m  Candee Furbish MD Electronically signed by Candee Furbish MD Signature Date/Time: 05/28/2019/4:43:52 PM    Final     Discharge Medications   Allergies as of 05/29/2019       Reactions   Sulfonamide Derivatives Other (See Comments)   unknown reaction.  Patient states had to go to the ER.    Zolpidem Other (See Comments)   Excessive, prolonged sedation        Medication List     STOP taking these medications    benazepril 5 MG tablet Commonly known as: LOTENSIN   dofetilide 500 MCG capsule Commonly known as: TIKOSYN       TAKE these medications    albuterol 108 (90 Base) MCG/ACT inhaler Commonly known as: Ventolin HFA Inhale 2 puffs into the lungs every 6 (six) hours as needed for wheezing or shortness of  breath.   allopurinol 300 MG tablet Commonly known as: ZYLOPRIM Take 1 tablet by mouth once daily   amiodarone 200 MG tablet Commonly known as: PACERONE Take 1 tablet (200 mg total) by mouth 2 (two) times daily.   calcium carbonate 500 MG chewable tablet Commonly known as: TUMS - dosed in mg elemental calcium Chew 1 tablet by mouth 3 (three) times daily as needed (heartburn/indigestion.).   cetirizine 10 MG tablet Commonly known as: ZYRTEC Take 1 tablet (10 mg total) by mouth daily.   Clobetasol Prop Emollient Base 0.05 % emollient cream Commonly known as: Clobetasol Propionate E Apply 1 application topically 2 (two) times daily as needed (insect bite).   colchicine 0.6 MG tablet Commonly known as:  Colcrys TAKE ONE TABLET BY MOUTH DAILY AS NEEDED FOR GOUT FLARE UPS.   digoxin 0.125 MG tablet Commonly known as: LANOXIN Take 0.5 tablets (0.0625 mg total) by mouth daily. Start taking on: May 30, 2019   Eliquis 5 MG Tabs tablet Generic drug: apixaban Take 1 tablet by mouth twice daily   fish oil-omega-3 fatty acids 1000 MG capsule Take 1 g by mouth daily.   furosemide 80 MG tablet Commonly known as: LASIX Take 1 tablet (80 mg total) by mouth daily before breakfast. Start taking on: May 30, 2019 What changed:  medication strength See the new instructions.   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   hydrALAZINE 25 MG tablet Commonly known as: APRESOLINE Take 1 tablet (25 mg total) by mouth 3 (three) times daily.   HYDROcodone-acetaminophen 10-325 MG tablet Commonly known as: NORCO Take 1 tablet by mouth every 8 (eight) hours as needed for moderate pain or severe pain.   isosorbide mononitrate 30 MG 24 hr tablet Commonly known as: IMDUR Take 1 tablet (30 mg total) by mouth daily.   LORazepam 1 MG tablet Commonly known as: ATIVAN Take 1 tablet (1 mg total) by mouth every 8 (eight) hours as needed for anxiety.   magnesium oxide 400 MG tablet Commonly known as: MAG-OX Take 1 tablet (400 mg total) by mouth daily.   meloxicam 7.5 MG tablet Commonly known as: MOBIC Take 7.5 mg by mouth 2 (two) times daily as needed.   metFORMIN 500 MG tablet Commonly known as: GLUCOPHAGE TAKE 1 TABLET BY MOUTH THREE TIMES DAILY   metoprolol succinate 25 MG 24 hr tablet Commonly known as: TOPROL-XL Take 0.5 tablets (12.5 mg total) by mouth 2 (two) times daily. What changed:  medication strength how much to take when to take this Another medication with the same name was removed. Continue  taking this medication, and follow the directions you see here.   multivitamin with minerals Tabs tablet Take 1 tablet by mouth daily.   naproxen 375 MG tablet Commonly known as: NAPROSYN Take 375 mg by mouth daily as needed (pain).   OneTouch Verio w/Device Kit Use to check blood sugar TID as directed.  Dx Code: E11.9   oxybutynin 10 MG 24 hr tablet Commonly known as: DITROPAN-XL Take 1 tablet (10 mg total) by mouth daily. Additional refills from [facility]   potassium chloride SA 20 MEQ tablet Commonly known as: KLOR-CON TAKE 1 TABLET BY MOUTH ONCE DAILY AND  EXTRA  TABLET  IF  WEIGHT  IS  ABOVE  210  LBS What  changed: See the new instructions.   pravastatin 40 MG tablet Commonly known as: PRAVACHOL Take 1 tablet by mouth once daily   tamsulosin 0.4 MG Caps capsule Commonly known as: FLOMAX Take 1 capsule (0.4 mg total) by mouth daily. Additional refills from [facility] Start taking on: May 30, 2019   temazepam 30 MG capsule Commonly known as: RESTORIL Take 1 capsule (30 mg total) by mouth at bedtime.        Disposition   The patient will be discharged in stable condition to home. Discharge Instructions     (HEART FAILURE PATIENTS) Call MD:  Anytime you have any of the following symptoms: 1) 3 pound weight gain in 24 hours or 5 pounds in 1 week 2) shortness of breath, with or without a dry hacking cough 3) swelling in the hands, feet or stomach 4) if you have to sleep on extra pillows at night in order to breathe.   Complete by: As directed    Diet - low sodium heart healthy   Complete by: As directed    Heart Failure patients record your daily weight using the same scale at the same time of day   Complete by: As directed    Increase activity slowly   Complete by: As directed       Follow-up Information     [facility] Follow up.   Specialty: Cardiology Why: 06/18/2019 @ 9:00AM with C.Marlene Lard, [place] Contact  information: [address] [id number] [place] [address] [place] [phone]        Constance Haw, MD Follow up.   Specialty: Cardiology Why: 08/14/2019 @ 10:45AM Contact information: [address] [place] [address] [phone]         Irine Seal, MD. Call on 05/28/2019.   Specialty: Urology Why: They will call you for an appointment.  Contact information: [place] [address] [phone]         [facility] Follow up on 06/12/2019.   Specialty: Cardiology Why: at 3:30 [place] information: [address] [id number] [place] [phone]             Duration of Discharge Encounter: Greater than 35 minutes   Signed, Amy Clegg NP-C  05/29/2019, 11:50 AM  Agree with above. He is ready for d/c. F/u in HF Clinic.   [name], MD  6:39 PM

## 2019-05-29 LAB — CBC
HCT: 35 % — ABNORMAL LOW (ref 39.0–52.0)
Hemoglobin: 10.5 g/dL — ABNORMAL LOW (ref 13.0–17.0)
MCH: 25.7 pg — ABNORMAL LOW (ref 26.0–34.0)
MCHC: 30 g/dL (ref 30.0–36.0)
MCV: 85.6 fL (ref 80.0–100.0)
Platelets: 339 10*3/uL (ref 150–400)
RBC: 4.09 MIL/uL — ABNORMAL LOW (ref 4.22–5.81)
RDW: 16.3 % — ABNORMAL HIGH (ref 11.5–15.5)
WBC: 3.7 10*3/uL — ABNORMAL LOW (ref 4.0–10.5)
nRBC: 0 % (ref 0.0–0.2)

## 2019-05-29 LAB — COOXEMETRY PANEL
Carboxyhemoglobin: 1.4 % (ref 0.5–1.5)
Carboxyhemoglobin: 1.8 % — ABNORMAL HIGH (ref 0.5–1.5)
Methemoglobin: 1 % (ref 0.0–1.5)
Methemoglobin: 1.1 % (ref 0.0–1.5)
O2 Saturation: 45.4 %
O2 Saturation: 58.3 %
Total hemoglobin: 9.9 g/dL — ABNORMAL LOW (ref 12.0–16.0)
Total hemoglobin: 11.3 g/dL — ABNORMAL LOW (ref 12.0–16.0)

## 2019-05-29 LAB — BASIC METABOLIC PANEL
Anion gap: 9 (ref 5–15)
BUN: 30 mg/dL — ABNORMAL HIGH (ref 8–23)
CO2: 29 mmol/L (ref 22–32)
Calcium: 9.2 mg/dL (ref 8.9–10.3)
Chloride: 101 mmol/L (ref 98–111)
Creatinine, Ser: 1.61 mg/dL — ABNORMAL HIGH (ref 0.61–1.24)
GFR calc Af Amer: 51 mL/min — ABNORMAL LOW (ref >60)
GFR calc non Af Amer: 44 mL/min — ABNORMAL LOW (ref >60)
Glucose, Bld: 141 mg/dL — ABNORMAL HIGH (ref 70–99)
Potassium: 3.7 mmol/L (ref 3.5–5.1)
Sodium: 139 mmol/L (ref 135–145)

## 2019-05-29 LAB — MAGNESIUM: Magnesium: 2.1 mg/dL (ref 1.7–2.4)

## 2019-05-29 LAB — DIGOXIN LEVEL: Digoxin Level: 0.8 ng/mL (ref 0.8–2.0)

## 2019-05-29 LAB — GLUCOSE, CAPILLARY
Glucose-Capillary: 127 mg/dL — ABNORMAL HIGH (ref 70–99)
Glucose-Capillary: 164 mg/dL — ABNORMAL HIGH (ref 70–99)

## 2019-05-29 MED ORDER — HYDRALAZINE HCL 25 MG PO TABS: 25.0000 mg | ORAL_TABLET | Freq: Three times a day (TID) | ORAL | 6 refills | Status: DC

## 2019-05-29 MED ORDER — FUROSEMIDE 80 MG PO TABS: 80.0000 mg | ORAL_TABLET | Freq: Every day | ORAL | 6 refills | Status: DC

## 2019-05-29 MED ORDER — OXYBUTYNIN CHLORIDE ER 10 MG PO TB24: 10.0000 mg | ORAL_TABLET | ORAL | 0 refills | Status: DC

## 2019-05-29 MED ORDER — DIGOXIN 125 MCG PO TABS: 0.0625 mg | ORAL_TABLET | Freq: Every day | ORAL | 6 refills | Status: DC

## 2019-05-29 MED ORDER — AMIODARONE HCL 200 MG PO TABS: 200.0000 mg | ORAL_TABLET | Freq: Two times a day (BID) | ORAL | 6 refills | Status: DC

## 2019-05-29 MED ORDER — ISOSORBIDE MONONITRATE ER 30 MG PO TB24: 30.0000 mg | ORAL_TABLET | Freq: Every day | ORAL | 11 refills | Status: DC

## 2019-05-29 MED ORDER — POTASSIUM CHLORIDE CRYS ER 20 MEQ PO TBCR
40.0000 meq | EXTENDED_RELEASE_TABLET | Freq: Once | ORAL | Status: AC
  Administered 2019-05-29: 40 meq via ORAL
  Filled 2019-05-29: qty 2

## 2019-05-29 MED ORDER — METOPROLOL SUCCINATE ER 25 MG PO TB24: 12.5000 mg | ORAL_TABLET | Freq: Two times a day (BID) | ORAL | 6 refills | Status: DC

## 2019-05-29 MED ORDER — TAMSULOSIN HCL 0.4 MG PO CAPS: 0.4000 mg | ORAL_CAPSULE | Freq: Every day | ORAL | 0 refills | Status: DC

## 2019-05-29 MED ORDER — DIGOXIN 125 MCG PO TABS: 0.0625 mg | ORAL_TABLET | Freq: Every day | ORAL | Status: DC

## 2019-05-29 NOTE — Care Management Important Message (Signed)
Important Message  Patient Details  Name: [name] MRN: [id number] Date of Birth: Aug 16, 1950   Medicare Important Message Given:  Yes     Memory Argue 05/29/2019, 11:00 AM

## 2019-05-29 NOTE — Plan of Care (Signed)
Patient is ready for discharge

## 2019-05-29 NOTE — Plan of Care (Signed)
Patient is progressing and getting ready for discharge.

## 2019-05-29 NOTE — Progress Notes (Signed)
Notified by attending service of pt's planned discharge today Loop implanted 05/16/2019 by Dr. Sallyanne Kuster and if any dressing/wound care instructions Dressing had been removed without difficulty Wound is well healed, skin edges are approximated.  No erythema, edema or signs of infection.  Site is non-tender Device was not interrogated given he has been monitored in-patient here The patient has his home transmitter bedside, reminded to plug in bedside once home He has AFib clinic and Dr. Sallyanne Kuster follow up in place as well as devic clinic monthly remotes Research is aware of his discharge planned for today and have been by already as well.  Tommye Standard, PA-C

## 2019-05-29 NOTE — Progress Notes (Signed)
Nutrition Follow-up  DOCUMENTATION CODES:   Not applicable  INTERVENTION:   - Continue Ensure Enlive po BID, each supplement provides 350 kcal and 20 grams of protein  - Continue Pro-stat 30 ml po BID, each supplement provides 100 kcal and 15 grams of protein  - Encourage adequate PO intake  NUTRITION DIAGNOSIS:   Increased nutrient needs related to post-op healing as evidenced by estimated needs.  Ongoing, being addressed via oral nutrition supplements  GOAL:   Patient will meet greater than or equal to 90% of their needs  Progressing  MONITOR:   PO intake, Supplement acceptance, Labs, Weight trends, Skin, I & O's  REASON FOR ASSESSMENT:   Ventilator    ASSESSMENT:   Patient with PMH significant for CHF, AI, HTN, and DM. Presents this admission for elective catheter ablation for a.fib.  12/09 - s/p AF ablation, cardiogenic shock, transferred to ICU on vent 12/10 - extubated, clear liquids 12/11 - Heart Healthy diet  Weight down 2 lbs compared to admit weight. Will continue to monitor trends.  Spoke with pt at bedside. Pt reports that at home, he eats 1 meal daily and then snacks throughout the day. Pt reports that having 3 meals daily is more food than he is used to, but he usually does well at breakfast and lunch then picks at dinner meal tray.  Pt states he is drinking the Ensure Enlive and taking the Pro-stat now that he knows why he should be taking them. RD encouraged pt to continue to consume Ensure Enlive or similar supplement after discharge.  Pt eager to go home today.  Meal Completion: 25-100% x last 8 recorded meals (averaging 75%)  Medications reviewed and include: Ensure Enlive BID, Pro-stat BID, Lasix, SSI, protonix  Labs reviewed: BUN 30, creatinine 1.61 CBG's: 127-250 x 24 hours  UOP: 2950 ml x 24 hours  Diet Order:   Diet Order            Diet Heart Room service appropriate? Yes; Fluid consistency: Thin  Diet effective now              EDUCATION NEEDS:   No education needs have been identified at this time  Skin:  Skin Assessment: Skin Integrity Issues: Incisions: left chest  Last BM:  05/27/19  Height:   Ht Readings from Last 1 Encounters:  05/20/19 5\' 11"  (1.803 m)    Weight:   Wt Readings from Last 1 Encounters:  05/29/19 85.2 kg    Ideal Body Weight:  78.2 kg  BMI:  Body mass index is 26.2 kg/m.  Estimated Nutritional Needs:   Kcal:  2000-2200 kcals  Protein:  100-115 g  Fluid:  >/ 1.8 L    Gaynell Face, MS, RD, LDN Inpatient Clinical Dietitian Pager: [phone] Weekend/After Hours: [phone]

## 2019-05-29 NOTE — Progress Notes (Signed)
Patients discharge instructions were printed and discussed with the patient. He stated he understood all the instructions and his personal belongings given to patient. He left hospital via wheelchair and traveled via car driven by his wife to his home.

## 2019-05-29 NOTE — Progress Notes (Addendum)
Patient ID: [name], male   DOB: May 08, 1951, 68 y.o.   MRN: [id number]     Advanced Heart Failure Rounding Note  PCP-Cardiologist: Sanda Klein, MD  AHF: Dr. Haroldine Laws EP: Dr. Curt Bears   Subjective:   CO-OX 58%.   Wants to go home. Walked 5-6 times in hall. Denies SOB.   Objective:   Weight Range: 85.2 kg Body mass index is 26.2 kg/m.   Vital Signs:   Temp:  [97.4 F (36.3 C)-98.3 F (36.8 C)] 97.4 F (36.3 C) (12/22 1052) Pulse Rate:  [70-83] 83 (12/22 1052) Resp:  [12-19] 15 (12/22 0404) BP: (111-122)/(59-73) 120/73 (12/22 1052) SpO2:  [95 %-99 %] 99 % (12/22 1052) Weight:  [85.2 kg] 85.2 kg (12/22 0500) Last BM Date: 05/27/19  Weight change: Filed Weights   05/27/19 0319 05/28/19 0500 05/29/19 0500  Weight: 85.7 kg 89.5 kg 85.2 kg    Intake/Output:   Intake/Output Summary (Last 24 hours) at 05/29/2019 1150 Last data filed at 05/29/2019 0800 Gross per 24 hour  Intake 240 ml  Output 1850 ml  Net -1610 ml      Physical Exam  Walking in the room. CVP 5  General:  Well appearing. No resp difficulty HEENT: normal Neck: supple. no JVD. Carotids 2+ bilat; no bruits. No lymphadenopathy or thryomegaly appreciated. Cor: PMI nondisplaced. Regular rate & rhythm. No rubs, gallops or murmurs. I removed dressing from [place]. Site is stable. No erythema.  Lungs: clear Abdomen: soft, nontender, nondistended. No hepatosplenomegaly. No bruits or masses. Good bowel sounds. Extremities: no cyanosis, clubbing, rash, edema. RUE PICC  Neuro: alert & orientedx3, cranial nerves grossly intact. moves all 4 extremities w/o difficulty. Affect pleasant   Telemetry   NSR 80s personally reviewed.   Labs    CBC Recent Labs    05/28/19 0707 05/29/19 0530  WBC 4.6 3.7*  HGB 11.2* 10.5*  HCT 37.0* 35.0*  MCV 86.2 85.6  PLT 342 [id number]   Basic Metabolic Panel Recent Labs    05/28/19 0707 05/28/19 1222 05/29/19 0530  NA 138  --  139  K 3.9  --  3.7  CL 102  --  101    CO2 26  --  29  GLUCOSE 155*  --  141*  BUN 32*  --  30*  CREATININE 1.45*  --  1.61*  CALCIUM 9.4  --  9.2  MG  --  2.0 2.1   Liver Function Tests No results for input(s): AST, ALT, ALKPHOS, BILITOT, PROT, ALBUMIN in the last 72 hours. No results for input(s): LIPASE, AMYLASE in the last 72 hours. Cardiac Enzymes No results for input(s): CKTOTAL, CKMB, CKMBINDEX, TROPONINI in the last 72 hours.  BNP: BNP (last 3 results) Recent Labs    05/16/19 1200  BNP 995.4*    ProBNP (last 3 results) No results for input(s): PROBNP in the last 8760 hours.   D-Dimer No results for input(s): DDIMER in the last 72 hours. Hemoglobin A1C No results for input(s): HGBA1C in the last 72 hours. Fasting Lipid Panel No results for input(s): CHOL, HDL, LDLCALC, TRIG, CHOLHDL, LDLDIRECT in the last 72 hours. Thyroid Function Tests No results for input(s): TSH, T4TOTAL, T3FREE, THYROIDAB in the last 72 hours.  Invalid input(s): FREET3  Other results:   Imaging    ECHOCARDIOGRAM LIMITED  Result Date: 05/28/2019   ECHOCARDIOGRAM REPORT   Patient Name:   [name] Date of Exam: 05/28/2019 Medical Rec #:  [id number]  Height:       71.0 in Accession #:    [id number]       Weight:       197.3 lb Date of Birth:  1950-12-26         BSA:          2.10 m Patient Age:    13 years         BP:           114/63 mmHg Patient Gender: M                HR:           70 bpm. Exam Location:  Inpatient Procedure: Limited Echo Indications:    Reason for exam-Echo Congestive Heart Failure 428.0 / I50.9  History:        Patient has prior history of Echocardiogram examinations, most                 recent 05/16/2019. Arrythmias:Atrial Fibrillation,                 Signs/Symptoms:Chest Pain; Risk Factors:Dyslipidemia, Diabetes                 and Sleep Apnea.  Sonographer:    Vikki Ports Turrentine Referring Phys: [phone] AMY D CLEGG IMPRESSIONS  1. Left ventricular ejection fraction, by visual estimation, is 40 to  45%. The left ventricle has moderately decreased function. There is mildly increased left ventricular hypertrophy.  2. Left ventricular diastolic parameters are indeterminate.  3. The left ventricle demonstrates global hypokinesis.  4. Global right ventricle has normal systolic function.The right ventricular size is normal. No increase in right ventricular wall thickness.  5. Left atrial size was normal.  6. Right atrial size was normal.  7. The mitral valve is normal in structure. Trivial mitral valve regurgitation. No evidence of mitral stenosis.  8. The tricuspid valve is normal in structure. Tricuspid valve regurgitation is trivial.  9. Aortic valve regurgitation is moderate. 10. The aortic valve is normal in structure. Aortic valve regurgitation is moderate. No evidence of aortic valve sclerosis or stenosis. 11. Pulmonic regurgitation is moderate. 12. The pulmonic valve was normal in structure. Pulmonic valve regurgitation is moderate. 13. The inferior vena cava is normal in size with greater than 50% respiratory variability, suggesting right atrial pressure of 3 mmHg. FINDINGS  Left Ventricle: Left ventricular ejection fraction, by visual estimation, is 40 to 45%. The left ventricle has moderately decreased function. The left ventricle demonstrates global hypokinesis. There is mildly increased left ventricular hypertrophy. Left ventricular diastolic parameters are indeterminate. Normal left atrial pressure. Right Ventricle: The right ventricular size is normal. No increase in right ventricular wall thickness. Global RV systolic function is has normal systolic function. Left Atrium: Left atrial size was normal in size. Right Atrium: Right atrial size was normal in size Pericardium: There is no evidence of pericardial effusion. Mitral Valve: The mitral valve is normal in structure. Trivial mitral valve regurgitation. No evidence of mitral valve stenosis by observation. Tricuspid Valve: The tricuspid valve is  normal in structure. Tricuspid valve regurgitation is trivial. Aortic Valve: The aortic valve is normal in structure.. There is mild thickening and mild calcification of the aortic valve. Aortic valve regurgitation is moderate. Aortic regurgitation PHT measures 562 msec. The aortic valve is structurally normal, with  no evidence of sclerosis or stenosis. There is mild thickening of the aortic valve. There is mild calcification of the aortic valve. Pulmonic Valve: The pulmonic  valve was normal in structure. Pulmonic valve regurgitation is moderate. Pulmonic regurgitation is moderate. Aorta: The aortic root, ascending aorta and aortic arch are all structurally normal, with no evidence of dilitation or obstruction. Venous: The inferior vena cava is normal in size with greater than 50% respiratory variability, suggesting right atrial pressure of 3 mmHg. IAS/Shunts: No atrial level shunt detected by color flow Doppler. There is no evidence of a patent foramen ovale. No ventricular septal defect is seen or detected. There is no evidence of an atrial septal defect.  LEFT VENTRICLE PLAX 2D LVIDd:         4.88 cm LVIDs:         4.00 cm LV PW:         1.25 cm LV IVS:        1.29 cm LV SV:         42 ml LV SV Index:   19.57  LV Volumes (MOD) LV area d, A2C:    34.90 cm LV area d, A4C:    35.20 cm LV area s, A2C:    25.50 cm LV area s, A4C:    24.20 cm LV major d, A2C:   9.01 cm LV major d, A4C:   8.56 cm LV major s, A2C:   8.55 cm LV major s, A4C:   7.96 cm LV vol d, MOD A2C: 114.0 ml LV vol d, MOD A4C: 122.0 ml LV vol s, MOD A2C: 64.2 ml LV vol s, MOD A4C: 63.8 ml LV SV MOD A2C:     49.8 ml LV SV MOD A4C:     122.0 ml LV SV MOD BP:      54.9 ml LEFT ATRIUM         Index LA diam:    4.40 cm 2.10 cm/m  AORTIC VALVE LVOT Vmax:   115.00 cm/s LVOT Vmean:  83.400 cm/s LVOT VTI:    0.199 m AI PHT:      562 msec  AORTA Ao Root diam: 3.50 cm  SHUNTS Systemic VTI: 0.20 m  Candee Furbish MD Electronically signed by Candee Furbish MD  Signature Date/Time: 05/28/2019/4:43:52 PM    Final      Medications:     Scheduled Medications: . amiodarone  200 mg Oral BID  . apixaban  5 mg Oral BID  . Chlorhexidine Gluconate Cloth  6 each Topical Daily  . [START ON 05/30/2019] digoxin  0.0625 mg Oral Daily  . feeding supplement (ENSURE ENLIVE)  237 mL Oral BID BM  . feeding supplement (PRO-STAT SUGAR FREE 64)  30 mL Oral BID  . furosemide  80 mg Oral QAC breakfast  . insulin aspart  0-5 Units Subcutaneous QHS  . insulin aspart  0-9 Units Subcutaneous TID WC  . isosorbide-hydrALAZINE  0.5 tablet Oral TID  . mouth rinse  15 mL Mouth Rinse BID  . metoprolol succinate  12.5 mg Oral BID  . oxybutynin  10 mg Oral Q24H  . pantoprazole  40 mg Oral Daily  . potassium chloride  40 mEq Oral Once  . pravastatin  40 mg Oral q1800  . sodium chloride flush  10-40 mL Intracatheter Q12H  . sodium chloride flush  3 mL Intravenous Q12H  . tamsulosin  0.4 mg Oral Daily    Infusions: . sodium chloride Stopped (05/19/19 0318)  . sodium chloride 10 mL/hr at 05/19/19 2300    PRN Medications: sodium chloride, acetaminophen, albuterol, ALPRAZolam, guaiFENesin, menthol-cetylpyridinium, ondansetron (ZOFRAN) IV, ondansetron, sodium chloride flush,  sodium chloride flush, traMADol    Patient Profile   68 y/oM w/ PAF/AFL, HTN, OSA, CKD stage 3b and systolic HF felt due to tachy-induced CM, admitted for PVI. Post procedural course c/b cardiogenic shock requiring intubation + inotrope/ pressor support.    Assessment/Plan   1. Cardiogenic shock: - Resolved.  - CO-OX stable off milrinone.  - Continue Bidil 0.5 tab tid.  - Continue Toprol XL 12.5 mg bid.  - Continue digoxin 0.125 mcg.  -Limited ECHO EF improving.   2. 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 CO-OX 58%. Continue 0.0625 mg digoxin daily.  - On po lasix. CVP stable.  - No ACE/ARB/ARNi  nor spiro w/AKI and low BP -Not on bidil due to cost. Split to hydralazine 25 mg tid + imdur 30 mg daily.  - Continue Toprol XL as above.   -  Limited echo showed EF improving. 35-40%. .   3. Recurrent AF - s/p successful PVI 12/9. Has LINQ in place. Discussed with EP today.  - Tikosyn off due to AKI and now on PO amiodarone - Maintaining NSR  - Continue Eliquis - Continue Toprol XL   4. Acute hypoxic respiratory failure.  -Resolved.   5. AKI on CKD 3b - SCr peaked at 2.45. Suspect AKI 2/2 cardiogenic shock.  - Creatinine 1.6.    6. Urinary retention - Also with severe bladder spasms.  - now w/ AKI (see above) - Renal US 12/15 showed no hydronephrosis.  - continue w/ foley.  - continue Flomax and oxybutynin.  - will need to f/u with urology post discharge. I have called to set up follow up today. [facility] called and set up follow up. Continue foley at discharge.     HF  Follow up set up.   Length of Stay: [place], NP  11:50 AM   Advanced Heart Failure Team Pager [phone] (M-F; 7a - 4p)  Please contact [facility] for night-coverage after hours (4p -7a ) and weekends on [email or web address]  Patient seen and examined with the above-signed Advanced Practice Provider and/or Housestaff. I personally reviewed laboratory data, imaging studies and relevant notes. I independently examined the patient and formulated the important aspects of the plan. I have edited the note to reflect any of my changes or salient points. I have personally discussed the plan with the patient and/or family.  Remains in NSR on po amio. Volume status and co-ox look good off inotropes. EF improved to 35% No bleeding on Eliquis.   Stable to day with close HF and EP f/u. Will need f/u with Urology for voiding trials.   [name], MD  3:59 PM

## 2019-05-29 NOTE — Progress Notes (Signed)
CARDIAC REHAB PHASE I   PRE:  Rate/Rhythm: 78 SR  BP:  Supine:   Sitting: 115/82  Standing:    SaO2: 97%RA  MODE:  Ambulation: 2000 ft   POST:  Rate/Rhythm: 97 SR  BP:  Supine:   Sitting: 133/74  Standing:    SaO2: 97%RA 1009-1050 Pt walked 2000 ft on RA with rolling walker independently.  I managed IV pole. No DOE noted. Tolerated well. Hopes to go home today.   Graylon Good, RN BSN  05/29/2019 10:46 AM

## 2019-06-06 ENCOUNTER — Ambulatory Visit: Payer: Self-pay | Admitting: *Deleted

## 2019-06-06 NOTE — Telephone Encounter (Signed)
Low blood sugar this morning, cbg 20 feels shaky.Alert and oriented denies dizziness, sweating, sleepiness, slurred speech. He is eating toast with jelly, cream cheese, OJ with sugar added. Instructed to take one tube of glucose jel. Immediatley afterwards, within 5 minutes, cbg 104 on his wife's glucometer. No longer shaky. Feels normal. I believe his glucose machine is not working properly. Reported his sugars ran low on his machine yesterday also.Stated he had lost 10 pounds during that stay. Instructed to hold Metformin until he sees Dr. Charlett Blake. Patient needs a new one touch verio glucometer, [facility] [address] in [place].  Instructed patient to recheck cbg in one hour, if less than 70 or experiences shakiness, sweating, sleepiness, confusion, dizziness to seek treatment at the ED (his wife to drive). Attempted warm transfer to schedule hospital follow-up appointment preferably today or tomorrow, no answer. Routing to office for contact with patient for appointment.   Reason for Disposition . [1] Blood glucose < 70  mg/dL (3.9 mmol/L) or symptomatic, now improved with Care Advice AND [2] cause unknown  Answer Assessment - Initial Assessment Questions 1. SYMPTOMS: "What symptoms are you concerned about?"    Low blood sugar 2. ONSET:  "When did the symptoms start?"   yesterday 3. BLOOD GLUCOSE: "What is your blood glucose level?"     cbg 39, 44 and up to 104 yesterday 4. USUAL RANGE: "What is your blood glucose level usually?" (e.g., usual fasting morning value, usual evening value)     normal 5. TYPE 1 or 2:  "Do you know what type of diabetes you have?"  (e.g., Type 1, Type 2, Gestational; doesn't know)     Type 2 6. INSULIN: "Do you take insulin?" "What type of insulin(s) do you use? What is the mode of delivery? (syringe, pen; injection or pump) "When did you last give yourself an insulin dose?" (i.e., time or hours/minutes ago) "How much did you give?" (i.e., how many units)     none 7.  DIABETES PILLS: "Do you take any pills for your diabetes?"     Yes, metformin 8. OTHER SYMPTOMS: "Do you have any symptoms?" (e.g., fever, frequent urination, difficulty breathing, vomiting)     Shaky 9. LOW BLOOD GLUCOSE TREATMENT: "What have you done so far to treat the low blood glucose level?"     Toast jelly, now one tube of glucose jel 10. FOOD: "When did you last eat or drink?"      8:30am 11. ALONE: "Are you alone right now or is someone with you?"        Wife is home with him 12. PREGNANCY: "Is there any chance you are pregnant?" "When was your last menstrual period?"       na  Protocols used: DIABETES - LOW BLOOD SUGAR-A-AH

## 2019-06-07 NOTE — Telephone Encounter (Signed)
Spoke with patient he stated he is doing much better after he ate food

## 2019-06-07 NOTE — Telephone Encounter (Signed)
Try and call patient and see how he is doing if I have to do a phone visit get him on schedule if he is feeling badly to ER or UC.

## 2019-06-11 ENCOUNTER — Ambulatory Visit (INDEPENDENT_AMBULATORY_CARE_PROVIDER_SITE_OTHER): Payer: Medicare Other | Admitting: Family Medicine

## 2019-06-11 ENCOUNTER — Other Ambulatory Visit: Payer: Self-pay

## 2019-06-11 ENCOUNTER — Encounter: Payer: Self-pay | Admitting: Family Medicine

## 2019-06-11 VITALS — BP 132/74 | HR 90 | Temp 98.7°F | Resp 18 | Wt 187.4 lb

## 2019-06-11 DIAGNOSIS — G4700 Insomnia, unspecified: Secondary | ICD-10-CM

## 2019-06-11 DIAGNOSIS — E669 Obesity, unspecified: Secondary | ICD-10-CM

## 2019-06-11 DIAGNOSIS — I10 Essential (primary) hypertension: Secondary | ICD-10-CM | POA: Diagnosis not present

## 2019-06-11 DIAGNOSIS — N179 Acute kidney failure, unspecified: Secondary | ICD-10-CM

## 2019-06-11 DIAGNOSIS — I5042 Chronic combined systolic (congestive) and diastolic (congestive) heart failure: Secondary | ICD-10-CM

## 2019-06-11 DIAGNOSIS — E1169 Type 2 diabetes mellitus with other specified complication: Secondary | ICD-10-CM

## 2019-06-11 DIAGNOSIS — E785 Hyperlipidemia, unspecified: Secondary | ICD-10-CM | POA: Diagnosis not present

## 2019-06-11 DIAGNOSIS — N183 Chronic kidney disease, stage 3 unspecified: Secondary | ICD-10-CM

## 2019-06-11 DIAGNOSIS — R251 Tremor, unspecified: Secondary | ICD-10-CM | POA: Diagnosis not present

## 2019-06-11 LAB — LIPID PANEL
Cholesterol: 134 mg/dL (ref 0–200)
HDL: 41.5 mg/dL (ref >39.00)
LDL Cholesterol: 76 mg/dL (ref 0–99)
NonHDL: 92.55
Total CHOL/HDL Ratio: 3
Triglycerides: 83 mg/dL (ref 0.0–149.0)
VLDL: 16.6 mg/dL (ref 0.0–40.0)

## 2019-06-11 LAB — CBC
HCT: 38.1 % — ABNORMAL LOW (ref 39.0–52.0)
Hemoglobin: 12 g/dL — ABNORMAL LOW (ref 13.0–17.0)
MCHC: 31.6 g/dL (ref 30.0–36.0)
MCV: 81.4 fl (ref 78.0–100.0)
Platelets: 253 10*3/uL (ref 150.0–400.0)
RBC: 4.68 Mil/uL (ref 4.22–5.81)
RDW: 17.7 % — ABNORMAL HIGH (ref 11.5–15.5)
WBC: 4.4 10*3/uL (ref 4.0–10.5)

## 2019-06-11 LAB — COMPREHENSIVE METABOLIC PANEL
ALT: 13 U/L (ref 0–53)
AST: 15 U/L (ref 0–37)
Albumin: 4.1 g/dL (ref 3.5–5.2)
Alkaline Phosphatase: 54 U/L (ref 39–117)
BUN: 45 mg/dL — ABNORMAL HIGH (ref 6–23)
CO2: 28 mEq/L (ref 19–32)
Calcium: 9.9 mg/dL (ref 8.4–10.5)
Chloride: 104 mEq/L (ref 96–112)
Creatinine, Ser: 1.55 mg/dL — ABNORMAL HIGH (ref 0.40–1.50)
GFR: 54.48 mL/min — ABNORMAL LOW (ref >60.00)
Glucose, Bld: 142 mg/dL — ABNORMAL HIGH (ref 70–99)
Potassium: 4.5 mEq/L (ref 3.5–5.1)
Sodium: 141 mEq/L (ref 135–145)
Total Bilirubin: 0.6 mg/dL (ref 0.2–1.2)
Total Protein: 7 g/dL (ref 6.0–8.3)

## 2019-06-11 LAB — TSH: TSH: 1.91 u[IU]/mL (ref 0.35–4.50)

## 2019-06-11 LAB — HEMOGLOBIN A1C: Hgb A1c MFr Bld: 6.4 % (ref 4.6–6.5)

## 2019-06-11 MED ORDER — GLYBURIDE 5 MG PO TABS: ORAL_TABLET | ORAL | 1 refills | Status: DC

## 2019-06-11 MED ORDER — TEMAZEPAM 30 MG PO CAPS: 30.0000 mg | ORAL_CAPSULE | Freq: Every day | ORAL | 5 refills | Status: DC

## 2019-06-11 MED ORDER — LORAZEPAM 1 MG PO TABS: 1.0000 mg | ORAL_TABLET | Freq: Three times a day (TID) | ORAL | 5 refills | Status: DC | PRN

## 2019-06-11 NOTE — Patient Instructions (Signed)
Tylenol/acetaminophen ES 500 mg tabs, 1-2 tabs up to three x a day (max of 3000 mg in 24 hours) Acute Kidney Injury, Adult  Acute kidney injury is a sudden worsening of kidney function. The kidneys are organs that have several jobs. They filter the blood to remove waste products and extra fluid. They also maintain a healthy balance of minerals and hormones in the body, which helps control blood pressure and keep bones strong. With this condition, your kidneys do not do their jobs as well as they should. This condition ranges from mild to severe. Over time it may develop into long-lasting (chronic) kidney disease. Early detection and treatment may prevent acute kidney injury from developing into a chronic condition. What are the causes? Common causes of this condition include:  A problem with blood flow to the kidneys. This may be caused by: ? Low blood pressure (hypotension) or shock. ? Blood loss. ? Heart and blood vessel (cardiovascular) disease. ? Severe burns. ? Liver disease.  Direct damage to the kidneys. This may be caused by: ? Certain medicines. ? A kidney infection. ? Poisoning. ? Being around or in contact with toxic substances. ? A surgical wound. ? A hard, direct hit to the kidney area.  A sudden blockage of urine flow. This may be caused by: ? Cancer. ? Kidney stones. ? An enlarged prostate in males. What are the signs or symptoms? Symptoms of this condition may not be obvious until the condition becomes severe. Symptoms of this condition can include:  Tiredness (lethargy), or difficulty staying awake.  Nausea or vomiting.  Swelling (edema) of the face, legs, ankles, or feet.  Problems with urination, such as: ? Abdominal pain, or pain along the side of your stomach (flank). ? Decreased urine production. ? Decrease in the force of urine flow.  Muscle twitches and cramps, especially in the legs.  Confusion or trouble concentrating.  Loss of  appetite.  Fever. How is this diagnosed? This condition may be diagnosed with tests, including:  Blood tests.  Urine tests.  Imaging tests.  A test in which a sample of tissue is removed from the kidneys to be examined under a microscope (kidney biopsy). How is this treated? Treatment for this condition depends on the cause and how severe the condition is. In mild cases, treatment may not be needed. The kidneys may heal on their own. In more severe cases, treatment will involve:  Treating the cause of the kidney injury. This may involve changing any medicines you are taking or adjusting your dosage.  Fluids. You may need specialized IV fluids to balance your body's needs.  Having a catheter placed to drain urine and prevent blockages.  Preventing problems from occurring. This may mean avoiding certain medicines or procedures that can cause further injury to the kidneys. In some cases treatment may also require:  A procedure to remove toxic wastes from the body (dialysis or continuous renal replacement therapy - CRRT).  Surgery. This may be done to repair a torn kidney, or to remove the blockage from the urinary system. Follow these instructions at home: Medicines  Take over-the-counter and prescription medicines only as told by your health care provider.  Do not take any new medicines without your health care provider's approval. Many medicines can worsen your kidney damage.  Do not take any vitamin and mineral supplements without your health care provider's approval. Many nutritional supplements can worsen your kidney damage. Lifestyle  If your health care provider prescribed changes to your  diet, follow them. You may need to decrease the amount of protein you eat.  Achieve and maintain a healthy weight. If you need help with this, ask your health care provider.  Start or continue an exercise plan. Try to exercise at least 30 minutes a day, 5 days a week.  Do not use any  tobacco products, such as cigarettes, chewing tobacco, and e-cigarettes. If you need help quitting, ask your health care provider. General instructions  Keep track of your blood pressure. Report changes in your blood pressure as told by your health care provider.  Stay up to date with immunizations. Ask your health care provider which immunizations you need.  Keep all follow-up visits as told by your health care provider. This is important. Where to find more information  American Association of Kidney Patients: BombTimer.gl  National Kidney Foundation: www.kidney.[place]: https://mathis.com/  [employer] Rehabilitation Program: ? www.lifeoptions.org ? www.kidneyschool.org Contact a health care provider if:  Your symptoms get worse.  You develop new symptoms. Get help right away if:  You develop symptoms of worsening kidney disease, which include: ? Headaches. ? Abnormally dark or light skin. ? Easy bruising. ? Frequent hiccups. ? Chest pain. ? Shortness of breath. ? End of menstruation in women. ? Seizures. ? Confusion or altered mental status. ? Abdominal or back pain. ? Itchiness.  You have a fever.  Your body is producing less urine.  You have pain or bleeding when you urinate. Summary  Acute kidney injury is a sudden worsening of kidney function.  Acute kidney injury can be caused by problems with blood flow to the kidneys, direct damage to the kidneys, and sudden blockage of urine flow.  Symptoms of this condition may not be obvious until it becomes severe. Symptoms may include edema, lethargy, confusion, nausea or vomiting, and problems passing urine.  This condition can usually be diagnosed with blood tests, urine tests, and imaging tests. Sometimes a kidney biopsy is done to diagnose this condition.  Treatment for this condition often involves treating the underlying cause. It is treated with fluids, medicines, dialysis, diet changes, or  surgery. This information is not intended to replace advice given to you by your health care provider. Make sure you discuss any questions you have with your health care provider. Document Revised: 05/06/2017 Document Reviewed: 05/14/2016 Elsevier Patient Education  2020 [employer].

## 2019-06-12 ENCOUNTER — Ambulatory Visit (HOSPITAL_COMMUNITY)
Admit: 2019-06-12 | Discharge: 2019-06-12 | Disposition: A | Discharge status: Home / Self Care | Payer: Medicare Other | Source: Ambulatory Visit | Attending: Cardiology | Admitting: Cardiology

## 2019-06-12 ENCOUNTER — Telehealth (HOSPITAL_COMMUNITY): Payer: Self-pay

## 2019-06-12 ENCOUNTER — Other Ambulatory Visit: Payer: Self-pay

## 2019-06-12 ENCOUNTER — Encounter (HOSPITAL_COMMUNITY): Payer: Self-pay

## 2019-06-12 VITALS — BP 136/78 | HR 82 | Wt 189.0 lb

## 2019-06-12 DIAGNOSIS — G4733 Obstructive sleep apnea (adult) (pediatric): Secondary | ICD-10-CM | POA: Diagnosis not present

## 2019-06-12 DIAGNOSIS — I5023 Acute on chronic systolic (congestive) heart failure: Secondary | ICD-10-CM | POA: Diagnosis not present

## 2019-06-12 DIAGNOSIS — E785 Hyperlipidemia, unspecified: Secondary | ICD-10-CM | POA: Insufficient documentation

## 2019-06-12 DIAGNOSIS — I5022 Chronic systolic (congestive) heart failure: Secondary | ICD-10-CM | POA: Diagnosis not present

## 2019-06-12 DIAGNOSIS — N1832 Chronic kidney disease, stage 3b: Secondary | ICD-10-CM | POA: Diagnosis not present

## 2019-06-12 DIAGNOSIS — Z79899 Other long term (current) drug therapy: Secondary | ICD-10-CM | POA: Diagnosis not present

## 2019-06-12 DIAGNOSIS — Z87891 Personal history of nicotine dependence: Secondary | ICD-10-CM | POA: Insufficient documentation

## 2019-06-12 DIAGNOSIS — Z7984 Long term (current) use of oral hypoglycemic drugs: Secondary | ICD-10-CM | POA: Diagnosis not present

## 2019-06-12 DIAGNOSIS — N179 Acute kidney failure, unspecified: Secondary | ICD-10-CM | POA: Insufficient documentation

## 2019-06-12 DIAGNOSIS — G4700 Insomnia, unspecified: Secondary | ICD-10-CM | POA: Insufficient documentation

## 2019-06-12 DIAGNOSIS — E1122 Type 2 diabetes mellitus with diabetic chronic kidney disease: Secondary | ICD-10-CM | POA: Insufficient documentation

## 2019-06-12 DIAGNOSIS — I130 Hypertensive heart and chronic kidney disease with heart failure and stage 1 through stage 4 chronic kidney disease, or unspecified chronic kidney disease: Secondary | ICD-10-CM | POA: Diagnosis not present

## 2019-06-12 DIAGNOSIS — Z7901 Long term (current) use of anticoagulants: Secondary | ICD-10-CM | POA: Insufficient documentation

## 2019-06-12 DIAGNOSIS — Z8249 Family history of ischemic heart disease and other diseases of the circulatory system: Secondary | ICD-10-CM | POA: Diagnosis not present

## 2019-06-12 DIAGNOSIS — I4891 Unspecified atrial fibrillation: Secondary | ICD-10-CM | POA: Diagnosis not present

## 2019-06-12 NOTE — Progress Notes (Signed)
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  Admitted 05/16/19 for PVI with successful procedure. Post procedure he developed cardiogenic shock and respiratory failure. CCM consulted for intubation. Advanced HF team consulted. As he improved he was extubated.  Treated with pressors and gradually weaned off. HF meds initiated but unable to treat w/ ARB/ARNI nor spiro due to AKI, SCr peaking to 2.2. Tolerated Bidil and digoxin. ECHO initially showed EF 10% but improved 35-40% prior to discharge. Was discharged home on 05/29/19.   Today in f/u he notes significant improvement. Feels great. Thankful for the care he received while hospitalized. Denies palpitations. No resting/ exertional dyspnea. No weakness or fatigue. No CP, LEE, orthopnea, PND, syncope / near syncope. Compliant w/ meds. Tolerating well. Wt stable. He required foley during his hospitalization due to urinary retention from enlarged prostate and was discharged w/ foley but had outpatient f/u with urology and foley d/c. Voiding w/o difficulty. He had f/u with PCP yesterday and labs were obtained, including CMP, TSH and CBC. Renal function improved. SCr down to 1.4. TSH and Hepatic enzymes normal w/ amiodarone.     Review of Systems: [y] = yes, _0  = no   General: Weight gain _1 ; Weight loss _2 ; Anorexia _3 ; Fatigue _4 ; Fever _5 ; Chills _6 ; Weakness _7   Cardiac: Chest pain/pressure _8 ; Resting SOB _9 ; Exertional SOB _10 ; Orthopnea _11 ; Pedal Edema _12 ; Palpitations _13 ; Syncope _14 ; Presyncope _15 ; Paroxysmal nocturnal dyspnea_16   Pulmonary: Cough _17 ; Wheezing_18 ; Hemoptysis_19 ; Sputum _20 ; Snoring _21   GI: Vomiting_22 ; Dysphagia_23 ; Melena_24 ; Hematochezia _25 ; Heartburn_26 ; Abdominal pain _27 ;  Constipation _28 ; Diarrhea _29 ; BRBPR _30   GU: Hematuria_31 ; Dysuria _32 ; Nocturia_33   Vascular: Pain in legs with walking _34 ; Pain in feet with lying flat _35 ; Non-healing sores _36 ; Stroke _37 ; TIA _38 ; Slurred speech _39 ;  Neuro: Headaches_40 ; Vertigo_41 ; Seizures_42 ; Paresthesias_43 ;Blurred vision _44 ; Diplopia _45 ; Vision changes _46   Ortho/Skin: Arthritis _47 ; Joint pain _48 ; Muscle pain _49 ; Joint swelling _50 ; Back Pain _51 ; Rash _52   Psych: Depression_53 ; Anxiety_54   Heme: Bleeding problems _55 ; Clotting disorders _56 ; Anemia _57   Endocrine: Diabetes _58 ; Thyroid dysfunction_59    Past Medical History:  Diagnosis Date  . AORTIC STENOSIS, MODERATE 12/05/2009  . Atrial fibrillation ([place])   . Atrial fibrillation with RVR ([place]) 07/05/2017  . Atrial flutter ([place]) 12/05/2009  . Atypical chest pain 11/08/2011  . BENIGN PROSTATIC HYPERTROPHY, HX OF 12/05/2009  . CHEST PAIN-UNSPECIFIED 11/11/2009  . CHF 12/05/2009  . Chronic renal insufficiency 12/16/2016  . Debility 04/18/2012  . Diarrhea 12/16/2016  . DYSPNEA 12/19/2009  . Edema 04/18/2012  . ERECTILE DYSFUNCTION 01/25/2007  . FATIGUE, CHRONIC 11/11/2009  . Gout 07/10/2012  . Heart murmur   . History of kidney stones    "they passed" (07/05/2017)  . Hyperkalemia 04/18/2012  . Hyperlipidemia   . Hypertension   . HYPERTENSION 01/25/2007  . Hypoxia 05/05/2011  . Insomnia 05/13/2016  . INSOMNIA, HX OF 01/25/2007  . Knee  pain, bilateral 12/28/2010  . Knee pain, right 12/28/2010  . NEUROMA 01/05/2010  . OBESITY NOS 01/25/2007  . OSA on CPAP 04/06/2010  . PERIPHERAL NEUROPATHY 01/05/2010  . PULMONARY FUNCTION TESTS, ABNORMAL 02/02/2010  . Superficial thrombophlebitis of left leg 09/07/2011  . TESTICULAR HYPOFUNCTION 01/05/2010  . 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 Toe pain, right 07/10/2012  . Type II diabetes mellitus ([place]) 12/05/2009  . UNSPECIFIED ANEMIA 01/05/2010    Current Outpatient Medications  Medication Sig Dispense Refill  . albuterol (VENTOLIN HFA) 108  (90 Base) MCG/ACT inhaler Inhale 2 puffs into the lungs every 6 (six) hours as needed for wheezing or shortness of breath. 1 Inhaler 6  . allopurinol (ZYLOPRIM) 300 MG tablet Take 1 tablet by mouth once daily 90 tablet 0  . amiodarone (PACERONE) 200 MG tablet Take 1 tablet (200 mg total) by mouth 2 (two) times daily. 60 tablet 6  . Blood Glucose Monitoring Suppl (ONETOUCH VERIO) w/Device KIT Use to check blood sugar TID as directed.  Dx Code: E11.9 1 kit 0  . cetirizine (ZYRTEC) 10 MG tablet Take 1 tablet (10 mg total) by mouth daily. 30 tablet 11  . Clobetasol Prop Emollient Base (CLOBETASOL PROPIONATE E) 0.05 % emollient cream Apply 1 application topically 2 (two) times daily as needed (insect bite). 60 g 1  . colchicine (COLCRYS) 0.6 MG tablet TAKE ONE TABLET BY MOUTH DAILY AS NEEDED FOR GOUT FLARE UPS. 30 tablet 0  . digoxin (LANOXIN) 0.125 MG tablet Take 0.5 tablets (0.0625 mg total) by mouth daily. 30 tablet 6  . ELIQUIS 5 MG TABS tablet Take 1 tablet by mouth twice daily 60 tablet 10  . fish oil-omega-3 fatty acids 1000 MG capsule Take 1 g by mouth daily.     . furosemide (LASIX) 80 MG tablet Take 1 tablet (80 mg total) by mouth daily before breakfast. 30 tablet 6  . glucose blood test strip (Verio) Use as instructed Check blood sugars TID DxE11.9 100 each 12  . glyBURIDE (DIABETA) 5 MG tablet 1 tab po q am and 1/2 tab po q pm 135 tablet 1  . hydrALAZINE (APRESOLINE) 25 MG tablet Take 1 tablet (25 mg total) by mouth 3 (three) times daily. 90 tablet 6  . HYDROcodone-acetaminophen (NORCO) 10-325 MG tablet Take 1 tablet by mouth every 8 (eight) hours as needed for moderate pain or severe pain. 90 tablet 0  . isosorbide mononitrate (IMDUR) 30 MG 24 hr tablet Take 1 tablet (30 mg total) by mouth daily. 30 tablet 11  . LORazepam (ATIVAN) 1 MG tablet Take 1 tablet (1 mg total) by mouth every 8 (eight) hours as needed for anxiety. 30 tablet 5  . magnesium oxide (MAG-OX) 400 MG tablet Take 1 tablet  (400 mg total) by mouth daily. 30 tablet 11  . metoprolol succinate (TOPROL-XL) 25 MG 24 hr tablet Take 0.5 tablets (12.5 mg total) by mouth 2 (two) times daily. 30 tablet 6  . Multiple Vitamin (MULTIVITAMIN WITH MINERALS) TABS tablet Take 1 tablet by mouth daily.     . naproxen (NAPROSYN) 375 MG tablet Take 375 mg by mouth daily as needed (pain).     . potassium chloride SA (K-DUR,KLOR-CON) 20 MEQ tablet TAKE 1 TABLET BY MOUTH ONCE DAILY AND  EXTRA  TABLET  IF  WEIGHT  IS  ABOVE  210  LBS 180 tablet 2  . pravastatin (PRAVACHOL) 40 MG tablet Take 1 tablet by mouth once daily 90 tablet 3  . tamsulosin (FLOMAX)  0.4 MG CAPS capsule Take 1 capsule (0.4 mg total) by mouth daily. Additional refills from [facility] 30 capsule 0  . temazepam (RESTORIL) 30 MG capsule Take 1 capsule (30 mg total) by mouth at bedtime. 30 capsule 5   No current facility-administered medications for this encounter.    Allergies  Allergen Reactions  . Sulfonamide Derivatives Other (See Comments)    unknown reaction.  Patient states had to go to the ER.   [name] Zolpidem Other (See Comments)    Excessive, prolonged sedation      Social History   Socioeconomic History  . Marital status: Married    Spouse name: Not on file  . Number of children: 4  . Years of education: 21  . Highest education level: High school graduate  Occupational History  . Occupation: retired    Fish farm manager: [employer]  Tobacco Use  . Smoking status: Former Smoker    Packs/day: 1.00    Years: 10.00    Pack years: 10.00    Quit date: 06/07/1980    Years since quitting: 39.0  . Smokeless tobacco: Never Used  Substance and Sexual Activity  . Alcohol use: No  . Drug use: No  . Sexual activity: Not Currently  Other Topics Concern  . Not on file  Social History Narrative   Lives with male partner in a one story home.  His son lives there off and on.  Retired from [employer].  Education: high school.     Social Determinants of Health    Financial Resource Strain:   . Difficulty of Paying Living Expenses: Not on file  Food Insecurity:   . Worried About Charity fundraiser in the Last Year: Not on file  . Ran Out of Food in the Last Year: Not on file  Transportation Needs:   . Lack of Transportation (Medical): Not on file  . Lack of Transportation (Non-Medical): Not on file  Physical Activity:   . Days of Exercise per Week: Not on file  . Minutes of Exercise per Session: Not on file  Stress:   . Feeling of Stress : Not on file  Social Connections:   . Frequency of Communication with Friends and Family: Not on file  . Frequency of Social Gatherings with Friends and Family: Not on file  . Attends Religious Services: Not on file  . Active Member of Clubs or Organizations: Not on file  . Attends Archivist Meetings: Not on file  . Marital Status: Not on file  Intimate Partner Violence:   . Fear of Current or Ex-Partner: Not on file  . Emotionally Abused: Not on file  . Physically Abused: Not on file  . Sexually Abused: Not on file      Family History  Problem Relation Age of Onset  . Clotting disorder Mother   . Heart disease Mother        s/p MI  . Heart attack Mother   . Hypertension Mother   . Diabetes Mother   . Hyperlipidemia Mother   . Stroke Mother   . Cancer Father        ? lung  . Lung disease Father        smoker  . Diabetes Sister   . Hypertension Sister        smoker  . Leukemia Maternal Grandmother        ?  [name] Aneurysm Sister        brain  . Other  Sister        clipped  . Seizures Sister        d/o w/aneurysm/ smoker    Vitals:   06/12/19 1529  Weight: 85.7 kg     PHYSICAL EXAM: General:  Well appearing. No respiratory difficulty HEENT: normal Neck: supple. no JVD. Carotids 2+ bilat; no bruits. No lymphadenopathy or thyromegaly appreciated. Cor: PMI nondisplaced. Regular rate & rhythm. No rubs, gallops or murmurs. Lungs: clear Abdomen: soft, nontender,  nondistended. No hepatosplenomegaly. No bruits or masses. Good bowel sounds. Extremities: no cyanosis, clubbing, rash, edema Neuro: alert & oriented x 3, cranial nerves grossly intact. moves all 4 extremities w/o difficulty. Affect pleasant.  ECG: sinus 1st degree AV block, PVC   ASSESSMENT & PLAN:  1. Cardiogenic shock: Developed cardiogenic shock, post PVI 05/16/19. Initially on pressors after PVI. Pressors gradually weaned off. Initial EF 10%. Repeat Echo 12/21 improved, EF 40-45%. - HF meds started but not treated w/ ARB/ARNi nor spiro due to AKI, w/ SCr >2.  - Continue Toprol XL 12.5 mg bid.  - Per D/c summary, will stop digoxin today - Continue hydrazine + Imdur for afterload reduction in place of ARB/ARNi - SCr improving based on labs obtained at PCP office yesterday. Will continue to follow. If renal function remains stable, consider later addition of ARB at next visit.   -  2. Acute on chronic systolic HF with biventricualr failure due to tachy CM - EF 10% by TEE 12/9 likely due to tachy CM - Limited ECHO completed on 05/28/19 and showed EF improving with SR, 40-45%.  -Euvolemic on exam. NYHA II.  - stop digoxin - continue lasix 80 mg  - Continue metoprolol 12.5 bid  - Continue Hydralazine + Imdur  - Plan future addition of ARB if renal function continues to improve    3. Recurrent AF - s/p successful PVI 12/9.  - Tikosyn off due to AKI and now on PO amiodarone. Plan to continue. - Maintaining NSRon EKG. - Continue Eliquis. CBC yesterday stable.  - Continue Toprol XL  - Follow up in Afib and EP clinic as planned.    5. AKI on CKD 3b - SCr peaked at 2.45. Suspect AKI 2/2 cardiogenic shock.  -Creatinine improving, 1.4 on yesterday's labs   F/u in 4-6 weeks   Kevin Jester, PA-C 06/12/19

## 2019-06-12 NOTE — Patient Instructions (Signed)
EKG done today.  STOP Digoxin  Please follow up with the [facility] in 4-6 weeks.  At the [facility], you and your health needs are our priority. As part of our continuing mission to provide you with exceptional heart care, we have created designated Provider Care Teams. These Care Teams include your primary Cardiologist (physician) and Advanced Practice Providers (APPs- Physician Assistants and Nurse Practitioners) who all work together to provide you with the care you need, when you need it.   You may see any of the following providers on your designated Care Team at your next follow up: [name] Dr [name] . Dr Loralie Champagne . Darrick Grinder, NP . Lyda Jester, PA . Audry Riles, PharmD   Please be sure to bring in all your medications bottles to every appointment.

## 2019-06-12 NOTE — Telephone Encounter (Signed)
COVID-19 pre-appointment screening questions:   Do you have a history of COVID-19 or a positive test result in the past 7-10 days? No  To the best of your knowledge, have you been in close contact with anyone with a confirmed diagnosis of COVID 19? No  Have you had any one or more of the following: Fever, chills, cough, shortness of breath (out of the normal for you) or any flu-like symptoms? No  Are you experiencing any of the following symptoms that is new or out of usual for you: No  . Ear, nose or throat discomfort . Sore throat . Headache . Muscle Pain . Diarrhea . Loss of taste or smell   Reviewed all the following with patient: . Use of hand sanitizer when entering the building . Everyone is required to wear a mask in the building, if you do not have a mask we are happy to provide you with one when you arrive . NO Visitor guidelines   If patient answers YES to any of questions they must change to a virtual visit and place note in comments about symptoms  

## 2019-06-13 DIAGNOSIS — R251 Tremor, unspecified: Secondary | ICD-10-CM | POA: Insufficient documentation

## 2019-06-13 NOTE — Assessment & Plan Note (Signed)
Renal dysfunction improved more on recheck today. Reminded to hydrate as well as he is allowed given the restraints placed on him by his CHF and to avoid OTC products without clearing it with [place].

## 2019-06-13 NOTE — Assessment & Plan Note (Signed)
Recent onset noted in both hands and occurs at rest and with use. He thinks it may correlate with the need to change his meds from Tikosyn to Amiodarone. He is referred to neurology for evaluation and he will discuss his concerns with cardiology.

## 2019-06-13 NOTE — Assessment & Plan Note (Signed)
Had been very ill and hospitalized this past month but is doing much better now. He is following closely with cardiology.

## 2019-06-13 NOTE — Assessment & Plan Note (Signed)
Well controlled, no changes to meds. Encouraged heart healthy diet such as the DASH diet and exercise as tolerated.  °

## 2019-06-13 NOTE — Progress Notes (Signed)
Subjective:    Patient ID: [name], male    DOB: 10-31-1950, 70 y.o.   MRN: [id number]  No chief complaint on file.   HPI Patient is in today for follow up after recent hospitalization for congestive heart failure and renal failure. No recent febrile illness or concerns regarding infectious disease. He was very ill and has made a somewhat remarkable recovery. His cardiac function has improved some as has his renal insufficiency. He is home and while still fatigued and easily sob over all he is doing well. He notes a new onset hand tremor since switch from Tikosyn to Amiodarone. No unsteady gait or other concerns. Denies CP/palp/HA/congestion/fevers/GI or GU c/o. Taking meds as prescribed  Past Medical History:  Diagnosis Date  . AORTIC STENOSIS, MODERATE 12/05/2009  . Atrial fibrillation ([place])   . Atrial fibrillation with RVR ([place]) 07/05/2017  . Atrial flutter ([place]) 12/05/2009  . Atypical chest pain 11/08/2011  . BENIGN PROSTATIC HYPERTROPHY, HX OF 12/05/2009  . CHEST PAIN-UNSPECIFIED 11/11/2009  . CHF 12/05/2009  . Chronic renal insufficiency 12/16/2016  . Debility 04/18/2012  . Diarrhea 12/16/2016  . DYSPNEA 12/19/2009  . Edema 04/18/2012  . ERECTILE DYSFUNCTION 01/25/2007  . FATIGUE, CHRONIC 11/11/2009  . Gout 07/10/2012  . Heart murmur   . History of kidney stones    "they passed" (07/05/2017)  . Hyperkalemia 04/18/2012  . Hyperlipidemia   . Hypertension   . HYPERTENSION 01/25/2007  . Hypoxia 05/05/2011  . Insomnia 05/13/2016  . INSOMNIA, HX OF 01/25/2007  . Knee pain, bilateral 12/28/2010  . Knee pain, right 12/28/2010  . NEUROMA 01/05/2010  . OBESITY NOS 01/25/2007  . OSA on CPAP 04/06/2010  . PERIPHERAL NEUROPATHY 01/05/2010  . PULMONARY FUNCTION TESTS, ABNORMAL 02/02/2010  . Superficial thrombophlebitis of left leg 09/07/2011  . TESTICULAR HYPOFUNCTION 01/05/2010  . 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 Toe pain, right 07/10/2012  . Type II diabetes mellitus ([place]) 12/05/2009  . UNSPECIFIED ANEMIA  01/05/2010    Past Surgical History:  Procedure Laterality Date  . A-FLUTTER ABLATION N/A 05/16/2019   Procedure: A-FLUTTER ABLATION;  Surgeon: Constance Haw, MD;  Location: [facility];  Service: Cardiovascular;  Laterality: N/A;  . ATRIAL FIBRILLATION ABLATION N/A 05/16/2019   Procedure: ATRIAL FIBRILLATION ABLATION;  Surgeon: Constance Haw, MD;  Location: [facility];  Service: Cardiovascular;  Laterality: N/A;  . CARDIAC CATHETERIZATION  10/16/2009   nonischemic cardiomyopathy  . CARDIOVERSION N/A 03/25/2015   Procedure: CARDIOVERSION;  Surgeon: Pixie Casino, MD;  Location: [facility];  Service: Cardiovascular;  Laterality: N/A;  . CARDIOVERSION N/A 06/17/2017   Procedure: CARDIOVERSION;  Surgeon: Sanda Klein, MD;  Location: [facility];  Service: Cardiovascular;  Laterality: N/A;  . CARDIOVERSION N/A 07/08/2017   Procedure: CARDIOVERSION;  Surgeon: Fay Records, MD;  Location: [facility];  Service: Cardiovascular;  Laterality: N/A;  . CARDIOVERSION N/A 02/27/2019   Procedure: CARDIOVERSION;  Surgeon: Sanda Klein, MD;  Location: [facility];  Service: Cardiovascular;  Laterality: N/A;  . METATARSAL OSTEOTOMY Bilateral ~ 1980   removed part of 5th metatarsal to corect curvature of toes   . RIGHT HEART CATH N/A 05/16/2019   Procedure: RIGHT HEART CATH;  Surgeon: Jolaine Artist, MD;  Location: [facility];  Service: Cardiovascular;  Laterality: N/A;  . TEE WITHOUT CARDIOVERSION  05/16/2019   Procedure: Transesophageal Echocardiogram (Tee);  Surgeon: Constance Haw, MD;  Location: [facility];  Service: Cardiovascular;;    Family  History  Problem Relation Age of Onset  . Clotting disorder Mother   . Heart disease Mother        s/p MI  . Heart attack Mother   . Hypertension Mother   . Diabetes Mother   . Hyperlipidemia Mother   . Stroke Mother   . Cancer Father        ? lung  . Lung disease Father        smoker  .  Diabetes Sister   . Hypertension Sister        smoker  . Leukemia Maternal Grandmother        ?  [name] Aneurysm Sister        brain  . Other Sister        clipped  . Seizures Sister        d/o w/aneurysm/ smoker    Social History   Socioeconomic History  . Marital status: Married    Spouse name: Not on file  . Number of children: 4  . Years of education: 70  . Highest education level: High school graduate  Occupational History  . Occupation: retired    Fish farm manager: [employer]  Tobacco Use  . Smoking status: Former Smoker    Packs/day: 1.00    Years: 10.00    Pack years: 10.00    Quit date: 06/07/1980    Years since quitting: 39.0  . Smokeless tobacco: Never Used  Substance and Sexual Activity  . Alcohol use: No  . Drug use: No  . Sexual activity: Not Currently  Other Topics Concern  . Not on file  Social History Narrative   Lives with male partner in a one story home.  His son lives there off and on.  Retired from [employer].  Education: high school.     Social Determinants of Health   Financial Resource Strain:   . Difficulty of Paying Living Expenses: Not on file  Food Insecurity:   . Worried About Charity fundraiser in the Last Year: Not on file  . Ran Out of Food in the Last Year: Not on file  Transportation Needs:   . Lack of Transportation (Medical): Not on file  . Lack of Transportation (Non-Medical): Not on file  Physical Activity:   . Days of Exercise per Week: Not on file  . Minutes of Exercise per Session: Not on file  Stress:   . Feeling of Stress : Not on file  Social Connections:   . Frequency of Communication with Friends and Family: Not on file  . Frequency of Social Gatherings with Friends and Family: Not on file  . Attends Religious Services: Not on file  . Active Member of Clubs or Organizations: Not on file  . Attends Archivist Meetings: Not on file  . Marital Status: Not on file  Intimate Partner Violence:   . Fear of Current or  Ex-Partner: Not on file  . Emotionally Abused: Not on file  . Physically Abused: Not on file  . Sexually Abused: Not on file    Outpatient Medications Prior to Visit  Medication Sig Dispense Refill  . albuterol (VENTOLIN HFA) 108 (90 Base) MCG/ACT inhaler Inhale 2 puffs into the lungs every 6 (six) hours as needed for wheezing or shortness of breath. 1 Inhaler 6  . allopurinol (ZYLOPRIM) 300 MG tablet Take 1 tablet by mouth once daily 90 tablet 0  . amiodarone (PACERONE) 200 MG tablet Take 1 tablet (200 mg total) by  mouth 2 (two) times daily. 60 tablet 6  . Blood Glucose Monitoring Suppl (ONETOUCH VERIO) w/Device KIT Use to check blood sugar TID as directed.  Dx Code: E11.9 1 kit 0  . cetirizine (ZYRTEC) 10 MG tablet Take 1 tablet (10 mg total) by mouth daily. 30 tablet 11  . Clobetasol Prop Emollient Base (CLOBETASOL PROPIONATE E) 0.05 % emollient cream Apply 1 application topically 2 (two) times daily as needed (insect bite). 60 g 1  . colchicine (COLCRYS) 0.6 MG tablet TAKE ONE TABLET BY MOUTH DAILY AS NEEDED FOR GOUT FLARE UPS. 30 tablet 0  . ELIQUIS 5 MG TABS tablet Take 1 tablet by mouth twice daily 60 tablet 10  . fish oil-omega-3 fatty acids 1000 MG capsule Take 1 g by mouth daily.     . furosemide (LASIX) 80 MG tablet Take 1 tablet (80 mg total) by mouth daily before breakfast. 30 tablet 6  . glucose blood test strip (Verio) Use as instructed Check blood sugars TID DxE11.9 100 each 12  . hydrALAZINE (APRESOLINE) 25 MG tablet Take 1 tablet (25 mg total) by mouth 3 (three) times daily. 90 tablet 6  . HYDROcodone-acetaminophen (NORCO) 10-325 MG tablet Take 1 tablet by mouth every 8 (eight) hours as needed for moderate pain or severe pain. 90 tablet 0  . isosorbide mononitrate (IMDUR) 30 MG 24 hr tablet Take 1 tablet (30 mg total) by mouth daily. 30 tablet 11  . magnesium oxide (MAG-OX) 400 MG tablet Take 1 tablet (400 mg total) by mouth daily. 30 tablet 11  . metoprolol succinate  (TOPROL-XL) 25 MG 24 hr tablet Take 0.5 tablets (12.5 mg total) by mouth 2 (two) times daily. 30 tablet 6  . Multiple Vitamin (MULTIVITAMIN WITH MINERALS) TABS tablet Take 1 tablet by mouth daily.     . naproxen (NAPROSYN) 375 MG tablet Take 375 mg by mouth daily as needed (pain).     . potassium chloride SA (K-DUR,KLOR-CON) 20 MEQ tablet TAKE 1 TABLET BY MOUTH ONCE DAILY AND  EXTRA  TABLET  IF  WEIGHT  IS  ABOVE  210  LBS 180 tablet 2  . pravastatin (PRAVACHOL) 40 MG tablet Take 1 tablet by mouth once daily 90 tablet 3  . tamsulosin (FLOMAX) 0.4 MG CAPS capsule Take 1 capsule (0.4 mg total) by mouth daily. Additional refills from [facility] 30 capsule 0  . calcium carbonate (TUMS - DOSED IN MG ELEMENTAL CALCIUM) 500 MG chewable tablet Chew 1 tablet by mouth 3 (three) times daily as needed (heartburn/indigestion.).     [name] digoxin (LANOXIN) 0.125 MG tablet Take 0.5 tablets (0.0625 mg total) by mouth daily. 30 tablet 6  . glyBURIDE (DIABETA) 5 MG tablet Take 1 tablet by mouth once daily with breakfast (Patient taking differently: Take 5 mg by mouth daily with breakfast. ) 90 tablet 0  . LORazepam (ATIVAN) 1 MG tablet Take 1 tablet (1 mg total) by mouth every 8 (eight) hours as needed for anxiety. 30 tablet 5  . meloxicam (MOBIC) 7.5 MG tablet Take 7.5 mg by mouth 2 (two) times daily as needed.    . metFORMIN (GLUCOPHAGE) 500 MG tablet TAKE 1 TABLET BY MOUTH THREE TIMES DAILY 270 tablet 0  . oxybutynin (DITROPAN-XL) 10 MG 24 hr tablet Take 1 tablet (10 mg total) by mouth daily. Additional refills from [facility] 30 tablet 0  . temazepam (RESTORIL) 30 MG capsule Take 1 capsule (30 mg total) by mouth at bedtime. 30 capsule 5  No facility-administered medications prior to visit.    Allergies  Allergen Reactions  . Sulfonamide Derivatives Other (See Comments)    unknown reaction.  Patient states had to go to the ER.   [name] Zolpidem Other (See Comments)    Excessive, prolonged sedation     Review of Systems  Constitutional: Positive for malaise/fatigue. Negative for fever.  HENT: Negative for congestion.   Eyes: Negative for blurred vision.  Respiratory: Positive for shortness of breath.   Cardiovascular: Positive for leg swelling. Negative for chest pain and palpitations.  Gastrointestinal: Negative for abdominal pain, blood in stool and nausea.  Genitourinary: Negative for dysuria and frequency.  Musculoskeletal: Negative for falls.  Skin: Negative for rash.  Neurological: Positive for tremors. Negative for dizziness, loss of consciousness and headaches.  Endo/Heme/Allergies: Negative for environmental allergies.  Psychiatric/Behavioral: Negative for depression. The patient is not nervous/anxious.        Objective:    Physical Exam Vitals and nursing note reviewed.  Constitutional:      General: He is not in acute distress.    Appearance: He is well-developed.  HENT:     Head: Normocephalic and atraumatic.     Nose: Nose normal.  Eyes:     General:        Right eye: No discharge.        Left eye: No discharge.  Cardiovascular:     Rate and Rhythm: Normal rate and regular rhythm.     Heart sounds: Murmur present.  Pulmonary:     Effort: Pulmonary effort is normal.     Breath sounds: Normal breath sounds.  Abdominal:     General: Bowel sounds are normal.     Palpations: Abdomen is soft.     Tenderness: There is no abdominal tenderness.  Musculoskeletal:     Cervical back: Normal range of motion and neck supple.     Right lower leg: Edema present.     Left lower leg: Edema present.  Skin:    General: Skin is warm and dry.  Neurological:     Mental Status: He is alert and oriented to person, place, and time.     BP 132/74   Pulse 90   Temp 98.7 F (37.1 C) (Temporal)   Resp 18   Wt 187 lb 6.4 oz (85 kg)   SpO2 100%   BMI 26.14 kg/m  Wt Readings from Last 3 Encounters:  06/12/19 189 lb (85.7 kg)  06/11/19 187 lb 6.4 oz (85 kg)  05/29/19  187 lb 13.3 oz (85.2 kg)    Diabetic Foot Exam - Simple   No data filed     Lab Results  Component Value Date   WBC 4.4 06/11/2019   HGB 12.0 (L) 06/11/2019   HCT 38.1 (L) 06/11/2019   PLT 253.0 06/11/2019   GLUCOSE 142 (H) 06/11/2019   CHOL 134 06/11/2019   TRIG 83.0 06/11/2019   HDL 41.50 06/11/2019   LDLDIRECT 84.0 05/02/2018   LDLCALC 76 06/11/2019   ALT 13 06/11/2019   AST 15 06/11/2019   NA 141 06/11/2019   K 4.5 06/11/2019   CL 104 06/11/2019   CREATININE 1.55 (H) 06/11/2019   BUN 45 (H) 06/11/2019   CO2 28 06/11/2019   TSH 1.91 06/11/2019   PSA 10.76 (H) 02/06/2019   INR 2.2 09/06/2018   HGBA1C 6.4 06/11/2019   MICROALBUR 1.1 04/14/2015    Lab Results  Component Value Date   TSH 1.91 06/11/2019   Lab  Results  Component Value Date   WBC 4.4 06/11/2019   HGB 12.0 (L) 06/11/2019   HCT 38.1 (L) 06/11/2019   MCV 81.4 06/11/2019   PLT 253.0 06/11/2019   Lab Results  Component Value Date   NA 141 06/11/2019   K 4.5 06/11/2019   CO2 28 06/11/2019   GLUCOSE 142 (H) 06/11/2019   BUN 45 (H) 06/11/2019   CREATININE 1.55 (H) 06/11/2019   BILITOT 0.6 06/11/2019   ALKPHOS 54 06/11/2019   AST 15 06/11/2019   ALT 13 06/11/2019   PROT 7.0 06/11/2019   ALBUMIN 4.1 06/11/2019   CALCIUM 9.9 06/11/2019   ANIONGAP 9 05/29/2019   GFR 54.48 (L) 06/11/2019   Lab Results  Component Value Date   CHOL 134 06/11/2019   Lab Results  Component Value Date   HDL 41.50 06/11/2019   Lab Results  Component Value Date   LDLCALC 76 06/11/2019   Lab Results  Component Value Date   TRIG 83.0 06/11/2019   Lab Results  Component Value Date   CHOLHDL 3 06/11/2019   Lab Results  Component Value Date   HGBA1C 6.4 06/11/2019       Assessment & Plan:   Problem List Items Addressed This Visit    Diabetes mellitus type 2 in obese ([place])   Relevant Medications   glyBURIDE (DIABETA) 5 MG tablet   Other Relevant Orders   CMP (Completed)   A1C (Completed)   Chronic  combined systolic and diastolic heart failure ([place])    Had been very ill and hospitalized this past month but is doing much better now. He is following closely with cardiology.       Hyperlipidemia - Primary    Encouraged heart healthy diet, increase exercise, avoid trans fats, consider a krill oil cap daily, tolerating Pravastatin.       Relevant Orders   Lipid panel (Completed)   Essential hypertension, malignant    Well controlled, no changes to meds. Encouraged heart healthy diet such as the DASH diet and exercise as tolerated.       Relevant Orders   CBC (Completed)   TSH (Completed)   Insomnia    Given refill on his Temazepam today. This has worked well for him for several years. No concerning side effects reported by patient or family.       Acute-on-chronic kidney injury [facility])    Renal dysfunction improved more on recheck today. Reminded to hydrate as well as he is allowed given the restraints placed on him by his CHF and to avoid OTC products without clearing it with [place].       Tremor    Recent onset noted in both hands and occurs at rest and with use. He thinks it may correlate with the need to change his meds from Tikosyn to Amiodarone. He is referred to neurology for evaluation and he will discuss his concerns with cardiology.        Other Visit Diagnoses    Tremor of both hands       Relevant Orders   Ambulatory referral to Neurology      I have discontinued Maruice H. Weathersby's calcium carbonate, metFORMIN, meloxicam, and oxybutynin. I have also changed his glyBURIDE. Additionally, I am having him maintain his fish oil-omega-3 fatty acids, cetirizine, magnesium oxide, colchicine, albuterol, Clobetasol Prop Emollient Base, potassium chloride SA, HYDROcodone-acetaminophen, glucose blood, multivitamin with minerals, OneTouch Verio, Eliquis, pravastatin, allopurinol, naproxen, amiodarone, furosemide, metoprolol succinate, tamsulosin, hydrALAZINE, isosorbide mononitrate,  LORazepam, and  temazepam.  Meds ordered this encounter  Medications  . glyBURIDE (DIABETA) 5 MG tablet    Sig: 1 tab po q am and 1/2 tab po q pm    Dispense:  135 tablet    Refill:  1  . LORazepam (ATIVAN) 1 MG tablet    Sig: Take 1 tablet (1 mg total) by mouth every 8 (eight) hours as needed for anxiety.    Dispense:  30 tablet    Refill:  5  . temazepam (RESTORIL) 30 MG capsule    Sig: Take 1 capsule (30 mg total) by mouth at bedtime.    Dispense:  30 capsule    Refill:  5     Penni Homans, MD

## 2019-06-13 NOTE — Assessment & Plan Note (Signed)
Given refill on his Temazepam today. This has worked well for him for several years. No concerning side effects reported by patient or family.

## 2019-06-13 NOTE — Assessment & Plan Note (Signed)
Encouraged heart healthy diet, increase exercise, avoid trans fats, consider a krill oil cap daily, tolerating Pravastatin.

## 2019-06-18 ENCOUNTER — Ambulatory Visit (HOSPITAL_COMMUNITY)
Admission: RE | Admit: 2019-06-18 | Discharge: 2019-06-18 | Disposition: A | Discharge status: Home / Self Care | Payer: Medicare Other | Source: Ambulatory Visit | Attending: Physician Assistant | Admitting: Physician Assistant

## 2019-06-18 ENCOUNTER — Other Ambulatory Visit: Payer: Self-pay

## 2019-06-18 ENCOUNTER — Ambulatory Visit (INDEPENDENT_AMBULATORY_CARE_PROVIDER_SITE_OTHER): Payer: Medicare Other | Admitting: *Deleted

## 2019-06-18 VITALS — BP 126/70 | HR 85 | Ht 71.0 in | Wt 190.6 lb

## 2019-06-18 DIAGNOSIS — I371 Nonrheumatic pulmonary valve insufficiency: Secondary | ICD-10-CM | POA: Diagnosis not present

## 2019-06-18 DIAGNOSIS — I4891 Unspecified atrial fibrillation: Secondary | ICD-10-CM

## 2019-06-18 DIAGNOSIS — E1122 Type 2 diabetes mellitus with diabetic chronic kidney disease: Secondary | ICD-10-CM | POA: Diagnosis not present

## 2019-06-18 DIAGNOSIS — R570 Cardiogenic shock: Secondary | ICD-10-CM | POA: Diagnosis not present

## 2019-06-18 DIAGNOSIS — Z79899 Other long term (current) drug therapy: Secondary | ICD-10-CM | POA: Diagnosis not present

## 2019-06-18 DIAGNOSIS — D6869 Other thrombophilia: Secondary | ICD-10-CM

## 2019-06-18 DIAGNOSIS — Z87891 Personal history of nicotine dependence: Secondary | ICD-10-CM | POA: Insufficient documentation

## 2019-06-18 DIAGNOSIS — I130 Hypertensive heart and chronic kidney disease with heart failure and stage 1 through stage 4 chronic kidney disease, or unspecified chronic kidney disease: Secondary | ICD-10-CM | POA: Insufficient documentation

## 2019-06-18 DIAGNOSIS — I4819 Other persistent atrial fibrillation: Secondary | ICD-10-CM | POA: Diagnosis not present

## 2019-06-18 DIAGNOSIS — E1142 Type 2 diabetes mellitus with diabetic polyneuropathy: Secondary | ICD-10-CM | POA: Insufficient documentation

## 2019-06-18 DIAGNOSIS — I5022 Chronic systolic (congestive) heart failure: Secondary | ICD-10-CM | POA: Diagnosis not present

## 2019-06-18 DIAGNOSIS — Z6826 Body mass index (BMI) 26.0-26.9, adult: Secondary | ICD-10-CM | POA: Insufficient documentation

## 2019-06-18 DIAGNOSIS — I351 Nonrheumatic aortic (valve) insufficiency: Secondary | ICD-10-CM | POA: Diagnosis not present

## 2019-06-18 DIAGNOSIS — N1832 Chronic kidney disease, stage 3b: Secondary | ICD-10-CM | POA: Diagnosis not present

## 2019-06-18 DIAGNOSIS — N529 Male erectile dysfunction, unspecified: Secondary | ICD-10-CM | POA: Insufficient documentation

## 2019-06-18 DIAGNOSIS — I350 Nonrheumatic aortic (valve) stenosis: Secondary | ICD-10-CM | POA: Insufficient documentation

## 2019-06-18 DIAGNOSIS — G4733 Obstructive sleep apnea (adult) (pediatric): Secondary | ICD-10-CM | POA: Diagnosis not present

## 2019-06-18 DIAGNOSIS — E785 Hyperlipidemia, unspecified: Secondary | ICD-10-CM | POA: Diagnosis not present

## 2019-06-18 DIAGNOSIS — I4892 Unspecified atrial flutter: Secondary | ICD-10-CM | POA: Insufficient documentation

## 2019-06-18 DIAGNOSIS — E669 Obesity, unspecified: Secondary | ICD-10-CM | POA: Diagnosis not present

## 2019-06-18 DIAGNOSIS — Z888 Allergy status to other drugs, medicaments and biological substances status: Secondary | ICD-10-CM | POA: Diagnosis not present

## 2019-06-18 DIAGNOSIS — Z7901 Long term (current) use of anticoagulants: Secondary | ICD-10-CM | POA: Insufficient documentation

## 2019-06-18 DIAGNOSIS — E875 Hyperkalemia: Secondary | ICD-10-CM | POA: Insufficient documentation

## 2019-06-18 DIAGNOSIS — M109 Gout, unspecified: Secondary | ICD-10-CM | POA: Diagnosis not present

## 2019-06-18 DIAGNOSIS — I491 Atrial premature depolarization: Secondary | ICD-10-CM | POA: Insufficient documentation

## 2019-06-18 DIAGNOSIS — Z882 Allergy status to sulfonamides status: Secondary | ICD-10-CM | POA: Diagnosis not present

## 2019-06-18 DIAGNOSIS — N400 Enlarged prostate without lower urinary tract symptoms: Secondary | ICD-10-CM | POA: Diagnosis not present

## 2019-06-18 LAB — CUP PACEART REMOTE DEVICE CHECK
Date Time Interrogation Session: 20210111171409
Implantable Pulse Generator Implant Date: 20201209

## 2019-06-18 MED ORDER — AMIODARONE HCL 200 MG PO TABS: 200.0000 mg | ORAL_TABLET | Freq: Every day | ORAL | 6 refills | Status: DC

## 2019-06-18 NOTE — Progress Notes (Signed)
Primary Care Physician: [name], MD Primary Cardiologist: Dr Sallyanne Kuster Primary Electrophysiologist: Dr Curt Bears [facility]: Dr Haroldine Laws Referring Physician: Dr Alanson Aly is a 69 y.o. 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 Admitted 05/16/19 for PVI with successful procedure. Post procedure he developed cardiogenic shock and respiratory failure. CCM consulted for intubation. Advanced HF team consulted. As he improved he was extubated. Treated with pressors and gradually weaned off. Was discharged home on 05/29/19. During his admission his dofetilide was changed to amiodarone 2/2 renal insufficiency. He is on Eliquis for a CHADS2VASC score of 3. He reports that he is feeling very well since leaving the hospital. He is able to walk without becoming SOB. He denies CP, swallowing or groin issues.   Today, he denies symptoms of palpitations, chest pain, shortness of breath, orthopnea, PND, lower extremity edema, dizziness, presyncope, syncope, snoring, daytime somnolence, bleeding, or neurologic sequela. The patient is tolerating medications without difficulties and is otherwise without complaint today.    Atrial Fibrillation Risk Factors:  he does have symptoms or diagnosis of sleep apnea. he is compliant with CPAP therapy.   he has a BMI of Body mass index is 26.58 kg/m.[name] Filed Weights   06/18/19 0903  Weight: 86.5 kg    Family History  Problem Relation Age of Onset  . Clotting disorder Mother   . Heart disease Mother        s/p MI  . Heart attack Mother   . Hypertension Mother   . Diabetes Mother   . Hyperlipidemia Mother   . Stroke Mother   . Cancer Father        ? lung  . Lung disease Father        smoker  . Diabetes Sister   . Hypertension Sister        smoker  . Leukemia Maternal Grandmother        ?   [name] Aneurysm Sister        brain  . Other Sister        clipped  . Seizures Sister        d/o w/aneurysm/ smoker     Atrial Fibrillation Management history:  Previous antiarrhythmic drugs: dofetilide, amiodarone Previous cardioversions: 2016, 2019 x2, 02/2019 Previous ablations: 05/16/19 CHADS2VASC score: 3 Anticoagulation history: Eliquis   Past Medical History:  Diagnosis Date  . AORTIC STENOSIS, MODERATE 12/05/2009  . Atrial fibrillation ([place])   . Atrial fibrillation with RVR ([place]) 07/05/2017  . Atrial flutter ([place]) 12/05/2009  . Atypical chest pain 11/08/2011  . BENIGN PROSTATIC HYPERTROPHY, HX OF 12/05/2009  . CHEST PAIN-UNSPECIFIED 11/11/2009  . CHF 12/05/2009  . Chronic renal insufficiency 12/16/2016  . Debility 04/18/2012  . Diarrhea 12/16/2016  . DYSPNEA 12/19/2009  . Edema 04/18/2012  . ERECTILE DYSFUNCTION 01/25/2007  . FATIGUE, CHRONIC 11/11/2009  . Gout 07/10/2012  . Heart murmur   . History of kidney stones    "they passed" (07/05/2017)  . Hyperkalemia 04/18/2012  . Hyperlipidemia   . Hypertension   . HYPERTENSION 01/25/2007  . Hypoxia 05/05/2011  . Insomnia 05/13/2016  . INSOMNIA, HX OF 01/25/2007  . Knee pain, bilateral 12/28/2010  . Knee pain, right 12/28/2010  . NEUROMA 01/05/2010  . OBESITY NOS 01/25/2007  . OSA on CPAP 04/06/2010  . PERIPHERAL NEUROPATHY 01/05/2010  . PULMONARY  FUNCTION TESTS, ABNORMAL 02/02/2010  . Superficial thrombophlebitis of left leg 09/07/2011  . TESTICULAR HYPOFUNCTION 01/05/2010  . 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 Toe pain, right 07/10/2012  . Type II diabetes mellitus ([place]) 12/05/2009  . UNSPECIFIED ANEMIA 01/05/2010   Past Surgical History:  Procedure Laterality Date  . A-FLUTTER ABLATION N/A 05/16/2019   Procedure: A-FLUTTER ABLATION;  Surgeon: Constance Haw, MD;  Location: [facility];  Service: Cardiovascular;  Laterality: N/A;  . ATRIAL FIBRILLATION ABLATION N/A 05/16/2019   Procedure: ATRIAL FIBRILLATION ABLATION;  Surgeon: Constance Haw, MD;  Location: [place] [facility];  Service: Cardiovascular;  Laterality: N/A;  . CARDIAC CATHETERIZATION  10/16/2009   nonischemic cardiomyopathy  . CARDIOVERSION N/A 03/25/2015   Procedure: CARDIOVERSION;  Surgeon: Pixie Casino, MD;  Location: [facility];  Service: Cardiovascular;  Laterality: N/A;  . CARDIOVERSION N/A 06/17/2017   Procedure: CARDIOVERSION;  Surgeon: Sanda Klein, MD;  Location: [facility];  Service: Cardiovascular;  Laterality: N/A;  . CARDIOVERSION N/A 07/08/2017   Procedure: CARDIOVERSION;  Surgeon: Fay Records, MD;  [facility]: [facility];  Service: Cardiovascular;  Laterality: N/A;  . CARDIOVERSION N/A 02/27/2019   Procedure: CARDIOVERSION;  Surgeon: Sanda Klein, MD;  Location: [facility];  Service: Cardiovascular;  Laterality: N/A;  . METATARSAL OSTEOTOMY Bilateral ~ 1980   removed part of 5th metatarsal to corect curvature of toes   . RIGHT HEART CATH N/A 05/16/2019   Procedure: RIGHT HEART CATH;  Surgeon: Jolaine Artist, MD;  Location: [facility];  Service: Cardiovascular;  Laterality: N/A;  . TEE WITHOUT CARDIOVERSION  05/16/2019   Procedure: Transesophageal Echocardiogram (Tee);  Surgeon: Constance Haw, MD;  Location: [place] [facility];  Service: Cardiovascular;;    Current Outpatient Medications  Medication Sig Dispense Refill  . albuterol (VENTOLIN HFA) 108 (90 Base) MCG/ACT inhaler Inhale 2 puffs into the lungs every 6 (six) hours as needed for wheezing or shortness of breath. 1 Inhaler 6  . allopurinol (ZYLOPRIM) 300 MG tablet Take 1 tablet by mouth once daily 90 tablet 0  . amiodarone (PACERONE) 200 MG tablet Take 1 tablet (200 mg total) by mouth daily. 60 tablet 6  . Blood Glucose Monitoring Suppl (ONETOUCH VERIO) w/Device KIT Use to check blood sugar TID as directed.  Dx Code: E11.9 1 kit 0  . cetirizine (ZYRTEC) 10 MG tablet Take 1 tablet (10 mg total) by mouth daily. 30 tablet 11  . Clobetasol Prop Emollient  Base (CLOBETASOL PROPIONATE E) 0.05 % emollient cream Apply 1 application topically 2 (two) times daily as needed (insect bite). 60 g 1  . Clobetasol Propionate Emulsion (TOVET) 0.05 % topical foam Apply topically.    . colchicine (COLCRYS) 0.6 MG tablet TAKE ONE TABLET BY MOUTH DAILY AS NEEDED FOR GOUT FLARE UPS. 30 tablet 0  . ELIQUIS 5 MG TABS tablet Take 1 tablet by mouth twice daily 60 tablet 10  . fish oil-omega-3 fatty acids 1000 MG capsule Take 1 g by mouth daily.     . furosemide (LASIX) 80 MG tablet Take 1 tablet (80 mg total) by mouth daily before breakfast. 30 tablet 6  . glucose blood test strip (Verio) Use as instructed Check blood sugars TID DxE11.9 100 each 12  . glyBURIDE (DIABETA) 5 MG tablet 1 tab po q am and 1/2 tab po q pm 135 tablet 1  . hydrALAZINE (APRESOLINE) 25 MG tablet Take 1 tablet (25 mg total) by mouth 3 (three) times daily. [place]  tablet 6  . HYDROcodone-acetaminophen (NORCO) 10-325 MG tablet Take 1 tablet by mouth every 8 (eight) hours as needed for moderate pain or severe pain. 90 tablet 0  . isosorbide mononitrate (IMDUR) 30 MG 24 hr tablet Take 1 tablet (30 mg total) by mouth daily. 30 tablet 11  . LORazepam (ATIVAN) 1 MG tablet Take 1 tablet (1 mg total) by mouth every 8 (eight) hours as needed for anxiety. 30 tablet 5  . magnesium oxide (MAG-OX) 400 MG tablet Take 1 tablet (400 mg total) by mouth daily. 30 tablet 11  . metoprolol succinate (TOPROL-XL) 25 MG 24 hr tablet Take 0.5 tablets (12.5 mg total) by mouth 2 (two) times daily. 30 tablet 6  . Multiple Vitamin (MULTIVITAMIN WITH MINERALS) TABS tablet Take 1 tablet by mouth daily.     . naproxen (NAPROSYN) 375 MG tablet Take 375 mg by mouth daily as needed (pain).     . potassium chloride SA (K-DUR,KLOR-CON) 20 MEQ tablet TAKE 1 TABLET BY MOUTH ONCE DAILY AND  EXTRA  TABLET  IF  WEIGHT  IS  ABOVE  210  LBS 180 tablet 2  . pravastatin (PRAVACHOL) 40 MG tablet Take 1 tablet by mouth once daily 90 tablet 3  .  tamsulosin (FLOMAX) 0.4 MG CAPS capsule Take 1 capsule (0.4 mg total) by mouth daily. Additional refills from [facility] 30 capsule 0  . temazepam (RESTORIL) 30 MG capsule Take 1 capsule (30 mg total) by mouth at bedtime. 30 capsule 5   No current facility-administered medications for this encounter.    Allergies  Allergen Reactions  . Sulfonamide Derivatives Other (See Comments)    unknown reaction.  Patient states had to go to the ER.   [name] Zolpidem Other (See Comments)    Excessive, prolonged sedation    Social History   Socioeconomic History  . Marital status: Married    Spouse name: Not on file  . Number of children: 4  . Years of education: 67  . Highest education level: High school graduate  Occupational History  . Occupation: retired    Fish farm manager: [employer]  Tobacco Use  . Smoking status: Former Smoker    Packs/day: 1.00    Years: 10.00    Pack years: 10.00    Quit date: 06/07/1980    Years since quitting: 39.0  . Smokeless tobacco: Never Used  Substance and Sexual Activity  . Alcohol use: No  . Drug use: No  . Sexual activity: Not Currently  Other Topics Concern  . Not on file  Social History Narrative   Lives with male partner in a one story home.  His son lives there off and on.  Retired from [employer].  Education: high school.     Social Determinants of Health   Financial Resource Strain:   . Difficulty of Paying Living Expenses: Not on file  Food Insecurity:   . Worried About Charity fundraiser in the Last Year: Not on file  . Ran Out of Food in the Last Year: Not on file  Transportation Needs:   . Lack of Transportation (Medical): Not on file  . Lack of Transportation (Non-Medical): Not on file  Physical Activity:   . Days of Exercise per Week: Not on file  . Minutes of Exercise per Session: Not on file  Stress:   . Feeling of Stress : Not on file  Social Connections:   . Frequency of Communication with Friends and Family: Not on file  .  Frequency of Social Gatherings with Friends and Family: Not on file  . Attends Religious Services: Not on file  . Active Member of Clubs or Organizations: Not on file  . Attends Archivist Meetings: Not on file  . Marital Status: Not on file  Intimate Partner Violence:   . Fear of Current or Ex-Partner: Not on file  . Emotionally Abused: Not on file  . Physically Abused: Not on file  . Sexually Abused: Not on file     ROS- All systems are reviewed and negative except as per the HPI above.  Physical Exam: Vitals:   06/18/19 0903  BP: 126/70  Pulse: 85  Weight: 86.5 kg  Height: _0  (1.803 m)    GEN- The patient is well appearing, alert and oriented x 3 today.   Head- normocephalic, atraumatic Eyes-  Sclera clear, conjunctiva pink Ears- hearing intact Oropharynx- clear Neck- supple  Lungs- Clear to ausculation bilaterally, normal work of breathing Heart- Regular rate and rhythm, occasional ectopic beat, no rubs or gallops, 2/6 systolic murmur   GI- soft, NT, ND, + BS Extremities- no clubbing, cyanosis, or edema MS- no significant deformity or atrophy Skin- no rash or lesion Psych- euthymic mood, full affect Neuro- strength and sensation are intact  Wt Readings from Last 3 Encounters:  06/18/19 86.5 kg  06/12/19 85.7 kg  06/11/19 85 kg    EKG today demonstrates SR HR 85, 1st degree AV block, PVCs, PR 262, QRS 98, QTc 454  Echo 05/28/19 demonstrated   1. Left ventricular ejection fraction, by visual estimation, is 40 to 45%. The left ventricle has moderately decreased function. There is mildly increased left ventricular hypertrophy.  2. Left ventricular diastolic parameters are indeterminate.  3. The left ventricle demonstrates global hypokinesis.  4. Global right ventricle has normal systolic function.The right ventricular size is normal. No increase in right ventricular wall thickness.  5. Left atrial size was normal.  6. Right atrial size was normal.   7. The mitral valve is normal in structure. Trivial mitral valve regurgitation. No evidence of mitral stenosis.  8. The tricuspid valve is normal in structure. Tricuspid valve regurgitation is trivial.  9. Aortic valve regurgitation is moderate. 10. The aortic valve is normal in structure. Aortic valve regurgitation is moderate. No evidence of aortic valve sclerosis or stenosis. 11. Pulmonic regurgitation is moderate. 12. The pulmonic valve was normal in structure. Pulmonic valve regurgitation is moderate. 13. The inferior vena cava is normal in size with greater than 50% respiratory variability, suggesting right atrial pressure of 3 mmHg.  Epic records are reviewed at length today  Assessment and Plan:  1. Persistent atrial fibrillation/atrial flutter S/p afib and atrial flutter ablation with Dr Curt Bears 70/0/17 complicated by cardiogenic shock.  Patient appears to be maintaining SR. Continue Eliquis 5 mg BID for at least 3 months post ablation with no missed doses. Decrease amiodarone to 100 mg daily. This patients CHA2DS2-VASc Score and unadjusted Ischemic Stroke Rate (% per year) is equal to 3.2 % stroke rate/year from a score of 3  Above score calculated as 1 point each if present [CHF, HTN, DM, Vascular=MI/PAD/Aortic Plaque, Age if 65-74, or Male] Above score calculated as 2 points each if present [Age > 75, or Stroke/TIA/TE]   2. Obstructive sleep apnea The importance of adequate treatment of sleep apnea was discussed today in order to improve our ability to maintain sinus rhythm long term. Encouraged compliance with CPAP therapy.  3. Chronic systolic CHF EF  10% on TEE 12/9, tachycardia CM suspected. No signs or symptoms of fluid overload today. Followed by Dr Haroldine Laws.    4. HTN Stable, no changes today.   Follow up with Dr Haroldine Laws, Dr Sallyanne Kuster, and Dr Curt Bears as scheduled.    [facility] [address] [place], [place]  [address] [phone] 06/18/2019 10:22 AM

## 2019-06-18 NOTE — Progress Notes (Signed)
Thanks, Ricky.  I am glad he is better

## 2019-06-18 NOTE — Patient Instructions (Signed)
Decrease amiodarone to 200mg once a day 

## 2019-06-24 ENCOUNTER — Other Ambulatory Visit (HOSPITAL_COMMUNITY): Payer: Self-pay | Admitting: Adult Health

## 2019-07-04 ENCOUNTER — Telehealth: Payer: Self-pay

## 2019-07-04 DIAGNOSIS — E291 Testicular hypofunction: Secondary | ICD-10-CM | POA: Diagnosis not present

## 2019-07-04 DIAGNOSIS — N401 Enlarged prostate with lower urinary tract symptoms: Secondary | ICD-10-CM | POA: Diagnosis not present

## 2019-07-04 DIAGNOSIS — R3914 Feeling of incomplete bladder emptying: Secondary | ICD-10-CM | POA: Diagnosis not present

## 2019-07-04 DIAGNOSIS — R972 Elevated prostate specific antigen [PSA]: Secondary | ICD-10-CM | POA: Diagnosis not present

## 2019-07-04 NOTE — Telephone Encounter (Signed)
I spoke with the pt and he states Amy told him to send weekly. I do not see a phone note from Amy about him sending weekly transmissions. I told him just do what the nurse told him to do. I told him I will track Amy down and ask her do she want him to keep sending manual transmissions or to let the transmission come automatic. I will call him back if any changes or to be made.

## 2019-07-06 DIAGNOSIS — R972 Elevated prostate specific antigen [PSA]: Secondary | ICD-10-CM | POA: Diagnosis not present

## 2019-07-06 DIAGNOSIS — E291 Testicular hypofunction: Secondary | ICD-10-CM | POA: Diagnosis not present

## 2019-07-10 ENCOUNTER — Telehealth (HOSPITAL_COMMUNITY): Payer: Self-pay

## 2019-07-10 NOTE — Telephone Encounter (Signed)

## 2019-07-11 ENCOUNTER — Other Ambulatory Visit: Payer: Self-pay

## 2019-07-11 ENCOUNTER — Ambulatory Visit (HOSPITAL_COMMUNITY)
Admission: RE | Admit: 2019-07-11 | Discharge: 2019-07-11 | Disposition: A | Discharge status: Home / Self Care | Payer: Medicare Other | Source: Ambulatory Visit | Attending: Internal Medicine | Admitting: Internal Medicine

## 2019-07-11 ENCOUNTER — Encounter (HOSPITAL_COMMUNITY): Payer: Self-pay | Admitting: Internal Medicine

## 2019-07-11 VITALS — BP 114/60 | HR 95 | Wt 191.6 lb

## 2019-07-11 DIAGNOSIS — N179 Acute kidney failure, unspecified: Secondary | ICD-10-CM | POA: Diagnosis not present

## 2019-07-11 DIAGNOSIS — Z836 Family history of other diseases of the respiratory system: Secondary | ICD-10-CM | POA: Insufficient documentation

## 2019-07-11 DIAGNOSIS — Z888 Allergy status to other drugs, medicaments and biological substances status: Secondary | ICD-10-CM | POA: Insufficient documentation

## 2019-07-11 DIAGNOSIS — E1122 Type 2 diabetes mellitus with diabetic chronic kidney disease: Secondary | ICD-10-CM | POA: Insufficient documentation

## 2019-07-11 DIAGNOSIS — Z006 Encounter for examination for normal comparison and control in clinical research program: Secondary | ICD-10-CM

## 2019-07-11 DIAGNOSIS — Z833 Family history of diabetes mellitus: Secondary | ICD-10-CM | POA: Insufficient documentation

## 2019-07-11 DIAGNOSIS — M109 Gout, unspecified: Secondary | ICD-10-CM | POA: Diagnosis not present

## 2019-07-11 DIAGNOSIS — I5042 Chronic combined systolic (congestive) and diastolic (congestive) heart failure: Secondary | ICD-10-CM | POA: Diagnosis not present

## 2019-07-11 DIAGNOSIS — E785 Hyperlipidemia, unspecified: Secondary | ICD-10-CM | POA: Insufficient documentation

## 2019-07-11 DIAGNOSIS — G4733 Obstructive sleep apnea (adult) (pediatric): Secondary | ICD-10-CM | POA: Diagnosis not present

## 2019-07-11 DIAGNOSIS — N1832 Chronic kidney disease, stage 3b: Secondary | ICD-10-CM | POA: Insufficient documentation

## 2019-07-11 DIAGNOSIS — Z87891 Personal history of nicotine dependence: Secondary | ICD-10-CM | POA: Diagnosis not present

## 2019-07-11 DIAGNOSIS — R000 Tachycardia, unspecified: Secondary | ICD-10-CM | POA: Diagnosis not present

## 2019-07-11 DIAGNOSIS — I130 Hypertensive heart and chronic kidney disease with heart failure and stage 1 through stage 4 chronic kidney disease, or unspecified chronic kidney disease: Secondary | ICD-10-CM | POA: Diagnosis not present

## 2019-07-11 DIAGNOSIS — I5022 Chronic systolic (congestive) heart failure: Secondary | ICD-10-CM | POA: Diagnosis not present

## 2019-07-11 DIAGNOSIS — Z87442 Personal history of urinary calculi: Secondary | ICD-10-CM | POA: Diagnosis not present

## 2019-07-11 DIAGNOSIS — I4819 Other persistent atrial fibrillation: Secondary | ICD-10-CM | POA: Diagnosis not present

## 2019-07-11 DIAGNOSIS — Z7901 Long term (current) use of anticoagulants: Secondary | ICD-10-CM | POA: Diagnosis not present

## 2019-07-11 DIAGNOSIS — Z79899 Other long term (current) drug therapy: Secondary | ICD-10-CM | POA: Insufficient documentation

## 2019-07-11 DIAGNOSIS — Z8249 Family history of ischemic heart disease and other diseases of the circulatory system: Secondary | ICD-10-CM | POA: Diagnosis not present

## 2019-07-11 DIAGNOSIS — I5082 Biventricular heart failure: Secondary | ICD-10-CM | POA: Insufficient documentation

## 2019-07-11 DIAGNOSIS — Z7984 Long term (current) use of oral hypoglycemic drugs: Secondary | ICD-10-CM | POA: Insufficient documentation

## 2019-07-11 DIAGNOSIS — Z882 Allergy status to sulfonamides status: Secondary | ICD-10-CM | POA: Insufficient documentation

## 2019-07-11 DIAGNOSIS — N400 Enlarged prostate without lower urinary tract symptoms: Secondary | ICD-10-CM | POA: Insufficient documentation

## 2019-07-11 DIAGNOSIS — Z8349 Family history of other endocrine, nutritional and metabolic diseases: Secondary | ICD-10-CM | POA: Diagnosis not present

## 2019-07-11 DIAGNOSIS — Z823 Family history of stroke: Secondary | ICD-10-CM | POA: Diagnosis not present

## 2019-07-11 LAB — BASIC METABOLIC PANEL
Anion gap: 10 (ref 5–15)
BUN: 31 mg/dL — ABNORMAL HIGH (ref 8–23)
CO2: 26 mmol/L (ref 22–32)
Calcium: 9.5 mg/dL (ref 8.9–10.3)
Chloride: 104 mmol/L (ref 98–111)
Creatinine, Ser: 1.78 mg/dL — ABNORMAL HIGH (ref 0.61–1.24)
GFR calc Af Amer: 45 mL/min — ABNORMAL LOW (ref >60)
GFR calc non Af Amer: 39 mL/min — ABNORMAL LOW (ref >60)
Glucose, Bld: 104 mg/dL — ABNORMAL HIGH (ref 70–99)
Potassium: 4.3 mmol/L (ref 3.5–5.1)
Sodium: 140 mmol/L (ref 135–145)

## 2019-07-11 LAB — CBC
HCT: 36.9 % — ABNORMAL LOW (ref 39.0–52.0)
Hemoglobin: 10.9 g/dL — ABNORMAL LOW (ref 13.0–17.0)
MCH: 25.4 pg — ABNORMAL LOW (ref 26.0–34.0)
MCHC: 29.5 g/dL — ABNORMAL LOW (ref 30.0–36.0)
MCV: 86 fL (ref 80.0–100.0)
Platelets: 224 10*3/uL (ref 150–400)
RBC: 4.29 MIL/uL (ref 4.22–5.81)
RDW: 15.9 % — ABNORMAL HIGH (ref 11.5–15.5)
WBC: 5 10*3/uL (ref 4.0–10.5)
nRBC: 0 % (ref 0.0–0.2)

## 2019-07-11 LAB — BRAIN NATRIURETIC PEPTIDE: B Natriuretic Peptide: 277.2 pg/mL — ABNORMAL HIGH (ref 0.0–100.0)

## 2019-07-11 MED ORDER — FUROSEMIDE 80 MG PO TABS: 80.0000 mg | ORAL_TABLET | ORAL | 6 refills | Status: DC

## 2019-07-11 NOTE — Progress Notes (Signed)
ReDS Vest / Clip - 07/11/19 1100      ReDS Vest / Clip   Station Marker  C    Ruler Value  28    ReDS Value Range  (!) High volume overload    ReDS Actual Value  54    Anatomical Comments  sitting

## 2019-07-11 NOTE — Patient Instructions (Signed)
Labs today We will only contact you if something comes back abnormal or we need to make some changes. Otherwise no news is good news!  Your physician has requested that you have an echocardiogram. Echocardiography is a painless test that uses sound waves to create images of your heart. It provides your doctor with information about the size and shape of your heart and how well your heart's chambers and valves are working. This procedure takes approximately one hour. There are no restrictions for this procedure.  Your physician recommends that you schedule a follow-up appointment in: 4 months with Dr Haroldine Laws  Please call office at [phone] option 2 if you have any questions or concerns.   At the [facility], you and your health needs are our priority. As part of our continuing mission to provide you with exceptional heart care, we have created designated Provider Care Teams. These Care Teams include your primary Cardiologist (physician) and Advanced Practice Providers (APPs- Physician Assistants and Nurse Practitioners) who all work together to provide you with the care you need, when you need it.   You may see any of the following providers on your designated Care Team at your next follow up: [name] Dr [name] . Dr Loralie Champagne . Darrick Grinder, NP . Lyda Jester, PA . Audry Riles, PharmD   Please be sure to bring in all your medications bottles to every appointment.

## 2019-07-11 NOTE — Progress Notes (Signed)
Advanced Heart Failure Clinic Note   Referring Physician: PCP: [name], MD PCP-Cardiologist: Sanda Klein, MD  [facility]: Dr. 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  Admitted 05/16/19 for PVI with successful procedure. Post procedure he developed cardiogenic shock and respiratory failure. CCM consulted for intubation. Advanced HF team consulted. As he improved he was extubated.  Treated with pressors and gradually weaned off. HF meds initiated but unable to treat w/ ARB/ARNI nor spiro due to AKI, SCr peaking to 2.2. Tolerated Bidil and digoxin. ECHO initially showed EF 10% but improved 35-40% prior to discharge. Was discharged home on 05/29/19.    Here for routine f/u. Says he feels great. Walking regularly and riding exercise bike. Says he hasn't felt this good since he was in his 4s. No CP, SOB, palpitations or edema. No bleeding on Eliquis     Past Medical History:  Diagnosis Date  . AORTIC STENOSIS, MODERATE 12/05/2009  . Atrial fibrillation ([place])   . Atrial fibrillation with RVR ([place]) 07/05/2017  . Atrial flutter ([place]) 12/05/2009  . Atypical chest pain 11/08/2011  . BENIGN PROSTATIC HYPERTROPHY, HX OF 12/05/2009  . CHEST PAIN-UNSPECIFIED 11/11/2009  . CHF 12/05/2009  . Chronic renal insufficiency 12/16/2016  . Debility 04/18/2012  . Diarrhea 12/16/2016  . DYSPNEA 12/19/2009  . Edema 04/18/2012  . ERECTILE DYSFUNCTION 01/25/2007  . FATIGUE, CHRONIC 11/11/2009  . Gout 07/10/2012  . Heart murmur   . History of kidney stones    "they passed" (07/05/2017)  . Hyperkalemia 04/18/2012  . Hyperlipidemia   . Hypertension   . HYPERTENSION 01/25/2007  . Hypoxia 05/05/2011  . Insomnia 05/13/2016  . INSOMNIA, HX OF 01/25/2007  . Knee pain, bilateral 12/28/2010  . Knee pain, right 12/28/2010  . NEUROMA 01/05/2010  . OBESITY NOS 01/25/2007  . OSA on CPAP 04/06/2010  . PERIPHERAL NEUROPATHY 01/05/2010  .  PULMONARY FUNCTION TESTS, ABNORMAL 02/02/2010  . Superficial thrombophlebitis of left leg 09/07/2011  . TESTICULAR HYPOFUNCTION 01/05/2010  . 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 Toe pain, right 07/10/2012  . Type II diabetes mellitus ([place]) 12/05/2009  . UNSPECIFIED ANEMIA 01/05/2010    Current Outpatient Medications  Medication Sig Dispense Refill  . albuterol (VENTOLIN HFA) 108 (90 Base) MCG/ACT inhaler Inhale 2 puffs into the lungs every 6 (six) hours as needed for wheezing or shortness of breath. 1 Inhaler 6  . allopurinol (ZYLOPRIM) 300 MG tablet Take 1 tablet by mouth once daily 90 tablet 0  . amiodarone (PACERONE) 200 MG tablet Take 200 mg by mouth daily.    . Blood Glucose Monitoring Suppl (ONETOUCH VERIO) w/Device KIT Use to check blood sugar TID as directed.  Dx Code: E11.9 1 kit 0  . cetirizine (ZYRTEC) 10 MG tablet Take 1 tablet (10 mg total) by mouth daily. 30 tablet 11  . Clobetasol Prop Emollient Base (CLOBETASOL PROPIONATE E) 0.05 % emollient cream Apply 1 application topically 2 (two) times daily as needed (insect bite). 60 g 1  . Clobetasol Propionate Emulsion (TOVET) 0.05 % topical foam Apply topically.    . colchicine (COLCRYS) 0.6 MG tablet TAKE ONE TABLET BY MOUTH DAILY AS NEEDED FOR GOUT FLARE UPS. 30 tablet 0  . ELIQUIS 5 MG TABS tablet Take 1 tablet by mouth twice daily 60 tablet 10  . fish oil-omega-3 fatty acids 1000 MG capsule Take 1 g by mouth daily.     [name]  furosemide (LASIX) 80 MG tablet Take 1 tablet (80 mg total) by mouth daily before breakfast. 30 tablet 6  . glucose blood test strip (Verio) Use as instructed Check blood sugars TID DxE11.9 100 each 12  . glyBURIDE (DIABETA) 5 MG tablet 1 tab po q am and 1/2 tab po q pm 135 tablet 1  . hydrALAZINE (APRESOLINE) 25 MG tablet Take 1 tablet (25 mg total) by mouth 3 (three) times daily. 90 tablet 6  . HYDROcodone-acetaminophen (NORCO) 10-325 MG tablet Take 1 tablet by mouth every 8 (eight) hours as needed for moderate pain or severe  pain. 90 tablet 0  . isosorbide mononitrate (IMDUR) 30 MG 24 hr tablet Take 1 tablet (30 mg total) by mouth daily. 30 tablet 11  . LORazepam (ATIVAN) 1 MG tablet Take 1 tablet (1 mg total) by mouth every 8 (eight) hours as needed for anxiety. 30 tablet 5  . magnesium oxide (MAG-OX) 400 MG tablet Take 1 tablet (400 mg total) by mouth daily. 30 tablet 11  . metoprolol succinate (TOPROL-XL) 25 MG 24 hr tablet Take 0.5 tablets (12.5 mg total) by mouth 2 (two) times daily. 30 tablet 6  . Multiple Vitamin (MULTIVITAMIN WITH MINERALS) TABS tablet Take 1 tablet by mouth daily.     . naproxen (NAPROSYN) 375 MG tablet Take 375 mg by mouth daily as needed (pain).     . potassium chloride SA (K-DUR,KLOR-CON) 20 MEQ tablet TAKE 1 TABLET BY MOUTH ONCE DAILY AND  EXTRA  TABLET  IF  WEIGHT  IS  ABOVE  210  LBS 180 tablet 2  . pravastatin (PRAVACHOL) 40 MG tablet Take 1 tablet by mouth once daily 90 tablet 3  . tamsulosin (FLOMAX) 0.4 MG CAPS capsule Take 1 capsule by mouth once daily 30 capsule 0  . temazepam (RESTORIL) 30 MG capsule Take 1 capsule (30 mg total) by mouth at bedtime. 30 capsule 5   No current facility-administered medications for this encounter.    Allergies  Allergen Reactions  . Sulfonamide Derivatives Other (See Comments)    unknown reaction.  Patient states had to go to the ER.   [name] Zolpidem Other (See Comments)    Excessive, prolonged sedation      Social History   Socioeconomic History  . Marital status: Married    Spouse name: Not on file  . Number of children: 4  . Years of education: 2  . Highest education level: High school graduate  Occupational History  . Occupation: retired    Fish farm manager: [employer]  Tobacco Use  . Smoking status: Former Smoker    Packs/day: 1.00    Years: 10.00    Pack years: 10.00    Quit date: 06/07/1980    Years since quitting: 39.1  . Smokeless tobacco: Never Used  Substance and Sexual Activity  . Alcohol use: No  . Drug use: No  . Sexual  activity: Not Currently  Other Topics Concern  . Not on file  Social History Narrative   Lives with male partner in a one story home.  His son lives there off and on.  Retired from [employer].  Education: high school.     Social Determinants of Health   Financial Resource Strain:   . Difficulty of Paying Living Expenses: Not on file  Food Insecurity:   . Worried About Charity fundraiser in the Last Year: Not on file  . Ran Out of Food in the Last Year: Not on file  Transportation Needs:   . Film/video editor (Medical): Not on file  . Lack of Transportation (Non-Medical): Not on file  Physical Activity:   . Days of Exercise per Week: Not on file  . Minutes of Exercise per Session: Not on file  Stress:   . Feeling of Stress : Not on file  Social Connections:   . Frequency of Communication with Friends and Family: Not on file  . Frequency of Social Gatherings with Friends and Family: Not on file  . Attends Religious Services: Not on file  . Active Member of Clubs or Organizations: Not on file  . Attends Archivist Meetings: Not on file  . Marital Status: Not on file  Intimate Partner Violence:   . Fear of Current or Ex-Partner: Not on file  . Emotionally Abused: Not on file  . Physically Abused: Not on file  . Sexually Abused: Not on file      Family History  Problem Relation Age of Onset  . Clotting disorder Mother   . Heart disease Mother        s/p MI  . Heart attack Mother   . Hypertension Mother   . Diabetes Mother   . Hyperlipidemia Mother   . Stroke Mother   . Cancer Father        ? lung  . Lung disease Father        smoker  . Diabetes Sister   . Hypertension Sister        smoker  . Leukemia Maternal Grandmother        ?  [name] Aneurysm Sister        brain  . Other Sister        clipped  . Seizures Sister        d/o w/aneurysm/ smoker    Vitals:   07/11/19 1025  BP: 114/60  Pulse: 95  SpO2: 99%  Weight: 86.9 kg (191 lb 9.6 oz)      PHYSICAL EXAM: General:  Well appearing. No resp difficulty HEENT: normal Neck: supple. no JVD. Carotids 2+ bilat; no bruits. No lymphadenopathy or thryomegaly appreciated. Cor: PMI nondisplaced. irregular rate & rhythm. 2/6 TR Lungs: clear Abdomen: soft, nontender, nondistended. No hepatosplenomegaly. No bruits or masses. Good bowel sounds. Extremities: no cyanosis, clubbing, rash, edema Neuro: alert & orientedx3, cranial nerves grossly intact. moves all 4 extremities w/o difficulty. Affect pleasant   ECG: NSR 90 1st degree AV block 260 ms, + PVCs Personally reviewed    ASSESSMENT & PLAN:  1. 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  EF 10% by TEE 12/9 likely due to tachy CM Initially on pressors after PVI. Pressors gradually weaned off - Limited ECHO completed on 05/28/19 and showed EF improving with SR, 40-45%.  -Doing great NYHA I-II. Volume status ok  - Change lasix from 18m daily to 86mMWF. Can take extra as needed - Continue Toprol 12.5 bid  - Continue Hydralazine + Imdur  - With advanced renal disease will not titrate further given soft BP - Repeat echo   2. Persistent AF - s/p successful PVI 12/9.  - Tikosyn off due to AKI and now on PO amiodarone,  - Maintaining NSRon EKG today  - Continue Eliquis. No bleeding - Continue Toprol XL  - Follow up in Afib and EP clinic (Dr. CaCurt Bearsas planned.   3. AKI  on CKD 3b - SCr peaked at 2.45. Suspect AKI 2/2 cardiogenic shock.  -Creatinine improving, 1.55 on labs 06/11/19 - Repeat today    [name], MD 07/11/19

## 2019-07-16 NOTE — Progress Notes (Signed)
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  History supplemented by referring provider note.  He was admitted to the hospital in December for ablation for his a fib.  His hospitalization was complicated by respiratory failure due to cardiogenic shock.  He was switched from Tikosyn to amiodarone.  Following this medication change, he developed tremor in his hands that affected activity such as writing.  TSH from 06/11/2019 was 1.91.  His dose of amiodarone was reduced and his tremor improved, although he still has some mild tremor.  No difficulty walking.  PAST MEDICAL HISTORY: Past Medical History:  Diagnosis Date  . AORTIC STENOSIS, MODERATE 12/05/2009  . Atrial fibrillation ([place])   . Atrial fibrillation with RVR ([place]) 07/05/2017  . Atrial flutter ([place]) 12/05/2009  . Atypical chest pain 11/08/2011  . BENIGN PROSTATIC HYPERTROPHY, HX OF 12/05/2009  . CHEST PAIN-UNSPECIFIED 11/11/2009  . CHF 12/05/2009  . Chronic renal insufficiency 12/16/2016  . Debility 04/18/2012  . Diarrhea 12/16/2016  . DYSPNEA 12/19/2009  . Edema 04/18/2012  . ERECTILE DYSFUNCTION 01/25/2007  . FATIGUE, CHRONIC 11/11/2009  . Gout 07/10/2012  . Heart murmur   . History of kidney stones    "they passed" (07/05/2017)  . Hyperkalemia 04/18/2012  . Hyperlipidemia   . Hypertension   . HYPERTENSION 01/25/2007  . Hypoxia 05/05/2011  . Insomnia 05/13/2016  . INSOMNIA, HX OF 01/25/2007  . Knee pain, bilateral 12/28/2010  . Knee pain, right 12/28/2010  . NEUROMA 01/05/2010  . OBESITY NOS 01/25/2007  . OSA on CPAP 04/06/2010  . PERIPHERAL NEUROPATHY 01/05/2010  . PULMONARY FUNCTION TESTS, ABNORMAL  02/02/2010  . Superficial thrombophlebitis of left leg 09/07/2011  . TESTICULAR HYPOFUNCTION 01/05/2010  . 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 Toe pain, right 07/10/2012  . Type II diabetes mellitus ([place]) 12/05/2009  . UNSPECIFIED ANEMIA 01/05/2010    MEDICATIONS: Current Outpatient Medications on File Prior to Visit  Medication Sig Dispense Refill  . albuterol (VENTOLIN HFA) 108 (90 Base) MCG/ACT inhaler Inhale 2 puffs into the lungs every 6 (six) hours as needed for wheezing or shortness of breath. 1 Inhaler 6  . allopurinol (ZYLOPRIM) 300 MG tablet Take 1 tablet by mouth once daily 90 tablet 0  . amiodarone (PACERONE) 200 MG tablet Take 200 mg by mouth daily.    . Blood Glucose Monitoring Suppl (ONETOUCH VERIO) w/Device KIT Use to check blood sugar TID as directed.  Dx Code: E11.9 1 kit 0  . cetirizine (ZYRTEC) 10 MG tablet Take 1 tablet (10 mg total) by mouth daily. 30 tablet 11  . Clobetasol Prop Emollient Base (CLOBETASOL PROPIONATE E) 0.05 % emollient cream Apply 1 application topically 2 (two) times daily as needed (insect bite). 60 g 1  . Clobetasol Propionate Emulsion (TOVET) 0.05 % topical foam Apply topically.    . colchicine (COLCRYS) 0.6 MG tablet TAKE ONE TABLET BY MOUTH DAILY AS NEEDED FOR GOUT FLARE UPS. 30 tablet 0  . ELIQUIS 5 MG TABS tablet Take 1 tablet by mouth twice daily 60 tablet 10  . fish oil-omega-3 fatty acids 1000 MG capsule Take 1 g by mouth daily.     . furosemide (LASIX) 80 MG tablet Take 1 tablet (80 mg  total) by mouth every Monday, Wednesday, and Friday. And 1 tab as needed for swelling or shortness of breath 30 tablet 6  . glucose blood test strip (Verio) Use as instructed Check blood sugars TID DxE11.9 100 each 12  . glyBURIDE (DIABETA) 5 MG tablet 1 tab po q am and 1/2 tab po q pm 135 tablet 1  . hydrALAZINE (APRESOLINE) 25 MG tablet Take 1 tablet (25 mg total) by mouth 3 (three) times daily. 90 tablet 6  . HYDROcodone-acetaminophen (NORCO) 10-325 MG tablet Take 1 tablet  by mouth every 8 (eight) hours as needed for moderate pain or severe pain. 90 tablet 0  . isosorbide mononitrate (IMDUR) 30 MG 24 hr tablet Take 1 tablet (30 mg total) by mouth daily. 30 tablet 11  . LORazepam (ATIVAN) 1 MG tablet Take 1 tablet (1 mg total) by mouth every 8 (eight) hours as needed for anxiety. 30 tablet 5  . magnesium oxide (MAG-OX) 400 MG tablet Take 1 tablet (400 mg total) by mouth daily. 30 tablet 11  . metoprolol succinate (TOPROL-XL) 25 MG 24 hr tablet Take 0.5 tablets (12.5 mg total) by mouth 2 (two) times daily. 30 tablet 6  . Multiple Vitamin (MULTIVITAMIN WITH MINERALS) TABS tablet Take 1 tablet by mouth daily.     . naproxen (NAPROSYN) 375 MG tablet Take 375 mg by mouth daily as needed (pain).     . potassium chloride SA (K-DUR,KLOR-CON) 20 MEQ tablet TAKE 1 TABLET BY MOUTH ONCE DAILY AND  EXTRA  TABLET  IF  WEIGHT  IS  ABOVE  210  LBS 180 tablet 2  . pravastatin (PRAVACHOL) 40 MG tablet Take 1 tablet by mouth once daily 90 tablet 3  . tamsulosin (FLOMAX) 0.4 MG CAPS capsule Take 1 capsule by mouth once daily 30 capsule 0  . temazepam (RESTORIL) 30 MG capsule Take 1 capsule (30 mg total) by mouth at bedtime. 30 capsule 5   No current facility-administered medications on file prior to visit.    ALLERGIES: Allergies  Allergen Reactions  . Sulfonamide Derivatives Other (See Comments)    unknown reaction.  Patient states had to go to the ER.   [name] Zolpidem Other (See Comments)    Excessive, prolonged sedation    FAMILY HISTORY: Family History  Problem Relation Age of Onset  . Clotting disorder Mother   . Heart disease Mother        s/p MI  . Heart attack Mother   . Hypertension Mother   . Diabetes Mother   . Hyperlipidemia Mother   . Stroke Mother   . Cancer Father        ? lung  . Lung disease Father        smoker  . Diabetes Sister   . Hypertension Sister        smoker  . Leukemia Maternal Grandmother        ?  [name] Aneurysm Sister        brain  .  Other Sister        clipped  . Seizures Sister        d/o w/aneurysm/ smoker    SOCIAL HISTORY: Social History   Socioeconomic History  . Marital status: Married    Spouse name: Not on file  . Number of children: 4  . Years of education: 88  . Highest education level: High school graduate  Occupational History  . Occupation: retired    Fish farm manager: [employer]  Tobacco Use  .  Smoking status: Former Smoker    Packs/day: 1.00    Years: 10.00    Pack years: 10.00    Quit date: 06/07/1980    Years since quitting: 39.1  . Smokeless tobacco: Never Used  Substance and Sexual Activity  . Alcohol use: No  . Drug use: No  . Sexual activity: Not Currently  Other Topics Concern  . Not on file  Social History Narrative   Lives with male partner in a one story home.  His son lives there off and on.  Retired from [employer].  Education: high school.     Social Determinants of Health   Financial Resource Strain:   . Difficulty of Paying Living Expenses: Not on file  Food Insecurity:   . Worried About Charity fundraiser in the Last Year: Not on file  . Ran Out of Food in the Last Year: Not on file  Transportation Needs:   . Lack of Transportation (Medical): Not on file  . Lack of Transportation (Non-Medical): Not on file  Physical Activity:   . Days of Exercise per Week: Not on file  . Minutes of Exercise per Session: Not on file  Stress:   . Feeling of Stress : Not on file  Social Connections:   . Frequency of Communication with Friends and Family: Not on file  . Frequency of Social Gatherings with Friends and Family: Not on file  . Attends Religious Services: Not on file  . Active Member of Clubs or Organizations: Not on file  . Attends Archivist Meetings: Not on file  . Marital Status: Not on file  Intimate Partner Violence:   . Fear of Current or Ex-Partner: Not on file  . Emotionally Abused: Not on file  . Physically Abused: Not on file  . Sexually Abused: Not  on file    REVIEW OF SYSTEMS: Constitutional: No fevers, chills, or sweats, no generalized fatigue, change in appetite Eyes: No visual changes, double vision, eye pain Ear, nose and throat: No hearing loss, ear pain, nasal congestion, sore throat Cardiovascular: No chest pain, palpitations Respiratory:  No shortness of breath at rest or with exertion, wheezes GastrointestinaI: No nausea, vomiting, diarrhea, abdominal pain, fecal incontinence Genitourinary:  No dysuria, urinary retention or frequency Musculoskeletal:  No neck pain, back pain Integumentary: No rash, pruritus, skin lesions Neurological: as above Psychiatric: No depression, insomnia, anxiety Endocrine: No palpitations, fatigue, diaphoresis, mood swings, change in appetite, change in weight, increased thirst Hematologic/Lymphatic:  No purpura, petechiae. Allergic/Immunologic: no itchy/runny eyes, nasal congestion, recent allergic reactions, rashes  PHYSICAL EXAM: Blood pressure 120/75, pulse 82, resp. rate 18, height '5\' 6"'$  (1.676 m), weight 188 lb (85.3 kg), SpO2 96 %. General: No acute distress.  Patient appears well-groomed.   Head:  Normocephalic/atraumatic Eyes:  Fundi examined but not visualized Neck: supple, no paraspinal tenderness, full range of motion Heart:  Regular rate and rhythm Lungs:  Clear to auscultation bilaterally Back: No paraspinal tenderness Neurological Exam: alert and oriented to person, place, and time. Attention span and concentration intact, recent and remote memory intact, fund of knowledge intact.  Speech fluent and not dysarthric, language intact.  CN II-XII intact. Bulk and tone normal, muscle strength 5/5 throughout.  Fine postural and kinetic tremor in both hands.  No bradykinesia.  Sensation to pinprick and vibration reduced in feet.  Deep tendon reflexes 2+ throughout, toes downgoing.  Finger to nose and heel to shin testing without dysmetria.  Gait normal, Romberg  negative.  IMPRESSION: Amiodarone-induced tremor.  Tremor is common side effect (seen in 1/3 to 2/3 of patients).  No other findings on exam to suggest alternative diagnosis.  He is doing well on lower dose and hopefully will be able to discontinue it in the near future.  Follow up as needed.  Metta Clines, DO  CC: Penni Homans, MD

## 2019-07-17 ENCOUNTER — Encounter: Payer: Self-pay | Admitting: Neurology

## 2019-07-17 ENCOUNTER — Ambulatory Visit (INDEPENDENT_AMBULATORY_CARE_PROVIDER_SITE_OTHER): Payer: Medicare Other | Admitting: Neurology

## 2019-07-17 ENCOUNTER — Other Ambulatory Visit: Payer: Self-pay

## 2019-07-17 VITALS — BP 120/75 | HR 82 | Resp 18 | Ht 66.0 in | Wt 188.0 lb

## 2019-07-17 DIAGNOSIS — G251 Drug-induced tremor: Secondary | ICD-10-CM

## 2019-07-17 NOTE — Patient Instructions (Signed)
I agree that the tremor is due to the amiodarone.

## 2019-07-20 ENCOUNTER — Encounter: Payer: Self-pay | Admitting: Cardiovascular Disease

## 2019-07-20 ENCOUNTER — Other Ambulatory Visit: Payer: Self-pay

## 2019-07-20 ENCOUNTER — Ambulatory Visit (INDEPENDENT_AMBULATORY_CARE_PROVIDER_SITE_OTHER): Payer: Medicare Other | Admitting: Cardiovascular Disease

## 2019-07-20 ENCOUNTER — Ambulatory Visit (HOSPITAL_COMMUNITY)
Admission: RE | Admit: 2019-07-20 | Discharge: 2019-07-20 | Disposition: A | Discharge status: Home / Self Care | Payer: Medicare Other | Source: Ambulatory Visit | Attending: Internal Medicine | Admitting: Internal Medicine

## 2019-07-20 VITALS — BP 116/78 | HR 85 | Temp 97.2°F | Ht 66.0 in | Wt 190.2 lb

## 2019-07-20 DIAGNOSIS — E669 Obesity, unspecified: Secondary | ICD-10-CM

## 2019-07-20 DIAGNOSIS — Z79899 Other long term (current) drug therapy: Secondary | ICD-10-CM

## 2019-07-20 DIAGNOSIS — I352 Nonrheumatic aortic (valve) stenosis with insufficiency: Secondary | ICD-10-CM

## 2019-07-20 DIAGNOSIS — N1832 Chronic kidney disease, stage 3b: Secondary | ICD-10-CM | POA: Diagnosis not present

## 2019-07-20 DIAGNOSIS — Z95818 Presence of other cardiac implants and grafts: Secondary | ICD-10-CM | POA: Diagnosis not present

## 2019-07-20 DIAGNOSIS — E1169 Type 2 diabetes mellitus with other specified complication: Secondary | ICD-10-CM

## 2019-07-20 DIAGNOSIS — I110 Hypertensive heart disease with heart failure: Secondary | ICD-10-CM | POA: Insufficient documentation

## 2019-07-20 DIAGNOSIS — E785 Hyperlipidemia, unspecified: Secondary | ICD-10-CM | POA: Insufficient documentation

## 2019-07-20 DIAGNOSIS — I5042 Chronic combined systolic (congestive) and diastolic (congestive) heart failure: Secondary | ICD-10-CM

## 2019-07-20 DIAGNOSIS — Z87891 Personal history of nicotine dependence: Secondary | ICD-10-CM | POA: Diagnosis not present

## 2019-07-20 DIAGNOSIS — I10 Essential (primary) hypertension: Secondary | ICD-10-CM | POA: Diagnosis not present

## 2019-07-20 DIAGNOSIS — G4733 Obstructive sleep apnea (adult) (pediatric): Secondary | ICD-10-CM | POA: Diagnosis not present

## 2019-07-20 DIAGNOSIS — I4892 Unspecified atrial flutter: Secondary | ICD-10-CM | POA: Insufficient documentation

## 2019-07-20 DIAGNOSIS — E119 Type 2 diabetes mellitus without complications: Secondary | ICD-10-CM | POA: Insufficient documentation

## 2019-07-20 DIAGNOSIS — I351 Nonrheumatic aortic (valve) insufficiency: Secondary | ICD-10-CM | POA: Insufficient documentation

## 2019-07-20 DIAGNOSIS — I4891 Unspecified atrial fibrillation: Secondary | ICD-10-CM | POA: Insufficient documentation

## 2019-07-20 DIAGNOSIS — Z5181 Encounter for therapeutic drug level monitoring: Secondary | ICD-10-CM | POA: Diagnosis not present

## 2019-07-20 DIAGNOSIS — I4819 Other persistent atrial fibrillation: Secondary | ICD-10-CM | POA: Diagnosis not present

## 2019-07-20 MED ORDER — MAGNESIUM OXIDE 400 MG PO TABS: 400.0000 mg | ORAL_TABLET | Freq: Two times a day (BID) | ORAL | 11 refills | Status: AC

## 2019-07-20 NOTE — Progress Notes (Signed)
Cardiology Office Note    Date:  07/21/2019   ID:  Clayson, Riling [birth year], MRN [id number]  PCP:  [name], MD  Cardiologist:   Sanda Klein, MD   No chief complaint on file.   History of Present Illness:  [name] is a 69 y.o. male with aortic insufficiency, combined systolic and diastolic heart failure, paroxysmal atrial fibrillation and atrial flutter, obstructive sleep apnea, essential hypertension dyslipidemia in the setting of obesity and type 2 diabetes mellitus.  [name] is doing much better.  He denies dyspnea with usual activity (has NYHA functional class I-2) and does not have edema on his current regimen of furosemide 3 days a week.  Does not have orthopnea, PND, dizziness, syncope, bleeding or focal neurological events.  He has 100% compliance with CPAP and with his anticoagulant.  His weight has been very steady at 881-184 pounds.  Glycemic control has been excellent, morning fasting blood sugars consistently less than 120 and sometimes his morning blood sugar is in the 60s.  He has an appointment Monday with Dr. Randel Pigg.  He was very ill at the end of 2020 with severe tachycardia related cardiomyopathy, severely depressed left ventricular systolic function in the setting of atrial flutter with rapid ventricular rates.  He underwent ablation with Will Camnitz, transition from dofetilide to amiodarone and then was hospitalized for optimization of his heart failure.  A loop recorder was implanted as part of a CHF trial.  He has never really been aware of the palpitations, so episodes of breakthrough arrhythmia have often gone undetected.  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  Most recently in January 2019 his ejection fraction dropped to 30-35% when he was unaware of the arrhythmia.  He underwent cardioversion and ranolazine was added to dofetilide with a return to sinus rhythm, improved functional status and  improvement in left ventricular ejection fraction to 42% by echo performed in April 2019  In early 2020 he had an upper respiratory infection from which he never felt that he fully rebounded.  He noticed persistent tachycardia throughout the summer 2020 he underwent successful cardioversion on February 27, 2019 but had early recurrence of atrial fibrillation despite increased dose of dofetilide.  His clinical status steadily deteriorated.  On May 16, 2019 TEE showed LVEF was down to 10%, with severe biatrial dilation and moderate aortic insufficiency, mild mitral insufficiency.  The same day he underwent A. fib/a flutter ablation by Dr. Curt Bears and a right heart catheterization by Dr. Haroldine Laws (RA pressure 15, PA pressure 49/32, wedge pressure mean 27, cardiac index 2.8).  His echocardiograms have shown moderate aortic insufficiency related to aortic ectasia.  He has a long-standing history of severe hypertension. He also has long-standing history of obstructive sleep apnea which was also not treated until recently, but he is now 100% CPAP-compliant. Coronary angiography 2011 did not show evidence of significant stenoses. He presented with typical atrial flutter in 2011 and has recurrent paroxysmal atrial fibrillation with a good response to treatment with dofetilide, but the dose of dofetilide was decreased when his renal function deteriorated in 2020. Additional problems include obesity, type 2 diabetes mellitus, gout and androgen deficiency. He had a successful cardioversion in October 2016 and February 2019 for persistent atrial fibrillation.   Past Medical History:  Diagnosis Date  . AORTIC STENOSIS, MODERATE 12/05/2009  . Atrial fibrillation ([place])   . Atrial fibrillation with RVR ([place]) 07/05/2017  . Atrial flutter ([place]) 12/05/2009  .  Atypical chest pain 11/08/2011  . BENIGN PROSTATIC HYPERTROPHY, HX OF 12/05/2009  . CHEST PAIN-UNSPECIFIED 11/11/2009  . CHF 12/05/2009  . Chronic renal insufficiency  12/16/2016  . Debility 04/18/2012  . Diarrhea 12/16/2016  . DYSPNEA 12/19/2009  . Edema 04/18/2012  . ERECTILE DYSFUNCTION 01/25/2007  . FATIGUE, CHRONIC 11/11/2009  . Gout 07/10/2012  . Heart murmur   . History of kidney stones    "they passed" (07/05/2017)  . Hyperkalemia 04/18/2012  . Hyperlipidemia   . Hypertension   . HYPERTENSION 01/25/2007  . Hypoxia 05/05/2011  . Insomnia 05/13/2016  . INSOMNIA, HX OF 01/25/2007  . Knee pain, bilateral 12/28/2010  . Knee pain, right 12/28/2010  . NEUROMA 01/05/2010  . OBESITY NOS 01/25/2007  . OSA on CPAP 04/06/2010  . PERIPHERAL NEUROPATHY 01/05/2010  . PULMONARY FUNCTION TESTS, ABNORMAL 02/02/2010  . Superficial thrombophlebitis of left leg 09/07/2011  . TESTICULAR HYPOFUNCTION 01/05/2010  . 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 Toe pain, right 07/10/2012  . Type II diabetes mellitus ([place]) 12/05/2009  . UNSPECIFIED ANEMIA 01/05/2010    Past Surgical History:  Procedure Laterality Date  . A-FLUTTER ABLATION N/A 05/16/2019   Procedure: A-FLUTTER ABLATION;  Surgeon: Constance Haw, MD;  Location: [facility];  Service: Cardiovascular;  Laterality: N/A;  . ATRIAL FIBRILLATION ABLATION N/A 05/16/2019   Procedure: ATRIAL FIBRILLATION ABLATION;  Surgeon: Constance Haw, MD;  Location: [facility];  Service: Cardiovascular;  Laterality: N/A;  . CARDIAC CATHETERIZATION  10/16/2009   nonischemic cardiomyopathy  . CARDIOVERSION N/A 03/25/2015   Procedure: CARDIOVERSION;  Surgeon: Pixie Casino, MD;  Location: [facility];  Service: Cardiovascular;  Laterality: N/A;  . CARDIOVERSION N/A 06/17/2017   Procedure: CARDIOVERSION;  Surgeon: Sanda Klein, MD;  Location: [facility];  Service: Cardiovascular;  Laterality: N/A;  . CARDIOVERSION N/A 07/08/2017   Procedure: CARDIOVERSION;  Surgeon: Fay Records, MD;  Location: [facility];  Service: Cardiovascular;  Laterality: N/A;  . CARDIOVERSION N/A 02/27/2019   Procedure: CARDIOVERSION;  Surgeon:  Sanda Klein, MD;  Location: [facility];  Service: Cardiovascular;  Laterality: N/A;  . METATARSAL OSTEOTOMY Bilateral ~ 1980   removed part of 5th metatarsal to corect curvature of toes   . RIGHT HEART CATH N/A 05/16/2019   Procedure: RIGHT HEART CATH;  Surgeon: Jolaine Artist, MD;  Location: [facility];  Service: Cardiovascular;  Laterality: N/A;  . TEE WITHOUT CARDIOVERSION  05/16/2019   Procedure: Transesophageal Echocardiogram (Tee);  Surgeon: Constance Haw, MD;  Location: [facility];  Service: Cardiovascular;;    Current Medications: Outpatient Medications Prior to Visit  Medication Sig Dispense Refill  . albuterol (VENTOLIN HFA) 108 (90 Base) MCG/ACT inhaler Inhale 2 puffs into the lungs every 6 (six) hours as needed for wheezing or shortness of breath. 1 Inhaler 6  . allopurinol (ZYLOPRIM) 300 MG tablet Take 1 tablet by mouth once daily 90 tablet 0  . amiodarone (PACERONE) 200 MG tablet Take 200 mg by mouth daily.    . Blood Glucose Monitoring Suppl (ONETOUCH VERIO) w/Device KIT Use to check blood sugar TID as directed.  Dx Code: E11.9 1 kit 0  . cetirizine (ZYRTEC) 10 MG tablet Take 1 tablet (10 mg total) by mouth daily. 30 tablet 11  . Clobetasol Prop Emollient Base (CLOBETASOL PROPIONATE E) 0.05 % emollient cream Apply 1 application topically 2 (two) times daily as needed (insect bite). 60 g 1  . Clobetasol Propionate Emulsion (TOVET) 0.05 % topical foam Apply  topically.    . colchicine (COLCRYS) 0.6 MG tablet TAKE ONE TABLET BY MOUTH DAILY AS NEEDED FOR GOUT FLARE UPS. 30 tablet 0  . ELIQUIS 5 MG TABS tablet Take 1 tablet by mouth twice daily 60 tablet 10  . fish oil-omega-3 fatty acids 1000 MG capsule Take 1 g by mouth daily.     . furosemide (LASIX) 80 MG tablet Take 1 tablet (80 mg total) by mouth every Monday, Wednesday, and Friday. And 1 tab as needed for swelling or shortness of breath 30 tablet 6  . glucose blood test strip (Verio) Use as  instructed Check blood sugars TID DxE11.9 100 each 12  . glyBURIDE (DIABETA) 5 MG tablet 1 tab po q am and 1/2 tab po q pm 135 tablet 1  . hydrALAZINE (APRESOLINE) 25 MG tablet Take 1 tablet (25 mg total) by mouth 3 (three) times daily. 90 tablet 6  . HYDROcodone-acetaminophen (NORCO) 10-325 MG tablet Take 1 tablet by mouth every 8 (eight) hours as needed for moderate pain or severe pain. 90 tablet 0  . isosorbide mononitrate (IMDUR) 30 MG 24 hr tablet Take 1 tablet (30 mg total) by mouth daily. 30 tablet 11  . LORazepam (ATIVAN) 1 MG tablet Take 1 tablet (1 mg total) by mouth every 8 (eight) hours as needed for anxiety. 30 tablet 5  . metoprolol succinate (TOPROL-XL) 25 MG 24 hr tablet Take 0.5 tablets (12.5 mg total) by mouth 2 (two) times daily. 30 tablet 6  . Multiple Vitamin (MULTIVITAMIN WITH MINERALS) TABS tablet Take 1 tablet by mouth daily.     . naproxen (NAPROSYN) 375 MG tablet Take 375 mg by mouth daily as needed (pain).     . potassium chloride SA (K-DUR,KLOR-CON) 20 MEQ tablet TAKE 1 TABLET BY MOUTH ONCE DAILY AND  EXTRA  TABLET  IF  WEIGHT  IS  ABOVE  210  LBS 180 tablet 2  . pravastatin (PRAVACHOL) 40 MG tablet Take 1 tablet by mouth once daily 90 tablet 3  . tamsulosin (FLOMAX) 0.4 MG CAPS capsule Take 1 capsule by mouth once daily 30 capsule 0  . temazepam (RESTORIL) 30 MG capsule Take 1 capsule (30 mg total) by mouth at bedtime. 30 capsule 5  . magnesium oxide (MAG-OX) 400 MG tablet Take 1 tablet (400 mg total) by mouth daily. 30 tablet 11   No facility-administered medications prior to visit.     Allergies:   Sulfonamide derivatives and Zolpidem   Social History   Socioeconomic History  . Marital status: Married    Spouse name: Not on file  . Number of children: 4  . Years of education: 78  . Highest education level: High school graduate  Occupational History  . Occupation: retired    Fish farm manager: [employer]  Tobacco Use  . Smoking status: Former Smoker    Packs/day:  1.00    Years: 10.00    Pack years: 10.00    Quit date: 06/07/1980    Years since quitting: 39.1  . Smokeless tobacco: Never Used  Substance and Sexual Activity  . Alcohol use: No  . Drug use: No  . Sexual activity: Not Currently  Other Topics Concern  . Not on file  Social History Narrative   Lives with male partner in a one story home.  His son lives there off and on.  Retired from [employer].  Education: high school.  Right handed   Social Determinants of Health   Financial Resource Strain:   .  Difficulty of Paying Living Expenses: Not on file  Food Insecurity:   . Worried About Charity fundraiser in the Last Year: Not on file  . Ran Out of Food in the Last Year: Not on file  Transportation Needs:   . Lack of Transportation (Medical): Not on file  . Lack of Transportation (Non-Medical): Not on file  Physical Activity:   . Days of Exercise per Week: Not on file  . Minutes of Exercise per Session: Not on file  Stress:   . Feeling of Stress : Not on file  Social Connections:   . Frequency of Communication with Friends and Family: Not on file  . Frequency of Social Gatherings with Friends and Family: Not on file  . Attends Religious Services: Not on file  . Active Member of Clubs or Organizations: Not on file  . Attends Archivist Meetings: Not on file  . Marital Status: Not on file     Family History:  The patient's family history includes Aneurysm in his sister; Cancer in his father; Clotting disorder in his mother; Diabetes in his mother and sister; Heart attack in his mother; Heart disease in his mother; Hyperlipidemia in his mother; Hypertension in his mother and sister; Leukemia in his maternal grandmother; Lung disease in his father; Other in his sister; Seizures in his sister; Stroke in his mother.   ROS:   Please see the history of present illness.    ROS All other systems are reviewed and are negative.   PHYSICAL EXAM:   VS:  BP 116/78   Pulse 85    Temp (!) 97.2 F (36.2 C)   Ht '5\' 6"'$  (1.676 m)   Wt 190 lb 3.2 oz (86.3 kg)   SpO2 94%   BMI 30.70 kg/m       General: Alert, oriented x3, no distress, borderline obese Head: no evidence of trauma, PERRL, EOMI, no exophtalmos or lid lag, no myxedema, no xanthelasma; normal ears, nose and oropharynx Neck: normal jugular venous pulsations and no hepatojugular reflux; brisk carotid pulses without delay and no carotid bruits Chest: clear to auscultation, no signs of consolidation by percussion or palpation, normal fremitus, symmetrical and full respiratory excursions Cardiovascular: normal position and quality of the apical impulse, regular rhythm, normal first and second heart sounds, no rubs or gallops.  2/6 early peaking aortic ejection murmur, 3/6 decrescendo diastolic murmur of aortic insufficiency, both heard best at the right lower sternal border Abdomen: no tenderness or distention, no masses by palpation, no abnormal pulsatility or arterial bruits, normal bowel sounds, no hepatosplenomegaly Extremities: no clubbing, cyanosis or edema; 2+ radial, ulnar and brachial pulses bilaterally; 2+ right femoral, posterior tibial and dorsalis pedis pulses; 2+ left femoral, posterior tibial and dorsalis pedis pulses; no subclavian or femoral bruits Neurological: grossly nonfocal Psych: Normal mood and affect   Wt Readings from Last 3 Encounters:  07/20/19 190 lb 3.2 oz (86.3 kg)  07/17/19 188 lb (85.3 kg)  07/11/19 191 lb 9.6 oz (86.9 kg)   Studies/Labs Reviewed:   EKG:  EKG is ordered today.  It shows sinus rhythm with first-degree AV block (PR 264 ms), frequent PVCs and ventricular couplets, QTC 480 ms shows atrial flutter with variable AV block and rapid ventricular response, the QRS is borderline prolonged at about 100 ms, QTC hard to measure accurately probably around 500 ms  Lipid Panel    Component Value Date/Time   CHOL 134 06/11/2019 1106   TRIG 83.0 06/11/2019 1106  HDL 41.50  06/11/2019 1106   CHOLHDL 3 06/11/2019 1106   VLDL 16.6 06/11/2019 1106   LDLCALC 76 06/11/2019 1106   LDLDIRECT 84.0 05/02/2018 1523     ASSESSMENT:    1. Chronic combined systolic and diastolic heart failure (HCC)   2. Persistent atrial fibrillation ([place])   3. Nonrheumatic aortic insufficiency with aortic stenosis   4. OSA (obstructive sleep apnea)   5. Mild obesity   6. Essential hypertension, malignant   7. Encounter for monitoring amiodarone therapy   8. Stage 3b chronic kidney disease   9. Status post placement of implantable loop recorder      PLAN:  In order of problems listed above:  1. CHF (combined systolic and diastolic): Greatly improved functional status (NYHA I-II) and he appears clinically euvolemic.  He has had multiple episodes of waxing and waning LVEF related to tachycardia cardiomyopathy.  He is currently on hydralazine nitrates and a moderate dose of furosemide in addition to metoprolol, but is not receiving RAAS inhibitors.  His most recent creatinine was 1.78 on 07/11/2019. 2. AFib: In sinus rhythm today.  On amiodarone.  Underwent A. fib/a flutter ablation on May 16, 2019.  Has an appoint with Dr. Curt Bears next month.  On apixaban anticoagulation.  Never aware of palpitations. CHADSVAsc 3 (HTN, CHF, DM) on uninterrupted anticoagulation. 3. Moderate AI: He has a surprisingly loud murmur.  On multiple echoes this has always been moderate, never appeared severe enough to be the cause of cardiomyopathy.  Need to keep in mind that at some point we may still have to consider aortic valve replacement, especially if he does not have full recovery of left ventricular systolic function. 4. OSA: He reports 100% compliance with CPAP.  Denies daytime hypersomnolence. 5.  Overweight: Borderline obese, he still trying to lose weight. 6. HTN: Excellent control 7. Eliquis: 100% compliant, no bleeding complications 8.  Amiodarone: Reviewed the need for periodic liver and  thyroid function tests, ophthalmological exam, avoidance of sun exposure.  Needs to promptly report unexplained respiratory symptoms. 9. CKD 3: Stable GFR around 40.  Has some problems with constipation advised increasing the magnesium to twice daily.  Need to check a magnesium level with his next lab test. 10. DM: Exceptional glycemic control, appears that he may be at risk for hypoglycemia.  May need a reduction in his sulfonylurea dose.  He is no longer on Metformin.  Has an appointment with Dr. Randel Pigg next week 34. ILR: Implanted as part of a clinical heart failure trial, but also be very useful for monitoring of arrhythmia recurrence.  Medication Adjustments/Labs and Tests Ordered: Current medicines are reviewed at length with the patient today.  Concerns regarding medicines are outlined above.  Medication changes, Labs and Tests ordered today are listed in the Patient Instructions below. Patient Instructions  Medication Instructions:  INCREASE the Magnesium Oxide to 400 mg twice daily  *If you need a refill on your cardiac medications before your next appointment, please call your pharmacy*  Lab Work: None ordered If you have labs (blood work) drawn today and your tests are completely normal, you will receive your results only by: [name] MyChart Message (if you have MyChart) OR . A paper copy in the mail If you have any lab test that is abnormal or we need to change your treatment, we will call you to review the results.  Testing/Procedures: None ordered  Follow-Up: At [facility], you and your health needs are our priority.  As part of  our continuing mission to provide you with exceptional heart care, we have created designated Provider Care Teams.  These Care Teams include your primary Cardiologist (physician) and Advanced Practice Providers (APPs -  Physician Assistants and Nurse Practitioners) who all work together to provide you with the care you need, when you need it.  Your next  appointment:   6 month(s)  The format for your next appointment:   In Person  Provider:   Sanda Klein, MD       Signed, Sanda Klein, MD  07/21/2019 4:25 PM    [facility] [place], [place], [place]  [address] Phone: [phone]; Fax: [phone]

## 2019-07-20 NOTE — Progress Notes (Signed)
  Echocardiogram 2D Echocardiogram has been performed.  [name] 07/20/2019, 9:37 AM

## 2019-07-20 NOTE — Patient Instructions (Signed)
Medication Instructions:  INCREASE the Magnesium Oxide to 400 mg twice daily  *If you need a refill on your cardiac medications before your next appointment, please call your pharmacy*  Lab Work: None ordered If you have labs (blood work) drawn today and your tests are completely normal, you will receive your results only by: [name] MyChart Message (if you have MyChart) OR . A paper copy in the mail If you have any lab test that is abnormal or we need to change your treatment, we will call you to review the results.  Testing/Procedures: None ordered  Follow-Up: At [facility], you and your health needs are our priority.  As part of our continuing mission to provide you with exceptional heart care, we have created designated Provider Care Teams.  These Care Teams include your primary Cardiologist (physician) and Advanced Practice Providers (APPs -  Physician Assistants and Nurse Practitioners) who all work together to provide you with the care you need, when you need it.  Your next appointment:   6 month(s)  The format for your next appointment:   In Person  Provider:   Sanda Klein, MD

## 2019-07-22 ENCOUNTER — Other Ambulatory Visit: Payer: Self-pay | Admitting: Family Medicine

## 2019-07-22 DIAGNOSIS — D649 Anemia, unspecified: Secondary | ICD-10-CM

## 2019-07-22 DIAGNOSIS — E785 Hyperlipidemia, unspecified: Secondary | ICD-10-CM

## 2019-07-22 DIAGNOSIS — Z7901 Long term (current) use of anticoagulants: Secondary | ICD-10-CM

## 2019-07-22 DIAGNOSIS — M25562 Pain in left knee: Secondary | ICD-10-CM

## 2019-07-22 DIAGNOSIS — E875 Hyperkalemia: Secondary | ICD-10-CM

## 2019-07-22 DIAGNOSIS — G4700 Insomnia, unspecified: Secondary | ICD-10-CM

## 2019-07-22 DIAGNOSIS — M25561 Pain in right knee: Secondary | ICD-10-CM

## 2019-07-22 DIAGNOSIS — E669 Obesity, unspecified: Secondary | ICD-10-CM

## 2019-07-22 DIAGNOSIS — H409 Unspecified glaucoma: Secondary | ICD-10-CM

## 2019-07-22 DIAGNOSIS — E1169 Type 2 diabetes mellitus with other specified complication: Secondary | ICD-10-CM

## 2019-07-22 DIAGNOSIS — I10 Essential (primary) hypertension: Secondary | ICD-10-CM

## 2019-07-22 DIAGNOSIS — F411 Generalized anxiety disorder: Secondary | ICD-10-CM

## 2019-07-23 ENCOUNTER — Encounter: Payer: Self-pay | Admitting: Family Medicine

## 2019-07-23 ENCOUNTER — Ambulatory Visit (INDEPENDENT_AMBULATORY_CARE_PROVIDER_SITE_OTHER): Payer: Medicare Other | Admitting: Family Medicine

## 2019-07-23 ENCOUNTER — Other Ambulatory Visit: Payer: Self-pay

## 2019-07-23 VITALS — BP 124/72 | HR 88 | Temp 97.9°F | Resp 16 | Ht 71.0 in | Wt 193.4 lb

## 2019-07-23 DIAGNOSIS — I480 Paroxysmal atrial fibrillation: Secondary | ICD-10-CM

## 2019-07-23 DIAGNOSIS — I5042 Chronic combined systolic (congestive) and diastolic (congestive) heart failure: Secondary | ICD-10-CM

## 2019-07-23 DIAGNOSIS — E1169 Type 2 diabetes mellitus with other specified complication: Secondary | ICD-10-CM | POA: Diagnosis not present

## 2019-07-23 DIAGNOSIS — G4730 Sleep apnea, unspecified: Secondary | ICD-10-CM | POA: Diagnosis not present

## 2019-07-23 DIAGNOSIS — I2721 Secondary pulmonary arterial hypertension: Secondary | ICD-10-CM | POA: Diagnosis not present

## 2019-07-23 DIAGNOSIS — N289 Disorder of kidney and ureter, unspecified: Secondary | ICD-10-CM | POA: Diagnosis not present

## 2019-07-23 DIAGNOSIS — E669 Obesity, unspecified: Secondary | ICD-10-CM | POA: Diagnosis not present

## 2019-07-23 DIAGNOSIS — E782 Mixed hyperlipidemia: Secondary | ICD-10-CM

## 2019-07-23 DIAGNOSIS — D649 Anemia, unspecified: Secondary | ICD-10-CM

## 2019-07-23 LAB — CBC
HCT: 34 % — ABNORMAL LOW (ref 39.0–52.0)
Hemoglobin: 10.7 g/dL — ABNORMAL LOW (ref 13.0–17.0)
MCHC: 31.3 g/dL (ref 30.0–36.0)
MCV: 81.7 fl (ref 78.0–100.0)
Platelets: 230 10*3/uL (ref 150.0–400.0)
RBC: 4.16 Mil/uL — ABNORMAL LOW (ref 4.22–5.81)
RDW: 18.3 % — ABNORMAL HIGH (ref 11.5–15.5)
WBC: 4.7 10*3/uL (ref 4.0–10.5)

## 2019-07-23 LAB — COMPREHENSIVE METABOLIC PANEL
ALT: 23 U/L (ref 0–53)
AST: 17 U/L (ref 0–37)
Albumin: 4 g/dL (ref 3.5–5.2)
Alkaline Phosphatase: 56 U/L (ref 39–117)
BUN: 42 mg/dL — ABNORMAL HIGH (ref 6–23)
CO2: 27 mEq/L (ref 19–32)
Calcium: 9.6 mg/dL (ref 8.4–10.5)
Chloride: 104 mEq/L (ref 96–112)
Creatinine, Ser: 1.82 mg/dL — ABNORMAL HIGH (ref 0.40–1.50)
GFR: 45.25 mL/min — ABNORMAL LOW (ref >60.00)
Glucose, Bld: 162 mg/dL — ABNORMAL HIGH (ref 70–99)
Potassium: 4.8 mEq/L (ref 3.5–5.1)
Sodium: 139 mEq/L (ref 135–145)
Total Bilirubin: 0.7 mg/dL (ref 0.2–1.2)
Total Protein: 6.8 g/dL (ref 6.0–8.3)

## 2019-07-23 MED ORDER — LORAZEPAM 1 MG PO TABS: 1.0000 mg | ORAL_TABLET | Freq: Three times a day (TID) | ORAL | 5 refills | Status: DC | PRN

## 2019-07-23 MED ORDER — TEMAZEPAM 30 MG PO CAPS: 30.0000 mg | ORAL_CAPSULE | Freq: Every day | ORAL | 5 refills | Status: DC

## 2019-07-23 NOTE — Assessment & Plan Note (Signed)
He has done much better since returning home and they have decreased his lasix to M, W, F and he can take it other days if he has a spike in his weight and edema

## 2019-07-23 NOTE — Patient Instructions (Addendum)
[facility]ron Blood Pressure cuff, upper arm, want BP 100-140/60-90 Pulse oximeter, want oxygen in 90s  Weekly vitals  Take Multivitamin with minerals, selenium Vitamin D 1000-2000 IU daily Probiotic with lactobacillus and bifidophilus Asprin EC 81 mg daily, you do not have to take this  Melatonin 2-5 mg at bedtime  [email or web address]  Tylenol/Acetaminophen ES 500 mg tabs 1-2 up to 3 x a day to a max of 3000 mg in 24 hours  Encouraged increased hydration and fiber in diet. Daily probiotics. If bowels not moving can use MOM 2 tbls po in 4 oz of warm prune juice by mouth every 2-3 days. If no results then repeat in 4 hours with  Dulcolax suppository pr, may repeat again in 4 more hours as needed. Seek care if symptoms worsen. Consider daily Miralax and/or Dulcolax if symptoms persist. Can add Senna if needed  Mix Miralax with Benefiber together and drink once to twice a day

## 2019-07-23 NOTE — Assessment & Plan Note (Signed)
Rate controlled, tolerating meds,

## 2019-07-23 NOTE — Progress Notes (Signed)
Subjective:    Patient ID: [name], male    DOB: Aug 06, 1950, 69 y.o.   MRN: [id number]  Chief Complaint  Patient presents with  . Insomnia    Doing better on Temazepam  . Hyperlipidemia    follow up  . Tremors    Was evaluated by neurology for this on 07-17-2019    HPI Patient is in today for follow up on chronic medical concerns. No recent febrile illness and he has felt much better since returning home after his recent hospitalizations. Denies CP/palp/HA/congestion/fevers/GI or GU c/o. Taking meds as prescribed.   Past Medical History:  Diagnosis Date  . AORTIC STENOSIS, MODERATE 12/05/2009  . Atrial fibrillation ([place])   . Atrial fibrillation with RVR ([place]) 07/05/2017  . Atrial flutter ([place]) 12/05/2009  . Atypical chest pain 11/08/2011  . BENIGN PROSTATIC HYPERTROPHY, HX OF 12/05/2009  . CHEST PAIN-UNSPECIFIED 11/11/2009  . CHF 12/05/2009  . Chronic renal insufficiency 12/16/2016  . Debility 04/18/2012  . Diarrhea 12/16/2016  . DYSPNEA 12/19/2009  . Edema 04/18/2012  . ERECTILE DYSFUNCTION 01/25/2007  . FATIGUE, CHRONIC 11/11/2009  . Gout 07/10/2012  . Heart murmur   . History of kidney stones    "they passed" (07/05/2017)  . Hyperkalemia 04/18/2012  . Hyperlipidemia   . Hypertension   . HYPERTENSION 01/25/2007  . Hypoxia 05/05/2011  . Insomnia 05/13/2016  . INSOMNIA, HX OF 01/25/2007  . Knee pain, bilateral 12/28/2010  . Knee pain, right 12/28/2010  . NEUROMA 01/05/2010  . OBESITY NOS 01/25/2007  . OSA on CPAP 04/06/2010  . PERIPHERAL NEUROPATHY 01/05/2010  . PULMONARY FUNCTION TESTS, ABNORMAL 02/02/2010  . Superficial thrombophlebitis of left leg 09/07/2011  . TESTICULAR HYPOFUNCTION 01/05/2010  . 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 Toe pain, right 07/10/2012  . Type II diabetes mellitus ([place]) 12/05/2009  . UNSPECIFIED ANEMIA 01/05/2010    Past Surgical History:  Procedure Laterality Date  . A-FLUTTER ABLATION N/A 05/16/2019   Procedure: A-FLUTTER ABLATION;  Surgeon: Constance Haw,  MD;  Location: [facility];  Service: Cardiovascular;  Laterality: N/A;  . ATRIAL FIBRILLATION ABLATION N/A 05/16/2019   Procedure: ATRIAL FIBRILLATION ABLATION;  Surgeon: Constance Haw, MD;  Location: [facility];  Service: Cardiovascular;  Laterality: N/A;  . CARDIAC CATHETERIZATION  10/16/2009   nonischemic cardiomyopathy  . CARDIOVERSION N/A 03/25/2015   Procedure: CARDIOVERSION;  Surgeon: Pixie Casino, MD;  Location: [facility];  Service: Cardiovascular;  Laterality: N/A;  . CARDIOVERSION N/A 06/17/2017   Procedure: CARDIOVERSION;  Surgeon: Sanda Klein, MD;  Location: [facility];  Service: Cardiovascular;  Laterality: N/A;  . CARDIOVERSION N/A 07/08/2017   Procedure: CARDIOVERSION;  Surgeon: Fay Records, MD;  Location[facility];  Service: Cardiovascular;  Laterality: N/A;  . CARDIOVERSION N/A 02/27/2019   Procedure: CARDIOVERSION;  Surgeon: Sanda Klein, MD;  Location: [facility]OSCOPY;  Service: Cardiovascular;  Laterality: N/A;  . METATARSAL OSTEOTOMY Bilateral ~ 1980   removed part of 5th metatarsal to corect curvature of toes   . RIGHT HEART CATH N/A 05/16/2019   Procedure: RIGHT HEART CATH;  Surgeon: Jolaine Artist, MD;  Location: [facility];  Service: Cardiovascular;  Laterality: N/A;  . TEE WITHOUT CARDIOVERSION  05/16/2019   Procedure: Transesophageal Echocardiogram ([place]);  Surgeon: Constance Haw, MD;  Location: [facility];  Service: Cardiovascular;;    Family History  Problem Relation Age of Onset  . Clotting disorder Mother   . Heart disease Mother  s/p MI  . Heart attack Mother   . Hypertension Mother   . Diabetes Mother   . Hyperlipidemia Mother   . Stroke Mother   . Cancer Father        ? lung  . Lung disease Father        smoker  . Diabetes Sister   . Hypertension Sister        smoker  . Leukemia Maternal Grandmother        ?  [name] Aneurysm Sister        brain  . Other Sister        clipped  .  Seizures Sister        d/o w/aneurysm/ smoker    Social History   Socioeconomic History  . Marital status: Married    Spouse name: Not on file  . Number of children: 4  . Years of education: 29  . Highest education level: High school graduate  Occupational History  . Occupation: retired    Fish farm manager: [employer]  Tobacco Use  . Smoking status: Former Smoker    Packs/day: 1.00    Years: 10.00    Pack years: 10.00    Quit date: 06/07/1980    Years since quitting: 39.1  . Smokeless tobacco: Never Used  Substance and Sexual Activity  . Alcohol use: No  . Drug use: No  . Sexual activity: Not Currently  Other Topics Concern  . Not on file  Social History Narrative   Lives with male partner in a one story home.  His son lives there off and on.  Retired from [employer].  Education: high school.  Right handed   Social Determinants of Health   Financial Resource Strain:   . Difficulty of Paying Living Expenses: Not on file  Food Insecurity:   . Worried About Charity fundraiser in the Last Year: Not on file  . Ran Out of Food in the Last Year: Not on file  Transportation Needs:   . Lack of Transportation (Medical): Not on file  . Lack of Transportation (Non-Medical): Not on file  Physical Activity:   . Days of Exercise per Week: Not on file  . Minutes of Exercise per Session: Not on file  Stress:   . Feeling of Stress : Not on file  Social Connections:   . Frequency of Communication with Friends and Family: Not on file  . Frequency of Social Gatherings with Friends and Family: Not on file  . Attends Religious Services: Not on file  . Active Member of Clubs or Organizations: Not on file  . Attends Archivist Meetings: Not on file  . Marital Status: Not on file  Intimate Partner Violence:   . Fear of Current or Ex-Partner: Not on file  . Emotionally Abused: Not on file  . Physically Abused: Not on file  . Sexually Abused: Not on file    Outpatient Medications  Prior to Visit  Medication Sig Dispense Refill  . albuterol (VENTOLIN HFA) 108 (90 Base) MCG/ACT inhaler Inhale 2 puffs into the lungs every 6 (six) hours as needed for wheezing or shortness of breath. 1 Inhaler 6  . allopurinol (ZYLOPRIM) 300 MG tablet Take 1 tablet by mouth once daily 90 tablet 0  . amiodarone (PACERONE) 200 MG tablet Take 200 mg by mouth daily.    . Blood Glucose Monitoring Suppl (ONETOUCH VERIO) w/Device KIT Use to check blood sugar TID as directed.  Dx Code: E11.9 1  kit 0  . cetirizine (ZYRTEC) 10 MG tablet Take 1 tablet (10 mg total) by mouth daily. 30 tablet 11  . Clobetasol Prop Emollient Base (CLOBETASOL PROPIONATE E) 0.05 % emollient cream Apply 1 application topically 2 (two) times daily as needed (insect bite). 60 g 1  . colchicine (COLCRYS) 0.6 MG tablet TAKE ONE TABLET BY MOUTH DAILY AS NEEDED FOR GOUT FLARE UPS. 30 tablet 0  . ELIQUIS 5 MG TABS tablet Take 1 tablet by mouth twice daily 60 tablet 10  . fish oil-omega-3 fatty acids 1000 MG capsule Take 1 g by mouth daily.     . furosemide (LASIX) 80 MG tablet Take 1 tablet (80 mg total) by mouth every Monday, Wednesday, and Friday. And 1 tab as needed for swelling or shortness of breath 30 tablet 6  . glucose blood test strip (Verio) Use as instructed Check blood sugars TID DxE11.9 100 each 12  . glyBURIDE (DIABETA) 5 MG tablet 1 tab po q am and 1/2 tab po q pm 135 tablet 1  . hydrALAZINE (APRESOLINE) 25 MG tablet Take 1 tablet (25 mg total) by mouth 3 (three) times daily. 90 tablet 6  . HYDROcodone-acetaminophen (NORCO) 10-325 MG tablet Take 1 tablet by mouth every 8 (eight) hours as needed for moderate pain or severe pain. 90 tablet 0  . isosorbide mononitrate (IMDUR) 30 MG 24 hr tablet Take 1 tablet (30 mg total) by mouth daily. 30 tablet 11  . magnesium oxide (MAG-OX) 400 MG tablet Take 1 tablet (400 mg total) by mouth 2 (two) times daily. 30 tablet 11  . metoprolol succinate (TOPROL-XL) 25 MG 24 hr tablet Take 0.5  tablets (12.5 mg total) by mouth 2 (two) times daily. 30 tablet 6  . Multiple Vitamin (MULTIVITAMIN WITH MINERALS) TABS tablet Take 1 tablet by mouth daily.     . naproxen (NAPROSYN) 375 MG tablet Take 375 mg by mouth daily as needed (pain).     . potassium chloride SA (K-DUR,KLOR-CON) 20 MEQ tablet TAKE 1 TABLET BY MOUTH ONCE DAILY AND  EXTRA  TABLET  IF  WEIGHT  IS  ABOVE  210  LBS 180 tablet 2  . pravastatin (PRAVACHOL) 40 MG tablet Take 1 tablet by mouth once daily 90 tablet 3  . tamsulosin (FLOMAX) 0.4 MG CAPS capsule Take 1 capsule by mouth once daily 30 capsule 0  . Clobetasol Propionate Emulsion (TOVET) 0.05 % topical foam Apply topically.    [name] LORazepam (ATIVAN) 1 MG tablet Take 1 tablet (1 mg total) by mouth every 8 (eight) hours as needed for anxiety. 30 tablet 5  . temazepam (RESTORIL) 30 MG capsule Take 1 capsule (30 mg total) by mouth at bedtime. 30 capsule 5   No facility-administered medications prior to visit.    Allergies  Allergen Reactions  . Sulfonamide Derivatives Other (See Comments)    unknown reaction.  Patient states had to go to the ER.   [name] Zolpidem Other (See Comments)    Excessive, prolonged sedation    Review of Systems  Constitutional: Negative for fever and malaise/fatigue.  HENT: Negative for congestion.   Eyes: Negative for blurred vision.  Respiratory: Positive for shortness of breath.   Cardiovascular: Negative for chest pain, palpitations and leg swelling.  Gastrointestinal: Negative for abdominal pain, blood in stool and nausea.  Genitourinary: Negative for dysuria and frequency.  Musculoskeletal: Negative for falls.  Skin: Negative for rash.  Neurological: Negative for dizziness, loss of consciousness and headaches.  Endo/Heme/Allergies: Negative  for environmental allergies.  Psychiatric/Behavioral: Negative for depression. The patient is nervous/anxious and has insomnia.        Objective:    Physical Exam Vitals and nursing note  reviewed.  Constitutional:      General: He is not in acute distress.    Appearance: He is well-developed.  HENT:     Head: Normocephalic and atraumatic.     Nose: Nose normal.  Eyes:     General:        Right eye: No discharge.        Left eye: No discharge.  Cardiovascular:     Rate and Rhythm: Normal rate and regular rhythm.     Heart sounds: Murmur present.  Pulmonary:     Effort: Pulmonary effort is normal.     Breath sounds: Normal breath sounds.  Abdominal:     General: Bowel sounds are normal.     Palpations: Abdomen is soft.     Tenderness: There is no abdominal tenderness.  Musculoskeletal:     Cervical back: Normal range of motion and neck supple.  Skin:    General: Skin is warm and dry.  Neurological:     Mental Status: He is alert and oriented to person, place, and time.     BP 124/72 (BP Location: Right Arm, Patient Position: Sitting, Cuff Size: Small)   Pulse 88   Temp 97.9 F (36.6 C) (Temporal)   Resp 16   Ht '5\' 11"'$  (1.803 m)   Wt 193 lb 6.4 oz (87.7 kg)   SpO2 100%   BMI 26.97 kg/m  Wt Readings from Last 3 Encounters:  07/23/19 193 lb 6.4 oz (87.7 kg)  07/20/19 190 lb 3.2 oz (86.3 kg)  07/17/19 188 lb (85.3 kg)    Diabetic Foot Exam - Simple   No data filed     Lab Results  Component Value Date   WBC 4.7 07/23/2019   HGB 10.7 Repeated and verified X2. (L) 07/23/2019   HCT 34.0 (L) 07/23/2019   PLT 230.0 07/23/2019   GLUCOSE 162 (H) 07/23/2019   CHOL 134 06/11/2019   TRIG 83.0 06/11/2019   HDL 41.50 06/11/2019   LDLDIRECT 84.0 05/02/2018   LDLCALC 76 06/11/2019   ALT 23 07/23/2019   AST 17 07/23/2019   NA 139 07/23/2019   K 4.8 07/23/2019   CL 104 07/23/2019   CREATININE 1.82 (H) 07/23/2019   BUN 42 (H) 07/23/2019   CO2 27 07/23/2019   TSH 1.91 06/11/2019   PSA 10.76 (H) 02/06/2019   INR 2.2 09/06/2018   HGBA1C 6.4 06/11/2019   MICROALBUR 1.1 04/14/2015    Lab Results  Component Value Date   TSH 1.91 06/11/2019   Lab  Results  Component Value Date   WBC 4.7 07/23/2019   HGB 10.7 Repeated and verified X2. (L) 07/23/2019   HCT 34.0 (L) 07/23/2019   MCV 81.7 07/23/2019   PLT 230.0 07/23/2019   Lab Results  Component Value Date   NA 139 07/23/2019   K 4.8 07/23/2019   CO2 27 07/23/2019   GLUCOSE 162 (H) 07/23/2019   BUN 42 (H) 07/23/2019   CREATININE 1.82 (H) 07/23/2019   BILITOT 0.7 07/23/2019   ALKPHOS 56 07/23/2019   AST 17 07/23/2019   ALT 23 07/23/2019   PROT 6.8 07/23/2019   ALBUMIN 4.0 07/23/2019   CALCIUM 9.6 07/23/2019   ANIONGAP 10 07/11/2019   GFR 45.25 (L) 07/23/2019   Lab Results  Component Value Date  CHOL 134 06/11/2019   Lab Results  Component Value Date   HDL 41.50 06/11/2019   Lab Results  Component Value Date   LDLCALC 76 06/11/2019   Lab Results  Component Value Date   TRIG 83.0 06/11/2019   Lab Results  Component Value Date   CHOLHDL 3 06/11/2019   Lab Results  Component Value Date   HGBA1C 6.4 06/11/2019       Assessment & Plan:   Problem List Items Addressed This Visit    Diabetes mellitus type 2 in obese ([place])    hgba1c acceptable, minimize simple carbs. Increase exercise as tolerated. Continue current meds      Anemia - Primary   Relevant Orders   CBC (Completed)   Chronic combined systolic and diastolic heart failure ([place])    He has done much better since returning home and they have decreased his lasix to M, W, F and he can take it other days if he has a spike in his weight and edema      Hyperlipidemia    Encouraged heart healthy diet, increase exercise, avoid trans fats, consider a krill oil cap daily      PAF (paroxysmal atrial fibrillation) ([place])    Rate controlled, tolerating meds,        Other Visit Diagnoses    Renal insufficiency       Relevant Orders   Comprehensive metabolic panel (Completed)   Sleep apnea, unspecified type       Relevant Orders   Ambulatory referral to Pulmonology   Severe pulmonary arterial  systolic hypertension ([place])       Relevant Orders   Ambulatory referral to Pulmonology      I have discontinued Aayan H. Ruzich's Clobetasol Propionate Emulsion. I am also having him maintain his fish oil-omega-3 fatty acids, cetirizine, colchicine, albuterol, Clobetasol Prop Emollient Base, potassium chloride SA, HYDROcodone-acetaminophen, glucose blood, multivitamin with minerals, OneTouch Verio, Eliquis, pravastatin, allopurinol, naproxen, metoprolol succinate, hydrALAZINE, isosorbide mononitrate, glyBURIDE, tamsulosin, amiodarone, furosemide, magnesium oxide, LORazepam, and temazepam.  Meds ordered this encounter  Medications  . LORazepam (ATIVAN) 1 MG tablet    Sig: Take 1 tablet (1 mg total) by mouth every 8 (eight) hours as needed for anxiety.    Dispense:  30 tablet    Refill:  5  . temazepam (RESTORIL) 30 MG capsule    Sig: Take 1 capsule (30 mg total) by mouth at bedtime.    Dispense:  30 capsule    Refill:  5     Penni Homans, MD

## 2019-07-23 NOTE — Assessment & Plan Note (Signed)
Encouraged heart healthy diet, increase exercise, avoid trans fats, consider a krill oil cap daily 

## 2019-07-23 NOTE — Assessment & Plan Note (Signed)
hgba1c acceptable, minimize simple carbs. Increase exercise as tolerated. Continue current meds 

## 2019-07-25 ENCOUNTER — Telehealth: Payer: Self-pay | Admitting: Family Medicine

## 2019-07-25 MED ORDER — CLOBETASOL PROP EMOLLIENT BASE 0.05 % EX CREA: 1.0000 "application " | TOPICAL_CREAM | Freq: Two times a day (BID) | CUTANEOUS | 1 refills | Status: AC | PRN

## 2019-07-25 NOTE — Telephone Encounter (Signed)
Sent in Rx/thx dmf 

## 2019-07-25 NOTE — Telephone Encounter (Signed)
Medication: Clobetasol Prop Emollient Base (CLOBETASOL PROPIONATE E) 0.05 % emollient cream   Pt states this was discussed during OV with Dr. Charlett Blake 07/23/2019 but it has not been sent to pharmacy   Has the patient contacted their pharmacy? Yes - need RX  Preferred Pharmacy (with phone number or street name):  [facility] [name], [place] Phone:  [phone]  Fax:  [phone]

## 2019-07-30 ENCOUNTER — Telehealth: Payer: Self-pay | Admitting: Family Medicine

## 2019-07-30 NOTE — Telephone Encounter (Signed)
Caller Name: Bradyn Cruse Phone: [phone]  Pt states he has nagging cough and in addition to referral to Pulmonary he was supposed to have a chest xray. I do not see orders for chest xray. He said it was recommended by Dr. Charlett Blake. Please advise.  Provided pt phone # for Pulmonary.  Pt states medication was sent to wrong pharmacy. Please resend Clobetasol Prop Emollient Base (CLOBETASOL PROPIONATE E) 0.05 % emollient cream to: [facility], [place] Phone:  [phone]  Fax:  [phone]

## 2019-07-31 ENCOUNTER — Other Ambulatory Visit: Payer: Self-pay | Admitting: Family Medicine

## 2019-07-31 DIAGNOSIS — R059 Cough, unspecified: Secondary | ICD-10-CM

## 2019-07-31 DIAGNOSIS — R05 Cough: Secondary | ICD-10-CM

## 2019-07-31 NOTE — Progress Notes (Unsigned)
cxr 

## 2019-07-31 NOTE — Research (Addendum)
Alleviate Research Study  Patient doing well at this time, he states that "he feels a lot better"  Alleviate HF device is uploaded each night and strips have been reviewed by Dr. Sallyanne Kuster without complication. We will continue to monitor and follow up with the patient. No adverse events or concerns at this time.   Current Outpatient Medications:  .  albuterol (VENTOLIN HFA) 108 (90 Base) MCG/ACT inhaler, Inhale 2 puffs into the lungs every 6 (six) hours as needed for wheezing or shortness of breath., Disp: 1 Inhaler, Rfl: 6 .  allopurinol (ZYLOPRIM) 300 MG tablet, Take 1 tablet by mouth once daily, Disp: 90 tablet, Rfl: 0 .  amiodarone (PACERONE) 200 MG tablet, Take 200 mg by mouth daily., Disp: , Rfl:  .  Blood Glucose Monitoring Suppl (ONETOUCH VERIO) w/Device KIT, Use to check blood sugar TID as directed.  Dx Code: E11.9, Disp: 1 kit, Rfl: 0 .  cetirizine (ZYRTEC) 10 MG tablet, Take 1 tablet (10 mg total) by mouth daily., Disp: 30 tablet, Rfl: 11 .  Clobetasol Prop Emollient Base (CLOBETASOL PROPIONATE E) 0.05 % emollient cream, Apply 1 application topically 2 (two) times daily as needed (insect bite)., Disp: 60 g, Rfl: 1 .  colchicine (COLCRYS) 0.6 MG tablet, TAKE ONE TABLET BY MOUTH DAILY AS NEEDED FOR GOUT FLARE UPS., Disp: 30 tablet, Rfl: 0 .  ELIQUIS 5 MG TABS tablet, Take 1 tablet by mouth twice daily, Disp: 60 tablet, Rfl: 10 .  fish oil-omega-3 fatty acids 1000 MG capsule, Take 1 g by mouth daily. , Disp: , Rfl:  .  furosemide (LASIX) 80 MG tablet, Take 1 tablet (80 mg total) by mouth every Monday, Wednesday, and Friday. And 1 tab as needed for swelling or shortness of breath, Disp: 30 tablet, Rfl: 6 .  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  glyBURIDE (DIABETA) 5 MG tablet, 1 tab po q am and 1/2 tab po q pm, Disp: 135 tablet, Rfl: 1 .  hydrALAZINE (APRESOLINE) 25 MG tablet, Take 1 tablet (25 mg total) by mouth 3  (three) times daily., Disp: 90 tablet, Rfl: 6 .  HYDROcodone-acetaminophen (NORCO) 10-325 MG tablet, Take 1 tablet by mouth every 8 (eight) hours as needed for moderate pain or severe pain., Disp: 90 tablet, Rfl: 0 .  isosorbide mononitrate (IMDUR) 30 MG 24 hr tablet, Take 1 tablet (30 mg total) by mouth daily., Disp: 30 tablet, Rfl: 11 .  LORazepam (ATIVAN) 1 MG tablet, Take 1 tablet (1 mg total) by mouth every 8 (eight) hours as needed for anxiety., Disp: 30 tablet, Rfl: 5 .  magnesium oxide (MAG-OX) 400 MG tablet, Take 1 tablet (400 mg total) by mouth 2 (two) times daily., Disp: 30 tablet, Rfl: 11 .  metoprolol succinate (TOPROL-XL) 25 MG 24 hr tablet, Take 0.5 tablets (12.5 mg total) by mouth 2 (two) times daily., Disp: 30 tablet, Rfl: 6 .  Multiple Vitamin (MULTIVITAMIN WITH MINERALS) TABS tablet, Take 1 tablet by mouth daily. , Disp: , Rfl:  .  naproxen (NAPROSYN) 375 MG tablet, Take 375 mg by mouth daily as needed (pain). , Disp: , Rfl:  .  potassium chloride SA (K-DUR,KLOR-CON) 20 MEQ tablet, TAKE 1 TABLET BY MOUTH ONCE DAILY AND  EXTRA  TABLET  IF  WEIGHT  IS  ABOVE  210  LBS, Disp: 180 tablet, Rfl: 2 .  pravastatin (PRAVACHOL) 40 MG tablet, Take 1 tablet by mouth once  daily, Disp: 90 tablet, Rfl: 3 .  tamsulosin (FLOMAX) 0.4 MG CAPS capsule, Take 1 capsule by mouth once daily, Disp: 30 capsule, Rfl: 0 .  temazepam (RESTORIL) 30 MG capsule, Take 1 capsule (30 mg total) by mouth at bedtime., Disp: 30 capsule, Rfl: 5

## 2019-07-31 NOTE — Telephone Encounter (Signed)
I have ordered a cxr for him in the [place]

## 2019-08-01 ENCOUNTER — Ambulatory Visit (HOSPITAL_BASED_OUTPATIENT_CLINIC_OR_DEPARTMENT_OTHER)
Admission: RE | Admit: 2019-08-01 | Discharge: 2019-08-01 | Disposition: A | Discharge status: Home / Self Care | Payer: Medicare Other | Source: Ambulatory Visit | Attending: Family Medicine | Admitting: Family Medicine

## 2019-08-01 ENCOUNTER — Other Ambulatory Visit: Payer: Self-pay

## 2019-08-01 DIAGNOSIS — R059 Cough, unspecified: Secondary | ICD-10-CM

## 2019-08-01 DIAGNOSIS — R05 Cough: Secondary | ICD-10-CM | POA: Insufficient documentation

## 2019-08-01 NOTE — Telephone Encounter (Signed)
Called informed chest xray ordered. Medication taken care of.

## 2019-08-02 ENCOUNTER — Ambulatory Visit (INDEPENDENT_AMBULATORY_CARE_PROVIDER_SITE_OTHER): Payer: Medicare Other | Admitting: Adult Health

## 2019-08-02 ENCOUNTER — Encounter: Payer: Self-pay | Admitting: Adult Health

## 2019-08-02 VITALS — BP 114/68 | Temp 98.8°F | Ht 71.0 in | Wt 191.0 lb

## 2019-08-02 DIAGNOSIS — R0602 Shortness of breath: Secondary | ICD-10-CM

## 2019-08-02 DIAGNOSIS — G4733 Obstructive sleep apnea (adult) (pediatric): Secondary | ICD-10-CM

## 2019-08-02 DIAGNOSIS — I5042 Chronic combined systolic (congestive) and diastolic (congestive) heart failure: Secondary | ICD-10-CM

## 2019-08-02 LAB — BASIC METABOLIC PANEL
BUN: 39 mg/dL — ABNORMAL HIGH (ref 6–23)
CO2: 27 mEq/L (ref 19–32)
Calcium: 9.5 mg/dL (ref 8.4–10.5)
Chloride: 105 mEq/L (ref 96–112)
Creatinine, Ser: 1.9 mg/dL — ABNORMAL HIGH (ref 0.40–1.50)
GFR: 43.05 mL/min — ABNORMAL LOW (ref >60.00)
Glucose, Bld: 50 mg/dL — ABNORMAL LOW (ref 70–99)
Potassium: 4.1 mEq/L (ref 3.5–5.1)
Sodium: 140 mEq/L (ref 135–145)

## 2019-08-02 LAB — BRAIN NATRIURETIC PEPTIDE: Pro B Natriuretic peptide (BNP): 446 pg/mL — ABNORMAL HIGH (ref 0.0–100.0)

## 2019-08-02 NOTE — Assessment & Plan Note (Signed)
Excellent compliance however his control is not optimal.  Patient has significant number of central events.  With his worsening congestive heart failure over the last couple months suspect this is contributing.  Patient needs to be set up for a CPAP/BiPAP titration study.  Plan  Patient Instructions  Labs today .  Take extra Lasix 40mg  on Today (08/02/19) , Saturday (2/27)  and Sunday (2/28 ) . Continue on Lasix 80mg  Monday/Wednesday /Friday.  Low salt diet. Set up for CPAP /BIPAP titration study.  Increase CPAP pressure to Auto CPAP 8 to 16 cmH2O.  Continue on CPAP At bedtime   Goal is at least 6 hrs each night.  Do not drive if sleepy.  Work on healthy weight.  Record weight log.  Refer to CHF clinic .  Follow up with Dr. Elsworth Soho  Or Deneise Getty in 4 -6 weeks and As needed   Please contact office for sooner follow up if symptoms do not improve or worsen or seek emergency care

## 2019-08-02 NOTE — Progress Notes (Signed)
$'@Patient'A$  ID: [name], male    DOB: [birth year], 69 y.o.   MRN: [id number]  Chief Complaint  Patient presents with   Follow-up    Referring provider: [name], MD  HPI: 69 year old male followed for obstructive sleep apnea and insomnia Medical history significant for A. fib, nonischemic cardiomyopathy  TEST/EVENTS :   PSG dec '11>> - weighed 225 pounds,BMI of 31, neck size of 14 inches Sleep was very fragmented due to frequent arousals and awakenings, especially in the second half of the night with CPAP. He was desensitized with a standard nasal mask. Note that he took 2 tablets of temazepam of 15 mg prior to the study. >>AHI was 42/h with lowest desatn of 86 % (no centrals noted on original study  >>corrected by CPAP of 8 cm with a nasal mask. Download 12/28 -07/02/10 >>good compliance avg 6.5h, AHI 15/h, centrals 9/h, obstructive 4.5/h, avg pr 8 cm  Download on 10 cm 2/28-3/28/12 >>few residual events, some central apneas persist   PFT ->01/2010 - TLC 77%, DLCO 56%, FVC 3.5L/72%, RAtio 77 ,FEV 1 78%  08/02/2019 Follow up : Dyspnea  Patient presents for a follow-up visit.  Patient is followed in our office for obstructive sleep apnea and is on nocturnal CPAP.  Patient was referred over from primary care for ongoing shortness of breath and abnormal chest x-ray.  Last week or 2 has had progressive shortness of breath.  Is also had weight gain. Patient has known nonischemic cardiomyopathy, and A. Fib.[name] He was admitted in December for an ablation.  Postprocedure developed cardiogenic shock and acute respiratory failure.  He required intubation and mechanical ventilatory support.  Echo showed initial EF at 10%.  Post treatment and prior to discharge patient's EF improved to 35 to 40%.  During admission patient required aggressive diuresis.  The HF clinic.  Was started on amiodarone.  He remains on Eliquis.  Patient has chronic kidney disease stage IIIb.  Baseline creatinine  1.45. Patient says recently he was seen at cardiology is on Lasix 80 mg daily.  Creatinine had increased to 1.82.  Repeat echo on February 12 showed EF stable at 35 to 40%. Patient says since decreasing Lasix 80 mg to 3 days weekly he has noticed that his weights went up 3 pounds and he is has more shortness of breath.  Patient was seen by his primary care provider and a chest x-ray was done on August 01, 2019 that showed mild interstitial thickenings with curly B-lines suspicious for mild pulmonary edema. Patient says he took an extra Lasix 80 mg yesterday.  And this morning noticed that his breathing is better.  And weight is back down to his baseline.  Patient has known severe sleep apnea.  Is on nocturnal CPAP.  Download shows excellent compliance with 100% usage and daily average usage at 8 hours.  Patient's control is decreased with AHI at 28.  Patient is on auto CPAP 8 to 12 cm H2O.  Allergies  Allergen Reactions   Sulfonamide Derivatives Other (See Comments)    unknown reaction.  Patient states had to go to the ER.    Zolpidem Other (See Comments)    Excessive, prolonged sedation    Immunization History  Administered Date(s) Administered   Influenza Split 03/03/2011, 03/10/2012, 04/07/2018   Influenza Whole 03/28/2008, 03/30/2010   Influenza,inj,Quad PF,6+ Mos 01/31/2013, 03/07/2014, 04/14/2015, 02/19/2016, 03/18/2017   Influenza-Unspecified 04/05/2019   Pneumococcal Polysaccharide-23 10/07/2009   Td 09/05/2009    Past Medical  History:  Diagnosis Date   AORTIC STENOSIS, MODERATE 12/05/2009   Atrial fibrillation (HCC)    Atrial fibrillation with RVR ([place]) 07/05/2017   Atrial flutter ([place]) 12/05/2009   Atypical chest pain 11/08/2011   BENIGN PROSTATIC HYPERTROPHY, HX OF 12/05/2009   CHEST PAIN-UNSPECIFIED 11/11/2009   CHF 12/05/2009   Chronic renal insufficiency 12/16/2016   Debility 04/18/2012   Diarrhea 12/16/2016   DYSPNEA 12/19/2009   Edema 04/18/2012    ERECTILE DYSFUNCTION 01/25/2007   FATIGUE, CHRONIC 11/11/2009   Gout 07/10/2012   Heart murmur    History of kidney stones    "they passed" (07/05/2017)   Hyperkalemia 04/18/2012   Hyperlipidemia    Hypertension    HYPERTENSION 01/25/2007   Hypoxia 05/05/2011   Insomnia 05/13/2016   INSOMNIA, HX OF 01/25/2007   Knee pain, bilateral 12/28/2010   Knee pain, right 12/28/2010   NEUROMA 01/05/2010   OBESITY NOS 01/25/2007   OSA on CPAP 04/06/2010   PERIPHERAL NEUROPATHY 01/05/2010   PULMONARY FUNCTION TESTS, ABNORMAL 02/02/2010   Superficial thrombophlebitis of left leg 09/07/2011   TESTICULAR HYPOFUNCTION 01/05/2010   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 1 Inhaler 6   allopurinol (ZYLOPRIM) 300 MG tablet Take 1 tablet by mouth once daily 90 tablet 0   amiodarone (PACERONE) 200 MG tablet Take 200 mg by mouth daily.     Blood Glucose Monitoring Suppl (ONETOUCH VERIO) w/Device KIT Use to check blood sugar TID as directed.  Dx Code: E11.9 1 kit 0   cetirizine (ZYRTEC) 10 MG tablet Take 1 tablet (10 mg total) by mouth daily. 30 tablet 11   Clobetasol Prop Emollient Base (CLOBETASOL PROPIONATE E) 0.05 % emollient cream Apply 1 application topically 2 (two) times daily as needed (insect bite). 60 g 1   colchicine (COLCRYS) 0.6 MG tablet TAKE ONE TABLET BY MOUTH DAILY AS NEEDED FOR GOUT FLARE UPS. 30 tablet 0    ELIQUIS 5 MG TABS tablet Take 1 tablet by mouth twice daily 60 tablet 10   fish oil-omega-3 fatty acids 1000 MG capsule Take 1 g by mouth daily.      furosemide (LASIX) 80 MG tablet Take 1 tablet (80 mg total) by mouth every Monday, Wednesday, and Friday. And 1 tab as needed for swelling or shortness of breath 30 tablet 6   glucose blood test strip (Verio) Use as instructed Check blood sugars TID DxE11.9 100 each 12   glyBURIDE (DIABETA) 5 MG tablet 1 tab po q am and 1/2 tab po q pm 135 tablet 1   hydrALAZINE (APRESOLINE) 25 MG tablet Take 1 tablet (25 mg total) by mouth 3 (three) times daily. 90 tablet 6   HYDROcodone-acetaminophen (NORCO) 10-325 MG tablet Take 1 tablet by mouth every 8 (eight) hours as needed for moderate pain or severe pain. 90 tablet 0   isosorbide mononitrate (IMDUR) 30 MG 24 hr  tablet Take 1 tablet (30 mg total) by mouth daily. 30 tablet 11   LORazepam (ATIVAN) 1 MG tablet Take 1 tablet (1 mg total) by mouth every 8 (eight) hours as needed for anxiety. 30 tablet 5   magnesium oxide (MAG-OX) 400 MG tablet Take 1 tablet (400 mg total) by mouth 2 (two) times daily. 30 tablet 11   metoprolol succinate (TOPROL-XL) 25 MG 24 hr tablet Take 0.5 tablets (12.5 mg total) by mouth 2 (two) times daily. 30 tablet 6   Multiple Vitamin (MULTIVITAMIN WITH MINERALS) TABS tablet Take 1 tablet by mouth daily.      naproxen (NAPROSYN) 375 MG tablet Take 375 mg by mouth daily as needed (pain).      potassium chloride SA (K-DUR,KLOR-CON) 20 MEQ tablet TAKE 1 TABLET BY MOUTH ONCE DAILY AND  EXTRA  TABLET  IF  WEIGHT  IS  ABOVE  210  LBS 180 tablet 2   pravastatin (PRAVACHOL) 40 MG tablet Take 1 tablet by mouth once daily 90 tablet 3   tamsulosin (FLOMAX) 0.4 MG CAPS capsule Take 1 capsule by mouth once daily 30 capsule 0   temazepam (RESTORIL) 30 MG capsule Take 1 capsule (30 mg total) by mouth at bedtime. 30 capsule 5   No facility-administered medications prior to visit.      Review of Systems:   Constitutional:   No  weight loss, night sweats,  Fevers, chills, + fatigue, or  lassitude.  HEENT:   No headaches,  Difficulty swallowing,  Tooth/dental problems, or  Sore throat,                No sneezing, itching, ear ache, nasal congestion, post nasal drip,   CV:  No chest pain,  Orthopnea, PND, swelling in lower extremities, anasarca, dizziness, palpitations, syncope.   GI  No heartburn, indigestion, abdominal pain, nausea, vomiting, diarrhea, change in bowel habits, loss of appetite, bloody stools.   Resp:    No chest wall deformity  Skin: no rash or lesions.  GU: no dysuria, change in color of urine, no urgency or frequency.  No flank pain, no hematuria   MS:  No joint pain or swelling.  No decreased range of motion.  No back pain.    Physical Exam  BP 114/68 (BP Location: Left Arm, Cuff Size: Normal)    Temp 98.8 F (37.1 C) (Temporal)    Ht '5\' 11"'$  (1.803 m)    Wt 191 lb (86.6 kg)    SpO2 98% Comment: RA   BMI 26.64 kg/m   GEN: A/Ox3; pleasant , elderly   HEENT:  [place]/AT,  NOSE-clear, THROAT-clear, no lesions, no postnasal drip or exudate noted.   NECK:  Supple w/ fair ROM; no JVD; normal carotid impulses w/o bruits; no thyromegaly or nodules palpated; no lymphadenopathy.    RESP  Clear  P & A; w/o, wheezes/ rales/ or rhonchi. no accessory muscle use, no dullness to percussion  CARD:  RRR, S1/6 SM  tr peripheral edema, pulses intact, no cyanosis or clubbing.  GI:   Soft & nt; nml bowel sounds; no organomegaly or masses detected.   Musco: Warm bil, no deformities or joint swelling noted.   Neuro: alert, no focal deficits noted.    Skin: Warm, no lesions or rashes    Lab Results:  CBC    Component Value Date/Time   WBC 4.7 07/23/2019 1141   RBC 4.16 (L) 07/23/2019 1141   HGB 10.7 Repeated and verified X2. (L) 07/23/2019 1141  HGB 12.9 (L) 04/30/2019 1141   HCT 34.0 (L) 07/23/2019 1141   HCT 39.2 04/30/2019 1141   PLT 230.0  07/23/2019 1141   PLT 240 04/30/2019 1141   MCV 81.7 07/23/2019 1141   MCV 81 04/30/2019 1141   MCH 25.4 (L) 07/11/2019 1118   MCHC 31.3 07/23/2019 1141   RDW 18.3 (H) 07/23/2019 1141   RDW 16.4 (H) 04/30/2019 1141   LYMPHSABS 1.7 12/16/2016 1121   MONOABS 0.7 12/16/2016 1121   EOSABS 0.1 12/16/2016 1121   BASOSABS 0.0 12/16/2016 1121    BMET    Component Value Date/Time   NA 139 07/23/2019 1141   NA 140 04/30/2019 1141   K 4.8 07/23/2019 1141   CL 104 07/23/2019 1141   CO2 27 07/23/2019 1141   GLUCOSE 162 (H) 07/23/2019 1141   BUN 42 (H) 07/23/2019 1141   BUN 42 (H) 04/30/2019 1141   CREATININE 1.82 (H) 07/23/2019 1141   CREATININE 1.39 (H) 03/24/2015 0943   CALCIUM 9.6 07/23/2019 1141   GFRNONAA 39 (L) 07/11/2019 1118   GFRAA 45 (L) 07/11/2019 1118    BNP    Component Value Date/Time   BNP 277.2 (H) 07/11/2019 1118    ProBNP    Component Value Date/Time   PROBNP 302.0 (H) 09/16/2010 0600    Imaging: DG Chest 2 View  Result Date: 08/01/2019 CLINICAL DATA:  Cough. EXAM: CHEST - 2 VIEW COMPARISON:  Most recent radiograph 05/16/2019. FINDINGS: Implanted loop recorder projects over the left chest wall. Upper normal heart size with normal mediastinal contours. Aortic atherosclerosis. Mild scarring in the right middle lobe. Minimal interstitial thickening with Kerley B-lines suspicious for mild pulmonary edema. Trace blunting of left costophrenic angle, likely small effusion. No pneumothorax. No acute osseous abnormalities. IMPRESSION: 1. Borderline cardiomegaly. Mild interstitial thickening with Kerley B-lines suspicious for mild pulmonary edema. 2. Scarring in the right middle lobe. Electronically Signed   By: Keith Rake M.D.   On: 08/01/2019 17:14   ECHOCARDIOGRAM COMPLETE  Result Date: 07/20/2019    ECHOCARDIOGRAM REPORT   Patient Name:   [name] Date of Exam: 07/20/2019 Medical Rec #:  [id number]        Height:       66.0 in Accession #:    [id number]        Weight:       188.0 lb Date of Birth:  1951-03-07         BSA:          1.95 m Patient Age:    32 years         BP:           120/75 mmHg Patient Gender: M                HR:           82 bpm. Exam Location:  Outpatient Procedure: 2D Echo Indications:    CHF 428  History:        Patient has prior history of Echocardiogram examinations, most                 recent 05/28/2019. CHF, Arrythmias:Atrial Flutter and Atrial                 Fibrillation, Signs/Symptoms:Dyspnea; Risk Factors:Diabetes,                 Hypertension, Dyslipidemia and Former Smoker.  Sonographer:    Jannett Celestine RDCS (AE) Referring Phys: [place]  BENSIMHON IMPRESSIONS  1. Left ventricular ejection fraction, by estimation, is 35 to 40%. The left ventricle has moderately decreased function. The left ventricle demonstrates global hypokinesis. There is moderately increased left ventricular hypertrophy. Left ventricular diastolic function could not be evaluated.  2. Right ventricular systolic function is low normal. The right ventricular size is mildly enlarged. There is severely elevated pulmonary artery systolic pressure.  3. Left atrial size was severely dilated.  4. The mitral valve is abnormal. Mild mitral valve regurgitation.  5. The aortic valve is tricuspid. Aortic valve regurgitation is mild to moderate. Mild aortic valve sclerosis is present, with no evidence of aortic valve stenosis.  6. Aortic dilatation noted. There is moderate dilatation of the ascending aorta measuring 43 mm.  7. The inferior vena cava is dilated in size with <50% respiratory variability, suggesting right atrial pressure of 15 mmHg. Comparison(s): No significant change from prior study. Prior images reviewed side by side. The LVEF appears to be stable around 35-40% in comparison to the prior study. The prior LVEF in 05/2019 is a little overestimated. FINDINGS  Left Ventricle: Left ventricular ejection fraction, by estimation, is 35 to 40%. The left ventricle  has moderately decreased function. The left ventricle demonstrates global hypokinesis. There is moderately increased left ventricular hypertrophy. The left ventricular diastology could not be evaluated due to atrial fibrillation. Left ventricular diastolic function could not be evaluated. Right Ventricle: The right ventricular size is mildly enlarged. No increase in right ventricular wall thickness. Right ventricular systolic function is low normal. There is severely elevated pulmonary artery systolic pressure. The tricuspid regurgitant velocity is 3.73 m/s, and with an assumed right atrial pressure of 15 mmHg, the estimated right ventricular systolic pressure is 65.4 mmHg. Left Atrium: Left atrial size was severely dilated. Right Atrium: Right atrial size was normal in size. Pericardium: There is no evidence of pericardial effusion. Mitral Valve: The mitral valve is abnormal. There is moderate thickening of the mitral valve leaflet(s). Mild mitral valve regurgitation. Tricuspid Valve: The tricuspid valve is grossly normal. Tricuspid valve regurgitation is mild. Aortic Valve: The aortic valve is tricuspid. Aortic valve regurgitation is mild to moderate. Mild aortic valve sclerosis is present, with no evidence of aortic valve stenosis. Pulmonic Valve: The pulmonic valve was grossly normal. Pulmonic valve regurgitation is mild. Aorta: Aortic dilatation noted. There is moderate dilatation of the ascending aorta measuring 43 mm. Venous: The inferior vena cava is dilated in size with less than 50% respiratory variability, suggesting right atrial pressure of 15 mmHg. IAS/Shunts: No atrial level shunt detected by color flow Doppler.  LEFT VENTRICLE PLAX 2D LVIDd:         5.30 cm LVIDs:         4.40 cm LV PW:         1.30 cm LV IVS:        1.50 cm LVOT diam:     2.20 cm LV SV:         58.54 ml LV SV Index:   23.61 LVOT Area:     3.80 cm  RIGHT VENTRICLE RV S prime:     14.50 cm/s TAPSE (M-mode): 1.7 cm LEFT ATRIUM               Index       RIGHT ATRIUM           Index LA diam:        4.90 cm  2.52 cm/m  RA Area:     19.50  cm LA Vol (A2C):   104.0 ml 53.39 ml/m RA Volume:   54.10 ml  27.77 ml/m LA Vol (A4C):   104.0 ml 53.39 ml/m LA Biplane Vol: 106.0 ml 54.42 ml/m  AORTIC VALVE LVOT Vmax:   80.90 cm/s LVOT Vmean:  65.600 cm/s LVOT VTI:    0.154 m  AORTA Ao Root diam: 3.70 cm MITRAL VALVE               TRICUSPID VALVE MV Area (PHT): 4.31 cm    TR Peak grad:   55.7 mmHg MV Decel Time: 176 msec    TR Vmax:        373.00 cm/s MV E velocity: 95.50 cm/s                            SHUNTS                            Systemic VTI:  0.15 m                            Systemic Diam: 2.20 cm Lyman Bishop MD Electronically signed by Lyman Bishop MD Signature Date/Time: 07/20/2019/11:33:40 AM    Final       No flowsheet data found.  No results found for: NITRICOXIDE      Assessment & Plan:   No problem-specific Assessment & Plan notes found for this encounter.     Rexene Edison, NP 08/02/2019

## 2019-08-02 NOTE — Patient Instructions (Addendum)
Labs today .  Take extra Lasix 40mg  on Today (08/02/19) , Saturday (2/27)  and Sunday (2/28 ) . Continue on Lasix 80mg  Monday/Wednesday /Friday.  Low salt diet. Set up for CPAP /BIPAP titration study.  Increase CPAP pressure to Auto CPAP 8 to 16 cmH2O.  Continue on CPAP At bedtime   Goal is at least 6 hrs each night.  Do not drive if sleepy.  Work on healthy weight.  Record weight log.  Refer to CHF clinic .  Follow up with Dr. Elsworth Soho  Or [place] Redditt in 4 -6 weeks and As needed   Please contact office for sooner follow up if symptoms do not improve or worsen or seek emergency care

## 2019-08-02 NOTE — Assessment & Plan Note (Signed)
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 Advised him to take extra Lasix 40 mg x 3 doses.  Check labs today with a be met and BNP. Follow back up with CHF clinic  Plan  Patient Instructions  Labs today .  Take extra Lasix '40mg'$  on Today (08/02/19) , Saturday (2/27)  and Sunday (2/28 ) . Continue on Lasix '80mg'$  Monday/Wednesday /Friday.  Low salt diet. Set up for CPAP /BIPAP titration study.  Increase CPAP pressure to Auto CPAP 8 to 16 cmH2O.  Continue on CPAP At bedtime   Goal is at least 6 hrs each night.  Do not drive if sleepy.  Work on healthy weight.  Record weight log.  Refer to CHF clinic .  Follow up with Dr. Elsworth Soho  Or Shawnte Winton in 4 -6 weeks and As needed   Please contact office for sooner follow up if symptoms do not improve or worsen or seek emergency care

## 2019-08-02 NOTE — Addendum Note (Signed)
Addended by: Parke Poisson E on: 08/02/2019 04:44 PM   Modules accepted: Orders

## 2019-08-03 NOTE — Progress Notes (Signed)
Called spoke with patient, advised of lab results / recs as stated by [facility] NP.  Pt verbalized understanding and denied any questions.  Patient is currently working his PCP on adjusting his diabetic meds d/t weight loss.

## 2019-08-09 ENCOUNTER — Telehealth: Payer: Self-pay | Admitting: *Deleted

## 2019-08-09 DIAGNOSIS — E1169 Type 2 diabetes mellitus with other specified complication: Secondary | ICD-10-CM

## 2019-08-09 DIAGNOSIS — I4819 Other persistent atrial fibrillation: Secondary | ICD-10-CM

## 2019-08-09 DIAGNOSIS — E669 Obesity, unspecified: Secondary | ICD-10-CM

## 2019-08-09 MED ORDER — AMIODARONE HCL 200 MG PO TABS: 400.0000 mg | ORAL_TABLET | Freq: Every day | ORAL | Status: DC

## 2019-08-09 NOTE — Telephone Encounter (Signed)
Tell him I agree with his PCP's recommendation to add the extra diuretic.  His weight gain and cough are likely due to fluid accumulation.  In turn, this could be due to atrial fibrillation. Even though he has gone back to atrial fibrillation, we are in a better position than we were a few months ago, when he had severe congestive heart failure.  Hopefully fixing the arrhythmia quickly will allow for more rapid recovery.  That is one of the advantages of the loop recorder. We will gladly refer to endocrinology. Dr. [name], for example.

## 2019-08-09 NOTE — Telephone Encounter (Signed)
Call placed to the patient to go over instructions from Dr. Sallyanne Kuster per a staff message. The patient has been advised to increase the Amiodarone to 400 mg daily and has agreed to the cardioversion tentatively planned for 3/9 due to his persistent afib since 2/24. He has been advised that I will call tomorrow with further instructions.  While on the phone, he had many concerns. One was his Furosemide dosage. He stated that Dr. Haroldine Laws had him on Furosemide 80mg  on M-W-F. Recently his PCP had added to take 40 mg on Tuesday, Thursday and Sunday after a chest xray showed some fluid in the lungs.   His weight today was 189 lbs, yesterday 188.8, and the day before was 188.6 lbs. He feels like his normal weight should be 182 which is what it was when he was discharged from the hospital. He is still having a dry cough and gets out of breath when ambulating distances, such as his recent trip to [place]. He is unsure if this is from the fluid or afib or both.  He is also concerned about his glucose levels and would like to see an endocrinologist to get better control of it and the medications. He would like to know if Dr. Sallyanne Kuster could put in a referral for him.  Lastly, he has some concerns over his CPAP. He stated that this issue has been causing him anxiety and lack of sleep. He was told that his settings were at 8 but should have been at 39 over the last year and he is concerned about the effect this has had on his atrial fibrillation. He is having a lot of issues with [employer] and does not want to use them anymore. He said he is having a sleep study on 3/19. He wanted to know if the office could be of any help. He has been advised that he should have the sleep study on 3/19 and that there was not much this office could do since he was not using our office for his sleep study/cpap needs. He has been advised to call his pulmonologist for help in these matters.  He does have a follow up on 3/9 with Dr.  Curt Bears.

## 2019-08-10 NOTE — Telephone Encounter (Signed)
Patient has been made aware of the instructions for the cardioversion.   Referral has been placed for Endocrinology.    You are scheduled for a  Cardioversion on 3/11 with Dr. Sallyanne Kuster.  Please arrive at the [facility] (Main Entrance A) at [facility]: [place], [place] [address] at 7 am. (1 hour prior to procedure)  DIET: Nothing to eat or drink after midnight except a sip of water with medications (see medication instructions below)  Medication Instructions: Hold Furosemide and all diabetic medications the morning of the procedure  Continue your anticoagulant: Eliquis You will need to continue your anticoagulant after your procedure until you are told by your provider that it is safe to stop   Labs:  Your provider would like for you to return on  3/9 at your appointment with Dr. Curt Bears to have the following labs drawn: BMET and CBC.  You will need to have the coronavirus test completed prior to your procedure. An appointment has been made at 1:35 on 08/13/19. This is a Drive Up Visit at the [employer] [address] Someone will direct you to the appropriate testing line. Please tell them that you are there for procedure testing. Stay in your car and someone will be with you shortly. Please make sure to have all other labs completed before this test because you will need to stay quarantined until your procedure.    You must have a responsible person to drive you home and stay in the waiting area during your procedure. Failure to do so could result in cancellation.  Bring your insurance cards.  *Special Note: Every effort is made to have your procedure done on time. Occasionally there are emergencies that occur at the hospital that may cause delays. Please be patient if a delay does occur.

## 2019-08-10 NOTE — Addendum Note (Signed)
Addended by: Parke Poisson E on: 08/10/2019 12:43 PM   Modules accepted: Orders

## 2019-08-13 ENCOUNTER — Telehealth: Payer: Self-pay | Admitting: Adult Health

## 2019-08-13 ENCOUNTER — Other Ambulatory Visit (HOSPITAL_COMMUNITY)
Admission: RE | Admit: 2019-08-13 | Discharge: 2019-08-13 | Disposition: A | Discharge status: Home / Self Care | Payer: Medicare Other | Source: Ambulatory Visit | Attending: Cardiovascular Disease | Admitting: Cardiovascular Disease

## 2019-08-13 DIAGNOSIS — Z20822 Contact with and (suspected) exposure to covid-19: Secondary | ICD-10-CM | POA: Diagnosis not present

## 2019-08-13 DIAGNOSIS — Z01812 Encounter for preprocedural laboratory examination: Secondary | ICD-10-CM | POA: Insufficient documentation

## 2019-08-13 LAB — SARS CORONAVIRUS 2 (TAT 6-24 HRS): SARS Coronavirus 2: NEGATIVE

## 2019-08-13 NOTE — Telephone Encounter (Signed)
Instructions from last OV 08/02/19    Return in about 4 weeks (around 08/30/2019) for Follow up with Dr. Elsworth Soho. Labs today .  Take extra Lasix 40mg  on Today (08/02/19) , Saturday (2/27)  and Sunday (2/28 ) . Continue on Lasix 80mg  Monday/Wednesday /Friday.  Low salt diet. Set up for CPAP /BIPAP titration study.  Increase CPAP pressure to Auto CPAP 8 to 16 cmH2O.  Continue on CPAP At bedtime   Goal is at least 6 hrs each night.  Do not drive if sleepy.  Work on healthy weight.  Record weight log.  Refer to CHF clinic .  Follow up with Dr. Elsworth Soho  Or [place] in 4 -6 weeks and As needed   Please contact office for sooner follow up if symptoms do not improve or worsen or seek emergency care      Rx was placed 08/10/19 for pt to have cpap pressure settings changed to auto 8-16cm instead of the original set pressure of 10cm.  Called and spoke with pt letting him know that prescription was placed 3/5 for his settings to be changed. Pt stated he received a call 3/1 that his settings were set at 10cm. I stated to pt again that it was 3/5 that the prescription was placed for his settings to be changed to the auto range. Stated to pt that message was received by [employer] that they received the setting change and he could contact them but pt did not like when I stated that and wanted [place] to reverify this with [employer].  Judeen Hammans, please advise. Thanks!

## 2019-08-14 ENCOUNTER — Ambulatory Visit (HOSPITAL_COMMUNITY)
Admission: RE | Admit: 2019-08-14 | Discharge: 2019-08-14 | Disposition: A | Discharge status: Home / Self Care | Payer: Medicare Other | Source: Ambulatory Visit | Attending: Adult Health | Admitting: Adult Health

## 2019-08-14 ENCOUNTER — Encounter (HOSPITAL_COMMUNITY): Payer: Self-pay

## 2019-08-14 ENCOUNTER — Other Ambulatory Visit: Payer: Self-pay

## 2019-08-14 ENCOUNTER — Encounter: Payer: Self-pay | Admitting: Cardiology

## 2019-08-14 ENCOUNTER — Ambulatory Visit (INDEPENDENT_AMBULATORY_CARE_PROVIDER_SITE_OTHER): Payer: Medicare Other | Admitting: Cardiology

## 2019-08-14 VITALS — BP 146/82 | HR 110 | Wt 197.8 lb

## 2019-08-14 VITALS — BP 130/74 | HR 119 | Ht 71.0 in | Wt 198.0 lb

## 2019-08-14 DIAGNOSIS — I5022 Chronic systolic (congestive) heart failure: Secondary | ICD-10-CM | POA: Insufficient documentation

## 2019-08-14 DIAGNOSIS — E1122 Type 2 diabetes mellitus with diabetic chronic kidney disease: Secondary | ICD-10-CM | POA: Diagnosis not present

## 2019-08-14 DIAGNOSIS — Z7901 Long term (current) use of anticoagulants: Secondary | ICD-10-CM | POA: Diagnosis not present

## 2019-08-14 DIAGNOSIS — Z833 Family history of diabetes mellitus: Secondary | ICD-10-CM | POA: Insufficient documentation

## 2019-08-14 DIAGNOSIS — I130 Hypertensive heart and chronic kidney disease with heart failure and stage 1 through stage 4 chronic kidney disease, or unspecified chronic kidney disease: Secondary | ICD-10-CM | POA: Insufficient documentation

## 2019-08-14 DIAGNOSIS — I4819 Other persistent atrial fibrillation: Secondary | ICD-10-CM | POA: Diagnosis not present

## 2019-08-14 DIAGNOSIS — Z791 Long term (current) use of non-steroidal anti-inflammatories (NSAID): Secondary | ICD-10-CM | POA: Insufficient documentation

## 2019-08-14 DIAGNOSIS — N1832 Chronic kidney disease, stage 3b: Secondary | ICD-10-CM | POA: Insufficient documentation

## 2019-08-14 DIAGNOSIS — N400 Enlarged prostate without lower urinary tract symptoms: Secondary | ICD-10-CM | POA: Insufficient documentation

## 2019-08-14 DIAGNOSIS — E669 Obesity, unspecified: Secondary | ICD-10-CM | POA: Insufficient documentation

## 2019-08-14 DIAGNOSIS — Z87891 Personal history of nicotine dependence: Secondary | ICD-10-CM | POA: Insufficient documentation

## 2019-08-14 DIAGNOSIS — G4700 Insomnia, unspecified: Secondary | ICD-10-CM | POA: Insufficient documentation

## 2019-08-14 DIAGNOSIS — Z79899 Other long term (current) drug therapy: Secondary | ICD-10-CM | POA: Insufficient documentation

## 2019-08-14 DIAGNOSIS — I5042 Chronic combined systolic (congestive) and diastolic (congestive) heart failure: Secondary | ICD-10-CM

## 2019-08-14 DIAGNOSIS — Z7984 Long term (current) use of oral hypoglycemic drugs: Secondary | ICD-10-CM | POA: Diagnosis not present

## 2019-08-14 DIAGNOSIS — E785 Hyperlipidemia, unspecified: Secondary | ICD-10-CM | POA: Insufficient documentation

## 2019-08-14 DIAGNOSIS — G4733 Obstructive sleep apnea (adult) (pediatric): Secondary | ICD-10-CM | POA: Insufficient documentation

## 2019-08-14 DIAGNOSIS — Z8249 Family history of ischemic heart disease and other diseases of the circulatory system: Secondary | ICD-10-CM | POA: Diagnosis not present

## 2019-08-14 LAB — CBC
Hematocrit: 34.5 % — ABNORMAL LOW (ref 37.5–51.0)
Hemoglobin: 10.8 g/dL — ABNORMAL LOW (ref 13.0–17.7)
MCH: 26.2 pg — ABNORMAL LOW (ref 26.6–33.0)
MCHC: 31.3 g/dL — ABNORMAL LOW (ref 31.5–35.7)
MCV: 84 fL (ref 79–97)
Platelets: 212 10*3/uL (ref 150–450)
RBC: 4.12 x10E6/uL — ABNORMAL LOW (ref 4.14–5.80)
RDW: 16 % — ABNORMAL HIGH (ref 11.6–15.4)
WBC: 4.3 10*3/uL (ref 3.4–10.8)

## 2019-08-14 LAB — BASIC METABOLIC PANEL
BUN/Creatinine Ratio: 19 (ref 10–24)
BUN: 39 mg/dL — ABNORMAL HIGH (ref 8–27)
CO2: 23 mmol/L (ref 20–29)
Calcium: 9.7 mg/dL (ref 8.6–10.2)
Chloride: 103 mmol/L (ref 96–106)
Creatinine, Ser: 2.01 mg/dL — ABNORMAL HIGH (ref 0.76–1.27)
GFR calc Af Amer: 39 mL/min/{1.73_m2} — ABNORMAL LOW (ref >59)
GFR calc non Af Amer: 34 mL/min/{1.73_m2} — ABNORMAL LOW (ref >59)
Glucose: 159 mg/dL — ABNORMAL HIGH (ref 65–99)
Potassium: 5 mmol/L (ref 3.5–5.2)
Sodium: 141 mmol/L (ref 134–144)

## 2019-08-14 NOTE — Progress Notes (Signed)
Advanced Heart Failure Clinic Note   Referring Physician: PCP: [name], MD PCP-Cardiologist: Sanda Klein, MD  [facility]: Dr. 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  Admitted 05/16/19 for PVI with successful procedure. Post procedure he developed cardiogenic shock and respiratory failure. CCM consulted for intubation. Advanced HF team consulted. As he improved he was extubated.  Treated with pressors and gradually weaned off. HF meds initiated but unable to treat w/ ARB/ARNI nor spiro due to AKI, SCr peaking to 2.2. Tolerated Bidil and digoxin. ECHO initially showed EF 10% but improved 35-40% prior to discharge. Was discharged home on 05/29/19.   He went back in A fib 08/02/19. Saw pulmonary on 08/02/19 and was noted to have many central events. CPAP was to be changed to auto titration.    Today he returns for HF follow up. Saw Dr Curt Bears earlier today and has plans for cardioversion on 08/16/19. Overall feeling fair. SOB with exertion. Denies PND/Orthopnea. Appetite ok. Snacks on crackers. Drinks lots of water. No fever or chills. Weight at home 185-190  pounds. Uses CPAP. Taking all medications.  Past Medical History:  Diagnosis Date  . AORTIC STENOSIS, MODERATE 12/05/2009  . Atrial fibrillation ([place])   . Atrial fibrillation with RVR ([place]) 07/05/2017  . Atrial flutter ([place]) 12/05/2009  . Atypical chest pain 11/08/2011  . BENIGN PROSTATIC HYPERTROPHY, HX OF 12/05/2009  . CHEST PAIN-UNSPECIFIED 11/11/2009  . CHF 12/05/2009  . Chronic renal insufficiency 12/16/2016  . Debility 04/18/2012  . Diarrhea 12/16/2016  . DYSPNEA 12/19/2009  . Edema 04/18/2012  . ERECTILE DYSFUNCTION 01/25/2007  . FATIGUE, CHRONIC 11/11/2009  . Gout 07/10/2012  . Heart murmur   . History of kidney stones    "they passed" (07/05/2017)  . Hyperkalemia 04/18/2012  . Hyperlipidemia   . Hypertension   . HYPERTENSION 01/25/2007  .  Hypoxia 05/05/2011  . Insomnia 05/13/2016  . INSOMNIA, HX OF 01/25/2007  . Knee pain, bilateral 12/28/2010  . Knee pain, right 12/28/2010  . NEUROMA 01/05/2010  . OBESITY NOS 01/25/2007  . OSA on CPAP 04/06/2010  . PERIPHERAL NEUROPATHY 01/05/2010  . PULMONARY FUNCTION TESTS, ABNORMAL 02/02/2010  . Superficial thrombophlebitis of left leg 09/07/2011  . TESTICULAR HYPOFUNCTION 01/05/2010  . 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 Toe pain, right 07/10/2012  . Type II diabetes mellitus ([place]) 12/05/2009  . UNSPECIFIED ANEMIA 01/05/2010    Current Outpatient Medications  Medication Sig Dispense Refill  . albuterol (VENTOLIN HFA) 108 (90 Base) MCG/ACT inhaler Inhale 2 puffs into the lungs every 6 (six) hours as needed for wheezing or shortness of breath. 1 Inhaler 6  . allopurinol (ZYLOPRIM) 300 MG tablet Take 1 tablet by mouth once daily 90 tablet 0  . amiodarone (PACERONE) 200 MG tablet Take 2 tablets (400 mg total) by mouth daily.    . Blood Glucose Monitoring Suppl (ONETOUCH VERIO) w/Device KIT Use to check blood sugar TID as directed.  Dx Code: E11.9 1 kit 0  . cetirizine (ZYRTEC) 10 MG tablet Take 1 tablet (10 mg total) by mouth daily. 30 tablet 11  . Clobetasol Prop Emollient Base (CLOBETASOL PROPIONATE E) 0.05 % emollient cream Apply 1 application topically 2 (two) times daily as needed (insect bite). 60 g 1  . colchicine (COLCRYS) 0.6 MG tablet TAKE ONE TABLET BY MOUTH DAILY AS NEEDED FOR GOUT FLARE UPS. 30 tablet 0  . ELIQUIS 5 MG  TABS tablet Take 1 tablet by mouth twice daily 60 tablet 10  . fish oil-omega-3 fatty acids 1000 MG capsule Take 1 g by mouth daily.     . furosemide (LASIX) 80 MG tablet Take 1 tablet (80 mg total) by mouth every Monday, Wednesday, and Friday. And 1 tab as needed for swelling or shortness of breath 30 tablet 6  . glucose blood test strip (Verio) Use as instructed Check blood sugars TID DxE11.9 100 each 12  . glyBURIDE (DIABETA) 5 MG tablet 1 tab po q am and 1/2 tab po q pm 135 tablet  1  . hydrALAZINE (APRESOLINE) 25 MG tablet Take 1 tablet (25 mg total) by mouth 3 (three) times daily. 90 tablet 6  . HYDROcodone-acetaminophen (NORCO) 10-325 MG tablet Take 1 tablet by mouth every 8 (eight) hours as needed for moderate pain or severe pain. 90 tablet 0  . isosorbide mononitrate (IMDUR) 30 MG 24 hr tablet Take 1 tablet (30 mg total) by mouth daily. 30 tablet 11  . LORazepam (ATIVAN) 1 MG tablet Take 1 tablet (1 mg total) by mouth every 8 (eight) hours as needed for anxiety. 30 tablet 5  . magnesium oxide (MAG-OX) 400 MG tablet Take 1 tablet (400 mg total) by mouth 2 (two) times daily. 30 tablet 11  . metoprolol succinate (TOPROL-XL) 25 MG 24 hr tablet Take 0.5 tablets (12.5 mg total) by mouth 2 (two) times daily. 30 tablet 6  . Multiple Vitamin (MULTIVITAMIN WITH MINERALS) TABS tablet Take 1 tablet by mouth daily.     . naproxen (NAPROSYN) 375 MG tablet Take 375 mg by mouth 2 (two) times daily as needed (knee pain.).     [name] potassium chloride SA (K-DUR,KLOR-CON) 20 MEQ tablet TAKE 1 TABLET BY MOUTH ONCE DAILY AND  EXTRA  TABLET  IF  WEIGHT  IS  ABOVE  210  LBS 180 tablet 2  . pravastatin (PRAVACHOL) 40 MG tablet Take 1 tablet by mouth once daily 90 tablet 3  . tamsulosin (FLOMAX) 0.4 MG CAPS capsule Take 1 capsule by mouth once daily 30 capsule 0  . temazepam (RESTORIL) 30 MG capsule Take 1 capsule (30 mg total) by mouth at bedtime. 30 capsule 5   No current facility-administered medications for this encounter.    Allergies  Allergen Reactions  . Sulfonamide Derivatives Other (See Comments)    unknown reaction.  Patient states had to go to the ER.   [name] Zolpidem Other (See Comments)    Excessive, prolonged sedation      Social History   Socioeconomic History  . Marital status: Married    Spouse name: Not on file  . Number of children: 4  . Years of education: 57  . Highest education level: High school graduate  Occupational History  . Occupation: retired    Fish farm manager:  [employer]  Tobacco Use  . Smoking status: Former Smoker    Packs/day: 1.00    Years: 10.00    Pack years: 10.00    Quit date: 06/07/1980    Years since quitting: 39.2  . Smokeless tobacco: Never Used  Substance and Sexual Activity  . Alcohol use: No  . Drug use: No  . Sexual activity: Not Currently  Other Topics Concern  . Not on file  Social History Narrative   Lives with male partner in a one story home.  His son lives there off and on.  Retired from [employer].  Education: high school.  Right handed  Social Determinants of Health   Financial Resource Strain:   . Difficulty of Paying Living Expenses: Not on file  Food Insecurity:   . Worried About Charity fundraiser in the Last Year: Not on file  . Ran Out of Food in the Last Year: Not on file  Transportation Needs:   . Lack of Transportation (Medical): Not on file  . Lack of Transportation (Non-Medical): Not on file  Physical Activity:   . Days of Exercise per Week: Not on file  . Minutes of Exercise per Session: Not on file  Stress:   . Feeling of Stress : Not on file  Social Connections:   . Frequency of Communication with Friends and Family: Not on file  . Frequency of Social Gatherings with Friends and Family: Not on file  . Attends Religious Services: Not on file  . Active Member of Clubs or Organizations: Not on file  . Attends Archivist Meetings: Not on file  . Marital Status: Not on file  Intimate Partner Violence:   . Fear of Current or Ex-Partner: Not on file  . Emotionally Abused: Not on file  . Physically Abused: Not on file  . Sexually Abused: Not on file      Family History  Problem Relation Age of Onset  . Clotting disorder Mother   . Heart disease Mother        s/p MI  . Heart attack Mother   . Hypertension Mother   . Diabetes Mother   . Hyperlipidemia Mother   . Stroke Mother   . Cancer Father        ? lung  . Lung disease Father        smoker  . Diabetes Sister   .  Hypertension Sister        smoker  . Leukemia Maternal Grandmother        ?  [name] Aneurysm Sister        brain  . Other Sister        clipped  . Seizures Sister        d/o w/aneurysm/ smoker    Vitals:   08/14/19 1345  BP: (!) 146/82  Pulse: (!) 110  SpO2: 96%  Weight: 89.7 kg (197 lb 12.8 oz)    Wt Readings from Last 3 Encounters:  08/14/19 89.7 kg (197 lb 12.8 oz)  08/14/19 89.8 kg (198 lb)  08/02/19 86.6 kg (191 lb)   Reds Clip 58 %  PHYSICAL EXAM: General:  Well appearing. No resp difficulty HEENT: normal Neck: supple. JVP to jaw. Carotids 2+ bilat; no bruits. No lymphadenopathy or thryomegaly appreciated. Cor: PMI nondisplaced. Irregular rate & rhythm. No rubs, gallops or murmurs. Lungs: clear Abdomen: soft, nontender, distended. No hepatosplenomegaly. No bruits or masses. Good bowel sounds. Extremities: no cyanosis, clubbing, rash, R and LLE trace edema Neuro: alert & orientedx3, cranial nerves grossly intact. moves all 4 extremities w/o difficulty. Affect pleasant    ASSESSMENT & PLAN:  1. 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  EF 10% by TEE 12/9 likely due to tachy CM Initially on pressors after PVI.  - Limited ECHO completed on 05/28/19 and showed EF improving with SR, 40-45%.  - ECHO 07/20/19: EF 35-40%  -NYHA III. Volume status elevated suspect A fib has playing major role.  - Instructed to take  80 mg lasix  today, tomorrow he will take 80 mg twice, then he will go back to lasix 80 mg M-W-F.  - Continue Toprol 12.5 bid  - Continue Hydralazine + Imdur  - With advanced renal disease will not titrate further given soft BP   2. Persistent AF - s/p successful PVI 12/9. Has LINQ - Tikosyn off due to AKI - Continue amiodarone 400 mg daily.  - Back in A fib today with poor tolerance.  Plan for cardioversion tomorrow by Dr Curt Bears -  Continue Eliquis.  - Continue Toprol XL   3. CKD 3b Most recent creatinine was 1.9  - BMET today at EP office.   4. OSA Per Pulmonary. CPAP changed to auto titration 08/11/19.  Follow up  2 weeks to reassess volume status.    Darrick Grinder, NP 08/14/19

## 2019-08-14 NOTE — Telephone Encounter (Signed)
Order was placed on 3/5 and sent to [employer].  I received confirmation back from [place] yesterday that order was received.  I called [employer] & spoke to [place].  She verified they had order.  I told her I had sent it as urgent.  She stated she would speak to Respiratory today about order.  I called pt to make him aware order was received.  Nothing further needed on this.

## 2019-08-14 NOTE — Telephone Encounter (Signed)
I am working on this

## 2019-08-14 NOTE — H&P (View-Only) (Signed)
 Electrophysiology Office Note   Date:  08/14/2019   ID:  [name], DOB 05/19/1951, MRN [id number]  PCP:  Blyth, Stacey A, MD  Cardiologist:  [facility] Primary Electrophysiologist:  Kamauri Kathol Martin Alexanderia Gorby, MD    No chief complaint on file.    History of Present Illness: [name] is a 68 y.o. male who is being seen today for the evaluation of atrial fibrillation at the request of Mihai Croitrou. Presenting today for electrophysiology evaluation.  He has a history of atrial fibrillation, atrial flutter, aortic insufficiency, hypertension, OSA, hyperlipidemia, type 2 diabetes, and diastolic heart failure.   He had cardioversion 02/27/2019.  Based on his symptoms, he was in sinus rhythm for 24 to 48 hours.  He has developed NYHA class II-III exertional dyspnea and fatigue.  He does not have chest pain.  He does report compliance with CPAP.  ECG previously showed atrial flutter with a heart rate of 113.   He had AF ablation 05/16/2019.  He was in acute systolic heart failure at the time of ablation and required prolonged hospitalization postop.  He had done well, but did go back into atrial fibrillation.  Today, denies symptoms of palpitations, chest pain, orthopnea, PND, lower extremity edema, claudication, dizziness, presyncope, syncope, bleeding, or neurologic sequela. The patient is tolerating medications without difficulties.  He was walking through [employer] a few weeks ago.  He walked in feeling well, but noted that he was having more shortness of breath walking to the [place].  He had his Linq monitor interrogated which showed recurrent atrial fibrillation.  He is scheduled for cardioversion on Thursday.  He also complains of some mild cough.  His weights have gone up since his discharge from the hospital approximately 6 to 7 pounds.  Past Medical History:  Diagnosis Date  . AORTIC STENOSIS, MODERATE 12/05/2009  . Atrial fibrillation (HCC)   . Atrial fibrillation with RVR (HCC)  07/05/2017  . Atrial flutter (HCC) 12/05/2009  . Atypical chest pain 11/08/2011  . BENIGN PROSTATIC HYPERTROPHY, HX OF 12/05/2009  . CHEST PAIN-UNSPECIFIED 11/11/2009  . CHF 12/05/2009  . Chronic renal insufficiency 12/16/2016  . Debility 04/18/2012  . Diarrhea 12/16/2016  . DYSPNEA 12/19/2009  . Edema 04/18/2012  . ERECTILE DYSFUNCTION 01/25/2007  . FATIGUE, CHRONIC 11/11/2009  . Gout 07/10/2012  . Heart murmur   . History of kidney stones    "they passed" (07/05/2017)  . Hyperkalemia 04/18/2012  . Hyperlipidemia   . Hypertension   . HYPERTENSION 01/25/2007  . Hypoxia 05/05/2011  . Insomnia 05/13/2016  . INSOMNIA, HX OF 01/25/2007  . Knee pain, bilateral 12/28/2010  . Knee pain, right 12/28/2010  . NEUROMA 01/05/2010  . OBESITY NOS 01/25/2007  . OSA on CPAP 04/06/2010  . PERIPHERAL NEUROPATHY 01/05/2010  . PULMONARY FUNCTION TESTS, ABNORMAL 02/02/2010  . Superficial thrombophlebitis of left leg 09/07/2011  . TESTICULAR HYPOFUNCTION 01/05/2010  . 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 Toe pain, right 07/10/2012  . Type II diabetes mellitus (HCC) 12/05/2009  . UNSPECIFIED ANEMIA 01/05/2010   Past Surgical History:  Procedure Laterality Date  . A-FLUTTER ABLATION N/A 05/16/2019   Procedure: A-FLUTTER ABLATION;  Surgeon: Zerina Hallinan Martin, MD;  Location: [facility];  Service: Cardiovascular;  Laterality: N/A;  . ATRIAL FIBRILLATION ABLATION N/A 05/16/2019   Procedure: ATRIAL FIBRILLATION ABLATION;  Surgeon: Thurman Sarver Martin, MD;  Location: [facility];  Service: Cardiovascular;  Laterality: N/A;  . CARDIAC CATHETERIZATION  10/16/2009   nonischemic cardiomyopathy  .   CARDIOVERSION N/A 03/25/2015   Procedure: CARDIOVERSION;  Surgeon: Kenneth C Hilty, MD;  Location: [facility];  Service: Cardiovascular;  Laterality: N/A;  . CARDIOVERSION N/A 06/17/2017   Procedure: CARDIOVERSION;  Surgeon: Croitoru, Mihai, MD;  Location: [facility];  Service: Cardiovascular;  Laterality: N/A;  . CARDIOVERSION N/A  07/08/2017   Procedure: CARDIOVERSION;  Surgeon: Ross, Paula V, MD;  Location: [facility];  Service: Cardiovascular;  Laterality: N/A;  . CARDIOVERSION N/A 02/27/2019   Procedure: CARDIOVERSION;  Surgeon: Croitoru, Mihai, MD;  Location: [facility];  Service: Cardiovascular;  Laterality: N/A;  . METATARSAL OSTEOTOMY Bilateral ~ 1980   removed part of 5th metatarsal to corect curvature of toes   . RIGHT HEART CATH N/A 05/16/2019   Procedure: RIGHT HEART CATH;  Surgeon: Bensimhon, Daniel R, MD;  Location: [facility];  Service: Cardiovascular;  Laterality: N/A;  . TEE WITHOUT CARDIOVERSION  05/16/2019   Procedure: Transesophageal Echocardiogram (Tee);  Surgeon: Yago Ludvigsen Martin, MD;  Location: [facility];  Service: Cardiovascular;;     Current Outpatient Medications  Medication Sig Dispense Refill  . albuterol (VENTOLIN HFA) 108 (90 Base) MCG/ACT inhaler Inhale 2 puffs into the lungs every 6 (six) hours as needed for wheezing or shortness of breath. 1 Inhaler 6  . allopurinol (ZYLOPRIM) 300 MG tablet Take 1 tablet by mouth once daily 90 tablet 0  . amiodarone (PACERONE) 200 MG tablet Take 2 tablets (400 mg total) by mouth daily.    . Blood Glucose Monitoring Suppl (ONETOUCH VERIO) w/Device KIT Use to check blood sugar TID as directed.  Dx Code: E11.9 1 kit 0  . cetirizine (ZYRTEC) 10 MG tablet Take 1 tablet (10 mg total) by mouth daily. 30 tablet 11  . Clobetasol Prop Emollient Base (CLOBETASOL PROPIONATE E) 0.05 % emollient cream Apply 1 application topically 2 (two) times daily as needed (insect bite). 60 g 1  . colchicine (COLCRYS) 0.6 MG tablet TAKE ONE TABLET BY MOUTH DAILY AS NEEDED FOR GOUT FLARE UPS. 30 tablet 0  . ELIQUIS 5 MG TABS tablet Take 1 tablet by mouth twice daily 60 tablet 10  . fish oil-omega-3 fatty acids 1000 MG capsule Take 1 g by mouth daily.     . furosemide (LASIX) 80 MG tablet Take 1 tablet (80 mg total) by mouth every Monday, Wednesday, and Friday.  And 1 tab as needed for swelling or shortness of breath 30 tablet 6  . glucose blood test strip (Verio) Use as instructed Check blood sugars TID DxE11.9 100 each 12  . glyBURIDE (DIABETA) 5 MG tablet 1 tab po q am and 1/2 tab po q pm 135 tablet 1  . hydrALAZINE (APRESOLINE) 25 MG tablet Take 1 tablet (25 mg total) by mouth 3 (three) times daily. 90 tablet 6  . HYDROcodone-acetaminophen (NORCO) 10-325 MG tablet Take 1 tablet by mouth every 8 (eight) hours as needed for moderate pain or severe pain. 90 tablet 0  . isosorbide mononitrate (IMDUR) 30 MG 24 hr tablet Take 1 tablet (30 mg total) by mouth daily. 30 tablet 11  . LORazepam (ATIVAN) 1 MG tablet Take 1 tablet (1 mg total) by mouth every 8 (eight) hours as needed for anxiety. 30 tablet 5  . magnesium oxide (MAG-OX) 400 MG tablet Take 1 tablet (400 mg total) by mouth 2 (two) times daily. 30 tablet 11  . metoprolol succinate (TOPROL-XL) 25 MG 24 hr tablet Take 0.5 tablets (12.5 mg total) by mouth 2 (two) times daily. 30   tablet 6  . Multiple Vitamin (MULTIVITAMIN WITH MINERALS) TABS tablet Take 1 tablet by mouth daily.     . naproxen (NAPROSYN) 375 MG tablet Take 375 mg by mouth 2 (two) times daily as needed (knee pain.).     . potassium chloride SA (K-DUR,KLOR-CON) 20 MEQ tablet TAKE 1 TABLET BY MOUTH ONCE DAILY AND  EXTRA  TABLET  IF  WEIGHT  IS  ABOVE  210  LBS 180 tablet 2  . pravastatin (PRAVACHOL) 40 MG tablet Take 1 tablet by mouth once daily 90 tablet 3  . tamsulosin (FLOMAX) 0.4 MG CAPS capsule Take 1 capsule by mouth once daily 30 capsule 0  . temazepam (RESTORIL) 30 MG capsule Take 1 capsule (30 mg total) by mouth at bedtime. 30 capsule 5   No current facility-administered medications for this visit.    Allergies:   Sulfonamide derivatives and Zolpidem   Social History:  The patient  reports that he quit smoking about 39 years ago. He has a 10.00 pack-year smoking history. He has never used smokeless tobacco. He reports that he does  not drink alcohol or use drugs.   Family History:  The patient's family history includes Aneurysm in his sister; Cancer in his father; Clotting disorder in his mother; Diabetes in his mother and sister; Heart attack in his mother; Heart disease in his mother; Hyperlipidemia in his mother; Hypertension in his mother and sister; Leukemia in his maternal grandmother; Lung disease in his father; Other in his sister; Seizures in his sister; Stroke in his mother.    ROS:  Please see the history of present illness.   Otherwise, review of systems is positive for none.   All other systems are reviewed and negative.   PHYSICAL EXAM: VS:  BP 130/74   Pulse (!) 119   Ht 5' 11" (1.803 m)   Wt 198 lb (89.8 kg)   SpO2 96%   BMI 27.62 kg/m  , BMI Body mass index is 27.62 kg/m. GEN: Well nourished, well developed, in no acute distress  HEENT: normal  Neck: no JVD, carotid bruits, or masses Cardiac: Irregular, tachycardic, 2 out of 6 murmur at the base; no rubs, or gallops,no edema  Respiratory:  clear to auscultation bilaterally, normal work of breathing GI: soft, nontender, nondistended, + BS MS: no deformity or atrophy  Skin: warm and dry Neuro:  Strength and sensation are intact Psych: euthymic mood, full affect  EKG:  EKG is ordered today. Personal review of the ekg ordered shows atrial fibrillation, rate 110  Recent Labs: 05/29/2019: Magnesium 2.1 06/11/2019: TSH 1.91 07/11/2019: B Natriuretic Peptide 277.2 07/23/2019: ALT 23; Hemoglobin 10.7 Repeated and verified X2.; Platelets 230.0 08/02/2019: BUN 39; Creatinine, Ser 1.90; Potassium 4.1; Pro B Natriuretic peptide (BNP) 446.0; Sodium 140    Lipid Panel     Component Value Date/Time   CHOL 134 06/11/2019 1106   TRIG 83.0 06/11/2019 1106   HDL 41.50 06/11/2019 1106   CHOLHDL 3 06/11/2019 1106   VLDL 16.6 06/11/2019 1106   LDLCALC 76 06/11/2019 1106   LDLDIRECT 84.0 05/02/2018 1523     Wt Readings from Last 3 Encounters:  08/14/19  198 lb (89.8 kg)  08/02/19 191 lb (86.6 kg)  07/23/19 193 lb 6.4 oz (87.7 kg)      Other studies Reviewed: Additional studies/ records that were reviewed today include: TTE 06/16/17  Review of the above records today demonstrates:  - Left ventricle: The cavity size was normal. There was moderate     concentric hypertrophy. Systolic function was moderately to   severely reduced. The estimated ejection fraction was in the   range of 30% to 35%. Severe diffuse hypokinesis with distinct   regional wall motion abnormalities. There is akinesis of the   apical myocardium. The study is not technically sufficient to   allow evaluation of LV diastolic function. - Aortic valve: There was mild to moderate regurgitation directed   eccentrically in the LVOT and towards the mitral anterior   leaflet. - Aorta: Aortic root dimension: 40 mm (ED). Ascending aortic   diameter: 39 mm (S). - Aortic root: The aortic root was mildly dilated. - Ascending aorta: The ascending aorta was mildly dilated. - Mitral valve: Calcified annulus. Mildly thickened leaflets .   There was moderate regurgitation directed eccentrically and   posteriorly. - Left atrium: The atrium was mildly dilated. - Right ventricle: The cavity size was mildly dilated. Wall   thickness was normal. - Right atrium: The atrium was mildly dilated. - Pulmonary arteries: PA peak pressure: 43 mm Hg (S).  TTE 09/14/17 - Limited study for LVEF. Compared to a prior study in 06/2017, the   LVEF has improved to 42%.  ASSESSMENT AND PLAN:  1.  Paroxysmal atrial fibrillation/flutter: Currently on amiodarone and Eliquis.  He is status post atrial fibrillation ablation 05/16/2019.  Unfortunately he has gone back into atrial fibrillation.  He had a prior ablation, but was in acute systolic heart failure and thus ablation needed to be cut short.  He may require repeat ablation.  CHA2DS2-VASc of at least 3.   2.  Chronic systolic and diastolic heart  failure: Currently on optimal medical therapy with Toprol-XL, Imdur, and hydralazine.  Most recent echo shows an improved ejection fraction of 35 to 40%.  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  Unfortunately his weight has increased.  We Bennie Scaff check a basic metabolic to see what his creatinine is and potentially increase his Lasix for a few days based on that.  This Jahkai Yandell likely also improve after cardioversion.  3.  Hypertension: Currently well controlled  Current medicines are reviewed at length with the patient today.   The patient does not have concerns regarding his medicines.  The following changes were made today: none  Labs/ tests ordered today include:  Orders Placed This Encounter  Procedures  . EKG 12-Lead     Disposition:   FU with Bea Duren 3 months  Signed, Mivaan Corbitt Martin Naviyah Schaffert, MD  08/14/2019 11:38 AM     [facility] [address] [place] [address] [phone] (office) [phone] (fax) 

## 2019-08-14 NOTE — Patient Instructions (Addendum)
Medication Instructions:  Your physician recommends that you continue on your current medications as directed. Please refer to the Current Medication list given to you today.  *If you need a refill on your cardiac medications before your next appointment, please call your pharmacy*   Lab Work: Today: BMET & CBC If you have labs (blood work) drawn today and your tests are completely normal, you will receive your results only by: [name] MyChart Message (if you have MyChart) OR . A paper copy in the mail If you have any lab test that is abnormal or we need to change your treatment, we will call you to review the results.   Testing/Procedures: None ordered   Follow-Up: At [facility], you and your health needs are our priority.  As part of our continuing mission to provide you with exceptional heart care, we have created designated Provider Care Teams.  These Care Teams include your primary Cardiologist (physician) and Advanced Practice Providers (APPs -  Physician Assistants and Nurse Practitioners) who all work together to provide you with the care you need, when you need it.  We recommend signing up for the patient portal called "MyChart".  Sign up information is provided on this After Visit Summary.  MyChart is used to connect with patients for Virtual Visits (Telemedicine).  Patients are able to view lab/test results, encounter notes, upcoming appointments, etc.  Non-urgent messages can be sent to your provider as well.   To learn more about what you can do with MyChart, go to [email or web address].    Your next appointment:   3 month(s)  The format for your next appointment:   In Person  Provider:   Allegra Lai, MD (the office will call you to arrange this appointment)  Thank you for choosing [facility]!!   Trinidad Curet, RN [phone]

## 2019-08-14 NOTE — Patient Instructions (Signed)
PLEASE Take 80mg  of Furosemide today  THEN take 80mg  of Furosemide TWO TIMES tomorrow.  Please follow up with the [facility] in 2 weeks.  At the [facility], you and your health needs are our priority. As part of our continuing mission to provide you with exceptional heart care, we have created designated Provider Care Teams. These Care Teams include your primary Cardiologist (physician) and Advanced Practice Providers (APPs- Physician Assistants and Nurse Practitioners) who all work together to provide you with the care you need, when you need it.   You may see any of the following providers on your designated Care Team at your next follow up: [name] Dr [name] . Dr Loralie Champagne . Darrick Grinder, NP . Lyda Jester, PA . Audry Riles, PharmD   Please be sure to bring in all your medications bottles to every appointment.

## 2019-08-14 NOTE — Progress Notes (Signed)
Electrophysiology Office Note   Date:  08/14/2019   ID:  Wyn, Nettle 03/10/1951, MRN [id number]  PCP:  [name], MD  Cardiologist:  [facility] Primary Electrophysiologist:  Will Meredith [name], MD    No chief complaint on file.    History of Present Illness: [name] is a 69 y.o. male who is being seen today for the evaluation of atrial fibrillation at the request of Mihai Croitrou. Presenting today for electrophysiology evaluation.  He has a history of atrial fibrillation, atrial flutter, aortic insufficiency, hypertension, OSA, hyperlipidemia, type 2 diabetes, and diastolic heart failure.   He had cardioversion 02/27/2019.  Based on his symptoms, he was in sinus rhythm for 24 to 48 hours.  He has developed NYHA class II-III exertional dyspnea and fatigue.  He does not have chest pain.  He does report compliance with CPAP.  ECG previously showed atrial flutter with a heart rate of 113.   He had AF ablation 05/16/2019.  He was in acute systolic heart failure at the time of ablation and required prolonged hospitalization postop.  He had done well, but did go back into atrial fibrillation.  Today, denies symptoms of palpitations, chest pain, orthopnea, PND, lower extremity edema, claudication, dizziness, presyncope, syncope, bleeding, or neurologic sequela. The patient is tolerating medications without difficulties.  He was walking through [place] a few weeks ago.  He walked in feeling well, but noted that he was having more shortness of breath walking to the [place].  He had his Linq monitor interrogated which showed recurrent atrial fibrillation.  He is scheduled for cardioversion on Thursday.  He also complains of some mild cough.  His weights have gone up since his discharge from the hospital approximately 6 to 7 pounds.  Past Medical History:  Diagnosis Date  . AORTIC STENOSIS, MODERATE 12/05/2009  . Atrial fibrillation ([facility])   . Atrial fibrillation with RVR ([facility])  07/05/2017  . Atrial flutter ([facility]) 12/05/2009  . Atypical chest pain 11/08/2011  . BENIGN PROSTATIC HYPERTROPHY, HX OF 12/05/2009  . CHEST PAIN-UNSPECIFIED 11/11/2009  . CHF 12/05/2009  . Chronic renal insufficiency 12/16/2016  . Debility 04/18/2012  . Diarrhea 12/16/2016  . DYSPNEA 12/19/2009  . Edema 04/18/2012  . ERECTILE DYSFUNCTION 01/25/2007  . FATIGUE, CHRONIC 11/11/2009  . Gout 07/10/2012  . Heart murmur   . History of kidney stones    "they passed" (07/05/2017)  . Hyperkalemia 04/18/2012  . Hyperlipidemia   . Hypertension   . HYPERTENSION 01/25/2007  . Hypoxia 05/05/2011  . Insomnia 05/13/2016  . INSOMNIA, HX OF 01/25/2007  . Knee pain, bilateral 12/28/2010  . Knee pain, right 12/28/2010  . NEUROMA 01/05/2010  . OBESITY NOS 01/25/2007  . OSA on CPAP 04/06/2010  . PERIPHERAL NEUROPATHY 01/05/2010  . PULMONARY FUNCTION TESTS, ABNORMAL 02/02/2010  . Superficial thrombophlebitis of left leg 09/07/2011  . TESTICULAR HYPOFUNCTION 01/05/2010  . 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 Toe pain, right 07/10/2012  . Type II diabetes mellitus ([facility]) 12/05/2009  . UNSPECIFIED ANEMIA 01/05/2010   Past Surgical History:  Procedure Laterality Date  . A-FLUTTER ABLATION N/A 05/16/2019   Procedure: A-FLUTTER ABLATION;  Surgeon: Constance Haw, MD;  Location: [facility];  Service: Cardiovascular;  Laterality: N/A;  . ATRIAL FIBRILLATION ABLATION N/A 05/16/2019   Procedure: ATRIAL FIBRILLATION ABLATION;  Surgeon: Constance Haw, MD;  Location: [facility];  Service: Cardiovascular;  Laterality: N/A;  . CARDIAC CATHETERIZATION  10/16/2009   nonischemic cardiomyopathy  .  CARDIOVERSION N/A 03/25/2015   Procedure: CARDIOVERSION;  Surgeon: Pixie Casino, MD;  Location: [facility];  Service: Cardiovascular;  Laterality: N/A;  . CARDIOVERSION N/A 06/17/2017   Procedure: CARDIOVERSION;  Surgeon: Sanda Klein, MD;  Location: [facility];  Service: Cardiovascular;  Laterality: N/A;  . CARDIOVERSION N/A  07/08/2017   Procedure: CARDIOVERSION;  Surgeon: Fay Records, MD;  Location[facility];  Service: Cardiovascular;  Laterality: N/A;  . CARDIOVERSION N/A 02/27/2019   Procedure: CARDIOVERSION;  Surgeon: Sanda Klein, MD;  Location: [facility];  Service: Cardiovascular;  Laterality: N/A;  . METATARSAL OSTEOTOMY Bilateral ~ 1980   removed part of 5th metatarsal to corect curvature of toes   . RIGHT HEART CATH N/A 05/16/2019   Procedure: RIGHT HEART CATH;  Surgeon: Jolaine Artist, MD;  Location: [facility];  Service: Cardiovascular;  Laterality: N/A;  . TEE WITHOUT CARDIOVERSION  05/16/2019   Procedure: Transesophageal Echocardiogram ([place]);  Surgeon: Constance Haw, MD;  Location: [facility];  Service: Cardiovascular;;     Current Outpatient Medications  Medication Sig Dispense Refill  . albuterol (VENTOLIN HFA) 108 (90 Base) MCG/ACT inhaler Inhale 2 puffs into the lungs every 6 (six) hours as needed for wheezing or shortness of breath. 1 Inhaler 6  . allopurinol (ZYLOPRIM) 300 MG tablet Take 1 tablet by mouth once daily 90 tablet 0  . amiodarone (PACERONE) 200 MG tablet Take 2 tablets (400 mg total) by mouth daily.    . Blood Glucose Monitoring Suppl (ONETOUCH VERIO) w/Device KIT Use to check blood sugar TID as directed.  Dx Code: E11.9 1 kit 0  . cetirizine (ZYRTEC) 10 MG tablet Take 1 tablet (10 mg total) by mouth daily. 30 tablet 11  . Clobetasol Prop Emollient Base (CLOBETASOL PROPIONATE E) 0.05 % emollient cream Apply 1 application topically 2 (two) times daily as needed (insect bite). 60 g 1  . colchicine (COLCRYS) 0.6 MG tablet TAKE ONE TABLET BY MOUTH DAILY AS NEEDED FOR GOUT FLARE UPS. 30 tablet 0  . ELIQUIS 5 MG TABS tablet Take 1 tablet by mouth twice daily 60 tablet 10  . fish oil-omega-3 fatty acids 1000 MG capsule Take 1 g by mouth daily.     . furosemide (LASIX) 80 MG tablet Take 1 tablet (80 mg total) by mouth every Monday, Wednesday, and Friday.  And 1 tab as needed for swelling or shortness of breath 30 tablet 6  . glucose blood test strip (Verio) Use as instructed Check blood sugars TID DxE11.9 100 each 12  . glyBURIDE (DIABETA) 5 MG tablet 1 tab po q am and 1/2 tab po q pm 135 tablet 1  . hydrALAZINE (APRESOLINE) 25 MG tablet Take 1 tablet (25 mg total) by mouth 3 (three) times daily. 90 tablet 6  . HYDROcodone-acetaminophen (NORCO) 10-325 MG tablet Take 1 tablet by mouth every 8 (eight) hours as needed for moderate pain or severe pain. 90 tablet 0  . isosorbide mononitrate (IMDUR) 30 MG 24 hr tablet Take 1 tablet (30 mg total) by mouth daily. 30 tablet 11  . LORazepam (ATIVAN) 1 MG tablet Take 1 tablet (1 mg total) by mouth every 8 (eight) hours as needed for anxiety. 30 tablet 5  . magnesium oxide (MAG-OX) 400 MG tablet Take 1 tablet (400 mg total) by mouth 2 (two) times daily. 30 tablet 11  . metoprolol succinate (TOPROL-XL) 25 MG 24 hr tablet Take 0.5 tablets (12.5 mg total) by mouth 2 (two) times daily. [place]  tablet 6  . Multiple Vitamin (MULTIVITAMIN WITH MINERALS) TABS tablet Take 1 tablet by mouth daily.     . naproxen (NAPROSYN) 375 MG tablet Take 375 mg by mouth 2 (two) times daily as needed (knee pain.).     [name] potassium chloride SA (K-DUR,KLOR-CON) 20 MEQ tablet TAKE 1 TABLET BY MOUTH ONCE DAILY AND  EXTRA  TABLET  IF  WEIGHT  IS  ABOVE  210  LBS 180 tablet 2  . pravastatin (PRAVACHOL) 40 MG tablet Take 1 tablet by mouth once daily 90 tablet 3  . tamsulosin (FLOMAX) 0.4 MG CAPS capsule Take 1 capsule by mouth once daily 30 capsule 0  . temazepam (RESTORIL) 30 MG capsule Take 1 capsule (30 mg total) by mouth at bedtime. 30 capsule 5   No current facility-administered medications for this visit.    Allergies:   Sulfonamide derivatives and Zolpidem   Social History:  The patient  reports that he quit smoking about 39 years ago. He has a 10.00 pack-year smoking history. He has never used smokeless tobacco. He reports that he does  not drink alcohol or use drugs.   Family History:  The patient's family history includes Aneurysm in his sister; Cancer in his father; Clotting disorder in his mother; Diabetes in his mother and sister; Heart attack in his mother; Heart disease in his mother; Hyperlipidemia in his mother; Hypertension in his mother and sister; Leukemia in his maternal grandmother; Lung disease in his father; Other in his sister; Seizures in his sister; Stroke in his mother.    ROS:  Please see the history of present illness.   Otherwise, review of systems is positive for none.   All other systems are reviewed and negative.   PHYSICAL EXAM: VS:  BP 130/74   Pulse (!) 119   Ht 5' 11" (1.803 m)   Wt 198 lb (89.8 kg)   SpO2 96%   BMI 27.62 kg/m  , BMI Body mass index is 27.62 kg/m. GEN: Well nourished, well developed, in no acute distress  HEENT: normal  Neck: no JVD, carotid bruits, or masses Cardiac: Irregular, tachycardic, 2 out of 6 murmur at the base; no rubs, or gallops,no edema  Respiratory:  clear to auscultation bilaterally, normal work of breathing GI: soft, nontender, nondistended, + BS MS: no deformity or atrophy  Skin: warm and dry Neuro:  Strength and sensation are intact Psych: euthymic mood, full affect  EKG:  EKG is ordered today. Personal review of the ekg ordered shows atrial fibrillation, rate 110  Recent Labs: 05/29/2019: Magnesium 2.1 06/11/2019: TSH 1.91 07/11/2019: B Natriuretic Peptide 277.2 07/23/2019: ALT 23; Hemoglobin 10.7 Repeated and verified X2.; Platelets 230.0 08/02/2019: BUN 39; Creatinine, Ser 1.90; Potassium 4.1; Pro B Natriuretic peptide (BNP) 446.0; Sodium 140    Lipid Panel     Component Value Date/Time   CHOL 134 06/11/2019 1106   TRIG 83.0 06/11/2019 1106   HDL 41.50 06/11/2019 1106   CHOLHDL 3 06/11/2019 1106   VLDL 16.6 06/11/2019 1106   LDLCALC 76 06/11/2019 1106   LDLDIRECT 84.0 05/02/2018 1523     Wt Readings from Last 3 Encounters:  08/14/19  198 lb (89.8 kg)  08/02/19 191 lb (86.6 kg)  07/23/19 193 lb 6.4 oz (87.7 kg)      Other studies Reviewed: Additional studies/ records that were reviewed today include: TTE 06/16/17  Review of the above records today demonstrates:  - Left ventricle: The cavity size was normal. There was moderate  concentric hypertrophy. Systolic function was moderately to   severely reduced. The estimated ejection fraction was in the   range of 30% to 35%. Severe diffuse hypokinesis with distinct   regional wall motion abnormalities. There is akinesis of the   apical myocardium. The study is not technically sufficient to   allow evaluation of LV diastolic function. - Aortic valve: There was mild to moderate regurgitation directed   eccentrically in the LVOT and towards the mitral anterior   leaflet. - Aorta: Aortic root dimension: 40 mm (ED). Ascending aortic   diameter: 39 mm (S). - Aortic root: The aortic root was mildly dilated. - Ascending aorta: The ascending aorta was mildly dilated. - Mitral valve: Calcified annulus. Mildly thickened leaflets .   There was moderate regurgitation directed eccentrically and   posteriorly. - Left atrium: The atrium was mildly dilated. - Right ventricle: The cavity size was mildly dilated. Wall   thickness was normal. - Right atrium: The atrium was mildly dilated. - Pulmonary arteries: PA peak pressure: 43 mm Hg (S).  TTE 09/14/17 - Limited study for LVEF. Compared to a prior study in 06/2017, the   LVEF has improved to 42%.  ASSESSMENT AND PLAN:  1.  Paroxysmal atrial fibrillation/flutter: Currently on amiodarone and Eliquis.  He is status post atrial fibrillation ablation 05/16/2019.  Unfortunately he has gone back into atrial fibrillation.  He had a prior ablation, but was in acute systolic heart failure and thus ablation needed to be cut short.  He may require repeat ablation.  CHA2DS2-VASc of at least 3.   2.  Chronic systolic and diastolic heart  failure: Currently on optimal medical therapy with Toprol-XL, Imdur, and hydralazine.  Most recent echo shows an improved ejection fraction of 35 to 40%.  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  Unfortunately his weight has increased.  We will check a basic metabolic to see what his creatinine is and potentially increase his Lasix for a few days based on that.  This will likely also improve after cardioversion.  3.  Hypertension: Currently well controlled  Current medicines are reviewed at length with the patient today.   The patient does not have concerns regarding his medicines.  The following changes were made today: none  Labs/ tests ordered today include:  Orders Placed This Encounter  Procedures  . EKG 12-Lead     Disposition:   FU with Will Camnitz 3 months  Signed, Will Meredith [name], MD  08/14/2019 11:38 AM     [facility] [address] [place] [address] [phone] (office) [phone] (fax)

## 2019-08-15 ENCOUNTER — Other Ambulatory Visit (HOSPITAL_COMMUNITY): Payer: Self-pay

## 2019-08-15 ENCOUNTER — Other Ambulatory Visit: Payer: Self-pay | Admitting: *Deleted

## 2019-08-15 MED ORDER — AMIODARONE HCL 200 MG PO TABS: 400.0000 mg | ORAL_TABLET | Freq: Every day | ORAL | 1 refills | Status: DC

## 2019-08-15 NOTE — Anesthesia Preprocedure Evaluation (Addendum)
Anesthesia Evaluation  Patient identified by MRN, date of birth, ID band Patient awake    Reviewed: Allergy & Precautions, NPO status , Patient's Chart, lab work & pertinent test results  Airway Mallampati: II  TM Distance: >3 FB Neck ROM: Full    Dental no notable dental hx. (+) Teeth Intact, Dental Advisory Given   Pulmonary sleep apnea and Continuous Positive Airway Pressure Ventilation , former smoker,    Pulmonary exam normal breath sounds clear to auscultation       Cardiovascular hypertension, Pt. on medications and Pt. on home beta blockers +CHF  Normal cardiovascular exam+ dysrhythmias Atrial Fibrillation  Rhythm:Irregular Rate:Abnormal  07/20/19 Echo  Left ventricular ejection fraction, by estimation, is 35 to 40%.    Neuro/Psych negative neurological ROS  negative psych ROS   GI/Hepatic negative GI ROS, Neg liver ROS,   Endo/Other  diabetes, Type 2  Renal/GU Renal diseaseCr 2.01 K+ 5.01     Musculoskeletal negative musculoskeletal ROS (+)   Abdominal   Peds  Hematology  (+) anemia , Hgb 10.0   Anesthesia Other Findings   Reproductive/Obstetrics                           Anesthesia Physical Anesthesia Plan  ASA: IV  Anesthesia Plan: General   Post-op Pain Management:    Induction: Intravenous  PONV Risk Score and Plan: Treatment may vary due to age or medical condition  Airway Management Planned: Nasal Cannula and Natural Airway  Additional Equipment: None  Intra-op Plan:   Post-operative Plan:   Informed Consent: I have reviewed the patients History and Physical, chart, labs and discussed the procedure including the risks, benefits and alternatives for the proposed anesthesia with the patient or authorized representative who has indicated his/her understanding and acceptance.     Dental advisory given  Plan Discussed with:   Anesthesia Plan Comments:         Anesthesia Quick Evaluation

## 2019-08-16 ENCOUNTER — Ambulatory Visit (HOSPITAL_COMMUNITY): Payer: Medicare Other | Admitting: Anesthesiology

## 2019-08-16 ENCOUNTER — Encounter (HOSPITAL_COMMUNITY)
Admission: RE | Disposition: A | Discharge status: Home / Self Care | Payer: Self-pay | Source: Ambulatory Visit | Attending: Cardiovascular Disease

## 2019-08-16 ENCOUNTER — Other Ambulatory Visit: Payer: Self-pay

## 2019-08-16 ENCOUNTER — Ambulatory Visit (HOSPITAL_COMMUNITY)
Admission: RE | Admit: 2019-08-16 | Discharge: 2019-08-16 | Disposition: A | Discharge status: Home / Self Care | Payer: Medicare Other | Source: Ambulatory Visit | Attending: Cardiovascular Disease | Admitting: Cardiovascular Disease

## 2019-08-16 DIAGNOSIS — Z6827 Body mass index (BMI) 27.0-27.9, adult: Secondary | ICD-10-CM | POA: Diagnosis not present

## 2019-08-16 DIAGNOSIS — I110 Hypertensive heart disease with heart failure: Secondary | ICD-10-CM | POA: Diagnosis not present

## 2019-08-16 DIAGNOSIS — G4700 Insomnia, unspecified: Secondary | ICD-10-CM | POA: Diagnosis not present

## 2019-08-16 DIAGNOSIS — Z882 Allergy status to sulfonamides status: Secondary | ICD-10-CM | POA: Insufficient documentation

## 2019-08-16 DIAGNOSIS — Z7984 Long term (current) use of oral hypoglycemic drugs: Secondary | ICD-10-CM | POA: Insufficient documentation

## 2019-08-16 DIAGNOSIS — N400 Enlarged prostate without lower urinary tract symptoms: Secondary | ICD-10-CM | POA: Insufficient documentation

## 2019-08-16 DIAGNOSIS — E1122 Type 2 diabetes mellitus with diabetic chronic kidney disease: Secondary | ICD-10-CM | POA: Insufficient documentation

## 2019-08-16 DIAGNOSIS — I4891 Unspecified atrial fibrillation: Secondary | ICD-10-CM | POA: Diagnosis not present

## 2019-08-16 DIAGNOSIS — Z833 Family history of diabetes mellitus: Secondary | ICD-10-CM | POA: Insufficient documentation

## 2019-08-16 DIAGNOSIS — Z7901 Long term (current) use of anticoagulants: Secondary | ICD-10-CM | POA: Diagnosis not present

## 2019-08-16 DIAGNOSIS — N189 Chronic kidney disease, unspecified: Secondary | ICD-10-CM | POA: Insufficient documentation

## 2019-08-16 DIAGNOSIS — Z87891 Personal history of nicotine dependence: Secondary | ICD-10-CM | POA: Insufficient documentation

## 2019-08-16 DIAGNOSIS — G4733 Obstructive sleep apnea (adult) (pediatric): Secondary | ICD-10-CM | POA: Diagnosis not present

## 2019-08-16 DIAGNOSIS — I509 Heart failure, unspecified: Secondary | ICD-10-CM | POA: Diagnosis not present

## 2019-08-16 DIAGNOSIS — E669 Obesity, unspecified: Secondary | ICD-10-CM | POA: Diagnosis not present

## 2019-08-16 DIAGNOSIS — I4819 Other persistent atrial fibrillation: Secondary | ICD-10-CM

## 2019-08-16 DIAGNOSIS — I4892 Unspecified atrial flutter: Secondary | ICD-10-CM | POA: Diagnosis not present

## 2019-08-16 DIAGNOSIS — E119 Type 2 diabetes mellitus without complications: Secondary | ICD-10-CM | POA: Diagnosis not present

## 2019-08-16 DIAGNOSIS — Z8249 Family history of ischemic heart disease and other diseases of the circulatory system: Secondary | ICD-10-CM | POA: Insufficient documentation

## 2019-08-16 DIAGNOSIS — I5042 Chronic combined systolic (congestive) and diastolic (congestive) heart failure: Secondary | ICD-10-CM | POA: Diagnosis not present

## 2019-08-16 DIAGNOSIS — I480 Paroxysmal atrial fibrillation: Secondary | ICD-10-CM | POA: Insufficient documentation

## 2019-08-16 DIAGNOSIS — E785 Hyperlipidemia, unspecified: Secondary | ICD-10-CM | POA: Insufficient documentation

## 2019-08-16 DIAGNOSIS — I130 Hypertensive heart and chronic kidney disease with heart failure and stage 1 through stage 4 chronic kidney disease, or unspecified chronic kidney disease: Secondary | ICD-10-CM | POA: Insufficient documentation

## 2019-08-16 DIAGNOSIS — Z79899 Other long term (current) drug therapy: Secondary | ICD-10-CM | POA: Diagnosis not present

## 2019-08-16 HISTORY — PX: CARDIOVERSION: SHX1299

## 2019-08-16 LAB — GLUCOSE, CAPILLARY
Glucose-Capillary: 109 mg/dL — ABNORMAL HIGH (ref 70–99)
Glucose-Capillary: 52 mg/dL — ABNORMAL LOW (ref 70–99)
Glucose-Capillary: 106 mg/dL — ABNORMAL HIGH (ref 70–99)

## 2019-08-16 SURGERY — CARDIOVERSION
Anesthesia: General

## 2019-08-16 MED ORDER — PHENYLEPHRINE 40 MCG/ML (10ML) SYRINGE FOR IV PUSH (FOR BLOOD PRESSURE SUPPORT)
PREFILLED_SYRINGE | INTRAVENOUS | Status: DC | PRN
  Administered 2019-08-16 (×2): 80 ug via INTRAVENOUS

## 2019-08-16 MED ORDER — SODIUM CHLORIDE 0.9 % IV SOLN: INTRAVENOUS | Status: DC | PRN

## 2019-08-16 MED ORDER — PROPOFOL 10 MG/ML IV BOLUS
INTRAVENOUS | Status: DC | PRN
  Administered 2019-08-16: 50 mg via INTRAVENOUS

## 2019-08-16 MED ORDER — LIDOCAINE 2% (20 MG/ML) 5 ML SYRINGE
INTRAMUSCULAR | Status: DC | PRN
  Administered 2019-08-16: 50 mg via INTRAVENOUS

## 2019-08-16 MED ORDER — DEXTROSE 50 % IV SOLN
12.5000 g | Freq: Once | INTRAVENOUS | Status: AC
  Administered 2019-08-16: 12.5 g via INTRAVENOUS

## 2019-08-16 MED ORDER — DEXTROSE 50 % IV SOLN
INTRAVENOUS | Status: AC
  Filled 2019-08-16: qty 50

## 2019-08-16 NOTE — Anesthesia Postprocedure Evaluation (Signed)
Anesthesia Post Note  Patient: [name]  Procedure(s) Performed: CARDIOVERSION (N/A )     Patient location during evaluation: PACU Anesthesia Type: General Level of consciousness: awake and alert Pain management: pain level controlled Vital Signs Assessment: post-procedure vital signs reviewed and stable Respiratory status: spontaneous breathing, nonlabored ventilation, respiratory function stable and patient connected to nasal cannula oxygen Cardiovascular status: blood pressure returned to baseline and stable Postop Assessment: no apparent nausea or vomiting Anesthetic complications: no    Last Vitals:  Vitals:   08/16/19 0820 08/16/19 0830  BP: 90/60 (!) 85/56  Pulse: 72 66  Resp: 18 (!) 28  Temp:    SpO2: 94% 96%    Last Pain:  Vitals:   08/16/19 0817  TempSrc: Oral  PainSc: 0-No pain                 Barnet Glasgow

## 2019-08-16 NOTE — Op Note (Signed)
Procedure: Electrical Cardioversion Indications:  Atrial Fibrillation  Procedure Details:  Consent: Risks of procedure as well as the alternatives and risks of each were explained to the (patient/caregiver).  Consent for procedure obtained.  Time Out: Verified patient identification, verified procedure, site/side was marked, verified correct patient position, special equipment/implants available, medications/allergies/relevent history reviewed, required imaging and test results available.  Performed  Patient placed on cardiac monitor, pulse oximetry, supplemental oxygen as necessary.  Sedation given: propofol 50 mg IV, Dr. Valma Cava Pacer pads placed anterior and posterior chest.  Cardioverted 1 time(s).  Cardioversion with synchronized biphasic 150J shock.  Evaluation: Findings: Post procedure EKG shows: NSR Complications: None Patient did tolerate procedure well.  Time Spent Directly with the Patient:  30  minutes   Wasim Hurlbut 08/16/2019, 8:16 AM

## 2019-08-16 NOTE — Anesthesia Procedure Notes (Signed)
Procedure Name: General with mask airway Date/Time: 08/16/2019 8:14 AM Performed by: Imagene Riches, CRNA Pre-anesthesia Checklist: Patient identified, Emergency Drugs available, Suction available, Patient being monitored and Timeout performed Patient Re-evaluated:Patient Re-evaluated prior to induction Oxygen Delivery Method: Ambu bag

## 2019-08-16 NOTE — Transfer of Care (Signed)
Immediate Anesthesia Transfer of Care Note  Patient: [name]  Procedure(s) Performed: CARDIOVERSION (N/A )  Patient Location: Endoscopy Unit  Anesthesia Type:General  Level of Consciousness: drowsy  Airway & Oxygen Therapy: Patient Spontanous Breathing and Patient connected to nasal cannula oxygen  Post-op Assessment: Report given to RN and Post -op Vital signs reviewed and stable  Post vital signs: Reviewed and stable  Last Vitals:  Vitals Value Taken Time  BP 90/60   Temp    Pulse    Resp    SpO2 95     Last Pain:  Vitals:   08/16/19 0705  TempSrc: Oral  PainSc: 0-No pain         Complications: No apparent anesthesia complications

## 2019-08-16 NOTE — Interval H&P Note (Signed)
History and Physical Interval Note:  08/16/2019 3:34 PM  [name]  has presented today for surgery, with the diagnosis of AFIB.  The various methods of treatment have been discussed with the patient and family. After consideration of risks, benefits and other options for treatment, the patient has consented to  Procedure(s): CARDIOVERSION (N/A) as a surgical intervention.  The patient's history has been reviewed, patient examined, no change in status, stable for surgery.  I have reviewed the patient's chart and labs.  Questions were answered to the patient's satisfaction.     Tatiana Courter

## 2019-08-16 NOTE — Discharge Instructions (Signed)
Electrical Cardioversion   What can I expect after the procedure?  Your blood pressure, heart rate, breathing rate, and blood oxygen level will be monitored until you leave the hospital or clinic.  Your heart rhythm will be watched to make sure it does not change.  You may have some redness on the skin where the shocks were given. Follow these instructions at home:  Do not drive for 24 hours if you were given a sedative during your procedure.  Take over-the-counter and prescription medicines only as told by your health care provider.  Ask your health care provider how to check your pulse. Check it often.  Rest for 48 hours after the procedure or as told by your health care provider.  Avoid or limit your caffeine use as told by your health care provider.  Keep all follow-up visits as told by your health care provider. This is important. Contact a health care provider if:  You feel like your heart is beating too quickly or your pulse is not regular.  You have a serious muscle cramp that does not go away. Get help right away if:  You have discomfort in your chest.  You are dizzy or you feel faint.  You have trouble breathing or you are short of breath.  Your speech is slurred.  You have trouble moving an arm or leg on one side of your body.  Your fingers or toes turn cold or blue. Summary  Electrical cardioversion is the delivery of a jolt of electricity to restore a normal rhythm to the heart.  This procedure may be done right away in an emergency or may be a scheduled procedure if the condition is not an emergency.  Generally, this is a safe procedure.  After the procedure, check your pulse often as told by your health care provider. This information is not intended to replace advice given to you by your health care provider. Make sure you discuss any questions you have with your health care provider. Document Revised: 12/25/2018 Document Reviewed:  12/25/2018 Elsevier Patient Education  2020 [employer]  

## 2019-08-16 NOTE — Progress Notes (Addendum)
Hypoglycemic Event  CBG: 52  Treatment: D50 50 mL (25 gm)  Symptoms: None  Follow-up CBG: Time:800 CBG Result  Possible Reasons for Event: Inadequate meal intake  Comments/MD notified: Dr. Valma Cava with anesthesia verbal orders given to give D50 25 mL (12.5g)    [name]

## 2019-08-16 NOTE — Anesthesia Postprocedure Evaluation (Signed)
Anesthesia Post Note  Patient: [name]  Procedure(s) Performed: CARDIOVERSION (N/A )     Anesthesia Type: General    Last Vitals:  Vitals:   08/16/19 0820 08/16/19 0830  BP: 90/60 (!) 85/56  Pulse: 72 66  Resp: 18 (!) 28  Temp:    SpO2: 94% 96%    Last Pain:  Vitals:   08/16/19 0817  TempSrc: Oral  PainSc: 0-No pain                 Barnet Glasgow

## 2019-08-17 ENCOUNTER — Encounter: Payer: Self-pay | Admitting: *Deleted

## 2019-08-20 DIAGNOSIS — Z006 Encounter for examination for normal comparison and control in clinical research program: Secondary | ICD-10-CM

## 2019-08-20 NOTE — Research (Signed)
Patient called saying that he has a cough that seems to appear more after talking at length and/or with activity. Weight at home this morning per patient was 188 and there has been no Atrial fibrillation via his [facility] transmission since his Cardioversion on 08/16/19.  Spoke with Brittainy and she has suggested  patient to take 1 extra dose of 80 MG of lasix tomorrow 3/16  in addition to his M/W/F scheduled dose. Will f/u with patient.

## 2019-08-22 ENCOUNTER — Other Ambulatory Visit (HOSPITAL_COMMUNITY)
Admission: RE | Admit: 2019-08-22 | Discharge: 2019-08-22 | Disposition: A | Discharge status: Home / Self Care | Payer: Medicare Other | Source: Ambulatory Visit | Attending: Pulmonary Disease | Admitting: Pulmonary Disease

## 2019-08-22 DIAGNOSIS — Z01812 Encounter for preprocedural laboratory examination: Secondary | ICD-10-CM | POA: Diagnosis not present

## 2019-08-22 DIAGNOSIS — Z20822 Contact with and (suspected) exposure to covid-19: Secondary | ICD-10-CM | POA: Insufficient documentation

## 2019-08-22 LAB — SARS CORONAVIRUS 2 (TAT 6-24 HRS): SARS Coronavirus 2: NEGATIVE

## 2019-08-24 ENCOUNTER — Ambulatory Visit (HOSPITAL_BASED_OUTPATIENT_CLINIC_OR_DEPARTMENT_OTHER): Payer: Medicare Other | Attending: Adult Health | Admitting: Pulmonary Disease

## 2019-08-24 ENCOUNTER — Other Ambulatory Visit: Payer: Self-pay

## 2019-08-24 VITALS — Temp 97.3°F | Ht 71.0 in | Wt 187.0 lb

## 2019-08-24 DIAGNOSIS — I509 Heart failure, unspecified: Secondary | ICD-10-CM | POA: Insufficient documentation

## 2019-08-24 DIAGNOSIS — G4733 Obstructive sleep apnea (adult) (pediatric): Secondary | ICD-10-CM | POA: Insufficient documentation

## 2019-08-24 DIAGNOSIS — Z7901 Long term (current) use of anticoagulants: Secondary | ICD-10-CM | POA: Diagnosis not present

## 2019-08-24 DIAGNOSIS — Z7984 Long term (current) use of oral hypoglycemic drugs: Secondary | ICD-10-CM | POA: Diagnosis not present

## 2019-08-24 DIAGNOSIS — Z79899 Other long term (current) drug therapy: Secondary | ICD-10-CM | POA: Insufficient documentation

## 2019-08-24 DIAGNOSIS — G4731 Primary central sleep apnea: Secondary | ICD-10-CM

## 2019-08-27 ENCOUNTER — Other Ambulatory Visit: Payer: Self-pay

## 2019-08-28 DIAGNOSIS — G4733 Obstructive sleep apnea (adult) (pediatric): Secondary | ICD-10-CM

## 2019-08-28 NOTE — Procedures (Signed)
    Patient Name: [name], Batz Date: 08/24/2019 Gender: Male D.O.B: 12-16-1950 Age (years): 68 Referring Provider: Lynelle Smoke Parrett Height (inches): 71 Interpreting Physician: Chesley Mires MD, ABSM Weight (lbs): 217 RPSGT: Jorge Ny BMI: 30 MRN: [id number] Neck Size: 18.00  CLINICAL INFORMATION The patient is referred for a BiPAP titration to treat sleep apnea.  He has history of obstructive sleep apnea and has been on CPAP.  However, recent CPAP download showed frequent central apneas in setting of congestive heart failure.  SLEEP STUDY TECHNIQUE As per the AASM Manual for the Scoring of Sleep and Associated Events v2.3 (April 2016) with a hypopnea requiring 4% desaturations.  The channels recorded and monitored were frontal, central and occipital EEG, electrooculogram (EOG), submentalis EMG (chin), nasal and oral airflow, thoracic and abdominal wall motion, anterior tibialis EMG, snore microphone, electrocardiogram, and pulse oximetry. Bilevel positive airway pressure (BPAP) was initiated at the beginning of the study and titrated to treat sleep-disordered breathing.  MEDICATIONS Medications self-administered by patient taken the night of the study : ALLOPURINOL, TIKOSYN, METFORMIN, METOPROLOL, TEMAZEPAM, WARFARIN, AMIODARONE, ELIQUIS, MAGNESIUM OXIDE, METOPROLOL SUCCINATE  RESPIRATORY PARAMETERS Optimal IPAP Pressure (cm): 16 AHI at Optimal Pressure (/hr) 19.4 Optimal EPAP Pressure (cm): 12   Overall Minimal O2 (%): 75.0 Minimal O2 at Optimal Pressure (%): 88.0  SLEEP ARCHITECTURE Start Time: 11:13:55 PM Stop Time: 5:56:37 AM Total Time (min): 402.7 Total Sleep Time (min): 304 Sleep Latency (min): 8.7 Sleep Efficiency (%): 75.5% REM Latency (min): 95.5 WASO (min): 90.0 Stage N1 (%): 30.3% Stage N2 (%): 58.2% Stage N3 (%): 0.0% Stage R (%): 11.5 Supine (%): 25.99 Arousal Index (/hr): 22.5    CARDIAC DATA The 2 lead EKG demonstrated sinus rhythm. The mean  heart rate was 69.3 beats per minute. Other EKG findings include: PVCs.  LEG MOVEMENT DATA The total Periodic Limb Movements of Sleep (PLMS) were 0. The PLMS index was 0.0. A PLMS index of <15 is considered normal in adults.  IMPRESSIONS - He did best with Bipap 16/12 cm H2O.  He was observed in REM sleep at this pressure setting. - He was not tried on supplemental oxygen during this study.   DIAGNOSIS - Obstructive Sleep Apnea  - Central Sleep Apnea  RECOMMENDATIONS - Trial of BiPAP therapy on 16/12 cm H2O with a Medium size [employer] Full Face Mask Simplus mask and heated humidification.  [Electronically signed] 08/28/2019 08:58 AM  Chesley Mires MD, ABSM Diplomate, American Board of Sleep Medicine   NPI: [id number]

## 2019-08-30 ENCOUNTER — Other Ambulatory Visit: Payer: Self-pay

## 2019-08-30 ENCOUNTER — Other Ambulatory Visit: Payer: Self-pay | Admitting: Adult Health

## 2019-08-30 ENCOUNTER — Encounter: Payer: Self-pay | Admitting: Adult Health

## 2019-08-30 ENCOUNTER — Ambulatory Visit (INDEPENDENT_AMBULATORY_CARE_PROVIDER_SITE_OTHER): Payer: Medicare Other | Admitting: Adult Health

## 2019-08-30 ENCOUNTER — Ambulatory Visit (HOSPITAL_COMMUNITY)
Admission: RE | Admit: 2019-08-30 | Discharge: 2019-08-30 | Disposition: A | Discharge status: Home / Self Care | Payer: Medicare Other | Source: Ambulatory Visit | Attending: Adult Health | Admitting: Adult Health

## 2019-08-30 VITALS — BP 112/72 | HR 78 | Wt 195.4 lb

## 2019-08-30 DIAGNOSIS — Z79899 Other long term (current) drug therapy: Secondary | ICD-10-CM | POA: Diagnosis not present

## 2019-08-30 DIAGNOSIS — M109 Gout, unspecified: Secondary | ICD-10-CM | POA: Diagnosis not present

## 2019-08-30 DIAGNOSIS — R000 Tachycardia, unspecified: Secondary | ICD-10-CM | POA: Insufficient documentation

## 2019-08-30 DIAGNOSIS — I5042 Chronic combined systolic (congestive) and diastolic (congestive) heart failure: Secondary | ICD-10-CM | POA: Insufficient documentation

## 2019-08-30 DIAGNOSIS — G4733 Obstructive sleep apnea (adult) (pediatric): Secondary | ICD-10-CM

## 2019-08-30 DIAGNOSIS — R0600 Dyspnea, unspecified: Secondary | ICD-10-CM

## 2019-08-30 DIAGNOSIS — Z823 Family history of stroke: Secondary | ICD-10-CM | POA: Diagnosis not present

## 2019-08-30 DIAGNOSIS — Z7984 Long term (current) use of oral hypoglycemic drugs: Secondary | ICD-10-CM | POA: Diagnosis not present

## 2019-08-30 DIAGNOSIS — Z882 Allergy status to sulfonamides status: Secondary | ICD-10-CM | POA: Insufficient documentation

## 2019-08-30 DIAGNOSIS — Z8672 Personal history of thrombophlebitis: Secondary | ICD-10-CM | POA: Diagnosis not present

## 2019-08-30 DIAGNOSIS — Z87891 Personal history of nicotine dependence: Secondary | ICD-10-CM | POA: Diagnosis not present

## 2019-08-30 DIAGNOSIS — Z836 Family history of other diseases of the respiratory system: Secondary | ICD-10-CM | POA: Insufficient documentation

## 2019-08-30 DIAGNOSIS — I429 Cardiomyopathy, unspecified: Secondary | ICD-10-CM | POA: Diagnosis not present

## 2019-08-30 DIAGNOSIS — Z8349 Family history of other endocrine, nutritional and metabolic diseases: Secondary | ICD-10-CM | POA: Diagnosis not present

## 2019-08-30 DIAGNOSIS — Z833 Family history of diabetes mellitus: Secondary | ICD-10-CM | POA: Insufficient documentation

## 2019-08-30 DIAGNOSIS — E785 Hyperlipidemia, unspecified: Secondary | ICD-10-CM | POA: Diagnosis not present

## 2019-08-30 DIAGNOSIS — Z87442 Personal history of urinary calculi: Secondary | ICD-10-CM | POA: Diagnosis not present

## 2019-08-30 DIAGNOSIS — I130 Hypertensive heart and chronic kidney disease with heart failure and stage 1 through stage 4 chronic kidney disease, or unspecified chronic kidney disease: Secondary | ICD-10-CM | POA: Insufficient documentation

## 2019-08-30 DIAGNOSIS — Z888 Allergy status to other drugs, medicaments and biological substances status: Secondary | ICD-10-CM | POA: Insufficient documentation

## 2019-08-30 DIAGNOSIS — I5082 Biventricular heart failure: Secondary | ICD-10-CM | POA: Insufficient documentation

## 2019-08-30 DIAGNOSIS — Z8249 Family history of ischemic heart disease and other diseases of the circulatory system: Secondary | ICD-10-CM | POA: Insufficient documentation

## 2019-08-30 DIAGNOSIS — Z7901 Long term (current) use of anticoagulants: Secondary | ICD-10-CM | POA: Diagnosis not present

## 2019-08-30 DIAGNOSIS — I4819 Other persistent atrial fibrillation: Secondary | ICD-10-CM | POA: Diagnosis not present

## 2019-08-30 DIAGNOSIS — E1142 Type 2 diabetes mellitus with diabetic polyneuropathy: Secondary | ICD-10-CM | POA: Insufficient documentation

## 2019-08-30 DIAGNOSIS — E1122 Type 2 diabetes mellitus with diabetic chronic kidney disease: Secondary | ICD-10-CM | POA: Insufficient documentation

## 2019-08-30 DIAGNOSIS — N1832 Chronic kidney disease, stage 3b: Secondary | ICD-10-CM | POA: Diagnosis not present

## 2019-08-30 DIAGNOSIS — R0609 Other forms of dyspnea: Secondary | ICD-10-CM

## 2019-08-30 LAB — BASIC METABOLIC PANEL
Anion gap: 9 (ref 5–15)
BUN: 31 mg/dL — ABNORMAL HIGH (ref 8–23)
CO2: 25 mmol/L (ref 22–32)
Calcium: 9.4 mg/dL (ref 8.9–10.3)
Chloride: 107 mmol/L (ref 98–111)
Creatinine, Ser: 2.1 mg/dL — ABNORMAL HIGH (ref 0.61–1.24)
GFR calc Af Amer: 37 mL/min — ABNORMAL LOW (ref >60)
GFR calc non Af Amer: 32 mL/min — ABNORMAL LOW (ref >60)
Glucose, Bld: 88 mg/dL (ref 70–99)
Potassium: 4.3 mmol/L (ref 3.5–5.1)
Sodium: 141 mmol/L (ref 135–145)

## 2019-08-30 MED ORDER — AMIODARONE HCL 200 MG PO TABS: 200.0000 mg | ORAL_TABLET | Freq: Every day | ORAL | 1 refills | Status: DC

## 2019-08-30 MED ORDER — FUROSEMIDE 80 MG PO TABS: 80.0000 mg | ORAL_TABLET | Freq: Every day | ORAL | 6 refills | Status: DC

## 2019-08-30 NOTE — Patient Instructions (Signed)
Increase Furosemide to 80 mg daily  Decrease Amiodarone to 200 mg daily  Labs done today, your results will be available in MyChart, we will contact you for abnormal readings.  Your physician recommends that you schedule a follow-up appointment in: 2-3 weeks  If you have any questions or concerns before your next appointment please send [place] a message through [place] or call our office at [phone].  At the [facility], you and your health needs are our priority. As part of our continuing mission to provide you with exceptional heart care, we have created designated Provider Care Teams. These Care Teams include your primary Cardiologist (physician) and Advanced Practice Providers (APPs- Physician Assistants and Nurse Practitioners) who all work together to provide you with the care you need, when you need it.   You may see any of the following providers on your designated Care Team at your next follow up: [name] Dr [name] . Dr Loralie Champagne . Darrick Grinder, NP . Lyda Jester, PA . Audry Riles, PharmD   Please be sure to bring in all your medications bottles to every appointment.

## 2019-08-30 NOTE — Patient Instructions (Addendum)
Change to CPAP to  BIPAP At bedtime.  Goal is to wear for at least 6 -8 hrs each night.  Do not drive if sleepy.  Work on healthy weight.  Continue follow up with CHF clinic . Keep weight log.  Follow up with Dr. Elsworth Soho  in  2-3 months  and As needed   Please contact office for sooner follow up if symptoms do not improve or worsen or seek emergency care

## 2019-08-30 NOTE — Progress Notes (Signed)
ReDS Vest / Clip - 08/30/19 1300      ReDS Vest / Clip   Station Marker  C    Ruler Value  28    ReDS Value Range  (!) High volume overload    ReDS Actual Value  53

## 2019-08-30 NOTE — Assessment & Plan Note (Signed)
Appears to be euvolemic on exam today.  Continue follow-up with congestive heart failure clinic.  Low-salt diet. Clinically improved with current diuretics

## 2019-08-30 NOTE — Progress Notes (Signed)
Advanced Heart Failure Clinic Note   Referring Physician: PCP: [name], MD PCP-Cardiologist: Sanda Klein, MD  [facility]: Dr. 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  Admitted 05/16/19 for PVI with successful procedure. Post procedure he developed cardiogenic shock and respiratory failure. CCM consulted for intubation. Advanced HF team consulted. As he improved he was extubated.  Treated with pressors and gradually weaned off. HF meds initiated but unable to treat w/ ARB/ARNI nor spiro due to AKI, SCr peaking to 2.2. Tolerated Bidil and digoxin. ECHO initially showed EF 10% but improved 35-40% prior to discharge. Was discharged home on 05/29/19.   Had Linq placed 05/15/20 part of the ALLEIVATE HF Trial.   He went back in A fib 08/02/19. Saw pulmonary on 08/02/19 and was noted to have many central events. CPAP was to be changed to auto titration.  Last HF OV on 08/14/19 he was volume overloaded and instructed to take  80 mg lasix today, tomorrow he will take 80 mg twice, then he will go back to lasix 80 mg M-W-F.  On 08/16/19 he had successful cardioversion.   Today he returns for HF follow up. Overall feeling much better. Mild SOB with exertion.  Denies PND/Orthopnea. Using CPAP every night. Drinking over 100 ounces of water.  Following low salt diet. Appetite ok. No fever or chills. Weight at home 187-188 pounds. Taking all medications.    . Past Medical History:  Diagnosis Date  . AORTIC STENOSIS, MODERATE 12/05/2009  . Atrial fibrillation ([place])   . Atrial fibrillation with RVR ([place]) 07/05/2017  . Atrial flutter ([place]) 12/05/2009  . Atypical chest pain 11/08/2011  . BENIGN PROSTATIC HYPERTROPHY, HX OF 12/05/2009  . CHEST PAIN-UNSPECIFIED 11/11/2009  . CHF 12/05/2009  . Chronic renal insufficiency 12/16/2016  . Debility 04/18/2012  . Diarrhea 12/16/2016  . DYSPNEA 12/19/2009  . Edema 04/18/2012  .  ERECTILE DYSFUNCTION 01/25/2007  . FATIGUE, CHRONIC 11/11/2009  . Gout 07/10/2012  . Heart murmur   . History of kidney stones    "they passed" (07/05/2017)  . Hyperkalemia 04/18/2012  . Hyperlipidemia   . Hypertension   . HYPERTENSION 01/25/2007  . Hypoxia 05/05/2011  . Insomnia 05/13/2016  . INSOMNIA, HX OF 01/25/2007  . Knee pain, bilateral 12/28/2010  . Knee pain, right 12/28/2010  . NEUROMA 01/05/2010  . OBESITY NOS 01/25/2007  . OSA on CPAP 04/06/2010  . PERIPHERAL NEUROPATHY 01/05/2010  . PULMONARY FUNCTION TESTS, ABNORMAL 02/02/2010  . Superficial thrombophlebitis of left leg 09/07/2011  . TESTICULAR HYPOFUNCTION 01/05/2010  . 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 Toe pain, right 07/10/2012  . Type II diabetes mellitus ([place]) 12/05/2009  . UNSPECIFIED ANEMIA 01/05/2010    Current Outpatient Medications  Medication Sig Dispense Refill  . albuterol (VENTOLIN HFA) 108 (90 Base) MCG/ACT inhaler Inhale 2 puffs into the lungs every 6 (six) hours as needed for wheezing or shortness of breath. 1 Inhaler 6  . allopurinol (ZYLOPRIM) 300 MG tablet Take 1 tablet by mouth once daily 90 tablet 0  . amiodarone (PACERONE) 200 MG tablet Take 2 tablets (400 mg total) by mouth daily. 180 tablet 1  . Blood Glucose Monitoring Suppl (ONETOUCH VERIO) w/Device KIT Use to check blood sugar TID as directed.  Dx Code: E11.9 1 kit 0  . cetirizine (ZYRTEC) 10 MG tablet Take 1 tablet (10 mg total) by mouth daily. 30 tablet 11  .  Clobetasol Prop Emollient Base (CLOBETASOL PROPIONATE E) 0.05 % emollient cream Apply 1 application topically 2 (two) times daily as needed (insect bite). 60 g 1  . colchicine (COLCRYS) 0.6 MG tablet TAKE ONE TABLET BY MOUTH DAILY AS NEEDED FOR GOUT FLARE UPS. 30 tablet 0  . ELIQUIS 5 MG TABS tablet Take 1 tablet by mouth twice daily 60 tablet 10  . fish oil-omega-3 fatty acids 1000 MG capsule Take 1 g by mouth daily.     . furosemide (LASIX) 80 MG tablet Take 1 tablet (80 mg total) by mouth every Monday,  Wednesday, and Friday. And 1 tab as needed for swelling or shortness of breath 30 tablet 6  . glucose blood test strip (Verio) Use as instructed Check blood sugars TID DxE11.9 100 each 12  . glyBURIDE (DIABETA) 5 MG tablet 1 tab po q am and 1/2 tab po q pm 135 tablet 1  . hydrALAZINE (APRESOLINE) 25 MG tablet Take 1 tablet (25 mg total) by mouth 3 (three) times daily. 90 tablet 6  . HYDROcodone-acetaminophen (NORCO) 10-325 MG tablet Take 1 tablet by mouth every 8 (eight) hours as needed for moderate pain or severe pain. 90 tablet 0  . isosorbide mononitrate (IMDUR) 30 MG 24 hr tablet Take 1 tablet (30 mg total) by mouth daily. 30 tablet 11  . LORazepam (ATIVAN) 1 MG tablet Take 1 tablet (1 mg total) by mouth every 8 (eight) hours as needed for anxiety. 30 tablet 5  . magnesium oxide (MAG-OX) 400 MG tablet Take 1 tablet (400 mg total) by mouth 2 (two) times daily. 30 tablet 11  . metoprolol succinate (TOPROL-XL) 25 MG 24 hr tablet Take 0.5 tablets (12.5 mg total) by mouth 2 (two) times daily. 30 tablet 6  . Multiple Vitamin (MULTIVITAMIN WITH MINERALS) TABS tablet Take 1 tablet by mouth daily.     . naproxen (NAPROSYN) 375 MG tablet Take 375 mg by mouth 2 (two) times daily as needed (knee pain.).     [name] potassium chloride SA (K-DUR,KLOR-CON) 20 MEQ tablet TAKE 1 TABLET BY MOUTH ONCE DAILY AND  EXTRA  TABLET  IF  WEIGHT  IS  ABOVE  210  LBS 180 tablet 2  . pravastatin (PRAVACHOL) 40 MG tablet Take 1 tablet by mouth once daily 90 tablet 3  . tamsulosin (FLOMAX) 0.4 MG CAPS capsule Take 1 capsule by mouth once daily 30 capsule 0  . temazepam (RESTORIL) 30 MG capsule Take 1 capsule (30 mg total) by mouth at bedtime. 30 capsule 5   No current facility-administered medications for this encounter.    Allergies  Allergen Reactions  . Sulfonamide Derivatives Other (See Comments)    unknown reaction.  Patient states had to go to the ER.   [name] Zolpidem Other (See Comments)    Excessive, prolonged sedation        Social History   Socioeconomic History  . Marital status: Married    Spouse name: Not on file  . Number of children: 4  . Years of education: 4  . Highest education level: High school graduate  Occupational History  . Occupation: retired    Fish farm manager: [employer]  Tobacco Use  . Smoking status: Former Smoker    Packs/day: 1.00    Years: 10.00    Pack years: 10.00    Quit date: 06/07/1980    Years since quitting: 39.2  . Smokeless tobacco: Never Used  Substance and Sexual Activity  . Alcohol use: No  . Drug  use: No  . Sexual activity: Not Currently  Other Topics Concern  . Not on file  Social History Narrative   Lives with male partner in a one story home.  His son lives there off and on.  Retired from [employer].  Education: high school.  Right handed   Social Determinants of Health   Financial Resource Strain:   . Difficulty of Paying Living Expenses:   Food Insecurity:   . Worried About Charity fundraiser in the Last Year:   . Arboriculturist in the Last Year:   Transportation Needs:   . Film/video editor (Medical):   [name] Lack of Transportation (Non-Medical):   Physical Activity:   . Days of Exercise per Week:   . Minutes of Exercise per Session:   Stress:   . Feeling of Stress :   Social Connections:   . Frequency of Communication with Friends and Family:   . Frequency of Social Gatherings with Friends and Family:   . Attends Religious Services:   . Active Member of Clubs or Organizations:   . Attends Archivist Meetings:   [name] Marital Status:   Intimate Partner Violence:   . Fear of Current or Ex-Partner:   . Emotionally Abused:   [name] Physically Abused:   . Sexually Abused:       Family History  Problem Relation Age of Onset  . Clotting disorder Mother   . Heart disease Mother        s/p MI  . Heart attack Mother   . Hypertension Mother   . Diabetes Mother   . Hyperlipidemia Mother   . Stroke Mother   . Cancer Father        ?  lung  . Lung disease Father        smoker  . Diabetes Sister   . Hypertension Sister        smoker  . Leukemia Maternal Grandmother        ?  [name] Aneurysm Sister        brain  . Other Sister        clipped  . Seizures Sister        d/o w/aneurysm/ smoker    Vitals:   08/30/19 1335  BP: 112/72  Pulse: 78  SpO2: 96%  Weight: 88.6 kg (195 lb 6 oz)    Wt Readings from Last 3 Encounters:  08/30/19 88.6 kg (195 lb 6 oz)  08/30/19 88.9 kg (196 lb)  08/24/19 84.8 kg (187 lb)    ReDS Vest / Clip - 08/30/19 1300      ReDS Vest / Clip   Station Marker  C    Ruler Value  28    ReDS Value Range  (!) High volume overload    ReDS Actual Value  53       PHYSICAL EXAM: General:  Well appearing. No resp difficulty HEENT: normal Neck: supple. JVP 11-12 . Carotids 2+ bilat; no bruits. No lymphadenopathy or thryomegaly appreciated. Cor: PMI nondisplaced. Regular rate & rhythm. No rubs, gallops or murmurs. Lungs: clear Abdomen: soft, nontender, nondistended. No hepatosplenomegaly. No bruits or masses. Good bowel sounds. Extremities: no cyanosis, clubbing, rash, edema Neuro: alert & orientedx3, cranial nerves grossly intact. moves all 4 extremities w/o difficulty. Affect pleasant  EKG: NSR 77 bpm PR 278 ms  ASSESSMENT & PLAN: 1. 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  EF 10% by TEE 12/9 likely due to tachy CM Initially on pressors after PVI.  - Limited ECHO completed on 05/28/19 and showed EF improving with SR, 40-45%.  - ECHO 07/20/19: EF 35-40%  - Has Ling placement. - In ALLEVIATE HF study in the medical arm.  ? Possible cardiomems. I will discuss with Dr Haroldine Laws.  - Reds Clip 53%.  -NYHA III. Volume status elevated. Increase lasix to 80 mg daily, .- Continue Toprol 12.5 bid  - Continue Hydralazine + Imdur  - With advanced renal disease will not  titrate further given soft BP   2. Persistent AF - s/p successful PVI 12/9. Has LINQ - Tikosyn off due to AKI -S/P Cardioversion on 08/16/19 with conversion to NSR - Maintaining NSR with long PR. Cut back amiodarone to 200 mg daily - Continue Eliquis.  - Continue Toprol XL   3. CKD 3b -Most recent creatinine was 1.9  - Check BMET   4. OSA Saw pulmonary today. Plan to switch from CPAP to Bipap.   Check BMET today  Follow up in 2 weeks.    Darrick Grinder, NP 08/30/19

## 2019-08-30 NOTE — Progress Notes (Signed)
$'@Patient'v$  ID: [name], male    DOB: 02/16/1951, 69 y.o.   MRN: [id number]  Chief Complaint  Patient presents with  . Follow-up    OSA     Referring provider: [name], MD  HPI: Old male followed for obstructive sleep apnea and insomnia Medical history significant for nonischemic cardiomyopathy ,A Fib, CHF -remains on amiodarone and Eliquis Patient is underwent cardioversion September 2020 and March 2021.  AF ablation December 2020  TEST/EVENTS :  2D echo July 20, 2019 EF 35 to 40%  PSG dec '11>> - weighed 225 pounds,BMI of 31, neck size of 14 inches Sleep was very fragmented due to frequent arousals and awakenings, especially in the second half of the night with CPAP. He was desensitized with a standard nasal mask. Note that he took 2 tablets of temazepam of 15 mg prior to the study. >>AHI was 42/h with lowest desatn of 86 %(no centrals noted on original study>>corrected by CPAP of 8 cm with a nasal mask. Download 12/28 -07/02/10 >>good compliance avg 6.5h, AHI 15/h, centrals 9/h, obstructive 4.5/h, avg pr 8 cm  Download on 10 cm 2/28-3/28/12 >>few residual events, some central apneas persist   PFT ->01/2010 - TLC 77%, DLCO 56%, FVC 3.5L/72%, RAtio 77 ,FEV 1 78%  08/30/2019 follow-up obstructive sleep apnea Patient returns for a 1 month follow-up.  Patient has known underlying sleep apnea is on nocturnal CPAP.  He has excellent compliance however his number of events continues to climb especially with central events.  Was set up for a BiPAP titration study.  This showed best control was BiPAP IPAP 16, EPAP 12 cm H2O.  (At this level AHI 19.4/hour) O2 saturations 88% at optimal pressure setting We discussed his test results and discuss changing from a CPAP to BiPAP.  Patient is in agreement.  Discussed that he has underlying central sleep apnea along with his congestive heart failure. Patient says he has good and bad days.  Does not feel overly sleepy.  Feels  that he rest well at nighttime.  Has recently changed to a fullface mask which he feels is helping some.  Patient is followed by the congestive heart failure clinic.  He has difficulty with recurrent A. fib.  Underwent an ablation for recurrent A. fib earlier this month. He remains on aggressive diuresis.  He says over the last week he has felt some better with less swelling.  And less shortness of breath.  He also has chronic kidney disease.[name]    He has received both of his Covid vaccines   Allergies  Allergen Reactions  . Sulfonamide Derivatives Other (See Comments)    unknown reaction.  Patient states had to go to the ER.   [name] Zolpidem Other (See Comments)    Excessive, prolonged sedation    Immunization History  Administered Date(s) Administered  . Influenza Split 03/03/2011, 03/10/2012, 04/07/2018  . Influenza Whole 03/28/2008, 03/30/2010  . Influenza,inj,Quad PF,6+ Mos 01/31/2013, 03/07/2014, 04/14/2015, 02/19/2016, 03/18/2017  . Influenza-Unspecified 04/05/2019  . PFIZER SARS-COV-2 Vaccination 08/03/2019, 08/21/2019  . Pneumococcal Polysaccharide-23 10/07/2009  . Td 09/05/2009    Past Medical History:  Diagnosis Date  . AORTIC STENOSIS, MODERATE 12/05/2009  . Atrial fibrillation ([place])   . Atrial fibrillation with RVR ([place]) 07/05/2017  . Atrial flutter ([place]) 12/05/2009  . Atypical chest pain 11/08/2011  . BENIGN PROSTATIC HYPERTROPHY, HX OF 12/05/2009  . CHEST PAIN-UNSPECIFIED 11/11/2009  . CHF 12/05/2009  . Chronic renal insufficiency 12/16/2016  . Debility 04/18/2012  .  Diarrhea 12/16/2016  . DYSPNEA 12/19/2009  . Edema 04/18/2012  . ERECTILE DYSFUNCTION 01/25/2007  . FATIGUE, CHRONIC 11/11/2009  . Gout 07/10/2012  . Heart murmur   . History of kidney stones    "they passed" (07/05/2017)  . Hyperkalemia 04/18/2012  . Hyperlipidemia   . Hypertension   . HYPERTENSION 01/25/2007  . Hypoxia 05/05/2011  . Insomnia 05/13/2016  . INSOMNIA, HX OF 01/25/2007  . Knee pain, bilateral  12/28/2010  . Knee pain, right 12/28/2010  . NEUROMA 01/05/2010  . OBESITY NOS 01/25/2007  . OSA on CPAP 04/06/2010  . PERIPHERAL NEUROPATHY 01/05/2010  . PULMONARY FUNCTION TESTS, ABNORMAL 02/02/2010  . Superficial thrombophlebitis of left leg 09/07/2011  . TESTICULAR HYPOFUNCTION 01/05/2010  . 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 Toe pain, right 07/10/2012  . Type II diabetes mellitus ([place]) 12/05/2009  . UNSPECIFIED ANEMIA 01/05/2010    Tobacco History: Social History   Tobacco Use  Smoking Status Former Smoker  . Packs/day: 1.00  . Years: 10.00  . Pack years: 10.00  . Quit date: 06/07/1980  . Years since quitting: 39.2  Smokeless Tobacco Never Used   Counseling given: Not Answered   Outpatient Medications Prior to Visit  Medication Sig Dispense Refill  . albuterol (VENTOLIN HFA) 108 (90 Base) MCG/ACT inhaler Inhale 2 puffs into the lungs every 6 (six) hours as needed for wheezing or shortness of breath. 1 Inhaler 6  . allopurinol (ZYLOPRIM) 300 MG tablet Take 1 tablet by mouth once daily 90 tablet 0  . amiodarone (PACERONE) 200 MG tablet Take 2 tablets (400 mg total) by mouth daily. 180 tablet 1  . Blood Glucose Monitoring Suppl (ONETOUCH VERIO) w/Device KIT Use to check blood sugar TID as directed.  Dx Code: E11.9 1 kit 0  . cetirizine (ZYRTEC) 10 MG tablet Take 1 tablet (10 mg total) by mouth daily. 30 tablet 11  . Clobetasol Prop Emollient Base (CLOBETASOL PROPIONATE E) 0.05 % emollient cream Apply 1 application topically 2 (two) times daily as needed (insect bite). 60 g 1  . colchicine (COLCRYS) 0.6 MG tablet TAKE ONE TABLET BY MOUTH DAILY AS NEEDED FOR GOUT FLARE UPS. 30 tablet 0  . ELIQUIS 5 MG TABS tablet Take 1 tablet by mouth twice daily 60 tablet 10  . fish oil-omega-3 fatty acids 1000 MG capsule Take 1 g by mouth daily.     . furosemide (LASIX) 80 MG tablet Take 1 tablet (80 mg total) by mouth every Monday, Wednesday, and Friday. And 1 tab as needed for swelling or shortness of breath 30  tablet 6  . glucose blood test strip (Verio) Use as instructed Check blood sugars TID DxE11.9 100 each 12  . glyBURIDE (DIABETA) 5 MG tablet 1 tab po q am and 1/2 tab po q pm 135 tablet 1  . hydrALAZINE (APRESOLINE) 25 MG tablet Take 1 tablet (25 mg total) by mouth 3 (three) times daily. 90 tablet 6  . HYDROcodone-acetaminophen (NORCO) 10-325 MG tablet Take 1 tablet by mouth every 8 (eight) hours as needed for moderate pain or severe pain. 90 tablet 0  . isosorbide mononitrate (IMDUR) 30 MG 24 hr tablet Take 1 tablet (30 mg total) by mouth daily. 30 tablet 11  . LORazepam (ATIVAN) 1 MG tablet Take 1 tablet (1 mg total) by mouth every 8 (eight) hours as needed for anxiety. 30 tablet 5  . magnesium oxide (MAG-OX) 400 MG tablet Take 1 tablet (400 mg total) by mouth 2 (two) times daily. 30 tablet  11  . metoprolol succinate (TOPROL-XL) 25 MG 24 hr tablet Take 0.5 tablets (12.5 mg total) by mouth 2 (two) times daily. 30 tablet 6  . Multiple Vitamin (MULTIVITAMIN WITH MINERALS) TABS tablet Take 1 tablet by mouth daily.     . naproxen (NAPROSYN) 375 MG tablet Take 375 mg by mouth 2 (two) times daily as needed (knee pain.).     [name] potassium chloride SA (K-DUR,KLOR-CON) 20 MEQ tablet TAKE 1 TABLET BY MOUTH ONCE DAILY AND  EXTRA  TABLET  IF  WEIGHT  IS  ABOVE  210  LBS 180 tablet 2  . pravastatin (PRAVACHOL) 40 MG tablet Take 1 tablet by mouth once daily 90 tablet 3  . tamsulosin (FLOMAX) 0.4 MG CAPS capsule Take 1 capsule by mouth once daily 30 capsule 0  . temazepam (RESTORIL) 30 MG capsule Take 1 capsule (30 mg total) by mouth at bedtime. 30 capsule 5   No facility-administered medications prior to visit.     Review of Systems:   Constitutional:   No  weight loss, night sweats,  Fevers, chills, + fatigue, or  lassitude.  HEENT:   No headaches,  Difficulty swallowing,  Tooth/dental problems, or  Sore throat,                No sneezing, itching, ear ache, nasal congestion, post nasal drip,   CV:  No  chest pain,  Orthopnea, PND,   palpitations, syncope.   GI  No heartburn, indigestion, abdominal pain, nausea, vomiting, diarrhea, change in bowel habits, loss of appetite, bloody stools.   Resp:    No chest wall deformity  Skin: no rash or lesions.  GU: no dysuria, change in color of urine, no urgency or frequency.  No flank pain, no hematuria   MS:  No joint pain or swelling.  No decreased range of motion.  No back pain.    Physical Exam  BP (!) 96/58 (BP Location: Left Arm, Cuff Size: Normal)   Pulse 72   Temp 97.7 F (36.5 C) (Temporal)   Ht '5\' 11"'$  (1.803 m)   Wt 196 lb (88.9 kg)   SpO2 99% Comment: RA  BMI 27.34 kg/m   GEN: A/Ox3; pleasant , NAD, elderly   HEENT:  [place]/AT,  NOSE-clear, THROAT-clear, no lesions, no postnasal drip or exudate noted.   NECK:  Supple w/ fair ROM; no JVD; normal carotid impulses w/o bruits; no thyromegaly or nodules palpated; no lymphadenopathy.    RESP  Clear  P & A; w/o, wheezes/ rales/ or rhonchi. no accessory muscle use, no dullness to percussion  CARD:  RRR, no m/r/g, 1+ peripheral edema, pulses intact, no cyanosis or clubbing.  GI:   Soft & nt; nml bowel sounds; no organomegaly or masses detected.   Musco: Warm bil, no deformities or joint swelling noted.   Neuro: alert, no focal deficits noted.    Skin: Warm, no lesions or rashes    Lab Results:  CBC    Component Value Date/Time   WBC 4.3 08/14/2019 1124   WBC 4.7 07/23/2019 1141   RBC 4.12 (L) 08/14/2019 1124   RBC 4.16 (L) 07/23/2019 1141   HGB 10.8 (L) 08/14/2019 1124   HCT 34.5 (L) 08/14/2019 1124   PLT 212 08/14/2019 1124   MCV 84 08/14/2019 1124   MCH 26.2 (L) 08/14/2019 1124   MCH 25.4 (L) 07/11/2019 1118   MCHC 31.3 (L) 08/14/2019 1124   MCHC 31.3 07/23/2019 1141   RDW 16.0 (H) 08/14/2019  1124   LYMPHSABS 1.7 12/16/2016 1121   MONOABS 0.7 12/16/2016 1121   EOSABS 0.1 12/16/2016 1121   BASOSABS 0.0 12/16/2016 1121    BMET    Component Value Date/Time    NA 141 08/14/2019 1124   K 5.0 08/14/2019 1124   CL 103 08/14/2019 1124   CO2 23 08/14/2019 1124   GLUCOSE 159 (H) 08/14/2019 1124   GLUCOSE 50 (L) 08/02/2019 1506   BUN 39 (H) 08/14/2019 1124   CREATININE 2.01 (H) 08/14/2019 1124   CREATININE 1.39 (H) 03/24/2015 0943   CALCIUM 9.7 08/14/2019 1124   GFRNONAA 34 (L) 08/14/2019 1124   GFRAA 39 (L) 08/14/2019 1124    BNP    Component Value Date/Time   BNP 277.2 (H) 07/11/2019 1118    ProBNP    Component Value Date/Time   PROBNP 446.0 (H) 08/02/2019 1506    Imaging: DG Chest 2 View  Result Date: 08/01/2019 CLINICAL DATA:  Cough. EXAM: CHEST - 2 VIEW COMPARISON:  Most recent radiograph 05/16/2019. FINDINGS: Implanted loop recorder projects over the left chest wall. Upper normal heart size with normal mediastinal contours. Aortic atherosclerosis. Mild scarring in the right middle lobe. Minimal interstitial thickening with Kerley B-lines suspicious for mild pulmonary edema. Trace blunting of left costophrenic angle, likely small effusion. No pneumothorax. No acute osseous abnormalities. IMPRESSION: 1. Borderline cardiomegaly. Mild interstitial thickening with Kerley B-lines suspicious for mild pulmonary edema. 2. Scarring in the right middle lobe. Electronically Signed   By: Keith Rake M.D.   On: 08/01/2019 17:14      No flowsheet data found.  No results found for: NITRICOXIDE      Assessment & Plan:   OSA (obstructive sleep apnea) Obstructive sleep apnea/central sleep apnea-she has underlying congestive heart failure.  Has been difficult to control number of events despite his excellent compliance on CPAP.  BiPAP titration study showed persistent events however decreased on optimal pressure on BiPAP with IPAP at 16 cm H2O and EPAP 12 cm H2O. We will switch over to BiPAP with the above settings.  And close follow-up in 2 months  Plan  Patient Instructions  Change to CPAP to  BIPAP At bedtime.  Goal is to wear for  at least 6 -8 hrs each night.  Do not drive if sleepy.  Work on healthy weight.  Continue follow up with CHF clinic . Keep weight log.  Follow up with Dr. Elsworth Soho  in  2-3 months  and As needed   Please contact office for sooner follow up if symptoms do not improve or worsen or seek emergency care         Chronic combined systolic and diastolic heart failure ([place]) Appears to be euvolemic on exam today.  Continue follow-up with congestive heart failure clinic.  Low-salt diet. Clinically improved with current diuretics     Rexene Edison, NP 08/30/2019

## 2019-08-30 NOTE — Assessment & Plan Note (Signed)
Obstructive sleep apnea/central sleep apnea-she has underlying congestive heart failure.  Has been difficult to control number of events despite his excellent compliance on CPAP.  BiPAP titration study showed persistent events however decreased on optimal pressure on BiPAP with IPAP at 16 cm H2O and EPAP 12 cm H2O. We will switch over to BiPAP with the above settings.  And close follow-up in 2 months  Plan  Patient Instructions  Change to CPAP to  BIPAP At bedtime.  Goal is to wear for at least 6 -8 hrs each night.  Do not drive if sleepy.  Work on healthy weight.  Continue follow up with CHF clinic . Keep weight log.  Follow up with Dr. Elsworth Soho  in  2-3 months  and As needed   Please contact office for sooner follow up if symptoms do not improve or worsen or seek emergency care

## 2019-09-12 ENCOUNTER — Other Ambulatory Visit (HOSPITAL_COMMUNITY): Payer: Self-pay

## 2019-09-12 ENCOUNTER — Other Ambulatory Visit: Payer: Self-pay

## 2019-09-12 DIAGNOSIS — Z006 Encounter for examination for normal comparison and control in clinical research program: Secondary | ICD-10-CM

## 2019-09-12 MED ORDER — METOPROLOL SUCCINATE ER 25 MG PO TB24: 12.5000 mg | ORAL_TABLET | Freq: Two times a day (BID) | ORAL | 3 refills | Status: DC

## 2019-09-12 NOTE — Research (Addendum)
Alleviate Research Study 4 Month Follow up  Patient doing well at this time, currently in [place] following Cardioversion on 08/16/2019. No adverse events since. Will continue to monitor [facility] transmissions and will follow up at 6 months.   Current Outpatient Medications:    albuterol (VENTOLIN HFA) 108 (90 Base) MCG/ACT inhaler, Inhale 2 puffs into the lungs every 6 (six) hours as needed for wheezing or shortness of breath., Disp: 1 Inhaler, Rfl: 6   allopurinol (ZYLOPRIM) 300 MG tablet, Take 1 tablet by mouth once daily, Disp: 90 tablet, Rfl: 0   amiodarone (PACERONE) 200 MG tablet, Take 1 tablet (200 mg total) by mouth daily., Disp: 90 tablet, Rfl: 1   Blood Glucose Monitoring Suppl (ONETOUCH VERIO) w/Device KIT, Use to check blood sugar TID as directed.  Dx Code: E11.9, Disp: 1 kit, Rfl: 0   cetirizine (ZYRTEC) 10 MG tablet, Take 1 tablet (10 mg total) by mouth daily., Disp: 30 tablet, Rfl: 11   Clobetasol Prop Emollient Base (CLOBETASOL PROPIONATE E) 0.05 % emollient cream, Apply 1 application topically 2 (two) times daily as needed (insect bite)., Disp: 60 g, Rfl: 1   colchicine (COLCRYS) 0.6 MG tablet, TAKE ONE TABLET BY MOUTH DAILY AS NEEDED FOR GOUT FLARE UPS., Disp: 30 tablet, Rfl: 0   ELIQUIS 5 MG TABS tablet, Take 1 tablet by mouth twice daily, Disp: 60 tablet, Rfl: 10   fish oil-omega-3 fatty acids 1000 MG capsule, Take 1 g by mouth daily. , Disp: , Rfl:    furosemide (LASIX) 80 MG tablet, Take 1 tablet (80 mg total) by mouth daily. 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, Disp: 100 each, Rfl: 12   glyBURIDE (DIABETA) 5 MG tablet, 1 tab po q am and 1/2 tab po q pm, Disp: 135 tablet, Rfl: 1   hydrALAZINE (APRESOLINE) 25 MG tablet, Take 1 tablet (25 mg total) by mouth 3 (three) times daily., Disp: 90 tablet, Rfl: 6   HYDROcodone-acetaminophen (NORCO) 10-325 MG  tablet, Take 1 tablet by mouth every 8 (eight) hours as needed for moderate pain or severe pain., Disp: 90 tablet, Rfl: 0   isosorbide mononitrate (IMDUR) 30 MG 24 hr tablet, Take 1 tablet (30 mg total) by mouth daily., Disp: 30 tablet, Rfl: 11   LORazepam (ATIVAN) 1 MG tablet, Take 1 tablet (1 mg total) by mouth every 8 (eight) hours as needed for anxiety., Disp: 30 tablet, Rfl: 5   magnesium oxide (MAG-OX) 400 MG tablet, Take 1 tablet (400 mg total) by mouth 2 (two) times daily., Disp: 30 tablet, Rfl: 11   metoprolol succinate (TOPROL-XL) 25 MG 24 hr tablet, Take 0.5 tablets (12.5 mg total) by mouth 2 (two) times daily., Disp: 30 tablet, Rfl: 6   Multiple Vitamin (MULTIVITAMIN WITH MINERALS) TABS tablet, Take 1 tablet by mouth daily. , Disp: , Rfl:    naproxen (NAPROSYN) 375 MG tablet, Take 375 mg by mouth 2 (two) times daily as needed (knee pain.). , Disp: , Rfl:    potassium chloride SA (K-DUR,KLOR-CON) 20 MEQ tablet, TAKE 1 TABLET BY MOUTH ONCE DAILY AND  EXTRA  TABLET  IF  WEIGHT  IS  ABOVE  210  LBS, Disp: 180 tablet, Rfl: 2   pravastatin (PRAVACHOL) 40 MG tablet, Take 1 tablet by mouth once daily, Disp: 90 tablet, Rfl: 3   tamsulosin (FLOMAX) 0.4 MG CAPS  capsule, Take 1 capsule by mouth once daily, Disp: 30 capsule, Rfl: 0   temazepam (RESTORIL) 30 MG capsule, Take 1 capsule (30 mg total) by mouth at bedtime., Disp: 30 capsule, Rfl: 5  Occasional sustained, but brief episodes recorded on loop recorder April 5 download, maximum 10 minutes, probably atrial flutter with variable AV block, with mild tachycardia.

## 2019-09-13 NOTE — Progress Notes (Signed)
Patient ID: [name], male   DOB: Dec 17, 1950, 69 y.o.   MRN: [id number]  This visit occurred during the SARS-CoV-2 public health emergency.  Safety protocols were in place, including screening questions prior to the visit, additional usage of staff PPE, and extensive cleaning of exam room while observing appropriate contact time as indicated for disinfecting solutions.   HPI: [name] is a 69 y.o.-year-old male, referred by his cardiologist, Dr. Sallyanne Kuster, for management of DM2, dx in 2011, non-insulin-dependent, uncontrolled, with complications (CHF, CKD, ED).  Reviewed HbA1c: Lab Results  Component Value Date   HGBA1C 6.4 06/11/2019   HGBA1C 5.8 02/06/2019   HGBA1C 5.7 08/03/2018   HGBA1C 7.0 (H) 05/02/2018   HGBA1C 6.7 (H) 01/24/2018   HGBA1C 6.2 10/21/2017   HGBA1C 7.2 (H) 03/18/2017   HGBA1C 7.0 (H) 11/15/2016   HGBA1C 7.5 (H) 08/20/2016   HGBA1C 6.8 (H) 05/21/2016   HGBA1C 6.7 (H) 02/18/2016   HGBA1C 6.4 11/17/2015   HGBA1C 6.4 07/15/2015   HGBA1C 6.6 (H) 04/14/2015   HGBA1C 6.1 01/07/2015   HGBA1C 6.8 (H) 09/13/2014   HGBA1C 6.8 (H) 06/13/2014   HGBA1C 6.6 (H) 01/09/2014   HGBA1C 6.4 (H) 10/08/2013   HGBA1C 7.0 (H) 07/11/2013   HGBA1C 6.6 (H) 01/31/2013   HGBA1C 5.9 (H) 10/31/2012   HGBA1C 6.8 (H) 06/22/2012   HGBA1C 6.7 (H) 03/08/2012   HGBA1C 6.6 (H) 11/04/2011   HGBA1C 7.3 (H) 07/22/2011   HGBA1C 8.3 (H) 03/24/2011   HGBA1C 7.1 (H) 12/14/2010   HGBA1C (H) 09/16/2010    7.6 (NOTE)                                                                       According to the ADA Clinical Practice Recommendations for 2011, when HbA1c is used as a screening test:   >=6.5%   Diagnostic of Diabetes Mellitus           (if abnormal result  is confirmed)  5.7-6.4%   Increased risk of developing Diabetes Mellitus  References:Diagnosis and Classification of Diabetes Mellitus,Diabetes Care,2011,34(Suppl 1):S62-S69 and Standards of Medical Care in         Diabetes -  2011,Diabetes Care,2011,34  (Suppl 1):S11-S61.   HGBA1C (H) 09/15/2010    7.6 (NOTE)                                                                       According to the ADA Clinical Practice Recommendations for 2011, when HbA1c is used as a screening test:   >=6.5%   Diagnostic of Diabetes Mellitus           (if abnormal result  is confirmed)  5.7-6.4%   Increased risk of developing Diabetes Mellitus  References:Diagnosis and Classification of Diabetes Mellitus,Diabetes YWVP,7106,26(RSWNI 1):S62-S69 and Standards of Medical Care in         Diabetes - 2011,Diabetes OEVO,3500,93  (Suppl 1):S11-S61.   Pt is on a regimen of: - Glyburide 5 >> 2.5 mg  after b'fast and 0-2.5 mg 2h after dinner He was on Metformin.  Pt checks his sugars 2x a day and they are: - am: 60-94 - 2h after b'fast: n/c - before lunch: n/c - 2h after lunch: n/c - before dinner: n/c - 2h after dinner: n/c - bedtime: 68-155, 176, 376  - nighttime: n/c Lowest sugar was 60; he has hypoglycemia awareness - 50s.  Highest sugar was 376 (cheese cake).  Glucometer: One Touch verio IQ  Pt's meals are: - Breakfast: oatmeal + berries/apples + cinnamon, cereal, fruit - Lunch: light meal: ensure, fruit - Dinner: chicken or fish, ground [place] + veggies, occasionally fried foods (1x a mo) - Snacks: cranberry juice, grape juice, aple juice >> diluted by at least 50%;   -+ CKD - sees Dr. Juleen [name], last BUN/creatinine:  Lab Results  Component Value Date   BUN 31 (H) 08/30/2019   BUN 39 (H) 08/14/2019   CREATININE 2.10 (H) 08/30/2019   CREATININE 2.01 (H) 08/14/2019  He is not on ACE inhibitor/ARB.  -+ HL; last set of lipids: Lab Results  Component Value Date   CHOL 134 06/11/2019   HDL 41.50 06/11/2019   LDLCALC 76 06/11/2019   LDLDIRECT 84.0 05/02/2018   TRIG 83.0 06/11/2019   CHOLHDL 3 06/11/2019  On pravastatin 40, omega-3 fatty acids.  - last eye exam was in Spring 2020. Reportedly No DR. + glaucoma.   - no  numbness and tingling in his feet.  Pt has FH of DM in mother, sister.  He is on Amiodarone, dose reduced from 400 to 200 mg 2 mo ago.  ROS: Constitutional: no weight gain, no weight loss, no fatigue, no subjective hyperthermia, no subjective hypothermia, no nocturia Eyes: no blurry vision, no xerophthalmia ENT: no sore throat, no nodules palpated in neck, no dysphagia, no odynophagia, no hoarseness, no tinnitus, no hypoacusis Cardiovascular: no CP, no SOB, no palpitations, no leg swelling Respiratory: no cough, no SOB, no wheezing Gastrointestinal: no N, no V, no D, no C, no acid reflux Musculoskeletal: no muscle, no joint aches Skin: no rash, no hair loss Neurological: no tremors, no numbness or tingling/no dizziness/no HAs Psychiatric: no depression, no anxiety  Past Medical History:  Diagnosis Date  . AORTIC STENOSIS, MODERATE 12/05/2009  . Atrial fibrillation ([place])   . Atrial fibrillation with RVR ([place]) 07/05/2017  . Atrial flutter ([place]) 12/05/2009  . Atypical chest pain 11/08/2011  . BENIGN PROSTATIC HYPERTROPHY, HX OF 12/05/2009  . CHEST PAIN-UNSPECIFIED 11/11/2009  . CHF 12/05/2009  . Chronic renal insufficiency 12/16/2016  . Debility 04/18/2012  . Diarrhea 12/16/2016  . DYSPNEA 12/19/2009  . Edema 04/18/2012  . ERECTILE DYSFUNCTION 01/25/2007  . FATIGUE, CHRONIC 11/11/2009  . Gout 07/10/2012  . Heart murmur   . History of kidney stones    "they passed" (07/05/2017)  . Hyperkalemia 04/18/2012  . Hyperlipidemia   . Hypertension   . HYPERTENSION 01/25/2007  . Hypoxia 05/05/2011  . Insomnia 05/13/2016  . INSOMNIA, HX OF 01/25/2007  . Knee pain, bilateral 12/28/2010  . Knee pain, right 12/28/2010  . NEUROMA 01/05/2010  . OBESITY NOS 01/25/2007  . OSA on CPAP 04/06/2010  . PERIPHERAL NEUROPATHY 01/05/2010  . PULMONARY FUNCTION TESTS, ABNORMAL 02/02/2010  . Superficial thrombophlebitis of left leg 09/07/2011  . TESTICULAR HYPOFUNCTION 01/05/2010  . 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 Toe pain, right  07/10/2012  . Type II diabetes mellitus ([place]) 12/05/2009  . UNSPECIFIED ANEMIA 01/05/2010   Past Surgical History:  Procedure Laterality Date  .  A-FLUTTER ABLATION N/A 05/16/2019   Procedure: A-FLUTTER ABLATION;  Surgeon: Constance Haw, MD;  Location: [facility];  Service: Cardiovascular;  Laterality: N/A;  . ATRIAL FIBRILLATION ABLATION N/A 05/16/2019   Procedure: ATRIAL FIBRILLATION ABLATION;  Surgeon: Constance Haw, MD;  Location: [facility];  Service: Cardiovascular;  Laterality: N/A;  . CARDIAC CATHETERIZATION  10/16/2009   nonischemic cardiomyopathy  . CARDIOVERSION N/A 03/25/2015   Procedure: CARDIOVERSION;  Surgeon: Pixie Casino, MD;  Location: [facility];  Service: Cardiovascular;  Laterality: N/A;  . CARDIOVERSION N/A 06/17/2017   Procedure: CARDIOVERSION;  Surgeon: Sanda Klein, MD;  Location: [facility];  Service: Cardiovascular;  Laterality: N/A;  . CARDIOVERSION N/A 07/08/2017   Procedure: CARDIOVERSION;  Surgeon: Fay Records, MD;  Location: [facility];  Service: Cardiovascular;  Laterality: N/A;  . CARDIOVERSION N/A 02/27/2019   Procedure: CARDIOVERSION;  Surgeon: Sanda Klein, MD;  Location: [facility];  Service: Cardiovascular;  Laterality: N/A;  . CARDIOVERSION N/A 08/16/2019   Procedure: CARDIOVERSION;  Surgeon: Sanda Klein, MD;  Location: [facility];  Service: Cardiovascular;  Laterality: N/A;  . METATARSAL OSTEOTOMY Bilateral ~ 1980   removed part of 5th metatarsal to corect curvature of toes   . RIGHT HEART CATH N/A 05/16/2019   Procedure: RIGHT HEART CATH;  Surgeon: Jolaine Artist, MD;  Location: [facility];  Service: Cardiovascular;  Laterality: N/A;  . TEE WITHOUT CARDIOVERSION  05/16/2019   Procedure: Transesophageal Echocardiogram (Tee);  Surgeon: Constance Haw, MD;  Location: [facility];  Service: Cardiovascular;;   Social History   Socioeconomic History  . Marital status: Married    Spouse  name: Not on file  . Number of children: 4  . Years of education: 30  . Highest education level: High school graduate  Occupational History  . Occupation: retired    Fish farm manager: [employer]  Tobacco Use  . Smoking status: Former Smoker    Packs/day: 1.00    Years: 10.00    Pack years: 10.00    Quit date: 06/07/1980    Years since quitting: 39.2  . Smokeless tobacco: Never Used  Substance and Sexual Activity  . Alcohol use: No  . Drug use: No  . Sexual activity: Not Currently  Other Topics Concern  . Not on file  Social History Narrative   Lives with male partner in a one story home.  His son lives there off and on.  Retired from [employer].  Education: high school.  Right handed   Social Determinants of Health   Financial Resource Strain:   . Difficulty of Paying Living Expenses:   Food Insecurity:   . Worried About Charity fundraiser in the Last Year:   . Arboriculturist in the Last Year:   Transportation Needs:   . Film/video editor (Medical):   [name] Lack of Transportation (Non-Medical):   Physical Activity:   . Days of Exercise per Week:   . Minutes of Exercise per Session:   Stress:   . Feeling of Stress :   Social Connections:   . Frequency of Communication with Friends and Family:   . Frequency of Social Gatherings with Friends and Family:   . Attends Religious Services:   . Active Member of Clubs or Organizations:   . Attends Archivist Meetings:   [name] Marital Status:   Intimate Partner Violence:   . Fear of Current or Ex-Partner:   . Emotionally Abused:   .  Physically Abused:   . Sexually Abused:    Current Outpatient Medications on File Prior to Visit  Medication Sig Dispense Refill  . albuterol (VENTOLIN HFA) 108 (90 Base) MCG/ACT inhaler Inhale 2 puffs into the lungs every 6 (six) hours as needed for wheezing or shortness of breath. 1 Inhaler 6  . allopurinol (ZYLOPRIM) 300 MG tablet Take 1 tablet by mouth once daily 90 tablet 0  . amiodarone  (PACERONE) 200 MG tablet Take 1 tablet (200 mg total) by mouth daily. 90 tablet 1  . Blood Glucose Monitoring Suppl (ONETOUCH VERIO) w/Device KIT Use to check blood sugar TID as directed.  Dx Code: E11.9 1 kit 0  . cetirizine (ZYRTEC) 10 MG tablet Take 1 tablet (10 mg total) by mouth daily. 30 tablet 11  . Clobetasol Prop Emollient Base (CLOBETASOL PROPIONATE E) 0.05 % emollient cream Apply 1 application topically 2 (two) times daily as needed (insect bite). 60 g 1  . colchicine (COLCRYS) 0.6 MG tablet TAKE ONE TABLET BY MOUTH DAILY AS NEEDED FOR GOUT FLARE UPS. 30 tablet 0  . ELIQUIS 5 MG TABS tablet Take 1 tablet by mouth twice daily 60 tablet 10  . fish oil-omega-3 fatty acids 1000 MG capsule Take 1 g by mouth daily.     . furosemide (LASIX) 80 MG tablet Take 1 tablet (80 mg total) by mouth daily. And 1 tab as needed for swelling or shortness of breath 30 tablet 6  . glucose blood test strip (Verio) Use as instructed Check blood sugars TID DxE11.9 100 each 12  . glyBURIDE (DIABETA) 5 MG tablet 1 tab po q am and 1/2 tab po q pm 135 tablet 1  . hydrALAZINE (APRESOLINE) 25 MG tablet Take 1 tablet (25 mg total) by mouth 3 (three) times daily. 90 tablet 6  . HYDROcodone-acetaminophen (NORCO) 10-325 MG tablet Take 1 tablet by mouth every 8 (eight) hours as needed for moderate pain or severe pain. 90 tablet 0  . isosorbide mononitrate (IMDUR) 30 MG 24 hr tablet Take 1 tablet (30 mg total) by mouth daily. 30 tablet 11  . LORazepam (ATIVAN) 1 MG tablet Take 1 tablet (1 mg total) by mouth every 8 (eight) hours as needed for anxiety. 30 tablet 5  . magnesium oxide (MAG-OX) 400 MG tablet Take 1 tablet (400 mg total) by mouth 2 (two) times daily. 30 tablet 11  . metoprolol succinate (TOPROL-XL) 25 MG 24 hr tablet Take 0.5 tablets (12.5 mg total) by mouth 2 (two) times daily. 90 tablet 3  . Multiple Vitamin (MULTIVITAMIN WITH MINERALS) TABS tablet Take 1 tablet by mouth daily.     . naproxen (NAPROSYN) 375 MG  tablet Take 375 mg by mouth 2 (two) times daily as needed (knee pain.).     [name] potassium chloride SA (K-DUR,KLOR-CON) 20 MEQ tablet TAKE 1 TABLET BY MOUTH ONCE DAILY AND  EXTRA  TABLET  IF  WEIGHT  IS  ABOVE  210  LBS 180 tablet 2  . pravastatin (PRAVACHOL) 40 MG tablet Take 1 tablet by mouth once daily 90 tablet 3  . tamsulosin (FLOMAX) 0.4 MG CAPS capsule Take 1 capsule by mouth once daily 30 capsule 0  . temazepam (RESTORIL) 30 MG capsule Take 1 capsule (30 mg total) by mouth at bedtime. 30 capsule 5   No current facility-administered medications on file prior to visit.   Allergies  Allergen Reactions  . Sulfonamide Derivatives Other (See Comments)    unknown reaction.  Patient states had  to go to the ER.   [name] Zolpidem Other (See Comments)    Excessive, prolonged sedation   Family History  Problem Relation Age of Onset  . Clotting disorder Mother   . Heart disease Mother        s/p MI  . Heart attack Mother   . Hypertension Mother   . Diabetes Mother   . Hyperlipidemia Mother   . Stroke Mother   . Cancer Father        ? lung  . Lung disease Father        smoker  . Diabetes Sister   . Hypertension Sister        smoker  . Leukemia Maternal Grandmother        ?  [name] Aneurysm Sister        brain  . Other Sister        clipped  . Seizures Sister        d/o w/aneurysm/ smoker    PE: BP 120/70   Pulse 74   Ht '5\' 11"'$  (1.803 m)   Wt 196 lb (88.9 kg)   SpO2 95%   BMI 27.34 kg/m  Wt Readings from Last 3 Encounters:  09/14/19 196 lb (88.9 kg)  08/30/19 195 lb 6 oz (88.6 kg)  08/30/19 196 lb (88.9 kg)   Constitutional: overweight, in NAD Eyes: PERRLA, EOMI, no exophthalmos ENT: moist mucous membranes, no thyromegaly, no cervical lymphadenopathy Cardiovascular: RRR, No MRG Respiratory: CTA B Gastrointestinal: abdomen soft, NT, ND, BS+ Musculoskeletal: no deformities, strength intact in all 4 Skin: moist, warm, no rashes Neurological: no tremor with outstretched hands,  DTR normal in all 4  ASSESSMENT: 1. DM2, non-insulin-dependent, uncontrolled, with long-term complications - s and d CHF - A fib - s/p cardioversion - 05/2019 >> went into cardiac shock - CKD - sees nephrology  PLAN:  1. Patient with long-standing, uncontrolled diabetes, on oral antidiabetic regimen, which became insufficient.  He is now only taking glyburide 5 mg in a.m. and 2.5 mg before dinner.  Latest HbA1c was 6.4% in 06/2019.  At today's visit, HbA1c was 5.3% (lower). -At this visit, we reviewed his sugars at home and they were mostly at goal running and occasionally later in the day.  He only had 1 significant high blood sugar in the 300s.  We discussed that glyburide is a medication that can drop his blood pressure too low.  I advised him to stop this and only use half a tablet before a larger meal or if he ate desserts and fried food.  States he is drinking Ensure and we discussed that ideally he would eat whole foods, not formula, but if he decides to continue with the meal replacements, he should try to start now. -Otherwise, we can continue without daily medication but I did advise him to check sugars consistently - I suggested to:  Patient Instructions  Try to replace Ensure with Glucerna.  Please stop Glyburide. You may take 1/2 tablet before a meal with dessert or fried food.  Check sugars 1x a day.  Please return in 3 months with your sugar log.   - Strongly advised him to start checking sugars at different times of the day - check 1-2x a day, rotating checks - discussed about CBG targets for treatment: 80-130 mg/dL before meals and <180 mg/dL after meals; target HbA1c <7%. - given sugar log and advised how to fill it and to bring it at next appt  - given foot  care handout and explained the principles  - given instructions for hypoglycemia management "15-15 rule"  - advised for yearly eye exams  - Return to clinic in 3 mo with sugar log   Philemon Kingdom, MD  PhD [facility] Endocrinology

## 2019-09-14 ENCOUNTER — Encounter: Payer: Self-pay | Admitting: Internal Medicine

## 2019-09-14 ENCOUNTER — Ambulatory Visit (INDEPENDENT_AMBULATORY_CARE_PROVIDER_SITE_OTHER): Payer: Medicare Other | Admitting: Internal Medicine

## 2019-09-14 ENCOUNTER — Other Ambulatory Visit: Payer: Self-pay

## 2019-09-14 DIAGNOSIS — E669 Obesity, unspecified: Secondary | ICD-10-CM | POA: Diagnosis not present

## 2019-09-14 DIAGNOSIS — E1169 Type 2 diabetes mellitus with other specified complication: Secondary | ICD-10-CM

## 2019-09-14 LAB — POCT GLYCOSYLATED HEMOGLOBIN (HGB A1C): Hemoglobin A1C: 5.3 % (ref 4.0–5.6)

## 2019-09-14 MED ORDER — GLYBURIDE 5 MG PO TABS: ORAL_TABLET | ORAL | 3 refills | Status: DC

## 2019-09-14 NOTE — Patient Instructions (Addendum)
Try to replace Ensure with Glucerna.  Please stop Glyburide. You may take 1/2 tablet before a meal with dessert or fried food.  Check sugars 1x a day.  Please return in 3 months with your sugar log.   PATIENT INSTRUCTIONS FOR TYPE 2 DIABETES:  DIET AND EXERCISE Diet and exercise is an important part of diabetic treatment.  We recommended aerobic exercise in the form of brisk walking (working between 40-60% of maximal aerobic capacity, similar to brisk walking) for 150 minutes per week (such as 30 minutes five days per week) along with 3 times per week performing 'resistance' training (using various gauge rubber tubes with handles) 5-10 exercises involving the major muscle groups (upper body, lower body and core) performing 10-15 repetitions (or near fatigue) each exercise. Start at half the above goal but build slowly to reach the above goals. If limited by weight, joint pain, or disability, we recommend daily walking in a swimming pool with water up to waist to reduce pressure from joints while allow for adequate exercise.    BLOOD GLUCOSES Monitoring your blood glucoses is important for continued management of your diabetes. Please check your blood glucoses 2-4 times a day: fasting, before meals and at bedtime (you can rotate these measurements - e.g. one day check before the 3 meals, the next day check before 2 of the meals and before bedtime, etc.).   HYPOGLYCEMIA (low blood sugar) Hypoglycemia is usually a reaction to not eating, exercising, or taking too much insulin/ other diabetes drugs.  Symptoms include tremors, sweating, hunger, confusion, headache, etc. Treat IMMEDIATELY with 15 grams of Carbs: . 4 glucose tablets .  cup regular juice/soda . 2 tablespoons raisins . 4 teaspoons sugar . 1 tablespoon honey Recheck blood glucose in 15 mins and repeat above if still symptomatic/blood glucose <100.  RECOMMENDATIONS TO REDUCE YOUR RISK OF DIABETIC COMPLICATIONS: * Take your  prescribed MEDICATION(S) * Follow a DIABETIC diet: Complex carbs, fiber rich foods, (monounsaturated and polyunsaturated) fats * AVOID saturated/trans fats, high fat foods, >2,300 mg salt per day. * EXERCISE at least 5 times a week for 30 minutes or preferably daily.  * DO NOT SMOKE OR DRINK more than 1 drink a day. * Check your FEET every day. Do not wear tightfitting shoes. Contact us if you develop an ulcer * See your EYE doctor once a year or more if needed * Get a FLU shot once a year * Get a PNEUMONIA vaccine once before and once after age 43 years  GOALS:  * Your Hemoglobin A1c of <7%  * fasting sugars need to be <130 * after meals sugars need to be <180 (2h after you start eating) * Your Systolic BP should be [id number] or lower  * Your Diastolic BP should be 80 or lower  * Your HDL (Good Cholesterol) should be 40 or higher  * Your LDL (Bad Cholesterol) should be 100 or lower. * Your Triglycerides should be 150 or lower  * Your Urine microalbumin (kidney function) should be <30 * Your Body Mass Index should be 25 or lower    Please consider the following ways to cut down carbs and fat and increase fiber and micronutrients in your diet: - substitute whole grain for white bread or pasta - substitute brown rice for white rice - substitute 90-calorie flat bread pieces for slices of bread when possible - substitute sweet potatoes or yams for white potatoes - substitute humus for margarine - substitute tofu for cheese when possible -  substitute almond or rice milk for regular milk (would not drink soy milk daily due to concern for soy estrogen influence on breast cancer risk) - substitute dark chocolate for other sweets when possible - substitute water - can add lemon or orange slices for taste - for diet sodas (artificial sweeteners will trick your body that you can eat sweets without getting calories and will lead you to overeating and weight gain in the long run) - do not skip  breakfast or other meals (this will slow down the metabolism and will result in more weight gain over time)  - can try smoothies made from fruit and almond/rice milk in am instead of regular breakfast - can also try old-fashioned (not instant) oatmeal made with almond/rice milk in am - order the dressing on the side when eating salad at a restaurant (pour less than half of the dressing on the salad) - eat as little meat as possible - can try juicing, but should not forget that juicing will get rid of the fiber, so would alternate with eating raw veg./fruits or drinking smoothies - use as little oil as possible, even when using olive oil - can dress a salad with a mix of balsamic vinegar and lemon juice, for e.g. - use agave nectar, stevia sugar, or regular sugar rather than artificial sweateners - steam or broil/roast veggies  - snack on veggies/fruit/nuts (unsalted, preferably) when possible, rather than processed foods - reduce or eliminate aspartame in diet (it is in diet sodas, chewing gum, etc) Read the labels!  Try to read Dr. Janene Harvey book: "Program for Reversing Diabetes" for other ideas for healthy eating.

## 2019-09-18 ENCOUNTER — Other Ambulatory Visit: Payer: Self-pay

## 2019-09-18 ENCOUNTER — Ambulatory Visit (HOSPITAL_COMMUNITY)
Admission: RE | Admit: 2019-09-18 | Discharge: 2019-09-18 | Disposition: A | Discharge status: Home / Self Care | Payer: Medicare Other | Source: Ambulatory Visit | Attending: Cardiology | Admitting: Cardiology

## 2019-09-18 ENCOUNTER — Encounter (HOSPITAL_COMMUNITY): Payer: Self-pay

## 2019-09-18 VITALS — BP 120/80 | HR 77 | Wt 196.4 lb

## 2019-09-18 DIAGNOSIS — Z8249 Family history of ischemic heart disease and other diseases of the circulatory system: Secondary | ICD-10-CM | POA: Insufficient documentation

## 2019-09-18 DIAGNOSIS — E785 Hyperlipidemia, unspecified: Secondary | ICD-10-CM | POA: Diagnosis not present

## 2019-09-18 DIAGNOSIS — I130 Hypertensive heart and chronic kidney disease with heart failure and stage 1 through stage 4 chronic kidney disease, or unspecified chronic kidney disease: Secondary | ICD-10-CM | POA: Insufficient documentation

## 2019-09-18 DIAGNOSIS — G4700 Insomnia, unspecified: Secondary | ICD-10-CM | POA: Diagnosis not present

## 2019-09-18 DIAGNOSIS — G4733 Obstructive sleep apnea (adult) (pediatric): Secondary | ICD-10-CM | POA: Diagnosis not present

## 2019-09-18 DIAGNOSIS — Z7901 Long term (current) use of anticoagulants: Secondary | ICD-10-CM | POA: Insufficient documentation

## 2019-09-18 DIAGNOSIS — I5042 Chronic combined systolic (congestive) and diastolic (congestive) heart failure: Secondary | ICD-10-CM | POA: Diagnosis not present

## 2019-09-18 DIAGNOSIS — I5022 Chronic systolic (congestive) heart failure: Secondary | ICD-10-CM | POA: Diagnosis not present

## 2019-09-18 DIAGNOSIS — E669 Obesity, unspecified: Secondary | ICD-10-CM | POA: Insufficient documentation

## 2019-09-18 DIAGNOSIS — I5082 Biventricular heart failure: Secondary | ICD-10-CM | POA: Insufficient documentation

## 2019-09-18 DIAGNOSIS — Z833 Family history of diabetes mellitus: Secondary | ICD-10-CM | POA: Diagnosis not present

## 2019-09-18 DIAGNOSIS — Z882 Allergy status to sulfonamides status: Secondary | ICD-10-CM | POA: Insufficient documentation

## 2019-09-18 DIAGNOSIS — N1832 Chronic kidney disease, stage 3b: Secondary | ICD-10-CM | POA: Diagnosis not present

## 2019-09-18 DIAGNOSIS — Z79899 Other long term (current) drug therapy: Secondary | ICD-10-CM | POA: Diagnosis not present

## 2019-09-18 DIAGNOSIS — N400 Enlarged prostate without lower urinary tract symptoms: Secondary | ICD-10-CM | POA: Diagnosis not present

## 2019-09-18 DIAGNOSIS — R000 Tachycardia, unspecified: Secondary | ICD-10-CM | POA: Insufficient documentation

## 2019-09-18 DIAGNOSIS — Z87891 Personal history of nicotine dependence: Secondary | ICD-10-CM | POA: Diagnosis not present

## 2019-09-18 DIAGNOSIS — Z87442 Personal history of urinary calculi: Secondary | ICD-10-CM | POA: Insufficient documentation

## 2019-09-18 DIAGNOSIS — E1122 Type 2 diabetes mellitus with diabetic chronic kidney disease: Secondary | ICD-10-CM | POA: Insufficient documentation

## 2019-09-18 DIAGNOSIS — I4819 Other persistent atrial fibrillation: Secondary | ICD-10-CM | POA: Insufficient documentation

## 2019-09-18 LAB — BASIC METABOLIC PANEL
Anion gap: 9 (ref 5–15)
BUN: 33 mg/dL — ABNORMAL HIGH (ref 8–23)
CO2: 27 mmol/L (ref 22–32)
Calcium: 9.3 mg/dL (ref 8.9–10.3)
Chloride: 103 mmol/L (ref 98–111)
Creatinine, Ser: 2.15 mg/dL — ABNORMAL HIGH (ref 0.61–1.24)
GFR calc Af Amer: 36 mL/min — ABNORMAL LOW (ref >60)
GFR calc non Af Amer: 31 mL/min — ABNORMAL LOW (ref >60)
Glucose, Bld: 131 mg/dL — ABNORMAL HIGH (ref 70–99)
Potassium: 4.7 mmol/L (ref 3.5–5.1)
Sodium: 139 mmol/L (ref 135–145)

## 2019-09-18 LAB — CBC
HCT: 36.9 % — ABNORMAL LOW (ref 39.0–52.0)
Hemoglobin: 10.9 g/dL — ABNORMAL LOW (ref 13.0–17.0)
MCH: 25.6 pg — ABNORMAL LOW (ref 26.0–34.0)
MCHC: 29.5 g/dL — ABNORMAL LOW (ref 30.0–36.0)
MCV: 86.6 fL (ref 80.0–100.0)
Platelets: 194 10*3/uL (ref 150–400)
RBC: 4.26 MIL/uL (ref 4.22–5.81)
RDW: 17.1 % — ABNORMAL HIGH (ref 11.5–15.5)
WBC: 3.7 10*3/uL — ABNORMAL LOW (ref 4.0–10.5)
nRBC: 0 % (ref 0.0–0.2)

## 2019-09-18 LAB — BRAIN NATRIURETIC PEPTIDE: B Natriuretic Peptide: 373.1 pg/mL — ABNORMAL HIGH (ref 0.0–100.0)

## 2019-09-18 NOTE — Patient Instructions (Signed)
Labs done today, your results will be available in MyChart, we will contact you for abnormal readings.  We will call you later today or tomorrow if we need to make any changes to your medications based on your lab work  Keep follow up as scheduled in June  If you have any questions or concerns before your next appointment please send [place] a message through [place] or call our office at [phone].  At the [facility], you and your health needs are our priority. As part of our continuing mission to provide you with exceptional heart care, we have created designated Provider Care Teams. These Care Teams include your primary Cardiologist (physician) and Advanced Practice Providers (APPs- Physician Assistants and Nurse Practitioners) who all work together to provide you with the care you need, when you need it.   You may see any of the following providers on your designated Care Team at your next follow up: [name] Dr [name] . Dr Loralie Champagne . Darrick Grinder, NP . Lyda Jester, PA . Audry Riles, PharmD   Please be sure to bring in all your medications bottles to every appointment.

## 2019-09-18 NOTE — Progress Notes (Signed)
ReDS Vest / Clip - 09/18/19 1400      ReDS Vest / Clip   Station Marker  C    Ruler Value  28    ReDS Value Range  (!) High volume overload    ReDS Actual Value  56    Anatomical Comments  sitting

## 2019-09-18 NOTE — Progress Notes (Signed)
Advanced Heart Failure Clinic Note   Referring Physician: PCP: [name], MD PCP-Cardiologist: Sanda Klein, MD  [facility]: Dr. 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  Admitted 05/16/19 for PVI with successful procedure. Post procedure he developed cardiogenic shock and respiratory failure. CCM consulted for intubation. Advanced HF team consulted. As he improved he was extubated.  Treated with pressors and gradually weaned off. HF meds initiated but unable to treat w/ ARB/ARNI nor spiro due to AKI, SCr peaking to 2.2. Tolerated Bidil and digoxin. ECHO initially showed EF 10% but improved 35-40% prior to discharge. Was discharged home on 05/29/19.   Had Linq placed 05/15/20 part of the ALLEIVATE HF Trial.   He went back in A fib 08/02/19. Saw pulmonary on 08/02/19 and was noted to have many central events. CPAP was to be changed to auto titration.  On 08/16/19 he had successful cardioversion for afib.   Had recent clinic f/u on 3/25 and was volume overloaded and symptomatic, NYHA IIIb. ReDs clip was 52%. Lasix was changed from 3x/week to daily.   He presents back for f/u. Feels better. Exertional dyspnea resolved. No resting dyspnea. Denies orthopnea/PND. Compliant w/ BiPAP at night.  No LEE. BP well controlled. Feels like his energy level has improved w/ diuretic increase. No major complaints today. He is in NSR by EKG today. Despite marked improvement in symptoms, ReDs reading still high at 56%.   . Past Medical History:  Diagnosis Date  . AORTIC STENOSIS, MODERATE 12/05/2009  . Atrial fibrillation ([place])   . Atrial fibrillation with RVR ([place]) 07/05/2017  . Atrial flutter ([place]) 12/05/2009  . Atypical chest pain 11/08/2011  . BENIGN PROSTATIC HYPERTROPHY, HX OF 12/05/2009  . CHEST PAIN-UNSPECIFIED 11/11/2009  . CHF 12/05/2009  . Chronic renal insufficiency 12/16/2016  . Debility 04/18/2012  . Diarrhea  12/16/2016  . DYSPNEA 12/19/2009  . Edema 04/18/2012  . ERECTILE DYSFUNCTION 01/25/2007  . FATIGUE, CHRONIC 11/11/2009  . Gout 07/10/2012  . Heart murmur   . History of kidney stones    "they passed" (07/05/2017)  . Hyperkalemia 04/18/2012  . Hyperlipidemia   . Hypertension   . HYPERTENSION 01/25/2007  . Hypoxia 05/05/2011  . Insomnia 05/13/2016  . INSOMNIA, HX OF 01/25/2007  . Knee pain, bilateral 12/28/2010  . Knee pain, right 12/28/2010  . NEUROMA 01/05/2010  . OBESITY NOS 01/25/2007  . OSA on CPAP 04/06/2010  . PERIPHERAL NEUROPATHY 01/05/2010  . PULMONARY FUNCTION TESTS, ABNORMAL 02/02/2010  . Superficial thrombophlebitis of left leg 09/07/2011  . TESTICULAR HYPOFUNCTION 01/05/2010  . 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 Toe pain, right 07/10/2012  . Type II diabetes mellitus ([place]) 12/05/2009  . UNSPECIFIED ANEMIA 01/05/2010    Current Outpatient Medications  Medication Sig Dispense Refill  . albuterol (VENTOLIN HFA) 108 (90 Base) MCG/ACT inhaler Inhale 2 puffs into the lungs every 6 (six) hours as needed for wheezing or shortness of breath. 1 Inhaler 6  . allopurinol (ZYLOPRIM) 300 MG tablet Take 1 tablet by mouth once daily 90 tablet 0  . amiodarone (PACERONE) 200 MG tablet Take 1 tablet (200 mg total) by mouth daily. 90 tablet 1  . Blood Glucose Monitoring Suppl (ONETOUCH VERIO) w/Device KIT Use to check blood sugar TID as directed.  Dx Code: E11.9 1 kit 0  . cetirizine (ZYRTEC) 10 MG tablet Take 1 tablet (10 mg total) by mouth daily. [place]  tablet 11  . Clobetasol Prop Emollient Base (CLOBETASOL PROPIONATE E) 0.05 % emollient cream Apply 1 application topically 2 (two) times daily as needed (insect bite). 60 g 1  . colchicine (COLCRYS) 0.6 MG tablet TAKE ONE TABLET BY MOUTH DAILY AS NEEDED FOR GOUT FLARE UPS. 30 tablet 0  . ELIQUIS 5 MG TABS tablet Take 1 tablet by mouth twice daily 60 tablet 10  . fish oil-omega-3 fatty acids 1000 MG capsule Take 1 g by mouth daily.     . furosemide (LASIX) 80 MG tablet  Take 1 tablet (80 mg total) by mouth daily. And 1 tab as needed for swelling or shortness of breath 30 tablet 6  . glucose blood test strip (Verio) Use as instructed Check blood sugars TID DxE11.9 100 each 12  . hydrALAZINE (APRESOLINE) 25 MG tablet Take 1 tablet (25 mg total) by mouth 3 (three) times daily. 90 tablet 6  . HYDROcodone-acetaminophen (NORCO) 10-325 MG tablet Take 1 tablet by mouth every 8 (eight) hours as needed for moderate pain or severe pain. 90 tablet 0  . isosorbide mononitrate (IMDUR) 30 MG 24 hr tablet Take 1 tablet (30 mg total) by mouth daily. 30 tablet 11  . LORazepam (ATIVAN) 1 MG tablet Take 1 tablet (1 mg total) by mouth every 8 (eight) hours as needed for anxiety. 30 tablet 5  . magnesium oxide (MAG-OX) 400 MG tablet Take 1 tablet (400 mg total) by mouth 2 (two) times daily. 30 tablet 11  . metoprolol succinate (TOPROL-XL) 25 MG 24 hr tablet Take 0.5 tablets (12.5 mg total) by mouth 2 (two) times daily. 90 tablet 3  . Multiple Vitamin (MULTIVITAMIN WITH MINERALS) TABS tablet Take 1 tablet by mouth daily.     . naproxen (NAPROSYN) 375 MG tablet Take 375 mg by mouth 2 (two) times daily as needed (knee pain.).     [name] potassium chloride SA (K-DUR,KLOR-CON) 20 MEQ tablet TAKE 1 TABLET BY MOUTH ONCE DAILY AND  EXTRA  TABLET  IF  WEIGHT  IS  ABOVE  210  LBS 180 tablet 2  . pravastatin (PRAVACHOL) 40 MG tablet Take 1 tablet by mouth once daily 90 tablet 3  . tamsulosin (FLOMAX) 0.4 MG CAPS capsule Take 1 capsule by mouth once daily 30 capsule 0  . temazepam (RESTORIL) 30 MG capsule Take 1 capsule (30 mg total) by mouth at bedtime. 30 capsule 5   No current facility-administered medications for this encounter.    Allergies  Allergen Reactions  . Sulfonamide Derivatives Other (See Comments)    unknown reaction.  Patient states had to go to the ER.   [name] Zolpidem Other (See Comments)    Excessive, prolonged sedation      Social History   Socioeconomic History  . Marital  status: Married    Spouse name: Not on file  . Number of children: 4  . Years of education: 38  . Highest education level: High school graduate  Occupational History  . Occupation: retired    Fish farm manager: [employer]  Tobacco Use  . Smoking status: Former Smoker    Packs/day: 1.00    Years: 10.00    Pack years: 10.00    Quit date: 06/07/1980    Years since quitting: 39.3  . Smokeless tobacco: Never Used  Substance and Sexual Activity  . Alcohol use: No  . Drug use: No  . Sexual activity: Not Currently  Other Topics Concern  . Not on file  Social History Narrative  Lives with male partner in a one story home.  His son lives there off and on.  Retired from [employer].  Education: high school.  Right handed   Social Determinants of Health   Financial Resource Strain:   . Difficulty of Paying Living Expenses:   Food Insecurity:   . Worried About Charity fundraiser in the Last Year:   . Arboriculturist in the Last Year:   Transportation Needs:   . Film/video editor (Medical):   [name] Lack of Transportation (Non-Medical):   Physical Activity:   . Days of Exercise per Week:   . Minutes of Exercise per Session:   Stress:   . Feeling of Stress :   Social Connections:   . Frequency of Communication with Friends and Family:   . Frequency of Social Gatherings with Friends and Family:   . Attends Religious Services:   . Active Member of Clubs or Organizations:   . Attends Archivist Meetings:   [name] Marital Status:   Intimate Partner Violence:   . Fear of Current or Ex-Partner:   . Emotionally Abused:   [name] Physically Abused:   . Sexually Abused:       Family History  Problem Relation Age of Onset  . Clotting disorder Mother   . Heart disease Mother        s/p MI  . Heart attack Mother   . Hypertension Mother   . Diabetes Mother   . Hyperlipidemia Mother   . Stroke Mother   . Cancer Father        ? lung  . Lung disease Father        smoker  . Diabetes Sister    . Hypertension Sister        smoker  . Leukemia Maternal Grandmother        ?  [name] Aneurysm Sister        brain  . Other Sister        clipped  . Seizures Sister        d/o w/aneurysm/ smoker    Vitals:   09/18/19 1409  BP: 120/80  Pulse: 77  SpO2: 97%  Weight: 89.1 kg (196 lb 6.4 oz)    Wt Readings from Last 3 Encounters:  09/18/19 89.1 kg (196 lb 6.4 oz)  09/14/19 88.9 kg (196 lb)  08/30/19 88.6 kg (195 lb 6 oz)    PHYSICAL EXAM: General:  Well appearing. No respiratory difficulty HEENT: normal Neck: supple. no JVD. Carotids 2+ bilat; no bruits. No lymphadenopathy or thyromegaly appreciated. Cor: PMI nondisplaced. Regular rate & rhythm. 3/6 murmur at LSB and apex Lungs: clear Abdomen: soft, nontender, nondistended. No hepatosplenomegaly. No bruits or masses. Good bowel sounds. Extremities: no cyanosis, clubbing, rash, edema Neuro: alert & oriented x 3, cranial nerves grossly intact. moves all 4 extremities w/o difficulty. Affect pleasant.   EKG: NSR w/ PVCs 80 bpm  ASSESSMENT & PLAN: 1. 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  EF 10% by TEE 12/9 likely due to tachy CM Initially on pressors after PVI.  - Limited ECHO completed on 05/28/19 and showed EF improving with SR, 40-45%.  - ECHO 07/20/19: EF 35-40%  - Had Linq  placement. - In ALLEVIATE HF study in the medical arm.  - Reds Clip 56%. However reading does not match symptoms and the  rest of his exam. ? If accurate. He feels markedly better w/ change in lasix to daily use. Functional status has improved from IIIb>>II. Does not appear grossly volume overloaded on exam. Will check BNP to better evaluate. Based on result, will decide if we should try to push diuretics a bit further. - For now, continue Lasix 80 mg daily. Check BMP today  - GDMT limited by CKD. No ARB/ ARNi, spiro nor digoxin -  Continue Toprol 12.5 bid  - Continue Hydralazine + Imdur  - With advanced renal disease, we have been hesitant to titrate meds much further given soft BP   2. Persistent AF - s/p successful PVI 12/9. Has LINQ - Tikosyn off due to AKI -S/P Cardioversion on 08/16/19 with conversion to NSR - Maintaining NSR w/ few PVCs - Continue amiodarone to 200 mg daily - Continue Eliquis. Denies abnormal bleeding - Continue Toprol XL   3. CKD 3b - Most recent creatinine was up to 2.10 - Will repeat BMP today   4. OSA - Compliant w/ BiPAP  F/u w/ Dr. Haroldine Laws in 6 weeks, sooner w/APP if we end up further titrating his lasix.    Lyda Jester, PA-C 09/18/19

## 2019-09-20 ENCOUNTER — Ambulatory Visit (INDEPENDENT_AMBULATORY_CARE_PROVIDER_SITE_OTHER): Payer: Medicare Other | Admitting: Cardiovascular Disease

## 2019-09-20 ENCOUNTER — Encounter: Payer: Self-pay | Admitting: Family Medicine

## 2019-09-20 ENCOUNTER — Ambulatory Visit (INDEPENDENT_AMBULATORY_CARE_PROVIDER_SITE_OTHER): Payer: Medicare Other | Admitting: Family Medicine

## 2019-09-20 ENCOUNTER — Other Ambulatory Visit: Payer: Self-pay

## 2019-09-20 ENCOUNTER — Encounter: Payer: Self-pay | Admitting: Cardiovascular Disease

## 2019-09-20 VITALS — BP 114/68 | HR 78 | Ht 71.0 in | Wt 195.6 lb

## 2019-09-20 DIAGNOSIS — N1832 Chronic kidney disease, stage 3b: Secondary | ICD-10-CM | POA: Diagnosis not present

## 2019-09-20 DIAGNOSIS — E669 Obesity, unspecified: Secondary | ICD-10-CM | POA: Diagnosis not present

## 2019-09-20 DIAGNOSIS — E782 Mixed hyperlipidemia: Secondary | ICD-10-CM

## 2019-09-20 DIAGNOSIS — Z95818 Presence of other cardiac implants and grafts: Secondary | ICD-10-CM | POA: Diagnosis not present

## 2019-09-20 DIAGNOSIS — Z5181 Encounter for therapeutic drug level monitoring: Secondary | ICD-10-CM

## 2019-09-20 DIAGNOSIS — I480 Paroxysmal atrial fibrillation: Secondary | ICD-10-CM

## 2019-09-20 DIAGNOSIS — E663 Overweight: Secondary | ICD-10-CM

## 2019-09-20 DIAGNOSIS — I10 Essential (primary) hypertension: Secondary | ICD-10-CM

## 2019-09-20 DIAGNOSIS — G4733 Obstructive sleep apnea (adult) (pediatric): Secondary | ICD-10-CM

## 2019-09-20 DIAGNOSIS — Z79899 Other long term (current) drug therapy: Secondary | ICD-10-CM

## 2019-09-20 DIAGNOSIS — I5042 Chronic combined systolic (congestive) and diastolic (congestive) heart failure: Secondary | ICD-10-CM | POA: Diagnosis not present

## 2019-09-20 DIAGNOSIS — L309 Dermatitis, unspecified: Secondary | ICD-10-CM

## 2019-09-20 DIAGNOSIS — Z8639 Personal history of other endocrine, nutritional and metabolic disease: Secondary | ICD-10-CM

## 2019-09-20 DIAGNOSIS — I351 Nonrheumatic aortic (valve) insufficiency: Secondary | ICD-10-CM | POA: Diagnosis not present

## 2019-09-20 DIAGNOSIS — E1169 Type 2 diabetes mellitus with other specified complication: Secondary | ICD-10-CM

## 2019-09-20 DIAGNOSIS — Z7901 Long term (current) use of anticoagulants: Secondary | ICD-10-CM | POA: Diagnosis not present

## 2019-09-20 MED ORDER — HYDROCORTISONE 2.5 % EX OINT: TOPICAL_OINTMENT | Freq: Two times a day (BID) | CUTANEOUS | 1 refills | Status: AC | PRN

## 2019-09-20 NOTE — Assessment & Plan Note (Signed)
Well controlled, no changes to meds. Encouraged heart healthy diet such as the DASH diet and exercise as tolerated.  °

## 2019-09-20 NOTE — Assessment & Plan Note (Signed)
Has had a cardioversion with last visit with Dr C and he tolerated it well

## 2019-09-20 NOTE — Assessment & Plan Note (Signed)
hgba1c acceptable, minimize simple carbs. Increase exercise as tolerated. Continue current meds 

## 2019-09-20 NOTE — Progress Notes (Signed)
Cardiology Office Note    Date:  09/22/2019   ID:  Verlon, Pischke 1950-11-24, MRN [id number]  PCP:  [name], MD  Cardiologist:   Sanda Klein, MD   Chief Complaint  Patient presents with  . Congestive Heart Failure  . Atrial Flutter    History of Present Illness:  [name] is a 69 y.o. male with aortic insufficiency, combined systolic and diastolic heart failure, paroxysmal atrial fibrillation and atrial flutter, obstructive sleep apnea, essential hypertension dyslipidemia in the setting of obesity and type 2 diabetes mellitus.  He returns in follow-up after undergoing cardioversion on March 11.  He has been steadily improving.  He rides a stationary bike at the minimum setting for 5 minutes a day and is slowly trying to increase the duration of exercise.  He does not have orthopnea or PND or lower extremity edema and has not experienced dizziness or syncope.  He has never been aware of palpitations with his arrhythmia.    He is compliant with anticoagulation with Eliquis and has not had falls injuries or bleeding problems.  He reports 100% compliance with CPAP.  His loop recorder has shown some brief episodes of recurrent arrhythmia since the cardioversion, most likely atypical atrial flutter, but the longest and most recent episode on April 1 only lasted for 12 minutes.  It was not fast.  Follow-up echocardiogram performed on July 20, 2019 showed EF back to "baseline" 35-40%.  The aortic insufficiency was described as mild to moderate, ascending aorta 43 mm.  The left atrium was severely dilated.  The left ventricle is normal in size with mild concentric hypertrophy.  He continues to work hard on his diet and has lost weight and is no longer obese.  His most recent hemoglobin A1c was down to 5.3% and he is no longer taking any medications for diabetes.  His most recent lipid profile showed an LDL cholesterol of 76 and HDL of 41, triglycerides 76.  He has  not had any overt side effects from amiodarone.  TSH and liver function tests were normal in the last 2-3 months.  He has moderate chronic kidney disease with a very recent creatinine stable at 2.1.  He was very ill at the end of 2020 with severe tachycardia related cardiomyopathy, severely depressed left ventricular systolic function in the setting of atrial flutter with rapid ventricular rates.  He underwent ablation with Will Camnitz, transition from dofetilide to amiodarone and then was hospitalized for optimization of his heart failure.  A loop recorder was implanted as part of a CHF trial.  He has never really been aware of the palpitations, so episodes of breakthrough arrhythmia have often gone undetected in the past.  Because of a 2-week episode of persistent atrial fibrillation detected by his loop recorder, he underwent cardioversion successfully on March 11.  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  Most recently in December 2020 he had cardiogenic shock and an EF of 10%, underwent pulmonary vein isolation and had a prolonged hospitalization on pressors.  Follow-up echo in February 2021 showed EF of 35-40%.  Prior to that in January 2019 his ejection fraction dropped to 30-35% when he was again unaware of the persistent arrhythmia.  He underwent cardioversion and ranolazine was added to dofetilide with a return to sinus rhythm, improved functional status and improvement in left ventricular ejection fraction to 42% by echo performed in April 2019  In early 2020 he had an  upper respiratory infection from which he never felt that he fully rebounded.  He noticed persistent tachycardia throughout the summer 2020 he underwent successful cardioversion on February 27, 2019 but had early recurrence of atrial fibrillation despite increased dose of dofetilide.  His clinical status steadily deteriorated.  On May 16, 2019 TEE showed LVEF was down to 10%, with  severe biatrial dilation and moderate aortic insufficiency, mild mitral insufficiency.  The same day he underwent A. fib/a flutter ablation by Dr. Curt Bears and a right heart catheterization by Dr. Haroldine Laws (RA pressure 15, PA pressure 49/32, wedge pressure mean 27, cardiac index 2.8).  Follow-up echocardiogram in February 2021 showed EF of 35-40%.  He did have another cardioversion in early March for persistent atrial flutter.  His echocardiograms have shown moderate aortic insufficiency related to aortic ectasia.  He has a long-standing history of severe hypertension. He also has long-standing history of obstructive sleep apnea which was also not treated until recently, but he is now 100% CPAP-compliant. Coronary angiography 2011 did not show evidence of significant stenoses. He presented with typical atrial flutter in 2011 and has recurrent paroxysmal atrial fibrillation with a good response to treatment with dofetilide, but the dose of dofetilide was decreased when his renal function deteriorated in 2020. Additional problems include obesity, type 2 diabetes mellitus, gout and androgen deficiency. He had a successful cardioversion in October 2016 and February 2019 for persistent atrial fibrillation.   Past Medical History:  Diagnosis Date  . AORTIC STENOSIS, MODERATE 12/05/2009  . Atrial fibrillation ([place])   . Atrial fibrillation with RVR ([place]) 07/05/2017  . Atrial flutter ([place]) 12/05/2009  . Atypical chest pain 11/08/2011  . BENIGN PROSTATIC HYPERTROPHY, HX OF 12/05/2009  . CHEST PAIN-UNSPECIFIED 11/11/2009  . CHF 12/05/2009  . Chronic renal insufficiency 12/16/2016  . Debility 04/18/2012  . Diarrhea 12/16/2016  . DYSPNEA 12/19/2009  . Edema 04/18/2012  . ERECTILE DYSFUNCTION 01/25/2007  . FATIGUE, CHRONIC 11/11/2009  . Gout 07/10/2012  . Heart murmur   . History of kidney stones    "they passed" (07/05/2017)  . Hyperkalemia 04/18/2012  . Hyperlipidemia   . Hypertension   . HYPERTENSION 01/25/2007  .  Hypoxia 05/05/2011  . Insomnia 05/13/2016  . INSOMNIA, HX OF 01/25/2007  . Knee pain, bilateral 12/28/2010  . Knee pain, right 12/28/2010  . NEUROMA 01/05/2010  . OBESITY NOS 01/25/2007  . OSA on CPAP 04/06/2010  . PERIPHERAL NEUROPATHY 01/05/2010  . PULMONARY FUNCTION TESTS, ABNORMAL 02/02/2010  . Superficial thrombophlebitis of left leg 09/07/2011  . TESTICULAR HYPOFUNCTION 01/05/2010  . 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 Toe pain, right 07/10/2012  . Type II diabetes mellitus ([place]) 12/05/2009  . UNSPECIFIED ANEMIA 01/05/2010    Past Surgical History:  Procedure Laterality Date  . A-FLUTTER ABLATION N/A 05/16/2019   Procedure: A-FLUTTER ABLATION;  Surgeon: Constance Haw, MD;  Location: [facility];  Service: Cardiovascular;  Laterality: N/A;  . ATRIAL FIBRILLATION ABLATION N/A 05/16/2019   Procedure: ATRIAL FIBRILLATION ABLATION;  Surgeon: Constance Haw, MD;  Location: [facility];  Service: Cardiovascular;  Laterality: N/A;  . CARDIAC CATHETERIZATION  10/16/2009   nonischemic cardiomyopathy  . CARDIOVERSION N/A 03/25/2015   Procedure: CARDIOVERSION;  Surgeon: Pixie Casino, MD;  Location: [facility] ENDOSCOPY;  Service: Cardiovascular;  Laterality: N/A;  . CARDIOVERSION N/A 06/17/2017   Procedure: CARDIOVERSION;  Surgeon: Sanda Klein, MD;  Location: [facility] ENDOSCOPY;  Service: Cardiovascular;  Laterality: N/A;  . CARDIOVERSION N/A 07/08/2017   Procedure: CARDIOVERSION;  Surgeon: Fay Records, MD;  Location: [facility] ENDOSCOPY;  Service: Cardiovascular;  Laterality: N/A;  . CARDIOVERSION N/A 02/27/2019   Procedure: CARDIOVERSION;  Surgeon: Sanda Klein, MD;  Location: [place] [facility] ENDOSCOPY;  Service: Cardiovascular;  Laterality: N/A;  . CARDIOVERSION N/A 08/16/2019   Procedure: CARDIOVERSION;  Surgeon: Sanda Klein, MD;  Location: [facility] ENDOSCOPY;  Service: Cardiovascular;  Laterality: N/A;  . METATARSAL OSTEOTOMY Bilateral ~ 1980   removed part of 5th metatarsal to corect curvature of toes   . RIGHT  HEART CATH N/A 05/16/2019   Procedure: RIGHT HEART CATH;  Surgeon: Jolaine Artist, MD;  Location: [facility];  Service: Cardiovascular;  Laterality: N/A;  . TEE WITHOUT CARDIOVERSION  05/16/2019   Procedure: Transesophageal Echocardiogram (Tee);  Surgeon: Constance Haw, MD;  Location: [facility];  Service: Cardiovascular;;    Current Medications: Outpatient Medications Prior to Visit  Medication Sig Dispense Refill  . albuterol (VENTOLIN HFA) 108 (90 Base) MCG/ACT inhaler Inhale 2 puffs into the lungs every 6 (six) hours as needed for wheezing or shortness of breath. 1 Inhaler 6  . allopurinol (ZYLOPRIM) 300 MG tablet Take 1 tablet by mouth once daily 90 tablet 0  . amiodarone (PACERONE) 200 MG tablet Take 1 tablet (200 mg total) by mouth daily. 90 tablet 1  . Blood Glucose Monitoring Suppl (ONETOUCH VERIO) w/Device KIT Use to check blood sugar TID as directed.  Dx Code: E11.9 1 kit 0  . cetirizine (ZYRTEC) 10 MG tablet Take 1 tablet (10 mg total) by mouth daily. 30 tablet 11  . Clobetasol Prop Emollient Base (CLOBETASOL PROPIONATE E) 0.05 % emollient cream Apply 1 application topically 2 (two) times daily as needed (insect bite). 60 g 1  . colchicine (COLCRYS) 0.6 MG tablet TAKE ONE TABLET BY MOUTH DAILY AS NEEDED FOR GOUT FLARE UPS. 30 tablet 0  . ELIQUIS 5 MG TABS tablet Take 1 tablet by mouth twice daily 60 tablet 10  . fish oil-omega-3 fatty acids 1000 MG capsule Take 1 g by mouth daily.     . furosemide (LASIX) 80 MG tablet Take 1 tablet (80 mg total) by mouth daily. And 1 tab as needed for swelling or shortness of breath 30 tablet 6  . glucose blood test strip (Verio) Use as instructed Check blood sugars TID DxE11.9 100 each 12  . hydrALAZINE (APRESOLINE) 25 MG tablet Take 1 tablet (25 mg total) by mouth 3 (three) times daily. 90 tablet 6  . HYDROcodone-acetaminophen (NORCO) 10-325 MG tablet Take 1 tablet by mouth every 8 (eight) hours as needed for moderate pain  or severe pain. 90 tablet 0  . hydrocortisone 2.5 % ointment Apply topically 2 (two) times daily.    . hydrocortisone 2.5 % ointment Apply topically 2 (two) times daily as needed. 30 g 1  . isosorbide mononitrate (IMDUR) 30 MG 24 hr tablet Take 1 tablet (30 mg total) by mouth daily. 30 tablet 11  . LORazepam (ATIVAN) 1 MG tablet Take 1 tablet (1 mg total) by mouth every 8 (eight) hours as needed for anxiety. 30 tablet 5  . magnesium oxide (MAG-OX) 400 MG tablet Take 1 tablet (400 mg total) by mouth 2 (two) times daily. 30 tablet 11  . metoprolol succinate (TOPROL-XL) 25 MG 24 hr tablet Take 0.5 tablets (12.5 mg total) by mouth 2 (two) times daily. 90 tablet 3  . Multiple Vitamin (MULTIVITAMIN WITH MINERALS) TABS tablet Take 1 tablet by mouth daily.     . naproxen (  NAPROSYN) 375 MG tablet Take 375 mg by mouth 2 (two) times daily as needed (knee pain.).     [name] potassium chloride SA (K-DUR,KLOR-CON) 20 MEQ tablet TAKE 1 TABLET BY MOUTH ONCE DAILY AND  EXTRA  TABLET  IF  WEIGHT  IS  ABOVE  210  LBS 180 tablet 2  . pravastatin (PRAVACHOL) 40 MG tablet Take 1 tablet by mouth once daily 90 tablet 3  . tamsulosin (FLOMAX) 0.4 MG CAPS capsule Take 1 capsule by mouth once daily 30 capsule 0  . temazepam (RESTORIL) 30 MG capsule Take 1 capsule (30 mg total) by mouth at bedtime. 30 capsule 5   No facility-administered medications prior to visit.     Allergies:   Sulfonamide derivatives and Zolpidem   Social History   Socioeconomic History  . Marital status: Married    Spouse name: Not on file  . Number of children: 4  . Years of education: 28  . Highest education level: High school graduate  Occupational History  . Occupation: retired    Fish farm manager: [employer]  Tobacco Use  . Smoking status: Former Smoker    Packs/day: 1.00    Years: 10.00    Pack years: 10.00    Quit date: 06/07/1980    Years since quitting: 39.3  . Smokeless tobacco: Never Used  Substance and Sexual Activity  . Alcohol use: No   . Drug use: No  . Sexual activity: Not Currently  Other Topics Concern  . Not on file  Social History Narrative   Lives with male partner in a one story home.  His son lives there off and on.  Retired from [employer].  Education: high school.  Right handed   Social Determinants of Health   Financial Resource Strain:   . Difficulty of Paying Living Expenses:   Food Insecurity:   . Worried About Charity fundraiser in the Last Year:   . Arboriculturist in the Last Year:   Transportation Needs:   . Film/video editor (Medical):   [name] Lack of Transportation (Non-Medical):   Physical Activity:   . Days of Exercise per Week:   . Minutes of Exercise per Session:   Stress:   . Feeling of Stress :   Social Connections:   . Frequency of Communication with Friends and Family:   . Frequency of Social Gatherings with Friends and Family:   . Attends Religious Services:   . Active Member of Clubs or Organizations:   . Attends Archivist Meetings:   [name] Marital Status:      Family History:  The patient's family history includes Aneurysm in his sister; Cancer in his father; Clotting disorder in his mother; Diabetes in his mother and sister; Heart attack in his mother; Heart disease in his mother; Hyperlipidemia in his mother; Hypertension in his mother and sister; Leukemia in his maternal grandmother; Lung disease in his father; Other in his sister; Seizures in his sister; Stroke in his mother.   ROS:   Please see the history of present illness.    ROS All other systems are reviewed and are negative.  PHYSICAL EXAM:   VS:  BP 114/68   Pulse 78   Ht 5' 11" (1.803 m)   Wt 195 lb 9.6 oz (88.7 kg)   SpO2 99%   BMI 27.28 kg/m     General: Alert, oriented x3, no distress, overweight, no longer obese.  Healthy loop recorder site. Head: no evidence  of trauma, PERRL, EOMI, no exophtalmos or lid lag, no myxedema, no xanthelasma; normal ears, nose and oropharynx Neck: normal jugular  venous pulsations and no hepatojugular reflux; brisk carotid pulses without delay and no carotid bruits Chest: clear to auscultation, no signs of consolidation by percussion or palpation, normal fremitus, symmetrical and full respiratory excursions Cardiovascular: normal position and quality of the apical impulse, regular rhythm, normal first and second heart sounds, 2-3/6 early peaking aortic ejection murmur, 2/6 decrescendo diastolic murmur heard on both sides of the sternal border, no apical murmurs, rubs or gallops Abdomen: no tenderness or distention, no masses by palpation, no abnormal pulsatility or arterial bruits, normal bowel sounds, no hepatosplenomegaly Extremities: no clubbing, cyanosis or edema; 2+ radial, ulnar and brachial pulses bilaterally; 2+ right femoral, posterior tibial and dorsalis pedis pulses; 2+ left femoral, posterior tibial and dorsalis pedis pulses; no subclavian or femoral bruits Neurological: grossly nonfocal Psych: Normal mood and affect   Wt Readings from Last 3 Encounters:  09/20/19 195 lb 9.6 oz (88.7 kg)  09/20/19 196 lb (88.9 kg)  09/18/19 196 lb 6.4 oz (89.1 kg)   Studies/Labs Reviewed:   EKG:  EKG is ordered today.   It shows normal sinus rhythm with first-degree AV block and rightward axis (90 degrees), mildly broadened QRS at 106 ms and a pattern of incomplete left bundle branch block, nonspecific ST-T changes, QTC 490 ms  Lipid Panel    Component Value Date/Time   CHOL 134 06/11/2019 1106   TRIG 83.0 06/11/2019 1106   HDL 41.50 06/11/2019 1106   CHOLHDL 3 06/11/2019 1106   VLDL 16.6 06/11/2019 1106   LDLCALC 76 06/11/2019 1106   LDLDIRECT 84.0 05/02/2018 1523     ASSESSMENT:    1. Chronic combined systolic and diastolic heart failure ([place])   2. PAF (paroxysmal atrial fibrillation) ([place])   3. Nonrheumatic aortic (valve) insufficiency   4. OSA (obstructive sleep apnea)   5. Overweight   6. Essential hypertension   7. Long term (current)  use of anticoagulants   8. Encounter for monitoring amiodarone therapy   9. Stage 3b chronic kidney disease   10. History of diabetes mellitus   11. Status post placement of implantable loop recorder      PLAN:  In order of problems listed above:  1. CHF (combined systolic and diastolic): Similar to the repeated past pattern, once he is returned to normal rhythm he has substantial improvement in functional status (currently NYHA functional class II).  Not on RAAS inhibitors due to renal dysfunction, but receiving hydralazine/nitrates metoprolol and loop diuretics. REDS clip was 56%, but BNP was pretty low at 373 (was 995 during HF exacerbation in December, lowest recent BNP 277).  I think he is currently euvolemic and did not recommend increasing his diuretics. 2. AFib: Currently in sinus rhythm, on amiodarone maintenance dose.  He has never been aware of palpitations.  Compliant with apixaban anticoagulation. CHADSVAsc 4 (age, HTN, CHF, history of DM). 3. Moderate AI: As before, his aortic insufficiency murmur is remarkably loud, but he does not have a dilated left ventricle and the most recent echo described the aortic insufficiency has only mild-moderate.  Continue to monitor periodically. 4. OSA: 100% compliant with CPAP with excellent clinical benefit. 5.  Overweight: He has been determined and has done an excellent job losing weight.  He is no longer obese.  This has essentially "cured" his diabetes mellitus. 6. HTN: Excellent control. 7.  Anticoagulation: Compliant without bleeding complications. 8.  Amiodarone: Had normal liver function tests in February and normal TSH in January 2021.  Needs yearly ophthalmological exam, avoidance of sun exposure.  Needs to promptly report unexplained respiratory symptoms. 9. CKD 3b: Most recent creatinine level was a little worse at 2.15.  GFR is around 35-40. 10. DM: No longer has diabetes after weight loss.  He is to continue to avoid excessive  carbohydrates. 11. ILR: Implanted as part of a clinical heart failure trial, but also be very useful for monitoring of arrhythmia recurrence.  Device interrogated today.  He had a 12-minute episode of what is probably atypical atrial flutter on April 1.  That is the longest event since his cardioversion.  Medication Adjustments/Labs and Tests Ordered: Current medicines are reviewed at length with the patient today.  Concerns regarding medicines are outlined above.  Medication changes, Labs and Tests ordered today are listed in the Patient Instructions below. Patient Instructions  Medication Instructions:  No changes *If you need a refill on your cardiac medications before your next appointment, please call your pharmacy*   Lab Work: None ordered If you have labs (blood work) drawn today and your tests are completely normal, you will receive your results only by: [name] MyChart Message (if you have MyChart) OR . A paper copy in the mail If you have any lab test that is abnormal or we need to change your treatment, we will call you to review the results.   Testing/Procedures: Your physician has requested that you have an echocardiogram. Echocardiography is a painless test that uses sound waves to create images of your heart. It provides your doctor with information about the size and shape of your heart and how well your heart's chambers and valves are working. You may receive an ultrasound enhancing agent through an IV if needed to better visualize your heart during the echo.This procedure takes approximately one hour. There are no restrictions for this procedure. This will take place at the [address].     Follow-Up: At [facility], you and your health needs are our priority.  As part of our continuing mission to provide you with exceptional heart care, we have created designated Provider Care Teams.  These Care Teams include your primary Cardiologist (physician) and Advanced  Practice Providers (APPs -  Physician Assistants and Nurse Practitioners) who all work together to provide you with the care you need, when you need it.  We recommend signing up for the patient portal called "MyChart".  Sign up information is provided on this After Visit Summary.  MyChart is used to connect with patients for Virtual Visits (Telemedicine).  Patients are able to view lab/test results, encounter notes, upcoming appointments, etc.  Non-urgent messages can be sent to your provider as well.   To learn more about what you can do with MyChart, go to [email or web address].    Your next appointment:   3 month(s)  The format for your next appointment:   In Person  Provider:   You may see Sanda Klein, MD or one of the following Advanced Practice Providers on your designated Care Team:    Almyra Deforest, PA-C  Fabian Sharp, [place] or   Roby Lofts, PA-C       Signed, Sanda Klein, MD  09/22/2019 4:07 PM    [facility] [address], [place], [place]  [address] Phone: [phone]; Fax: [phone]

## 2019-09-20 NOTE — Patient Instructions (Signed)
Chronic Kidney Disease, Adult Chronic kidney disease (CKD) occurs when the kidneys become damaged slowly over a long period of time. The kidneys are a pair of organs that do many important jobs in the body, including:  Removing waste and extra fluid from the blood to make urine.  Making hormones that maintain the amount of fluid in tissues and blood vessels.  Maintaining the right amount of fluids and chemicals in the body. A small amount of kidney damage may not cause problems, but a large amount of damage may make it hard or impossible for the kidneys to work the way they should. If steps are not taken to slow down kidney damage or to stop it from getting worse, the kidneys may stop working permanently (end-stage renal disease or ESRD). Most of the time, CKD does not go away, but it can often be controlled. People who have CKD are usually able to live normal lives. What are the causes? The most common causes of this condition are diabetes and high blood pressure (hypertension). Other causes include:  Heart and blood vessel (cardiovascular) disease.  Kidney diseases, such as: ? Glomerulonephritis. ? Interstitial nephritis. ? Polycystic kidney disease. ? Renal vascular disease.  Diseases that affect the immune system.  Genetic diseases.  Medicines that damage the kidneys, such as anti-inflammatory medicines.  Being around or being in contact with poisonous (toxic) substances.  A kidney or urinary infection that occurs again and again (recurs).  Vasculitis. This is swelling or inflammation of the blood vessels.  A problem with urine flow that may be caused by: ? Cancer. ? Having kidney stones more than one time. ? An enlarged prostate, in males. What increases the risk? You are more likely to develop this condition if you:  Are older than age 60.  Are male.  Are African-American, Hispanic, Asian, Pacific Islander, or American Indian.  Are a current or former smoker.   Are obese.  Have a family history of kidney disease or failure.  Often take medicines that are damaging to the kidneys. What are the signs or symptoms? Symptoms of this condition include:  Swelling (edema) of the face, legs, ankles, or feet.  Tiredness (lethargy) and having less energy.  Nausea or vomiting.  Confusion or trouble concentrating.  Problems with urination, such as: ? Painful or burning feeling during urination. ? Decreased urine production. ? Frequent urination, especially at night. ? Bloody urine.  Muscle twitches and cramps, especially in the legs.  Shortness of breath.  Weakness.  Loss of appetite.  Metallic taste in the mouth.  Trouble sleeping.  Dry, itchy skin.  A low blood count (anemia).  Pale lining of the eyelids and surface of the eye (conjunctiva). Symptoms develop slowly and may not be obvious until the kidney damage becomes severe. It is possible to have kidney disease for years without having any symptoms. How is this diagnosed? This condition may be diagnosed based on:  Blood tests.  Urine tests.  Imaging tests, such as an ultrasound or CT scan.  A test in which a sample of tissue is removed from the kidneys to be examined under a microscope (kidney biopsy). These test results will help your health care provider determine how serious the CKD is. How is this treated? There is no cure for most cases of this condition, but treatment usually relieves symptoms and prevents or slows the progression of the disease. Treatment may include:  Making diet changes, which may require you to avoid alcohol, salty foods (sodium),   and foods that are high in potassium, calcium, and protein.  Medicines: ? To lower blood pressure. ? To control blood glucose. ? To relieve anemia. ? To relieve swelling. ? To protect your bones. ? To improve the balance of electrolytes in your blood.  Removing toxic waste from the body through types of dialysis, if  the kidneys can no longer do their job (kidney failure).  Managing any other conditions that are causing your CKD or making it worse. Follow these instructions at home: Medicines  Take over-the-counter and prescription medicines only as told by your health care provider. The dose of some medicines that you take may need to be adjusted.  Do not take any new medicines unless approved by your health care provider. Many medicines can worsen your kidney damage.  Do not take any vitamin and mineral supplements unless approved by your health care provider. Many nutritional supplements can worsen your kidney damage. General instructions  Follow your prescribed diet as told by your health care provider.  Do not use any products that contain nicotine or tobacco, such as cigarettes and e-cigarettes. If you need help quitting, ask your health care provider.  Monitor and track your blood pressure at home. Report changes in your blood pressure as told by your health care provider.  If you are being treated for diabetes, monitor and track your blood sugar (blood glucose) levels as told by your health care provider.  Maintain a healthy weight. If you need help with this, ask your health care provider.  Start or continue an exercise plan. Exercise at least 30 minutes a day, 5 days a week.  Keep your immunizations up to date as told by your health care provider.  Keep all follow-up visits as told by your health care provider. This is important. Where to find more information  American Association of Kidney Patients: www.aakp.org  National Kidney Foundation: www.kidney.org  American Kidney Fund: www.akfinc.org  [facility]: www.lifeoptions.org and www.kidneyschool.org Contact a health care provider if:  Your symptoms get worse.  You develop new symptoms. Get help right away if:  You develop symptoms of ESRD, which include: ? Headaches. ? Numbness in the hands or  feet. ? Easy bruising. ? Frequent hiccups. ? Chest pain. ? Shortness of breath. ? Lack of menstruation, in women.  You have a fever.  You have decreased urine production.  You have pain or bleeding when you urinate. Summary  Chronic kidney disease (CKD) occurs when the kidneys become damaged slowly over a long period of time.  The most common causes of this condition are diabetes and high blood pressure (hypertension).  There is no cure for most cases of this condition, but treatment usually relieves symptoms and prevents or slows the progression of the disease. Treatment may include a combination of medicines and lifestyle changes. This information is not intended to replace advice given to you by your health care provider. Make sure you discuss any questions you have with your health care provider. Document Revised: 05/06/2017 Document Reviewed: 07/01/2016 Elsevier Patient Education  2020 Elsevier Inc.  

## 2019-09-20 NOTE — Assessment & Plan Note (Signed)
On Bipap and his events have dropped from 41 events per hour to 5 events and is tolerating the machine well

## 2019-09-20 NOTE — Patient Instructions (Signed)
Medication Instructions:  No changes *If you need a refill on your cardiac medications before your next appointment, please call your pharmacy*   Lab Work: None ordered If you have labs (blood work) drawn today and your tests are completely normal, you will receive your results only by: . MyChart Message (if you have MyChart) OR . A paper copy in the mail If you have any lab test that is abnormal or we need to change your treatment, we will call you to review the results.   Testing/Procedures: Your physician has requested that you have an echocardiogram. Echocardiography is a painless test that uses sound waves to create images of your heart. It provides your doctor with information about the size and shape of your heart and how well your heart's chambers and valves are working. You may receive an ultrasound enhancing agent through an IV if needed to better visualize your heart during the echo.This procedure takes approximately one hour. There are no restrictions for this procedure. This will take place at the [address], [address].     Follow-Up: At [facility], you and your health needs are our priority.  As part of our continuing mission to provide you with exceptional heart care, we have created designated Provider Care Teams.  These Care Teams include your primary Cardiologist (physician) and Advanced Practice Providers (APPs -  Physician Assistants and Nurse Practitioners) who all work together to provide you with the care you need, when you need it.  We recommend signing up for the patient portal called "MyChart".  Sign up information is provided on this After Visit Summary.  MyChart is used to connect with patients for Virtual Visits (Telemedicine).  Patients are able to view lab/test results, encounter notes, upcoming appointments, etc.  Non-urgent messages can be sent to your provider as well.   To learn more about what you can do with MyChart, go to [email or web address]://www.mychart.com.     Your next appointment:   3 month(s)  The format for your next appointment:   In Person  Provider:   You may see Mihai Croitoru, MD or one of the following Advanced Practice Providers on your designated Care Team:    Hao Meng, PA-C  Angela Duke, PA-C or   Krista Kroeger, PA-C  

## 2019-09-20 NOTE — Assessment & Plan Note (Signed)
Has had a bnp elevated at 373.1 so they increased his Lasix to 80 mg in am and 40 mg in pm x 2 days then decrease to 80 mg in am daily again, they will recheck his labs next week.

## 2019-09-22 ENCOUNTER — Encounter: Payer: Self-pay | Admitting: Cardiovascular Disease

## 2019-09-22 NOTE — Assessment & Plan Note (Signed)
Previously received a prescription for Hydrocortisone 0.25% to use prn and had a good response he requests a refill today and it is given

## 2019-09-22 NOTE — Progress Notes (Signed)
Subjective:    Patient ID: [name], male    DOB: 09-Mar-1951, 69 y.o.   MRN: [id number]  Chief Complaint  Patient presents with  . 2 month follow up    HPI Patient is in today for follow up on chronic medical concerns and follow up on hospitalizations. He had to under go a cardioversion annd tolerated it well. He is now working with cardiology for persistent trouble with fluid over load and an elevated BNP. He was given extra lasix this week which he tolerated well and he has a repeat set of labs set for next week. Otherwise he reports he is feeling some better. No pedal edema. Has soe DOE but not extreme. No recent febrile illness. Denies CP/palp/HA/congestion/fevers/GI or GU c/o. Taking meds as prescribed  Past Medical History:  Diagnosis Date  . AORTIC STENOSIS, MODERATE 12/05/2009  . Atrial fibrillation ([place])   . Atrial fibrillation with RVR ([place]) 07/05/2017  . Atrial flutter ([place]) 12/05/2009  . Atypical chest pain 11/08/2011  . BENIGN PROSTATIC HYPERTROPHY, HX OF 12/05/2009  . CHEST PAIN-UNSPECIFIED 11/11/2009  . CHF 12/05/2009  . Chronic renal insufficiency 12/16/2016  . Debility 04/18/2012  . Diarrhea 12/16/2016  . DYSPNEA 12/19/2009  . Edema 04/18/2012  . ERECTILE DYSFUNCTION 01/25/2007  . FATIGUE, CHRONIC 11/11/2009  . Gout 07/10/2012  . Heart murmur   . History of kidney stones    "they passed" (07/05/2017)  . Hyperkalemia 04/18/2012  . Hyperlipidemia   . Hypertension   . HYPERTENSION 01/25/2007  . Hypoxia 05/05/2011  . Insomnia 05/13/2016  . INSOMNIA, HX OF 01/25/2007  . Knee pain, bilateral 12/28/2010  . Knee pain, right 12/28/2010  . NEUROMA 01/05/2010  . OBESITY NOS 01/25/2007  . OSA on CPAP 04/06/2010  . PERIPHERAL NEUROPATHY 01/05/2010  . PULMONARY FUNCTION TESTS, ABNORMAL 02/02/2010  . Superficial thrombophlebitis of left leg 09/07/2011  . TESTICULAR HYPOFUNCTION 01/05/2010  . 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 Toe pain, right 07/10/2012  . Type II diabetes mellitus ([place]) 12/05/2009  .  UNSPECIFIED ANEMIA 01/05/2010    Past Surgical History:  Procedure Laterality Date  . A-FLUTTER ABLATION N/A 05/16/2019   Procedure: A-FLUTTER ABLATION;  Surgeon: Constance Haw, MD;  Location: [place] [facility];  Service: Cardiovascular;  Laterality: N/A;  . ATRIAL FIBRILLATION ABLATION N/A 05/16/2019   Procedure: ATRIAL FIBRILLATION ABLATION;  Surgeon: Constance Haw, MD;  Location: [place] [facility];  Service: Cardiovascular;  Laterality: N/A;  . CARDIAC CATHETERIZATION  10/16/2009   nonischemic cardiomyopathy  . CARDIOVERSION N/A 03/25/2015   Procedure: CARDIOVERSION;  Surgeon: Pixie Casino, MD;  Location: [place];  Service: Cardiovascular;  Laterality: N/A;  . CARDIOVERSION N/A 06/17/2017   Procedure: CARDIOVERSION;  Surgeon: Sanda Klein, MD;  Location: [place] [facility];  Service: Cardiovascular;  Laterality: N/A;  . CARDIOVERSION N/A 07/08/2017   Procedure: CARDIOVERSION;  Surgeon: Fay Records, MD;  [facility];  Service: Cardiovascular;  Laterality: N/A;  . CARDIOVERSION N/A 02/27/2019   Procedure: CARDIOVERSION;  Surgeon: Sanda Klein, MD;  Location: [facility];  Service: Cardiovascular;  Laterality: N/A;  . CARDIOVERSION N/A 08/16/2019   Procedure: CARDIOVERSION;  Surgeon: Sanda Klein, MD;  Location: [facility];  Service: Cardiovascular;  Laterality: N/A;  . METATARSAL OSTEOTOMY Bilateral ~ 1980   removed part of 5th metatarsal to corect curvature of toes   . RIGHT HEART CATH N/A 05/16/2019   Procedure: RIGHT HEART CATH;  Surgeon: Jolaine Artist, MD;  Location: [place] [facility];  Service: Cardiovascular;  Laterality: N/A;  . TEE WITHOUT CARDIOVERSION  05/16/2019   Procedure: Transesophageal Echocardiogram (Tee);  Surgeon: Constance Haw, MD;  Location: [place] [facility];  Service: Cardiovascular;;    Family History  Problem Relation Age of Onset  . Clotting disorder Mother   . Heart disease Mother        s/p MI  . Heart attack  Mother   . Hypertension Mother   . Diabetes Mother   . Hyperlipidemia Mother   . Stroke Mother   . Cancer Father        ? lung  . Lung disease Father        smoker  . Diabetes Sister   . Hypertension Sister        smoker  . Leukemia Maternal Grandmother        ?  [name] Aneurysm Sister        brain  . Other Sister        clipped  . Seizures Sister        d/o w/aneurysm/ smoker    Social History   Socioeconomic History  . Marital status: Married    Spouse name: Not on file  . Number of children: 4  . Years of education: 20  . Highest education level: High school graduate  Occupational History  . Occupation: retired    Fish farm manager: [employer]  Tobacco Use  . Smoking status: Former Smoker    Packs/day: 1.00    Years: 10.00    Pack years: 10.00    Quit date: 06/07/1980    Years since quitting: 39.3  . Smokeless tobacco: Never Used  Substance and Sexual Activity  . Alcohol use: No  . Drug use: No  . Sexual activity: Not Currently  Other Topics Concern  . Not on file  Social History Narrative   Lives with male partner in a one story home.  His son lives there off and on.  Retired from [employer].  Education: high school.  Right handed   Social Determinants of Health   Financial Resource Strain:   . Difficulty of Paying Living Expenses:   Food Insecurity:   . Worried About Charity fundraiser in the Last Year:   . Arboriculturist in the Last Year:   Transportation Needs:   . Film/video editor (Medical):   [name] Lack of Transportation (Non-Medical):   Physical Activity:   . Days of Exercise per Week:   . Minutes of Exercise per Session:   Stress:   . Feeling of Stress :   Social Connections:   . Frequency of Communication with Friends and Family:   . Frequency of Social Gatherings with Friends and Family:   . Attends Religious Services:   . Active Member of Clubs or Organizations:   . Attends Archivist Meetings:   [name] Marital Status:   Intimate Partner  Violence:   . Fear of Current or Ex-Partner:   . Emotionally Abused:   [name] Physically Abused:   . Sexually Abused:     Outpatient Medications Prior to Visit  Medication Sig Dispense Refill  . albuterol (VENTOLIN HFA) 108 (90 Base) MCG/ACT inhaler Inhale 2 puffs into the lungs every 6 (six) hours as needed for wheezing or shortness of breath. 1 Inhaler 6  . allopurinol (ZYLOPRIM) 300 MG tablet Take 1 tablet by mouth once daily 90 tablet 0  . amiodarone (PACERONE) 200 MG tablet Take 1 tablet (200 mg  total) by mouth daily. 90 tablet 1  . Blood Glucose Monitoring Suppl (ONETOUCH VERIO) w/Device KIT Use to check blood sugar TID as directed.  Dx Code: E11.9 1 kit 0  . cetirizine (ZYRTEC) 10 MG tablet Take 1 tablet (10 mg total) by mouth daily. 30 tablet 11  . Clobetasol Prop Emollient Base (CLOBETASOL PROPIONATE E) 0.05 % emollient cream Apply 1 application topically 2 (two) times daily as needed (insect bite). 60 g 1  . colchicine (COLCRYS) 0.6 MG tablet TAKE ONE TABLET BY MOUTH DAILY AS NEEDED FOR GOUT FLARE UPS. 30 tablet 0  . ELIQUIS 5 MG TABS tablet Take 1 tablet by mouth twice daily 60 tablet 10  . fish oil-omega-3 fatty acids 1000 MG capsule Take 1 g by mouth daily.     . furosemide (LASIX) 80 MG tablet Take 1 tablet (80 mg total) by mouth daily. And 1 tab as needed for swelling or shortness of breath 30 tablet 6  . glucose blood test strip (Verio) Use as instructed Check blood sugars TID DxE11.9 100 each 12  . hydrALAZINE (APRESOLINE) 25 MG tablet Take 1 tablet (25 mg total) by mouth 3 (three) times daily. 90 tablet 6  . HYDROcodone-acetaminophen (NORCO) 10-325 MG tablet Take 1 tablet by mouth every 8 (eight) hours as needed for moderate pain or severe pain. 90 tablet 0  . hydrocortisone 2.5 % ointment Apply topically 2 (two) times daily.    . isosorbide mononitrate (IMDUR) 30 MG 24 hr tablet Take 1 tablet (30 mg total) by mouth daily. 30 tablet 11  . LORazepam (ATIVAN) 1 MG tablet Take 1  tablet (1 mg total) by mouth every 8 (eight) hours as needed for anxiety. 30 tablet 5  . magnesium oxide (MAG-OX) 400 MG tablet Take 1 tablet (400 mg total) by mouth 2 (two) times daily. 30 tablet 11  . metoprolol succinate (TOPROL-XL) 25 MG 24 hr tablet Take 0.5 tablets (12.5 mg total) by mouth 2 (two) times daily. 90 tablet 3  . Multiple Vitamin (MULTIVITAMIN WITH MINERALS) TABS tablet Take 1 tablet by mouth daily.     . naproxen (NAPROSYN) 375 MG tablet Take 375 mg by mouth 2 (two) times daily as needed (knee pain.).     [name] potassium chloride SA (K-DUR,KLOR-CON) 20 MEQ tablet TAKE 1 TABLET BY MOUTH ONCE DAILY AND  EXTRA  TABLET  IF  WEIGHT  IS  ABOVE  210  LBS 180 tablet 2  . pravastatin (PRAVACHOL) 40 MG tablet Take 1 tablet by mouth once daily 90 tablet 3  . tamsulosin (FLOMAX) 0.4 MG CAPS capsule Take 1 capsule by mouth once daily 30 capsule 0  . temazepam (RESTORIL) 30 MG capsule Take 1 capsule (30 mg total) by mouth at bedtime. 30 capsule 5   No facility-administered medications prior to visit.    Allergies  Allergen Reactions  . Sulfonamide Derivatives Other (See Comments)    unknown reaction.  Patient states had to go to the ER.   [name] Zolpidem Other (See Comments)    Excessive, prolonged sedation    Review of Systems  Constitutional: Positive for malaise/fatigue. Negative for fever.  HENT: Negative for congestion.   Eyes: Negative for blurred vision.  Respiratory: Positive for shortness of breath.   Cardiovascular: Negative for chest pain, palpitations and leg swelling.  Gastrointestinal: Negative for abdominal pain, blood in stool and nausea.  Genitourinary: Negative for dysuria and frequency.  Musculoskeletal: Negative for falls.  Skin: Positive for rash.  Neurological: Negative  for dizziness, loss of consciousness and headaches.  Endo/Heme/Allergies: Negative for environmental allergies.  Psychiatric/Behavioral: Negative for depression. The patient is not nervous/anxious.          Objective:    Physical Exam Vitals and nursing note reviewed.  Constitutional:      General: He is not in acute distress.    Appearance: He is well-developed.  HENT:     Head: Normocephalic and atraumatic.     Nose: Nose normal.  Eyes:     General:        Right eye: No discharge.        Left eye: No discharge.  Cardiovascular:     Rate and Rhythm: Normal rate and regular rhythm.     Heart sounds: Murmur present.  Pulmonary:     Effort: Pulmonary effort is normal.     Breath sounds: Normal breath sounds.  Abdominal:     General: Bowel sounds are normal.     Palpations: Abdomen is soft.     Tenderness: There is no abdominal tenderness.  Musculoskeletal:     Cervical back: Normal range of motion and neck supple.  Skin:    General: Skin is warm and dry.  Neurological:     Mental Status: He is alert and oriented to person, place, and time.     BP 102/60 (BP Location: Left Arm, Cuff Size: Large)   Pulse 77   Temp 98 F (36.7 C) (Temporal)   Resp 12   Ht '5\' 11"'$  (1.803 m)   Wt 196 lb (88.9 kg)   SpO2 95%   BMI 27.34 kg/m  Wt Readings from Last 3 Encounters:  09/20/19 195 lb 9.6 oz (88.7 kg)  09/20/19 196 lb (88.9 kg)  09/18/19 196 lb 6.4 oz (89.1 kg)    Diabetic Foot Exam - Simple   No data filed     Lab Results  Component Value Date   WBC 3.7 (L) 09/18/2019   HGB 10.9 (L) 09/18/2019   HCT 36.9 (L) 09/18/2019   PLT 194 09/18/2019   GLUCOSE 131 (H) 09/18/2019   CHOL 134 06/11/2019   TRIG 83.0 06/11/2019   HDL 41.50 06/11/2019   LDLDIRECT 84.0 05/02/2018   LDLCALC 76 06/11/2019   ALT 23 07/23/2019   AST 17 07/23/2019   NA 139 09/18/2019   K 4.7 09/18/2019   CL 103 09/18/2019   CREATININE 2.15 (H) 09/18/2019   BUN 33 (H) 09/18/2019   CO2 27 09/18/2019   TSH 1.91 06/11/2019   PSA 10.76 (H) 02/06/2019   INR 2.2 09/06/2018   HGBA1C 5.3 09/14/2019   MICROALBUR 1.1 04/14/2015    Lab Results  Component Value Date   TSH 1.91 06/11/2019   Lab  Results  Component Value Date   WBC 3.7 (L) 09/18/2019   HGB 10.9 (L) 09/18/2019   HCT 36.9 (L) 09/18/2019   MCV 86.6 09/18/2019   PLT 194 09/18/2019   Lab Results  Component Value Date   NA 139 09/18/2019   K 4.7 09/18/2019   CO2 27 09/18/2019   GLUCOSE 131 (H) 09/18/2019   BUN 33 (H) 09/18/2019   CREATININE 2.15 (H) 09/18/2019   BILITOT 0.7 07/23/2019   ALKPHOS 56 07/23/2019   AST 17 07/23/2019   ALT 23 07/23/2019   PROT 6.8 07/23/2019   ALBUMIN 4.0 07/23/2019   CALCIUM 9.3 09/18/2019   ANIONGAP 9 09/18/2019   GFR 43.05 (L) 08/02/2019   Lab Results  Component Value Date   CHOL  134 06/11/2019   Lab Results  Component Value Date   HDL 41.50 06/11/2019   Lab Results  Component Value Date   LDLCALC 76 06/11/2019   Lab Results  Component Value Date   TRIG 83.0 06/11/2019   Lab Results  Component Value Date   CHOLHDL 3 06/11/2019   Lab Results  Component Value Date   HGBA1C 5.3 09/14/2019       Assessment & Plan:   Problem List Items Addressed This Visit    Diabetes mellitus type 2 in obese ([place])    hgba1c acceptable, minimize simple carbs. Increase exercise as tolerated. Continue current meds      Chronic combined systolic and diastolic heart failure ([place])    Has had a bnp elevated at 373.1 so they increased his Lasix to 80 mg in am and 40 mg in pm x 2 days then decrease to 80 mg in am daily again, they will recheck his labs next week.      Hyperlipidemia    Tolerating statin, encouraged heart healthy diet, avoid trans fats, minimize simple carbs and saturated fats. Increase exercise as tolerated      PAF (paroxysmal atrial fibrillation) ([facility])    Has had a cardioversion with last visit with Dr C and he tolerated it well      OSA (obstructive sleep apnea)    On Bipap and his events have dropped from 41 events per hour to 5 events and is tolerating the machine well      Dermatitis    Previously received a prescription for Hydrocortisone 0.25% to  use prn and had a good response he requests a refill today and it is given      Essential hypertension, malignant    Well controlled, no changes to meds. Encouraged heart healthy diet such as the DASH diet and exercise as tolerated.          I am having [name] start on hydrocortisone. I am also having him maintain his fish oil-omega-3 fatty acids, cetirizine, colchicine, albuterol, potassium chloride SA, HYDROcodone-acetaminophen, glucose blood, multivitamin with minerals, OneTouch Verio, Eliquis, pravastatin, naproxen, hydrALAZINE, isosorbide mononitrate, tamsulosin, magnesium oxide, allopurinol, LORazepam, temazepam, Clobetasol Prop Emollient Base, furosemide, amiodarone, metoprolol succinate, and hydrocortisone.  Meds ordered this encounter  Medications  . hydrocortisone 2.5 % ointment    Sig: Apply topically 2 (two) times daily as needed.    Dispense:  30 g    Refill:  1     Penni Homans, MD

## 2019-09-22 NOTE — Assessment & Plan Note (Signed)
Tolerating statin, encouraged heart healthy diet, avoid trans fats, minimize simple carbs and saturated fats. Increase exercise as tolerated 

## 2019-09-25 ENCOUNTER — Encounter: Payer: Self-pay | Admitting: *Deleted

## 2019-09-26 DIAGNOSIS — N1832 Chronic kidney disease, stage 3b: Secondary | ICD-10-CM | POA: Diagnosis not present

## 2019-09-26 DIAGNOSIS — I129 Hypertensive chronic kidney disease with stage 1 through stage 4 chronic kidney disease, or unspecified chronic kidney disease: Secondary | ICD-10-CM | POA: Diagnosis not present

## 2019-09-26 DIAGNOSIS — E1122 Type 2 diabetes mellitus with diabetic chronic kidney disease: Secondary | ICD-10-CM | POA: Diagnosis not present

## 2019-10-01 DIAGNOSIS — N401 Enlarged prostate with lower urinary tract symptoms: Secondary | ICD-10-CM | POA: Diagnosis not present

## 2019-10-01 DIAGNOSIS — R3914 Feeling of incomplete bladder emptying: Secondary | ICD-10-CM | POA: Diagnosis not present

## 2019-10-01 DIAGNOSIS — R972 Elevated prostate specific antigen [PSA]: Secondary | ICD-10-CM | POA: Diagnosis not present

## 2019-10-01 DIAGNOSIS — E291 Testicular hypofunction: Secondary | ICD-10-CM | POA: Diagnosis not present

## 2019-10-09 NOTE — Addendum Note (Signed)
Addended by: Wonda Horner on: 10/09/2019 01:31 PM   Modules accepted: Orders

## 2019-10-14 ENCOUNTER — Other Ambulatory Visit: Payer: Self-pay | Admitting: Cardiovascular Disease

## 2019-10-17 ENCOUNTER — Other Ambulatory Visit: Payer: Self-pay | Admitting: Cardiovascular Disease

## 2019-10-17 NOTE — Telephone Encounter (Signed)
° ° °*  STAT* If patient is at the pharmacy, call can be transferred to refill team.   1. Which medications need to be refilled? (please list name of each medication and dose if known) potassium chloride SA (K-DUR,KLOR-CON) 20 MEQ tablet  2. Which pharmacy/location (including street and city if local pharmacy) is medication to be sent to? [place], [place]  3. Do they need a 30 day or 90 day supply? 90 days

## 2019-10-21 ENCOUNTER — Other Ambulatory Visit: Payer: Self-pay | Admitting: Family Medicine

## 2019-10-21 DIAGNOSIS — G4700 Insomnia, unspecified: Secondary | ICD-10-CM

## 2019-10-21 DIAGNOSIS — I10 Essential (primary) hypertension: Secondary | ICD-10-CM

## 2019-10-21 DIAGNOSIS — Z7901 Long term (current) use of anticoagulants: Secondary | ICD-10-CM

## 2019-10-21 DIAGNOSIS — E1169 Type 2 diabetes mellitus with other specified complication: Secondary | ICD-10-CM

## 2019-10-21 DIAGNOSIS — H409 Unspecified glaucoma: Secondary | ICD-10-CM

## 2019-10-21 DIAGNOSIS — D649 Anemia, unspecified: Secondary | ICD-10-CM

## 2019-10-21 DIAGNOSIS — E669 Obesity, unspecified: Secondary | ICD-10-CM

## 2019-10-21 DIAGNOSIS — E785 Hyperlipidemia, unspecified: Secondary | ICD-10-CM

## 2019-10-21 DIAGNOSIS — E875 Hyperkalemia: Secondary | ICD-10-CM

## 2019-10-21 DIAGNOSIS — M25562 Pain in left knee: Secondary | ICD-10-CM

## 2019-10-21 DIAGNOSIS — F411 Generalized anxiety disorder: Secondary | ICD-10-CM

## 2019-10-21 DIAGNOSIS — M25561 Pain in right knee: Secondary | ICD-10-CM

## 2019-11-08 ENCOUNTER — Encounter (INDEPENDENT_AMBULATORY_CARE_PROVIDER_SITE_OTHER): Payer: Medicare Other

## 2019-11-08 ENCOUNTER — Other Ambulatory Visit: Payer: Self-pay

## 2019-11-08 VITALS — BP 125/67 | HR 79 | Temp 98.8°F | Resp 16 | Ht 71.0 in | Wt 188.6 lb

## 2019-11-08 DIAGNOSIS — Z006 Encounter for examination for normal comparison and control in clinical research program: Secondary | ICD-10-CM

## 2019-11-08 NOTE — Research (Signed)
Alleviate Research Study  Observational Arm  6 Month Follow up  Patient doing well at this time, no adverse events. Will transition into the interventional arm at this time.  Current Outpatient Medications:    albuterol (VENTOLIN HFA) 108 (90 Base) MCG/ACT inhaler, Inhale 2 puffs into the lungs every 6 (six) hours as needed for wheezing or shortness of breath., Disp: 1 Inhaler, Rfl: 6   allopurinol (ZYLOPRIM) 300 MG tablet, Take 1 tablet by mouth once daily, Disp: 90 tablet, Rfl: 0   amiodarone (PACERONE) 200 MG tablet, Take 1 tablet (200 mg total) by mouth daily., Disp: 90 tablet, Rfl: 1   Blood Glucose Monitoring Suppl (ONETOUCH VERIO) w/Device KIT, Use to check blood sugar TID as directed.  Dx Code: E11.9, Disp: 1 kit, Rfl: 0   cetirizine (ZYRTEC) 10 MG tablet, Take 1 tablet (10 mg total) by mouth daily., Disp: 30 tablet, Rfl: 11   Clobetasol Prop Emollient Base (CLOBETASOL PROPIONATE E) 0.05 % emollient cream, Apply 1 application topically 2 (two) times daily as needed (insect bite)., Disp: 60 g, Rfl: 1   colchicine (COLCRYS) 0.6 MG tablet, TAKE ONE TABLET BY MOUTH DAILY AS NEEDED FOR GOUT FLARE UPS., Disp: 30 tablet, Rfl: 0   ELIQUIS 5 MG TABS tablet, Take 1 tablet by mouth twice daily, Disp: 60 tablet, Rfl: 10   fish oil-omega-3 fatty acids 1000 MG capsule, Take 1 g by mouth daily. , Disp: , Rfl:    furosemide (LASIX) 80 MG tablet, Take 1 tablet (80 mg total) by mouth daily. 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, Disp: 100 each, Rfl: 12   hydrALAZINE (APRESOLINE) 25 MG tablet, Take 1 tablet (25 mg total) by mouth 3 (three) times daily., Disp: 90 tablet, Rfl: 6   HYDROcodone-acetaminophen (NORCO) 10-325 MG tablet, Take 1 tablet by mouth every 8 (eight) hours as needed for moderate pain or severe pain., Disp: 90 tablet, Rfl: 0   hydrocortisone 2.5 %  ointment, Apply topically 2 (two) times daily., Disp: , Rfl:    hydrocortisone 2.5 % ointment, Apply topically 2 (two) times daily as needed., Disp: 30 g, Rfl: 1   isosorbide mononitrate (IMDUR) 30 MG 24 hr tablet, Take 1 tablet (30 mg total) by mouth daily., Disp: 30 tablet, Rfl: 11   LORazepam (ATIVAN) 1 MG tablet, Take 1 tablet (1 mg total) by mouth every 8 (eight) hours as needed for anxiety., Disp: 30 tablet, Rfl: 5   magnesium oxide (MAG-OX) 400 MG tablet, Take 1 tablet (400 mg total) by mouth 2 (two) times daily., Disp: 30 tablet, Rfl: 11   metoprolol succinate (TOPROL-XL) 25 MG 24 hr tablet, Take 0.5 tablets (12.5 mg total) by mouth 2 (two) times daily., Disp: 90 tablet, Rfl: 3   Multiple Vitamin (MULTIVITAMIN WITH MINERALS) TABS tablet, Take 1 tablet by mouth daily. , Disp: , Rfl:    naproxen (NAPROSYN) 375 MG tablet, Take 375 mg by mouth 2 (two) times daily as needed (knee pain.). , Disp: , Rfl:    potassium chloride SA (KLOR-CON) 20 MEQ tablet, TAKE 1 TABLET BY MOUTH ONCE DAILY AND  AN  EXTRA  TABLET  IF  WEIGHT  IS  ABOVE  210  LBS, Disp: 180 tablet, Rfl: 1   pravastatin (PRAVACHOL) 40 MG tablet, Take 1 tablet by mouth once daily, Disp: 90 tablet, Rfl: 3  tamsulosin (FLOMAX) 0.4 MG CAPS capsule, Take 1 capsule by mouth once daily, Disp: 30 capsule, Rfl: 0   temazepam (RESTORIL) 30 MG capsule, Take 1 capsule (30 mg total) by mouth at bedtime., Disp: 30 capsule, Rfl: 5   [id number]  MOBILITY:    I HAVE NO PROBLEMS WALKING [x]  I HAVE SLIGHT PROBLEMS WALKING []  I HAVE MODERATE PROBLEMS WALKING []  I HAVE SEVERE PROBLEMS WALKING []  I AM UNABLE TO WALK  []    SELF-CARE:   I HAVE NO PROBLEMS WASHING OR DRESSING MYSELF  [x]  I HAVE SLIGHT PROBLEMS WASHING OR DRESSING MYSELF  []  I HAVE MODERATE PROBLEMS WASHING OR DRESSING MYSELF []  I HAVE SEVERE PROBLEMS WASHING OR DRESSING MYSELF  []  I HAVE SEVERE PROBLEMS WASHING OR DRESSING MYSELF  []  I AM UNABLE TO [place] OR DRESS  MYSELF []    USUAL ACTIVITIES: (E.G. WORK/STUDY/HOUSEWORK/FAMILY OR LEISURE ACTIVITIES.    I HAVE NO PROBLEMS DOING MY USUAL ACTIVITIES [x]  I HAVE SLIGHT PROBLEMS DOING MY USUAL ACTIVITIES []  I HAVE MODERATE PROBLEMS DOING MY USUAL ACTIVIITIES []  I HAVE SEVERE PROBLEMS DOING MY USUAL ACTIVITIES []  I AM UNABLE TO DO MY USUAL ACTIVITIES []    PAIN /DISCOMFORT   I HAVE NO PAIN OR DISCOMFORT [x]  I HAVE SLIGHT PAIN OR DISCOMFORT []  I HAVE MODERATE PAIN OR DISCOMFORT []  I HAVE SEVERE PAIN OR DISCOMFORT []  I HAVE EXTREME PAIN OR DISCOMFORT []    ANXIETY/DEPRESSION   I AM NOT ANXIOUS OR DEPRESSED [x]  I AM SLIGHTLY ANXIOUS OR DEPRESSED []  I AM MODERATELY ANXIOUS OR DREPRESSED []  I AM SEVERELY ANXIOUS OR DEPRESSED []  I AM EXTREMELY ANXIOUS OR DEPRESSED []    SCALE OF 0-100 HOW WOULD YOU RATE TODAY?  0 IS THE WORSE AND 100 IS THE BEST HEALTH YOU CAN IMAGINE: 95

## 2019-11-08 NOTE — Progress Notes (Signed)
Cardiology research History and Physical:   Patient ID: [name] MRN: [id number]; DOB: 10/04/1950    Primary Care Provider: [name], MD Primary Cardiologist: Sanda Klein, MD  Primary Electrophysiologist:  Constance Haw, MD   Chief Complaint: AlleviateHF protocol transition to active intervention normal  Patient Profile:   [name] is a 69 y.o. male with nonischemic cardiomyopathy and history of recurrent atrial flutter and atrial fibrillation congestive heart failure stage D with depressed left ventricular systolic function.  He had an implanted loop recorder in December 2020 as part of the Alleviate study.  History of Present Illness:   [name] has been doing very well.  He has NYHA functional class I status.  He was able to build a deck on the back of his house.  He was not limited by shortness of breath.  He denies orthopnea or PND.  He has not had palpitations.  He denies dizziness, lightheadedness or syncope.  He has not had any falls, injuries or bleeding problems or any focal neurological events.  Intermittently he will have very mild swelling in his left ankle which he can estimate based on whether or not he can grip around his ankle fully with his thumb and index finger.  He has not had to take any extra doses of diuretics.  He does not have angina pectoris or intermittent claudication.  From an arrhythmia point of view, his loop recorder has shown occasional brief episodes of what is probably atypical atrial flutter, but no sustained arrhythmia.  He has not had any episodes of significant sustained tachycardia.  The loop recorder site does not bother him.  He is meticulous in monitoring his blood pressure and weight on a daily basis.  He has been slowly and steadily losing weight down to his target of 175 pounds.  If he takes his time to sit and relax, his blood pressure is consistently well controlled.   Past Medical History:  Diagnosis Date    . AORTIC STENOSIS, MODERATE 12/05/2009  . Atrial fibrillation ([place])   . Atrial fibrillation with RVR ([place]) 07/05/2017  . Atrial flutter ([place]) 12/05/2009  . Atypical chest pain 11/08/2011  . BENIGN PROSTATIC HYPERTROPHY, HX OF 12/05/2009  . CHEST PAIN-UNSPECIFIED 11/11/2009  . CHF 12/05/2009  . Chronic renal insufficiency 12/16/2016  . Debility 04/18/2012  . Diarrhea 12/16/2016  . DYSPNEA 12/19/2009  . Edema 04/18/2012  . ERECTILE DYSFUNCTION 01/25/2007  . FATIGUE, CHRONIC 11/11/2009  . Gout 07/10/2012  . Heart murmur   . History of kidney stones    "they passed" (07/05/2017)  . Hyperkalemia 04/18/2012  . Hyperlipidemia   . Hypertension   . HYPERTENSION 01/25/2007  . Hypoxia 05/05/2011  . Insomnia 05/13/2016  . INSOMNIA, HX OF 01/25/2007  . Knee pain, bilateral 12/28/2010  . Knee pain, right 12/28/2010  . NEUROMA 01/05/2010  . OBESITY NOS 01/25/2007  . OSA on CPAP 04/06/2010  . PERIPHERAL NEUROPATHY 01/05/2010  . PULMONARY FUNCTION TESTS, ABNORMAL 02/02/2010  . Superficial thrombophlebitis of left leg 09/07/2011  . TESTICULAR HYPOFUNCTION 01/05/2010  . 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 Toe pain, right 07/10/2012  . Type II diabetes mellitus ([place]) 12/05/2009  . UNSPECIFIED ANEMIA 01/05/2010    Past Surgical History:  Procedure Laterality Date  . A-FLUTTER ABLATION N/A 05/16/2019   Procedure: A-FLUTTER ABLATION;  Surgeon: Constance Haw, MD;  Location: [facility];  Service: Cardiovascular;  Laterality: N/A;  . ATRIAL FIBRILLATION ABLATION N/A 05/16/2019   Procedure: ATRIAL  FIBRILLATION ABLATION;  Surgeon: Constance Haw, MD;  Location: [facility];  Service: Cardiovascular;  Laterality: N/A;  . CARDIAC CATHETERIZATION  10/16/2009   nonischemic cardiomyopathy  . CARDIOVERSION N/A 03/25/2015   Procedure: CARDIOVERSION;  Surgeon: Pixie Casino, MD;  Location: [facility];  Service: Cardiovascular;  Laterality: N/A;  . CARDIOVERSION N/A 06/17/2017   Procedure: CARDIOVERSION;  Surgeon:  Sanda Klein, MD;  Location: [facility];  Service: Cardiovascular;  Laterality: N/A;  . CARDIOVERSION N/A 07/08/2017   Procedure: CARDIOVERSION;  Surgeon: Fay Records, MD;  Location[facility];  Service: Cardiovascular;  Laterality: N/A;  . CARDIOVERSION N/A 02/27/2019   Procedure: CARDIOVERSION;  Surgeon: Sanda Klein, MD;  Location: [facility];  Service: Cardiovascular;  Laterality: N/A;  . CARDIOVERSION N/A 08/16/2019   Procedure: CARDIOVERSION;  Surgeon: Sanda Klein, MD;  Location: [facility];  Service: Cardiovascular;  Laterality: N/A;  . METATARSAL OSTEOTOMY Bilateral ~ 1980   removed part of 5th metatarsal to corect curvature of toes   . RIGHT HEART CATH N/A 05/16/2019   Procedure: RIGHT HEART CATH;  Surgeon: Jolaine Artist, MD;  Location: [facility];  Service: Cardiovascular;  Laterality: N/A;  . TEE WITHOUT CARDIOVERSION  05/16/2019   Procedure: Transesophageal Echocardiogram (Tee);  Surgeon: Constance Haw, MD;  Location: [facility];  Service: Cardiovascular;;     Medications Prior to Admission: Prior to Admission medications   Medication Sig Start Date End Date Taking? Authorizing Provider  albuterol (VENTOLIN HFA) 108 (90 Base) MCG/ACT inhaler Inhale 2 puffs into the lungs every 6 (six) hours as needed for wheezing or shortness of breath. 12/16/16   [name], MD  allopurinol (ZYLOPRIM) 300 MG tablet Take 1 tablet by mouth once daily 10/22/19   [name], MD  amiodarone (PACERONE) 200 MG tablet Take 1 tablet (200 mg total) by mouth daily. 08/30/19   Clegg, Amy D, NP  Blood Glucose Monitoring Suppl (ONETOUCH VERIO) w/Device KIT Use to check blood sugar TID as directed.  Dx Code: E11.9 02/22/19   [name], MD  cetirizine (ZYRTEC) 10 MG tablet Take 1 tablet (10 mg total) by mouth daily. 11/01/14   [name], MD  Clobetasol Prop Emollient Base (CLOBETASOL PROPIONATE E) 0.05 % emollient cream Apply 1 application topically 2  (two) times daily as needed (insect bite). 07/25/19   [name], MD  colchicine (COLCRYS) 0.6 MG tablet TAKE ONE TABLET BY MOUTH DAILY AS NEEDED FOR GOUT FLARE UPS. 01/14/16   Lavell Supple, MD  ELIQUIS 5 MG TABS tablet Take 1 tablet by mouth twice daily 02/26/19   Anabel Lykins, MD  fish oil-omega-3 fatty acids 1000 MG capsule Take 1 g by mouth daily.     [provider]  furosemide (LASIX) 80 MG tablet Take 1 tablet (80 mg total) by mouth daily. And 1 tab as needed for swelling or shortness of breath 08/30/19   Clegg, Amy D, NP  glucose blood test strip (Verio) Use as instructed Check blood sugars TID DxE11.9 01/24/19   [name], MD  hydrALAZINE (APRESOLINE) 25 MG tablet Take 1 tablet (25 mg total) by mouth 3 (three) times daily. 05/29/19   Clegg, Amy D, NP  HYDROcodone-acetaminophen (NORCO) 10-325 MG tablet Take 1 tablet by mouth every 8 (eight) hours as needed for moderate pain or severe pain. 08/03/18   [name], MD  hydrocortisone 2.5 % ointment Apply topically 2 (two) times daily.  [provider]  hydrocortisone 2.5 % ointment Apply topically 2 (two) times daily as needed. 09/20/19   [name], MD  isosorbide mononitrate (IMDUR) 30 MG 24 hr tablet Take 1 tablet (30 mg total) by mouth daily. 05/29/19 05/28/20  Clegg, Amy D, NP  LORazepam (ATIVAN) 1 MG tablet Take 1 tablet (1 mg total) by mouth every 8 (eight) hours as needed for anxiety. 07/23/19   [name], MD  magnesium oxide (MAG-OX) 400 MG tablet Take 1 tablet (400 mg total) by mouth 2 (two) times daily. 07/20/19   Mandee Pluta, MD  metoprolol succinate (TOPROL-XL) 25 MG 24 hr tablet Take 0.5 tablets (12.5 mg total) by mouth 2 (two) times daily. 09/12/19   Clegg, Amy D, NP  Multiple Vitamin (MULTIVITAMIN WITH MINERALS) TABS tablet Take 1 tablet by mouth daily.     [provider]  naproxen (NAPROSYN) 375 MG tablet Take 375 mg by mouth 2 (two) times daily as needed (knee pain.).      [provider]  potassium chloride SA (KLOR-CON) 20 MEQ tablet TAKE 1 TABLET BY MOUTH ONCE DAILY AND  AN  EXTRA  TABLET  IF  WEIGHT  IS  ABOVE  210  LBS 10/17/19   Savion Washam, MD  pravastatin (PRAVACHOL) 40 MG tablet Take 1 tablet by mouth once daily 02/26/19   Hilty, Nadean Corwin, MD  tamsulosin (FLOMAX) 0.4 MG CAPS capsule Take 1 capsule by mouth once daily 06/25/19   Clegg, Amy D, NP  temazepam (RESTORIL) 30 MG capsule Take 1 capsule (30 mg total) by mouth at bedtime. 07/23/19   [name], MD     Allergies:    Allergies  Allergen Reactions  . Sulfonamide Derivatives Other (See Comments)    unknown reaction.  Patient states had to go to the ER.   [name] Zolpidem Other (See Comments)    Excessive, prolonged sedation    Social History:   Social History   Socioeconomic History  . Marital status: Married    Spouse name: Not on file  . Number of children: 4  . Years of education: 34  . Highest education level: High school graduate  Occupational History  . Occupation: retired    Fish farm manager: [employer]  Tobacco Use  . Smoking status: Former Smoker    Packs/day: 1.00    Years: 10.00    Pack years: 10.00    Quit date: 06/07/1980    Years since quitting: 39.4  . Smokeless tobacco: Never Used  Substance and Sexual Activity  . Alcohol use: No  . Drug use: No  . Sexual activity: Not Currently  Other Topics Concern  . Not on file  Social History Narrative   Lives with male partner in a one story home.  His son lives there off and on.  Retired from [employer].  Education: high school.  Right handed   Social Determinants of Health   Financial Resource Strain:   . Difficulty of Paying Living Expenses:   Food Insecurity:   . Worried About Charity fundraiser in the Last Year:   . Arboriculturist in the Last Year:   Transportation Needs:   . Film/video editor (Medical):   [name] Lack of Transportation (Non-Medical):   Physical Activity:   . Days of Exercise per Week:   .  Minutes of Exercise per Session:   Stress:   . Feeling of Stress :   Social Connections:   . Frequency  of Communication with Friends and Family:   . Frequency of Social Gatherings with Friends and Family:   . Attends Religious Services:   . Active Member of Clubs or Organizations:   . Attends Archivist Meetings:   [name] Marital Status:   Intimate Partner Violence:   . Fear of Current or Ex-Partner:   . Emotionally Abused:   [name] Physically Abused:   . Sexually Abused:     Family History:   The patient's family history includes Aneurysm in his sister; Cancer in his father; Clotting disorder in his mother; Diabetes in his mother and sister; Heart attack in his mother; Heart disease in his mother; Hyperlipidemia in his mother; Hypertension in his mother and sister; Leukemia in his maternal grandmother; Lung disease in his father; Other in his sister; Seizures in his sister; Stroke in his mother.    ROS:  Please see the history of present illness.  All other ROS reviewed and negative.     Physical Exam/Data:   Vitals:   11/08/19 1135  BP: 125/67  Pulse: 79  Resp: 16  Weight: 188 lb 9.6 oz (85.5 kg)  Height: _0  (1.803 m)   _1 @ Last 3 Weights 11/08/2019 09/20/2019 09/20/2019  Weight (lbs) 188 lb 9.6 oz 195 lb 9.6 oz 196 lb  Weight (kg) 85.548 kg 88.724 kg 88.905 kg     Body mass index is 26.3 kg/m.  General:  Well nourished, well developed, in no acute distress HEENT: normal Lymph: no adenopathy Neck: no JVD Endocrine:  No thryomegaly Vascular: No carotid bruits; FA pulses 2+ bilaterally without bruits  Cardiac:  normal S1, S2; RRR; grade 3/6 early peaking systolic ejection murmur heard best at the right upper sternal border, grade 2-3/6 decrescendo murmur at the left lower sternal border. Lungs:  clear to auscultation bilaterally, no wheezing, rhonchi or rales  Abd: soft, nontender, no hepatomegaly  Ext: no edema Musculoskeletal:  No deformities, BUE and BLE  strength normal and equal Skin: warm and dry  Neuro:  CNs 2-12 intact, no focal abnormalities noted Psych:  Normal affect    EKG:  The ECG that was done 09/20/2019 was personally reviewed and demonstrates sinus rhythm with first-degree AV block and nonspecific ST-T-segment changes.  Borderline low voltage.  Relevant CV Studies: 07/20/2019 echocardiogram 1. Left ventricular ejection fraction, by estimation, is 35 to 40%. The  left ventricle has moderately decreased function. The left ventricle  demonstrates global hypokinesis. There is moderately increased left  ventricular hypertrophy. Left ventricular  diastolic function could not be evaluated.  2. Right ventricular systolic function is low normal. The right  ventricular size is mildly enlarged. There is severely elevated pulmonary  artery systolic pressure.  3. Left atrial size was severely dilated.  4. The mitral valve is abnormal. Mild mitral valve regurgitation.  5. The aortic valve is tricuspid. Aortic valve regurgitation is mild to  moderate. Mild aortic valve sclerosis is present, with no evidence of  aortic valve stenosis.  6. Aortic dilatation noted. There is moderate dilatation of the ascending  aorta measuring 43 mm.  7. The inferior vena cava is dilated in size with <50% respiratory  variability, suggesting right atrial pressure of 15 mmHg.   Follow-up echocardiogram scheduled for December 12, 2019  Laboratory Data:  High Sensitivity Troponin:  No results for input(s): TROPONINIHS in the last 720 hours.    ChemistryNo results for input(s): NA, K, CL, CO2, GLUCOSE, BUN, CREATININE, CALCIUM, GFRNONAA, GFRAA, ANIONGAP in the last 168  hours.  No results for input(s): PROT, ALBUMIN, AST, ALT, ALKPHOS, BILITOT in the last 168 hours. HematologyNo results for input(s): WBC, RBC, HGB, HCT, MCV, MCH, MCHC, RDW, PLT in the last 168 hours. BNPNo results for input(s): BNP, PROBNP in the last 168 hours.  DDimer No results for  input(s): DDIMER in the last 168 hours.   Radiology/Studies:  No results found.   Assessment and Plan:   1. CHF (combined systolic and diastolic):  As has always been his pattern, as long as he is able to maintain sinus rhythm his heart failure is very well compensated..  Not on RAAS inhibitors due to renal dysfunction, but receiving hydralazine/nitrates metoprolol and loop diuretics.  Appears clinically euvolemic on current medications.  Has not required recent diuretic dose increases or adjustments. 2. AFib:  Maintaining normal rhythm for the most part on oral amiodarone.  Has occasional episodes of what is likely atypical atrial flutter.  Had successful cavotricuspid isthmus ablation.  No bleeding complications,compliant with apixaban anticoagulation. CHADSVAsc 4 (age, HTN, CHF, history of DM). 3. Moderate AI: As before, his aortic insufficiency murmur is remarkably loud, but he does not have a dilated left ventricle and the most recent echo described the aortic insufficiency has only mild-moderate.  Continue to monitor periodically.  Repeat echo scheduled for July 2021 4. OSA: 100% compliant with CPAP with excellent clinical benefit. 5.  Overweight: Committed to losing weight, slowly getting better. 6. HTN:  Usually with excellent control 8.  Amiodarone: Due for repeat labs in July 2021. He had normal liver function tests in February and normal TSH in January 2021.  Needs yearly ophthalmological exam, avoidance of sun exposure.  Needs to promptly report unexplained respiratory symptoms. 9. CKD 3b: Most recent creatinine level was a little worse at 2.15.  GFR is around 35-40. 10. DM: No longer has diabetes after weight loss.  He is to continue to avoid excessive carbohydrates. 11. ILR: Implanted as part of ALLEVIATE HF clinical heart failure trial, but also be very useful for monitoring of arrhythmia recurrence.  Device interrogated 11/04/2019.   Transitioning today to the active intervention  arm of the ALLEVIATE HF trial.  Therapeutic intervention plan to be drawn out.  For questions or updates, please contact [place] Please consult [email or web address] for contact info under        Signed, Sanda Klein, MD  11/08/2019 11:37 AM

## 2019-11-12 ENCOUNTER — Other Ambulatory Visit: Payer: Self-pay

## 2019-11-12 ENCOUNTER — Ambulatory Visit (HOSPITAL_COMMUNITY)
Admission: RE | Admit: 2019-11-12 | Discharge: 2019-11-12 | Disposition: A | Discharge status: Home / Self Care | Payer: Medicare Other | Source: Ambulatory Visit | Attending: Internal Medicine | Admitting: Internal Medicine

## 2019-11-12 ENCOUNTER — Encounter (HOSPITAL_COMMUNITY): Payer: Self-pay | Admitting: Internal Medicine

## 2019-11-12 VITALS — BP 120/68 | HR 74 | Wt 189.0 lb

## 2019-11-12 DIAGNOSIS — I4819 Other persistent atrial fibrillation: Secondary | ICD-10-CM | POA: Diagnosis not present

## 2019-11-12 DIAGNOSIS — E669 Obesity, unspecified: Secondary | ICD-10-CM | POA: Insufficient documentation

## 2019-11-12 DIAGNOSIS — I4891 Unspecified atrial fibrillation: Secondary | ICD-10-CM | POA: Diagnosis not present

## 2019-11-12 DIAGNOSIS — I130 Hypertensive heart and chronic kidney disease with heart failure and stage 1 through stage 4 chronic kidney disease, or unspecified chronic kidney disease: Secondary | ICD-10-CM | POA: Insufficient documentation

## 2019-11-12 DIAGNOSIS — I480 Paroxysmal atrial fibrillation: Secondary | ICD-10-CM | POA: Diagnosis not present

## 2019-11-12 DIAGNOSIS — Z87891 Personal history of nicotine dependence: Secondary | ICD-10-CM | POA: Insufficient documentation

## 2019-11-12 DIAGNOSIS — Z7901 Long term (current) use of anticoagulants: Secondary | ICD-10-CM | POA: Insufficient documentation

## 2019-11-12 DIAGNOSIS — Z006 Encounter for examination for normal comparison and control in clinical research program: Secondary | ICD-10-CM

## 2019-11-12 DIAGNOSIS — I5022 Chronic systolic (congestive) heart failure: Secondary | ICD-10-CM | POA: Diagnosis not present

## 2019-11-12 DIAGNOSIS — N1832 Chronic kidney disease, stage 3b: Secondary | ICD-10-CM | POA: Diagnosis not present

## 2019-11-12 DIAGNOSIS — E785 Hyperlipidemia, unspecified: Secondary | ICD-10-CM | POA: Diagnosis not present

## 2019-11-12 DIAGNOSIS — Z8249 Family history of ischemic heart disease and other diseases of the circulatory system: Secondary | ICD-10-CM | POA: Diagnosis not present

## 2019-11-12 DIAGNOSIS — I351 Nonrheumatic aortic (valve) insufficiency: Secondary | ICD-10-CM | POA: Diagnosis not present

## 2019-11-12 DIAGNOSIS — E1122 Type 2 diabetes mellitus with diabetic chronic kidney disease: Secondary | ICD-10-CM | POA: Insufficient documentation

## 2019-11-12 DIAGNOSIS — G4733 Obstructive sleep apnea (adult) (pediatric): Secondary | ICD-10-CM | POA: Diagnosis not present

## 2019-11-12 DIAGNOSIS — N179 Acute kidney failure, unspecified: Secondary | ICD-10-CM | POA: Diagnosis not present

## 2019-11-12 DIAGNOSIS — Z79899 Other long term (current) drug therapy: Secondary | ICD-10-CM | POA: Diagnosis not present

## 2019-11-12 DIAGNOSIS — E1142 Type 2 diabetes mellitus with diabetic polyneuropathy: Secondary | ICD-10-CM | POA: Insufficient documentation

## 2019-11-12 DIAGNOSIS — Z7984 Long term (current) use of oral hypoglycemic drugs: Secondary | ICD-10-CM | POA: Insufficient documentation

## 2019-11-12 DIAGNOSIS — R000 Tachycardia, unspecified: Secondary | ICD-10-CM | POA: Insufficient documentation

## 2019-11-12 LAB — BASIC METABOLIC PANEL
Anion gap: 9 (ref 5–15)
BUN: 43 mg/dL — ABNORMAL HIGH (ref 8–23)
CO2: 25 mmol/L (ref 22–32)
Calcium: 9.1 mg/dL (ref 8.9–10.3)
Chloride: 105 mmol/L (ref 98–111)
Creatinine, Ser: 2.02 mg/dL — ABNORMAL HIGH (ref 0.61–1.24)
GFR calc Af Amer: 39 mL/min — ABNORMAL LOW (ref >60)
GFR calc non Af Amer: 33 mL/min — ABNORMAL LOW (ref >60)
Glucose, Bld: 136 mg/dL — ABNORMAL HIGH (ref 70–99)
Potassium: 4.3 mmol/L (ref 3.5–5.1)
Sodium: 139 mmol/L (ref 135–145)

## 2019-11-12 LAB — CBC
HCT: 36.9 % — ABNORMAL LOW (ref 39.0–52.0)
Hemoglobin: 10.6 g/dL — ABNORMAL LOW (ref 13.0–17.0)
MCH: 24.4 pg — ABNORMAL LOW (ref 26.0–34.0)
MCHC: 28.7 g/dL — ABNORMAL LOW (ref 30.0–36.0)
MCV: 85 fL (ref 80.0–100.0)
Platelets: 208 10*3/uL (ref 150–400)
RBC: 4.34 MIL/uL (ref 4.22–5.81)
RDW: 16.6 % — ABNORMAL HIGH (ref 11.5–15.5)
WBC: 4 10*3/uL (ref 4.0–10.5)
nRBC: 0 % (ref 0.0–0.2)

## 2019-11-12 LAB — BRAIN NATRIURETIC PEPTIDE: B Natriuretic Peptide: 344.4 pg/mL — ABNORMAL HIGH (ref 0.0–100.0)

## 2019-11-12 MED ORDER — DAPAGLIFLOZIN PROPANEDIOL 10 MG PO TABS: 10.0000 mg | ORAL_TABLET | Freq: Every day | ORAL | 6 refills | Status: DC

## 2019-11-12 NOTE — Research (Signed)
Alleviate Research Study LINQ Transmission

## 2019-11-12 NOTE — Addendum Note (Signed)
Encounter addended by: Scarlette Calico, RN on: 11/12/2019 10:46 AM  Actions taken: Visit diagnoses modified, Pharmacy for encounter modified, Order list changed, Diagnosis association updated, Clinical Note Signed, Charge Capture section accepted

## 2019-11-12 NOTE — Progress Notes (Signed)
Advanced Heart Failure Clinic Note   Referring Physician: PCP: [name], MD PCP-Cardiologist: Sanda Klein, MD  [facility]: Dr. 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  Admitted 05/16/19 for PVI with successful procedure. Post procedure he developed cardiogenic shock and respiratory failure. CCM consulted for intubation. Advanced HF team consulted. As he improved he was extubated.  Treated with pressors and gradually weaned off. HF meds initiated but unable to treat w/ ARB/ARNI nor spiro due to AKI, SCr peaking to 2.2. Tolerated Bidil and digoxin. ECHO initially showed EF 10% but improved 35-40% prior to discharge. Was discharged home on 05/29/19.   Had Linq placed 05/15/20 part of the ALLEIVATE HF Trial.   He went back in A fib 08/02/19. Saw pulmonary on 08/02/19 and was noted to have many central events. CPAP was to be changed to auto titration.  On 08/16/19 he had successful cardioversion for afib.   Had recent clinic f/u on 3/25 and was volume overloaded and symptomatic, NYHA IIIb. ReDs clip was 52%. Lasix was changed from 3x/week to daily.   He presents back for f/u. Recently saw Dr. Bonne Dolores for the Alleviate study and download 11/03/29 showed occasional brief episodes of probable atypical AFL. No sustained events.  Feels good. "In [place]". Can do all his activities as needed. Several weeks ago built a deck on back of his house. Did fine but was worn out for a few days. No edema, orthopnea or PND.   [name] Past Medical History:  Diagnosis Date  . AORTIC STENOSIS, MODERATE 12/05/2009  . Atrial fibrillation ([place])   . Atrial fibrillation with RVR ([place]) 07/05/2017  . Atrial flutter ([place]) 12/05/2009  . Atypical chest pain 11/08/2011  . BENIGN PROSTATIC HYPERTROPHY, HX OF 12/05/2009  . CHEST PAIN-UNSPECIFIED 11/11/2009  . CHF 12/05/2009  . Chronic renal insufficiency 12/16/2016  . Debility 04/18/2012  .  Diarrhea 12/16/2016  . DYSPNEA 12/19/2009  . Edema 04/18/2012  . ERECTILE DYSFUNCTION 01/25/2007  . FATIGUE, CHRONIC 11/11/2009  . Gout 07/10/2012  . Heart murmur   . History of kidney stones    "they passed" (07/05/2017)  . Hyperkalemia 04/18/2012  . Hyperlipidemia   . Hypertension   . HYPERTENSION 01/25/2007  . Hypoxia 05/05/2011  . Insomnia 05/13/2016  . INSOMNIA, HX OF 01/25/2007  . Knee pain, bilateral 12/28/2010  . Knee pain, right 12/28/2010  . NEUROMA 01/05/2010  . OBESITY NOS 01/25/2007  . OSA on CPAP 04/06/2010  . PERIPHERAL NEUROPATHY 01/05/2010  . PULMONARY FUNCTION TESTS, ABNORMAL 02/02/2010  . Superficial thrombophlebitis of left leg 09/07/2011  . TESTICULAR HYPOFUNCTION 01/05/2010  . 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 Toe pain, right 07/10/2012  . Type II diabetes mellitus ([place]) 12/05/2009  . UNSPECIFIED ANEMIA 01/05/2010    Current Outpatient Medications  Medication Sig Dispense Refill  . albuterol (VENTOLIN HFA) 108 (90 Base) MCG/ACT inhaler Inhale 2 puffs into the lungs every 6 (six) hours as needed for wheezing or shortness of breath. 1 Inhaler 6  . allopurinol (ZYLOPRIM) 300 MG tablet Take 1 tablet by mouth once daily 90 tablet 0  . amiodarone (PACERONE) 200 MG tablet Take 1 tablet (200 mg total) by mouth daily. 90 tablet 1  . Blood Glucose Monitoring Suppl (ONETOUCH VERIO) w/Device KIT Use to check blood sugar TID as directed.  Dx Code: E11.9 1 kit 0  . calcium carbonate (OS-CAL) 1250 (500 Ca) MG  chewable tablet Chew by mouth.    . cetirizine (ZYRTEC) 10 MG tablet Take 1 tablet (10 mg total) by mouth daily. 30 tablet 11  . Clobetasol Prop Emollient Base (CLOBETASOL PROPIONATE E) 0.05 % emollient cream Apply 1 application topically 2 (two) times daily as needed (insect bite). 60 g 1  . colchicine (COLCRYS) 0.6 MG tablet TAKE ONE TABLET BY MOUTH DAILY AS NEEDED FOR GOUT FLARE UPS. 30 tablet 0  . ELIQUIS 5 MG TABS tablet Take 1 tablet by mouth twice daily 60 tablet 10  . fish oil-omega-3  fatty acids 1000 MG capsule Take 1 g by mouth daily.     . furosemide (LASIX) 80 MG tablet Take 1 tablet (80 mg total) by mouth daily. And 1 tab as needed for swelling or shortness of breath 30 tablet 6  . glucose blood test strip (Verio) Use as instructed Check blood sugars TID DxE11.9 100 each 12  . glyBURIDE (DIABETA) 5 MG tablet Take 5 mg by mouth as needed.     . hydrALAZINE (APRESOLINE) 25 MG tablet Take 1 tablet (25 mg total) by mouth 3 (three) times daily. 90 tablet 6  . HYDROcodone-acetaminophen (NORCO) 10-325 MG tablet Take 1 tablet by mouth every 8 (eight) hours as needed for moderate pain or severe pain. 90 tablet 0  . hydrocortisone 2.5 % ointment Apply topically 2 (two) times daily as needed. 30 g 1  . isosorbide mononitrate (IMDUR) 30 MG 24 hr tablet Take 1 tablet (30 mg total) by mouth daily. 30 tablet 11  . LORazepam (ATIVAN) 1 MG tablet Take 1 tablet (1 mg total) by mouth every 8 (eight) hours as needed for anxiety. 30 tablet 5  . magnesium oxide (MAG-OX) 400 MG tablet Take 1 tablet (400 mg total) by mouth 2 (two) times daily. 30 tablet 11  . metoprolol succinate (TOPROL-XL) 25 MG 24 hr tablet Take 0.5 tablets (12.5 mg total) by mouth 2 (two) times daily. 90 tablet 3  . Multiple Vitamin (MULTIVITAMIN WITH MINERALS) TABS tablet Take 1 tablet by mouth daily.     . naproxen (NAPROSYN) 375 MG tablet Take 375 mg by mouth 2 (two) times daily as needed (knee pain.).     [name] potassium chloride SA (KLOR-CON) 20 MEQ tablet TAKE 1 TABLET BY MOUTH ONCE DAILY AND  AN  EXTRA  TABLET  IF  WEIGHT  IS  ABOVE  210  LBS 180 tablet 1  . pravastatin (PRAVACHOL) 40 MG tablet Take 1 tablet by mouth once daily 90 tablet 3  . tamsulosin (FLOMAX) 0.4 MG CAPS capsule Take 1 capsule by mouth once daily 30 capsule 0  . temazepam (RESTORIL) 30 MG capsule Take 1 capsule (30 mg total) by mouth at bedtime. 30 capsule 5   No current facility-administered medications for this encounter.    Allergies  Allergen  Reactions  . Sulfonamide Derivatives Other (See Comments)    unknown reaction.  Patient states had to go to the ER.   [name] Zolpidem Other (See Comments)    Excessive, prolonged sedation      Social History   Socioeconomic History  . Marital status: Married    Spouse name: Not on file  . Number of children: 4  . Years of education: 20  . Highest education level: High school graduate  Occupational History  . Occupation: retired    Fish farm manager: [employer]  Tobacco Use  . Smoking status: Former Smoker    Packs/day: 1.00    Years: 10.00  Pack years: 10.00    Quit date: 06/07/1980    Years since quitting: 39.4  . Smokeless tobacco: Never Used  Substance and Sexual Activity  . Alcohol use: No  . Drug use: No  . Sexual activity: Not Currently  Other Topics Concern  . Not on file  Social History Narrative   Lives with male partner in a one story home.  His son lives there off and on.  Retired from [employer].  Education: high school.  Right handed   Social Determinants of Health   Financial Resource Strain:   . Difficulty of Paying Living Expenses:   Food Insecurity:   . Worried About Charity fundraiser in the Last Year:   . Arboriculturist in the Last Year:   Transportation Needs:   . Film/video editor (Medical):   [name] Lack of Transportation (Non-Medical):   Physical Activity:   . Days of Exercise per Week:   . Minutes of Exercise per Session:   Stress:   . Feeling of Stress :   Social Connections:   . Frequency of Communication with Friends and Family:   . Frequency of Social Gatherings with Friends and Family:   . Attends Religious Services:   . Active Member of Clubs or Organizations:   . Attends Archivist Meetings:   [name] Marital Status:   Intimate Partner Violence:   . Fear of Current or Ex-Partner:   . Emotionally Abused:   [name] Physically Abused:   . Sexually Abused:       Family History  Problem Relation Age of Onset  . Clotting disorder Mother     . Heart disease Mother        s/p MI  . Heart attack Mother   . Hypertension Mother   . Diabetes Mother   . Hyperlipidemia Mother   . Stroke Mother   . Cancer Father        ? lung  . Lung disease Father        smoker  . Diabetes Sister   . Hypertension Sister        smoker  . Leukemia Maternal Grandmother        ?  [name] Aneurysm Sister        brain  . Other Sister        clipped  . Seizures Sister        d/o w/aneurysm/ smoker    Vitals:   11/12/19 0954  BP: 120/68  Pulse: 74  SpO2: 99%  Weight: 85.7 kg (189 lb)    Wt Readings from Last 3 Encounters:  11/12/19 85.7 kg (189 lb)  11/08/19 85.5 kg (188 lb 9.6 oz)  09/20/19 88.7 kg (195 lb 9.6 oz)    PHYSICAL EXAM: General:  Well appearing. No resp difficulty HEENT: normal Neck: supple. no JVD. Carotids 2+ bilat; no bruits. No lymphadenopathy or thryomegaly appreciated. Cor: PMI nondisplaced. Regular rate & rhythm. 3/6 AI Lungs: clear Abdomen: soft, nontender, nondistended. No hepatosplenomegaly. No bruits or masses. Good bowel sounds. Extremities: no cyanosis, clubbing, rash, edema Neuro: alert & orientedx3, cranial nerves grossly intact. moves all 4 extremities w/o difficulty. Affect pleasant     EKG: NSR w/ PVCs 80 bpm  ASSESSMENT & PLAN: 1. 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  EF 10% by TEE 12/9 likely due to tachy CM  Initially on pressors after PVI.  - Limited ECHO completed on 05/28/19 and showed EF improving with SR, 40-45%.  - ECHO 07/20/19: EF 35-40%  - Had Linq  placement. - In ALLEVIATE HF  - Volume status stable on lasix 80 daily. - GDMT limited by CKD. No ARB/ ARNi, spiro nor digoxin - Continue Toprol 12.5 bid  - Continue Hydralazine 25 tid + Imdur 30 daily. Do not want to drop BP too much with CKD 3b - With advanced renal disease, we have been hesitant to titrate meds  much further given soft BP - Will Farxiga for HF and CKD. May need to cut back lasix. Check labs today and in 2 weeks.  - Due for repeat echo next month    2. Persistent AF - s/p successful PVI 12/9. Has LINQ - Tikosyn off due to AKI -S/P Cardioversion on 08/16/19 with conversion to NSR - Maintaining NSR on Alleviate device - Continue amiodarone to 200 mg daily. EP dosing.  - Continue Eliquis.  - Continue Toprol XL   3. CKD 3b - Most recent creatinine was up to 2.15 on 09/18/19 - Will repeat labs today - Follows with Nephrology in [place]  4. OSA - Compliant w/Bipap  5. DM2 - last HgBa1c 5.3%  6. Aortic insufficiency - moderate - will reassess degree of AI as well as LV size/function on f/u echo   [name], MD 11/12/19

## 2019-11-12 NOTE — Patient Instructions (Signed)
Start Farxiga 10 mg daily  Labs done today, your results will be available in MyChart, we will contact you for abnormal readings.  Labs needed in 2 weeks  Please call us in December to schedule your 6 month follow up  If you have any questions or concerns before your next appointment please send [place] a message through [place] or call our office at [phone].    TO LEAVE A MESSAGE FOR THE NURSE SELECT OPTION 2, PLEASE LEAVE A MESSAGE INCLUDING: . YOUR NAME . DATE OF BIRTH . CALL BACK NUMBER . REASON FOR CALL**this is important as we prioritize the call backs  [place] AS LONG AS YOU CALL BEFORE 4:00 PM  At the [facility], you and your health needs are our priority. As part of our continuing mission to provide you with exceptional heart care, we have created designated Provider Care Teams. These Care Teams include your primary Cardiologist (physician) and Advanced Practice Providers (APPs- Physician Assistants and Nurse Practitioners) who all work together to provide you with the care you need, when you need it.   You may see any of the following providers on your designated Care Team at your next follow up: [name] Dr [name] . Dr Loralie Champagne . Darrick Grinder, NP . Lyda Jester, PA . Audry Riles, PharmD   Please be sure to bring in all your medications bottles to every appointment.

## 2019-11-26 ENCOUNTER — Other Ambulatory Visit: Payer: Self-pay

## 2019-11-26 ENCOUNTER — Ambulatory Visit (HOSPITAL_COMMUNITY)
Admission: RE | Admit: 2019-11-26 | Discharge: 2019-11-26 | Disposition: A | Discharge status: Home / Self Care | Payer: Medicare Other | Source: Ambulatory Visit | Attending: Internal Medicine | Admitting: Internal Medicine

## 2019-11-26 DIAGNOSIS — I5022 Chronic systolic (congestive) heart failure: Secondary | ICD-10-CM | POA: Insufficient documentation

## 2019-11-26 LAB — BASIC METABOLIC PANEL
Anion gap: 10 (ref 5–15)
BUN: 34 mg/dL — ABNORMAL HIGH (ref 8–23)
CO2: 24 mmol/L (ref 22–32)
Calcium: 9.1 mg/dL (ref 8.9–10.3)
Chloride: 105 mmol/L (ref 98–111)
Creatinine, Ser: 2.16 mg/dL — ABNORMAL HIGH (ref 0.61–1.24)
GFR calc Af Amer: 36 mL/min — ABNORMAL LOW (ref >60)
GFR calc non Af Amer: 31 mL/min — ABNORMAL LOW (ref >60)
Glucose, Bld: 138 mg/dL — ABNORMAL HIGH (ref 70–99)
Potassium: 4.7 mmol/L (ref 3.5–5.1)
Sodium: 139 mmol/L (ref 135–145)

## 2019-11-26 LAB — BRAIN NATRIURETIC PEPTIDE: B Natriuretic Peptide: 349.6 pg/mL — ABNORMAL HIGH (ref 0.0–100.0)

## 2019-12-11 ENCOUNTER — Other Ambulatory Visit: Payer: Self-pay

## 2019-12-11 DIAGNOSIS — Z006 Encounter for examination for normal comparison and control in clinical research program: Secondary | ICD-10-CM

## 2019-12-11 MED ORDER — POTASSIUM CHLORIDE CRYS ER 20 MEQ PO TBCR: 20.0000 meq | EXTENDED_RELEASE_TABLET | ORAL | 1 refills | Status: DC

## 2019-12-11 MED ORDER — FUROSEMIDE 80 MG PO TABS: 80.0000 mg | ORAL_TABLET | Freq: Every day | ORAL | 6 refills | Status: DC

## 2019-12-11 MED ORDER — FUROSEMIDE 80 MG PO TABS: 80.0000 mg | ORAL_TABLET | Freq: Every day | ORAL | 3 refills | Status: DC | PRN

## 2019-12-11 NOTE — Research (Signed)
Alleviate HF Research Study  Carelink transmission downloaded. No new events at this time, will continue to monitor.

## 2019-12-11 NOTE — Progress Notes (Signed)
Updated order per Alleviate HF Research protocol.

## 2019-12-11 NOTE — Progress Notes (Signed)
Updated order per Alleviate HF Research trial.

## 2019-12-12 ENCOUNTER — Ambulatory Visit (HOSPITAL_COMMUNITY): Payer: Medicare HMO

## 2019-12-12 ENCOUNTER — Other Ambulatory Visit: Payer: Self-pay

## 2019-12-12 ENCOUNTER — Ambulatory Visit (HOSPITAL_COMMUNITY)
Admission: RE | Admit: 2019-12-12 | Discharge: 2019-12-12 | Disposition: A | Discharge status: Home / Self Care | Payer: Medicare HMO | Source: Ambulatory Visit | Attending: Cardiovascular Disease | Admitting: Cardiovascular Disease

## 2019-12-12 DIAGNOSIS — E785 Hyperlipidemia, unspecified: Secondary | ICD-10-CM | POA: Diagnosis not present

## 2019-12-12 DIAGNOSIS — E119 Type 2 diabetes mellitus without complications: Secondary | ICD-10-CM | POA: Diagnosis not present

## 2019-12-12 DIAGNOSIS — I351 Nonrheumatic aortic (valve) insufficiency: Secondary | ICD-10-CM | POA: Diagnosis not present

## 2019-12-12 DIAGNOSIS — I509 Heart failure, unspecified: Secondary | ICD-10-CM | POA: Diagnosis not present

## 2019-12-12 DIAGNOSIS — G4730 Sleep apnea, unspecified: Secondary | ICD-10-CM | POA: Insufficient documentation

## 2019-12-12 DIAGNOSIS — I719 Aortic aneurysm of unspecified site, without rupture: Secondary | ICD-10-CM | POA: Insufficient documentation

## 2019-12-12 DIAGNOSIS — I4891 Unspecified atrial fibrillation: Secondary | ICD-10-CM | POA: Insufficient documentation

## 2019-12-12 NOTE — Progress Notes (Signed)
Echocardiogram 2D Echocardiogram has been performed.  Oneal Deputy Zaylin Runco 12/12/2019, 12:01 PM

## 2019-12-13 DIAGNOSIS — G4733 Obstructive sleep apnea (adult) (pediatric): Secondary | ICD-10-CM | POA: Diagnosis not present

## 2019-12-14 ENCOUNTER — Other Ambulatory Visit: Payer: Self-pay

## 2019-12-14 ENCOUNTER — Ambulatory Visit (INDEPENDENT_AMBULATORY_CARE_PROVIDER_SITE_OTHER): Payer: Medicare HMO | Admitting: Internal Medicine

## 2019-12-14 ENCOUNTER — Encounter: Payer: Self-pay | Admitting: Internal Medicine

## 2019-12-14 VITALS — BP 122/70 | HR 72 | Ht 71.0 in | Wt 182.4 lb

## 2019-12-14 DIAGNOSIS — E1169 Type 2 diabetes mellitus with other specified complication: Secondary | ICD-10-CM

## 2019-12-14 DIAGNOSIS — E782 Mixed hyperlipidemia: Secondary | ICD-10-CM

## 2019-12-14 DIAGNOSIS — E669 Obesity, unspecified: Secondary | ICD-10-CM | POA: Diagnosis not present

## 2019-12-14 DIAGNOSIS — E663 Overweight: Secondary | ICD-10-CM

## 2019-12-14 LAB — POCT GLYCOSYLATED HEMOGLOBIN (HGB A1C): Hemoglobin A1C: 6.6 % — AB (ref 4.0–5.6)

## 2019-12-14 NOTE — Patient Instructions (Addendum)
You may take glyburide half a tablet before a meal before a meal with fried food or dessert  Check sugars once a day, rotating check times.  Please return in 4 months with your sugar log.

## 2019-12-14 NOTE — Progress Notes (Signed)
Patient ID: [name], male   DOB: 22-Oct-1950, 69 y.o.   MRN: [id number]  This visit occurred during the SARS-CoV-2 public health emergency.  Safety protocols were in place, including screening questions prior to the visit, additional usage of staff PPE, and extensive cleaning of exam room while observing appropriate contact time as indicated for disinfecting solutions.   HPI: [name] is a 69 y.o.-year-old male, initially referred by his cardiologist, Dr. Sallyanne Kuster, returning for follow-up DM2, dx in 2011, non-insulin-dependent, uncontrolled, with complications (CHF, CKD, ED).  Last visit 3 months ago.  He reduced carbs since last OV.  He lost 14 lbs since then!  He is very happy with this.  He just rejoined the [facility] and plans to restart exercise.  Reviewed HbA1c levels: Lab Results  Component Value Date   HGBA1C 5.3 09/14/2019   HGBA1C 6.4 06/11/2019   HGBA1C 5.8 02/06/2019   HGBA1C 5.7 08/03/2018   HGBA1C 7.0 (H) 05/02/2018   HGBA1C 6.7 (H) 01/24/2018   HGBA1C 6.2 10/21/2017   HGBA1C 7.2 (H) 03/18/2017   HGBA1C 7.0 (H) 11/15/2016   HGBA1C 7.5 (H) 08/20/2016   HGBA1C 6.8 (H) 05/21/2016   HGBA1C 6.7 (H) 02/18/2016   HGBA1C 6.4 11/17/2015   HGBA1C 6.4 07/15/2015   HGBA1C 6.6 (H) 04/14/2015   HGBA1C 6.1 01/07/2015   HGBA1C 6.8 (H) 09/13/2014   HGBA1C 6.8 (H) 06/13/2014   HGBA1C 6.6 (H) 01/09/2014   HGBA1C 6.4 (H) 10/08/2013   HGBA1C 7.0 (H) 07/11/2013   HGBA1C 6.6 (H) 01/31/2013   HGBA1C 5.9 (H) 10/31/2012   HGBA1C 6.8 (H) 06/22/2012   HGBA1C 6.7 (H) 03/08/2012   HGBA1C 6.6 (H) 11/04/2011   HGBA1C 7.3 (H) 07/22/2011   HGBA1C 8.3 (H) 03/24/2011   HGBA1C 7.1 (H) 12/14/2010   HGBA1C (H) 09/16/2010    7.6 (NOTE)                                                                       According to the ADA Clinical Practice Recommendations for 2011, when HbA1c is used as a screening test:   >=6.5%   Diagnostic of Diabetes Mellitus           (if abnormal result  is  confirmed)  5.7-6.4%   Increased risk of developing Diabetes Mellitus  References:Diagnosis and Classification of Diabetes Mellitus,Diabetes Care,2011,34(Suppl 1):S62-S69 and Standards of Medical Care in         Diabetes - 2011,Diabetes Care,2011,34  (Suppl 1):S11-S61.   Pt is on a regimen of: - Glyburide 5 >> 2.5 mg after b'fast and 0-2.5 mg 2h after dinner >> 0.5 tab as needed before a large meal - only took this 1 He was on Metformin in the past.  Pt checks his sugars 1x a day - ave last 90s: 134: - am: 60-94 >> 130-140 - 2h after b'fast: n/c - before lunch: n/c - 2h after lunch: n/c - before dinner: n/c - 2h after dinner: n/c - bedtime: 68-155, 176, 376  >> 154, 157, 200 - nighttime: n/c Lowest sugar was 60 >> 100; he has hypoglycemia awareness in the 50s.  Highest sugar was 376 (cheese cake) >> 200 (pasta)  Glucometer: One Touch verio IQ  Pt's meals are: -  Breakfast: oatmeal + berries/apples + cinnamon, cereal, fruit - Lunch: light meal: ensure, fruit - Dinner: chicken or fish, ground [place] + veggies, occasionally fried foods (1x a mo) - Snacks: cranberry juice, grape juice, aple juice >> diluted by at least 50%;   -+ CKD- sees Dr. Wynelle Link, last BUN/creatinine:  Lab Results  Component Value Date   BUN 34 (H) 11/26/2019   BUN 43 (H) 11/12/2019   CREATININE 2.16 (H) 11/26/2019   CREATININE 2.02 (H) 11/12/2019  He is not on ACE inhibitor/ARB.  -+ HL; last set of lipids: Lab Results  Component Value Date   CHOL 134 06/11/2019   HDL 41.50 06/11/2019   LDLCALC 76 06/11/2019   LDLDIRECT 84.0 05/02/2018   TRIG 83.0 06/11/2019   CHOLHDL 3 06/11/2019  On pravastatin 40, omega-3 fatty acids.  - last eye exam was in spring 2020: Reportedly no DR, + glaucoma.  - no numbness and tingling in his feet.  Pt has FH of DM in mother, sister.  He is on Amiodarone, dose reduced from 400 to 200 mg before last visit.  ROS: Constitutional: no weight gain/+ weight loss, no  fatigue, no subjective hyperthermia, no subjective hypothermia Eyes: no blurry vision, no xerophthalmia ENT: no sore throat, no nodules palpated in neck, no dysphagia, no odynophagia, no hoarseness Cardiovascular: no CP/no SOB/no palpitations/no leg swelling Respiratory: no cough/no SOB/no wheezing Gastrointestinal: no N/no V/no D/no C/no acid reflux Musculoskeletal: no muscle aches/no joint aches Skin: no rashes, no hair loss Neurological: + tremors/no numbness/no tingling/no dizziness  I reviewed pt's medications, allergies, PMH, social hx, family hx, and changes were documented in the history of present illness. Otherwise, unchanged from my initial visit note.  Past Medical History:  Diagnosis Date   AORTIC STENOSIS, MODERATE 12/05/2009   Atrial fibrillation (HCC)    Atrial fibrillation with RVR (HCC) 07/05/2017   Atrial flutter (HCC) 12/05/2009   Atypical chest pain 11/08/2011   BENIGN PROSTATIC HYPERTROPHY, HX OF 12/05/2009   CHEST PAIN-UNSPECIFIED 11/11/2009   CHF 12/05/2009   Chronic renal insufficiency 12/16/2016   Debility 04/18/2012   Diarrhea 12/16/2016   DYSPNEA 12/19/2009   Edema 04/18/2012   ERECTILE DYSFUNCTION 01/25/2007   FATIGUE, CHRONIC 11/11/2009   Gout 07/10/2012   Heart murmur    History of kidney stones    "they passed" (07/05/2017)   Hyperkalemia 04/18/2012   Hyperlipidemia    Hypertension    HYPERTENSION 01/25/2007   Hypoxia 05/05/2011   Insomnia 05/13/2016   INSOMNIA, HX OF 01/25/2007   Knee pain, bilateral 12/28/2010   Knee pain, right 12/28/2010   NEUROMA 01/05/2010   OBESITY NOS 01/25/2007   OSA on CPAP 04/06/2010   PERIPHERAL NEUROPATHY 01/05/2010   PULMONARY FUNCTION TESTS, ABNORMAL 02/02/2010   Superficial thrombophlebitis of left leg 09/07/2011   TESTICULAR HYPOFUNCTION 01/05/2010   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   Toe pain, right 07/10/2012   Type II diabetes mellitus (HCC) 12/05/2009   UNSPECIFIED ANEMIA 01/05/2010   Past Surgical  History:  Procedure Laterality Date   A-FLUTTER ABLATION N/A 05/16/2019   Procedure: A-FLUTTER ABLATION;  Surgeon: Regan Lemming, MD;  Location: [facility];  Service: Cardiovascular;  Laterality: N/A;   ATRIAL FIBRILLATION ABLATION N/A 05/16/2019   Procedure: ATRIAL FIBRILLATION ABLATION;  Surgeon: Regan Lemming, MD;  Location: [facility];  Service: Cardiovascular;  Laterality: N/A;   CARDIAC CATHETERIZATION  10/16/2009   nonischemic cardiomyopathy   CARDIOVERSION N/A 03/25/2015   Procedure: CARDIOVERSION;  Surgeon: Chrystie Nose,  MD;  Location: [place];  Service: Cardiovascular;  Laterality: N/A;   CARDIOVERSION N/A 06/17/2017   Procedure: CARDIOVERSION;  Surgeon: Sanda Klein, MD;  Location: [place];  Service: Cardiovascular;  Laterality: N/A;   CARDIOVERSION N/A 07/08/2017   Procedure: CARDIOVERSION;  Surgeon: Fay Records, MD;  Location: [place];  Service: Cardiovascular;  Laterality: N/A;   CARDIOVERSION N/A 02/27/2019   Procedure: CARDIOVERSION;  Surgeon: Sanda Klein, MD;  Location: [place];  Service: Cardiovascular;  Laterality: N/A;   CARDIOVERSION N/A 08/16/2019   Procedure: CARDIOVERSION;  Surgeon: Sanda Klein, MD;  Location: [place];  Service: Cardiovascular;  Laterality: N/A;   METATARSAL OSTEOTOMY Bilateral ~ 1980   removed part of 5th metatarsal to corect curvature of toes    RIGHT HEART CATH N/A 05/16/2019   Procedure: RIGHT HEART CATH;  Surgeon: Jolaine Artist, MD;  Location: [place] [facility];  Service: Cardiovascular;  Laterality: N/A;   TEE WITHOUT CARDIOVERSION  05/16/2019   Procedure: Transesophageal Echocardiogram (Tee);  Surgeon: Constance Haw, MD;  Location: [place] [facility];  Service: Cardiovascular;;   Social History   Socioeconomic History   Marital status: Married    Spouse name: Not on file   Number of children: 4   Years of education: 12   Highest education level:  High school graduate  Occupational History   Occupation: retired    Fish farm manager: [employer]  Tobacco Use   Smoking status: Former Smoker    Packs/day: 1.00    Years: 10.00    Pack years: 10.00    Quit date: 06/07/1980    Years since quitting: 39.5   Smokeless tobacco: Never Used  Vaping Use   Vaping Use: Never used  Substance and Sexual Activity   Alcohol use: No   Drug use: No   Sexual activity: Not Currently  Other Topics Concern   Not on file  Social History Narrative   Lives with male partner in a one story home.  His son lives there off and on.  Retired from [employer].  Education: high school.  Right handed   Social Determinants of Health   Financial Resource Strain:    Difficulty of Paying Living Expenses:   Food Insecurity:    Worried About Charity fundraiser in the Last Year:    Arboriculturist in the Last Year:   Transportation Needs:    Film/video editor (Medical):    Lack of Transportation (Non-Medical):   Physical Activity:    Days of Exercise per Week:    Minutes of Exercise per Session:   Stress:    Feeling of Stress :   Social Connections:    Frequency of Communication with Friends and Family:    Frequency of Social Gatherings with Friends and Family:    Attends Religious Services:    Active Member of Clubs or Organizations:    Attends Music therapist:    Marital Status:   Intimate Partner Violence:    Fear of Current or Ex-Partner:    Emotionally Abused:    Physically Abused:    Sexually Abused:    Current Outpatient Medications on File Prior to Visit  Medication Sig Dispense Refill   albuterol (VENTOLIN HFA) 108 (90 Base) MCG/ACT inhaler Inhale 2 puffs into the lungs every 6 (six) hours as needed for wheezing or shortness of breath. 1 Inhaler 6   allopurinol (ZYLOPRIM) 300 MG tablet Take 1 tablet by mouth once daily 90 tablet 0  amiodarone (PACERONE) 200 MG tablet Take 1 tablet (200 mg total) by  mouth daily. 90 tablet 1   Blood Glucose Monitoring Suppl (ONETOUCH VERIO) w/Device KIT Use to check blood sugar TID as directed.  Dx Code: E11.9 1 kit 0   calcium carbonate (OS-CAL) 1250 (500 Ca) MG chewable tablet Chew by mouth.     cetirizine (ZYRTEC) 10 MG tablet Take 1 tablet (10 mg total) by mouth daily. 30 tablet 11   Clobetasol Prop Emollient Base (CLOBETASOL PROPIONATE E) 0.05 % emollient cream Apply 1 application topically 2 (two) times daily as needed (insect bite). 60 g 1   colchicine (COLCRYS) 0.6 MG tablet TAKE ONE TABLET BY MOUTH DAILY AS NEEDED FOR GOUT FLARE UPS. 30 tablet 0   dapagliflozin propanediol (FARXIGA) 10 MG TABS tablet Take 1 tablet (10 mg total) by mouth daily before breakfast. 30 tablet 6   ELIQUIS 5 MG TABS tablet Take 1 tablet by mouth twice daily 60 tablet 10   fish oil-omega-3 fatty acids 1000 MG capsule Take 1 g by mouth daily.      furosemide (LASIX) 80 MG tablet Take 1 tablet (80 mg total) by mouth daily. 30 tablet 6   furosemide (LASIX) 80 MG tablet Take 1 tablet (80 mg total) by mouth daily as needed (Patient will only take if instructed for Alleviate HF research protocol.). 90 tablet 3   glucose blood test strip (Verio) Use as instructed Check blood sugars TID DxE11.9 100 each 12   glyBURIDE (DIABETA) 5 MG tablet Take 5 mg by mouth as needed.      hydrALAZINE (APRESOLINE) 25 MG tablet Take 1 tablet (25 mg total) by mouth 3 (three) times daily. 90 tablet 6   HYDROcodone-acetaminophen (NORCO) 10-325 MG tablet Take 1 tablet by mouth every 8 (eight) hours as needed for moderate pain or severe pain. 90 tablet 0   hydrocortisone 2.5 % ointment Apply topically 2 (two) times daily as needed. 30 g 1   isosorbide mononitrate (IMDUR) 30 MG 24 hr tablet Take 1 tablet (30 mg total) by mouth daily. 30 tablet 11   LORazepam (ATIVAN) 1 MG tablet Take 1 tablet (1 mg total) by mouth every 8 (eight) hours as needed for anxiety. 30 tablet 5   magnesium oxide  (MAG-OX) 400 MG tablet Take 1 tablet (400 mg total) by mouth 2 (two) times daily. 30 tablet 11   metoprolol succinate (TOPROL-XL) 25 MG 24 hr tablet Take 0.5 tablets (12.5 mg total) by mouth 2 (two) times daily. 90 tablet 3   Multiple Vitamin (MULTIVITAMIN WITH MINERALS) TABS tablet Take 1 tablet by mouth daily.      naproxen (NAPROSYN) 375 MG tablet Take 375 mg by mouth 2 (two) times daily as needed (knee pain.).      potassium chloride SA (KLOR-CON) 20 MEQ tablet Take 1 tablet (20 mEq total) by mouth as directed. 1 tablet daily 20 mEq by mouth as directed per alleviate research protocol. 180 tablet 1   pravastatin (PRAVACHOL) 40 MG tablet Take 1 tablet by mouth once daily 90 tablet 3   tamsulosin (FLOMAX) 0.4 MG CAPS capsule Take 1 capsule by mouth once daily 30 capsule 0   temazepam (RESTORIL) 30 MG capsule Take 1 capsule (30 mg total) by mouth at bedtime. 30 capsule 5   No current facility-administered medications on file prior to visit.   Allergies  Allergen Reactions   Sulfonamide Derivatives Other (See Comments)    unknown reaction.  Patient states had to  go to the ER.    Zolpidem Other (See Comments)    Excessive, prolonged sedation   Family History  Problem Relation Age of Onset   Clotting disorder Mother    Heart disease Mother        s/p MI   Heart attack Mother    Hypertension Mother    Diabetes Mother    Hyperlipidemia Mother    Stroke Mother    Cancer Father        ? lung   Lung disease Father        smoker   Diabetes Sister    Hypertension Sister        smoker   Leukemia Maternal Grandmother        ?   Aneurysm Sister        brain   Other Sister        clipped   Seizures Sister        d/o w/aneurysm/ smoker   PE: BP 122/70 (BP Location: Left Arm, Patient Position: Sitting, Cuff Size: Small)    Pulse 72    Ht '5\' 11"'$  (1.803 m)    Wt 182 lb 6 oz (82.7 kg)    SpO2 97%    BMI 25.44 kg/m  Wt Readings from Last 3 Encounters:  12/14/19  182 lb 6 oz (82.7 kg)  11/12/19 189 lb (85.7 kg)  11/08/19 188 lb 9.6 oz (85.5 kg)   Constitutional: overweight, in NAD Eyes: PERRLA, EOMI, no exophthalmos ENT: moist mucous membranes, no thyromegaly, no cervical lymphadenopathy Cardiovascular: RRR, No MRG Respiratory: CTA B Gastrointestinal: abdomen soft, NT, ND, BS+ Musculoskeletal: no deformities, strength intact in all 4 Skin: moist, warm, no rashes Neurological: no tremor with outstretched hands, DTR normal in all 4  ASSESSMENT: 1. DM2, non-insulin-dependent, uncontrolled, with long-term complications - s and d CHF - A fib - s/p cardioversion - 05/2019 >> went into cardiac shock - CKD - sees nephrology  2.  Overweight  3. HL  PLAN:  1. Patient with longstanding, uncontrolled, type 2 diabetes, now diet controlled after an HbA1c returned at 5.3% at last visit when he was taking glyburide.  He was on 5 mg in a.m. and 2.5 mg before dinner.  His sugars are mostly at goal and occasionally lower later in the day.  He had 1 significant high blood sugar in the 300s.  He was drinking Ensure and I advised him to stop this and to switch to Glucerna if he cannot eat a regular meal.  I also advised him to check sugars consistently, once a day. -At this visit, sugars are higher in the morning, and expected after she stopped taking glyburide consistently but they are in the 130s, usually up to 140.  We discussed that these are slightly above target but since the sugars later in the day are usually at goal, we can continue with the plan to stay with antidiabetic medications and only use glyburide low-dose as needed before a higher carb meal or before fried food.  He only had to have this once last visit.  He cannot remember if he took it before or after the appropriate meal but his sugars were 200 after the meal.  I advised him that if he feels that the meal will need glyburide, to take it approximately 30 minutes before the meal, not after the  meal. - I suggested to:  Patient Instructions  You may take glyburide half a tablet before a meal before a meal  with fried food or dessert  Check sugars once a day, rotating check times.  Please return in 4 months with your sugar log.   - we checked his HbA1c: 6.6% (higher, but still at goal) - advised to check sugars at different times of the day - 1x a day, rotating check times - advised for yearly eye exams >> he is not UTD - return to clinic in 4 months  2.  Overweight -We stopped glyburide at last visit and advised him to only use it on a prn basis -I explained that this is a weight inducing effect. -Since last visit, he lost 14 lbs!  I congratulated him for the weight loss.  3. HL - Reviewed latest lipid panel from 06/2019: LDL slightly above target, the rest of the fractions are at goal Lab Results  Component Value Date   CHOL 134 06/11/2019   HDL 41.50 06/11/2019   LDLCALC 76 06/11/2019   LDLDIRECT 84.0 05/02/2018   TRIG 83.0 06/11/2019   CHOLHDL 3 06/11/2019  - Continues pravastatin and omega-3 fatty acids without side effects.   Philemon Kingdom, MD PhD [facility]

## 2019-12-24 ENCOUNTER — Ambulatory Visit (INDEPENDENT_AMBULATORY_CARE_PROVIDER_SITE_OTHER): Payer: Medicare HMO | Admitting: Family Medicine

## 2019-12-24 ENCOUNTER — Other Ambulatory Visit: Payer: Self-pay

## 2019-12-24 ENCOUNTER — Ambulatory Visit (INDEPENDENT_AMBULATORY_CARE_PROVIDER_SITE_OTHER): Payer: Self-pay | Admitting: Cardiology

## 2019-12-24 ENCOUNTER — Encounter: Payer: Self-pay | Admitting: Cardiology

## 2019-12-24 ENCOUNTER — Ambulatory Visit: Payer: Medicare Other | Admitting: Family Medicine

## 2019-12-24 VITALS — BP 118/68 | HR 73 | Ht 71.0 in | Wt 183.0 lb

## 2019-12-24 DIAGNOSIS — I10 Essential (primary) hypertension: Secondary | ICD-10-CM

## 2019-12-24 DIAGNOSIS — Z6830 Body mass index (BMI) 30.0-30.9, adult: Secondary | ICD-10-CM

## 2019-12-24 DIAGNOSIS — E1169 Type 2 diabetes mellitus with other specified complication: Secondary | ICD-10-CM

## 2019-12-24 DIAGNOSIS — E669 Obesity, unspecified: Secondary | ICD-10-CM

## 2019-12-24 DIAGNOSIS — N179 Acute kidney failure, unspecified: Secondary | ICD-10-CM | POA: Diagnosis not present

## 2019-12-24 DIAGNOSIS — I5042 Chronic combined systolic (congestive) and diastolic (congestive) heart failure: Secondary | ICD-10-CM | POA: Diagnosis not present

## 2019-12-24 DIAGNOSIS — I480 Paroxysmal atrial fibrillation: Secondary | ICD-10-CM

## 2019-12-24 DIAGNOSIS — N183 Chronic kidney disease, stage 3 unspecified: Secondary | ICD-10-CM | POA: Diagnosis not present

## 2019-12-24 DIAGNOSIS — M1A9XX0 Chronic gout, unspecified, without tophus (tophi): Secondary | ICD-10-CM

## 2019-12-24 DIAGNOSIS — E6609 Other obesity due to excess calories: Secondary | ICD-10-CM | POA: Diagnosis not present

## 2019-12-24 LAB — CUP PACEART INCLINIC DEVICE CHECK
Date Time Interrogation Session: 20210719164811
Implantable Pulse Generator Implant Date: 20201209

## 2019-12-24 MED ORDER — HYDRALAZINE HCL 25 MG PO TABS: 25.0000 mg | ORAL_TABLET | Freq: Three times a day (TID) | ORAL | 2 refills | Status: DC

## 2019-12-24 NOTE — Assessment & Plan Note (Signed)
No recent flare.  

## 2019-12-24 NOTE — Assessment & Plan Note (Signed)
Well controlled, no changes to meds. Encouraged heart healthy diet such as the DASH diet and exercise as tolerated.  °

## 2019-12-24 NOTE — Assessment & Plan Note (Signed)
Following closely with cardiology and doing well

## 2019-12-24 NOTE — Assessment & Plan Note (Signed)
hgba1c acceptable, minimize simple carbs. Increase exercise as tolerated. Continue current meds 

## 2019-12-24 NOTE — Assessment & Plan Note (Signed)
Is following with nephrology and doing well

## 2019-12-24 NOTE — Assessment & Plan Note (Signed)
Has been eating better and moving as able. Good intentional weight loss and he reports feeling better as a result

## 2019-12-24 NOTE — Progress Notes (Signed)
Patient ID: [name], male   DOB: 06-14-1950, 69 y.o.   MRN: [id number]   Subjective:    Patient ID: [name], male    DOB: 22-Oct-1950, 69 y.o.   MRN: [id number]  Chief Complaint  Patient presents with   Follow-up    HPI Patient is in today for follow up on chronic medical concerns and he reports feeling very well. No recent febrile illness or hospitalizations. He has been eating better and intentionally loosing weight that has helped him to feel better. Denies CP/palp/SOB/HA/congestion/fevers/GI or GU c/o. Taking meds as prescribed  Past Medical History:  Diagnosis Date   AORTIC STENOSIS, MODERATE 12/05/2009   Atrial fibrillation (HCC)    Atrial fibrillation with RVR ([place]) 07/05/2017   Atrial flutter ([place]) 12/05/2009   Atypical chest pain 11/08/2011   BENIGN PROSTATIC HYPERTROPHY, HX OF 12/05/2009   CHEST PAIN-UNSPECIFIED 11/11/2009   CHF 12/05/2009   Chronic renal insufficiency 12/16/2016   Debility 04/18/2012   Diarrhea 12/16/2016   DYSPNEA 12/19/2009   Edema 04/18/2012   ERECTILE DYSFUNCTION 01/25/2007   FATIGUE, CHRONIC 11/11/2009   Gout 07/10/2012   Heart murmur    History of kidney stones    "they passed" (07/05/2017)   Hyperkalemia 04/18/2012   Hyperlipidemia    Hypertension    HYPERTENSION 01/25/2007   Hypoxia 05/05/2011   Insomnia 05/13/2016   INSOMNIA, HX OF 01/25/2007   Knee pain, bilateral 12/28/2010   Knee pain, right 12/28/2010   NEUROMA 01/05/2010   OBESITY NOS 01/25/2007   OSA on CPAP 04/06/2010   PERIPHERAL NEUROPATHY 01/05/2010   PULMONARY FUNCTION TESTS, ABNORMAL 02/02/2010   Superficial thrombophlebitis of left leg 09/07/2011   TESTICULAR HYPOFUNCTION 01/05/2010   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   Toe pain, right 07/10/2012   Type II diabetes mellitus ([place]) 12/05/2009   UNSPECIFIED ANEMIA 01/05/2010    Past Surgical History:  Procedure Laterality Date   A-FLUTTER ABLATION N/A 05/16/2019   Procedure: A-FLUTTER ABLATION;  Surgeon:  Constance Haw, MD;  Location: [facility];  Service: Cardiovascular;  Laterality: N/A;   ATRIAL FIBRILLATION ABLATION N/A 05/16/2019   Procedure: ATRIAL FIBRILLATION ABLATION;  Surgeon: Constance Haw, MD;  Location: [facility];  Service: Cardiovascular;  Laterality: N/A;   CARDIAC CATHETERIZATION  10/16/2009   nonischemic cardiomyopathy   CARDIOVERSION N/A 03/25/2015   Procedure: CARDIOVERSION;  Surgeon: Pixie Casino, MD;  Location: [place];  Service: Cardiovascular;  Laterality: N/A;   CARDIOVERSION N/A 06/17/2017   Procedure: CARDIOVERSION;  Surgeon: Sanda Klein, MD;  Location: [facility];  Service: Cardiovascular;  Laterality: N/A;   CARDIOVERSION N/A 07/08/2017   Procedure: CARDIOVERSION;  Surgeon: Fay Records, MD;  Location: [facility];  Service: Cardiovascular;  Laterality: N/A;   CARDIOVERSION N/A 02/27/2019   Procedure: CARDIOVERSION;  Surgeon: Sanda Klein, MD;  Location: [place];  Service: Cardiovascular;  Laterality: N/A;   CARDIOVERSION N/A 08/16/2019   Procedure: CARDIOVERSION;  Surgeon: Sanda Klein, MD;  Location: [place];  Service: Cardiovascular;  Laterality: N/A;   METATARSAL OSTEOTOMY Bilateral ~ 1980   removed part of 5th metatarsal to corect curvature of toes    RIGHT HEART CATH N/A 05/16/2019   Procedure: RIGHT HEART CATH;  Surgeon: Jolaine Artist, MD;  Location: [place] [facility];  Service: Cardiovascular;  Laterality: N/A;   TEE WITHOUT CARDIOVERSION  05/16/2019   Procedure: Transesophageal Echocardiogram ([place]);  Surgeon: Constance Haw, MD;  Location: [facility];  Service: Cardiovascular;;  Family History  Problem Relation Age of Onset   Clotting disorder Mother    Heart disease Mother        s/p MI   Heart attack Mother    Hypertension Mother    Diabetes Mother    Hyperlipidemia Mother    Stroke Mother    Cancer Father        ? lung   Lung disease Father         smoker   Diabetes Sister    Hypertension Sister        smoker   Leukemia Maternal Grandmother        ?   Aneurysm Sister        brain   Other Sister        clipped   Seizures Sister        d/o w/aneurysm/ smoker    Social History   Socioeconomic History   Marital status: Married    Spouse name: Not on file   Number of children: 4   Years of education: 12   Highest education level: High school graduate  Occupational History   Occupation: retired    Fish farm manager: [employer]  Tobacco Use   Smoking status: Former Smoker    Packs/day: 1.00    Years: 10.00    Pack years: 10.00    Quit date: 06/07/1980    Years since quitting: 39.5   Smokeless tobacco: Never Used  Vaping Use   Vaping Use: Never used  Substance and Sexual Activity   Alcohol use: No   Drug use: No   Sexual activity: Not Currently  Other Topics Concern   Not on file  Social History Narrative   Lives with male partner in a one story home.  His son lives there off and on.  Retired from [employer].  Education: high school.  Right handed   Social Determinants of Health   Financial Resource Strain:    Difficulty of Paying Living Expenses:   Food Insecurity:    Worried About Charity fundraiser in the Last Year:    Arboriculturist in the Last Year:   Transportation Needs:    Film/video editor (Medical):    Lack of Transportation (Non-Medical):   Physical Activity:    Days of Exercise per Week:    Minutes of Exercise per Session:   Stress:    Feeling of Stress :   Social Connections:    Frequency of Communication with Friends and Family:    Frequency of Social Gatherings with Friends and Family:    Attends Religious Services:    Active Member of Clubs or Organizations:    Attends Music therapist:    Marital Status:   Intimate Partner Violence:    Fear of Current or Ex-Partner:    Emotionally Abused:    Physically Abused:    Sexually Abused:      Outpatient Medications Prior to Visit  Medication Sig Dispense Refill   albuterol (VENTOLIN HFA) 108 (90 Base) MCG/ACT inhaler Inhale 2 puffs into the lungs every 6 (six) hours as needed for wheezing or shortness of breath. 1 Inhaler 6   allopurinol (ZYLOPRIM) 300 MG tablet Take 1 tablet by mouth once daily 90 tablet 0   amiodarone (PACERONE) 200 MG tablet Take 1 tablet (200 mg total) by mouth daily. 90 tablet 1   Blood Glucose Monitoring Suppl (ONETOUCH VERIO) w/Device KIT Use to check blood sugar TID as directed.  Dx  Code: E11.9 1 kit 0   calcium carbonate (OS-CAL) 1250 (500 Ca) MG chewable tablet Chew by mouth.     cetirizine (ZYRTEC) 10 MG tablet Take 1 tablet (10 mg total) by mouth daily. 30 tablet 11   Clobetasol Prop Emollient Base (CLOBETASOL PROPIONATE E) 0.05 % emollient cream Apply 1 application topically 2 (two) times daily as needed (insect bite). 60 g 1   colchicine (COLCRYS) 0.6 MG tablet TAKE ONE TABLET BY MOUTH DAILY AS NEEDED FOR GOUT FLARE UPS. 30 tablet 0   dapagliflozin propanediol (FARXIGA) 10 MG TABS tablet Take 1 tablet (10 mg total) by mouth daily before breakfast. 30 tablet 6   ELIQUIS 5 MG TABS tablet Take 1 tablet by mouth twice daily 60 tablet 10   fish oil-omega-3 fatty acids 1000 MG capsule Take 1 g by mouth daily.      furosemide (LASIX) 80 MG tablet Take 1 tablet (80 mg total) by mouth daily. 30 tablet 6   glucose blood test strip (Verio) Use as instructed Check blood sugars TID DxE11.9 100 each 12   glyBURIDE (DIABETA) 5 MG tablet Take 5 mg by mouth as needed.      hydrALAZINE (APRESOLINE) 25 MG tablet Take 1 tablet (25 mg total) by mouth 3 (three) times daily. 90 tablet 6   HYDROcodone-acetaminophen (NORCO) 10-325 MG tablet Take 1 tablet by mouth every 8 (eight) hours as needed for moderate pain or severe pain. 90 tablet 0   hydrocortisone 2.5 % ointment Apply topically 2 (two) times daily as needed. 30 g 1   isosorbide mononitrate (IMDUR)  30 MG 24 hr tablet Take 1 tablet (30 mg total) by mouth daily. 30 tablet 11   LORazepam (ATIVAN) 1 MG tablet Take 1 tablet (1 mg total) by mouth every 8 (eight) hours as needed for anxiety. 30 tablet 5   magnesium oxide (MAG-OX) 400 MG tablet Take 1 tablet (400 mg total) by mouth 2 (two) times daily. 30 tablet 11   metoprolol succinate (TOPROL-XL) 25 MG 24 hr tablet Take 0.5 tablets (12.5 mg total) by mouth 2 (two) times daily. 90 tablet 3   Multiple Vitamin (MULTIVITAMIN WITH MINERALS) TABS tablet Take 1 tablet by mouth daily.      naproxen (NAPROSYN) 375 MG tablet Take 375 mg by mouth 2 (two) times daily as needed (knee pain.).      potassium chloride SA (KLOR-CON) 20 MEQ tablet Take 1 tablet (20 mEq total) by mouth as directed. 1 tablet daily 20 mEq by mouth as directed per alleviate research protocol. 180 tablet 1   pravastatin (PRAVACHOL) 40 MG tablet Take 1 tablet by mouth once daily 90 tablet 3   tamsulosin (FLOMAX) 0.4 MG CAPS capsule Take 1 capsule by mouth once daily 30 capsule 0   temazepam (RESTORIL) 30 MG capsule Take 1 capsule (30 mg total) by mouth at bedtime. 30 capsule 5   No facility-administered medications prior to visit.    Allergies  Allergen Reactions   Sulfonamide Derivatives Other (See Comments)    unknown reaction.  Patient states had to go to the ER.    Zolpidem Other (See Comments)    Excessive, prolonged sedation    Review of Systems  Constitutional: Negative for fever and malaise/fatigue.  HENT: Negative for congestion.   Eyes: Negative for blurred vision.  Respiratory: Negative for shortness of breath.   Cardiovascular: Negative for chest pain, palpitations and leg swelling.  Gastrointestinal: Negative for abdominal pain, blood in stool and nausea.  Genitourinary: Negative for dysuria and frequency.  Musculoskeletal: Negative for falls.  Skin: Negative for rash.  Neurological: Negative for dizziness, loss of consciousness and headaches.   Endo/Heme/Allergies: Negative for environmental allergies.  Psychiatric/Behavioral: Negative for depression. The patient is not nervous/anxious.        Objective:    Physical Exam Vitals and nursing note reviewed.  Constitutional:      General: He is not in acute distress.    Appearance: He is well-developed.  HENT:     Head: Normocephalic and atraumatic.     Nose: Nose normal.  Eyes:     General:        Right eye: No discharge.        Left eye: No discharge.  Cardiovascular:     Rate and Rhythm: Normal rate and regular rhythm.     Heart sounds: Murmur heard.   Pulmonary:     Effort: Pulmonary effort is normal.     Breath sounds: Normal breath sounds.  Abdominal:     General: Bowel sounds are normal.     Palpations: Abdomen is soft.     Tenderness: There is no abdominal tenderness.  Musculoskeletal:     Cervical back: Normal range of motion and neck supple.  Skin:    General: Skin is warm and dry.  Neurological:     Mental Status: He is alert and oriented to person, place, and time.     BP 100/60 (BP Location: Left Arm, Cuff Size: Normal)    Pulse 66    Temp 97.6 F (36.4 C) (Temporal)    Resp 12    Ht _0  (1.803 m)    Wt 182 lb 9.6 oz (82.8 kg)    SpO2 99%    BMI 25.47 kg/m  Wt Readings from Last 3 Encounters:  12/24/19 182 lb 9.6 oz (82.8 kg)  12/14/19 182 lb 6 oz (82.7 kg)  11/12/19 189 lb (85.7 kg)    Diabetic Foot Exam - Simple   No data filed     Lab Results  Component Value Date   WBC 4.0 11/12/2019   HGB 10.6 (L) 11/12/2019   HCT 36.9 (L) 11/12/2019   PLT 208 11/12/2019   GLUCOSE 138 (H) 11/26/2019   CHOL 134 06/11/2019   TRIG 83.0 06/11/2019   HDL 41.50 06/11/2019   LDLDIRECT 84.0 05/02/2018   LDLCALC 76 06/11/2019   ALT 23 07/23/2019   AST 17 07/23/2019   NA 139 11/26/2019   K 4.7 11/26/2019   CL 105 11/26/2019   CREATININE 2.16 (H) 11/26/2019   BUN 34 (H) 11/26/2019   CO2 24 11/26/2019   TSH 1.91 06/11/2019   PSA 10.76 (H)  02/06/2019   INR 2.2 09/06/2018   HGBA1C 6.6 (A) 12/14/2019   MICROALBUR 1.1 04/14/2015    Lab Results  Component Value Date   TSH 1.91 06/11/2019   Lab Results  Component Value Date   WBC 4.0 11/12/2019   HGB 10.6 (L) 11/12/2019   HCT 36.9 (L) 11/12/2019   MCV 85.0 11/12/2019   PLT 208 11/12/2019   Lab Results  Component Value Date   NA 139 11/26/2019   K 4.7 11/26/2019   CO2 24 11/26/2019   GLUCOSE 138 (H) 11/26/2019   BUN 34 (H) 11/26/2019   CREATININE 2.16 (H) 11/26/2019   BILITOT 0.7 07/23/2019   ALKPHOS 56 07/23/2019   AST 17 07/23/2019   ALT 23 07/23/2019   PROT 6.8 07/23/2019   ALBUMIN 4.0 07/23/2019  CALCIUM 9.1 11/26/2019   ANIONGAP 10 11/26/2019   GFR 43.05 (L) 08/02/2019   Lab Results  Component Value Date   CHOL 134 06/11/2019   Lab Results  Component Value Date   HDL 41.50 06/11/2019   Lab Results  Component Value Date   LDLCALC 76 06/11/2019   Lab Results  Component Value Date   TRIG 83.0 06/11/2019   Lab Results  Component Value Date   CHOLHDL 3 06/11/2019   Lab Results  Component Value Date   HGBA1C 6.6 (A) 12/14/2019       Assessment & Plan:   Problem List Items Addressed This Visit    Diabetes mellitus type 2 in obese ([place])    hgba1c acceptable, minimize simple carbs. Increase exercise as tolerated. Continue current meds      Obesity    Has been eating better and moving as able. Good intentional weight loss and he reports feeling better as a result      Chronic combined systolic and diastolic heart failure ([place])    Following closely with cardiology and doing well      Gout    No recent flare      Essential hypertension, malignant    Well controlled, no changes to meds. Encouraged heart healthy diet such as the DASH diet and exercise as tolerated.       Acute-on-chronic kidney injury [facility])    Is following with nephrology and doing well         I am having [name] maintain his fish oil-omega-3  fatty acids, cetirizine, colchicine, albuterol, HYDROcodone-acetaminophen, glucose blood, multivitamin with minerals, OneTouch Verio, Eliquis, pravastatin, naproxen, hydrALAZINE, isosorbide mononitrate, tamsulosin, magnesium oxide, LORazepam, temazepam, Clobetasol Prop Emollient Base, amiodarone, metoprolol succinate, hydrocortisone, allopurinol, calcium carbonate, glyBURIDE, dapagliflozin propanediol, furosemide, and potassium chloride SA.  No orders of the defined types were placed in this encounter.    Penni Homans, MD

## 2019-12-24 NOTE — Progress Notes (Signed)
Electrophysiology Office Note   Date:  12/24/2019   ID:  [name], [name] [birth year], MRN [id number]  PCP:  [name], MD  Cardiologist:  [facility] Primary Electrophysiologist:  Vale Peraza Meredith [name], MD    No chief complaint on file.    History of Present Illness: [name] is a 69 y.o. male who is being seen today for the evaluation of atrial fibrillation at the request of Mihai Croitrou. Presenting today for electrophysiology evaluation.  He has a history of atrial fibrillation, atrial flutter, aortic insufficiency, hypertension, OSA, hyperlipidemia, type 2 diabetes, and diastolic heart failure.   He had cardioversion 02/27/2019.  Based on his symptoms, he was in sinus rhythm for 24 to 48 hours.  He has developed NYHA class II-III exertional dyspnea and fatigue.  He does not have chest pain.  He does report compliance with CPAP.  ECG previously showed atrial flutter with a heart rate of 113.   He had AF ablation 05/16/2019.  He was in acute systolic heart failure at the time of ablation and required prolonged hospitalization postop.  He had done well, but did go back into atrial fibrillation.  Today, denies symptoms of palpitations, chest pain, shortness of breath, orthopnea, PND, lower extremity edema, claudication, dizziness, presyncope, syncope, bleeding, or neurologic sequela. The patient is tolerating medications without difficulties.  He is felt well without any issues.  He remains on atrial fibrillation, though he does have a slight tremor.  He has no other major complaints.  He has been exercising without issue.  He is adhering to a low-salt low-fat diet.  Past Medical History:  Diagnosis Date  . AORTIC STENOSIS, MODERATE 12/05/2009  . Atrial fibrillation ([place])   . Atrial fibrillation with RVR ([place]) 07/05/2017  . Atrial flutter ([place]) 12/05/2009  . Atypical chest pain 11/08/2011  . BENIGN PROSTATIC HYPERTROPHY, HX OF 12/05/2009  . CHEST PAIN-UNSPECIFIED 11/11/2009  . CHF  12/05/2009  . Chronic renal insufficiency 12/16/2016  . Debility 04/18/2012  . Diarrhea 12/16/2016  . DYSPNEA 12/19/2009  . Edema 04/18/2012  . ERECTILE DYSFUNCTION 01/25/2007  . FATIGUE, CHRONIC 11/11/2009  . Gout 07/10/2012  . Heart murmur   . History of kidney stones    "they passed" (07/05/2017)  . Hyperkalemia 04/18/2012  . Hyperlipidemia   . Hypertension   . HYPERTENSION 01/25/2007  . Hypoxia 05/05/2011  . Insomnia 05/13/2016  . INSOMNIA, HX OF 01/25/2007  . Knee pain, bilateral 12/28/2010  . Knee pain, right 12/28/2010  . NEUROMA 01/05/2010  . OBESITY NOS 01/25/2007  . OSA on CPAP 04/06/2010  . PERIPHERAL NEUROPATHY 01/05/2010  . PULMONARY FUNCTION TESTS, ABNORMAL 02/02/2010  . Superficial thrombophlebitis of left leg 09/07/2011  . TESTICULAR HYPOFUNCTION 01/05/2010  . 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 Toe pain, right 07/10/2012  . Type II diabetes mellitus ([place]) 12/05/2009  . UNSPECIFIED ANEMIA 01/05/2010   Past Surgical History:  Procedure Laterality Date  . A-FLUTTER ABLATION N/A 05/16/2019   Procedure: A-FLUTTER ABLATION;  Surgeon: Constance Haw, MD;  Location: [facility];  Service: Cardiovascular;  Laterality: N/A;  . ATRIAL FIBRILLATION ABLATION N/A 05/16/2019   Procedure: ATRIAL FIBRILLATION ABLATION;  Surgeon: Constance Haw, MD;  Location: [facility];  Service: Cardiovascular;  Laterality: N/A;  . CARDIAC CATHETERIZATION  10/16/2009   nonischemic cardiomyopathy  . CARDIOVERSION N/A 03/25/2015   Procedure: CARDIOVERSION;  Surgeon: Pixie Casino, MD;  Location[facility];  Service: Cardiovascular;  Laterality: N/A;  . CARDIOVERSION N/A 06/17/2017  Procedure: CARDIOVERSION;  Surgeon: Sanda Klein, MD;  Location: [place] ENDOSCOPY;  Service: Cardiovascular;  Laterality: N/A;  . CARDIOVERSION N/A 07/08/2017   Procedure: CARDIOVERSION;  Surgeon: Fay Records, MD;  Location: [facility] ENDOSCOPY;  Service: Cardiovascular;  Laterality: N/A;  . CARDIOVERSION N/A 02/27/2019    Procedure: CARDIOVERSION;  Surgeon: Sanda Klein, MD;  Location: [facility] ENDOSCOPY;  Service: Cardiovascular;  Laterality: N/A;  . CARDIOVERSION N/A 08/16/2019   Procedure: CARDIOVERSION;  Surgeon: Sanda Klein, MD;  Location: [facility] ENDOSCOPY;  Service: Cardiovascular;  Laterality: N/A;  . METATARSAL OSTEOTOMY Bilateral ~ 1980   removed part of 5th metatarsal to corect curvature of toes   . RIGHT HEART CATH N/A 05/16/2019   Procedure: RIGHT HEART CATH;  Surgeon: Jolaine Artist, MD;  Location: [place] [facility];  Service: Cardiovascular;  Laterality: N/A;  . TEE WITHOUT CARDIOVERSION  05/16/2019   Procedure: Transesophageal Echocardiogram (Tee);  Surgeon: Constance Haw, MD;  Location: [place] [facility];  Service: Cardiovascular;;     Current Outpatient Medications  Medication Sig Dispense Refill  . albuterol (VENTOLIN HFA) 108 (90 Base) MCG/ACT inhaler Inhale 2 puffs into the lungs every 6 (six) hours as needed for wheezing or shortness of breath. 1 Inhaler 6  . allopurinol (ZYLOPRIM) 300 MG tablet Take 1 tablet by mouth once daily 90 tablet 0  . Blood Glucose Monitoring Suppl (ONETOUCH VERIO) w/Device KIT Use to check blood sugar TID as directed.  Dx Code: E11.9 1 kit 0  . calcium carbonate (OS-CAL) 1250 (500 Ca) MG chewable tablet Chew by mouth.    . cetirizine (ZYRTEC) 10 MG tablet Take 1 tablet (10 mg total) by mouth daily. 30 tablet 11  . Clobetasol Prop Emollient Base (CLOBETASOL PROPIONATE E) 0.05 % emollient cream Apply 1 application topically 2 (two) times daily as needed (insect bite). 60 g 1  . colchicine (COLCRYS) 0.6 MG tablet TAKE ONE TABLET BY MOUTH DAILY AS NEEDED FOR GOUT FLARE UPS. 30 tablet 0  . dapagliflozin propanediol (FARXIGA) 10 MG TABS tablet Take 1 tablet (10 mg total) by mouth daily before breakfast. 30 tablet 6  . ELIQUIS 5 MG TABS tablet Take 1 tablet by mouth twice daily 60 tablet 10  . fish oil-omega-3 fatty acids 1000 MG capsule Take 1 g by mouth daily.      . furosemide (LASIX) 80 MG tablet Take 1 tablet (80 mg total) by mouth daily. 30 tablet 6  . glucose blood test strip (Verio) Use as instructed Check blood sugars TID DxE11.9 100 each 12  . glyBURIDE (DIABETA) 5 MG tablet Take 5 mg by mouth as needed.     . hydrALAZINE (APRESOLINE) 25 MG tablet Take 1 tablet (25 mg total) by mouth 3 (three) times daily. 90 tablet 2  . HYDROcodone-acetaminophen (NORCO) 10-325 MG tablet Take 1 tablet by mouth every 8 (eight) hours as needed for moderate pain or severe pain. 90 tablet 0  . hydrocortisone 2.5 % ointment Apply topically 2 (two) times daily as needed. 30 g 1  . isosorbide mononitrate (IMDUR) 30 MG 24 hr tablet Take 1 tablet (30 mg total) by mouth daily. 30 tablet 11  . LORazepam (ATIVAN) 1 MG tablet Take 1 tablet (1 mg total) by mouth every 8 (eight) hours as needed for anxiety. 30 tablet 5  . magnesium oxide (MAG-OX) 400 MG tablet Take 1 tablet (400 mg total) by mouth 2 (two) times daily. 30 tablet 11  . metoprolol succinate (TOPROL-XL) 25 MG 24 hr tablet  Take 0.5 tablets (12.5 mg total) by mouth 2 (two) times daily. 90 tablet 3  . Multiple Vitamin (MULTIVITAMIN WITH MINERALS) TABS tablet Take 1 tablet by mouth daily.     . naproxen (NAPROSYN) 375 MG tablet Take 375 mg by mouth 2 (two) times daily as needed (knee pain.).     [name] potassium chloride SA (KLOR-CON) 20 MEQ tablet Take 1 tablet (20 mEq total) by mouth as directed. 1 tablet daily 20 mEq by mouth as directed per alleviate research protocol. 180 tablet 1  . pravastatin (PRAVACHOL) 40 MG tablet Take 1 tablet by mouth once daily 90 tablet 3  . tamsulosin (FLOMAX) 0.4 MG CAPS capsule Take 1 capsule by mouth once daily 30 capsule 0  . temazepam (RESTORIL) 30 MG capsule Take 1 capsule (30 mg total) by mouth at bedtime. 30 capsule 5   No current facility-administered medications for this visit.    Allergies:   Sulfonamide derivatives and Zolpidem   Social History:  The patient  reports that he  quit smoking about 39 years ago. He has a 10.00 pack-year smoking history. He has never used smokeless tobacco. He reports that he does not drink alcohol and does not use drugs.   Family History:  The patient's family history includes Aneurysm in his sister; Cancer in his father; Clotting disorder in his mother; Diabetes in his mother and sister; Heart attack in his mother; Heart disease in his mother; Hyperlipidemia in his mother; Hypertension in his mother and sister; Leukemia in his maternal grandmother; Lung disease in his father; Other in his sister; Seizures in his sister; Stroke in his mother.    ROS:  Please see the history of present illness.   Otherwise, review of systems is positive for none.   All other systems are reviewed and negative.   PHYSICAL EXAM: VS:  BP 118/68   Pulse 73   Ht _0  (1.803 m)   Wt 183 lb (83 kg)   SpO2 95%   BMI 25.52 kg/m  , BMI Body mass index is 25.52 kg/m. GEN: Well nourished, well developed, in no acute distress  HEENT: normal  Neck: no JVD, carotid bruits, or masses Cardiac: RRR; no murmurs, rubs, or gallops,no edema  Respiratory:  clear to auscultation bilaterally, normal work of breathing GI: soft, nontender, nondistended, + BS MS: no deformity or atrophy  Skin: warm and dry Neuro:  Strength and sensation are intact Psych: euthymic mood, full affect  EKG:  EKG is ordered today. Personal review of the ekg ordered shows sinus rhythm, PVCs  Recent Labs: 05/29/2019: Magnesium 2.1 06/11/2019: TSH 1.91 07/23/2019: ALT 23 08/02/2019: Pro B Natriuretic peptide (BNP) 446.0 11/12/2019: Hemoglobin 10.6; Platelets 208 11/26/2019: B Natriuretic Peptide 349.6; BUN 34; Creatinine, Ser 2.16; Potassium 4.7; Sodium 139    Lipid Panel     Component Value Date/Time   CHOL 134 06/11/2019 1106   TRIG 83.0 06/11/2019 1106   HDL 41.50 06/11/2019 1106   CHOLHDL 3 06/11/2019 1106   VLDL 16.6 06/11/2019 1106   LDLCALC 76 06/11/2019 1106   LDLDIRECT 84.0  05/02/2018 1523     Wt Readings from Last 3 Encounters:  12/24/19 183 lb (83 kg)  12/24/19 182 lb 9.6 oz (82.8 kg)  12/14/19 182 lb 6 oz (82.7 kg)      Other studies Reviewed: Additional studies/ records that were reviewed today include: TTE 12/12/2019 Review of the above records today demonstrates:  1. Left ventricular ejection fraction, by estimation, is 35 to  40%. The  left ventricle has moderately decreased function. The left ventricle  demonstrates global hypokinesis. Indeterminate diastolic filling due to  E-A fusion.  2. Right ventricular systolic function is mildly reduced. The right  ventricular size is mildly enlarged. There is moderately elevated  pulmonary artery systolic pressure. The estimated right ventricular  systolic pressure is 32.3 mmHg.  3. Left atrial size was moderately dilated.  4. The mitral valve is grossly normal. Mild mitral valve regurgitation.  No evidence of mitral stenosis.  5. The aortic valve is tricuspid. Aortic valve regurgitation is mild to  moderate. No aortic stenosis is present.  6. Pulmonic valve regurgitation is moderate.  7. Aortic dilatation noted. Aneurysm of the aortic root, measuring 45 mm.  8. The inferior vena cava is normal in size with greater than 50%  respiratory variability, suggesting right atrial pressure of 3 mmHg.   ASSESSMENT AND PLAN:  1.  Paroxysmal atrial fibrillation/flutter: Currently on amiodarone and Eliquis.  Status post AF ablation 05/16/2019.  He has had further episodes of atrial fibrillation.  He may require repeat ablation, as he had a partial ablation previously.  He went into acute systolic heart failure during ablation.  Since then, he has had 1 further episode of atrial fibrillation.  He is having some tremors that he feels is due to his amiodarone.  Due to that, we Danielle Lento hold off on his amiodarone for now.  If he goes back into atrial fibrillation, he would be a candidate for either repeat ablation or  other antiarrhythmic such as dofetilide.  Monitoring was performed today for high risk medication, amiodarone.  2.  Chronic systolic and diastolic heart failure: Currently on optimal medical therapy with Toprol-XL, Imdur, hydralazine.  Ejection fraction 35 to 40%.  This has remained stable over multiple echoes.  No changes.  3.  Hypertension: Well-controlled  Current medicines are reviewed at length with the patient today.   The patient does not have concerns regarding his medicines.  The following changes were made today: None  Labs/ tests ordered today include:  Orders Placed This Encounter  Procedures  . EKG 12-Lead     Disposition:   FU with Zriyah Kopplin 3 months  Signed, Kailand Seda Meredith [name], MD  12/24/2019 3:35 PM     [address] [place] [address] [phone] (office) [phone] (fax)

## 2019-12-24 NOTE — Patient Instructions (Addendum)
Medication Instructions:  Your physician has recommended you make the following change in your medication:  1. STOP Amiodarone  *If you need a refill on your cardiac medications before your next appointment, please call your pharmacy*   Lab Work: None ordered   Testing/Procedures: None ordered   Follow-Up: At [facility], you and your health needs are our priority.  As part of our continuing mission to provide you with exceptional heart care, we have created designated Provider Care Teams.  These Care Teams include your primary Cardiologist (physician) and Advanced Practice Providers (APPs -  Physician Assistants and Nurse Practitioners) who all work together to provide you with the care you need, when you need it.  Your next appointment:   3 month(s)  The format for your next appointment:   In Person  Provider:   Allegra Lai, MD   Thank you for choosing [facility]!!   Trinidad Curet, RN [phone]    Other Instructions

## 2019-12-25 DIAGNOSIS — Z006 Encounter for examination for normal comparison and control in clinical research program: Secondary | ICD-10-CM

## 2019-12-25 NOTE — Research (Signed)
  Alleviate Research Study  Carelink transmission downloaded. No new events, will continue to monitor.

## 2020-01-01 ENCOUNTER — Encounter: Payer: Self-pay | Admitting: *Deleted

## 2020-01-01 DIAGNOSIS — Z006 Encounter for examination for normal comparison and control in clinical research program: Secondary | ICD-10-CM

## 2020-01-01 NOTE — Research (Signed)
I reviewed CARELINK this morning and saw that this patient has had 3 events of A.fib since 12-29-19;pt has an Alleviate Loop recorder. I have sent a staff message to both Dr. Sallyanne Kuster and Dr. Curt Bears regarding this finding. I called the patient to let him know that we would be following up on this data. [name] states that he has not been aware of any changes in his heart rhythm during this time. He has set his recorder to update at 0300 each day.

## 2020-01-02 DIAGNOSIS — I129 Hypertensive chronic kidney disease with stage 1 through stage 4 chronic kidney disease, or unspecified chronic kidney disease: Secondary | ICD-10-CM | POA: Diagnosis not present

## 2020-01-02 DIAGNOSIS — R821 Myoglobinuria: Secondary | ICD-10-CM | POA: Diagnosis not present

## 2020-01-02 DIAGNOSIS — N1832 Chronic kidney disease, stage 3b: Secondary | ICD-10-CM | POA: Diagnosis not present

## 2020-01-02 DIAGNOSIS — N2581 Secondary hyperparathyroidism of renal origin: Secondary | ICD-10-CM | POA: Diagnosis not present

## 2020-01-02 DIAGNOSIS — J449 Chronic obstructive pulmonary disease, unspecified: Secondary | ICD-10-CM | POA: Diagnosis not present

## 2020-01-02 DIAGNOSIS — E1122 Type 2 diabetes mellitus with diabetic chronic kidney disease: Secondary | ICD-10-CM | POA: Diagnosis not present

## 2020-01-03 ENCOUNTER — Ambulatory Visit (HOSPITAL_COMMUNITY)
Admission: RE | Admit: 2020-01-03 | Discharge: 2020-01-03 | Disposition: A | Discharge status: Home / Self Care | Payer: Medicare HMO | Source: Ambulatory Visit | Attending: Physician Assistant | Admitting: Physician Assistant

## 2020-01-03 ENCOUNTER — Encounter (HOSPITAL_COMMUNITY): Payer: Self-pay | Admitting: Physician Assistant

## 2020-01-03 ENCOUNTER — Other Ambulatory Visit: Payer: Self-pay

## 2020-01-03 VITALS — BP 134/80 | HR 87 | Ht 71.0 in | Wt 186.6 lb

## 2020-01-03 DIAGNOSIS — Z7901 Long term (current) use of anticoagulants: Secondary | ICD-10-CM | POA: Insufficient documentation

## 2020-01-03 DIAGNOSIS — E785 Hyperlipidemia, unspecified: Secondary | ICD-10-CM | POA: Diagnosis not present

## 2020-01-03 DIAGNOSIS — I130 Hypertensive heart and chronic kidney disease with heart failure and stage 1 through stage 4 chronic kidney disease, or unspecified chronic kidney disease: Secondary | ICD-10-CM | POA: Diagnosis not present

## 2020-01-03 DIAGNOSIS — I5022 Chronic systolic (congestive) heart failure: Secondary | ICD-10-CM | POA: Insufficient documentation

## 2020-01-03 DIAGNOSIS — N1832 Chronic kidney disease, stage 3b: Secondary | ICD-10-CM | POA: Insufficient documentation

## 2020-01-03 DIAGNOSIS — Z8249 Family history of ischemic heart disease and other diseases of the circulatory system: Secondary | ICD-10-CM | POA: Insufficient documentation

## 2020-01-03 DIAGNOSIS — Z79899 Other long term (current) drug therapy: Secondary | ICD-10-CM | POA: Insufficient documentation

## 2020-01-03 DIAGNOSIS — D6869 Other thrombophilia: Secondary | ICD-10-CM

## 2020-01-03 DIAGNOSIS — G4733 Obstructive sleep apnea (adult) (pediatric): Secondary | ICD-10-CM | POA: Insufficient documentation

## 2020-01-03 DIAGNOSIS — Z87891 Personal history of nicotine dependence: Secondary | ICD-10-CM | POA: Diagnosis not present

## 2020-01-03 DIAGNOSIS — I4892 Unspecified atrial flutter: Secondary | ICD-10-CM | POA: Insufficient documentation

## 2020-01-03 DIAGNOSIS — R011 Cardiac murmur, unspecified: Secondary | ICD-10-CM | POA: Diagnosis not present

## 2020-01-03 DIAGNOSIS — E118 Type 2 diabetes mellitus with unspecified complications: Secondary | ICD-10-CM | POA: Diagnosis not present

## 2020-01-03 DIAGNOSIS — I483 Typical atrial flutter: Secondary | ICD-10-CM

## 2020-01-03 DIAGNOSIS — I4819 Other persistent atrial fibrillation: Secondary | ICD-10-CM | POA: Insufficient documentation

## 2020-01-03 DIAGNOSIS — Z7984 Long term (current) use of oral hypoglycemic drugs: Secondary | ICD-10-CM | POA: Insufficient documentation

## 2020-01-03 MED ORDER — METOPROLOL SUCCINATE ER 25 MG PO TB24: 25.0000 mg | ORAL_TABLET | Freq: Two times a day (BID) | ORAL | 3 refills | Status: DC

## 2020-01-03 NOTE — Progress Notes (Signed)
Thanks I will call him 

## 2020-01-03 NOTE — Progress Notes (Signed)
Primary Care Physician: [name], MD Primary Cardiologist: Dr Sallyanne Kuster Primary Electrophysiologist: Dr Curt Bears [facility]: Dr Haroldine Laws Referring Physician: Dr Alanson Aly is a 69 y.o. 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 Admitted 05/16/19 for PVI with successful procedure. Post procedure he developed cardiogenic shock and respiratory failure. CCM consulted for intubation. Advanced HF team consulted. As he improved he was extubated. Treated with pressors and gradually weaned off. Was discharged home on 05/29/19. During his admission his dofetilide was changed to amiodarone 2/2 renal insufficiency. He is on Eliquis for a CHADS2VASC score of 3.   On follow up today, research team alert for increased afib burden on ILR. Patient very surprised as he had no symptoms of afib. He states that he has been having "some of his best days recently." He has been very active and doing well losing weight. There were no specific triggers that the patient could identify.   Today, he denies symptoms of palpitations, chest pain, shortness of breath, orthopnea, PND, lower extremity edema, dizziness, presyncope, syncope, bleeding, or neurologic sequela. The patient is tolerating medications without difficulties and is otherwise without complaint today.    Atrial Fibrillation Risk Factors:  he does have symptoms or diagnosis of sleep apnea. he is compliant with CPAP therapy.   he has a BMI of Body mass index is 26.03 kg/m.[name] Filed Weights   01/03/20 1440  Weight: 84.6 kg    Family History  Problem Relation Age of Onset  . Clotting disorder Mother   . Heart disease Mother        s/p MI  . Heart attack Mother   . Hypertension Mother   . Diabetes Mother   . Hyperlipidemia Mother   . Stroke Mother   . Cancer Father        ? lung  .  Lung disease Father        smoker  . Diabetes Sister   . Hypertension Sister        smoker  . Leukemia Maternal Grandmother        ?  [name] Aneurysm Sister        brain  . Other Sister        clipped  . Seizures Sister        d/o w/aneurysm/ smoker     Atrial Fibrillation Management history:  Previous antiarrhythmic drugs: dofetilide, amiodarone Previous cardioversions: 2016, 2019 x2, 02/2019 Previous ablations: 05/16/19 CHADS2VASC score: 3 Anticoagulation history: Eliquis   Past Medical History:  Diagnosis Date  . AORTIC STENOSIS, MODERATE 12/05/2009  . Atrial fibrillation ([place])   . Atrial fibrillation with RVR ([place]) 07/05/2017  . Atrial flutter ([place]) 12/05/2009  . Atypical chest pain 11/08/2011  . BENIGN PROSTATIC HYPERTROPHY, HX OF 12/05/2009  . CHEST PAIN-UNSPECIFIED 11/11/2009  . CHF 12/05/2009  . Chronic renal insufficiency 12/16/2016  . Debility 04/18/2012  . Diarrhea 12/16/2016  . DYSPNEA 12/19/2009  . Edema 04/18/2012  . ERECTILE DYSFUNCTION 01/25/2007  . FATIGUE, CHRONIC 11/11/2009  . Gout 07/10/2012  . Heart murmur   . History of kidney stones    "they passed" (07/05/2017)  . Hyperkalemia 04/18/2012  . Hyperlipidemia   . Hypertension   . HYPERTENSION 01/25/2007  . Hypoxia 05/05/2011  . Insomnia 05/13/2016  . INSOMNIA, HX OF 01/25/2007  . Knee pain, bilateral 12/28/2010  .  Knee pain, right 12/28/2010  . NEUROMA 01/05/2010  . OBESITY NOS 01/25/2007  . OSA on CPAP 04/06/2010  . PERIPHERAL NEUROPATHY 01/05/2010  . PULMONARY FUNCTION TESTS, ABNORMAL 02/02/2010  . Superficial thrombophlebitis of left leg 09/07/2011  . TESTICULAR HYPOFUNCTION 01/05/2010  . 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 Toe pain, right 07/10/2012  . Type II diabetes mellitus ([place]) 12/05/2009  . UNSPECIFIED ANEMIA 01/05/2010   Past Surgical History:  Procedure Laterality Date  . A-FLUTTER ABLATION N/A 05/16/2019   Procedure: A-FLUTTER ABLATION;  Surgeon: Constance Haw, MD;  Location: [place] [facility];  Service:  Cardiovascular;  Laterality: N/A;  . ATRIAL FIBRILLATION ABLATION N/A 05/16/2019   Procedure: ATRIAL FIBRILLATION ABLATION;  Surgeon: Constance Haw, MD;  Location: [place] [facility];  Service: Cardiovascular;  Laterality: N/A;  . CARDIAC CATHETERIZATION  10/16/2009   nonischemic cardiomyopathy  . CARDIOVERSION N/A 03/25/2015   Procedure: CARDIOVERSION;  Surgeon: Pixie Casino, MD;  Location: [facility];  Service: Cardiovascular;  Laterality: N/A;  . CARDIOVERSION N/A 06/17/2017   Procedure: CARDIOVERSION;  Surgeon: Sanda Klein, MD;  Location: [place] [facility];  Service: Cardiovascular;  Laterality: N/A;  . CARDIOVERSION N/A 07/08/2017   Procedure: CARDIOVERSION;  Surgeon: Fay Records, MD;  Location: [facility];  Service: Cardiovascular;  Laterality: N/A;  . CARDIOVERSION N/A 02/27/2019   Procedure: CARDIOVERSION;  Surgeon: Sanda Klein, MD;  Location: [facility];  Service: Cardiovascular;  Laterality: N/A;  . CARDIOVERSION N/A 08/16/2019   Procedure: CARDIOVERSION;  Surgeon: Sanda Klein, MD;  Location: [facility];  Service: Cardiovascular;  Laterality: N/A;  . METATARSAL OSTEOTOMY Bilateral ~ 1980   removed part of 5th metatarsal to corect curvature of toes   . RIGHT HEART CATH N/A 05/16/2019   Procedure: RIGHT HEART CATH;  Surgeon: Jolaine Artist, MD;  Location: [place] [facility];  Service: Cardiovascular;  Laterality: N/A;  . TEE WITHOUT CARDIOVERSION  05/16/2019   Procedure: Transesophageal Echocardiogram (Tee);  Surgeon: Constance Haw, MD;  Location: [place] [facility];  Service: Cardiovascular;;    Current Outpatient Medications  Medication Sig Dispense Refill  . albuterol (VENTOLIN HFA) 108 (90 Base) MCG/ACT inhaler Inhale 2 puffs into the lungs every 6 (six) hours as needed for wheezing or shortness of breath. 1 Inhaler 6  . allopurinol (ZYLOPRIM) 300 MG tablet Take 1 tablet by mouth once daily 90 tablet 0  . Blood Glucose Monitoring Suppl (ONETOUCH  VERIO) w/Device KIT Use to check blood sugar TID as directed.  Dx Code: E11.9 1 kit 0  . calcium carbonate (OS-CAL) 1250 (500 Ca) MG chewable tablet Chew by mouth.    . cetirizine (ZYRTEC) 10 MG tablet Take 1 tablet (10 mg total) by mouth daily. 30 tablet 11  . Clobetasol Prop Emollient Base (CLOBETASOL PROPIONATE E) 0.05 % emollient cream Apply 1 application topically 2 (two) times daily as needed (insect bite). 60 g 1  . colchicine (COLCRYS) 0.6 MG tablet TAKE ONE TABLET BY MOUTH DAILY AS NEEDED FOR GOUT FLARE UPS. 30 tablet 0  . dapagliflozin propanediol (FARXIGA) 10 MG TABS tablet Take 1 tablet (10 mg total) by mouth daily before breakfast. 30 tablet 6  . ELIQUIS 5 MG TABS tablet Take 1 tablet by mouth twice daily 60 tablet 10  . fish oil-omega-3 fatty acids 1000 MG capsule Take 1 g by mouth daily.     . furosemide (LASIX) 80 MG tablet Take 1 tablet (80 mg total) by mouth daily. 30 tablet 6  . glucose blood  test strip (Verio) Use as instructed Check blood sugars TID DxE11.9 100 each 12  . glyBURIDE (DIABETA) 5 MG tablet Take 5 mg by mouth as needed.     . hydrALAZINE (APRESOLINE) 25 MG tablet Take 1 tablet (25 mg total) by mouth 3 (three) times daily. 90 tablet 2  . HYDROcodone-acetaminophen (NORCO) 10-325 MG tablet Take 1 tablet by mouth every 8 (eight) hours as needed for moderate pain or severe pain. 90 tablet 0  . hydrocortisone 2.5 % ointment Apply topically 2 (two) times daily as needed. 30 g 1  . isosorbide mononitrate (IMDUR) 30 MG 24 hr tablet Take 1 tablet (30 mg total) by mouth daily. 30 tablet 11  . LORazepam (ATIVAN) 1 MG tablet Take 1 tablet (1 mg total) by mouth every 8 (eight) hours as needed for anxiety. 30 tablet 5  . magnesium oxide (MAG-OX) 400 MG tablet Take 1 tablet (400 mg total) by mouth 2 (two) times daily. 30 tablet 11  . metoprolol succinate (TOPROL-XL) 25 MG 24 hr tablet Take 1 tablet (25 mg total) by mouth 2 (two) times daily. 90 tablet 3  . Multiple Vitamin  (MULTIVITAMIN WITH MINERALS) TABS tablet Take 1 tablet by mouth daily.     . naproxen (NAPROSYN) 375 MG tablet Take 375 mg by mouth 2 (two) times daily as needed (knee pain.).     [name] potassium chloride SA (KLOR-CON) 20 MEQ tablet Take 1 tablet (20 mEq total) by mouth as directed. 1 tablet daily 20 mEq by mouth as directed per alleviate research protocol. 180 tablet 1  . pravastatin (PRAVACHOL) 40 MG tablet Take 1 tablet by mouth once daily 90 tablet 3  . tamsulosin (FLOMAX) 0.4 MG CAPS capsule Take 1 capsule by mouth once daily 30 capsule 0  . temazepam (RESTORIL) 30 MG capsule Take 1 capsule (30 mg total) by mouth at bedtime. 30 capsule 5   No current facility-administered medications for this encounter.    Allergies  Allergen Reactions  . Sulfonamide Derivatives Other (See Comments)    unknown reaction.  Patient states had to go to the ER.   [name] Zolpidem Other (See Comments)    Excessive, prolonged sedation    Social History   Socioeconomic History  . Marital status: Married    Spouse name: Not on file  . Number of children: 4  . Years of education: 72  . Highest education level: High school graduate  Occupational History  . Occupation: retired    Fish farm manager: [employer]  Tobacco Use  . Smoking status: Former Smoker    Packs/day: 1.00    Years: 10.00    Pack years: 10.00    Quit date: 06/07/1980    Years since quitting: 39.6  . Smokeless tobacco: Never Used  Vaping Use  . Vaping Use: Never used  Substance and Sexual Activity  . Alcohol use: No  . Drug use: No  . Sexual activity: Not Currently  Other Topics Concern  . Not on file  Social History Narrative   Lives with male partner in a one story home.  His son lives there off and on.  Retired from [employer].  Education: high school.  Right handed   Social Determinants of Health   Financial Resource Strain:   . Difficulty of Paying Living Expenses:   Food Insecurity:   . Worried About Charity fundraiser in the Last  Year:   . Arboriculturist in the Last Year:   Transportation Needs:   .  Lack of Transportation (Medical):   [name] Lack of Transportation (Non-Medical):   Physical Activity:   . Days of Exercise per Week:   . Minutes of Exercise per Session:   Stress:   . Feeling of Stress :   Social Connections:   . Frequency of Communication with Friends and Family:   . Frequency of Social Gatherings with Friends and Family:   . Attends Religious Services:   . Active Member of Clubs or Organizations:   . Attends Archivist Meetings:   [name] Marital Status:   Intimate Partner Violence:   . Fear of Current or Ex-Partner:   . Emotionally Abused:   [name] Physically Abused:   . Sexually Abused:      ROS- All systems are reviewed and negative except as per the HPI above.  Physical Exam: Vitals:   01/03/20 1440  BP: (!) 134/80  Pulse: 87  Weight: 84.6 kg  Height: _0  (1.803 m)    GEN- The patient is well appearing, alert and oriented x 3 today.   HEENT-head normocephalic, atraumatic, sclera clear, conjunctiva pink, hearing intact, trachea midline. Lungs- Clear to ausculation bilaterally, normal work of breathing Heart- irregular rate and rhythm, no murmurs, rubs or gallops  GI- soft, NT, ND, + BS Extremities- no clubbing, cyanosis, or edema MS- no significant deformity or atrophy Skin- no rash or lesion Psych- euthymic mood, full affect Neuro- strength and sensation are intact   Wt Readings from Last 3 Encounters:  01/03/20 84.6 kg  12/24/19 83 kg  12/24/19 82.8 kg    EKG today demonstrates atrial flutter with variable block, HR 87, QRS 110, QTc 476  Echo 05/28/19 demonstrated   1. Left ventricular ejection fraction, by visual estimation, is 40 to 45%. The left ventricle has moderately decreased function. There is mildly increased left ventricular hypertrophy.  2. Left ventricular diastolic parameters are indeterminate.  3. The left ventricle demonstrates global hypokinesis.  4.  Global right ventricle has normal systolic function.The right ventricular size is normal. No increase in right ventricular wall thickness.  5. Left atrial size was normal.  6. Right atrial size was normal.  7. The mitral valve is normal in structure. Trivial mitral valve regurgitation. No evidence of mitral stenosis.  8. The tricuspid valve is normal in structure. Tricuspid valve regurgitation is trivial.  9. Aortic valve regurgitation is moderate. 10. The aortic valve is normal in structure. Aortic valve regurgitation is moderate. No evidence of aortic valve sclerosis or stenosis. 11. Pulmonic regurgitation is moderate. 12. The pulmonic valve was normal in structure. Pulmonic valve regurgitation is moderate. 13. The inferior vena cava is normal in size with greater than 50% respiratory variability, suggesting right atrial pressure of 3 mmHg.  Epic records are reviewed at length today  Assessment and Plan:  1. Persistent atrial fibrillation/atrial flutter S/p afib and atrial flutter ablation with Dr Curt Bears 92/4/26 complicated by cardiogenic shock.  ILR showed 3 episodes of afib. He is in atrial flutter today. Recall amiodarone stopped 2/2 tremors and dofetilide stopped 2/2 renal insufficiency.  We discussed therapeutic options today including resuming AAD vs repeat ablation. Patient is understandably anxious about repeat ablation. We discussed dofetilide although with his current CrCl he would only be able to take 125 mcg BID. Will increase Toprol to 25 mg BID for now and monitor on ILR. Continue Eliquis 5 mg BID  This patients CHA2DS2-VASc Score and unadjusted Ischemic Stroke Rate (% per year) is equal to 3.2 % stroke rate/year from  a score of 3  Above score calculated as 1 point each if present [CHF, HTN, DM, Vascular=MI/PAD/Aortic Plaque, Age if 65-74, or Male] Above score calculated as 2 points each if present [Age > 75, or Stroke/TIA/TE]  2. Obstructive sleep apnea Patient  reports compliance with CPAP therapy.  3. Chronic systolic CHF EF improved to 35-40% No signs or symptoms of fluid overload today.  4. HTN Stable, no changes today.   Follow up in the AF clinic in 2 weeks.    [facility] [address] [place], [place] [address] [phone] 01/03/2020 4:47 PM

## 2020-01-03 NOTE — Progress Notes (Signed)
Is he candidate for repeat ablation for AFL? If so, I would favor that. Shock wasn't due to ablation. He presented in shock due to AF/AFL

## 2020-01-03 NOTE — Patient Instructions (Signed)
Increase metoprolol to 25mg twice a day

## 2020-01-04 NOTE — Progress Notes (Signed)
I spoke with him at length last night and explained why a repeat ablation is indicated. He wants to make sure we have pursued all the possible conservative measures first. I explained ( and I believe he understands) that his previous long hospitalization was due to the severe degree of HF decompensation that he had reached before the ablation and not due to the ablation itself. He is nevertheless wary of invasive procedures. Also pointed out that we want to intervene before he has recurrence of severe CHF decompensation. If we engage him in the decision making process and explain why ablation would be superior to drug therapy, I am sure he will agree to redo ablation.

## 2020-01-05 NOTE — Progress Notes (Signed)
Mihai - I couldn't agree more. This is very helpful. Let me know if it would help if I reach out to him as well. -dan

## 2020-01-13 DIAGNOSIS — G4733 Obstructive sleep apnea (adult) (pediatric): Secondary | ICD-10-CM | POA: Diagnosis not present

## 2020-01-14 DIAGNOSIS — Z006 Encounter for examination for normal comparison and control in clinical research program: Secondary | ICD-10-CM

## 2020-01-14 NOTE — Research (Addendum)
Alleviate HF Study Care Link Transmission  Persistent atrial fibrillation with adequate rate control for last 2 weeks.

## 2020-01-15 DIAGNOSIS — Z006 Encounter for examination for normal comparison and control in clinical research program: Secondary | ICD-10-CM

## 2020-01-15 NOTE — Research (Signed)
Alleviate HF Research Study  8 Month Phone Visit  Patient doing well at this time, no adverse events or medication changes at this time. Research will continue to monitor patient for any events. The patient is using his commander Flex System daily: This mornings weight was 180.8 lbs HR 78 BP 103/71.   Current Outpatient Medications:  .  albuterol (VENTOLIN HFA) 108 (90 Base) MCG/ACT inhaler, Inhale 2 puffs into the lungs every 6 (six) hours as needed for wheezing or shortness of breath., Disp: 1 Inhaler, Rfl: 6 .  allopurinol (ZYLOPRIM) 300 MG tablet, Take 1 tablet by mouth once daily, Disp: 90 tablet, Rfl: 0 .  Blood Glucose Monitoring Suppl (ONETOUCH VERIO) w/Device KIT, Use to check blood sugar TID as directed.  Dx Code: E11.9, Disp: 1 kit, Rfl: 0 .  calcium carbonate (OS-CAL) 1250 (500 Ca) MG chewable tablet, Chew by mouth., Disp: , Rfl:  .  cetirizine (ZYRTEC) 10 MG tablet, Take 1 tablet (10 mg total) by mouth daily., Disp: 30 tablet, Rfl: 11 .  Clobetasol Prop Emollient Base (CLOBETASOL PROPIONATE E) 0.05 % emollient cream, Apply 1 application topically 2 (two) times daily as needed (insect bite)., Disp: 60 g, Rfl: 1 .  colchicine (COLCRYS) 0.6 MG tablet, TAKE ONE TABLET BY MOUTH DAILY AS NEEDED FOR GOUT FLARE UPS., Disp: 30 tablet, Rfl: 0 .  dapagliflozin propanediol (FARXIGA) 10 MG TABS tablet, Take 1 tablet (10 mg total) by mouth daily before breakfast., Disp: 30 tablet, Rfl: 6 .  ELIQUIS 5 MG TABS tablet, Take 1 tablet by mouth twice daily, Disp: 60 tablet, Rfl: 10 .  fish oil-omega-3 fatty acids 1000 MG capsule, Take 1 g by mouth daily. , Disp: , Rfl:  .  furosemide (LASIX) 80 MG tablet, Take 1 tablet (80 mg total) by mouth daily., Disp: 30 tablet, Rfl: 6 .  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  glyBURIDE (DIABETA) 5 MG tablet, Take 5 mg by mouth as needed. , Disp: , Rfl:  .  hydrALAZINE (APRESOLINE) 25 MG  tablet, Take 1 tablet (25 mg total) by mouth 3 (three) times daily., Disp: 90 tablet, Rfl: 2 .  HYDROcodone-acetaminophen (NORCO) 10-325 MG tablet, Take 1 tablet by mouth every 8 (eight) hours as needed for moderate pain or severe pain., Disp: 90 tablet, Rfl: 0 .  hydrocortisone 2.5 % ointment, Apply topically 2 (two) times daily as needed., Disp: 30 g, Rfl: 1 .  isosorbide mononitrate (IMDUR) 30 MG 24 hr tablet, Take 1 tablet (30 mg total) by mouth daily., Disp: 30 tablet, Rfl: 11 .  LORazepam (ATIVAN) 1 MG tablet, Take 1 tablet (1 mg total) by mouth every 8 (eight) hours as needed for anxiety., Disp: 30 tablet, Rfl: 5 .  magnesium oxide (MAG-OX) 400 MG tablet, Take 1 tablet (400 mg total) by mouth 2 (two) times daily., Disp: 30 tablet, Rfl: 11 .  metoprolol succinate (TOPROL-XL) 25 MG 24 hr tablet, Take 1 tablet (25 mg total) by mouth 2 (two) times daily., Disp: 90 tablet, Rfl: 3 .  Multiple Vitamin (MULTIVITAMIN WITH MINERALS) TABS tablet, Take 1 tablet by mouth daily. , Disp: , Rfl:  .  naproxen (NAPROSYN) 375 MG tablet, Take 375 mg by mouth 2 (two) times daily as needed (knee pain.). , Disp: , Rfl:  .  potassium chloride SA (KLOR-CON) 20 MEQ tablet, Take 1 tablet (20 mEq total) by mouth as directed. 1 tablet daily 20  mEq by mouth as directed per alleviate research protocol., Disp: 180 tablet, Rfl: 1 .  pravastatin (PRAVACHOL) 40 MG tablet, Take 1 tablet by mouth once daily, Disp: 90 tablet, Rfl: 3 .  tamsulosin (FLOMAX) 0.4 MG CAPS capsule, Take 1 capsule by mouth once daily, Disp: 30 capsule, Rfl: 0 .  temazepam (RESTORIL) 30 MG capsule, Take 1 capsule (30 mg total) by mouth at bedtime., Disp: 30 capsule, Rfl: 5

## 2020-01-16 NOTE — Progress Notes (Signed)
Primary Care Physician: [name], MD Primary Cardiologist: Dr Sallyanne Kuster Primary Electrophysiologist: Dr Curt Bears [facility]: Dr Haroldine Laws Referring Physician: Dr Alanson Aly is a 69 y.o. 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 Admitted 05/16/19 for PVI with successful procedure. Post procedure he developed cardiogenic shock and respiratory failure. CCM consulted for intubation. Advanced HF team consulted. As he improved he was extubated. Treated with pressors and gradually weaned off. Was discharged home on 05/29/19. During his admission his dofetilide was changed to amiodarone 2/2 renal insufficiency. He is on Eliquis for a CHADS2VASC score of 3. The research team received an alert for increased afib burden on ILR 01/01/20. Patient very surprised as he had no symptoms of afib.   On follow up today, patient reports that he has done well since his last visit. ILR shows he has been persistently out of rhythm for 2 weeks. He denies SOB, edema, or any awareness of his arrhythmia.   Today, he denies symptoms of palpitations, chest pain, shortness of breath, orthopnea, PND, lower extremity edema, dizziness, presyncope, syncope, bleeding, or neurologic sequela. The patient is tolerating medications without difficulties and is otherwise without complaint today.    Atrial Fibrillation Risk Factors:  he does have symptoms or diagnosis of sleep apnea. he is compliant with CPAP therapy.   he has a BMI of Body mass index is 26.44 kg/m.[name] Filed Weights   01/17/20 0910  Weight: 86 kg    Family History  Problem Relation Age of Onset  . Clotting disorder Mother   . Heart disease Mother        s/p MI  . Heart attack Mother   . Hypertension Mother   . Diabetes Mother   . Hyperlipidemia Mother   . Stroke Mother   . Cancer Father         ? lung  . Lung disease Father        smoker  . Diabetes Sister   . Hypertension Sister        smoker  . Leukemia Maternal Grandmother        ?  [name] Aneurysm Sister        brain  . Other Sister        clipped  . Seizures Sister        d/o w/aneurysm/ smoker     Atrial Fibrillation Management history:  Previous antiarrhythmic drugs: dofetilide, amiodarone Previous cardioversions: 2016, 2019 x2, 02/2019 Previous ablations: 05/16/19 CHADS2VASC score: 3 Anticoagulation history: Eliquis   Past Medical History:  Diagnosis Date  . AORTIC STENOSIS, MODERATE 12/05/2009  . Atrial fibrillation ([place])   . Atrial fibrillation with RVR ([place]) 07/05/2017  . Atrial flutter ([place]) 12/05/2009  . Atypical chest pain 11/08/2011  . BENIGN PROSTATIC HYPERTROPHY, HX OF 12/05/2009  . CHEST PAIN-UNSPECIFIED 11/11/2009  . CHF 12/05/2009  . Chronic renal insufficiency 12/16/2016  . Debility 04/18/2012  . Diarrhea 12/16/2016  . DYSPNEA 12/19/2009  . Edema 04/18/2012  . ERECTILE DYSFUNCTION 01/25/2007  . FATIGUE, CHRONIC 11/11/2009  . Gout 07/10/2012  . Heart murmur   . History of kidney stones    "they passed" (07/05/2017)  . Hyperkalemia 04/18/2012  . Hyperlipidemia   . Hypertension   . HYPERTENSION 01/25/2007  . Hypoxia 05/05/2011  . Insomnia 05/13/2016  . INSOMNIA, HX OF 01/25/2007  . Knee  pain, bilateral 12/28/2010  . Knee pain, right 12/28/2010  . NEUROMA 01/05/2010  . OBESITY NOS 01/25/2007  . OSA on CPAP 04/06/2010  . PERIPHERAL NEUROPATHY 01/05/2010  . PULMONARY FUNCTION TESTS, ABNORMAL 02/02/2010  . Superficial thrombophlebitis of left leg 09/07/2011  . TESTICULAR HYPOFUNCTION 01/05/2010  . 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 Toe pain, right 07/10/2012  . Type II diabetes mellitus ([place]) 12/05/2009  . UNSPECIFIED ANEMIA 01/05/2010   Past Surgical History:  Procedure Laterality Date  . A-FLUTTER ABLATION N/A 05/16/2019   Procedure: A-FLUTTER ABLATION;  Surgeon: Constance Haw, MD;  Location: [place] [facility];   Service: Cardiovascular;  Laterality: N/A;  . ATRIAL FIBRILLATION ABLATION N/A 05/16/2019   Procedure: ATRIAL FIBRILLATION ABLATION;  Surgeon: Constance Haw, MD;  Location: [place] [facility];  Service: Cardiovascular;  Laterality: N/A;  . CARDIAC CATHETERIZATION  10/16/2009   nonischemic cardiomyopathy  . CARDIOVERSION N/A 03/25/2015   Procedure: CARDIOVERSION;  Surgeon: Pixie Casino, MD;  Location: [place];  Service: Cardiovascular;  Laterality: N/A;  . CARDIOVERSION N/A 06/17/2017   Procedure: CARDIOVERSION;  Surgeon: Sanda Klein, MD;  Location: [place] [facility];  Service: Cardiovascular;  Laterality: N/A;  . CARDIOVERSION N/A 07/08/2017   Procedure: CARDIOVERSION;  Surgeon: Fay Records, MD;  Location: [facility];  Service: Cardiovascular;  Laterality: N/A;  . CARDIOVERSION N/A 02/27/2019   Procedure: CARDIOVERSION;  Surgeon: Sanda Klein, MD;  Location: [facility];  Service: Cardiovascular;  Laterality: N/A;  . CARDIOVERSION N/A 08/16/2019   Procedure: CARDIOVERSION;  Surgeon: Sanda Klein, MD;  Location: [facility];  Service: Cardiovascular;  Laterality: N/A;  . METATARSAL OSTEOTOMY Bilateral ~ 1980   removed part of 5th metatarsal to corect curvature of toes   . RIGHT HEART CATH N/A 05/16/2019   Procedure: RIGHT HEART CATH;  Surgeon: Jolaine Artist, MD;  Location: [place] [facility];  Service: Cardiovascular;  Laterality: N/A;  . TEE WITHOUT CARDIOVERSION  05/16/2019   Procedure: Transesophageal Echocardiogram (Tee);  Surgeon: Constance Haw, MD;  Location: [place] [facility];  Service: Cardiovascular;;    Current Outpatient Medications  Medication Sig Dispense Refill  . albuterol (VENTOLIN HFA) 108 (90 Base) MCG/ACT inhaler Inhale 2 puffs into the lungs every 6 (six) hours as needed for wheezing or shortness of breath. 1 Inhaler 6  . allopurinol (ZYLOPRIM) 300 MG tablet Take 1 tablet by mouth once daily 90 tablet 0  . Blood Glucose Monitoring Suppl  (ONETOUCH VERIO) w/Device KIT Use to check blood sugar TID as directed.  Dx Code: E11.9 1 kit 0  . calcium carbonate (OS-CAL) 1250 (500 Ca) MG chewable tablet Chew by mouth.    . cetirizine (ZYRTEC) 10 MG tablet Take 1 tablet (10 mg total) by mouth daily. 30 tablet 11  . Clobetasol Prop Emollient Base (CLOBETASOL PROPIONATE E) 0.05 % emollient cream Apply 1 application topically 2 (two) times daily as needed (insect bite). 60 g 1  . colchicine (COLCRYS) 0.6 MG tablet TAKE ONE TABLET BY MOUTH DAILY AS NEEDED FOR GOUT FLARE UPS. 30 tablet 0  . dapagliflozin propanediol (FARXIGA) 10 MG TABS tablet Take 1 tablet (10 mg total) by mouth daily before breakfast. 30 tablet 6  . ELIQUIS 5 MG TABS tablet Take 1 tablet by mouth twice daily 60 tablet 10  . fish oil-omega-3 fatty acids 1000 MG capsule Take 1 g by mouth daily.     . furosemide (LASIX) 80 MG tablet Take 1 tablet (80 mg total) by mouth daily. 30 tablet  6  . glucose blood test strip (Verio) Use as instructed Check blood sugars TID DxE11.9 100 each 12  . glyBURIDE (DIABETA) 5 MG tablet Take 5 mg by mouth as needed.     . hydrALAZINE (APRESOLINE) 25 MG tablet Take 1 tablet (25 mg total) by mouth 3 (three) times daily. 90 tablet 2  . HYDROcodone-acetaminophen (NORCO) 10-325 MG tablet Take 1 tablet by mouth every 8 (eight) hours as needed for moderate pain or severe pain. 90 tablet 0  . hydrocortisone 2.5 % ointment Apply topically 2 (two) times daily as needed. 30 g 1  . isosorbide mononitrate (IMDUR) 30 MG 24 hr tablet Take 1 tablet (30 mg total) by mouth daily. 30 tablet 11  . LORazepam (ATIVAN) 1 MG tablet Take 1 tablet (1 mg total) by mouth every 8 (eight) hours as needed for anxiety. 30 tablet 5  . magnesium oxide (MAG-OX) 400 MG tablet Take 1 tablet (400 mg total) by mouth 2 (two) times daily. 30 tablet 11  . metoprolol succinate (TOPROL-XL) 25 MG 24 hr tablet Take 1 tablet (25 mg total) by mouth 2 (two) times daily. 90 tablet 3  . Multiple  Vitamin (MULTIVITAMIN WITH MINERALS) TABS tablet Take 1 tablet by mouth daily.     . naproxen (NAPROSYN) 375 MG tablet Take 375 mg by mouth 2 (two) times daily as needed (knee pain.).     [name] potassium chloride SA (KLOR-CON) 20 MEQ tablet Take 1 tablet (20 mEq total) by mouth as directed. 1 tablet daily 20 mEq by mouth as directed per alleviate research protocol. 180 tablet 1  . pravastatin (PRAVACHOL) 40 MG tablet Take 1 tablet by mouth once daily 90 tablet 3  . tamsulosin (FLOMAX) 0.4 MG CAPS capsule Take 1 capsule by mouth once daily 30 capsule 0  . temazepam (RESTORIL) 30 MG capsule Take 1 capsule (30 mg total) by mouth at bedtime. 30 capsule 5   No current facility-administered medications for this encounter.    Allergies  Allergen Reactions  . Sulfonamide Derivatives Other (See Comments)    unknown reaction.  Patient states had to go to the ER.   [name] Zolpidem Other (See Comments)    Excessive, prolonged sedation    Social History   Socioeconomic History  . Marital status: Married    Spouse name: Not on file  . Number of children: 4  . Years of education: 5  . Highest education level: High school graduate  Occupational History  . Occupation: retired    Fish farm manager: [employer]  Tobacco Use  . Smoking status: Former Smoker    Packs/day: 1.00    Years: 10.00    Pack years: 10.00    Quit date: 06/07/1980    Years since quitting: 39.6  . Smokeless tobacco: Never Used  Vaping Use  . Vaping Use: Never used  Substance and Sexual Activity  . Alcohol use: No  . Drug use: No  . Sexual activity: Not Currently  Other Topics Concern  . Not on file  Social History Narrative   Lives with male partner in a one story home.  His son lives there off and on.  Retired from [employer].  Education: high school.  Right handed   Social Determinants of Health   Financial Resource Strain:   . Difficulty of Paying Living Expenses:   Food Insecurity:   . Worried About Charity fundraiser in the  Last Year:   . [place] in the Last  Year:   Transportation Needs:   . Film/video editor (Medical):   [name] Lack of Transportation (Non-Medical):   Physical Activity:   . Days of Exercise per Week:   . Minutes of Exercise per Session:   Stress:   . Feeling of Stress :   Social Connections:   . Frequency of Communication with Friends and Family:   . Frequency of Social Gatherings with Friends and Family:   . Attends Religious Services:   . Active Member of Clubs or Organizations:   . Attends Archivist Meetings:   [name] Marital Status:   Intimate Partner Violence:   . Fear of Current or Ex-Partner:   . Emotionally Abused:   [name] Physically Abused:   . Sexually Abused:      ROS- All systems are reviewed and negative except as per the HPI above.  Physical Exam: Vitals:   01/17/20 0910  BP: 108/76  Pulse: 88  Weight: 86 kg  Height: '5\' 11"'$  (1.803 m)    GEN- The patient is well appearing, alert and oriented x 3 today.   HEENT-head normocephalic, atraumatic, sclera clear, conjunctiva pink, hearing intact, trachea midline. Lungs- Clear to ausculation bilaterally, normal work of breathing Heart- irregular rate and rhythm, no rubs or gallops, 2/6 systolic murmur GI- soft, NT, ND, + BS Extremities- no clubbing, cyanosis, or edema MS- no significant deformity or atrophy Skin- no rash or lesion Psych- euthymic mood, full affect Neuro- strength and sensation are intact   Wt Readings from Last 3 Encounters:  01/17/20 86 kg  01/03/20 84.6 kg  12/24/19 83 kg    EKG today demonstrates atrial flutter with variable conduction HR 88, QRS 106, QTc 467  Echo 05/28/19 demonstrated   1. Left ventricular ejection fraction, by visual estimation, is 40 to 45%. The left ventricle has moderately decreased function. There is mildly increased left ventricular hypertrophy.  2. Left ventricular diastolic parameters are indeterminate.  3. The left ventricle demonstrates global  hypokinesis.  4. Global right ventricle has normal systolic function.The right ventricular size is normal. No increase in right ventricular wall thickness.  5. Left atrial size was normal.  6. Right atrial size was normal.  7. The mitral valve is normal in structure. Trivial mitral valve regurgitation. No evidence of mitral stenosis.  8. The tricuspid valve is normal in structure. Tricuspid valve regurgitation is trivial.  9. Aortic valve regurgitation is moderate. 10. The aortic valve is normal in structure. Aortic valve regurgitation is moderate. No evidence of aortic valve sclerosis or stenosis. 11. Pulmonic regurgitation is moderate. 12. The pulmonic valve was normal in structure. Pulmonic valve regurgitation is moderate. 13. The inferior vena cava is normal in size with greater than 50% respiratory variability, suggesting right atrial pressure of 3 mmHg.  Epic records are reviewed at length today  Assessment and Plan:  1. Persistent atrial fibrillation/atrial flutter S/p afib and atrial flutter ablation with Dr Curt Bears 81/1/57 complicated by cardiogenic shock.  Recall amiodarone stopped 2/2 tremors and dofetilide stopped 2/2 renal insufficiency.  We had a long discussion about ablation. We discussed that for his particular situation, acting early with ablation would be preferable to AAD and rate control. Will request sooner follow up with Dr Curt Bears to discuss. He does not want to make any decisions today. Could consider dofetilide although with his current CrCl he would only be able to take 125 mcg BID. Continue Toprol to 25 mg BID Continue Eliquis 5 mg BID  This patients CHA2DS2-VASc Score  and unadjusted Ischemic Stroke Rate (% per year) is equal to 3.2 % stroke rate/year from a score of 3  Above score calculated as 1 point each if present [CHF, HTN, DM, Vascular=MI/PAD/Aortic Plaque, Age if 65-74, or Male] Above score calculated as 2 points each if present [Age > 75, or  Stroke/TIA/TE]  2. Obstructive sleep apnea Patient reports compliance with CPAP therapy.  3. Chronic systolic CHF EF improved to 35-40% No signs or symptoms of fluid overload today.  4. HTN Stable, no changes today.   Follow up with Dr Curt Bears to discuss ablation.    [facility] [address] [place], [place] [address] [phone] 01/17/2020 9:08 PM

## 2020-01-17 ENCOUNTER — Ambulatory Visit (HOSPITAL_COMMUNITY)
Admission: RE | Admit: 2020-01-17 | Discharge: 2020-01-17 | Disposition: A | Discharge status: Home / Self Care | Payer: Medicare HMO | Source: Ambulatory Visit | Attending: Physician Assistant | Admitting: Physician Assistant

## 2020-01-17 ENCOUNTER — Other Ambulatory Visit: Payer: Self-pay

## 2020-01-17 ENCOUNTER — Encounter (HOSPITAL_COMMUNITY): Payer: Self-pay | Admitting: Physician Assistant

## 2020-01-17 VITALS — BP 108/76 | HR 88 | Ht 71.0 in | Wt 189.6 lb

## 2020-01-17 DIAGNOSIS — Z882 Allergy status to sulfonamides status: Secondary | ICD-10-CM | POA: Insufficient documentation

## 2020-01-17 DIAGNOSIS — Z87891 Personal history of nicotine dependence: Secondary | ICD-10-CM | POA: Diagnosis not present

## 2020-01-17 DIAGNOSIS — N189 Chronic kidney disease, unspecified: Secondary | ICD-10-CM | POA: Insufficient documentation

## 2020-01-17 DIAGNOSIS — N400 Enlarged prostate without lower urinary tract symptoms: Secondary | ICD-10-CM | POA: Insufficient documentation

## 2020-01-17 DIAGNOSIS — Z888 Allergy status to other drugs, medicaments and biological substances status: Secondary | ICD-10-CM | POA: Diagnosis not present

## 2020-01-17 DIAGNOSIS — I130 Hypertensive heart and chronic kidney disease with heart failure and stage 1 through stage 4 chronic kidney disease, or unspecified chronic kidney disease: Secondary | ICD-10-CM | POA: Insufficient documentation

## 2020-01-17 DIAGNOSIS — E785 Hyperlipidemia, unspecified: Secondary | ICD-10-CM | POA: Insufficient documentation

## 2020-01-17 DIAGNOSIS — E0922 Drug or chemical induced diabetes mellitus with diabetic chronic kidney disease: Secondary | ICD-10-CM | POA: Insufficient documentation

## 2020-01-17 DIAGNOSIS — Z7901 Long term (current) use of anticoagulants: Secondary | ICD-10-CM | POA: Insufficient documentation

## 2020-01-17 DIAGNOSIS — G4733 Obstructive sleep apnea (adult) (pediatric): Secondary | ICD-10-CM | POA: Diagnosis not present

## 2020-01-17 DIAGNOSIS — R079 Chest pain, unspecified: Secondary | ICD-10-CM | POA: Insufficient documentation

## 2020-01-17 DIAGNOSIS — D6869 Other thrombophilia: Secondary | ICD-10-CM

## 2020-01-17 DIAGNOSIS — E669 Obesity, unspecified: Secondary | ICD-10-CM | POA: Diagnosis not present

## 2020-01-17 DIAGNOSIS — I4819 Other persistent atrial fibrillation: Secondary | ICD-10-CM | POA: Insufficient documentation

## 2020-01-17 DIAGNOSIS — Z833 Family history of diabetes mellitus: Secondary | ICD-10-CM | POA: Insufficient documentation

## 2020-01-17 DIAGNOSIS — Z79899 Other long term (current) drug therapy: Secondary | ICD-10-CM | POA: Diagnosis not present

## 2020-01-17 DIAGNOSIS — Z8249 Family history of ischemic heart disease and other diseases of the circulatory system: Secondary | ICD-10-CM | POA: Insufficient documentation

## 2020-01-17 DIAGNOSIS — I350 Nonrheumatic aortic (valve) stenosis: Secondary | ICD-10-CM | POA: Diagnosis not present

## 2020-01-17 DIAGNOSIS — I5022 Chronic systolic (congestive) heart failure: Secondary | ICD-10-CM | POA: Insufficient documentation

## 2020-01-18 NOTE — Progress Notes (Signed)
Thanks, Ricky. I would also favor early redo ablation.

## 2020-01-20 ENCOUNTER — Other Ambulatory Visit: Payer: Self-pay | Admitting: Family Medicine

## 2020-01-20 DIAGNOSIS — I10 Essential (primary) hypertension: Secondary | ICD-10-CM

## 2020-01-20 DIAGNOSIS — G4700 Insomnia, unspecified: Secondary | ICD-10-CM

## 2020-01-20 DIAGNOSIS — E669 Obesity, unspecified: Secondary | ICD-10-CM

## 2020-01-20 DIAGNOSIS — E875 Hyperkalemia: Secondary | ICD-10-CM

## 2020-01-20 DIAGNOSIS — D649 Anemia, unspecified: Secondary | ICD-10-CM

## 2020-01-20 DIAGNOSIS — E785 Hyperlipidemia, unspecified: Secondary | ICD-10-CM

## 2020-01-20 DIAGNOSIS — Z7901 Long term (current) use of anticoagulants: Secondary | ICD-10-CM

## 2020-01-20 DIAGNOSIS — H409 Unspecified glaucoma: Secondary | ICD-10-CM

## 2020-01-20 DIAGNOSIS — M25562 Pain in left knee: Secondary | ICD-10-CM

## 2020-01-20 DIAGNOSIS — M25561 Pain in right knee: Secondary | ICD-10-CM

## 2020-01-20 DIAGNOSIS — F411 Generalized anxiety disorder: Secondary | ICD-10-CM

## 2020-01-20 DIAGNOSIS — E1169 Type 2 diabetes mellitus with other specified complication: Secondary | ICD-10-CM

## 2020-01-22 ENCOUNTER — Other Ambulatory Visit: Payer: Self-pay

## 2020-01-22 ENCOUNTER — Encounter: Payer: Self-pay | Admitting: Cardiology

## 2020-01-22 ENCOUNTER — Ambulatory Visit (INDEPENDENT_AMBULATORY_CARE_PROVIDER_SITE_OTHER): Payer: Medicare HMO | Admitting: Cardiology

## 2020-01-22 VITALS — BP 114/74 | HR 100 | Ht 71.0 in | Wt 187.2 lb

## 2020-01-22 DIAGNOSIS — I4819 Other persistent atrial fibrillation: Secondary | ICD-10-CM | POA: Diagnosis not present

## 2020-01-22 DIAGNOSIS — I4891 Unspecified atrial fibrillation: Secondary | ICD-10-CM

## 2020-01-22 DIAGNOSIS — Z01812 Encounter for preprocedural laboratory examination: Secondary | ICD-10-CM | POA: Diagnosis not present

## 2020-01-22 MED ORDER — METOPROLOL SUCCINATE ER 50 MG PO TB24: 50.0000 mg | ORAL_TABLET | Freq: Every day | ORAL | 1 refills | Status: DC

## 2020-01-22 NOTE — Patient Instructions (Addendum)
Medication Instructions:  Your physician has recommended you make the following change in your medication:  1. INCREASE Toprol to 50 mg twice a day  *If you need a refill on your cardiac medications before your next appointment, please call your pharmacy*   Lab Work: Pre procedure labs 02/25/20 If you have labs (blood work) drawn today and your tests are completely normal, you will receive your results only by:  [place] (if you have MyChart) OR  A paper copy in the mail If you have any lab test that is abnormal or we need to change your treatment, we will call you to review the results.   Testing/Procedures: Your physician has requested that you have cardiac CT within 7 days PRIOR to your ablation. Cardiac computed tomography (CT) is a painless test that uses an x-ray machine to take clear, detailed pictures of your heart.  Please follow instructions below, located under "other instructions".   Your physician has recommended that you have an ablation. Catheter ablation is a medical procedure used to treat some cardiac arrhythmias (irregular heartbeats). During catheter ablation, a long, thin, flexible tube is put into a blood vessel in your groin (upper thigh), or neck. This tube is called an ablation catheter. It is then guided to your heart through the blood vessel. Radio frequency waves destroy small areas of heart tissue where abnormal heartbeats may cause an arrhythmia to start. Please follow instructions below, located under "other instructions".   Follow-Up: At [facility], you and your health needs are our priority. As part of our continuing mission to provide you with exceptional heart care, we have created designated Provider Care Teams. These Care Teams include your primary Cardiologist (physician) and Advanced Practice Providers (APPs -  Physician Assistants and Nurse Practitioners) who all work together to provide you with the care you need, when you need  it.  We recommend signing up for the patient portal called "MyChart".  Sign up information is provided on this After Visit Summary.  MyChart is used to connect with patients for Virtual Visits (Telemedicine).  Patients are able to view lab/test results, encounter notes, upcoming appointments, etc.  Non-urgent messages can be sent to your provider as well.   To learn more about what you can do with MyChart, go to [email or web address].    Your next appointment:   4 week(s) after your ablation  The format for your next appointment:   In Person  Provider:   AFib clinic   Thank you for choosing [facility]!!   Trinidad Curet, RN [phone]    Other Instructions:  Your cardiac CT will be scheduled at:  [facility] [address] [place], [place] [address] [phone]  Please arrive at the [facility] main entrance of [facility] on ______________ at ______________, please arrive 30 minutes prior to test start time. Proceed to the [facility] Radiology Department (first floor) to check-in and test prep.   Please follow these instructions carefully (unless otherwise directed):  Hold all erectile dysfunction medications at least 3 days (72 hrs) prior to test.  On the Night Before the Test:  Be sure to Drink plenty of water.  Do not consume any caffeinated/decaffeinated beverages or chocolate 12 hours prior to your test.  Do not take any antihistamines 12 hours prior to your test.  On the Day of the Test:  Drink plenty of water. Do not drink any water within one hour of the test.  Do not eat any  food 4 hours prior to the test.  You may take your regular medications prior to the test.   Take your Toprol the morning of this test.  HOLD Furosemide/Hydrochlorothiazide morning of the test.       After the Test:  Drink plenty of water.  After receiving IV contrast, you may experience a mild flushed feeling. This is  normal.  On occasion, you may experience a mild rash up to 24 hours after the test. This is not dangerous. If this occurs, you can take Benadryl 25 mg and increase your fluid intake.  If you experience trouble breathing, this can be serious. If it is severe call 911 IMMEDIATELY. If it is mild, please call our office.  If you take any of these medications: Glipizide/Metformin, Avandament, Glucavance, please do not take 48 hours after completing test unless otherwise instructed.   Once we have confirmed authorization from your insurance company, we will call you to set up a date and time for your test. Based on how quickly your insurance processes prior authorizations requests, please allow up to 4 weeks to be contacted for scheduling your Cardiac CT appointment. Be advised that routine Cardiac CT appointments could be scheduled as many as 8 weeks after your provider has ordered it.  For non-scheduling related questions, please contact the cardiac imaging nurse navigator should you have any questions/concerns: Marchia Bond, Cardiac Imaging Nurse Navigator Burley Saver, Interim Cardiac Imaging Nurse [place] [facility] Direct Office Dial: [phone]   For scheduling needs, including cancellations and rescheduling, please call Vivien Rota at [phone], option [phone].     Electrophysiology/Ablation Procedure Instructions  You are scheduled for a(n)  ablationon 03/10/2020/2021 with Dr.Will Camnitz.  1. Pre procedure testing- A. LAB WORK --- On  02/25/2020 for your pre procedure blood work. You can stop by anytime between 7:30 am - 4:30 pm.  You do NOT need to be fasting for this blood work.  B. COVID TEST-- On 03/08/2020 @ 10:00 am- This is a Drive Up Visit at [address] [place], [place] [address] Someone will direct you to the appropriate testing line. Stay in your car and the nurse team will come to your car to test you.   After you are tested please go home and self quarantine until the day of your procedure.    2. On the day of your procedure 03/10/2020 you will go to [facility] [phone]) at 5:30 am. Dennis Bast will go to the main entrance A [employer]) and enter where the [employer] are. Your driver will drop you off and you will head down the hallway to ADMITTING. You may have one support person come in to the hospital with you.  They will be asked to wait in the waiting room.  3. Do not eat or drink after midnight prior to your procedure.  4. Do NOT take any medications the morning of your procedure.  5. Plan for an overnight stay. If you use your phone frequently bring your phone charger.  6. You will follow up with the AFIB clinic 4 weeksafter your procedure. You will follow up with Dr. Curt Bears  3 monthsafter your procedure. These appointments will be made for you.  * If you have ANY questions please call the office [phone] and ask for Arena Lindahl RNor send me a MyChart message  * Occasionally, EP Studies and ablations can become lengthy. Please make your family aware of this before your procedure  starts. Average time ranges from 2-8 hours for EP studies/ablations. Your physician will [place] family after the procedure with the results.

## 2020-01-22 NOTE — Progress Notes (Signed)
Electrophysiology Office Note   Date:  01/22/2020   ID:  [name] February 27, 1951, MRN [id number]  PCP:  [name], MD  Cardiologist:  [name] Primary Electrophysiologist:  Marlyn Tondreau Meredith [name], MD    No chief complaint on file.    History of Present Illness: [name] is a 69 y.o. male who is being seen today for the evaluation of atrial fibrillation at the request of [name]. Presenting today for electrophysiology evaluation.  He has a history of atrial fibrillation, atrial flutter, aortic insufficiency, hypertension, OSA, hyperlipidemia, type 2 diabetes, and diastolic heart failure.   He had cardioversion 02/27/2019.  Based on his symptoms, he was in sinus rhythm for 24 to 48 hours.  He has developed NYHA class II-III exertional dyspnea and fatigue.  He does not have chest pain.  He does report compliance with CPAP.  ECG previously showed atrial flutter with a heart rate of 113.   He had AF ablation 05/16/2019.  He was in acute systolic heart failure at the time of ablation and required prolonged hospitalization postop.  He had done well, but did go back into atrial fibrillation.  He was seen in atrial fibrillation clinic 01/17/2020 and was noted to be in atrial fibrillation for approximately 2 weeks.  Today, denies symptoms of palpitations, chest pain, shortness of breath, orthopnea, PND, lower extremity edema, claudication, dizziness, presyncope, syncope, bleeding, or neurologic sequela. The patient is tolerating medications without difficulties.  He is doing well.  He has no chest pain or shortness of breath.  He is able to do all of his daily activities.  In review of his Linq monitor, he has been in atrial fibrillation/flutter for the past few weeks.  He is unaware of his arrhythmia.  Past Medical History:  Diagnosis Date  . AORTIC STENOSIS, MODERATE 12/05/2009  . Atrial fibrillation ([place])   . Atrial fibrillation with RVR ([place]) 07/05/2017  . Atrial flutter  ([place]) 12/05/2009  . Atypical chest pain 11/08/2011  . BENIGN PROSTATIC HYPERTROPHY, HX OF 12/05/2009  . CHEST PAIN-UNSPECIFIED 11/11/2009  . CHF 12/05/2009  . Chronic renal insufficiency 12/16/2016  . Debility 04/18/2012  . Diarrhea 12/16/2016  . DYSPNEA 12/19/2009  . Edema 04/18/2012  . ERECTILE DYSFUNCTION 01/25/2007  . FATIGUE, CHRONIC 11/11/2009  . Gout 07/10/2012  . Heart murmur   . History of kidney stones    "they passed" (07/05/2017)  . Hyperkalemia 04/18/2012  . Hyperlipidemia   . Hypertension   . HYPERTENSION 01/25/2007  . Hypoxia 05/05/2011  . Insomnia 05/13/2016  . INSOMNIA, HX OF 01/25/2007  . Knee pain, bilateral 12/28/2010  . Knee pain, right 12/28/2010  . NEUROMA 01/05/2010  . OBESITY NOS 01/25/2007  . OSA on CPAP 04/06/2010  . PERIPHERAL NEUROPATHY 01/05/2010  . PULMONARY FUNCTION TESTS, ABNORMAL 02/02/2010  . Superficial thrombophlebitis of left leg 09/07/2011  . TESTICULAR HYPOFUNCTION 01/05/2010  . 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 Toe pain, right 07/10/2012  . Type II diabetes mellitus ([place]) 12/05/2009  . UNSPECIFIED ANEMIA 01/05/2010   Past Surgical History:  Procedure Laterality Date  . A-FLUTTER ABLATION N/A 05/16/2019   Procedure: A-FLUTTER ABLATION;  Surgeon: Constance Haw, MD;  Location: [facility];  Service: Cardiovascular;  Laterality: N/A;  . ATRIAL FIBRILLATION ABLATION N/A 05/16/2019   Procedure: ATRIAL FIBRILLATION ABLATION;  Surgeon: Constance Haw, MD;  Location: [facility];  Service: Cardiovascular;  Laterality: N/A;  . CARDIAC CATHETERIZATION  10/16/2009   nonischemic cardiomyopathy  .  CARDIOVERSION N/A 03/25/2015   Procedure: CARDIOVERSION;  Surgeon: Pixie Casino, MD;  Location: [facility];  Service: Cardiovascular;  Laterality: N/A;  . CARDIOVERSION N/A 06/17/2017   Procedure: CARDIOVERSION;  Surgeon: Sanda Klein, MD;  Location: [facility];  Service: Cardiovascular;  Laterality: N/A;  . CARDIOVERSION N/A 07/08/2017   Procedure:  CARDIOVERSION;  Surgeon: Fay Records, MD;  Location: [facility];  Service: Cardiovascular;  Laterality: N/A;  . CARDIOVERSION N/A 02/27/2019   Procedure: CARDIOVERSION;  Surgeon: Sanda Klein, MD;  Location: [facility] ENDOSCOPY;  Service: Cardiovascular;  Laterality: N/A;  . CARDIOVERSION N/A 08/16/2019   Procedure: CARDIOVERSION;  Surgeon: Sanda Klein, MD;  Location: [facility] ENDOSCOPY;  Service: Cardiovascular;  Laterality: N/A;  . METATARSAL OSTEOTOMY Bilateral ~ 1980   removed part of 5th metatarsal to corect curvature of toes   . RIGHT HEART CATH N/A 05/16/2019   Procedure: RIGHT HEART CATH;  Surgeon: Jolaine Artist, MD;  Location: [facility];  Service: Cardiovascular;  Laterality: N/A;  . TEE WITHOUT CARDIOVERSION  05/16/2019   Procedure: Transesophageal Echocardiogram (Tee);  Surgeon: Constance Haw, MD;  Location: [facility];  Service: Cardiovascular;;     Current Outpatient Medications  Medication Sig Dispense Refill  . albuterol (VENTOLIN HFA) 108 (90 Base) MCG/ACT inhaler Inhale 2 puffs into the lungs every 6 (six) hours as needed for wheezing or shortness of breath. 1 Inhaler 6  . allopurinol (ZYLOPRIM) 300 MG tablet Take 1 tablet by mouth once daily 90 tablet 0  . Blood Glucose Monitoring Suppl (ONETOUCH VERIO) w/Device KIT Use to check blood sugar TID as directed.  Dx Code: E11.9 1 kit 0  . calcium carbonate (OS-CAL) 1250 (500 Ca) MG chewable tablet Chew by mouth.    . cetirizine (ZYRTEC) 10 MG tablet Take 1 tablet (10 mg total) by mouth daily. 30 tablet 11  . Clobetasol Prop Emollient Base (CLOBETASOL PROPIONATE E) 0.05 % emollient cream Apply 1 application topically 2 (two) times daily as needed (insect bite). 60 g 1  . colchicine (COLCRYS) 0.6 MG tablet TAKE ONE TABLET BY MOUTH DAILY AS NEEDED FOR GOUT FLARE UPS. 30 tablet 0  . dapagliflozin propanediol (FARXIGA) 10 MG TABS tablet Take 1 tablet (10 mg total) by mouth daily before breakfast. 30 tablet 6  .  ELIQUIS 5 MG TABS tablet Take 1 tablet by mouth twice daily 60 tablet 10  . fish oil-omega-3 fatty acids 1000 MG capsule Take 1 g by mouth daily.     . furosemide (LASIX) 80 MG tablet Take 1 tablet (80 mg total) by mouth daily. 30 tablet 6  . glucose blood test strip (Verio) Use as instructed Check blood sugars TID DxE11.9 100 each 12  . glyBURIDE (DIABETA) 5 MG tablet Take 5 mg by mouth as needed.     . hydrALAZINE (APRESOLINE) 25 MG tablet Take 1 tablet (25 mg total) by mouth 3 (three) times daily. 90 tablet 2  . HYDROcodone-acetaminophen (NORCO) 10-325 MG tablet Take 1 tablet by mouth every 8 (eight) hours as needed for moderate pain or severe pain. 90 tablet 0  . hydrocortisone 2.5 % ointment Apply topically 2 (two) times daily as needed. 30 g 1  . isosorbide mononitrate (IMDUR) 30 MG 24 hr tablet Take 1 tablet (30 mg total) by mouth daily. 30 tablet 11  . LORazepam (ATIVAN) 1 MG tablet Take 1 tablet (1 mg total) by mouth every 8 (eight) hours as needed for anxiety. 30 tablet 5  . magnesium  oxide (MAG-OX) 400 MG tablet Take 1 tablet (400 mg total) by mouth 2 (two) times daily. 30 tablet 11  . Multiple Vitamin (MULTIVITAMIN WITH MINERALS) TABS tablet Take 1 tablet by mouth daily.     . naproxen (NAPROSYN) 375 MG tablet Take 375 mg by mouth 2 (two) times daily as needed (knee pain.).     [name] potassium chloride SA (KLOR-CON) 20 MEQ tablet Take 1 tablet (20 mEq total) by mouth as directed. 1 tablet daily 20 mEq by mouth as directed per alleviate research protocol. 180 tablet 1  . pravastatin (PRAVACHOL) 40 MG tablet Take 1 tablet by mouth once daily 90 tablet 3  . tamsulosin (FLOMAX) 0.4 MG CAPS capsule Take 1 capsule by mouth once daily 30 capsule 0  . temazepam (RESTORIL) 30 MG capsule Take 1 capsule (30 mg total) by mouth at bedtime. 30 capsule 5  . metoprolol succinate (TOPROL-XL) 50 MG 24 hr tablet Take 1 tablet (50 mg total) by mouth daily. Take with or immediately following a meal. 90 tablet 1    No current facility-administered medications for this visit.    Allergies:   Sulfonamide derivatives and Zolpidem   Social History:  The patient  reports that he quit smoking about 39 years ago. He has a 10.00 pack-year smoking history. He has never used smokeless tobacco. He reports that he does not drink alcohol and does not use drugs.   Family History:  The patient's family history includes Aneurysm in his sister; Cancer in his father; Clotting disorder in his mother; Diabetes in his mother and sister; Heart attack in his mother; Heart disease in his mother; Hyperlipidemia in his mother; Hypertension in his mother and sister; Leukemia in his maternal grandmother; Lung disease in his father; Other in his sister; Seizures in his sister; Stroke in his mother.    ROS:  Please see the history of present illness.   Otherwise, review of systems is positive for none.   All other systems are reviewed and negative.   PHYSICAL EXAM: VS:  BP 114/74   Pulse 100   Ht _0  (1.803 m)   Wt 187 lb 3.2 oz (84.9 kg)   SpO2 99%   BMI 26.11 kg/m  , BMI Body mass index is 26.11 kg/m. GEN: Well nourished, well developed, in no acute distress  HEENT: normal  Neck: no JVD, carotid bruits, or masses Cardiac: irregular; no murmurs, rubs, or gallops,no edema  Respiratory:  clear to auscultation bilaterally, normal work of breathing GI: soft, nontender, nondistended, + BS MS: no deformity or atrophy  Skin: warm and dry Neuro:  Strength and sensation are intact Psych: euthymic mood, full affect  EKG:  EKG is not ordered today. Personal review of the ekg ordered 01/17/20 shows atrial flutter, rate 88  Recent Labs: 05/29/2019: Magnesium 2.1 06/11/2019: TSH 1.91 07/23/2019: ALT 23 08/02/2019: Pro B Natriuretic peptide (BNP) 446.0 11/12/2019: Hemoglobin 10.6; Platelets 208 11/26/2019: B Natriuretic Peptide 349.6; BUN 34; Creatinine, Ser 2.16; Potassium 4.7; Sodium 139    Lipid Panel     Component Value  Date/Time   CHOL 134 06/11/2019 1106   TRIG 83.0 06/11/2019 1106   HDL 41.50 06/11/2019 1106   CHOLHDL 3 06/11/2019 1106   VLDL 16.6 06/11/2019 1106   LDLCALC 76 06/11/2019 1106   LDLDIRECT 84.0 05/02/2018 1523     Wt Readings from Last 3 Encounters:  01/22/20 187 lb 3.2 oz (84.9 kg)  01/17/20 189 lb 9.6 oz (86 kg)  01/03/20 186  lb 9.6 oz (84.6 kg)      Other studies Reviewed: Additional studies/ records that were reviewed today include: TTE 12/12/2019 Review of the above records today demonstrates:  1. Left ventricular ejection fraction, by estimation, is 35 to 40%. The  left ventricle has moderately decreased function. The left ventricle  demonstrates global hypokinesis. Indeterminate diastolic filling due to  E-A fusion.  2. Right ventricular systolic function is mildly reduced. The right  ventricular size is mildly enlarged. There is moderately elevated  pulmonary artery systolic pressure. The estimated right ventricular  systolic pressure is 16.1 mmHg.  3. Left atrial size was moderately dilated.  4. The mitral valve is grossly normal. Mild mitral valve regurgitation.  No evidence of mitral stenosis.  5. The aortic valve is tricuspid. Aortic valve regurgitation is mild to  moderate. No aortic stenosis is present.  6. Pulmonic valve regurgitation is moderate.  7. Aortic dilatation noted. Aneurysm of the aortic root, measuring 45 mm.  8. The inferior vena cava is normal in size with greater than 50%  respiratory variability, suggesting right atrial pressure of 3 mmHg.   ASSESSMENT AND PLAN:  1.  Persistent atrial fibrillation/flutter: Currently on Eliquis with a CHA2DS2-VASc of 3.  Status post AF ablation 05/16/2019.  During the procedure, he went into acute heart failure and thus the procedure was stopped prior to completion.  He is currently off of amiodarone, but having more frequent episodes of atrial fibrillation.  At this point, he would likely benefit from  further medication management with dofetilide versus repeat ablation.  At this point, I have told him that ablation is likely the most appropriate form of therapy to prevent worsening of his ejection fraction.  Due to that, we [name] plan for ablation.  Risks and benefits have previously been discussed.  The patient understands these risks and is agreed to the procedure.  To further control his heart rate, [name] increase his Toprol-XL to 50 mg twice daily.  2.  Chronic systolic heart failure due to nonischemic cardiomyopathy: Ejection fraction has fortunately improved to 35 to 40%.  This is remained stable over several echoes.  Currently on Toprol-XL, Imdur, hydralazine.  No obvious volume overload.    3.  Hypertension: Currently well controlled  Case discussed with primary cardiology  Current medicines are reviewed at length with the patient today.   The patient does not have concerns regarding his medicines.  The following changes were made today: Increase Toprol-XL  Labs/ tests ordered today include:  Orders Placed This Encounter  Procedures  . CT CARDIAC MORPH/PULM VEIN W/CM&W/O CA SCORE  . CBC  . Basic metabolic panel     Disposition:   FU with [name] 3 months  Signed, [name] Meredith [name], MD  01/22/2020 4:31 PM     [address] [place] [id number] [phone] (office) [phone] (fax)

## 2020-01-23 ENCOUNTER — Telehealth: Payer: Self-pay

## 2020-01-23 DIAGNOSIS — Z006 Encounter for examination for normal comparison and control in clinical research program: Secondary | ICD-10-CM

## 2020-01-23 MED ORDER — METOPROLOL SUCCINATE ER 50 MG PO TB24: 50.0000 mg | ORAL_TABLET | Freq: Two times a day (BID) | ORAL | 3 refills | Status: DC

## 2020-01-23 NOTE — Telephone Encounter (Signed)
Per last office note on 01/22/20 with Dr. Curt Bears, Medication Instructions: Your physician has recommended you make the following change in your medication:  1. INCREASE Toprol to 50 mg twice a day. This was not sent into the pharmacy for the increase. I resent pt's Rx for the increase of metoprolol 50 mg BID, per Dr. Curt Bears. Confirmation received. FYI

## 2020-01-23 NOTE — Research (Signed)
Alleviate HF Study  [facility] Transmission

## 2020-02-04 DIAGNOSIS — Z006 Encounter for examination for normal comparison and control in clinical research program: Secondary | ICD-10-CM

## 2020-02-04 NOTE — Research (Addendum)
Alleviate Research Study  [facility] transmission  Remains in persistent atypical atrial flutter. Rate is well controlled. Planned redo ablation.

## 2020-02-07 ENCOUNTER — Encounter: Payer: Medicare HMO | Admitting: Cardiology

## 2020-02-07 DIAGNOSIS — Z006 Encounter for examination for normal comparison and control in clinical research program: Secondary | ICD-10-CM

## 2020-02-07 MED ORDER — FUROSEMIDE 80 MG PO TABS
80.0000 mg | ORAL_TABLET | Freq: Every day | ORAL | 6 refills | Status: DC
Start: 2020-02-07 — End: 2020-03-28

## 2020-02-07 NOTE — Research (Signed)
Updated Lasix and  Potassium to coordinate with Alleviate HF Research Study.

## 2020-02-13 DIAGNOSIS — G4733 Obstructive sleep apnea (adult) (pediatric): Secondary | ICD-10-CM | POA: Diagnosis not present

## 2020-02-14 ENCOUNTER — Other Ambulatory Visit: Payer: Self-pay | Admitting: Family Medicine

## 2020-02-14 NOTE — Telephone Encounter (Signed)
Last written: 07/23/19 Last ov: 12/24/19 Next ov: 05/22/20 Contract: 07/31/18 UDS: 07/31/18  Will update uds and contract at next visit

## 2020-02-18 ENCOUNTER — Other Ambulatory Visit: Payer: Self-pay | Admitting: Cardiovascular Disease

## 2020-02-25 ENCOUNTER — Other Ambulatory Visit: Payer: Self-pay

## 2020-02-25 ENCOUNTER — Other Ambulatory Visit: Payer: Medicare HMO | Admitting: *Deleted

## 2020-02-25 DIAGNOSIS — I4819 Other persistent atrial fibrillation: Secondary | ICD-10-CM | POA: Diagnosis not present

## 2020-02-25 DIAGNOSIS — Z01812 Encounter for preprocedural laboratory examination: Secondary | ICD-10-CM

## 2020-02-25 DIAGNOSIS — I4891 Unspecified atrial fibrillation: Secondary | ICD-10-CM

## 2020-02-25 LAB — BASIC METABOLIC PANEL
BUN/Creatinine Ratio: 14 (ref 10–24)
BUN: 29 mg/dL — ABNORMAL HIGH (ref 8–27)
CO2: 26 mmol/L (ref 20–29)
Calcium: 9.5 mg/dL (ref 8.6–10.2)
Chloride: 100 mmol/L (ref 96–106)
Creatinine, Ser: 2.04 mg/dL — ABNORMAL HIGH (ref 0.76–1.27)
GFR calc Af Amer: 38 mL/min/{1.73_m2} — ABNORMAL LOW (ref >59)
GFR calc non Af Amer: 33 mL/min/{1.73_m2} — ABNORMAL LOW (ref >59)
Glucose: 127 mg/dL — ABNORMAL HIGH (ref 65–99)
Potassium: 3.9 mmol/L (ref 3.5–5.2)
Sodium: 139 mmol/L (ref 134–144)

## 2020-02-25 LAB — CBC
Hematocrit: 42.3 % (ref 37.5–51.0)
Hemoglobin: 13.3 g/dL (ref 13.0–17.7)
MCH: 25 pg — ABNORMAL LOW (ref 26.6–33.0)
MCHC: 31.4 g/dL — ABNORMAL LOW (ref 31.5–35.7)
MCV: 80 fL (ref 79–97)
Platelets: 183 10*3/uL (ref 150–450)
RBC: 5.31 x10E6/uL (ref 4.14–5.80)
RDW: 15.9 % — ABNORMAL HIGH (ref 11.6–15.4)
WBC: 4.4 10*3/uL (ref 3.4–10.8)

## 2020-02-29 ENCOUNTER — Telehealth (INDEPENDENT_AMBULATORY_CARE_PROVIDER_SITE_OTHER): Payer: Medicare HMO | Admitting: Cardiology

## 2020-02-29 ENCOUNTER — Encounter: Payer: Self-pay | Admitting: Cardiology

## 2020-02-29 VITALS — BP 113/79 | HR 84 | Temp 98.1°F | Wt 184.2 lb

## 2020-02-29 DIAGNOSIS — I4819 Other persistent atrial fibrillation: Secondary | ICD-10-CM | POA: Diagnosis not present

## 2020-02-29 NOTE — Progress Notes (Signed)
Electrophysiology TeleHealth Note   Due to national recommendations of social distancing due to COVID 19, an audio/video telehealth visit is felt to be most appropriate for this patient at this time.  See Epic message for the patient's consent to telehealth for [name].   Date:  02/29/2020   ID:  [name], DOB 02/06/1951, MRN [id number]  Location: patient's home  Provider location: [address], [place]  Evaluation Performed: Follow-up visit  PCP:  [name], MD  Cardiologist:  Sanda Klein, MD  Electrophysiologist:  Dr Curt Bears  Chief Complaint:  AF  History of Present Illness:    [name] is a 70 y.o. male who presents via audio/video conferencing for a telehealth visit today.  Since last being seen in our clinic, the patient reports doing very well.  Today, he denies symptoms of palpitations, chest pain, shortness of breath,  lower extremity edema, dizziness, presyncope, or syncope.  The patient is otherwise without complaint today.  The patient denies symptoms of fevers, chills, cough, or new SOB worrisome for COVID 19.  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  He had an A. fib ablation 05/16/2019.  He was in acute heart failure at the time of the ablation.  He has had more frequent episodes of atrial fibrillation and has plans for AF ablation on 03/10/2020.  Today, denies symptoms of palpitations, chest pain, shortness of breath, orthopnea, PND, lower extremity edema, claudication, dizziness, presyncope, syncope, bleeding, or neurologic sequela. The patient is tolerating medications without difficulties.  He has been feeling well.  He has no chest pain or shortness of breath.  He does feel that he remains in atrial fibrillation and is ready for his ablation.  Past  Medical History:  Diagnosis Date  . AORTIC STENOSIS, MODERATE 12/05/2009  . Atrial fibrillation ([place])   . Atrial fibrillation with RVR ([place]) 07/05/2017  . Atrial flutter ([place]) 12/05/2009  . Atypical chest pain 11/08/2011  . BENIGN PROSTATIC HYPERTROPHY, HX OF 12/05/2009  . CHEST PAIN-UNSPECIFIED 11/11/2009  . CHF 12/05/2009  . Chronic renal insufficiency 12/16/2016  . Debility 04/18/2012  . Diarrhea 12/16/2016  . DYSPNEA 12/19/2009  . Edema 04/18/2012  . ERECTILE DYSFUNCTION 01/25/2007  . FATIGUE, CHRONIC 11/11/2009  . Gout 07/10/2012  . Heart murmur   . History of kidney stones    "they passed" (07/05/2017)  . Hyperkalemia 04/18/2012  . Hyperlipidemia   . Hypertension   . HYPERTENSION 01/25/2007  . Hypoxia 05/05/2011  . Insomnia 05/13/2016  . INSOMNIA, HX OF 01/25/2007  . Knee pain, bilateral 12/28/2010  . Knee pain, right 12/28/2010  . NEUROMA 01/05/2010  . OBESITY NOS 01/25/2007  . OSA on CPAP 04/06/2010  . PERIPHERAL NEUROPATHY 01/05/2010  . PULMONARY FUNCTION TESTS, ABNORMAL 02/02/2010  . Superficial thrombophlebitis of left leg 09/07/2011  . TESTICULAR HYPOFUNCTION 01/05/2010  . 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 Toe pain, right 07/10/2012  . Type II diabetes mellitus ([place]) 12/05/2009  . UNSPECIFIED ANEMIA 01/05/2010    Past Surgical History:  Procedure Laterality Date  . A-FLUTTER ABLATION N/A 05/16/2019   Procedure: A-FLUTTER ABLATION;  Surgeon: Constance Haw, MD;  Location: [place] [facility];  Service: Cardiovascular;  Laterality: N/A;  . ATRIAL FIBRILLATION ABLATION N/A 05/16/2019   Procedure: ATRIAL FIBRILLATION ABLATION;  Surgeon: Constance Haw, MD;  Location: [place] [facility];  Service: Cardiovascular;  Laterality: N/A;  . CARDIAC CATHETERIZATION  10/16/2009   nonischemic cardiomyopathy  . CARDIOVERSION N/A 03/25/2015   Procedure: CARDIOVERSION;  Surgeon: Pixie Casino, MD;  Location: [place];  Service: Cardiovascular;  Laterality: N/A;  . CARDIOVERSION N/A 06/17/2017    Procedure: CARDIOVERSION;  Surgeon: Sanda Klein, MD;  Location: [place] [facility];  Service: Cardiovascular;  Laterality: N/A;  . CARDIOVERSION N/A 07/08/2017   Procedure: CARDIOVERSION;  Surgeon: Fay Records, MD;  Location: [facility];  Service: Cardiovascular;  Laterality: N/A;  . CARDIOVERSION N/A 02/27/2019   Procedure: CARDIOVERSION;  Surgeon: Sanda Klein, MD;  Location: [facility];  Service: Cardiovascular;  Laterality: N/A;  . CARDIOVERSION N/A 08/16/2019   Procedure: CARDIOVERSION;  Surgeon: Sanda Klein, MD;  Location: [facility];  Service: Cardiovascular;  Laterality: N/A;  . METATARSAL OSTEOTOMY Bilateral ~ 1980   removed part of 5th metatarsal to corect curvature of toes   . RIGHT HEART CATH N/A 05/16/2019   Procedure: RIGHT HEART CATH;  Surgeon: Jolaine Artist, MD;  Location: [place] [facility];  Service: Cardiovascular;  Laterality: N/A;  . TEE WITHOUT CARDIOVERSION  05/16/2019   Procedure: Transesophageal Echocardiogram (Tee);  Surgeon: Constance Haw, MD;  Location: [place] [facility];  Service: Cardiovascular;;    Current Outpatient Medications  Medication Sig Dispense Refill  . albuterol (VENTOLIN HFA) 108 (90 Base) MCG/ACT inhaler Inhale 2 puffs into the lungs every 6 (six) hours as needed for wheezing or shortness of breath. 1 Inhaler 6  . allopurinol (ZYLOPRIM) 300 MG tablet Take 1 tablet by mouth once daily (Patient taking differently: Take 300 mg by mouth daily. ) 90 tablet 0  . Blood Glucose Monitoring Suppl (ONETOUCH VERIO) w/Device KIT Use to check blood sugar TID as directed.  Dx Code: E11.9 1 kit 0  . calcium carbonate (TUMS - DOSED IN MG ELEMENTAL CALCIUM) 500 MG chewable tablet Chew 500 mg by mouth daily as needed for indigestion or heartburn.    . cetirizine (ZYRTEC) 10 MG tablet Take 1 tablet (10 mg total) by mouth daily. 30 tablet 11  . Clobetasol Prop Emollient Base (CLOBETASOL PROPIONATE E) 0.05 % emollient cream Apply 1 application  topically 2 (two) times daily as needed (insect bite). 60 g 1  . colchicine (COLCRYS) 0.6 MG tablet TAKE ONE TABLET BY MOUTH DAILY AS NEEDED FOR GOUT FLARE UPS. (Patient taking differently: Take 0.6 mg by mouth daily as needed (gout). ) 30 tablet 0  . dapagliflozin propanediol (FARXIGA) 10 MG TABS tablet Take 1 tablet (10 mg total) by mouth daily before breakfast. 30 tablet 6  . ELIQUIS 5 MG TABS tablet Take 1 tablet by mouth twice daily (Patient taking differently: Take 5 mg by mouth 2 (two) times daily. ) 180 tablet 1  . fish oil-omega-3 fatty acids 1000 MG capsule Take 1 g by mouth daily.     . furosemide (LASIX) 80 MG tablet Take 1 tablet (80 mg total) by mouth daily. As needed patient may take an additional 80 MG tablet as direct per [employer] study PRN plan. (Patient taking differently: Take 80 mg by mouth See admin instructions. Take 80 mg daily, may take a second 80 mg dose as needed for swelling) 30 tablet 6  . glucose blood test strip (Verio) Use as instructed Check blood sugars TID DxE11.9 100 each 12  . glyBURIDE (DIABETA) 5 MG tablet Take 2.5 mg by mouth daily as needed (when eating a big meal).     [name]  hydrALAZINE (APRESOLINE) 25 MG tablet Take 1 tablet (25 mg total) by mouth 3 (three) times daily. 90 tablet 2  . HYDROcodone-acetaminophen (NORCO) 10-325 MG tablet Take 1 tablet by mouth every 8 (eight) hours as needed for moderate pain or severe pain. 90 tablet 0  . hydrocortisone 2.5 % ointment Apply topically 2 (two) times daily as needed. (Patient taking differently: Apply 1 application topically 2 (two) times daily as needed (irritation). ) 30 g 1  . isosorbide mononitrate (IMDUR) 30 MG 24 hr tablet Take 1 tablet (30 mg total) by mouth daily. 30 tablet 11  . LORazepam (ATIVAN) 1 MG tablet Take 1 tablet (1 mg total) by mouth every 8 (eight) hours as needed for anxiety. 30 tablet 5  . magnesium oxide (MAG-OX) 400 MG tablet Take 1 tablet (400 mg total) by mouth 2 (two) times daily.  30 tablet 11  . metoprolol succinate (TOPROL-XL) 50 MG 24 hr tablet Take 1 tablet (50 mg total) by mouth 2 (two) times daily. Take with or immediately following a meal. 180 tablet 3  . Multiple Vitamin (MULTIVITAMIN WITH MINERALS) TABS tablet Take 1 tablet by mouth daily.     . naproxen (NAPROSYN) 375 MG tablet Take 375 mg by mouth 2 (two) times daily as needed (knee pain.).     [name] Polyethyl Glycol-Propyl Glycol (SYSTANE OP) Place 1 drop into both eyes daily as needed (dry eyes).    . potassium chloride SA (KLOR-CON) 20 MEQ tablet Take 1 tablet (20 mEq total) by mouth as directed. 1 tablet daily 20 mEq by mouth as directed per alleviate research protocol. (Patient taking differently: Take 20 mEq by mouth daily. ) 180 tablet 1  . pravastatin (PRAVACHOL) 40 MG tablet Take 1 tablet by mouth once daily (Patient taking differently: Take 40 mg by mouth daily. ) 90 tablet 3  . tamsulosin (FLOMAX) 0.4 MG CAPS capsule Take 1 capsule by mouth once daily (Patient taking differently: Take 0.4 mg by mouth daily. ) 30 capsule 0  . temazepam (RESTORIL) 30 MG capsule Take 1 capsule by mouth at bedtime (Patient taking differently: Take 30 mg by mouth at bedtime. ) 30 capsule 0   No current facility-administered medications for this visit.    Allergies:   Sulfonamide derivatives and Zolpidem   Social History:  The patient  reports that he quit smoking about 39 years ago. He has a 10.00 pack-year smoking history. He has never used smokeless tobacco. He reports that he does not drink alcohol and does not use drugs.   Family History:  The patient's  family history includes Aneurysm in his sister; Cancer in his father; Clotting disorder in his mother; Diabetes in his mother and sister; Heart attack in his mother; Heart disease in his mother; Hyperlipidemia in his mother; Hypertension in his mother and sister; Leukemia in his maternal grandmother; Lung disease in his father; Other in his sister; Seizures in his sister;  Stroke in his mother.   ROS:  Please see the history of present illness.   All other systems are personally reviewed and negative.    Exam:    Vital Signs:  BP 113/79   Pulse 84   Temp 98.1 F (36.7 C)   Wt 184 lb 3.2 oz (83.6 kg)   SpO2 98%   BMI 25.69 kg/m   No acute distress, no shortness of breath.  Labs/Other Tests and Data Reviewed:    Recent Labs: 05/29/2019: Magnesium 2.1 06/11/2019: TSH 1.91 07/23/2019: ALT 23 08/02/2019: Pro  B Natriuretic peptide (BNP) 446.0 11/26/2019: B Natriuretic Peptide 349.6 02/25/2020: BUN 29; Creatinine, Ser 2.04; Hemoglobin 13.3; Platelets 183; Potassium 3.9; Sodium 139   Wt Readings from Last 3 Encounters:  02/29/20 184 lb 3.2 oz (83.6 kg)  01/22/20 187 lb 3.2 oz (84.9 kg)  01/17/20 189 lb 9.6 oz (86 kg)     Other studies personally reviewed: Additional studies/ records that were reviewed today include: ECG 01/17/20 personally reviewed  Review of the above records today demonstrates:  Atrial fibrillation   ASSESSMENT & PLAN:    1.  Persistent atrial fibrillation/flutter: Currently on Eliquis with a CHA2DS2-VASc of 3.  Status post ablation 05/16/2019.  Unfortunately he did go into acute heart failure during of the procedure and thus the procedure was not completed.  We [name] plan for repeat ablation as he has had further episodes of atrial fibrillation and has systolic heart failure when he is in A. fib.  Risks and benefits were discussed which include bleeding, tamponade, heart block, stroke, damage to chest organs.  He understands these risks and has agreed to the procedure.  2.  Chronic systolic heart failure due to nonischemic cardiomyopathy: Ejection fraction is fortunately improved to 35 to 40%.  He is remained stable over several echoes.  Continue Toprol-XL, Imdur, hydralazine.  3.  Hypertension: well controlled   COVID 19 screen The patient denies symptoms of COVID 19 at this time.  The importance of social distancing was discussed  today.  Follow-up:  3 months  Current medicines are reviewed at length with the patient today.   The patient does not have concerns regarding his medicines.  The following changes were made today:  none  Labs/ tests ordered today include:  No orders of the defined types were placed in this encounter.    Patient Risk:  after full review of this patients clinical status, I feel that they are at moderate risk at this time.  Today, I have spent 12 minutes with the patient with telehealth technology discussing AF .    Signed, Aalaysia Liggins Meredith [name], MD  02/29/2020 8:13 AM     [facility] [address] [place] [address] [phone] (office) [phone] (fax)

## 2020-02-29 NOTE — H&P (View-Only) (Signed)
Electrophysiology TeleHealth Note   Due to national recommendations of social distancing due to COVID 19, an audio/video telehealth visit is felt to be most appropriate for this patient at this time.  See Epic message for the patient's consent to telehealth for [name].   Date:  02/29/2020   ID:  [name], DOB 02/06/1951, MRN [id number]  Location: patient's home  Provider location: [address], [place]  Evaluation Performed: Follow-up visit  PCP:  [name], MD  Cardiologist:  Sanda Klein, MD  Electrophysiologist:  Dr Curt Bears  Chief Complaint:  AF  History of Present Illness:    [name] is a 70 y.o. male who presents via audio/video conferencing for a telehealth visit today.  Since last being seen in our clinic, the patient reports doing very well.  Today, he denies symptoms of palpitations, chest pain, shortness of breath,  lower extremity edema, dizziness, presyncope, or syncope.  The patient is otherwise without complaint today.  The patient denies symptoms of fevers, chills, cough, or new SOB worrisome for COVID 19.  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  He had an A. fib ablation 05/16/2019.  He was in acute heart failure at the time of the ablation.  He has had more frequent episodes of atrial fibrillation and has plans for AF ablation on 03/10/2020.  Today, denies symptoms of palpitations, chest pain, shortness of breath, orthopnea, PND, lower extremity edema, claudication, dizziness, presyncope, syncope, bleeding, or neurologic sequela. The patient is tolerating medications without difficulties.  He has been feeling well.  He has no chest pain or shortness of breath.  He does feel that he remains in atrial fibrillation and is ready for his ablation.  Past  Medical History:  Diagnosis Date  . AORTIC STENOSIS, MODERATE 12/05/2009  . Atrial fibrillation ([place])   . Atrial fibrillation with RVR ([place]) 07/05/2017  . Atrial flutter ([place]) 12/05/2009  . Atypical chest pain 11/08/2011  . BENIGN PROSTATIC HYPERTROPHY, HX OF 12/05/2009  . CHEST PAIN-UNSPECIFIED 11/11/2009  . CHF 12/05/2009  . Chronic renal insufficiency 12/16/2016  . Debility 04/18/2012  . Diarrhea 12/16/2016  . DYSPNEA 12/19/2009  . Edema 04/18/2012  . ERECTILE DYSFUNCTION 01/25/2007  . FATIGUE, CHRONIC 11/11/2009  . Gout 07/10/2012  . Heart murmur   . History of kidney stones    "they passed" (07/05/2017)  . Hyperkalemia 04/18/2012  . Hyperlipidemia   . Hypertension   . HYPERTENSION 01/25/2007  . Hypoxia 05/05/2011  . Insomnia 05/13/2016  . INSOMNIA, HX OF 01/25/2007  . Knee pain, bilateral 12/28/2010  . Knee pain, right 12/28/2010  . NEUROMA 01/05/2010  . OBESITY NOS 01/25/2007  . OSA on CPAP 04/06/2010  . PERIPHERAL NEUROPATHY 01/05/2010  . PULMONARY FUNCTION TESTS, ABNORMAL 02/02/2010  . Superficial thrombophlebitis of left leg 09/07/2011  . TESTICULAR HYPOFUNCTION 01/05/2010  . 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 Toe pain, right 07/10/2012  . Type II diabetes mellitus ([place]) 12/05/2009  . UNSPECIFIED ANEMIA 01/05/2010    Past Surgical History:  Procedure Laterality Date  . A-FLUTTER ABLATION N/A 05/16/2019   Procedure: A-FLUTTER ABLATION;  Surgeon: Constance Haw, MD;  Location: [place] [facility];  Service: Cardiovascular;  Laterality: N/A;  . ATRIAL FIBRILLATION ABLATION N/A 05/16/2019   Procedure: ATRIAL FIBRILLATION ABLATION;  Surgeon: Constance Haw, MD;  Location: [place] [facility];  Service: Cardiovascular;  Laterality: N/A;  . CARDIAC CATHETERIZATION  10/16/2009   nonischemic cardiomyopathy  . CARDIOVERSION N/A 03/25/2015   Procedure: CARDIOVERSION;  Surgeon: Pixie Casino, MD;  Location: [facility];  Service: Cardiovascular;  Laterality: N/A;  . CARDIOVERSION N/A 06/17/2017    Procedure: CARDIOVERSION;  Surgeon: Sanda Klein, MD;  Location: [place] [facility];  Service: Cardiovascular;  Laterality: N/A;  . CARDIOVERSION N/A 07/08/2017   Procedure: CARDIOVERSION;  Surgeon: Fay Records, MD;  Location: [facility];  Service: Cardiovascular;  Laterality: N/A;  . CARDIOVERSION N/A 02/27/2019   Procedure: CARDIOVERSION;  Surgeon: Sanda Klein, MD;  Location: [facility];  Service: Cardiovascular;  Laterality: N/A;  . CARDIOVERSION N/A 08/16/2019   Procedure: CARDIOVERSION;  Surgeon: Sanda Klein, MD;  Location: [facility];  Service: Cardiovascular;  Laterality: N/A;  . METATARSAL OSTEOTOMY Bilateral ~ 1980   removed part of 5th metatarsal to corect curvature of toes   . RIGHT HEART CATH N/A 05/16/2019   Procedure: RIGHT HEART CATH;  Surgeon: Jolaine Artist, MD;  Location: [place] [facility];  Service: Cardiovascular;  Laterality: N/A;  . TEE WITHOUT CARDIOVERSION  05/16/2019   Procedure: Transesophageal Echocardiogram (Tee);  Surgeon: Constance Haw, MD;  Location: [place] [facility];  Service: Cardiovascular;;    Current Outpatient Medications  Medication Sig Dispense Refill  . albuterol (VENTOLIN HFA) 108 (90 Base) MCG/ACT inhaler Inhale 2 puffs into the lungs every 6 (six) hours as needed for wheezing or shortness of breath. 1 Inhaler 6  . allopurinol (ZYLOPRIM) 300 MG tablet Take 1 tablet by mouth once daily (Patient taking differently: Take 300 mg by mouth daily. ) 90 tablet 0  . Blood Glucose Monitoring Suppl (ONETOUCH VERIO) w/Device KIT Use to check blood sugar TID as directed.  Dx Code: E11.9 1 kit 0  . calcium carbonate (TUMS - DOSED IN MG ELEMENTAL CALCIUM) 500 MG chewable tablet Chew 500 mg by mouth daily as needed for indigestion or heartburn.    . cetirizine (ZYRTEC) 10 MG tablet Take 1 tablet (10 mg total) by mouth daily. 30 tablet 11  . Clobetasol Prop Emollient Base (CLOBETASOL PROPIONATE E) 0.05 % emollient cream Apply 1 application  topically 2 (two) times daily as needed (insect bite). 60 g 1  . colchicine (COLCRYS) 0.6 MG tablet TAKE ONE TABLET BY MOUTH DAILY AS NEEDED FOR GOUT FLARE UPS. (Patient taking differently: Take 0.6 mg by mouth daily as needed (gout). ) 30 tablet 0  . dapagliflozin propanediol (FARXIGA) 10 MG TABS tablet Take 1 tablet (10 mg total) by mouth daily before breakfast. 30 tablet 6  . ELIQUIS 5 MG TABS tablet Take 1 tablet by mouth twice daily (Patient taking differently: Take 5 mg by mouth 2 (two) times daily. ) 180 tablet 1  . fish oil-omega-3 fatty acids 1000 MG capsule Take 1 g by mouth daily.     . furosemide (LASIX) 80 MG tablet Take 1 tablet (80 mg total) by mouth daily. As needed patient may take an additional 80 MG tablet as direct per [employer] study PRN plan. (Patient taking differently: Take 80 mg by mouth See admin instructions. Take 80 mg daily, may take a second 80 mg dose as needed for swelling) 30 tablet 6  . glucose blood test strip (Verio) Use as instructed Check blood sugars TID DxE11.9 100 each 12  . glyBURIDE (DIABETA) 5 MG tablet Take 2.5 mg by mouth daily as needed (when eating a big meal).     [name]  hydrALAZINE (APRESOLINE) 25 MG tablet Take 1 tablet (25 mg total) by mouth 3 (three) times daily. 90 tablet 2  . HYDROcodone-acetaminophen (NORCO) 10-325 MG tablet Take 1 tablet by mouth every 8 (eight) hours as needed for moderate pain or severe pain. 90 tablet 0  . hydrocortisone 2.5 % ointment Apply topically 2 (two) times daily as needed. (Patient taking differently: Apply 1 application topically 2 (two) times daily as needed (irritation). ) 30 g 1  . isosorbide mononitrate (IMDUR) 30 MG 24 hr tablet Take 1 tablet (30 mg total) by mouth daily. 30 tablet 11  . LORazepam (ATIVAN) 1 MG tablet Take 1 tablet (1 mg total) by mouth every 8 (eight) hours as needed for anxiety. 30 tablet 5  . magnesium oxide (MAG-OX) 400 MG tablet Take 1 tablet (400 mg total) by mouth 2 (two) times daily.  30 tablet 11  . metoprolol succinate (TOPROL-XL) 50 MG 24 hr tablet Take 1 tablet (50 mg total) by mouth 2 (two) times daily. Take with or immediately following a meal. 180 tablet 3  . Multiple Vitamin (MULTIVITAMIN WITH MINERALS) TABS tablet Take 1 tablet by mouth daily.     . naproxen (NAPROSYN) 375 MG tablet Take 375 mg by mouth 2 (two) times daily as needed (knee pain.).     [name] Polyethyl Glycol-Propyl Glycol (SYSTANE OP) Place 1 drop into both eyes daily as needed (dry eyes).    . potassium chloride SA (KLOR-CON) 20 MEQ tablet Take 1 tablet (20 mEq total) by mouth as directed. 1 tablet daily 20 mEq by mouth as directed per alleviate research protocol. (Patient taking differently: Take 20 mEq by mouth daily. ) 180 tablet 1  . pravastatin (PRAVACHOL) 40 MG tablet Take 1 tablet by mouth once daily (Patient taking differently: Take 40 mg by mouth daily. ) 90 tablet 3  . tamsulosin (FLOMAX) 0.4 MG CAPS capsule Take 1 capsule by mouth once daily (Patient taking differently: Take 0.4 mg by mouth daily. ) 30 capsule 0  . temazepam (RESTORIL) 30 MG capsule Take 1 capsule by mouth at bedtime (Patient taking differently: Take 30 mg by mouth at bedtime. ) 30 capsule 0   No current facility-administered medications for this visit.    Allergies:   Sulfonamide derivatives and Zolpidem   Social History:  The patient  reports that he quit smoking about 39 years ago. He has a 10.00 pack-year smoking history. He has never used smokeless tobacco. He reports that he does not drink alcohol and does not use drugs.   Family History:  The patient's  family history includes Aneurysm in his sister; Cancer in his father; Clotting disorder in his mother; Diabetes in his mother and sister; Heart attack in his mother; Heart disease in his mother; Hyperlipidemia in his mother; Hypertension in his mother and sister; Leukemia in his maternal grandmother; Lung disease in his father; Other in his sister; Seizures in his sister;  Stroke in his mother.   ROS:  Please see the history of present illness.   All other systems are personally reviewed and negative.    Exam:    Vital Signs:  BP 113/79   Pulse 84   Temp 98.1 F (36.7 C)   Wt 184 lb 3.2 oz (83.6 kg)   SpO2 98%   BMI 25.69 kg/m   No acute distress, no shortness of breath.  Labs/Other Tests and Data Reviewed:    Recent Labs: 05/29/2019: Magnesium 2.1 06/11/2019: TSH 1.91 07/23/2019: ALT 23 08/02/2019: Pro  B Natriuretic peptide (BNP) 446.0 11/26/2019: B Natriuretic Peptide 349.6 02/25/2020: BUN 29; Creatinine, Ser 2.04; Hemoglobin 13.3; Platelets 183; Potassium 3.9; Sodium 139   Wt Readings from Last 3 Encounters:  02/29/20 184 lb 3.2 oz (83.6 kg)  01/22/20 187 lb 3.2 oz (84.9 kg)  01/17/20 189 lb 9.6 oz (86 kg)     Other studies personally reviewed: Additional studies/ records that were reviewed today include: ECG 01/17/20 personally reviewed  Review of the above records today demonstrates:  Atrial fibrillation   ASSESSMENT & PLAN:    1.  Persistent atrial fibrillation/flutter: Currently on Eliquis with a CHA2DS2-VASc of 3.  Status post ablation 05/16/2019.  Unfortunately he did go into acute heart failure during of the procedure and thus the procedure was not completed.  We Jaunice Mirza plan for repeat ablation as he has had further episodes of atrial fibrillation and has systolic heart failure when he is in A. fib.  Risks and benefits were discussed which include bleeding, tamponade, heart block, stroke, damage to chest organs.  He understands these risks and has agreed to the procedure.  2.  Chronic systolic heart failure due to nonischemic cardiomyopathy: Ejection fraction is fortunately improved to 35 to 40%.  He is remained stable over several echoes.  Continue Toprol-XL, Imdur, hydralazine.  3.  Hypertension: well controlled   COVID 19 screen The patient denies symptoms of COVID 19 at this time.  The importance of social distancing was discussed  today.  Follow-up:  3 months  Current medicines are reviewed at length with the patient today.   The patient does not have concerns regarding his medicines.  The following changes were made today:  none  Labs/ tests ordered today include:  No orders of the defined types were placed in this encounter.    Patient Risk:  after full review of this patients clinical status, I feel that they are at moderate risk at this time.  Today, I have spent 12 minutes with the patient with telehealth technology discussing AF .    Signed, Rome Schlauch Meredith [name], MD  02/29/2020 8:13 AM     [facility] [address] [place] [address] [phone] (office) [phone] (fax)

## 2020-02-29 NOTE — H&P (View-Only) (Signed)
Electrophysiology TeleHealth Note   Due to national recommendations of social distancing due to COVID 19, an audio/video telehealth visit is felt to be most appropriate for this [name] at this time.  See Epic message for [name]'s consent to telehealth for [name].   Date:  02/29/2020   ID:  [name], DOB 02/06/1951, MRN [id number]  Location: [name]'s home  Provider location: [address], [place]  Evaluation Performed: Follow-up visit  PCP:  [name], MD  Cardiologist:  Sanda Klein, MD  Electrophysiologist:  Dr Curt Bears  Chief Complaint:  AF  History of Present Illness:    [name] is a 70 y.o. male who presents via audio/video conferencing for a telehealth visit today.  Since last being seen in our clinic, [name] reports doing very well.  Today, he denies symptoms of palpitations, chest pain, shortness of breath,  lower extremity edema, dizziness, presyncope, or syncope.  [name] is otherwise without complaint today.  [name] denies symptoms of fevers, chills, cough, or new SOB worrisome for COVID 19.  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  He had an A. fib ablation 05/16/2019.  He was in acute heart failure at Kevin time of Kevin ablation.  He has had more frequent episodes of atrial fibrillation and has plans for AF ablation on 03/10/2020.  Today, denies symptoms of palpitations, chest pain, shortness of breath, orthopnea, PND, lower extremity edema, claudication, dizziness, presyncope, syncope, bleeding, or neurologic sequela. [name] is tolerating medications without difficulties.  He has been feeling well.  He has no chest pain or shortness of breath.  He does feel that he remains in atrial fibrillation and is ready for his ablation.  Past  Medical History:  Diagnosis Date  . AORTIC STENOSIS, MODERATE 12/05/2009  . Atrial fibrillation ([place])   . Atrial fibrillation with RVR ([place]) 07/05/2017  . Atrial flutter ([place]) 12/05/2009  . Atypical chest pain 11/08/2011  . BENIGN PROSTATIC HYPERTROPHY, HX OF 12/05/2009  . CHEST PAIN-UNSPECIFIED 11/11/2009  . CHF 12/05/2009  . Chronic renal insufficiency 12/16/2016  . Debility 04/18/2012  . Diarrhea 12/16/2016  . DYSPNEA 12/19/2009  . Edema 04/18/2012  . ERECTILE DYSFUNCTION 01/25/2007  . FATIGUE, CHRONIC 11/11/2009  . Gout 07/10/2012  . Heart murmur   . History of kidney stones    "they passed" (07/05/2017)  . Hyperkalemia 04/18/2012  . Hyperlipidemia   . Hypertension   . HYPERTENSION 01/25/2007  . Hypoxia 05/05/2011  . Insomnia 05/13/2016  . INSOMNIA, HX OF 01/25/2007  . Knee pain, bilateral 12/28/2010  . Knee pain, right 12/28/2010  . NEUROMA 01/05/2010  . OBESITY NOS 01/25/2007  . OSA on CPAP 04/06/2010  . PERIPHERAL NEUROPATHY 01/05/2010  . PULMONARY FUNCTION TESTS, ABNORMAL 02/02/2010  . Superficial thrombophlebitis of left leg 09/07/2011  . TESTICULAR HYPOFUNCTION 01/05/2010  . 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 Toe pain, right 07/10/2012  . Type II diabetes mellitus ([place]) 12/05/2009  . UNSPECIFIED ANEMIA 01/05/2010    Past Surgical History:  Procedure Laterality Date  . A-FLUTTER ABLATION N/A 05/16/2019   Procedure: A-FLUTTER ABLATION;  Surgeon: Constance Haw, MD;  Location: [facility];  Service: Cardiovascular;  Laterality: N/A;  . ATRIAL FIBRILLATION ABLATION N/A 05/16/2019   Procedure: ATRIAL FIBRILLATION ABLATION;  Surgeon: Constance Haw, MD;  Location: [facility];  Service: Cardiovascular;  Laterality: N/A;  . CARDIAC CATHETERIZATION  10/16/2009   nonischemic cardiomyopathy  . CARDIOVERSION N/A 03/25/2015   Procedure: CARDIOVERSION;  Surgeon: Pixie Casino, MD;  Location: [facility];  Service: Cardiovascular;  Laterality: N/A;  . CARDIOVERSION N/A 06/17/2017    Procedure: CARDIOVERSION;  Surgeon: Sanda Klein, MD;  Location: [facility];  Service: Cardiovascular;  Laterality: N/A;  . CARDIOVERSION N/A 07/08/2017   Procedure: CARDIOVERSION;  Surgeon: Fay Records, MD;  Location: [name];  Service: Cardiovascular;  Laterality: N/A;  . CARDIOVERSION N/A 02/27/2019   Procedure: CARDIOVERSION;  Surgeon: Sanda Klein, MD;  Location: [facility];  Service: Cardiovascular;  Laterality: N/A;  . CARDIOVERSION N/A 08/16/2019   Procedure: CARDIOVERSION;  Surgeon: Sanda Klein, MD;  Location: [facility];  Service: Cardiovascular;  Laterality: N/A;  . METATARSAL OSTEOTOMY Bilateral ~ 1980   removed part of 5th metatarsal to corect curvature of toes   . RIGHT HEART CATH N/A 05/16/2019   Procedure: RIGHT HEART CATH;  Surgeon: Jolaine Artist, MD;  Location: [facility];  Service: Cardiovascular;  Laterality: N/A;  . TEE WITHOUT CARDIOVERSION  05/16/2019   Procedure: Transesophageal Echocardiogram (Tee);  Surgeon: Constance Haw, MD;  Location: [facility];  Service: Cardiovascular;;    Current Outpatient Medications  Medication Sig Dispense Refill  . albuterol (VENTOLIN HFA) 108 (90 Base) MCG/ACT inhaler Inhale 2 puffs into Kevin lungs every 6 (six) hours as needed for wheezing or shortness of breath. 1 Inhaler 6  . allopurinol (ZYLOPRIM) 300 MG tablet Take 1 tablet by mouth once daily ([name] taking differently: Take 300 mg by mouth daily. ) 90 tablet 0  . Blood Glucose Monitoring Suppl (ONETOUCH VERIO) w/Device KIT Use to check blood sugar TID as directed.  Dx Code: E11.9 1 kit 0  . calcium carbonate (TUMS - DOSED IN MG ELEMENTAL CALCIUM) 500 MG chewable tablet Chew 500 mg by mouth daily as needed for indigestion or heartburn.    . cetirizine (ZYRTEC) 10 MG tablet Take 1 tablet (10 mg total) by mouth daily. 30 tablet 11  . Clobetasol Prop Emollient Base (CLOBETASOL PROPIONATE E) 0.05 % emollient cream Apply 1 application  topically 2 (two) times daily as needed (insect bite). 60 g 1  . colchicine (COLCRYS) 0.6 MG tablet TAKE ONE TABLET BY MOUTH DAILY AS NEEDED FOR GOUT FLARE UPS. ([name] taking differently: Take 0.6 mg by mouth daily as needed (gout). ) 30 tablet 0  . dapagliflozin propanediol (FARXIGA) 10 MG TABS tablet Take 1 tablet (10 mg total) by mouth daily before breakfast. 30 tablet 6  . ELIQUIS 5 MG TABS tablet Take 1 tablet by mouth twice daily ([name] taking differently: Take 5 mg by mouth 2 (two) times daily. ) 180 tablet 1  . fish oil-omega-3 fatty acids 1000 MG capsule Take 1 g by mouth daily.     . furosemide (LASIX) 80 MG tablet Take 1 tablet (80 mg total) by mouth daily. As needed [name] may take an additional 80 MG tablet as direct per [employer] study PRN plan. ([name] taking differently: Take 80 mg by mouth See admin instructions. Take 80 mg daily, may take a second 80 mg dose as needed for swelling) 30 tablet 6  . glucose blood test strip (Verio) Use as instructed Check blood sugars TID DxE11.9 100 each 12  . glyBURIDE (DIABETA) 5 MG tablet Take 2.5 mg by mouth daily as needed (when eating a big meal).     [name]  hydrALAZINE (APRESOLINE) 25 MG tablet Take 1 tablet (25 mg total) by mouth 3 (three) times daily. 90 tablet 2  . HYDROcodone-acetaminophen (NORCO) 10-325 MG tablet Take 1 tablet by mouth every 8 (eight) hours as needed for moderate pain or severe pain. 90 tablet 0  . hydrocortisone 2.5 % ointment Apply topically 2 (two) times daily as needed. ([name] taking differently: Apply 1 application topically 2 (two) times daily as needed (irritation). ) 30 g 1  . isosorbide mononitrate (IMDUR) 30 MG 24 hr tablet Take 1 tablet (30 mg total) by mouth daily. 30 tablet 11  . LORazepam (ATIVAN) 1 MG tablet Take 1 tablet (1 mg total) by mouth every 8 (eight) hours as needed for anxiety. 30 tablet 5  . magnesium oxide (MAG-OX) 400 MG tablet Take 1 tablet (400 mg total) by mouth 2 (two) times daily.  30 tablet 11  . metoprolol succinate (TOPROL-XL) 50 MG 24 hr tablet Take 1 tablet (50 mg total) by mouth 2 (two) times daily. Take with or immediately following a meal. 180 tablet 3  . Multiple Vitamin (MULTIVITAMIN WITH MINERALS) TABS tablet Take 1 tablet by mouth daily.     . naproxen (NAPROSYN) 375 MG tablet Take 375 mg by mouth 2 (two) times daily as needed (knee pain.).     [name] Polyethyl Glycol-Propyl Glycol (SYSTANE OP) Place 1 drop into both eyes daily as needed (dry eyes).    . potassium chloride SA (KLOR-CON) 20 MEQ tablet Take 1 tablet (20 mEq total) by mouth as directed. 1 tablet daily 20 mEq by mouth as directed per alleviate research protocol. ([name] taking differently: Take 20 mEq by mouth daily. ) 180 tablet 1  . pravastatin (PRAVACHOL) 40 MG tablet Take 1 tablet by mouth once daily ([name] taking differently: Take 40 mg by mouth daily. ) 90 tablet 3  . tamsulosin (FLOMAX) 0.4 MG CAPS capsule Take 1 capsule by mouth once daily ([name] taking differently: Take 0.4 mg by mouth daily. ) 30 capsule 0  . temazepam (RESTORIL) 30 MG capsule Take 1 capsule by mouth at bedtime ([name] taking differently: Take 30 mg by mouth at bedtime. ) 30 capsule 0   No current facility-administered medications for this visit.    Allergies:   Sulfonamide derivatives and Zolpidem   Social History:  [name]  reports that he quit smoking about 39 years ago. He has a 10.00 pack-year smoking history. He has never used smokeless tobacco. He reports that he does not drink alcohol and does not use drugs.   Family History:  [name]'s  family history includes Aneurysm in his sister; Cancer in his father; Clotting disorder in his mother; Diabetes in his mother and sister; Heart attack in his mother; Heart disease in his mother; Hyperlipidemia in his mother; Hypertension in his mother and sister; Leukemia in his maternal grandmother; Lung disease in his father; Other in his sister; Seizures in his sister;  Stroke in his mother.   ROS:  Please see Kevin history of present illness.   All other systems are personally reviewed and negative.    Exam:    Vital Signs:  BP 113/79   Pulse 84   Temp 98.1 F (36.7 C)   Wt 184 lb 3.2 oz (83.6 kg)   SpO2 98%   BMI 25.69 kg/m   No acute distress, no shortness of breath.  Labs/Other Tests and Data Reviewed:    Recent Labs: 05/29/2019: Magnesium 2.1 06/11/2019: TSH 1.91 07/23/2019: ALT 23 08/02/2019: Pro  B Natriuretic peptide (BNP) 446.0 11/26/2019: B Natriuretic Peptide 349.6 02/25/2020: BUN 29; Creatinine, Ser 2.04; Hemoglobin 13.3; Platelets 183; Potassium 3.9; Sodium 139   Wt Readings from Last 3 Encounters:  02/29/20 184 lb 3.2 oz (83.6 kg)  01/22/20 187 lb 3.2 oz (84.9 kg)  01/17/20 189 lb 9.6 oz (86 kg)     Other studies personally reviewed: Additional studies/ records that were reviewed today include: ECG 01/17/20 personally reviewed  Review of Kevin above records today demonstrates:  Atrial fibrillation   ASSESSMENT & PLAN:    1.  Persistent atrial fibrillation/flutter: Currently on Eliquis with a CHA2DS2-VASc of 3.  Status post ablation 05/16/2019.  Unfortunately he did go into acute heart failure during of Kevin procedure and thus Kevin procedure was not completed.  We Acen Craun plan for repeat ablation as he has had further episodes of atrial fibrillation and has systolic heart failure when he is in A. fib.  Risks and benefits were discussed which include bleeding, tamponade, heart block, stroke, damage to chest organs.  He understands these risks and has agreed to Kevin procedure.  2.  Chronic systolic heart failure due to nonischemic cardiomyopathy: Ejection fraction is fortunately improved to 35 to 40%.  He is remained stable over several echoes.  Continue Toprol-XL, Imdur, hydralazine.  3.  Hypertension: well controlled   COVID 19 screen [name] denies symptoms of COVID 19 at this time.  Kevin importance of social distancing was discussed  today.  Follow-up:  3 months  Current medicines are reviewed at length with [name] today.   [name] does not have concerns regarding his medicines.  Kevin following changes were made today:  none  Labs/ tests ordered today include:  No orders of Kevin defined types were placed in this encounter.    [name] Risk:  after full review of this patients clinical status, I feel that they are at moderate risk at this time.  Today, I have spent 12 minutes with [name] with telehealth technology discussing AF .    Signed, Andriea Hasegawa Meredith [name], MD  02/29/2020 8:13 AM     [facility] [address] [place] [address] [phone] (office) [phone] (fax)

## 2020-03-04 ENCOUNTER — Telehealth: Payer: Self-pay | Admitting: *Deleted

## 2020-03-04 NOTE — Telephone Encounter (Signed)
Called to review updated procedure instructions. Covid screening scheduled for 9/30 & 10/1  (pt to be screened twice d/t length of time between TEE & ablation, per hospital). TEE instructions reviewed w/ pt, aware to arrive at 10:00 am Friday morning. Aware to quarantine after Covid screening/s. Patient verbalized understanding and agreeable to plan.

## 2020-03-05 ENCOUNTER — Ambulatory Visit (HOSPITAL_COMMUNITY): Payer: Medicare HMO

## 2020-03-06 ENCOUNTER — Other Ambulatory Visit (HOSPITAL_COMMUNITY)
Admission: RE | Admit: 2020-03-06 | Discharge: 2020-03-06 | Disposition: A | Discharge status: Home / Self Care | Payer: Medicare HMO | Source: Ambulatory Visit | Attending: Cardiology | Admitting: Cardiology

## 2020-03-06 DIAGNOSIS — Z01812 Encounter for preprocedural laboratory examination: Secondary | ICD-10-CM | POA: Diagnosis not present

## 2020-03-06 DIAGNOSIS — Z20822 Contact with and (suspected) exposure to covid-19: Secondary | ICD-10-CM | POA: Diagnosis not present

## 2020-03-06 LAB — SARS CORONAVIRUS 2 (TAT 6-24 HRS): SARS Coronavirus 2: NEGATIVE

## 2020-03-07 ENCOUNTER — Other Ambulatory Visit: Payer: Self-pay

## 2020-03-07 ENCOUNTER — Encounter (HOSPITAL_COMMUNITY)
Admission: RE | Disposition: A | Discharge status: Home / Self Care | Payer: Self-pay | Source: Ambulatory Visit | Attending: Cardiology

## 2020-03-07 ENCOUNTER — Ambulatory Visit (HOSPITAL_BASED_OUTPATIENT_CLINIC_OR_DEPARTMENT_OTHER)
Admission: RE | Admit: 2020-03-07 | Discharge: 2020-03-07 | Disposition: A | Discharge status: Home / Self Care | Payer: Medicare HMO | Source: Ambulatory Visit | Attending: Cardiology | Admitting: Cardiology

## 2020-03-07 ENCOUNTER — Ambulatory Visit (HOSPITAL_COMMUNITY)
Admission: RE | Admit: 2020-03-07 | Discharge: 2020-03-07 | Disposition: A | Discharge status: Home / Self Care | Payer: Medicare HMO | Source: Ambulatory Visit | Attending: Cardiology | Admitting: Cardiology

## 2020-03-07 ENCOUNTER — Ambulatory Visit (HOSPITAL_COMMUNITY): Payer: Medicare HMO | Admitting: Certified Registered Nurse Anesthetist

## 2020-03-07 ENCOUNTER — Encounter (HOSPITAL_COMMUNITY): Payer: Self-pay | Admitting: Cardiology

## 2020-03-07 DIAGNOSIS — Z79899 Other long term (current) drug therapy: Secondary | ICD-10-CM | POA: Diagnosis not present

## 2020-03-07 DIAGNOSIS — I340 Nonrheumatic mitral (valve) insufficiency: Secondary | ICD-10-CM

## 2020-03-07 DIAGNOSIS — I428 Other cardiomyopathies: Secondary | ICD-10-CM | POA: Insufficient documentation

## 2020-03-07 DIAGNOSIS — G4733 Obstructive sleep apnea (adult) (pediatric): Secondary | ICD-10-CM | POA: Insufficient documentation

## 2020-03-07 DIAGNOSIS — Z882 Allergy status to sulfonamides status: Secondary | ICD-10-CM | POA: Diagnosis not present

## 2020-03-07 DIAGNOSIS — Z7901 Long term (current) use of anticoagulants: Secondary | ICD-10-CM | POA: Insufficient documentation

## 2020-03-07 DIAGNOSIS — E1122 Type 2 diabetes mellitus with diabetic chronic kidney disease: Secondary | ICD-10-CM | POA: Insufficient documentation

## 2020-03-07 DIAGNOSIS — I5022 Chronic systolic (congestive) heart failure: Secondary | ICD-10-CM | POA: Diagnosis not present

## 2020-03-07 DIAGNOSIS — I480 Paroxysmal atrial fibrillation: Secondary | ICD-10-CM | POA: Diagnosis not present

## 2020-03-07 DIAGNOSIS — I130 Hypertensive heart and chronic kidney disease with heart failure and stage 1 through stage 4 chronic kidney disease, or unspecified chronic kidney disease: Secondary | ICD-10-CM | POA: Diagnosis not present

## 2020-03-07 DIAGNOSIS — M109 Gout, unspecified: Secondary | ICD-10-CM | POA: Insufficient documentation

## 2020-03-07 DIAGNOSIS — Z7984 Long term (current) use of oral hypoglycemic drugs: Secondary | ICD-10-CM | POA: Insufficient documentation

## 2020-03-07 DIAGNOSIS — I4892 Unspecified atrial flutter: Secondary | ICD-10-CM | POA: Insufficient documentation

## 2020-03-07 DIAGNOSIS — I4891 Unspecified atrial fibrillation: Secondary | ICD-10-CM | POA: Diagnosis not present

## 2020-03-07 DIAGNOSIS — I4819 Other persistent atrial fibrillation: Secondary | ICD-10-CM | POA: Diagnosis not present

## 2020-03-07 DIAGNOSIS — I350 Nonrheumatic aortic (valve) stenosis: Secondary | ICD-10-CM | POA: Diagnosis not present

## 2020-03-07 DIAGNOSIS — E785 Hyperlipidemia, unspecified: Secondary | ICD-10-CM | POA: Insufficient documentation

## 2020-03-07 DIAGNOSIS — Z87891 Personal history of nicotine dependence: Secondary | ICD-10-CM | POA: Insufficient documentation

## 2020-03-07 DIAGNOSIS — N189 Chronic kidney disease, unspecified: Secondary | ICD-10-CM | POA: Diagnosis not present

## 2020-03-07 DIAGNOSIS — I083 Combined rheumatic disorders of mitral, aortic and tricuspid valves: Secondary | ICD-10-CM | POA: Diagnosis not present

## 2020-03-07 DIAGNOSIS — I351 Nonrheumatic aortic (valve) insufficiency: Secondary | ICD-10-CM

## 2020-03-07 DIAGNOSIS — I5042 Chronic combined systolic (congestive) and diastolic (congestive) heart failure: Secondary | ICD-10-CM | POA: Diagnosis not present

## 2020-03-07 DIAGNOSIS — N179 Acute kidney failure, unspecified: Secondary | ICD-10-CM | POA: Diagnosis not present

## 2020-03-07 HISTORY — PX: TEE WITHOUT CARDIOVERSION: SHX5443

## 2020-03-07 LAB — GLUCOSE, CAPILLARY: Glucose-Capillary: 165 mg/dL — ABNORMAL HIGH (ref 70–99)

## 2020-03-07 SURGERY — ECHOCARDIOGRAM, TRANSESOPHAGEAL
Anesthesia: Monitor Anesthesia Care

## 2020-03-07 MED ORDER — PROPOFOL 500 MG/50ML IV EMUL
INTRAVENOUS | Status: DC | PRN
  Administered 2020-03-07: 100 ug/kg/min via INTRAVENOUS

## 2020-03-07 MED ORDER — PHENYLEPHRINE HCL (PRESSORS) 10 MG/ML IV SOLN
INTRAVENOUS | Status: DC | PRN
  Administered 2020-03-07: 80 ug via INTRAVENOUS

## 2020-03-07 MED ORDER — SODIUM CHLORIDE 0.9 % IV SOLN: INTRAVENOUS | Status: DC

## 2020-03-07 MED ORDER — PROPOFOL 10 MG/ML IV BOLUS
INTRAVENOUS | Status: DC | PRN
  Administered 2020-03-07: 20 mg via INTRAVENOUS

## 2020-03-07 MED ORDER — LIDOCAINE 2% (20 MG/ML) 5 ML SYRINGE
INTRAMUSCULAR | Status: DC | PRN
  Administered 2020-03-07: 80 mg via INTRAVENOUS

## 2020-03-07 NOTE — Anesthesia Postprocedure Evaluation (Signed)
Anesthesia Post Note  Patient: [name]  Procedure(s) Performed: TRANSESOPHAGEAL ECHOCARDIOGRAM (TEE) (N/A )     Patient location during evaluation: PACU Anesthesia Type: MAC Level of consciousness: awake and alert Pain management: pain level controlled Vital Signs Assessment: post-procedure vital signs reviewed and stable Respiratory status: spontaneous breathing Cardiovascular status: stable Anesthetic complications: no   No complications documented.  Last Vitals:  Vitals:   03/07/20 1210 03/07/20 1215  BP: 111/81   Pulse: 89   Resp: (!) 24   Temp:    SpO2: 96% 96%    Last Pain:  Vitals:   03/07/20 1210  TempSrc:   PainSc: 0-No pain                 Nolon Nations

## 2020-03-07 NOTE — Anesthesia Preprocedure Evaluation (Signed)
Anesthesia Evaluation  Patient identified by MRN, date of birth, ID band Patient awake    Reviewed: Allergy & Precautions, NPO status , Patient's Chart, lab work & pertinent test results  Airway Mallampati: II  TM Distance: >3 FB Neck ROM: Full    Dental  (+) Teeth Intact, Dental Advisory Given   Pulmonary shortness of breath, sleep apnea and Continuous Positive Airway Pressure Ventilation , former smoker,    Pulmonary exam normal breath sounds clear to auscultation       Cardiovascular hypertension, Pt. on medications and Pt. on home beta blockers +CHF  Normal cardiovascular exam+ dysrhythmias Atrial Fibrillation + Valvular Problems/Murmurs  Rhythm:Irregular Rate:Abnormal  12/12/2019 Echo 1. Left ventricular ejection fraction, by estimation, is 35 to 40%. The left ventricle has moderately decreased function. The left ventricle demonstrates global hypokinesis. Indeterminate diastolic filling due to E-A fusion.  2. Right ventricular systolic function is mildly reduced. The right ventricular size is mildly enlarged. There is moderately elevated pulmonary artery systolic pressure. The estimated right ventricular systolic pressure is 39.0 mmHg.  3. Left atrial size was moderately dilated.  4. The mitral valve is grossly normal. Mild mitral valve regurgitation. No evidence of mitral stenosis.  5. The aortic valve is tricuspid. Aortic valve regurgitation is mild to moderate. No aortic stenosis is present.  6. Pulmonic valve regurgitation is moderate.  7. Aortic dilatation noted. Aneurysm of the aortic root, measuring 45 mm.  8. The inferior vena cava is normal in size with greater than 50% respiratory variability, suggesting right atrial pressure of 3 mmHg.   Neuro/Psych PSYCHIATRIC DISORDERS Anxiety negative neurological ROS     GI/Hepatic negative GI ROS, Neg liver ROS,   Endo/Other  diabetes, Type 2  Renal/GU Renal diseaseCr  2.01 K+ 5.01     Musculoskeletal negative musculoskeletal ROS (+)   Abdominal   Peds  Hematology  (+) anemia , Hgb 10.0   Anesthesia Other Findings   Reproductive/Obstetrics                             Anesthesia Physical  Anesthesia Plan  ASA: IV  Anesthesia Plan: General   Post-op Pain Management:    Induction: Intravenous  PONV Risk Score and Plan: 2 and Treatment may vary due to age or medical condition, Propofol infusion and TIVA  Airway Management Planned: Nasal Cannula and Natural Airway  Additional Equipment: None  Intra-op Plan:   Post-operative Plan:   Informed Consent: I have reviewed the patients History and Physical, chart, labs and discussed the procedure including the risks, benefits and alternatives for the proposed anesthesia with the patient or authorized representative who has indicated his/her understanding and acceptance.     Dental advisory given  Plan Discussed with: CRNA  Anesthesia Plan Comments:         Anesthesia Quick Evaluation

## 2020-03-07 NOTE — Discharge Instructions (Signed)

## 2020-03-07 NOTE — Transfer of Care (Signed)
Immediate Anesthesia Transfer of Care Note  Patient: [name]  Procedure(s) Performed: TRANSESOPHAGEAL ECHOCARDIOGRAM (TEE) (N/A )  Patient Location: Endoscopy Unit  Anesthesia Type:MAC  Level of Consciousness: awake, alert , oriented, patient cooperative and responds to stimulation  Airway & Oxygen Therapy: Patient Spontanous Breathing and Patient connected to nasal cannula oxygen  Post-op Assessment: Report given to RN and Post -op Vital signs reviewed and stable  Post vital signs: Reviewed and stable  Last Vitals:  Vitals Value Taken Time  BP 98/39 03/07/20 1153  Temp 36.5 C 03/07/20 1153  Pulse 79 03/07/20 1153  Resp 20 03/07/20 1153  SpO2 97 % 03/07/20 1153    Last Pain:  Vitals:   03/07/20 1153  TempSrc: Axillary  PainSc:          Complications: No complications documented.

## 2020-03-07 NOTE — Interval H&P Note (Signed)
History and Physical Interval Note:  03/07/2020 11:14 AM  [name]  has presented today for surgery, with the diagnosis of AFIB.  The various methods of treatment have been discussed with the patient and family. After consideration of risks, benefits and other options for treatment, the patient has consented to  Procedure(s): TRANSESOPHAGEAL ECHOCARDIOGRAM (TEE) (N/A) as a surgical intervention.  The patient's history has been reviewed, patient examined, no change in status, stable for surgery.  I have reviewed the patient's chart and labs.  Questions were answered to the patient's satisfaction.     Freada Bergeron

## 2020-03-07 NOTE — Progress Notes (Signed)
°  Echocardiogram Echocardiogram Transesophageal has been performed.  [name] 03/07/2020, 11:59 AM

## 2020-03-07 NOTE — Procedures (Signed)
.      Transesophageal Echocardiogram Note  [name] [id number] [birth year]  Procedure: Transesophageal Echocardiogram Indications: Atrial fibrillation/atrial flutter  Procedure Details Consent: Obtained Time Out: Verified patient identification, verified procedure, site/side was marked, verified correct patient position, special equipment/implants available, Radiology Safety Procedures followed,  medications/allergies/relevent history reviewed, required imaging and test results available.  Performed  Medications: Propofol 190mg  Lidocaine 80mg  Sedation managed by anesthesia.  Left Ventrical:  Moderately reduced LV systolic funciton  Mitral Valve: Mildly thickened, mild MR  Aortic Valve: Sclerotic but no stenosis. Mild aortic regurgitation.  Tricuspid Valve: Trivial TR  Pulmonic Valve: No PR  Left Atrium/ Left atrial appendage: Smoke seen throughout the LA and LA appendage but no thrombus visualized. Low velocities in LAA.  Atrial septum: Iatrogenic PFO visualized from prior ablation procedure with flow from L-->R  Aorta: Grade 3 plaque. Aortic root is mildly-to-moderately dilated   Complications: No apparent complications Patient did tolerate procedure well.  Freada Bergeron, MD, [facility] 03/07/2020, 11:48 AM

## 2020-03-08 ENCOUNTER — Other Ambulatory Visit (HOSPITAL_COMMUNITY): Payer: Medicare HMO

## 2020-03-09 ENCOUNTER — Encounter (HOSPITAL_COMMUNITY): Payer: Self-pay | Admitting: Cardiology

## 2020-03-10 ENCOUNTER — Ambulatory Visit (HOSPITAL_COMMUNITY)
Admission: RE | Admit: 2020-03-10 | Discharge: 2020-03-10 | Disposition: A | Discharge status: Home / Self Care | Payer: Medicare HMO | Attending: Cardiology | Admitting: Cardiology

## 2020-03-10 ENCOUNTER — Ambulatory Visit (HOSPITAL_COMMUNITY): Payer: Medicare HMO | Admitting: Anesthesiology

## 2020-03-10 ENCOUNTER — Other Ambulatory Visit: Payer: Self-pay

## 2020-03-10 ENCOUNTER — Encounter (HOSPITAL_COMMUNITY)
Admission: RE | Disposition: A | Discharge status: Home / Self Care | Payer: Self-pay | Source: Home / Self Care | Attending: Cardiology

## 2020-03-10 DIAGNOSIS — Z888 Allergy status to other drugs, medicaments and biological substances status: Secondary | ICD-10-CM | POA: Diagnosis not present

## 2020-03-10 DIAGNOSIS — E785 Hyperlipidemia, unspecified: Secondary | ICD-10-CM | POA: Insufficient documentation

## 2020-03-10 DIAGNOSIS — Z7901 Long term (current) use of anticoagulants: Secondary | ICD-10-CM | POA: Diagnosis not present

## 2020-03-10 DIAGNOSIS — E1122 Type 2 diabetes mellitus with diabetic chronic kidney disease: Secondary | ICD-10-CM | POA: Insufficient documentation

## 2020-03-10 DIAGNOSIS — Z7984 Long term (current) use of oral hypoglycemic drugs: Secondary | ICD-10-CM | POA: Diagnosis not present

## 2020-03-10 DIAGNOSIS — Z87891 Personal history of nicotine dependence: Secondary | ICD-10-CM | POA: Diagnosis not present

## 2020-03-10 DIAGNOSIS — I428 Other cardiomyopathies: Secondary | ICD-10-CM | POA: Diagnosis not present

## 2020-03-10 DIAGNOSIS — I130 Hypertensive heart and chronic kidney disease with heart failure and stage 1 through stage 4 chronic kidney disease, or unspecified chronic kidney disease: Secondary | ICD-10-CM | POA: Insufficient documentation

## 2020-03-10 DIAGNOSIS — G4733 Obstructive sleep apnea (adult) (pediatric): Secondary | ICD-10-CM | POA: Insufficient documentation

## 2020-03-10 DIAGNOSIS — I4819 Other persistent atrial fibrillation: Secondary | ICD-10-CM | POA: Diagnosis not present

## 2020-03-10 DIAGNOSIS — I4892 Unspecified atrial flutter: Secondary | ICD-10-CM | POA: Insufficient documentation

## 2020-03-10 DIAGNOSIS — Z6825 Body mass index (BMI) 25.0-25.9, adult: Secondary | ICD-10-CM | POA: Insufficient documentation

## 2020-03-10 DIAGNOSIS — E669 Obesity, unspecified: Secondary | ICD-10-CM | POA: Insufficient documentation

## 2020-03-10 DIAGNOSIS — I480 Paroxysmal atrial fibrillation: Secondary | ICD-10-CM | POA: Diagnosis not present

## 2020-03-10 DIAGNOSIS — N400 Enlarged prostate without lower urinary tract symptoms: Secondary | ICD-10-CM | POA: Diagnosis not present

## 2020-03-10 DIAGNOSIS — G4700 Insomnia, unspecified: Secondary | ICD-10-CM | POA: Insufficient documentation

## 2020-03-10 DIAGNOSIS — I10 Essential (primary) hypertension: Secondary | ICD-10-CM | POA: Diagnosis not present

## 2020-03-10 DIAGNOSIS — Z79899 Other long term (current) drug therapy: Secondary | ICD-10-CM | POA: Diagnosis not present

## 2020-03-10 DIAGNOSIS — Z8249 Family history of ischemic heart disease and other diseases of the circulatory system: Secondary | ICD-10-CM | POA: Diagnosis not present

## 2020-03-10 DIAGNOSIS — Z882 Allergy status to sulfonamides status: Secondary | ICD-10-CM | POA: Diagnosis not present

## 2020-03-10 DIAGNOSIS — Z833 Family history of diabetes mellitus: Secondary | ICD-10-CM | POA: Diagnosis not present

## 2020-03-10 DIAGNOSIS — I5022 Chronic systolic (congestive) heart failure: Secondary | ICD-10-CM | POA: Diagnosis not present

## 2020-03-10 DIAGNOSIS — N189 Chronic kidney disease, unspecified: Secondary | ICD-10-CM | POA: Insufficient documentation

## 2020-03-10 HISTORY — PX: ATRIAL FIBRILLATION ABLATION: EP1191

## 2020-03-10 LAB — GLUCOSE, CAPILLARY
Glucose-Capillary: 154 mg/dL — ABNORMAL HIGH (ref 70–99)
Glucose-Capillary: 174 mg/dL — ABNORMAL HIGH (ref 70–99)
Glucose-Capillary: 184 mg/dL — ABNORMAL HIGH (ref 70–99)

## 2020-03-10 LAB — POCT ACTIVATED CLOTTING TIME
Activated Clotting Time: 268 seconds
Activated Clotting Time: 312 seconds
Activated Clotting Time: 340 seconds

## 2020-03-10 SURGERY — ATRIAL FIBRILLATION ABLATION
Anesthesia: General

## 2020-03-10 MED ORDER — SUGAMMADEX SODIUM 200 MG/2ML IV SOLN
INTRAVENOUS | Status: DC | PRN
  Administered 2020-03-10: 200 mg via INTRAVENOUS

## 2020-03-10 MED ORDER — SODIUM CHLORIDE 0.9% FLUSH: 3.0000 mL | Freq: Two times a day (BID) | INTRAVENOUS | Status: DC

## 2020-03-10 MED ORDER — HEPARIN (PORCINE) IN NACL 1000-0.9 UT/500ML-% IV SOLN
INTRAVENOUS | Status: DC | PRN
  Administered 2020-03-10 (×5): 500 mL

## 2020-03-10 MED ORDER — SODIUM CHLORIDE 0.9 % IV SOLN: 250.0000 mL | INTRAVENOUS | Status: DC | PRN

## 2020-03-10 MED ORDER — HEPARIN SODIUM (PORCINE) 1000 UNIT/ML IJ SOLN
INTRAMUSCULAR | Status: DC | PRN
  Administered 2020-03-10: 1000 [IU] via INTRAVENOUS

## 2020-03-10 MED ORDER — LIDOCAINE 2% (20 MG/ML) 5 ML SYRINGE
INTRAMUSCULAR | Status: DC | PRN
  Administered 2020-03-10: 40 mg via INTRAVENOUS

## 2020-03-10 MED ORDER — DEXAMETHASONE SODIUM PHOSPHATE 10 MG/ML IJ SOLN
INTRAMUSCULAR | Status: DC | PRN
  Administered 2020-03-10: 5 mg via INTRAVENOUS

## 2020-03-10 MED ORDER — ONDANSETRON HCL 4 MG/2ML IJ SOLN
INTRAMUSCULAR | Status: DC | PRN
  Administered 2020-03-10: 4 mg via INTRAVENOUS

## 2020-03-10 MED ORDER — HEPARIN SODIUM (PORCINE) 1000 UNIT/ML IJ SOLN
INTRAMUSCULAR | Status: AC
  Filled 2020-03-10: qty 1

## 2020-03-10 MED ORDER — HEPARIN SODIUM (PORCINE) 1000 UNIT/ML IJ SOLN
INTRAMUSCULAR | Status: DC | PRN
  Administered 2020-03-10: 1000 [IU] via INTRAVENOUS
  Administered 2020-03-10: 15000 [IU] via INTRAVENOUS
  Administered 2020-03-10: 6000 [IU] via INTRAVENOUS

## 2020-03-10 MED ORDER — MIDAZOLAM HCL 2 MG/2ML IJ SOLN
INTRAMUSCULAR | Status: DC | PRN
  Administered 2020-03-10: 2 mg via INTRAVENOUS

## 2020-03-10 MED ORDER — PROTAMINE SULFATE 10 MG/ML IV SOLN
INTRAVENOUS | Status: DC | PRN
  Administered 2020-03-10: 40 mg via INTRAVENOUS

## 2020-03-10 MED ORDER — SODIUM CHLORIDE 0.9 % IV SOLN: INTRAVENOUS | Status: DC

## 2020-03-10 MED ORDER — SODIUM CHLORIDE 0.9% FLUSH: 3.0000 mL | INTRAVENOUS | Status: DC | PRN

## 2020-03-10 MED ORDER — ACETAMINOPHEN 325 MG PO TABS
650.0000 mg | ORAL_TABLET | ORAL | Status: DC | PRN
  Filled 2020-03-10: qty 2

## 2020-03-10 MED ORDER — PROPOFOL 10 MG/ML IV BOLUS
INTRAVENOUS | Status: DC | PRN
  Administered 2020-03-10: 50 mg via INTRAVENOUS
  Administered 2020-03-10: 100 mg via INTRAVENOUS
  Administered 2020-03-10: 50 mg via INTRAVENOUS

## 2020-03-10 MED ORDER — FENTANYL CITRATE (PF) 250 MCG/5ML IJ SOLN
INTRAMUSCULAR | Status: DC | PRN
Start: 2020-03-10 — End: 2020-03-10
  Administered 2020-03-10: 50 ug via INTRAVENOUS

## 2020-03-10 MED ORDER — HEPARIN (PORCINE) IN NACL 1000-0.9 UT/500ML-% IV SOLN
INTRAVENOUS | Status: AC
  Filled 2020-03-10: qty 2500

## 2020-03-10 MED ORDER — PHENYLEPHRINE HCL-NACL 10-0.9 MG/250ML-% IV SOLN
INTRAVENOUS | Status: DC | PRN
  Administered 2020-03-10: 25 ug/min via INTRAVENOUS

## 2020-03-10 MED ORDER — ONDANSETRON HCL 4 MG/2ML IJ SOLN: 4.0000 mg | Freq: Four times a day (QID) | INTRAMUSCULAR | Status: DC | PRN

## 2020-03-10 MED ORDER — ROCURONIUM BROMIDE 10 MG/ML (PF) SYRINGE
PREFILLED_SYRINGE | INTRAVENOUS | Status: DC | PRN
  Administered 2020-03-10 (×2): 50 mg via INTRAVENOUS

## 2020-03-10 MED ORDER — PHENYLEPHRINE 40 MCG/ML (10ML) SYRINGE FOR IV PUSH (FOR BLOOD PRESSURE SUPPORT)
PREFILLED_SYRINGE | INTRAVENOUS | Status: DC | PRN
  Administered 2020-03-10: 60 ug via INTRAVENOUS
  Administered 2020-03-10: 40 ug via INTRAVENOUS

## 2020-03-10 SURGICAL SUPPLY — 20 items
BLANKET WARM UNDERBOD FULL ACC (MISCELLANEOUS) ×3 IMPLANT
CATH 8FR REPROCESSED SOUNDSTAR (CATHETERS) ×3 IMPLANT
CATH MAPPNG PENTARAY F 2-6-2MM (CATHETERS) ×1 IMPLANT
CATH S CIRCA THERM PROBE 10F (CATHETERS) ×3 IMPLANT
CATH SMTCH THERMOCOOL SF DF (CATHETERS) ×3 IMPLANT
CATH WEBSTER BI DIR CS D-F CRV (CATHETERS) ×3 IMPLANT
CLOSURE PERCLOSE PROSTYLE (VASCULAR PRODUCTS) ×12 IMPLANT
COVER SWIFTLINK CONNECTOR (BAG) ×3 IMPLANT
KIT VERSACROSS STEERABLE D1 (CATHETERS) ×3 IMPLANT
PACK EP LATEX FREE (CUSTOM PROCEDURE TRAY) ×3
PACK EP LF (CUSTOM PROCEDURE TRAY) ×1 IMPLANT
PAD PRO RADIOLUCENT 2001M-C (PAD) ×3 IMPLANT
PATCH CARTO3 (PAD) ×3 IMPLANT
PENTARAY F 2-6-2MM (CATHETERS) ×3
SHEATH CARTO VIZIGO SM CVD (SHEATH) ×3 IMPLANT
SHEATH PINNACLE 7F 10CM (SHEATH) ×3 IMPLANT
SHEATH PINNACLE 8F 10CM (SHEATH) ×6 IMPLANT
SHEATH PINNACLE 9F 10CM (SHEATH) ×3 IMPLANT
SHEATH PROBE COVER 6X72 (BAG) ×3 IMPLANT
TUBING SMART ABLATE COOLFLOW (TUBING) ×3 IMPLANT

## 2020-03-10 NOTE — Transfer of Care (Signed)
Immediate Anesthesia Transfer of Care Note  Patient: [name]  Procedure(s) Performed: ATRIAL FIBRILLATION ABLATION (N/A )  Patient Location: Cath Lab  Anesthesia Type:General  Level of Consciousness: awake, alert  and oriented  Airway & Oxygen Therapy: Patient Spontanous Breathing and Patient connected to nasal cannula oxygen  Post-op Assessment: Report given to RN and Post -op Vital signs reviewed and stable  Post vital signs: Reviewed and stable  Last Vitals:  Vitals Value Taken Time  BP 94/61 03/10/20 1056  Temp 36.4 C 03/10/20 1056  Pulse 73 03/10/20 1057  Resp 17 03/10/20 1057  SpO2 90 % 03/10/20 1057  Vitals shown include unvalidated device data.  Last Pain:  Vitals:   03/10/20 1056  TempSrc: Temporal  PainSc: 0-No pain         Complications: No complications documented.

## 2020-03-10 NOTE — Anesthesia Procedure Notes (Signed)
Procedure Name: Intubation Date/Time: 03/10/2020 7:52 AM Performed by: Valda Favia, CRNA Pre-anesthesia Checklist: Patient identified, Emergency Drugs available, Suction available and Patient being monitored Patient Re-evaluated:Patient Re-evaluated prior to induction Oxygen Delivery Method: Circle System Utilized Preoxygenation: Pre-oxygenation with 100% oxygen Induction Type: IV induction Ventilation: Mask ventilation without difficulty and Oral airway inserted - appropriate to patient size Laryngoscope Size: Mac and 4 Grade View: Grade I Tube type: Oral Tube size: 7.5 mm Number of attempts: 1 Airway Equipment and Method: Stylet and Oral airway Placement Confirmation: ETT inserted through vocal cords under direct vision,  positive ETCO2 and breath sounds checked- equal and bilateral Secured at: 22 cm Tube secured with: Tape Dental Injury: Teeth and Oropharynx as per pre-operative assessment

## 2020-03-10 NOTE — Anesthesia Preprocedure Evaluation (Signed)
Anesthesia Evaluation  Patient identified by MRN, date of birth, ID band Patient awake    Reviewed: Allergy & Precautions, NPO status , Patient's Chart, lab work & pertinent test results  Airway Mallampati: II  TM Distance: >3 FB Neck ROM: Full    Dental  (+) Teeth Intact, Dental Advisory Given   Pulmonary former smoker,    breath sounds clear to auscultation       Cardiovascular hypertension,  Rhythm:Regular Rate:Normal     Neuro/Psych    GI/Hepatic   Endo/Other  diabetes  Renal/GU      Musculoskeletal   Abdominal   Peds  Hematology   Anesthesia Other Findings   Reproductive/Obstetrics                             Anesthesia Physical Anesthesia Plan  ASA: III  Anesthesia Plan: General   Post-op Pain Management:    Induction: Intravenous  PONV Risk Score and Plan: Ondansetron and Dexamethasone  Airway Management Planned: Oral ETT  Additional Equipment:   Intra-op Plan:   Post-operative Plan: Extubation in OR  Informed Consent: I have reviewed the patients History and Physical, chart, labs and discussed the procedure including the risks, benefits and alternatives for the proposed anesthesia with the patient or authorized representative who has indicated his/her understanding and acceptance.   Dental advisory given  Plan Discussed with: CRNA and Anesthesiologist  Anesthesia Plan Comments:         Anesthesia Quick Evaluation  

## 2020-03-10 NOTE — Interval H&P Note (Signed)
History and Physical Interval Note:  [name] has presented today for surgery, with the diagnosis of atrial fibrillation.  The various methods of treatment have been discussed with the patient and family. After consideration of risks, benefits and other options for treatment, the patient has consented to  Procedure(s): Catheter ablation as a surgical intervention .  Risks include but not limited to bleeding, vascular damage, tamponade, heart block, stroke, damage to surrounding organs, among others. The patient's history has been reviewed, patient examined, no change in status, stable for surgery.  I have reviewed the patient's chart and labs.  Questions were answered to the patient's satisfaction.    Bradie Lacock Curt Bears, MD 03/10/2020 7:06 AM

## 2020-03-10 NOTE — Anesthesia Postprocedure Evaluation (Signed)
Anesthesia Post Note  Patient: [name]  Procedure(s) Performed: ATRIAL FIBRILLATION ABLATION (N/A )     Patient location during evaluation: Cath Lab Anesthesia Type: General Level of consciousness: awake and alert Pain management: pain level controlled Vital Signs Assessment: post-procedure vital signs reviewed and stable Respiratory status: spontaneous breathing, nonlabored ventilation, respiratory function stable and patient connected to nasal cannula oxygen Cardiovascular status: blood pressure returned to baseline and stable Postop Assessment: no apparent nausea or vomiting Anesthetic complications: no   No complications documented.  Last Vitals:  Vitals:   03/10/20 1330 03/10/20 1400  BP: 100/68 103/71  Pulse: 73 73  Resp: 19 14  Temp:    SpO2: 91% (!) 85%    Last Pain:  Vitals:   03/10/20 1145  TempSrc:   PainSc: 0-No pain                 Ngina Royer COKER

## 2020-03-10 NOTE — Discharge Instructions (Signed)
Cardiac Ablation, Care After  This sheet gives you information about how to care for yourself after your procedure. Your health care provider may also give you more specific instructions. If you have problems or questions, contact your health care provider. What can I expect after the procedure? After the procedure, it is common to have:  Bruising around your puncture site.  Tenderness around your puncture site.  Skipped heartbeats.  Tiredness (fatigue).  Follow these instructions at home: Puncture site care   Follow instructions from your health care provider about how to take care of your puncture site. Make sure you: ? If present, leave stitches (sutures), skin glue, or adhesive strips in place. These skin closures may need to stay in place for up to 2 weeks. If adhesive strip edges start to loosen and curl up, you may trim the loose edges. Do not remove adhesive strips completely unless your health care provider tells you to do that. ? Remove dressing in 24 hours. Then you may shower.  Check your puncture site every day for signs of infection. Check for: ? Redness, swelling, or pain. ? Fluid or blood. If your puncture site starts to bleed, lie down on your back, apply firm pressure to the area, and contact your health care provider. ? Warmth. ? Pus or a bad smell. Driving  Do not drive for at least 4 days after your procedure or however long your health care provider recommends. (Do not resume driving if you have previously been instructed not to drive for other health reasons.)  Do not drive or use heavy machinery while taking prescription pain medicine. Activity  Avoid activities that take a lot of effort for at least 7 days after your procedure.  Do not lift anything that is heavier than 5 lb (4.5 kg) for one week.   No sexual activity for 1 week.   Return to your normal activities as told by your health care provider. Ask your health care provider what activities are  safe for you. General instructions  Take over-the-counter and prescription medicines only as told by your health care provider.  Do not use any products that contain nicotine or tobacco, such as cigarettes and e-cigarettes. If you need help quitting, ask your health care provider.  You may shower after 24 hours, but Do not take baths, swim, or use a hot tub for 1 week.   Do not drink alcohol for 24 hours after your procedure.  Keep all follow-up visits as told by your health care provider. This is important. Contact a health care provider if:  You have redness, mild swelling, or pain around your puncture site.  You have fluid or blood coming from your puncture site that stops after applying firm pressure to the area.  Your puncture site feels warm to the touch.  You have pus or a bad smell coming from your puncture site.  You have a fever.  You have chest pain or discomfort that spreads to your neck, jaw, or arm.  You are sweating a lot.  You feel nauseous.  You have a fast or irregular heartbeat.  You have shortness of breath.  You are dizzy or light-headed and feel the need to lie down.  You have pain or numbness in the arm or leg closest to your puncture site. Get help right away if:  Your puncture site suddenly swells.  Your puncture site is bleeding and the bleeding does not stop after applying firm pressure to the area. These  symptoms may represent a serious problem that is an emergency. Do not wait to see if the symptoms will go away. Get medical help right away. Call your local emergency services (911 in the [place] Do not drive yourself to the hospital. Summary  After the procedure, it is normal to have bruising and tenderness at the puncture site in your groin, neck, or forearm.  Check your puncture site every day for signs of infection.  Get help right away if your puncture site is bleeding and the bleeding does not stop after applying firm pressure to the  area. This is a medical emergency. This information is not intended to replace advice given to you by your health care provider. Make sure you discuss any questions you have with your health care provider.

## 2020-03-10 NOTE — Progress Notes (Signed)
Instructions reviewed with wife Danette. Verbalized understanding.

## 2020-03-11 ENCOUNTER — Encounter (HOSPITAL_COMMUNITY): Payer: Self-pay | Admitting: Cardiology

## 2020-03-11 ENCOUNTER — Other Ambulatory Visit: Payer: Self-pay | Admitting: Internal Medicine

## 2020-03-11 ENCOUNTER — Encounter: Payer: Medicare HMO | Admitting: Cardiology

## 2020-03-14 DIAGNOSIS — G4733 Obstructive sleep apnea (adult) (pediatric): Secondary | ICD-10-CM | POA: Diagnosis not present

## 2020-03-17 ENCOUNTER — Other Ambulatory Visit: Payer: Self-pay | Admitting: Family Medicine

## 2020-03-17 DIAGNOSIS — Z006 Encounter for examination for normal comparison and control in clinical research program: Secondary | ICD-10-CM

## 2020-03-17 NOTE — Research (Signed)
Alleviate Research Study  Carelink Transmission       

## 2020-03-17 NOTE — Telephone Encounter (Signed)
Last written:01/12/20 Last ov:12/24/19 Next ov:05/22/20 Contract:05/02/18 UDS:05/02/18

## 2020-03-23 ENCOUNTER — Other Ambulatory Visit: Payer: Self-pay | Admitting: Cardiology

## 2020-03-25 ENCOUNTER — Other Ambulatory Visit: Payer: Self-pay

## 2020-03-25 MED ORDER — HYDRALAZINE HCL 25 MG PO TABS: 25.0000 mg | ORAL_TABLET | Freq: Three times a day (TID) | ORAL | 2 refills | Status: DC

## 2020-03-25 MED ORDER — HYDRALAZINE HCL 25 MG PO TABS: 25.0000 mg | ORAL_TABLET | Freq: Three times a day (TID) | ORAL | 3 refills | Status: DC

## 2020-03-25 NOTE — Telephone Encounter (Signed)
Pt's medication was sent to pt's pharmacy as requested. Confirmation received.  °

## 2020-03-26 DIAGNOSIS — Z006 Encounter for examination for normal comparison and control in clinical research program: Secondary | ICD-10-CM

## 2020-03-26 NOTE — Research (Addendum)
Alleviate Research Study  Alleviate PRN Medication plan initiated, we will continue to monitor patient. Patient will come in to research clinic tomorrow.                Data reviewed. There was a very abrupt reduction in impedance that occurred immediately following his ablation procedure and is gradually returning to baseline.  He does not have symptoms of heart failure exacerbation or clinical exam evidence for hypervolemia (no JVD, edema or weight change). He feels well. Suspect the impedance change reflects changes related to the procedure (e.g. post-procedural pericardial inflammation/effusion) that are gradually resolving. Therefore we will rescind the enhanced treatment plan and go back to his standard diuretic dosing regimen.  Sanda Klein, MD, [facility] [phone] office [phone] pager

## 2020-03-27 ENCOUNTER — Telehealth: Payer: Self-pay | Admitting: *Deleted

## 2020-03-27 ENCOUNTER — Other Ambulatory Visit: Payer: Self-pay

## 2020-03-27 VITALS — BP 101/66 | HR 98 | Temp 98.1°F | Resp 14 | Wt 182.0 lb

## 2020-03-27 DIAGNOSIS — Z006 Encounter for examination for normal comparison and control in clinical research program: Secondary | ICD-10-CM

## 2020-03-27 MED ORDER — COLCHICINE 0.6 MG PO TABS: ORAL_TABLET | ORAL | 3 refills | Status: AC

## 2020-03-27 NOTE — Research (Addendum)
  Data reviewed. There was a very abrupt reduction in impedance that occurred immediately following his ablation procedure and is gradually returning to baseline.  He does not have symptoms of heart failure exacerbation or clinical exam evidence for hypervolemia (no JVD, edema or weight change). He feels well. Suspect the impedance change reflects changes related to the procedure (e.g. post-procedural pericardial inflammation/effusion) that are gradually resolving. Therefore we will rescind the enhanced treatment plan and go back to his standard diuretic dosing regimen.  Sanda Klein, MD, [facility] [phone] office [phone] pager   Alleviate Research Study  Patient PRN Medication plan stopped, patient to resume normal Lasix and Potassium dosage per Dr Sallyanne Kuster. Loop Linq Device downloaded and patient assistant marked.  12 Lead Ekg completed

## 2020-03-27 NOTE — Telephone Encounter (Signed)
-----   Message from Sanda Klein, MD sent at 03/27/2020  4:47 PM EDT ----- Please send in a Rx for colchicine 0.6 mg, #15, RF 3. Take one daily for 4-6 days as needed for gout flares.

## 2020-03-28 DIAGNOSIS — Z006 Encounter for examination for normal comparison and control in clinical research program: Secondary | ICD-10-CM

## 2020-03-28 MED ORDER — FUROSEMIDE 80 MG PO TABS
80.0000 mg | ORAL_TABLET | Freq: Every day | ORAL | 6 refills | Status: DC
Start: 2020-03-28 — End: 2020-07-09

## 2020-03-28 NOTE — Research (Addendum)
Alleviate Research Study  Patient Alleviate PRN Medication prescription changed. Patient will continue 80 MG Lasix daily and may take an additional 40 MG Lasix as needed as directed per Alleviate PRN Intervention medication plan.  Agree with change in plan Sanda Klein, MD, [facility] [phone] office [phone] pager

## 2020-04-02 DIAGNOSIS — Z006 Encounter for examination for normal comparison and control in clinical research program: Secondary | ICD-10-CM

## 2020-04-02 NOTE — Research (Addendum)
Alleviate Research Study  Informed consent for version 7.0 signed by patient.  Alleviate Informed Reconsent   Subject Name: [name]  Subject met inclusion and exclusion criteria.  The informed consent form, study requirements and expectations were reviewed with the subject and questions and concerns were addressed prior to the signing of the consent form.  The subject verbalized understanding of the trial requirements.  The subject agreed to participate in the Alleviate trial and signed the informed consent on 04/02/2020.  The informed consent was obtained prior to performance of any protocol-specific procedures for the subject.  A copy of the signed informed consent was given to the subject and a copy was placed in the subject's medical record.   Tanaka Gillen

## 2020-04-03 ENCOUNTER — Telehealth: Payer: Self-pay

## 2020-04-03 ENCOUNTER — Telehealth (INDEPENDENT_AMBULATORY_CARE_PROVIDER_SITE_OTHER): Payer: Medicare HMO | Admitting: Physician Assistant

## 2020-04-03 ENCOUNTER — Encounter: Payer: Self-pay | Admitting: Physician Assistant

## 2020-04-03 VITALS — BP 105/72 | HR 62 | Temp 96.8°F | Ht 71.0 in | Wt 186.2 lb

## 2020-04-03 DIAGNOSIS — I480 Paroxysmal atrial fibrillation: Secondary | ICD-10-CM | POA: Diagnosis not present

## 2020-04-03 DIAGNOSIS — I10 Essential (primary) hypertension: Secondary | ICD-10-CM | POA: Diagnosis not present

## 2020-04-03 DIAGNOSIS — I429 Cardiomyopathy, unspecified: Secondary | ICD-10-CM | POA: Diagnosis not present

## 2020-04-03 DIAGNOSIS — N183 Chronic kidney disease, stage 3 unspecified: Secondary | ICD-10-CM

## 2020-04-03 DIAGNOSIS — I4892 Unspecified atrial flutter: Secondary | ICD-10-CM

## 2020-04-03 NOTE — Patient Instructions (Addendum)
Medication Instructions:  Your physician recommends that you continue on your current medications as directed. Please refer to the Current Medication list given to you today.  *If you need a refill on your cardiac medications before your next appointment, please call your pharmacy*  Lab Work: NONE ordered at this time of appointment   If you have labs (blood work) drawn today and your tests are completely normal, you will receive your results only by: . MyChart Message (if you have MyChart) OR . A paper copy in the mail If you have any lab test that is abnormal or we need to change your treatment, we will call you to review the results.  Testing/Procedures: NONE ordered at this time of appointment   Follow-Up: At [facility], you and your health needs are our priority.  As part of our continuing mission to provide you with exceptional heart care, we have created designated Provider Care Teams.  These Care Teams include your primary Cardiologist (physician) and Advanced Practice Providers (APPs -  Physician Assistants and Nurse Practitioners) who all work together to provide you with the care you need, when you need it.  Your next appointment:   3-4 month(s)  The format for your next appointment:   In Person  Provider:   Mihai Croitoru, MD  Other Instructions   

## 2020-04-03 NOTE — Telephone Encounter (Signed)
Called patient to discuss AVS instructions gave Hao Meng's recommendations and patient voiced understanding. AVS summary mailed to patient.    

## 2020-04-03 NOTE — Progress Notes (Signed)
Virtual Visit via Telephone Note   This visit type was conducted due to national recommendations for restrictions regarding the COVID-19 Pandemic (e.g. social distancing) in an effort to limit this patient's exposure and mitigate transmission in our community.  Due to his co-morbid illnesses, this patient is at least at moderate risk for complications without adequate follow up.  This format is felt to be most appropriate for this patient at this time.  The patient did not have access to video technology/had technical difficulties with video requiring transitioning to audio format only (telephone).  All issues noted in this document were discussed and addressed.  No physical exam could be performed with this format.  Please refer to the patient's chart for his  consent to telehealth for [facility].    Date:  04/05/2020   ID:  [name], DOB 07-Mar-1951, MRN [id number] The patient was identified using 2 identifiers.  Patient Location: Home Provider Location: Office/Clinic  PCP:  [name], MD  Cardiologist:  Sanda Klein, MD  Electrophysiologist:  Constance Haw, MD   Evaluation Performed:  Follow-Up Visit  Chief Complaint:  Follow up  History of Present Illness:    [name] is a 69 y.o. male with PMH of PAF on Eliquis, atrial flutter, HTN, OSA, CKD stage III, tachy mediated cardiomyopathy. Patient underwent a PVI in December 2020, postprocedure, he had cardiogenic shock and respiratory failure. He ended up intubated and was placed on pressors for some time. He was eventually discharged home 13 days later. During the admission, though flow diet was switched to amiodarone due to renal insufficiency. Amiodarone was later stopped due to tremors. Loop recorder picked up increased A. fib burden in July 2021. He was seen by Dr. Curt [facility] on 02/29/2020 who recommended redo ablation to help manage recurrence of atrial fibrillation. TEE performed on 03/07/2020 showed  moderately reduced LV function, mild MR, smoke seen throughout the left atrium appendage but no thrombus visualized, iatrogenic PFO noted in atrium from the prior ablation. Patient was seen eventually taken back to the EP lab on 03/10/2020 and underwent EP study with 3-dimensional mapping of atrial fibrillation, he underwent successful ablation. Initial EKG obtained post procedure demonstrated sinus rhythm with first-degree AV block, however subsequent EKG obtained on 03/27/2020 showed atrial flutter with heart rate 90 bpm. Device interrogation revealed very abrupt reduction in the incidence occurred immediately following ablation and is gradually returning to the baseline, this was felt to be procedure related and not due to acute heart failure.  Patient presents today for virtual visit.  He denies any recent chest pain or shortness of breath.  He remained on 80 mg daily of Lasix.  He does not have any cardiac awareness of palpitation.  I recommend he has a repeat EKG next Monday during his visit with A. fib clinic to see if atrial flutter is persistent versus transient.  He is doing well and there is no physical limitations.  He denies any lower extremity edema, orthopnea or PND.  He can follow-up with Dr. Sallyanne Kuster in 3 to 4 months after his visit with Dr. Curt [facility] in January.  Also he asked Dr. Sallyanne Kuster for refill on his colchicine, however was unable to pick it up as his pharmacy recommended he use a different medication.  I wonder if the medication they were referring to is a generic version of Colcrys, we will check with the pharmacy to verify.  Patient prefer to stick with the previous Colcrys  The patient  does not have symptoms concerning for COVID-19 infection (fever, chills, cough, or new shortness of breath).    Past Medical History:  Diagnosis Date  . AORTIC STENOSIS, MODERATE 12/05/2009  . Atrial fibrillation ([place])   . Atrial fibrillation with RVR ([place]) 07/05/2017  . Atrial flutter ([place])  12/05/2009  . Atypical chest pain 11/08/2011  . BENIGN PROSTATIC HYPERTROPHY, HX OF 12/05/2009  . CHEST PAIN-UNSPECIFIED 11/11/2009  . CHF 12/05/2009  . Chronic renal insufficiency 12/16/2016  . Debility 04/18/2012  . Diarrhea 12/16/2016  . DYSPNEA 12/19/2009  . Edema 04/18/2012  . ERECTILE DYSFUNCTION 01/25/2007  . FATIGUE, CHRONIC 11/11/2009  . Gout 07/10/2012  . Heart murmur   . History of kidney stones    "they passed" (07/05/2017)  . Hyperkalemia 04/18/2012  . Hyperlipidemia   . Hypertension   . HYPERTENSION 01/25/2007  . Hypoxia 05/05/2011  . Insomnia 05/13/2016  . INSOMNIA, HX OF 01/25/2007  . Knee pain, bilateral 12/28/2010  . Knee pain, right 12/28/2010  . NEUROMA 01/05/2010  . OBESITY NOS 01/25/2007  . OSA on CPAP 04/06/2010  . PERIPHERAL NEUROPATHY 01/05/2010  . PULMONARY FUNCTION TESTS, ABNORMAL 02/02/2010  . Superficial thrombophlebitis of left leg 09/07/2011  . TESTICULAR HYPOFUNCTION 01/05/2010  . 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 Toe pain, right 07/10/2012  . Type II diabetes mellitus ([place]) 12/05/2009  . UNSPECIFIED ANEMIA 01/05/2010   Past Surgical History:  Procedure Laterality Date  . A-FLUTTER ABLATION N/A 05/16/2019   Procedure: A-FLUTTER ABLATION;  Surgeon: Constance Haw, MD;  Location: [place] [facility];  Service: Cardiovascular;  Laterality: N/A;  . ATRIAL FIBRILLATION ABLATION N/A 05/16/2019   Procedure: ATRIAL FIBRILLATION ABLATION;  Surgeon: Constance Haw, MD;  Location: [place] [facility];  Service: Cardiovascular;  Laterality: N/A;  . ATRIAL FIBRILLATION ABLATION N/A 03/10/2020   Procedure: ATRIAL FIBRILLATION ABLATION;  Surgeon: Constance Haw, MD;  Location: [place] [facility];  Service: Cardiovascular;  Laterality: N/A;  . CARDIAC CATHETERIZATION  10/16/2009   nonischemic cardiomyopathy  . CARDIOVERSION N/A 03/25/2015   Procedure: CARDIOVERSION;  Surgeon: Pixie Casino, MD;  Location: [facility];  Service: Cardiovascular;  Laterality: N/A;  . CARDIOVERSION N/A  06/17/2017   Procedure: CARDIOVERSION;  Surgeon: Sanda Klein, MD;  Location: [facility];  Service: Cardiovascular;  Laterality: N/A;  . CARDIOVERSION N/A 07/08/2017   Procedure: CARDIOVERSION;  Surgeon: Fay Records, MD;  Location[facility];  Service: Cardiovascular;  Laterality: N/A;  . CARDIOVERSION N/A 02/27/2019   Procedure: CARDIOVERSION;  Surgeon: Sanda Klein, MD;  Location: [facility]OSCOPY;  Service: Cardiovascular;  Laterality: N/A;  . CARDIOVERSION N/A 08/16/2019   Procedure: CARDIOVERSION;  Surgeon: Sanda Klein, MD;  Location: [facility]OS[facility];  Service: Cardiovascular;  Laterality: N/A;  . METATARSAL OSTEOTOMY Bilateral ~ 1980   removed part of 5th metatarsal to corect curvature of toes   . RIGHT HEART CATH N/A 05/16/2019   Procedure: RIGHT HEART CATH;  Surgeon: Jolaine Artist, MD;  Location: [place] [facility];  Service: Cardiovascular;  Laterality: N/A;  . TEE WITHOUT CARDIOVERSION  05/16/2019   Procedure: Transesophageal Echocardiogram (Tee);  Surgeon: Constance Haw, MD;  Location: [place] [facility];  Service: Cardiovascular;;  . TEE WITHOUT CARDIOVERSION N/A 03/07/2020   Procedure: TRANSESOPHAGEAL ECHOCARDIOGRAM (TEE);  Surgeon: Freada Bergeron, MD;  Location: [facility];  Service: Cardiovascular;  Laterality: N/A;     Current Meds  Medication Sig  . albuterol (VENTOLIN HFA) 108 (90 Base) MCG/ACT inhaler Inhale 2 puffs into the lungs  every 6 (six) hours as needed for wheezing or shortness of breath.  . allopurinol (ZYLOPRIM) 300 MG tablet Take 1 tablet by mouth once daily (Patient taking differently: Take 300 mg by mouth daily. )  . Blood Glucose Monitoring Suppl (ONETOUCH VERIO) w/Device KIT Use to check blood sugar TID as directed.  Dx Code: E11.9  . calcium carbonate (TUMS - DOSED IN MG ELEMENTAL CALCIUM) 500 MG chewable tablet Chew 500 mg by mouth daily as needed for indigestion or heartburn.  . cetirizine (ZYRTEC) 10 MG tablet Take 1 tablet (10 mg  total) by mouth daily.  . Clobetasol Prop Emollient Base (CLOBETASOL PROPIONATE E) 0.05 % emollient cream Apply 1 application topically 2 (two) times daily as needed (insect bite).  . colchicine (COLCRYS) 0.6 MG tablet Take one tablet for 4-6 days as needed for gout flare up.  . dapagliflozin propanediol (FARXIGA) 10 MG TABS tablet Take 1 tablet (10 mg total) by mouth daily before breakfast.  . ELIQUIS 5 MG TABS tablet Take 1 tablet by mouth twice daily (Patient taking differently: Take 5 mg by mouth 2 (two) times daily. )  . fish oil-omega-3 fatty acids 1000 MG capsule Take 1 g by mouth daily.   . furosemide (LASIX) 80 MG tablet Take 1 tablet (80 mg total) by mouth daily. As needed patient may take an additional 40 MG tablet as direct per Alleviate Research study PRN plan.  [name] glucose blood test strip (Verio) Use as instructed Check blood sugars TID DxE11.9  . glyBURIDE (DIABETA) 5 MG tablet Take 2.5 mg by mouth daily as needed (when eating a big meal).   . hydrALAZINE (APRESOLINE) 25 MG tablet Take 1 tablet (25 mg total) by mouth 3 (three) times daily.  [name] HYDROcodone-acetaminophen (NORCO) 10-325 MG tablet Take 1 tablet by mouth every 8 (eight) hours as needed for moderate pain or severe pain.  . hydrocortisone 2.5 % ointment Apply topically 2 (two) times daily as needed. (Patient taking differently: Apply 1 application topically 2 (two) times daily as needed (irritation). )  . isosorbide mononitrate (IMDUR) 30 MG 24 hr tablet Take 1 tablet (30 mg total) by mouth daily.  [name] LORazepam (ATIVAN) 1 MG tablet TAKE 1 TABLET BY MOUTH EVERY 8 HOURS AS NEEDED FOR ANXIETY  . magnesium oxide (MAG-OX) 400 MG tablet Take 1 tablet (400 mg total) by mouth 2 (two) times daily.  . metoprolol succinate (TOPROL-XL) 50 MG 24 hr tablet Take 1 tablet (50 mg total) by mouth 2 (two) times daily. Take with or immediately following a meal.  . Multiple Vitamin (MULTIVITAMIN WITH MINERALS) TABS tablet Take 1 tablet by mouth  daily.   . naproxen (NAPROSYN) 375 MG tablet Take 375 mg by mouth 2 (two) times daily as needed (knee pain.).   [name] Polyethyl Glycol-Propyl Glycol (SYSTANE OP) Place 1 drop into both eyes daily as needed (dry eyes).  . potassium chloride SA (KLOR-CON) 20 MEQ tablet Take 1 tablet (20 mEq total) by mouth as directed. 1 tablet daily 20 mEq by mouth as directed per alleviate research protocol. (Patient taking differently: Take 20 mEq by mouth daily. )  . pravastatin (PRAVACHOL) 40 MG tablet Take 1 tablet (40 mg total) by mouth daily.  . tamsulosin (FLOMAX) 0.4 MG CAPS capsule Take 1 capsule by mouth once daily (Patient taking differently: Take 0.4 mg by mouth daily. )  . temazepam (RESTORIL) 30 MG capsule Take 1 capsule by mouth at bedtime     Allergies:   Sulfonamide derivatives  and Zolpidem   Social History   Tobacco Use  . Smoking status: Former Smoker    Packs/day: 1.00    Years: 10.00    Pack years: 10.00    Quit date: 06/07/1980    Years since quitting: 39.8  . Smokeless tobacco: Never Used  Vaping Use  . Vaping Use: Never used  Substance Use Topics  . Alcohol use: No  . Drug use: No     Family Hx: The patient's family history includes Aneurysm in his sister; Cancer in his father; Clotting disorder in his mother; Diabetes in his mother and sister; Heart attack in his mother; Heart disease in his mother; Hyperlipidemia in his mother; Hypertension in his mother and sister; Leukemia in his maternal grandmother; Lung disease in his father; Other in his sister; Seizures in his sister; Stroke in his mother.  ROS:   Please see the history of present illness.     All other systems reviewed and are negative.   Prior CV studies:   The following studies were reviewed today:  TEE 03/07/2020 1. Left ventricular ejection fraction, by estimation, is 35 to 40%. The  left ventricle has moderately decreased function. The left ventricle  demonstrates global hypokinesis.  2. Iatrogenic PFO  visualized from prior ablation procedure.  3. Right ventricular systolic function is mildly reduced. The right  ventricular size is mildly enlarged.  4. Left atrial size was severely dilated. No left atrial/left atrial  appendage thrombus was detected.  5. The mitral valve is normal in structure. Mild mitral valve  regurgitation.  6. The aortic valve is tricuspid. There is mild calcification of the  aortic valve. There is mild thickening of the aortic valve. Aortic valve  regurgitation is mild. Mild aortic valve sclerosis is present, with no  evidence of aortic valve stenosis.  7. Aortic dilatation noted. There is mild dilatation of the ascending  aorta, measuring 37 mm. There is mild dilatation of the aortic root,  measuring 41 mm. There is Moderate (Grade III) plaque.   Labs/Other Tests and Data Reviewed:    EKG:  An ECG dated 03/27/2020 was personally reviewed today and demonstrated:  Atrial flutter  Recent Labs: 05/29/2019: Magnesium 2.1 06/11/2019: TSH 1.91 07/23/2019: ALT 23 08/02/2019: Pro B Natriuretic peptide (BNP) 446.0 11/26/2019: B Natriuretic Peptide 349.6 02/25/2020: BUN 29; Creatinine, Ser 2.04; Hemoglobin 13.3; Platelets 183; Potassium 3.9; Sodium 139   Recent Lipid Panel Lab Results  Component Value Date/Time   CHOL 134 06/11/2019 11:06 AM   TRIG 83.0 06/11/2019 11:06 AM   HDL 41.50 06/11/2019 11:06 AM   CHOLHDL 3 06/11/2019 11:06 AM   LDLCALC 76 06/11/2019 11:06 AM   LDLDIRECT 84.0 05/02/2018 03:23 PM    Wt Readings from Last 3 Encounters:  04/03/20 186 lb 3.2 oz (84.5 kg)  03/27/20 182 lb (82.6 kg)  03/10/20 182 lb (82.6 kg)     Risk Assessment/Calculations:     CHA2DS2-VASc Score = 3  This indicates a 3.2% annual risk of stroke. The patient's score is based upon: CHF History: 1 HTN History: 1 Diabetes History: 0 Stroke History: 0 Vascular Disease History: 0 Age Score: 1 Gender Score: 0     Objective:    Vital Signs:  BP 105/72   Pulse  62   Temp (!) 96.8 F (36 C)   Ht $R'5\' 11"'IR$  (1.803 m)   Wt 186 lb 3.2 oz (84.5 kg)   SpO2 98%   BMI 25.97 kg/m    VITAL SIGNS:  reviewed  ASSESSMENT & PLAN:    1. Paroxysmal atrial fibrillation: Recently underwent redo EP study with ablation, continue on Eliquis and Toprol-XL  2. Paroxysmal atrial flutter: Since A. fib ablation, last EKG obtained on 03/27/2020 suggested he went into atrial flutter.  However patient is asymptomatic and appears to be rate controlled based on the EKG.  We will continue on the current therapy.  Unable to uptitrate rate control therapy due to borderline blood pressure.  He will need a repeat EKG next Monday to see if atrial flutter is persistent or paroxysmal.  3. Hypertension: Blood pressure stable  4. CKD stage III: Stable on last lab work, baseline creatinine 2.0.  5. Tachycardia mediated cardiomyopathy: EF remains borderline low at 35 to 40% on last TEE.  Continue on Toprol-XL, Farxiga  6. CAD: He was prescribed Colcrys, however he says his pharmacy try to prescribe him another drug, I wonder if this is the generic version of Colcrys.  COVID-19 Education: The signs and symptoms of COVID-19 were discussed with the patient and how to seek care for testing (follow up with PCP or arrange E-visit).  The importance of social distancing was discussed today.  Time:   Today, I have spent 11 minutes with the patient with telehealth technology discussing the above problems.     Medication Adjustments/Labs and Tests Ordered: Current medicines are reviewed at length with the patient today.  Concerns regarding medicines are outlined above.   Tests Ordered: No orders of the defined types were placed in this encounter.   Medication Changes: No orders of the defined types were placed in this encounter.   Follow Up:  In Person in 6 month(s)  Signed, Almyra Deforest, [place]  04/05/2020 7:49 PM    [facility]

## 2020-04-03 NOTE — Telephone Encounter (Signed)
  Patient Consent for Virtual Visit         [name] has provided verbal consent on 04/03/2020 for a virtual visit (video or telephone).   CONSENT FOR VIRTUAL VISIT FOR:  [name]  By participating in this virtual visit I agree to the following:  I hereby voluntarily request, consent and authorize [facility] and its employed or contracted physicians, physician assistants, nurse practitioners or other licensed health care professionals (the Practitioner), to provide me with telemedicine health care services (the "Services") as deemed necessary by the treating Practitioner. I acknowledge and consent to receive the Services by the Practitioner via telemedicine. I understand that the telemedicine visit will involve communicating with the Practitioner through live audiovisual communication technology and the disclosure of certain medical information by electronic transmission. I acknowledge that I have been given the opportunity to request an in-person assessment or other available alternative prior to the telemedicine visit and am voluntarily participating in the telemedicine visit.  I understand that I have the right to withhold or withdraw my consent to the use of telemedicine in the course of my care at any time, without affecting my right to future care or treatment, and that the Practitioner or I may terminate the telemedicine visit at any time. I understand that I have the right to inspect all information obtained and/or recorded in the course of the telemedicine visit and may receive copies of available information for a reasonable fee.  I understand that some of the potential risks of receiving the Services via telemedicine include:  [name] Delay or interruption in medical evaluation due to technological equipment failure or disruption; . Information transmitted may not be sufficient (e.g. poor resolution of images) to allow for appropriate medical decision making by the  Practitioner; and/or  . In rare instances, security protocols could fail, causing a breach of personal health information.  Furthermore, I acknowledge that it is my responsibility to provide information about my medical history, conditions and care that is complete and accurate to the best of my ability. I acknowledge that Practitioner's advice, recommendations, and/or decision may be based on factors not within their control, such as incomplete or inaccurate data provided by me or distortions of diagnostic images or specimens that may result from electronic transmissions. I understand that the practice of medicine is not an exact science and that Practitioner makes no warranties or guarantees regarding treatment outcomes. I acknowledge that a copy of this consent can be made available to me via my patient portal ([place]), or I can request a printed copy by calling the office of [facility].    I understand that my insurance will be billed for this visit.   I have read or had this consent read to me. . I understand the contents of this consent, which adequately explains the benefits and risks of the Services being provided via telemedicine.  . I have been provided ample opportunity to ask questions regarding this consent and the Services and have had my questions answered to my satisfaction. . I give my informed consent for the services to be provided through the use of telemedicine in my medical care

## 2020-04-04 DIAGNOSIS — Z006 Encounter for examination for normal comparison and control in clinical research program: Secondary | ICD-10-CM

## 2020-04-04 NOTE — Research (Signed)
Alleviate Research Study  [facility] transmission  Patient felling well, no concerns at this time. Will continue to monitor.

## 2020-04-07 ENCOUNTER — Encounter (HOSPITAL_COMMUNITY): Payer: Self-pay | Admitting: Physician Assistant

## 2020-04-07 ENCOUNTER — Other Ambulatory Visit: Payer: Self-pay

## 2020-04-07 ENCOUNTER — Ambulatory Visit (HOSPITAL_COMMUNITY)
Admission: RE | Admit: 2020-04-07 | Discharge: 2020-04-07 | Disposition: A | Discharge status: Home / Self Care | Payer: Medicare HMO | Source: Ambulatory Visit | Attending: Physician Assistant | Admitting: Physician Assistant

## 2020-04-07 VITALS — BP 110/70 | HR 97 | Ht 71.0 in | Wt 190.8 lb

## 2020-04-07 DIAGNOSIS — Z882 Allergy status to sulfonamides status: Secondary | ICD-10-CM | POA: Insufficient documentation

## 2020-04-07 DIAGNOSIS — I484 Atypical atrial flutter: Secondary | ICD-10-CM

## 2020-04-07 DIAGNOSIS — I4819 Other persistent atrial fibrillation: Secondary | ICD-10-CM | POA: Diagnosis not present

## 2020-04-07 DIAGNOSIS — Z87891 Personal history of nicotine dependence: Secondary | ICD-10-CM | POA: Insufficient documentation

## 2020-04-07 DIAGNOSIS — I5022 Chronic systolic (congestive) heart failure: Secondary | ICD-10-CM | POA: Insufficient documentation

## 2020-04-07 DIAGNOSIS — I130 Hypertensive heart and chronic kidney disease with heart failure and stage 1 through stage 4 chronic kidney disease, or unspecified chronic kidney disease: Secondary | ICD-10-CM | POA: Diagnosis not present

## 2020-04-07 DIAGNOSIS — D6869 Other thrombophilia: Secondary | ICD-10-CM | POA: Diagnosis not present

## 2020-04-07 DIAGNOSIS — Z7901 Long term (current) use of anticoagulants: Secondary | ICD-10-CM | POA: Diagnosis not present

## 2020-04-07 DIAGNOSIS — I4892 Unspecified atrial flutter: Secondary | ICD-10-CM | POA: Diagnosis not present

## 2020-04-07 DIAGNOSIS — Z8249 Family history of ischemic heart disease and other diseases of the circulatory system: Secondary | ICD-10-CM | POA: Insufficient documentation

## 2020-04-07 DIAGNOSIS — Z888 Allergy status to other drugs, medicaments and biological substances status: Secondary | ICD-10-CM | POA: Diagnosis not present

## 2020-04-07 DIAGNOSIS — Z79899 Other long term (current) drug therapy: Secondary | ICD-10-CM | POA: Diagnosis not present

## 2020-04-07 DIAGNOSIS — G4733 Obstructive sleep apnea (adult) (pediatric): Secondary | ICD-10-CM | POA: Insufficient documentation

## 2020-04-07 DIAGNOSIS — N1832 Chronic kidney disease, stage 3b: Secondary | ICD-10-CM | POA: Insufficient documentation

## 2020-04-07 NOTE — Progress Notes (Signed)
Primary Care Physician: [name], MD Primary Cardiologist: Dr Sallyanne Kuster Primary Electrophysiologist: Dr Curt Bears [facility]: Dr Haroldine Laws Referring Physician: Dr Alanson Aly is a 69 y.o. 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 Admitted 05/16/19 for PVI with successful procedure. Post procedure he developed cardiogenic shock and respiratory failure. CCM consulted for intubation. Advanced HF team consulted. As he improved he was extubated. Treated with pressors and gradually weaned off. Was discharged home on 05/29/19. During his admission his dofetilide was changed to amiodarone 2/2 renal insufficiency. He is on Eliquis for a CHADS2VASC score of 3. The research team received an alert for increased afib burden on ILR 01/01/20. Patient very surprised as he had no symptoms of afib.   On follow up today, patient is s/p repeat afib ablation 03/10/20 with Dr Curt Bears. Patient reports that he feels well since his procedure and denies CP, swallowing, or groin issues. He appears to be back in atrial flutter persistently since 10/8. He is unaware of his arrhythmia. He denies any bleeding issues on anticoagulation.   Today, he denies symptoms of palpitations, chest pain, shortness of breath, orthopnea, PND, lower extremity edema, dizziness, presyncope, syncope, bleeding, or neurologic sequela. The patient is tolerating medications without difficulties and is otherwise without complaint today.    Atrial Fibrillation Risk Factors:  he does have symptoms or diagnosis of sleep apnea. he is compliant with CPAP therapy.   he has a BMI of Body mass index is 26.61 kg/m.[name] Filed Weights   04/07/20 0907  Weight: 86.5 kg    Family History  Problem Relation Age of Onset  . Clotting disorder Mother   . Heart disease Mother        s/p MI  . Heart  attack Mother   . Hypertension Mother   . Diabetes Mother   . Hyperlipidemia Mother   . Stroke Mother   . Cancer Father        ? lung  . Lung disease Father        smoker  . Diabetes Sister   . Hypertension Sister        smoker  . Leukemia Maternal Grandmother        ?  [name] Aneurysm Sister        brain  . Other Sister        clipped  . Seizures Sister        d/o w/aneurysm/ smoker     Atrial Fibrillation Management history:  Previous antiarrhythmic drugs: dofetilide, amiodarone Previous cardioversions: 2016, 2019 x2, 02/2019 Previous ablations: 05/16/19, 03/10/20 CHADS2VASC score: 3 Anticoagulation history: Eliquis   Past Medical History:  Diagnosis Date  . AORTIC STENOSIS, MODERATE 12/05/2009  . Atrial fibrillation ([place])   . Atrial fibrillation with RVR ([place]) 07/05/2017  . Atrial flutter ([place]) 12/05/2009  . Atypical chest pain 11/08/2011  . BENIGN PROSTATIC HYPERTROPHY, HX OF 12/05/2009  . CHEST PAIN-UNSPECIFIED 11/11/2009  . CHF 12/05/2009  . Chronic renal insufficiency 12/16/2016  . Debility 04/18/2012  . Diarrhea 12/16/2016  . DYSPNEA 12/19/2009  . Edema 04/18/2012  . ERECTILE DYSFUNCTION 01/25/2007  . FATIGUE, CHRONIC 11/11/2009  . Gout 07/10/2012  . Heart murmur   . History of kidney stones    "they passed" (07/05/2017)  . Hyperkalemia 04/18/2012  . Hyperlipidemia   . Hypertension   . HYPERTENSION  01/25/2007  . Hypoxia 05/05/2011  . Insomnia 05/13/2016  . INSOMNIA, HX OF 01/25/2007  . Knee pain, bilateral 12/28/2010  . Knee pain, right 12/28/2010  . NEUROMA 01/05/2010  . OBESITY NOS 01/25/2007  . OSA on CPAP 04/06/2010  . PERIPHERAL NEUROPATHY 01/05/2010  . PULMONARY FUNCTION TESTS, ABNORMAL 02/02/2010  . Superficial thrombophlebitis of left leg 09/07/2011  . TESTICULAR HYPOFUNCTION 01/05/2010  . 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 Toe pain, right 07/10/2012  . Type II diabetes mellitus ([place]) 12/05/2009  . UNSPECIFIED ANEMIA 01/05/2010   Past Surgical History:  Procedure Laterality Date  .  A-FLUTTER ABLATION N/A 05/16/2019   Procedure: A-FLUTTER ABLATION;  Surgeon: Constance Haw, MD;  Location: [place] [facility];  Service: Cardiovascular;  Laterality: N/A;  . ATRIAL FIBRILLATION ABLATION N/A 05/16/2019   Procedure: ATRIAL FIBRILLATION ABLATION;  Surgeon: Constance Haw, MD;  Location: [place] [facility];  Service: Cardiovascular;  Laterality: N/A;  . ATRIAL FIBRILLATION ABLATION N/A 03/10/2020   Procedure: ATRIAL FIBRILLATION ABLATION;  Surgeon: Constance Haw, MD;  Location: [place] [facility];  Service: Cardiovascular;  Laterality: N/A;  . CARDIAC CATHETERIZATION  10/16/2009   nonischemic cardiomyopathy  . CARDIOVERSION N/A 03/25/2015   Procedure: CARDIOVERSION;  Surgeon: Pixie Casino, MD;  Location: [facility] [place];  Service: Cardiovascular;  Laterality: N/A;  . CARDIOVERSION N/A 06/17/2017   Procedure: CARDIOVERSION;  Surgeon: Sanda Klein, MD;  Location: [facility];  Service: Cardiovascular;  Laterality: N/A;  . CARDIOVERSION N/A 07/08/2017   Procedure: CARDIOVERSION;  Surgeon: Fay Records, MD;  Location: [facility];  Service: Cardiovascular;  Laterality: N/A;  . CARDIOVERSION N/A 02/27/2019   Procedure: CARDIOVERSION;  Surgeon: Sanda Klein, MD;  Location: [facility];  Service: Cardiovascular;  Laterality: N/A;  . CARDIOVERSION N/A 08/16/2019   Procedure: CARDIOVERSION;  Surgeon: Sanda Klein, MD;  Location: [facility];  Service: Cardiovascular;  Laterality: N/A;  . METATARSAL OSTEOTOMY Bilateral ~ 1980   removed part of 5th metatarsal to corect curvature of toes   . RIGHT HEART CATH N/A 05/16/2019   Procedure: RIGHT HEART CATH;  Surgeon: Jolaine Artist, MD;  Location: [place] [facility];  Service: Cardiovascular;  Laterality: N/A;  . TEE WITHOUT CARDIOVERSION  05/16/2019   Procedure: Transesophageal Echocardiogram (Tee);  Surgeon: Constance Haw, MD;  Location: [place] [facility];  Service: Cardiovascular;;  . TEE WITHOUT  CARDIOVERSION N/A 03/07/2020   Procedure: TRANSESOPHAGEAL ECHOCARDIOGRAM (TEE);  Surgeon: Freada Bergeron, MD;  Location: [facility] ENDOSCOPY;  Service: Cardiovascular;  Laterality: N/A;    Current Outpatient Medications  Medication Sig Dispense Refill  . albuterol (VENTOLIN HFA) 108 (90 Base) MCG/ACT inhaler Inhale 2 puffs into the lungs every 6 (six) hours as needed for wheezing or shortness of breath. 1 Inhaler 6  . allopurinol (ZYLOPRIM) 300 MG tablet Take 1 tablet by mouth once daily 90 tablet 0  . Blood Glucose Monitoring Suppl (ONETOUCH VERIO) w/Device KIT Use to check blood sugar TID as directed.  Dx Code: E11.9 1 kit 0  . calcium carbonate (TUMS - DOSED IN MG ELEMENTAL CALCIUM) 500 MG chewable tablet Chew 500 mg by mouth daily as needed for indigestion or heartburn.    . cetirizine (ZYRTEC) 10 MG tablet Take 1 tablet (10 mg total) by mouth daily. 30 tablet 11  . Clobetasol Prop Emollient Base (CLOBETASOL PROPIONATE E) 0.05 % emollient cream Apply 1 application topically 2 (two) times daily as needed (insect bite). 60 g 1  . colchicine (COLCRYS) 0.6 MG tablet  Take one tablet for 4-6 days as needed for gout flare up. 15 tablet 3  . dapagliflozin propanediol (FARXIGA) 10 MG TABS tablet Take 1 tablet (10 mg total) by mouth daily before breakfast. 30 tablet 6  . ELIQUIS 5 MG TABS tablet Take 1 tablet by mouth twice daily 180 tablet 1  . fish oil-omega-3 fatty acids 1000 MG capsule Take 1 g by mouth daily.     . furosemide (LASIX) 80 MG tablet Take 1 tablet (80 mg total) by mouth daily. As needed patient may take an additional 40 MG tablet as direct per Alleviate Research study PRN plan. 30 tablet 6  . glucose blood test strip (Verio) Use as instructed Check blood sugars TID DxE11.9 100 each 12  . glyBURIDE (DIABETA) 5 MG tablet Take 2.5 mg by mouth daily as needed (when eating a big meal).     . hydrALAZINE (APRESOLINE) 25 MG tablet Take 1 tablet (25 mg total) by mouth 3 (three) times daily. 270  tablet 2  . HYDROcodone-acetaminophen (NORCO) 10-325 MG tablet Take 1 tablet by mouth every 8 (eight) hours as needed for moderate pain or severe pain. 90 tablet 0  . hydrocortisone 2.5 % ointment Apply topically 2 (two) times daily as needed. 30 g 1  . isosorbide mononitrate (IMDUR) 30 MG 24 hr tablet Take 1 tablet (30 mg total) by mouth daily. 30 tablet 11  . LORazepam (ATIVAN) 1 MG tablet TAKE 1 TABLET BY MOUTH EVERY 8 HOURS AS NEEDED FOR ANXIETY 30 tablet 0  . magnesium oxide (MAG-OX) 400 MG tablet Take 1 tablet (400 mg total) by mouth 2 (two) times daily. 30 tablet 11  . metoprolol succinate (TOPROL-XL) 50 MG 24 hr tablet Take 1 tablet (50 mg total) by mouth 2 (two) times daily. Take with or immediately following a meal. 180 tablet 3  . Multiple Vitamin (MULTIVITAMIN WITH MINERALS) TABS tablet Take 1 tablet by mouth daily.     . naproxen (NAPROSYN) 375 MG tablet Take 375 mg by mouth 2 (two) times daily as needed (knee pain.).     [name] Polyethyl Glycol-Propyl Glycol (SYSTANE OP) Place 1 drop into both eyes daily as needed (dry eyes).    . potassium chloride SA (KLOR-CON) 20 MEQ tablet Take 1 tablet (20 mEq total) by mouth as directed. 1 tablet daily 20 mEq by mouth as directed per alleviate research protocol. 180 tablet 1  . pravastatin (PRAVACHOL) 40 MG tablet Take 1 tablet (40 mg total) by mouth daily. 90 tablet 1  . tamsulosin (FLOMAX) 0.4 MG CAPS capsule Take 1 capsule by mouth once daily 30 capsule 0  . temazepam (RESTORIL) 30 MG capsule Take 1 capsule by mouth at bedtime 30 capsule 0   No current facility-administered medications for this encounter.    Allergies  Allergen Reactions  . Sulfonamide Derivatives Other (See Comments)    Flu like symptoms   . Zolpidem Other (See Comments)    Excessive, prolonged sedation    Social History   Socioeconomic History  . Marital status: Married    Spouse name: Not on file  . Number of children: 4  . Years of education: 7  . Highest  education level: High school graduate  Occupational History  . Occupation: retired    Fish farm manager: [employer]  Tobacco Use  . Smoking status: Former Smoker    Packs/day: 1.00    Years: 10.00    Pack years: 10.00    Quit date: 06/07/1980    Years  since quitting: 39.8  . Smokeless tobacco: Never Used  Vaping Use  . Vaping Use: Never used  Substance and Sexual Activity  . Alcohol use: No  . Drug use: No  . Sexual activity: Not Currently  Other Topics Concern  . Not on file  Social History Narrative   Lives with male partner in a one story home.  His son lives there off and on.  Retired from [employer].  Education: high school.  Right handed   Social Determinants of Health   Financial Resource Strain:   . Difficulty of Paying Living Expenses: Not on file  Food Insecurity:   . Worried About Charity fundraiser in the Last Year: Not on file  . Ran Out of Food in the Last Year: Not on file  Transportation Needs:   . Lack of Transportation (Medical): Not on file  . Lack of Transportation (Non-Medical): Not on file  Physical Activity:   . Days of Exercise per Week: Not on file  . Minutes of Exercise per Session: Not on file  Stress:   . Feeling of Stress : Not on file  Social Connections:   . Frequency of Communication with Friends and Family: Not on file  . Frequency of Social Gatherings with Friends and Family: Not on file  . Attends Religious Services: Not on file  . Active Member of Clubs or Organizations: Not on file  . Attends Archivist Meetings: Not on file  . Marital Status: Not on file  Intimate Partner Violence:   . Fear of Current or Ex-Partner: Not on file  . Emotionally Abused: Not on file  . Physically Abused: Not on file  . Sexually Abused: Not on file     ROS- All systems are reviewed and negative except as per the HPI above.  Physical Exam: Vitals:   04/07/20 0907  BP: 110/70  Pulse: 97  Weight: 86.5 kg  Height: _0  (1.803 m)    GEN-  The patient is well appearing, alert and oriented x 3 today.   HEENT-head normocephalic, atraumatic, sclera clear, conjunctiva pink, hearing intact, trachea midline. Lungs- Clear to ausculation bilaterally, normal work of breathing Heart- Regular rate and rhythm, no murmurs, rubs or gallops  GI- soft, NT, ND, + BS Extremities- no clubbing, cyanosis, or edema MS- no significant deformity or atrophy Skin- no rash or lesion Psych- euthymic mood, full affect Neuro- strength and sensation are intact   Wt Readings from Last 3 Encounters:  04/07/20 86.5 kg  04/03/20 84.5 kg  03/27/20 82.6 kg    EKG today demonstrates atypical atrial flutter with 3:1 block, QRS 98, QTc 462  Echo 05/28/19 demonstrated   1. Left ventricular ejection fraction, by visual estimation, is 40 to 45%. The left ventricle has moderately decreased function. There is mildly increased left ventricular hypertrophy.  2. Left ventricular diastolic parameters are indeterminate.  3. The left ventricle demonstrates global hypokinesis.  4. Global right ventricle has normal systolic function.The right ventricular size is normal. No increase in right ventricular wall thickness.  5. Left atrial size was normal.  6. Right atrial size was normal.  7. The mitral valve is normal in structure. Trivial mitral valve regurgitation. No evidence of mitral stenosis.  8. The tricuspid valve is normal in structure. Tricuspid valve regurgitation is trivial.  9. Aortic valve regurgitation is moderate. 10. The aortic valve is normal in structure. Aortic valve regurgitation is moderate. No evidence of aortic valve sclerosis or stenosis. 11.  Pulmonic regurgitation is moderate. 12. The pulmonic valve was normal in structure. Pulmonic valve regurgitation is moderate. 13. The inferior vena cava is normal in size with greater than 50% respiratory variability, suggesting right atrial pressure of 3 mmHg.  Epic records are reviewed at length  today  Assessment and Plan:  1. Persistent atrial fibrillation/atrial flutter S/p afib and atrial flutter ablation with Dr Curt Bears 35/6/86 complicated by cardiogenic shock.  Recall amiodarone stopped 2/2 tremors and dofetilide stopped 2/2 renal insufficiency.  S/p repeat afib ablation 03/10/20 Patient persistently in atypical atrial flutter now. We discussed therapeutic options including DCCV today. He is very hesitant about doing another procedure. We did discuss that although breakthrough episodes of afib/flutter are expected post ablation, we do want to keep him in SR as much as possible. We also discussed that the longer he is in afib, the more difficult it can be to restore SR. He voices understanding.  Continue Toprol 25 mg BID Continue Eliquis 5 mg BID  This patients CHA2DS2-VASc Score and unadjusted Ischemic Stroke Rate (% per year) is equal to 3.2 % stroke rate/year from a score of 3  Above score calculated as 1 point each if present [CHF, HTN, DM, Vascular=MI/PAD/Aortic Plaque, Age if 65-74, or Male] Above score calculated as 2 points each if present [Age > 75, or Stroke/TIA/TE]  2. Obstructive sleep apnea Patient reports compliance with CPAP therapy.  3. Chronic systolic CHF EF improved to 35-40% No signs or symptoms of fluid overload today.   4. HTN Stable, no changes today.   Follow up in the AF clinic in 2 weeks to revisit DCCV.    [facility] [address] [place], [place] [address] [phone] 04/07/2020 9:14 AM

## 2020-04-11 ENCOUNTER — Telehealth: Payer: Self-pay | Admitting: *Deleted

## 2020-04-11 NOTE — Telephone Encounter (Signed)
I received a BOX message from Alleviate-HF study, [place] Sizer RN, that allerted [place] that [name] still has a high risk report.   I called the patient to see how he is doing today, nothing has changed since he was seen in the A.fib clinic on 04-07-2020. He states that he is feeling good and is "fairly active". His weight is stable; has no SHOB or swelling, and is still taking his Lasix 80mg  as directed. He reports that when he takes his pulse, it is consistently between 97-100 bpm. Patient said that he did get a phone call from [place] this morning and told her the same information. I reminded him of his appt with the A.fib clinic on 04-21-20, but he knows to call us sooner if he has any issues.

## 2020-04-14 DIAGNOSIS — Z006 Encounter for examination for normal comparison and control in clinical research program: Secondary | ICD-10-CM

## 2020-04-14 DIAGNOSIS — G4733 Obstructive sleep apnea (adult) (pediatric): Secondary | ICD-10-CM | POA: Diagnosis not present

## 2020-04-14 NOTE — Research (Addendum)
Alleviate Research Study  Carelink Transmission         Notes that fluid index has returned to baseline and patient activity has improved.  The respiratory rate remains below threshold.  I think the device is not consistently recognizing the presence of atrial flutter due to the irregularity of the AV block.  Suspect risk score is affected by this but will soon return to normal.

## 2020-04-15 ENCOUNTER — Other Ambulatory Visit: Payer: Self-pay | Admitting: Family Medicine

## 2020-04-15 NOTE — Telephone Encounter (Signed)
Requesting: temazepam 30mg  Contract: 10/21/2017 UDS: 08/03/2018 Last Visit: 12/24/19 Next Visit: 05/22/2020 Last Refill: 03/17/2020 #30 and 0RF  Please Advise

## 2020-04-18 ENCOUNTER — Other Ambulatory Visit: Payer: Self-pay

## 2020-04-18 ENCOUNTER — Encounter: Payer: Self-pay | Admitting: Internal Medicine

## 2020-04-18 ENCOUNTER — Ambulatory Visit (INDEPENDENT_AMBULATORY_CARE_PROVIDER_SITE_OTHER): Payer: Medicare HMO | Admitting: Internal Medicine

## 2020-04-18 VITALS — BP 120/72 | HR 100 | Ht 71.0 in | Wt 192.4 lb

## 2020-04-18 DIAGNOSIS — E669 Obesity, unspecified: Secondary | ICD-10-CM

## 2020-04-18 DIAGNOSIS — E782 Mixed hyperlipidemia: Secondary | ICD-10-CM

## 2020-04-18 DIAGNOSIS — E1169 Type 2 diabetes mellitus with other specified complication: Secondary | ICD-10-CM | POA: Diagnosis not present

## 2020-04-18 DIAGNOSIS — E663 Overweight: Secondary | ICD-10-CM

## 2020-04-18 LAB — POCT GLYCOSYLATED HEMOGLOBIN (HGB A1C): Hemoglobin A1C: 8.1 % — AB (ref 4.0–5.6)

## 2020-04-18 MED ORDER — OZEMPIC (0.25 OR 0.5 MG/DOSE) 2 MG/1.5ML ~~LOC~~ SOPN: 0.5000 mg | PEN_INJECTOR | SUBCUTANEOUS | 3 refills | Status: DC

## 2020-04-18 NOTE — Patient Instructions (Addendum)
Please continue: - Farxiga 10 mg before b'fast  You may take glyburide half a tablet before a meal before a meal with fried food or dessert.  Please start Ozempic 0.25 mg weekly in a.m. (for example on Sunday morning) x 4 weeks, then increase to 0.5 mg weekly in a.m. if no nausea or hypoglycemia.  Check sugars once a day, rotating check times.  Please return in 3 months with your sugar log.

## 2020-04-18 NOTE — Progress Notes (Signed)
Patient ID: [name], male   DOB: [birth year], 69 y.o.   MRN: [id number]  This visit occurred during the SARS-CoV-2 public health emergency.  Safety protocols were in place, including screening questions prior to the visit, additional usage of staff PPE, and extensive cleaning of exam room while observing appropriate contact time as indicated for disinfecting solutions.   HPI: [name] is a 69 y.o.-year-old male, initially referred by his cardiologist, Dr. Sallyanne Kuster, returning for follow-up DM2, dx in 2011, non-insulin-dependent, uncontrolled, with complications (CHF, CKD, ED).  Last visit 4 months ago.  Before last visit, he joined again [facility] and planned to restart exercise. He could not start as he had a cardiac ablation. He did stay active since last visit..  Reviewed HbA1c levels: Lab Results  Component Value Date   HGBA1C 6.6 (A) 12/14/2019   HGBA1C 5.3 09/14/2019   HGBA1C 6.4 06/11/2019   HGBA1C 5.8 02/06/2019   HGBA1C 5.7 08/03/2018   HGBA1C 7.0 (H) 05/02/2018   HGBA1C 6.7 (H) 01/24/2018   HGBA1C 6.2 10/21/2017   HGBA1C 7.2 (H) 03/18/2017   HGBA1C 7.0 (H) 11/15/2016   HGBA1C 7.5 (H) 08/20/2016   HGBA1C 6.8 (H) 05/21/2016   HGBA1C 6.7 (H) 02/18/2016   HGBA1C 6.4 11/17/2015   HGBA1C 6.4 07/15/2015   HGBA1C 6.6 (H) 04/14/2015   HGBA1C 6.1 01/07/2015   HGBA1C 6.8 (H) 09/13/2014   HGBA1C 6.8 (H) 06/13/2014   HGBA1C 6.6 (H) 01/09/2014   HGBA1C 6.4 (H) 10/08/2013   HGBA1C 7.0 (H) 07/11/2013   HGBA1C 6.6 (H) 01/31/2013   HGBA1C 5.9 (H) 10/31/2012   HGBA1C 6.8 (H) 06/22/2012   HGBA1C 6.7 (H) 03/08/2012   HGBA1C 6.6 (H) 11/04/2011   HGBA1C 7.3 (H) 07/22/2011   HGBA1C 8.3 (H) 03/24/2011   HGBA1C 7.1 (H) 12/14/2010   Pt is on a regimen of: - Farxiga 10 mg daily - started by Dr. Haroldine Laws - Glyburide 5 >> 2.5 mg after b'fast and 0-2.5 mg 2h after dinner >> 0.5 tab as needed before a large meal - took it 2-3x since last visit He was on Metformin in the past.  Pt  checks his sugars once a day: - am: 60-94 >> 130-140 >> 130, 142-199 - 2h after b'fast: n/c - before lunch: n/c - 2h after lunch: n/c >> 165 - before dinner: n/c >> 170 - 2h after dinner: n/c - bedtime: 68-155, 176, 376  >> 154, 157, 200 >> n/c - nighttime: n/c Lowest sugar was 60 >> 100 >> 130; he has hypoglycemia awareness in the fifties.  Highest sugar was 376 (cheese cake) >> 200 (pasta) >> 199.  Glucometer: One Touch verio IQ  Pt's meals are: - Breakfast: oatmeal + berries/apples + cinnamon, cereal, fruit - Lunch: light meal: ensure, fruit - Dinner: chicken or fish, ground [place] + veggies, occasionally fried foods (1x a mo) - Snacks: cranberry juice, grape juice, aple juice >> diluted by at least 50%.  -+ CKD-sees Dr. Juleen China, last BUN/creatinine:  Lab Results  Component Value Date   BUN 29 (H) 02/25/2020   BUN 34 (H) 11/26/2019   CREATININE 2.04 (H) 02/25/2020   CREATININE 2.16 (H) 11/26/2019   Lab Results  Component Value Date   GFRAA 38 (L) 02/25/2020   GFRAA 36 (L) 11/26/2019   GFRAA 39 (L) 11/12/2019   GFRAA 36 (L) 09/18/2019   GFRAA 37 (L) 08/30/2019   GFRAA 39 (L) 08/14/2019   GFRAA 45 (L) 07/11/2019   GFRAA 51 (L) 05/29/2019  GFRAA 58 (L) 05/28/2019   GFRAA 58 (L) 05/27/2019   He is not on ACE inhibitor/ARB.  -+ HL; last set of lipids: Lab Results  Component Value Date   CHOL 134 06/11/2019   HDL 41.50 06/11/2019   LDLCALC 76 06/11/2019   LDLDIRECT 84.0 05/02/2018   TRIG 83.0 06/11/2019   CHOLHDL 3 06/11/2019  On pravastatin 40, omega-3 fatty acids.  - last eye exam was in Spring 2020: Reportedly no DR, + glaucoma.  - no numbness and tingling in his feet.  Pt has FH of DM in mother, sister.  He is on Amiodarone, dose reduced from 400 to 200 mg before last visit.  No FH of MTC. No personal hx of pancreatitis.  ROS: Constitutional: no weight gain/no weight loss, no fatigue, no subjective hyperthermia, no subjective hypothermia Eyes: no  blurry vision, no xerophthalmia ENT: no sore throat, no nodules palpated in neck, no dysphagia, no odynophagia, no hoarseness Cardiovascular: no CP/no SOB/no palpitations/no leg swelling Respiratory: no cough/no SOB/no wheezing Gastrointestinal: no N/no V/no D/no C/no acid reflux Musculoskeletal: no muscle aches/no joint aches Skin: no rashes, no hair loss Neurological: + tremors/no numbness/no tingling/no dizziness  I reviewed pt's medications, allergies, PMH, social hx, family hx, and changes were documented in the history of present illness. Otherwise, unchanged from my initial visit note.  Past Medical History:  Diagnosis Date  . AORTIC STENOSIS, MODERATE 12/05/2009  . Atrial fibrillation ([place])   . Atrial fibrillation with RVR ([place]) 07/05/2017  . Atrial flutter ([place]) 12/05/2009  . Atypical chest pain 11/08/2011  . BENIGN PROSTATIC HYPERTROPHY, HX OF 12/05/2009  . CHEST PAIN-UNSPECIFIED 11/11/2009  . CHF 12/05/2009  . Chronic renal insufficiency 12/16/2016  . Debility 04/18/2012  . Diarrhea 12/16/2016  . DYSPNEA 12/19/2009  . Edema 04/18/2012  . ERECTILE DYSFUNCTION 01/25/2007  . FATIGUE, CHRONIC 11/11/2009  . Gout 07/10/2012  . Heart murmur   . History of kidney stones    "they passed" (07/05/2017)  . Hyperkalemia 04/18/2012  . Hyperlipidemia   . Hypertension   . HYPERTENSION 01/25/2007  . Hypoxia 05/05/2011  . Insomnia 05/13/2016  . INSOMNIA, HX OF 01/25/2007  . Knee pain, bilateral 12/28/2010  . Knee pain, right 12/28/2010  . NEUROMA 01/05/2010  . OBESITY NOS 01/25/2007  . OSA on CPAP 04/06/2010  . PERIPHERAL NEUROPATHY 01/05/2010  . PULMONARY FUNCTION TESTS, ABNORMAL 02/02/2010  . Superficial thrombophlebitis of left leg 09/07/2011  . TESTICULAR HYPOFUNCTION 01/05/2010  . 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 Toe pain, right 07/10/2012  . Type II diabetes mellitus ([place]) 12/05/2009  . UNSPECIFIED ANEMIA 01/05/2010   Past Surgical History:  Procedure Laterality Date  . A-FLUTTER ABLATION N/A 05/16/2019    Procedure: A-FLUTTER ABLATION;  Surgeon: Constance Haw, MD;  Location: [place] [facility];  Service: Cardiovascular;  Laterality: N/A;  . ATRIAL FIBRILLATION ABLATION N/A 05/16/2019   Procedure: ATRIAL FIBRILLATION ABLATION;  Surgeon: Constance Haw, MD;  Location: [place] [facility];  Service: Cardiovascular;  Laterality: N/A;  . ATRIAL FIBRILLATION ABLATION N/A 03/10/2020   Procedure: ATRIAL FIBRILLATION ABLATION;  Surgeon: Constance Haw, MD;  Location: [place] [facility];  Service: Cardiovascular;  Laterality: N/A;  . CARDIAC CATHETERIZATION  10/16/2009   nonischemic cardiomyopathy  . CARDIOVERSION N/A 03/25/2015   Procedure: CARDIOVERSION;  Surgeon: Pixie Casino, MD;  Location: [facility] ENDOSCOPY;  Service: Cardiovascular;  Laterality: N/A;  . CARDIOVERSION N/A 06/17/2017   Procedure: CARDIOVERSION;  Surgeon: Sanda Klein, MD;  Location: [facility] ENDOSCOPY;  Service:  Cardiovascular;  Laterality: N/A;  . CARDIOVERSION N/A 07/08/2017   Procedure: CARDIOVERSION;  Surgeon: Fay Records, MD;  Location: [facility] ENDOSCOPY;  Service: Cardiovascular;  Laterality: N/A;  . CARDIOVERSION N/A 02/27/2019   Procedure: CARDIOVERSION;  Surgeon: Sanda Klein, MD;  Location: [place] ENDOSCOPY;  Service: Cardiovascular;  Laterality: N/A;  . CARDIOVERSION N/A 08/16/2019   Procedure: CARDIOVERSION;  Surgeon: Sanda Klein, MD;  Location: [facility] ENDOSCOPY;  Service: Cardiovascular;  Laterality: N/A;  . METATARSAL OSTEOTOMY Bilateral ~ 1980   removed part of 5th metatarsal to corect curvature of toes   . RIGHT HEART CATH N/A 05/16/2019   Procedure: RIGHT HEART CATH;  Surgeon: Jolaine Artist, MD;  Location: [place] [facility];  Service: Cardiovascular;  Laterality: N/A;  . TEE WITHOUT CARDIOVERSION  05/16/2019   Procedure: Transesophageal Echocardiogram (Tee);  Surgeon: Constance Haw, MD;  Location: [place] [facility];  Service: Cardiovascular;;  . TEE WITHOUT CARDIOVERSION N/A 03/07/2020   Procedure:  TRANSESOPHAGEAL ECHOCARDIOGRAM (TEE);  Surgeon: Freada Bergeron, MD;  Location: [facility] ENDOSCOPY;  Service: Cardiovascular;  Laterality: N/A;   Social History   Socioeconomic History  . Marital status: Married    Spouse name: Not on file  . Number of children: 4  . Years of education: 41  . Highest education level: High school graduate  Occupational History  . Occupation: retired    Fish farm manager: [employer]  Tobacco Use  . Smoking status: Former Smoker    Packs/day: 1.00    Years: 10.00    Pack years: 10.00    Quit date: 06/07/1980    Years since quitting: 39.8  . Smokeless tobacco: Never Used  Vaping Use  . Vaping Use: Never used  Substance and Sexual Activity  . Alcohol use: No  . Drug use: No  . Sexual activity: Not Currently  Other Topics Concern  . Not on file  Social History Narrative   Lives with male partner in a one story home.  His son lives there off and on.  Retired from [employer].  Education: high school.  Right handed   Social Determinants of Health   Financial Resource Strain:   . Difficulty of Paying Living Expenses: Not on file  Food Insecurity:   . Worried About Charity fundraiser in the Last Year: Not on file  . Ran Out of Food in the Last Year: Not on file  Transportation Needs:   . Lack of Transportation (Medical): Not on file  . Lack of Transportation (Non-Medical): Not on file  Physical Activity:   . Days of Exercise per Week: Not on file  . Minutes of Exercise per Session: Not on file  Stress:   . Feeling of Stress : Not on file  Social Connections:   . Frequency of Communication with Friends and Family: Not on file  . Frequency of Social Gatherings with Friends and Family: Not on file  . Attends Religious Services: Not on file  . Active Member of Clubs or Organizations: Not on file  . Attends Archivist Meetings: Not on file  . Marital Status: Not on file  Intimate Partner Violence:   . Fear of Current or Ex-Partner: Not on file   . Emotionally Abused: Not on file  . Physically Abused: Not on file  . Sexually Abused: Not on file   Current Outpatient Medications on File Prior to Visit  Medication Sig Dispense Refill  . albuterol (VENTOLIN HFA) 108 (90 Base) MCG/ACT inhaler Inhale 2 puffs  into the lungs every 6 (six) hours as needed for wheezing or shortness of breath. 1 Inhaler 6  . allopurinol (ZYLOPRIM) 300 MG tablet Take 1 tablet by mouth once daily 90 tablet 0  . Blood Glucose Monitoring Suppl (ONETOUCH VERIO) w/Device KIT Use to check blood sugar TID as directed.  Dx Code: E11.9 1 kit 0  . calcium carbonate (TUMS - DOSED IN MG ELEMENTAL CALCIUM) 500 MG chewable tablet Chew 500 mg by mouth daily as needed for indigestion or heartburn.    . cetirizine (ZYRTEC) 10 MG tablet Take 1 tablet (10 mg total) by mouth daily. 30 tablet 11  . Clobetasol Prop Emollient Base (CLOBETASOL PROPIONATE E) 0.05 % emollient cream Apply 1 application topically 2 (two) times daily as needed (insect bite). 60 g 1  . colchicine (COLCRYS) 0.6 MG tablet Take one tablet for 4-6 days as needed for gout flare up. 15 tablet 3  . dapagliflozin propanediol (FARXIGA) 10 MG TABS tablet Take 1 tablet (10 mg total) by mouth daily before breakfast. 30 tablet 6  . ELIQUIS 5 MG TABS tablet Take 1 tablet by mouth twice daily 180 tablet 1  . fish oil-omega-3 fatty acids 1000 MG capsule Take 1 g by mouth daily.     . furosemide (LASIX) 80 MG tablet Take 1 tablet (80 mg total) by mouth daily. As needed patient may take an additional 40 MG tablet as direct per [employer] study PRN plan. 30 tablet 6  . glucose blood test strip (Verio) Use as instructed Check blood sugars TID DxE11.9 100 each 12  . glyBURIDE (DIABETA) 5 MG tablet Take 2.5 mg by mouth daily as needed (when eating a big meal).     . hydrALAZINE (APRESOLINE) 25 MG tablet Take 1 tablet (25 mg total) by mouth 3 (three) times daily. 270 tablet 2  . HYDROcodone-acetaminophen (NORCO) 10-325 MG  tablet Take 1 tablet by mouth every 8 (eight) hours as needed for moderate pain or severe pain. 90 tablet 0  . hydrocortisone 2.5 % ointment Apply topically 2 (two) times daily as needed. 30 g 1  . isosorbide mononitrate (IMDUR) 30 MG 24 hr tablet Take 1 tablet (30 mg total) by mouth daily. 30 tablet 11  . LORazepam (ATIVAN) 1 MG tablet TAKE 1 TABLET BY MOUTH EVERY 8 HOURS AS NEEDED FOR ANXIETY 30 tablet 0  . magnesium oxide (MAG-OX) 400 MG tablet Take 1 tablet (400 mg total) by mouth 2 (two) times daily. 30 tablet 11  . metoprolol succinate (TOPROL-XL) 50 MG 24 hr tablet Take 1 tablet (50 mg total) by mouth 2 (two) times daily. Take with or immediately following a meal. 180 tablet 3  . Multiple Vitamin (MULTIVITAMIN WITH MINERALS) TABS tablet Take 1 tablet by mouth daily.     . naproxen (NAPROSYN) 375 MG tablet Take 375 mg by mouth 2 (two) times daily as needed (knee pain.).     [name] Polyethyl Glycol-Propyl Glycol (SYSTANE OP) Place 1 drop into both eyes daily as needed (dry eyes).    . potassium chloride SA (KLOR-CON) 20 MEQ tablet Take 1 tablet (20 mEq total) by mouth as directed. 1 tablet daily 20 mEq by mouth as directed per alleviate research protocol. 180 tablet 1  . pravastatin (PRAVACHOL) 40 MG tablet Take 1 tablet (40 mg total) by mouth daily. 90 tablet 1  . tamsulosin (FLOMAX) 0.4 MG CAPS capsule Take 1 capsule by mouth once daily 30 capsule 0  . temazepam (RESTORIL) 30 MG capsule  Take 1 capsule by mouth at bedtime 30 capsule 1   No current facility-administered medications on file prior to visit.   Allergies  Allergen Reactions  . Sulfonamide Derivatives Other (See Comments)    Flu like symptoms   . Zolpidem Other (See Comments)    Excessive, prolonged sedation   Family History  Problem Relation Age of Onset  . Clotting disorder Mother   . Heart disease Mother        s/p MI  . Heart attack Mother   . Hypertension Mother   . Diabetes Mother   . Hyperlipidemia Mother   . Stroke  Mother   . Cancer Father        ? lung  . Lung disease Father        smoker  . Diabetes Sister   . Hypertension Sister        smoker  . Leukemia Maternal Grandmother        ?  [name] Aneurysm Sister        brain  . Other Sister        clipped  . Seizures Sister        d/o w/aneurysm/ smoker   PE: BP 120/72   Pulse 100   Ht _0  (1.803 m)   Wt 192 lb 6.4 oz (87.3 kg)   SpO2 98%   BMI 26.83 kg/m  Wt Readings from Last 3 Encounters:  04/18/20 192 lb 6.4 oz (87.3 kg)  04/07/20 190 lb 12.8 oz (86.5 kg)  04/03/20 186 lb 3.2 oz (84.5 kg)   Constitutional: overweight, in NAD Eyes: PERRLA, EOMI, no exophthalmos ENT: moist mucous membranes, no thyromegaly, no cervical lymphadenopathy Cardiovascular: RRR, No MRG Respiratory: CTA B Gastrointestinal: abdomen soft, NT, ND, BS+ Musculoskeletal: no deformities, strength intact in all 4 Skin: moist, warm, no rashes Neurological: no tremor with outstretched hands, DTR normal in all 4  ASSESSMENT: 1. DM2, non-insulin-dependent, uncontrolled, with long-term complications - s and d CHF - A fib - s/p cardioversion - 05/2019 >> went into cardiac shock - CKD - sees nephrology  2.  Overweight  3. HL  PLAN:  1. Patient with longstanding, previously uncontrolled, type 2 diabetes, with increased HbA1c after stopping glyburide. He continues on an SGLT2 inhibitor as started by Dr. Haroldine Laws. Latest HbA1c was still at goal, at 6.6%.  His sugars are higher in the morning at last visit, but still mostly at goal.  Sugars later in the day were usually at goal so we decided to continue to stay off glyburide at that time.  We discussed that if absolutely needed, he could take half a tablet of glyburide before (not after) a meal.  He only uses -At today's visit, sugars are much higher.  He is not sure why they increased.  I suggested a GLP-1 receptor agonist and he agrees to start 1.  Discussed about benefits and possible side effects.  Demonstrated  Ozempic pen use and how to adjust the doses.  We will start at a low dose and increase as tolerated.  We will continue Farxiga. - I suggested to:  Patient Instructions  Please continue: - Farxiga 10 mg before b'fast  You may take glyburide half a tablet before a meal before a meal with fried food or dessert.  Please start Ozempic 0.25 mg weekly in a.m. (for example on Sunday morning) x 4 weeks, then increase to 0.5 mg weekly in a.m. if no nausea or hypoglycemia.  Check sugars once a day,  rotating check times.  Please return in 3 months with your sugar log.   - we checked his HbA1c: 8.1% (higher) - advised to check sugars at different times of the day - 1x a day, rotating check times - advised for yearly eye exams >> he is not UTD - return to clinic in 3 months  2.  Overweight -Continue Wilder Glade which should also help with weight loss.  We will also try to add a GLP-1 receptor agonist which should help further with this. -Before last visit, he lost 14 pounds. -Last visit weight: 182 lbs. Now: 192-gained 10 pounds  3. HL -Reviewed latest lipid panel from 06/2019: LDL slightly above target, the rest of the fractions at goal: Lab Results  Component Value Date   CHOL 134 06/11/2019   HDL 41.50 06/11/2019   LDLCALC 76 06/11/2019   LDLDIRECT 84.0 05/02/2018   TRIG 83.0 06/11/2019   CHOLHDL 3 06/11/2019  -Continues pravastatin 40 and omega-3 fatty acids.  No side effects.   Philemon Kingdom, MD PhD [facility] Endocrinology

## 2020-04-21 ENCOUNTER — Other Ambulatory Visit: Payer: Self-pay

## 2020-04-21 ENCOUNTER — Ambulatory Visit (HOSPITAL_COMMUNITY)
Admission: RE | Admit: 2020-04-21 | Discharge: 2020-04-21 | Disposition: A | Discharge status: Home / Self Care | Payer: Medicare HMO | Source: Ambulatory Visit | Attending: Physician Assistant | Admitting: Physician Assistant

## 2020-04-21 ENCOUNTER — Other Ambulatory Visit: Payer: Self-pay | Admitting: Family Medicine

## 2020-04-21 VITALS — BP 110/70 | HR 93 | Ht 71.0 in | Wt 194.0 lb

## 2020-04-21 DIAGNOSIS — G4733 Obstructive sleep apnea (adult) (pediatric): Secondary | ICD-10-CM | POA: Insufficient documentation

## 2020-04-21 DIAGNOSIS — Z79899 Other long term (current) drug therapy: Secondary | ICD-10-CM | POA: Diagnosis not present

## 2020-04-21 DIAGNOSIS — M25561 Pain in right knee: Secondary | ICD-10-CM

## 2020-04-21 DIAGNOSIS — I130 Hypertensive heart and chronic kidney disease with heart failure and stage 1 through stage 4 chronic kidney disease, or unspecified chronic kidney disease: Secondary | ICD-10-CM | POA: Insufficient documentation

## 2020-04-21 DIAGNOSIS — I4892 Unspecified atrial flutter: Secondary | ICD-10-CM | POA: Insufficient documentation

## 2020-04-21 DIAGNOSIS — I4819 Other persistent atrial fibrillation: Secondary | ICD-10-CM | POA: Insufficient documentation

## 2020-04-21 DIAGNOSIS — D649 Anemia, unspecified: Secondary | ICD-10-CM

## 2020-04-21 DIAGNOSIS — Z7901 Long term (current) use of anticoagulants: Secondary | ICD-10-CM

## 2020-04-21 DIAGNOSIS — Z87891 Personal history of nicotine dependence: Secondary | ICD-10-CM | POA: Insufficient documentation

## 2020-04-21 DIAGNOSIS — I484 Atypical atrial flutter: Secondary | ICD-10-CM | POA: Diagnosis not present

## 2020-04-21 DIAGNOSIS — I5022 Chronic systolic (congestive) heart failure: Secondary | ICD-10-CM | POA: Insufficient documentation

## 2020-04-21 DIAGNOSIS — M25562 Pain in left knee: Secondary | ICD-10-CM

## 2020-04-21 DIAGNOSIS — N1832 Chronic kidney disease, stage 3b: Secondary | ICD-10-CM | POA: Insufficient documentation

## 2020-04-21 DIAGNOSIS — G4700 Insomnia, unspecified: Secondary | ICD-10-CM

## 2020-04-21 DIAGNOSIS — E875 Hyperkalemia: Secondary | ICD-10-CM

## 2020-04-21 DIAGNOSIS — E1169 Type 2 diabetes mellitus with other specified complication: Secondary | ICD-10-CM

## 2020-04-21 DIAGNOSIS — F411 Generalized anxiety disorder: Secondary | ICD-10-CM

## 2020-04-21 DIAGNOSIS — D6869 Other thrombophilia: Secondary | ICD-10-CM | POA: Diagnosis not present

## 2020-04-21 DIAGNOSIS — Z8249 Family history of ischemic heart disease and other diseases of the circulatory system: Secondary | ICD-10-CM | POA: Insufficient documentation

## 2020-04-21 DIAGNOSIS — E785 Hyperlipidemia, unspecified: Secondary | ICD-10-CM

## 2020-04-21 DIAGNOSIS — I10 Essential (primary) hypertension: Secondary | ICD-10-CM

## 2020-04-21 DIAGNOSIS — H409 Unspecified glaucoma: Secondary | ICD-10-CM

## 2020-04-21 DIAGNOSIS — E669 Obesity, unspecified: Secondary | ICD-10-CM

## 2020-04-21 NOTE — Patient Instructions (Signed)
Dates as of right now available for tikosyn admission to hospital. November 30th December 6th December 14th

## 2020-04-21 NOTE — Progress Notes (Signed)
Primary Care Physician: [name], MD Primary Cardiologist: Dr Sallyanne Kuster Primary Electrophysiologist: Dr Curt Bears [facility]: Dr Haroldine Laws Referring Physician: Dr Alanson Aly is a 69 y.o. 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 Admitted 05/16/19 for PVI with successful procedure. Post procedure he developed cardiogenic shock and respiratory failure. CCM consulted for intubation. Advanced HF team consulted. As he improved he was extubated. Treated with pressors and gradually weaned off. Was discharged home on 05/29/19. During his admission his dofetilide was changed to amiodarone 2/2 renal insufficiency. He is on Eliquis for a CHADS2VASC score of 3. The research team received an alert for increased afib burden on ILR 01/01/20. Patient very surprised as he had no symptoms of afib. Patient is s/p repeat afib ablation 03/10/20 with Dr Curt Bears.   On follow up today, patient reports that he continues to feel "great". He denies any symptoms of fluid overload or heart racing. He remains in atrial flutter. ILR shows persistent heart rates ~90 bpm.   Today, he denies symptoms of palpitations, chest pain, shortness of breath, orthopnea, PND, lower extremity edema, dizziness, presyncope, syncope, bleeding, or neurologic sequela. The patient is tolerating medications without difficulties and is otherwise without complaint today.    Atrial Fibrillation Risk Factors:  he does have symptoms or diagnosis of sleep apnea. he is compliant with CPAP therapy.   he has a BMI of Body mass index is 27.06 kg/m.[name] Filed Weights   04/21/20 0857  Weight: 88 kg    Family History  Problem Relation Age of Onset  . Clotting disorder Mother   . Heart disease Mother        s/p MI  . Heart attack Mother   . Hypertension Mother   . Diabetes Mother   .  Hyperlipidemia Mother   . Stroke Mother   . Cancer Father        ? lung  . Lung disease Father        smoker  . Diabetes Sister   . Hypertension Sister        smoker  . Leukemia Maternal Grandmother        ?  [name] Aneurysm Sister        brain  . Other Sister        clipped  . Seizures Sister        d/o w/aneurysm/ smoker     Atrial Fibrillation Management history:  Previous antiarrhythmic drugs: dofetilide, amiodarone Previous cardioversions: 2016, 2019 x2, 02/2019 Previous ablations: 05/16/19, 03/10/20 CHADS2VASC score: 3 Anticoagulation history: Eliquis   Past Medical History:  Diagnosis Date  . AORTIC STENOSIS, MODERATE 12/05/2009  . Atrial fibrillation ([place])   . Atrial fibrillation with RVR ([place]) 07/05/2017  . Atrial flutter ([place]) 12/05/2009  . Atypical chest pain 11/08/2011  . BENIGN PROSTATIC HYPERTROPHY, HX OF 12/05/2009  . CHEST PAIN-UNSPECIFIED 11/11/2009  . CHF 12/05/2009  . Chronic renal insufficiency 12/16/2016  . Debility 04/18/2012  . Diarrhea 12/16/2016  . DYSPNEA 12/19/2009  . Edema 04/18/2012  . ERECTILE DYSFUNCTION 01/25/2007  . FATIGUE, CHRONIC 11/11/2009  . Gout 07/10/2012  . Heart murmur   . History of kidney stones    "they passed" (07/05/2017)  . Hyperkalemia 04/18/2012  . Hyperlipidemia   . Hypertension   . HYPERTENSION 01/25/2007  . Hypoxia 05/05/2011  . Insomnia 05/13/2016  .  INSOMNIA, HX OF 01/25/2007  . Knee pain, bilateral 12/28/2010  . Knee pain, right 12/28/2010  . NEUROMA 01/05/2010  . OBESITY NOS 01/25/2007  . OSA on CPAP 04/06/2010  . PERIPHERAL NEUROPATHY 01/05/2010  . PULMONARY FUNCTION TESTS, ABNORMAL 02/02/2010  . Superficial thrombophlebitis of left leg 09/07/2011  . TESTICULAR HYPOFUNCTION 01/05/2010  . 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 Toe pain, right 07/10/2012  . Type II diabetes mellitus ([place]) 12/05/2009  . UNSPECIFIED ANEMIA 01/05/2010   Past Surgical History:  Procedure Laterality Date  . A-FLUTTER ABLATION N/A 05/16/2019   Procedure: A-FLUTTER  ABLATION;  Surgeon: Constance Haw, MD;  Location: [place] [facility];  Service: Cardiovascular;  Laterality: N/A;  . ATRIAL FIBRILLATION ABLATION N/A 05/16/2019   Procedure: ATRIAL FIBRILLATION ABLATION;  Surgeon: Constance Haw, MD;  Location: [place] [facility];  Service: Cardiovascular;  Laterality: N/A;  . ATRIAL FIBRILLATION ABLATION N/A 03/10/2020   Procedure: ATRIAL FIBRILLATION ABLATION;  Surgeon: Constance Haw, MD;  Location: [place] [facility];  Service: Cardiovascular;  Laterality: N/A;  . CARDIAC CATHETERIZATION  10/16/2009   nonischemic cardiomyopathy  . CARDIOVERSION N/A 03/25/2015   Procedure: CARDIOVERSION;  Surgeon: Pixie Casino, MD;  Location: [place];  Service: Cardiovascular;  Laterality: N/A;  . CARDIOVERSION N/A 06/17/2017   Procedure: CARDIOVERSION;  Surgeon: Sanda Klein, MD;  Location: [place] [facility];  Service: Cardiovascular;  Laterality: N/A;  . CARDIOVERSION N/A 07/08/2017   Procedure: CARDIOVERSION;  Surgeon: Fay Records, MD;  Location: [facility];  Service: Cardiovascular;  Laterality: N/A;  . CARDIOVERSION N/A 02/27/2019   Procedure: CARDIOVERSION;  Surgeon: Sanda Klein, MD;  Location: [facility]OSCOPY;  Service: Cardiovascular;  Laterality: N/A;  . CARDIOVERSION N/A 08/16/2019   Procedure: CARDIOVERSION;  Surgeon: Sanda Klein, MD;  Location: [facility] ENDOSCOPY;  Service: Cardiovascular;  Laterality: N/A;  . METATARSAL OSTEOTOMY Bilateral ~ 1980   removed part of 5th metatarsal to corect curvature of toes   . RIGHT HEART CATH N/A 05/16/2019   Procedure: RIGHT HEART CATH;  Surgeon: Jolaine Artist, MD;  Location: [place] [facility];  Service: Cardiovascular;  Laterality: N/A;  . TEE WITHOUT CARDIOVERSION  05/16/2019   Procedure: Transesophageal Echocardiogram (Tee);  Surgeon: Constance Haw, MD;  Location: [place] [facility];  Service: Cardiovascular;;  . TEE WITHOUT CARDIOVERSION N/A 03/07/2020   Procedure: TRANSESOPHAGEAL  ECHOCARDIOGRAM (TEE);  Surgeon: Freada Bergeron, MD;  Location: [facility] ENDOSCOPY;  Service: Cardiovascular;  Laterality: N/A;    Current Outpatient Medications  Medication Sig Dispense Refill  . albuterol (VENTOLIN HFA) 108 (90 Base) MCG/ACT inhaler Inhale 2 puffs into the lungs every 6 (six) hours as needed for wheezing or shortness of breath. 1 Inhaler 6  . allopurinol (ZYLOPRIM) 300 MG tablet Take 1 tablet by mouth once daily 90 tablet 0  . Blood Glucose Monitoring Suppl (ONETOUCH VERIO) w/Device KIT Use to check blood sugar TID as directed.  Dx Code: E11.9 1 kit 0  . calcium carbonate (TUMS - DOSED IN MG ELEMENTAL CALCIUM) 500 MG chewable tablet Chew 500 mg by mouth daily as needed for indigestion or heartburn.    . cetirizine (ZYRTEC) 10 MG tablet Take 1 tablet (10 mg total) by mouth daily. 30 tablet 11  . Clobetasol Prop Emollient Base (CLOBETASOL PROPIONATE E) 0.05 % emollient cream Apply 1 application topically 2 (two) times daily as needed (insect bite). 60 g 1  . colchicine (COLCRYS) 0.6 MG tablet Take one tablet for 4-6 days as needed for gout flare  up. 15 tablet 3  . dapagliflozin propanediol (FARXIGA) 10 MG TABS tablet Take 1 tablet (10 mg total) by mouth daily before breakfast. 30 tablet 6  . ELIQUIS 5 MG TABS tablet Take 1 tablet by mouth twice daily 180 tablet 1  . fish oil-omega-3 fatty acids 1000 MG capsule Take 1 g by mouth daily.     . furosemide (LASIX) 80 MG tablet Take 1 tablet (80 mg total) by mouth daily. As needed patient may take an additional 40 MG tablet as direct per Al[employer] Research study PRN plan. 30 tablet 6  . glucose blood test strip (Verio) Use as instructed Check blood sugars TID DxE11.9 100 each 12  . glyBURIDE (DIABETA) 5 MG tablet Take 2.5 mg by mouth daily as needed (when eating a big meal).     . hydrALAZINE (APRESOLINE) 25 MG tablet Take 1 tablet (25 mg total) by mouth 3 (three) times daily. 270 tablet 2  . HYDROcodone-acetaminophen (NORCO) 10-325 MG  tablet Take 1 tablet by mouth every 8 (eight) hours as needed for moderate pain or severe pain. 90 tablet 0  . hydrocortisone 2.5 % ointment Apply topically 2 (two) times daily as needed. 30 g 1  . isosorbide mononitrate (IMDUR) 30 MG 24 hr tablet Take 1 tablet (30 mg total) by mouth daily. 30 tablet 11  . LORazepam (ATIVAN) 1 MG tablet TAKE 1 TABLET BY MOUTH EVERY 8 HOURS AS NEEDED FOR ANXIETY 30 tablet 0  . magnesium oxide (MAG-OX) 400 MG tablet Take 1 tablet (400 mg total) by mouth 2 (two) times daily. 30 tablet 11  . metoprolol succinate (TOPROL-XL) 50 MG 24 hr tablet Take 1 tablet (50 mg total) by mouth 2 (two) times daily. Take with or immediately following a meal. 180 tablet 3  . Multiple Vitamin (MULTIVITAMIN WITH MINERALS) TABS tablet Take 1 tablet by mouth daily.     . naproxen (NAPROSYN) 375 MG tablet Take 375 mg by mouth 2 (two) times daily as needed (knee pain.).     [name] Polyethyl Glycol-Propyl Glycol (SYSTANE OP) Place 1 drop into both eyes daily as needed (dry eyes).    . potassium chloride SA (KLOR-CON) 20 MEQ tablet Take 1 tablet (20 mEq total) by mouth as directed. 1 tablet daily 20 mEq by mouth as directed per alleviate research protocol. 180 tablet 1  . pravastatin (PRAVACHOL) 40 MG tablet Take 1 tablet (40 mg total) by mouth daily. 90 tablet 1  . tamsulosin (FLOMAX) 0.4 MG CAPS capsule Take 1 capsule by mouth once daily 30 capsule 0  . temazepam (RESTORIL) 30 MG capsule Take 1 capsule by mouth at bedtime 30 capsule 1   No current facility-administered medications for this encounter.    Allergies  Allergen Reactions  . Sulfonamide Derivatives Other (See Comments)    Flu like symptoms   . Zolpidem Other (See Comments)    Excessive, prolonged sedation    Social History   Socioeconomic History  . Marital status: Married    Spouse name: Not on file  . Number of children: 4  . Years of education: 69  . Highest education level: High school graduate  Occupational History    . Occupation: retired    Fish farm manager: [employer]  Tobacco Use  . Smoking status: Former Smoker    Packs/day: 1.00    Years: 10.00    Pack years: 10.00    Quit date: 06/07/1980    Years since quitting: 39.8  . Smokeless tobacco: Never Used  Vaping Use  . Vaping Use: Never used  Substance and Sexual Activity  . Alcohol use: No  . Drug use: No  . Sexual activity: Not Currently  Other Topics Concern  . Not on file  Social History Narrative   Lives with male partner in a one story home.  His son lives there off and on.  Retired from [employer].  Education: high school.  Right handed   Social Determinants of Health   Financial Resource Strain:   . Difficulty of Paying Living Expenses: Not on file  Food Insecurity:   . Worried About Charity fundraiser in the Last Year: Not on file  . Ran Out of Food in the Last Year: Not on file  Transportation Needs:   . Lack of Transportation (Medical): Not on file  . Lack of Transportation (Non-Medical): Not on file  Physical Activity:   . Days of Exercise per Week: Not on file  . Minutes of Exercise per Session: Not on file  Stress:   . Feeling of Stress : Not on file  Social Connections:   . Frequency of Communication with Friends and Family: Not on file  . Frequency of Social Gatherings with Friends and Family: Not on file  . Attends Religious Services: Not on file  . Active Member of Clubs or Organizations: Not on file  . Attends Archivist Meetings: Not on file  . Marital Status: Not on file  Intimate Partner Violence:   . Fear of Current or Ex-Partner: Not on file  . Emotionally Abused: Not on file  . Physically Abused: Not on file  . Sexually Abused: Not on file     ROS- All systems are reviewed and negative except as per the HPI above.  Physical Exam: Vitals:   04/21/20 0857  BP: 110/70  Pulse: 93  Weight: 88 kg  Height: _0  (1.803 m)    GEN- The patient is well appearing, alert and oriented x 3 today.    HEENT-head normocephalic, atraumatic, sclera clear, conjunctiva pink, hearing intact, trachea midline. Lungs- Clear to ausculation bilaterally, normal work of breathing Heart- irregular rate and rhythm, no murmurs, rubs or gallops  GI- soft, NT, ND, + BS Extremities- no clubbing, cyanosis, or edema MS- no significant deformity or atrophy Skin- no rash or lesion Psych- euthymic mood, full affect Neuro- strength and sensation are intact   Wt Readings from Last 3 Encounters:  04/21/20 88 kg  04/18/20 87.3 kg  04/07/20 86.5 kg    EKG today demonstrates atypical atrial flutter with variable block HR 93, QRS 100, QTc 474  Echo 05/28/19 demonstrated   1. Left ventricular ejection fraction, by visual estimation, is 40 to 45%. The left ventricle has moderately decreased function. There is mildly increased left ventricular hypertrophy.  2. Left ventricular diastolic parameters are indeterminate.  3. The left ventricle demonstrates global hypokinesis.  4. Global right ventricle has normal systolic function.The right ventricular size is normal. No increase in right ventricular wall thickness.  5. Left atrial size was normal.  6. Right atrial size was normal.  7. The mitral valve is normal in structure. Trivial mitral valve regurgitation. No evidence of mitral stenosis.  8. The tricuspid valve is normal in structure. Tricuspid valve regurgitation is trivial.  9. Aortic valve regurgitation is moderate. 10. The aortic valve is normal in structure. Aortic valve regurgitation is moderate. No evidence of aortic valve sclerosis or stenosis. 11. Pulmonic regurgitation is moderate. 12. The pulmonic valve  was normal in structure. Pulmonic valve regurgitation is moderate. 13. The inferior vena cava is normal in size with greater than 50% respiratory variability, suggesting right atrial pressure of 3 mmHg.  Epic records are reviewed at length today  Assessment and Plan:  1. Persistent atrial  fibrillation/atrial flutter S/p afib and atrial flutter ablation with Dr Curt Bears 42/5/52 complicated by cardiogenic shock.  Recall amiodarone stopped 2/2 tremors and dofetilide stopped 2/2 renal insufficiency. (Recent CrCl 44 mL/min) S/p repeat afib ablation 03/10/20 We had another long discussion about rhythm control today. We discussed Dr Macky Lower recommendation for dofetilide admission +/- DCCV. He is still very hesitant to make changes given his paucity of symptoms. We discussed afib as a disease state and the relation of afib to CHF. He would like to hear Dr Croitoru's opinion before making any decisions. Will reach out to Dr C. Information about dofetilide admission given as well as potential admission dates. Continue Toprol 25 mg BID Continue Eliquis 5 mg BID  This patients CHA2DS2-VASc Score and unadjusted Ischemic Stroke Rate (% per year) is equal to 3.2 % stroke rate/year from a score of 3  Above score calculated as 1 point each if present [CHF, HTN, DM, Vascular=MI/PAD/Aortic Plaque, Age if 65-74, or Male] Above score calculated as 2 points each if present [Age > 75, or Stroke/TIA/TE]  2. Obstructive sleep apnea Patient reports compliance with CPAP therapy.  3. Chronic systolic CHF EF improved to 35-40% No signs or symptoms of fluid overload.  4. HTN Stable, no changes today.   Follow up with Dr Curt Bears and Dr Sallyanne Kuster as scheduled. AF clinic pending patient's decision regarding dofetilide.    [facility] [address] [place], [place] [address] [phone] 04/21/2020 10:18 AM

## 2020-05-12 DIAGNOSIS — Z006 Encounter for examination for normal comparison and control in clinical research program: Secondary | ICD-10-CM

## 2020-05-12 NOTE — Research (Signed)
Alleviate Research Study  [facility] Transmission/Investigator notification

## 2020-05-14 DIAGNOSIS — G4733 Obstructive sleep apnea (adult) (pediatric): Secondary | ICD-10-CM | POA: Diagnosis not present

## 2020-05-20 VITALS — BP 110/72 | HR 105 | Resp 14

## 2020-05-20 DIAGNOSIS — Z006 Encounter for examination for normal comparison and control in clinical research program: Secondary | ICD-10-CM

## 2020-05-20 NOTE — Research (Addendum)
Alleviate 12 Month Visit  Patient doing well at this time, no adverse events or medication changes to report.  6 minute walk test performed and labs drawn, patient assistant marked upon lab draw.  Current Outpatient Medications:  .  albuterol (VENTOLIN HFA) 108 (90 Base) MCG/ACT inhaler, Inhale 2 puffs into the lungs every 6 (six) hours as needed for wheezing or shortness of breath., Disp: 1 Inhaler, Rfl: 6 .  allopurinol (ZYLOPRIM) 300 MG tablet, Take 1 tablet (300 mg total) by mouth daily., Disp: 90 tablet, Rfl: 0 .  Blood Glucose Monitoring Suppl (ONETOUCH VERIO) w/Device KIT, Use to check blood sugar TID as directed.  Dx Code: E11.9, Disp: 1 kit, Rfl: 0 .  calcium carbonate (TUMS - DOSED IN MG ELEMENTAL CALCIUM) 500 MG chewable tablet, Chew 500 mg by mouth daily as needed for indigestion or heartburn., Disp: , Rfl:  .  cetirizine (ZYRTEC) 10 MG tablet, Take 1 tablet (10 mg total) by mouth daily., Disp: 30 tablet, Rfl: 11 .  Clobetasol Prop Emollient Base (CLOBETASOL PROPIONATE E) 0.05 % emollient cream, Apply 1 application topically 2 (two) times daily as needed (insect bite)., Disp: 60 g, Rfl: 1 .  colchicine (COLCRYS) 0.6 MG tablet, Take one tablet for 4-6 days as needed for gout flare up., Disp: 15 tablet, Rfl: 3 .  dapagliflozin propanediol (FARXIGA) 10 MG TABS tablet, Take 1 tablet (10 mg total) by mouth daily before breakfast., Disp: 30 tablet, Rfl: 6 .  ELIQUIS 5 MG TABS tablet, Take 1 tablet by mouth twice daily, Disp: 180 tablet, Rfl: 1 .  fish oil-omega-3 fatty acids 1000 MG capsule, Take 1 g by mouth daily., Disp: , Rfl:  .  furosemide (LASIX) 80 MG tablet, Take 1 tablet (80 mg total) by mouth daily. As needed patient may take an additional 40 MG tablet as direct per Alleviate Research study PRN plan., Disp: 30 tablet, Rfl: 6 .  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  glyBURIDE (DIABETA) 5 MG tablet, Take 2.5 mg  by mouth daily as needed (when eating a big meal). , Disp: , Rfl:  .  hydrALAZINE (APRESOLINE) 25 MG tablet, Take 1 tablet (25 mg total) by mouth 3 (three) times daily., Disp: 270 tablet, Rfl: 2 .  HYDROcodone-acetaminophen (NORCO) 10-325 MG tablet, Take 1 tablet by mouth every 8 (eight) hours as needed for moderate pain or severe pain., Disp: 90 tablet, Rfl: 0 .  hydrocortisone 2.5 % ointment, Apply topically 2 (two) times daily as needed., Disp: 30 g, Rfl: 1 .  isosorbide mononitrate (IMDUR) 30 MG 24 hr tablet, Take 1 tablet (30 mg total) by mouth daily., Disp: 30 tablet, Rfl: 11 .  LORazepam (ATIVAN) 1 MG tablet, TAKE 1 TABLET BY MOUTH EVERY 8 HOURS AS NEEDED FOR ANXIETY, Disp: 30 tablet, Rfl: 0 .  magnesium oxide (MAG-OX) 400 MG tablet, Take 1 tablet (400 mg total) by mouth 2 (two) times daily., Disp: 30 tablet, Rfl: 11 .  metoprolol succinate (TOPROL-XL) 50 MG 24 hr tablet, Take 1 tablet (50 mg total) by mouth 2 (two) times daily. Take with or immediately following a meal., Disp: 180 tablet, Rfl: 3 .  Multiple Vitamin (MULTIVITAMIN WITH MINERALS) TABS tablet, Take 1 tablet by mouth daily. , Disp: , Rfl:  .  naproxen (NAPROSYN) 375 MG tablet, Take 375 mg by mouth 2 (two) times daily as needed (knee pain.). , Disp: , Rfl:  .  Polyethyl Glycol-Propyl Glycol (SYSTANE OP), Place 1 drop into both eyes daily as needed (dry eyes)., Disp: , Rfl:  .  potassium chloride SA (KLOR-CON) 20 MEQ tablet, Take 1 tablet (20 mEq total) by mouth as directed. 1 tablet daily 20 mEq by mouth as directed per alleviate research protocol., Disp: 180 tablet, Rfl: 1 .  pravastatin (PRAVACHOL) 40 MG tablet, Take 1 tablet (40 mg total) by mouth daily., Disp: 90 tablet, Rfl: 1 .  tamsulosin (FLOMAX) 0.4 MG CAPS capsule, Take 1 capsule by mouth once daily, Disp: 30 capsule, Rfl: 0 .  temazepam (RESTORIL) 30 MG capsule, Take 1 capsule by mouth at bedtime, Disp: 30 capsule, Rfl: 1   [id number]  MOBILITY:    I HAVE NO PROBLEMS  WALKING []  I HAVE SLIGHT PROBLEMS WALKING [x]  I HAVE MODERATE PROBLEMS WALKING []  I HAVE SEVERE PROBLEMS WALKING []  I AM UNABLE TO WALK  []    SELF-CARE:   I HAVE NO PROBLEMS WASHING OR DRESSING MYSELF  [x]  I HAVE SLIGHT PROBLEMS WASHING OR DRESSING MYSELF  []  I HAVE MODERATE PROBLEMS WASHING OR DRESSING MYSELF []  I HAVE SEVERE PROBLEMS WASHING OR DRESSING MYSELF  []  I HAVE SEVERE PROBLEMS WASHING OR DRESSING MYSELF  []  I AM UNABLE TO WASH OR DRESS MYSELF []    USUAL ACTIVITIES: (E.G. WORK/STUDY/HOUSEWORK/FAMILY OR LEISURE ACTIVITIES.    I HAVE NO PROBLEMS DOING MY USUAL ACTIVITIES [x]  I HAVE SLIGHT PROBLEMS DOING MY USUAL ACTIVITIES []  I HAVE MODERATE PROBLEMS DOING MY USUAL ACTIVIITIES []  I HAVE SEVERE PROBLEMS DOING MY USUAL ACTIVITIES []  I AM UNABLE TO DO MY USUAL ACTIVITIES []    PAIN /DISCOMFORT   I HAVE NO PAIN OR DISCOMFORT []  I HAVE SLIGHT PAIN OR DISCOMFORT [x]  I HAVE MODERATE PAIN OR DISCOMFORT []  I HAVE SEVERE PAIN OR DISCOMFORT []  I HAVE EXTREME PAIN OR DISCOMFORT []    ANXIETY/DEPRESSION   I AM NOT ANXIOUS OR DEPRESSED [x]  I AM SLIGHTLY ANXIOUS OR DEPRESSED []  I AM MODERATELY ANXIOUS OR DREPRESSED []  I AM SEVERELY ANXIOUS OR DEPRESSED []  I AM EXTREMELY ANXIOUS OR DEPRESSED []    SCALE OF 0-100 HOW WOULD YOU RATE TODAY?  0 IS THE WORSE AND 100 IS THE BEST HEALTH YOU CAN IMAGINE: 90   [facility]  The following questions refer to your heart failure and how it may affect your life. Please read and complete the following questions. There are no right or wrong answers. Please mark the answer that best applies to you.  1. Heart Failure affects different people in different ways. Some feel shortness of breath while others feel fatigue. Please indicate how much you are limited by your Heart Failure ( shortness or breath or fatigue) in your ability to do the following activities over the past 2 weeks.  Activity Extermely Limited Quite a bit Limited  Moderately Limited Slightly Limited Not at all Limited Limited other reasons or did not do the activity  Dressing yourself [] [] [] [] [x] []  Showering/Bathing [] [] [] [] [x] []  Walking 1 block on level ground [] [] [] [] [x] []  Doing yardwork, housework, or carrying groceries  []  []  []  []  [x]  []  Climbing a flight of stairs without stopping  []   []  []  []  [x]  []  Hurrying or jogging (  as if to catch a bus)  []  []  []  [x]  []  []    2. Compared with 2 weeks ago, have your symptoms of heart failure (shortness of breath, fatigue or ankle swelling) changed?                   My symptoms of heart failure have become ..  Much Worse  Slight worse  Not changed  Slight better  Much better I've had no symptoms over the last 2 weeks  [] [] [x] [] [] []             3. Over the past 2 weeks, how many times did you have swelling in your feet, ankles, or legs when you woke up in the morning?   Every morning 3 or more times a week, but not every day 1-2 times a week Less than once a week Never over the past 2 weeks  [] [] [] [] [x]   4. Over the past 2 weeks how much has swelling in your feet, ankles, or legs bothered you? Extremely bothersome Quite a bit bothersome Moderately bothersome Slight bothersome  Not at all bothersome I've had no swelling  [] [] [] [] [] [x]   5. Over the past 2 weeks, on average, how many times has fatigue limited your ability to do what you want? All of the time Several times per day At least once a day 3 or more times a week but not every day 1-2 times per week Less than once a week Never over the past 2 weeks  [] [] [] [] [] [] [x]   6.     Over the past 2 weeks, how much has your fatigue bothered you? Extremely bothersome Quite a bit bothersome Moderately bothersome Slight bothersome  Not at all bothersome I've had no swelling  [] [] [] [] [] [x]   7.      Over the past 2 weeks, on average, how many times has shortness of  breath limited your ability to do what you want? All of the time Several times per day At least once a day 3 or more times a week but not every day 1-2 times per week Less than once a week Never over the past 2 weeks  [] [] [] [] [] [] [x]   8.        Over the past 2 weeks, has shortness of breath bothered you? Extremely bothersome Quite a bit bothersome Moderately bothersome Slight bothersome  Not at all bothersome I've had no swelling  [] [] [] [] [] [x]           9.      Over the  past 2 weeks, on average, how many times have you been found to sleep sitting up in a chair or with at least 3 pillows to prop you up because of shortness of breath? Every night  3 or more times a week, but not every night 1-2 times a week  Less than once a week Never over the past 2 weeks   [] [] [] [] [x]         10.      Heart failure symptoms can worsen for a number of reasons. How sure are you that you know what to do, or whom to call, if your heart failure gets worse?  Not at all sure Not very sure Somewhat sure Mostly sure   Completely sure  [] [] [] [] [x]           11.     How well do you understand what things you are able to keep your Heart failure symptoms from getting worse? ( for example weighing yourself,        eating salt diet, ect.) Don't understand at all Do not understand very well Somewhat understand Mostly understand Completely understand  [] [] [] [] [x]          12.        Over the past 2 weeks, how much has your heart failure limited enjoyment of life? It has extremely limited my enjoyment of life It has limited my enjoyment of life quite a bit  It has Moderately limited my enjoyment of life It has slight has limited my enjoyment of life It has not limited my enjoyment of life  [] [] [] [] [x]          13.        If you had to spend the rest of your life with heart failure the way I is right now, how would you feel about this? Not at all satisfied Mostly dissatisfied Somewhat satisfied  Mostly satisfied Completely satisfied  [] [] [x] [] []             14.    Over the past 2 weeks, how often have you felt discouraged or down in the dumps because of your heart failure? I felt that way all of the time I felt that way most of the time  I occasionally felt that way I rarely felt that way I never felt that way   [] [] [] [] [x]      15.         How much does your heart failure affect your lifestyle? Please indicate how your heart failure may have limited you participation in the following activates over the past 2 weeks.  Activity Severely Limited Limited quite a bit  Moderately Limited Slightly Limited Did not limit at all Does not apply or did not do for other reasons  Hobbies, recreational activities [] [] [] [] [x] []  Working or doing household chores [] [] [] [] [x] []  Visiting family or friends out of your house  []  []  []  []  [x]  []  Intimate relationships with loved ones   []  []  []  []  [x]  []         

## 2020-05-20 NOTE — Progress Notes (Signed)
PCP: [name], MD Primary Cardiologist: Dr Sallyanne Kuster CHF: Dr Haroldine Laws Primary EP: Dr Curt Bears  [name] is a 69 y.o. male who presents today for routine research followup for Alleviate HF trial.  Since last being seen, the patient reports doing very well.  He is active.  He enjoys [employer].  He continues to have atrial arrhythmias but is mostly asymptomatic currently.  Today, he denies symptoms of palpitations, chest pain, shortness of breath,  lower extremity edema, dizziness, presyncope, or syncope.  The patient is otherwise without complaint today.   Past Medical History:  Diagnosis Date  . AORTIC STENOSIS, MODERATE 12/05/2009  . Atrial fibrillation ([place])   . Atrial fibrillation with RVR ([place]) 07/05/2017  . Atrial flutter ([place]) 12/05/2009  . Atypical chest pain 11/08/2011  . BENIGN PROSTATIC HYPERTROPHY, HX OF 12/05/2009  . CHEST PAIN-UNSPECIFIED 11/11/2009  . CHF 12/05/2009  . Chronic renal insufficiency 12/16/2016  . Debility 04/18/2012  . Diarrhea 12/16/2016  . DYSPNEA 12/19/2009  . Edema 04/18/2012  . ERECTILE DYSFUNCTION 01/25/2007  . FATIGUE, CHRONIC 11/11/2009  . Gout 07/10/2012  . Heart murmur   . History of kidney stones    "they passed" (07/05/2017)  . Hyperkalemia 04/18/2012  . Hyperlipidemia   . Hypertension   . HYPERTENSION 01/25/2007  . Hypoxia 05/05/2011  . Insomnia 05/13/2016  . INSOMNIA, HX OF 01/25/2007  . Knee pain, bilateral 12/28/2010  . Knee pain, right 12/28/2010  . NEUROMA 01/05/2010  . OBESITY NOS 01/25/2007  . OSA on CPAP 04/06/2010  . PERIPHERAL NEUROPATHY 01/05/2010  . PULMONARY FUNCTION TESTS, ABNORMAL 02/02/2010  . Superficial thrombophlebitis of left leg 09/07/2011  . TESTICULAR HYPOFUNCTION 01/05/2010  . 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 Toe pain, right 07/10/2012  . Type II diabetes mellitus ([place]) 12/05/2009  . UNSPECIFIED ANEMIA 01/05/2010   Past Surgical History:  Procedure Laterality Date  . A-FLUTTER ABLATION N/A 05/16/2019   Procedure: A-FLUTTER  ABLATION;  Surgeon: Constance Haw, MD;  Location: [place] [facility];  Service: Cardiovascular;  Laterality: N/A;  . ATRIAL FIBRILLATION ABLATION N/A 05/16/2019   Procedure: ATRIAL FIBRILLATION ABLATION;  Surgeon: Constance Haw, MD;  Location: [place] [facility];  Service: Cardiovascular;  Laterality: N/A;  . ATRIAL FIBRILLATION ABLATION N/A 03/10/2020   Procedure: ATRIAL FIBRILLATION ABLATION;  Surgeon: Constance Haw, MD;  Location: [place] [facility];  Service: Cardiovascular;  Laterality: N/A;  . CARDIAC CATHETERIZATION  10/16/2009   nonischemic cardiomyopathy  . CARDIOVERSION N/A 03/25/2015   Procedure: CARDIOVERSION;  Surgeon: Pixie Casino, MD;  Location: [place];  Service: Cardiovascular;  Laterality: N/A;  . CARDIOVERSION N/A 06/17/2017   Procedure: CARDIOVERSION;  Surgeon: Sanda Klein, MD;  Location: [place] [facility];  Service: Cardiovascular;  Laterality: N/A;  . CARDIOVERSION N/A 07/08/2017   Procedure: CARDIOVERSION;  Surgeon: Fay Records, MD;  [facility];  Service: Cardiovascular;  Laterality: N/A;  . CARDIOVERSION N/A 02/27/2019   Procedure: CARDIOVERSION;  Surgeon: Sanda Klein, MD;  Location: [facility];  Service: Cardiovascular;  Laterality: N/A;  . CARDIOVERSION N/A 08/16/2019   Procedure: CARDIOVERSION;  Surgeon: Sanda Klein, MD;  Location: [facility];  Service: Cardiovascular;  Laterality: N/A;  . METATARSAL OSTEOTOMY Bilateral ~ 1980   removed part of 5th metatarsal to corect curvature of toes   . RIGHT HEART CATH N/A 05/16/2019   Procedure: RIGHT HEART CATH;  Surgeon: Jolaine Artist, MD;  Location: [place] [facility];  Service: Cardiovascular;  Laterality: N/A;  . TEE  WITHOUT CARDIOVERSION  05/16/2019   Procedure: Transesophageal Echocardiogram (Tee);  Surgeon: Constance Haw, MD;  Location: [place] [facility];  Service: Cardiovascular;;  . TEE WITHOUT CARDIOVERSION N/A 03/07/2020   Procedure: TRANSESOPHAGEAL  ECHOCARDIOGRAM (TEE);  Surgeon: Freada Bergeron, MD;  Location: [facility];  Service: Cardiovascular;  Laterality: N/A;    ROS- all systems are reviewed and negatives except as per HPI above  Current Outpatient Medications  Medication Sig Dispense Refill  . albuterol (VENTOLIN HFA) 108 (90 Base) MCG/ACT inhaler Inhale 2 puffs into the lungs every 6 (six) hours as needed for wheezing or shortness of breath. 1 Inhaler 6  . allopurinol (ZYLOPRIM) 300 MG tablet Take 1 tablet (300 mg total) by mouth daily. 90 tablet 0  . Blood Glucose Monitoring Suppl (ONETOUCH VERIO) w/Device KIT Use to check blood sugar TID as directed.  Dx Code: E11.9 1 kit 0  . calcium carbonate (TUMS - DOSED IN MG ELEMENTAL CALCIUM) 500 MG chewable tablet Chew 500 mg by mouth daily as needed for indigestion or heartburn.    . cetirizine (ZYRTEC) 10 MG tablet Take 1 tablet (10 mg total) by mouth daily. 30 tablet 11  . Clobetasol Prop Emollient Base (CLOBETASOL PROPIONATE E) 0.05 % emollient cream Apply 1 application topically 2 (two) times daily as needed (insect bite). 60 g 1  . colchicine (COLCRYS) 0.6 MG tablet Take one tablet for 4-6 days as needed for gout flare up. 15 tablet 3  . dapagliflozin propanediol (FARXIGA) 10 MG TABS tablet Take 1 tablet (10 mg total) by mouth daily before breakfast. 30 tablet 6  . ELIQUIS 5 MG TABS tablet Take 1 tablet by mouth twice daily 180 tablet 1  . fish oil-omega-3 fatty acids 1000 MG capsule Take 1 g by mouth daily.    . furosemide (LASIX) 80 MG tablet Take 1 tablet (80 mg total) by mouth daily. As needed patient may take an additional 40 MG tablet as direct per Alleviate Research study PRN plan. 30 tablet 6  . glucose blood test strip (Verio) Use as instructed Check blood sugars TID DxE11.9 100 each 12  . glyBURIDE (DIABETA) 5 MG tablet Take 2.5 mg by mouth daily as needed (when eating a big meal).     . hydrALAZINE (APRESOLINE) 25 MG tablet Take 1 tablet (25 mg total) by mouth 3  (three) times daily. 270 tablet 2  . HYDROcodone-acetaminophen (NORCO) 10-325 MG tablet Take 1 tablet by mouth every 8 (eight) hours as needed for moderate pain or severe pain. 90 tablet 0  . hydrocortisone 2.5 % ointment Apply topically 2 (two) times daily as needed. 30 g 1  . isosorbide mononitrate (IMDUR) 30 MG 24 hr tablet Take 1 tablet (30 mg total) by mouth daily. 30 tablet 11  . LORazepam (ATIVAN) 1 MG tablet TAKE 1 TABLET BY MOUTH EVERY 8 HOURS AS NEEDED FOR ANXIETY 30 tablet 0  . magnesium oxide (MAG-OX) 400 MG tablet Take 1 tablet (400 mg total) by mouth 2 (two) times daily. 30 tablet 11  . metoprolol succinate (TOPROL-XL) 50 MG 24 hr tablet Take 1 tablet (50 mg total) by mouth 2 (two) times daily. Take with or immediately following a meal. 180 tablet 3  . Multiple Vitamin (MULTIVITAMIN WITH MINERALS) TABS tablet Take 1 tablet by mouth daily.     . naproxen (NAPROSYN) 375 MG tablet Take 375 mg by mouth 2 (two) times daily as needed (knee pain.).     [name] Polyethyl Glycol-Propyl Glycol (SYSTANE  OP) Place 1 drop into both eyes daily as needed (dry eyes).    . potassium chloride SA (KLOR-CON) 20 MEQ tablet Take 1 tablet (20 mEq total) by mouth as directed. 1 tablet daily 20 mEq by mouth as directed per alleviate research protocol. 180 tablet 1  . pravastatin (PRAVACHOL) 40 MG tablet Take 1 tablet (40 mg total) by mouth daily. 90 tablet 1  . tamsulosin (FLOMAX) 0.4 MG CAPS capsule Take 1 capsule by mouth once daily 30 capsule 0  . temazepam (RESTORIL) 30 MG capsule Take 1 capsule by mouth at bedtime 30 capsule 1   No current facility-administered medications for this visit.    Physical Exam: Vitals:   05/20/20 1052  BP: 110/72  Pulse: (!) 105  Resp: 14  SpO2: 100%     GEN- The patient is well appearing, alert and oriented x 3 today.   Head- normocephalic, atraumatic Eyes-  Sclera clear, conjunctiva pink Ears- hearing intact Oropharynx- clear Lungs- Clear to ausculation bilaterally,  normal work of breathing Heart- Regular rate and rhythm, 2/6 diastolic murmur LUSB GI- soft, NT, ND, + BS Extremities- no clubbing, cyanosis, or edema  Wt Readings from Last 3 Encounters:  04/21/20 194 lb (88 kg)  04/18/20 192 lb 6.4 oz (87.3 kg)  04/07/20 190 lb 12.8 oz (86.5 kg)     Assessment and Plan:  1. Chronic systolic dysfunction with biventricular CHF Clinically stable with NYHA Class II symptoms Doing well with Alleviate HF trial    Thompson Grayer MD, [facility] 05/20/2020 12:30 PM

## 2020-05-21 LAB — CBC
Hematocrit: 47 % (ref 37.5–51.0)
Hemoglobin: 15.1 g/dL (ref 13.0–17.7)
MCH: 25.2 pg — ABNORMAL LOW (ref 26.6–33.0)
MCHC: 32.1 g/dL (ref 31.5–35.7)
MCV: 79 fL (ref 79–97)
Platelets: 197 10*3/uL (ref 150–450)
RBC: 5.99 x10E6/uL — ABNORMAL HIGH (ref 4.14–5.80)
RDW: 16.2 % — ABNORMAL HIGH (ref 11.6–15.4)
WBC: 4.1 10*3/uL (ref 3.4–10.8)

## 2020-05-21 LAB — BASIC METABOLIC PANEL
BUN/Creatinine Ratio: 18 (ref 10–24)
BUN: 33 mg/dL — ABNORMAL HIGH (ref 8–27)
CO2: 23 mmol/L (ref 20–29)
Calcium: 9.4 mg/dL (ref 8.6–10.2)
Chloride: 102 mmol/L (ref 96–106)
Creatinine, Ser: 1.87 mg/dL — ABNORMAL HIGH (ref 0.76–1.27)
GFR calc Af Amer: 42 mL/min/{1.73_m2} — ABNORMAL LOW (ref >59)
GFR calc non Af Amer: 36 mL/min/{1.73_m2} — ABNORMAL LOW (ref >59)
Glucose: 142 mg/dL — ABNORMAL HIGH (ref 65–99)
Potassium: 4.4 mmol/L (ref 3.5–5.2)
Sodium: 138 mmol/L (ref 134–144)

## 2020-05-21 LAB — BRAIN NATRIURETIC PEPTIDE: BNP: 353.1 pg/mL — ABNORMAL HIGH (ref 0.0–100.0)

## 2020-05-22 ENCOUNTER — Other Ambulatory Visit (HOSPITAL_COMMUNITY): Payer: Self-pay | Admitting: Adult Health

## 2020-05-22 ENCOUNTER — Other Ambulatory Visit: Payer: Self-pay

## 2020-05-22 ENCOUNTER — Encounter: Payer: Self-pay | Admitting: Family Medicine

## 2020-05-22 ENCOUNTER — Ambulatory Visit (INDEPENDENT_AMBULATORY_CARE_PROVIDER_SITE_OTHER): Payer: Medicare HMO | Admitting: Family Medicine

## 2020-05-22 VITALS — BP 108/64 | HR 100 | Temp 97.7°F | Resp 16 | Ht 71.0 in | Wt 198.5 lb

## 2020-05-22 DIAGNOSIS — M1A9XX0 Chronic gout, unspecified, without tophus (tophi): Secondary | ICD-10-CM

## 2020-05-22 DIAGNOSIS — I480 Paroxysmal atrial fibrillation: Secondary | ICD-10-CM

## 2020-05-22 DIAGNOSIS — G4700 Insomnia, unspecified: Secondary | ICD-10-CM

## 2020-05-22 DIAGNOSIS — E1169 Type 2 diabetes mellitus with other specified complication: Secondary | ICD-10-CM | POA: Diagnosis not present

## 2020-05-22 DIAGNOSIS — I5042 Chronic combined systolic (congestive) and diastolic (congestive) heart failure: Secondary | ICD-10-CM

## 2020-05-22 DIAGNOSIS — E669 Obesity, unspecified: Secondary | ICD-10-CM

## 2020-05-22 DIAGNOSIS — E782 Mixed hyperlipidemia: Secondary | ICD-10-CM | POA: Diagnosis not present

## 2020-05-22 DIAGNOSIS — G609 Hereditary and idiopathic neuropathy, unspecified: Secondary | ICD-10-CM

## 2020-05-22 DIAGNOSIS — I4891 Unspecified atrial fibrillation: Secondary | ICD-10-CM | POA: Diagnosis not present

## 2020-05-22 DIAGNOSIS — N189 Chronic kidney disease, unspecified: Secondary | ICD-10-CM

## 2020-05-22 DIAGNOSIS — I10 Essential (primary) hypertension: Secondary | ICD-10-CM

## 2020-05-22 DIAGNOSIS — Z23 Encounter for immunization: Secondary | ICD-10-CM

## 2020-05-22 LAB — URIC ACID: Uric Acid, Serum: 4.6 mg/dL (ref 4.0–7.8)

## 2020-05-22 LAB — LIPID PANEL
Cholesterol: 122 mg/dL (ref 0–200)
HDL: 37.5 mg/dL — ABNORMAL LOW (ref >39.00)
LDL Cholesterol: 65 mg/dL (ref 0–99)
NonHDL: 84.73
Total CHOL/HDL Ratio: 3
Triglycerides: 99 mg/dL (ref 0.0–149.0)
VLDL: 19.8 mg/dL (ref 0.0–40.0)

## 2020-05-22 LAB — TSH: TSH: 1.93 u[IU]/mL (ref 0.35–4.50)

## 2020-05-22 NOTE — Patient Instructions (Signed)
Try menthol or capsaicin or lidocaine topically  Selenium in Multivitamin  Peripheral Neuropathy Peripheral neuropathy is a type of nerve damage. It affects nerves that carry signals between the spinal cord and the arms, legs, and the rest of the body (peripheral nerves). It does not affect nerves in the spinal cord or brain. In peripheral neuropathy, one nerve or a group of nerves may be damaged. Peripheral neuropathy is a broad category that includes many specific nerve disorders, like diabetic neuropathy, hereditary neuropathy, and carpal tunnel syndrome. What are the causes? This condition may be caused by:  Diabetes. This is the most common cause of peripheral neuropathy.  Nerve injury.  Pressure or stress on a nerve that lasts a long time.  Lack (deficiency) of B vitamins. This can result from alcoholism, poor diet, or a restricted diet.  Infections.  Autoimmune diseases, such as rheumatoid arthritis and systemic lupus erythematosus.  Nerve diseases that are passed from parent to child (inherited).  Some medicines, such as cancer medicines (chemotherapy).  Poisonous (toxic) substances, such as lead and mercury.  Too little blood flowing to the legs.  Kidney disease.  Thyroid disease. In some cases, the cause of this condition is not known. What are the signs or symptoms? Symptoms of this condition depend on which of your nerves is damaged. Common symptoms include:  Loss of feeling (numbness) in the feet, hands, or both.  Tingling in the feet, hands, or both.  Burning pain.  Very sensitive skin.  Weakness.  Not being able to move a part of the body (paralysis).  Muscle twitching.  Clumsiness or poor coordination.  Loss of balance.  Not being able to control your bladder.  Feeling dizzy.  Sexual problems. How is this diagnosed? Diagnosing and finding the cause of peripheral neuropathy can be difficult. Your health care provider will take your medical  history and do a physical exam. A neurological exam will also be done. This involves checking things that are affected by your brain, spinal cord, and nerves (nervous system). For example, your health care provider will check your reflexes, how you move, and what you can feel. You may have other tests, such as:  Blood tests.  Electromyogram (EMG) and nerve conduction tests. These tests check nerve function and how well the nerves are controlling the muscles.  Imaging tests, such as CT scans or MRI to rule out other causes of your symptoms.  Removing a small piece of nerve to be examined in a lab (nerve biopsy). This is rare.  Removing and examining a small amount of the fluid that surrounds the brain and spinal cord (lumbar puncture). This is rare. How is this treated? Treatment for this condition may involve:  Treating the underlying cause of the neuropathy, such as diabetes, kidney disease, or vitamin deficiencies.  Stopping medicines that can cause neuropathy, such as chemotherapy.  Medicine to relieve pain. Medicines may include: ? Prescription or over-the-counter pain medicine. ? Antiseizure medicine. ? Antidepressants. ? Pain-relieving patches that are applied to painful areas of skin.  Surgery to relieve pressure on a nerve or to destroy a nerve that is causing pain.  Physical therapy to help improve movement and balance.  Devices to help you move around (assistive devices). Follow these instructions at home: Medicines  Take over-the-counter and prescription medicines only as told by your health care provider. Do not take any other medicines without first asking your health care provider.  Do not drive or use heavy machinery while taking prescription pain  medicine. Lifestyle   Do not use any products that contain nicotine or tobacco, such as cigarettes and e-cigarettes. Smoking keeps blood from reaching damaged nerves. If you need help quitting, ask your health care  provider.  Avoid or limit alcohol. Too much alcohol can cause a vitamin B deficiency, and vitamin B is needed for healthy nerves.  Eat a healthy diet. This includes: ? Eating foods that are high in fiber, such as fresh fruits and vegetables, whole grains, and beans. ? Limiting foods that are high in fat and processed sugars, such as fried or sweet foods. General instructions   If you have diabetes, work closely with your health care provider to keep your blood sugar under control.  If you have numbness in your feet: ? Check every day for signs of injury or infection. Watch for redness, warmth, and swelling. ? Wear padded socks and comfortable shoes. These help protect your feet.  Develop a good support system. Living with peripheral neuropathy can be stressful. Consider talking with a mental health specialist or joining a support group.  Use assistive devices and attend physical therapy as told by your health care provider. This may include using a walker or a cane.  Keep all follow-up visits as told by your health care provider. This is important. Contact a health care provider if:  You have new signs or symptoms of peripheral neuropathy.  You are struggling emotionally from dealing with peripheral neuropathy.  Your pain is not well-controlled. Get help right away if:  You have an injury or infection that is not healing normally.  You develop new weakness in an arm or leg.  You fall frequently. Summary  Peripheral neuropathy is when the nerves in the arms, or legs are damaged, resulting in numbness, weakness, or pain.  There are many causes of peripheral neuropathy, including diabetes, pinched nerves, vitamin deficiencies, autoimmune disease, and hereditary conditions.  Diagnosing and finding the cause of peripheral neuropathy can be difficult. Your health care provider will take your medical history, do a physical exam, and do tests, including blood tests and nerve function  tests.  Treatment involves treating the underlying cause of the neuropathy and taking medicines to help control pain. Physical therapy and assistive devices may also help. This information is not intended to replace advice given to you by your health care provider. Make sure you discuss any questions you have with your health care provider. Document Revised: 05/06/2017 Document Reviewed: 08/02/2016 Elsevier Patient Education  2020 [employer].

## 2020-05-22 NOTE — Assessment & Plan Note (Signed)
He notes increased imbetween toes and at balls of feet worse qhs and after a long day. Work on sugar. Try topical menthol or capsaicin or even lidocaine gel

## 2020-05-22 NOTE — Assessment & Plan Note (Signed)
Underwent cardioversion in October and while it is better he still runs a bit high has an appt with cardiology in early 2022

## 2020-05-22 NOTE — Assessment & Plan Note (Addendum)
hgba1c unacceptable at 8.1 but he is working on it and is eating better, minimize simple carbs. Increase exercise as tolerated. Continue current meds. Sugars at home doing better, 102 in am is the average. No sugars below 70 or above 200. Follows with Dr [name]. High dose flu shot given today.

## 2020-05-22 NOTE — Assessment & Plan Note (Signed)
Hydrate and monitor 

## 2020-05-23 ENCOUNTER — Other Ambulatory Visit (HOSPITAL_COMMUNITY): Payer: Self-pay | Admitting: Adult Health

## 2020-05-23 NOTE — Assessment & Plan Note (Signed)
Following closely with cardiology and doing very well

## 2020-05-23 NOTE — Assessment & Plan Note (Signed)
Well controlled, no changes to meds. Encouraged heart healthy diet such as the DASH diet and exercise as tolerated.  °

## 2020-05-23 NOTE — Progress Notes (Signed)
Subjective:    Patient ID: [name], male    DOB: [birth year], 69 y.o.   MRN: [id number]  Chief Complaint  Patient presents with  . Follow-up    HPI Patient is in today for follow upon chronic medical concerns. No recent febrile illness or hospitalizations. He has undergone a cardioversion since he was last seen and he has remained in regular rhythm although it has remained mildly elevated around 100. No concerning symptoms.Denies CP/palp/SOB/HA/congestion/fevers/GI or GU c/o. Taking meds as prescribed  Past Medical History:  Diagnosis Date  . AORTIC STENOSIS, MODERATE 12/05/2009  . Atrial fibrillation ([place])   . Atrial fibrillation with RVR ([place]) 07/05/2017  . Atrial flutter ([place]) 12/05/2009  . Atypical chest pain 11/08/2011  . BENIGN PROSTATIC HYPERTROPHY, HX OF 12/05/2009  . CHEST PAIN-UNSPECIFIED 11/11/2009  . CHF 12/05/2009  . Chronic renal insufficiency 12/16/2016  . Debility 04/18/2012  . Diarrhea 12/16/2016  . DYSPNEA 12/19/2009  . Edema 04/18/2012  . ERECTILE DYSFUNCTION 01/25/2007  . FATIGUE, CHRONIC 11/11/2009  . Gout 07/10/2012  . Heart murmur   . History of kidney stones    "they passed" (07/05/2017)  . Hyperkalemia 04/18/2012  . Hyperlipidemia   . Hypertension   . HYPERTENSION 01/25/2007  . Hypoxia 05/05/2011  . Insomnia 05/13/2016  . INSOMNIA, HX OF 01/25/2007  . Knee pain, bilateral 12/28/2010  . Knee pain, right 12/28/2010  . NEUROMA 01/05/2010  . OBESITY NOS 01/25/2007  . OSA on CPAP 04/06/2010  . PERIPHERAL NEUROPATHY 01/05/2010  . PULMONARY FUNCTION TESTS, ABNORMAL 02/02/2010  . Superficial thrombophlebitis of left leg 09/07/2011  . TESTICULAR HYPOFUNCTION 01/05/2010  . 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 Toe pain, right 07/10/2012  . Type II diabetes mellitus ([place]) 12/05/2009  . UNSPECIFIED ANEMIA 01/05/2010    Past Surgical History:  Procedure Laterality Date  . A-FLUTTER ABLATION N/A 05/16/2019   Procedure: A-FLUTTER ABLATION;  Surgeon: Constance Haw, MD;  Location: [facility];  Service: Cardiovascular;  Laterality: N/A;  . ATRIAL FIBRILLATION ABLATION N/A 05/16/2019   Procedure: ATRIAL FIBRILLATION ABLATION;  Surgeon: Constance Haw, MD;  Location: [facility];  Service: Cardiovascular;  Laterality: N/A;  . ATRIAL FIBRILLATION ABLATION N/A 03/10/2020   Procedure: ATRIAL FIBRILLATION ABLATION;  Surgeon: Constance Haw, MD;  Location: [facility];  Service: Cardiovascular;  Laterality: N/A;  . CARDIAC CATHETERIZATION  10/16/2009   nonischemic cardiomyopathy  . CARDIOVERSION N/A 03/25/2015   Procedure: CARDIOVERSION;  Surgeon: Pixie Casino, MD;  Location: [facility];  Service: Cardiovascular;  Laterality: N/A;  . CARDIOVERSION N/A 06/17/2017   Procedure: CARDIOVERSION;  Surgeon: Sanda Klein, MD;  Location: [facility];  Service: Cardiovascular;  Laterality: N/A;  . CARDIOVERSION N/A 07/08/2017   Procedure: CARDIOVERSION;  Surgeon: Fay Records, MD;  Location: [facility];  Service: Cardiovascular;  Laterality: N/A;  . CARDIOVERSION N/A 02/27/2019   Procedure: CARDIOVERSION;  Surgeon: Sanda Klein, MD;  Location: [facility];  Service: Cardiovascular;  Laterality: N/A;  . CARDIOVERSION N/A 08/16/2019   Procedure: CARDIOVERSION;  Surgeon: Sanda Klein, MD;  Location: [facility];  Service: Cardiovascular;  Laterality: N/A;  . METATARSAL OSTEOTOMY Bilateral ~ 1980   removed part of 5th metatarsal to corect curvature of toes   . RIGHT HEART CATH N/A 05/16/2019   Procedure: RIGHT HEART CATH;  Surgeon: Jolaine Artist, MD;  Location: [facility];  Service: Cardiovascular;  Laterality: N/A;  . TEE WITHOUT CARDIOVERSION  05/16/2019   Procedure: Transesophageal Echocardiogram (  Darden Dates);  Surgeon: Constance Haw, MD;  Location: [facility];  Service: Cardiovascular;;  . TEE WITHOUT CARDIOVERSION N/A 03/07/2020   Procedure: TRANSESOPHAGEAL ECHOCARDIOGRAM (TEE);  Surgeon: Freada Bergeron, MD;  Location:  [facility];  Service: Cardiovascular;  Laterality: N/A;    Family History  Problem Relation Age of Onset  . Clotting disorder Mother   . Heart disease Mother        s/p MI  . Heart attack Mother   . Hypertension Mother   . Diabetes Mother   . Hyperlipidemia Mother   . Stroke Mother   . Cancer Father        ? lung  . Lung disease Father        smoker  . Diabetes Sister   . Hypertension Sister        smoker  . Leukemia Maternal Grandmother        ?  [name] Aneurysm Sister        brain  . Other Sister        clipped  . Seizures Sister        d/o w/aneurysm/ smoker    Social History   Socioeconomic History  . Marital status: Married    Spouse name: Not on file  . Number of children: 4  . Years of education: 53  . Highest education level: High school graduate  Occupational History  . Occupation: retired    Fish farm manager: [employer]  Tobacco Use  . Smoking status: Former Smoker    Packs/day: 1.00    Years: 10.00    Pack years: 10.00    Quit date: 06/07/1980    Years since quitting: 39.9  . Smokeless tobacco: Never Used  Vaping Use  . Vaping Use: Never used  Substance and Sexual Activity  . Alcohol use: No  . Drug use: No  . Sexual activity: Not Currently  Other Topics Concern  . Not on file  Social History Narrative   Lives with male partner in a one story home.  His son lives there off and on.  Retired from [employer].  Education: high school.  Right handed   Social Determinants of Health   Financial Resource Strain: Not on file  Food Insecurity: Not on file  Transportation Needs: Not on file  Physical Activity: Not on file  Stress: Not on file  Social Connections: Not on file  Intimate Partner Violence: Not on file    Outpatient Medications Prior to Visit  Medication Sig Dispense Refill  . albuterol (VENTOLIN HFA) 108 (90 Base) MCG/ACT inhaler Inhale 2 puffs into the lungs every 6 (six) hours as needed for wheezing or shortness of breath. 1 Inhaler 6  .  allopurinol (ZYLOPRIM) 300 MG tablet Take 1 tablet (300 mg total) by mouth daily. 90 tablet 0  . Blood Glucose Monitoring Suppl (ONETOUCH VERIO) w/Device KIT Use to check blood sugar TID as directed.  Dx Code: E11.9 1 kit 0  . calcium carbonate (TUMS - DOSED IN MG ELEMENTAL CALCIUM) 500 MG chewable tablet Chew 500 mg by mouth daily as needed for indigestion or heartburn.    . cetirizine (ZYRTEC) 10 MG tablet Take 1 tablet (10 mg total) by mouth daily. 30 tablet 11  . Clobetasol Prop Emollient Base (CLOBETASOL PROPIONATE E) 0.05 % emollient cream Apply 1 application topically 2 (two) times daily as needed (insect bite). 60 g 1  . colchicine (COLCRYS) 0.6 MG tablet Take one tablet for 4-6 days as needed  for gout flare up. 15 tablet 3  . dapagliflozin propanediol (FARXIGA) 10 MG TABS tablet Take 1 tablet (10 mg total) by mouth daily before breakfast. 30 tablet 6  . ELIQUIS 5 MG TABS tablet Take 1 tablet by mouth twice daily 180 tablet 1  . fish oil-omega-3 fatty acids 1000 MG capsule Take 1 g by mouth daily.    . furosemide (LASIX) 80 MG tablet Take 1 tablet (80 mg total) by mouth daily. As needed patient may take an additional 40 MG tablet as direct per [employer] study PRN plan. 30 tablet 6  . glucose blood test strip (Verio) Use as instructed Check blood sugars TID DxE11.9 100 each 12  . glyBURIDE (DIABETA) 5 MG tablet Take 2.5 mg by mouth daily as needed (when eating a big meal).     . hydrALAZINE (APRESOLINE) 25 MG tablet Take 1 tablet (25 mg total) by mouth 3 (three) times daily. 270 tablet 2  . HYDROcodone-acetaminophen (NORCO) 10-325 MG tablet Take 1 tablet by mouth every 8 (eight) hours as needed for moderate pain or severe pain. 90 tablet 0  . hydrocortisone 2.5 % ointment Apply topically 2 (two) times daily as needed. 30 g 1  . LORazepam (ATIVAN) 1 MG tablet TAKE 1 TABLET BY MOUTH EVERY 8 HOURS AS NEEDED FOR ANXIETY 30 tablet 0  . magnesium oxide (MAG-OX) 400 MG tablet Take 1 tablet  (400 mg total) by mouth 2 (two) times daily. 30 tablet 11  . metoprolol succinate (TOPROL-XL) 50 MG 24 hr tablet Take 1 tablet (50 mg total) by mouth 2 (two) times daily. Take with or immediately following a meal. 180 tablet 3  . Multiple Vitamin (MULTIVITAMIN WITH MINERALS) TABS tablet Take 1 tablet by mouth daily.     . naproxen (NAPROSYN) 375 MG tablet Take 375 mg by mouth 2 (two) times daily as needed (knee pain.).     [name] Polyethyl Glycol-Propyl Glycol (SYSTANE OP) Place 1 drop into both eyes daily as needed (dry eyes).    . potassium chloride SA (KLOR-CON) 20 MEQ tablet Take 1 tablet (20 mEq total) by mouth as directed. 1 tablet daily 20 mEq by mouth as directed per alleviate research protocol. 180 tablet 1  . pravastatin (PRAVACHOL) 40 MG tablet Take 1 tablet (40 mg total) by mouth daily. 90 tablet 1  . tamsulosin (FLOMAX) 0.4 MG CAPS capsule Take 1 capsule by mouth once daily 30 capsule 0  . temazepam (RESTORIL) 30 MG capsule Take 1 capsule by mouth at bedtime 30 capsule 1  . isosorbide mononitrate (IMDUR) 30 MG 24 hr tablet Take 1 tablet (30 mg total) by mouth daily. 30 tablet 11   No facility-administered medications prior to visit.    Allergies  Allergen Reactions  . Sulfonamide Derivatives Other (See Comments)    Flu like symptoms   . Zolpidem Other (See Comments)    Excessive, prolonged sedation    Review of Systems  Constitutional: Negative for fever and malaise/fatigue.  HENT: Negative for congestion.   Eyes: Negative for blurred vision.  Respiratory: Negative for shortness of breath.   Cardiovascular: Negative for chest pain, palpitations and leg swelling.  Gastrointestinal: Negative for abdominal pain, blood in stool and nausea.  Genitourinary: Negative for dysuria and frequency.  Musculoskeletal: Negative for falls.  Skin: Negative for rash.  Neurological: Negative for dizziness, loss of consciousness and headaches.  Endo/Heme/Allergies: Negative for environmental  allergies.  Psychiatric/Behavioral: Negative for depression. The patient is not nervous/anxious.  Objective:    Physical Exam Vitals and nursing note reviewed.  Constitutional:      General: He is not in acute distress.    Appearance: He is well-developed and well-nourished.  HENT:     Head: Normocephalic and atraumatic.     Nose: Nose normal.  Eyes:     General:        Right eye: No discharge.        Left eye: No discharge.  Cardiovascular:     Rate and Rhythm: Normal rate and regular rhythm.     Heart sounds: Murmur heard.    Pulmonary:     Effort: Pulmonary effort is normal.     Breath sounds: Normal breath sounds.  Abdominal:     General: Bowel sounds are normal.     Palpations: Abdomen is soft.     Tenderness: There is no abdominal tenderness.  Musculoskeletal:        General: No edema.     Cervical back: Normal range of motion and neck supple.  Skin:    General: Skin is warm and dry.  Neurological:     Mental Status: He is alert and oriented to person, place, and time.  Psychiatric:        Mood and Affect: Mood and affect normal.     BP 108/64 (BP Location: Left Arm, Patient Position: Sitting, Cuff Size: Normal)   Pulse 100   Temp 97.7 F (36.5 C) (Oral)   Resp 16   Ht 5\' 11"  (1.803 m)   Wt 198 lb 8 oz (90 kg)   SpO2 98%   BMI 27.69 kg/m  Wt Readings from Last 3 Encounters:  05/22/20 198 lb 8 oz (90 kg)  04/21/20 194 lb (88 kg)  04/18/20 192 lb 6.4 oz (87.3 kg)    Diabetic Foot Exam - Simple   No data filed    Lab Results  Component Value Date   WBC 4.1 05/20/2020   HGB 15.1 05/20/2020   HCT 47.0 05/20/2020   PLT 197 05/20/2020   GLUCOSE 142 (H) 05/20/2020   CHOL 122 05/22/2020   TRIG 99.0 05/22/2020   HDL 37.50 (L) 05/22/2020   LDLDIRECT 84.0 05/02/2018   LDLCALC 65 05/22/2020   ALT 23 07/23/2019   AST 17 07/23/2019   NA 138 05/20/2020   K 4.4 05/20/2020   CL 102 05/20/2020   CREATININE 1.87 (H) 05/20/2020   BUN 33 (H)  05/20/2020   CO2 23 05/20/2020   TSH 1.93 05/22/2020   PSA 10.76 (H) 02/06/2019   INR 2.2 09/06/2018   HGBA1C 8.1 (A) 04/18/2020   MICROALBUR 1.1 04/14/2015    Lab Results  Component Value Date   TSH 1.93 05/22/2020   Lab Results  Component Value Date   WBC 4.1 05/20/2020   HGB 15.1 05/20/2020   HCT 47.0 05/20/2020   MCV 79 05/20/2020   PLT 197 05/20/2020   Lab Results  Component Value Date   NA 138 05/20/2020   K 4.4 05/20/2020   CO2 23 05/20/2020   GLUCOSE 142 (H) 05/20/2020   BUN 33 (H) 05/20/2020   CREATININE 1.87 (H) 05/20/2020   BILITOT 0.7 07/23/2019   ALKPHOS 56 07/23/2019   AST 17 07/23/2019   ALT 23 07/23/2019   PROT 6.8 07/23/2019   ALBUMIN 4.0 07/23/2019   CALCIUM 9.4 05/20/2020   ANIONGAP 10 11/26/2019   GFR 43.05 (L) 08/02/2019   Lab Results  Component Value Date   CHOL 122 05/22/2020  Lab Results  Component Value Date   HDL 37.50 (L) 05/22/2020   Lab Results  Component Value Date   LDLCALC 65 05/22/2020   Lab Results  Component Value Date   TRIG 99.0 05/22/2020   Lab Results  Component Value Date   CHOLHDL 3 05/22/2020   Lab Results  Component Value Date   HGBA1C 8.1 (A) 04/18/2020       Assessment & Plan:   Problem List Items Addressed This Visit    Diabetes mellitus type 2 in obese ([place])    hgba1c unacceptable at 8.1 but he is working on it and is eating better, minimize simple carbs. Increase exercise as tolerated. Continue current meds. Sugars at home doing better, 102 in am is the average. No sugars below 70 or above 200. Follows with Dr [name]. High dose flu shot given today.      Hereditary and idiopathic peripheral neuropathy    He notes increased imbetween toes and at balls of feet worse qhs and after a long day. Work on sugar. Try topical menthol or capsaicin or even lidocaine gel      Chronic combined systolic and diastolic heart failure ([place])    Following closely with cardiology and doing very well       Hyperlipidemia - Primary    Tolerating statin, encouraged heart healthy diet, avoid trans fats, minimize simple carbs and saturated fats. Increase exercise as tolerated      Relevant Orders   Lipid panel (Completed)   PAF (paroxysmal atrial fibrillation) (H[facility])   Relevant Orders   TSH (Completed)   Gout   Relevant Orders   Uric acid (Completed)   Essential hypertension, malignant    Well controlled, no changes to meds. Encouraged heart healthy diet such as the DASH diet and exercise as tolerated.       Insomnia    Encouraged good sleep hygiene such as dark, quiet room. No blue/green glowing lights such as computer screens in bedroom. No alcohol or stimulants in evening. Cut down on caffeine as able. Regular exercise is helpful but not just prior to bed time. Stable on current meds after failing several others. No changes      Chronic renal insufficiency    Hydrate and monitor      Atrial fibrillation ([place])    Underwent cardioversion in October and while it is better he still runs a bit high has an appt with cardiology in early 2022       Other Visit Diagnoses    Need for immunization against influenza       Relevant Orders   Flu Vaccine QUAD High Dose(Fluad) (Completed)      I am having [name] maintain his fish oil-omega-3 fatty acids, cetirizine, albuterol, HYDROcodone-acetaminophen, glucose blood, multivitamin with minerals, OneTouch Verio, naproxen, tamsulosin, magnesium oxide, Clobetasol Prop Emollient Base, hydrocortisone, glyBURIDE, dapagliflozin propanediol, potassium chloride SA, metoprolol succinate, Eliquis, calcium carbonate, Polyethyl Glycol-Propyl Glycol (SYSTANE OP), pravastatin, LORazepam, hydrALAZINE, colchicine, furosemide, temazepam, and allopurinol.  No orders of the defined types were placed in this encounter.    Penni Homans, MD

## 2020-05-23 NOTE — Assessment & Plan Note (Signed)
Tolerating statin, encouraged heart healthy diet, avoid trans fats, minimize simple carbs and saturated fats. Increase exercise as tolerated 

## 2020-05-23 NOTE — Assessment & Plan Note (Signed)
Encouraged good sleep hygiene such as dark, quiet room. No blue/green glowing lights such as computer screens in bedroom. No alcohol or stimulants in evening. Cut down on caffeine as able. Regular exercise is helpful but not just prior to bed time. Stable on current meds after failing several others. No changes

## 2020-06-08 ENCOUNTER — Other Ambulatory Visit: Payer: Self-pay | Admitting: Family Medicine

## 2020-06-09 ENCOUNTER — Other Ambulatory Visit: Payer: Self-pay | Admitting: Family Medicine

## 2020-06-09 NOTE — Telephone Encounter (Signed)
Requesting: temazepam 30mg  Contract: 10/21/2017  UDS: 08/03/2018 Last Visit: 05/22/2020  Next Visit: 09/09/2020 Last Refill: 03/17/2020 #30 and 0RF  Please Advise

## 2020-06-12 ENCOUNTER — Other Ambulatory Visit: Payer: Self-pay

## 2020-06-12 ENCOUNTER — Ambulatory Visit: Payer: Medicare HMO | Admitting: Cardiology

## 2020-06-12 ENCOUNTER — Other Ambulatory Visit: Payer: Self-pay | Admitting: Family Medicine

## 2020-06-12 ENCOUNTER — Encounter: Payer: Self-pay | Admitting: Cardiology

## 2020-06-12 VITALS — BP 102/68 | HR 92 | Ht 71.0 in | Wt 198.4 lb

## 2020-06-12 DIAGNOSIS — I484 Atypical atrial flutter: Secondary | ICD-10-CM

## 2020-06-12 NOTE — Patient Instructions (Signed)
Medication Instructions:  °Your physician recommends that you continue on your current medications as directed. Please refer to the Current Medication list given to you today. ° °*If you need a refill on your cardiac medications before your next appointment, please call your pharmacy* ° ° °Lab Work: °None ordered ° ° °Testing/Procedures: °None ordered ° ° °Follow-Up: °At [facility], you and your health needs are our priority.  As part of our continuing mission to provide you with exceptional heart care, we have created designated Provider Care Teams.  These Care Teams include your primary Cardiologist (physician) and Advanced Practice Providers (APPs -  Physician Assistants and Nurse Practitioners) who all work together to provide you with the care you need, when you need it. ° °Your next appointment:   °6 month(s) ° °The format for your next appointment:   °In Person ° °Provider:   °Will Camnitz, MD ° ° ° °Thank you for choosing [facility]!! ° ° °Soriya Worster, RN °[phone] °  °

## 2020-06-12 NOTE — Progress Notes (Signed)
Electrophysiology Office Note   Date:  06/12/2020   ID:  [name], [name] [birth year], MRN [id number]  PCP:  [name], MD  Cardiologist:  Croit[place] Primary Electrophysiologist:  Kamariah Fruchter Meredith [name], MD    No chief complaint on file.    History of Present Illness: [name] is a 70 y.o. male who is being seen today for the evaluation of atrial fibrillation at the request of Mihai Croitrou. Presenting today for electrophysiology evaluation.    He has a history significant for atrial fibrillation, atrial flutter, aortic insufficiency, hypertension, OSA, hyperlipidemia, type 2 diabetes, diastolic heart failure.  He had cardioversion 02/27/2019.  He was in sinus rhythm for a short period of time.  He developed NYHA class II-III exertional fatigue.  He had been compliant with his CPAP.  ECG showed atrial flutter.  He had an ablation 05/16/2019.  He was in acute systolic heart failure at the time of ablation and required prolonged hospital stay postop.  He unfortunately went back into atrial fibrillation.  He is status post repeat ablation 03/10/2020.  During that procedure, it was found that his pulmonary veins and posterior wall remained isolated.  He had ablation of the anterior left atrium.  Today, denies symptoms of palpitations, chest pain, shortness of breath, orthopnea, PND, lower extremity edema, claudication, dizziness, presyncope, syncope, bleeding, or neurologic sequela. The patient is tolerating medications without difficulties.  Currently feels well.  He has no chest pain or shortness of breath.  He checks his pulse at home and at times his pulse is in the low one hundreds.  He is unaware of rapid heart rates.  Past Medical History:  Diagnosis Date  . AORTIC STENOSIS, MODERATE 12/05/2009  . Atrial fibrillation ([place])   . Atrial fibrillation with RVR ([place]) 07/05/2017  . Atrial flutter ([place]) 12/05/2009  . Atypical chest pain 11/08/2011  . BENIGN PROSTATIC HYPERTROPHY, HX OF  12/05/2009  . CHEST PAIN-UNSPECIFIED 11/11/2009  . CHF 12/05/2009  . Chronic renal insufficiency 12/16/2016  . Debility 04/18/2012  . Diarrhea 12/16/2016  . DYSPNEA 12/19/2009  . Edema 04/18/2012  . ERECTILE DYSFUNCTION 01/25/2007  . FATIGUE, CHRONIC 11/11/2009  . Gout 07/10/2012  . Heart murmur   . History of kidney stones    "they passed" (07/05/2017)  . Hyperkalemia 04/18/2012  . Hyperlipidemia   . Hypertension   . HYPERTENSION 01/25/2007  . Hypoxia 05/05/2011  . Insomnia 05/13/2016  . INSOMNIA, HX OF 01/25/2007  . Knee pain, bilateral 12/28/2010  . Knee pain, right 12/28/2010  . NEUROMA 01/05/2010  . OBESITY NOS 01/25/2007  . OSA on CPAP 04/06/2010  . PERIPHERAL NEUROPATHY 01/05/2010  . PULMONARY FUNCTION TESTS, ABNORMAL 02/02/2010  . Superficial thrombophlebitis of left leg 09/07/2011  . TESTICULAR HYPOFUNCTION 01/05/2010  . 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 Toe pain, right 07/10/2012  . Type II diabetes mellitus ([place]) 12/05/2009  . UNSPECIFIED ANEMIA 01/05/2010   Past Surgical History:  Procedure Laterality Date  . A-FLUTTER ABLATION N/A 05/16/2019   Procedure: A-FLUTTER ABLATION;  Surgeon: Constance Haw, MD;  Location: [place] [facility];  Service: Cardiovascular;  Laterality: N/A;  . ATRIAL FIBRILLATION ABLATION N/A 05/16/2019   Procedure: ATRIAL FIBRILLATION ABLATION;  Surgeon: Constance Haw, MD;  Location: [place] [facility];  Service: Cardiovascular;  Laterality: N/A;  . ATRIAL FIBRILLATION ABLATION N/A 03/10/2020   Procedure: ATRIAL FIBRILLATION ABLATION;  Surgeon: Constance Haw, MD;  Location: [place] [facility];  Service: Cardiovascular;  Laterality: N/A;  .  CARDIAC CATHETERIZATION  10/16/2009   nonischemic cardiomyopathy  . CARDIOVERSION N/A 03/25/2015   Procedure: CARDIOVERSION;  Surgeon: Chrystie Nose, MD;  Location: [facility] ENDOSCOPY;  Service: Cardiovascular;  Laterality: N/A;  . CARDIOVERSION N/A 06/17/2017   Procedure: CARDIOVERSION;  Surgeon: Thurmon Fair, MD;  Location:  [facility] ENDOSCOPY;  Service: Cardiovascular;  Laterality: N/A;  . CARDIOVERSION N/A 07/08/2017   Procedure: CARDIOVERSION;  Surgeon: Pricilla Riffle, MD;  Location: [facility] ENDOSCOPY;  Service: Cardiovascular;  Laterality: N/A;  . CARDIOVERSION N/A 02/27/2019   Procedure: CARDIOVERSION;  Surgeon: Thurmon Fair, MD;  Location: [facility] ENDOSCOPY;  Service: Cardiovascular;  Laterality: N/A;  . CARDIOVERSION N/A 08/16/2019   Procedure: CARDIOVERSION;  Surgeon: Thurmon Fair, MD;  Location: [facility] ENDOSCOPY;  Service: Cardiovascular;  Laterality: N/A;  . METATARSAL OSTEOTOMY Bilateral ~ 1980   removed part of 5th metatarsal to corect curvature of toes   . RIGHT HEART CATH N/A 05/16/2019   Procedure: RIGHT HEART CATH;  Surgeon: Dolores Patty, MD;  Location: [facility] INVASIVE CV LAB;  Service: Cardiovascular;  Laterality: N/A;  . TEE WITHOUT CARDIOVERSION  05/16/2019   Procedure: Transesophageal Echocardiogram (Tee);  Surgeon: Regan Lemming, MD;  Location: [facility] INVAS[facility] CV LAB;  Service: Cardiovascular;;  . TEE WITHOUT CARDIOVERSION N/A 03/07/2020   Procedure: TRANSESOPHAGEAL ECHOCARDIOGRAM (TEE);  Surgeon: Meriam Sprague, MD;  Location: [facility] ENDOSCOPY;  Service: Cardiovascular;  Laterality: N/A;     Current Outpatient Medications  Medication Sig Dispense Refill  . albuterol (VENTOLIN HFA) 108 (90 Base) MCG/ACT inhaler Inhale 2 puffs into the lungs every 6 (six) hours as needed for wheezing or shortness of breath. 1 Inhaler 6  . allopurinol (ZYLOPRIM) 300 MG tablet Take 1 tablet (300 mg total) by mouth daily. 90 tablet 0  . Blood Glucose Monitoring Suppl (ONETOUCH VERIO) w/Device KIT Use to check blood sugar TID as directed.  Dx Code: E11.9 1 kit 0  . calcium carbonate (TUMS - DOSED IN MG ELEMENTAL CALCIUM) 500 MG chewable tablet Chew 500 mg by mouth daily as needed for indigestion or heartburn.    . cetirizine (ZYRTEC) 10 MG tablet Take 1 tablet (10 mg total) by mouth daily. 30 tablet 11  . Clobetasol Prop  Emollient Base (CLOBETASOL PROPIONATE E) 0.05 % emollient cream Apply 1 application topically 2 (two) times daily as needed (insect bite). 60 g 1  . colchicine (COLCRYS) 0.6 MG tablet Take one tablet for 4-6 days as needed for gout flare up. 15 tablet 3  . dapagliflozin propanediol (FARXIGA) 10 MG TABS tablet Take 1 tablet (10 mg total) by mouth daily before breakfast. 30 tablet 6  . ELIQUIS 5 MG TABS tablet Take 1 tablet by mouth twice daily 180 tablet 1  . fish oil-omega-3 fatty acids 1000 MG capsule Take 1 g by mouth daily.    . furosemide (LASIX) 80 MG tablet Take 1 tablet (80 mg total) by mouth daily. As needed patient may take an additional 40 MG tablet as direct per Alleviate Research study PRN plan. 30 tablet 6  . glucose blood test strip (Verio) Use as instructed Check blood sugars TID DxE11.9 100 each 12  . glyBURIDE (DIABETA) 5 MG tablet Take 1 tablet by mouth daily in the morning and 1/2 tablet by mouth every evening 135 tablet 1  . hydrALAZINE (APRESOLINE) 25 MG tablet Take 1 tablet (25 mg total) by mouth 3 (three) times daily. 270 tablet 2  . HYDROcodone-acetaminophen (NORCO) 10-325 MG tablet Take 1 tablet by mouth every 8 (eight)  hours as needed for moderate pain or severe pain. 90 tablet 0  . hydrocortisone 2.5 % ointment Apply topically 2 (two) times daily as needed. 30 g 1  . isosorbide mononitrate (IMDUR) 30 MG 24 hr tablet Take 1 tablet by mouth once daily 90 tablet 0  . LORazepam (ATIVAN) 1 MG tablet TAKE 1 TABLET BY MOUTH EVERY 8 HOURS AS NEEDED FOR ANXIETY 30 tablet 0  . magnesium oxide (MAG-OX) 400 MG tablet Take 1 tablet (400 mg total) by mouth 2 (two) times daily. 30 tablet 11  . metoprolol succinate (TOPROL-XL) 50 MG 24 hr tablet Take 1 tablet (50 mg total) by mouth 2 (two) times daily. Take with or immediately following a meal. 180 tablet 3  . Multiple Vitamin (MULTIVITAMIN WITH MINERALS) TABS tablet Take 1 tablet by mouth daily.     . naproxen (NAPROSYN) 375 MG tablet  Take 375 mg by mouth 2 (two) times daily as needed (knee pain.).     [name] Polyethyl Glycol-Propyl Glycol (SYSTANE OP) Place 1 drop into both eyes daily as needed (dry eyes).    . potassium chloride SA (KLOR-CON) 20 MEQ tablet Take 1 tablet (20 mEq total) by mouth as directed. 1 tablet daily 20 mEq by mouth as directed per alleviate research protocol. 180 tablet 1  . pravastatin (PRAVACHOL) 40 MG tablet Take 1 tablet (40 mg total) by mouth daily. 90 tablet 1  . tamsulosin (FLOMAX) 0.4 MG CAPS capsule Take 1 capsule by mouth once daily 30 capsule 0  . temazepam (RESTORIL) 30 MG capsule Take 1 capsule by mouth at bedtime 30 capsule 5   No current facility-administered medications for this visit.    Allergies:   Sulfonamide derivatives and Zolpidem   Social History:  The patient  reports that he quit smoking about 40 years ago. He has a 10.00 pack-year smoking history. He has never used smokeless tobacco. He reports that he does not drink alcohol and does not use drugs.   Family History:  The patient's family history includes Aneurysm in his sister; Cancer in his father; Clotting disorder in his mother; Diabetes in his mother and sister; Heart attack in his mother; Heart disease in his mother; Hyperlipidemia in his mother; Hypertension in his mother and sister; Leukemia in his maternal grandmother; Lung disease in his father; Other in his sister; Seizures in his sister; Stroke in his mother.   ROS:  Please see the history of present illness.   Otherwise, review of systems is positive for none.   All other systems are reviewed and negative.   PHYSICAL EXAM: VS:  BP 102/68   Pulse 92   Ht $R'5\' 11"'It$  (1.803 m)   Wt 198 lb 6.4 oz (90 kg)   SpO2 97%   BMI 27.67 kg/m  , BMI Body mass index is 27.67 kg/m. GEN: Well nourished, well developed, in no acute distress  HEENT: normal  Neck: no JVD, carotid bruits, or masses Cardiac: Irregular; no murmurs, rubs, or gallops,no edema  Respiratory:  clear to  auscultation bilaterally, normal work of breathing GI: soft, nontender, nondistended, + BS MS: no deformity or atrophy  Skin: warm and dry, device site well healed Neuro:  Strength and sensation are intact Psych: euthymic mood, full affect  EKG:  EKG is ordered today. Personal review of the ekg ordered shows atrial flutter, rate 93  Personal review of the device interrogation today. Results in [place]: 07/23/2019: ALT 23 08/02/2019: Pro B Natriuretic peptide (BNP)  446.0 05/20/2020: BNP 353.1; BUN 33; Creatinine, Ser 1.87; Hemoglobin 15.1; Platelets 197; Potassium 4.4; Sodium 138 05/22/2020: TSH 1.93    Lipid Panel     Component Value Date/Time   CHOL 122 05/22/2020 0933   TRIG 99.0 05/22/2020 0933   HDL 37.50 (L) 05/22/2020 0933   CHOLHDL 3 05/22/2020 0933   VLDL 19.8 05/22/2020 0933   LDLCALC 65 05/22/2020 0933   LDLDIRECT 84.0 05/02/2018 1523     Wt Readings from Last 3 Encounters:  06/12/20 198 lb 6.4 oz (90 kg)  05/22/20 198 lb 8 oz (90 kg)  04/21/20 194 lb (88 kg)      Other studies Reviewed: Additional studies/ records that were reviewed today include: TTE 12/12/2019 Review of the above records today demonstrates:  1. Left ventricular ejection fraction, by estimation, is 35 to 40%. The  left ventricle has moderately decreased function. The left ventricle  demonstrates global hypokinesis. Indeterminate diastolic filling due to  E-A fusion.  2. Right ventricular systolic function is mildly reduced. The right  ventricular size is mildly enlarged. There is moderately elevated  pulmonary artery systolic pressure. The estimated right ventricular  systolic pressure is 25.4 mmHg.  3. Left atrial size was moderately dilated.  4. The mitral valve is grossly normal. Mild mitral valve regurgitation.  No evidence of mitral stenosis.  5. The aortic valve is tricuspid. Aortic valve regurgitation is mild to  moderate. No aortic stenosis is present.  6.  Pulmonic valve regurgitation is moderate.  7. Aortic dilatation noted. Aneurysm of the aortic root, measuring 45 mm.  8. The inferior vena cava is normal in size with greater than 50%  respiratory variability, suggesting right atrial pressure of 3 mmHg.   ASSESSMENT AND PLAN:  1.  Persistent atrial fibrillation/flutter: Currently on Eliquis with a CHA2DS2-VASc 3.  He had a prior AF ablation and went into systolic heart failure after the procedure.  He came back in atrial flutter and is now status post repeat ablation 03/10/2020.  Unfortunately he has had more frequent episodes of atrial fibrillation.  He is minimally symptomatic.  At this point, we Jenisse Vullo plan for a rate control strategy.  During his repeat ablation, his pulmonary veins in his posterior wall were isolated, and he had further ablation of his anterior left atrium.  At this point, since he is feeling well, we Zakara Parkey make no further changes.  2.  Chronic systolic heart failure due to nonischemic cardiomyopathy: Ejection fraction improved to 35 to 40%.  Has remained stable over several echoes.  Currently on Toprol-XL, Imdur, hydralazine.  No changes.   3.  Hypertension: Currently well controlled   Current medicines are reviewed at length with the patient today.   The patient does not have concerns regarding his medicines.  The following changes were made today: None  Labs/ tests ordered today include:  Orders Placed This Encounter  Procedures  . EKG 12-Lead     Disposition:   FU with Marquies Wanat 6 months  Signed, Gid Schoffstall Meredith [name], MD  06/12/2020 2:06 PM     [place] [address] [phone] (office) [phone] (fax)

## 2020-06-14 DIAGNOSIS — G4733 Obstructive sleep apnea (adult) (pediatric): Secondary | ICD-10-CM | POA: Diagnosis not present

## 2020-06-17 ENCOUNTER — Telehealth: Payer: Self-pay | Admitting: Internal Medicine

## 2020-06-17 NOTE — Telephone Encounter (Signed)
Patient called to request a new RX for   [facility] VERIO test strip -Patient states he tests 3 times daily Be sent to  [place], [place] Phone:  [phone]  Fax:  [phone]

## 2020-06-18 NOTE — Telephone Encounter (Signed)
Erx was sent on 06/12/20 by [facility]

## 2020-06-20 ENCOUNTER — Telehealth: Payer: Self-pay | Admitting: Internal Medicine

## 2020-06-20 DIAGNOSIS — E669 Obesity, unspecified: Secondary | ICD-10-CM

## 2020-06-20 DIAGNOSIS — E1169 Type 2 diabetes mellitus with other specified complication: Secondary | ICD-10-CM

## 2020-06-20 MED ORDER — BLOOD GLUCOSE METER KIT: PACK | 0 refills | Status: DC

## 2020-06-20 NOTE — Telephone Encounter (Signed)
Rx sent to pt preferred pharmacy

## 2020-06-20 NOTE — Telephone Encounter (Signed)
Patient's insurance does not cover one touch - can we please call in accu-chek meter and all the supplies as soon as possible?  [facility] [place] [address], [place]  [address] [place] [address]  Phone:  [phone] Fax:  [phone]

## 2020-06-24 ENCOUNTER — Other Ambulatory Visit: Payer: Self-pay

## 2020-06-24 ENCOUNTER — Telehealth: Payer: Self-pay

## 2020-06-24 DIAGNOSIS — Z006 Encounter for examination for normal comparison and control in clinical research program: Secondary | ICD-10-CM

## 2020-06-24 MED ORDER — ONETOUCH VERIO W/DEVICE KIT: PACK | 0 refills | Status: DC

## 2020-06-24 NOTE — Research (Signed)
Alleviate HF Research Study  [facility]     

## 2020-06-24 NOTE — Telephone Encounter (Signed)
One Touch brand not covered by insurance. Unsure which brand is preferred. Recommend Pt to call insurance directly to find out.

## 2020-06-25 ENCOUNTER — Other Ambulatory Visit: Payer: Self-pay | Admitting: *Deleted

## 2020-06-25 MED ORDER — TRUE METRIX AIR GLUCOSE METER W/DEVICE KIT: PACK | 0 refills | Status: DC

## 2020-06-25 MED ORDER — TRUEPLUS LANCETS 33G MISC: 1 refills | Status: DC

## 2020-06-25 MED ORDER — TRUE METRIX BLOOD GLUCOSE TEST VI STRP: ORAL_STRIP | 1 refills | Status: DC

## 2020-06-25 NOTE — Telephone Encounter (Signed)
Spoke with patient and he stated that he spoke with [facility] but he did not remember the brand of testing supplies he needed because they were going to send [place] a message.   I only got a message from [facility] and not [facility].  Looks like Mcarthur Rossetti will pay for true metrix supplies.  rx for supplies sent to [facility], because patient needed now.  He will follow back up with [place] if he has any problems.   Will send to [facility] for next refill.

## 2020-06-29 ENCOUNTER — Other Ambulatory Visit (HOSPITAL_COMMUNITY): Payer: Self-pay | Admitting: Internal Medicine

## 2020-06-29 ENCOUNTER — Other Ambulatory Visit: Payer: Self-pay | Admitting: Family Medicine

## 2020-06-30 MED ORDER — DAPAGLIFLOZIN PROPANEDIOL 10 MG PO TABS: 10.0000 mg | ORAL_TABLET | Freq: Every day | ORAL | 0 refills | Status: DC

## 2020-06-30 NOTE — Addendum Note (Signed)
Addended byShela Nevin R on: 2/35/3614 03:24 PM   Modules accepted: Orders

## 2020-06-30 NOTE — Telephone Encounter (Signed)
Requesting: lorazepam 1mg  Contract: 10/21/2017  UDS: 08/03/2018 Last Visit: 05/22/2020 Next Visit: 09/09/2020 Last Refill: 03/17/2020 #30 and 0RF  Please Advise

## 2020-07-01 ENCOUNTER — Other Ambulatory Visit: Payer: Self-pay | Admitting: *Deleted

## 2020-07-01 DIAGNOSIS — E1169 Type 2 diabetes mellitus with other specified complication: Secondary | ICD-10-CM

## 2020-07-01 DIAGNOSIS — E669 Obesity, unspecified: Secondary | ICD-10-CM

## 2020-07-01 MED ORDER — ALCOHOL SWABS 70 % PADS: MEDICATED_PAD | 1 refills | Status: DC

## 2020-07-08 DIAGNOSIS — N1832 Chronic kidney disease, stage 3b: Secondary | ICD-10-CM | POA: Diagnosis not present

## 2020-07-08 DIAGNOSIS — J449 Chronic obstructive pulmonary disease, unspecified: Secondary | ICD-10-CM | POA: Diagnosis not present

## 2020-07-08 DIAGNOSIS — N2581 Secondary hyperparathyroidism of renal origin: Secondary | ICD-10-CM | POA: Diagnosis not present

## 2020-07-08 DIAGNOSIS — E1122 Type 2 diabetes mellitus with diabetic chronic kidney disease: Secondary | ICD-10-CM | POA: Diagnosis not present

## 2020-07-08 DIAGNOSIS — I129 Hypertensive chronic kidney disease with stage 1 through stage 4 chronic kidney disease, or unspecified chronic kidney disease: Secondary | ICD-10-CM | POA: Diagnosis not present

## 2020-07-09 ENCOUNTER — Encounter: Payer: Self-pay | Admitting: Cardiovascular Disease

## 2020-07-09 ENCOUNTER — Other Ambulatory Visit: Payer: Self-pay

## 2020-07-09 ENCOUNTER — Ambulatory Visit: Payer: Medicare HMO | Admitting: Cardiovascular Disease

## 2020-07-09 VITALS — BP 100/60 | HR 87 | Ht 71.0 in | Wt 198.0 lb

## 2020-07-09 DIAGNOSIS — E1142 Type 2 diabetes mellitus with diabetic polyneuropathy: Secondary | ICD-10-CM

## 2020-07-09 DIAGNOSIS — Z7901 Long term (current) use of anticoagulants: Secondary | ICD-10-CM | POA: Diagnosis not present

## 2020-07-09 DIAGNOSIS — E663 Overweight: Secondary | ICD-10-CM | POA: Diagnosis not present

## 2020-07-09 DIAGNOSIS — I5042 Chronic combined systolic (congestive) and diastolic (congestive) heart failure: Secondary | ICD-10-CM

## 2020-07-09 DIAGNOSIS — I484 Atypical atrial flutter: Secondary | ICD-10-CM | POA: Diagnosis not present

## 2020-07-09 DIAGNOSIS — G4733 Obstructive sleep apnea (adult) (pediatric): Secondary | ICD-10-CM | POA: Diagnosis not present

## 2020-07-09 DIAGNOSIS — N1832 Chronic kidney disease, stage 3b: Secondary | ICD-10-CM

## 2020-07-09 DIAGNOSIS — I10 Essential (primary) hypertension: Secondary | ICD-10-CM | POA: Diagnosis not present

## 2020-07-09 DIAGNOSIS — I351 Nonrheumatic aortic (valve) insufficiency: Secondary | ICD-10-CM

## 2020-07-09 DIAGNOSIS — Z95818 Presence of other cardiac implants and grafts: Secondary | ICD-10-CM

## 2020-07-09 MED ORDER — GABAPENTIN 100 MG PO CAPS: 100.0000 mg | ORAL_CAPSULE | Freq: Every day | ORAL | 3 refills | Status: DC

## 2020-07-09 MED ORDER — FUROSEMIDE 80 MG PO TABS: 80.0000 mg | ORAL_TABLET | Freq: Every day | ORAL | 3 refills | Status: DC

## 2020-07-09 NOTE — Patient Instructions (Signed)
Medication Instructions:  START Gabapentin 100 mg once daily at bedtime  *If you need a refill on your cardiac medications before your next appointment, please call your pharmacy*   Lab Work: None ordered If you have labs (blood work) drawn today and your tests are completely normal, you will receive your results only by: [name] MyChart Message (if you have MyChart) OR . A paper copy in the mail If you have any lab test that is abnormal or we need to change your treatment, we will call you to review the results.   Testing/Procedures: None ordered   Follow-Up: At [facility], you and your health needs are our priority.  As part of our continuing mission to provide you with exceptional heart care, we have created designated Provider Care Teams.  These Care Teams include your primary Cardiologist (physician) and Advanced Practice Providers (APPs -  Physician Assistants and Nurse Practitioners) who all work together to provide you with the care you need, when you need it.  We recommend signing up for the patient portal called "MyChart".  Sign up information is provided on this After Visit Summary.  MyChart is used to connect with patients for Virtual Visits (Telemedicine).  Patients are able to view lab/test results, encounter notes, upcoming appointments, etc.  Non-urgent messages can be sent to your provider as well.   To learn more about what you can do with MyChart, go to [email or web address].    Your next appointment:   6 month(s)  The format for your next appointment:   In Person  Provider:   Sanda Klein, MD

## 2020-07-13 NOTE — Progress Notes (Signed)
Cardiology Office Note    Date:  07/13/2020   ID:  Tobin, Witucki [birth year], MRN [id number]  PCP:  [name], MD  Cardiologist:   Sanda Klein, MD   Chief Complaint  Patient presents with  . Follow-up    Follow-up    History of Present Illness:  [name] is a 70 y.o. male with aortic insufficiency, combined systolic and diastolic heart failure, paroxysmal atrial fibrillation and atrial flutter, obstructive sleep apnea, essential hypertension dyslipidemia in the setting of obesity and type 2 diabetes mellitus.  He returns in follow-up after undergoing cardioversion on March 11.  He is generally doing quite well.  Continues to exercise regularly at home.  Denies any problems with leg edema, orthopnea, PND or change in his exercise stamina (NYHA functional class II). He remains oblivious to arrhythmia, he has never been aware of the palpitations.  Denies falls, injuries or serious bleeding problems.  Reports 100% compliance with CPAP and denies daytime hypersomnolence.  His biggest complaint is neuropathic pain in his feet at night, often keeps him up.  Glycemic control has deteriorated a little bit with the most recent A1c 8.1%.  Creatinine is stable at around 1.9.  He very rarely takes naproxen (less than once a month) for knee pain.  Appears to be in persistent atrial flutter and is reluctant to undergo any further ablation procedures or cardioversion.  Rate control has been much easier since his last ablation procedure, 03/10/2020  Follow-up echocardiogram performed on July 20, 2019 showed EF back to "baseline" 35-40%.  The aortic insufficiency was described as mild to moderate, ascending aorta 43 mm.  The left atrium was severely dilated.  The left ventricle is normal in size with mild concentric hypertrophy.  TEE performed just before his last ablation procedure on 03/07/2020 showed a similar EF of 35-40%.  The left atrium severely dilated, without thrombus.   There was only mild mitral regurgitation.  Aortic regurgitation was also described as mild.  The aortic root was mildly dilated at 41 mm.     He was very ill at the end of 2020 with severe tachycardia related cardiomyopathy, severely depressed left ventricular systolic function in the setting of atrial flutter with rapid ventricular rates.  He underwent ablation with Will Camnitz, transition from dofetilide to amiodarone and then was hospitalized for optimization of his heart failure.  A loop recorder was implanted as part of a CHF trial.  He has never really been aware of the palpitations, so episodes of breakthrough arrhythmia have often gone undetected in the past.  Because of a 2-week episode of persistent atrial fibrillation detected by his loop recorder, he underwent cardioversion successfully on March 11.  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  Most recently in December 2020 he had cardiogenic shock and an EF of 10%, underwent pulmonary vein isolation and had a prolonged hospitalization on pressors.  Follow-up echo in February 2021 showed EF of 35-40%.  Prior to that in January 2019 his ejection fraction dropped to 30-35% when he was again unaware of the persistent arrhythmia.  He underwent cardioversion and ranolazine was added to dofetilide with a return to sinus rhythm, improved functional status and improvement in left ventricular ejection fraction to 42% by echo performed in April 2019  In early 2020 he had an upper respiratory infection from which he never felt that he fully rebounded.  He noticed persistent tachycardia throughout the summer 2020 he underwent successful  cardioversion on February 27, 2019 but had early recurrence of atrial fibrillation despite increased dose of dofetilide.  His clinical status steadily deteriorated.  On May 16, 2019 TEE showed LVEF was down to 10%, with severe biatrial dilation and moderate aortic insufficiency,  mild mitral insufficiency.  The same day he underwent A. fib/a flutter ablation by Dr. Curt Bears and a right heart catheterization by Dr. Haroldine Laws (RA pressure 15, PA pressure 49/32, wedge pressure mean 27, cardiac index 2.8).  Follow-up echocardiogram in February 2021 showed EF of 35-40%.  He did have another cardioversion in early March for persistent atrial flutter.  His echocardiograms have shown moderate aortic insufficiency related to aortic ectasia.  He has a long-standing history of severe hypertension. He also has long-standing history of obstructive sleep apnea which was also not treated until recently, but he is now 100% CPAP-compliant. Coronary angiography 2011 did not show evidence of significant stenoses. He presented with typical atrial flutter in 2011 and has recurrent paroxysmal atrial fibrillation with a good response to treatment with dofetilide, but the dose of dofetilide was decreased when his renal function deteriorated in 2020. Additional problems include obesity, type 2 diabetes mellitus, gout and androgen deficiency. He had a successful cardioversion in October 2016 and February 2019 for persistent atrial fibrillation.   Past Medical History:  Diagnosis Date  . AORTIC STENOSIS, MODERATE 12/05/2009  . Atrial fibrillation ([place])   . Atrial fibrillation with RVR ([place]) 07/05/2017  . Atrial flutter ([place]) 12/05/2009  . Atypical chest pain 11/08/2011  . BENIGN PROSTATIC HYPERTROPHY, HX OF 12/05/2009  . CHEST PAIN-UNSPECIFIED 11/11/2009  . CHF 12/05/2009  . Chronic renal insufficiency 12/16/2016  . Debility 04/18/2012  . Diarrhea 12/16/2016  . DYSPNEA 12/19/2009  . Edema 04/18/2012  . ERECTILE DYSFUNCTION 01/25/2007  . FATIGUE, CHRONIC 11/11/2009  . Gout 07/10/2012  . Heart murmur   . History of kidney stones    "they passed" (07/05/2017)  . Hyperkalemia 04/18/2012  . Hyperlipidemia   . Hypertension   . HYPERTENSION 01/25/2007  . Hypoxia 05/05/2011  . Insomnia 05/13/2016  . INSOMNIA, HX OF  01/25/2007  . Knee pain, bilateral 12/28/2010  . Knee pain, right 12/28/2010  . NEUROMA 01/05/2010  . OBESITY NOS 01/25/2007  . OSA on CPAP 04/06/2010  . PERIPHERAL NEUROPATHY 01/05/2010  . PULMONARY FUNCTION TESTS, ABNORMAL 02/02/2010  . Superficial thrombophlebitis of left leg 09/07/2011  . TESTICULAR HYPOFUNCTION 01/05/2010  . 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 Toe pain, right 07/10/2012  . Type II diabetes mellitus ([place]) 12/05/2009  . UNSPECIFIED ANEMIA 01/05/2010    Past Surgical History:  Procedure Laterality Date  . A-FLUTTER ABLATION N/A 05/16/2019   Procedure: A-FLUTTER ABLATION;  Surgeon: Constance Haw, MD;  Location: [place] [facility];  Service: Cardiovascular;  Laterality: N/A;  . ATRIAL FIBRILLATION ABLATION N/A 05/16/2019   Procedure: ATRIAL FIBRILLATION ABLATION;  Surgeon: Constance Haw, MD;  Location: [place] [facility];  Service: Cardiovascular;  Laterality: N/A;  . ATRIAL FIBRILLATION ABLATION N/A 03/10/2020   Procedure: ATRIAL FIBRILLATION ABLATION;  Surgeon: Constance Haw, MD;  Location: [place] [facility];  Service: Cardiovascular;  Laterality: N/A;  . CARDIAC CATHETERIZATION  10/16/2009   nonischemic cardiomyopathy  . CARDIOVERSION N/A 03/25/2015   Procedure: CARDIOVERSION;  Surgeon: Pixie Casino, MD;  Location: [facility] ENDOSCOPY;  Service: Cardiovascular;  Laterality: N/A;  . CARDIOVERSION N/A 06/17/2017   Procedure: CARDIOVERSION;  Surgeon: Sanda Klein, MD;  Location: [facility] ENDOSCOPY;  Service: Cardiovascular;  Laterality: N/A;  . CARDIOVERSION  N/A 07/08/2017   Procedure: CARDIOVERSION;  Surgeon: Fay Records, MD;  Location: [facility] ENDOSCOPY;  Service: Cardiovascular;  Laterality: N/A;  . CARDIOVERSION N/A 02/27/2019   Procedure: CARDIOVERSION;  Surgeon: Sanda Klein, MD;  Location: [place] ENDOSCOPY;  Service: Cardiovascular;  Laterality: N/A;  . CARDIOVERSION N/A 08/16/2019   Procedure: CARDIOVERSION;  Surgeon: Sanda Klein, MD;  Location: [facility] ENDOSCOPY;  Service:  Cardiovascular;  Laterality: N/A;  . METATARSAL OSTEOTOMY Bilateral ~ 1980   removed part of 5th metatarsal to corect curvature of toes   . RIGHT HEART CATH N/A 05/16/2019   Procedure: RIGHT HEART CATH;  Surgeon: Jolaine Artist, MD;  Location: [place] [facility];  Service: Cardiovascular;  Laterality: N/A;  . TEE WITHOUT CARDIOVERSION  05/16/2019   Procedure: Transesophageal Echocardiogram (Tee);  Surgeon: Constance Haw, MD;  Location: [place] [facility];  Service: Cardiovascular;;  . TEE WITHOUT CARDIOVERSION N/A 03/07/2020   Procedure: TRANSESOPHAGEAL ECHOCARDIOGRAM (TEE);  Surgeon: Freada Bergeron, MD;  Location[facility] ENDOSCOPY;  Service: Cardiovascular;  Laterality: N/A;    Current Medications: Outpatient Medications Prior to Visit  Medication Sig Dispense Refill  . albuterol (VENTOLIN HFA) 108 (90 Base) MCG/ACT inhaler Inhale 2 puffs into the lungs every 6 (six) hours as needed for wheezing or shortness of breath. 1 Inhaler 6  . Alcohol Swabs 70 % PADS Use as directed 200 each 1  . allopurinol (ZYLOPRIM) 300 MG tablet Take 1 tablet (300 mg total) by mouth daily. 90 tablet 0  . Blood Glucose Monitoring Suppl (TRUE METRIX AIR GLUCOSE METER) w/Device KIT Use to check blood sugar 3 times a day.  Dx code: E11.9 1 kit 0  . calcium carbonate (TUMS - DOSED IN MG ELEMENTAL CALCIUM) 500 MG chewable tablet Chew 500 mg by mouth daily as needed for indigestion or heartburn.    . cetirizine (ZYRTEC) 10 MG tablet Take 1 tablet (10 mg total) by mouth daily. 30 tablet 11  . Clobetasol Prop Emollient Base (CLOBETASOL PROPIONATE E) 0.05 % emollient cream Apply 1 application topically 2 (two) times daily as needed (insect bite). 60 g 1  . colchicine (COLCRYS) 0.6 MG tablet Take one tablet for 4-6 days as needed for gout flare up. 15 tablet 3  . dapagliflozin propanediol (FARXIGA) 10 MG TABS tablet Take 1 tablet (10 mg total) by mouth daily. Needs appointment for further refill. 30 tablet 0  .  ELIQUIS 5 MG TABS tablet Take 1 tablet by mouth twice daily 180 tablet 1  . fish oil-omega-3 fatty acids 1000 MG capsule Take 1 g by mouth daily.    [name] glucose blood (TRUE METRIX BLOOD GLUCOSE TEST) test strip Use to check blood sugar 3 times a day.  Dx code: E11.9 300 each 1  . glyBURIDE (DIABETA) 5 MG tablet Take 1 tablet by mouth daily in the morning and 1/2 tablet by mouth every evening 135 tablet 1  . hydrALAZINE (APRESOLINE) 25 MG tablet Take 1 tablet (25 mg total) by mouth 3 (three) times daily. 270 tablet 2  . HYDROcodone-acetaminophen (NORCO) 10-325 MG tablet Take 1 tablet by mouth every 8 (eight) hours as needed for moderate pain or severe pain. 90 tablet 0  . hydrocortisone 2.5 % ointment Apply topically 2 (two) times daily as needed. 30 g 1  . isosorbide mononitrate (IMDUR) 30 MG 24 hr tablet Take 1 tablet by mouth once daily 90 tablet 0  . LORazepam (ATIVAN) 1 MG tablet TAKE 1 TABLET BY MOUTH EVERY 8 HOURS AS  NEEDED FOR ANXIETY 30 tablet 0  . magnesium oxide (MAG-OX) 400 MG tablet Take 1 tablet (400 mg total) by mouth 2 (two) times daily. 30 tablet 11  . metoprolol succinate (TOPROL-XL) 50 MG 24 hr tablet Take 1 tablet (50 mg total) by mouth 2 (two) times daily. Take with or immediately following a meal. 180 tablet 3  . Multiple Vitamin (MULTIVITAMIN WITH MINERALS) TABS tablet Take 1 tablet by mouth daily.     . naproxen (NAPROSYN) 375 MG tablet Take 375 mg by mouth 2 (two) times daily as needed (knee pain.).     [name] Polyethyl Glycol-Propyl Glycol (SYSTANE OP) Place 1 drop into both eyes daily as needed (dry eyes).    . potassium chloride SA (KLOR-CON) 20 MEQ tablet Take 1 tablet (20 mEq total) by mouth as directed. 1 tablet daily 20 mEq by mouth as directed per alleviate research protocol. 180 tablet 1  . pravastatin (PRAVACHOL) 40 MG tablet Take 1 tablet (40 mg total) by mouth daily. 90 tablet 1  . tamsulosin (FLOMAX) 0.4 MG CAPS capsule Take 1 capsule by mouth once daily 30 capsule 0  .  temazepam (RESTORIL) 30 MG capsule Take 1 capsule by mouth at bedtime 30 capsule 5  . TRUEplus Lancets 33G MISC Use to check blood sugar 3 times a day.  Dx code: E11.9 300 each 1  . furosemide (LASIX) 80 MG tablet Take 1 tablet (80 mg total) by mouth daily. As needed patient may take an additional 40 MG tablet as direct per Alleviate Research study PRN plan. 30 tablet 6   No facility-administered medications prior to visit.     Allergies:   Sulfonamide derivatives and Zolpidem   Social History   Socioeconomic History  . Marital status: Married    Spouse name: Not on file  . Number of children: 4  . Years of education: 27  . Highest education level: High school graduate  Occupational History  . Occupation: retired    Fish farm manager: [employer]  Tobacco Use  . Smoking status: Former Smoker    Packs/day: 1.00    Years: 10.00    Pack years: 10.00    Quit date: 06/07/1980    Years since quitting: 40.1  . Smokeless tobacco: Never Used  Vaping Use  . Vaping Use: Never used  Substance and Sexual Activity  . Alcohol use: No  . Drug use: No  . Sexual activity: Not Currently  Other Topics Concern  . Not on file  Social History Narrative   Lives with male partner in a one story home.  His son lives there off and on.  Retired from [employer].  Education: high school.  Right handed   Social Determinants of Health   Financial Resource Strain: Not on file  Food Insecurity: Not on file  Transportation Needs: Not on file  Physical Activity: Not on file  Stress: Not on file  Social Connections: Not on file     Family History:  The patient's family history includes Aneurysm in his sister; Cancer in his father; Clotting disorder in his mother; Diabetes in his mother and sister; Heart attack in his mother; Heart disease in his mother; Hyperlipidemia in his mother; Hypertension in his mother and sister; Leukemia in his maternal grandmother; Lung disease in his father; Other in his sister; Seizures  in his sister; Stroke in his mother.   ROS:   Please see the history of present illness.    ROS All other systems are reviewed  and are negative.  PHYSICAL EXAM:   VS:  BP 100/60 (BP Location: Left Arm, Patient Position: Sitting)   Pulse 87   Ht 5\' 11"  (1.803 m)   Wt 198 lb (89.8 kg)   SpO2 98%   BMI 27.62 kg/m      General: Alert, oriented x3, no distress, overweight Head: no evidence of trauma, PERRL, EOMI, no exophtalmos or lid lag, no myxedema, no xanthelasma; normal ears, nose and oropharynx Neck: normal jugular venous pulsations and no hepatojugular reflux; brisk carotid pulses without delay and no carotid bruits Chest: clear to auscultation, no signs of consolidation by percussion or palpation, normal fremitus, symmetrical and full respiratory excursions Cardiovascular: normal position and quality of the apical impulse, regular rhythm, normal first and second heart sounds, 2/6 early peaking aortic ejection murmur, 3/6 decrescendo diastolic murmur on both sides of the sternum, no rubs or gallops Abdomen: no tenderness or distention, no masses by palpation, no abnormal pulsatility or arterial bruits, normal bowel sounds, no hepatosplenomegaly Extremities: no clubbing, cyanosis or edema; 2+ radial, ulnar and brachial pulses bilaterally; 2+ right femoral, posterior tibial and dorsalis pedis pulses; 2+ left femoral, posterior tibial and dorsalis pedis pulses; no subclavian or femoral bruits Neurological: grossly nonfocal Psych: Normal mood and affect   Wt Readings from Last 3 Encounters:  07/09/20 198 lb (89.8 kg)  06/12/20 198 lb 6.4 oz (90 kg)  05/22/20 198 lb 8 oz (90 kg)   Studies/Labs Reviewed:   TEE 03/07/2020: 1. Left ventricular ejection fraction, by estimation, is 35 to 40%. The  left ventricle has moderately decreased function. The left ventricle  demonstrates global hypokinesis.  2. Iatrogenic PFO visualized from prior ablation procedure.  3. Right ventricular  systolic function is mildly reduced. The right  ventricular size is mildly enlarged.  4. Left atrial size was severely dilated. No left atrial/left atrial  appendage thrombus was detected.  5. The mitral valve is normal in structure. Mild mitral valve  regurgitation.  6. The aortic valve is tricuspid. There is mild calcification of the  aortic valve. There is mild thickening of the aortic valve. Aortic valve  regurgitation is mild. Mild aortic valve sclerosis is present, with no  evidence of aortic valve stenosis.  7. Aortic dilatation noted. There is mild dilatation of the ascending  aorta, measuring 37 mm. There is mild dilatation of the aortic root,  measuring 41 mm. There is Moderate (Grade III) plaque.   EKG:  EKG is not ordered today.  Recorder download shows atrial flutter with controlled ventricular rates mostly in the 80s. It shows normal sinus rhythm with first-degree AV block and rightward axis (90 degrees), mildly broadened QRS at 106 ms and a pattern of incomplete left bundle branch block, nonspecific ST-T changes, QTC 490 ms  Lipid Panel    Component Value Date/Time   CHOL 122 05/22/2020 0933   TRIG 99.0 05/22/2020 0933   HDL 37.50 (L) 05/22/2020 0933   CHOLHDL 3 05/22/2020 0933   VLDL 19.8 05/22/2020 0933   LDLCALC 65 05/22/2020 0933   LDLDIRECT 84.0 05/02/2018 1523     ASSESSMENT:    1. Chronic combined systolic and diastolic heart failure (HCC)   2. Atypical atrial flutter (HCC)   3. Nonrheumatic aortic valve insufficiency   4. OSA (obstructive sleep apnea)   5. Overweight   6. Essential hypertension, malignant   7. Long term (current) use of anticoagulants   8. Stage 3b chronic kidney disease (HCC)   9. Controlled type 2  diabetes mellitus with diabetic polyneuropathy, without long-term current use of insulin ([place])   10. Status post placement of implantable loop recorder      PLAN:  In order of problems listed above:  1. CHF (combined systolic and  diastolic): Due to nonischemic cardiomyopathy.  Appears to be clinically euvolemic blood pressure is borderline low, but he has no no symptoms of dizziness and has not experienced syncope.  NYHA functional class II.  On SGLT2 inhibitor, hydralazine/nitrates, beta-blocker, moderate dose of loop diuretic.  Not on RAAS inhibitors due to renal dysfunction 2. Aflutter: Persistent, well rate controlled.  He is reluctant to have any more procedures..  Compliant with apixaban anticoagulation. CHADSVAsc 4 (age, HTN, CHF, history of DM). 3. Moderate AI: Was actually described as mild on his transesophageal echo.  Has never appeared to be severe enough to be a cause of his cardiomyopathy. 4. OSA: Faithfully uses CPAP and has no daytime hypersomnolence 5.  Overweight: He has been doing a good job keeping the weight off.   6. HTN: Excellent control. 7.  Anticoagulation: No bleeding complications. 9. CKD 3b: Baseline 1.8-1.9.  GFR is around 35-40. Avoid NSAIDs 10. DM: Most recent hemoglobin A1c was a little out of range at 8.1%.  On SGLT2 inhibitor. Start gabapentin for neuropathy.  Lipid profile is generally favorable with exception of the persistently low HDL. 11. ILR: Implanted as part of a clinical heart failure trial, but also be very useful for monitoring of arrhythmia rate control.  Medication Adjustments/Labs and Tests Ordered: Current medicines are reviewed at length with the patient today.  Concerns regarding medicines are outlined above.  Medication changes, Labs and Tests ordered today are listed in the Patient Instructions below. Patient Instructions  Medication Instructions:  START Gabapentin 100 mg once daily at bedtime  *If you need a refill on your cardiac medications before your next appointment, please call your pharmacy*   Lab Work: None ordered If you have labs (blood work) drawn today and your tests are completely normal, you will receive your results only by: [name] MyChart Message (if you  have MyChart) OR . A paper copy in the mail If you have any lab test that is abnormal or we need to change your treatment, we will call you to review the results.   Testing/Procedures: None ordered   Follow-Up: At [facility], you and your health needs are our priority.  As part of our continuing mission to provide you with exceptional heart care, we have created designated Provider Care Teams.  These Care Teams include your primary Cardiologist (physician) and Advanced Practice Providers (APPs -  Physician Assistants and Nurse Practitioners) who all work together to provide you with the care you need, when you need it.  We recommend signing up for the patient portal called "MyChart".  Sign up information is provided on this After Visit Summary.  MyChart is used to connect with patients for Virtual Visits (Telemedicine).  Patients are able to view lab/test results, encounter notes, upcoming appointments, etc.  Non-urgent messages can be sent to your provider as well.   To learn more about what you can do with MyChart, go to [email or web address].    Your next appointment:   6 month(s)  The format for your next appointment:   In Person  Provider:   Sanda Klein, MD      Signed, Sanda Klein, MD  07/13/2020 10:39 AM    [address], [place], [place]  [address]  Phone: [phone]; Fax: [phone]

## 2020-07-15 DIAGNOSIS — G4733 Obstructive sleep apnea (adult) (pediatric): Secondary | ICD-10-CM | POA: Diagnosis not present

## 2020-07-20 ENCOUNTER — Other Ambulatory Visit: Payer: Self-pay | Admitting: Family Medicine

## 2020-07-20 DIAGNOSIS — G4700 Insomnia, unspecified: Secondary | ICD-10-CM

## 2020-07-20 DIAGNOSIS — E669 Obesity, unspecified: Secondary | ICD-10-CM

## 2020-07-20 DIAGNOSIS — D649 Anemia, unspecified: Secondary | ICD-10-CM

## 2020-07-20 DIAGNOSIS — H409 Unspecified glaucoma: Secondary | ICD-10-CM

## 2020-07-20 DIAGNOSIS — M25562 Pain in left knee: Secondary | ICD-10-CM

## 2020-07-20 DIAGNOSIS — E1169 Type 2 diabetes mellitus with other specified complication: Secondary | ICD-10-CM

## 2020-07-20 DIAGNOSIS — M25561 Pain in right knee: Secondary | ICD-10-CM

## 2020-07-20 DIAGNOSIS — F411 Generalized anxiety disorder: Secondary | ICD-10-CM

## 2020-07-20 DIAGNOSIS — E785 Hyperlipidemia, unspecified: Secondary | ICD-10-CM

## 2020-07-20 DIAGNOSIS — Z7901 Long term (current) use of anticoagulants: Secondary | ICD-10-CM

## 2020-07-20 DIAGNOSIS — I10 Essential (primary) hypertension: Secondary | ICD-10-CM

## 2020-07-20 DIAGNOSIS — E875 Hyperkalemia: Secondary | ICD-10-CM

## 2020-07-22 ENCOUNTER — Other Ambulatory Visit: Payer: Self-pay

## 2020-07-22 ENCOUNTER — Ambulatory Visit: Payer: Medicare HMO | Admitting: Internal Medicine

## 2020-07-22 ENCOUNTER — Encounter: Payer: Self-pay | Admitting: Internal Medicine

## 2020-07-22 VITALS — BP 124/70 | HR 72 | Ht 71.0 in | Wt 196.6 lb

## 2020-07-22 DIAGNOSIS — E782 Mixed hyperlipidemia: Secondary | ICD-10-CM

## 2020-07-22 DIAGNOSIS — E1169 Type 2 diabetes mellitus with other specified complication: Secondary | ICD-10-CM | POA: Diagnosis not present

## 2020-07-22 DIAGNOSIS — E663 Overweight: Secondary | ICD-10-CM

## 2020-07-22 DIAGNOSIS — G63 Polyneuropathy in diseases classified elsewhere: Secondary | ICD-10-CM | POA: Diagnosis not present

## 2020-07-22 DIAGNOSIS — E669 Obesity, unspecified: Secondary | ICD-10-CM

## 2020-07-22 LAB — POCT GLYCOSYLATED HEMOGLOBIN (HGB A1C): Hemoglobin A1C: 6.3 % — AB (ref 4.0–5.6)

## 2020-07-22 MED ORDER — GABAPENTIN 100 MG PO CAPS
200.0000 mg | ORAL_CAPSULE | Freq: Every day | ORAL | 3 refills | Status: AC
Start: 2020-07-22 — End: ?

## 2020-07-22 NOTE — Progress Notes (Signed)
Patient ID: [name], male   DOB: Oct 10, 1950, 70 y.o.   MRN: [id number]  This visit occurred during the SARS-CoV-2 public health emergency.  Safety protocols were in place, including screening questions prior to the visit, additional usage of staff PPE, and extensive cleaning of exam room while observing appropriate contact time as indicated for disinfecting solutions.   HPI: [name] is a 70 y.o.-year-old male, initially referred by his cardiologist, Dr. Sallyanne Kuster, returning for follow-up DM2, dx in 2011, non-insulin-dependent, uncontrolled, with complications (CHF, CKD, ED).  Last visit 3 months ago.  Since last OV, he decreased baked foods, started meditation.  His sugars improved significantly.  Reviewed HbA1c levels Lab Results  Component Value Date   HGBA1C 8.1 (A) 04/18/2020   HGBA1C 6.6 (A) 12/14/2019   HGBA1C 5.3 09/14/2019   HGBA1C 6.4 06/11/2019   HGBA1C 5.8 02/06/2019   HGBA1C 5.7 08/03/2018   HGBA1C 7.0 (H) 05/02/2018   HGBA1C 6.7 (H) 01/24/2018   HGBA1C 6.2 10/21/2017   HGBA1C 7.2 (H) 03/18/2017   HGBA1C 7.0 (H) 11/15/2016   HGBA1C 7.5 (H) 08/20/2016   HGBA1C 6.8 (H) 05/21/2016   HGBA1C 6.7 (H) 02/18/2016   HGBA1C 6.4 11/17/2015   HGBA1C 6.4 07/15/2015   HGBA1C 6.6 (H) 04/14/2015   HGBA1C 6.1 01/07/2015   HGBA1C 6.8 (H) 09/13/2014   HGBA1C 6.8 (H) 06/13/2014   HGBA1C 6.6 (H) 01/09/2014   HGBA1C 6.4 (H) 10/08/2013   HGBA1C 7.0 (H) 07/11/2013   HGBA1C 6.6 (H) 01/31/2013   HGBA1C 5.9 (H) 10/31/2012   HGBA1C 6.8 (H) 06/22/2012   HGBA1C 6.7 (H) 03/08/2012   HGBA1C 6.6 (H) 11/04/2011   HGBA1C 7.3 (H) 07/22/2011   HGBA1C 8.3 (H) 03/24/2011   Pt is on a regimen of: - Farxiga 10 mg daily-started by Dr. Haroldine Laws - Glyburide 5 >> 2.5 mg after b'fast and 0-2.5 mg 2h after dinner >> 0.5 tab as needed before a large meal - took it 2x since last visit He was not able to start Ozempic 0.5 mg- 04/2020 - due to price. He was on Metformin in the past.  Pt  checks his sugars once a day: - am: 60-94 >> 130-140 >> 130, 142-199 >> 95-142, 154, 170 - 2h after b'fast: n/c - before lunch: n/c - 2h after lunch: n/c >> 165 >> n/c  - before dinner: n/c >> 170 >> 109, 118 - 2h after dinner: n/c >> 142-175 - bedtime: 68-155, 176, 376  >> 154, 157, 200 >> n/c - nighttime: n/c Lowest sugar was 60 >> 100 >> 130; he has hypoglycemia awareness in the 50s.  Highest sugar was 376 (cheese cake) >> 200 (pasta) >> 199 >> 175.  Glucometer: One Touch verio IQ  Pt's meals are: - Breakfast: oatmeal + berries/apples + cinnamon, cereal, fruit - Lunch: light meal: ensure, fruit - Dinner: chicken or fish, ground [place] + veggies, occasionally fried foods (1x a mo) - Snacks: cranberry juice, grape juice, aple juice >> diluted by at least 50%.  -+ CKD-sees Dr. Juleen China, last BUN/creatinine:  Lab Results  Component Value Date   BUN 33 (H) 05/20/2020   BUN 29 (H) 02/25/2020   CREATININE 1.87 (H) 05/20/2020   CREATININE 2.04 (H) 02/25/2020   Lab Results  Component Value Date   GFRAA 42 (L) 05/20/2020   GFRAA 38 (L) 02/25/2020   GFRAA 36 (L) 11/26/2019   GFRAA 39 (L) 11/12/2019   GFRAA 36 (L) 09/18/2019   GFRAA 37 (L) 08/30/2019  GFRAA 39 (L) 08/14/2019   GFRAA 45 (L) 07/11/2019   GFRAA 51 (L) 05/29/2019   GFRAA 58 (L) 05/28/2019  He is not on ACE inhibitor/ARB.  -+ HL; last set of lipids: Lab Results  Component Value Date   CHOL 122 05/22/2020   HDL 37.50 (L) 05/22/2020   LDLCALC 65 05/22/2020   LDLDIRECT 84.0 05/02/2018   TRIG 99.0 05/22/2020   CHOLHDL 3 05/22/2020  On pravastatin 40, omega-3 fatty acids.  - last eye exam was in spring 2020: Reportedly no DR, + glaucoma.  -+ numbness and tingling in his feet.Dr. Sallyanne Kuster started him on Gabapentin 100 mg daily.  Pt has FH of DM in mother, sister.  He is on amiodarone.  No FH of MTC. No personal hx of pancreatitis.  ROS: Constitutional: no weight gain/no weight loss, no fatigue, no  subjective hyperthermia, no subjective hypothermia Eyes: no blurry vision, no xerophthalmia ENT: no sore throat, no nodules palpated in neck, no dysphagia, no odynophagia, no hoarseness Cardiovascular: no CP/no SOB/no palpitations/no leg swelling Respiratory: no cough/no SOB/no wheezing Gastrointestinal: no N/no V/no D/no C/no acid reflux Musculoskeletal: no muscle aches/no joint aches Skin: no rashes, no hair loss Neurological: + tremors/no numbness/no tingling/no dizziness  I reviewed pt's medications, allergies, PMH, social hx, family hx, and changes were documented in the history of present illness. Otherwise, unchanged from my initial visit note.  Past Medical History:  Diagnosis Date  . AORTIC STENOSIS, MODERATE 12/05/2009  . Atrial fibrillation ([place])   . Atrial fibrillation with RVR ([place]) 07/05/2017  . Atrial flutter ([place]) 12/05/2009  . Atypical chest pain 11/08/2011  . BENIGN PROSTATIC HYPERTROPHY, HX OF 12/05/2009  . CHEST PAIN-UNSPECIFIED 11/11/2009  . CHF 12/05/2009  . Chronic renal insufficiency 12/16/2016  . Debility 04/18/2012  . Diarrhea 12/16/2016  . DYSPNEA 12/19/2009  . Edema 04/18/2012  . ERECTILE DYSFUNCTION 01/25/2007  . FATIGUE, CHRONIC 11/11/2009  . Gout 07/10/2012  . Heart murmur   . History of kidney stones    "they passed" (07/05/2017)  . Hyperkalemia 04/18/2012  . Hyperlipidemia   . Hypertension   . HYPERTENSION 01/25/2007  . Hypoxia 05/05/2011  . Insomnia 05/13/2016  . INSOMNIA, HX OF 01/25/2007  . Knee pain, bilateral 12/28/2010  . Knee pain, right 12/28/2010  . NEUROMA 01/05/2010  . OBESITY NOS 01/25/2007  . OSA on CPAP 04/06/2010  . PERIPHERAL NEUROPATHY 01/05/2010  . PULMONARY FUNCTION TESTS, ABNORMAL 02/02/2010  . Superficial thrombophlebitis of left leg 09/07/2011  . TESTICULAR HYPOFUNCTION 01/05/2010  . 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 Toe pain, right 07/10/2012  . Type II diabetes mellitus ([place]) 12/05/2009  . UNSPECIFIED ANEMIA 01/05/2010   Past Surgical History:   Procedure Laterality Date  . A-FLUTTER ABLATION N/A 05/16/2019   Procedure: A-FLUTTER ABLATION;  Surgeon: Constance Haw, MD;  Location: [place] [facility];  Service: Cardiovascular;  Laterality: N/A;  . ATRIAL FIBRILLATION ABLATION N/A 05/16/2019   Procedure: ATRIAL FIBRILLATION ABLATION;  Surgeon: Constance Haw, MD;  Location: [place] [facility];  Service: Cardiovascular;  Laterality: N/A;  . ATRIAL FIBRILLATION ABLATION N/A 03/10/2020   Procedure: ATRIAL FIBRILLATION ABLATION;  Surgeon: Constance Haw, MD;  Location: [place] [facility];  Service: Cardiovascular;  Laterality: N/A;  . CARDIAC CATHETERIZATION  10/16/2009   nonischemic cardiomyopathy  . CARDIOVERSION N/A 03/25/2015   Procedure: CARDIOVERSION;  Surgeon: Pixie Casino, MD;  Location: [facility] ENDOSCOPY;  Service: Cardiovascular;  Laterality: N/A;  . CARDIOVERSION N/A 06/17/2017   Procedure: CARDIOVERSION;  Surgeon:  Croitoru, Dani Gobble, MD;  Location: [place];  Service: Cardiovascular;  Laterality: N/A;  . CARDIOVERSION N/A 07/08/2017   Procedure: CARDIOVERSION;  Surgeon: Fay Records, MD;  Location[facility];  Service: Cardiovascular;  Laterality: N/A;  . CARDIOVERSION N/A 02/27/2019   Procedure: CARDIOVERSION;  Surgeon: Sanda Klein, MD;  Location: [place] [facility];  Service: Cardiovascular;  Laterality: N/A;  . CARDIOVERSION N/A 08/16/2019   Procedure: CARDIOVERSION;  Surgeon: Sanda Klein, MD;  Location: [facility] ENDOSCOPY;  Service: Cardiovascular;  Laterality: N/A;  . METATARSAL OSTEOTOMY Bilateral ~ 1980   removed part of 5th metatarsal to corect curvature of toes   . RIGHT HEART CATH N/A 05/16/2019   Procedure: RIGHT HEART CATH;  Surgeon: Jolaine Artist, MD;  Location: [place] [facility];  Service: Cardiovascular;  Laterality: N/A;  . TEE WITHOUT CARDIOVERSION  05/16/2019   Procedure: Transesophageal Echocardiogram (Tee);  Surgeon: Constance Haw, MD;  Location: [place] [facility];  Service:  Cardiovascular;;  . TEE WITHOUT CARDIOVERSION N/A 03/07/2020   Procedure: TRANSESOPHAGEAL ECHOCARDIOGRAM (TEE);  Surgeon: Freada Bergeron, MD;  Location: [facility] ENDOSCOPY;  Service: Cardiovascular;  Laterality: N/A;   Social History   Socioeconomic History  . Marital status: Married    Spouse name: Not on file  . Number of children: 4  . Years of education: 79  . Highest education level: High school graduate  Occupational History  . Occupation: retired    Fish farm manager: [employer]  Tobacco Use  . Smoking status: Former Smoker    Packs/day: 1.00    Years: 10.00    Pack years: 10.00    Quit date: 06/07/1980    Years since quitting: 40.1  . Smokeless tobacco: Never Used  Vaping Use  . Vaping Use: Never used  Substance and Sexual Activity  . Alcohol use: No  . Drug use: No  . Sexual activity: Not Currently  Other Topics Concern  . Not on file  Social History Narrative   Lives with male partner in a one story home.  His son lives there off and on.  Retired from [employer].  Education: high school.  Right handed   Social Determinants of Health   Financial Resource Strain: Not on file  Food Insecurity: Not on file  Transportation Needs: Not on file  Physical Activity: Not on file  Stress: Not on file  Social Connections: Not on file  Intimate Partner Violence: Not on file   Current Outpatient Medications on File Prior to Visit  Medication Sig Dispense Refill  . albuterol (VENTOLIN HFA) 108 (90 Base) MCG/ACT inhaler Inhale 2 puffs into the lungs every 6 (six) hours as needed for wheezing or shortness of breath. 1 Inhaler 6  . Alcohol Swabs 70 % PADS Use as directed 200 each 1  . allopurinol (ZYLOPRIM) 300 MG tablet Take 1 tablet by mouth once daily 90 tablet 1  . Blood Glucose Monitoring Suppl (TRUE METRIX AIR GLUCOSE METER) w/Device KIT Use to check blood sugar 3 times a day.  Dx code: E11.9 1 kit 0  . calcium carbonate (TUMS - DOSED IN MG ELEMENTAL CALCIUM) 500 MG chewable tablet  Chew 500 mg by mouth daily as needed for indigestion or heartburn.    . cetirizine (ZYRTEC) 10 MG tablet Take 1 tablet (10 mg total) by mouth daily. 30 tablet 11  . Clobetasol Prop Emollient Base (CLOBETASOL PROPIONATE E) 0.05 % emollient cream Apply 1 application topically 2 (two) times daily as needed (insect bite). 60 g 1  .  colchicine (COLCRYS) 0.6 MG tablet Take one tablet for 4-6 days as needed for gout flare up. 15 tablet 3  . dapagliflozin propanediol (FARXIGA) 10 MG TABS tablet Take 1 tablet (10 mg total) by mouth daily. Needs appointment for further refill. 30 tablet 0  . ELIQUIS 5 MG TABS tablet Take 1 tablet by mouth twice daily 180 tablet 1  . fish oil-omega-3 fatty acids 1000 MG capsule Take 1 g by mouth daily.    . furosemide (LASIX) 80 MG tablet Take 1 tablet (80 mg total) by mouth daily. As needed patient may take an additional 40 MG tablet as direct per [employer] study PRN plan. 95 tablet 3  . gabapentin (NEURONTIN) 100 MG capsule Take 1 capsule (100 mg total) by mouth at bedtime. 90 capsule 3  . glucose blood (TRUE METRIX BLOOD GLUCOSE TEST) test strip Use to check blood sugar 3 times a day.  Dx code: E11.9 300 each 1  . glyBURIDE (DIABETA) 5 MG tablet Take 1 tablet by mouth daily in the morning and 1/2 tablet by mouth every evening 135 tablet 1  . hydrALAZINE (APRESOLINE) 25 MG tablet Take 1 tablet (25 mg total) by mouth 3 (three) times daily. 270 tablet 2  . HYDROcodone-acetaminophen (NORCO) 10-325 MG tablet Take 1 tablet by mouth every 8 (eight) hours as needed for moderate pain or severe pain. 90 tablet 0  . hydrocortisone 2.5 % ointment Apply topically 2 (two) times daily as needed. 30 g 1  . isosorbide mononitrate (IMDUR) 30 MG 24 hr tablet Take 1 tablet by mouth once daily 90 tablet 0  . LORazepam (ATIVAN) 1 MG tablet TAKE 1 TABLET BY MOUTH EVERY 8 HOURS AS NEEDED FOR ANXIETY 30 tablet 0  . magnesium oxide (MAG-OX) 400 MG tablet Take 1 tablet (400 mg total) by mouth  2 (two) times daily. 30 tablet 11  . metoprolol succinate (TOPROL-XL) 50 MG 24 hr tablet Take 1 tablet (50 mg total) by mouth 2 (two) times daily. Take with or immediately following a meal. 180 tablet 3  . Multiple Vitamin (MULTIVITAMIN WITH MINERALS) TABS tablet Take 1 tablet by mouth daily.     . naproxen (NAPROSYN) 375 MG tablet Take 375 mg by mouth 2 (two) times daily as needed (knee pain.).     [name] Polyethyl Glycol-Propyl Glycol (SYSTANE OP) Place 1 drop into both eyes daily as needed (dry eyes).    . potassium chloride SA (KLOR-CON) 20 MEQ tablet Take 1 tablet (20 mEq total) by mouth as directed. 1 tablet daily 20 mEq by mouth as directed per alleviate research protocol. 180 tablet 1  . pravastatin (PRAVACHOL) 40 MG tablet Take 1 tablet (40 mg total) by mouth daily. 90 tablet 1  . tamsulosin (FLOMAX) 0.4 MG CAPS capsule Take 1 capsule by mouth once daily 30 capsule 0  . temazepam (RESTORIL) 30 MG capsule Take 1 capsule by mouth at bedtime 30 capsule 5  . TRUEplus Lancets 33G MISC Use to check blood sugar 3 times a day.  Dx code: E11.9 300 each 1   No current facility-administered medications on file prior to visit.   Allergies  Allergen Reactions  . Sulfonamide Derivatives Other (See Comments)    Flu like symptoms   . Zolpidem Other (See Comments)    Excessive, prolonged sedation   Family History  Problem Relation Age of Onset  . Clotting disorder Mother   . Heart disease Mother        s/p MI  .  Heart attack Mother   . Hypertension Mother   . Diabetes Mother   . Hyperlipidemia Mother   . Stroke Mother   . Cancer Father        ? lung  . Lung disease Father        smoker  . Diabetes Sister   . Hypertension Sister        smoker  . Leukemia Maternal Grandmother        ?  [name] Aneurysm Sister        brain  . Other Sister        clipped  . Seizures Sister        d/o w/aneurysm/ smoker   PE: BP 124/70 (BP Location: Right Arm, Patient Position: Sitting, Cuff Size: Normal)    Pulse 72   Ht $R'5\' 11"'ak$  (1.803 m)   Wt 196 lb 9.6 oz (89.2 kg)   SpO2 99%   BMI 27.42 kg/m  Wt Readings from Last 3 Encounters:  07/22/20 196 lb 9.6 oz (89.2 kg)  07/09/20 198 lb (89.8 kg)  06/12/20 198 lb 6.4 oz (90 kg)   Constitutional: overweight, in NAD Eyes: PERRLA, EOMI, no exophthalmos ENT: moist mucous membranes, no thyromegaly, no cervical lymphadenopathy Cardiovascular: RRR, No MRG Respiratory: CTA B Gastrointestinal: abdomen soft, NT, ND, BS+ Musculoskeletal: no deformities, strength intact in all 4 Skin: moist, warm, no rashes Neurological: no tremor with outstretched hands, DTR normal in all 4  ASSESSMENT: 1. DM2, non-insulin-dependent, uncontrolled, with long-term complications - s and d CHF - A fib - s/p cardioversion - 05/2019 >> went into cardiac shock - CKD - sees nephrology  2.  Overweight  3. HL  4.  Peripheral neuropathy  PLAN:  1. Patient with longstanding, previously uncontrolled type 2 diabetes, with increased stopping glyburide.  At last visit, she was on an SGLT2 inhibitor started by Dr. Haroldine Laws blood sugars increased significantly, for an unknown reason.  At that time, I suggested to add a GLP-1 receptor I demonstrated Ozempic pen needles and advised him how to adjust doses.  We continued [place].  HbA1c was higher, at 8.1%. -At today's visit, sugars have improved but upon questioning, he was not able to start Ozempic due to price.  He started to improve his diet (decreased intake of baked goods - he likes to play), also started to meditate and tries to stay active. -Reviewing his meter downloads, sugars are usually in target range or slightly above, while later in the day they are all at goal. -For now, we can continue current regimen with Farxiga and only PRN glyburide.  He rarely takes this -took it maybe twice since last visit - I suggested to:  Patient Instructions  Please continue: - Farxiga 10 mg before b'fast  You may take glyburide half a  tablet before a meal before a meal with fried food or dessert.  Check sugars once a day, rotating check times.  You can increase gabapentin to 200 mg at bedtime.  Please return in 4 months with your sugar log.   - we checked his HbA1c: 6.3% (much lower) - advised to check sugars at different times of the day - 1x a day, rotating check times - advised for yearly eye exams >> he is not UTD -advised to schedule - return to clinic in 4 months  2.  Overweight -continue SGLT 2 inhibitor and GLP-1 receptor agonist which should also help with weight loss -He gained 10 pounds before last visit, but lost 14  previously -Since last visit, he gained 4 pounds  3. HL -Reviewed latest lipid panel from 05/2020: Fractions at goal with the exception of a slightly low HDL: Lab Results  Component Value Date   CHOL 122 05/22/2020   HDL 37.50 (L) 05/22/2020   LDLCALC 65 05/22/2020   LDLDIRECT 84.0 05/02/2018   TRIG 99.0 05/22/2020   CHOLHDL 3 05/22/2020  -He continues pravastatin 40 and omega-3 fatty acids without side effects.  4.  Peripheral neuropathy -Most likely related to diabetes -Numbness and tingling increased recently -Dr. Sallyanne Kuster added gabapentin 100 mg at bedtime.  This helps, but he feels is not enough. -At this visit, we will increase the dose to 200 mg daily.  Discussed about possible side effects including dizziness and weight gain  Philemon Kingdom, MD PhD [facility] Endocrinology

## 2020-07-22 NOTE — Patient Instructions (Addendum)
Please continue: - Farxiga 10 mg before b'fast  You may take glyburide half a tablet before a meal before a meal with fried food or dessert.  Check sugars once a day, rotating check times.  You can increase gabapentin to 200 mg at bedtime.  Please return in 4 months with your sugar log.

## 2020-07-30 DIAGNOSIS — Z006 Encounter for examination for normal comparison and control in clinical research program: Secondary | ICD-10-CM

## 2020-07-30 NOTE — Research (Signed)
Alleviate Research Study  Carelink Transmission       

## 2020-08-05 DIAGNOSIS — R972 Elevated prostate specific antigen [PSA]: Secondary | ICD-10-CM | POA: Diagnosis not present

## 2020-08-05 DIAGNOSIS — E291 Testicular hypofunction: Secondary | ICD-10-CM | POA: Diagnosis not present

## 2020-08-12 ENCOUNTER — Other Ambulatory Visit (HOSPITAL_COMMUNITY): Payer: Self-pay | Admitting: Internal Medicine

## 2020-08-12 DIAGNOSIS — G4733 Obstructive sleep apnea (adult) (pediatric): Secondary | ICD-10-CM | POA: Diagnosis not present

## 2020-08-13 DIAGNOSIS — R350 Frequency of micturition: Secondary | ICD-10-CM | POA: Diagnosis not present

## 2020-08-13 DIAGNOSIS — R972 Elevated prostate specific antigen [PSA]: Secondary | ICD-10-CM | POA: Diagnosis not present

## 2020-08-13 DIAGNOSIS — N401 Enlarged prostate with lower urinary tract symptoms: Secondary | ICD-10-CM | POA: Diagnosis not present

## 2020-08-13 DIAGNOSIS — E291 Testicular hypofunction: Secondary | ICD-10-CM | POA: Diagnosis not present

## 2020-08-17 ENCOUNTER — Other Ambulatory Visit: Payer: Self-pay | Admitting: Cardiovascular Disease

## 2020-08-24 ENCOUNTER — Other Ambulatory Visit (HOSPITAL_COMMUNITY): Payer: Self-pay | Admitting: Adult Health

## 2020-08-31 ENCOUNTER — Other Ambulatory Visit: Payer: Self-pay | Admitting: Cardiology

## 2020-09-03 DIAGNOSIS — H40013 Open angle with borderline findings, low risk, bilateral: Secondary | ICD-10-CM | POA: Diagnosis not present

## 2020-09-07 ENCOUNTER — Other Ambulatory Visit: Payer: Self-pay | Admitting: Family Medicine

## 2020-09-08 NOTE — Telephone Encounter (Signed)
Requesting: lorazepam 1mg  Contract: 10/21/2017 UDS: 08/03/2018 Last Visit: 05/22/2020 Next Visit: 09/09/2020 Last Refill: 06/30/2020 #30 and 0RF  Please Advise

## 2020-09-09 ENCOUNTER — Ambulatory Visit: Payer: Medicare HMO | Admitting: Family Medicine

## 2020-09-09 DIAGNOSIS — Z0289 Encounter for other administrative examinations: Secondary | ICD-10-CM

## 2020-09-11 ENCOUNTER — Telehealth (INDEPENDENT_AMBULATORY_CARE_PROVIDER_SITE_OTHER): Payer: Medicare HMO | Admitting: Family Medicine

## 2020-09-11 ENCOUNTER — Encounter: Payer: Self-pay | Admitting: Family Medicine

## 2020-09-11 ENCOUNTER — Other Ambulatory Visit: Payer: Self-pay

## 2020-09-11 VITALS — BP 104/63 | HR 59 | Temp 96.8°F | Wt 185.0 lb

## 2020-09-11 DIAGNOSIS — I10 Essential (primary) hypertension: Secondary | ICD-10-CM | POA: Diagnosis not present

## 2020-09-11 DIAGNOSIS — G8929 Other chronic pain: Secondary | ICD-10-CM

## 2020-09-11 DIAGNOSIS — E782 Mixed hyperlipidemia: Secondary | ICD-10-CM

## 2020-09-11 DIAGNOSIS — N189 Chronic kidney disease, unspecified: Secondary | ICD-10-CM

## 2020-09-11 DIAGNOSIS — D649 Anemia, unspecified: Secondary | ICD-10-CM | POA: Diagnosis not present

## 2020-09-11 DIAGNOSIS — M79645 Pain in left finger(s): Secondary | ICD-10-CM | POA: Diagnosis not present

## 2020-09-11 DIAGNOSIS — M1A9XX0 Chronic gout, unspecified, without tophus (tophi): Secondary | ICD-10-CM

## 2020-09-11 DIAGNOSIS — I4891 Unspecified atrial fibrillation: Secondary | ICD-10-CM | POA: Diagnosis not present

## 2020-09-11 DIAGNOSIS — R079 Chest pain, unspecified: Secondary | ICD-10-CM | POA: Diagnosis not present

## 2020-09-11 DIAGNOSIS — E669 Obesity, unspecified: Secondary | ICD-10-CM

## 2020-09-11 DIAGNOSIS — E1169 Type 2 diabetes mellitus with other specified complication: Secondary | ICD-10-CM | POA: Diagnosis not present

## 2020-09-11 NOTE — Assessment & Plan Note (Addendum)
hgba1c acceptable, minimize simple carbs. Increase exercise as tolerated. Continue current meds. Follows with Dr [name]. Referred to podiatry

## 2020-09-11 NOTE — Assessment & Plan Note (Signed)
Doing great and off of Tikosyn. He is seeing cardiology q 6 month.

## 2020-09-11 NOTE — Assessment & Plan Note (Signed)
Hydrate and monitor 

## 2020-09-11 NOTE — Assessment & Plan Note (Signed)
Well controlled, no changes to meds. Encouraged heart healthy diet such as the DASH diet and exercise as tolerated.  °

## 2020-09-12 ENCOUNTER — Telehealth: Payer: Self-pay | Admitting: *Deleted

## 2020-09-12 DIAGNOSIS — G4733 Obstructive sleep apnea (adult) (pediatric): Secondary | ICD-10-CM | POA: Diagnosis not present

## 2020-09-12 NOTE — Telephone Encounter (Signed)
Please let him know given his risk factors he should take the 4th shot, would do it at 6 + months

## 2020-09-12 NOTE — Telephone Encounter (Signed)
Mychart message sent to patient.

## 2020-09-12 NOTE — Telephone Encounter (Signed)
Pt is wanting to know if he is eligible for a 4th COVID vaccine?  He uses mychart and states we can send him a response via mychart.  Also, notified pt of [facility] estimate for blood typing (89.99 and 12.00 venipuncture fee).  Pt acknowledges cost and is willing to proceed with testing. Advised pt he will get a bill from [facility] if insurance does not pay for testing. [facility] will not come from [place] and he does not need to pay cost up front. Pt voices understanding.

## 2020-09-14 DIAGNOSIS — M79645 Pain in left finger(s): Secondary | ICD-10-CM | POA: Insufficient documentation

## 2020-09-14 DIAGNOSIS — G8929 Other chronic pain: Secondary | ICD-10-CM | POA: Insufficient documentation

## 2020-09-14 NOTE — Assessment & Plan Note (Signed)
Encouraged moist heat and gentle stretching as tolerated. May try Tylenol and prescription meds as directed and report if symptoms worsen or seek immediate care. Check an xray

## 2020-09-14 NOTE — Progress Notes (Signed)
Virtual telephone visit    Virtual Visit via Telephone Note   This visit type was conducted due to national recommendations for restrictions regarding the COVID-19 Pandemic (e.g. social distancing) in an effort to limit this patient's exposure and mitigate transmission in our community. Due to his co-morbid illnesses, this patient is at least at moderate risk for complications without adequate follow up. This format is felt to be most appropriate for this patient at this time. The patient did not have access to video technology or had technical difficulties with video requiring transitioning to audio format only (telephone). Physical exam was limited to content and character of the telephone converstion. Nena Alexander, CMA was able to get the patient set up on a telephone visit.   Patient location: home Patient and provider in visit Provider location: Office  I discussed the limitations of evaluation and management by telemedicine and the availability of in person appointments. The patient expressed understanding and agreed to proceed.   Visit Date: 09/11/2020  Today's healthcare provider: Penni Homans, MD     Subjective:    Patient ID: [name], male    DOB: [birth year], 70 y.o.   MRN: [id number]  Chief Complaint  Patient presents with  . Follow-up  . Diabetes  . Hyperlipidemia    HPI Patient is in today for follow up on chronic medical concerns. No recent febrile illness or hospitalizations. No acute concerns. His afib is well controlled and is no longer needing the Tikosyn. He is eating well and staying active. Denies CP/palp/SOB/HA/congestion/fevers/GI or GU c/o. Taking meds as prescribed  Past Medical History:  Diagnosis Date  . AORTIC STENOSIS, MODERATE 12/05/2009  . Atrial fibrillation ([place])   . Atrial fibrillation with RVR ([place]) 07/05/2017  . Atrial flutter ([place]) 12/05/2009  . Atypical chest pain 11/08/2011  . BENIGN PROSTATIC HYPERTROPHY, HX OF 12/05/2009  . CHEST  PAIN-UNSPECIFIED 11/11/2009  . CHF 12/05/2009  . Chronic renal insufficiency 12/16/2016  . Debility 04/18/2012  . Diarrhea 12/16/2016  . DYSPNEA 12/19/2009  . Edema 04/18/2012  . ERECTILE DYSFUNCTION 01/25/2007  . FATIGUE, CHRONIC 11/11/2009  . Gout 07/10/2012  . Heart murmur   . History of kidney stones    "they passed" (07/05/2017)  . Hyperkalemia 04/18/2012  . Hyperlipidemia   . Hypertension   . HYPERTENSION 01/25/2007  . Hypoxia 05/05/2011  . Insomnia 05/13/2016  . INSOMNIA, HX OF 01/25/2007  . Knee pain, bilateral 12/28/2010  . Knee pain, right 12/28/2010  . NEUROMA 01/05/2010  . OBESITY NOS 01/25/2007  . OSA on CPAP 04/06/2010  . PERIPHERAL NEUROPATHY 01/05/2010  . PULMONARY FUNCTION TESTS, ABNORMAL 02/02/2010  . Superficial thrombophlebitis of left leg 09/07/2011  . TESTICULAR HYPOFUNCTION 01/05/2010  . 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 Toe pain, right 07/10/2012  . Type II diabetes mellitus ([place]) 12/05/2009  . UNSPECIFIED ANEMIA 01/05/2010    Past Surgical History:  Procedure Laterality Date  . A-FLUTTER ABLATION N/A 05/16/2019   Procedure: A-FLUTTER ABLATION;  Surgeon: Constance Haw, MD;  Location: [place] [facility];  Service: Cardiovascular;  Laterality: N/A;  . ATRIAL FIBRILLATION ABLATION N/A 05/16/2019   Procedure: ATRIAL FIBRILLATION ABLATION;  Surgeon: Constance Haw, MD;  Location: [place] [facility];  Service: Cardiovascular;  Laterality: N/A;  . ATRIAL FIBRILLATION ABLATION N/A 03/10/2020   Procedure: ATRIAL FIBRILLATION ABLATION;  Surgeon: Constance Haw, MD;  Location: [place] [facility];  Service: Cardiovascular;  Laterality: N/A;  . CARDIAC CATHETERIZATION  10/16/2009   nonischemic cardiomyopathy  .  CARDIOVERSION N/A 03/25/2015   Procedure: CARDIOVERSION;  Surgeon: Pixie Casino, MD;  Location: [place];  Service: Cardiovascular;  Laterality: N/A;  . CARDIOVERSION N/A 06/17/2017   Procedure: CARDIOVERSION;  Surgeon: Sanda Klein, MD;  Location: [place] [facility];   Service: Cardiovascular;  Laterality: N/A;  . CARDIOVERSION N/A 07/08/2017   Procedure: CARDIOVERSION;  Surgeon: Fay Records, MD;  Location: [facility];  Service: Cardiovascular;  Laterality: N/A;  . CARDIOVERSION N/A 02/27/2019   Procedure: CARDIOVERSION;  Surgeon: Sanda Klein, MD;  Location: [facility] ENDOSCOPY;  Service: Cardiovascular;  Laterality: N/A;  . CARDIOVERSION N/A 08/16/2019   Procedure: CARDIOVERSION;  Surgeon: Sanda Klein, MD;  Location: [facility] ENDOSCOPY;  Service: Cardiovascular;  Laterality: N/A;  . METATARSAL OSTEOTOMY Bilateral ~ 1980   removed part of 5th metatarsal to corect curvature of toes   . RIGHT HEART CATH N/A 05/16/2019   Procedure: RIGHT HEART CATH;  Surgeon: Jolaine Artist, MD;  Location: [place] [facility];  Service: Cardiovascular;  Laterality: N/A;  . TEE WITHOUT CARDIOVERSION  05/16/2019   Procedure: Transesophageal Echocardiogram (Tee);  Surgeon: Constance Haw, MD;  Location: [place] [facility];  Service: Cardiovascular;;  . TEE WITHOUT CARDIOVERSION N/A 03/07/2020   Procedure: TRANSESOPHAGEAL ECHOCARDIOGRAM (TEE);  Surgeon: Freada Bergeron, MD;  Location: [facility] ENDOSCOPY;  Service: Cardiovascular;  Laterality: N/A;    Family History  Problem Relation Age of Onset  . Clotting disorder Mother   . Heart disease Mother        s/p MI  . Heart attack Mother   . Hypertension Mother   . Diabetes Mother   . Hyperlipidemia Mother   . Stroke Mother   . Cancer Father        ? lung  . Lung disease Father        smoker  . Diabetes Sister   . Hypertension Sister        smoker  . Leukemia Maternal Grandmother        ?  [name] Aneurysm Sister        brain  . Other Sister        clipped  . Seizures Sister        d/o w/aneurysm/ smoker    Social History   Socioeconomic History  . Marital status: Married    Spouse name: Not on file  . Number of children: 4  . Years of education: 19  . Highest education level: High school graduate  Occupational  History  . Occupation: retired    Fish farm manager: [employer]  Tobacco Use  . Smoking status: Former Smoker    Packs/day: 1.00    Years: 10.00    Pack years: 10.00    Quit date: 06/07/1980    Years since quitting: 40.2  . Smokeless tobacco: Never Used  Vaping Use  . Vaping Use: Never used  Substance and Sexual Activity  . Alcohol use: No  . Drug use: No  . Sexual activity: Not Currently  Other Topics Concern  . Not on file  Social History Narrative   Lives with male partner in a one story home.  His son lives there off and on.  Retired from [employer].  Education: high school.  Right handed   Social Determinants of Health   Financial Resource Strain: Not on file  Food Insecurity: Not on file  Transportation Needs: Not on file  Physical Activity: Not on file  Stress: Not on file  Social Connections: Not on file  Intimate Partner  Violence: Not on file    Outpatient Medications Prior to Visit  Medication Sig Dispense Refill  . albuterol (VENTOLIN HFA) 108 (90 Base) MCG/ACT inhaler Inhale 2 puffs into the lungs every 6 (six) hours as needed for wheezing or shortness of breath. 1 Inhaler 6  . Alcohol Swabs 70 % PADS Use as directed 200 each 1  . allopurinol (ZYLOPRIM) 300 MG tablet Take 1 tablet by mouth once daily 90 tablet 1  . Blood Glucose Monitoring Suppl (TRUE METRIX AIR GLUCOSE METER) w/Device KIT Use to check blood sugar 3 times a day.  Dx code: E11.9 1 kit 0  . calcium carbonate (TUMS - DOSED IN MG ELEMENTAL CALCIUM) 500 MG chewable tablet Chew 500 mg by mouth daily as needed for indigestion or heartburn.    . cetirizine (ZYRTEC) 10 MG tablet Take 1 tablet (10 mg total) by mouth daily. 30 tablet 11  . Clobetasol Prop Emollient Base (CLOBETASOL PROPIONATE E) 0.05 % emollient cream Apply 1 application topically 2 (two) times daily as needed (insect bite). 60 g 1  . colchicine (COLCRYS) 0.6 MG tablet Take one tablet for 4-6 days as needed for gout flare up. 15 tablet 3  . ELIQUIS 5  MG TABS tablet Take 1 tablet by mouth twice daily 180 tablet 0  . FARXIGA 10 MG TABS tablet TAKE 1 TABLET BY MOUTH ONCE DAILY (APPOINTMENT  NEEDED  FOR  FURTHER  REFILLS) 30 tablet 3  . fish oil-omega-3 fatty acids 1000 MG capsule Take 1 g by mouth daily.    . furosemide (LASIX) 80 MG tablet Take 1 tablet (80 mg total) by mouth daily. As needed patient may take an additional 40 MG tablet as direct per Alleviate Research study PRN plan. 95 tablet 3  . gabapentin (NEURONTIN) 100 MG capsule Take 2 capsules (200 mg total) by mouth at bedtime. 180 capsule 3  . glucose blood (TRUE METRIX BLOOD GLUCOSE TEST) test strip Use to check blood sugar 3 times a day.  Dx code: E11.9 300 each 1  . glyBURIDE (DIABETA) 5 MG tablet Take 1 tablet by mouth daily in the morning and 1/2 tablet by mouth every evening 135 tablet 1  . hydrALAZINE (APRESOLINE) 25 MG tablet Take 1 tablet (25 mg total) by mouth 3 (three) times daily. 270 tablet 2  . HYDROcodone-acetaminophen (NORCO) 10-325 MG tablet Take 1 tablet by mouth every 8 (eight) hours as needed for moderate pain or severe pain. 90 tablet 0  . hydrocortisone 2.5 % ointment Apply topically 2 (two) times daily as needed. 30 g 1  . isosorbide mononitrate (IMDUR) 30 MG 24 hr tablet Take 1 tablet (30 mg total) by mouth daily. Needs appt 90 tablet 0  . LORazepam (ATIVAN) 1 MG tablet TAKE 1 TABLET BY MOUTH EVERY 8 HOURS AS NEEDED FOR ANXIETY 30 tablet 2  . magnesium oxide (MAG-OX) 400 MG tablet Take 1 tablet (400 mg total) by mouth 2 (two) times daily. 30 tablet 11  . metoprolol succinate (TOPROL-XL) 50 MG 24 hr tablet Take 1 tablet (50 mg total) by mouth 2 (two) times daily. Take with or immediately following a meal. 180 tablet 3  . Multiple Vitamin (MULTIVITAMIN WITH MINERALS) TABS tablet Take 1 tablet by mouth daily.     . naproxen (NAPROSYN) 375 MG tablet Take 375 mg by mouth 2 (two) times daily as needed (knee pain.).     [name] Polyethyl Glycol-Propyl Glycol (SYSTANE OP) Place  1 drop into both eyes daily as  needed (dry eyes).    . potassium chloride SA (KLOR-CON) 20 MEQ tablet Take 1 tablet (20 mEq total) by mouth as directed. 1 tablet daily 20 mEq by mouth as directed per alleviate research protocol. 180 tablet 1  . pravastatin (PRAVACHOL) 40 MG tablet Take 1 tablet by mouth once daily 90 tablet 3  . tamsulosin (FLOMAX) 0.4 MG CAPS capsule Take 1 capsule by mouth once daily 30 capsule 0  . temazepam (RESTORIL) 30 MG capsule Take 1 capsule by mouth at bedtime 30 capsule 5  . TRUEplus Lancets 33G MISC Use to check blood sugar 3 times a day.  Dx code: E11.9 300 each 1   No facility-administered medications prior to visit.    Allergies  Allergen Reactions  . Sulfonamide Derivatives Other (See Comments)    Flu like symptoms   . Zolpidem Other (See Comments)    Excessive, prolonged sedation    Review of Systems  Constitutional: Negative for fever and malaise/fatigue.  HENT: Negative for congestion.   Eyes: Negative for blurred vision.  Respiratory: Negative for shortness of breath.   Cardiovascular: Negative for chest pain, palpitations and leg swelling.  Gastrointestinal: Negative for abdominal pain, blood in stool and nausea.  Genitourinary: Negative for dysuria and frequency.  Musculoskeletal: Negative for falls.  Skin: Negative for rash.  Neurological: Negative for dizziness, loss of consciousness and headaches.  Endo/Heme/Allergies: Negative for environmental allergies.  Psychiatric/Behavioral: Negative for depression. The patient is not nervous/anxious.        Objective:    Physical Exam  Unable to obtain via phone BP 104/63   Pulse (!) 59   Temp (!) 96.8 F (36 C)   Wt 185 lb (83.9 kg)   SpO2 98%   BMI 25.80 kg/m  Wt Readings from Last 3 Encounters:  09/11/20 185 lb (83.9 kg)  07/22/20 196 lb 9.6 oz (89.2 kg)  07/09/20 198 lb (89.8 kg)    Diabetic Foot Exam - Simple   No data filed    Lab Results  Component Value Date   WBC 4.1  05/20/2020   HGB 15.1 05/20/2020   HCT 47.0 05/20/2020   PLT 197 05/20/2020   GLUCOSE 142 (H) 05/20/2020   CHOL 122 05/22/2020   TRIG 99.0 05/22/2020   HDL 37.50 (L) 05/22/2020   LDLDIRECT 84.0 05/02/2018   LDLCALC 65 05/22/2020   ALT 23 07/23/2019   AST 17 07/23/2019   NA 138 05/20/2020   K 4.4 05/20/2020   CL 102 05/20/2020   CREATININE 1.87 (H) 05/20/2020   BUN 33 (H) 05/20/2020   CO2 23 05/20/2020   TSH 1.93 05/22/2020   PSA 10.76 (H) 02/06/2019   INR 2.2 09/06/2018   HGBA1C 6.3 (A) 07/22/2020   MICROALBUR 1.1 04/14/2015    Lab Results  Component Value Date   TSH 1.93 05/22/2020   Lab Results  Component Value Date   WBC 4.1 05/20/2020   HGB 15.1 05/20/2020   HCT 47.0 05/20/2020   MCV 79 05/20/2020   PLT 197 05/20/2020   Lab Results  Component Value Date   NA 138 05/20/2020   K 4.4 05/20/2020   CO2 23 05/20/2020   GLUCOSE 142 (H) 05/20/2020   BUN 33 (H) 05/20/2020   CREATININE 1.87 (H) 05/20/2020   BILITOT 0.7 07/23/2019   ALKPHOS 56 07/23/2019   AST 17 07/23/2019   ALT 23 07/23/2019   PROT 6.8 07/23/2019   ALBUMIN 4.0 07/23/2019   CALCIUM 9.4 05/20/2020   ANIONGAP 10  11/26/2019   GFR 43.05 (L) 08/02/2019   Lab Results  Component Value Date   CHOL 122 05/22/2020   Lab Results  Component Value Date   HDL 37.50 (L) 05/22/2020   Lab Results  Component Value Date   LDLCALC 65 05/22/2020   Lab Results  Component Value Date   TRIG 99.0 05/22/2020   Lab Results  Component Value Date   CHOLHDL 3 05/22/2020   Lab Results  Component Value Date   HGBA1C 6.3 (A) 07/22/2020       Assessment & Plan:   Problem List Items Addressed This Visit    Diabetes mellitus type 2 in obese ([place])    hgba1c acceptable, minimize simple carbs. Increase exercise as tolerated. Continue current meds. Follows with Dr [name]. Referred to podiatry      Relevant Orders   Ambulatory referral to Podiatry   Anemia    Patient requests ABO/RH level checked       CHEST PAIN-UNSPECIFIED   Relevant Orders   ABO AND RH    Hyperlipidemia     encouraged heart healthy diet, avoid trans fats, minimize simple carbs and saturated fats. Increase exercise as tolerated      Gout    Hydrate and monitor      RESOLVED: Essential hypertension, malignant    Well controlled, no changes to meds. Encouraged heart healthy diet such as the DASH diet and exercise as tolerated.       Chronic renal insufficiency    Hydrate and monitor      Atrial fibrillation ([place])    Doing great and off of Tikosyn. He is seeing cardiology q 6 month.       Relevant Orders   ABO AND RH    Chronic thumb pain, left - Primary    Encouraged moist heat and gentle stretching as tolerated. May try Tylenol and prescription meds as directed and report if symptoms worsen or seek immediate care. Check an xray      Relevant Orders   DG Finger Thumb Left      I am having [name] maintain his fish oil-omega-3 fatty acids, cetirizine, albuterol, HYDROcodone-acetaminophen, multivitamin with minerals, naproxen, tamsulosin, magnesium oxide, Clobetasol Prop Emollient Base, hydrocortisone, potassium chloride SA, metoprolol succinate, calcium carbonate, Polyethyl Glycol-Propyl Glycol (SYSTANE OP), hydrALAZINE, colchicine, glyBURIDE, temazepam, True Metrix Air Glucose Meter, True Metrix Blood Glucose Test, TRUEplus Lancets 33G, Alcohol Swabs, furosemide, allopurinol, gabapentin, Farxiga, Eliquis, isosorbide mononitrate, pravastatin, and LORazepam.  No orders of the defined types were placed in this encounter.    I discussed the assessment and treatment plan with the patient. The patient was provided an opportunity to ask questions and all were answered. The patient agreed with the plan and demonstrated an understanding of the instructions.   The patient was advised to call back or seek an in-person evaluation if the symptoms worsen or if the condition fails to improve as  anticipated.  I provided 26 minutes of non-face-to-face time during this encounter.   Penni Homans, MD [facility] [phone] (phone) [phone] (fax)  [place]

## 2020-09-14 NOTE — Assessment & Plan Note (Signed)
Patient requests ABO/RH level checked

## 2020-09-14 NOTE — Assessment & Plan Note (Signed)
encouraged heart healthy diet, avoid trans fats, minimize simple carbs and saturated fats. Increase exercise as tolerated 

## 2020-09-14 NOTE — Assessment & Plan Note (Signed)
Hydrate and monitor 

## 2020-09-16 ENCOUNTER — Other Ambulatory Visit: Payer: Self-pay

## 2020-09-16 ENCOUNTER — Ambulatory Visit (INDEPENDENT_AMBULATORY_CARE_PROVIDER_SITE_OTHER)
Admission: RE | Admit: 2020-09-16 | Discharge: 2020-09-16 | Disposition: A | Discharge status: Home / Self Care | Payer: Medicare HMO | Source: Ambulatory Visit | Attending: Family Medicine | Admitting: Family Medicine

## 2020-09-16 ENCOUNTER — Other Ambulatory Visit: Payer: Medicare HMO

## 2020-09-16 DIAGNOSIS — M19042 Primary osteoarthritis, left hand: Secondary | ICD-10-CM | POA: Diagnosis not present

## 2020-09-16 DIAGNOSIS — M79645 Pain in left finger(s): Secondary | ICD-10-CM

## 2020-09-16 DIAGNOSIS — G8929 Other chronic pain: Secondary | ICD-10-CM | POA: Diagnosis not present

## 2020-09-16 DIAGNOSIS — R079 Chest pain, unspecified: Secondary | ICD-10-CM

## 2020-09-16 DIAGNOSIS — I4891 Unspecified atrial fibrillation: Secondary | ICD-10-CM | POA: Diagnosis not present

## 2020-09-17 LAB — ABO AND RH

## 2020-09-18 DIAGNOSIS — Z006 Encounter for examination for normal comparison and control in clinical research program: Secondary | ICD-10-CM

## 2020-09-18 NOTE — Research (Addendum)
Alleviate Research Study  [facility] Transmission      Appears to be maintaining normal sinus rhythm over the last 3 months, good ventricular rates.  Stable activity level.

## 2020-10-12 DIAGNOSIS — G4733 Obstructive sleep apnea (adult) (pediatric): Secondary | ICD-10-CM | POA: Diagnosis not present

## 2020-10-21 ENCOUNTER — Ambulatory Visit: Payer: Medicare HMO | Admitting: Internal Medicine

## 2020-10-21 ENCOUNTER — Other Ambulatory Visit: Payer: Self-pay

## 2020-10-21 ENCOUNTER — Encounter: Payer: Self-pay | Admitting: Internal Medicine

## 2020-10-21 VITALS — BP 128/80 | HR 85 | Ht 71.0 in | Wt 193.0 lb

## 2020-10-21 DIAGNOSIS — G63 Polyneuropathy in diseases classified elsewhere: Secondary | ICD-10-CM | POA: Diagnosis not present

## 2020-10-21 DIAGNOSIS — E663 Overweight: Secondary | ICD-10-CM

## 2020-10-21 DIAGNOSIS — E782 Mixed hyperlipidemia: Secondary | ICD-10-CM | POA: Diagnosis not present

## 2020-10-21 DIAGNOSIS — E669 Obesity, unspecified: Secondary | ICD-10-CM

## 2020-10-21 DIAGNOSIS — E1169 Type 2 diabetes mellitus with other specified complication: Secondary | ICD-10-CM

## 2020-10-21 LAB — POCT GLYCOSYLATED HEMOGLOBIN (HGB A1C): Hemoglobin A1C: 6.9 % — AB (ref 4.0–5.6)

## 2020-10-21 MED ORDER — ACCU-CHEK SOFTCLIX LANCETS MISC: 3 refills | Status: AC

## 2020-10-21 MED ORDER — GLYBURIDE 5 MG PO TABS: ORAL_TABLET | ORAL | 1 refills | Status: DC

## 2020-10-21 MED ORDER — DAPAGLIFLOZIN PROPANEDIOL 10 MG PO TABS: ORAL_TABLET | ORAL | 3 refills | Status: DC

## 2020-10-21 MED ORDER — ACCU-CHEK GUIDE VI STRP: ORAL_STRIP | 3 refills | Status: DC

## 2020-10-21 MED ORDER — ACCU-CHEK GUIDE ME W/DEVICE KIT: PACK | 0 refills | Status: DC

## 2020-10-21 NOTE — Progress Notes (Signed)
Patient ID: [name], male   DOB: [birth year], 70 y.o.   MRN: [id number]  This visit occurred during the SARS-CoV-2 public health emergency.  Safety protocols were in place, including screening questions prior to the visit, additional usage of staff PPE, and extensive cleaning of exam room while observing appropriate contact time as indicated for disinfecting solutions.   HPI: [name] is a 70 y.o.-year-old male, initially referred by his cardiologist, Dr. Sallyanne Kuster, returning for follow-up DM2, dx in 2011, non-insulin-dependent, uncontrolled, with complications (CHF, CKD, ED).  Last visit 3 months ago.  Interim hx: No increased urination, blurry vision, nausea. Before last OV, he decreased baked foods, started meditation.  His sugars improved significantly. They worsened a little since then >> started intermittent fasting (eating window 10 am - 6 pm) and added a Glyburide 2.5 mg before most dinners.   Reviewed HbA1c levels Lab Results  Component Value Date   HGBA1C 6.3 (A) 07/22/2020   HGBA1C 8.1 (A) 04/18/2020   HGBA1C 6.6 (A) 12/14/2019   HGBA1C 5.3 09/14/2019   HGBA1C 6.4 06/11/2019   HGBA1C 5.8 02/06/2019   HGBA1C 5.7 08/03/2018   HGBA1C 7.0 (H) 05/02/2018   HGBA1C 6.7 (H) 01/24/2018   HGBA1C 6.2 10/21/2017   HGBA1C 7.2 (H) 03/18/2017   HGBA1C 7.0 (H) 11/15/2016   HGBA1C 7.5 (H) 08/20/2016   HGBA1C 6.8 (H) 05/21/2016   HGBA1C 6.7 (H) 02/18/2016   HGBA1C 6.4 11/17/2015   HGBA1C 6.4 07/15/2015   HGBA1C 6.6 (H) 04/14/2015   HGBA1C 6.1 01/07/2015   HGBA1C 6.8 (H) 09/13/2014   HGBA1C 6.8 (H) 06/13/2014   HGBA1C 6.6 (H) 01/09/2014   HGBA1C 6.4 (H) 10/08/2013   HGBA1C 7.0 (H) 07/11/2013   HGBA1C 6.6 (H) 01/31/2013   HGBA1C 5.9 (H) 10/31/2012   HGBA1C 6.8 (H) 06/22/2012   HGBA1C 6.7 (H) 03/08/2012   HGBA1C 6.6 (H) 11/04/2011   HGBA1C 7.3 (H) 07/22/2011   Pt is on a regimen of: - Farxiga 10 mg daily-started by Dr. Haroldine Laws - Glyburide 5 >> 2.5 mg after b'fast  and 0-2.5 mg 2h after dinner >> 0.5 tab as needed before a large meal - took it 2x since last visit He was not able to start Ozempic 0.5 mg- 04/2020 - due to price. He was on Metformin in the past.  Pt checks his sugars once a day: - am: 130, 142-199 >> 95-142, 154, 170 >> 97- 179 (last week: 97- 118) - 2h after b'fast: n/c - before lunch: n/c - 2h after lunch: n/c >> 165 >> n/c  - before dinner: n/c >> 170 >> 109, 118 >> 81-133, 235 - 2h after dinner: n/c >> 142-175 >> 109, 122, 144 - bedtime: 68-155, 176, 376  >> 154, 157, 200 >> n/c - nighttime: n/c Lowest sugar was 60 >> 100 >> 130; he has hypoglycemia awareness in the 50s.  Highest sugar was 376 (cheese cake) >>... 199 >> 175.  Glucometer: One Touch verio IQ  Pt's meals are: - Breakfast: oatmeal + berries/apples + cinnamon, cereal, fruit - Lunch: light meal - Dinner: chicken or fish, ground [place] + veggies, occasionally fried foods (1x a mo) - Snacks: cranberry juice, grape juice, aple juice >> diluted by at least 50%.  -+ CKD-sees Dr. Juleen China, last BUN/creatinine:  Lab Results  Component Value Date   BUN 33 (H) 05/20/2020   BUN 29 (H) 02/25/2020   CREATININE 1.87 (H) 05/20/2020   CREATININE 2.04 (H) 02/25/2020   Lab Results  Component  Value Date   GFRAA 42 (L) 05/20/2020   GFRAA 38 (L) 02/25/2020   GFRAA 36 (L) 11/26/2019   GFRAA 39 (L) 11/12/2019   GFRAA 36 (L) 09/18/2019   GFRAA 37 (L) 08/30/2019   GFRAA 39 (L) 08/14/2019   GFRAA 45 (L) 07/11/2019   GFRAA 51 (L) 05/29/2019   GFRAA 58 (L) 05/28/2019  He is not on ACE inhibitor/ARB.  -+ HL; last set of lipids: Lab Results  Component Value Date   CHOL 122 05/22/2020   HDL 37.50 (L) 05/22/2020   LDLCALC 65 05/22/2020   LDLDIRECT 84.0 05/02/2018   TRIG 99.0 05/22/2020   CHOLHDL 3 05/22/2020  On pravastatin 40, omega-3 fatty acids.  - last eye exam was 09/03/2020: Reportedly no DR, + glaucoma. Dr. Manuella Ghazi.  -+ numbness and tingling in his feet.Dr. Sallyanne Kuster  started him on Gabapentin 100 mg daily.  At last visit we increased the dose to 200 mg daily.  Pt has FH of DM in mother, sister.  He is on amiodarone.  No FH of MTC. No personal hx of pancreatitis.  ROS: Constitutional: no weight gain/no weight loss, no fatigue, no subjective hyperthermia, no subjective hypothermia Eyes: no blurry vision, no xerophthalmia ENT: no sore throat, no nodules palpated in neck, no dysphagia, no odynophagia, no hoarseness Cardiovascular: no CP/no SOB/no palpitations/no leg swelling Respiratory: no cough/no SOB/no wheezing Gastrointestinal: no N/no V/no D/no C/no acid reflux Musculoskeletal: no muscle aches/no joint aches Skin: no rashes, no hair loss Neurological: + tremors/no numbness/no tingling/no dizziness  I reviewed pt's medications, allergies, PMH, social hx, family hx, and changes were documented in the history of present illness. Otherwise, unchanged from my initial visit note.  Past Medical History:  Diagnosis Date  . AORTIC STENOSIS, MODERATE 12/05/2009  . Atrial fibrillation ([place])   . Atrial fibrillation with RVR ([place]) 07/05/2017  . Atrial flutter ([place]) 12/05/2009  . Atypical chest pain 11/08/2011  . BENIGN PROSTATIC HYPERTROPHY, HX OF 12/05/2009  . CHEST PAIN-UNSPECIFIED 11/11/2009  . CHF 12/05/2009  . Chronic renal insufficiency 12/16/2016  . Debility 04/18/2012  . Diarrhea 12/16/2016  . DYSPNEA 12/19/2009  . Edema 04/18/2012  . ERECTILE DYSFUNCTION 01/25/2007  . FATIGUE, CHRONIC 11/11/2009  . Gout 07/10/2012  . Heart murmur   . History of kidney stones    "they passed" (07/05/2017)  . Hyperkalemia 04/18/2012  . Hyperlipidemia   . Hypertension   . HYPERTENSION 01/25/2007  . Hypoxia 05/05/2011  . Insomnia 05/13/2016  . INSOMNIA, HX OF 01/25/2007  . Knee pain, bilateral 12/28/2010  . Knee pain, right 12/28/2010  . NEUROMA 01/05/2010  . OBESITY NOS 01/25/2007  . OSA on CPAP 04/06/2010  . PERIPHERAL NEUROPATHY 01/05/2010  . PULMONARY FUNCTION TESTS,  ABNORMAL 02/02/2010  . Superficial thrombophlebitis of left leg 09/07/2011  . TESTICULAR HYPOFUNCTION 01/05/2010  . 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 Toe pain, right 07/10/2012  . Type II diabetes mellitus ([place]) 12/05/2009  . UNSPECIFIED ANEMIA 01/05/2010   Past Surgical History:  Procedure Laterality Date  . A-FLUTTER ABLATION N/A 05/16/2019   Procedure: A-FLUTTER ABLATION;  Surgeon: Constance Haw, MD;  Location: [facility];  Service: Cardiovascular;  Laterality: N/A;  . ATRIAL FIBRILLATION ABLATION N/A 05/16/2019   Procedure: ATRIAL FIBRILLATION ABLATION;  Surgeon: Constance Haw, MD;  Location: [facility];  Service: Cardiovascular;  Laterality: N/A;  . ATRIAL FIBRILLATION ABLATION N/A 03/10/2020   Procedure: ATRIAL FIBRILLATION ABLATION;  Surgeon: Constance Haw, MD;  Location: [facility];  Service: Cardiovascular;  Laterality: N/A;  . CARDIAC CATHETERIZATION  10/16/2009   nonischemic cardiomyopathy  . CARDIOVERSION N/A 03/25/2015   Procedure: CARDIOVERSION;  Surgeon: Pixie Casino, MD;  Location: [facility];  Service: Cardiovascular;  Laterality: N/A;  . CARDIOVERSION N/A 06/17/2017   Procedure: CARDIOVERSION;  Surgeon: Sanda Klein, MD;  Location: [facility];  Service: Cardiovascular;  Laterality: N/A;  . CARDIOVERSION N/A 07/08/2017   Procedure: CARDIOVERSION;  Surgeon: Fay Records, MD;  Location: [facility];  Service: Cardiovascular;  Laterality: N/A;  . CARDIOVERSION N/A 02/27/2019   Procedure: CARDIOVERSION;  Surgeon: Sanda Klein, MD;  Location: [facility];  Service: Cardiovascular;  Laterality: N/A;  . CARDIOVERSION N/A 08/16/2019   Procedure: CARDIOVERSION;  Surgeon: Sanda Klein, MD;  Location: [facility];  Service: Cardiovascular;  Laterality: N/A;  . METATARSAL OSTEOTOMY Bilateral ~ 1980   removed part of 5th metatarsal to corect curvature of toes   . RIGHT HEART CATH N/A 05/16/2019   Procedure: RIGHT HEART CATH;  Surgeon:  Jolaine Artist, MD;  Location: [facility];  Service: Cardiovascular;  Laterality: N/A;  . TEE WITHOUT CARDIOVERSION  05/16/2019   Procedure: Transesophageal Echocardiogram (Tee);  Surgeon: Constance Haw, MD;  Location: [facility];  Service: Cardiovascular;;  . TEE WITHOUT CARDIOVERSION N/A 03/07/2020   Procedure: TRANSESOPHAGEAL ECHOCARDIOGRAM (TEE);  Surgeon: Freada Bergeron, MD;  [facility];  Service: Cardiovascular;  Laterality: N/A;   Social History   Socioeconomic History  . Marital status: Married    Spouse name: Not on file  . Number of children: 4  . Years of education: 1  . Highest education level: High school graduate  Occupational History  . Occupation: retired    Fish farm manager: [employer]  Tobacco Use  . Smoking status: Former Smoker    Packs/day: 1.00    Years: 10.00    Pack years: 10.00    Quit date: 06/07/1980    Years since quitting: 40.4  . Smokeless tobacco: Never Used  Vaping Use  . Vaping Use: Never used  Substance and Sexual Activity  . Alcohol use: No  . Drug use: No  . Sexual activity: Not Currently  Other Topics Concern  . Not on file  Social History Narrative   Lives with male partner in a one story home.  His son lives there off and on.  Retired from [employer].  Education: high school.  Right handed   Social Determinants of Health   Financial Resource Strain: Not on file  Food Insecurity: Not on file  Transportation Needs: Not on file  Physical Activity: Not on file  Stress: Not on file  Social Connections: Not on file  Intimate Partner Violence: Not on file   Current Outpatient Medications on File Prior to Visit  Medication Sig Dispense Refill  . albuterol (VENTOLIN HFA) 108 (90 Base) MCG/ACT inhaler Inhale 2 puffs into the lungs every 6 (six) hours as needed for wheezing or shortness of breath. 1 Inhaler 6  . Alcohol Swabs 70 % PADS Use as directed 200 each 1  . allopurinol (ZYLOPRIM) 300 MG tablet  Take 1 tablet by mouth once daily 90 tablet 1  . Blood Glucose Monitoring Suppl (TRUE METRIX AIR GLUCOSE METER) w/Device KIT Use to check blood sugar 3 times a day.  Dx code: E11.9 1 kit 0  . calcium carbonate (TUMS - DOSED IN MG ELEMENTAL CALCIUM) 500 MG chewable tablet Chew 500 mg by mouth daily as needed for indigestion or  heartburn.    . cetirizine (ZYRTEC) 10 MG tablet Take 1 tablet (10 mg total) by mouth daily. 30 tablet 11  . Clobetasol Prop Emollient Base (CLOBETASOL PROPIONATE E) 0.05 % emollient cream Apply 1 application topically 2 (two) times daily as needed (insect bite). 60 g 1  . colchicine (COLCRYS) 0.6 MG tablet Take one tablet for 4-6 days as needed for gout flare up. 15 tablet 3  . ELIQUIS 5 MG TABS tablet Take 1 tablet by mouth twice daily 180 tablet 0  . FARXIGA 10 MG TABS tablet TAKE 1 TABLET BY MOUTH ONCE DAILY (APPOINTMENT  NEEDED  FOR  FURTHER  REFILLS) 30 tablet 3  . fish oil-omega-3 fatty acids 1000 MG capsule Take 1 g by mouth daily.    . furosemide (LASIX) 80 MG tablet Take 1 tablet (80 mg total) by mouth daily. As needed patient may take an additional 40 MG tablet as direct per Alleviate Research study PRN plan. 95 tablet 3  . gabapentin (NEURONTIN) 100 MG capsule Take 2 capsules (200 mg total) by mouth at bedtime. 180 capsule 3  . glucose blood (TRUE METRIX BLOOD GLUCOSE TEST) test strip Use to check blood sugar 3 times a day.  Dx code: E11.9 300 each 1  . glyBURIDE (DIABETA) 5 MG tablet Take 1 tablet by mouth daily in the morning and 1/2 tablet by mouth every evening 135 tablet 1  . hydrALAZINE (APRESOLINE) 25 MG tablet Take 1 tablet (25 mg total) by mouth 3 (three) times daily. 270 tablet 2  . HYDROcodone-acetaminophen (NORCO) 10-325 MG tablet Take 1 tablet by mouth every 8 (eight) hours as needed for moderate pain or severe pain. 90 tablet 0  . hydrocortisone 2.5 % ointment Apply topically 2 (two) times daily as needed. 30 g 1  . isosorbide mononitrate (IMDUR) 30 MG  24 hr tablet Take 1 tablet (30 mg total) by mouth daily. Needs appt 90 tablet 0  . LORazepam (ATIVAN) 1 MG tablet TAKE 1 TABLET BY MOUTH EVERY 8 HOURS AS NEEDED FOR ANXIETY 30 tablet 2  . magnesium oxide (MAG-OX) 400 MG tablet Take 1 tablet (400 mg total) by mouth 2 (two) times daily. 30 tablet 11  . metoprolol succinate (TOPROL-XL) 50 MG 24 hr tablet Take 1 tablet (50 mg total) by mouth 2 (two) times daily. Take with or immediately following a meal. 180 tablet 3  . Multiple Vitamin (MULTIVITAMIN WITH MINERALS) TABS tablet Take 1 tablet by mouth daily.     . naproxen (NAPROSYN) 375 MG tablet Take 375 mg by mouth 2 (two) times daily as needed (knee pain.).     [name] Polyethyl Glycol-Propyl Glycol (SYSTANE OP) Place 1 drop into both eyes daily as needed (dry eyes).    . potassium chloride SA (KLOR-CON) 20 MEQ tablet Take 1 tablet (20 mEq total) by mouth as directed. 1 tablet daily 20 mEq by mouth as directed per alleviate research protocol. 180 tablet 1  . pravastatin (PRAVACHOL) 40 MG tablet Take 1 tablet by mouth once daily 90 tablet 3  . tamsulosin (FLOMAX) 0.4 MG CAPS capsule Take 1 capsule by mouth once daily 30 capsule 0  . temazepam (RESTORIL) 30 MG capsule Take 1 capsule by mouth at bedtime 30 capsule 5  . TRUEplus Lancets 33G MISC Use to check blood sugar 3 times a day.  Dx code: E11.9 300 each 1   No current facility-administered medications on file prior to visit.   Allergies  Allergen Reactions  . Sulfonamide  Derivatives Other (See Comments)    Flu like symptoms   . Zolpidem Other (See Comments)    Excessive, prolonged sedation   Family History  Problem Relation Age of Onset  . Clotting disorder Mother   . Heart disease Mother        s/p MI  . Heart attack Mother   . Hypertension Mother   . Diabetes Mother   . Hyperlipidemia Mother   . Stroke Mother   . Cancer Father        ? lung  . Lung disease Father        smoker  . Diabetes Sister   . Hypertension Sister        smoker   . Leukemia Maternal Grandmother        ?  [name] Aneurysm Sister        brain  . Other Sister        clipped  . Seizures Sister        d/o w/aneurysm/ smoker   PE: BP 128/80 (BP Location: Right Arm, Patient Position: Sitting, Cuff Size: Normal)   Pulse 85   Ht $R'5\' 11"'uE$  (1.803 m)   Wt 193 lb (87.5 kg)   SpO2 96%   BMI 26.92 kg/m  Wt Readings from Last 3 Encounters:  10/21/20 193 lb (87.5 kg)  09/11/20 185 lb (83.9 kg)  07/22/20 196 lb 9.6 oz (89.2 kg)   Constitutional: overweight, in NAD Eyes: PERRLA, EOMI, no exophthalmos ENT: moist mucous membranes, no thyromegaly, no cervical lymphadenopathy Cardiovascular: RRR, No MRG Respiratory: CTA B Gastrointestinal: abdomen soft, NT, ND, BS+ Musculoskeletal: no deformities, strength intact in all 4 Skin: moist, warm, no rashes Neurological: no tremor with outstretched hands, DTR normal in all 4  ASSESSMENT: 1. DM2, non-insulin-dependent, uncontrolled, with long-term complications - s and d CHF - A fib - s/p cardioversion - 05/2019 >> went into cardiac shock - CKD - sees nephrology  2.  Overweight  3. HL  4.  Peripheral neuropathy  PLAN:  1. Patient with longstanding, previously uncontrolled type 2 diabetes, on an SGLT2 inhibitor.  He also has sulfonylurea as needed before a larger meal.  We did try to add Ozempic but he was not able to start it due to price.  He did start to improve his diet and sugars improved significantly before last visit, with an HbA1c of 6.3%. -At this visit, sugars have improved significantly and are almost 100% at goal in the last week, but they were higher in the week before.  Upon questioning, he took glyburide more consistently in the last week.  We discussed about possible side effects of glyburide and we can continue for now since he is using a very low dose of 2.5 mg before certain dinners, but I advised him that if he needs to take it daily, we may need to change his regimen.  For now, we will continue  [place].  We also discussed about improving diet and I gave him references about how to do that to improve his insulin resistance. - I suggested to:  Patient Instructions  Please continue: - Farxiga 10 mg before b'fast  You may take glyburide half a tablet before a larger dinner.  Check sugars once a day, rotating check times.  Read the following books: Dr. Alyssa Grove - Program for Reversing Diabetes Dr. Karl Luke - Prevent and Reverse Heart Disease Dr. Alden Benjamin - How Not to Die   Please return in 4 months with your  sugar log.   - we checked his HbA1c: 6.9% (slightly higher) - advised to check sugars at different times of the day - 1x a day, rotating check times - advised for yearly eye exams >> he is UTD - return to clinic in 4 months  2.  Overweight -continue SGLT 2 inhibitor which should also help with weight loss -He gained 4 pounds before last visit  3. HL -Reviewed latest lipid panel from 05/2020: Fractions at goal with exception of a slightly low HDL: Lab Results  Component Value Date   CHOL 122 05/22/2020   HDL 37.50 (L) 05/22/2020   LDLCALC 65 05/22/2020   LDLDIRECT 84.0 05/02/2018   TRIG 99.0 05/22/2020   CHOLHDL 3 05/22/2020  -He continues pravastatin 40 mg daily and omega-3 fatty acids, without side effects  4.  Peripheral neuropathy -Most likely related to diabetes -At last visit numbness and tingling increased before last visit and Dr. Sallyanne Kuster added gabapentin 100 mg at bedtime. -At last visit, we increased the dose to 200 mg daily, but discussed the possible side effects including dizziness and weight gain.  At this visit, he tells me that he still taking the 100 mg tablet at night but if pain is worse, he takes a 200 mg tablet.  Philemon Kingdom, MD PhD [facility]

## 2020-10-21 NOTE — Patient Instructions (Addendum)
Please continue: - Farxiga 10 mg before b'fast  You may take glyburide half a tablet before a larger dinner.  Check sugars once a day, rotating check times.  Read the following books: Dr. Alyssa Grove - Program for Reversing Diabetes Dr. Karl Luke - Prevent and Reverse Heart Disease Dr. Alden Benjamin - How Not to Die   Please return in 4 months with your sugar log.

## 2020-11-06 DIAGNOSIS — G4733 Obstructive sleep apnea (adult) (pediatric): Secondary | ICD-10-CM | POA: Diagnosis not present

## 2020-11-11 DIAGNOSIS — G4733 Obstructive sleep apnea (adult) (pediatric): Secondary | ICD-10-CM | POA: Diagnosis not present

## 2020-11-12 DIAGNOSIS — G4733 Obstructive sleep apnea (adult) (pediatric): Secondary | ICD-10-CM | POA: Diagnosis not present

## 2020-11-16 ENCOUNTER — Other Ambulatory Visit (HOSPITAL_COMMUNITY): Payer: Self-pay | Admitting: Adult Health

## 2020-11-20 DIAGNOSIS — Z006 Encounter for examination for normal comparison and control in clinical research program: Secondary | ICD-10-CM

## 2020-11-20 MED ORDER — POTASSIUM CHLORIDE CRYS ER 20 MEQ PO TBCR
20.0000 meq | EXTENDED_RELEASE_TABLET | Freq: Every day | ORAL | 3 refills | Status: DC
Start: 2020-11-20 — End: 2020-11-20

## 2020-11-20 MED ORDER — POTASSIUM CHLORIDE CRYS ER 20 MEQ PO TBCR: EXTENDED_RELEASE_TABLET | ORAL | 3 refills | Status: DC

## 2020-11-20 NOTE — Addendum Note (Signed)
Addended by: Rubie Maid B on: 11/20/2020 01:29 PM   Modules accepted: Orders

## 2020-11-20 NOTE — Research (Signed)
Alleviate HF Research Study  18 Month follow-up  No adverse events or medication changes to report.   [id number]  MOBILITY:    I HAVE NO PROBLEMS WALKING _0   I HAVE SLIGHT PROBLEMS WALKING _1   I HAVE MODERATE PROBLEMS WALKING _2   I HAVE SEVERE PROBLEMS WALKING _3   I AM UNABLE TO WALK  _4     SELF-CARE:   I HAVE NO PROBLEMS WASHING OR DRESSING MYSELF  _5   I HAVE SLIGHT PROBLEMS WASHING OR DRESSING MYSELF  _6   I HAVE MODERATE PROBLEMS WASHING OR DRESSING MYSELF _7   I HAVE SEVERE PROBLEMS WASHING OR DRESSING MYSELF  _8   I HAVE SEVERE PROBLEMS WASHING OR DRESSING MYSELF  _9   I AM UNABLE TO WASH OR DRESS MYSELF _10     USUAL ACTIVITIES: (E.G. WORK/STUDY/HOUSEWORK/FAMILY OR LEISURE ACTIVITIES.    I HAVE NO PROBLEMS DOING MY USUAL ACTIVITIES _11   I HAVE SLIGHT PROBLEMS DOING MY USUAL ACTIVITIES _12   I HAVE MODERATE PROBLEMS DOING MY USUAL ACTIVIITIES _13   I HAVE SEVERE PROBLEMS DOING MY USUAL ACTIVITIES _14   I AM UNABLE TO DO MY USUAL ACTIVITIES _15     PAIN /DISCOMFORT   I HAVE NO PAIN OR DISCOMFORT _16   I HAVE SLIGHT PAIN OR DISCOMFORT _17   I HAVE MODERATE PAIN OR DISCOMFORT _18   I HAVE SEVERE PAIN OR DISCOMFORT _19   I HAVE EXTREME PAIN OR DISCOMFORT _20     ANXIETY/DEPRESSION   I AM NOT ANXIOUS OR DEPRESSED _21   I AM SLIGHTLY ANXIOUS OR DEPRESSED _22   I AM MODERATELY ANXIOUS OR DREPRESSED _23   I AM SEVERELY ANXIOUS OR DEPRESSED _24   I AM EXTREMELY ANXIOUS OR DEPRESSED _25     SCALE OF 0-100 HOW WOULD YOU RATE TODAY?  0 IS THE WORSE AND 100 IS THE BEST HEALTH YOU CAN IMAGINE: 80     Current Outpatient Medications:    Accu-Chek Softclix Lancets lancets, Use as instructed, Disp: 200 each, Rfl: 3   albuterol (VENTOLIN HFA) 108 (90 Base) MCG/ACT inhaler, Inhale 2 puffs into the lungs every 6 (six) hours as needed for wheezing or shortness of breath., Disp: 1 Inhaler, Rfl: 6   Alcohol Swabs 70 % PADS, Use as directed, Disp: 200 each, Rfl: 1   allopurinol (ZYLOPRIM) 300 MG tablet, Take 1  tablet by mouth once daily, Disp: 90 tablet, Rfl: 1   Blood Glucose Monitoring Suppl (ACCU-CHEK GUIDE ME) w/Device KIT, Use as instructed to check blood sugar 1-2 times daily., Disp: 1 kit, Rfl: 0   calcium carbonate (TUMS - DOSED IN MG ELEMENTAL CALCIUM) 500 MG chewable tablet, Chew 500 mg by mouth daily as needed for indigestion or heartburn., Disp: , Rfl:    cetirizine (ZYRTEC) 10 MG tablet, Take 1 tablet (10 mg total) by mouth daily., Disp: 30 tablet, Rfl: 11   Clobetasol Prop Emollient Base (CLOBETASOL PROPIONATE E) 0.05 % emollient cream, Apply 1 application topically 2 (two) times daily as needed (insect bite)., Disp: 60 g, Rfl: 1   colchicine (COLCRYS) 0.6 MG tablet, Take one tablet for 4-6 days as needed for gout flare up., Disp: 15 tablet, Rfl: 3   dapagliflozin propanediol (FARXIGA) 10 MG TABS tablet, TAKE 1 TABLET BY MOUTH ONCE DAILY, Disp: 90 tablet, Rfl: 3   ELIQUIS 5 MG TABS tablet, Take 1 tablet by mouth twice daily, Disp: 180 tablet, Rfl: 0   fish oil-omega-3 fatty acids 1000 MG capsule, Take 1 g by mouth daily., Disp: , Rfl:    furosemide (LASIX) 80 MG tablet, Take 1 tablet (80  mg total) by mouth daily. As needed patient may take an additional 40 MG tablet as direct per Alleviate Research study PRN plan., Disp: 95 tablet, Rfl: 3   gabapentin (NEURONTIN) 100 MG capsule, Take 2 capsules (200 mg total) by mouth at bedtime., Disp: 180 capsule, Rfl: 3   glucose blood (ACCU-CHEK GUIDE) test strip, Use as instructed to check blood sugar 1-2 times daily, Disp: 200 each, Rfl: 3   glyBURIDE (DIABETA) 5 MG tablet, Take 1/2 tablet by mouth before dinner, Disp: 90 tablet, Rfl: 1   hydrALAZINE (APRESOLINE) 25 MG tablet, Take 1 tablet (25 mg total) by mouth 3 (three) times daily., Disp: 270 tablet, Rfl: 2   HYDROcodone-acetaminophen (NORCO) 10-325 MG tablet, Take 1 tablet by mouth every 8 (eight) hours as needed for moderate pain or severe pain., Disp: 90 tablet, Rfl: 0   hydrocortisone 2.5 %  ointment, Apply topically 2 (two) times daily as needed., Disp: 30 g, Rfl: 1   isosorbide mononitrate (IMDUR) 30 MG 24 hr tablet, TAKE 1 TABLET BY MOUTH ONCE DAILY -  NEED  APPT, Disp: 30 tablet, Rfl: 0   LORazepam (ATIVAN) 1 MG tablet, TAKE 1 TABLET BY MOUTH EVERY 8 HOURS AS NEEDED FOR ANXIETY, Disp: 30 tablet, Rfl: 2   magnesium oxide (MAG-OX) 400 MG tablet, Take 1 tablet (400 mg total) by mouth 2 (two) times daily., Disp: 30 tablet, Rfl: 11   metoprolol succinate (TOPROL-XL) 50 MG 24 hr tablet, Take 1 tablet (50 mg total) by mouth 2 (two) times daily. Take with or immediately following a meal., Disp: 180 tablet, Rfl: 3   Multiple Vitamin (MULTIVITAMIN WITH MINERALS) TABS tablet, Take 1 tablet by mouth daily. , Disp: , Rfl:    naproxen (NAPROSYN) 375 MG tablet, Take 375 mg by mouth 2 (two) times daily as needed (knee pain.). , Disp: , Rfl:    Polyethyl Glycol-Propyl Glycol (SYSTANE OP), Place 1 drop into both eyes daily as needed (dry eyes)., Disp: , Rfl:    potassium chloride SA (KLOR-CON) 20 MEQ tablet, Take 1 tablet (20 mEq total) by mouth as directed. 1 tablet daily 20 mEq by mouth as directed per alleviate research protocol., Disp: 180 tablet, Rfl: 1   pravastatin (PRAVACHOL) 40 MG tablet, Take 1 tablet by mouth once daily, Disp: 90 tablet, Rfl: 3   tamsulosin (FLOMAX) 0.4 MG CAPS capsule, Take 1 capsule by mouth once daily, Disp: 30 capsule, Rfl: 0   temazepam (RESTORIL) 30 MG capsule, Take 1 capsule by mouth at bedtime, Disp: 30 capsule, Rfl: 5   NYHA Class ll most recent 07-09-2020

## 2020-11-20 NOTE — Addendum Note (Signed)
Addended by: Rubie Maid B on: 11/20/2020 01:27 PM   Modules accepted: Orders

## 2020-11-26 ENCOUNTER — Other Ambulatory Visit: Payer: Self-pay | Admitting: Family Medicine

## 2020-11-30 ENCOUNTER — Other Ambulatory Visit: Payer: Self-pay | Admitting: Cardiovascular Disease

## 2020-12-01 NOTE — Telephone Encounter (Signed)
39m, 87.5kg, scr 1.87 05/20/20, lovw/croitoru 07/09/20

## 2020-12-07 ENCOUNTER — Other Ambulatory Visit: Payer: Self-pay | Admitting: Family Medicine

## 2020-12-09 NOTE — Telephone Encounter (Signed)
Requesting: temazepam Contract: 07/31/18 UDS: 07/31/18 Last Visit: 09/11/20 Next Visit: N/A Last Refill: 06/09/20  Please Advise

## 2020-12-11 ENCOUNTER — Ambulatory Visit: Payer: Medicare HMO | Admitting: Adult Health

## 2020-12-11 ENCOUNTER — Encounter: Payer: Self-pay | Admitting: Adult Health

## 2020-12-11 ENCOUNTER — Other Ambulatory Visit: Payer: Self-pay

## 2020-12-11 DIAGNOSIS — G4733 Obstructive sleep apnea (adult) (pediatric): Secondary | ICD-10-CM

## 2020-12-11 DIAGNOSIS — I5042 Chronic combined systolic (congestive) and diastolic (congestive) heart failure: Secondary | ICD-10-CM

## 2020-12-11 NOTE — Progress Notes (Signed)
@Patient  ID: [name], male    DOB: 14-Dec-1950, 70 y.o.   MRN: [id number]  Chief Complaint  Patient presents with   Follow-up    Referring provider: [name], MD  HPI: 70 yo male followed for OSA and Insomnia  Medical history significant for nonischemic cardiomyopathy, A Fib , CHF - On Elquis  Cardioversion 02/2019 and 08/2019, Ablation 05/2019. , DM  [facility] patient   TEST/EVENTS :  2D echo July 20, 2019 EF 35 to 40%   PSG dec '11>> - weighed 225 pounds,BMI of 31, neck size of 14 inches Sleep was very fragmented due to frequent arousals and awakenings, especially in the second half of the night with CPAP. He was desensitized with a standard nasal mask. Note that he took 2 tablets of temazepam of 15 mg prior to the study. >>AHI was 42/h with lowest desatn of 86 % (no centrals noted on original study  >>corrected by CPAP of 8 cm with a nasal mask. Download 12/28 -07/02/10 >> good compliance avg 6.5h, AHI 15/h, centrals 9/h, obstructive 4.5/h, avg pr 8 cm Download on 10 cm 2/28-3/28/12 >> few residual events, some central apneas persist   PFT -> 01/2010 - TLC 77%, DLCO 56%, FVC 3.5L/72%, RAtio 77 ,FEV 1 78%    12/11/2020 Follow up : OSA, Insomnia  Patient returns for 1 year follow up . Followed for OSA on BIPAP . Uses full face mask.  Patient says he is doing well with his BiPAP.  Says he never misses a night.  Says it really helps him quite a bit he has decreased daytime sleepiness.  Feels rested.  BiPAP download shows excellent compliance with daily average usage at 7 hours.  Patient is on IPAP 16 cm H2O.  EPAP of 12 cm H2O.  AHI is 6.0.  Patient has had chronic insomnia.  Says he is doing fairly well with this.  He uses temazepam as needed for restless night.  He has no hangover effects.  Says he stays very active and eats very healthy.  Overall feels very well.  Has been following with the CHF clinic.  Says he is doing well.  No increased leg  swelling.  Allergies  Allergen Reactions   Sulfonamide Derivatives Other (See Comments)    Flu like symptoms    Zolpidem Other (See Comments)    Excessive, prolonged sedation    Immunization History  Administered Date(s) Administered   Fluad Quad(high Dose 65+) 05/22/2020   Influenza Split 03/03/2011, 03/10/2012, 04/07/2018   Influenza Whole 03/28/2008, 03/30/2010   Influenza,inj,Quad PF,6+ Mos 01/31/2013, 03/07/2014, 04/14/2015, 02/19/2016, 03/18/2017   Influenza-Unspecified 04/05/2019   PFIZER(Purple Top)SARS-COV-2 Vaccination 08/03/2019, 08/21/2019, 04/18/2020, 09/18/2020   Pneumococcal Polysaccharide-23 10/07/2009   Td 09/05/2009    Past Medical History:  Diagnosis Date   AORTIC STENOSIS, MODERATE 12/05/2009   Atrial fibrillation ([place])    Atrial fibrillation with RVR ([place]) 07/05/2017   Atrial flutter ([place]) 12/05/2009   Atypical chest pain 11/08/2011   BENIGN PROSTATIC HYPERTROPHY, HX OF 12/05/2009   CHEST PAIN-UNSPECIFIED 11/11/2009   CHF 12/05/2009   Chronic renal insufficiency 12/16/2016   Debility 04/18/2012   Diarrhea 12/16/2016   DYSPNEA 12/19/2009   Edema 04/18/2012   ERECTILE DYSFUNCTION 01/25/2007   FATIGUE, CHRONIC 11/11/2009   Gout 07/10/2012   Heart murmur    History of kidney stones    "they passed" (07/05/2017)   Hyperkalemia 04/18/2012   Hyperlipidemia    Hypertension    HYPERTENSION 01/25/2007  Hypoxia 05/05/2011   Insomnia 05/13/2016   INSOMNIA, HX OF 01/25/2007   Knee pain, bilateral 12/28/2010   Knee pain, right 12/28/2010   NEUROMA 01/05/2010   OBESITY NOS 01/25/2007   OSA on CPAP 04/06/2010   PERIPHERAL NEUROPATHY 01/05/2010   PULMONARY FUNCTION TESTS, ABNORMAL 02/02/2010   Superficial thrombophlebitis of left leg 09/07/2011   TESTICULAR HYPOFUNCTION 01/05/2010   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   Toe pain, right 07/10/2012   Type II diabetes mellitus ([place]) 12/05/2009   UNSPECIFIED ANEMIA 01/05/2010    Tobacco History: Social History   Tobacco Use  Smoking Status Former    Packs/day: 1.00   Years: 10.00   Pack years: 10.00   Types: Cigarettes   Quit date: 06/07/1980   Years since quitting: 40.5  Smokeless Tobacco Never   Counseling given: Not Answered   Outpatient Medications Prior to Visit  Medication Sig Dispense Refill   Accu-Chek Softclix Lancets lancets Use as instructed 200 each 3   albuterol (VENTOLIN HFA) 108 (90 Base) MCG/ACT inhaler Inhale 2 puffs into the lungs every 6 (six) hours as needed for wheezing or shortness of breath. 1 Inhaler 6   Alcohol Swabs (B-D SINGLE USE SWABS REGULAR) PADS USE AS DIRECTED 200 each 1   allopurinol (ZYLOPRIM) 300 MG tablet Take 1 tablet by mouth once daily 90 tablet 1   apixaban (ELIQUIS) 5 MG TABS tablet Take 1 tablet by mouth twice daily 180 tablet 1   Blood Glucose Monitoring Suppl (ACCU-CHEK GUIDE ME) w/Device KIT Use as instructed to check blood sugar 1-2 times daily. 1 kit 0   calcium carbonate (TUMS - DOSED IN MG ELEMENTAL CALCIUM) 500 MG chewable tablet Chew 500 mg by mouth daily as needed for indigestion or heartburn.     cetirizine (ZYRTEC) 10 MG tablet Take 1 tablet (10 mg total) by mouth daily. 30 tablet 11   Clobetasol Prop Emollient Base (CLOBETASOL PROPIONATE E) 0.05 % emollient cream Apply 1 application topically 2 (two) times daily as needed (insect bite). 60 g 1   colchicine (COLCRYS) 0.6 MG tablet Take one tablet for 4-6 days as needed for gout flare up. 15 tablet 3   dapagliflozin propanediol (FARXIGA) 10 MG TABS tablet TAKE 1 TABLET BY MOUTH ONCE DAILY 90 tablet 3   fish oil-omega-3 fatty acids 1000 MG capsule Take 1 g by mouth daily.     furosemide (LASIX) 80 MG tablet Take 1 tablet (80 mg total) by mouth daily. As needed patient may take an additional 40 MG tablet as direct per [facility] study PRN plan. 95 tablet 3   gabapentin (NEURONTIN) 100 MG capsule Take 2 capsules (200 mg total) by mouth at bedtime. 180 capsule 3   glucose blood (ACCU-CHEK GUIDE) test strip Use as instructed to  check blood sugar 1-2 times daily 200 each 3   glyBURIDE (DIABETA) 5 MG tablet Take 1/2 tablet by mouth before dinner 90 tablet 1   hydrALAZINE (APRESOLINE) 25 MG tablet Take 1 tablet (25 mg total) by mouth 3 (three) times daily. 270 tablet 2   HYDROcodone-acetaminophen (NORCO) 10-325 MG tablet Take 1 tablet by mouth every 8 (eight) hours as needed for moderate pain or severe pain. 90 tablet 0   hydrocortisone 2.5 % ointment Apply topically 2 (two) times daily as needed. 30 g 1   isosorbide mononitrate (IMDUR) 30 MG 24 hr tablet TAKE 1 TABLET BY MOUTH ONCE DAILY -  NEED  APPT 30 tablet 0   LORazepam (ATIVAN) 1 MG tablet  TAKE 1 TABLET BY MOUTH EVERY 8 HOURS AS NEEDED FOR ANXIETY 30 tablet 2   magnesium oxide (MAG-OX) 400 MG tablet Take 1 tablet (400 mg total) by mouth 2 (two) times daily. 30 tablet 11   metoprolol succinate (TOPROL-XL) 50 MG 24 hr tablet Take 1 tablet (50 mg total) by mouth 2 (two) times daily. Take with or immediately following a meal. 180 tablet 3   Multiple Vitamin (MULTIVITAMIN WITH MINERALS) TABS tablet Take 1 tablet by mouth daily.      naproxen (NAPROSYN) 375 MG tablet Take 375 mg by mouth 2 (two) times daily as needed (knee pain.).      Polyethyl Glycol-Propyl Glycol (SYSTANE OP) Place 1 drop into both eyes daily as needed (dry eyes).     potassium chloride SA (KLOR-CON M20) 20 MEQ tablet Take 1 tablet (20 mEq total) by mouth as directed. 1 tablet daily 20 mEq by mouth as directed per [facility] protocol. 90 tablet 3   pravastatin (PRAVACHOL) 40 MG tablet Take 1 tablet by mouth once daily 90 tablet 3   tamsulosin (FLOMAX) 0.4 MG CAPS capsule Take 1 capsule by mouth once daily 30 capsule 0   temazepam (RESTORIL) 30 MG capsule Take 1 capsule by mouth at bedtime 30 capsule 0   No facility-administered medications prior to visit.     Review of Systems:   Constitutional:   No  weight loss, night sweats,  Fevers, chills, fatigue, or  lassitude.  HEENT:   No  headaches,  Difficulty swallowing,  Tooth/dental problems, or  Sore throat,                No sneezing, itching, ear ache, nasal congestion, post nasal drip,   CV:  No chest pain,  Orthopnea, PND, swelling in lower extremities, anasarca, dizziness, palpitations, syncope.   GI  No heartburn, indigestion, abdominal pain, nausea, vomiting, diarrhea, change in bowel habits, loss of appetite, bloody stools.   Resp: No shortness of breath with exertion or at rest.  No excess mucus, no productive cough,  No non-productive cough,  No coughing up of blood.  No change in color of mucus.  No wheezing.  No chest wall deformity  Skin: no rash or lesions.  GU: no dysuria, change in color of urine, no urgency or frequency.  No flank pain, no hematuria   MS:  No joint pain or swelling.  No decreased range of motion.  No back pain.    Physical Exam  BP 104/64 (BP Location: Left Arm, Patient Position: Sitting, Cuff Size: Normal)   Pulse 84   Temp 98.7 F (37.1 C) (Oral)   Ht 5\' 11"  (1.803 m)   Wt 197 lb 3.2 oz (89.4 kg)   SpO2 98%   BMI 27.50 kg/m   GEN: A/Ox3; pleasant , NAD, well nourished    HEENT:  [place]/AT,  EACs-clear, TMs-wnl, NOSE-clear, THROAT-clear, no lesions, no postnasal drip or exudate noted.   NECK:  Supple w/ fair ROM; no JVD; normal carotid impulses w/o bruits; no thyromegaly or nodules palpated; no lymphadenopathy.    RESP  Clear  P & A; w/o, wheezes/ rales/ or rhonchi. no accessory muscle use, no dullness to percussion  CARD:  RRR, no m/r/g, no peripheral edema, pulses intact, no cyanosis or clubbing.  GI:   Soft & nt; nml bowel sounds; no organomegaly or masses detected.   Musco: Warm bil, no deformities or joint swelling noted.   Neuro: alert, no focal deficits noted.  Skin: Warm, no lesions or rashes    Lab Results:        Imaging: No results found.    No flowsheet data found.  No results found for: NITRICOXIDE      Assessment & Plan:   OSA  (obstructive sleep apnea) Excellent control and compliance on BiPAP Continue current settings.  BiPAP education given  Plan  Patient Instructions  Change on BIPAP At bedtime. Keep up good work.  Goal is to wear for at least 6 -8 hrs each night.  Do not drive if sleepy.  Work on healthy weight.  Healthy sleep regimen .  Continue follow up with CHF clinic . Keep weight log.  Follow up with Dr. Elsworth Soho  or Ibeth Fahmy in 1 year   and As needed   Please contact office for sooner follow up if symptoms do not improve or worsen or seek emergency care         Obesity Continue on healthy diet  Chronic combined systolic and diastolic heart failure ([place]) Appears euvolemic on exam, continue follow up with Cards     I spent   30 minutes dedicated to the care of this patient on the date of this encounter to include pre-visit review of records, face-to-face time with the patient discussing conditions above, post visit ordering of testing, clinical documentation with the electronic health record, making appropriate referrals as documented, and communicating necessary findings to members of the patients care team.   Rexene Edison, NP 12/11/2020

## 2020-12-11 NOTE — Patient Instructions (Addendum)
Continue on BIPAP At bedtime. Keep up good work.  Goal is to wear for at least 6 -8 hrs each night.  Do not drive if sleepy.  Work on healthy weight.  Healthy sleep regimen .  Continue follow up with CHF clinic . Keep weight log.  Follow up with Dr. Elsworth Soho  or Daaron Dimarco in 1 year   and As needed   Please contact office for sooner follow up if symptoms do not improve or worsen or seek emergency care

## 2020-12-11 NOTE — Assessment & Plan Note (Signed)
Excellent control and compliance on BiPAP Continue current settings.  BiPAP education given  Plan  Patient Instructions  Change on BIPAP At bedtime. Keep up good work.  Goal is to wear for at least 6 -8 hrs each night.  Do not drive if sleepy.  Work on healthy weight.  Healthy sleep regimen .  Continue follow up with CHF clinic . Keep weight log.  Follow up with Dr. Elsworth Soho  or Jaclin Finks in 1 year   and As needed   Please contact office for sooner follow up if symptoms do not improve or worsen or seek emergency care

## 2020-12-11 NOTE — Assessment & Plan Note (Signed)
Appears euvolemic on exam, continue follow up with Cards

## 2020-12-11 NOTE — Assessment & Plan Note (Signed)
Continue on healthy diet

## 2020-12-12 DIAGNOSIS — G4733 Obstructive sleep apnea (adult) (pediatric): Secondary | ICD-10-CM | POA: Diagnosis not present

## 2020-12-19 ENCOUNTER — Other Ambulatory Visit (HOSPITAL_COMMUNITY): Payer: Self-pay | Admitting: Adult Health

## 2020-12-24 ENCOUNTER — Other Ambulatory Visit: Payer: Self-pay

## 2020-12-24 ENCOUNTER — Encounter: Payer: Self-pay | Admitting: Podiatry

## 2020-12-24 ENCOUNTER — Ambulatory Visit: Payer: Medicare HMO | Admitting: Podiatry

## 2020-12-24 VITALS — BP 133/68 | HR 71 | Temp 99.0°F

## 2020-12-24 DIAGNOSIS — M79675 Pain in left toe(s): Secondary | ICD-10-CM | POA: Diagnosis not present

## 2020-12-24 DIAGNOSIS — M2041 Other hammer toe(s) (acquired), right foot: Secondary | ICD-10-CM

## 2020-12-24 DIAGNOSIS — L300 Nummular dermatitis: Secondary | ICD-10-CM | POA: Insufficient documentation

## 2020-12-24 DIAGNOSIS — E119 Type 2 diabetes mellitus without complications: Secondary | ICD-10-CM

## 2020-12-24 DIAGNOSIS — E1142 Type 2 diabetes mellitus with diabetic polyneuropathy: Secondary | ICD-10-CM

## 2020-12-24 DIAGNOSIS — I129 Hypertensive chronic kidney disease with stage 1 through stage 4 chronic kidney disease, or unspecified chronic kidney disease: Secondary | ICD-10-CM | POA: Insufficient documentation

## 2020-12-24 DIAGNOSIS — L2084 Intrinsic (allergic) eczema: Secondary | ICD-10-CM | POA: Insufficient documentation

## 2020-12-24 DIAGNOSIS — M79674 Pain in right toe(s): Secondary | ICD-10-CM | POA: Diagnosis not present

## 2020-12-24 DIAGNOSIS — B351 Tinea unguium: Secondary | ICD-10-CM

## 2020-12-24 DIAGNOSIS — D489 Neoplasm of uncertain behavior, unspecified: Secondary | ICD-10-CM | POA: Insufficient documentation

## 2020-12-24 DIAGNOSIS — M2042 Other hammer toe(s) (acquired), left foot: Secondary | ICD-10-CM

## 2020-12-24 DIAGNOSIS — R21 Rash and other nonspecific skin eruption: Secondary | ICD-10-CM | POA: Insufficient documentation

## 2020-12-24 DIAGNOSIS — N1832 Chronic kidney disease, stage 3b: Secondary | ICD-10-CM | POA: Insufficient documentation

## 2020-12-24 NOTE — Patient Instructions (Signed)
[email or web address]  Hammer Toe Hammer toe is a change in the shape, or a deformity, of the toe. The deformity causes the middle joint of the toe to stay bent. Hammer toe starts gradually. At first, the toe can be straightened. Then over time, the toe deformity becomes stiff, inflexible, and permanently bent. Hammer toe usually affects thesecond, third, or fourth toe. A hammer toe causes pain, especially when wearing shoes. Corns and calluses canresult from the toe rubbing against the inside of the shoe. Early treatments to keep the toe straight may relieve pain. As the deformity of the toe becomes stiff and permanent, surgery may be needed to straighten thetoe. What are the causes? This condition is caused by abnormal bending of the toe joint that is closest to your foot. Over time, the toe bending downward pulls on the muscles and connections (tendons) of the toe joint, making them weak and stiff. Wearing shoes that are too narrow in the toe box and do not allow toes to fully straighten can cause thiscondition. What increases the risk? You are more likely to develop this condition if you: Are an older male. Wear shoes that are too small, or wear high-heeled shoes that pinch your toes. Have a second toe that is longer than your big toe (first toe). Injure your foot or toe. Have arthritis, or have a nerve or muscle disorder. Have diabetes or a condition known as Charcot joint, which may cause you to walk abnormally. Have a family history of hammer toe. Are a Engineer, mining. What are the signs or symptoms? Pain and deformity of the toe are the main symptoms of this condition. The pain is worse when wearing shoes, walking, or running. Other symptoms may include: A thickened patch of skin, called a corn or callus, that forms over the top of the bent part of the toe or between the toes. Redness and a burning feeling on the bent toe. An open sore that  forms on the top of the bent toe. Not being able to straighten the affected toe. How is this diagnosed? This condition is diagnosed based on your symptoms and a physical exam. During the exam, your health care provider will try to straighten your toe to see how stiff the deformity is. You may also have tests, such as: A blood test to check for rheumatoid arthritis or diabetes. An X-ray to show how severe the toe deformity is. How is this treated? Treatment for this condition depends on whether the toe is flexible or deformed and no longer moveable. In less severe cases, a hammer toe can be straightened without surgery. These treatments include: Taping the toe into a straightened position. Using pads and cushions to protect the bent toe. Wearing shoes that provide enough room for the toes. Doing toe-stretching exercises at home. Taking an NSAID, such as ibuprofen, to reduce pain and swelling. Using special orthotics or insoles for pain relief and to improve walking. If these treatments do not help or the toe has a severe deformity and cannot be straightened, surgery is the next option. The most common surgeries used to straighten a hammer toe include: Arthroplasty or osteotomy. Part of the toe joint is reconstructed or removed, which allows the toe to straighten. Fusion. Cartilage between the two bones of the joint is taken out, and the bones are fused together into one longer bone. Implantation. Part of the bone is removed and replaced with an implant to allow the toe to move again. Flexor tendon transfer. The tendons  that curl the toes down (flexor tendons) are repositioned. Follow these instructions at home: Take over-the-counter and prescription medicines only as told by your health care provider. Do toe-straightening and stretching exercises as told by your health care provider. Keep all follow-up visits. This is important. How is this prevented? Wear shoes that fit properly and give your  toes enough room. Shoes should not cause pain. Buy shoes at the end of the day to make sure they fit well, since your foot may swell during the day. Make sure they are comfortable before you buy them. As you age, your shoe size might change, including the width. Measure both feet and buy shoes for the larger foot. A shoe repair store might be able to stretch shoes that feel tight in spots. Do not wear high-heeled shoes or shoes with pointed toes. Contact a health care provider if: Your pain gets worse. Your toe becomes red or swollen. You develop an open sore on your toe. Summary Hammer toe is a condition that gradually causes your toe to become bent and stiff. Hammer toe can be treated by taping the toe into a straightened position and doing toe-stretching exercises. If these treatments do not help, surgery may be needed. To prevent this condition, wear shoes that fit properly, give your toes enough room, and do not cause pain. This information is not intended to replace advice given to you by your health care provider. Make sure you discuss any questions you have with your healthcare provider. Document Revised: 08/30/2019 Document Reviewed: 08/30/2019 Elsevier Patient Education  [place]. Diabetic Neuropathy Diabetic neuropathy refers to nerve damage that is caused by diabetes. Over time, people with diabetes can develop nerve damage throughout the body. There are several types of diabetic neuropathy: Peripheral neuropathy. This is the most common type of diabetic neuropathy. It damages the nerves that carry signals between the spinal cord and other parts of the body (peripheral nerves). This usually affects nerves in the feet, legs, hands, and arms. Autonomic neuropathy. This type causes damage to nerves that control involuntary functions (autonomic nerves). Involuntary functions are functions of the body that you do not control. They include heartbeat, body temperature, blood  pressure, urination, digestion, sweating, sexual function, or response to changes in blood glucose. Focal neuropathy. This type of nerve damage affects one area of the body, such as an arm, a leg, or the face. The injury may involve one nerve or a small group of nerves. Focal neuropathy can be painful and unpredictable. It occurs most often in older adults with diabetes. This often develops suddenly, but usually improves over time and does not cause long-term problems. Proximal neuropathy. This type of nerve damage affects the nerves of the thighs, hips, buttocks, or legs. It causes severe pain, weakness, and muscle death (atrophy), usually in the thigh muscles. It is more common among older men and people who have type 2 diabetes. The length of recovery time may vary. What are the causes? Peripheral, autonomic, and focal neuropathies are caused by diabetes that is not well controlled with treatment. The cause of proximal neuropathy is not known, but it may be caused by inflammation related to uncontrolled bloodglucose levels. What are the signs or symptoms? Peripheral neuropathy Peripheral neuropathy develops slowly over time. When the nerves of the feet and legs no longer work, you may experience: Burning, stabbing, or aching pain in the legs or feet. Pain or cramping in the legs or feet. Loss of feeling (numbness) and inability to  feel pressure or pain in the feet. This can lead to: Thick calluses or sores on areas of constant pressure. Ulcers. Reduced ability to feel temperature changes. Foot deformities. Muscle weakness. Loss of balance or coordination. Autonomic neuropathy The symptoms of autonomic neuropathy vary depending on which nerves are affected. Symptoms may include: Problems with digestion, such as: Nausea or vomiting. Poor appetite. Bloating. Diarrhea or constipation. Trouble swallowing. Losing weight without trying to. Problems with the heart, blood, and lungs, such  as: Dizziness, especially when standing up. Fainting. Shortness of breath. Irregular heartbeat. Bladder problems, such as: Trouble starting or stopping urination. Leaking urine. Trouble emptying the bladder. Urinary tract infections (UTIs). Problems with other body functions, such as: Sweat. You may sweat too much or too little. Temperature. You might get hot easily. Or, you might feel cold more than usual. Sexual function. Men may not be able to get or maintain an erection. Women may have vaginal dryness and difficulty with arousal. Focal neuropathy Symptoms affect only one area of the body. Common symptoms include: Numbness. Tingling. Burning pain. Prickling feeling. Very sensitive skin. Weakness. Inability to move (paralysis). Muscle twitching. Muscles getting smaller (wasting). Poor coordination. Double or blurred vision. Proximal neuropathy Sudden, severe pain in the hip, thigh, or buttocks. Pain may spread from the back into the legs (sciatica). Pain and numbness in the arms and legs. Tingling. Loss of bladder control or bowel control. Weakness and wasting of thigh muscles. Difficulty getting up from a seated position. Abdominal swelling. Unexplained weight loss. How is this diagnosed? Diagnosis varies depending on the type of neuropathy your health care providersuspects. Peripheral neuropathy Your health care provider will do a neurologic exam. This exam checks your reflexes, how you move, and what you can feel. You may have other tests, such as: Blood tests. Tests of the fluid that surrounds the spinal cord (lumbar puncture). CT scan. MRI. Checking the nerves that control muscles (electromyogram, or EMG). Checking how quickly signals pass through your nerves (nerve conduction study). Checking a small piece of a nerve using a microscope (biopsy). Autonomic neuropathy You may have tests, such as: Tests to measure your blood pressure and heart rate. You may be  secured to an exam table that moves you from a lying position to an upright position (table tilt test). Breathing tests to check your lungs. Tests to check how food moves through the digestive system (gastric emptying tests). Blood, sweat, or urine tests. Ultrasound of your bladder. Spinal fluid tests. Focal neuropathy This condition may be diagnosed with: A neurologic exam. CT scan. MRI. EMG. Nerve conduction study. Proximal neuropathy There is no test to diagnose this type of neuropathy. You may have tests to rule out other possible causes of this type of neuropathy. Tests may include: X-rays of your spine and lumbar region. Lumbar puncture. MRI. How is this treated? The goal of treatment is to keep nerve damage from getting worse. Treatment may include: Following your diabetes management plan. This will help keep your blood glucose level and your A1C level within your target range. This is the most important treatment. Using prescription pain medicine. Follow these instructions at home: Diabetes management Follow your diabetes management plan as told by your health care provider. Check your blood glucose levels. Keep your blood glucose in your target range. Have your A1C level checked at least two times a year, or as often as told. Take over the counter and prescription medicines only as told by your health care provider. This includes insulin and  diabetes medicine.  Lifestyle  Do not use any products that contain nicotine or tobacco, such as cigarettes, e-cigarettes, and chewing tobacco. If you need help quitting, ask your health care provider. Be physically active every day. Include strength training and balance exercises. Follow a healthy meal plan. Work with your health care provider to manage your blood pressure.  General instructions Ask your health care provider if the medicine prescribed to you requires you to avoid driving or using machinery. Check your skin and  feet every day for cuts, bruises, redness, blisters, or sores. Keep all follow-up visits. This is important. Contact a health care provider if: You have burning, stabbing, or aching pain in your legs or feet. You are unable to feel pressure or pain in your feet. You develop problems with digestion, such as: Nausea. Vomiting. Bloating. Constipation. Diarrhea. Abdominal pain. You have difficulty with urination, such as: Inability to control when you urinate (incontinence). Inability to completely empty the bladder (retention). You feel as if your heart is racing (palpitations). You feel dizzy, weak, or faint when you stand up. Get help right away if: You cannot urinate. You have sudden weakness or loss of coordination. You have trouble speaking. You have pain or pressure in your chest. You have an irregular heartbeat. You have sudden inability to move a part of your body. These symptoms may represent a serious problem that is an emergency. Do not wait to see if the symptoms will go away. Get medical help right away. Call your local emergency services (911 in the [place] Do not drive yourself to the hospital. Summary Diabetic neuropathy is nerve damage that is caused by diabetes. It can cause numbness and pain in the arms, legs, digestive tract, heart, and other body systems. This condition is treated by keeping your blood glucose level and your A1C level within your target range. This can help prevent neuropathy from getting worse. Check your skin and feet every day for cuts, bruises, redness, blisters, or sores. Do not use any products that contain nicotine or tobacco, such as cigarettes, e-cigarettes, and chewing tobacco. This information is not intended to replace advice given to you by your health care provider. Make sure you discuss any questions you have with your healthcare provider. Document Revised: 10/04/2019 Document Reviewed: 10/04/2019 Elsevier Patient Education  2022  [place]. Diabetes Mellitus and [place] care is an important part of your health, especially when you have diabetes. Diabetes may cause you to have problems because of poor blood flow (circulation) to your feet and legs, which can cause your skin to: Become thinner and drier. Break more easily. Heal more slowly. Peel and crack. You may also have nerve damage (neuropathy) in your legs and feet, causing decreased feeling in them. This means that you may not notice minor injuries to your feet that could lead to more serious problems. Noticing and addressing any potential problems early is the best wayto prevent future foot problems. How to care for your feet Foot hygiene  Wash your feet daily with warm water and mild soap. Do not use hot water. Then, pat your feet and the areas between your toes until they are completely dry. Do not soak your feet as this can dry your skin. Trim your toenails straight across. Do not dig under them or around the cuticle. File the edges of your nails with an emery board or nail file. Apply a moisturizing lotion or petroleum jelly to the skin on your feet and to dry,  brittle toenails. Use lotion that does not contain alcohol and is unscented. Do not apply lotion between your toes.  Shoes and socks Wear clean socks or stockings every day. Make sure they are not too tight. Do not wear knee-high stockings since they may decrease blood flow to your legs. Wear shoes that fit properly and have enough cushioning. Always look in your shoes before you put them on to be sure there are no objects inside. To break in new shoes, wear them for just a few hours a day. This prevents injuries on your feet. Wounds, scrapes, corns, and calluses  Check your feet daily for blisters, cuts, bruises, sores, and redness. If you cannot see the bottom of your feet, use a mirror or ask someone for help. Do not cut corns or calluses or try to remove them with medicine. If you find a  minor scrape, cut, or break in the skin on your feet, keep it and the skin around it clean and dry. You may clean these areas with mild soap and water. Do not clean the area with peroxide, alcohol, or iodine. If you have a wound, scrape, corn, or callus on your foot, look at it several times a day to make sure it is healing and not infected. Check for: Redness, swelling, or pain. Fluid or blood. Warmth. Pus or a bad smell.  General tips Do not cross your legs. This may decrease blood flow to your feet. Do not use heating pads or hot water bottles on your feet. They may burn your skin. If you have lost feeling in your feet or legs, you may not know this is happening until it is too late. Protect your feet from hot and cold by wearing shoes, such as at the beach or on hot pavement. Schedule a complete foot exam at least once a year (annually) or more often if you have foot problems. Report any cuts, sores, or bruises to your health care provider immediately. Where to find more information American Diabetes Association: www.diabetes.org Association of Diabetes Care & Education Specialists: www.diabeteseducator.org Contact a health care provider if: You have a medical condition that increases your risk of infection and you have any cuts, sores, or bruises on your feet. You have an injury that is not healing. You have redness on your legs or feet. You feel burning or tingling in your legs or feet. You have pain or cramps in your legs and feet. Your legs or feet are numb. Your feet always feel cold. You have pain around any toenails. Get help right away if: You have a wound, scrape, corn, or callus on your foot and: You have pain, swelling, or redness that gets worse. You have fluid or blood coming from the wound, scrape, corn, or callus. Your wound, scrape, corn, or callus feels warm to the touch. You have pus or a bad smell coming from the wound, scrape, corn, or callus. You have a  fever. You have a red line going up your leg. Summary Check your feet every day for blisters, cuts, bruises, sores, and redness. Apply a moisturizing lotion or petroleum jelly to the skin on your feet and to dry, brittle toenails. Wear shoes that fit properly and have enough cushioning. If you have foot problems, report any cuts, sores, or bruises to your health care provider immediately. Schedule a complete foot exam at least once a year (annually) or more often if you have foot problems. This information is not intended to replace advice  given to you by your health care provider. Make sure you discuss any questions you have with your healthcare provider. Document Revised: 12/13/2019 Document Reviewed: 12/13/2019 Elsevier Patient Education  [place].

## 2020-12-27 NOTE — Progress Notes (Signed)
Subjective: [name] presents today referred by [name], MD for diabetic foot evaluation.  Patient relates 15 year history of diabetes.  Patient denies any history of foot wounds.  He does have diagnosed neuropathy and takes gabapentin 100 mg twice daily.  Patient relates blood glucose was 144 mg/dl this morning.   He has h/o hammertoe surgery of both 5th digits about 45 years ago.  PCP is [name], MD , and last visit was 09/11/2020 .  Today, patient c/o of painful, discolored, thick toenails which interfere with daily activities.  Pain is aggravated when wearing enclosed shoe gear.   Past Medical History:  Diagnosis Date   AORTIC STENOSIS, MODERATE 12/05/2009   Atrial fibrillation (HCC)    Atrial fibrillation with RVR ([place]) 07/05/2017   Atrial flutter ([place]) 12/05/2009   Atypical chest pain 11/08/2011   BENIGN PROSTATIC HYPERTROPHY, HX OF 12/05/2009   CHEST PAIN-UNSPECIFIED 11/11/2009   CHF 12/05/2009   Chronic renal insufficiency 12/16/2016   Debility 04/18/2012   Diarrhea 12/16/2016   DYSPNEA 12/19/2009   Edema 04/18/2012   ERECTILE DYSFUNCTION 01/25/2007   FATIGUE, CHRONIC 11/11/2009   Gout 07/10/2012   Heart murmur    History of kidney stones    "they passed" (07/05/2017)   Hyperkalemia 04/18/2012   Hyperlipidemia    Hypertension    HYPERTENSION 01/25/2007   Hypoxia 05/05/2011   Insomnia 05/13/2016   INSOMNIA, HX OF 01/25/2007   Knee pain, bilateral 12/28/2010   Knee pain, right 12/28/2010   NEUROMA 01/05/2010   OBESITY NOS 01/25/2007   OSA on CPAP 04/06/2010   PERIPHERAL NEUROPATHY 01/05/2010   PULMONARY FUNCTION TESTS, ABNORMAL 02/02/2010   Superficial thrombophlebitis of left leg 09/07/2011   TESTICULAR HYPOFUNCTION 01/05/2010   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   Toe pain, right 07/10/2012   Type II diabetes mellitus ([place]) 12/05/2009   UNSPECIFIED ANEMIA 01/05/2010    Patient Active Problem List   Diagnosis Date Noted   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   Benign hypertensive  kidney disease with chronic kidney disease 12/24/2020   Neoplasm of uncertain behavior 45/85/9292   Rash 12/24/2020   Stage 3b chronic kidney disease ([place]) 12/24/2020   Chronic thumb pain, left 09/14/2020   Secondary hyperparathyroidism of renal origin ([place]) 01/02/2020   Secondary hypercoagulable state ([place]) 06/18/2019   Tremor 06/13/2019   Respiratory failure ([place])    Persistent atrial fibrillation ([place])    Chronic viral hepatitis B ([place]) 02/22/2019   Atrial fibrillation ([place]) 07/05/2017   Diarrhea 12/16/2016   Chronic renal insufficiency 12/16/2016   Encounter for monitoring dofetilide therapy 11/10/2016   Insomnia 05/13/2016   Decreased visual acuity 02/29/2016   Pigmented skin lesions 01/10/2015   Dermatitis 09/23/2014   OSA (obstructive sleep apnea) 07/13/2014   Medicare annual wellness visit, subsequent 06/23/2014   Anxiety state, unspecified 08/17/2013   Nonrheumatic aortic valve insufficiency 02/18/2013   Long term (current) use of anticoagulants 08/22/2012   Gout 07/10/2012   Hyperkalemia 04/18/2012   Edema 04/18/2012   Debility 04/18/2012   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   Superficial thrombophlebitis of left leg 09/07/2011   PAF (paroxysmal atrial fibrillation) ([place]) 08/02/2011   Hypoxia 05/05/2011   Knee pain, bilateral 12/28/2010   Hyperlipidemia 10/13/2010   NEUROMA 01/05/2010   TESTICULAR HYPOFUNCTION 01/05/2010   Anemia 01/05/2010   Hereditary and idiopathic peripheral neuropathy 01/05/2010   Diabetes mellitus type 2 in obese ( [place]) 12/05/2009   Chronic combined systolic and diastolic heart failure ([place]) 12/05/2009   BPH (benign prostatic hyperplasia)  12/05/2009   Fatigue 11/11/2009   CHEST PAIN-UNSPECIFIED 11/11/2009   Obesity 01/25/2007   ERECTILE DYSFUNCTION 01/25/2007   Essential hypertension 01/25/2007   History of other specified conditions presenting hazards to health 01/25/2007    Past Surgical History:  Procedure Laterality Date   A-FLUTTER  ABLATION N/A 05/16/2019   Procedure: A-FLUTTER ABLATION;  Surgeon: Constance Haw, MD;  Location: [facility];  Service: Cardiovascular;  Laterality: N/A;   ATRIAL FIBRILLATION ABLATION N/A 05/16/2019   Procedure: ATRIAL FIBRILLATION ABLATION;  Surgeon: Constance Haw, MD;  Location: [facility];  Service: Cardiovascular;  Laterality: N/A;   ATRIAL FIBRILLATION ABLATION N/A 03/10/2020   Procedure: ATRIAL FIBRILLATION ABLATION;  Surgeon: Constance Haw, MD;  Location: [facility];  Service: Cardiovascular;  Laterality: N/A;   CARDIAC CATHETERIZATION  10/16/2009   nonischemic cardiomyopathy   CARDIOVERSION N/A 03/25/2015   Procedure: CARDIOVERSION;  Surgeon: Pixie Casino, MD;  Location: [facility];  Service: Cardiovascular;  Laterality: N/A;   CARDIOVERSION N/A 06/17/2017   Procedure: CARDIOVERSION;  Surgeon: Sanda Klein, MD;  Location: [facility];  Service: Cardiovascular;  Laterality: N/A;   CARDIOVERSION N/A 07/08/2017   Procedure: CARDIOVERSION;  Surgeon: Fay Records, MD;  Location[facility];  Service: Cardiovascular;  Laterality: N/A;   CARDIOVERSION N/A 02/27/2019   Procedure: CARDIOVERSION;  Surgeon: Sanda Klein, MD;  Location: [facility];  Service: Cardiovascular;  Laterality: N/A;   CARDIOVERSION N/A 08/16/2019   Procedure: CARDIOVERSION;  Surgeon: Sanda Klein, MD;  Location: [facility];  Service: Cardiovascular;  Laterality: N/A;   METATARSAL OSTEOTOMY Bilateral ~ 1980   removed part of 5th metatarsal to corect curvature of toes    RIGHT HEART CATH N/A 05/16/2019   Procedure: RIGHT HEART CATH;  Surgeon: Jolaine Artist, MD;  Location: [facility];  Service: Cardiovascular;  Laterality: N/A;   TEE WITHOUT CARDIOVERSION  05/16/2019   Procedure: Transesophageal Echocardiogram ([place]);  Surgeon: Constance Haw, MD;  Location: [facility];  Service: Cardiovascular;;   TEE WITHOUT CARDIOVERSION N/A 03/07/2020    Procedure: TRANSESOPHAGEAL ECHOCARDIOGRAM (TEE);  Surgeon: Freada Bergeron, MD;  Location: [facility]COPY;  Service: Cardiovascular;  Laterality: N/A;    Current Outpatient Medications on File Prior to Visit  Medication Sig Dispense Refill   clobetasol ointment (TEMOVATE) 1.28 % 1 application to affected area     white petrolatum (VASELINE) GEL See admin instructions.     Accu-Chek Softclix Lancets lancets Use as instructed 200 each 3   albuterol (VENTOLIN HFA) 108 (90 Base) MCG/ACT inhaler Inhale 2 puffs into the lungs every 6 (six) hours as needed for wheezing or shortness of breath. 1 Inhaler 6   Alcohol Swabs (B-D SINGLE USE SWABS REGULAR) PADS USE AS DIRECTED 200 each 1   allopurinol (ZYLOPRIM) 300 MG tablet Take 1 tablet by mouth once daily 90 tablet 1   apixaban (ELIQUIS) 5 MG TABS tablet Take 1 tablet by mouth twice daily 180 tablet 1   Blood Glucose Monitoring Suppl (ACCU-CHEK GUIDE ME) w/Device KIT Use as instructed to check blood sugar 1-2 times daily. 1 kit 0   calcium carbonate (TUMS - DOSED IN MG ELEMENTAL CALCIUM) 500 MG chewable tablet Chew 500 mg by mouth daily as needed for indigestion or heartburn.     cetirizine (ZYRTEC) 10 MG tablet Take 1 tablet (10 mg total) by mouth daily. 30 tablet 11   Clobetasol Prop Emollient Base (CLOBETASOL PROPIONATE E) 0.05 % emollient cream Apply  1 application topically 2 (two) times daily as needed (insect bite). 60 g 1   colchicine (COLCRYS) 0.6 MG tablet Take one tablet for 4-6 days as needed for gout flare up. 15 tablet 3   dapagliflozin propanediol (FARXIGA) 10 MG TABS tablet TAKE 1 TABLET BY MOUTH ONCE DAILY 90 tablet 3   fish oil-omega-3 fatty acids 1000 MG capsule Take 1 g by mouth daily.     furosemide (LASIX) 80 MG tablet Take 1 tablet (80 mg total) by mouth daily. As needed patient may take an additional 40 MG tablet as direct per [employer] study PRN plan. 95 tablet 3   gabapentin (NEURONTIN) 100 MG capsule Take 2 capsules  (200 mg total) by mouth at bedtime. 180 capsule 3   glucose blood (ACCU-CHEK GUIDE) test strip Use as instructed to check blood sugar 1-2 times daily 200 each 3   glyBURIDE (DIABETA) 5 MG tablet Take 1/2 tablet by mouth before dinner 90 tablet 1   hydrALAZINE (APRESOLINE) 25 MG tablet Take 1 tablet (25 mg total) by mouth 3 (three) times daily. 270 tablet 2   HYDROcodone-acetaminophen (NORCO) 10-325 MG tablet Take 1 tablet by mouth every 8 (eight) hours as needed for moderate pain or severe pain. 90 tablet 0   hydrocortisone 2.5 % ointment Apply topically 2 (two) times daily as needed. 30 g 1   isosorbide mononitrate (IMDUR) 30 MG 24 hr tablet Take 1 tablet (30 mg total) by mouth daily. Last refill without office visit please call [phone] to schedule followup 30 tablet 0   LORazepam (ATIVAN) 1 MG tablet TAKE 1 TABLET BY MOUTH EVERY 8 HOURS AS NEEDED FOR ANXIETY 30 tablet 2   magnesium oxide (MAG-OX) 400 MG tablet Take 1 tablet (400 mg total) by mouth 2 (two) times daily. 30 tablet 11   metoprolol succinate (TOPROL-XL) 50 MG 24 hr tablet Take 1 tablet (50 mg total) by mouth 2 (two) times daily. Take with or immediately following a meal. 180 tablet 3   Multiple Vitamin (MULTIVITAMIN WITH MINERALS) TABS tablet Take 1 tablet by mouth daily.      naproxen (NAPROSYN) 375 MG tablet Take 375 mg by mouth 2 (two) times daily as needed (knee pain.).      Polyethyl Glycol-Propyl Glycol (SYSTANE OP) Place 1 drop into both eyes daily as needed (dry eyes).     potassium chloride SA (KLOR-CON M20) 20 MEQ tablet Take 1 tablet (20 mEq total) by mouth as directed. 1 tablet daily 20 mEq by mouth as directed per [employer] research protocol. 90 tablet 3   pravastatin (PRAVACHOL) 40 MG tablet Take 1 tablet by mouth once daily 90 tablet 3   tamsulosin (FLOMAX) 0.4 MG CAPS capsule Take 1 capsule by mouth once daily 30 capsule 0   temazepam (RESTORIL) 30 MG capsule Take 1 capsule by mouth at bedtime 30 capsule 0   No  current facility-administered medications on file prior to visit.     Allergies  Allergen Reactions   Sulfonamide Derivatives Other (See Comments)    Flu like symptoms    Zolpidem Other (See Comments)    Excessive, prolonged sedation    Social History   Occupational History   Occupation: retired    Fish farm manager: [employer]  Tobacco Use   Smoking status: Former    Packs/day: 1.00    Years: 10.00    Pack years: 10.00    Types: Cigarettes    Quit date: 06/07/1980    Years since quitting: 28.5  Smokeless tobacco: Never  Vaping Use   Vaping Use: Never used  Substance and Sexual Activity   Alcohol use: No   Drug use: No   Sexual activity: Not Currently    Family History  Problem Relation Age of Onset   Clotting disorder Mother    Heart disease Mother        s/p MI   Heart attack Mother    Hypertension Mother    Diabetes Mother    Hyperlipidemia Mother    Stroke Mother    Cancer Father        ? lung   Lung disease Father        smoker   Diabetes Sister    Hypertension Sister        smoker   Leukemia Maternal Grandmother        ?   Aneurysm Sister        brain   Other Sister        clipped   Seizures Sister        d/o w/aneurysm/ smoker    Immunization History  Administered Date(s) Administered   Fluad Quad(high Dose 65+) 05/22/2020   Influenza Split 03/03/2011, 03/10/2012, 04/07/2018   Influenza Whole 03/28/2008, 03/30/2010   Influenza,inj,Quad PF,6+ Mos 01/31/2013, 03/07/2014, 04/14/2015, 02/19/2016, 03/18/2017   Influenza-Unspecified 04/05/2019   PFIZER(Purple Top)SARS-COV-2 Vaccination 08/03/2019, 08/21/2019, 04/18/2020, 09/18/2020   Pneumococcal Polysaccharide-23 10/07/2009   Td 09/05/2009    Objective: Vitals:   12/24/20 1405  BP: 133/68  Pulse: 71  Temp: 99 F (37.2 C)  SpO2: 98%    [name] is a pleasant 70 y.o. male WD, WN in NAD. AAO X 3.  Vascular Examination: Capillary refill time to digits immediate b/l. Palpable pedal pulses  b/l LE. Pedal hair sparse. Lower extremity skin temperature gradient within normal limits. No pain with calf compression b/l. No edema noted b/l lower extremities.  Dermatological Examination: Pedal skin with normal turgor, texture and tone b/l lower extremities. No open wounds b/l lower extremities. No interdigital macerations b/l lower extremities. Toenails 1-5 b/l elongated, discolored, dystrophic, thickened, crumbly with subungual debris and tenderness to dorsal palpation.  Musculoskeletal Examination: Normal muscle strength 5/5 to all lower extremity muscle groups bilaterally. No pain crepitus or joint limitation noted with ROM b/l. Hammertoe(s) noted to the lesser digits b/l.  Footwear Assessment: Does the patient wear appropriate shoes? Yes. Does the patient need inserts/orthotics? No.  Neurological Examination: Protective sensation intact 5/5 intact bilaterally with 10g monofilament b/l. Vibratory sensation intact b/l. Clonus negative b/l.  Lab: Hemoglobin A1C Latest Ref Rng & Units 10/21/2020 07/22/2020 04/18/2020  HGBA1C 4.0 - 5.6 % 6.9(A) 6.3(A) 8.1(A)  Some recent data might be hidden   Assessment: 1. Pain due to onychomycosis of toenails of both feet   2. Acquired hammertoes of both feet   3. Diabetic peripheral neuropathy associated with type 2 diabetes mellitus ([place])   4. Encounter for diabetic foot exam ([place])     ADA Risk Categorization:  Low Risk:  Patient has all of the following: Intact protective sensation No prior foot ulcer  No severe deformity Pedal pulses present  Plan: -Examined patient. -Diabetic foot examination performed today. -Patient to continue soft, supportive shoe gear daily. -Discussed topical, laser and oral medication for onychomycosis. Patient opted for toenail debridement only on today.  -Toenails 1-5 b/l were debrided in length and girth with sterile nail nippers and dremel without iatrogenic bleeding.  -Patient to report any pedal  injuries to medical  professional immediately. -Patient/POA to call should there be question/concern in the interim.  Return in about 3 months (around 03/26/2021).  Marzetta Board, DPM

## 2021-01-05 ENCOUNTER — Encounter: Payer: Self-pay | Admitting: Cardiology

## 2021-01-05 ENCOUNTER — Other Ambulatory Visit: Payer: Self-pay

## 2021-01-05 ENCOUNTER — Ambulatory Visit (INDEPENDENT_AMBULATORY_CARE_PROVIDER_SITE_OTHER): Payer: Medicare HMO | Admitting: Cardiology

## 2021-01-05 VITALS — BP 136/64 | HR 84 | Ht 71.0 in | Wt 193.0 lb

## 2021-01-05 DIAGNOSIS — I4819 Other persistent atrial fibrillation: Secondary | ICD-10-CM

## 2021-01-05 NOTE — Patient Instructions (Addendum)
Medication Instructions:  Your physician recommends that you continue on your current medications as directed. Please refer to the Current Medication list given to you today.  *If you need a refill on your cardiac medications before your next appointment, please call your pharmacy*   Lab Work: None ordered If you have labs (blood work) drawn today and your tests are completely normal, you will receive your results only by: [place] (if you have MyChart) OR A paper copy in the mail If you have any lab test that is abnormal or we need to change your treatment, we will call you to review the results.   Testing/Procedures: None ordered   Follow-Up: At [facility], you and your health needs are our priority.  As part of our continuing mission to provide you with exceptional heart care, we have created designated Provider Care Teams.  These Care Teams include your primary Cardiologist (physician) and Advanced Practice Providers (APPs -  Physician Assistants and Nurse Practitioners) who all work together to provide you with the care you need, when you need it.  Your next appointment:   6 month(s)  The format for your next appointment:   In Person  Provider:   Allegra Lai, MD    Thank you for choosing [facility]!!   Trinidad Curet, RN [phone] [employer] ([employer]):  [phone]     Wilder Glade  [phone]

## 2021-01-05 NOTE — Progress Notes (Signed)
Electrophysiology Office Note   Date:  01/05/2021   ID:  [name], [name] 1951/04/01, MRN [id number]  PCP:  [name], MD  Cardiologist:  [facility] Primary Electrophysiologist:  Calypso Hagarty Meredith [name], MD    No chief complaint on file.    History of Present Illness: [name] is a 70 y.o. male who is being seen today for the evaluation of atrial fibrillation at the request of Mihai Croitrou. Presenting today for electrophysiology evaluation.    He has a history significant for atrial fibrillation, atrial flutter, aortic insufficiency, hyperlipidemia, OSA, hypertension, type 2 diabetes, diastolic heart failure.  He had a cardioversion at 02/27/2019.  He was in sinus rhythm for short period of time.  He developed NYHA class II-III exertional fatigue.  He had been compliant with his CPAP.  ECG showed atrial flutter.  He is status post ablation 05/16/2019.  He was in acute heart failure at the time of ablation and required prolonged hospital stay postop.  He ultimately went back into atrial fibrillation with repeat ablation 03/10/2020.  During that procedure he was found his pulmonary veins and posterior wall remained isolated.  He had ablation of the anterior wall with his left atrium.  Today, denies symptoms of palpitations, chest pain, shortness of breath, orthopnea, PND, lower extremity edema, claudication, dizziness, presyncope, syncope, bleeding, or neurologic sequela. The patient is tolerating medications without difficulties.  Since being seen he has done well.  He has no chest pain or shortness of breath.  Is able to all of his daily activities.  He states that he feels better today than he has quite a while.  He has fortunately gone back into normal rhythm and has been in normal rhythm for quite some time now.  Past Medical History:  Diagnosis Date   AORTIC STENOSIS, MODERATE 12/05/2009   Atrial fibrillation (HCC)    Atrial fibrillation with RVR ([place]) 07/05/2017   Atrial  flutter ([place]) 12/05/2009   Atypical chest pain 11/08/2011   BENIGN PROSTATIC HYPERTROPHY, HX OF 12/05/2009   CHEST PAIN-UNSPECIFIED 11/11/2009   CHF 12/05/2009   Chronic renal insufficiency 12/16/2016   Debility 04/18/2012   Diarrhea 12/16/2016   DYSPNEA 12/19/2009   Edema 04/18/2012   ERECTILE DYSFUNCTION 01/25/2007   FATIGUE, CHRONIC 11/11/2009   Gout 07/10/2012   Heart murmur    History of kidney stones    "they passed" (07/05/2017)   Hyperkalemia 04/18/2012   Hyperlipidemia    Hypertension    HYPERTENSION 01/25/2007   Hypoxia 05/05/2011   Insomnia 05/13/2016   INSOMNIA, HX OF 01/25/2007   Knee pain, bilateral 12/28/2010   Knee pain, right 12/28/2010   NEUROMA 01/05/2010   OBESITY NOS 01/25/2007   OSA on CPAP 04/06/2010   PERIPHERAL NEUROPATHY 01/05/2010   PULMONARY FUNCTION TESTS, ABNORMAL 02/02/2010   Superficial thrombophlebitis of left leg 09/07/2011   TESTICULAR HYPOFUNCTION 01/05/2010   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   Toe pain, right 07/10/2012   Type II diabetes mellitus ([place]) 12/05/2009   UNSPECIFIED ANEMIA 01/05/2010   Past Surgical History:  Procedure Laterality Date   A-FLUTTER ABLATION N/A 05/16/2019   Procedure: A-FLUTTER ABLATION;  Surgeon: Constance Haw, MD;  Location: [facility];  Service: Cardiovascular;  Laterality: N/A;   ATRIAL FIBRILLATION ABLATION N/A 05/16/2019   Procedure: ATRIAL FIBRILLATION ABLATION;  Surgeon: Constance Haw, MD;  Location: [facility];  Service: Cardiovascular;  Laterality: N/A;   ATRIAL FIBRILLATION ABLATION N/A 03/10/2020   Procedure: ATRIAL FIBRILLATION ABLATION;  Surgeon: Constance Haw, MD;  Location: [facility];  Service: Cardiovascular;  Laterality: N/A;   CARDIAC CATHETERIZATION  10/16/2009   nonischemic cardiomyopathy   CARDIOVERSION N/A 03/25/2015   Procedure: CARDIOVERSION;  Surgeon: Pixie Casino, MD;  Location: [facility];  Service: Cardiovascular;  Laterality: N/A;   CARDIOVERSION N/A 06/17/2017   Procedure:  CARDIOVERSION;  Surgeon: Sanda Klein, MD;  Location: [facility];  Service: Cardiovascular;  Laterality: N/A;   CARDIOVERSION N/A 07/08/2017   Procedure: CARDIOVERSION;  Surgeon: Fay Records, MD;  Location: [facility];  Service: Cardiovascular;  Laterality: N/A;   CARDIOVERSION N/A 02/27/2019   Procedure: CARDIOVERSION;  Surgeon: Sanda Klein, MD;  Location: [facility];  Service: Cardiovascular;  Laterality: N/A;   CARDIOVERSION N/A 08/16/2019   Procedure: CARDIOVERSION;  Surgeon: Sanda Klein, MD;  Location: [facility];  Service: Cardiovascular;  Laterality: N/A;   METATARSAL OSTEOTOMY Bilateral ~ 1980   removed part of 5th metatarsal to corect curvature of toes    RIGHT HEART CATH N/A 05/16/2019   Procedure: RIGHT HEART CATH;  Surgeon: Jolaine Artist, MD;  Location: [facility];  Service: Cardiovascular;  Laterality: N/A;   TEE WITHOUT CARDIOVERSION  05/16/2019   Procedure: Transesophageal Echocardiogram (Tee);  Surgeon: Constance Haw, MD;  Location: [facility];  Service: Cardiovascular;;   TEE WITHOUT CARDIOVERSION N/A 03/07/2020   Procedure: TRANSESOPHAGEAL ECHOCARDIOGRAM (TEE);  Surgeon: Freada Bergeron, MD;  Location[facility];  Service: Cardiovascular;  Laterality: N/A;     Current Outpatient Medications  Medication Sig Dispense Refill   Accu-Chek Softclix Lancets lancets Use as instructed 200 each 3   albuterol (VENTOLIN HFA) 108 (90 Base) MCG/ACT inhaler Inhale 2 puffs into the lungs every 6 (six) hours as needed for wheezing or shortness of breath. 1 Inhaler 6   Alcohol Swabs (B-D SINGLE USE SWABS REGULAR) PADS USE AS DIRECTED 200 each 1   allopurinol (ZYLOPRIM) 300 MG tablet Take 1 tablet by mouth once daily 90 tablet 1   apixaban (ELIQUIS) 5 MG TABS tablet Take 1 tablet by mouth twice daily 180 tablet 1   Blood Glucose Monitoring Suppl (ACCU-CHEK GUIDE ME) w/Device KIT Use as instructed to check blood sugar 1-2 times daily. 1 kit 0    calcium carbonate (TUMS - DOSED IN MG ELEMENTAL CALCIUM) 500 MG chewable tablet Chew 500 mg by mouth daily as needed for indigestion or heartburn.     cetirizine (ZYRTEC) 10 MG tablet Take 1 tablet (10 mg total) by mouth daily. 30 tablet 11   clobetasol ointment (TEMOVATE) 8.34 % 1 application to affected area     Clobetasol Prop Emollient Base (CLOBETASOL PROPIONATE E) 0.05 % emollient cream Apply 1 application topically 2 (two) times daily as needed (insect bite). 60 g 1   colchicine (COLCRYS) 0.6 MG tablet Take one tablet for 4-6 days as needed for gout flare up. 15 tablet 3   dapagliflozin propanediol (FARXIGA) 10 MG TABS tablet TAKE 1 TABLET BY MOUTH ONCE DAILY 90 tablet 3   fish oil-omega-3 fatty acids 1000 MG capsule Take 1 g by mouth daily.     furosemide (LASIX) 80 MG tablet Take 1 tablet (80 mg total) by mouth daily. As needed patient may take an additional 40 MG tablet as direct per [employer] study PRN plan. 95 tablet 3   gabapentin (NEURONTIN) 100 MG capsule Take 2 capsules (200 mg total) by mouth at bedtime. 180 capsule 3   glucose blood (ACCU-CHEK GUIDE)  test strip Use as instructed to check blood sugar 1-2 times daily 200 each 3   glyBURIDE (DIABETA) 5 MG tablet Take 1/2 tablet by mouth before dinner 90 tablet 1   hydrALAZINE (APRESOLINE) 25 MG tablet Take 1 tablet (25 mg total) by mouth 3 (three) times daily. 270 tablet 2   HYDROcodone-acetaminophen (NORCO) 10-325 MG tablet Take 1 tablet by mouth every 8 (eight) hours as needed for moderate pain or severe pain. 90 tablet 0   hydrocortisone 2.5 % ointment Apply topically 2 (two) times daily as needed. 30 g 1   isosorbide mononitrate (IMDUR) 30 MG 24 hr tablet Take 1 tablet (30 mg total) by mouth daily. Last refill without office visit please call [phone] to schedule followup 30 tablet 0   LORazepam (ATIVAN) 1 MG tablet TAKE 1 TABLET BY MOUTH EVERY 8 HOURS AS NEEDED FOR ANXIETY 30 tablet 2   magnesium oxide (MAG-OX) 400  MG tablet Take 1 tablet (400 mg total) by mouth 2 (two) times daily. 30 tablet 11   metoprolol succinate (TOPROL-XL) 50 MG 24 hr tablet Take 1 tablet (50 mg total) by mouth 2 (two) times daily. Take with or immediately following a meal. 180 tablet 3   Multiple Vitamin (MULTIVITAMIN WITH MINERALS) TABS tablet Take 1 tablet by mouth daily.      naproxen (NAPROSYN) 375 MG tablet Take 375 mg by mouth 2 (two) times daily as needed (knee pain.).      Polyethyl Glycol-Propyl Glycol (SYSTANE OP) Place 1 drop into both eyes daily as needed (dry eyes).     potassium chloride SA (KLOR-CON M20) 20 MEQ tablet Take 1 tablet (20 mEq total) by mouth as directed. 1 tablet daily 20 mEq by mouth as directed per alleviate research protocol. 90 tablet 3   pravastatin (PRAVACHOL) 40 MG tablet Take 1 tablet by mouth once daily 90 tablet 3   tamsulosin (FLOMAX) 0.4 MG CAPS capsule Take 1 capsule by mouth once daily 30 capsule 0   temazepam (RESTORIL) 30 MG capsule Take 1 capsule by mouth at bedtime 30 capsule 0   white petrolatum (VASELINE) GEL See admin instructions.     No current facility-administered medications for this visit.    Allergies:   Sulfonamide derivatives and Zolpidem   Social History:  The patient  reports that he quit smoking about 40 years ago. His smoking use included cigarettes. He has a 10.00 pack-year smoking history. He has never used smokeless tobacco. He reports that he does not drink alcohol and does not use drugs.   Family History:  The patient's family history includes Aneurysm in his sister; Cancer in his father; Clotting disorder in his mother; Diabetes in his mother and sister; Heart attack in his mother; Heart disease in his mother; Hyperlipidemia in his mother; Hypertension in his mother and sister; Leukemia in his maternal grandmother; Lung disease in his father; Other in his sister; Seizures in his sister; Stroke in his mother.   ROS:  Please see the history of present illness.    Otherwise, review of systems is positive for none.   All other systems are reviewed and negative.   PHYSICAL EXAM: VS:  There were no vitals taken for this visit. , BMI There is no height or weight on file to calculate BMI. GEN: Well nourished, well developed, in no acute distress  HEENT: normal  Neck: no JVD, carotid bruits, or masses Cardiac: RRR; no murmurs, rubs, or gallops,no edema  Respiratory:  clear to auscultation bilaterally,  normal work of breathing GI: soft, nontender, nondistended, + BS MS: no deformity or atrophy  Skin: warm and dry, device site well healed Neuro:  Strength and sensation are intact Psych: euthymic mood, full affect  EKG:  EKG is ordered today. Personal review of the ekg ordered shows this rhythm, first-degree AV block, rate 84  Personal review of the device interrogation today. Results in [place]: 05/20/2020: BNP 353.1; BUN 33; Creatinine, Ser 1.87; Hemoglobin 15.1; Platelets 197; Potassium 4.4; Sodium 138 05/22/2020: TSH 1.93    Lipid Panel     Component Value Date/Time   CHOL 122 05/22/2020 0933   TRIG 99.0 05/22/2020 0933   HDL 37.50 (L) 05/22/2020 0933   CHOLHDL 3 05/22/2020 0933   VLDL 19.8 05/22/2020 0933   LDLCALC 65 05/22/2020 0933   LDLDIRECT 84.0 05/02/2018 1523     Wt Readings from Last 3 Encounters:  12/11/20 197 lb 3.2 oz (89.4 kg)  10/21/20 193 lb (87.5 kg)  09/11/20 185 lb (83.9 kg)      Other studies Reviewed: Additional studies/ records that were reviewed today include: TTE 12/12/2019 Review of the above records today demonstrates:   1. Left ventricular ejection fraction, by estimation, is 35 to 40%. The  left ventricle has moderately decreased function. The left ventricle  demonstrates global hypokinesis. Indeterminate diastolic filling due to  E-A fusion.   2. Right ventricular systolic function is mildly reduced. The right  ventricular size is mildly enlarged. There is moderately elevated  pulmonary  artery systolic pressure. The estimated right ventricular  systolic pressure is 37.4 mmHg.   3. Left atrial size was moderately dilated.   4. The mitral valve is grossly normal. Mild mitral valve regurgitation.  No evidence of mitral stenosis.   5. The aortic valve is tricuspid. Aortic valve regurgitation is mild to  moderate. No aortic stenosis is present.   6. Pulmonic valve regurgitation is moderate.   7. Aortic dilatation noted. Aneurysm of the aortic root, measuring 45 mm.   8. The inferior vena cava is normal in size with greater than 50%  respiratory variability, suggesting right atrial pressure of 3 mmHg.   ASSESSMENT AND PLAN:  1.  Persistent atrial fibrillation/flutter: Currently on Eliquis with a CHA2DS2-VASc of 3.  Had prior A. fib ablation when he went to heart failure after his procedure.  Came back in atrial flutter and is now status post repeat ablation 03/10/2020.  He was previously having more frequent episodes of atrial fibrillation.  He returns to clinic today.  On Linq monitor, he has been in normal rhythm with minimal atrial fibrillation over the past 6 months.  He currently feels well.  We Lucresia Simic continue with current management.  2.  Chronic systolic heart failure due to nonischemic cardiomyopathy: Ejection fraction 35 to 40%.  Has remained stable over several echoes.  Currently on Toprol-XL, Imdur, hydralazine.  No changes.  3.  Hypertension: Currently well controlled   Current medicines are reviewed at length with the patient today.   The patient does not have concerns regarding his medicines.  The following changes were made today: None  Labs/ tests ordered today include:  No orders of the defined types were placed in this encounter.    Disposition:   FU with Lakresha Stifter 6 months  Signed, Alex Leahy Meredith [name], MD  01/05/2021 2:26 PM     [facility] [address] [place] [address] [phone] (office) [phone] (fax)

## 2021-01-07 ENCOUNTER — Other Ambulatory Visit: Payer: Self-pay | Admitting: Family Medicine

## 2021-01-07 ENCOUNTER — Telehealth: Payer: Self-pay

## 2021-01-07 NOTE — Telephone Encounter (Signed)
Pt called stating he would like referral placed for his first colonoscopy. Pt states he not having any concerns.

## 2021-01-08 ENCOUNTER — Other Ambulatory Visit: Payer: Self-pay | Admitting: Family Medicine

## 2021-01-08 DIAGNOSIS — Z1211 Encounter for screening for malignant neoplasm of colon: Secondary | ICD-10-CM

## 2021-01-08 NOTE — Telephone Encounter (Signed)
Requesting: temazepam Contract:  UDS: 05/02/18 Last Visit: 09/11/20 Next Visit: none Last Refill: 12/09/20  Please Advise

## 2021-01-11 ENCOUNTER — Other Ambulatory Visit: Payer: Self-pay | Admitting: Family Medicine

## 2021-01-11 ENCOUNTER — Other Ambulatory Visit (HOSPITAL_COMMUNITY): Payer: Self-pay | Admitting: Internal Medicine

## 2021-01-11 DIAGNOSIS — M25561 Pain in right knee: Secondary | ICD-10-CM

## 2021-01-11 DIAGNOSIS — E785 Hyperlipidemia, unspecified: Secondary | ICD-10-CM

## 2021-01-11 DIAGNOSIS — D649 Anemia, unspecified: Secondary | ICD-10-CM

## 2021-01-11 DIAGNOSIS — Z7901 Long term (current) use of anticoagulants: Secondary | ICD-10-CM

## 2021-01-11 DIAGNOSIS — G4700 Insomnia, unspecified: Secondary | ICD-10-CM

## 2021-01-11 DIAGNOSIS — H409 Unspecified glaucoma: Secondary | ICD-10-CM

## 2021-01-11 DIAGNOSIS — E875 Hyperkalemia: Secondary | ICD-10-CM

## 2021-01-11 DIAGNOSIS — I10 Essential (primary) hypertension: Secondary | ICD-10-CM

## 2021-01-11 DIAGNOSIS — F411 Generalized anxiety disorder: Secondary | ICD-10-CM

## 2021-01-11 DIAGNOSIS — M25562 Pain in left knee: Secondary | ICD-10-CM

## 2021-01-11 DIAGNOSIS — E1169 Type 2 diabetes mellitus with other specified complication: Secondary | ICD-10-CM

## 2021-01-16 DIAGNOSIS — Z006 Encounter for examination for normal comparison and control in clinical research program: Secondary | ICD-10-CM

## 2021-01-16 NOTE — Research (Signed)
[employer] Research Study  [facility] Transmission

## 2021-01-18 ENCOUNTER — Other Ambulatory Visit: Payer: Self-pay | Admitting: Cardiology

## 2021-01-19 DIAGNOSIS — N2581 Secondary hyperparathyroidism of renal origin: Secondary | ICD-10-CM | POA: Diagnosis not present

## 2021-01-19 DIAGNOSIS — N1832 Chronic kidney disease, stage 3b: Secondary | ICD-10-CM | POA: Diagnosis not present

## 2021-01-19 DIAGNOSIS — E1122 Type 2 diabetes mellitus with diabetic chronic kidney disease: Secondary | ICD-10-CM | POA: Diagnosis not present

## 2021-01-20 ENCOUNTER — Encounter: Payer: Self-pay | Admitting: Gastroenterology

## 2021-02-08 ENCOUNTER — Other Ambulatory Visit: Payer: Self-pay | Admitting: Family Medicine

## 2021-02-10 NOTE — Telephone Encounter (Signed)
Requesting: temazepam Contract: 07/31/18 UDS: 07/31/18  Last Visit: 01/09/21 Next Visit: none  Last Refill: 05/22/20  Please Advise

## 2021-02-20 ENCOUNTER — Other Ambulatory Visit: Payer: Self-pay

## 2021-02-20 ENCOUNTER — Encounter: Payer: Self-pay | Admitting: Cardiovascular Disease

## 2021-02-20 ENCOUNTER — Ambulatory Visit: Payer: Medicare HMO | Admitting: Cardiovascular Disease

## 2021-02-20 VITALS — BP 128/68 | HR 76 | Ht 71.0 in | Wt 189.2 lb

## 2021-02-20 DIAGNOSIS — G4733 Obstructive sleep apnea (adult) (pediatric): Secondary | ICD-10-CM

## 2021-02-20 DIAGNOSIS — I10 Essential (primary) hypertension: Secondary | ICD-10-CM

## 2021-02-20 DIAGNOSIS — E663 Overweight: Secondary | ICD-10-CM | POA: Diagnosis not present

## 2021-02-20 DIAGNOSIS — E1142 Type 2 diabetes mellitus with diabetic polyneuropathy: Secondary | ICD-10-CM

## 2021-02-20 DIAGNOSIS — I351 Nonrheumatic aortic (valve) insufficiency: Secondary | ICD-10-CM | POA: Diagnosis not present

## 2021-02-20 DIAGNOSIS — I5042 Chronic combined systolic (congestive) and diastolic (congestive) heart failure: Secondary | ICD-10-CM

## 2021-02-20 DIAGNOSIS — Z7901 Long term (current) use of anticoagulants: Secondary | ICD-10-CM | POA: Diagnosis not present

## 2021-02-20 DIAGNOSIS — I484 Atypical atrial flutter: Secondary | ICD-10-CM

## 2021-02-20 DIAGNOSIS — N1832 Chronic kidney disease, stage 3b: Secondary | ICD-10-CM

## 2021-02-20 DIAGNOSIS — Z95818 Presence of other cardiac implants and grafts: Secondary | ICD-10-CM

## 2021-02-20 NOTE — Patient Instructions (Signed)

## 2021-02-20 NOTE — Progress Notes (Signed)
Cardiology Office Note    Date:  02/23/2021   ID:  [name], [name] Dec 01, 1950, MRN [id number]  PCP:  Kevin Lukes, MD  Cardiologist:   Kevin Klein, MD   No chief complaint on file.   History of Present Illness:  [name] is a 70 y.o. male with combined systolic and diastolic heart failure, history of paroxysmal atrial flutter and paroxysmal atrial fibrillation status post re-do endocardial ablation (Dr. Curt [name], August 2021), history of recurrent tachycardia cardiomyopathy, moderate aortic insufficiency, longstanding severe obstructive sleep apnea compliant with CPAP, essential hypertension, dyslipidemia, type 2 diabetes mellitus, history of obesity with substantial success at weight loss.  [name] is doing quite well.  He has no cardiovascular complaints and continues to have slow steady weight loss.  His neuropathic symptoms seem to have improved and he is now taking a pretty low dose of gabapentin.  Glycemic control is excellent and his most recent hemoglobin A1c was 6.9%.  He is on [place] and only takes daily" as needed with a larger than usual dinner, a rare occurrence.  He has not had any falls or serious bleeding on Eliquis.  He denies orthopnea, PND, lower extremity edema, intermittent claudication or chest pain at rest or with activity.  He has not had syncope or palpitations.  Sees Dr. Juleen [name], nephrology in [place].  His notes state that he is taking benazepril 2.5 mg once daily, but did not report that he is taking hydralazine/nitrates.  We Kevin need to reconcile our records.  Most recent creatinine was 1.98, GFR 34.  Follow-up echocardiogram performed on July 20, 2019 showed EF back to "baseline" 35-40%.  The aortic insufficiency was described as mild to moderate, ascending aorta 43 mm.  The left atrium was severely dilated.  The left ventricle is normal in size with mild concentric hypertrophy.  TEE performed just before his last ablation procedure on  03/07/2020 showed a similar EF of 35-40%.  The left atrium severely dilated, without thrombus.  There was only mild mitral regurgitation.  Aortic regurgitation was also described as mild.  The aortic root was mildly dilated at 41 mm.  He was very ill at the end of 2020 with severe tachycardia related cardiomyopathy, severely depressed left ventricular systolic function in the setting of atrial flutter with rapid ventricular rates.  He underwent ablation with [name], transition from dofetilide to amiodarone and then was hospitalized for optimization of his heart failure.  A loop recorder was implanted as part of a CHF trial.  He has never really been aware of the palpitations, so episodes of breakthrough arrhythmia have often gone undetected in the past.  Because of a 2-week episode of persistent atrial fibrillation detected by his loop recorder, he underwent cardioversion successfully on March 11.  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  Most recently in December 2020 he had cardiogenic shock and an EF of 10%, underwent pulmonary vein isolation and had a prolonged hospitalization on pressors.  Follow-up echo in February 2021 showed EF of 35-40%.  Prior to that in January 2019 his ejection fraction dropped to 30-35% when he was again unaware of the persistent arrhythmia.  He underwent cardioversion and ranolazine was added to dofetilide with a return to sinus rhythm, improved functional status and improvement in left ventricular ejection fraction to 42% by echo performed in April 2019  In early 2020 he had an upper respiratory infection from which he never felt that he fully rebounded.  He noticed persistent tachycardia throughout the summer 2020 he underwent successful cardioversion on February 27, 2019 but had early recurrence of atrial fibrillation despite increased dose of dofetilide.  His clinical status steadily deteriorated.  On May 16, 2019 TEE showed  LVEF was down to 10%, with severe biatrial dilation and moderate aortic insufficiency, mild mitral insufficiency.  The same day he underwent A. fib/a flutter ablation by Dr. Curt [name] and a right heart catheterization by Dr. Haroldine [name] (RA pressure 15, PA pressure 49/32, wedge pressure mean 27, cardiac index 2.8).  Follow-up echocardiogram in February 2021 showed EF of 35-40%.  He did have another cardioversion in early March for persistent atrial flutter.  His echocardiograms have shown moderate aortic insufficiency related to aortic ectasia.  He has a long-standing history of severe hypertension. He also has long-standing history of obstructive sleep apnea which was also not treated until recently, but he is now 100% CPAP-compliant. Coronary angiography 2011 did not show evidence of significant stenoses. He presented with typical atrial flutter in 2011 and has recurrent paroxysmal atrial fibrillation with a good response to treatment with dofetilide, but the dose of dofetilide was decreased when his renal function deteriorated in 2020. Additional problems include obesity, type 2 diabetes mellitus, gout and androgen deficiency. He had a successful cardioversion in October 2016 and February 2019 for persistent atrial fibrillation.   Past Medical History:  Diagnosis Date   AORTIC STENOSIS, MODERATE 12/05/2009   Atrial fibrillation (HCC)    Atrial fibrillation with RVR ([place]) 07/05/2017   Atrial flutter ([place]) 12/05/2009   Atypical chest pain 11/08/2011   BENIGN PROSTATIC HYPERTROPHY, HX OF 12/05/2009   CHEST PAIN-UNSPECIFIED 11/11/2009   CHF 12/05/2009   Chronic renal insufficiency 12/16/2016   Debility 04/18/2012   Diarrhea 12/16/2016   DYSPNEA 12/19/2009   Edema 04/18/2012   ERECTILE DYSFUNCTION 01/25/2007   FATIGUE, CHRONIC 11/11/2009   Gout 07/10/2012   Heart murmur    History of kidney stones    "they passed" (07/05/2017)   Hyperkalemia 04/18/2012   Hyperlipidemia    Hypertension    HYPERTENSION 01/25/2007    Hypoxia 05/05/2011   Insomnia 05/13/2016   INSOMNIA, HX OF 01/25/2007   Knee pain, bilateral 12/28/2010   Knee pain, right 12/28/2010   NEUROMA 01/05/2010   OBESITY NOS 01/25/2007   OSA on CPAP 04/06/2010   PERIPHERAL NEUROPATHY 01/05/2010   PULMONARY FUNCTION TESTS, ABNORMAL 02/02/2010   Superficial thrombophlebitis of left leg 09/07/2011   TESTICULAR HYPOFUNCTION 01/05/2010   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   Toe pain, right 07/10/2012   Type II diabetes mellitus ([place]) 12/05/2009   UNSPECIFIED ANEMIA 01/05/2010    Past Surgical History:  Procedure Laterality Date   A-FLUTTER ABLATION N/A 05/16/2019   Procedure: A-FLUTTER ABLATION;  Surgeon: Constance Haw, MD;  Location: [facility];  Service: Cardiovascular;  Laterality: N/A;   ATRIAL FIBRILLATION ABLATION N/A 05/16/2019   Procedure: ATRIAL FIBRILLATION ABLATION;  Surgeon: Constance Haw, MD;  Location: [facility];  Service: Cardiovascular;  Laterality: N/A;   ATRIAL FIBRILLATION ABLATION N/A 03/10/2020   Procedure: ATRIAL FIBRILLATION ABLATION;  Surgeon: Constance Haw, MD;  Location: [facility];  Service: Cardiovascular;  Laterality: N/A;   CARDIAC CATHETERIZATION  10/16/2009   nonischemic cardiomyopathy   CARDIOVERSION N/A 03/25/2015   Procedure: CARDIOVERSION;  Surgeon: Pixie Casino, MD;  Location: [facility];  Service: Cardiovascular;  Laterality: N/A;   CARDIOVERSION N/A 06/17/2017   Procedure: CARDIOVERSION;  Surgeon: Kevin Klein, MD;  Location:  [facility];  Service: Cardiovascular;  Laterality: N/A;   CARDIOVERSION N/A 07/08/2017   Procedure: CARDIOVERSION;  Surgeon: Fay Records, MD;  Location: [facility];  Service: Cardiovascular;  Laterality: N/A;   CARDIOVERSION N/A 02/27/2019   Procedure: CARDIOVERSION;  Surgeon: Kevin Klein, MD;  Location: [facility];  Service: Cardiovascular;  Laterality: N/A;   CARDIOVERSION N/A 08/16/2019   Procedure: CARDIOVERSION;  Surgeon: Kevin Klein, MD;   Location: [facility];  Service: Cardiovascular;  Laterality: N/A;   METATARSAL OSTEOTOMY Bilateral ~ 1980   removed part of 5th metatarsal to corect curvature of toes    RIGHT HEART CATH N/A 05/16/2019   Procedure: RIGHT HEART CATH;  Surgeon: Jolaine Artist, MD;  Location: [facility];  Service: Cardiovascular;  Laterality: N/A;   TEE WITHOUT CARDIOVERSION  05/16/2019   Procedure: Transesophageal Echocardiogram (Tee);  Surgeon: Constance Haw, MD;  Location: [facility];  Service: Cardiovascular;;   TEE WITHOUT CARDIOVERSION N/A 03/07/2020   Procedure: TRANSESOPHAGEAL ECHOCARDIOGRAM (TEE);  Surgeon: Freada Bergeron, MD;  Location: [facility];  Service: Cardiovascular;  Laterality: N/A;    Current Medications: Outpatient Medications Prior to Visit  Medication Sig Dispense Refill   Accu-Chek Softclix Lancets lancets Use as instructed 200 each 3   albuterol (VENTOLIN HFA) 108 (90 Base) MCG/ACT inhaler Inhale 2 puffs into the lungs every 6 (six) hours as needed for wheezing or shortness of breath. 1 Inhaler 6   Alcohol Swabs (B-D SINGLE USE SWABS REGULAR) PADS USE AS DIRECTED 200 each 1   allopurinol (ZYLOPRIM) 300 MG tablet Take 1 tablet by mouth once daily 90 tablet 1   apixaban (ELIQUIS) 5 MG TABS tablet Take 1 tablet by mouth twice daily 180 tablet 1   Blood Glucose Monitoring Suppl (ACCU-CHEK GUIDE ME) w/Device KIT Use as instructed to check blood sugar 1-2 times daily. 1 kit 0   calcium carbonate (TUMS - DOSED IN MG ELEMENTAL CALCIUM) 500 MG chewable tablet Chew 500 mg by mouth daily as needed for indigestion or heartburn.     cetirizine (ZYRTEC) 10 MG tablet Take 1 tablet (10 mg total) by mouth daily. 30 tablet 11   clobetasol ointment (TEMOVATE) 8.12 % 1 application to affected area     Clobetasol Prop Emollient Base (CLOBETASOL PROPIONATE E) 0.05 % emollient cream Apply 1 application topically 2 (two) times daily as needed (insect bite). 60 g 1   colchicine  (COLCRYS) 0.6 MG tablet Take one tablet for 4-6 days as needed for gout flare up. 15 tablet 3   dapagliflozin propanediol (FARXIGA) 10 MG TABS tablet TAKE 1 TABLET BY MOUTH ONCE DAILY 90 tablet 3   fish oil-omega-3 fatty acids 1000 MG capsule Take 1 g by mouth daily.     furosemide (LASIX) 80 MG tablet Take 1 tablet (80 mg total) by mouth daily. As needed patient may take an additional 40 MG tablet as direct per [employer] study PRN plan. 95 tablet 3   gabapentin (NEURONTIN) 100 MG capsule Take 2 capsules (200 mg total) by mouth at bedtime. 180 capsule 3   glucose blood (ACCU-CHEK GUIDE) test strip Use as instructed to check blood sugar 1-2 times daily 200 each 3   glyBURIDE (DIABETA) 5 MG tablet Take 1/2 tablet by mouth before dinner 90 tablet 1   hydrALAZINE (APRESOLINE) 25 MG tablet Take 1 tablet (25 mg total) by mouth 3 (three) times daily. 270 tablet 2   HYDROcodone-acetaminophen (NORCO) 10-325 MG tablet Take 1 tablet by  mouth every 8 (eight) hours as needed for moderate pain or severe pain. 90 tablet 0   hydrocortisone 2.5 % ointment Apply topically 2 (two) times daily as needed. 30 g 1   isosorbide mononitrate (IMDUR) 30 MG 24 hr tablet Take 1 tablet (30 mg total) by mouth daily. Need appointment for further refills 30 tablet 2   LORazepam (ATIVAN) 1 MG tablet TAKE 1 TABLET BY MOUTH EVERY 8 HOURS AS NEEDED FOR ANXIETY 30 tablet 2   magnesium oxide (MAG-OX) 400 MG tablet Take 1 tablet (400 mg total) by mouth 2 (two) times daily. 30 tablet 11   metoprolol succinate (TOPROL-XL) 50 MG 24 hr tablet TAKE 1 TABLET BY MOUTH TWICE DAILY TAKE  WITH  OR  IMMEDIATELY  FOLLOWING  A  MEAL 180 tablet 0   Multiple Vitamin (MULTIVITAMIN WITH MINERALS) TABS tablet Take 1 tablet by mouth daily.      naproxen (NAPROSYN) 375 MG tablet Take 375 mg by mouth 2 (two) times daily as needed (knee pain.).      Polyethyl Glycol-Propyl Glycol (SYSTANE OP) Place 1 drop into both eyes daily as needed (dry eyes).      potassium chloride SA (KLOR-CON M20) 20 MEQ tablet Take 1 tablet (20 mEq total) by mouth as directed. 1 tablet daily 20 mEq by mouth as directed per alleviate research protocol. 90 tablet 3   pravastatin (PRAVACHOL) 40 MG tablet Take 1 tablet by mouth once daily 90 tablet 3   tamsulosin (FLOMAX) 0.4 MG CAPS capsule Take 1 capsule by mouth once daily 30 capsule 0   temazepam (RESTORIL) 30 MG capsule Take 1 capsule by mouth at bedtime 30 capsule 0   white petrolatum (VASELINE) GEL See admin instructions.     No facility-administered medications prior to visit.     Allergies:   Sulfonamide derivatives and Zolpidem   Social History   Socioeconomic History   Marital status: Married    Spouse name: Not on file   Number of children: 4   Years of education: 12   Highest education level: High school graduate  Occupational History   Occupation: retired    Fish farm manager: [employer]  Tobacco Use   Smoking status: Former    Packs/day: 1.00    Years: 10.00    Pack years: 10.00    Types: Cigarettes    Quit date: 06/07/1980    Years since quitting: 40.7   Smokeless tobacco: Never  Vaping Use   Vaping Use: Never used  Substance and Sexual Activity   Alcohol use: No   Drug use: No   Sexual activity: Not Currently  Other Topics Concern   Not on file  Social History Narrative   Lives with male partner in a one story home.  His son lives there off and on.  Retired from [employer].  Education: high school.  Right handed   Social Determinants of Health   Financial Resource Strain: Not on file  Food Insecurity: Not on file  Transportation Needs: Not on file  Physical Activity: Not on file  Stress: Not on file  Social Connections: Not on file     Family History:  The patient's family history includes Aneurysm in his sister; Cancer in his father; Clotting disorder in his mother; Diabetes in his mother and sister; Heart attack in his mother; Heart disease in his mother; Hyperlipidemia in his  mother; Hypertension in his mother and sister; Leukemia in his maternal grandmother; Lung disease in his father; Other in  his sister; Seizures in his sister; Stroke in his mother.   ROS:   Please see the history of present illness.    ROS All other systems are reviewed and are negative.  PHYSICAL EXAM:   VS:  BP 128/68   Pulse 76   Ht 5' 11" (1.803 m)   Wt 189 lb 3.2 oz (85.8 kg)   SpO2 99%   BMI 26.39 kg/m      General: Alert, oriented x3, no distress, moderately overweight Head: no evidence of trauma, PERRL, EOMI, no exophtalmos or lid lag, no myxedema, no xanthelasma; normal ears, nose and oropharynx Neck: normal jugular venous pulsations and no hepatojugular reflux; brisk carotid pulses without delay and no carotid bruits Chest: clear to auscultation, no signs of consolidation by percussion or palpation, normal fremitus, symmetrical and full respiratory excursions Cardiovascular: normal position and quality of the apical impulse, regular rhythm, normal first and second heart sounds, 2/6 aortic ejection murmur is early peaking, 2-3/6 aortic insufficiency diastolic decrescendo murmur, rubs or gallops Abdomen: no tenderness or distention, no masses by palpation, no abnormal pulsatility or arterial bruits, normal bowel sounds, no hepatosplenomegaly Extremities: no clubbing, cyanosis or edema; 2+ radial, ulnar and brachial pulses bilaterally; 2+ right femoral, posterior tibial and dorsalis pedis pulses; 2+ left femoral, posterior tibial and dorsalis pedis pulses; no subclavian or femoral bruits Neurological: grossly nonfocal Psych: Normal mood and affect   Wt Readings from Last 3 Encounters:  02/20/21 189 lb 3.2 oz (85.8 kg)  01/05/21 193 lb (87.5 kg)  12/11/20 197 lb 3.2 oz (89.4 kg)   Studies/Labs Reviewed:   TEE 03/07/2020: 1. Left ventricular ejection fraction, by estimation, is 35 to 40%. The  left ventricle has moderately decreased function. The left ventricle  demonstrates  global hypokinesis.   2. Iatrogenic PFO visualized from prior ablation procedure.   3. Right ventricular systolic function is mildly reduced. The right  ventricular size is mildly enlarged.   4. Left atrial size was severely dilated. No left atrial/left atrial  appendage thrombus was detected.   5. The mitral valve is normal in structure. Mild mitral valve  regurgitation.   6. The aortic valve is tricuspid. There is mild calcification of the  aortic valve. There is mild thickening of the aortic valve. Aortic valve  regurgitation is mild. Mild aortic valve sclerosis is present, with no  evidence of aortic valve stenosis.   7. Aortic dilatation noted. There is mild dilatation of the ascending  aorta, measuring 37 mm. There is mild dilatation of the aortic root,  measuring 41 mm. There is Moderate (Grade III) plaque.   EKG:  EKG is not ordered today.  Recorder download shows atrial flutter with controlled ventricular rates mostly in the 80s. It shows normal sinus rhythm with first-degree AV block and rightward axis (90 degrees), mildly broadened QRS at 106 ms and a pattern of incomplete left bundle branch block, nonspecific ST-T changes, QTC 490 ms  Lipid Panel    Component Value Date/Time   CHOL 122 05/22/2020 0933   TRIG 99.0 05/22/2020 0933   HDL 37.50 (L) 05/22/2020 0933   CHOLHDL 3 05/22/2020 0933   VLDL 19.8 05/22/2020 0933   LDLCALC 65 05/22/2020 0933   LDLDIRECT 84.0 05/02/2018 1523     ASSESSMENT:    No diagnosis found.    PLAN:  In order of problems listed above:  1. CHF (combined systolic and diastolic): Clinically euvolemic.  NYHA functional class I-2.  Nonischemic cardiomyopathy.  Enrolled in [employer]  HF trial.  On SGLT2 inhibitor, hydralazine/nitrates, beta-blocker, moderate dose of loop diuretic.  Not on RAAS inhibitors due to renal dysfunction 2. Aflutter/AFib: Remarkably durable benefit following his redo ablation procedure.  Infrequent breakthrough events.   Compliant with anticoagulation.  CHADSVAsc 4 (age, HTN, CHF, history of DM). 3. Moderate AI: This never appear to be severe enough to be a cause of the cardiomyopathy and was actually described as mild on the TEE performed last October. 4. OSA: 100% compliant with CPAP.  Denies daytime hypersomnolence. 5.  Overweight: He has done a remarkably good job of losing weight and keeping it off 6. HTN: Well-controlled. 7.  Anticoagulation: Denies falls or bleeding complications. 9. CKD 3b: Baseline 1.8-2.0.  GFR is around 35-40. Sees Dr. Juleen [name], just had an appointment last month.  On SGLT2 inhibitor and ACE inhibitor.  No recent gout flares. 10. DM: Improved control, most recent hemoglobin A1c in target range of 6.9%. 11. ILR: Implanted as part of a clinical heart failure trial, but also be very useful for monitoring of arrhythmia rate control.  Has demonstrated a very low burden of atrial arrhythmia following his second ablation, even after antiarrhythmics were discontinued.  Kevin help him apply for patient assistance for Eliquis and Wilder Glade which are very costly.  He Kevin return on Monday to help complete all the paperwork.  Medication Adjustments/Labs and Tests Ordered: Current medicines are reviewed at length with the patient today.  Concerns regarding medicines are outlined above.  Medication changes, Labs and Tests ordered today are listed in the Patient Instructions below. Patient Instructions  Medication Instructions:  No changes *If you need a refill on your cardiac medications before your next appointment, please call your pharmacy*   Lab Work: None ordered If you have labs (blood work) drawn today and your tests are completely normal, you Kevin receive your results only by: [place] (if you have MyChart) OR A paper copy in the mail If you have any lab test that is abnormal or we need to change your treatment, we Kevin call you to review the results.   Testing/Procedures: None  ordered   Follow-Up: At [facility], you and your health needs are our priority.  As part of our continuing mission to provide you with exceptional heart care, we have created designated Provider Care Teams.  These Care Teams include your primary Cardiologist (physician) and Advanced Practice Providers (APPs -  Physician Assistants and Nurse Practitioners) who all work together to provide you with the care you need, when you need it.  We recommend signing up for the patient portal called "MyChart".  Sign up information is provided on this After Visit Summary.  MyChart is used to connect with patients for Virtual Visits (Telemedicine).  Patients are able to view lab/test results, encounter notes, upcoming appointments, etc.  Non-urgent messages can be sent to your provider as well.   To learn more about what you can do with MyChart, go to [email or web address].    Your next appointment:   6 month(s)  The format for your next appointment:   In Person  Provider:   You may see Kevin Klein, MD or one of the following Advanced Practice Providers on your designated Care Team:   Almyra Deforest, PA-C Fabian Sharp, [place] or  Roby Lofts, PA-C    Signed, Kevin Klein, MD  02/23/2021 9:39 PM    [address], [place], [place]  [address] Phone: [phone]; Fax: [phone]

## 2021-02-24 ENCOUNTER — Encounter: Payer: Self-pay | Admitting: Internal Medicine

## 2021-02-24 ENCOUNTER — Telehealth: Payer: Self-pay | Admitting: *Deleted

## 2021-02-24 ENCOUNTER — Other Ambulatory Visit: Payer: Self-pay

## 2021-02-24 ENCOUNTER — Ambulatory Visit (INDEPENDENT_AMBULATORY_CARE_PROVIDER_SITE_OTHER): Payer: Medicare HMO | Admitting: Internal Medicine

## 2021-02-24 VITALS — BP 120/72 | HR 65 | Ht 71.0 in | Wt 188.6 lb

## 2021-02-24 DIAGNOSIS — E1169 Type 2 diabetes mellitus with other specified complication: Secondary | ICD-10-CM | POA: Diagnosis not present

## 2021-02-24 DIAGNOSIS — E782 Mixed hyperlipidemia: Secondary | ICD-10-CM | POA: Diagnosis not present

## 2021-02-24 DIAGNOSIS — E663 Overweight: Secondary | ICD-10-CM

## 2021-02-24 DIAGNOSIS — E669 Obesity, unspecified: Secondary | ICD-10-CM

## 2021-02-24 LAB — POCT GLYCOSYLATED HEMOGLOBIN (HGB A1C): Hemoglobin A1C: 7.4 % — AB (ref 4.0–5.6)

## 2021-02-24 MED ORDER — OZEMPIC (0.25 OR 0.5 MG/DOSE) 2 MG/1.5ML ~~LOC~~ SOPN: 0.5000 mg | PEN_INJECTOR | SUBCUTANEOUS | 3 refills | Status: DC

## 2021-02-24 NOTE — Patient Instructions (Addendum)
Please continue: - Farxiga 10 mg before b'fast  Please start Ozempic 0.25 mg weekly in a.m. (for example on Sunday morning) x 4 weeks, then increase to 0.5 mg weekly in a.m. if no nausea or hypoglycemia.   Please return in 4 months with your sugar log.

## 2021-02-24 NOTE — Progress Notes (Signed)
Patient ID: [name], male   DOB: 1950-09-29, 70 y.o.   MRN: [id number]  This visit occurred during the SARS-CoV-2 public health emergency.  Safety protocols were in place, including screening questions prior to the visit, additional usage of staff PPE, and extensive cleaning of exam room while observing appropriate contact time as indicated for disinfecting solutions.   HPI: [name] is a 70 y.o.-year-old male, initially referred by his cardiologist, Dr. Sallyanne Kuster, returning for follow-up DM2, dx in 2011, non-insulin-dependent, uncontrolled, with complications (CHF, CKD, ED).  Last visit 4 months ago.  Interim hx: No increased urination, blurry vision, nausea, chest pain. He continues to do intermittent fasting (eating window 11 AM to 8 PM)  He started to eliminate animal protein since last OV - now off animal protein for at least a month and a half . He also joined the [employer].  He saw his podiatrist in July (Dr. Adah Perl).  Reviewed HbA1c levels Lab Results  Component Value Date   HGBA1C 6.9 (A) 10/21/2020   HGBA1C 6.3 (A) 07/22/2020   HGBA1C 8.1 (A) 04/18/2020   HGBA1C 6.6 (A) 12/14/2019   HGBA1C 5.3 09/14/2019   HGBA1C 6.4 06/11/2019   HGBA1C 5.8 02/06/2019   HGBA1C 5.7 08/03/2018   HGBA1C 7.0 (H) 05/02/2018   HGBA1C 6.7 (H) 01/24/2018   HGBA1C 6.2 10/21/2017   HGBA1C 7.2 (H) 03/18/2017   HGBA1C 7.0 (H) 11/15/2016   HGBA1C 7.5 (H) 08/20/2016   HGBA1C 6.8 (H) 05/21/2016   HGBA1C 6.7 (H) 02/18/2016   HGBA1C 6.4 11/17/2015   HGBA1C 6.4 07/15/2015   HGBA1C 6.6 (H) 04/14/2015   HGBA1C 6.1 01/07/2015   HGBA1C 6.8 (H) 09/13/2014   HGBA1C 6.8 (H) 06/13/2014   HGBA1C 6.6 (H) 01/09/2014   HGBA1C 6.4 (H) 10/08/2013   HGBA1C 7.0 (H) 07/11/2013   HGBA1C 6.6 (H) 01/31/2013   HGBA1C 5.9 (H) 10/31/2012   HGBA1C 6.8 (H) 06/22/2012   HGBA1C 6.7 (H) 03/08/2012   HGBA1C 6.6 (H) 11/04/2011   Pt is on a regimen of: - Farxiga 10 mg daily-started by Dr. Haroldine Laws - Glyburide 5  >> 2.5 mg after b'fast and 0-2.5 mg 2h after dinner >> 0.5 tab as needed before a large meal  He was not able to start Ozempic 0.5 mg- 04/2020 - due to price. He was on Metformin in the past.  Pt checks his sugars once a day: - am: 130, 142-199 >> 95-142, 154, 170 >> 97- 179 >> 144-171 - 2h after b'fast: n/c - before lunch: n/c - 2h after lunch: n/c >> 165 >> n/c >> 224 - before dinner: n/c >> 170 >> 109, 118 >> 81-133, 235 >> 178-227 - 2h after dinner: n/c >> 142-175 >> 109, 122, 144 >> n/c - bedtime: 68-155, 176, 376  >> 154, 157, 200 >> n/c - nighttime: n/c Lowest sugar was 60 >> 100 >> 130 >> 144; he has hypoglycemia awareness in the 50s.  Highest sugar was 376 (cheese cake) >>... 199 >> 175 >> 227.  Glucometer: One Touch verio IQ  Pt's meals are: - Breakfast: oatmeal + berries/apples + cinnamon, cereal, fruit - Lunch: light meal - Dinner:  + veggies, occasionally fried foods (1x a mo) - Snacks: cranberry juice, grape juice, aple juice >> diluted by at least 50%.  -+ CKD-sees Dr. Juleen China, last BUN/creatinine:  Lab Results  Component Value Date   BUN 33 (H) 05/20/2020   BUN 29 (H) 02/25/2020   CREATININE 1.87 (H) 05/20/2020   CREATININE  2.04 (H) 02/25/2020   Lab Results  Component Value Date   GFRAA 42 (L) 05/20/2020   GFRAA 38 (L) 02/25/2020   GFRAA 36 (L) 11/26/2019   GFRAA 39 (L) 11/12/2019   GFRAA 36 (L) 09/18/2019   GFRAA 37 (L) 08/30/2019   GFRAA 39 (L) 08/14/2019   GFRAA 45 (L) 07/11/2019   GFRAA 51 (L) 05/29/2019   GFRAA 58 (L) 05/28/2019  He is not on ACE inhibitor/ARB.  -+ HL; last set of lipids: Lab Results  Component Value Date   CHOL 122 05/22/2020   HDL 37.50 (L) 05/22/2020   LDLCALC 65 05/22/2020   LDLDIRECT 84.0 05/02/2018   TRIG 99.0 05/22/2020   CHOLHDL 3 05/22/2020  On pravastatin 40, omega-3 fatty acids.  - last eye exam was 09/03/2020: Reportedly no DR, + glaucoma. Dr. Manuella Ghazi.  -+ numbness and tingling in his feet.Dr. Sallyanne Kuster started  him on Gabapentin 100 mg daily.  Currently on 100-200 milligrams daily..  Pt has FH of DM in mother, sister.  He is on amiodarone.  No FH of MTC. No personal hx of pancreatitis.  ROS: Constitutional: no weight gain/+ weight loss, no fatigue, no subjective hyperthermia, no subjective hypothermia Eyes: no blurry vision, no xerophthalmia ENT: no sore throat, no nodules palpated in neck, no dysphagia, no odynophagia, no hoarseness Cardiovascular: no CP/no SOB/no palpitations/no leg swelling Respiratory: no cough/no SOB/no wheezing Gastrointestinal: no N/no V/no D/no C/no acid reflux Musculoskeletal: no muscle aches/no joint aches Skin: no rashes, no hair loss Neurological: + tremors/no numbness/no tingling/no dizziness  I reviewed pt's medications, allergies, PMH, social hx, family hx, and changes were documented in the history of present illness. Otherwise, unchanged from my initial visit note.  Past Medical History:  Diagnosis Date   AORTIC STENOSIS, MODERATE 12/05/2009   Atrial fibrillation (HCC)    Atrial fibrillation with RVR ([place]) 07/05/2017   Atrial flutter ([place]) 12/05/2009   Atypical chest pain 11/08/2011   BENIGN PROSTATIC HYPERTROPHY, HX OF 12/05/2009   CHEST PAIN-UNSPECIFIED 11/11/2009   CHF 12/05/2009   Chronic renal insufficiency 12/16/2016   Debility 04/18/2012   Diarrhea 12/16/2016   DYSPNEA 12/19/2009   Edema 04/18/2012   ERECTILE DYSFUNCTION 01/25/2007   FATIGUE, CHRONIC 11/11/2009   Gout 07/10/2012   Heart murmur    History of kidney stones    "they passed" (07/05/2017)   Hyperkalemia 04/18/2012   Hyperlipidemia    Hypertension    HYPERTENSION 01/25/2007   Hypoxia 05/05/2011   Insomnia 05/13/2016   INSOMNIA, HX OF 01/25/2007   Knee pain, bilateral 12/28/2010   Knee pain, right 12/28/2010   NEUROMA 01/05/2010   OBESITY NOS 01/25/2007   OSA on CPAP 04/06/2010   PERIPHERAL NEUROPATHY 01/05/2010   PULMONARY FUNCTION TESTS, ABNORMAL 02/02/2010   Superficial thrombophlebitis of left  leg 09/07/2011   TESTICULAR HYPOFUNCTION 01/05/2010   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   Toe pain, right 07/10/2012   Type II diabetes mellitus ([place]) 12/05/2009   UNSPECIFIED ANEMIA 01/05/2010   Past Surgical History:  Procedure Laterality Date   A-FLUTTER ABLATION N/A 05/16/2019   Procedure: A-FLUTTER ABLATION;  Surgeon: Constance Haw, MD;  Location: [place] [facility];  Service: Cardiovascular;  Laterality: N/A;   ATRIAL FIBRILLATION ABLATION N/A 05/16/2019   Procedure: ATRIAL FIBRILLATION ABLATION;  Surgeon: Constance Haw, MD;  Location: [place] [facility];  Service: Cardiovascular;  Laterality: N/A;   ATRIAL FIBRILLATION ABLATION N/A 03/10/2020   Procedure: ATRIAL FIBRILLATION ABLATION;  Surgeon: Constance Haw, MD;  [facility]  INVASIVE CV LAB;  Service: Cardiovascular;  Laterality: N/A;   CARDIAC CATHETERIZATION  10/16/2009   nonischemic cardiomyopathy   CARDIOVERSION N/A 03/25/2015   Procedure: CARDIOVERSION;  Surgeon: Pixie Casino, MD;  Location: [place];  Service: Cardiovascular;  Laterality: N/A;   CARDIOVERSION N/A 06/17/2017   Procedure: CARDIOVERSION;  Surgeon: Sanda Klein, MD;  Location: [place];  Service: Cardiovascular;  Laterality: N/A;   CARDIOVERSION N/A 07/08/2017   Procedure: CARDIOVERSION;  Surgeon: Fay Records, MD;  Location: [facility] ENDOSCOPY;  Service: Cardiovascular;  Laterality: N/A;   CARDIOVERSION N/A 02/27/2019   Procedure: CARDIOVERSION;  Surgeon: Sanda Klein, MD;  Location: [place];  Service: Cardiovascular;  Laterality: N/A;   CARDIOVERSION N/A 08/16/2019   Procedure: CARDIOVERSION;  Surgeon: Sanda Klein, MD;  Location: [place];  Service: Cardiovascular;  Laterality: N/A;   METATARSAL OSTEOTOMY Bilateral ~ 1980   removed part of 5th metatarsal to corect curvature of toes    RIGHT HEART CATH N/A 05/16/2019   Procedure: RIGHT HEART CATH;  Surgeon: Jolaine Artist, MD;  Location: [place] [facility];  Service: Cardiovascular;   Laterality: N/A;   TEE WITHOUT CARDIOVERSION  05/16/2019   Procedure: Transesophageal Echocardiogram (Tee);  Surgeon: Constance Haw, MD;  Location: [place] [facility];  Service: Cardiovascular;;   TEE WITHOUT CARDIOVERSION N/A 03/07/2020   Procedure: TRANSESOPHAGEAL ECHOCARDIOGRAM (TEE);  Surgeon: Freada Bergeron, MD;  Location: [facility] ENDOSCOPY;  Service: Cardiovascular;  Laterality: N/A;   Social History   Socioeconomic History   Marital status: Married    Spouse name: Not on file   Number of children: 4   Years of education: 12   Highest education level: High school graduate  Occupational History   Occupation: retired    Fish farm manager: [employer]  Tobacco Use   Smoking status: Former    Packs/day: 1.00    Years: 10.00    Pack years: 10.00    Types: Cigarettes    Quit date: 06/07/1980    Years since quitting: 40.7   Smokeless tobacco: Never  Vaping Use   Vaping Use: Never used  Substance and Sexual Activity   Alcohol use: No   Drug use: No   Sexual activity: Not Currently  Other Topics Concern   Not on file  Social History Narrative   Lives with male partner in a one story home.  His son lives there off and on.  Retired from [employer].  Education: high school.  Right handed   Social Determinants of Health   Financial Resource Strain: Not on file  Food Insecurity: Not on file  Transportation Needs: Not on file  Physical Activity: Not on file  Stress: Not on file  Social Connections: Not on file  Intimate Partner Violence: Not on file   Current Outpatient Medications on File Prior to Visit  Medication Sig Dispense Refill   Accu-Chek Softclix Lancets lancets Use as instructed 200 each 3   albuterol (VENTOLIN HFA) 108 (90 Base) MCG/ACT inhaler Inhale 2 puffs into the lungs every 6 (six) hours as needed for wheezing or shortness of breath. 1 Inhaler 6   Alcohol Swabs (B-D SINGLE USE SWABS REGULAR) PADS USE AS DIRECTED 200 each 1   allopurinol (ZYLOPRIM) 300 MG tablet  Take 1 tablet by mouth once daily 90 tablet 1   apixaban (ELIQUIS) 5 MG TABS tablet Take 1 tablet by mouth twice daily 180 tablet 1   Blood Glucose Monitoring Suppl (ACCU-CHEK GUIDE ME) w/Device KIT Use as instructed to  check blood sugar 1-2 times daily. 1 kit 0   calcium carbonate (TUMS - DOSED IN MG ELEMENTAL CALCIUM) 500 MG chewable tablet Chew 500 mg by mouth daily as needed for indigestion or heartburn.     cetirizine (ZYRTEC) 10 MG tablet Take 1 tablet (10 mg total) by mouth daily. 30 tablet 11   clobetasol ointment (TEMOVATE) 2.02 % 1 application to affected area     Clobetasol Prop Emollient Base (CLOBETASOL PROPIONATE E) 0.05 % emollient cream Apply 1 application topically 2 (two) times daily as needed (insect bite). 60 g 1   colchicine (COLCRYS) 0.6 MG tablet Take one tablet for 4-6 days as needed for gout flare up. 15 tablet 3   dapagliflozin propanediol (FARXIGA) 10 MG TABS tablet TAKE 1 TABLET BY MOUTH ONCE DAILY 90 tablet 3   fish oil-omega-3 fatty acids 1000 MG capsule Take 1 g by mouth daily.     furosemide (LASIX) 80 MG tablet Take 1 tablet (80 mg total) by mouth daily. As needed patient may take an additional 40 MG tablet as direct per [employer] study PRN plan. 95 tablet 3   gabapentin (NEURONTIN) 100 MG capsule Take 2 capsules (200 mg total) by mouth at bedtime. 180 capsule 3   glucose blood (ACCU-CHEK GUIDE) test strip Use as instructed to check blood sugar 1-2 times daily 200 each 3   glyBURIDE (DIABETA) 5 MG tablet Take 1/2 tablet by mouth before dinner 90 tablet 1   hydrALAZINE (APRESOLINE) 25 MG tablet Take 1 tablet (25 mg total) by mouth 3 (three) times daily. 270 tablet 2   HYDROcodone-acetaminophen (NORCO) 10-325 MG tablet Take 1 tablet by mouth every 8 (eight) hours as needed for moderate pain or severe pain. 90 tablet 0   hydrocortisone 2.5 % ointment Apply topically 2 (two) times daily as needed. 30 g 1   isosorbide mononitrate (IMDUR) 30 MG 24 hr tablet Take 1  tablet (30 mg total) by mouth daily. Need appointment for further refills 30 tablet 2   LORazepam (ATIVAN) 1 MG tablet TAKE 1 TABLET BY MOUTH EVERY 8 HOURS AS NEEDED FOR ANXIETY 30 tablet 2   magnesium oxide (MAG-OX) 400 MG tablet Take 1 tablet (400 mg total) by mouth 2 (two) times daily. 30 tablet 11   metoprolol succinate (TOPROL-XL) 50 MG 24 hr tablet TAKE 1 TABLET BY MOUTH TWICE DAILY TAKE  WITH  OR  IMMEDIATELY  FOLLOWING  A  MEAL 180 tablet 0   Multiple Vitamin (MULTIVITAMIN WITH MINERALS) TABS tablet Take 1 tablet by mouth daily.      naproxen (NAPROSYN) 375 MG tablet Take 375 mg by mouth 2 (two) times daily as needed (knee pain.).      Polyethyl Glycol-Propyl Glycol (SYSTANE OP) Place 1 drop into both eyes daily as needed (dry eyes).     potassium chloride SA (KLOR-CON M20) 20 MEQ tablet Take 1 tablet (20 mEq total) by mouth as directed. 1 tablet daily 20 mEq by mouth as directed per alleviate research protocol. 90 tablet 3   pravastatin (PRAVACHOL) 40 MG tablet Take 1 tablet by mouth once daily 90 tablet 3   tamsulosin (FLOMAX) 0.4 MG CAPS capsule Take 1 capsule by mouth once daily 30 capsule 0   temazepam (RESTORIL) 30 MG capsule Take 1 capsule by mouth at bedtime 30 capsule 0   white petrolatum (VASELINE) GEL See admin instructions.     No current facility-administered medications on file prior to visit.   Allergies  Allergen  Reactions   Sulfonamide Derivatives Other (See Comments)    Flu like symptoms    Zolpidem Other (See Comments)    Excessive, prolonged sedation   Family History  Problem Relation Age of Onset   Clotting disorder Mother    Heart disease Mother        s/p MI   Heart attack Mother    Hypertension Mother    Diabetes Mother    Hyperlipidemia Mother    Stroke Mother    Cancer Father        ? lung   Lung disease Father        smoker   Diabetes Sister    Hypertension Sister        smoker   Leukemia Maternal Grandmother        ?   Aneurysm Sister         brain   Other Sister        clipped   Seizures Sister        d/o w/aneurysm/ smoker   PE: BP 120/72 (BP Location: Right Arm, Patient Position: Sitting, Cuff Size: Normal)   Pulse 65   Ht $R'5\' 11"'SO$  (1.803 m)   Wt 188 lb 9.6 oz (85.5 kg)   SpO2 98%   BMI 26.30 kg/m  Wt Readings from Last 3 Encounters:  02/24/21 188 lb 9.6 oz (85.5 kg)  02/20/21 189 lb 3.2 oz (85.8 kg)  01/05/21 193 lb (87.5 kg)   Constitutional: overweight, in NAD Eyes: PERRLA, EOMI, no exophthalmos ENT: moist mucous membranes, no thyromegaly, no cervical lymphadenopathy Cardiovascular: RRR, No MRG Respiratory: CTA B Gastrointestinal: abdomen soft, NT, ND, BS+ Musculoskeletal: no deformities, strength intact in all 4 Skin: moist, warm, no rashes Neurological: no tremor with outstretched hands, DTR normal in all 4  ASSESSMENT: 1. DM2, non-insulin-dependent, uncontrolled, with long-term complications - s and d CHF - A fib - s/p cardioversion - 05/2019 >> went into cardiac shock - CKD - sees nephrology  2.  Overweight  3. HL  4.  Peripheral neuropathy  PLAN:  1. Patient with longstanding, previously uncontrolled type 2 diabetes, on SGLT2 inhibitor only.  He also has sulfonylurea and and to use as needed before a larger meal.  He did try to add Ozempic but he was not able to start due to price.  Sugars improved significantly after he started to improve his diet and HbA1c decreased to 6.3%.  At last visit, this was slightly higher, at 6.9%.  At that time, discussed about switching to a more plant-based diet and I gave him references.  We did not change his regimen. -Since last visit, he mentions that he read all the references I gave him about a whole food plant-based diet and started to adopt it.  He also started to go to the [facility]  He lost weight and feels better. -However, per review of his blood sugars at home, they are still above target in the morning and they increase later in the day. -For now, I advised  him to continue with his diet but we also decided to try again to add a GLP-1 receptor agonist to his regimen.  Discussed about benefits and possible side effects.  We will try again Ozempic at a low dose and increase as tolerated.  If this is not covered, will need to send a preauthorization. - I suggested to:  Patient Instructions  Please continue: - Farxiga 10 mg before b'fast  Please start Ozempic 0.25 mg weekly in  a.m. (for example on Sunday morning) x 4 weeks, then increase to 0.5 mg weekly in a.m. if no nausea or hypoglycemia.   Please return in 4 months with your sugar log.   - we checked his HbA1c: 7.4% (higher) - advised to check sugars at different times of the day - 1x a day, rotating check times - advised for yearly eye exams >> he is UTD - return to clinic in 4 months  2.  Overweight -Continue SGLT 2 inhibitor which should also help with weight loss -He is also doing intermittent fasting -He lost approximately 5 pounds pounds since last visit  3. HL -Reviewed latest lipid panel from 05/2020: Fractions at goal with exception of a slightly low HDL: Lab Results  Component Value Date   CHOL 122 05/22/2020   HDL 37.50 (L) 05/22/2020   LDLCALC 65 05/22/2020   LDLDIRECT 84.0 05/02/2018   TRIG 99.0 05/22/2020   CHOLHDL 3 05/22/2020  -He continues pravastatin 40 mg daily and omega-3 fatty acids, without side effects  4.  Peripheral neuropathy -most likely related to diabetes -He is not taking gabapentin 100-200 mg at bedtime, initially added by Dr. Sallyanne Kuster -Numbness and tingling still persist, but he gets relief especially from the higher gabapentin dose. -He needs diabetic shoes.  He saw the podiatrist and was told to come here for a prescription.  We will try to find out how to send this.  Philemon Kingdom, MD PhD [facility]

## 2021-02-24 NOTE — Telephone Encounter (Signed)
Assistance forms faxed for Farxiga and Eliquis.

## 2021-02-25 ENCOUNTER — Telehealth: Payer: Self-pay | Admitting: Internal Medicine

## 2021-02-25 NOTE — Telephone Encounter (Signed)
Ok, let's try a PA. Ty! C

## 2021-02-25 NOTE — Telephone Encounter (Signed)
Pt calling in voiced that the ozempic is to expensive for patient and you would like to know what Dr.Gherghes thoughts are about this... pt is stating that a PA was talked about for this medication . Pt would like a call back regarding this. Pt would like a call back at [phone]

## 2021-02-25 NOTE — Telephone Encounter (Signed)
Alternative or Patient assistance?

## 2021-02-26 ENCOUNTER — Telehealth: Payer: Self-pay | Admitting: Family Medicine

## 2021-02-26 NOTE — Telephone Encounter (Signed)
Patient called in and wants to know if it's possible to have [name] sched him labs for blood work. He requested all labs to be done. A1C and Dexcom meter as well. He is not sched until 07/06/2021. Pt can be reached at [phone]. Please advise

## 2021-02-27 ENCOUNTER — Encounter: Payer: Self-pay | Admitting: Gastroenterology

## 2021-02-27 ENCOUNTER — Telehealth: Payer: Self-pay

## 2021-02-27 ENCOUNTER — Ambulatory Visit: Payer: Medicare HMO | Admitting: Gastroenterology

## 2021-02-27 VITALS — BP 114/76 | HR 72 | Ht 71.0 in | Wt 188.6 lb

## 2021-02-27 DIAGNOSIS — Z7902 Long term (current) use of antithrombotics/antiplatelets: Secondary | ICD-10-CM

## 2021-02-27 DIAGNOSIS — R931 Abnormal findings on diagnostic imaging of heart and coronary circulation: Secondary | ICD-10-CM | POA: Insufficient documentation

## 2021-02-27 DIAGNOSIS — I4819 Other persistent atrial fibrillation: Secondary | ICD-10-CM

## 2021-02-27 DIAGNOSIS — N1832 Chronic kidney disease, stage 3b: Secondary | ICD-10-CM

## 2021-02-27 DIAGNOSIS — G4733 Obstructive sleep apnea (adult) (pediatric): Secondary | ICD-10-CM | POA: Diagnosis not present

## 2021-02-27 DIAGNOSIS — Z1211 Encounter for screening for malignant neoplasm of colon: Secondary | ICD-10-CM | POA: Diagnosis not present

## 2021-02-27 MED ORDER — GOLYTELY 236 G PO SOLR: 4000.0000 mL | Freq: Once | ORAL | 0 refills | Status: AC

## 2021-02-27 NOTE — Telephone Encounter (Signed)
Called and advised pt PA started and Patient assistance application left for pt to pick up.

## 2021-02-27 NOTE — Telephone Encounter (Signed)
Should be holding Eliquis for 2 days, not Plavix

## 2021-02-27 NOTE — Patient Instructions (Signed)
If you are age 68 or older, your body mass index should be between 23-30. Your Body mass index is 26.3 kg/m. If this is out of the aforementioned range listed, please consider follow up with your Primary Care Provider.  If you are age 11 or younger, your body mass index should be between 19-25. Your Body mass index is 26.3 kg/m. If this is out of the aformentioned range listed, please consider follow up with your Primary Care Provider.   __________________________________________________________  The [facility] providers would like to encourage you to use [facility] to communicate with providers for non-urgent requests or questions.  Due to long hold times on the telephone, sending your provider a message by [facility] may be a faster and more efficient way to get a response.  Please allow 48 business hours for a response.  Please remember that this is for non-urgent requests.   You have been scheduled for a colonoscopy. Please follow written instructions given to you at your visit today.  Please pick up your prep supplies at the pharmacy within the next 1-3 days. If you use inhalers (even only as needed), please bring them with you on the day of your procedure.  Due to recent changes in healthcare laws, you may see the results of your imaging and laboratory studies on MyChart before your provider has had a chance to review them.  We understand that in some cases there may be results that are confusing or concerning to you. Not all laboratory results come back in the same time frame and the provider may be waiting for multiple results in order to interpret others.  Please give [place] 48 hours in order for your provider to thoroughly review all the results before contacting the office for clarification of your results.   You will be contacted by our office prior to your procedure for directions on holding your Eliquis.  If you do not hear from our office 1 week prior to your scheduled procedure, please call  [phone] to discuss.    It was a pleasure to see you today!  Thank you for trusting me with your gastrointestinal care!

## 2021-02-27 NOTE — Telephone Encounter (Signed)
[facility] Pre-operative Risk Assessment     Request for surgical clearance:     Endoscopy Procedure  What type of surgery is being performed?     Colonoscopy  When is this surgery scheduled?     03-10-2021  What type of clearance is required ?   Pharmacy  Are there any medications that need to be held prior to surgery and how long? Yes, Eliquis, 2 days  Practice name and name of physician performing surgery?      [facility]  What is your office phone and fax number?      Phone- [phone]  [phone]  Anesthesia type (None, local, MAC, general) ?       MAC

## 2021-02-27 NOTE — Progress Notes (Signed)
[name] Consult Note:  History: [name] 02/27/2021  Referring provider: [name], MD  Reason for consult/chief complaint: Colonoscopy (Never had a Colonoscopy in the past. He has no complaints at this time. Currently on Eliquis managed  by Dr. Loletha Grayer.)   Subjective  HPI:  This is a very pleasant 70 year old man referred by primary care to discuss colon cancer screening. He has had no prior CRC screening, and denies abdominal pain, altered bowel habits or rectal bleeding.  He denies heartburn, dysphagia, odynophagia nausea or vomiting.  [name] stays active and denies exertional chest pain or dyspnea.  He is retired but has many hobbies and is glad that his health has improved in the last couple of years with stabilization of his heart condition.  Recent endocrinology office note reviewed, patient has peripheral neuropathy, hemoglobin A1c 7.4, was to add Ozempic to his current treatment. Recent nephrology office note also reviewed (outside hospital system but in epic) 01/05/2021 electrophysiology office note reviewed with the following: "1.  Persistent atrial fibrillation/flutter: Currently on Eliquis with a CHA2DS2-VASc of 3.  Had prior A. fib ablation when he went to heart failure after his procedure.  Came back in atrial flutter and is now status post repeat ablation 03/10/2020.  He was previously having more frequent episodes of atrial fibrillation.  He returns to clinic today.  On Linq monitor, he has been in normal rhythm with minimal atrial fibrillation over the past 6 months.  He currently feels well.  We will continue with current management.   2.  Chronic systolic heart failure due to nonischemic cardiomyopathy: Ejection fraction 35 to 40%.  Has remained stable over several echoes.  Currently on Toprol-XL, Imdur, hydralazine.  No changes.   3.  Hypertension: Currently well controlled     Current medicines are reviewed at length with the patient  today.   The patient does not have concerns regarding his medicines.  The following changes were made today: None   Labs/ tests ordered today include:  No orders of the defined types were placed in this encounter."  02/20/21 Cards note ([place]) also reviewed  ROS:  Review of Systems  Constitutional:  Negative for appetite change and unexpected weight change.  HENT:  Negative for mouth sores and voice change.   Eyes:  Negative for pain and redness.  Respiratory:  Negative for cough and shortness of breath.   Cardiovascular:  Negative for chest pain and palpitations.  Genitourinary:  Negative for dysuria and hematuria.  Musculoskeletal:  Negative for arthralgias and myalgias.  Skin:  Negative for pallor and rash.  Neurological:  Negative for weakness and headaches.  Hematological:  Negative for adenopathy.    Past Medical History: Past Medical History:  Diagnosis Date   AORTIC STENOSIS, MODERATE 12/05/2009   Atrial fibrillation (HCC)    Atrial fibrillation with RVR ([place]) 07/05/2017   Atrial flutter ([employer]) 12/05/2009   Atypical chest pain 11/08/2011   BENIGN PROSTATIC HYPERTROPHY, HX OF 12/05/2009   CHEST PAIN-UNSPECIFIED 11/11/2009   CHF 12/05/2009   Chronic renal insufficiency 12/16/2016   Debility 04/18/2012   Diarrhea 12/16/2016   DYSPNEA 12/19/2009   Edema 04/18/2012   ERECTILE DYSFUNCTION 01/25/2007   FATIGUE, CHRONIC 11/11/2009   Gout 07/10/2012   Heart murmur    History of kidney stones    "they passed" (07/05/2017)   Hyperkalemia 04/18/2012   Hyperlipidemia    Hypertension    HYPERTENSION 01/25/2007   Hypoxia 05/05/2011   Insomnia 05/13/2016  INSOMNIA, HX OF 01/25/2007   Knee pain, bilateral 12/28/2010   Knee pain, right 12/28/2010   NEUROMA 01/05/2010   OBESITY NOS 01/25/2007   OSA on CPAP 04/06/2010   PERIPHERAL NEUROPATHY 01/05/2010   PULMONARY FUNCTION TESTS, ABNORMAL 02/02/2010   Superficial thrombophlebitis of left leg 09/07/2011   TESTICULAR HYPOFUNCTION 01/05/2010   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   Toe pain, right 07/10/2012   Type II diabetes mellitus ([place]) 12/05/2009   UNSPECIFIED ANEMIA 01/05/2010     Past Surgical History: Past Surgical History:  Procedure Laterality Date   A-FLUTTER ABLATION N/A 05/16/2019   Procedure: A-FLUTTER ABLATION;  Surgeon: Constance Haw, MD;  Location: [place] [facility];  Service: Cardiovascular;  Laterality: N/A;   ATRIAL FIBRILLATION ABLATION N/A 05/16/2019   Procedure: ATRIAL FIBRILLATION ABLATION;  Surgeon: Constance Haw, MD;  Location: [place] [facility];  Service: Cardiovascular;  Laterality: N/A;   ATRIAL FIBRILLATION ABLATION N/A 03/10/2020   Procedure: ATRIAL FIBRILLATION ABLATION;  Surgeon: Constance Haw, MD;  Location: [place] [facility];  Service: Cardiovascular;  Laterality: N/A;   CARDIAC CATHETERIZATION  10/16/2009   nonischemic cardiomyopathy   CARDIOVERSION N/A 03/25/2015   Procedure: CARDIOVERSION;  Surgeon: Pixie Casino, MD;  Location: [facility];  Service: Cardiovascular;  Laterality: N/A;   CARDIOVERSION N/A 06/17/2017   Procedure: CARDIOVERSION;  Surgeon: Sanda Klein, MD;  Location: [facility];  Service: Cardiovascular;  Laterality: N/A;   CARDIOVERSION N/A 07/08/2017   Procedure: CARDIOVERSION;  Surgeon: Fay Records, MD;  Location[facility];  Service: Cardiovascular;  Laterality: N/A;   CARDIOVERSION N/A 02/27/2019   Procedure: CARDIOVERSION;  Surgeon: Sanda Klein, MD;  Location: [place];  Service: Cardiovascular;  Laterality: N/A;   CARDIOVERSION N/A 08/16/2019   Procedure: CARDIOVERSION;  Surgeon: Sanda Klein, MD;  Location: [place];  Service: Cardiovascular;  Laterality: N/A;   METATARSAL OSTEOTOMY Bilateral ~ 1980   removed part of 5th metatarsal to corect curvature of toes    RIGHT HEART CATH N/A 05/16/2019   Procedure: RIGHT HEART CATH;  Surgeon: Jolaine Artist, MD;  Location: [place] [facility];  Service: Cardiovascular;  Laterality: N/A;   TEE  WITHOUT CARDIOVERSION  05/16/2019   Procedure: Transesophageal Echocardiogram (Tee);  Surgeon: Constance Haw, MD;  Location: [place] [facility];  Service: Cardiovascular;;   TEE WITHOUT CARDIOVERSION N/A 03/07/2020   Procedure: TRANSESOPHAGEAL ECHOCARDIOGRAM (TEE);  Surgeon: Freada Bergeron, MD;  Location: [facility];  Service: Cardiovascular;  Laterality: N/A;     Family History: Family History  Problem Relation Age of Onset   Clotting disorder Mother    Heart disease Mother        s/p MI   Heart attack Mother    Hypertension Mother    Diabetes Mother    Hyperlipidemia Mother    Stroke Mother    Cancer Father        ? lung   Lung disease Father        smoker   Diabetes Sister    Hypertension Sister        smoker   Aneurysm Sister        brain   Other Sister        clipped   Seizures Sister        d/o w/aneurysm/ smoker   Leukemia Maternal Grandmother        ?   Colon cancer Neg Hx    Colon polyps Neg Hx     Social History:  Social History   Socioeconomic History   Marital status: Married    Spouse name: Not on file   Number of children: 4   Years of education: 12   Highest education level: High school graduate  Occupational History   Occupation: retired    Fish farm manager: [employer]  Tobacco Use   Smoking status: Former    Packs/day: 1.00    Years: 10.00    Pack years: 10.00    Types: Cigarettes    Quit date: 06/07/1980    Years since quitting: 40.7   Smokeless tobacco: Never  Vaping Use   Vaping Use: Never used  Substance and Sexual Activity   Alcohol use: No   Drug use: No   Sexual activity: Not Currently  Other Topics Concern   Not on file  Social History Narrative   Lives with male partner in a one story home.  His son lives there off and on.  Retired from [employer].  Education: high school.  Right handed   Social Determinants of Health   Financial Resource Strain: Not on file  Food Insecurity: Not on file  Transportation Needs: Not on  file  Physical Activity: Not on file  Stress: Not on file  Social Connections: Not on file    Allergies: Allergies  Allergen Reactions   Sulfonamide Derivatives Other (See Comments)    Flu like symptoms    Zolpidem Other (See Comments)    Excessive, prolonged sedation    Outpatient Meds: Current Outpatient Medications  Medication Sig Dispense Refill   Accu-Chek Softclix Lancets lancets Use as instructed 200 each 3   albuterol (VENTOLIN HFA) 108 (90 Base) MCG/ACT inhaler Inhale 2 puffs into the lungs every 6 (six) hours as needed for wheezing or shortness of breath. 1 Inhaler 6   Alcohol Swabs (B-D SINGLE USE SWABS REGULAR) PADS USE AS DIRECTED 200 each 1   allopurinol (ZYLOPRIM) 300 MG tablet Take 1 tablet by mouth once daily 90 tablet 1   apixaban (ELIQUIS) 5 MG TABS tablet Take 1 tablet by mouth twice daily 180 tablet 1   Blood Glucose Monitoring Suppl (ACCU-CHEK GUIDE ME) w/Device KIT Use as instructed to check blood sugar 1-2 times daily. 1 kit 0   calcium carbonate (TUMS - DOSED IN MG ELEMENTAL CALCIUM) 500 MG chewable tablet Chew 500 mg by mouth daily as needed for indigestion or heartburn.     cetirizine (ZYRTEC) 10 MG tablet Take 1 tablet (10 mg total) by mouth daily. 30 tablet 11   clobetasol ointment (TEMOVATE) 8.11 % 1 application to affected area     Clobetasol Prop Emollient Base (CLOBETASOL PROPIONATE E) 0.05 % emollient cream Apply 1 application topically 2 (two) times daily as needed (insect bite). 60 g 1   colchicine (COLCRYS) 0.6 MG tablet Take one tablet for 4-6 days as needed for gout flare up. 15 tablet 3   dapagliflozin propanediol (FARXIGA) 10 MG TABS tablet TAKE 1 TABLET BY MOUTH ONCE DAILY 90 tablet 3   fish oil-omega-3 fatty acids 1000 MG capsule Take 1 g by mouth daily.     furosemide (LASIX) 80 MG tablet Take 1 tablet (80 mg total) by mouth daily. As needed patient may take an additional 40 MG tablet as direct per [employer] study PRN plan. 95 tablet  3   gabapentin (NEURONTIN) 100 MG capsule Take 2 capsules (200 mg total) by mouth at bedtime. 180 capsule 3   glucose blood (ACCU-CHEK GUIDE) test strip Use as instructed to  check blood sugar 1-2 times daily 200 each 3   glyBURIDE (DIABETA) 5 MG tablet Take 1/2 tablet by mouth before dinner 90 tablet 1   hydrALAZINE (APRESOLINE) 25 MG tablet Take 1 tablet (25 mg total) by mouth 3 (three) times daily. 270 tablet 2   HYDROcodone-acetaminophen (NORCO) 10-325 MG tablet Take 1 tablet by mouth every 8 (eight) hours as needed for moderate pain or severe pain. 90 tablet 0   hydrocortisone 2.5 % ointment Apply topically 2 (two) times daily as needed. 30 g 1   isosorbide mononitrate (IMDUR) 30 MG 24 hr tablet Take 1 tablet (30 mg total) by mouth daily. Need appointment for further refills 30 tablet 2   LORazepam (ATIVAN) 1 MG tablet TAKE 1 TABLET BY MOUTH EVERY 8 HOURS AS NEEDED FOR ANXIETY 30 tablet 2   magnesium oxide (MAG-OX) 400 MG tablet Take 1 tablet (400 mg total) by mouth 2 (two) times daily. 30 tablet 11   metoprolol succinate (TOPROL-XL) 50 MG 24 hr tablet TAKE 1 TABLET BY MOUTH TWICE DAILY TAKE  WITH  OR  IMMEDIATELY  FOLLOWING  A  MEAL 180 tablet 0   Multiple Vitamin (MULTIVITAMIN WITH MINERALS) TABS tablet Take 1 tablet by mouth daily.      naproxen (NAPROSYN) 375 MG tablet Take 375 mg by mouth 2 (two) times daily as needed (knee pain.).      Polyethyl Glycol-Propyl Glycol (SYSTANE OP) Place 1 drop into both eyes daily as needed (dry eyes).     polyethylene glycol (GOLYTELY) 236 g solution Take 4,000 mLs by mouth once for 1 dose. 4000 mL 0   potassium chloride SA (KLOR-CON M20) 20 MEQ tablet Take 1 tablet (20 mEq total) by mouth as directed. 1 tablet daily 20 mEq by mouth as directed per alleviate research protocol. 90 tablet 3   pravastatin (PRAVACHOL) 40 MG tablet Take 1 tablet by mouth once daily 90 tablet 3   Semaglutide,0.25 or 0.5MG /DOS, (OZEMPIC, 0.25 OR 0.5 MG/DOSE,) 2 MG/1.5ML SOPN  Inject 0.5 mg into the skin once a week. 4.5 mL 3   tamsulosin (FLOMAX) 0.4 MG CAPS capsule Take 1 capsule by mouth once daily 30 capsule 0   temazepam (RESTORIL) 30 MG capsule Take 1 capsule by mouth at bedtime 30 capsule 0   white petrolatum (VASELINE) GEL See admin instructions.     No current facility-administered medications for this visit.      ___________________________________________________________________ Objective   Exam:  BP 114/76   Pulse 72   Ht 5\' 11"  (1.803 m)   Wt 188 lb 9.6 oz (85.5 kg)   BMI 26.30 kg/m  Wt Readings from Last 3 Encounters:  02/27/21 188 lb 9.6 oz (85.5 kg)  02/24/21 188 lb 9.6 oz (85.5 kg)  02/20/21 189 lb 3.2 oz (85.8 kg)    General: Well-appearing, pleasant and conversational, gets on exam table without difficulty Eyes: sclera anicteric, no redness ENT: oral mucosa moist without lesions, no cervical or supraclavicular lymphadenopathy CV: RRR with 4/6 diastolic murmur, W2/X9, no JVD, no peripheral edema Resp: clear to auscultation bilaterally, normal RR and effort noted GI: soft, no tenderness, with active bowel sounds. No guarding or palpable organomegaly noted. Skin; warm and dry, no rash or jaundice noted Neuro: awake, alert and oriented x 3. Normal gross motor function and fluent speech  Labs:  CBC Latest Ref Rng & Units 05/20/2020 02/25/2020 11/12/2019  WBC 3.4 - 10.8 x10E3/uL 4.1 4.4 4.0  Hemoglobin 13.0 - 17.7 g/dL 15.1 13.3 10.6(L)  Hematocrit 37.5 - 51.0 % 47.0 42.3 36.9(L)  Platelets 150 - 450 x10E3/uL 197 183 208   CMP Latest Ref Rng & Units 05/20/2020 02/25/2020 11/26/2019  Glucose 65 - 99 mg/dL 142(H) 127(H) 138(H)  BUN 8 - 27 mg/dL 33(H) 29(H) 34(H)  Creatinine 0.76 - 1.27 mg/dL 1.87(H) 2.04(H) 2.16(H)  Sodium 134 - 144 mmol/L 138 139 139  Potassium 3.5 - 5.2 mmol/L 4.4 3.9 4.7  Chloride 96 - 106 mmol/L 102 100 105  CO2 20 - 29 mmol/L _0 Calcium 8.6 - 10.2 mg/dL 9.4 9.5 9.1  Total Protein 6.0 - 8.3 g/dL - - -   Total Bilirubin 0.2 - 1.2 mg/dL - - -  Alkaline Phos 39 - 117 U/L - - -  AST 0 - 37 U/L - - -  ALT 0 - 53 U/L - - -   From most recent nephrology note:  Lab Units 07/08/20 1335 01/02/20 1334 09/26/19 1359  SODIUM mmol/L 137 138 140  POTASSIUM mmol/L 3.7 4.1 4.1  CHLORIDE mmol/L 99 103 105  CO2 mmol/L _1 CALCIUM mg/dL 9.5 9.1 9.7  PHOSPHORUS mg/dL 3.7 3.5 3.5  PTH pg/mL 38 -- --  GLUCOSE mg/dL 178* 210* 139*  ALBUMIN, S/P g/dL 4.1 3.9 4.0  BUN mg/dL 35* 35* 33*  CREATININE mg/dL 1.98* 2.08* 1.82*  EGFRNAFR mL/min/1.64m 34* 32* 38*  EGFRAFR mL/min/1.748m39* 37* 44   Assessment: Encounter Diagnoses  Name Primary?   Special screening for malignant neoplasms, colon Yes   Persistent atrial fibrillation (HCC)    Stage 3b chronic kidney disease (HCC)    OSA (obstructive sleep apnea)    Long term (current) use of antithrombotics/antiplatelets     Average risk colorectal cancer, he is appropriate for screening. We discussed options of Cologuard versus colonoscopy along with pros cons and risk benefits of both. His sedation risk is higher than average due to his heart condition, and his post polypectomy bleeding risk is increased due to Xarelto (even with a 36 to 48-hour plan hold before.)  His cardiac and renal conditions have been stable over this last year.  After thoughtful consideration and multiple questions regarding the technical aspects of colonoscopy, all of which were answered, he decided to have a colonoscopy.   The benefits and risks of the planned procedure were described in detail with the patient or (when appropriate) their health care proxy.  Risks were outlined as including, but not limited to, bleeding, infection, perforation, adverse medication reaction leading to cardiac or pulmonary decompensation, pancreatitis (if ERCP).  The limitation of incomplete mucosal visualization was also discussed.  No guarantees or warranties were  given.   Plan: Procedure was scheduled, and I elected to use a split dose 4 L PEG prep to reduce the chance of volume or electrolyte changes with his cardiac and renal condition.  We will communicate with Dr. CrSallyanne Kusteregarding a planned hold of Xarelto 2 days prior (due to his CKD).   Thank you for the courtesy of this consult.  Please call me with any questions or concerns. (Extensive chart review required due to this patient's medical complexity) HeNelida MeuseII  CC: Referring provider noted above

## 2021-02-27 NOTE — Telephone Encounter (Signed)
Patient with diagnosis of afib on Eliquis for anticoagulation.    Procedure: colonoscopy Date of procedure: 03/10/21  CHA2DS2-VASc Score = 5  This indicates a 7.2% annual risk of stroke. The patient's score is based upon: CHF History: 1 HTN History: 1 Diabetes History: 1 Stroke History: 0 Vascular Disease History: 1 Age Score: 1 Gender Score: 0  CrCl 59mL/min Platelet count 197K  Per office protocol, patient can hold Eliquis for 2 days prior to procedure as requested.

## 2021-02-27 NOTE — Telephone Encounter (Addendum)
Patient has been notified and aware . He will hold the Eliquis 2 days prior to his scheduled colonoscopy

## 2021-02-27 NOTE — Telephone Encounter (Signed)
Clinical pharmacist to review Eliquis.  Patient has a history of atrial flutter and A. fib status post redo ablation in 2021, tachycardia mediated cardiomyopathy, combined systolic and diastolic heart failure, moderate AI, obstructive sleep apnea, hypertension, hyperlipidemia, DM2, CKD stage III and obesity. CHA2DS2-Vasc 4 (age >78, HTN, DM II, CHF). Last Cr 1.87, follows Dr. Juleen China of nephrology in [place].

## 2021-03-02 ENCOUNTER — Ambulatory Visit (INDEPENDENT_AMBULATORY_CARE_PROVIDER_SITE_OTHER): Payer: Medicare HMO | Admitting: Podiatry

## 2021-03-02 ENCOUNTER — Telehealth: Payer: Self-pay

## 2021-03-02 ENCOUNTER — Other Ambulatory Visit (HOSPITAL_COMMUNITY): Payer: Self-pay

## 2021-03-02 ENCOUNTER — Other Ambulatory Visit: Payer: Self-pay

## 2021-03-02 DIAGNOSIS — E1142 Type 2 diabetes mellitus with diabetic polyneuropathy: Secondary | ICD-10-CM

## 2021-03-02 DIAGNOSIS — D649 Anemia, unspecified: Secondary | ICD-10-CM

## 2021-03-02 DIAGNOSIS — M1A9XX0 Chronic gout, unspecified, without tophus (tophi): Secondary | ICD-10-CM

## 2021-03-02 DIAGNOSIS — M2042 Other hammer toe(s) (acquired), left foot: Secondary | ICD-10-CM

## 2021-03-02 DIAGNOSIS — E1169 Type 2 diabetes mellitus with other specified complication: Secondary | ICD-10-CM

## 2021-03-02 DIAGNOSIS — I10 Essential (primary) hypertension: Secondary | ICD-10-CM

## 2021-03-02 DIAGNOSIS — E782 Mixed hyperlipidemia: Secondary | ICD-10-CM

## 2021-03-02 DIAGNOSIS — E669 Obesity, unspecified: Secondary | ICD-10-CM

## 2021-03-02 DIAGNOSIS — M2041 Other hammer toe(s) (acquired), right foot: Secondary | ICD-10-CM

## 2021-03-02 NOTE — Telephone Encounter (Signed)
Called pt to set -up a lab only appointment.and let him know that he would have to endocrinology for dexcom

## 2021-03-02 NOTE — Progress Notes (Signed)
Patient presented for foam casting for 3 pair custom diabetic shoe inserts. Patient is measured with a [place] Device to be a size 12 wide  Diabetic shoes are chosen from the [employer] catalog and shoes are chosen are first choice is MW 813 White and the second choice is X 821  The patient will be contacted when the shoes and inserts are ready to be picked up

## 2021-03-02 NOTE — Telephone Encounter (Signed)
See phone call encounter.

## 2021-03-03 ENCOUNTER — Encounter: Payer: Self-pay | Admitting: Gastroenterology

## 2021-03-04 ENCOUNTER — Telehealth: Payer: Self-pay | Admitting: Cardiovascular Disease

## 2021-03-04 NOTE — Telephone Encounter (Signed)
Call returned to [facility]. The patient has been approved for Eliquis assistance. They were calling to verify that the medication is 5 mg twice daily with a 90 day supply and 3 refills. This has been verified.

## 2021-03-04 NOTE — Telephone Encounter (Signed)
[name] from [facility] [phone] is calling to verify how this pt is supposed to be taking the medicine Eliquis 50 mg tablet

## 2021-03-06 ENCOUNTER — Other Ambulatory Visit (INDEPENDENT_AMBULATORY_CARE_PROVIDER_SITE_OTHER): Payer: Medicare HMO

## 2021-03-06 ENCOUNTER — Other Ambulatory Visit: Payer: Self-pay

## 2021-03-06 DIAGNOSIS — D649 Anemia, unspecified: Secondary | ICD-10-CM

## 2021-03-06 DIAGNOSIS — E1169 Type 2 diabetes mellitus with other specified complication: Secondary | ICD-10-CM

## 2021-03-06 DIAGNOSIS — E782 Mixed hyperlipidemia: Secondary | ICD-10-CM

## 2021-03-06 DIAGNOSIS — M1A9XX0 Chronic gout, unspecified, without tophus (tophi): Secondary | ICD-10-CM | POA: Diagnosis not present

## 2021-03-06 DIAGNOSIS — E669 Obesity, unspecified: Secondary | ICD-10-CM | POA: Diagnosis not present

## 2021-03-06 DIAGNOSIS — I10 Essential (primary) hypertension: Secondary | ICD-10-CM

## 2021-03-06 LAB — CBC WITH DIFFERENTIAL/PLATELET
Basophils Absolute: 0 10*3/uL (ref 0.0–0.1)
Basophils Relative: 0.6 % (ref 0.0–3.0)
Eosinophils Absolute: 0.2 10*3/uL (ref 0.0–0.7)
Eosinophils Relative: 4.2 % (ref 0.0–5.0)
HCT: 42 % (ref 39.0–52.0)
Hemoglobin: 13.4 g/dL (ref 13.0–17.0)
Lymphocytes Relative: 35.7 % (ref 12.0–46.0)
Lymphs Abs: 1.3 10*3/uL (ref 0.7–4.0)
MCHC: 31.9 g/dL (ref 30.0–36.0)
MCV: 81.1 fl (ref 78.0–100.0)
Monocytes Absolute: 0.7 10*3/uL (ref 0.1–1.0)
Monocytes Relative: 17.7 % — ABNORMAL HIGH (ref 3.0–12.0)
Neutro Abs: 1.6 10*3/uL (ref 1.4–7.7)
Neutrophils Relative %: 41.8 % — ABNORMAL LOW (ref 43.0–77.0)
Platelets: 160 10*3/uL (ref 150.0–400.0)
RBC: 5.17 Mil/uL (ref 4.22–5.81)
RDW: 16.3 % — ABNORMAL HIGH (ref 11.5–15.5)
WBC: 3.8 10*3/uL — ABNORMAL LOW (ref 4.0–10.5)

## 2021-03-06 LAB — LIPID PANEL
Cholesterol: 119 mg/dL (ref 0–200)
HDL: 34.6 mg/dL — ABNORMAL LOW (ref >39.00)
LDL Cholesterol: 60 mg/dL (ref 0–99)
NonHDL: 84.1
Total CHOL/HDL Ratio: 3
Triglycerides: 123 mg/dL (ref 0.0–149.0)
VLDL: 24.6 mg/dL (ref 0.0–40.0)

## 2021-03-06 LAB — COMPREHENSIVE METABOLIC PANEL
ALT: 10 U/L (ref 0–53)
AST: 14 U/L (ref 0–37)
Albumin: 3.9 g/dL (ref 3.5–5.2)
Alkaline Phosphatase: 53 U/L (ref 39–117)
BUN: 33 mg/dL — ABNORMAL HIGH (ref 6–23)
CO2: 31 mEq/L (ref 19–32)
Calcium: 9.3 mg/dL (ref 8.4–10.5)
Chloride: 101 mEq/L (ref 96–112)
Creatinine, Ser: 1.53 mg/dL — ABNORMAL HIGH (ref 0.40–1.50)
GFR: 46.54 mL/min — ABNORMAL LOW (ref >60.00)
Glucose, Bld: 115 mg/dL — ABNORMAL HIGH (ref 70–99)
Potassium: 4.2 mEq/L (ref 3.5–5.1)
Sodium: 138 mEq/L (ref 135–145)
Total Bilirubin: 0.6 mg/dL (ref 0.2–1.2)
Total Protein: 6.7 g/dL (ref 6.0–8.3)

## 2021-03-06 LAB — TSH: TSH: 2.28 u[IU]/mL (ref 0.35–5.50)

## 2021-03-06 LAB — URIC ACID: Uric Acid, Serum: 4.4 mg/dL (ref 4.0–7.8)

## 2021-03-06 LAB — HEMOGLOBIN A1C: Hgb A1c MFr Bld: 8.2 % — ABNORMAL HIGH (ref 4.6–6.5)

## 2021-03-10 ENCOUNTER — Other Ambulatory Visit: Payer: Self-pay

## 2021-03-10 ENCOUNTER — Encounter: Payer: Self-pay | Admitting: Gastroenterology

## 2021-03-10 ENCOUNTER — Ambulatory Visit (AMBULATORY_SURGERY_CENTER): Payer: Medicare HMO | Admitting: Gastroenterology

## 2021-03-10 VITALS — BP 112/66 | HR 69 | Temp 97.7°F | Resp 21 | Ht 71.0 in | Wt 188.0 lb

## 2021-03-10 DIAGNOSIS — Z1211 Encounter for screening for malignant neoplasm of colon: Secondary | ICD-10-CM | POA: Diagnosis not present

## 2021-03-10 DIAGNOSIS — D123 Benign neoplasm of transverse colon: Secondary | ICD-10-CM

## 2021-03-10 HISTORY — PX: COLONOSCOPY: SHX174

## 2021-03-10 MED ORDER — SODIUM CHLORIDE 0.9 % IV SOLN: 500.0000 mL | Freq: Once | INTRAVENOUS | Status: DC

## 2021-03-10 NOTE — Progress Notes (Signed)
Called to room to assist during endoscopic procedure.  Patient ID and intended procedure confirmed with present staff. Received instructions for my participation in the procedure from the performing physician.  

## 2021-03-10 NOTE — Progress Notes (Signed)
No changes to clinical history since GI office visit on 02/27/21.  The patient is appropriate for an endoscopic procedure in the ambulatory setting.

## 2021-03-10 NOTE — Patient Instructions (Signed)
You amy resume your Eliquis tomorrow at he previous dose.  Read all of the handouts given to you by your recovery nurse.  The clip placed in your colon will fall off on it's own.  It's important that you do not have an MRI for a month post procedure.  YOU HAD AN ENDOSCOPIC PROCEDURE TODAY AT [facility]:   Refer to the procedure report that was given to you for any specific questions about what was found during the examination.  If the procedure report does not answer your questions, please call your gastroenterologist to clarify.  If you requested that your care partner not be given the details of your procedure findings, then the procedure report has been included in a sealed envelope for you to review at your convenience later.  YOU SHOULD EXPECT: Some feelings of bloating in the abdomen. Passage of more gas than usual.  Walking can help get rid of the air that was put into your GI tract during the procedure and reduce the bloating. If you had a lower endoscopy (such as a colonoscopy or flexible sigmoidoscopy) you may notice spotting of blood in your stool or on the toilet paper. If you underwent a bowel prep for your procedure, you may not have a normal bowel movement for a few days.  Please Note:  You might notice some irritation and congestion in your nose or some drainage.  This is from the oxygen used during your procedure.  There is no need for concern and it should clear up in a day or so.  SYMPTOMS TO REPORT IMMEDIATELY:  Following lower endoscopy (colonoscopy or flexible sigmoidoscopy):  Excessive amounts of blood in the stool  Significant tenderness or worsening of abdominal pains  Swelling of the abdomen that is new, acute  Fever of 100F or higher   For urgent or emergent issues, a gastroenterologist can be reached at any hour by calling [phone]. Do not use MyChart messaging for urgent concerns.    DIET:  We do recommend a small meal at first, but then you  may proceed to your regular diet.  Drink plenty of fluids but you should avoid alcoholic beverages for 24 hours.  ACTIVITY:  You should plan to take it easy for the rest of today and you should NOT DRIVE or use heavy machinery until tomorrow (because of the sedation medicines used during the test).    FOLLOW UP: Our staff will call the number listed on your records 48-72 hours following your procedure to check on you and address any questions or concerns that you may have regarding the information given to you following your procedure. If we do not reach you, we will leave a message.  We will attempt to reach you two times.  During this call, we will ask if you have developed any symptoms of COVID 19. If you develop any symptoms (ie: fever, flu-like symptoms, shortness of breath, cough etc.) before then, please call [phone].  If you test positive for Covid 19 in the 2 weeks post procedure, please call and report this information to [place].    If any biopsies were taken you will be contacted by phone or by letter within the next 1-3 weeks.  Please call us at [phone] if you have not heard about the biopsies in 3 weeks.    SIGNATURES/CONFIDENTIALITY: You and/or your care partner have signed paperwork which will be entered into your electronic medical record.  These signatures attest to the  fact that that the information above on your After Visit Summary has been reviewed and is understood.  Full responsibility of the confidentiality of this discharge information lies with you and/or your care-partner.

## 2021-03-10 NOTE — Op Note (Signed)
[facility] Patient Name: [name] Procedure Date: 03/10/2021 1:47 PM MRN: [id number] Endoscopist: [place]. [name] , MD Age: 70 Referring MD:  Date of Birth: 01/16/1951 Gender: Male Account #: [id number] Procedure:                Colonoscopy Indications:              Screening for colorectal malignant neoplasm, This                            is the patient's first colonoscopy Medicines:                Monitored Anesthesia Care Procedure:                Pre-Anesthesia Assessment:                           - Prior to the procedure, a History and Physical                            was performed, and patient medications and                            allergies were reviewed. The patient's tolerance of                            previous anesthesia was also reviewed. The risks                            and benefits of the procedure and the sedation                            options and risks were discussed with the patient.                            All questions were answered, and informed consent                            was obtained. Prior Anticoagulants: The patient has                            taken Eliquis (apixaban), last dose was 2 days                            prior to procedure. ASA Grade Assessment: III - A                            patient with severe systemic disease. After                            reviewing the risks and benefits, the patient was                            deemed in satisfactory condition to undergo the  procedure.                           After obtaining informed consent, the colonoscope                            was passed under direct vision. Throughout the                            procedure, the patient's blood pressure, pulse, and                            oxygen saturations were monitored continuously. The                            Colonoscope was introduced through the anus and                             advanced to the the cecum, identified by                            appendiceal orifice and ileocecal valve. The                            colonoscopy was performed without difficulty. The                            patient tolerated the procedure well. The quality                            of the bowel preparation was good. The ileocecal                            valve, appendiceal orifice, and rectum were                            photographed. The bowel preparation used was                            GoLYTELY. Scope In: 1:55:30 PM Scope Out: 2:23:20 PM Scope Withdrawal Time: 0 hours 26 minutes 13 seconds  Total Procedure Duration: 0 hours 27 minutes 50 seconds  Findings:                 The perianal and digital rectal examinations were                            normal.                           Multiple diverticula were found in the entire colon.                           A 15-18 mm polyp was found in the proximal  transverse colon. The polyp was sessile. The polyp                            was removed with a piecemeal technique using a hot                            snare. Resection and retrieval were complete                            (additional STSC at edge). Area was tattooed with                            an injection of 0.5 mL of Spot (carbon black). To                            prevent bleeding post-intervention, one hemostatic                            clip was successfully placed (MR conditional).                           The exam was otherwise without abnormality on                            direct and retroflexion views. Complications:            No immediate complications. Estimated Blood Loss:     Estimated blood loss was minimal. Impression:               - Diverticulosis in the entire examined colon.                           - One 15-18 mm polyp in the proximal transverse                            colon, removed  piecemeal using a hot snare.                            Resected and retrieved. Tattooed. Clip (MR                            conditional) was placed.                           - The examination was otherwise normal on direct                            and retroflexion views. Recommendation:           - Patient has a contact number available for                            emergencies. The signs and symptoms of potential  delayed complications were discussed with the                            patient. Return to normal activities tomorrow.                            Written discharge instructions were provided to the                            patient.                           - Resume previous diet.                           - Continue present medications.                           - Resume Eliquis (apixaban) at prior dose tomorrow.                           - Await pathology results.                           - Repeat colonoscopy is recommended for                            surveillance. The colonoscopy date will be                            determined after pathology results from today's                            exam become available for review. [name] L. Kevin Carrow, MD 03/10/2021 2:32:04 PM This report has been signed electronically.

## 2021-03-10 NOTE — Progress Notes (Signed)
PT taken to PACU. Monitors in place. VSS. Report given to RN. 

## 2021-03-10 NOTE — Progress Notes (Signed)
VS- Nash Mantis  Cell phone off per pt   Pt has a link recorder in place

## 2021-03-11 DIAGNOSIS — H40013 Open angle with borderline findings, low risk, bilateral: Secondary | ICD-10-CM | POA: Diagnosis not present

## 2021-03-11 LAB — HM DIABETES EYE EXAM

## 2021-03-12 ENCOUNTER — Telehealth: Payer: Self-pay

## 2021-03-12 ENCOUNTER — Other Ambulatory Visit: Payer: Self-pay | Admitting: Family Medicine

## 2021-03-12 NOTE — Telephone Encounter (Signed)
The patient has been approved for both [place] and Eliquis through the end of the year

## 2021-03-12 NOTE — Telephone Encounter (Signed)
Pt is following up on meds assistance. Please advise pt further [phone]

## 2021-03-12 NOTE — Telephone Encounter (Signed)
  Follow up Call-  Call back number 03/10/2021  Post procedure Call Back phone  # [phone]  Permission to leave phone message Yes  Some recent data might be hidden     Patient questions:  Do you have a fever, pain , or abdominal swelling? No. Pain Score  0 *  Have you tolerated food without any problems? Yes.    Have you been able to return to your normal activities? Yes.    Do you have any questions about your discharge instructions: Diet   No. Medications  No. Follow up visit  No.  Do you have questions or concerns about your Care? No.  Actions: * If pain score is 4 or above: No action needed, pain <4. Have you developed a fever since your procedure? no  2.   Have you had an respiratory symptoms (SOB or cough) since your procedure? no  3.   Have you tested positive for COVID 19 since your procedure no  4.   Have you had any family members/close contacts diagnosed with the COVID 19 since your procedure?  no   If yes to any of these questions please route to Joylene John, RN and Joella Prince, RN

## 2021-03-12 NOTE — Telephone Encounter (Signed)
Requesting: temazepam 30mg   Contract: 10/21/2017 UDS: 08/03/2018 Last Visit: 09/11/2020 Next Visit: 06/26/2021  Last Refill: 02/10/2021 #30 and 0RF  Please Advise

## 2021-03-12 NOTE — Telephone Encounter (Signed)
Spoke to patient-patient aware he was approved for patient assistance for [place] and Eliquis.    He states he has received Eliquis in the mail but has not received [place].  He tried to call [facility] [employer] [place] and was hold on for 1 hour, unable to speak to representative.  He only has 5 pills left.   Attempt to call [employer]-on hold for 30 mins. Unable to speak to representative. Will continue to attempt to follow up.  Provided samples of Farxiga to patient, aware he can pick up at front desk.

## 2021-03-13 ENCOUNTER — Encounter: Payer: Self-pay | Admitting: Gastroenterology

## 2021-03-17 ENCOUNTER — Telehealth: Payer: Self-pay

## 2021-03-17 DIAGNOSIS — Z006 Encounter for examination for normal comparison and control in clinical research program: Secondary | ICD-10-CM

## 2021-03-17 NOTE — Research (Addendum)
Alleviate HF   [facility] Transmission      Very low burden of paroxysmal AFib/flutter/PAT (0.1% in last month).

## 2021-03-17 NOTE — Telephone Encounter (Signed)
Inbound fax for therapeutic shoes has been signed and faxed back over along with 02/24/2021 office notes Fax number: [phone]

## 2021-03-20 NOTE — Telephone Encounter (Signed)
Will notify when medication is received.

## 2021-03-20 NOTE — Telephone Encounter (Signed)
Patient called to advise that he received a letter that was approved for [facility] and that his medicine was being shipped to the office - letter date 03/05/2021

## 2021-03-22 ENCOUNTER — Other Ambulatory Visit: Payer: Self-pay | Admitting: Cardiology

## 2021-03-24 ENCOUNTER — Telehealth: Payer: Self-pay

## 2021-03-24 NOTE — Telephone Encounter (Signed)
Called pt and informed patient assistance ozempic is ready for pickup 5 boxes.

## 2021-03-25 NOTE — Telephone Encounter (Signed)
Pt picked up Ozpemic

## 2021-03-30 ENCOUNTER — Other Ambulatory Visit: Payer: Self-pay

## 2021-03-30 ENCOUNTER — Encounter: Payer: Self-pay | Admitting: Podiatry

## 2021-03-30 ENCOUNTER — Ambulatory Visit: Payer: Medicare HMO | Admitting: Podiatry

## 2021-03-30 DIAGNOSIS — B351 Tinea unguium: Secondary | ICD-10-CM

## 2021-03-30 DIAGNOSIS — M79675 Pain in left toe(s): Secondary | ICD-10-CM | POA: Diagnosis not present

## 2021-03-30 DIAGNOSIS — E1142 Type 2 diabetes mellitus with diabetic polyneuropathy: Secondary | ICD-10-CM | POA: Diagnosis not present

## 2021-03-30 DIAGNOSIS — M79674 Pain in right toe(s): Secondary | ICD-10-CM | POA: Diagnosis not present

## 2021-04-05 ENCOUNTER — Encounter: Payer: Self-pay | Admitting: Podiatry

## 2021-04-05 NOTE — Progress Notes (Signed)
  Subjective:  Patient ID: [name], male    DOB: May 30, 1951,  MRN: [id number]  70 y.o. male presents with at risk foot care with history of diabetic neuropathy and painful thick toenails that are difficult to trim. Pain interferes with ambulation. Aggravating factors include wearing enclosed shoe gear. Pain is relieved with periodic professional debridement.  He has diabetic neuropathy which is managed with gabapentin.  Patient's blood sugar was 111 mg/dl today.    He has been measured for his diabetic shoes.  Review of Systems: Negative except as noted in the HPI.   Allergies  Allergen Reactions   Sulfonamide Derivatives Other (See Comments)    Flu like symptoms    Zolpidem Other (See Comments)    Excessive, prolonged sedation    Objective:  There were no vitals filed for this visit. Constitutional Patient is a pleasant 70 y.o. African American male WD, WN in NAD. AAO x 3.  Vascular Capillary fill time to digits immediate b/l.  DP/PT pulse(s) are palpable b/l lower extremities. Pedal hair sparse. Lower extremity skin temperature gradient within normal limits. No pain with calf compression b/l. No edema noted b/l lower extremities. No cyanosis or clubbing noted.   Neurologic Protective sensation intact 5/5 intact bilaterally with 10g monofilament b/l. Vibratory sensation intact b/l. No clonus b/l. Pt has subjective symptoms of neuropathy.  Dermatologic Pedal skin is warm and supple b/l.  No open wounds b/l lower extremities. No interdigital macerations b/l lower extremities. Toenails 1-5 b/l elongated, discolored, dystrophic, thickened, crumbly with subungual debris and tenderness to dorsal palpation.   Orthopedic: Normal muscle strength 5/5 to all lower extremity muscle groups bilaterally. Patient ambulates independent of any assistive aids. Hammertoe deformity noted 2-5 b/l.   Hemoglobin A1C Latest Ref Rng & Units 03/06/2021 02/24/2021 10/21/2020 07/22/2020 04/18/2020  HGBA1C 4.6 -  6.5 % 8.2(H) 7.4(A) 6.9(A) 6.3(A) 8.1(A)  Some recent data might be hidden    Assessment:   1. Pain due to onychomycosis of toenails of both feet   2. Diabetic peripheral neuropathy associated with type 2 diabetes mellitus ([place])    Plan:  Patient was evaluated and treated and all questions answered. Consent given for treatment as described below: -No new findings. No new orders. -Continue diabetic foot care principles: inspect feet daily, monitor glucose as recommended by PCP and/or Endocrinologist, and follow prescribed diet per PCP, Endocrinologist and/or dietician. -Toenails 1-5 left and 1-5 right debrided in length and girth without iatrogenic bleeding with sterile nail nipper and dremel.  -Patient/POA to call should there be question/concern in the interim.  Return in about 3 months (around 06/30/2021).  Marzetta Board, DPM

## 2021-04-13 ENCOUNTER — Other Ambulatory Visit: Payer: Self-pay | Admitting: Family Medicine

## 2021-04-14 ENCOUNTER — Telehealth: Payer: Self-pay | Admitting: Family Medicine

## 2021-04-14 ENCOUNTER — Other Ambulatory Visit: Payer: Self-pay | Admitting: Family Medicine

## 2021-04-14 NOTE — Telephone Encounter (Signed)
Form dropped off to be filled out Call patient when ready to be picked up

## 2021-04-15 NOTE — Telephone Encounter (Signed)
Placed in yellow bin

## 2021-04-16 ENCOUNTER — Other Ambulatory Visit: Payer: Self-pay | Admitting: Cardiology

## 2021-04-16 ENCOUNTER — Other Ambulatory Visit (HOSPITAL_COMMUNITY): Payer: Self-pay | Admitting: Internal Medicine

## 2021-04-16 NOTE — Telephone Encounter (Signed)
Upfront waiting for signature and copy

## 2021-04-22 ENCOUNTER — Other Ambulatory Visit: Payer: Self-pay | Admitting: Family Medicine

## 2021-04-22 ENCOUNTER — Telehealth: Payer: Self-pay | Admitting: Cardiovascular Disease

## 2021-04-22 NOTE — Telephone Encounter (Signed)
Pt states he received the pt assistance paperwork in the mail for [name], he wants to know if he should complete the paperwork

## 2021-04-23 NOTE — Telephone Encounter (Signed)
The patient will bring the paperwork to the office to be signed and faxed.

## 2021-04-24 ENCOUNTER — Telehealth: Payer: Self-pay | Admitting: *Deleted

## 2021-04-24 NOTE — Telephone Encounter (Signed)
Eliquis assistance has been faxed to [facility]

## 2021-05-12 ENCOUNTER — Other Ambulatory Visit: Payer: Self-pay | Admitting: Family Medicine

## 2021-05-12 NOTE — Telephone Encounter (Signed)
Requesting: temazepam 30mg  Contract: 10/21/2017 UDS: 08/03/2018 Last Visit: 09/11/2020 Next Visit: 07/06/2021 Last Refill: 04/15/2021 #30 and 0RF  Please Advise

## 2021-05-13 VITALS — BP 107/63 | HR 72 | Resp 14 | Wt 190.0 lb

## 2021-05-13 DIAGNOSIS — Z006 Encounter for examination for normal comparison and control in clinical research program: Secondary | ICD-10-CM

## 2021-05-13 NOTE — Research (Addendum)
Alleviate HF Research Study  24 Month follow-up  Patient doing well at this time. No adverse events or medication changes to report.  [id number] completed: See worksheet  Doing well. NYHA functional class I. Clinically euvolemic.    Current Outpatient Medications:    Accu-Chek Softclix Lancets lancets, Use as instructed, Disp: 200 each, Rfl: 3   albuterol (VENTOLIN HFA) 108 (90 Base) MCG/ACT inhaler, Inhale 2 puffs into the lungs every 6 (six) hours as needed for wheezing or shortness of breath., Disp: 1 Inhaler, Rfl: 6   Alcohol Swabs (DROPSAFE ALCOHOL PREP) 70 % PADS, USE AS DIRECTED, Disp: 300 each, Rfl: 1   allopurinol (ZYLOPRIM) 300 MG tablet, Take 1 tablet by mouth once daily, Disp: 90 tablet, Rfl: 1   apixaban (ELIQUIS) 5 MG TABS tablet, Take 1 tablet by mouth twice daily, Disp: 180 tablet, Rfl: 1   Blood Glucose Monitoring Suppl (ACCU-CHEK GUIDE ME) w/Device KIT, Use as instructed to check blood sugar 1-2 times daily., Disp: 1 kit, Rfl: 0   calcium carbonate (TUMS - DOSED IN MG ELEMENTAL CALCIUM) 500 MG chewable tablet, Chew 500 mg by mouth daily as needed for indigestion or heartburn., Disp: , Rfl:    cetirizine (ZYRTEC) 10 MG tablet, Take 1 tablet (10 mg total) by mouth daily., Disp: 30 tablet, Rfl: 11   clobetasol ointment (TEMOVATE) 3.00 %, 1 application to affected area, Disp: , Rfl:    Clobetasol Prop Emollient Base (CLOBETASOL PROPIONATE E) 0.05 % emollient cream, Apply 1 application topically 2 (two) times daily as needed (insect bite)., Disp: 60 g, Rfl: 1   colchicine (COLCRYS) 0.6 MG tablet, Take one tablet for 4-6 days as needed for gout flare up., Disp: 15 tablet, Rfl: 3   dapagliflozin propanediol (FARXIGA) 10 MG TABS tablet, TAKE 1 TABLET BY MOUTH ONCE DAILY, Disp: 90 tablet, Rfl: 3   fish oil-omega-3 fatty acids 1000 MG capsule, Take 1 g by mouth daily., Disp: , Rfl:    furosemide (LASIX) 80 MG tablet, Take 1 tablet (80 mg total) by mouth daily. As needed patient may take  an additional 40 MG tablet as direct per Alleviate Research study PRN plan., Disp: 95 tablet, Rfl: 3   gabapentin (NEURONTIN) 100 MG capsule, Take 2 capsules (200 mg total) by mouth at bedtime., Disp: 180 capsule, Rfl: 3   glucose blood (ACCU-CHEK GUIDE) test strip, Use as instructed to check blood sugar 1-2 times daily, Disp: 200 each, Rfl: 3   glyBURIDE (DIABETA) 5 MG tablet, Take 1/2 tablet by mouth before dinner, Disp: 90 tablet, Rfl: 1   hydrALAZINE (APRESOLINE) 25 MG tablet, TAKE 1 TABLET BY MOUTH THREE TIMES DAILY, Disp: 270 tablet, Rfl: 3   HYDROcodone-acetaminophen (NORCO) 10-325 MG tablet, Take 1 tablet by mouth every 8 (eight) hours as needed for moderate pain or severe pain., Disp: 90 tablet, Rfl: 0   hydrocortisone 2.5 % ointment, Apply topically 2 (two) times daily as needed., Disp: 30 g, Rfl: 1   isosorbide mononitrate (IMDUR) 30 MG 24 hr tablet, TAKE 1 TABLET BY MOUTH ONCE DAILY . APPOINTMENT REQUIRED FOR FUTURE REFILLS, Disp: 30 tablet, Rfl: 1   LORazepam (ATIVAN) 1 MG tablet, TAKE 1 TABLET BY MOUTH EVERY 8 HOURS AS NEEDED FOR ANXIETY, Disp: 30 tablet, Rfl: 2   magnesium oxide (MAG-OX) 400 MG tablet, Take 1 tablet (400 mg total) by mouth 2 (two) times daily., Disp: 30 tablet, Rfl: 11   metoprolol succinate (TOPROL-XL) 50 MG 24 hr tablet, TAKE 1 TABLET BY MOUTH  TWICE DAILY WITH A MEAL OR  IMMEDIATELY  FOLLOWING  A  MEAL, Disp: 180 tablet, Rfl: 2   Multiple Vitamin (MULTIVITAMIN WITH MINERALS) TABS tablet, Take 1 tablet by mouth daily. , Disp: , Rfl:    naproxen (NAPROSYN) 375 MG tablet, Take 375 mg by mouth 2 (two) times daily as needed (knee pain.). , Disp: , Rfl:    Polyethyl Glycol-Propyl Glycol (SYSTANE OP), Place 1 drop into both eyes daily as needed (dry eyes)., Disp: , Rfl:    potassium chloride SA (KLOR-CON M20) 20 MEQ tablet, Take 1 tablet (20 mEq total) by mouth as directed. 1 tablet daily 20 mEq by mouth as directed per alleviate research protocol., Disp: 90 tablet, Rfl: 3    pravastatin (PRAVACHOL) 40 MG tablet, Take 1 tablet by mouth once daily, Disp: 90 tablet, Rfl: 3   tamsulosin (FLOMAX) 0.4 MG CAPS capsule, Take 1 capsule by mouth once daily, Disp: 30 capsule, Rfl: 0   temazepam (RESTORIL) 30 MG capsule, Take 1 capsule by mouth at bedtime, Disp: 30 capsule, Rfl: 0   white petrolatum (VASELINE) GEL, See admin instructions., Disp: , Rfl:    Semaglutide,0.25 or 0.5MG /DOS, (OZEMPIC, 0.25 OR 0.5 MG/DOSE,) 2 MG/1.5ML SOPN, Inject 0.5 mg into the skin once a week., Disp: 4.5 mL, Rfl: 3

## 2021-05-14 LAB — BASIC METABOLIC PANEL
BUN/Creatinine Ratio: 18 (ref 10–24)
BUN: 28 mg/dL — ABNORMAL HIGH (ref 8–27)
CO2: 25 mmol/L (ref 20–29)
Calcium: 9.9 mg/dL (ref 8.6–10.2)
Chloride: 100 mmol/L (ref 96–106)
Creatinine, Ser: 1.6 mg/dL — ABNORMAL HIGH (ref 0.76–1.27)
Glucose: 144 mg/dL — ABNORMAL HIGH (ref 70–99)
Potassium: 4.1 mmol/L (ref 3.5–5.2)
Sodium: 141 mmol/L (ref 134–144)
eGFR: 47 mL/min/{1.73_m2} — ABNORMAL LOW (ref >59)

## 2021-05-14 LAB — CBC
Hematocrit: 46 % (ref 37.5–51.0)
Hemoglobin: 14.4 g/dL (ref 13.0–17.7)
MCH: 26.1 pg — ABNORMAL LOW (ref 26.6–33.0)
MCHC: 31.3 g/dL — ABNORMAL LOW (ref 31.5–35.7)
MCV: 84 fL (ref 79–97)
Platelets: 171 10*3/uL (ref 150–450)
RBC: 5.51 x10E6/uL (ref 4.14–5.80)
RDW: 14.5 % (ref 11.6–15.4)
WBC: 3.7 10*3/uL (ref 3.4–10.8)

## 2021-05-14 LAB — BRAIN NATRIURETIC PEPTIDE: BNP: 121.7 pg/mL — ABNORMAL HIGH (ref 0.0–100.0)

## 2021-05-17 ENCOUNTER — Other Ambulatory Visit: Payer: Self-pay | Admitting: Family Medicine

## 2021-05-18 NOTE — Telephone Encounter (Signed)
Requesting:ativan Contract: unknown  UDS: unknown Last Visit:09/11/20 Next Visit:07/06/21 Last Refill: 09/08/20  Please Advise

## 2021-06-12 ENCOUNTER — Ambulatory Visit: Payer: Medicare HMO

## 2021-06-12 ENCOUNTER — Other Ambulatory Visit: Payer: Self-pay | Admitting: Family Medicine

## 2021-06-12 NOTE — Telephone Encounter (Signed)
Requesting: temazepam 30mg  Contract: 10/21/2017 UDS: 08/03/2018 Last Visit: 09/11/2020 Next Visit: 07/06/2021 Last Refill: 05/12/2021 #30 and 0RF  Please Advise

## 2021-06-17 ENCOUNTER — Other Ambulatory Visit: Payer: Self-pay

## 2021-06-17 ENCOUNTER — Ambulatory Visit: Payer: Medicare HMO

## 2021-06-17 DIAGNOSIS — E1142 Type 2 diabetes mellitus with diabetic polyneuropathy: Secondary | ICD-10-CM

## 2021-06-17 DIAGNOSIS — M2041 Other hammer toe(s) (acquired), right foot: Secondary | ICD-10-CM | POA: Diagnosis not present

## 2021-06-17 DIAGNOSIS — M2042 Other hammer toe(s) (acquired), left foot: Secondary | ICD-10-CM | POA: Diagnosis not present

## 2021-06-17 NOTE — Progress Notes (Signed)
SITUATION Reason for Visit: Fitting of Diabetic Shoes & Insoles Patient / Caregiver Report:  Patient is satisfied with shoes  OBJECTIVE DATA: Patient History / Diagnosis:     ICD-10-CM   1. Diabetic peripheral neuropathy associated with type 2 diabetes mellitus (HCC)  E11.42       Change in Status:   None  ACTIONS PERFORMED: In-Person Delivery, patient was fit with: - 1x pair [id number] PDAC approved prefabricated Diabetic Shoes: [employer] - 3x pair [phone] PDAC approved CAM milled custom diabetic insoles  Shoes and insoles were verified for structural integrity and safety. Patient wore shoes and insoles in office. Skin was inspected and free of areas of concern after wearing shoes and inserts. Shoes and inserts fit properly. Patient / Caregiver provided with ferbal instruction and demonstration regarding donning, doffing, wear, care, proper fit, function, purpose, cleaning, and use of shoes and insoles ' and in all related precautions and risks and benefits regarding shoes and insoles. Patient / Caregiver was instructed to wear properly fitting socks with shoes at all times. Patient was also provided with verbal instruction regarding how to report any failures or malfunctions of shoes or inserts, and necessary follow up care. Patient / Caregiver was also instructed to contact physician regarding change in status that may affect function of shoes and inserts.   Patient / Caregiver verbalized undersatnding of instruction provided. Patient / Caregiver demonstrated independence with proper donning and doffing of shoes and inserts.  PLAN Patient to follow up as needed. Plan of care was discussed with and agreed upon by patient and/or caregiver. All questions were answered and concerns addressed.

## 2021-06-18 ENCOUNTER — Other Ambulatory Visit (HOSPITAL_COMMUNITY): Payer: Self-pay | Admitting: Internal Medicine

## 2021-06-23 MED ORDER — FUROSEMIDE 80 MG PO TABS: ORAL_TABLET | ORAL | 3 refills | Status: DC

## 2021-06-23 NOTE — Addendum Note (Signed)
Addended by: Rubie Maid B on: 06/23/2021 02:24 PM   Modules accepted: Orders

## 2021-06-30 ENCOUNTER — Ambulatory Visit: Payer: Medicare HMO | Admitting: Internal Medicine

## 2021-06-30 ENCOUNTER — Encounter: Payer: Self-pay | Admitting: Internal Medicine

## 2021-06-30 ENCOUNTER — Other Ambulatory Visit: Payer: Self-pay

## 2021-06-30 VITALS — BP 110/68 | HR 85 | Ht 71.0 in | Wt 192.0 lb

## 2021-06-30 DIAGNOSIS — E782 Mixed hyperlipidemia: Secondary | ICD-10-CM | POA: Diagnosis not present

## 2021-06-30 DIAGNOSIS — E663 Overweight: Secondary | ICD-10-CM | POA: Diagnosis not present

## 2021-06-30 DIAGNOSIS — E1169 Type 2 diabetes mellitus with other specified complication: Secondary | ICD-10-CM

## 2021-06-30 DIAGNOSIS — E669 Obesity, unspecified: Secondary | ICD-10-CM | POA: Diagnosis not present

## 2021-06-30 LAB — POCT GLYCOSYLATED HEMOGLOBIN (HGB A1C): Hemoglobin A1C: 5.9 % — AB (ref 4.0–5.6)

## 2021-06-30 NOTE — Patient Instructions (Addendum)
Please continue: - Farxiga 10 mg daily - Ozempic 0.5 mg weekly  Please come back for a follow-up appointment in 4 months.

## 2021-06-30 NOTE — Progress Notes (Signed)
Patient ID: KENDALE REMBOLD, male   DOB: 10/16/1950, 71 y.o.   MRN: [id number]  This visit occurred during the SARS-CoV-2 public health emergency.  Safety protocols were in place, including screening questions prior to the visit, additional usage of staff PPE, and extensive cleaning of exam room while observing appropriate contact time as indicated for disinfecting solutions.   HPI: IANMICHAEL AMESCUA is a 71 y.o.-year-old male, initially referred by his cardiologist, Dr. Sallyanne Kuster, returning for follow-up DM2, dx in 2011, non-insulin-dependent, uncontrolled, with complications (CHF, CKD, ED).  Last visit 4 months ago.  Interim hx: No increased urination, blurry vision, nausea, chest pain. He started to eliminate animal protein before last OV. He also joined the [employer].  He has no SEs from Ozempic -which he was able to start after last visit.  Reviewed HbA1c levels Lab Results  Component Value Date   HGBA1C 8.2 (H) 03/06/2021   HGBA1C 7.4 (A) 02/24/2021   HGBA1C 6.9 (A) 10/21/2020   HGBA1C 6.3 (A) 07/22/2020   HGBA1C 8.1 (A) 04/18/2020   HGBA1C 6.6 (A) 12/14/2019   HGBA1C 5.3 09/14/2019   HGBA1C 6.4 06/11/2019   HGBA1C 5.8 02/06/2019   HGBA1C 5.7 08/03/2018   HGBA1C 7.0 (H) 05/02/2018   HGBA1C 6.7 (H) 01/24/2018   HGBA1C 6.2 10/21/2017   HGBA1C 7.2 (H) 03/18/2017   HGBA1C 7.0 (H) 11/15/2016   HGBA1C 7.5 (H) 08/20/2016   HGBA1C 6.8 (H) 05/21/2016   HGBA1C 6.7 (H) 02/18/2016   HGBA1C 6.4 11/17/2015   HGBA1C 6.4 07/15/2015   HGBA1C 6.6 (H) 04/14/2015   HGBA1C 6.1 01/07/2015   HGBA1C 6.8 (H) 09/13/2014   HGBA1C 6.8 (H) 06/13/2014   HGBA1C 6.6 (H) 01/09/2014   HGBA1C 6.4 (H) 10/08/2013   HGBA1C 7.0 (H) 07/11/2013   HGBA1C 6.6 (H) 01/31/2013   HGBA1C 5.9 (H) 10/31/2012   HGBA1C 6.8 (H) 06/22/2012   Pt is on a regimen of: - Farxiga 10 mg daily-started by Dr. Haroldine Laws - Ozempic 0.25 >> 0.5 mg weekly - started 03/29/2021 - through [facility] Stopped Glyburide 02/2021.  He was not able to  start Ozempic 0.5 mg- 04/2020 - due to price. He was on Metformin in the past.  Pt checks his sugars once a day: - am: 95-142, 154, 170 >> 97- 179 >> 144-171 >> 104-128, 134 - 2h after b'fast: n/c - before lunch: n/c - 2h after lunch: n/c >> 165 >> n/c >> 224 - before dinner: 170 >> 109, 118 >> 81-133, 235 >> 178-227 >> n/c - 2h after dinner: n/c >> 142-175 >> 109, 122, 144 >> n/c >> 107-161 - bedtime: 68-155, 176, 376  >> 154, 157, 200 >> n/c - nighttime: n/c Lowest sugar was 60 >> 100 >> 130 >> 144 104; he has hypoglycemia awareness in the 50s.  Highest sugar was 376 (cheese cake) >>... 199 >> 175 >> 227 >> 160s.  Glucometer: One Touch verio IQ  Pt's meals are: - Breakfast: oatmeal + berries/apples + cinnamon, cereal, fruit - Lunch: light meal - Dinner:  + veggies, occasionally fried foods (1x a mo) - Snacks: cranberry juice, grape juice, aple juice >> diluted by at least 50%.  -+ CKD-sees Dr. Juleen China, last BUN/creatinine:  Lab Results  Component Value Date   BUN 28 (H) 05/13/2021   BUN 33 (H) 03/06/2021   CREATININE 1.60 (H) 05/13/2021   CREATININE 1.53 (H) 03/06/2021   Lab Results  Component Value Date   GFRAA 42 (L) 05/20/2020   GFRAA 38 (L) 02/25/2020  GFRAA 36 (L) 11/26/2019   GFRAA 39 (L) 11/12/2019   GFRAA 36 (L) 09/18/2019   GFRAA 37 (L) 08/30/2019   GFRAA 39 (L) 08/14/2019   GFRAA 45 (L) 07/11/2019   GFRAA 51 (L) 05/29/2019   GFRAA 58 (L) 05/28/2019  He is not on ACE inhibitor/ARB.  -+ HL; last set of lipids: Lab Results  Component Value Date   CHOL 119 03/06/2021   HDL 34.60 (L) 03/06/2021   LDLCALC 60 03/06/2021   LDLDIRECT 84.0 05/02/2018   TRIG 123.0 03/06/2021   CHOLHDL 3 03/06/2021  On pravastatin 40, omega-3 fatty acids.  - last eye exam was 09/03/2020: Reportedly no DR, + glaucoma. Dr. Manuella Ghazi.  -+ numbness and tingling in his feet.Dr. Sallyanne Kuster started him on Gabapentin 100 mg daily.  Currently on 100-200 milligrams daily.  Pt has FH of DM  in mother, sister.  He is on amiodarone.  No FH of MTC. No personal hx of pancreatitis.  ROS: + see HPI Neurological: + tremors/+ numbness/no tingling/no dizziness  I reviewed pt's medications, allergies, PMH, social hx, family hx, and changes were documented in the history of present illness. Otherwise, unchanged from my initial visit note.  Past Medical History:  Diagnosis Date   AORTIC STENOSIS, MODERATE 12/05/2009   Arthritis    Atrial fibrillation (HCC)    Atrial fibrillation with RVR ([place]) 07/05/2017   Atrial flutter ([place]) 12/05/2009   Atypical chest pain 11/08/2011   BENIGN PROSTATIC HYPERTROPHY, HX OF 12/05/2009   CHEST PAIN-UNSPECIFIED 11/11/2009   CHF 12/05/2009   Chronic renal insufficiency 12/16/2016   Debility 04/18/2012   Diarrhea 12/16/2016   DYSPNEA 12/19/2009   Edema 04/18/2012   ERECTILE DYSFUNCTION 01/25/2007   FATIGUE, CHRONIC 11/11/2009   Gout 07/10/2012   Heart murmur    History of kidney stones    "they passed" (07/05/2017)   Hyperkalemia 04/18/2012   Hyperlipidemia    Hypertension    HYPERTENSION 01/25/2007   Hypoxia 05/05/2011   Insomnia 05/13/2016   INSOMNIA, HX OF 01/25/2007   Knee pain, bilateral 12/28/2010   Knee pain, right 12/28/2010   NEUROMA 01/05/2010   OBESITY NOS 01/25/2007   OSA on CPAP 04/06/2010   PERIPHERAL NEUROPATHY 01/05/2010   PULMONARY FUNCTION TESTS, ABNORMAL 02/02/2010   Sleep apnea    wears CPAP   Superficial thrombophlebitis of left leg 09/07/2011   TESTICULAR HYPOFUNCTION 01/05/2010   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   Toe pain, right 07/10/2012   Type II diabetes mellitus ([place]) 12/05/2009   UNSPECIFIED ANEMIA 01/05/2010   Past Surgical History:  Procedure Laterality Date   A-FLUTTER ABLATION N/A 05/16/2019   Procedure: A-FLUTTER ABLATION;  Surgeon: Constance Haw, MD;  Location: [place] [facility];  Service: Cardiovascular;  Laterality: N/A;   ATRIAL FIBRILLATION ABLATION N/A 05/16/2019   Procedure: ATRIAL  FIBRILLATION ABLATION;  Surgeon: Constance Haw, MD;  Location: [place] [facility];  Service: Cardiovascular;  Laterality: N/A;   ATRIAL FIBRILLATION ABLATION N/A 03/10/2020   Procedure: ATRIAL FIBRILLATION ABLATION;  Surgeon: Constance Haw, MD;  Location: [place] [facility];  Service: Cardiovascular;  Laterality: N/A;   CARDIAC CATHETERIZATION  10/16/2009   nonischemic cardiomyopathy   CARDIOVERSION N/A 03/25/2015   Procedure: CARDIOVERSION;  Surgeon: Pixie Casino, MD;  Location: [facility];  Service: Cardiovascular;  Laterality: N/A;   CARDIOVERSION N/A 06/17/2017   Procedure: CARDIOVERSION;  Surgeon: Sanda Klein, MD;  Location: [facility];  Service: Cardiovascular;  Laterality: N/A;   CARDIOVERSION N/A 07/08/2017   Procedure:  CARDIOVERSION;  Surgeon: Fay Records, MD;  Location: [facility];  Service: Cardiovascular;  Laterality: N/A;   CARDIOVERSION N/A 02/27/2019   Procedure: CARDIOVERSION;  Surgeon: Sanda Klein, MD;  Location: [place] [facility];  Service: Cardiovascular;  Laterality: N/A;   CARDIOVERSION N/A 08/16/2019   Procedure: CARDIOVERSION;  Surgeon: Sanda Klein, MD;  Location: [facility];  Service: Cardiovascular;  Laterality: N/A;   COLONOSCOPY  03/10/2021   1st colonoscopy , Wilfrid Lund, [facility], large transverse polyp, clip and spot applied   METATARSAL OSTEOTOMY Bilateral ~ 1980   removed part of 5th metatarsal to corect curvature of toes    RIGHT HEART CATH N/A 05/16/2019   Procedure: RIGHT HEART CATH;  Surgeon: Jolaine Artist, MD;  Location: [place] [facility];  Service: Cardiovascular;  Laterality: N/A;   TEE WITHOUT CARDIOVERSION  05/16/2019   Procedure: Transesophageal Echocardiogram (Tee);  Surgeon: Constance Haw, MD;  Location: [place] [facility];  Service: Cardiovascular;;   TEE WITHOUT CARDIOVERSION N/A 03/07/2020   Procedure: TRANSESOPHAGEAL ECHOCARDIOGRAM (TEE);  Surgeon: Freada Bergeron, MD;  Location: [facility];   Service: Cardiovascular;  Laterality: N/A;   Social History   Socioeconomic History   Marital status: Married    Spouse name: Not on file   Number of children: 4   Years of education: 12   Highest education level: High school graduate  Occupational History   Occupation: retired    Fish farm manager: [employer]  Tobacco Use   Smoking status: Former    Packs/day: 1.00    Years: 10.00    Pack years: 10.00    Types: Cigarettes    Quit date: 06/07/1980    Years since quitting: 41.0   Smokeless tobacco: Never  Vaping Use   Vaping Use: Never used  Substance and Sexual Activity   Alcohol use: No   Drug use: No   Sexual activity: Not Currently  Other Topics Concern   Not on file  Social History Narrative   Lives with male partner in a one story home.  His son lives there off and on.  Retired from [employer].  Education: high school.  Right handed   Social Determinants of Health   Financial Resource Strain: Not on file  Food Insecurity: Not on file  Transportation Needs: Not on file  Physical Activity: Not on file  Stress: Not on file  Social Connections: Not on file  Intimate Partner Violence: Not on file   Current Outpatient Medications on File Prior to Visit  Medication Sig Dispense Refill   furosemide (LASIX) 80 MG tablet Take 1 tablet (80 mg total) by mouth daily. As needed patient may take an additional 40 MG tablet as direct per [employer] study PRN plan. 90 tablet 3   LORazepam (ATIVAN) 1 MG tablet TAKE 1 TABLET BY MOUTH EVERY 8 HOURS AS NEEDED FOR ANXIETY 30 tablet 0   Accu-Chek Softclix Lancets lancets Use as instructed 200 each 3   albuterol (VENTOLIN HFA) 108 (90 Base) MCG/ACT inhaler Inhale 2 puffs into the lungs every 6 (six) hours as needed for wheezing or shortness of breath. 1 Inhaler 6   Alcohol Swabs (DROPSAFE ALCOHOL PREP) 70 % PADS USE AS DIRECTED 300 each 1   allopurinol (ZYLOPRIM) 300 MG tablet Take 1 tablet by mouth once daily 90 tablet 1   apixaban  (ELIQUIS) 5 MG TABS tablet Take 1 tablet by mouth twice daily 180 tablet 1   Blood Glucose Monitoring Suppl (ACCU-CHEK GUIDE ME) w/Device KIT Use  as instructed to check blood sugar 1-2 times daily. 1 kit 0   calcium carbonate (TUMS - DOSED IN MG ELEMENTAL CALCIUM) 500 MG chewable tablet Chew 500 mg by mouth daily as needed for indigestion or heartburn.     cetirizine (ZYRTEC) 10 MG tablet Take 1 tablet (10 mg total) by mouth daily. 30 tablet 11   clobetasol ointment (TEMOVATE) 5.63 % 1 application to affected area     Clobetasol Prop Emollient Base (CLOBETASOL PROPIONATE E) 0.05 % emollient cream Apply 1 application topically 2 (two) times daily as needed (insect bite). 60 g 1   colchicine (COLCRYS) 0.6 MG tablet Take one tablet for 4-6 days as needed for gout flare up. 15 tablet 3   dapagliflozin propanediol (FARXIGA) 10 MG TABS tablet TAKE 1 TABLET BY MOUTH ONCE DAILY 90 tablet 3   fish oil-omega-3 fatty acids 1000 MG capsule Take 1 g by mouth daily.     gabapentin (NEURONTIN) 100 MG capsule Take 2 capsules (200 mg total) by mouth at bedtime. 180 capsule 3   glucose blood (ACCU-CHEK GUIDE) test strip Use as instructed to check blood sugar 1-2 times daily 200 each 3   glyBURIDE (DIABETA) 5 MG tablet Take 1/2 tablet by mouth before dinner 90 tablet 1   hydrALAZINE (APRESOLINE) 25 MG tablet TAKE 1 TABLET BY MOUTH THREE TIMES DAILY 270 tablet 3   HYDROcodone-acetaminophen (NORCO) 10-325 MG tablet Take 1 tablet by mouth every 8 (eight) hours as needed for moderate pain or severe pain. 90 tablet 0   hydrocortisone 2.5 % ointment Apply topically 2 (two) times daily as needed. 30 g 1   isosorbide mononitrate (IMDUR) 30 MG 24 hr tablet Take 1 tablet (30 mg total) by mouth daily. 30 tablet 6   magnesium oxide (MAG-OX) 400 MG tablet Take 1 tablet (400 mg total) by mouth 2 (two) times daily. 30 tablet 11   metoprolol succinate (TOPROL-XL) 50 MG 24 hr tablet TAKE 1 TABLET BY MOUTH TWICE DAILY WITH A MEAL OR   IMMEDIATELY  FOLLOWING  A  MEAL 180 tablet 2   Multiple Vitamin (MULTIVITAMIN WITH MINERALS) TABS tablet Take 1 tablet by mouth daily.      naproxen (NAPROSYN) 375 MG tablet Take 375 mg by mouth 2 (two) times daily as needed (knee pain.).      Polyethyl Glycol-Propyl Glycol (SYSTANE OP) Place 1 drop into both eyes daily as needed (dry eyes).     potassium chloride SA (KLOR-CON M20) 20 MEQ tablet Take 1 tablet (20 mEq total) by mouth as directed. 1 tablet daily 20 mEq by mouth as directed per alleviate research protocol. 90 tablet 3   pravastatin (PRAVACHOL) 40 MG tablet Take 1 tablet by mouth once daily 90 tablet 3   Semaglutide,0.25 or 0.5MG/DOS, (OZEMPIC, 0.25 OR 0.5 MG/DOSE,) 2 MG/1.5ML SOPN Inject 0.5 mg into the skin once a week. 4.5 mL 3   tamsulosin (FLOMAX) 0.4 MG CAPS capsule Take 1 capsule by mouth once daily 30 capsule 0   temazepam (RESTORIL) 30 MG capsule Take 1 capsule by mouth at bedtime 30 capsule 0   white petrolatum (VASELINE) GEL See admin instructions.     No current facility-administered medications on file prior to visit.   Allergies  Allergen Reactions   Sulfonamide Derivatives Other (See Comments)    Flu like symptoms    Zolpidem Other (See Comments)    Excessive, prolonged sedation   Family History  Problem Relation Age of Onset   Clotting disorder  Mother    Heart disease Mother        s/p MI   Heart attack Mother    Hypertension Mother    Diabetes Mother    Hyperlipidemia Mother    Stroke Mother    Cancer Father        ? lung   Lung disease Father        smoker   Diabetes Sister    Hypertension Sister        smoker   Aneurysm Sister        brain   Other Sister        clipped   Seizures Sister        d/o w/aneurysm/ smoker   Leukemia Maternal Grandmother        ?   Colon cancer Neg Hx    Colon polyps Neg Hx    Stomach cancer Neg Hx    Rectal cancer Neg Hx    PE: BP 110/68 (BP Location: Right Arm, Patient Position: Sitting, Cuff Size: Normal)     Pulse 85    Ht 5' 11" (1.803 m)    Wt 192 lb (87.1 kg)    SpO2 96%    BMI 26.78 kg/m  Wt Readings from Last 3 Encounters:  06/30/21 192 lb (87.1 kg)  05/13/21 190 lb (86.2 kg)  03/10/21 188 lb (85.3 kg)   Constitutional: overweight, in NAD Eyes: PERRLA, EOMI, no exophthalmos ENT: moist mucous membranes, no thyromegaly, no cervical lymphadenopathy Cardiovascular: RRR, No MRG Respiratory: CTA B Musculoskeletal: no deformities, strength intact in all 4 Skin: moist, warm, no rashes Neurological: no tremor with outstretched hands, DTR normal in all 4  ASSESSMENT: 1. DM2, non-insulin-dependent, uncontrolled, with long-term complications - s and d CHF - A fib - s/p cardioversion - 05/2019 >> went into cardiac shock - CKD - sees nephrology  2.  Overweight  3. HL  4.  Peripheral neuropathy  PLAN:  1. Patient with longstanding, previously uncontrolled type 2 diabetes, on SGLT2 inhibitor and sulfonylurea at last visit.  We stopped sulfonylurea and switched to GLP-1 receptor agonist.  He was able to start Ozempic approximately 1 month after her last appointment.  Sugars increase significantly.  He also continues to go to the gym and continuing with a mostly whole food plant-based diet. -At this visit, reviewing his blood sugars at home, they are almost all at goal.  He does not have any side effects from [place]. -I strongly encouraged him to continue the same regimen - I suggested to:  Patient Instructions  Please continue: - Farxiga 10 mg daily - Ozempic 0.5 mg weekly  Please come back for a follow-up appointment in 4 months.  - we checked his HbA1c: 5.9% (much improved) - advised to check sugars at different times of the day - 1x a day, rotating check times - advised for yearly eye exams >> he is UTD - return to clinic in 4 months  2.  Overweight -continue SGLT 2 inhibitor and GLP-1 receptor agonist which should also help with weight loss -He lost approximately 5 pounds  pounds before last visit, but gained 4 pounds since then  3. HL -Reviewed latest lipid panel from 02/2021: LDL at goal, HDL slightly low: Lab Results  Component Value Date   CHOL 119 03/06/2021   HDL 34.60 (L) 03/06/2021   LDLCALC 60 03/06/2021   LDLDIRECT 84.0 05/02/2018   TRIG 123.0 03/06/2021   CHOLHDL 3 03/06/2021  -He continues on pravastatin 40  mg daily and omega-3 fatty acids without side effects  4.  Peripheral neuropathy -Most likely related to diabetes -On gabapentin 100-200 mg at bedtime, initially added by Dr. Sallyanne Kuster -Numbness and tingling still persist -I sent a prescription for diabetic shoes before this visit  Philemon Kingdom, MD PhD [facility] Endocrinology

## 2021-06-30 NOTE — Addendum Note (Signed)
Addended by: Lauralyn Primes on: 06/30/2021 11:03 AM   Modules accepted: Orders

## 2021-07-05 ENCOUNTER — Other Ambulatory Visit: Payer: Self-pay | Admitting: Cardiovascular Disease

## 2021-07-06 ENCOUNTER — Ambulatory Visit: Payer: Medicare HMO | Admitting: Family Medicine

## 2021-07-07 ENCOUNTER — Other Ambulatory Visit: Payer: Self-pay

## 2021-07-07 ENCOUNTER — Ambulatory Visit (INDEPENDENT_AMBULATORY_CARE_PROVIDER_SITE_OTHER): Payer: Medicare HMO | Admitting: Family Medicine

## 2021-07-07 ENCOUNTER — Ambulatory Visit (HOSPITAL_COMMUNITY)
Admission: RE | Admit: 2021-07-07 | Discharge: 2021-07-07 | Disposition: A | Discharge status: Home / Self Care | Payer: Medicare HMO | Source: Ambulatory Visit | Attending: Internal Medicine | Admitting: Internal Medicine

## 2021-07-07 ENCOUNTER — Encounter: Payer: Self-pay | Admitting: Family Medicine

## 2021-07-07 ENCOUNTER — Encounter (HOSPITAL_COMMUNITY): Payer: Self-pay | Admitting: Internal Medicine

## 2021-07-07 VITALS — BP 120/60 | HR 68 | Wt 193.2 lb

## 2021-07-07 VITALS — BP 112/58 | HR 91 | Temp 97.9°F | Resp 16 | Ht 71.0 in | Wt 192.6 lb

## 2021-07-07 DIAGNOSIS — E1142 Type 2 diabetes mellitus with diabetic polyneuropathy: Secondary | ICD-10-CM | POA: Insufficient documentation

## 2021-07-07 DIAGNOSIS — Z79899 Other long term (current) drug therapy: Secondary | ICD-10-CM

## 2021-07-07 DIAGNOSIS — Z7901 Long term (current) use of anticoagulants: Secondary | ICD-10-CM | POA: Insufficient documentation

## 2021-07-07 DIAGNOSIS — N1832 Chronic kidney disease, stage 3b: Secondary | ICD-10-CM | POA: Diagnosis not present

## 2021-07-07 DIAGNOSIS — I10 Essential (primary) hypertension: Secondary | ICD-10-CM | POA: Diagnosis not present

## 2021-07-07 DIAGNOSIS — G4733 Obstructive sleep apnea (adult) (pediatric): Secondary | ICD-10-CM | POA: Insufficient documentation

## 2021-07-07 DIAGNOSIS — E1122 Type 2 diabetes mellitus with diabetic chronic kidney disease: Secondary | ICD-10-CM | POA: Diagnosis not present

## 2021-07-07 DIAGNOSIS — Z23 Encounter for immunization: Secondary | ICD-10-CM

## 2021-07-07 DIAGNOSIS — I5022 Chronic systolic (congestive) heart failure: Secondary | ICD-10-CM | POA: Diagnosis not present

## 2021-07-07 DIAGNOSIS — G4700 Insomnia, unspecified: Secondary | ICD-10-CM

## 2021-07-07 DIAGNOSIS — I129 Hypertensive chronic kidney disease with stage 1 through stage 4 chronic kidney disease, or unspecified chronic kidney disease: Secondary | ICD-10-CM | POA: Diagnosis not present

## 2021-07-07 DIAGNOSIS — E782 Mixed hyperlipidemia: Secondary | ICD-10-CM

## 2021-07-07 DIAGNOSIS — I351 Nonrheumatic aortic (valve) insufficiency: Secondary | ICD-10-CM | POA: Diagnosis not present

## 2021-07-07 DIAGNOSIS — I480 Paroxysmal atrial fibrillation: Secondary | ICD-10-CM

## 2021-07-07 DIAGNOSIS — E669 Obesity, unspecified: Secondary | ICD-10-CM

## 2021-07-07 DIAGNOSIS — Z9989 Dependence on other enabling machines and devices: Secondary | ICD-10-CM | POA: Diagnosis not present

## 2021-07-07 DIAGNOSIS — I5082 Biventricular heart failure: Secondary | ICD-10-CM | POA: Insufficient documentation

## 2021-07-07 DIAGNOSIS — D649 Anemia, unspecified: Secondary | ICD-10-CM | POA: Diagnosis not present

## 2021-07-07 DIAGNOSIS — I130 Hypertensive heart and chronic kidney disease with heart failure and stage 1 through stage 4 chronic kidney disease, or unspecified chronic kidney disease: Secondary | ICD-10-CM | POA: Insufficient documentation

## 2021-07-07 DIAGNOSIS — R000 Tachycardia, unspecified: Secondary | ICD-10-CM | POA: Diagnosis not present

## 2021-07-07 DIAGNOSIS — Z833 Family history of diabetes mellitus: Secondary | ICD-10-CM | POA: Insufficient documentation

## 2021-07-07 DIAGNOSIS — F411 Generalized anxiety disorder: Secondary | ICD-10-CM

## 2021-07-07 DIAGNOSIS — I428 Other cardiomyopathies: Secondary | ICD-10-CM | POA: Diagnosis not present

## 2021-07-07 DIAGNOSIS — Z8249 Family history of ischemic heart disease and other diseases of the circulatory system: Secondary | ICD-10-CM | POA: Insufficient documentation

## 2021-07-07 DIAGNOSIS — E1169 Type 2 diabetes mellitus with other specified complication: Secondary | ICD-10-CM

## 2021-07-07 DIAGNOSIS — I4819 Other persistent atrial fibrillation: Secondary | ICD-10-CM | POA: Insufficient documentation

## 2021-07-07 DIAGNOSIS — Z7984 Long term (current) use of oral hypoglycemic drugs: Secondary | ICD-10-CM | POA: Insufficient documentation

## 2021-07-07 DIAGNOSIS — M25551 Pain in right hip: Secondary | ICD-10-CM | POA: Diagnosis not present

## 2021-07-07 LAB — COMPREHENSIVE METABOLIC PANEL
ALT: 13 U/L (ref 0–53)
AST: 16 U/L (ref 0–37)
Albumin: 4.4 g/dL (ref 3.5–5.2)
Alkaline Phosphatase: 45 U/L (ref 39–117)
BUN: 31 mg/dL — ABNORMAL HIGH (ref 6–23)
CO2: 34 mEq/L — ABNORMAL HIGH (ref 19–32)
Calcium: 10 mg/dL (ref 8.4–10.5)
Chloride: 102 mEq/L (ref 96–112)
Creatinine, Ser: 1.8 mg/dL — ABNORMAL HIGH (ref 0.40–1.50)
GFR: 38.2 mL/min — ABNORMAL LOW (ref >60.00)
Glucose, Bld: 111 mg/dL — ABNORMAL HIGH (ref 70–99)
Potassium: 4.6 mEq/L (ref 3.5–5.1)
Sodium: 142 mEq/L (ref 135–145)
Total Bilirubin: 0.6 mg/dL (ref 0.2–1.2)
Total Protein: 7.4 g/dL (ref 6.0–8.3)

## 2021-07-07 LAB — VITAMIN D 25 HYDROXY (VIT D DEFICIENCY, FRACTURES): VITD: 32.93 ng/mL (ref 30.00–100.00)

## 2021-07-07 LAB — TSH: TSH: 2.16 u[IU]/mL (ref 0.35–5.50)

## 2021-07-07 MED ORDER — LORAZEPAM 1 MG PO TABS: 1.0000 mg | ORAL_TABLET | Freq: Three times a day (TID) | ORAL | 5 refills | Status: DC | PRN

## 2021-07-07 MED ORDER — TEMAZEPAM 30 MG PO CAPS: 30.0000 mg | ORAL_CAPSULE | Freq: Every day | ORAL | 5 refills | Status: DC

## 2021-07-07 MED ORDER — TIZANIDINE HCL 2 MG PO TABS: 1.0000 mg | ORAL_TABLET | Freq: Every evening | ORAL | 1 refills | Status: DC | PRN

## 2021-07-07 NOTE — Addendum Note (Signed)
Encounter addended by: Jerl Mina, RN on: 07/07/2021 2:40 PM  Actions taken: Order list changed, Diagnosis association updated, Clinical Note Signed

## 2021-07-07 NOTE — Progress Notes (Addendum)
Advanced Heart Failure Clinic Note   Referring Physician: PCP: [name], MD PCP-Cardiologist: Sanda Klein, MD  [facility]: Dr. 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   Admitted 05/16/19 for PVI with successful procedure. Post procedure he developed cardiogenic shock and respiratory failure. CCM consulted for intubation. Advanced HF team consulted. As he improved he was extubated.  Treated with pressors and gradually weaned off. HF meds initiated but unable to treat w/ ARB/ARNI nor spiro due to AKI, SCr peaking to 2.2. Tolerated Bidil and digoxin. ECHO initially showed EF 10% but improved 35-40% prior to discharge. Was discharged home on 05/29/19.  He went back in A fib 08/02/19. Saw pulmonary on 08/02/19 and was noted to have many central events. CPAP was to be changed to auto titration.  On 08/16/19 he had successful cardioversion for afib.   We have not seem him since 7/21 Had Repeat PVI in 10/21. Has remained in NSR  Follows with Dr. Bonne Dolores   He presents back for f/u. Remains in Alleviate study (HF remote ILR device). Feels great. Remains active. No CP, SOB, orthopnea, PND or edema. SBP at home 95-110.   [name] Past Medical History:  Diagnosis Date   AORTIC STENOSIS, MODERATE 12/05/2009   Arthritis    Atrial fibrillation (HCC)    Atrial fibrillation with RVR ([place]) 07/05/2017   Atrial flutter ([place]) 12/05/2009   Atypical chest pain 11/08/2011   BENIGN PROSTATIC HYPERTROPHY, HX OF 12/05/2009   CHEST PAIN-UNSPECIFIED 11/11/2009   CHF 12/05/2009   Chronic renal insufficiency 12/16/2016   Debility 04/18/2012   Diarrhea 12/16/2016   DYSPNEA 12/19/2009   Edema 04/18/2012   ERECTILE DYSFUNCTION 01/25/2007   FATIGUE, CHRONIC 11/11/2009   Gout 07/10/2012   Heart murmur    History of kidney stones    "they passed" (07/05/2017)   Hyperkalemia 04/18/2012   Hyperlipidemia    Hypertension     HYPERTENSION 01/25/2007   Hypoxia 05/05/2011   Insomnia 05/13/2016   INSOMNIA, HX OF 01/25/2007   Knee pain, bilateral 12/28/2010   Knee pain, right 12/28/2010   NEUROMA 01/05/2010   OBESITY NOS 01/25/2007   OSA on CPAP 04/06/2010   PERIPHERAL NEUROPATHY 01/05/2010   PULMONARY FUNCTION TESTS, ABNORMAL 02/02/2010   Sleep apnea    wears CPAP   Superficial thrombophlebitis of left leg 09/07/2011   TESTICULAR HYPOFUNCTION 01/05/2010   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 1 Inhaler 6   Alcohol Swabs (DROPSAFE ALCOHOL PREP) 70 % PADS USE AS DIRECTED 300 each 1   allopurinol (ZYLOPRIM) 300 MG tablet Take 1 tablet by mouth once daily 90 tablet 1   apixaban (ELIQUIS) 5 MG TABS tablet Take 1 tablet by mouth twice daily 180 tablet 1   Blood Glucose Monitoring Suppl (ACCU-CHEK GUIDE ME) w/Device KIT Use as instructed to check blood sugar 1-2 times daily. 1 kit 0   calcium carbonate (TUMS - DOSED IN MG ELEMENTAL CALCIUM) 500 MG chewable tablet Chew 500 mg by mouth daily as  needed for indigestion or heartburn.     cetirizine (ZYRTEC) 10 MG tablet Take 1 tablet (10 mg total) by mouth daily. 30 tablet 11   clobetasol ointment (TEMOVATE) 8.18 % 1 application to affected area     Clobetasol Prop Emollient Base (CLOBETASOL PROPIONATE E) 0.05 % emollient cream Apply 1 application topically 2 (two) times daily as needed (insect bite). 60 g 1   colchicine (COLCRYS) 0.6 MG tablet Take one tablet for 4-6 days as needed for gout flare up. 15 tablet 3   dapagliflozin propanediol (FARXIGA) 10 MG TABS tablet TAKE 1 TABLET BY MOUTH ONCE  DAILY 90 tablet 3   fish oil-omega-3 fatty acids 1000 MG capsule Take 1 g by mouth daily.     furosemide (LASIX) 80 MG tablet Take 1 tablet (80 mg total) by mouth daily. As needed patient may take an additional 40 MG tablet as direct per Alleviate Research study PRN plan. 90 tablet 3   gabapentin (NEURONTIN) 100 MG capsule Take 2 capsules (200 mg total) by mouth at bedtime. 180 capsule 3   glucose blood (ACCU-CHEK GUIDE) test strip Use as instructed to check blood sugar 1-2 times daily 200 each 3   hydrALAZINE (APRESOLINE) 25 MG tablet TAKE 1 TABLET BY MOUTH THREE TIMES DAILY 270 tablet 3   HYDROcodone-acetaminophen (NORCO) 10-325 MG tablet Take 1 tablet by mouth every 8 (eight) hours as needed for moderate pain or severe pain. 90 tablet 0   hydrocortisone 2.5 % ointment Apply topically 2 (two) times daily as needed. 30 g 1   isosorbide mononitrate (IMDUR) 30 MG 24 hr tablet Take 1 tablet (30 mg total) by mouth daily. 30 tablet 6   LORazepam (ATIVAN) 1 MG tablet Take 1 tablet (1 mg total) by mouth every 8 (eight) hours as needed. for anxiety 30 tablet 5   magnesium oxide (MAG-OX) 400 MG tablet Take 1 tablet (400 mg total) by mouth 2 (two) times daily. 30 tablet 11   metoprolol succinate (TOPROL-XL) 50 MG 24 hr tablet TAKE 1 TABLET BY MOUTH TWICE DAILY WITH A MEAL OR  IMMEDIATELY  FOLLOWING  A  MEAL 180 tablet 2   Multiple Vitamin (MULTIVITAMIN WITH MINERALS) TABS tablet Take 1 tablet by mouth daily.      naproxen (NAPROSYN) 375 MG tablet Take 375 mg by mouth 2 (two) times daily as needed (knee pain.).      Polyethyl Glycol-Propyl Glycol (SYSTANE OP) Place 1 drop into both eyes daily as needed (dry eyes).     potassium chloride SA (KLOR-CON M20) 20 MEQ tablet Take 1 tablet (20 mEq total) by mouth as directed. 1 tablet daily 20 mEq by mouth as directed per alleviate research protocol. 90 tablet 3   pravastatin (PRAVACHOL) 40 MG tablet Take 1 tablet by mouth once daily 90 tablet 3   Semaglutide,0.25 or  0.5MG/DOS, (OZEMPIC, 0.25 OR 0.5 MG/DOSE,) 2 MG/1.5ML SOPN Inject 0.5 mg into the skin once a week. 4.5 mL 3   tamsulosin (FLOMAX) 0.4 MG CAPS capsule Take 1 capsule by mouth once daily 30 capsule 0   temazepam (RESTORIL) 30 MG capsule Take 1 capsule (30 mg total) by mouth at bedtime. 30 capsule 5   tiZANidine (ZANAFLEX) 2 MG tablet Take 0.5-1 tablets (1-2 mg total) by mouth at bedtime as needed for muscle spasms. 3020 tablet 1   white petrolatum (VASELINE) GEL See admin instructions.     No current facility-administered medications for this encounter.    Allergies  Allergen Reactions  Sulfonamide Derivatives Other (See Comments)    Flu like symptoms    Zolpidem Other (See Comments)    Excessive, prolonged sedation      Social History   Socioeconomic History   Marital status: Married    Spouse name: Not on file   Number of children: 4   Years of education: 12   Highest education level: High school graduate  Occupational History   Occupation: retired    Fish farm manager: [employer]  Tobacco Use   Smoking status: Former    Packs/day: 1.00    Years: 10.00    Pack years: 10.00    Types: Cigarettes    Quit date: 06/07/1980    Years since quitting: 41.1   Smokeless tobacco: Never  Vaping Use   Vaping Use: Never used  Substance and Sexual Activity   Alcohol use: No   Drug use: No   Sexual activity: Not Currently  Other Topics Concern   Not on file  Social History Narrative   Lives with male partner in a one story home.  His son lives there off and on.  Retired from [employer].  Education: high school.  Right handed   Social Determinants of Health   Financial Resource Strain: Not on file  Food Insecurity: Not on file  Transportation Needs: Not on file  Physical Activity: Not on file  Stress: Not on file  Social Connections: Not on file  Intimate Partner Violence: Not on file      Family History  Problem Relation Age of Onset   Clotting disorder Mother    Heart disease  Mother        s/p MI   Heart attack Mother    Hypertension Mother    Diabetes Mother    Hyperlipidemia Mother    Stroke Mother    Cancer Father        ? lung   Lung disease Father        smoker   Diabetes Sister    Hypertension Sister        smoker   Aneurysm Sister        brain   Other Sister        clipped   Seizures Sister        d/o w/aneurysm/ smoker   Leukemia Maternal Grandmother        ?   Colon cancer Neg Hx    Colon polyps Neg Hx    Stomach cancer Neg Hx    Rectal cancer Neg Hx     Vitals:   07/07/21 1408  BP: 120/60  Pulse: 68  SpO2: 98%  Weight: 87.6 kg (193 lb 3.2 oz)     Wt Readings from Last 3 Encounters:  07/07/21 87.6 kg (193 lb 3.2 oz)  07/07/21 87.4 kg (192 lb 9.6 oz)  06/30/21 87.1 kg (192 lb)    PHYSICAL EXAM: General:  Well appearing. No resp difficulty HEENT: normal Neck: supple. no JVD. Carotids 2+ bilat; no bruits. No lymphadenopathy or thryomegaly appreciated. Cor: PMI nondisplaced. Regular rate & rhythm. 2/6 AI Lungs: clear Abdomen: soft, nontender, nondistended. No hepatosplenomegaly. No bruits or masses. Good bowel sounds. Extremities: no cyanosis, clubbing, rash, edema Neuro: alert & orientedx3, cranial nerves grossly intact. moves all 4 extremities w/o difficulty. Affect pleasant   ASSESSMENT & PLAN:  1. 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  EF 10% by TEE 12/9 likely due to tachy CM Initially on pressors after PVI.  - Limited ECHO completed on 05/28/19 and showed EF improving with SR, 40-45%.  - ECHO 07/20/19: EF 35-40%  - TEE 10/21 EF 35-40% - Had Linq  placement. - In ALLEVIATE HF  - Doing great NYHA I - Volume status stable on lasix 80 daily. - GDMT previously limited by CKD however SCr has improved from 2.1 -> 1.5 May be candidate for ARNI/ARB however BP has been too low  - Continue Farxiga 10   - Continue Toprol 8m bid  - Continue Hydralazine 25 tid + Imdur 30 daily.  - Needs repeat echo - recent labs reviewed and looked good    2. Persistent AF - s/p  PVI 12/19. Repeat PVI in 10/21 - Maintaining NSR. Now off amio  - EP following (Dr. CCurt Bears - Continue Eliquis.  - Continue Toprol XL    3. CKD 3a - Scr 2.15 on 09/18/19 - More recently SCr 1.5-1.6 range - Will repeat labs today - [place] in [place] 4. OSA - Compliant w/Bipap  5. DM2 - last HgBa1c 5.9% - Continue Farxiga  6. Aortic insufficiency - moderate - will reassess degree of AI as well as LV size/function on f/u echo   DGlori Bickers MD 07/07/21

## 2021-07-07 NOTE — Patient Instructions (Addendum)
Shingrix is the new shingles shot, 2 shots over 2-6 months, confirm coverage with insurance and document, then can return here for shots with nurse appt or at pharmacy   Hypertension, Adult High blood pressure (hypertension) is when the force of blood pumping through the arteries is too strong. The arteries are the blood vessels that carry blood from the heart throughout the body. Hypertension forces the heart to work harder to pump blood and may cause arteries to become narrow or stiff. Untreated or uncontrolled hypertension can cause a heart attack, heart failure, a stroke, kidney disease, and other problems. A blood pressure reading consists of a higher number over a lower number. Ideally, your blood pressure should be below 120/80. The first ("top") number is called the systolic pressure. It is a measure of the pressure in your arteries as your heart beats. The second ("bottom") number is called the diastolic pressure. It is a measure of the pressure in your arteries as the heart relaxes. What are the causes? The exact cause of this condition is not known. There are some conditions that result in or are related to high blood pressure. What increases the risk? Some risk factors for high blood pressure are under your control. The following factors may make you more likely to develop this condition: Smoking. Having type 2 diabetes mellitus, high cholesterol, or both. Not getting enough exercise or physical activity. Being overweight. Having too much fat, sugar, calories, or salt (sodium) in your diet. Drinking too much alcohol. Some risk factors for high blood pressure may be difficult or impossible to change. Some of these factors include: Having chronic kidney disease. Having a family history of high blood pressure. Age. Risk increases with age. Race. You may be at higher risk if you are African American. Gender. Men are at higher risk than women before age 75. After age 81, women are at  higher risk than men. Having obstructive sleep apnea. Stress. What are the signs or symptoms? High blood pressure may not cause symptoms. Very high blood pressure (hypertensive crisis) may cause: Headache. Anxiety. Shortness of breath. Nosebleed. Nausea and vomiting. Vision changes. Severe chest pain. Seizures. How is this diagnosed? This condition is diagnosed by measuring your blood pressure while you are seated, with your arm resting on a flat surface, your legs uncrossed, and your feet flat on the floor. The cuff of the blood pressure monitor will be placed directly against the skin of your upper arm at the level of your heart. It should be measured at least twice using the same arm. Certain conditions can cause a difference in blood pressure between your right and left arms. Certain factors can cause blood pressure readings to be lower or higher than normal for a short period of time: When your blood pressure is higher when you are in a health care provider's office than when you are at home, this is called white coat hypertension. Most people with this condition do not need medicines. When your blood pressure is higher at home than when you are in a health care provider's office, this is called masked hypertension. Most people with this condition may need medicines to control blood pressure. If you have a high blood pressure reading during one visit or you have normal blood pressure with other risk factors, you may be asked to: Return on a different day to have your blood pressure checked again. Monitor your blood pressure at home for 1 week or longer. If you are diagnosed with hypertension,  you may have other blood or imaging tests to help your health care provider understand your overall risk for other conditions. How is this treated? This condition is treated by making healthy lifestyle changes, such as eating healthy foods, exercising more, and reducing your alcohol intake. Your  health care provider may prescribe medicine if lifestyle changes are not enough to get your blood pressure under control, and if: Your systolic blood pressure is above 130. Your diastolic blood pressure is above 80. Your personal target blood pressure may vary depending on your medical conditions, your age, and other factors. Follow these instructions at home: Eating and drinking  Eat a diet that is high in fiber and potassium, and low in sodium, added sugar, and fat. An example eating plan is called the DASH (Dietary Approaches to Stop Hypertension) diet. To eat this way: Eat plenty of fresh fruits and vegetables. Try to fill one half of your plate at each meal with fruits and vegetables. Eat whole grains, such as whole-wheat pasta, brown rice, or whole-grain bread. Fill about one fourth of your plate with whole grains. Eat or drink low-fat dairy products, such as skim milk or low-fat yogurt. Avoid fatty cuts of meat, processed or cured meats, and poultry with skin. Fill about one fourth of your plate with lean proteins, such as fish, chicken without skin, beans, eggs, or tofu. Avoid pre-made and processed foods. These tend to be higher in sodium, added sugar, and fat. Reduce your daily sodium intake. Most people with hypertension should eat less than 1,500 mg of sodium a day. Do not drink alcohol if: Your health care provider tells you not to drink. You are pregnant, may be pregnant, or are planning to become pregnant. If you drink alcohol: Limit how much you use to: 0-1 drink a day for women. 0-2 drinks a day for men. Be aware of how much alcohol is in your drink. In the [place] one drink equals one 12 oz bottle of beer (355 mL), one 5 oz glass of wine (148 mL), or one 1 oz glass of hard liquor (44 mL). Lifestyle  Work with your health care provider to maintain a healthy body weight or to lose weight. Ask what an ideal weight is for you. Get at least 30 minutes of exercise most days of the  week. Activities may include walking, swimming, or biking. Include exercise to strengthen your muscles (resistance exercise), such as Pilates or lifting weights, as part of your weekly exercise routine. Try to do these types of exercises for 30 minutes at least 3 days a week. Do not use any products that contain nicotine or tobacco, such as cigarettes, e-cigarettes, and chewing tobacco. If you need help quitting, ask your health care provider. Monitor your blood pressure at home as told by your health care provider. Keep all follow-up visits as told by your health care provider. This is important. Medicines Take over-the-counter and prescription medicines only as told by your health care provider. Follow directions carefully. Blood pressure medicines must be taken as prescribed. Do not skip doses of blood pressure medicine. Doing this puts you at risk for problems and can make the medicine less effective. Ask your health care provider about side effects or reactions to medicines that you should watch for. Contact a health care provider if you: Think you are having a reaction to a medicine you are taking. Have headaches that keep coming back (recurring). Feel dizzy. Have swelling in your ankles. Have trouble with your vision.  Get help right away if you: Develop a severe headache or confusion. Have unusual weakness or numbness. Feel faint. Have severe pain in your chest or abdomen. Vomit repeatedly. Have trouble breathing. Summary Hypertension is when the force of blood pumping through your arteries is too strong. If this condition is not controlled, it may put you at risk for serious complications. Your personal target blood pressure may vary depending on your medical conditions, your age, and other factors. For most people, a normal blood pressure is less than 120/80. Hypertension is treated with lifestyle changes, medicines, or a combination of both. Lifestyle changes include losing weight,  eating a healthy, low-sodium diet, exercising more, and limiting alcohol. This information is not intended to replace advice given to you by your health care provider. Make sure you discuss any questions you have with your health care provider. Document Revised: 02/01/2018 Document Reviewed: 02/01/2018 Elsevier Patient Education  [place].

## 2021-07-07 NOTE — Patient Instructions (Signed)
No changes to your medication.  Your physician has requested that you have an echocardiogram. Echocardiography is a painless test that uses sound waves to create images of your heart. It provides your doctor with information about the size and shape of your heart and how well your hearts chambers and valves are working. This procedure takes approximately one hour. There are no restrictions for this procedure.   Your physician recommends that you schedule a follow-up appointment in: 1 year (January 2024) ** Call the office in November 20203 for your appointment**  If you have any questions or concerns before your next appointment please send [place] a message through [place] or call our office at [phone].    TO LEAVE A MESSAGE FOR THE NURSE SELECT OPTION 2, PLEASE LEAVE A MESSAGE INCLUDING: YOUR NAME DATE OF BIRTH CALL BACK NUMBER REASON FOR CALL**this is important as we prioritize the call backs  YOU WILL RECEIVE A CALL BACK THE SAME DAY AS LONG AS YOU CALL BEFORE 4:00 PM  At the [facility], you and your health needs are our priority. As part of our continuing mission to provide you with exceptional heart care, we have created designated Provider Care Teams. These Care Teams include your primary Cardiologist (physician) and Advanced Practice Providers (APPs- Physician Assistants and Nurse Practitioners) who all work together to provide you with the care you need, when you need it.   You may see any of the following providers on your designated Care Team at your next follow up: Dr [name] Dr Haynes Kerns, NP Lyda Jester, [facility] [place], [place] Audry Riles, PharmD   Please be sure to bring in all your medications bottles to every appointment.

## 2021-07-08 ENCOUNTER — Other Ambulatory Visit: Payer: Self-pay | Admitting: Family Medicine

## 2021-07-08 DIAGNOSIS — N289 Disorder of kidney and ureter, unspecified: Secondary | ICD-10-CM

## 2021-07-08 NOTE — Assessment & Plan Note (Signed)
Well controlled, no changes to meds. Encouraged heart healthy diet such as the DASH diet and exercise as tolerated.  °

## 2021-07-08 NOTE — Assessment & Plan Note (Signed)
hgba1c acceptable, minimize simple carbs. Increase exercise as tolerated. Continue current meds 

## 2021-07-08 NOTE — Assessment & Plan Note (Signed)
Encourage heart healthy diet such as MIND or DASH diet, increase exercise, avoid trans fats, simple carbohydrates and processed foods, consider a krill or fish or flaxseed oil cap daily.  °

## 2021-07-08 NOTE — Assessment & Plan Note (Signed)
Encouraged good sleep hygiene such as dark, quiet room. No blue/green glowing lights such as computer screens in bedroom. No alcohol or stimulants in evening. Cut down on caffeine as able. Regular exercise is helpful but not just prior to bed time. Continue current meds

## 2021-07-08 NOTE — Assessment & Plan Note (Signed)
Well controlled, no changes to meds. Encouraged heart healthy diet such as the DASH diet and exercise as tolerated. Renal functions slowly worsening referred back to [facility] for ongoing surveillance

## 2021-07-08 NOTE — Progress Notes (Signed)
Subjective:    Patient ID: [name], male    DOB: [birth year], 71 y.o.   MRN: [id number]  No chief complaint on file.   HPI Patient is in today for follow up on chronic medical concerns. He feels well today. No recent febrile illness or hospitalizations. No acute concerns. He is staying active and eating a heart healthy diet. Denies CP/palp/SOB/HA/congestion/fevers/GI or GU c/o. Taking meds as prescribed   Past Medical History:  Diagnosis Date   AORTIC STENOSIS, MODERATE 12/05/2009   Arthritis    Atrial fibrillation (HCC)    Atrial fibrillation with RVR (HCC) 07/05/2017   Atrial flutter (HCC) 12/05/2009   Atypical chest pain 11/08/2011   BENIGN PROSTATIC HYPERTROPHY, HX OF 12/05/2009   CHEST PAIN-UNSPECIFIED 11/11/2009   CHF 12/05/2009   Chronic renal insufficiency 12/16/2016   Debility 04/18/2012   Diarrhea 12/16/2016   DYSPNEA 12/19/2009   Edema 04/18/2012   ERECTILE DYSFUNCTION 01/25/2007   FATIGUE, CHRONIC 11/11/2009   Gout 07/10/2012   Heart murmur    History of kidney stones    "they passed" (07/05/2017)   Hyperkalemia 04/18/2012   Hyperlipidemia    Hypertension    HYPERTENSION 01/25/2007   Hypoxia 05/05/2011   Insomnia 05/13/2016   INSOMNIA, HX OF 01/25/2007   Knee pain, bilateral 12/28/2010   Knee pain, right 12/28/2010   NEUROMA 01/05/2010   OBESITY NOS 01/25/2007   OSA on CPAP 04/06/2010   PERIPHERAL NEUROPATHY 01/05/2010   PULMONARY FUNCTION TESTS, ABNORMAL 02/02/2010   Sleep apnea    wears CPAP   Superficial thrombophlebitis of left leg 09/07/2011   TESTICULAR HYPOFUNCTION 01/05/2010   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   Toe pain, right 07/10/2012   Type II diabetes mellitus (HCC) 12/05/2009   UNSPECIFIED ANEMIA 01/05/2010    Past Surgical History:  Procedure Laterality Date   A-FLUTTER ABLATION N/A 05/16/2019   Procedure: A-FLUTTER ABLATION;  Surgeon: Regan Lemming, MD;  Location: [facility]ASIVE CV [facility];  Service: Cardiovascular;  Laterality:  N/A;   ATRIAL FIBRILLATION ABLATION N/A 05/16/2019   Procedure: ATRIAL FIBRILLATION ABLATION;  Surgeon: Regan Lemming, MD;  Location: [facility];  Service: Cardiovascular;  Laterality: N/A;   ATRIAL FIBRILLATION ABLATION N/A 03/10/2020   Procedure: ATRIAL FIBRILLATION ABLATION;  Surgeon: Regan Lemming, MD;  Location: [facility];  Service: Cardiovascular;  Laterality: N/A;   CARDIAC CATHETERIZATION  10/16/2009   nonischemic cardiomyopathy   CARDIOVERSION N/A 03/25/2015   Procedure: CARDIOVERSION;  Surgeon: Chrystie Nose, MD;  Location: [facility] ENDOSCOPY;  Service: Cardiovascular;  Laterality: N/A;   CARDIOVERSION N/A 06/17/2017   Procedure: CARDIOVERSION;  Surgeon: Thurmon Fair, MD;  Location: [facility] ENDOSCOPY;  Service: Cardiovascular;  Laterality: N/A;   CARDIOVERSION N/A 07/08/2017   Procedure: CARDIOVERSION;  Surgeon: Pricilla Riffle, MD;  Location: [facility] ENDOSCOPY;  Service: Cardiovascular;  Laterality: N/A;   CARDIOVERSION N/A 02/27/2019   Procedure: CARDIOVERSION;  Surgeon: Thurmon Fair, MD;  Location: [facility] ENDOSCOPY;  Service: Cardiovascular;  Laterality: N/A;   CARDIOVERSION N/A 08/16/2019   Procedure: CARDIOVERSION;  Surgeon: Thurmon Fair, MD;  Location: [facility] ENDOSCOPY;  Service: Cardiovascular;  Laterality: N/A;   COLONOSCOPY  03/10/2021   1st colonoscopy , Amada Jupiter, [facility], large transverse polyp, clip and spot applied   METATARSAL OSTEOTOMY Bilateral ~ 1980   removed part of 5th metatarsal to corect curvature of toes    RIGHT HEART CATH N/A 05/16/2019   Procedure: RIGHT HEART CATH;  Surgeon: Dolores Patty, MD;  Location: [facility] INVASIVE  CV LAB;  Service: Cardiovascular;  Laterality: N/A;   TEE WITHOUT CARDIOVERSION  05/16/2019   Procedure: Transesophageal Echocardiogram (Tee);  Surgeon: Constance Haw, MD;  Location: [facility];  Service: Cardiovascular;;   TEE WITHOUT CARDIOVERSION N/A 03/07/2020   Procedure: TRANSESOPHAGEAL ECHOCARDIOGRAM  (TEE);  Surgeon: Freada Bergeron, MD;  Location: [facility] ENDOSCOPY;  Service: Cardiovascular;  Laterality: N/A;    Family History  Problem Relation Age of Onset   Clotting disorder Mother    Heart disease Mother        s/p MI   Heart attack Mother    Hypertension Mother    Diabetes Mother    Hyperlipidemia Mother    Stroke Mother    Cancer Father        ? lung   Lung disease Father        smoker   Diabetes Sister    Hypertension Sister        smoker   Aneurysm Sister        brain   Other Sister        clipped   Seizures Sister        d/o w/aneurysm/ smoker   Leukemia Maternal Grandmother        ?   Colon cancer Neg Hx    Colon polyps Neg Hx    Stomach cancer Neg Hx    Rectal cancer Neg Hx     Social History   Socioeconomic History   Marital status: Married    Spouse name: Not on file   Number of children: 4   Years of education: 12   Highest education level: High school graduate  Occupational History   Occupation: retired    Fish farm manager: [employer]  Tobacco Use   Smoking status: Former    Packs/day: 1.00    Years: 10.00    Pack years: 10.00    Types: Cigarettes    Quit date: 06/07/1980    Years since quitting: 41.1   Smokeless tobacco: Never  Vaping Use   Vaping Use: Never used  Substance and Sexual Activity   Alcohol use: No   Drug use: No   Sexual activity: Not Currently  Other Topics Concern   Not on file  Social History Narrative   Lives with male partner in a one story home.  His son lives there off and on.  Retired from [employer].  Education: high school.  Right handed   Social Determinants of Health   Financial Resource Strain: Not on file  Food Insecurity: Not on file  Transportation Needs: Not on file  Physical Activity: Not on file  Stress: Not on file  Social Connections: Not on file  Intimate Partner Violence: Not on file    Outpatient Medications Prior to Visit  Medication Sig Dispense Refill   Accu-Chek Softclix Lancets lancets  Use as instructed 200 each 3   albuterol (VENTOLIN HFA) 108 (90 Base) MCG/ACT inhaler Inhale 2 puffs into the lungs every 6 (six) hours as needed for wheezing or shortness of breath. 1 Inhaler 6   Alcohol Swabs (DROPSAFE ALCOHOL PREP) 70 % PADS USE AS DIRECTED 300 each 1   allopurinol (ZYLOPRIM) 300 MG tablet Take 1 tablet by mouth once daily 90 tablet 1   apixaban (ELIQUIS) 5 MG TABS tablet Take 1 tablet by mouth twice daily 180 tablet 1   Blood Glucose Monitoring Suppl (ACCU-CHEK GUIDE ME) w/Device KIT Use as instructed to check blood sugar 1-2 times daily.  1 kit 0   calcium carbonate (TUMS - DOSED IN MG ELEMENTAL CALCIUM) 500 MG chewable tablet Chew 500 mg by mouth daily as needed for indigestion or heartburn.     cetirizine (ZYRTEC) 10 MG tablet Take 1 tablet (10 mg total) by mouth daily. 30 tablet 11   clobetasol ointment (TEMOVATE) 6.59 % 1 application to affected area     Clobetasol Prop Emollient Base (CLOBETASOL PROPIONATE E) 0.05 % emollient cream Apply 1 application topically 2 (two) times daily as needed (insect bite). 60 g 1   colchicine (COLCRYS) 0.6 MG tablet Take one tablet for 4-6 days as needed for gout flare up. 15 tablet 3   dapagliflozin propanediol (FARXIGA) 10 MG TABS tablet TAKE 1 TABLET BY MOUTH ONCE DAILY 90 tablet 3   fish oil-omega-3 fatty acids 1000 MG capsule Take 1 g by mouth daily.     furosemide (LASIX) 80 MG tablet Take 1 tablet (80 mg total) by mouth daily. As needed patient may take an additional 40 MG tablet as direct per Alleviate Research study PRN plan. 90 tablet 3   gabapentin (NEURONTIN) 100 MG capsule Take 2 capsules (200 mg total) by mouth at bedtime. 180 capsule 3   glucose blood (ACCU-CHEK GUIDE) test strip Use as instructed to check blood sugar 1-2 times daily 200 each 3   hydrALAZINE (APRESOLINE) 25 MG tablet TAKE 1 TABLET BY MOUTH THREE TIMES DAILY 270 tablet 3   HYDROcodone-acetaminophen (NORCO) 10-325 MG tablet Take 1 tablet by mouth every 8 (eight)  hours as needed for moderate pain or severe pain. 90 tablet 0   hydrocortisone 2.5 % ointment Apply topically 2 (two) times daily as needed. 30 g 1   isosorbide mononitrate (IMDUR) 30 MG 24 hr tablet Take 1 tablet (30 mg total) by mouth daily. 30 tablet 6   magnesium oxide (MAG-OX) 400 MG tablet Take 1 tablet (400 mg total) by mouth 2 (two) times daily. 30 tablet 11   metoprolol succinate (TOPROL-XL) 50 MG 24 hr tablet TAKE 1 TABLET BY MOUTH TWICE DAILY WITH A MEAL OR  IMMEDIATELY  FOLLOWING  A  MEAL 180 tablet 2   Multiple Vitamin (MULTIVITAMIN WITH MINERALS) TABS tablet Take 1 tablet by mouth daily.      naproxen (NAPROSYN) 375 MG tablet Take 375 mg by mouth 2 (two) times daily as needed (knee pain.).      Polyethyl Glycol-Propyl Glycol (SYSTANE OP) Place 1 drop into both eyes daily as needed (dry eyes).     potassium chloride SA (KLOR-CON M20) 20 MEQ tablet Take 1 tablet (20 mEq total) by mouth as directed. 1 tablet daily 20 mEq by mouth as directed per alleviate research protocol. 90 tablet 3   pravastatin (PRAVACHOL) 40 MG tablet Take 1 tablet by mouth once daily 90 tablet 3   Semaglutide,0.25 or 0.5MG /DOS, (OZEMPIC, 0.25 OR 0.5 MG/DOSE,) 2 MG/1.5ML SOPN Inject 0.5 mg into the skin once a week. 4.5 mL 3   tamsulosin (FLOMAX) 0.4 MG CAPS capsule Take 1 capsule by mouth once daily 30 capsule 0   white petrolatum (VASELINE) GEL See admin instructions.     glyBURIDE (DIABETA) 5 MG tablet Take 1/2 tablet by mouth before dinner 90 tablet 1   LORazepam (ATIVAN) 1 MG tablet TAKE 1 TABLET BY MOUTH EVERY 8 HOURS AS NEEDED FOR ANXIETY 30 tablet 0   temazepam (RESTORIL) 30 MG capsule Take 1 capsule by mouth at bedtime 30 capsule 0   No facility-administered medications prior to  visit.    Allergies  Allergen Reactions   Sulfonamide Derivatives Other (See Comments)    Flu like symptoms    Zolpidem Other (See Comments)    Excessive, prolonged sedation    Review of Systems  Constitutional:   Negative for fever and malaise/fatigue.  HENT:  Negative for congestion.   Eyes:  Negative for blurred vision.  Respiratory:  Negative for shortness of breath.   Cardiovascular:  Negative for chest pain, palpitations and leg swelling.  Gastrointestinal:  Negative for abdominal pain, blood in stool and nausea.  Genitourinary:  Negative for dysuria and frequency.  Musculoskeletal:  Negative for falls.  Skin:  Negative for rash.  Neurological:  Negative for dizziness, loss of consciousness and headaches.  Endo/Heme/Allergies:  Negative for environmental allergies.  Psychiatric/Behavioral:  Negative for depression. The patient is not nervous/anxious.       Objective:    Physical Exam Constitutional:      General: He is not in acute distress.    Appearance: Normal appearance. He is not ill-appearing or toxic-appearing.  HENT:     Head: Normocephalic and atraumatic.     Right Ear: External ear normal.     Left Ear: External ear normal.     Nose: Nose normal.  Eyes:     General:        Right eye: No discharge.        Left eye: No discharge.  Cardiovascular:     Rate and Rhythm: Normal rate.  Pulmonary:     Effort: Pulmonary effort is normal.     Breath sounds: Normal breath sounds.  Abdominal:     Palpations: Abdomen is soft.     Tenderness: There is no guarding.  Musculoskeletal:     Cervical back: Neck supple.  Skin:    Findings: No rash.  Neurological:     Mental Status: He is alert and oriented to person, place, and time.  Psychiatric:        Behavior: Behavior normal.    BP (!) 112/58    Pulse 91    Temp 97.9 F (36.6 C)    Resp 16    Ht $R'5\' 11"'vl$  (1.803 m)    Wt 192 lb 9.6 oz (87.4 kg)    SpO2 93%    BMI 26.86 kg/m  Wt Readings from Last 3 Encounters:  07/07/21 193 lb 3.2 oz (87.6 kg)  07/07/21 192 lb 9.6 oz (87.4 kg)  06/30/21 192 lb (87.1 kg)    Diabetic Foot Exam - Simple   No data filed    Lab Results  Component Value Date   WBC 3.7 05/13/2021   HGB 14.4  05/13/2021   HCT 46.0 05/13/2021   PLT 171 05/13/2021   GLUCOSE 111 (H) 07/07/2021   CHOL 119 03/06/2021   TRIG 123.0 03/06/2021   HDL 34.60 (L) 03/06/2021   LDLDIRECT 84.0 05/02/2018   LDLCALC 60 03/06/2021   ALT 13 07/07/2021   AST 16 07/07/2021   NA 142 07/07/2021   K 4.6 07/07/2021   CL 102 07/07/2021   CREATININE 1.80 (H) 07/07/2021   BUN 31 (H) 07/07/2021   CO2 34 (H) 07/07/2021   TSH 2.16 07/07/2021   PSA 10.76 (H) 02/06/2019   INR 2.2 09/06/2018   HGBA1C 5.9 (A) 06/30/2021   MICROALBUR 1.1 04/14/2015    Lab Results  Component Value Date   TSH 2.16 07/07/2021   Lab Results  Component Value Date   WBC 3.7 05/13/2021   HGB  14.4 05/13/2021   HCT 46.0 05/13/2021   MCV 84 05/13/2021   PLT 171 05/13/2021   Lab Results  Component Value Date   NA 142 07/07/2021   K 4.6 07/07/2021   CO2 34 (H) 07/07/2021   GLUCOSE 111 (H) 07/07/2021   BUN 31 (H) 07/07/2021   CREATININE 1.80 (H) 07/07/2021   BILITOT 0.6 07/07/2021   ALKPHOS 45 07/07/2021   AST 16 07/07/2021   ALT 13 07/07/2021   PROT 7.4 07/07/2021   ALBUMIN 4.4 07/07/2021   CALCIUM 10.0 07/07/2021   ANIONGAP 10 11/26/2019   EGFR 47 (L) 05/13/2021   GFR 38.20 (L) 07/07/2021   Lab Results  Component Value Date   CHOL 119 03/06/2021   Lab Results  Component Value Date   HDL 34.60 (L) 03/06/2021   Lab Results  Component Value Date   LDLCALC 60 03/06/2021   Lab Results  Component Value Date   TRIG 123.0 03/06/2021   Lab Results  Component Value Date   CHOLHDL 3 03/06/2021   Lab Results  Component Value Date   HGBA1C 5.9 (A) 06/30/2021       Assessment & Plan:   Problem List Items Addressed This Visit     Diabetes mellitus type 2 in obese ([place])    hgba1c acceptable, minimize simple carbs. Increase exercise as tolerated. Continue current meds      Anemia   Essential hypertension    Well controlled, no changes to meds. Encouraged heart healthy diet such as the DASH diet and exercise as  tolerated.       Relevant Orders   TSH (Completed)   Comprehensive metabolic panel (Completed)   VITAMIN D 25 Hydroxy (Vit-D Deficiency, Fractures) (Completed)   Hyperlipidemia    Encourage heart healthy diet such as MIND or DASH diet, increase exercise, avoid trans fats, simple carbohydrates and processed foods, consider a krill or fish or flaxseed oil cap daily.       Anxiety state    Overall he reports he is doing well. May continue current meds prn      Relevant Medications   LORazepam (ATIVAN) 1 MG tablet   Insomnia    Encouraged good sleep hygiene such as dark, quiet room. No blue/green glowing lights such as computer screens in bedroom. No alcohol or stimulants in evening. Cut down on caffeine as able. Regular exercise is helpful but not just prior to bed time. Continue current meds      Benign hypertensive kidney disease with chronic kidney disease    Well controlled, no changes to meds. Encouraged heart healthy diet such as the DASH diet and exercise as tolerated. Renal functions slowly worsening referred back to [facility] for ongoing surveillance      Other Visit Diagnoses     High risk medication use    -  Primary   Relevant Orders   Drug Monitoring Panel [phone] , Urine   Long-term current use of benzodiazepine       Relevant Orders   Drug Monitoring Panel [phone] , Urine   Right hip pain       Relevant Orders   VITAMIN D 25 Hydroxy (Vit-D Deficiency, Fractures) (Completed)   DG Pelvis 1-2 Views   Need for pneumococcal vaccination       Relevant Orders   Pneumococcal conjugate vaccine 20-valent ([id number]) (Completed)       I have changed Kobey H. Mulvihill's temazepam and LORazepam. I am also having him start on tiZANidine. Additionally, I am  having him maintain his fish oil-omega-3 fatty acids, cetirizine, albuterol, HYDROcodone-acetaminophen, multivitamin with minerals, naproxen, tamsulosin, magnesium oxide, Clobetasol Prop Emollient Base,  hydrocortisone, calcium carbonate, Polyethyl Glycol-Propyl Glycol (SYSTANE OP), colchicine, gabapentin, pravastatin, dapagliflozin propanediol, Accu-Chek Guide Me, Accu-Chek Guide, Accu-Chek Softclix Lancets, potassium chloride SA, Eliquis, clobetasol ointment, white petrolatum, allopurinol, Ozempic (0.25 or 0.5 MG/DOSE), hydrALAZINE, metoprolol succinate, DropSafe Alcohol Prep, isosorbide mononitrate, and furosemide.  Meds ordered this encounter  Medications   tiZANidine (ZANAFLEX) 2 MG tablet    Sig: Take 0.5-1 tablets (1-2 mg total) by mouth at bedtime as needed for muscle spasms.    Dispense:  3020 tablet    Refill:  1   temazepam (RESTORIL) 30 MG capsule    Sig: Take 1 capsule (30 mg total) by mouth at bedtime.    Dispense:  30 capsule    Refill:  5   LORazepam (ATIVAN) 1 MG tablet    Sig: Take 1 tablet (1 mg total) by mouth every 8 (eight) hours as needed. for anxiety    Dispense:  30 tablet    Refill:  5     Penni Homans, MD

## 2021-07-08 NOTE — Assessment & Plan Note (Signed)
Overall he reports he is doing well. May continue current meds prn

## 2021-07-09 LAB — DRUG MONITORING PANEL 376104, URINE
Alphahydroxyalprazolam: NEGATIVE ng/mL (ref <25)
Alphahydroxymidazolam: NEGATIVE ng/mL (ref <50)
Alphahydroxytriazolam: NEGATIVE ng/mL (ref <50)
Aminoclonazepam: NEGATIVE ng/mL (ref <25)
Amphetamines: NEGATIVE ng/mL (ref <500)
Barbiturates: NEGATIVE ng/mL (ref <300)
Benzodiazepines: POSITIVE ng/mL — AB (ref <100)
Cocaine Metabolite: NEGATIVE ng/mL (ref <150)
Desmethyltramadol: NEGATIVE ng/mL (ref <100)
Hydroxyethylflurazepam: NEGATIVE ng/mL (ref <50)
Lorazepam: NEGATIVE ng/mL (ref <50)
Nordiazepam: NEGATIVE ng/mL (ref <50)
Opiates: NEGATIVE ng/mL (ref <100)
Oxazepam: 508 ng/mL — ABNORMAL HIGH (ref <50)
Oxycodone: NEGATIVE ng/mL (ref <100)
Temazepam: >6250 ng/mL — ABNORMAL HIGH (ref <50)
Tramadol: NEGATIVE ng/mL (ref <100)

## 2021-07-09 LAB — DM TEMPLATE

## 2021-07-12 ENCOUNTER — Other Ambulatory Visit: Payer: Self-pay | Admitting: Family Medicine

## 2021-07-12 DIAGNOSIS — H409 Unspecified glaucoma: Secondary | ICD-10-CM

## 2021-07-12 DIAGNOSIS — E875 Hyperkalemia: Secondary | ICD-10-CM

## 2021-07-12 DIAGNOSIS — Z7901 Long term (current) use of anticoagulants: Secondary | ICD-10-CM

## 2021-07-12 DIAGNOSIS — F411 Generalized anxiety disorder: Secondary | ICD-10-CM

## 2021-07-12 DIAGNOSIS — M25561 Pain in right knee: Secondary | ICD-10-CM

## 2021-07-12 DIAGNOSIS — E785 Hyperlipidemia, unspecified: Secondary | ICD-10-CM

## 2021-07-12 DIAGNOSIS — E1169 Type 2 diabetes mellitus with other specified complication: Secondary | ICD-10-CM

## 2021-07-12 DIAGNOSIS — I10 Essential (primary) hypertension: Secondary | ICD-10-CM

## 2021-07-12 DIAGNOSIS — D649 Anemia, unspecified: Secondary | ICD-10-CM

## 2021-07-12 DIAGNOSIS — G4700 Insomnia, unspecified: Secondary | ICD-10-CM

## 2021-07-12 DIAGNOSIS — M25562 Pain in left knee: Secondary | ICD-10-CM

## 2021-07-13 ENCOUNTER — Other Ambulatory Visit: Payer: Self-pay

## 2021-07-13 ENCOUNTER — Ambulatory Visit (INDEPENDENT_AMBULATORY_CARE_PROVIDER_SITE_OTHER): Payer: Medicare HMO | Admitting: Podiatry

## 2021-07-13 DIAGNOSIS — M79674 Pain in right toe(s): Secondary | ICD-10-CM

## 2021-07-13 DIAGNOSIS — E1142 Type 2 diabetes mellitus with diabetic polyneuropathy: Secondary | ICD-10-CM | POA: Diagnosis not present

## 2021-07-13 DIAGNOSIS — B351 Tinea unguium: Secondary | ICD-10-CM | POA: Diagnosis not present

## 2021-07-13 DIAGNOSIS — M79675 Pain in left toe(s): Secondary | ICD-10-CM | POA: Diagnosis not present

## 2021-07-14 ENCOUNTER — Encounter: Payer: Medicare HMO | Admitting: Cardiology

## 2021-07-18 ENCOUNTER — Encounter: Payer: Self-pay | Admitting: Podiatry

## 2021-07-18 NOTE — Progress Notes (Signed)
°  Subjective:  Patient ID: [name], male    DOB: [birth year],  MRN: [id number]  71 y.o. male presents with at risk foot care with history of diabetic neuropathy and painful elongated mycotic toenails 1-5 bilaterally which are tender when wearing enclosed shoe gear. Pain is relieved with periodic professional debridement..    Patient's blood sugar was 111 mg/dl today.    He states he did receive his new diabetic shoes on last month and is very pleased with them.  New problem(s): None    PCP: [name], MD and last visit [id number] 31, 2023.  Review of Systems: Negative except as noted in the HPI.   Allergies  Allergen Reactions   Sulfonamide Derivatives Other (See Comments)    Flu like symptoms    Zolpidem Other (See Comments)    Excessive, prolonged sedation    Objective:  There were no vitals filed for this visit. Constitutional Patient is a pleasant 71 y.o. African American male WD, WN in NAD. AAO x 3.  Vascular CFT immediate b/l LE. Palpable DP/PT pulses b/l LE. Digital hair sparse b/l. Skin temperature gradient WNL b/l. No pain with calf compression b/l. No edema noted b/l. No cyanosis or clubbing noted b/l LE.  Neurologic Pt has subjective symptoms of neuropathy. Protective sensation intact 5/5 intact bilaterally with 10g monofilament b/l. Vibratory sensation intact b/l.  Dermatologic Pedal integument with normal turgor, texture and tone b/l LE. No open wounds b/l. No interdigital macerations b/l. Toenails 1-5 b/l elongated, thickened, discolored with subungual debris. +Tenderness with dorsal palpation of nailplates. No hyperkeratotic or porokeratotic lesions present.  Orthopedic: Muscle strength 5/5 to all lower extremity muscle groups bilaterally. No pain, crepitus or joint limitation noted with ROM bilateral LE. Hammertoe deformity noted 2-5 b/l.   Hemoglobin A1C Latest Ref Rng & Units 06/30/2021 03/06/2021 02/24/2021 10/21/2020 07/22/2020  HGBA1C 4.0 - 5.6 % 5.9(A)  8.2(H) 7.4(A) 6.9(A) 6.3(A)  Some recent data might be hidden   Assessment:   1. Pain due to onychomycosis of toenails of both feet   2. Diabetic peripheral neuropathy associated with type 2 diabetes mellitus ([place])    Plan:  Patient was evaluated and treated and all questions answered. Consent given for treatment as described below: -Mycotic toenails 1-5 bilaterally were debrided in length and girth with sterile nail nippers and dremel without incident. -Patient/POA to call should there be question/concern in the interim.  Return in about 3 months (around 10/10/2021).  Marzetta Board, DPM

## 2021-07-21 ENCOUNTER — Other Ambulatory Visit: Payer: Self-pay

## 2021-07-21 ENCOUNTER — Ambulatory Visit (HOSPITAL_COMMUNITY)
Admission: RE | Admit: 2021-07-21 | Discharge: 2021-07-21 | Disposition: A | Discharge status: Home / Self Care | Payer: Medicare HMO | Source: Ambulatory Visit | Attending: Internal Medicine | Admitting: Internal Medicine

## 2021-07-21 DIAGNOSIS — E785 Hyperlipidemia, unspecified: Secondary | ICD-10-CM | POA: Insufficient documentation

## 2021-07-21 DIAGNOSIS — I110 Hypertensive heart disease with heart failure: Secondary | ICD-10-CM | POA: Insufficient documentation

## 2021-07-21 DIAGNOSIS — I5022 Chronic systolic (congestive) heart failure: Secondary | ICD-10-CM | POA: Diagnosis not present

## 2021-07-21 DIAGNOSIS — I4891 Unspecified atrial fibrillation: Secondary | ICD-10-CM | POA: Insufficient documentation

## 2021-07-21 DIAGNOSIS — I77819 Aortic ectasia, unspecified site: Secondary | ICD-10-CM | POA: Diagnosis not present

## 2021-07-21 DIAGNOSIS — E119 Type 2 diabetes mellitus without complications: Secondary | ICD-10-CM | POA: Insufficient documentation

## 2021-07-21 DIAGNOSIS — I700 Atherosclerosis of aorta: Secondary | ICD-10-CM | POA: Diagnosis not present

## 2021-07-21 DIAGNOSIS — I083 Combined rheumatic disorders of mitral, aortic and tricuspid valves: Secondary | ICD-10-CM | POA: Insufficient documentation

## 2021-07-21 LAB — ECHOCARDIOGRAM COMPLETE
Calc EF: 45.7 %
P 1/2 time: 388 msec
S' Lateral: 4.4 cm
Single Plane A2C EF: 40.8 %
Single Plane A4C EF: 48.1 %

## 2021-07-22 DIAGNOSIS — E1122 Type 2 diabetes mellitus with diabetic chronic kidney disease: Secondary | ICD-10-CM | POA: Diagnosis not present

## 2021-07-22 DIAGNOSIS — M109 Gout, unspecified: Secondary | ICD-10-CM | POA: Diagnosis not present

## 2021-07-22 DIAGNOSIS — N1832 Chronic kidney disease, stage 3b: Secondary | ICD-10-CM | POA: Diagnosis not present

## 2021-07-22 DIAGNOSIS — I129 Hypertensive chronic kidney disease with stage 1 through stage 4 chronic kidney disease, or unspecified chronic kidney disease: Secondary | ICD-10-CM | POA: Diagnosis not present

## 2021-07-22 DIAGNOSIS — J449 Chronic obstructive pulmonary disease, unspecified: Secondary | ICD-10-CM | POA: Diagnosis not present

## 2021-07-22 DIAGNOSIS — N2581 Secondary hyperparathyroidism of renal origin: Secondary | ICD-10-CM | POA: Diagnosis not present

## 2021-07-29 ENCOUNTER — Telehealth: Payer: Self-pay

## 2021-07-29 NOTE — Telephone Encounter (Signed)
Called and advised patient assistance medication delivered to the office and ready for pick up. 5 Boxes of Ozempic.

## 2021-07-30 NOTE — Telephone Encounter (Signed)
Pt came by and picked up 5 boxes of Ozempic medication.

## 2021-07-31 ENCOUNTER — Telehealth (HOSPITAL_COMMUNITY): Payer: Self-pay | Admitting: *Deleted

## 2021-07-31 NOTE — Telephone Encounter (Signed)
Pt left vm requesting echo results.  Routed to [place].

## 2021-08-05 ENCOUNTER — Telehealth: Payer: Self-pay | Admitting: Cardiovascular Disease

## 2021-08-05 NOTE — Telephone Encounter (Signed)
Pt c/o medication issue: ? ?1. Name of Medication: apixaban (ELIQUIS) 5 MG TABS tablet ? ?2. How are you currently taking this medication (dosage and times per day)? 1 tablet twice a day ? ?3. Are you having a reaction (difficulty breathing--STAT)? no ? ?4. What is your medication issue? Patient states Valentino Hue needs another application put in for his assistance. He says it is okay to leave a detailed voicemail.  ?

## 2021-08-05 NOTE — Telephone Encounter (Signed)
Spoke to patient . Patient will come to office and pick  patient assistance for Eliquis. ? He is aware to fill out his portion and out of med med. ?Patient states he received the letter from  [facility] today indicating he needs to renew application. ? ?

## 2021-08-18 ENCOUNTER — Telehealth: Payer: Self-pay | Admitting: *Deleted

## 2021-08-18 MED ORDER — APIXABAN 5 MG PO TABS: 5.0000 mg | ORAL_TABLET | Freq: Two times a day (BID) | ORAL | 3 refills | Status: DC

## 2021-08-18 NOTE — Telephone Encounter (Signed)
Eliquis assistance has been faxed to [facility] ?

## 2021-08-31 ENCOUNTER — Other Ambulatory Visit: Payer: Self-pay | Admitting: Cardiology

## 2021-08-31 DIAGNOSIS — Z006 Encounter for examination for normal comparison and control in clinical research program: Secondary | ICD-10-CM

## 2021-08-31 NOTE — Research (Signed)
Alleviate HF  ? ?Carelink download ? ? ? ? ? ? ?

## 2021-09-15 ENCOUNTER — Encounter: Payer: Self-pay | Admitting: Cardiology

## 2021-09-15 ENCOUNTER — Ambulatory Visit: Payer: Medicare HMO | Admitting: Cardiology

## 2021-09-15 VITALS — BP 114/70 | HR 73 | Ht 71.0 in | Wt 187.4 lb

## 2021-09-15 DIAGNOSIS — I5022 Chronic systolic (congestive) heart failure: Secondary | ICD-10-CM

## 2021-09-15 DIAGNOSIS — I4819 Other persistent atrial fibrillation: Secondary | ICD-10-CM

## 2021-09-15 DIAGNOSIS — I10 Essential (primary) hypertension: Secondary | ICD-10-CM | POA: Diagnosis not present

## 2021-09-15 DIAGNOSIS — I4892 Unspecified atrial flutter: Secondary | ICD-10-CM

## 2021-09-15 DIAGNOSIS — I428 Other cardiomyopathies: Secondary | ICD-10-CM | POA: Diagnosis not present

## 2021-09-15 NOTE — Progress Notes (Signed)
? ?Electrophysiology Office Note ? ? ?Date:  09/15/2021  ? ?ID:  [name], DOB 1950/10/23, MRN [id number] ? ?PCP:  [name], MD  ?Cardiologist:  Croitrou ?Primary Electrophysiologist:  Cyd Hostler Meredith [name], MD   ? ?No chief complaint on file. ? ? ?  ?History of Present Illness: ?[name] is a 71 y.o. male who is being seen today for the evaluation of atrial fibrillation at the request of Mihai Croitrou. Presenting today for electrophysiology evaluation.   ? ?He has a history significant for atrial fibrillation, atrial flutter, aortic insufficiency, hyperlipidemia, OSA, hypertension, type 2 diabetes, diastolic heart failure.  He had rapid atrial fibrillation and went into heart failure.  He had an ablation 05/16/2019.  He was in acute heart failure at the time of ablation requiring a long hospital stay post procedure.  He went back into atrial fibrillation with repeat ablation 03/10/2020.  At that time his posterior wall and pulmonary veins were isolated.  He had ablation of the anterior wall of the left atrium. ? ?Today, denies symptoms of palpitations, chest pain, shortness of breath, orthopnea, PND, lower extremity edema, claudication, dizziness, presyncope, syncope, bleeding, or neurologic sequela. The patient is tolerating medications without difficulties.  Since being seen he has done well.  He has had no chest pain or shortness of breath.  He is able to all of his daily activities without restriction.  He is overall happy with his control. ? ?Past Medical History:  ?Diagnosis Date  ? AORTIC STENOSIS, MODERATE 12/05/2009  ? Arthritis   ? Atrial fibrillation ([facility])   ? Atrial fibrillation with RVR ([place]) 07/05/2017  ? Atrial flutter ([place]) 12/05/2009  ? Atypical chest pain 11/08/2011  ? BENIGN PROSTATIC HYPERTROPHY, HX OF 12/05/2009  ? CHEST PAIN-UNSPECIFIED 11/11/2009  ? CHF 12/05/2009  ? Chronic renal insufficiency 12/16/2016  ? Debility 04/18/2012  ? Diarrhea 12/16/2016  ? DYSPNEA 12/19/2009  ?  Edema 04/18/2012  ? ERECTILE DYSFUNCTION 01/25/2007  ? FATIGUE, CHRONIC 11/11/2009  ? Gout 07/10/2012  ? Heart murmur   ? History of kidney stones   ? "they passed" (07/05/2017)  ? Hyperkalemia 04/18/2012  ? Hyperlipidemia   ? Hypertension   ? HYPERTENSION 01/25/2007  ? Hypoxia 05/05/2011  ? Insomnia 05/13/2016  ? INSOMNIA, HX OF 01/25/2007  ? Knee pain, bilateral 12/28/2010  ? Knee pain, right 12/28/2010  ? NEUROMA 01/05/2010  ? OBESITY NOS 01/25/2007  ? OSA on CPAP 04/06/2010  ? PERIPHERAL NEUROPATHY 01/05/2010  ? PULMONARY FUNCTION TESTS, ABNORMAL 02/02/2010  ? Sleep apnea   ? wears CPAP  ? Superficial thrombophlebitis of left leg 09/07/2011  ? TESTICULAR HYPOFUNCTION 01/05/2010  ? 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 Toe pain, right 07/10/2012  ? Type II diabetes mellitus ([place]) 12/05/2009  ? UNSPECIFIED ANEMIA 01/05/2010  ? ?Past Surgical History:  ?Procedure Laterality Date  ? A-FLUTTER ABLATION N/A 05/16/2019  ? Procedure: A-FLUTTER ABLATION;  Surgeon: Constance Haw, MD;  Location: [facility];  Service: Cardiovascular;  Laterality: N/A;  ? ATRIAL FIBRILLATION ABLATION N/A 05/16/2019  ? Procedure: ATRIAL FIBRILLATION ABLATION;  Surgeon: Constance Haw, MD;  Location: [facility];  Service: Cardiovascular;  Laterality: N/A;  ? ATRIAL FIBRILLATION ABLATION N/A 03/10/2020  ? Procedure: ATRIAL FIBRILLATION ABLATION;  Surgeon: Constance Haw, MD;  Location: [facility];  Service: Cardiovascular;  Laterality: N/A;  ? CARDIAC CATHETERIZATION  10/16/2009  ? nonischemic cardiomyopathy  ? CARDIOVERSION N/A 03/25/2015  ? Procedure: CARDIOVERSION;  Surgeon: Chrissie Noa  Wells Guiles, MD;  Location: [place];  Service: Cardiovascular;  Laterality: N/A;  ? CARDIOVERSION N/A 06/17/2017  ? Procedure: CARDIOVERSION;  Surgeon: Sanda Klein, MD;  Location: [place];  Service: Cardiovascular;  Laterality: N/A;  ? CARDIOVERSION N/A 07/08/2017  ? Procedure: CARDIOVERSION;  Surgeon: Fay Records,  MD;  Location: [place];  Service: Cardiovascular;  Laterality: N/A;  ? CARDIOVERSION N/A 02/27/2019  ? Procedure: CARDIOVERSION;  Surgeon: Sanda Klein, MD;  Location: [place];  Service: Cardiovascular;  Laterality: N/A;  ? CARDIOVERSION N/A 08/16/2019  ? Procedure: CARDIOVERSION;  Surgeon: Sanda Klein, MD;  Location: [place];  Service: Cardiovascular;  Laterality: N/A;  ? COLONOSCOPY  03/10/2021  ? 1st colonoscopy , Wilfrid Lund, [facility], large transverse polyp, clip and spot applied  ? METATARSAL OSTEOTOMY Bilateral ~ 1980  ? removed part of 5th metatarsal to corect curvature of toes   ? RIGHT HEART CATH N/A 05/16/2019  ? Procedure: RIGHT HEART CATH;  Surgeon: Jolaine Artist, MD;  Location: [place] [facility];  Service: Cardiovascular;  Laterality: N/A;  ? TEE WITHOUT CARDIOVERSION  05/16/2019  ? Procedure: Transesophageal Echocardiogram (Tee);  Surgeon: Constance Haw, MD;  Location: [facility];  Service: Cardiovascular;;  ? TEE WITHOUT CARDIOVERSION N/A 03/07/2020  ? Procedure: TRANSESOPHAGEAL ECHOCARDIOGRAM (TEE);  Surgeon: Freada Bergeron, MD;  Location: [facility];  Service: Cardiovascular;  Laterality: N/A;  ? ? ? ?Current Outpatient Medications  ?Medication Sig Dispense Refill  ? Accu-Chek Softclix Lancets lancets Use as instructed 200 each 3  ? albuterol (VENTOLIN HFA) 108 (90 Base) MCG/ACT inhaler Inhale 2 puffs into the lungs every 6 (six) hours as needed for wheezing or shortness of breath. 1 Inhaler 6  ? Alcohol Swabs (DROPSAFE ALCOHOL PREP) 70 % PADS USE AS DIRECTED 300 each 1  ? allopurinol (ZYLOPRIM) 300 MG tablet Take 1 tablet by mouth once daily 90 tablet 0  ? apixaban (ELIQUIS) 5 MG TABS tablet Take 1 tablet (5 mg total) by mouth 2 (two) times daily. 180 tablet 3  ? Blood Glucose Monitoring Suppl (ACCU-CHEK GUIDE ME) w/Device KIT Use as instructed to check blood sugar 1-2 times daily. 1 kit 0  ? calcium carbonate (TUMS - DOSED IN MG ELEMENTAL CALCIUM)  500 MG chewable tablet Chew 500 mg by mouth daily as needed for indigestion or heartburn.    ? cetirizine (ZYRTEC) 10 MG tablet Take 1 tablet (10 mg total) by mouth daily. 30 tablet 11  ? clobetasol ointment (TEMOVATE) 0.62 % 1 application to affected area    ? Clobetasol Prop Emollient Base (CLOBETASOL PROPIONATE E) 0.05 % emollient cream Apply 1 application topically 2 (two) times daily as needed (insect bite). 60 g 1  ? colchicine (COLCRYS) 0.6 MG tablet Take one tablet for 4-6 days as needed for gout flare up. 15 tablet 3  ? dapagliflozin propanediol (FARXIGA) 10 MG TABS tablet TAKE 1 TABLET BY MOUTH ONCE DAILY 90 tablet 3  ? fish oil-omega-3 fatty acids 1000 MG capsule Take 1 g by mouth daily.    ? FLUZONE HIGH-DOSE QUADRIVALENT 0.7 ML SUSY     ? furosemide (LASIX) 80 MG tablet Take 1 tablet (80 mg total) by mouth daily. As needed patient may take an additional 40 MG tablet as direct per [employer] study PRN plan. 90 tablet 3  ? gabapentin (NEURONTIN) 100 MG capsule Take 2 capsules (200 mg total) by mouth at bedtime. 180 capsule 3  ? GAVILYTE-G 236 g solution Take 4,000 mLs  by mouth once.    ? glucose blood (ACCU-CHEK GUIDE) test strip Use as instructed to check blood sugar 1-2 times daily 200 each 3  ? hydrALAZINE (APRESOLINE) 25 MG tablet TAKE 1 TABLET BY MOUTH THREE TIMES DAILY 270 tablet 3  ? HYDROcodone-acetaminophen (NORCO) 10-325 MG tablet Take 1 tablet by mouth every 8 (eight) hours as needed for moderate pain or severe pain. 90 tablet 0  ? hydrocortisone 2.5 % ointment Apply topically 2 (two) times daily as needed. 30 g 1  ? isosorbide mononitrate (IMDUR) 30 MG 24 hr tablet Take 1 tablet (30 mg total) by mouth daily. 30 tablet 6  ? LORazepam (ATIVAN) 1 MG tablet Take 1 tablet (1 mg total) by mouth every 8 (eight) hours as needed. for anxiety 30 tablet 5  ? magnesium oxide (MAG-OX) 400 MG tablet Take 1 tablet (400 mg total) by mouth 2 (two) times daily. 30 tablet 11  ? metoprolol succinate  (TOPROL-XL) 50 MG 24 hr tablet TAKE 1 TABLET BY MOUTH TWICE DAILY WITH A MEAL OR  IMMEDIATELY  FOLLOWING  A  MEAL 180 tablet 2  ? Multiple Vitamin (MULTIVITAMIN WITH MINERALS) TABS tablet Take 1 tablet by mou

## 2021-09-15 NOTE — Patient Instructions (Signed)
Medication Instructions:  ?Your physician recommends that you continue on your current medications as directed. Please refer to the Current Medication list given to you today. ?*If you need a refill on your cardiac medications before your next appointment, please call your pharmacy* ? ?Lab Work: ?None. ?If you have labs (blood work) drawn today and your tests are completely normal, you will receive your results only by: ?MyChart Message (if you have MyChart) OR ?A paper copy in the mail ?If you have any lab test that is abnormal or we need to change your treatment, we will call you to review the results. ? ?Testing/Procedures: ?None. ? ?Follow-Up: ?At [facility], you and your health needs are our priority.  As part of our continuing mission to provide you with exceptional heart care, we have created designated Provider Care Teams.  These Care Teams include your primary Cardiologist (physician) and Advanced Practice Providers (APPs -  Physician Assistants and Nurse Practitioners) who all work together to provide you with the care you need, when you need it. ? ?Your physician wants you to follow-up in: 6  months with Will Curt Bears, MD  ?  You will receive a reminder letter in the mail two months in advance. If you don't receive a letter, please call our office to schedule the follow-up appointment. ? ?We recommend signing up for the patient portal called "MyChart".  Sign up information is provided on this After Visit Summary.  MyChart is used to connect with patients for Virtual Visits (Telemedicine).  Patients are able to view lab/test results, encounter notes, upcoming appointments, etc.  Non-urgent messages can be sent to your provider as well.   ?To learn more about what you can do with MyChart, go to [email or web address].   ? ?Any Other Special Instructions Will Be Listed Below (If Applicable). ? ? ? ? ?  ? ? ?

## 2021-09-28 ENCOUNTER — Other Ambulatory Visit: Payer: Self-pay | Admitting: Cardiology

## 2021-09-30 ENCOUNTER — Telehealth: Payer: Self-pay | Admitting: Cardiovascular Disease

## 2021-09-30 NOTE — Telephone Encounter (Signed)
? ?  Pt c/o medication issue: ? ?1. Name of Medication: Eliquis ? ?2. How are you currently taking this medication (dosage and times per day)?  ? ?3. Are you having a reaction (difficulty breathing--STAT)? ? ?4. What is your medication issue? Pt is calling to f/u his pt assistance and if he can get samples since he will be out of meds on sunday ?

## 2021-09-30 NOTE — Telephone Encounter (Signed)
Patient will call [name] to check an update. ? ?Medication Samples have been provided to the patient. ? ?Drug name: Eliquis       Strength: 5 mg        Qty: 3 boxes  LOT: [id number] Exp.Date: 2/25 ? ? ? ?

## 2021-10-01 ENCOUNTER — Telehealth: Payer: Self-pay

## 2021-10-01 NOTE — Telephone Encounter (Signed)
Patient did not rec his 66mf/u letter or call. Came in to schedule.  There was nothing to work with.  Would you please find a day and I will call him and send the reminder.  Thanks  VJocelyn Lamer 10-01-21  Provider: Dr CSallyanne Kuster?

## 2021-10-02 ENCOUNTER — Encounter: Payer: Self-pay | Admitting: *Deleted

## 2021-10-02 NOTE — Telephone Encounter (Signed)
MyChart message sent for appointment ?

## 2021-10-06 ENCOUNTER — Other Ambulatory Visit: Payer: Self-pay | Admitting: Family Medicine

## 2021-10-06 DIAGNOSIS — I10 Essential (primary) hypertension: Secondary | ICD-10-CM

## 2021-10-06 DIAGNOSIS — E785 Hyperlipidemia, unspecified: Secondary | ICD-10-CM

## 2021-10-06 DIAGNOSIS — Z7901 Long term (current) use of anticoagulants: Secondary | ICD-10-CM

## 2021-10-06 DIAGNOSIS — H409 Unspecified glaucoma: Secondary | ICD-10-CM

## 2021-10-06 DIAGNOSIS — E119 Type 2 diabetes mellitus without complications: Secondary | ICD-10-CM

## 2021-10-06 DIAGNOSIS — D649 Anemia, unspecified: Secondary | ICD-10-CM

## 2021-10-06 DIAGNOSIS — G4700 Insomnia, unspecified: Secondary | ICD-10-CM

## 2021-10-06 DIAGNOSIS — M25561 Pain in right knee: Secondary | ICD-10-CM

## 2021-10-06 DIAGNOSIS — E669 Obesity, unspecified: Secondary | ICD-10-CM

## 2021-10-06 DIAGNOSIS — M25562 Pain in left knee: Secondary | ICD-10-CM

## 2021-10-06 DIAGNOSIS — E875 Hyperkalemia: Secondary | ICD-10-CM

## 2021-10-06 DIAGNOSIS — F411 Generalized anxiety disorder: Secondary | ICD-10-CM

## 2021-10-12 ENCOUNTER — Ambulatory Visit (INDEPENDENT_AMBULATORY_CARE_PROVIDER_SITE_OTHER): Payer: Medicare HMO | Admitting: Podiatry

## 2021-10-12 DIAGNOSIS — B351 Tinea unguium: Secondary | ICD-10-CM

## 2021-10-12 DIAGNOSIS — E1142 Type 2 diabetes mellitus with diabetic polyneuropathy: Secondary | ICD-10-CM

## 2021-10-12 DIAGNOSIS — M79675 Pain in left toe(s): Secondary | ICD-10-CM | POA: Diagnosis not present

## 2021-10-12 DIAGNOSIS — L84 Corns and callosities: Secondary | ICD-10-CM

## 2021-10-12 DIAGNOSIS — M79674 Pain in right toe(s): Secondary | ICD-10-CM | POA: Diagnosis not present

## 2021-10-20 ENCOUNTER — Encounter: Payer: Self-pay | Admitting: Podiatry

## 2021-10-20 NOTE — Progress Notes (Signed)
?  Subjective:  ?Patient ID: [name], male    DOB: 06-06-1951,  MRN: [id number] ? ?[name] presents to clinic today for at risk foot care with history of diabetic neuropathy and painful thick toenails that are difficult to trim. Pain interferes with ambulation. Aggravating factors include wearing enclosed shoe gear. Pain is relieved with periodic professional debridement. ? ?Patient states blood glucose was 134 mg/dl today.   ? ?Last known HgA1c was 5.5%. ? ?New problem(s): None.  ? ?PCP is [name], MD , and last visit was July 07, 2021. ? ?Allergies  ?Allergen Reactions  ? Sulfonamide Derivatives Other (See Comments)  ?  Flu like symptoms   ? Zolpidem Other (See Comments)  ?  Excessive, prolonged sedation  ? ? ?Review of Systems: Negative except as noted in the HPI. ? ?Objective: No changes noted in today's physical examination. ?Constitutional Patient is a pleasant 71 y.o. African American male WD, WN in NAD. AAO x 3.  ?Vascular CFT immediate b/l LE. Palpable DP/PT pulses b/l LE. Digital hair sparse b/l. Skin temperature gradient WNL b/l. No pain with calf compression b/l. No edema noted b/l. No cyanosis or clubbing noted b/l LE.  ?Neurologic Pt has subjective symptoms of neuropathy. Protective sensation intact 5/5 intact bilaterally with 10g monofilament b/l. Vibratory sensation intact b/l.  ?Dermatologic Pedal integument with normal turgor, texture and tone b/l LE. No open wounds b/l. No interdigital macerations b/l. Toenails 1-5 b/l elongated, thickened, discolored with subungual debris. +Tenderness with dorsal palpation of nailplates. Hyperkeratotic lesion(s) lateral nailfold bilateral 5th digits.  No erythema, no edema, no drainage, no fluctuance.[name]  ?Orthopedic: Muscle strength 5/5 to all lower extremity muscle groups bilaterally. No pain, crepitus or joint limitation noted with ROM bilateral LE. Hammertoe deformity noted 2-5 b/l.  ? ? ?  Latest Ref Rng & Units 06/30/2021  ? 11:03 AM  03/06/2021  ?  9:42 AM 02/24/2021  ?  2:48 PM 10/21/2020  ?  3:06 PM  ?Hemoglobin A1C  ?Hemoglobin-A1c 4.0 - 5.6 % 5.9   8.2   7.4   6.9    ? ?Assessment/Plan: ?1. Pain due to onychomycosis of toenails of both feet   ?2. Corns   ?3. Diabetic peripheral neuropathy associated with type 2 diabetes mellitus ([place])   ?  ?-Examined patient. ?-Patient to continue soft, supportive shoe gear daily. ?-Corn(s) bilateral 5th toes pared utilizing sterile scalpel blade without complication or incident. Total number debrided=2. ?-Patient/POA to call should there be question/concern in the interim.  ? ?Return in about 3 months (around 01/12/2022). ? ?[name], DPM  ?

## 2021-10-29 ENCOUNTER — Encounter: Payer: Self-pay | Admitting: Internal Medicine

## 2021-10-29 ENCOUNTER — Ambulatory Visit: Payer: Medicare HMO | Admitting: Internal Medicine

## 2021-10-29 VITALS — BP 128/78 | HR 70 | Ht 71.0 in | Wt 184.6 lb

## 2021-10-29 DIAGNOSIS — E1169 Type 2 diabetes mellitus with other specified complication: Secondary | ICD-10-CM | POA: Diagnosis not present

## 2021-10-29 DIAGNOSIS — E663 Overweight: Secondary | ICD-10-CM | POA: Diagnosis not present

## 2021-10-29 DIAGNOSIS — E782 Mixed hyperlipidemia: Secondary | ICD-10-CM

## 2021-10-29 DIAGNOSIS — G63 Polyneuropathy in diseases classified elsewhere: Secondary | ICD-10-CM | POA: Diagnosis not present

## 2021-10-29 DIAGNOSIS — E669 Obesity, unspecified: Secondary | ICD-10-CM | POA: Diagnosis not present

## 2021-10-29 LAB — POCT GLYCOSYLATED HEMOGLOBIN (HGB A1C): Hemoglobin A1C: 5.8 % — AB (ref 4.0–5.6)

## 2021-10-29 NOTE — Patient Instructions (Signed)
Please continue: - Farxiga 10 mg daily - Ozempic 0.5 mg weekly  Please come back for a follow-up appointment in 6 months. 

## 2021-10-29 NOTE — Progress Notes (Signed)
Patient ID: [name], male   DOB: 1950/12/14, 71 y.o.   MRN: [id number]  This visit occurred during the SARS-CoV-2 public health emergency.  Safety protocols were in place, including screening questions prior to the visit, additional usage of staff PPE, and extensive cleaning of exam room while observing appropriate contact time as indicated for disinfecting solutions.   HPI: [name] is a 71 y.o.-year-old male, initially referred by his cardiologist, Dr. Sallyanne Kuster, returning for follow-up DM2, dx in 2011, non-insulin-dependent, uncontrolled, with complications (CHF, CKD, ED).  Last visit 4 months ago.  Interim hx: No increased urination, blurry vision, nausea, chest pain. He started to eliminate animal protein last year. He also joined the [employer].  He is doing intermittent fasting - 18-6 -eating a window in the afternoon. He continues to stay active working in his garden.  Reviewed HbA1c levels Lab Results  Component Value Date   HGBA1C 5.9 (A) 06/30/2021   HGBA1C 8.2 (H) 03/06/2021   HGBA1C 7.4 (A) 02/24/2021   HGBA1C 6.9 (A) 10/21/2020   HGBA1C 6.3 (A) 07/22/2020   HGBA1C 8.1 (A) 04/18/2020   HGBA1C 6.6 (A) 12/14/2019   HGBA1C 5.3 09/14/2019   HGBA1C 6.4 06/11/2019   HGBA1C 5.8 02/06/2019   HGBA1C 5.7 08/03/2018   HGBA1C 7.0 (H) 05/02/2018   HGBA1C 6.7 (H) 01/24/2018   HGBA1C 6.2 10/21/2017   HGBA1C 7.2 (H) 03/18/2017   HGBA1C 7.0 (H) 11/15/2016   HGBA1C 7.5 (H) 08/20/2016   HGBA1C 6.8 (H) 05/21/2016   HGBA1C 6.7 (H) 02/18/2016   HGBA1C 6.4 11/17/2015   HGBA1C 6.4 07/15/2015   HGBA1C 6.6 (H) 04/14/2015   HGBA1C 6.1 01/07/2015   HGBA1C 6.8 (H) 09/13/2014   HGBA1C 6.8 (H) 06/13/2014   HGBA1C 6.6 (H) 01/09/2014   HGBA1C 6.4 (H) 10/08/2013   HGBA1C 7.0 (H) 07/11/2013   HGBA1C 6.6 (H) 01/31/2013   HGBA1C 5.9 (H) 10/31/2012   Pt is on a regimen of: - Farxiga 10 mg daily-started by Dr. Haroldine Laws - Ozempic 0.25 >> 0.5 mg weekly - started 03/29/2021 - through  PAP Stopped Glyburide 02/2021.  He was not able to start Ozempic 0.5 mg- 04/2020 - due to price. He was on Metformin in the past.  Pt checks his sugars once a day: - am: 95-142, 154, 170 >> 97- 179 >> 144-171 >> 104-128, 134 >> 114-127, 141 - 2h after b'fast: n/c - before lunch: n/c - 2h after lunch: n/c >> 165 >> n/c >> 224>> 93-124 - before dinner: 109, 118 >> 81-133, 235 >> 178-227 >> n/c - 2h after dinner: 142-175 >> 109, 122, 144 >> n/c >> 107-161 >> 94 - bedtime: 68-155, 176, 376  >> 154, 157, 200 >> n/c - nighttime: n/c>> 128 Lowest sugar was 60 >> .[name]. 104 >> 93; he has hypoglycemia awareness in the 50s.  Highest sugar was 376 (cheese cake) >>...  227 >> 160s >> 142.  Glucometer: One Touch verio IQ  Pt's meals are: - Breakfast: oatmeal + berries/apples + cinnamon, cereal, fruit - Lunch: light meal - Dinner:  + veggies, occasionally fried foods (1x a mo) - Snacks: cranberry juice, grape juice, aple juice >> diluted by at least 50%.  -+ CKD-sees Dr. Juleen China, last BUN/creatinine:  Lab Results  Component Value Date   BUN 31 (H) 07/07/2021   BUN 28 (H) 05/13/2021   CREATININE 1.80 (H) 07/07/2021   CREATININE 1.60 (H) 05/13/2021   Lab Results  Component Value Date   GFRAA 42 (L) 05/20/2020  GFRAA 38 (L) 02/25/2020   GFRAA 36 (L) 11/26/2019   GFRAA 39 (L) 11/12/2019   GFRAA 36 (L) 09/18/2019   GFRAA 37 (L) 08/30/2019   GFRAA 39 (L) 08/14/2019   GFRAA 45 (L) 07/11/2019   GFRAA 51 (L) 05/29/2019   GFRAA 58 (L) 05/28/2019  He is not on ACE inhibitor/ARB.  -+ HL; last set of lipids: Lab Results  Component Value Date   CHOL 119 03/06/2021   HDL 34.60 (L) 03/06/2021   LDLCALC 60 03/06/2021   LDLDIRECT 84.0 05/02/2018   TRIG 123.0 03/06/2021   CHOLHDL 3 03/06/2021  On pravastatin 40, omega-3 fatty acids.  - last eye exam was 09/03/2020: Reportedly no DR, + glaucoma. Dr. Manuella Ghazi.  -+ numbness and tingling in his feet.Dr. Sallyanne Kuster started him on Gabapentin 100 mg  daily.  Currently on 100-200 milligrams daily.  Last foot exam 10/12/2021 (Dr. Elisha Ponder)  Pt has FH of DM in mother, sister.  He is on amiodarone.  No FH of MTC. No personal hx of pancreatitis.  ROS: + see HPI Neurological: + tremors/+ numbness/no tingling/no dizziness  I reviewed pt's medications, allergies, PMH, social hx, family hx, and changes were documented in the history of present illness. Otherwise, unchanged from my initial visit note.  Past Medical History:  Diagnosis Date   AORTIC STENOSIS, MODERATE 12/05/2009   Arthritis    Atrial fibrillation (HCC)    Atrial fibrillation with RVR ([place]) 07/05/2017   Atrial flutter ([place]) 12/05/2009   Atypical chest pain 11/08/2011   BENIGN PROSTATIC HYPERTROPHY, HX OF 12/05/2009   CHEST PAIN-UNSPECIFIED 11/11/2009   CHF 12/05/2009   Chronic renal insufficiency 12/16/2016   Debility 04/18/2012   Diarrhea 12/16/2016   DYSPNEA 12/19/2009   Edema 04/18/2012   ERECTILE DYSFUNCTION 01/25/2007   FATIGUE, CHRONIC 11/11/2009   Gout 07/10/2012   Heart murmur    History of kidney stones    "they passed" (07/05/2017)   Hyperkalemia 04/18/2012   Hyperlipidemia    Hypertension    HYPERTENSION 01/25/2007   Hypoxia 05/05/2011   Insomnia 05/13/2016   INSOMNIA, HX OF 01/25/2007   Knee pain, bilateral 12/28/2010   Knee pain, right 12/28/2010   NEUROMA 01/05/2010   OBESITY NOS 01/25/2007   OSA on CPAP 04/06/2010   PERIPHERAL NEUROPATHY 01/05/2010   PULMONARY FUNCTION TESTS, ABNORMAL 02/02/2010   Sleep apnea    wears CPAP   Superficial thrombophlebitis of left leg 09/07/2011   TESTICULAR HYPOFUNCTION 01/05/2010   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   Toe pain, right 07/10/2012   Type II diabetes mellitus ([place]) 12/05/2009   UNSPECIFIED ANEMIA 01/05/2010   Past Surgical History:  Procedure Laterality Date   A-FLUTTER ABLATION N/A 05/16/2019   Procedure: A-FLUTTER ABLATION;  Surgeon: Constance Haw, MD;  Location: [place] [facility];   Service: Cardiovascular;  Laterality: N/A;   ATRIAL FIBRILLATION ABLATION N/A 05/16/2019   Procedure: ATRIAL FIBRILLATION ABLATION;  Surgeon: Constance Haw, MD;  Location: [place] [facility];  Service: Cardiovascular;  Laterality: N/A;   ATRIAL FIBRILLATION ABLATION N/A 03/10/2020   Procedure: ATRIAL FIBRILLATION ABLATION;  Surgeon: Constance Haw, MD;  Location: [place] [facility];  Service: Cardiovascular;  Laterality: N/A;   CARDIAC CATHETERIZATION  10/16/2009   nonischemic cardiomyopathy   CARDIOVERSION N/A 03/25/2015   Procedure: CARDIOVERSION;  Surgeon: Pixie Casino, MD;  Location: [place];  Service: Cardiovascular;  Laterality: N/A;   CARDIOVERSION N/A 06/17/2017   Procedure: CARDIOVERSION;  Surgeon: Sanda Klein, MD;  Location: [place];  Service: Cardiovascular;  Laterality: N/A;   CARDIOVERSION N/A 07/08/2017   Procedure: CARDIOVERSION;  Surgeon: Fay Records, MD;  Location: [place];  Service: Cardiovascular;  Laterality: N/A;   CARDIOVERSION N/A 02/27/2019   Procedure: CARDIOVERSION;  Surgeon: Sanda Klein, MD;  Location: [place];  Service: Cardiovascular;  Laterality: N/A;   CARDIOVERSION N/A 08/16/2019   Procedure: CARDIOVERSION;  Surgeon: Sanda Klein, MD;  Location: [place];  Service: Cardiovascular;  Laterality: N/A;   COLONOSCOPY  03/10/2021   1st colonoscopy , Wilfrid Lund, [facility], large transverse polyp, clip and spot applied   METATARSAL OSTEOTOMY Bilateral ~ 1980   removed part of 5th metatarsal to corect curvature of toes    RIGHT HEART CATH N/A 05/16/2019   Procedure: RIGHT HEART CATH;  Surgeon: Jolaine Artist, MD;  Location: [place] [facility];  Service: Cardiovascular;  Laterality: N/A;   TEE WITHOUT CARDIOVERSION  05/16/2019   Procedure: Transesophageal Echocardiogram ([place]);  Surgeon: Constance Haw, MD;  Location: [place] [facility];  Service: Cardiovascular;;   TEE WITHOUT CARDIOVERSION N/A 03/07/2020    Procedure: TRANSESOPHAGEAL ECHOCARDIOGRAM (TEE);  Surgeon: Freada Bergeron, MD;  Location: [facility];  Service: Cardiovascular;  Laterality: N/A;   Social History   Socioeconomic History   Marital status: Married    Spouse name: Not on file   Number of children: 4   Years of education: 12   Highest education level: High school graduate  Occupational History   Occupation: retired    Fish farm manager: [employer]  Tobacco Use   Smoking status: Former    Packs/day: 1.00    Years: 10.00    Pack years: 10.00    Types: Cigarettes    Quit date: 06/07/1980    Years since quitting: 41.4   Smokeless tobacco: Never  Vaping Use   Vaping Use: Never used  Substance and Sexual Activity   Alcohol use: No   Drug use: No   Sexual activity: Not Currently  Other Topics Concern   Not on file  Social History Narrative   Lives with male partner in a one story home.  His son lives there off and on.  Retired from [employer].  Education: high school.  Right handed   Social Determinants of Health   Financial Resource Strain: Not on file  Food Insecurity: Not on file  Transportation Needs: Not on file  Physical Activity: Not on file  Stress: Not on file  Social Connections: Not on file  Intimate Partner Violence: Not on file   Current Outpatient Medications on File Prior to Visit  Medication Sig Dispense Refill   Accu-Chek Softclix Lancets lancets Use as instructed 200 each 3   albuterol (VENTOLIN HFA) 108 (90 Base) MCG/ACT inhaler Inhale 2 puffs into the lungs every 6 (six) hours as needed for wheezing or shortness of breath. 1 Inhaler 6   Alcohol Swabs (DROPSAFE ALCOHOL PREP) 70 % PADS USE AS DIRECTED 300 each 1   allopurinol (ZYLOPRIM) 300 MG tablet Take 1 tablet by mouth once daily 90 tablet 0   apixaban (ELIQUIS) 5 MG TABS tablet Take 1 tablet (5 mg total) by mouth 2 (two) times daily. 180 tablet 3   Blood Glucose Monitoring Suppl (ACCU-CHEK GUIDE ME) w/Device KIT Use as instructed to check  blood sugar 1-2 times daily. 1 kit 0   calcium carbonate (TUMS - DOSED IN MG ELEMENTAL CALCIUM) 500 MG chewable tablet Chew 500 mg by mouth daily as needed for indigestion or heartburn.  cetirizine (ZYRTEC) 10 MG tablet Take 1 tablet (10 mg total) by mouth daily. 30 tablet 11   clobetasol ointment (TEMOVATE) 3.23 % 1 application to affected area     Clobetasol Prop Emollient Base (CLOBETASOL PROPIONATE E) 0.05 % emollient cream Apply 1 application topically 2 (two) times daily as needed (insect bite). 60 g 1   colchicine (COLCRYS) 0.6 MG tablet Take one tablet for 4-6 days as needed for gout flare up. 15 tablet 3   dapagliflozin propanediol (FARXIGA) 10 MG TABS tablet TAKE 1 TABLET BY MOUTH ONCE DAILY 90 tablet 3   fish oil-omega-3 fatty acids 1000 MG capsule Take 1 g by mouth daily.     FLUZONE HIGH-DOSE QUADRIVALENT 0.7 ML SUSY      furosemide (LASIX) 80 MG tablet Take 1 tablet (80 mg total) by mouth daily. As needed patient may take an additional 40 MG tablet as direct per [employer] study PRN plan. 90 tablet 3   gabapentin (NEURONTIN) 100 MG capsule Take 2 capsules (200 mg total) by mouth at bedtime. 180 capsule 3   GAVILYTE-G 236 g solution Take 4,000 mLs by mouth once.     glucose blood (ACCU-CHEK GUIDE) test strip Use as instructed to check blood sugar 1-2 times daily 200 each 3   hydrALAZINE (APRESOLINE) 25 MG tablet TAKE 1 TABLET BY MOUTH THREE TIMES DAILY 270 tablet 3   HYDROcodone-acetaminophen (NORCO) 10-325 MG tablet Take 1 tablet by mouth every 8 (eight) hours as needed for moderate pain or severe pain. 90 tablet 0   hydrocortisone 2.5 % ointment Apply topically 2 (two) times daily as needed. 30 g 1   isosorbide mononitrate (IMDUR) 30 MG 24 hr tablet Take 1 tablet (30 mg total) by mouth daily. 30 tablet 6   LORazepam (ATIVAN) 1 MG tablet Take 1 tablet (1 mg total) by mouth every 8 (eight) hours as needed. for anxiety 30 tablet 5   magnesium oxide (MAG-OX) 400 MG tablet Take  1 tablet (400 mg total) by mouth 2 (two) times daily. 30 tablet 11   metoprolol succinate (TOPROL-XL) 50 MG 24 hr tablet TAKE 1 TABLET BY MOUTH TWICE DAILY WITH A MEAL OR  IMMEDIATELY  FOLLOWING  A  MEAL 180 tablet 2   Multiple Vitamin (MULTIVITAMIN WITH MINERALS) TABS tablet Take 1 tablet by mouth daily.      naproxen (NAPROSYN) 375 MG tablet Take 375 mg by mouth 2 (two) times daily as needed (knee pain.).      PFIZER COVID-19 VAC BIVALENT injection      Polyethyl Glycol-Propyl Glycol (SYSTANE OP) Place 1 drop into both eyes daily as needed (dry eyes).     potassium chloride SA (KLOR-CON M20) 20 MEQ tablet Take 1 tablet (20 mEq total) by mouth as directed. 1 tablet daily 20 mEq by mouth as directed per alleviate research protocol. 90 tablet 3   pravastatin (PRAVACHOL) 40 MG tablet Take 1 tablet (40 mg total) by mouth daily. 90 tablet 3   Semaglutide,0.25 or 0.5MG/DOS, (OZEMPIC, 0.25 OR 0.5 MG/DOSE,) 2 MG/1.5ML SOPN Inject 0.5 mg into the skin once a week. 4.5 mL 3   tamsulosin (FLOMAX) 0.4 MG CAPS capsule Take 1 capsule by mouth once daily 30 capsule 0   temazepam (RESTORIL) 30 MG capsule Take 1 capsule (30 mg total) by mouth at bedtime. 30 capsule 5   tiZANidine (ZANAFLEX) 2 MG tablet Take 0.5-1 tablets (1-2 mg total) by mouth at bedtime as needed for muscle spasms. 3020 tablet 1  VITAMIN D PO Take by mouth.     white petrolatum (VASELINE) GEL See admin instructions.     No current facility-administered medications on file prior to visit.   Allergies  Allergen Reactions   Sulfonamide Derivatives Other (See Comments)    Flu like symptoms    Zolpidem Other (See Comments)    Excessive, prolonged sedation   Family History  Problem Relation Age of Onset   Clotting disorder Mother    Heart disease Mother        s/p MI   Heart attack Mother    Hypertension Mother    Diabetes Mother    Hyperlipidemia Mother    Stroke Mother    Cancer Father        ? lung   Lung disease Father         smoker   Diabetes Sister    Hypertension Sister        smoker   Aneurysm Sister        brain   Other Sister        clipped   Seizures Sister        d/o w/aneurysm/ smoker   Leukemia Maternal Grandmother        ?   Colon cancer Neg Hx    Colon polyps Neg Hx    Stomach cancer Neg Hx    Rectal cancer Neg Hx    PE: BP 128/78 (BP Location: Right Arm, Patient Position: Sitting, Cuff Size: Normal)   Pulse 70   Ht _0  (1.803 m)   Wt 184 lb 9.6 oz (83.7 kg)   SpO2 96%   BMI 25.75 kg/m  Wt Readings from Last 3 Encounters:  10/29/21 184 lb 9.6 oz (83.7 kg)  09/15/21 187 lb 6.4 oz (85 kg)  07/07/21 193 lb 3.2 oz (87.6 kg)   Constitutional: overweight, in NAD Eyes: PERRLA, EOMI, no exophthalmos ENT: moist mucous membranes, no thyromegaly, no cervical lymphadenopathy Cardiovascular: RRR, No MRG Respiratory: CTA B Musculoskeletal: no deformities, strength intact in all 4 Skin: moist, warm, no rashes Neurological: no tremor with outstretched hands, DTR normal in all 4  ASSESSMENT: 1. DM2, non-insulin-dependent, uncontrolled, with long-term complications - s and d CHF - A fib - s/p cardioversion - 05/2019 >> went into cardiac shock - CKD - sees nephrology  2.  Overweight  3. HL  4.  Peripheral neuropathy  PLAN:  1. Patient with longstanding, previously uncontrolled type 2 diabetes, on SGLT2 inhibitor and sulfonylurea at last visit.  We stopped sulfonylurea and switch to a GLP-1 receptor agonist.  He was able to start Ozempic, which he obtains from the patient assistance program.  He tolerates it well.  He also obtains [place] from the patient assistance program.  He continues to stay active and continues on a mostly whole food plant-based diet.  He is now also doing intermittent fasting.  He lost 9 pounds since last visit. -At last visit we did not change his regimen.  At that time, HbA1c was better, at 5.9%.  Sugars at home were almost all at goal.   -At today's visit,  reviewing his meter download, sugars remain almost all at goal.  He has no low blood sugars and no significant high CBGs.  He tolerates his regimen well. - I suggested to:  Patient Instructions  Please continue: - Farxiga 10 mg daily - Ozempic 0.5 mg weekly  Please come back for a follow-up appointment in 6 months.  - we checked his  HbA1c: 5.8% (excellent, slightly lower) - advised to check sugars at different times of the day - 1x a day, rotating check times - advised for yearly eye exams >> he is UTD - return to clinic in 6 months  2.  Overweight -continue SGLT 2 inhibitor and GLP-1 receptor agonist which should also help with weight loss -He gained 4 pounds before last visit, previously lost 5 -lost 9 lbs since last OV-intermittent fasting  3. HL -Reviewed latest lipid panel from 02/2021: LDL slightly above our goal of less than 55, HDL low: Lab Results  Component Value Date   CHOL 119 03/06/2021   HDL 34.60 (L) 03/06/2021   LDLCALC 60 03/06/2021   LDLDIRECT 84.0 05/02/2018   TRIG 123.0 03/06/2021   CHOLHDL 3 03/06/2021  -He continues on pravastatin 40 mg daily and omega-3 fatty acids without side effects  4.  Peripheral neuropathy -Most likely related to diabetes  -On gabapentin 100 to 200 mg at bedtime, initially added by Dr. Sallyanne Kuster -He still has numbness and tingling -I sent a prescription for diabetic shoes before last visit  Philemon Kingdom, MD PhD [facility] Endocrinology

## 2021-11-03 ENCOUNTER — Ambulatory Visit (INDEPENDENT_AMBULATORY_CARE_PROVIDER_SITE_OTHER): Payer: Medicare HMO | Admitting: Family Medicine

## 2021-11-03 ENCOUNTER — Encounter: Payer: Self-pay | Admitting: Family Medicine

## 2021-11-03 VITALS — BP 118/70 | HR 82 | Resp 20 | Ht 71.0 in | Wt 184.4 lb

## 2021-11-03 DIAGNOSIS — I5042 Chronic combined systolic (congestive) and diastolic (congestive) heart failure: Secondary | ICD-10-CM

## 2021-11-03 DIAGNOSIS — G4700 Insomnia, unspecified: Secondary | ICD-10-CM | POA: Diagnosis not present

## 2021-11-03 DIAGNOSIS — I10 Essential (primary) hypertension: Secondary | ICD-10-CM | POA: Diagnosis not present

## 2021-11-03 DIAGNOSIS — D649 Anemia, unspecified: Secondary | ICD-10-CM

## 2021-11-03 DIAGNOSIS — M1A9XX0 Chronic gout, unspecified, without tophus (tophi): Secondary | ICD-10-CM

## 2021-11-03 DIAGNOSIS — E669 Obesity, unspecified: Secondary | ICD-10-CM | POA: Diagnosis not present

## 2021-11-03 DIAGNOSIS — E1169 Type 2 diabetes mellitus with other specified complication: Secondary | ICD-10-CM

## 2021-11-03 DIAGNOSIS — E782 Mixed hyperlipidemia: Secondary | ICD-10-CM

## 2021-11-03 DIAGNOSIS — N189 Chronic kidney disease, unspecified: Secondary | ICD-10-CM

## 2021-11-03 LAB — CBC
HCT: 41.5 % (ref 39.0–52.0)
Hemoglobin: 13.4 g/dL (ref 13.0–17.0)
MCHC: 32.3 g/dL (ref 30.0–36.0)
MCV: 82.4 fl (ref 78.0–100.0)
Platelets: 178 10*3/uL (ref 150.0–400.0)
RBC: 5.04 Mil/uL (ref 4.22–5.81)
RDW: 16.8 % — ABNORMAL HIGH (ref 11.5–15.5)
WBC: 4.5 10*3/uL (ref 4.0–10.5)

## 2021-11-03 LAB — COMPREHENSIVE METABOLIC PANEL
ALT: 12 U/L (ref 0–53)
AST: 17 U/L (ref 0–37)
Albumin: 4.4 g/dL (ref 3.5–5.2)
Alkaline Phosphatase: 48 U/L (ref 39–117)
BUN: 38 mg/dL — ABNORMAL HIGH (ref 6–23)
CO2: 32 mEq/L (ref 19–32)
Calcium: 10.3 mg/dL (ref 8.4–10.5)
Chloride: 99 mEq/L (ref 96–112)
Creatinine, Ser: 1.94 mg/dL — ABNORMAL HIGH (ref 0.40–1.50)
GFR: 34.84 mL/min — ABNORMAL LOW (ref >60.00)
Glucose, Bld: 112 mg/dL — ABNORMAL HIGH (ref 70–99)
Potassium: 4.4 mEq/L (ref 3.5–5.1)
Sodium: 139 mEq/L (ref 135–145)
Total Bilirubin: 0.7 mg/dL (ref 0.2–1.2)
Total Protein: 7.3 g/dL (ref 6.0–8.3)

## 2021-11-03 LAB — LIPID PANEL
Cholesterol: 147 mg/dL (ref 0–200)
HDL: 41.5 mg/dL (ref >39.00)
LDL Cholesterol: 88 mg/dL (ref 0–99)
NonHDL: 105.81
Total CHOL/HDL Ratio: 4
Triglycerides: 87 mg/dL (ref 0.0–149.0)
VLDL: 17.4 mg/dL (ref 0.0–40.0)

## 2021-11-03 LAB — MICROALBUMIN / CREATININE URINE RATIO
Creatinine,U: 115.6 mg/dL
Microalb Creat Ratio: 0.6 mg/g (ref 0.0–30.0)
Microalb, Ur: <0.7 mg/dL (ref 0.0–1.9)

## 2021-11-03 LAB — URIC ACID: Uric Acid, Serum: 4.5 mg/dL (ref 4.0–7.8)

## 2021-11-03 LAB — TSH: TSH: 1.81 u[IU]/mL (ref 0.35–5.50)

## 2021-11-03 NOTE — Assessment & Plan Note (Signed)
Continues to improve and follows with chf clinic at [facility]

## 2021-11-03 NOTE — Assessment & Plan Note (Signed)
Hydrate and monitor 

## 2021-11-03 NOTE — Assessment & Plan Note (Signed)
Well controlled, no changes to meds. Encouraged heart healthy diet such as the DASH diet and exercise as tolerated.  °

## 2021-11-03 NOTE — Assessment & Plan Note (Signed)
Encouraged good sleep hygiene such as dark, quiet room. No blue/green glowing lights such as computer screens in bedroom. No alcohol or stimulants in evening. Cut down on caffeine as able. Regular exercise is helpful but not just prior to bed time. Doing well on Temazepam no changes

## 2021-11-03 NOTE — Patient Instructions (Signed)
Chronic Kidney Disease, Adult Chronic kidney disease (CKD) occurs when the kidneys are slowly and permanently damaged over a long period of time. The kidneys are a pair of organs that do many important jobs in the body, including: Removing waste and extra fluid from the blood to make urine. Making hormones that maintain the amount of fluid in tissues and blood vessels. Maintaining the right amount of fluids and chemicals in the body. A small amount of kidney damage may not cause problems, but a large amount of damage may make it hard or impossible for the kidneys to work right. Steps must be taken to slow kidney damage or to stop it from getting worse. If steps are not taken, the kidneys may stop working permanently (end-stage renal disease, or ESRD). Most of the time, CKD does not go away, but it can often be controlled. People who have CKD are usually able to live full lives. What are the causes? The most common causes of this condition are diabetes and high blood pressure (hypertension). Other causes include: Cardiovascular diseases. These affect the heart and blood vessels. Kidney diseases. These include: Glomerulonephritis, or inflammation of the tiny filters in the kidneys. Interstitial nephritis. This is swelling of the small tubes of the kidneys and of the surrounding structures. Polycystic kidney disease, in which clusters of fluid-filled sacs form within the kidneys. Renal vascular disease. This includes disorders that affect the arteries and veins of the kidneys. Diseases that affect the body's defense system (immune system). A problem with urine flow. This may be caused by: Kidney stones. Cancer. An enlarged prostate, in males. A kidney infection or urinary tract infection (UTI) that keeps coming back. Vasculitis. This is swelling or inflammation of the blood vessels. What increases the risk? Your chances of having kidney disease increase with age. The following factors may make  you more likely to develop this condition: A family history of kidney disease or kidney failure. Kidney failure means the kidneys can no longer work right. Certain genetic diseases. Taking medicines often that are damaging to the kidneys. Being around or being in contact with toxic substances. Obesity. A history of tobacco use. What are the signs or symptoms? Symptoms of this condition include: Feeling very tired (lethargic) and having less energy. Swelling, or edema, of the face, legs, ankles, or feet. Nausea or vomiting, or loss of appetite. Confusion or trouble concentrating. Muscle twitches and cramps, especially in the legs. Dry, itchy skin. A metallic taste in the mouth. Producing less urine, or producing more urine (especially at night). Shortness of breath. Trouble sleeping. CKD may also result in not having enough red blood cells or hemoglobin in the blood (anemia) or having weak bones (bone disease). Symptoms develop slowly and may not be obvious until the kidney damage becomes severe. It is possible to have kidney disease for years without having symptoms. How is this diagnosed? This condition may be diagnosed based on: Blood tests. Urine tests. Imaging tests, such as an ultrasound or a CT scan. A kidney biopsy. This involves removing a sample of kidney tissue to be looked at under a microscope. Results from these tests will help to determine how serious the CKD is. How is this treated? There is no cure for most cases of this condition, but treatment usually relieves symptoms and prevents or slows the worsening of the disease. Treatment may include: Diet changes, which may require you to avoid alcohol and foods that are high in salt, potassium, phosphorous, and protein. Medicines. These may:   Lower blood pressure. Control blood sugar (glucose). Relieve anemia. Relieve swelling. Protect your bones. Improve the balance of salts and minerals in your blood  (electrolytes). Dialysis, which is a type of treatment that removes toxic waste from the body. It may be needed if you have kidney failure. Managing any other conditions that are causing your CKD or making it worse. Follow these instructions at home: Medicines Take over-the-counter and prescription medicines only as told by your health care provider. The amount of some medicines that you take may need to be changed. Do not take any new medicines unless approved by your health care provider. Many medicines can make kidney damage worse. Do not take any vitamin and mineral supplements unless approved by your health care provider. Many nutritional supplements can make kidney damage worse. Lifestyle  Do not use any products that contain nicotine or tobacco, such as cigarettes, e-cigarettes, and chewing tobacco. If you need help quitting, ask your health care provider. If you drink alcohol: Limit how much you use to: 0-1 drink a day for women who are not pregnant. 0-2 drinks a day for men. Know how much alcohol is in your drink. In the [place] one drink equals one 12 oz bottle of beer (355 mL), one 5 oz glass of wine (148 mL), or one 1 oz glass of hard liquor (44 mL). Maintain a healthy weight. If you need help, ask your health care provider. General instructions  Follow instructions from your health care provider about eating or drinking restrictions, including any prescribed diet. Track your blood pressure at home. Report changes in your blood pressure as told. If you are being treated for diabetes, track your blood glucose levels as told. Start or continue an exercise plan. Exercise at least 30 minutes a day, 5 days a week. Keep your immunizations up to date as told. Keep all follow-up visits. This is important. Where to find more information American Association of Kidney Patients: www.aakp.org National Kidney Foundation: www.kidney.org American Kidney Fund: www.akfinc.org Life Options:  www.lifeoptions.org Kidney School: www.kidneyschool.org Contact a health care provider if: Your symptoms get worse. You develop new symptoms. Get help right away if: You develop symptoms of ESRD. These include: Headaches. Numbness in your hands or feet. Easy bruising. Frequent hiccups. Chest pain. Shortness of breath. Lack of menstrual periods, in women. You have a fever. You are producing less urine than usual. You have pain or bleeding when you urinate or when you have a bowel movement. These symptoms may represent a serious problem that is an emergency. Do not wait to see if the symptoms will go away. Get medical help right away. Call your local emergency services (911 in the [place] Do not drive yourself to the hospital. Summary Chronic kidney disease (CKD) occurs when the kidneys become damaged slowly over a long period of time. The most common causes of this condition are diabetes and high blood pressure (hypertension). There is no cure for most cases of CKD, but treatment usually relieves symptoms and prevents or slows the worsening of the disease. Treatment may include a combination of lifestyle changes, medicines, and dialysis. This information is not intended to replace advice given to you by your health care provider. Make sure you discuss any questions you have with your health care provider. Document Revised: 08/29/2019 Document Reviewed: 08/29/2019 Elsevier Patient Education  2023 Elsevier Inc.  

## 2021-11-03 NOTE — Assessment & Plan Note (Signed)
hgba1c acceptable, minimize simple carbs. Increase exercise as tolerated. Continue current meds 

## 2021-11-03 NOTE — Assessment & Plan Note (Signed)
No recent flares. Hydrate and monitor 

## 2021-11-03 NOTE — Progress Notes (Signed)
Subjective:   By signing my name below, I, [name], attest that this documentation has been prepared under the direction and in the presence of Kevin Lukes, MD. 11/03/2021    Patient ID: [name], male    DOB: 1951/05/05, 71 y.o.   MRN: [id number]  Chief Complaint  Patient presents with   Follow-up    HPI Patient is in today for an office visit and 4 months f/u  He is doing well at this time. No recent illnesses. No recent gout flare-ups.  He is intermittent fasting-18/6. Eating lots of fruits and vegetables and protein.   He recently saw Endo. No new medications.   He is requesting for a refill on naproxen.  Past Medical History:  Diagnosis Date   AORTIC STENOSIS, MODERATE 12/05/2009   Arthritis    Atrial fibrillation (HCC)    Atrial fibrillation with RVR ([place]) 07/05/2017   Atrial flutter ([place]) 12/05/2009   Atypical chest pain 11/08/2011   BENIGN PROSTATIC HYPERTROPHY, HX OF 12/05/2009   CHEST PAIN-UNSPECIFIED 11/11/2009   CHF 12/05/2009   Chronic renal insufficiency 12/16/2016   Debility 04/18/2012   Diarrhea 12/16/2016   DYSPNEA 12/19/2009   Edema 04/18/2012   ERECTILE DYSFUNCTION 01/25/2007   FATIGUE, CHRONIC 11/11/2009   Gout 07/10/2012   Heart murmur    History of kidney stones    "they passed" (07/05/2017)   Hyperkalemia 04/18/2012   Hyperlipidemia    Hypertension    HYPERTENSION 01/25/2007   Hypoxia 05/05/2011   Insomnia 05/13/2016   INSOMNIA, HX OF 01/25/2007   Knee pain, bilateral 12/28/2010   Knee pain, right 12/28/2010   NEUROMA 01/05/2010   OBESITY NOS 01/25/2007   OSA on CPAP 04/06/2010   PERIPHERAL NEUROPATHY 01/05/2010   PULMONARY FUNCTION TESTS, ABNORMAL 02/02/2010   Sleep apnea    wears CPAP   Superficial thrombophlebitis of left leg 09/07/2011   TESTICULAR HYPOFUNCTION 01/05/2010   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   Toe pain, right 07/10/2012   Type II diabetes mellitus ([place]) 12/05/2009   UNSPECIFIED ANEMIA 01/05/2010     Past Surgical History:  Procedure Laterality Date   A-FLUTTER ABLATION N/A 05/16/2019   Procedure: A-FLUTTER ABLATION;  Surgeon: Constance Haw, MD;  Location: [place] [facility];  Service: Cardiovascular;  Laterality: N/A;   ATRIAL FIBRILLATION ABLATION N/A 05/16/2019   Procedure: ATRIAL FIBRILLATION ABLATION;  Surgeon: Constance Haw, MD;  Location: [place] [facility];  Service: Cardiovascular;  Laterality: N/A;   ATRIAL FIBRILLATION ABLATION N/A 03/10/2020   Procedure: ATRIAL FIBRILLATION ABLATION;  Surgeon: Constance Haw, MD;  Location: [place] [facility];  Service: Cardiovascular;  Laterality: N/A;   CARDIAC CATHETERIZATION  10/16/2009   nonischemic cardiomyopathy   CARDIOVERSION N/A 03/25/2015   Procedure: CARDIOVERSION;  Surgeon: Pixie Casino, MD;  Location: [place];  Service: Cardiovascular;  Laterality: N/A;   CARDIOVERSION N/A 06/17/2017   Procedure: CARDIOVERSION;  Surgeon: Sanda Klein, MD;  Location: [facility];  Service: Cardiovascular;  Laterality: N/A;   CARDIOVERSION N/A 07/08/2017   Procedure: CARDIOVERSION;  Surgeon: Fay Records, MD;  Location: [place];  Service: Cardiovascular;  Laterality: N/A;   CARDIOVERSION N/A 02/27/2019   Procedure: CARDIOVERSION;  Surgeon: Sanda Klein, MD;  Location: [facility];  Service: Cardiovascular;  Laterality: N/A;   CARDIOVERSION N/A 08/16/2019   Procedure: CARDIOVERSION;  Surgeon: Sanda Klein, MD;  Location: [place];  Service: Cardiovascular;  Laterality: N/A;   COLONOSCOPY  03/10/2021   1st colonoscopy , [facility]id [facility], [place],  large transverse polyp, clip and spot applied   METATARSAL OSTEOTOMY Bilateral ~ 1980   removed part of 5th metatarsal to corect curvature of toes    RIGHT HEART CATH N/A 05/16/2019   Procedure: RIGHT HEART CATH;  Surgeon: Jolaine Artist, MD;  Location: [name] [facility];  Service: Cardiovascular;  Laterality: N/A;   TEE WITHOUT CARDIOVERSION  05/16/2019    Procedure: Transesophageal Echocardiogram (Tee);  Surgeon: Constance Haw, MD;  Location: [place] [facility];  Service: Cardiovascular;;   TEE WITHOUT CARDIOVERSION N/A 03/07/2020   Procedure: TRANSESOPHAGEAL ECHOCARDIOGRAM (TEE);  Surgeon: Freada Bergeron, MD;  Location: [facility];  Service: Cardiovascular;  Laterality: N/A;    Family History  Problem Relation Age of Onset   Clotting disorder Mother    Heart disease Mother        s/p MI   Heart attack Mother    Hypertension Mother    Diabetes Mother    Hyperlipidemia Mother    Stroke Mother    Cancer Father        ? lung   Lung disease Father        smoker   Diabetes Sister    Hypertension Sister        smoker   Aneurysm Sister        brain   Other Sister        clipped   Seizures Sister        d/o w/aneurysm/ smoker   Leukemia Maternal Grandmother        ?   Colon cancer Neg Hx    Colon polyps Neg Hx    Stomach cancer Neg Hx    Rectal cancer Neg Hx     Social History   Socioeconomic History   Marital status: Married    Spouse name: Not on file   Number of children: 4   Years of education: 12   Highest education level: High school graduate  Occupational History   Occupation: retired    Fish farm manager: [employer]  Tobacco Use   Smoking status: Former    Packs/day: 1.00    Years: 10.00    Pack years: 10.00    Types: Cigarettes    Quit date: 06/07/1980    Years since quitting: 41.4   Smokeless tobacco: Never  Vaping Use   Vaping Use: Never used  Substance and Sexual Activity   Alcohol use: No   Drug use: No   Sexual activity: Not Currently  Other Topics Concern   Not on file  Social History Narrative   Lives with male partner in a one story home.  His son lives there off and on.  Retired from [employer].  Education: high school.  Right handed   Social Determinants of Health   Financial Resource Strain: Not on file  Food Insecurity: Not on file  Transportation Needs: Not on file  Physical  Activity: Not on file  Stress: Not on file  Social Connections: Not on file  Intimate Partner Violence: Not on file    Outpatient Medications Prior to Visit  Medication Sig Dispense Refill   Accu-Chek Softclix Lancets lancets Use as instructed 200 each 3   albuterol (VENTOLIN HFA) 108 (90 Base) MCG/ACT inhaler Inhale 2 puffs into the lungs every 6 (six) hours as needed for wheezing or shortness of breath. 1 Inhaler 6   Alcohol Swabs (DROPSAFE ALCOHOL PREP) 70 % PADS USE AS DIRECTED 300 each 1   allopurinol (ZYLOPRIM) 300 MG  tablet Take 1 tablet by mouth once daily 90 tablet 0   apixaban (ELIQUIS) 5 MG TABS tablet Take 1 tablet (5 mg total) by mouth 2 (two) times daily. 180 tablet 3   Blood Glucose Monitoring Suppl (ACCU-CHEK GUIDE ME) w/Device KIT Use as instructed to check blood sugar 1-2 times daily. 1 kit 0   calcium carbonate (TUMS - DOSED IN MG ELEMENTAL CALCIUM) 500 MG chewable tablet Chew 500 mg by mouth daily as needed for indigestion or heartburn.     cetirizine (ZYRTEC) 10 MG tablet Take 1 tablet (10 mg total) by mouth daily. 30 tablet 11   clobetasol ointment (TEMOVATE) 8.75 % 1 application to affected area     Clobetasol Prop Emollient Base (CLOBETASOL PROPIONATE E) 0.05 % emollient cream Apply 1 application topically 2 (two) times daily as needed (insect bite). 60 g 1   colchicine (COLCRYS) 0.6 MG tablet Take one tablet for 4-6 days as needed for gout flare up. 15 tablet 3   dapagliflozin propanediol (FARXIGA) 10 MG TABS tablet TAKE 1 TABLET BY MOUTH ONCE DAILY 90 tablet 3   fish oil-omega-3 fatty acids 1000 MG capsule Take 1 g by mouth daily.     FLUZONE HIGH-DOSE QUADRIVALENT 0.7 ML SUSY      furosemide (LASIX) 80 MG tablet Take 1 tablet (80 mg total) by mouth daily. As needed patient may take an additional 40 MG tablet as direct per [employer] study PRN plan. 90 tablet 3   gabapentin (NEURONTIN) 100 MG capsule Take 2 capsules (200 mg total) by mouth at bedtime. 180  capsule 3   GAVILYTE-G 236 g solution Take 4,000 mLs by mouth once.     glucose blood (ACCU-CHEK GUIDE) test strip Use as instructed to check blood sugar 1-2 times daily 200 each 3   hydrALAZINE (APRESOLINE) 25 MG tablet TAKE 1 TABLET BY MOUTH THREE TIMES DAILY 270 tablet 3   HYDROcodone-acetaminophen (NORCO) 10-325 MG tablet Take 1 tablet by mouth every 8 (eight) hours as needed for moderate pain or severe pain. 90 tablet 0   hydrocortisone 2.5 % ointment Apply topically 2 (two) times daily as needed. 30 g 1   isosorbide mononitrate (IMDUR) 30 MG 24 hr tablet Take 1 tablet (30 mg total) by mouth daily. 30 tablet 6   LORazepam (ATIVAN) 1 MG tablet Take 1 tablet (1 mg total) by mouth every 8 (eight) hours as needed. for anxiety 30 tablet 5   magnesium oxide (MAG-OX) 400 MG tablet Take 1 tablet (400 mg total) by mouth 2 (two) times daily. 30 tablet 11   metoprolol succinate (TOPROL-XL) 50 MG 24 hr tablet TAKE 1 TABLET BY MOUTH TWICE DAILY WITH A MEAL OR  IMMEDIATELY  FOLLOWING  A  MEAL 180 tablet 2   Multiple Vitamin (MULTIVITAMIN WITH MINERALS) TABS tablet Take 1 tablet by mouth daily.      PFIZER COVID-19 VAC BIVALENT injection      Polyethyl Glycol-Propyl Glycol (SYSTANE OP) Place 1 drop into both eyes daily as needed (dry eyes).     potassium chloride SA (KLOR-CON M20) 20 MEQ tablet Take 1 tablet (20 mEq total) by mouth as directed. 1 tablet daily 20 mEq by mouth as directed per alleviate research protocol. 90 tablet 3   pravastatin (PRAVACHOL) 40 MG tablet Take 1 tablet (40 mg total) by mouth daily. 90 tablet 3   Semaglutide,0.25 or 0.5MG /DOS, (OZEMPIC, 0.25 OR 0.5 MG/DOSE,) 2 MG/1.5ML SOPN Inject 0.5 mg into the skin once a week.  4.5 mL 3   tamsulosin (FLOMAX) 0.4 MG CAPS capsule Take 1 capsule by mouth once daily 30 capsule 0   temazepam (RESTORIL) 30 MG capsule Take 1 capsule (30 mg total) by mouth at bedtime. 30 capsule 5   tiZANidine (ZANAFLEX) 2 MG tablet Take 0.5-1 tablets (1-2 mg total)  by mouth at bedtime as needed for muscle spasms. 3020 tablet 1   VITAMIN D PO Take by mouth.     white petrolatum (VASELINE) GEL See admin instructions.     naproxen (NAPROSYN) 375 MG tablet Take 375 mg by mouth 2 (two) times daily as needed (knee pain.).      No facility-administered medications prior to visit.    Allergies  Allergen Reactions   Sulfonamide Derivatives Other (See Comments)    Flu like symptoms    Zolpidem Other (See Comments)    Excessive, prolonged sedation    Review of Systems  Constitutional:  Negative for fever and malaise/fatigue.  HENT:  Negative for congestion.   Eyes:  Negative for redness.  Respiratory:  Negative for shortness of breath.   Cardiovascular:  Negative for chest pain, palpitations and leg swelling.  Gastrointestinal:  Negative for abdominal pain, blood in stool and nausea.  Genitourinary:  Negative for dysuria and frequency.  Musculoskeletal:  Negative for falls.  Skin:  Negative for rash.  Neurological:  Negative for dizziness, loss of consciousness and headaches.  Endo/Heme/Allergies:  Negative for polydipsia.  Psychiatric/Behavioral:  Negative for depression. The patient is not nervous/anxious.       Objective:    Physical Exam Constitutional:      Appearance: Normal appearance. He is not ill-appearing.  HENT:     Head: Normocephalic and atraumatic.     Right Ear: Tympanic membrane, ear canal and external ear normal.     Left Ear: Tympanic membrane, ear canal and external ear normal.  Eyes:     Conjunctiva/sclera: Conjunctivae normal.  Cardiovascular:     Rate and Rhythm: Normal rate and regular rhythm.     Heart sounds: Murmur heard.  Systolic murmur is present with a grade of 3/6.  Pulmonary:     Breath sounds: Normal breath sounds. No wheezing.  Abdominal:     General: Bowel sounds are normal. There is no distension.     Palpations: Abdomen is soft.     Tenderness: There is no abdominal tenderness.     Hernia: No hernia  is present.  Musculoskeletal:     Cervical back: Neck supple.  Lymphadenopathy:     Cervical: No cervical adenopathy.  Skin:    General: Skin is warm and dry.  Neurological:     Mental Status: He is alert and oriented to person, place, and time.  Psychiatric:        Behavior: Behavior normal.    BP 118/70 (BP Location: Left Arm, Patient Position: Sitting, Cuff Size: Normal)   Pulse 82   Resp 20   Ht _0  (1.803 m)   Wt 184 lb 6.4 oz (83.6 kg)   SpO2 94%   BMI 25.72 kg/m  Wt Readings from Last 3 Encounters:  11/03/21 184 lb 6.4 oz (83.6 kg)  10/29/21 184 lb 9.6 oz (83.7 kg)  09/15/21 187 lb 6.4 oz (85 kg)    Diabetic Foot Exam - Simple   No data filed    Lab Results  Component Value Date   WBC 3.7 05/13/2021   HGB 14.4 05/13/2021   HCT 46.0 05/13/2021   PLT 171  05/13/2021   GLUCOSE 111 (H) 07/07/2021   CHOL 119 03/06/2021   TRIG 123.0 03/06/2021   HDL 34.60 (L) 03/06/2021   LDLDIRECT 84.0 05/02/2018   LDLCALC 60 03/06/2021   ALT 13 07/07/2021   AST 16 07/07/2021   NA 142 07/07/2021   K 4.6 07/07/2021   CL 102 07/07/2021   CREATININE 1.80 (H) 07/07/2021   BUN 31 (H) 07/07/2021   CO2 34 (H) 07/07/2021   TSH 2.16 07/07/2021   PSA 10.76 (H) 02/06/2019   INR 2.2 09/06/2018   HGBA1C 5.8 (A) 10/29/2021   MICROALBUR 1.1 04/14/2015    Lab Results  Component Value Date   TSH 2.16 07/07/2021   Lab Results  Component Value Date   WBC 3.7 05/13/2021   HGB 14.4 05/13/2021   HCT 46.0 05/13/2021   MCV 84 05/13/2021   PLT 171 05/13/2021   Lab Results  Component Value Date   NA 142 07/07/2021   K 4.6 07/07/2021   CO2 34 (H) 07/07/2021   GLUCOSE 111 (H) 07/07/2021   BUN 31 (H) 07/07/2021   CREATININE 1.80 (H) 07/07/2021   BILITOT 0.6 07/07/2021   ALKPHOS 45 07/07/2021   AST 16 07/07/2021   ALT 13 07/07/2021   PROT 7.4 07/07/2021   ALBUMIN 4.4 07/07/2021   CALCIUM 10.0 07/07/2021   ANIONGAP 10 11/26/2019   EGFR 47 (L) 05/13/2021   GFR 38.20 (L)  07/07/2021   Lab Results  Component Value Date   CHOL 119 03/06/2021   Lab Results  Component Value Date   HDL 34.60 (L) 03/06/2021   Lab Results  Component Value Date   LDLCALC 60 03/06/2021   Lab Results  Component Value Date   TRIG 123.0 03/06/2021   Lab Results  Component Value Date   CHOLHDL 3 03/06/2021   Lab Results  Component Value Date   HGBA1C 5.8 (A) 10/29/2021       Assessment & Plan:   Problem List Items Addressed This Visit     Diabetes mellitus type 2 in obese ([place])    hgba1c acceptable, minimize simple carbs. Increase exercise as tolerated. Continue current meds       Relevant Orders   Microalbumin / creatinine urine ratio   Anemia   Relevant Orders   CBC   Essential hypertension - Primary    Well controlled, no changes to meds. Encouraged heart healthy diet such as the DASH diet and exercise as tolerated.        Relevant Orders   TSH   Lipid panel   Comprehensive metabolic panel   Chronic combined systolic and diastolic heart failure (HCC)    Continues to improve and follows with chf clinic at [facility]       Hyperlipidemia   Relevant Orders   Lipid panel   Gout    No recent flares. Hydrate and monitor       Relevant Orders   Uric acid   Insomnia    Encouraged good sleep hygiene such as dark, quiet room. No blue/green glowing lights such as computer screens in bedroom. No alcohol or stimulants in evening. Cut down on caffeine as able. Regular exercise is helpful but not just prior to bed time. Doing well on Temazepam no changes       Chronic renal insufficiency    Hydrate and monitor        No orders of the defined types were placed in this encounter.   I,[name],acting as a Education administrator for [employer]  Charlett Blake, MD.,have documented all relevant documentation on the behalf of Penni Homans, MD,as directed by  Penni Homans, MD while in the presence of Penni Homans, MD.   I, Kevin Lukes, MD. , personally preformed the services  described in this documentation.  All medical record entries made by the scribe were at my direction and in my presence.  I have reviewed the chart and discharge instructions (if applicable) and agree that the record reflects my personal performance and is accurate and complete. 11/03/2021

## 2021-11-10 DIAGNOSIS — Z006 Encounter for examination for normal comparison and control in clinical research program: Secondary | ICD-10-CM

## 2021-11-10 NOTE — Research (Signed)
Alleviate HF Research Study 30 Month  No adverse events or medication changes to report.  NYHA l   Current Outpatient Medications:    Accu-Chek Softclix Lancets lancets, Use as instructed, Disp: 200 each, Rfl: 3   albuterol (VENTOLIN HFA) 108 (90 Base) MCG/ACT inhaler, Inhale 2 puffs into the lungs every 6 (six) hours as needed for wheezing or shortness of breath., Disp: 1 Inhaler, Rfl: 6   Alcohol Swabs (DROPSAFE ALCOHOL PREP) 70 % PADS, USE AS DIRECTED, Disp: 300 each, Rfl: 1   allopurinol (ZYLOPRIM) 300 MG tablet, Take 1 tablet by mouth once daily, Disp: 90 tablet, Rfl: 0   apixaban (ELIQUIS) 5 MG TABS tablet, Take 1 tablet (5 mg total) by mouth 2 (two) times daily., Disp: 180 tablet, Rfl: 3   Blood Glucose Monitoring Suppl (ACCU-CHEK GUIDE ME) w/Device KIT, Use as instructed to check blood sugar 1-2 times daily., Disp: 1 kit, Rfl: 0   calcium carbonate (TUMS - DOSED IN MG ELEMENTAL CALCIUM) 500 MG chewable tablet, Chew 500 mg by mouth daily as needed for indigestion or heartburn., Disp: , Rfl:    cetirizine (ZYRTEC) 10 MG tablet, Take 1 tablet (10 mg total) by mouth daily., Disp: 30 tablet, Rfl: 11   clobetasol ointment (TEMOVATE) 8.61 %, 1 application to affected area, Disp: , Rfl:    Clobetasol Prop Emollient Base (CLOBETASOL PROPIONATE E) 0.05 % emollient cream, Apply 1 application topically 2 (two) times daily as needed (insect bite)., Disp: 60 g, Rfl: 1   colchicine (COLCRYS) 0.6 MG tablet, Take one tablet for 4-6 days as needed for gout flare up., Disp: 15 tablet, Rfl: 3   dapagliflozin propanediol (FARXIGA) 10 MG TABS tablet, TAKE 1 TABLET BY MOUTH ONCE DAILY, Disp: 90 tablet, Rfl: 3   fish oil-omega-3 fatty acids 1000 MG capsule, Take 1 g by mouth daily., Disp: , Rfl:    FLUZONE HIGH-DOSE QUADRIVALENT 0.7 ML SUSY, , Disp: , Rfl:    furosemide (LASIX) 80 MG tablet, Take 1 tablet (80 mg total) by mouth daily. As needed patient may take an additional 40 MG tablet as direct per  Alleviate Research study PRN plan., Disp: 90 tablet, Rfl: 3   gabapentin (NEURONTIN) 100 MG capsule, Take 2 capsules (200 mg total) by mouth at bedtime., Disp: 180 capsule, Rfl: 3   GAVILYTE-G 236 g solution, Take 4,000 mLs by mouth once., Disp: , Rfl:    glucose blood (ACCU-CHEK GUIDE) test strip, Use as instructed to check blood sugar 1-2 times daily, Disp: 200 each, Rfl: 3   hydrALAZINE (APRESOLINE) 25 MG tablet, TAKE 1 TABLET BY MOUTH THREE TIMES DAILY, Disp: 270 tablet, Rfl: 3   HYDROcodone-acetaminophen (NORCO) 10-325 MG tablet, Take 1 tablet by mouth every 8 (eight) hours as needed for moderate pain or severe pain., Disp: 90 tablet, Rfl: 0   hydrocortisone 2.5 % ointment, Apply topically 2 (two) times daily as needed., Disp: 30 g, Rfl: 1   isosorbide mononitrate (IMDUR) 30 MG 24 hr tablet, Take 1 tablet (30 mg total) by mouth daily., Disp: 30 tablet, Rfl: 6   LORazepam (ATIVAN) 1 MG tablet, Take 1 tablet (1 mg total) by mouth every 8 (eight) hours as needed. for anxiety, Disp: 30 tablet, Rfl: 5   magnesium oxide (MAG-OX) 400 MG tablet, Take 1 tablet (400 mg total) by mouth 2 (two) times daily., Disp: 30 tablet, Rfl: 11   metoprolol succinate (TOPROL-XL) 50 MG 24 hr tablet, TAKE 1 TABLET BY MOUTH TWICE DAILY WITH A MEAL  OR  IMMEDIATELY  FOLLOWING  A  MEAL, Disp: 180 tablet, Rfl: 2   Multiple Vitamin (MULTIVITAMIN WITH MINERALS) TABS tablet, Take 1 tablet by mouth daily. , Disp: , Rfl:    PFIZER COVID-19 VAC BIVALENT injection, , Disp: , Rfl:    Polyethyl Glycol-Propyl Glycol (SYSTANE OP), Place 1 drop into both eyes daily as needed (dry eyes)., Disp: , Rfl:    potassium chloride SA (KLOR-CON M20) 20 MEQ tablet, Take 1 tablet (20 mEq total) by mouth as directed. 1 tablet daily 20 mEq by mouth as directed per alleviate research protocol., Disp: 90 tablet, Rfl: 3   pravastatin (PRAVACHOL) 40 MG tablet, Take 1 tablet (40 mg total) by mouth daily., Disp: 90 tablet, Rfl: 3   Semaglutide,0.25 or  0.5MG/DOS, (OZEMPIC, 0.25 OR 0.5 MG/DOSE,) 2 MG/1.5ML SOPN, Inject 0.5 mg into the skin once a week., Disp: 4.5 mL, Rfl: 3   tamsulosin (FLOMAX) 0.4 MG CAPS capsule, Take 1 capsule by mouth once daily, Disp: 30 capsule, Rfl: 0   temazepam (RESTORIL) 30 MG capsule, Take 1 capsule (30 mg total) by mouth at bedtime., Disp: 30 capsule, Rfl: 5   tiZANidine (ZANAFLEX) 2 MG tablet, Take 0.5-1 tablets (1-2 mg total) by mouth at bedtime as needed for muscle spasms., Disp: 3020 tablet, Rfl: 1   VITAMIN D PO, Take by mouth., Disp: , Rfl:    white petrolatum (VASELINE) GEL, See admin instructions., Disp: , Rfl:

## 2021-11-11 MED ORDER — POTASSIUM CHLORIDE CRYS ER 20 MEQ PO TBCR: EXTENDED_RELEASE_TABLET | ORAL | 3 refills | Status: DC

## 2021-11-11 NOTE — Addendum Note (Signed)
Addended by: Rubie Maid B on: 11/11/2021 03:34 PM   Modules accepted: Orders

## 2021-11-28 ENCOUNTER — Other Ambulatory Visit: Payer: Self-pay | Admitting: Family Medicine

## 2021-12-10 ENCOUNTER — Other Ambulatory Visit (HOSPITAL_COMMUNITY): Payer: Self-pay | Admitting: Internal Medicine

## 2021-12-11 ENCOUNTER — Other Ambulatory Visit (HOSPITAL_COMMUNITY): Payer: Self-pay

## 2021-12-11 MED ORDER — ISOSORBIDE MONONITRATE ER 30 MG PO TB24: 30.0000 mg | ORAL_TABLET | Freq: Every day | ORAL | 6 refills | Status: DC

## 2021-12-21 ENCOUNTER — Other Ambulatory Visit: Payer: Self-pay | Admitting: Cardiovascular Disease

## 2021-12-21 DIAGNOSIS — Z006 Encounter for examination for normal comparison and control in clinical research program: Secondary | ICD-10-CM

## 2021-12-21 DIAGNOSIS — I480 Paroxysmal atrial fibrillation: Secondary | ICD-10-CM

## 2021-12-21 NOTE — Research (Addendum)
Alleviate HF Research Study Investigator Action   Medications reviewed and action taken:   -'[]'$  Medication changes made                       -'[x]'$  No medication change     **Patient is asymptomatic and rarely has hi-risk reports**                  -'[]'$  PRN medication intervention changes

## 2021-12-22 NOTE — Telephone Encounter (Signed)
Eliquis '5mg'$  refill request received. Patient is 71 years old, weight-83.6kg, Crea-1.94 on 11/03/2021, Diagnosis-Afib, and last seen by Dr. Curt Bears on 09/15/2021. Dose is appropriate based on dosing criteria. Will send in refill to requested pharmacy.

## 2022-01-01 ENCOUNTER — Ambulatory Visit: Payer: Medicare HMO | Admitting: Cardiovascular Disease

## 2022-01-01 ENCOUNTER — Encounter: Payer: Self-pay | Admitting: Cardiovascular Disease

## 2022-01-01 VITALS — BP 98/60 | HR 76 | Ht 71.0 in | Wt 184.4 lb

## 2022-01-01 DIAGNOSIS — G4733 Obstructive sleep apnea (adult) (pediatric): Secondary | ICD-10-CM

## 2022-01-01 DIAGNOSIS — I480 Paroxysmal atrial fibrillation: Secondary | ICD-10-CM | POA: Diagnosis not present

## 2022-01-01 DIAGNOSIS — D6869 Other thrombophilia: Secondary | ICD-10-CM

## 2022-01-01 DIAGNOSIS — N1832 Chronic kidney disease, stage 3b: Secondary | ICD-10-CM | POA: Diagnosis not present

## 2022-01-01 DIAGNOSIS — Z95818 Presence of other cardiac implants and grafts: Secondary | ICD-10-CM

## 2022-01-01 DIAGNOSIS — E1142 Type 2 diabetes mellitus with diabetic polyneuropathy: Secondary | ICD-10-CM

## 2022-01-01 DIAGNOSIS — E663 Overweight: Secondary | ICD-10-CM

## 2022-01-01 DIAGNOSIS — I351 Nonrheumatic aortic (valve) insufficiency: Secondary | ICD-10-CM

## 2022-01-01 DIAGNOSIS — I10 Essential (primary) hypertension: Secondary | ICD-10-CM | POA: Diagnosis not present

## 2022-01-01 DIAGNOSIS — I5042 Chronic combined systolic (congestive) and diastolic (congestive) heart failure: Secondary | ICD-10-CM | POA: Diagnosis not present

## 2022-01-01 NOTE — Progress Notes (Signed)
Cardiology Office Note    Date:  01/01/2022   ID:  Kevin, [name] Oct 20, 1950, MRN [id number]  PCP:  Kevin Lukes, MD  Cardiologist:   Kevin Klein, MD   Chief Complaint  Patient presents with   Congestive Heart Failure    History of Present Illness:  [name] is a 71 y.o. male with combined systolic and diastolic heart failure, history of paroxysmal atrial flutter and paroxysmal atrial fibrillation status post re-do endocardial ablation (Dr. Curt [name], August 2021), history of recurrent tachycardia cardiomyopathy, moderate aortic insufficiency, longstanding severe obstructive sleep apnea compliant with CPAP, essential hypertension, dyslipidemia, type 2 diabetes mellitus, history of obesity with substantial success at weight loss.  He continues to do quite well.  He has not had any problems with worsening exertional dyspnea, orthopnea, PND or edema, but he did recently adjust his diuretic dose for 3 pound weight gain that resolved promptly.  He keeps busy with carpentry, building a deck, but is avoiding the heat of the middle of the day.  He has not had any chest pain.  He denies dizziness, palpitations or syncope.  He has not had any falls, injuries or bleeding problems and is compliant with Eliquis anticoagulation.  Most recent assessment of LVEF was 45-50%.  Metabolic control is good.  Most recent hemoglobin A1c was excellent at 5.8%.  LDL was 88 (he does not have CAD or PAD), HDL 41 and triglycerides 87.  His most recent hemoglobin was 13.4.  Creatinine is 1.94 which is close to his baseline.  Electrolytes were normal.  Sees [name], nephrology in [place].   He is not on RAAS inhibitors due to renal dysfunction, but is on hydralazine/nitrates, SGLT2 inhibitor, beta-blockers.  Follow-up echocardiogram performed on July 20, 2019 showed EF back to "baseline" 35-40%.  The aortic insufficiency was described as mild to moderate, ascending aorta 43 mm.  The left  atrium was severely dilated.  The left ventricle is normal in size with mild concentric hypertrophy.  TEE performed just before his last ablation procedure on 03/07/2020 showed a similar EF of 35-40%.  The left atrium severely dilated, without thrombus.  There was only mild mitral regurgitation.  Aortic regurgitation was also described as mild.  The aortic root was mildly dilated at 41 mm.  He was very ill at the end of 2020 with severe tachycardia related cardiomyopathy, severely depressed left ventricular systolic function in the setting of atrial flutter with rapid ventricular rates.  He underwent ablation with [name], transition from dofetilide to amiodarone and then was hospitalized for optimization of his heart failure.  A loop recorder was implanted as part of a CHF trial.  He has never really been aware of the palpitations, so episodes of breakthrough arrhythmia have often gone undetected in the past.  Because of a 2-week episode of persistent atrial fibrillation detected by his loop recorder, he underwent cardioversion successfully on March 11.  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  Most recently in December 2020 he had cardiogenic shock and an EF of 10%, underwent pulmonary vein isolation and had a prolonged hospitalization on pressors.  Follow-up echo in February 2021 showed EF of 35-40%.  Prior to that in January 2019 his ejection fraction dropped to 30-35% when he was again unaware of the persistent arrhythmia.  He underwent cardioversion and ranolazine was added to dofetilide with a return to sinus rhythm, improved functional status and improvement in left ventricular ejection fraction to  42% by echo performed in April 2019  In early 2020 he had an upper respiratory infection from which he never felt that he fully rebounded.  He noticed persistent tachycardia throughout the summer 2020 he underwent successful cardioversion on February 27, 2019  but had early recurrence of atrial fibrillation despite increased dose of dofetilide.  His clinical status steadily deteriorated.  On May 16, 2019 TEE showed LVEF was down to 10%, with severe biatrial dilation and moderate aortic insufficiency, mild mitral insufficiency.  The same day he underwent A. fib/a flutter ablation by Dr. Curt [name] and a right heart catheterization by Dr. Haroldine [name] (RA pressure 15, PA pressure 49/32, wedge pressure mean 27, cardiac index 2.8).  Follow-up echocardiogram in February 2021 showed EF of 35-40%.  He did have another cardioversion in early March for persistent atrial flutter.  His echocardiograms have shown moderate aortic insufficiency related to aortic ectasia.  He has a long-standing history of severe hypertension. He also has long-standing history of obstructive sleep apnea which was also not treated until recently, but he is now 100% CPAP-compliant. Coronary angiography 2011 did not show evidence of significant stenoses. He presented with typical atrial flutter in 2011 and has recurrent paroxysmal atrial fibrillation with a good response to treatment with dofetilide, but the dose of dofetilide was decreased when his renal function deteriorated in 2020. Additional problems include obesity, type 2 diabetes mellitus, gout and androgen deficiency. He had a successful cardioversion in October 2016 and February 2019 for persistent atrial fibrillation.   Past Medical History:  Diagnosis Date   AORTIC STENOSIS, MODERATE 12/05/2009   Arthritis    Atrial fibrillation (HCC)    Atrial fibrillation with RVR ([place]) 07/05/2017   Atrial flutter ([place]) 12/05/2009   Atypical chest pain 11/08/2011   BENIGN PROSTATIC HYPERTROPHY, HX OF 12/05/2009   CHEST PAIN-UNSPECIFIED 11/11/2009   CHF 12/05/2009   Chronic renal insufficiency 12/16/2016   Debility 04/18/2012   Diarrhea 12/16/2016   DYSPNEA 12/19/2009   Edema 04/18/2012   ERECTILE DYSFUNCTION 01/25/2007   FATIGUE, CHRONIC  11/11/2009   Gout 07/10/2012   Heart murmur    History of kidney stones    "they passed" (07/05/2017)   Hyperkalemia 04/18/2012   Hyperlipidemia    Hypertension    HYPERTENSION 01/25/2007   Hypoxia 05/05/2011   Insomnia 05/13/2016   INSOMNIA, HX OF 01/25/2007   Knee pain, bilateral 12/28/2010   Knee pain, right 12/28/2010   NEUROMA 01/05/2010   OBESITY NOS 01/25/2007   OSA on CPAP 04/06/2010   PERIPHERAL NEUROPATHY 01/05/2010   PULMONARY FUNCTION TESTS, ABNORMAL 02/02/2010   Sleep apnea    wears CPAP   Superficial thrombophlebitis of left leg 09/07/2011   TESTICULAR HYPOFUNCTION 01/05/2010   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   Toe pain, right 07/10/2012   Type II diabetes mellitus ([place]) 12/05/2009   UNSPECIFIED ANEMIA 01/05/2010    Past Surgical History:  Procedure Laterality Date   A-FLUTTER ABLATION N/A 05/16/2019   Procedure: A-FLUTTER ABLATION;  Surgeon: Constance Haw, MD;  Location: [facility];  Service: Cardiovascular;  Laterality: N/A;   ATRIAL FIBRILLATION ABLATION N/A 05/16/2019   Procedure: ATRIAL FIBRILLATION ABLATION;  Surgeon: Constance Haw, MD;  Location: [facility];  Service: Cardiovascular;  Laterality: N/A;   ATRIAL FIBRILLATION ABLATION N/A 03/10/2020   Procedure: ATRIAL FIBRILLATION ABLATION;  Surgeon: Constance Haw, MD;  Location: [facility];  Service: Cardiovascular;  Laterality: N/A;   CARDIAC CATHETERIZATION  10/16/2009   nonischemic cardiomyopathy  CARDIOVERSION N/A 03/25/2015   Procedure: CARDIOVERSION;  Surgeon: Pixie Casino, MD;  Location: [facility];  Service: Cardiovascular;  Laterality: N/A;   CARDIOVERSION N/A 06/17/2017   Procedure: CARDIOVERSION;  Surgeon: Kevin Klein, MD;  Location: [facility];  Service: Cardiovascular;  Laterality: N/A;   CARDIOVERSION N/A 07/08/2017   Procedure: CARDIOVERSION;  Surgeon: Fay Records, MD;  Location: [facility];  Service: Cardiovascular;  Laterality: N/A;    CARDIOVERSION N/A 02/27/2019   Procedure: CARDIOVERSION;  Surgeon: Kevin Klein, MD;  Location: [facility];  Service: Cardiovascular;  Laterality: N/A;   CARDIOVERSION N/A 08/16/2019   Procedure: CARDIOVERSION;  Surgeon: Kevin Klein, MD;  Location: [facility];  Service: Cardiovascular;  Laterality: N/A;   COLONOSCOPY  03/10/2021   1st colonoscopy , Wilfrid Lund, [facility], large transverse polyp, clip and spot applied   METATARSAL OSTEOTOMY Bilateral ~ 1980   removed part of 5th metatarsal to corect curvature of toes    RIGHT HEART CATH N/A 05/16/2019   Procedure: RIGHT HEART CATH;  Surgeon: Jolaine Artist, MD;  Location: [facility];  Service: Cardiovascular;  Laterality: N/A;   TEE WITHOUT CARDIOVERSION  05/16/2019   Procedure: Transesophageal Echocardiogram (Tee);  Surgeon: Constance Haw, MD;  Location: [facility];  Service: Cardiovascular;;   TEE WITHOUT CARDIOVERSION N/A 03/07/2020   Procedure: TRANSESOPHAGEAL ECHOCARDIOGRAM (TEE);  Surgeon: Freada Bergeron, MD;  Location: [facility];  Service: Cardiovascular;  Laterality: N/A;    Current Medications: Outpatient Medications Prior to Visit  Medication Sig Dispense Refill   Accu-Chek Softclix Lancets lancets Use as instructed 200 each 3   Alcohol Swabs (DROPSAFE ALCOHOL PREP) 70 % PADS USE AS DIRECTED 300 each 1   allopurinol (ZYLOPRIM) 300 MG tablet Take 1 tablet by mouth once daily 90 tablet 0   apixaban (ELIQUIS) 5 MG TABS tablet Take 1 tablet by mouth twice daily 60 tablet 5   Blood Glucose Monitoring Suppl (ACCU-CHEK GUIDE ME) w/Device KIT Use as instructed to check blood sugar 1-2 times daily. 1 kit 0   cetirizine (ZYRTEC) 10 MG tablet Take 1 tablet (10 mg total) by mouth daily. 30 tablet 11   clobetasol ointment (TEMOVATE) 7.61 % 1 application to affected area     Clobetasol Prop Emollient Base (CLOBETASOL PROPIONATE E) 0.05 % emollient cream Apply 1 application topically 2 (two) times  daily as needed (insect bite). 60 g 1   colchicine (COLCRYS) 0.6 MG tablet Take one tablet for 4-6 days as needed for gout flare up. 15 tablet 3   dapagliflozin propanediol (FARXIGA) 10 MG TABS tablet TAKE 1 TABLET BY MOUTH ONCE DAILY 90 tablet 3   fish oil-omega-3 fatty acids 1000 MG capsule Take 1 g by mouth daily.     FLUZONE HIGH-DOSE QUADRIVALENT 0.7 ML SUSY      furosemide (LASIX) 80 MG tablet Take 1 tablet (80 mg total) by mouth daily. As needed patient may take an additional 40 MG tablet as direct per [employer] study PRN plan. 90 tablet 3   glucose blood (ACCU-CHEK GUIDE) test strip Use as instructed to check blood sugar 1-2 times daily 200 each 3   hydrALAZINE (APRESOLINE) 25 MG tablet TAKE 1 TABLET BY MOUTH THREE TIMES DAILY 270 tablet 3   HYDROcodone-acetaminophen (NORCO) 10-325 MG tablet Take 1 tablet by mouth every 8 (eight) hours as needed for moderate pain or severe pain. 90 tablet 0   hydrocortisone 2.5 % ointment Apply topically 2 (two) times daily as needed. [place]  g 1   isosorbide mononitrate (IMDUR) 30 MG 24 hr tablet Take 1 tablet by mouth once daily 90 tablet 0   magnesium oxide (MAG-OX) 400 MG tablet Take 1 tablet (400 mg total) by mouth 2 (two) times daily. 30 tablet 11   metoprolol succinate (TOPROL-XL) 50 MG 24 hr tablet TAKE 1 TABLET BY MOUTH TWICE DAILY WITH A MEAL OR  IMMEDIATELY  FOLLOWING  A  MEAL 180 tablet 2   Multiple Vitamin (MULTIVITAMIN WITH MINERALS) TABS tablet Take 1 tablet by mouth daily.      potassium chloride SA (KLOR-CON M20) 20 MEQ tablet Take 1 tablet (20 mEq total) by mouth as directed. 1 tablet daily 20 mEq by mouth as directed per alleviate research protocol. 90 tablet 3   pravastatin (PRAVACHOL) 40 MG tablet Take 1 tablet (40 mg total) by mouth daily. 90 tablet 3   Semaglutide,0.25 or 0.5MG /DOS, (OZEMPIC, 0.25 OR 0.5 MG/DOSE,) 2 MG/1.5ML SOPN Inject 0.5 mg into the skin once a week. 4.5 mL 3   tamsulosin (FLOMAX) 0.4 MG CAPS capsule Take 1  capsule by mouth once daily 30 capsule 0   temazepam (RESTORIL) 30 MG capsule Take 1 capsule (30 mg total) by mouth at bedtime. 30 capsule 5   VITAMIN D PO Take by mouth.     white petrolatum (VASELINE) GEL See admin instructions.     albuterol (VENTOLIN HFA) 108 (90 Base) MCG/ACT inhaler Inhale 2 puffs into the lungs every 6 (six) hours as needed for wheezing or shortness of breath. (Patient not taking: Reported on 01/01/2022) 1 Inhaler 6   calcium carbonate (TUMS - DOSED IN MG ELEMENTAL CALCIUM) 500 MG chewable tablet Chew 500 mg by mouth daily as needed for indigestion or heartburn. (Patient not taking: Reported on 01/01/2022)     gabapentin (NEURONTIN) 100 MG capsule Take 2 capsules (200 mg total) by mouth at bedtime. (Patient not taking: Reported on 01/01/2022) 180 capsule 3   GAVILYTE-G 236 g solution Take 4,000 mLs by mouth once. (Patient not taking: Reported on 01/01/2022)     isosorbide mononitrate (IMDUR) 30 MG 24 hr tablet Take 1 tablet (30 mg total) by mouth daily. 30 tablet 6   LORazepam (ATIVAN) 1 MG tablet Take 1 tablet (1 mg total) by mouth every 8 (eight) hours as needed. for anxiety (Patient not taking: Reported on 01/01/2022) 30 tablet 5   PFIZER COVID-19 VAC BIVALENT injection  (Patient not taking: Reported on 01/01/2022)     Polyethyl Glycol-Propyl Glycol (SYSTANE OP) Place 1 drop into both eyes daily as needed (dry eyes). (Patient not taking: Reported on 01/01/2022)     potassium chloride SA (KLOR-CON M) 20 MEQ tablet Patient may take 1 tablet daily 20 mEq by mouth as directed per alleviate research protocol x 4 days (Patient not taking: Reported on 01/01/2022) 90 tablet 3   tiZANidine (ZANAFLEX) 2 MG tablet Take 0.5-1 tablets (1-2 mg total) by mouth at bedtime as needed for muscle spasms. (Patient not taking: Reported on 01/01/2022) 3020 tablet 1   No facility-administered medications prior to visit.     Allergies:   Sulfonamide derivatives and Zolpidem   Social History    Socioeconomic History   Marital status: Married    Spouse name: Not on file   Number of children: 4   Years of education: 12   Highest education level: High school graduate  Occupational History   Occupation: retired    Fish farm manager: [employer]  Tobacco Use   Smoking status: Former  Packs/day: 1.00    Years: 10.00    Total pack years: 10.00    Types: Cigarettes    Quit date: 06/07/1980    Years since quitting: 41.5   Smokeless tobacco: Never  Vaping Use   Vaping Use: Never used  Substance and Sexual Activity   Alcohol use: No   Drug use: No   Sexual activity: Not Currently  Other Topics Concern   Not on file  Social History Narrative   Lives with male partner in a one story home.  His son lives there off and on.  Retired from [employer].  Education: high school.  Right handed   Social Determinants of Health   Financial Resource Strain: Not on file  Food Insecurity: Not on file  Transportation Needs: Not on file  Physical Activity: Not on file  Stress: Not on file  Social Connections: Not on file     Family History:  The patient's family history includes Aneurysm in his sister; Cancer in his father; Clotting disorder in his mother; Diabetes in his mother and sister; Heart attack in his mother; Heart disease in his mother; Hyperlipidemia in his mother; Hypertension in his mother and sister; Leukemia in his maternal grandmother; Lung disease in his father; Other in his sister; Seizures in his sister; Stroke in his mother.   ROS:   Please see the history of present illness.    ROS All other systems are reviewed and are negative.  PHYSICAL EXAM:   VS:  BP 98/60 (BP Location: Left Arm, Patient Position: Sitting, Cuff Size: Normal)   Pulse 76   Ht _0  (1.803 m)   Wt 184 lb 6.4 oz (83.6 kg)   SpO2 97%   BMI 25.72 kg/m      General: Alert, oriented x3, no distress, minimally overweight Head: no evidence of trauma, PERRL, EOMI, no exophtalmos or lid lag, no myxedema,  no xanthelasma; normal ears, nose and oropharynx Neck: normal jugular venous pulsations and no hepatojugular reflux; brisk carotid pulses without delay and no carotid bruits Chest: clear to auscultation, no signs of consolidation by percussion or palpation, normal fremitus, symmetrical and full respiratory excursions Cardiovascular: normal position and quality of the apical impulse, regular rhythm, normal first and second heart sounds, 2/6 systolic ejection murmur and 2/6 diastolic aortic insufficiency murmur are heard up and down both sides of the sternum, no rubs or gallops Abdomen: no tenderness or distention, no masses by palpation, no abnormal pulsatility or arterial bruits, normal bowel sounds, no hepatosplenomegaly Extremities: no clubbing, cyanosis or edema; 2+ radial, ulnar and brachial pulses bilaterally; 2+ right femoral, posterior tibial and dorsalis pedis pulses; 2+ left femoral, posterior tibial and dorsalis pedis pulses; no subclavian or femoral bruits Neurological: grossly nonfocal Psych: Normal mood and affect   Wt Readings from Last 3 Encounters:  01/01/22 184 lb 6.4 oz (83.6 kg)  11/03/21 184 lb 6.4 oz (83.6 kg)  10/29/21 184 lb 9.6 oz (83.7 kg)   Studies/Labs Reviewed:   ECHO 07/21/2021:  1. Left ventricular ejection fraction, by estimation, is 45 to 50%. The  left ventricle has mildly decreased function. The left ventricle demonstrates global hypokinesis. The left ventricular internal cavity size was mildly dilated. There is mild left ventricular hypertrophy. Left ventricular diastolic parameters are indeterminate.   2. Right ventricular systolic function is mildly reduced. The right ventricular size is mildly enlarged. There is moderately elevated pulmonary artery systolic pressure.   3. Left atrial size was moderately dilated.   4.  Right atrial size was mildly dilated.   5. The mitral valve is abnormal. Mild mitral valve regurgitation. No evidence of mitral stenosis.    6. Tricuspid valve regurgitation is moderate.   7. The aortic valve is tricuspid. There is mild calcification of the aortic valve. Aortic valve regurgitation is moderate. Aortic valve sclerosis is present, with no evidence of aortic valve stenosis.   8. Pulmonic valve regurgitation is moderate.   9. There is mild dilatation of the aortic root, measuring 42 mm. There is moderate dilatation of the ascending aorta, measuring 43 mm.  10. The inferior vena cava is normal in size with greater than 50% respiratory variability, suggesting right atrial pressure of 3 mmHg.    TEE 03/07/2020: 1. Left ventricular ejection fraction, by estimation, is 35 to 40%. The  left ventricle has moderately decreased function. The left ventricle  demonstrates global hypokinesis.   2. Iatrogenic PFO visualized from prior ablation procedure.   3. Right ventricular systolic function is mildly reduced. The right  ventricular size is mildly enlarged.   4. Left atrial size was severely dilated. No left atrial/left atrial  appendage thrombus was detected.   5. The mitral valve is normal in structure. Mild mitral valve  regurgitation.   6. The aortic valve is tricuspid. There is mild calcification of the  aortic valve. There is mild thickening of the aortic valve. Aortic valve  regurgitation is mild. Mild aortic valve sclerosis is present, with no  evidence of aortic valve stenosis.   7. Aortic dilatation noted. There is mild dilatation of the ascending  aorta, measuring 37 mm. There is mild dilatation of the aortic root,  measuring 41 mm. There is Moderate (Grade III) plaque.   EKG:  EKG is not ordered today.  09/15/2021 tracing personally reviewed shows normal sinus rhythm, 1st degree AV block (250 ms), unchanged incomplete LBBB.  Lipid Panel    Component Value Date/Time   CHOL 147 11/03/2021 1144   TRIG 87.0 11/03/2021 1144   HDL 41.50 11/03/2021 1144   CHOLHDL 4 11/03/2021 1144   VLDL 17.4 11/03/2021 1144    LDLCALC 88 11/03/2021 1144   LDLDIRECT 84.0 05/02/2018 1523     ASSESSMENT:    1. Chronic combined systolic and diastolic heart failure ([place])   2. PAF (paroxysmal atrial fibrillation) ([place])   3. Nonrheumatic aortic valve insufficiency   4. OSA (obstructive sleep apnea)   5. Overweight   6. Essential hypertension   7. Acquired thrombophilia ([place])   8. Stage 3b chronic kidney disease ([place])   9. Controlled type 2 diabetes mellitus with diabetic polyneuropathy, without long-term current use of insulin ([place])   10. Status post placement of implantable loop recorder       PLAN:  In order of problems listed above:  1. CHF (combined systolic and diastolic): Due to nonischemic cardiomyopathy.  He is clinically euvolemic NYHA functional class I.  Enrolled in ALLEVIATE HF trial.  On SGLT2 inhibitor, hydralazine/nitrates, beta-blocker, moderate dose of loop diuretic.  Not on RAAS inhibitors due to renal dysfunction 2. Aflutter/AFib: Has very infrequent and brief breakthrough arrhythmic events since his redo ablation procedure with a remarkably durable benefit.  History of tachycardia cardiomyopathy superimposed on mildly depressed LVEF.  Compliant with anticoagulation.  CHADSVAsc 4 (age, HTN, CHF, history of DM). 3. Moderate AI: Continue to monitor this on a yearly basis.  The murmur is louder than the actual apparent valvular abnormality would suggest.  The aortic insufficiency appeared to be severe enough  to be a cause of the cardiomyopathy and was actually described as mild on the TEE performed last October. 4. OSA: 100% compliant with CPAP.  Denies daytime hypersomnolence. 5.  Overweight: He is a few pounds away from being in optimal weight range.  He should be commended for the excellent job he is done with gradual weight loss. 6. HTN: Very well controlled.  Borderline low.  Does not have symptoms of hypotension.  If these occur, may need to decrease the dose of hydralazine. 7.   Anticoagulation: No falls and no bleeding problems. 9. CKD 3b: Labs at baseline with a creatinine of 1.94 (baseline GFR 35-40) sees [name].  On SGLT2 inhibitor and ACE inhibitor.  No recent gout flares. 10. DM: Excellent control with hemoglobin A1c of only 5.8%.  [place] should be continued for heart failure regardless of diabetes regimen. 11. ILR: Implanted as part of a clinical heart failure trial, but also be very useful for monitoring of arrhythmia rate control.  Has demonstrated a very low burden of atrial arrhythmia following his second ablation, even after antiarrhythmics were discontinued.    Medication Adjustments/Labs and Tests Ordered: Current medicines are reviewed at length with the patient today.  Concerns regarding medicines are outlined above.  Medication changes, Labs and Tests ordered today are listed in the Patient Instructions below. Patient Instructions  Medication Instructions:  No changes *If you need a refill on your cardiac medications before your next appointment, please call your pharmacy*   Lab Work: None ordered If you have labs (blood work) drawn today and your tests are completely normal, you Kevin receive your results only by: [place] (if you have MyChart) OR A paper copy in the mail If you have any lab test that is abnormal or we need to change your treatment, we Kevin call you to review the results.   Testing/Procedures: Your physician has requested that you have an echocardiogram to be done in January 2024. Echocardiography is a painless test that uses sound waves to create images of your heart. It provides your doctor with information about the size and shape of your heart and how well your heart's chambers and valves are working. You may receive an ultrasound enhancing agent through an IV if needed to better visualize your heart during the echo.This procedure takes approximately one hour. There are no restrictions for this procedure. This Kevin  take place at the [address], [address].     Follow-Up: At [facility], you and your health needs are our priority.  As part of our continuing mission to provide you with exceptional heart care, we have created designated Provider Care Teams.  These Care Teams include your primary Cardiologist (physician) and Advanced Practice Providers (APPs -  Physician Assistants and Nurse Practitioners) who all work together to provide you with the care you need, when you need it.  We recommend signing up for the patient portal called "MyChart".  Sign up information is provided on this After Visit Summary.  MyChart is used to connect with patients for Virtual Visits (Telemedicine).  Patients are able to view lab/test results, encounter notes, upcoming appointments, etc.  Non-urgent messages can be sent to your provider as well.   To learn more about what you can do with MyChart, go to [email or web address].    Your next appointment:   12 month(s)  The format for your next appointment:   In Person  Provider:   Sanda Klein, MD {    Important  Information About Sugar         Signed, Kevin Klein, MD  01/01/2022 3:44 PM    [address], [place], [place]  [address] Phone: [phone]; Fax: [phone]

## 2022-01-01 NOTE — Patient Instructions (Addendum)
Medication Instructions:  No changes *If you need a refill on your cardiac medications before your next appointment, please call your pharmacy*   Lab Work: None ordered If you have labs (blood work) drawn today and your tests are completely normal, you will receive your results only by: [place] (if you have MyChart) OR A paper copy in the mail If you have any lab test that is abnormal or we need to change your treatment, we will call you to review the results.   Testing/Procedures: Your physician has requested that you have an echocardiogram to be done in January 2024. Echocardiography is a painless test that uses sound waves to create images of your heart. It provides your doctor with information about the size and shape of your heart and how well your heart's chambers and valves are working. You may receive an ultrasound enhancing agent through an IV if needed to better visualize your heart during the echo.This procedure takes approximately one hour. There are no restrictions for this procedure. This will take place at the [address].     Follow-Up: At [facility], you and your health needs are our priority.  As part of our continuing mission to provide you with exceptional heart care, we have created designated Provider Care Teams.  These Care Teams include your primary Cardiologist (physician) and Advanced Practice Providers (APPs -  Physician Assistants and Nurse Practitioners) who all work together to provide you with the care you need, when you need it.  We recommend signing up for the patient portal called "MyChart".  Sign up information is provided on this After Visit Summary.  MyChart is used to connect with patients for Virtual Visits (Telemedicine).  Patients are able to view lab/test results, encounter notes, upcoming appointments, etc.  Non-urgent messages can be sent to your provider as well.   To learn more about what you can do with MyChart, go to  [email or web address].    Your next appointment:   12 month(s)  The format for your next appointment:   In Person  Provider:   Sanda Klein, MD {    Important Information About Sugar

## 2022-01-08 ENCOUNTER — Other Ambulatory Visit: Payer: Self-pay | Admitting: Family Medicine

## 2022-01-08 NOTE — Telephone Encounter (Signed)
Requesting:restoril 30 mg  Contract:07/07/21 UDS:07/07/21 Last Visit:11/03/21 Next Visit:03/09/22 Last Refill:07/07/21  Please Advise

## 2022-01-10 ENCOUNTER — Other Ambulatory Visit: Payer: Self-pay | Admitting: Family Medicine

## 2022-01-10 DIAGNOSIS — I10 Essential (primary) hypertension: Secondary | ICD-10-CM

## 2022-01-10 DIAGNOSIS — H409 Unspecified glaucoma: Secondary | ICD-10-CM

## 2022-01-10 DIAGNOSIS — M25561 Pain in right knee: Secondary | ICD-10-CM

## 2022-01-10 DIAGNOSIS — Z7901 Long term (current) use of anticoagulants: Secondary | ICD-10-CM

## 2022-01-10 DIAGNOSIS — E785 Hyperlipidemia, unspecified: Secondary | ICD-10-CM

## 2022-01-10 DIAGNOSIS — E875 Hyperkalemia: Secondary | ICD-10-CM

## 2022-01-10 DIAGNOSIS — E669 Obesity, unspecified: Secondary | ICD-10-CM

## 2022-01-10 DIAGNOSIS — G4700 Insomnia, unspecified: Secondary | ICD-10-CM

## 2022-01-10 DIAGNOSIS — D649 Anemia, unspecified: Secondary | ICD-10-CM

## 2022-01-10 DIAGNOSIS — F411 Generalized anxiety disorder: Secondary | ICD-10-CM

## 2022-01-11 ENCOUNTER — Telehealth: Payer: Self-pay

## 2022-01-11 DIAGNOSIS — Z006 Encounter for examination for normal comparison and control in clinical research program: Secondary | ICD-10-CM

## 2022-01-11 NOTE — Research (Cosign Needed)
Alleviate HF Research Study Investigator Action    Medications reviewed and action taken:   -'[]'$  Medication changes made                       -'[x]'$  No medication change     **Patient is asymptomatic and rarely has hi-risk reports**                  -'[]'$  PRN medication intervention changes

## 2022-01-11 NOTE — Telephone Encounter (Signed)
Patient will stop by and pick up a sample of Ozempic. He will also contact patient assistance to find out when next shipment will come

## 2022-01-15 ENCOUNTER — Ambulatory Visit: Payer: Medicare HMO | Admitting: Podiatry

## 2022-01-15 ENCOUNTER — Encounter: Payer: Self-pay | Admitting: Podiatry

## 2022-01-15 DIAGNOSIS — B351 Tinea unguium: Secondary | ICD-10-CM | POA: Diagnosis not present

## 2022-01-15 DIAGNOSIS — M79675 Pain in left toe(s): Secondary | ICD-10-CM | POA: Diagnosis not present

## 2022-01-15 DIAGNOSIS — E1142 Type 2 diabetes mellitus with diabetic polyneuropathy: Secondary | ICD-10-CM

## 2022-01-15 DIAGNOSIS — M79674 Pain in right toe(s): Secondary | ICD-10-CM

## 2022-01-15 NOTE — Telephone Encounter (Signed)
Patient came by and picked up sample.  Wanted to let Dr [name] and Orpha Bur know that he has not been able to confirm when his patient assistance will arrive and has yet to be able to speak to anyone at manufacturer to confirm when shipment is to be sent to him.  Also wanted to know how re-application process will work.

## 2022-01-15 NOTE — Progress Notes (Unsigned)
  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 Pain is relieved with periodic professional debridement.  Patient states blood glucose was 117 mg/dl today.  Last A1c was 5.8%.  Last known HgA1c was unknown.  Patient did not check blood glucose today.  Patient does not monitor blood glucose daily.  Patient is not required to monitor blood glucose daily.  New problem(s): None.   PCP is [name], MD , and last visit was  Oct 12, 2021  Allergies  Allergen Reactions   Sulfonamide Derivatives Other (See Comments)    Flu like symptoms    Zolpidem Other (See Comments)    Excessive, prolonged sedation    Review of Systems: Negative except as noted in the HPI.  Objective: No changes noted in today's physical examination. Constitutional Patient is a pleasant 71 y.o. African American male WD, WN in NAD. AAO x 3.  Vascular CFT immediate b/l LE. Palpable DP/PT pulses b/l LE. Digital hair sparse b/l. Skin temperature gradient WNL b/l. No pain with calf compression b/l. No edema noted b/l. No cyanosis or clubbing noted b/l LE.  Neurologic Pt has subjective symptoms of neuropathy. Protective sensation intact 5/5 intact bilaterally with 10g monofilament b/l. Vibratory sensation intact b/l.  Dermatologic Pedal integument with normal turgor, texture and tone b/l LE. No open wounds b/l. No interdigital macerations b/l. Toenails 1-5 b/l elongated, thickened, discolored with subungual debris. +Tenderness with dorsal palpation of nailplates. No hyperkeratotic nor porokeratotic lesions present on today's visit.  Orthopedic: Muscle strength 5/5 to all lower extremity muscle groups bilaterally. No pain, crepitus or joint limitation noted with ROM bilateral LE. Hammertoe deformity noted  2-5 b/l.      Latest Ref Rng & Units 10/29/2021   10:46 AM 06/30/2021   11:03 AM 03/06/2021    9:42 AM 02/24/2021    2:48 PM  Hemoglobin A1C  Hemoglobin-A1c 4.0 - 5.6 % 5.8  5.9  8.2  7.4    Assessment/Plan: No diagnosis found.   -Patient was evaluated and treated. All patient's and/or POA's questions/concerns answered on today's visit. -We discussed Epic's Limb at Risk flag and I feel we can defer for now. He has no claudication symptoms, no rest pain and has palpable pedal pulses. His symptoms seem to be more in line with neuropathy. -Patient to continue soft, supportive shoe gear daily. -Mycotic toenails 1-5 bilaterally were debrided in length and girth with sterile nail nippers and dremel without incident. -Patient/POA to call should there be question/concern in the interim.   Return in about 3 months (around 04/17/2022).  Marzetta Board, DPM

## 2022-01-18 ENCOUNTER — Other Ambulatory Visit: Payer: Self-pay | Admitting: Cardiology

## 2022-01-19 NOTE — Telephone Encounter (Signed)
Pt was advised he needed to resubmit patient assistance application. Pt was advised he can come by the office and complete application left at front desk.

## 2022-01-20 NOTE — Telephone Encounter (Signed)
Patient picked up patient assistance paperwork

## 2022-01-21 DIAGNOSIS — M109 Gout, unspecified: Secondary | ICD-10-CM | POA: Diagnosis not present

## 2022-01-21 DIAGNOSIS — N1832 Chronic kidney disease, stage 3b: Secondary | ICD-10-CM | POA: Diagnosis not present

## 2022-01-21 DIAGNOSIS — I129 Hypertensive chronic kidney disease with stage 1 through stage 4 chronic kidney disease, or unspecified chronic kidney disease: Secondary | ICD-10-CM | POA: Diagnosis not present

## 2022-01-21 DIAGNOSIS — E1122 Type 2 diabetes mellitus with diabetic chronic kidney disease: Secondary | ICD-10-CM | POA: Diagnosis not present

## 2022-01-21 DIAGNOSIS — N2581 Secondary hyperparathyroidism of renal origin: Secondary | ICD-10-CM | POA: Diagnosis not present

## 2022-01-22 NOTE — Telephone Encounter (Signed)
Patient dropped off patient assistant paperwork. Put into doctor's mailbox at front desk

## 2022-01-25 DIAGNOSIS — I129 Hypertensive chronic kidney disease with stage 1 through stage 4 chronic kidney disease, or unspecified chronic kidney disease: Secondary | ICD-10-CM | POA: Diagnosis not present

## 2022-01-25 DIAGNOSIS — N1832 Chronic kidney disease, stage 3b: Secondary | ICD-10-CM | POA: Diagnosis not present

## 2022-01-25 DIAGNOSIS — I5042 Chronic combined systolic (congestive) and diastolic (congestive) heart failure: Secondary | ICD-10-CM | POA: Diagnosis not present

## 2022-01-28 DIAGNOSIS — Z006 Encounter for examination for normal comparison and control in clinical research program: Secondary | ICD-10-CM

## 2022-01-31 ENCOUNTER — Other Ambulatory Visit: Payer: Self-pay | Admitting: Internal Medicine

## 2022-01-31 DIAGNOSIS — E1169 Type 2 diabetes mellitus with other specified complication: Secondary | ICD-10-CM

## 2022-02-03 ENCOUNTER — Other Ambulatory Visit: Payer: Self-pay

## 2022-02-03 ENCOUNTER — Other Ambulatory Visit: Payer: Self-pay | Admitting: Family Medicine

## 2022-02-03 MED ORDER — TEMAZEPAM 30 MG PO CAPS: 30.0000 mg | ORAL_CAPSULE | Freq: Every day | ORAL | 0 refills | Status: DC

## 2022-02-03 NOTE — Telephone Encounter (Signed)
Requesting:restoril 30 mg Contract:07/07/21 UDS:07/07/21 Last Visit:11/03/21 Next Visit:03/09/22 Last Refill:  Please Advise

## 2022-02-06 ENCOUNTER — Other Ambulatory Visit: Payer: Self-pay | Admitting: Family Medicine

## 2022-02-09 ENCOUNTER — Other Ambulatory Visit: Payer: Self-pay | Admitting: Family Medicine

## 2022-02-09 ENCOUNTER — Other Ambulatory Visit: Payer: Self-pay

## 2022-02-09 ENCOUNTER — Telehealth: Payer: Self-pay | Admitting: Family Medicine

## 2022-02-09 MED ORDER — TEMAZEPAM 30 MG PO CAPS: 30.0000 mg | ORAL_CAPSULE | Freq: Every day | ORAL | 0 refills | Status: DC

## 2022-02-09 NOTE — Telephone Encounter (Signed)
Looks like refill was printed instead of sent to pharmacy. Pt also stated he hopes all is well with pcp's family, and sends his love.   Medication: temazepam (RESTORIL) 30 MG capsule   Has the patient contacted their pharmacy? Yes.     Preferred Pharmacy:  [facility] [address] [place]   [address] [place] [address]  Phone:  [phone]  Fax:  [phone]

## 2022-02-12 NOTE — Telephone Encounter (Signed)
Lvm for pt and advised he can stop by the office to complete a new application.

## 2022-02-12 NOTE — Telephone Encounter (Signed)
Patient assistance program contacted and advised size of household and pt signature missing/illegible from application.

## 2022-02-16 NOTE — Research (Signed)
Alleviate HF  Medications reviewed and action taken:   -'[]'$  Medication changes made                       -'[x]'$  No medication change     **Patient is asymptomatic and rarely has hi-risk reports**                  -'[]'$  PRN medication intervention changes

## 2022-02-16 NOTE — Telephone Encounter (Signed)
Patient dropped off completed patient assistance paperwork. Was placed in MD box at front desk

## 2022-03-04 NOTE — Progress Notes (Signed)
Subjective:   By signing my name below, I, [name], attest that this documentation has been prepared under the direction and in the presence of [name], MD 03/09/2022.     Patient ID: [name], male    DOB: 09/18/1950, 71 y.o.   MRN: [id number]  Chief Complaint  Patient presents with   Follow-up    Follow up    HPI Patient is in today for an office visit.  Diet: He states that he does not eat processed foods.  Endocrinology: He saw his endocrinologist, Dr. [name], on 10/29/2021 to manage his type 2 diabetes mellitus.  Lab Results  Component Value Date   HGBA1C 5.8 (A) 10/29/2021   Gout: He is currently taking Allopurinol 300 mg to manage his gout and uric acid levels.  Lab Results  Component Value Date   LABURIC 4.5 11/03/2021   Immunizations: He has been informed about receiving COVID-19, high-dose Flu, RSV, and Tetanus immunizations.   Nephrology: He saw his nephrologist, Dr. [name], on 01/25/2022 to manage his stage 3b chronic kidney disease.   Refills: He is requesting a refill on Temazepam 30 mg.   Past Medical History:  Diagnosis Date   AORTIC STENOSIS, MODERATE 12/05/2009   Arthritis    Atrial fibrillation (HCC)    Atrial fibrillation with RVR ([place]) 07/05/2017   Atrial flutter ([place]) 12/05/2009   Atypical chest pain 11/08/2011   BENIGN PROSTATIC HYPERTROPHY, HX OF 12/05/2009   CHEST PAIN-UNSPECIFIED 11/11/2009   CHF 12/05/2009   Chronic renal insufficiency 12/16/2016   Debility 04/18/2012   Diarrhea 12/16/2016   DYSPNEA 12/19/2009   Edema 04/18/2012   ERECTILE DYSFUNCTION 01/25/2007   FATIGUE, CHRONIC 11/11/2009   Gout 07/10/2012   Heart murmur    History of kidney stones    "they passed" (07/05/2017)   Hyperkalemia 04/18/2012   Hyperlipidemia    Hypertension    HYPERTENSION 01/25/2007   Hypoxia 05/05/2011   Insomnia 05/13/2016   INSOMNIA, HX OF 01/25/2007   Knee pain, bilateral 12/28/2010   Knee pain, right 12/28/2010    NEUROMA 01/05/2010   OBESITY NOS 01/25/2007   OSA on CPAP 04/06/2010   PERIPHERAL NEUROPATHY 01/05/2010   PULMONARY FUNCTION TESTS, ABNORMAL 02/02/2010   Sleep apnea    wears CPAP   Superficial thrombophlebitis of left leg 09/07/2011   TESTICULAR HYPOFUNCTION 01/05/2010   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 [place] [facility];  Service: Cardiovascular;  Laterality: N/A;   ATRIAL FIBRILLATION ABLATION N/A 03/10/2020   Procedure: ATRIAL FIBRILLATION ABLATION;  Surgeon: Constance Haw, MD;  Location: [place] [facility];  Service: Cardiovascular;  Laterality: N/A;   CARDIAC CATHETERIZATION  10/16/2009   nonischemic cardiomyopathy   CARDIOVERSION N/A 03/25/2015   Procedure: CARDIOVERSION;  Surgeon: Pixie Casino, MD;  Location: [facility];  Service: Cardiovascular;  Laterality: N/A;   CARDIOVERSION N/A 06/17/2017   Procedure: CARDIOVERSION;  Surgeon: Sanda Klein, MD;  Location: [facility];  Service: Cardiovascular;  Laterality: N/A;   CARDIOVERSION N/A 07/08/2017   Procedure: CARDIOVERSION;  Surgeon: Dorris Carnes  V, MD;  Location: [facility];  Service: Cardiovascular;  Laterality: N/A;   CARDIOVERSION N/A 02/27/2019   Procedure: CARDIOVERSION;  Surgeon: Sanda Klein, MD;  Location: [facility];  Service: Cardiovascular;  Laterality: N/A;   CARDIOVERSION N/A 08/16/2019   Procedure: CARDIOVERSION;  Surgeon: Sanda Klein, MD;  Location: [facility];  Service: Cardiovascular;  Laterality: N/A;   COLONOSCOPY   03/10/2021   1st colonoscopy , Wilfrid Lund, [facility], large transverse polyp, clip and spot applied   METATARSAL OSTEOTOMY Bilateral ~ 1980   removed part of 5th metatarsal to corect curvature of toes    RIGHT HEART CATH N/A 05/16/2019   Procedure: RIGHT HEART CATH;  Surgeon: Jolaine Artist, MD;  Location: [place];  Service: Cardiovascular;  Laterality: N/A;   TEE WITHOUT CARDIOVERSION  05/16/2019   Procedure: Transesophageal Echocardiogram ([place]);  Surgeon: Constance Haw, MD;  Location: [place] [facility];  Service: Cardiovascular;;   TEE WITHOUT CARDIOVERSION N/A 03/07/2020   Procedure: TRANSESOPHAGEAL ECHOCARDIOGRAM (TEE);  Surgeon: Freada Bergeron, MD;  Location: [facility] ENDOSCOPY;  Service: Cardiovascular;  Laterality: N/A;   Family History  Problem Relation Age of Onset   Clotting disorder Mother    Heart disease Mother        s/p MI   Heart attack Mother    Hypertension Mother    Diabetes Mother    Hyperlipidemia Mother    Stroke Mother    Cancer Father        ? lung   Lung disease Father        smoker   Diabetes Sister    Hypertension Sister        smoker   Aneurysm Sister        brain   Other Sister        clipped   Seizures Sister        d/o w/aneurysm/ smoker   Leukemia Maternal Grandmother        ?   Colon cancer Neg Hx    Colon polyps Neg Hx    Stomach cancer Neg Hx    Rectal cancer Neg Hx    Social History   Socioeconomic History   Marital status: Married    Spouse name: Not on file   Number of children: 4   Years of education: 12   Highest education level: High school graduate  Occupational History   Occupation: retired    Fish farm manager: [employer]  Tobacco Use   Smoking status: Former    Packs/day: 1.00    Years: 10.00    Total pack years: 10.00    Types: Cigarettes    Quit date: 06/07/1980    Years since quitting: 41.7   Smokeless tobacco: Never  Vaping Use   Vaping Use: Never used  Substance and Sexual Activity   Alcohol use:  No   Drug use: No   Sexual activity: Not Currently  Other Topics Concern   Not on file  Social History Narrative   Lives with male partner in a one story home.  His son lives there off and on.  Retired from [employer].  Education: high school.  Right handed   Social Determinants of Health   Financial Resource Strain: Not on file  Food Insecurity: Not on file  Transportation Needs: Not on file  Physical Activity: Not on file  Stress: Not on file  Social Connections: Not on file  Intimate Partner Violence: Not on file   Outpatient Medications Prior to  Visit  Medication Sig Dispense Refill   Accu-Chek Softclix Lancets lancets Use as instructed 200 each 3   albuterol (VENTOLIN HFA) 108 (90 Base) MCG/ACT inhaler Inhale 2 puffs into the lungs every 6 (six) hours as needed for wheezing or shortness of breath. 1 Inhaler 6   Alcohol Swabs (DROPSAFE ALCOHOL PREP) 70 % PADS USE AS DIRECTED 300 each 1   allopurinol (ZYLOPRIM) 300 MG tablet Take 1 tablet by mouth once daily 90 tablet 1   apixaban (ELIQUIS) 5 MG TABS tablet Take 1 tablet by mouth twice daily 60 tablet 5   Blood Glucose Monitoring Suppl (ACCU-CHEK GUIDE ME) w/Device KIT USE TO TEST BLOOD SUGAR ONCE TO TWICE DAILY 1 kit 0   calcium carbonate (TUMS - DOSED IN MG ELEMENTAL CALCIUM) 500 MG chewable tablet Chew 500 mg by mouth daily as needed for indigestion or heartburn.     cetirizine (ZYRTEC) 10 MG tablet Take 1 tablet (10 mg total) by mouth daily. 30 tablet 11   clobetasol ointment (TEMOVATE) 6.72 % 1 application to affected area     Clobetasol Prop Emollient Base (CLOBETASOL PROPIONATE E) 0.05 % emollient cream Apply 1 application topically 2 (two) times daily as needed (insect bite). 60 g 1   colchicine (COLCRYS) 0.6 MG tablet Take one tablet for 4-6 days as needed for gout flare up. 15 tablet 3   dapagliflozin propanediol (FARXIGA) 10 MG TABS tablet TAKE 1 TABLET BY MOUTH ONCE DAILY 90 tablet 3   fish oil-omega-3 fatty acids 1000  MG capsule Take 1 g by mouth daily.     FLUZONE HIGH-DOSE QUADRIVALENT 0.7 ML SUSY      furosemide (LASIX) 80 MG tablet Take 1 tablet (80 mg total) by mouth daily. As needed patient may take an additional 40 MG tablet as direct per Alleviate Research study PRN plan. 90 tablet 3   gabapentin (NEURONTIN) 100 MG capsule Take 2 capsules (200 mg total) by mouth at bedtime. 180 capsule 3   glucose blood (ACCU-CHEK GUIDE) test strip USE AS INSTRUCTED TO CHECK BLOOD SUGAR ONCE TO TWICE DAILY 100 each 0   hydrALAZINE (APRESOLINE) 25 MG tablet TAKE 1 TABLET BY MOUTH THREE TIMES DAILY 270 tablet 3   HYDROcodone-acetaminophen (NORCO) 10-325 MG tablet Take 1 tablet by mouth every 8 (eight) hours as needed for moderate pain or severe pain. 90 tablet 0   hydrocortisone 2.5 % ointment Apply topically 2 (two) times daily as needed. 30 g 1   isosorbide mononitrate (IMDUR) 30 MG 24 hr tablet Take 1 tablet by mouth once daily 90 tablet 0   LORazepam (ATIVAN) 1 MG tablet Take 1 tablet (1 mg total) by mouth every 8 (eight) hours as needed. for anxiety 30 tablet 5   magnesium oxide (MAG-OX) 400 MG tablet Take 1 tablet (400 mg total) by mouth 2 (two) times daily. 30 tablet 11   metoprolol succinate (TOPROL-XL) 50 MG 24 hr tablet Take 1 tablet (50 mg total) by mouth in the morning and at bedtime. WITH OR FOLLOWING A MEAL 180 tablet 3   Multiple Vitamin (MULTIVITAMIN WITH MINERALS) TABS tablet Take 1 tablet by mouth daily.      Polyethyl Glycol-Propyl Glycol (SYSTANE OP) Place 1 drop into both eyes daily as needed (dry eyes).     pravastatin (PRAVACHOL) 40 MG tablet Take 1 tablet (40 mg total) by mouth daily. 90 tablet 3   Semaglutide,0.25 or 0.5MG /DOS, (OZEMPIC, 0.25 OR 0.5 MG/DOSE,) 2 MG/1.5ML SOPN Inject 0.5 mg into the  skin once a week. 4.5 mL 3   tamsulosin (FLOMAX) 0.4 MG CAPS capsule Take 1 capsule by mouth once daily 30 capsule 0   tiZANidine (ZANAFLEX) 2 MG tablet Take 0.5-1 tablets (1-2 mg total) by mouth at  bedtime as needed for muscle spasms. 3020 tablet 1   VITAMIN D PO Take by mouth.     white petrolatum (VASELINE) GEL See admin instructions.     temazepam (RESTORIL) 30 MG capsule Take 1 capsule (30 mg total) by mouth at bedtime. 30 capsule 0   potassium chloride SA (KLOR-CON M20) 20 MEQ tablet Take 1 tablet (20 mEq total) by mouth as directed. 1 tablet daily 20 mEq by mouth as directed per alleviate research protocol. 90 tablet 3   No facility-administered medications prior to visit.   Allergies  Allergen Reactions   Sulfonamide Derivatives Other (See Comments)    Flu like symptoms    Zolpidem Other (See Comments)    Excessive, prolonged sedation   ROS    Objective:    Physical Exam Constitutional:      General: He is not in acute distress.    Appearance: Normal appearance. He is not ill-appearing.  HENT:     Head: Normocephalic and atraumatic.     Right Ear: External ear normal.     Left Ear: External ear normal.     Mouth/Throat:     Mouth: Mucous membranes are moist.     Pharynx: Oropharynx is clear.  Eyes:     Extraocular Movements: Extraocular movements intact.     Pupils: Pupils are equal, round, and reactive to light.  Cardiovascular:     Rate and Rhythm: Normal rate and regular rhythm.     Pulses: Normal pulses.     Heart sounds: Normal heart sounds. No murmur heard.    No gallop.  Pulmonary:     Effort: Pulmonary effort is normal. No respiratory distress.     Breath sounds: Normal breath sounds. No wheezing or rales.  Abdominal:     General: Bowel sounds are normal.  Skin:    General: Skin is warm and dry.  Neurological:     Mental Status: He is alert and oriented to person, place, and time.  Psychiatric:        Mood and Affect: Mood normal.        Behavior: Behavior normal.        Judgment: Judgment normal.    BP 110/64 (BP Location: Right Arm, Patient Position: Sitting, Cuff Size: Normal)   Pulse 79   Temp 98.2 F (36.8 C) (Oral)   Resp 16   Ht 5'  11" (1.803 m)   Wt 187 lb 9.6 oz (85.1 kg)   SpO2 95%   BMI 26.16 kg/m  Wt Readings from Last 3 Encounters:  03/09/22 187 lb 9.6 oz (85.1 kg)  01/01/22 184 lb 6.4 oz (83.6 kg)  11/03/21 184 lb 6.4 oz (83.6 kg)   Diabetic Foot Exam - Simple   No data filed    Lab Results  Component Value Date   WBC 4.5 11/03/2021   HGB 13.4 11/03/2021   HCT 41.5 11/03/2021   PLT 178.0 11/03/2021   GLUCOSE 112 (H) 11/03/2021   CHOL 147 11/03/2021   TRIG 87.0 11/03/2021   HDL 41.50 11/03/2021   LDLDIRECT 84.0 05/02/2018   LDLCALC 88 11/03/2021   ALT 12 11/03/2021   AST 17 11/03/2021   NA 139 11/03/2021   K 4.4 11/03/2021   CL 99  11/03/2021   CREATININE 1.94 (H) 11/03/2021   BUN 38 (H) 11/03/2021   CO2 32 11/03/2021   TSH 1.81 11/03/2021   PSA 10.76 (H) 02/06/2019   INR 2.2 09/06/2018   HGBA1C 5.8 (A) 10/29/2021   MICROALBUR <0.7 11/03/2021   Lab Results  Component Value Date   TSH 1.81 11/03/2021   Lab Results  Component Value Date   WBC 4.5 11/03/2021   HGB 13.4 11/03/2021   HCT 41.5 11/03/2021   MCV 82.4 11/03/2021   PLT 178.0 11/03/2021   Lab Results  Component Value Date   NA 139 11/03/2021   K 4.4 11/03/2021   CO2 32 11/03/2021   GLUCOSE 112 (H) 11/03/2021   BUN 38 (H) 11/03/2021   CREATININE 1.94 (H) 11/03/2021   BILITOT 0.7 11/03/2021   ALKPHOS 48 11/03/2021   AST 17 11/03/2021   ALT 12 11/03/2021   PROT 7.3 11/03/2021   ALBUMIN 4.4 11/03/2021   CALCIUM 10.3 11/03/2021   ANIONGAP 10 11/26/2019   EGFR 47 (L) 05/13/2021   GFR 34.84 (L) 11/03/2021   Lab Results  Component Value Date   CHOL 147 11/03/2021   Lab Results  Component Value Date   HDL 41.50 11/03/2021   Lab Results  Component Value Date   LDLCALC 88 11/03/2021   Lab Results  Component Value Date   TRIG 87.0 11/03/2021   Lab Results  Component Value Date   CHOLHDL 4 11/03/2021   Lab Results  Component Value Date   HGBA1C 5.8 (A) 10/29/2021     Assessment & Plan:   Problem  List Items Addressed This Visit     Diabetes mellitus type 2 in obese (HCC)    hgba1c acceptable, minimize simple carbs. Increase exercise as tolerated. Continue current meds RSV (respiratory syncitial virus) vaccine at pharmacy, Arexvy Covid booster new version At pharmacy High dose flu shot given today Shingrix is the new shingles shot, 2 shots over 2-6 months, confirm coverage with insurance and document, then can return here for shots with nurse appt or at pharmacy, consider taking      Chronic combined systolic and diastolic heart failure (HCC)    Follows with CHF clinic and has been doing well      Hyperlipidemia - Primary    Encourage heart healthy diet such as MIND or DASH diet, increase exercise, avoid trans fats, simple carbohydrates and processed foods, consider a krill or fish or flaxseed oil cap daily. Pravastatin tolerating       Relevant Orders   Lipid panel   Gout    Hydrate and monitor      Relevant Orders   Uric acid   Insomnia    Encouraged good sleep hygiene such as dark, quiet room. No blue/green glowing lights such as computer screens in bedroom. No alcohol or stimulants in evening. Cut down on caffeine as able. Regular exercise is helpful but not just prior to bed time. Stable on Temazepam      Atrial fibrillation (HCC)    Rate controlled, tolerating meds      Benign hypertensive kidney disease with chronic kidney disease    Following with [facility], stable      Relevant Orders   CBC   Comprehensive metabolic panel   TSH   Meds ordered this encounter  Medications   temazepam (RESTORIL) 30 MG capsule    Sig: Take 1 capsule (30 mg total) by mouth at bedtime.    Dispense:  30 capsule  Refill:  0   I, Penni Homans, MD, personally preformed the services described in this documentation.  All medical record entries made by the scribe were at my direction and in my presence.  I have reviewed the chart and discharge instructions (if  applicable) and agree that the record reflects my personal performance and is accurate and complete. 03/09/2022  I,Mohammed Iqbal,acting as a scribe for Penni Homans, MD.,have documented all relevant documentation on the behalf of Penni Homans, MD,as directed by  Penni Homans, MD while in the presence of Penni Homans, MD.  Penni Homans, MD

## 2022-03-08 NOTE — Assessment & Plan Note (Signed)
Follows with CHF clinic and has been doing well

## 2022-03-08 NOTE — Assessment & Plan Note (Signed)
Hydrate and monitor 

## 2022-03-08 NOTE — Assessment & Plan Note (Addendum)
hgba1c acceptable, minimize simple carbs. Increase exercise as tolerated. Continue current meds RSV (respiratory syncitial virus) vaccine at pharmacy, Arexvy Covid booster new version At pharmacy High dose flu shot given today Shingrix is the new shingles shot, 2 shots over 2-6 months, confirm coverage with insurance and document, then can return here for shots with nurse appt or at pharmacy, consider taking

## 2022-03-08 NOTE — Assessment & Plan Note (Signed)
Following with [facility], stable

## 2022-03-08 NOTE — Assessment & Plan Note (Signed)
Rate controlled, tolerating meds 

## 2022-03-08 NOTE — Assessment & Plan Note (Signed)
Encouraged good sleep hygiene such as dark, quiet room. No blue/green glowing lights such as computer screens in bedroom. No alcohol or stimulants in evening. Cut down on caffeine as able. Regular exercise is helpful but not just prior to bed time. Stable on Temazepam

## 2022-03-09 ENCOUNTER — Ambulatory Visit (INDEPENDENT_AMBULATORY_CARE_PROVIDER_SITE_OTHER): Payer: Medicare HMO | Admitting: Family Medicine

## 2022-03-09 VITALS — BP 110/64 | HR 79 | Temp 98.2°F | Resp 16 | Ht 71.0 in | Wt 187.6 lb

## 2022-03-09 DIAGNOSIS — G4700 Insomnia, unspecified: Secondary | ICD-10-CM | POA: Diagnosis not present

## 2022-03-09 DIAGNOSIS — E1169 Type 2 diabetes mellitus with other specified complication: Secondary | ICD-10-CM

## 2022-03-09 DIAGNOSIS — I129 Hypertensive chronic kidney disease with stage 1 through stage 4 chronic kidney disease, or unspecified chronic kidney disease: Secondary | ICD-10-CM

## 2022-03-09 DIAGNOSIS — I4891 Unspecified atrial fibrillation: Secondary | ICD-10-CM

## 2022-03-09 DIAGNOSIS — E669 Obesity, unspecified: Secondary | ICD-10-CM | POA: Diagnosis not present

## 2022-03-09 DIAGNOSIS — M1A9XX0 Chronic gout, unspecified, without tophus (tophi): Secondary | ICD-10-CM

## 2022-03-09 DIAGNOSIS — I5042 Chronic combined systolic (congestive) and diastolic (congestive) heart failure: Secondary | ICD-10-CM | POA: Diagnosis not present

## 2022-03-09 DIAGNOSIS — E782 Mixed hyperlipidemia: Secondary | ICD-10-CM

## 2022-03-09 LAB — LIPID PANEL
Cholesterol: 138 mg/dL (ref 0–200)
HDL: 39.8 mg/dL (ref >39.00)
LDL Cholesterol: 81 mg/dL (ref 0–99)
NonHDL: 98.41
Total CHOL/HDL Ratio: 3
Triglycerides: 86 mg/dL (ref 0.0–149.0)
VLDL: 17.2 mg/dL (ref 0.0–40.0)

## 2022-03-09 LAB — COMPREHENSIVE METABOLIC PANEL
ALT: 14 U/L (ref 0–53)
AST: 19 U/L (ref 0–37)
Albumin: 4.5 g/dL (ref 3.5–5.2)
Alkaline Phosphatase: 51 U/L (ref 39–117)
BUN: 26 mg/dL — ABNORMAL HIGH (ref 6–23)
CO2: 31 mEq/L (ref 19–32)
Calcium: 10 mg/dL (ref 8.4–10.5)
Chloride: 102 mEq/L (ref 96–112)
Creatinine, Ser: 1.61 mg/dL — ABNORMAL HIGH (ref 0.40–1.50)
GFR: 43.47 mL/min — ABNORMAL LOW (ref >60.00)
Glucose, Bld: 114 mg/dL — ABNORMAL HIGH (ref 70–99)
Potassium: 4.2 mEq/L (ref 3.5–5.1)
Sodium: 142 mEq/L (ref 135–145)
Total Bilirubin: 0.5 mg/dL (ref 0.2–1.2)
Total Protein: 7.5 g/dL (ref 6.0–8.3)

## 2022-03-09 LAB — CBC
HCT: 44.3 % (ref 39.0–52.0)
Hemoglobin: 14.1 g/dL (ref 13.0–17.0)
MCHC: 31.8 g/dL (ref 30.0–36.0)
MCV: 83.4 fl (ref 78.0–100.0)
Platelets: 162 10*3/uL (ref 150.0–400.0)
RBC: 5.31 Mil/uL (ref 4.22–5.81)
RDW: 16.8 % — ABNORMAL HIGH (ref 11.5–15.5)
WBC: 3.8 10*3/uL — ABNORMAL LOW (ref 4.0–10.5)

## 2022-03-09 LAB — TSH: TSH: 1.91 u[IU]/mL (ref 0.35–5.50)

## 2022-03-09 LAB — URIC ACID: Uric Acid, Serum: 4.3 mg/dL (ref 4.0–7.8)

## 2022-03-09 MED ORDER — TEMAZEPAM 30 MG PO CAPS: 30.0000 mg | ORAL_CAPSULE | Freq: Every day | ORAL | 0 refills | Status: DC

## 2022-03-09 NOTE — Patient Instructions (Addendum)
Covid booster in 2 weeks then RSV (Respiratory Syncytial Virus) Arexvy shot 2 weeks after that  Tetanus some time in next 6 months  Hypertension, Adult High blood pressure (hypertension) is when the force of blood pumping through the arteries is too strong. The arteries are the blood vessels that carry blood from the heart throughout the body. Hypertension forces the heart to work harder to pump blood and may cause arteries to become narrow or stiff. Untreated or uncontrolled hypertension can lead to a heart attack, heart failure, a stroke, kidney disease, and other problems. A blood pressure reading consists of a higher number over a lower number. Ideally, your blood pressure should be below 120/80. The first ("top") number is called the systolic pressure. It is a measure of the pressure in your arteries as your heart beats. The second ("bottom") number is called the diastolic pressure. It is a measure of the pressure in your arteries as the heart relaxes. What are the causes? The exact cause of this condition is not known. There are some conditions that result in high blood pressure. What increases the risk? Certain factors may make you more likely to develop high blood pressure. Some of these risk factors are under your control, including: Smoking. Not getting enough exercise or physical activity. Being overweight. Having too much fat, sugar, calories, or salt (sodium) in your diet. Drinking too much alcohol. Other risk factors include: Having a personal history of heart disease, diabetes, high cholesterol, or kidney disease. Stress. Having a family history of high blood pressure and high cholesterol. Having obstructive sleep apnea. Age. The risk increases with age. What are the signs or symptoms? High blood pressure may not cause symptoms. Very high blood pressure (hypertensive crisis) may cause: Headache. Fast or irregular heartbeats (palpitations). Shortness of  breath. Nosebleed. Nausea and vomiting. Vision changes. Severe chest pain, dizziness, and seizures. How is this diagnosed? This condition is diagnosed by measuring your blood pressure while you are seated, with your arm resting on a flat surface, your legs uncrossed, and your feet flat on the floor. The cuff of the blood pressure monitor will be placed directly against the skin of your upper arm at the level of your heart. Blood pressure should be measured at least twice using the same arm. Certain conditions can cause a difference in blood pressure between your right and left arms. If you have a high blood pressure reading during one visit or you have normal blood pressure with other risk factors, you may be asked to: Return on a different day to have your blood pressure checked again. Monitor your blood pressure at home for 1 week or longer. If you are diagnosed with hypertension, you may have other blood or imaging tests to help your health care provider understand your overall risk for other conditions. How is this treated? This condition is treated by making healthy lifestyle changes, such as eating healthy foods, exercising more, and reducing your alcohol intake. You may be referred for counseling on a healthy diet and physical activity. Your health care provider may prescribe medicine if lifestyle changes are not enough to get your blood pressure under control and if: Your systolic blood pressure is above 130. Your diastolic blood pressure is above 80. Your personal target blood pressure may vary depending on your medical conditions, your age, and other factors. Follow these instructions at home: Eating and drinking  Eat a diet that is high in fiber and potassium, and low in sodium, added sugar, and  fat. An example of this eating plan is called the DASH diet. DASH stands for Dietary Approaches to Stop Hypertension. To eat this way: Eat plenty of fresh fruits and vegetables. Try to fill  one half of your plate at each meal with fruits and vegetables. Eat whole grains, such as whole-wheat pasta, brown rice, or whole-grain bread. Fill about one fourth of your plate with whole grains. Eat or drink low-fat dairy products, such as skim milk or low-fat yogurt. Avoid fatty cuts of meat, processed or cured meats, and poultry with skin. Fill about one fourth of your plate with lean proteins, such as fish, chicken without skin, beans, eggs, or tofu. Avoid pre-made and processed foods. These tend to be higher in sodium, added sugar, and fat. Reduce your daily sodium intake. Many people with hypertension should eat less than 1,500 mg of sodium a day. Do not drink alcohol if: Your health care provider tells you not to drink. You are pregnant, may be pregnant, or are planning to become pregnant. If you drink alcohol: Limit how much you have to: 0-1 drink a day for women. 0-2 drinks a day for men. Know how much alcohol is in your drink. In the [place] one drink equals one 12 oz bottle of beer (355 mL), one 5 oz glass of wine (148 mL), or one 1 oz glass of hard liquor (44 mL). Lifestyle  Work with your health care provider to maintain a healthy body weight or to lose weight. Ask what an ideal weight is for you. Get at least 30 minutes of exercise that causes your heart to beat faster (aerobic exercise) most days of the week. Activities may include walking, swimming, or biking. Include exercise to strengthen your muscles (resistance exercise), such as Pilates or lifting weights, as part of your weekly exercise routine. Try to do these types of exercises for 30 minutes at least 3 days a week. Do not use any products that contain nicotine or tobacco. These products include cigarettes, chewing tobacco, and vaping devices, such as e-cigarettes. If you need help quitting, ask your health care provider. Monitor your blood pressure at home as told by your health care provider. Keep all follow-up visits.  This is important. Medicines Take over-the-counter and prescription medicines only as told by your health care provider. Follow directions carefully. Blood pressure medicines must be taken as prescribed. Do not skip doses of blood pressure medicine. Doing this puts you at risk for problems and can make the medicine less effective. Ask your health care provider about side effects or reactions to medicines that you should watch for. Contact a health care provider if you: Think you are having a reaction to a medicine you are taking. Have headaches that keep coming back (recurring). Feel dizzy. Have swelling in your ankles. Have trouble with your vision. Get help right away if you: Develop a severe headache or confusion. Have unusual weakness or numbness. Feel faint. Have severe pain in your chest or abdomen. Vomit repeatedly. Have trouble breathing. These symptoms may be an emergency. Get help right away. Call 911. Do not wait to see if the symptoms will go away. Do not drive yourself to the hospital. Summary Hypertension is when the force of blood pumping through your arteries is too strong. If this condition is not controlled, it may put you at risk for serious complications. Your personal target blood pressure may vary depending on your medical conditions, your age, and other factors. For most people, a normal blood  pressure is less than 120/80. Hypertension is treated with lifestyle changes, medicines, or a combination of both. Lifestyle changes include losing weight, eating a healthy, low-sodium diet, exercising more, and limiting alcohol. This information is not intended to replace advice given to you by your health care provider. Make sure you discuss any questions you have with your health care provider. Document Revised: 03/31/2021 Document Reviewed: 03/31/2021 Elsevier Patient Education  [place].

## 2022-03-09 NOTE — Assessment & Plan Note (Addendum)
Encourage heart healthy diet such as MIND or DASH diet, increase exercise, avoid trans fats, simple carbohydrates and processed foods, consider a krill or fish or flaxseed oil cap daily. Pravastatin tolerating

## 2022-03-14 ENCOUNTER — Other Ambulatory Visit: Payer: Self-pay | Admitting: Internal Medicine

## 2022-03-14 ENCOUNTER — Other Ambulatory Visit: Payer: Self-pay | Admitting: Cardiology

## 2022-03-14 DIAGNOSIS — E669 Obesity, unspecified: Secondary | ICD-10-CM

## 2022-03-15 ENCOUNTER — Other Ambulatory Visit: Payer: Self-pay | Admitting: Internal Medicine

## 2022-03-17 ENCOUNTER — Encounter: Payer: Self-pay | Admitting: Gastroenterology

## 2022-03-18 ENCOUNTER — Telehealth: Payer: Self-pay

## 2022-03-18 NOTE — Telephone Encounter (Signed)
Called and advised pt Ozempic (5 boxes) has been delivered to the office and is ready for pick up.

## 2022-03-18 NOTE — Telephone Encounter (Signed)
Patient picked up patient assistance - log noted  

## 2022-03-19 ENCOUNTER — Telehealth: Payer: Self-pay

## 2022-03-19 NOTE — Telephone Encounter (Signed)
[name], Please obtain an Eliquis hold for this patient who has a Pre Visit scheduled on 03/25/2022 with a recall colon scheduled with Dr. Loletha Carrow on 04/22/2022.    Please/Thank you

## 2022-03-22 ENCOUNTER — Telehealth: Payer: Self-pay

## 2022-03-22 ENCOUNTER — Other Ambulatory Visit: Payer: Self-pay | Admitting: Family Medicine

## 2022-03-22 NOTE — Telephone Encounter (Signed)
Request has been sent to Cardiology

## 2022-03-22 NOTE — Telephone Encounter (Signed)
[facility] Pre-operative Risk Assessment     Request for surgical clearance:     Endoscopy Procedure  What type of surgery is being performed?     Colonoscopy   When is this surgery scheduled?     04-22-2022  What type of clearance is required ?   Pharmacy  Are there any medications that need to be held prior to surgery and how long? Yes, Eliquis 2 days   Practice name and name of physician performing surgery?      [facility]  What is your office phone and fax number?      Phone- [phone]  [phone]  Anesthesia type (None, local, MAC, general) ?       MAC

## 2022-03-22 NOTE — Telephone Encounter (Signed)
Any word on the eliquis hold, Vivien Rota? Thank you, Etheleen Nicks RN

## 2022-03-22 NOTE — Telephone Encounter (Signed)
Requesting: lorazepam '1mg'$   Contract: 07/07/21 UDS: 07/07/21 Last Visit: 03/09/22 Next Visit: 09/21/22 Last Refill: 07/07/21 #30 and 5RF  Please Advise

## 2022-03-24 DIAGNOSIS — H469 Unspecified optic neuritis: Secondary | ICD-10-CM | POA: Diagnosis not present

## 2022-03-25 ENCOUNTER — Ambulatory Visit (AMBULATORY_SURGERY_CENTER): Payer: Self-pay

## 2022-03-25 ENCOUNTER — Telehealth: Payer: Self-pay

## 2022-03-25 VITALS — Ht 71.0 in | Wt 188.0 lb

## 2022-03-25 DIAGNOSIS — Z86010 Personal history of colonic polyps: Secondary | ICD-10-CM

## 2022-03-25 MED ORDER — PEG 3350-KCL-NA BICARB-NACL 420 G PO SOLR: 4000.0000 mL | Freq: Once | ORAL | 0 refills | Status: AC

## 2022-03-25 NOTE — Telephone Encounter (Signed)
At [facility] today, Pt is aware we have not yet received clearance to hold Eliquis. He will call back next week if he has not received instructions.  Colonoscopy is scheduled for 04/22/22.

## 2022-03-25 NOTE — Telephone Encounter (Signed)
Voicemail to home number to r/s PV;  Pt arrived late to [facility] and will disregard voicemail.

## 2022-03-25 NOTE — Progress Notes (Signed)
No egg or soy allergy known to patient  No issues known to pt with past sedation with any surgeries or procedures Patient denies ever being told they had issues or difficulty with intubation  No FH of Malignant Hyperthermia Pt is not on diet pills Pt is not on  home 02  Pt denies issues with constipation  No A flutter. A. Fib persists, on Eliquis, s/p 2 ablations. Have any cardiac testing pending--denied Pt instructed to use [employer] or GoodRx for a price reduction on prep   Pt is aware Eliquis is typically stopped 2 days prior to colonoscopy, but we have not yet received official clearance from cardiology to hold his Eliquis. If he does not hear from [place] in the next week, he will call to ensure it is ok to proceed.  Patient's chart reviewed by Osvaldo Angst CNRA prior to previsit and patient appropriate for the [place].  Previsit completed and red dot placed by patient's name on their procedure day (on provider's schedule).

## 2022-03-28 NOTE — Telephone Encounter (Signed)
Patient with diagnosis of atrial fibrillation on Eliquis for anticoagulation.    Procedure: colonoscopy Date of procedure: 04/22/22   CHA2DS2-VASc Score = 5   This indicates a 7.2% annual risk of stroke. The patient's score is based upon: CHF History: 1 HTN History: 1 Diabetes History: 1 Stroke History: 0 Vascular Disease History: 1 Age Score: 1 Gender Score: 0   CrCl 52 Platelet count 162  Per office protocol, patient can hold Eliquis for 2 days prior to procedure.   Patient will not need bridging with Lovenox (enoxaparin) around procedure.  **This guidance is not considered finalized until pre-operative APP has relayed final recommendations.**

## 2022-03-29 ENCOUNTER — Telehealth: Payer: Self-pay | Admitting: *Deleted

## 2022-03-29 NOTE — Telephone Encounter (Signed)
Pt agreeable to plan of care for tele pre op appt 04/16/22 @ 10:20. Med rec and consent are done.     Patient Consent for Virtual Visit        [name] has provided verbal consent on 03/29/2022 for a virtual visit (video or telephone).   CONSENT FOR VIRTUAL VISIT FOR:  [name]  By participating in this virtual visit I agree to the following:  I hereby voluntarily request, consent and authorize [facility] and its employed or contracted physicians, physician assistants, nurse practitioners or other licensed health care professionals (the Practitioner), to provide me with telemedicine health care services (the "Services") as deemed necessary by the treating Practitioner. I acknowledge and consent to receive the Services by the Practitioner via telemedicine. I understand that the telemedicine visit will involve communicating with the Practitioner through live audiovisual communication technology and the disclosure of certain medical information by electronic transmission. I acknowledge that I have been given the opportunity to request an in-person assessment or other available alternative prior to the telemedicine visit and am voluntarily participating in the telemedicine visit.  I understand that I have the right to withhold or withdraw my consent to the use of telemedicine in the course of my care at any time, without affecting my right to future care or treatment, and that the Practitioner or I may terminate the telemedicine visit at any time. I understand that I have the right to inspect all information obtained and/or recorded in the course of the telemedicine visit and may receive copies of available information for a reasonable fee.  I understand that some of the potential risks of receiving the Services via telemedicine include:  Delay or interruption in medical evaluation due to technological equipment failure or disruption; Information transmitted may not be  sufficient (e.g. poor resolution of images) to allow for appropriate medical decision making by the Practitioner; and/or  In rare instances, security protocols could fail, causing a breach of personal health information.  Furthermore, I acknowledge that it is my responsibility to provide information about my medical history, conditions and care that is complete and accurate to the best of my ability. I acknowledge that Practitioner's advice, recommendations, and/or decision may be based on factors not within their control, such as incomplete or inaccurate data provided by me or distortions of diagnostic images or specimens that may result from electronic transmissions. I understand that the practice of medicine is not an exact science and that Practitioner makes no warranties or guarantees regarding treatment outcomes. I acknowledge that a copy of this consent can be made available to me via my patient portal ([facility]), or I can request a printed copy by calling the office of [facility].    I understand that my insurance will be billed for this visit.   I have read or had this consent read to me. I understand the contents of this consent, which adequately explains the benefits and risks of the Services being provided via telemedicine.  I have been provided ample opportunity to ask questions regarding this consent and the Services and have had my questions answered to my satisfaction. I give my informed consent for the services to be provided through the use of telemedicine in my medical care

## 2022-03-29 NOTE — Telephone Encounter (Signed)
Pt agreeable to plan of care for tele pre op appt 04/16/22 @ 10:20. Med rec and consent are done.

## 2022-03-29 NOTE — Telephone Encounter (Signed)
Vivien Rota, just wanted to make sure Dr. Loletha Carrow is ok with this Eliquis hold...  Thanks, Aaron Edelman (PV)

## 2022-03-29 NOTE — Telephone Encounter (Signed)
   Name: [name]  DOB: [birth year]  MRN: [id number]  Primary Cardiologist: Sanda Klein, MD   Preoperative team, please contact this patient and set up a phone call appointment for further preoperative risk assessment. Please obtain consent and complete medication review. Thank you for your help.  I confirm that guidance regarding antiplatelet and oral anticoagulation therapy has been completed and, if necessary, noted below.  Per office protocol, patient can hold Eliquis for 2 days prior to procedure.   Patient will not need bridging with Lovenox (enoxaparin) around procedure.     Deberah Pelton, NP 03/29/2022, 8:36 AM [place]

## 2022-03-31 DIAGNOSIS — H524 Presbyopia: Secondary | ICD-10-CM | POA: Diagnosis not present

## 2022-04-05 ENCOUNTER — Ambulatory Visit: Payer: Self-pay | Admitting: Licensed Clinical Social Worker

## 2022-04-05 NOTE — Patient Outreach (Signed)
  Care Coordination   Initial Visit Note   04/05/2022 Name: YOSHITO GAZA MRN: [id number] DOB: 09-28-1950  LEAMAN ABE is a 71 y.o. year old male who sees [name], MD for primary care. I spoke with  [name] by phone today.  What matters to the patients health and wellness today? Client did not mention any care needs at present.     Goals Addressed               This Visit's Progress     Patient Stated he is doing well and feel great. He did not mention any needs at present but will remember name of program if he needs program support (pt-stated)        Intervention: Discussed program support with client Client said he has support of his PCP Client has some support from his son. Client reported that he is feeling well and did not have any particular care needs at this time Encouraged Jakeim to talk with PCP about Care Coordination program. Encouraged Deyonte to remember  program support availability if he should need program help in the future.     SDOH assessments and interventions completed:  Yes  SDOH Interventions Today    Flowsheet Row Most Recent Value  SDOH Interventions   Stress Interventions Other (Comment)  [client has difficulty sleeping]        Care Coordination Interventions Activated:  Yes  Care Coordination Interventions:  Yes, provided   Follow up plan: No further intervention required.at this time  Encounter Outcome:  Pt. Visit Completed   Norva Riffle.Gwen Sarvis MSW, [place] [employer] representative [facility] Care Management [phone]

## 2022-04-05 NOTE — Patient Instructions (Signed)
Visit Information  Thank you for taking time to visit with me today. Please don't hesitate to contact me if I can be of assistance to you before our next scheduled telephone appointment.  Following are the goals we discussed today:   No further intervention needed at this time  Please call the care guide team at [phone] if you need to cancel or reschedule your appointment.   If you are experiencing a Mental Health or [place] or need someone to talk to, please go to [facility] [place] [phone])   Following is a copy of your full plan of care:   Intervention: Discussed program support with client Client said he has support of his PCP Client has some support from his son. Client reported that he is feeling well and did not have any particular care needs at this time Encouraged [name] to talk with PCP about Care Coordination program. Encouraged [name] to remember  program support availability if he should need program help in the future.  [name] was given information about Care Management services by the embedded care coordination team including:  Care Management services include personalized support from designated clinical staff supervised by his physician, including individualized plan of care and coordination with other care providers 24/7 contact phone numbers for assistance for urgent and routine care needs. The patient may stop CCM services at any time (effective at the end of the month) by phone call to the office staff.  Patient agreed to services and verbal consent obtained.   Norva Riffle.Vedant Shehadeh MSW, [place] [employer] representative [facility] Care Management [phone]

## 2022-04-07 ENCOUNTER — Other Ambulatory Visit: Payer: Self-pay | Admitting: Family Medicine

## 2022-04-07 NOTE — Telephone Encounter (Signed)
Requesting: temazepam '30mg'$   Contract:07/07/21 UDS: 07/07/21 Last Visit: 03/09/22 Next Visit: 09/21/22 Last Refill: 03/09/22 #30 and 0RF   Please Advise

## 2022-04-14 ENCOUNTER — Encounter: Payer: Self-pay | Admitting: Gastroenterology

## 2022-04-14 ENCOUNTER — Ambulatory Visit: Payer: Medicare HMO | Attending: Cardiology | Admitting: Cardiology

## 2022-04-14 ENCOUNTER — Encounter: Payer: Self-pay | Admitting: Cardiology

## 2022-04-14 VITALS — BP 130/74 | HR 83 | Ht 71.0 in | Wt 186.4 lb

## 2022-04-14 DIAGNOSIS — I10 Essential (primary) hypertension: Secondary | ICD-10-CM | POA: Diagnosis not present

## 2022-04-14 DIAGNOSIS — I4819 Other persistent atrial fibrillation: Secondary | ICD-10-CM | POA: Diagnosis not present

## 2022-04-14 DIAGNOSIS — D6869 Other thrombophilia: Secondary | ICD-10-CM

## 2022-04-14 NOTE — Progress Notes (Signed)
Electrophysiology Office Note   Date:  04/14/2022   ID:  Kevin, [name] 11-10-1950, MRN [id number]  PCP:  [name], MD  Cardiologist:  [name] Primary Electrophysiologist:  Alisia Vanengen Meredith [name], MD    No chief complaint on file.     History of Present Illness: [name] is a 71 y.o. male who is being seen today for the evaluation of atrial fibrillation at the request of [name]. Presenting today for Linq follow-up.  He has a history significant for atrial fibrillation, atrial flutter, aortic insufficiency, hyperlipidemia, OSA, hypertension, type 2 diabetes, diastolic heart failure.  He had an ablation 05/16/2019. He had rapid atrial fibrillation and went into heart failure during the ablation. He was in acute heart failure at the time of ablation requiring a long hospital stay post procedure.  He went back into atrial fibrillation with repeat ablation 03/10/2020.  At that time his posterior wall and pulmonary veins were isolated.  He had ablation of the anterior wall of the left atrium.  Today, denies symptoms of palpitations, chest pain, shortness of breath, orthopnea, PND, lower extremity edema, claudication, dizziness, presyncope, syncope, bleeding, or neurologic sequela. The patient is tolerating medications without difficulties.  Since being seen he has done well.  He has had no chest pain or shortness of breath.  He is able to all of his daily activities without restriction.  He is overall happy with his control and has no acute complaints or concerns.   He enjoys wood working and is already beginning to [employer] for friends and family. Does not notice increased bleeding.  States his dry weight is about 182lb  Past Medical History:  Diagnosis Date   AORTIC STENOSIS, MODERATE 12/05/2009   Arthritis    Atrial fibrillation (HCC)    Atrial fibrillation with RVR ([place]) 07/05/2017   Atrial flutter ([place]) 12/05/2009   Atypical chest pain 11/08/2011    BENIGN PROSTATIC HYPERTROPHY, HX OF 12/05/2009   CHEST PAIN-UNSPECIFIED 11/11/2009   CHF 12/05/2009   Chronic renal insufficiency 12/16/2016   Debility 04/18/2012   Diarrhea 12/16/2016   DYSPNEA 12/19/2009   Edema 04/18/2012   ERECTILE DYSFUNCTION 01/25/2007   FATIGUE, CHRONIC 11/11/2009   Gout 07/10/2012   Heart murmur    History of kidney stones    "they passed" (07/05/2017)   Hyperkalemia 04/18/2012   Hyperlipidemia    Hypertension    HYPERTENSION 01/25/2007   Hypoxia 05/05/2011   Insomnia 05/13/2016   INSOMNIA, HX OF 01/25/2007   Knee pain, bilateral 12/28/2010   Knee pain, right 12/28/2010   NEUROMA 01/05/2010   OBESITY NOS 01/25/2007   OSA on CPAP 04/06/2010   PERIPHERAL NEUROPATHY 01/05/2010   PULMONARY FUNCTION TESTS, ABNORMAL 02/02/2010   Sleep apnea    wears CPAP   Superficial thrombophlebitis of left leg 09/07/2011   TESTICULAR HYPOFUNCTION 01/05/2010   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   Toe pain, right 07/10/2012   Type II diabetes mellitus ([place]) 12/05/2009   UNSPECIFIED ANEMIA 01/05/2010   Past Surgical History:  Procedure Laterality Date   A-FLUTTER ABLATION N/A 05/16/2019   Procedure: A-FLUTTER ABLATION;  Surgeon: Constance Haw, MD;  Location: [facility] [facility];  Service: Cardiovascular;  Laterality: N/A;   ATRIAL FIBRILLATION ABLATION N/A 05/16/2019   Procedure: ATRIAL FIBRILLATION ABLATION;  Surgeon: Constance Haw, MD;  Location: [facility];  Service: Cardiovascular;  Laterality: N/A;   ATRIAL FIBRILLATION ABLATION N/A 03/10/2020   Procedure: ATRIAL FIBRILLATION ABLATION;  Surgeon: Curt Bears,  Ocie Doyne, MD;  Location: [facility];  Service: Cardiovascular;  Laterality: N/A;   CARDIAC CATHETERIZATION  10/16/2009   nonischemic cardiomyopathy   CARDIOVERSION N/A 03/25/2015   Procedure: CARDIOVERSION;  Surgeon: Pixie Casino, MD;  Location: [facility];  Service: Cardiovascular;  Laterality: N/A;   CARDIOVERSION N/A 06/17/2017    Procedure: CARDIOVERSION;  Surgeon: Sanda Klein, MD;  Location: [facility];  Service: Cardiovascular;  Laterality: N/A;   CARDIOVERSION N/A 07/08/2017   Procedure: CARDIOVERSION;  Surgeon: Fay Records, MD;  Location: [facility];  Service: Cardiovascular;  Laterality: N/A;   CARDIOVERSION N/A 02/27/2019   Procedure: CARDIOVERSION;  Surgeon: Sanda Klein, MD;  Location: [facility];  Service: Cardiovascular;  Laterality: N/A;   CARDIOVERSION N/A 08/16/2019   Procedure: CARDIOVERSION;  Surgeon: Sanda Klein, MD;  Location: [facility];  Service: Cardiovascular;  Laterality: N/A;   COLONOSCOPY  03/10/2021   1st colonoscopy , Wilfrid Lund, [facility], large transverse polyp, clip and spot applied   METATARSAL OSTEOTOMY Bilateral ~ 1980   removed part of 5th metatarsal to corect curvature of toes    RIGHT HEART CATH N/A 05/16/2019   Procedure: RIGHT HEART CATH;  Surgeon: Jolaine Artist, MD;  Location: [facility];  Service: Cardiovascular;  Laterality: N/A;   TEE WITHOUT CARDIOVERSION  05/16/2019   Procedure: Transesophageal Echocardiogram ([place]);  Surgeon: Constance Haw, MD;  Location: [facility];  Service: Cardiovascular;;   TEE WITHOUT CARDIOVERSION N/A 03/07/2020   Procedure: TRANSESOPHAGEAL ECHOCARDIOGRAM (TEE);  Surgeon: Freada Bergeron, MD;  Location: [facility];  Service: Cardiovascular;  Laterality: N/A;     Current Outpatient Medications  Medication Sig Dispense Refill   Accu-Chek Softclix Lancets lancets Use as instructed 200 each 3   albuterol (VENTOLIN HFA) 108 (90 Base) MCG/ACT inhaler Inhale 2 puffs into the lungs every 6 (six) hours as needed for wheezing or shortness of breath. 1 Inhaler 6   Alcohol Swabs (DROPSAFE ALCOHOL PREP) 70 % PADS USE AS DIRECTED 300 each 1   allopurinol (ZYLOPRIM) 300 MG tablet Take 1 tablet by mouth once daily 90 tablet 1   apixaban (ELIQUIS) 5 MG TABS tablet Take 1 tablet by mouth twice daily 60 tablet 5    Blood Glucose Monitoring Suppl (ACCU-CHEK GUIDE ME) w/Device KIT USE TO TEST BLOOD SUGAR ONCE TO TWICE DAILY 1 kit 0   calcium carbonate (TUMS - DOSED IN MG ELEMENTAL CALCIUM) 500 MG chewable tablet Chew 500 mg by mouth daily as needed for indigestion or heartburn.     cetirizine (ZYRTEC) 10 MG tablet Take 1 tablet (10 mg total) by mouth daily. 30 tablet 11   clobetasol ointment (TEMOVATE) 3.35 % 1 application to affected area     Clobetasol Prop Emollient Base (CLOBETASOL PROPIONATE E) 0.05 % emollient cream Apply 1 application topically 2 (two) times daily as needed (insect bite). 60 g 1   colchicine (COLCRYS) 0.6 MG tablet Take one tablet for 4-6 days as needed for gout flare up. 15 tablet 3   dapagliflozin propanediol (FARXIGA) 10 MG TABS tablet TAKE 1 TABLET BY MOUTH ONCE DAILY 90 tablet 3   fish oil-omega-3 fatty acids 1000 MG capsule Take 1 g by mouth daily.     FLUZONE HIGH-DOSE QUADRIVALENT 0.7 ML SUSY      furosemide (LASIX) 80 MG tablet Take 1 tablet (80 mg total) by mouth daily. As needed patient may take an additional 40 MG tablet as direct per [employer] study PRN plan. [place]  tablet 3   gabapentin (NEURONTIN) 100 MG capsule Take 2 capsules (200 mg total) by mouth at bedtime. 180 capsule 3   glucose blood (ACCU-CHEK GUIDE) test strip USE TO TEST BLOOD SUGAR ONCE TO TWICE DAILY 100 each 0   hydrALAZINE (APRESOLINE) 25 MG tablet TAKE 1 TABLET BY MOUTH THREE TIMES DAILY 270 tablet 0   HYDROcodone-acetaminophen (NORCO) 10-325 MG tablet Take 1 tablet by mouth every 8 (eight) hours as needed for moderate pain or severe pain. 90 tablet 0   hydrocortisone 2.5 % ointment Apply topically 2 (two) times daily as needed. 30 g 1   isosorbide mononitrate (IMDUR) 30 MG 24 hr tablet Take 1 tablet by mouth once daily 90 tablet 0   LORazepam (ATIVAN) 1 MG tablet TAKE 1 TABLET BY MOUTH EVERY 8 HOURS AS NEEDED FOR ANXIETY 30 tablet 0   magnesium oxide (MAG-OX) 400 MG tablet Take 1 tablet (400 mg total)  by mouth 2 (two) times daily. 30 tablet 11   metoprolol succinate (TOPROL-XL) 50 MG 24 hr tablet Take 1 tablet (50 mg total) by mouth in the morning and at bedtime. WITH OR FOLLOWING A MEAL 180 tablet 3   Multiple Vitamin (MULTIVITAMIN WITH MINERALS) TABS tablet Take 1 tablet by mouth daily.      Polyethyl Glycol-Propyl Glycol (SYSTANE OP) Place 1 drop into both eyes daily as needed (dry eyes).     pravastatin (PRAVACHOL) 40 MG tablet Take 1 tablet (40 mg total) by mouth daily. 90 tablet 3   Semaglutide,0.25 or 0.5MG/DOS, (OZEMPIC, 0.25 OR 0.5 MG/DOSE,) 2 MG/1.5ML SOPN Inject 0.5 mg into the skin once a week. 4.5 mL 3   tamsulosin (FLOMAX) 0.4 MG CAPS capsule Take 1 capsule by mouth once daily 30 capsule 0   temazepam (RESTORIL) 30 MG capsule Take 1 capsule by mouth at bedtime 30 capsule 0   tiZANidine (ZANAFLEX) 2 MG tablet Take 0.5-1 tablets (1-2 mg total) by mouth at bedtime as needed for muscle spasms. 3020 tablet 1   VITAMIN D PO Take by mouth.     white petrolatum (VASELINE) GEL See admin instructions.     potassium chloride SA (KLOR-CON M20) 20 MEQ tablet Take 1 tablet (20 mEq total) by mouth as directed. 1 tablet daily 20 mEq by mouth as directed per alleviate research protocol. 90 tablet 3   No current facility-administered medications for this visit.    Allergies:   Sulfonamide derivatives and Zolpidem   Social History:  The patient  reports that he quit smoking about 41 years ago. His smoking use included cigarettes. He has a 10.00 pack-year smoking history. He has never used smokeless tobacco. He reports that he does not drink alcohol and does not use drugs.   Family History:  The patient's family history includes Aneurysm in his sister; Cancer in his father; Clotting disorder in his mother; Diabetes in his mother and sister; Heart attack in his mother; Heart disease in his mother; Hyperlipidemia in his mother; Hypertension in his mother and sister; Leukemia in his maternal  grandmother; Lung disease in his father; Other in his sister; Seizures in his sister; Stroke in his mother.   ROS:  Please see the history of present illness.   Otherwise, review of systems is positive for none.   All other systems are reviewed and negative.   PHYSICAL EXAM: VS:  BP 130/74   Pulse 83   Ht _0  (1.803 m)   Wt 186 lb 6.4 oz (84.6 kg)  SpO2 96%   BMI 26.00 kg/m  , BMI Body mass index is 26 kg/m. GEN: Well nourished, well developed, in no acute distress  HEENT: normal  Neck: no JVD, carotid bruits, or masses Cardiac: RRR; no murmurs, rubs, or gallops, no edema  Respiratory:  clear to auscultation bilaterally, normal work of breathing GI: soft, nontender, nondistended, + BS MS: no deformity or atrophy  Skin: warm and dry, device site well healed Neuro:  Strength and sensation are intact Psych: euthymic mood, full affect  EKG:  EKG is ordered today. Personal review of the ekg ordered shows sinus rhythm, rate 73 with 1st degree AV block.  Personal review of the device interrogation today. Results in [facility]: 05/13/2021: BNP 121.7 03/09/2022: ALT 14; BUN 26; Creatinine, Ser 1.61; Hemoglobin 14.1; Platelets 162.0; Potassium 4.2; Sodium 142; TSH 1.91    Lipid Panel     Component Value Date/Time   CHOL 138 03/09/2022 1159   TRIG 86.0 03/09/2022 1159   HDL 39.80 03/09/2022 1159   CHOLHDL 3 03/09/2022 1159   VLDL 17.2 03/09/2022 1159   LDLCALC 81 03/09/2022 1159   LDLDIRECT 84.0 05/02/2018 1523     Wt Readings from Last 3 Encounters:  04/14/22 186 lb 6.4 oz (84.6 kg)  03/25/22 188 lb (85.3 kg)  03/09/22 187 lb 9.6 oz (85.1 kg)      Other studies Reviewed: Additional studies/ records that were reviewed today include: TTE 07/21/21 Review of the above records today demonstrates:   1. Left ventricular ejection fraction, by estimation, is 45 to 50%. The  left ventricle has mildly decreased function. The left ventricle  demonstrates global  hypokinesis. The left ventricular internal cavity size  was mildly dilated. There is mild left  ventricular hypertrophy. Left ventricular diastolic parameters are  indeterminate.   2. Right ventricular systolic function is mildly reduced. The right  ventricular size is mildly enlarged. There is moderately elevated  pulmonary artery systolic pressure.   3. Left atrial size was moderately dilated.   4. Right atrial size was mildly dilated.   5. The mitral valve is abnormal. Mild mitral valve regurgitation. No  evidence of mitral stenosis.   6. Tricuspid valve regurgitation is moderate.   7. The aortic valve is tricuspid. There is mild calcification of the  aortic valve. Aortic valve regurgitation is moderate. Aortic valve  sclerosis is present, with no evidence of aortic valve stenosis.   8. Pulmonic valve regurgitation is moderate.   9. There is mild dilatation of the aortic root, measuring 42 mm. There is  moderate dilatation of the ascending aorta, measuring 43 mm.  10. The inferior vena cava is normal in size with greater than 50%  respiratory variability, suggesting right atrial pressure of 3 mmHg.   ASSESSMENT AND PLAN:  1.  Persistent atrial fibrillation/flutter: Currently on Eliquis 5 mg twice daily.  CHA2DS2-VASc Score = 5 (CHF, HTN, T2DM, Vasc Dz, Age)  Is status post ablation 65/12/9036 complicated by admission to the hospital for heart failure as well as repeat ablation for atypical atrial flutter 03/10/2020.  He has a LINQ in place for afib monitoring, 0% burden currently. Given his tachy-mediated HF, Anyi Fels continue AC. No change to medications.   2.  Chronic systolic heart failure due to nonischemic cardiomyopathy: Ejection fraction recovered to 45-50%. Currently on Toprol-XL, Imdur hydralazine, lasix.  No changes.  3.  Hypertension: Currently well controlled   Current medicines are reviewed at length with the patient today.  The patient does not have concerns regarding his  medicines.  The following changes were made today: None  Labs/ tests ordered today include:  No orders of the defined types were placed in this encounter.     Disposition:   FU with Jaxson Keener 6 months  Signed, Chao Blazejewski Meredith [name], MD  04/14/2022 4:40 PM     [address] [place] [address] [phone] (office) [phone] (fax)  I have seen and examined this patient with Mamie Levers.  Agree with above, note added to reflect my findings.  Presents to clinic today for follow-up of his atrial fibrillation.  Over the last few months he has felt well.  He is noted no further episodes of atrial fibrillation.  He has been able to do all of his daily activities without restriction.  Overall quite happy with how he has been feeling.  GEN: Well nourished, well developed, in no acute distress  HEENT: normal  Neck: no JVD, carotid bruits, or masses Cardiac: RRR; 2 out of 6 diastolic murmur Respiratory:  clear to auscultation bilaterally, normal work of breathing GI: soft, nontender, nondistended, + BS MS: no deformity or atrophy  Skin: warm and dry Neuro:  Strength and sensation are intact Psych: euthymic mood, full affect   Persistent atrial fibrillation/atrial flutter: Currently on Eliquis 5 mg daily.  CHA2DS2-VASc of 5.  Is status post ablation 03/10/2020.  He is remained in sinus rhythm.  Low burden of atrial fibrillation noted on ILR.  Continue with current management Chronic systolic heart failure: Due to nonischemic cardiomyopathy.  Ejection fraction 45 to 50%.  Plan per primary cardiology. Aortic insufficiency: Plan for repeat echo per primary cardiology. Secondary hypercoagulable state: Currently on Eliquis for atrial fibrillation as above Hypertension: Currently well controlled  Oseas Detty M. Shenice Dolder MD 04/14/2022 4:40 PM

## 2022-04-15 ENCOUNTER — Encounter: Payer: Self-pay | Admitting: Certified Registered Nurse Anesthetist

## 2022-04-15 NOTE — Progress Notes (Signed)
Virtual Visit via Telephone Note   Because of [name]'s co-morbid illnesses, he is at least at moderate risk for complications without adequate follow up.  This format is felt to be most appropriate for this patient at this time.  The patient did not have access to video technology/had technical difficulties with video requiring transitioning to audio format only (telephone).  All issues noted in this document were discussed and addressed.  No physical exam could be performed with this format.  Please refer to the patient's chart for his consent to telehealth for [facility].  Evaluation Performed:  Preoperative cardiovascular risk assessment _____________   Date:  04/15/2022   Patient ID:  [name], DOB February 16, 1951, MRN [id number] Patient Location:  Home Provider location:   Office  Primary Care Provider:  [name], MD Primary Cardiologist:  Sanda Klein, MD  Chief Complaint / Patient Profile   71 y.o. y/o male with a h/o chronic combined CHF, paroxysmal atrial flutter, paroxysmal atrial fibrillation s/p endocardial ablation 01/2020, tachycardia induced cardiomyopathy, moderate AI, OSA on CPAP, HTN, HLD, obesity, type 2 diabetes who is pending colonoscopy and presents today for telephonic preoperative cardiovascular risk assessment.  Past Medical History    Past Medical History:  Diagnosis Date   AORTIC STENOSIS, MODERATE 12/05/2009   Arthritis    Atrial fibrillation (HCC)    Atrial fibrillation with RVR (HCC) 07/05/2017   Atrial flutter ([place]) 12/05/2009   Atypical chest pain 11/08/2011   BENIGN PROSTATIC HYPERTROPHY, HX OF 12/05/2009   CHEST PAIN-UNSPECIFIED 11/11/2009   CHF 12/05/2009   Chronic renal insufficiency 12/16/2016   Debility 04/18/2012   Diarrhea 12/16/2016   DYSPNEA 12/19/2009   Edema 04/18/2012   ERECTILE DYSFUNCTION 01/25/2007   FATIGUE, CHRONIC 11/11/2009   Gout 07/10/2012   Heart murmur    History of kidney stones     "they passed" (07/05/2017)   Hyperkalemia 04/18/2012   Hyperlipidemia    Hypertension    HYPERTENSION 01/25/2007   Hypoxia 05/05/2011   Insomnia 05/13/2016   INSOMNIA, HX OF 01/25/2007   Knee pain, bilateral 12/28/2010   Knee pain, right 12/28/2010   NEUROMA 01/05/2010   OBESITY NOS 01/25/2007   OSA on CPAP 04/06/2010   PERIPHERAL NEUROPATHY 01/05/2010   PULMONARY FUNCTION TESTS, ABNORMAL 02/02/2010   Sleep apnea    wears CPAP   Superficial thrombophlebitis of left leg 09/07/2011   TESTICULAR HYPOFUNCTION 01/05/2010   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   Toe pain, right 07/10/2012   Type II diabetes mellitus ([place]) 12/05/2009   UNSPECIFIED ANEMIA 01/05/2010   Past Surgical History:  Procedure Laterality Date   A-FLUTTER ABLATION N/A 05/16/2019   Procedure: A-FLUTTER ABLATION;  Surgeon: Constance Haw, MD;  Location: [place] [facility];  Service: Cardiovascular;  Laterality: N/A;   ATRIAL FIBRILLATION ABLATION N/A 05/16/2019   Procedure: ATRIAL FIBRILLATION ABLATION;  Surgeon: Constance Haw, MD;  Location: [place] [facility];  Service: Cardiovascular;  Laterality: N/A;   ATRIAL FIBRILLATION ABLATION N/A 03/10/2020   Procedure: ATRIAL FIBRILLATION ABLATION;  Surgeon: Constance Haw, MD;  Location: [place] [facility];  Service: Cardiovascular;  Laterality: N/A;   CARDIAC CATHETERIZATION  10/16/2009   nonischemic cardiomyopathy   CARDIOVERSION N/A 03/25/2015   Procedure: CARDIOVERSION;  Surgeon: Pixie Casino, MD;  Location: [place];  Service: Cardiovascular;  Laterality: N/A;   CARDIOVERSION N/A 06/17/2017   Procedure: CARDIOVERSION;  Surgeon: Sanda Klein, MD;  Location: [place];  Service: Cardiovascular;  Laterality: N/A;  CARDIOVERSION N/A 07/08/2017   Procedure: CARDIOVERSION;  Surgeon: Fay Records, MD;  Location: [place];  Service: Cardiovascular;  Laterality: N/A;   CARDIOVERSION N/A 02/27/2019   Procedure: CARDIOVERSION;  Surgeon: Sanda Klein, MD;  Location: [place];  Service: Cardiovascular;  Laterality: N/A;   CARDIOVERSION N/A 08/16/2019   Procedure: CARDIOVERSION;  Surgeon: Sanda Klein, MD;  Location: [place];  Service: Cardiovascular;  Laterality: N/A;   COLONOSCOPY  03/10/2021   1st colonoscopy , Wilfrid Lund, [facility], large transverse polyp, clip and spot applied   METATARSAL OSTEOTOMY Bilateral ~ 1980   removed part of 5th metatarsal to corect curvature of toes    RIGHT HEART CATH N/A 05/16/2019   Procedure: RIGHT HEART CATH;  Surgeon: Jolaine Artist, MD;  Location: [place] [facility];  Service: Cardiovascular;  Laterality: N/A;   TEE WITHOUT CARDIOVERSION  05/16/2019   Procedure: Transesophageal Echocardiogram ([place]);  Surgeon: Constance Haw, MD;  Location: [place] [facility];  Service: Cardiovascular;;   TEE WITHOUT CARDIOVERSION N/A 03/07/2020   Procedure: TRANSESOPHAGEAL ECHOCARDIOGRAM (TEE);  Surgeon: Freada Bergeron, MD;  Location[facility];  Service: Cardiovascular;  Laterality: N/A;    Allergies  Allergies  Allergen Reactions   Sulfonamide Derivatives Other (See Comments)    Flu like symptoms    Zolpidem Other (See Comments)    Excessive, prolonged sedation    History of Present Illness    [name] is a 71 y.o. male who presents via audio/video conferencing for a telehealth visit today.  Pt was last seen in cardiology clinic on 04/14/22 by Dr. Curt Bears.  At that time [name] was doing well. The patient is now pending procedure as outlined above. Since his last visit, he  denies chest pain, shortness of breath, lower extremity edema, fatigue, palpitations, melena, hematuria, hemoptysis, diaphoresis, weakness, presyncope, syncope, orthopnea, and PND. Remains active doing wood working, yard work and various other activities.   Home Medications    Prior to Admission medications   Medication Sig Start Date End Date Taking? Authorizing Provider  Accu-Chek  Softclix Lancets lancets Use as instructed 10/21/20   Philemon Kingdom, MD  albuterol (VENTOLIN HFA) 108 (90 Base) MCG/ACT inhaler Inhale 2 puffs into the lungs every 6 (six) hours as needed for wheezing or shortness of breath. 12/16/16   [name], MD  Alcohol Swabs (DROPSAFE ALCOHOL PREP) 70 % PADS USE AS DIRECTED 11/30/21   [name], MD  allopurinol (ZYLOPRIM) 300 MG tablet Take 1 tablet by mouth once daily 01/11/22   [name], MD  apixaban Arne Cleveland) 5 MG TABS tablet Take 1 tablet by mouth twice daily 12/22/21   Camnitz, Ocie Doyne, MD  Blood Glucose Monitoring Suppl (ACCU-CHEK GUIDE ME) w/Device KIT USE TO TEST BLOOD SUGAR ONCE TO TWICE DAILY 02/01/22   Philemon Kingdom, MD  calcium carbonate (TUMS - DOSED IN MG ELEMENTAL CALCIUM) 500 MG chewable tablet Chew 500 mg by mouth daily as needed for indigestion or heartburn.    [provider]  cetirizine (ZYRTEC) 10 MG tablet Take 1 tablet (10 mg total) by mouth daily. 11/01/14   [name], MD  clobetasol ointment (TEMOVATE) 1.44 % 1 application to affected area 06/17/15   [provider]  Clobetasol Prop Emollient Base (CLOBETASOL PROPIONATE E) 0.05 % emollient cream Apply 1 application topically 2 (two) times daily as needed (insect bite). 07/25/19   [name], MD  colchicine (COLCRYS) 0.6 MG tablet Take one tablet  for 4-6 days as needed for gout flare up. 03/27/20   Croitoru, Dani Gobble, MD  dapagliflozin propanediol (FARXIGA) 10 MG TABS tablet TAKE 1 TABLET BY MOUTH ONCE DAILY 10/21/20   Philemon Kingdom, MD  fish oil-omega-3 fatty acids 1000 MG capsule Take 1 g by mouth daily.    [provider]  FLUZONE HIGH-DOSE QUADRIVALENT 0.7 ML SUSY  05/15/21   [provider]  furosemide (LASIX) 80 MG tablet Take 1 tablet (80 mg total) by mouth daily. As needed patient may take an additional 40 MG tablet as direct per [employer] study PRN plan. 06/23/21 06/23/22  Croitoru, Mihai, MD  gabapentin  (NEURONTIN) 100 MG capsule Take 2 capsules (200 mg total) by mouth at bedtime. 07/22/20   Philemon Kingdom, MD  glucose blood (ACCU-CHEK GUIDE) test strip USE TO TEST BLOOD SUGAR ONCE TO TWICE DAILY 03/15/22   Philemon Kingdom, MD  hydrALAZINE (APRESOLINE) 25 MG tablet TAKE 1 TABLET BY MOUTH THREE TIMES DAILY 03/15/22   Camnitz, Ocie Doyne, MD  HYDROcodone-acetaminophen (NORCO) 10-325 MG tablet Take 1 tablet by mouth every 8 (eight) hours as needed for moderate pain or severe pain. 08/03/18   [name], MD  hydrocortisone 2.5 % ointment Apply topically 2 (two) times daily as needed. 09/20/19   [name], MD  isosorbide mononitrate (IMDUR) 30 MG 24 hr tablet Take 1 tablet by mouth once daily 12/11/21   Bensimhon, Shaune Pascal, MD  LORazepam (ATIVAN) 1 MG tablet TAKE 1 TABLET BY MOUTH EVERY 8 HOURS AS NEEDED FOR ANXIETY 03/22/22   [name], MD  magnesium oxide (MAG-OX) 400 MG tablet Take 1 tablet (400 mg total) by mouth 2 (two) times daily. 07/20/19   Croitoru, Mihai, MD  metoprolol succinate (TOPROL-XL) 50 MG 24 hr tablet Take 1 tablet (50 mg total) by mouth in the morning and at bedtime. WITH OR FOLLOWING A MEAL 01/19/22   Camnitz, Ocie Doyne, MD  Multiple Vitamin (MULTIVITAMIN WITH MINERALS) TABS tablet Take 1 tablet by mouth daily.     [provider]  Polyethyl Glycol-Propyl Glycol (SYSTANE OP) Place 1 drop into both eyes daily as needed (dry eyes).    [provider]  potassium chloride SA (KLOR-CON M20) 20 MEQ tablet Take 1 tablet (20 mEq total) by mouth as directed. 1 tablet daily 20 mEq by mouth as directed per alleviate research protocol. 11/20/20 01/01/22  Croitoru, Mihai, MD  pravastatin (PRAVACHOL) 40 MG tablet Take 1 tablet (40 mg total) by mouth daily. 09/29/21   Camnitz, Ocie Doyne, MD  Semaglutide,0.25 or 0.5MG/DOS, (OZEMPIC, 0.25 OR 0.5 MG/DOSE,) 2 MG/1.5ML SOPN Inject 0.5 mg into the skin once a week. 02/24/21   Philemon Kingdom, MD  tamsulosin (FLOMAX) 0.4 MG  CAPS capsule Take 1 capsule by mouth once daily 06/25/19   Clegg, Amy D, NP  temazepam (RESTORIL) 30 MG capsule Take 1 capsule by mouth at bedtime 04/07/22   [name], MD  tiZANidine (ZANAFLEX) 2 MG tablet Take 0.5-1 tablets (1-2 mg total) by mouth at bedtime as needed for muscle spasms. 07/07/21   [name], MD  VITAMIN D PO Take by mouth.    [provider]  white petrolatum (VASELINE) GEL See admin instructions. 06/17/15   [provider]    Physical Exam    Vital Signs:  [name] does not have vital signs available for review today.  Given telephonic nature of communication, physical exam is limited. AAOx3. NAD. Normal affect.  Speech  and respirations are unlabored.  Accessory Clinical Findings    None  Assessment & Plan    1.  Preoperative Cardiovascular Risk Assessment: The patient is doing well from a cardiac perspective. Therefore, based on ACC/AHA guidelines, the patient would be at acceptable risk for the planned procedure without further cardiovascular testing. According to the Revised Cardiac Risk Index (RCRI), his Perioperative Risk of Major Cardiac Event is (%): 0.4 His Functional Capacity in METs is: 7.59 according to the Duke Activity Status Index (DASI).  The patient was advised that if he develops new symptoms prior to surgery to contact our office to arrange for a follow-up visit, and he verbalized understanding.  Per office protocol, patient can hold Eliquis for 2 days prior to procedure.   Patient will not need bridging with Lovenox (enoxaparin) around procedure.  A copy of this note will be routed to requesting surgeon.  Time:   Today, I have spent 6 minutes with the patient with telehealth technology discussing medical history, symptoms, and management plan.     Emmaline Life, NP-C  04/16/2022, 10:22 AM [address], [address] Office [phone] Fax [phone]

## 2022-04-15 NOTE — Addendum Note (Signed)
Addended by: Michelle Nasuti on: 04/15/2022 09:37 AM   Modules accepted: Orders

## 2022-04-16 ENCOUNTER — Encounter: Payer: Self-pay | Admitting: Nurse Practitioner

## 2022-04-16 ENCOUNTER — Ambulatory Visit: Payer: Medicare HMO | Attending: Interventional Cardiology | Admitting: Nurse Practitioner

## 2022-04-16 DIAGNOSIS — Z01810 Encounter for preprocedural cardiovascular examination: Secondary | ICD-10-CM | POA: Diagnosis not present

## 2022-04-16 NOTE — Telephone Encounter (Signed)
Left message to return call 

## 2022-04-16 NOTE — Telephone Encounter (Signed)
Patient returning call.

## 2022-04-21 NOTE — Telephone Encounter (Signed)
Spoke to pt he did get his instructions from his cardiologist office, had no additional questions at this time

## 2022-04-22 ENCOUNTER — Encounter: Payer: Self-pay | Admitting: Gastroenterology

## 2022-04-22 ENCOUNTER — Ambulatory Visit (AMBULATORY_SURGERY_CENTER): Payer: Medicare HMO | Admitting: Gastroenterology

## 2022-04-22 VITALS — BP 109/66 | HR 71 | Temp 96.6°F | Resp 11 | Ht 71.0 in | Wt 188.0 lb

## 2022-04-22 DIAGNOSIS — Z86010 Personal history of colonic polyps: Secondary | ICD-10-CM

## 2022-04-22 DIAGNOSIS — Z09 Encounter for follow-up examination after completed treatment for conditions other than malignant neoplasm: Secondary | ICD-10-CM | POA: Diagnosis not present

## 2022-04-22 DIAGNOSIS — I10 Essential (primary) hypertension: Secondary | ICD-10-CM | POA: Diagnosis not present

## 2022-04-22 DIAGNOSIS — Z1211 Encounter for screening for malignant neoplasm of colon: Secondary | ICD-10-CM

## 2022-04-22 DIAGNOSIS — E119 Type 2 diabetes mellitus without complications: Secondary | ICD-10-CM | POA: Diagnosis not present

## 2022-04-22 DIAGNOSIS — I509 Heart failure, unspecified: Secondary | ICD-10-CM | POA: Diagnosis not present

## 2022-04-22 MED ORDER — SODIUM CHLORIDE 0.9 % IV SOLN: 500.0000 mL | Freq: Once | INTRAVENOUS | Status: DC

## 2022-04-22 NOTE — Op Note (Signed)
[facility] Patient Name: [name] Procedure Date: 04/22/2022 10:14 AM MRN: [id number] Endoscopist: [place]. Loletha Carrow , MD, [id number] Age: 71 Referring MD:  Date of Birth: [birth year] Gender: Male Account #: [id number] Procedure:                Colonoscopy Indications:              Surveillance: Piecemeal removal of large sessile                            adenoma last colonoscopy (< 3 yrs)                           15-65m sessile villous adenoma without HGD in T.C.                            - Oct 2022 Medicines:                Monitored Anesthesia Care Procedure:                Pre-Anesthesia Assessment:                           - Prior to the procedure, a History and Physical                            was performed, and patient medications and                            allergies were reviewed. The patient's tolerance of                            previous anesthesia was also reviewed. The risks                            and benefits of the procedure and the sedation                            options and risks were discussed with the patient.                            All questions were answered, and informed consent                            was obtained. Prior Anticoagulants: The patient has                            taken Eliquis (apixaban), last dose was 2 days                            prior to procedure. ASA Grade Assessment: III - A                            patient with severe systemic disease. After  reviewing the risks and benefits, the patient was                            deemed in satisfactory condition to undergo the                            procedure.                           After obtaining informed consent, the colonoscope                            was passed under direct vision. Throughout the                            procedure, the patient's blood pressure, pulse, and                            oxygen  saturations were monitored continuously. The                            Olympus CF-HQ190L (Serial# [id number]) Colonoscope was                            introduced through the anus and advanced to the the                            cecum, identified by appendiceal orifice and                            ileocecal valve. The colonoscopy was performed                            without difficulty. The patient tolerated the                            procedure well. The quality of the bowel                            preparation was good. The ileocecal valve,                            appendiceal orifice, and rectum were photographed. Scope In: 10:17:02 AM Scope Out: 10:28:34 AM Scope Withdrawal Time: 0 hours 9 minutes 25 seconds  Total Procedure Duration: 0 hours 11 minutes 32 seconds  Findings:                 The perianal and digital rectal examinations were                            normal.                           Repeat examination of right colon under NBI  performed.                           Diverticula were found in the entire colon.                           A tattoo was seen in the proximal transverse colon.                            The tattoo site appeared normal, with no visible                            polyp tissue.(examined under WL and NBI)                           The exam was otherwise without abnormality on                            direct and retroflexion views. Complications:            No immediate complications. Estimated Blood Loss:     Estimated blood loss: none. Impression:               - Diverticulosis in the entire examined colon.                           - A tattoo was seen in the proximal transverse                            colon. The tattoo site appeared normal.                           - The examination was otherwise normal on direct                            and retroflexion views.                           - No specimens  collected. Recommendation:           - Patient has a contact number available for                            emergencies. The signs and symptoms of potential                            delayed complications were discussed with the                            patient. Return to normal activities tomorrow.                            Written discharge instructions were provided to the                            patient.                           -  Resume previous diet.                           - Continue present medications.                           - Resume Eliquis (apixaban) at prior dose today.                           - Repeat colonoscopy in 5 years for surveillance. Cordon Gassett L. Loletha Carrow, MD 04/22/2022 10:36:13 AM This report has been signed electronically.

## 2022-04-22 NOTE — Progress Notes (Signed)
History and Physical:  This patient presents for endoscopic testing for: Encounter Diagnosis  Name Primary?   Personal history of colonic polyps Yes    71 year old man here today for surveillance colonoscopy.  At the time of his last colonoscopy in October 2022, a 15 to 18 mm transverse colon villous adenoma without high-grade dysplasia was removed with hot snare polypectomy. Patient denies chronic abdominal pain, rectal bleeding, constipation or diarrhea.  Patient is otherwise without complaints or active issues today.  This patient has also been off his Eliquis 2 days prior to today's procedure.  Past Medical History: Past Medical History:  Diagnosis Date   AORTIC STENOSIS, MODERATE 12/05/2009   Arthritis    Atrial fibrillation (HCC)    Atrial fibrillation with RVR (HCC) 07/05/2017   Atrial flutter ([place]) 12/05/2009   Atypical chest pain 11/08/2011   BENIGN PROSTATIC HYPERTROPHY, HX OF 12/05/2009   CHEST PAIN-UNSPECIFIED 11/11/2009   CHF 12/05/2009   Chronic renal insufficiency 12/16/2016   Debility 04/18/2012   Diarrhea 12/16/2016   DYSPNEA 12/19/2009   Edema 04/18/2012   ERECTILE DYSFUNCTION 01/25/2007   FATIGUE, CHRONIC 11/11/2009   Gout 07/10/2012   Heart murmur    History of kidney stones    "they passed" (07/05/2017)   Hyperkalemia 04/18/2012   Hyperlipidemia    Hypertension    HYPERTENSION 01/25/2007   Hypoxia 05/05/2011   Insomnia 05/13/2016   INSOMNIA, HX OF 01/25/2007   Knee pain, bilateral 12/28/2010   Knee pain, right 12/28/2010   NEUROMA 01/05/2010   OBESITY NOS 01/25/2007   OSA on CPAP 04/06/2010   PERIPHERAL NEUROPATHY 01/05/2010   PULMONARY FUNCTION TESTS, ABNORMAL 02/02/2010   Sleep apnea    wears CPAP   Superficial thrombophlebitis of left leg 09/07/2011   TESTICULAR HYPOFUNCTION 01/05/2010   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   Toe pain, right 07/10/2012   Type II diabetes mellitus ([place]) 12/05/2009   UNSPECIFIED ANEMIA 01/05/2010     Past  Surgical History: Past Surgical History:  Procedure Laterality Date   A-FLUTTER ABLATION N/A 05/16/2019   Procedure: A-FLUTTER ABLATION;  Surgeon: Constance Haw, MD;  Location: [facility];  Service: Cardiovascular;  Laterality: N/A;   ATRIAL FIBRILLATION ABLATION N/A 05/16/2019   Procedure: ATRIAL FIBRILLATION ABLATION;  Surgeon: Constance Haw, MD;  Location: [place] [facility];  Service: Cardiovascular;  Laterality: N/A;   ATRIAL FIBRILLATION ABLATION N/A 03/10/2020   Procedure: ATRIAL FIBRILLATION ABLATION;  Surgeon: Constance Haw, MD;  Location: [place] [facility];  Service: Cardiovascular;  Laterality: N/A;   CARDIAC CATHETERIZATION  10/16/2009   nonischemic cardiomyopathy   CARDIOVERSION N/A 03/25/2015   Procedure: CARDIOVERSION;  Surgeon: Pixie Casino, MD;  Location: [place];  Service: Cardiovascular;  Laterality: N/A;   CARDIOVERSION N/A 06/17/2017   Procedure: CARDIOVERSION;  Surgeon: Sanda Klein, MD;  Location: [place];  Service: Cardiovascular;  Laterality: N/A;   CARDIOVERSION N/A 07/08/2017   Procedure: CARDIOVERSION;  Surgeon: Fay Records, MD;  Location: [place];  Service: Cardiovascular;  Laterality: N/A;   CARDIOVERSION N/A 02/27/2019   Procedure: CARDIOVERSION;  Surgeon: Sanda Klein, MD;  Location: [place];  Service: Cardiovascular;  Laterality: N/A;   CARDIOVERSION N/A 08/16/2019   Procedure: CARDIOVERSION;  Surgeon: Sanda Klein, MD;  Location: [place];  Service: Cardiovascular;  Laterality: N/A;   COLONOSCOPY  03/10/2021   1st colonoscopy , Wilfrid Lund, [facility], large transverse polyp, clip and spot applied   METATARSAL OSTEOTOMY Bilateral ~ 1980   removed part of 5th  metatarsal to corect curvature of toes    RIGHT HEART CATH N/A 05/16/2019   Procedure: RIGHT HEART CATH;  Surgeon: Jolaine Artist, MD;  Location: [place] [facility];  Service: Cardiovascular;  Laterality: N/A;   TEE WITHOUT CARDIOVERSION   05/16/2019   Procedure: Transesophageal Echocardiogram ([place]);  Surgeon: Constance Haw, MD;  Location: [place];  Service: Cardiovascular;;   TEE WITHOUT CARDIOVERSION N/A 03/07/2020   Procedure: TRANSESOPHAGEAL ECHOCARDIOGRAM (TEE);  Surgeon: Freada Bergeron, MD;  Location: [facility];  Service: Cardiovascular;  Laterality: N/A;    Allergies: Allergies  Allergen Reactions   Sulfonamide Derivatives Other (See Comments)    Flu like symptoms    Zolpidem Other (See Comments)    Excessive, prolonged sedation    Outpatient Meds: Current Outpatient Medications  Medication Sig Dispense Refill   Alcohol Swabs (DROPSAFE ALCOHOL PREP) 70 % PADS USE AS DIRECTED 300 each 1   allopurinol (ZYLOPRIM) 300 MG tablet Take 1 tablet by mouth once daily 90 tablet 1   apixaban (ELIQUIS) 5 MG TABS tablet Take 1 tablet by mouth twice daily 60 tablet 5   Blood Glucose Monitoring Suppl (ACCU-CHEK GUIDE ME) w/Device KIT USE TO TEST BLOOD SUGAR ONCE TO TWICE DAILY 1 kit 0   calcium carbonate (TUMS - DOSED IN MG ELEMENTAL CALCIUM) 500 MG chewable tablet Chew 500 mg by mouth daily as needed for indigestion or heartburn.     cetirizine (ZYRTEC) 10 MG tablet Take 1 tablet (10 mg total) by mouth daily. 30 tablet 11   Clobetasol Prop Emollient Base (CLOBETASOL PROPIONATE E) 0.05 % emollient cream Apply 1 application topically 2 (two) times daily as needed (insect bite). 60 g 1   colchicine (COLCRYS) 0.6 MG tablet Take one tablet for 4-6 days as needed for gout flare up. 15 tablet 3   dapagliflozin propanediol (FARXIGA) 10 MG TABS tablet TAKE 1 TABLET BY MOUTH ONCE DAILY 90 tablet 3   fish oil-omega-3 fatty acids 1000 MG capsule Take 1 g by mouth daily.     furosemide (LASIX) 80 MG tablet Take 1 tablet (80 mg total) by mouth daily. As needed patient may take an additional 40 MG tablet as direct per [employer] study PRN plan. 90 tablet 3   glucose blood (ACCU-CHEK GUIDE) test strip USE TO  TEST BLOOD SUGAR ONCE TO TWICE DAILY 100 each 0   hydrALAZINE (APRESOLINE) 25 MG tablet TAKE 1 TABLET BY MOUTH THREE TIMES DAILY 270 tablet 0   isosorbide mononitrate (IMDUR) 30 MG 24 hr tablet Take 1 tablet by mouth once daily 90 tablet 0   LORazepam (ATIVAN) 1 MG tablet TAKE 1 TABLET BY MOUTH EVERY 8 HOURS AS NEEDED FOR ANXIETY 30 tablet 0   magnesium oxide (MAG-OX) 400 MG tablet Take 1 tablet (400 mg total) by mouth 2 (two) times daily. 30 tablet 11   metoprolol succinate (TOPROL-XL) 50 MG 24 hr tablet Take 1 tablet (50 mg total) by mouth in the morning and at bedtime. WITH OR FOLLOWING A MEAL 180 tablet 3   Multiple Vitamin (MULTIVITAMIN WITH MINERALS) TABS tablet Take 1 tablet by mouth daily.      pravastatin (PRAVACHOL) 40 MG tablet Take 1 tablet (40 mg total) by mouth daily. 90 tablet 3   Semaglutide,0.25 or 0.5MG/DOS, (OZEMPIC, 0.25 OR 0.5 MG/DOSE,) 2 MG/1.5ML SOPN Inject 0.5 mg into the skin once a week. 4.5 mL 3   tamsulosin (FLOMAX) 0.4 MG CAPS capsule Take 1 capsule by mouth once  daily 30 capsule 0   temazepam (RESTORIL) 30 MG capsule Take 1 capsule by mouth at bedtime 30 capsule 0   VITAMIN D PO Take by mouth.     Accu-Chek Softclix Lancets lancets Use as instructed 200 each 3   albuterol (VENTOLIN HFA) 108 (90 Base) MCG/ACT inhaler Inhale 2 puffs into the lungs every 6 (six) hours as needed for wheezing or shortness of breath. 1 Inhaler 6   clobetasol ointment (TEMOVATE) 6.73 % 1 application to affected area     FLUZONE HIGH-DOSE QUADRIVALENT 0.7 ML SUSY      gabapentin (NEURONTIN) 100 MG capsule Take 2 capsules (200 mg total) by mouth at bedtime. 180 capsule 3   HYDROcodone-acetaminophen (NORCO) 10-325 MG tablet Take 1 tablet by mouth every 8 (eight) hours as needed for moderate pain or severe pain. 90 tablet 0   hydrocortisone 2.5 % ointment Apply topically 2 (two) times daily as needed. 30 g 1   Polyethyl Glycol-Propyl Glycol (SYSTANE OP) Place 1 drop into both eyes daily as  needed (dry eyes).     potassium chloride SA (KLOR-CON M20) 20 MEQ tablet Take 1 tablet (20 mEq total) by mouth as directed. 1 tablet daily 20 mEq by mouth as directed per [employer] research protocol. 90 tablet 3   tiZANidine (ZANAFLEX) 2 MG tablet Take 0.5-1 tablets (1-2 mg total) by mouth at bedtime as needed for muscle spasms. 3020 tablet 1   white petrolatum (VASELINE) GEL See admin instructions.     Current Facility-Administered Medications  Medication Dose Route Frequency Provider Last Rate Last Admin   0.9 %  sodium chloride infusion  500 mL Intravenous Once Nelida Meuse III, MD          ___________________________________________________________________ Objective   Exam:  BP 110/61   Pulse 79   Temp (!) 96.6 F (35.9 C) (Temporal)   Ht _0  (1.803 m)   Wt 188 lb (85.3 kg)   SpO2 97%   BMI 26.22 kg/m   CV: regular , S1/S2 Resp: clear to auscultation bilaterally, normal RR and effort noted GI: soft, no tenderness, with active bowel sounds.   Assessment: Encounter Diagnosis  Name Primary?   Personal history of colonic polyps Yes     Plan: Colonoscopy  The benefits and risks of the planned procedure were described in detail with the patient or (when appropriate) their health care proxy.  Risks were outlined as including, but not limited to, bleeding, infection, perforation, adverse medication reaction leading to cardiac or pulmonary decompensation, pancreatitis (if ERCP).  The limitation of incomplete mucosal visualization was also discussed.  No guarantees or warranties were given.    The patient is appropriate for an endoscopic procedure in the ambulatory setting.   - Wilfrid Lund, MD

## 2022-04-22 NOTE — Progress Notes (Signed)
Pt's states no medical or surgical changes since previsit or office visit. LAst dose of Eiiquis on 04/19/22. CBG was 79 and patient feeling weak started D5W at [facility] per protocol. Will recheck at 1010.

## 2022-04-22 NOTE — Progress Notes (Signed)
Report given to PACU, vss 

## 2022-04-22 NOTE — Patient Instructions (Signed)
Handout on diverticulosis given to patient. Resume previous diet and continue present medications - resume Eliquis at previous dose today 04/22/22 Repeat colonoscopy for surveillance in 5 years.  YOU HAD AN ENDOSCOPIC PROCEDURE TODAY AT [facility]:   Refer to the procedure report that was given to you for any specific questions about what was found during the examination.  If the procedure report does not answer your questions, please call your gastroenterologist to clarify.  If you requested that your care partner not be given the details of your procedure findings, then the procedure report has been included in a sealed envelope for you to review at your convenience later.  YOU SHOULD EXPECT: Some feelings of bloating in the abdomen. Passage of more gas than usual.  Walking can help get rid of the air that was put into your GI tract during the procedure and reduce the bloating. If you had a lower endoscopy (such as a colonoscopy or flexible sigmoidoscopy) you may notice spotting of blood in your stool or on the toilet paper. If you underwent a bowel prep for your procedure, you may not have a normal bowel movement for a few days.  Please Note:  You might notice some irritation and congestion in your nose or some drainage.  This is from the oxygen used during your procedure.  There is no need for concern and it should clear up in a day or so.  SYMPTOMS TO REPORT IMMEDIATELY:  Following lower endoscopy (colonoscopy or flexible sigmoidoscopy):  Excessive amounts of blood in the stool  Significant tenderness or worsening of abdominal pains  Swelling of the abdomen that is new, acute  Fever of 100F or higher  For urgent or emergent issues, a gastroenterologist can be reached at any hour by calling [phone]. Do not use MyChart messaging for urgent concerns.    DIET:  We do recommend a small meal at first, but then you may proceed to your regular diet.  Drink plenty of  fluids but you should avoid alcoholic beverages for 24 hours.  ACTIVITY:  You should plan to take it easy for the rest of today and you should NOT DRIVE or use heavy machinery until tomorrow (because of the sedation medicines used during the test).    FOLLOW UP: Our staff will call the number listed on your records the next business day following your procedure.  We will call around 7:15- 8:00 am to check on you and address any questions or concerns that you may have regarding the information given to you following your procedure. If we do not reach you, we will leave a message.     If any biopsies were taken you will be contacted by phone or by letter within the next 1-3 weeks.  Please call us at [phone] if you have not heard about the biopsies in 3 weeks.    SIGNATURES/CONFIDENTIALITY: You and/or your care partner have signed paperwork which will be entered into your electronic medical record.  These signatures attest to the fact that that the information above on your After Visit Summary has been reviewed and is understood.  Full responsibility of the confidentiality of this discharge information lies with you and/or your care-partner.

## 2022-04-23 ENCOUNTER — Telehealth: Payer: Self-pay | Admitting: *Deleted

## 2022-04-23 NOTE — Telephone Encounter (Signed)
No answer for post procedure call back. LEft VM. 

## 2022-05-04 ENCOUNTER — Ambulatory Visit: Payer: Medicare HMO | Admitting: Internal Medicine

## 2022-05-04 ENCOUNTER — Encounter: Payer: Self-pay | Admitting: Internal Medicine

## 2022-05-04 VITALS — BP 122/80 | HR 82 | Ht 71.0 in | Wt 185.6 lb

## 2022-05-04 DIAGNOSIS — E669 Obesity, unspecified: Secondary | ICD-10-CM

## 2022-05-04 DIAGNOSIS — E1169 Type 2 diabetes mellitus with other specified complication: Secondary | ICD-10-CM

## 2022-05-04 DIAGNOSIS — E663 Overweight: Secondary | ICD-10-CM | POA: Diagnosis not present

## 2022-05-04 DIAGNOSIS — E782 Mixed hyperlipidemia: Secondary | ICD-10-CM | POA: Diagnosis not present

## 2022-05-04 LAB — POCT GLYCOSYLATED HEMOGLOBIN (HGB A1C): Hemoglobin A1C: 5.8 % — AB (ref 4.0–5.6)

## 2022-05-04 NOTE — Patient Instructions (Signed)
Please continue: - Farxiga 10 mg daily - Ozempic 0.5 mg weekly  Please come back for a follow-up appointment in 6 months.

## 2022-05-04 NOTE — Progress Notes (Signed)
Patient ID: [name], male   DOB: 02-Jun-1951, 71 y.o.   MRN: [id number]  HPI: [name] is a 71 y.o.-year-old male, initially referred by his cardiologist, Dr. Sallyanne Kuster, returning for follow-up DM2, dx in 2011, non-insulin-dependent, uncontrolled, with complications (CHF, CKD, ED, DR).  Last visit 6 months ago.  Interim hx: No increased urination, blurry vision, nausea, chest pain. He is doing intermittent fasting - 18-6 -eating a window in the afternoon.  Reviewed HbA1c levels Lab Results  Component Value Date   HGBA1C 5.8 (A) 10/29/2021   HGBA1C 5.9 (A) 06/30/2021   HGBA1C 8.2 (H) 03/06/2021   HGBA1C 7.4 (A) 02/24/2021   HGBA1C 6.9 (A) 10/21/2020   HGBA1C 6.3 (A) 07/22/2020   HGBA1C 8.1 (A) 04/18/2020   HGBA1C 6.6 (A) 12/14/2019   HGBA1C 5.3 09/14/2019   HGBA1C 6.4 06/11/2019   HGBA1C 5.8 02/06/2019   HGBA1C 5.7 08/03/2018   HGBA1C 7.0 (H) 05/02/2018   HGBA1C 6.7 (H) 01/24/2018   HGBA1C 6.2 10/21/2017   HGBA1C 7.2 (H) 03/18/2017   HGBA1C 7.0 (H) 11/15/2016   HGBA1C 7.5 (H) 08/20/2016   HGBA1C 6.8 (H) 05/21/2016   HGBA1C 6.7 (H) 02/18/2016   HGBA1C 6.4 11/17/2015   HGBA1C 6.4 07/15/2015   HGBA1C 6.6 (H) 04/14/2015   HGBA1C 6.1 01/07/2015   HGBA1C 6.8 (H) 09/13/2014   HGBA1C 6.8 (H) 06/13/2014   HGBA1C 6.6 (H) 01/09/2014   HGBA1C 6.4 (H) 10/08/2013   HGBA1C 7.0 (H) 07/11/2013   HGBA1C 6.6 (H) 01/31/2013   Pt is on a regimen of: - Farxiga 10 mg daily-started by Dr. Haroldine Laws - Ozempic 0.25 >> 0.5 mg weekly - started 03/29/2021 - through [facility] Stopped Glyburide 02/2021.  He was not able to start Ozempic 0.5 mg- 04/2020 - due to price. He was on Metformin in the past.  Pt checks his sugars once a day: - am:144-171 >> 104-128, 134 >> 114-127, 141 >> 101-105 - 2h after b'fast: n/c  - before lunch: n/c >> 99-126 - 2h after lunch: n/c >> 165 >> n/c >> 224>> 93-124  >> n/c - before dinner: 81-133, 235 >> 178-227 >> n/c >> 91-113 - 2h after dinner: 109, 122,  144 >> n/c >> 107-161 >> 94 >> n/c - bedtime: 68-155, 176, 376  >> 154, 157, 200 >> n/c >> 105-119 - nighttime: n/c>> 128 >> n/c Lowest sugar was 60 >> .[name]. 104 >> 93 >> 91; he has hypoglycemia awareness in the 50s.  Highest sugar was 376 (cheese cake) >>...  227 >> 160s >> 142 >> 129.  Glucometer: One Touch verio IQ  Pt's meals are: - Breakfast: oatmeal + berries/apples + cinnamon, cereal, fruit - Lunch: light meal - Dinner:  + veggies, occasionally fried foods (1x a mo) - Snacks:juice >> fruit  -+ CKD-sees Dr. Juleen China, last BUN/creatinine:  Lab Results  Component Value Date   BUN 26 (H) 03/09/2022   BUN 38 (H) 11/03/2021   CREATININE 1.61 (H) 03/09/2022   CREATININE 1.94 (H) 11/03/2021   Lab Results  Component Value Date   GFRAA 42 (L) 05/20/2020   GFRAA 38 (L) 02/25/2020   GFRAA 36 (L) 11/26/2019   GFRAA 39 (L) 11/12/2019   GFRAA 36 (L) 09/18/2019   GFRAA 37 (L) 08/30/2019   GFRAA 39 (L) 08/14/2019   GFRAA 45 (L) 07/11/2019   GFRAA 51 (L) 05/29/2019   GFRAA 58 (L) 05/28/2019  He is not on ACE inhibitor/ARB.  -+ HL; last set of lipids: Lab Results  Component Value Date   CHOL 138 03/09/2022   HDL 39.80 03/09/2022   LDLCALC 81 03/09/2022   LDLDIRECT 84.0 05/02/2018   TRIG 86.0 03/09/2022   CHOLHDL 3 03/09/2022  On pravastatin 40, omega-3 fatty acids.  - last eye exam was 03/2022: + DR reportedly. Also glaucoma. Dr. Manuella Ghazi.  -+ numbness and tingling in his feet.Dr. Sallyanne Kuster started him on Gabapentin 100 mg daily.  Currently on 100-200 milligrams daily.  Last foot exam 01/15/2022 (Dr. Elisha Ponder)  Pt has FH of DM in mother, sister.  He is on amiodarone.  No FH of MTC. No personal hx of pancreatitis.  ROS: + see HPI  I reviewed pt's medications, allergies, PMH, social hx, family hx, and changes were documented in the history of present illness. Otherwise, unchanged from my initial visit note.  Past Medical History:  Diagnosis Date   AORTIC STENOSIS, MODERATE  12/05/2009   Arthritis    Atrial fibrillation (HCC)    Atrial fibrillation with RVR ([place]) 07/05/2017   Atrial flutter ([place]) 12/05/2009   Atypical chest pain 11/08/2011   BENIGN PROSTATIC HYPERTROPHY, HX OF 12/05/2009   CHEST PAIN-UNSPECIFIED 11/11/2009   CHF 12/05/2009   Chronic renal insufficiency 12/16/2016   Debility 04/18/2012   Diarrhea 12/16/2016   DYSPNEA 12/19/2009   Edema 04/18/2012   ERECTILE DYSFUNCTION 01/25/2007   FATIGUE, CHRONIC 11/11/2009   Gout 07/10/2012   Heart murmur    History of kidney stones    "they passed" (07/05/2017)   Hyperkalemia 04/18/2012   Hyperlipidemia    Hypertension    HYPERTENSION 01/25/2007   Hypoxia 05/05/2011   Insomnia 05/13/2016   INSOMNIA, HX OF 01/25/2007   Knee pain, bilateral 12/28/2010   Knee pain, right 12/28/2010   NEUROMA 01/05/2010   OBESITY NOS 01/25/2007   OSA on CPAP 04/06/2010   PERIPHERAL NEUROPATHY 01/05/2010   PULMONARY FUNCTION TESTS, ABNORMAL 02/02/2010   Sleep apnea    wears CPAP   Superficial thrombophlebitis of left leg 09/07/2011   TESTICULAR HYPOFUNCTION 01/05/2010   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   Toe pain, right 07/10/2012   Type II diabetes mellitus ([place]) 12/05/2009   UNSPECIFIED ANEMIA 01/05/2010   Past Surgical History:  Procedure Laterality Date   A-FLUTTER ABLATION N/A 05/16/2019   Procedure: A-FLUTTER ABLATION;  Surgeon: Constance Haw, MD;  Location: [facility];  Service: Cardiovascular;  Laterality: N/A;   ATRIAL FIBRILLATION ABLATION N/A 05/16/2019   Procedure: ATRIAL FIBRILLATION ABLATION;  Surgeon: Constance Haw, MD;  Location: [facility];  Service: Cardiovascular;  Laterality: N/A;   ATRIAL FIBRILLATION ABLATION N/A 03/10/2020   Procedure: ATRIAL FIBRILLATION ABLATION;  Surgeon: Constance Haw, MD;  Location: [facility];  Service: Cardiovascular;  Laterality: N/A;   CARDIAC CATHETERIZATION  10/16/2009   nonischemic cardiomyopathy   CARDIOVERSION  N/A 03/25/2015   Procedure: CARDIOVERSION;  Surgeon: Pixie Casino, MD;  Location: [facility];  Service: Cardiovascular;  Laterality: N/A;   CARDIOVERSION N/A 06/17/2017   Procedure: CARDIOVERSION;  Surgeon: Sanda Klein, MD;  Location: [facility];  Service: Cardiovascular;  Laterality: N/A;   CARDIOVERSION N/A 07/08/2017   Procedure: CARDIOVERSION;  Surgeon: Fay Records, MD;  Location: [facility];  Service: Cardiovascular;  Laterality: N/A;   CARDIOVERSION N/A 02/27/2019   Procedure: CARDIOVERSION;  Surgeon: Sanda Klein, MD;  Location: [facility];  Service: Cardiovascular;  Laterality: N/A;   CARDIOVERSION N/A 08/16/2019   Procedure: CARDIOVERSION;  Surgeon: Sanda Klein, MD;  Location: [facility];  Service: Cardiovascular;  Laterality: N/A;   COLONOSCOPY  03/10/2021   1st colonoscopy , Wilfrid Lund, [facility], large transverse polyp, clip and spot applied   METATARSAL OSTEOTOMY Bilateral ~ 1980   removed part of 5th metatarsal to corect curvature of toes    RIGHT HEART CATH N/A 05/16/2019   Procedure: RIGHT HEART CATH;  Surgeon: Jolaine Artist, MD;  Location: [facility];  Service: Cardiovascular;  Laterality: N/A;   TEE WITHOUT CARDIOVERSION  05/16/2019   Procedure: Transesophageal Echocardiogram (Tee);  Surgeon: Constance Haw, MD;  Location: [facility];  Service: Cardiovascular;;   TEE WITHOUT CARDIOVERSION N/A 03/07/2020   Procedure: TRANSESOPHAGEAL ECHOCARDIOGRAM (TEE);  Surgeon: Freada Bergeron, MD;  Location: [facility];  Service: Cardiovascular;  Laterality: N/A;   Social History   Socioeconomic History   Marital status: Married    Spouse name: Not on file   Number of children: 4   Years of education: 12   Highest education level: High school graduate  Occupational History   Occupation: retired    Fish farm manager: [employer]  Tobacco Use   Smoking status: Former    Packs/day: 1.00    Years: 10.00    Total pack years: 10.00     Types: Cigarettes    Quit date: 06/07/1980    Years since quitting: 41.9   Smokeless tobacco: Never  Vaping Use   Vaping Use: Never used  Substance and Sexual Activity   Alcohol use: No   Drug use: No   Sexual activity: Not Currently  Other Topics Concern   Not on file  Social History Narrative   Lives with male partner in a one story home.  His son lives there off and on.  Retired from [employer].  Education: high school.  Right handed   Social Determinants of Health   Financial Resource Strain: Not on file  Food Insecurity: Not on file  Transportation Needs: Not on file  Physical Activity: Not on file  Stress: Stress Concern Present (04/05/2022)   [place]    Feeling of Stress : To some extent  Social Connections: Not on file  Intimate Partner Violence: Not on file   Current Outpatient Medications on File Prior to Visit  Medication Sig Dispense Refill   Accu-Chek Softclix Lancets lancets Use as instructed 200 each 3   albuterol (VENTOLIN HFA) 108 (90 Base) MCG/ACT inhaler Inhale 2 puffs into the lungs every 6 (six) hours as needed for wheezing or shortness of breath. 1 Inhaler 6   Alcohol Swabs (DROPSAFE ALCOHOL PREP) 70 % PADS USE AS DIRECTED 300 each 1   allopurinol (ZYLOPRIM) 300 MG tablet Take 1 tablet by mouth once daily 90 tablet 1   apixaban (ELIQUIS) 5 MG TABS tablet Take 1 tablet by mouth twice daily 60 tablet 5   Blood Glucose Monitoring Suppl (ACCU-CHEK GUIDE ME) w/Device KIT USE TO TEST BLOOD SUGAR ONCE TO TWICE DAILY 1 kit 0   calcium carbonate (TUMS - DOSED IN MG ELEMENTAL CALCIUM) 500 MG chewable tablet Chew 500 mg by mouth daily as needed for indigestion or heartburn.     cetirizine (ZYRTEC) 10 MG tablet Take 1 tablet (10 mg total) by mouth daily. 30 tablet 11   clobetasol ointment (TEMOVATE) 6.64 % 1 application to affected area     Clobetasol Prop Emollient Base (CLOBETASOL PROPIONATE E) 0.05  % emollient cream Apply 1 application topically 2 (two) times daily as needed (insect bite). [place]  g 1   colchicine (COLCRYS) 0.6 MG tablet Take one tablet for 4-6 days as needed for gout flare up. 15 tablet 3   dapagliflozin propanediol (FARXIGA) 10 MG TABS tablet TAKE 1 TABLET BY MOUTH ONCE DAILY 90 tablet 3   fish oil-omega-3 fatty acids 1000 MG capsule Take 1 g by mouth daily.     FLUZONE HIGH-DOSE QUADRIVALENT 0.7 ML SUSY      furosemide (LASIX) 80 MG tablet Take 1 tablet (80 mg total) by mouth daily. As needed patient may take an additional 40 MG tablet as direct per [employer] study PRN plan. 90 tablet 3   gabapentin (NEURONTIN) 100 MG capsule Take 2 capsules (200 mg total) by mouth at bedtime. 180 capsule 3   glucose blood (ACCU-CHEK GUIDE) test strip USE TO TEST BLOOD SUGAR ONCE TO TWICE DAILY 100 each 0   hydrALAZINE (APRESOLINE) 25 MG tablet TAKE 1 TABLET BY MOUTH THREE TIMES DAILY 270 tablet 0   HYDROcodone-acetaminophen (NORCO) 10-325 MG tablet Take 1 tablet by mouth every 8 (eight) hours as needed for moderate pain or severe pain. 90 tablet 0   hydrocortisone 2.5 % ointment Apply topically 2 (two) times daily as needed. 30 g 1   isosorbide mononitrate (IMDUR) 30 MG 24 hr tablet Take 1 tablet by mouth once daily 90 tablet 0   LORazepam (ATIVAN) 1 MG tablet TAKE 1 TABLET BY MOUTH EVERY 8 HOURS AS NEEDED FOR ANXIETY 30 tablet 0   magnesium oxide (MAG-OX) 400 MG tablet Take 1 tablet (400 mg total) by mouth 2 (two) times daily. 30 tablet 11   metoprolol succinate (TOPROL-XL) 50 MG 24 hr tablet Take 1 tablet (50 mg total) by mouth in the morning and at bedtime. WITH OR FOLLOWING A MEAL 180 tablet 3   Multiple Vitamin (MULTIVITAMIN WITH MINERALS) TABS tablet Take 1 tablet by mouth daily.      Polyethyl Glycol-Propyl Glycol (SYSTANE OP) Place 1 drop into both eyes daily as needed (dry eyes).     potassium chloride SA (KLOR-CON M20) 20 MEQ tablet Take 1 tablet (20 mEq total) by mouth as  directed. 1 tablet daily 20 mEq by mouth as directed per alleviate research protocol. 90 tablet 3   pravastatin (PRAVACHOL) 40 MG tablet Take 1 tablet (40 mg total) by mouth daily. 90 tablet 3   Semaglutide,0.25 or 0.5MG/DOS, (OZEMPIC, 0.25 OR 0.5 MG/DOSE,) 2 MG/1.5ML SOPN Inject 0.5 mg into the skin once a week. 4.5 mL 3   tamsulosin (FLOMAX) 0.4 MG CAPS capsule Take 1 capsule by mouth once daily 30 capsule 0   temazepam (RESTORIL) 30 MG capsule Take 1 capsule by mouth at bedtime 30 capsule 0   tiZANidine (ZANAFLEX) 2 MG tablet Take 0.5-1 tablets (1-2 mg total) by mouth at bedtime as needed for muscle spasms. 3020 tablet 1   VITAMIN D PO Take by mouth.     white petrolatum (VASELINE) GEL See admin instructions.     No current facility-administered medications on file prior to visit.   Allergies  Allergen Reactions   Sulfonamide Derivatives Other (See Comments)    Flu like symptoms    Zolpidem Other (See Comments)    Excessive, prolonged sedation   Family History  Problem Relation Age of Onset   Clotting disorder Mother    Heart disease Mother        s/p MI   Heart attack Mother    Hypertension Mother    Diabetes Mother    Hyperlipidemia Mother  Stroke Mother    Cancer Father        ? lung   Lung disease Father        smoker   Diabetes Sister    Hypertension Sister        smoker   Aneurysm Sister        brain   Other Sister        clipped   Seizures Sister        d/o w/aneurysm/ smoker   Leukemia Maternal Grandmother        ?   Colon cancer Neg Hx    Colon polyps Neg Hx    Stomach cancer Neg Hx    Rectal cancer Neg Hx    Esophageal cancer Neg Hx    PE: BP 122/80 (BP Location: Left Arm, Patient Position: Sitting, Cuff Size: Normal)   Ht _0  (1.803 m)   Wt 185 lb 9.6 oz (84.2 kg)   BMI 25.89 kg/m  Wt Readings from Last 3 Encounters:  05/04/22 185 lb 9.6 oz (84.2 kg)  04/22/22 188 lb (85.3 kg)  04/14/22 186 lb 6.4 oz (84.6 kg)   Constitutional:  overweight, in NAD Eyes:  EOMI, no exophthalmos ENT: no neck masses, no cervical lymphadenopathy Cardiovascular: RRR, No MRG Respiratory: CTA B Musculoskeletal: no deformities Skin:no rashes Neurological: no tremor with outstretched hands  ASSESSMENT: 1. DM2, non-insulin-dependent, uncontrolled, with long-term complications - s and d CHF - A fib - s/p cardioversion - 05/2019 >> went into cardiac shock - CKD - sees nephrology - DR - ED  2.  Overweight  3. HL  4.  Peripheral neuropathy  PLAN:  1. Patient with longstanding, previously uncontrolled type 2 diabetes, with significant improvement in control on a regimen of SGLT2 inhibitor and weekly GLP-1 receptor agonist.  We were able to stop his sulfonylurea when we added the GLP-1 receptor agonist.  He obtains those [place] and Ozempic through the patient assistance programs.  He continues to stay active and continues on a mostly whole food plant-based diet, with intermittent fasting.  We did not change the regimen at last visit as his sugars were at goal. -At today's, all his blood sugars are at goal.  We can continue the current regimen.  He also continues intermittent fasting.  Today I gave him a reference for this ("The Obesity Code") as he mentions that he enjoyed the previous references that I suggested about a plant-based diet. - I suggested to:  Patient Instructions  Please continue: - Farxiga 10 mg daily - Ozempic 0.5 mg weekly  Please come back for a follow-up appointment in 6 months.  - we checked his HbA1c: 5.8% (stable, at goal) - advised to check sugars at different times of the day - 1x a day, rotating check times - advised for yearly eye exams >> he is UTD - return to clinic in 6 months  2.  Overweight -continue SGLT 2 inhibitor and GLP-1 receptor agonist which should also help with weight loss -Before last visit he lost 9 pounds-intermittent fasting - gained 1 lb since last OV  3. HL -Reviewed latest lipid  panel from 03/2022: Fractions at goal with the exception of an LDL higher than our goal of less than 55 due to history of cardiovascular disease: Lab Results  Component Value Date   CHOL 138 03/09/2022   HDL 39.80 03/09/2022   LDLCALC 81 03/09/2022   LDLDIRECT 84.0 05/02/2018   TRIG 86.0 03/09/2022   CHOLHDL 3 03/09/2022  -  He continues on pravastatin 40 mg daily and omega-3 fatty acids without signs  4.  Peripheral neuropathy -Most likely related to his diabetes -On gabapentin 100 to 200 mg at bedtime, initially added by Dr. Sallyanne Kuster -He continues to have numbness and tingling -I sent a prescription for diabetic shoes earlier in the year  Kevin Kingdom, MD PhD [facility] Endocrinology

## 2022-05-05 DIAGNOSIS — R972 Elevated prostate specific antigen [PSA]: Secondary | ICD-10-CM | POA: Diagnosis not present

## 2022-05-05 DIAGNOSIS — E291 Testicular hypofunction: Secondary | ICD-10-CM | POA: Diagnosis not present

## 2022-05-07 ENCOUNTER — Ambulatory Visit (INDEPENDENT_AMBULATORY_CARE_PROVIDER_SITE_OTHER): Payer: Medicare HMO | Admitting: Podiatry

## 2022-05-07 DIAGNOSIS — E1142 Type 2 diabetes mellitus with diabetic polyneuropathy: Secondary | ICD-10-CM

## 2022-05-07 DIAGNOSIS — M79674 Pain in right toe(s): Secondary | ICD-10-CM | POA: Diagnosis not present

## 2022-05-07 DIAGNOSIS — M79675 Pain in left toe(s): Secondary | ICD-10-CM | POA: Diagnosis not present

## 2022-05-07 DIAGNOSIS — B351 Tinea unguium: Secondary | ICD-10-CM | POA: Diagnosis not present

## 2022-05-07 NOTE — Progress Notes (Unsigned)
  Subjective:  Patient ID: [name], male    DOB: 08/27/1950,  MRN: [id number]  [name] presents to clinic today for at risk foot care with history of diabetic neuropathy and painful thick toenails that are difficult to trim. Pain interferes with ambulation. Aggravating factors include wearing enclosed shoe gear. Pain is relieved with periodic professional debridement. No chief complaint on file.  New problem(s): None.   PCP is [name], MD.  Allergies  Allergen Reactions   Sulfonamide Derivatives Other (See Comments)    Flu like symptoms    Zolpidem Other (See Comments)    Excessive, prolonged sedation    Review of Systems: Negative except as noted in the HPI.  Objective: No changes noted in today's physical examination.  [name] is a pleasant 71 y.o. male WD, WN in NAD. AAO x 3.  Vascular CFT immediate b/l LE. Palpable DP/PT pulses b/l LE. Digital hair sparse b/l. Skin temperature gradient WNL b/l. No pain with calf compression b/l. No edema noted b/l. No cyanosis or clubbing noted b/l LE.  Neurologic Pt has subjective symptoms of neuropathy. Protective sensation intact 5/5 intact bilaterally with 10g monofilament b/l. Vibratory sensation intact b/l.  Dermatologic Pedal integument with normal turgor, texture and tone b/l LE. No open wounds b/l. No interdigital macerations b/l. Toenails 1-5 b/l elongated, thickened, discolored with subungual debris. +Tenderness with dorsal palpation of nailplates. No hyperkeratotic nor porokeratotic lesions present on today's visit.  Orthopedic: Muscle strength 5/5 to all lower extremity muscle groups bilaterally. No pain, crepitus or joint limitation noted with ROM bilateral LE. Hammertoe deformity noted 2-5 b/l.    Assessment/Plan: 1. Pain due to onychomycosis of toenails of both feet   2. Diabetic peripheral neuropathy associated with type 2 diabetes mellitus (HCC)     No orders of the defined types were placed in  this encounter.   -Consent given for treatment as described below: -Examined patient. -Continue supportive shoe gear daily. -Toenails 1-5 b/l were debrided in length and girth with sterile nail nippers and dremel without iatrogenic bleeding.  -Patient/POA to call should there be question/concern in the interim.   Return in about 3 months (around 08/06/2022).  Marzetta Board, DPM

## 2022-05-08 ENCOUNTER — Encounter: Payer: Self-pay | Admitting: Podiatry

## 2022-05-09 ENCOUNTER — Other Ambulatory Visit: Payer: Self-pay | Admitting: Family Medicine

## 2022-05-10 ENCOUNTER — Telehealth: Payer: Self-pay | Admitting: Cardiovascular Disease

## 2022-05-10 NOTE — Telephone Encounter (Signed)
Spoke with patient as his patient assistance on his Eliquis is ran out last month and his Wilder Glade ran out in September. He has 2 days left and is calling for samples to help until his patient assistance is processed going into 2024. Copies of his 2022-2023 patient assistance are in his chart as a reference.  Patient provider 21 doses of Farxiga left at front desk. Patient notified via [place].

## 2022-05-10 NOTE — Telephone Encounter (Signed)
   Pt c/o medication issue:  1. Name of Medication:   apixaban (ELIQUIS) 5 MG TABS tablet  dapagliflozin propanediol (FARXIGA) 10 MG TABS tablet   2. How are you currently taking this medication (dosage and times per day)? As directed  3. Are you having a reaction (difficulty breathing--STAT)?  NA  4. What is your medication issue?  Patient is calling stating that he thinks his patient assistance for his medications has run out and he needs to see if that can be redone. Patient states he only has 2 days left of the [place] and would like to know if we could give him some samples to cover him until the assistance comes through.

## 2022-05-11 ENCOUNTER — Telehealth: Payer: Self-pay | Admitting: Licensed Clinical Social Worker

## 2022-05-12 DIAGNOSIS — N401 Enlarged prostate with lower urinary tract symptoms: Secondary | ICD-10-CM | POA: Diagnosis not present

## 2022-05-12 DIAGNOSIS — R3915 Urgency of urination: Secondary | ICD-10-CM | POA: Diagnosis not present

## 2022-05-12 DIAGNOSIS — E291 Testicular hypofunction: Secondary | ICD-10-CM | POA: Diagnosis not present

## 2022-05-12 DIAGNOSIS — R972 Elevated prostate specific antigen [PSA]: Secondary | ICD-10-CM | POA: Diagnosis not present

## 2022-05-12 NOTE — Telephone Encounter (Signed)
H&V Care Navigation CSW Progress Note  Clinical Social Worker contacted patient by phone to f/u on request from Maudie Mercury, [place], to reach out to pt regarding medication affordability. Pt was reached at [phone] - introduced self, role, reason for call. Pt shares that he received a letter regarding his Extra Help eligibility. Letter states that he was told that he had too many assets, but he thinks this may be related to savings he was accruing to pay property taxes- this is now paid and he does not have any savings. Pt located assistance numbers on letter regarding Extra Help. He will call them and f/u with me if any issues, he has 10 days to appeal this decision. In the mean time he will come and get samples and assistance applications from our office on Wednesday and he understands he likely will need to provide proof of income to submit with application. Pt also provided with SHIIP line at [employer] should he wish to speak with someone about any issues regarding Medicare or enrollment changes which is open through 05/13/22. Pt took down my name and number, I mailed him [facility] flyer and my card. I remain available if additional challenges occur.   Patient is participating in a Managed Medicaid Plan:  No [employer] Medicare   SDOH Screenings   Depression (PHQ2-9): Low Risk  (03/09/2022)  Stress: Stress Concern Present (04/05/2022)  Tobacco Use: Medium Risk (05/08/2022)    Westley Hummer, MSW, [place]  [phone]- work cell phone (preferred) [phone]- desk phone

## 2022-06-03 ENCOUNTER — Other Ambulatory Visit: Payer: Self-pay | Admitting: Internal Medicine

## 2022-06-03 ENCOUNTER — Other Ambulatory Visit: Payer: Self-pay | Admitting: Family Medicine

## 2022-06-03 NOTE — Telephone Encounter (Signed)
Requesting: lorazepam '1mg'$   Contract:07/07/21 UDS:07/07/21 Last Visit: 03/09/22 Next Visit: 09/21/22 Last Refill: 03/22/22 #30 and 0RF   Please Advise

## 2022-06-04 ENCOUNTER — Telehealth: Payer: Self-pay | Admitting: Cardiovascular Disease

## 2022-06-04 ENCOUNTER — Other Ambulatory Visit (HOSPITAL_COMMUNITY): Payer: Self-pay | Admitting: Internal Medicine

## 2022-06-04 MED ORDER — DAPAGLIFLOZIN PROPANEDIOL 10 MG PO TABS: 10.0000 mg | ORAL_TABLET | Freq: Every day | ORAL | 3 refills | Status: DC

## 2022-06-04 NOTE — Telephone Encounter (Signed)
*  STAT* If patient is at the pharmacy, call can be transferred to refill team.   1. Which medications need to be refilled? (please list name of each medication and dose if known) dapagliflozin propanediol (FARXIGA) 10 MG TABS tablet   2. Which pharmacy/location (including street and city if local pharmacy) is medication to be sent to?  [place], [place]    3. Do they need a 30 day or 90 day supply? 90 day  Patient only has two day left.

## 2022-06-04 NOTE — Telephone Encounter (Signed)
Pt is getting Wilder Glade through patient assistance with heart and vascular office.

## 2022-06-09 VITALS — BP 109/63 | HR 77 | Resp 14 | Ht 71.0 in | Wt 184.0 lb

## 2022-06-09 DIAGNOSIS — Z006 Encounter for examination for normal comparison and control in clinical research program: Secondary | ICD-10-CM

## 2022-06-09 NOTE — Research (Signed)
Entered in error

## 2022-06-09 NOTE — Research (Signed)
Alleviate Study Exit  Patient has completed the study at this time.  No adverse events or medication changes to report at this time.   Labs drawn 6 MHWT done NYHA l EQ 5D 5L done KCCQ done   Current Outpatient Medications:    Accu-Chek Softclix Lancets lancets, Use as instructed, Disp: 200 each, Rfl: 3   albuterol (VENTOLIN HFA) 108 (90 Base) MCG/ACT inhaler, Inhale 2 puffs into the lungs every 6 (six) hours as needed for wheezing or shortness of breath., Disp: 1 Inhaler, Rfl: 6   Alcohol Swabs (DROPSAFE ALCOHOL PREP) 70 % PADS, USE AS DIRECTED, Disp: 300 each, Rfl: 1   allopurinol (ZYLOPRIM) 300 MG tablet, Take 1 tablet by mouth once daily, Disp: 90 tablet, Rfl: 1   apixaban (ELIQUIS) 5 MG TABS tablet, Take 1 tablet by mouth twice daily, Disp: 60 tablet, Rfl: 5   Blood Glucose Monitoring Suppl (ACCU-CHEK GUIDE ME) w/Device KIT, USE TO TEST BLOOD SUGAR ONCE TO TWICE DAILY, Disp: 1 kit, Rfl: 0   calcium carbonate (TUMS - DOSED IN MG ELEMENTAL CALCIUM) 500 MG chewable tablet, Chew 500 mg by mouth daily as needed for indigestion or heartburn., Disp: , Rfl:    cetirizine (ZYRTEC) 10 MG tablet, Take 1 tablet (10 mg total) by mouth daily., Disp: 30 tablet, Rfl: 11   clobetasol ointment (TEMOVATE) 4.09 %, 1 application to affected area, Disp: , Rfl:    Clobetasol Prop Emollient Base (CLOBETASOL PROPIONATE E) 0.05 % emollient cream, Apply 1 application topically 2 (two) times daily as needed (insect bite)., Disp: 60 g, Rfl: 1   colchicine (COLCRYS) 0.6 MG tablet, Take one tablet for 4-6 days as needed for gout flare up., Disp: 15 tablet, Rfl: 3   dapagliflozin propanediol (FARXIGA) 10 MG TABS tablet, Take 1 tablet (10 mg total) by mouth daily., Disp: 30 tablet, Rfl: 3   fish oil-omega-3 fatty acids 1000 MG capsule, Take 1 g by mouth daily., Disp: , Rfl:    FLUZONE HIGH-DOSE QUADRIVALENT 0.7 ML SUSY, , Disp: , Rfl:    furosemide (LASIX) 80 MG tablet, Take 1 tablet (80 mg total) by mouth daily. As  needed patient may take an additional 40 MG tablet as direct per Alleviate Research study PRN plan., Disp: 90 tablet, Rfl: 3   gabapentin (NEURONTIN) 100 MG capsule, Take 2 capsules (200 mg total) by mouth at bedtime., Disp: 180 capsule, Rfl: 3   glucose blood (ACCU-CHEK GUIDE) test strip, USE TO TEST BLOOD SUGAR ONCE TO TWICE DAILY, Disp: 100 each, Rfl: 0   hydrALAZINE (APRESOLINE) 25 MG tablet, TAKE 1 TABLET BY MOUTH THREE TIMES DAILY, Disp: 270 tablet, Rfl: 0   HYDROcodone-acetaminophen (NORCO) 10-325 MG tablet, Take 1 tablet by mouth every 8 (eight) hours as needed for moderate pain or severe pain., Disp: 90 tablet, Rfl: 0   hydrocortisone 2.5 % ointment, Apply topically 2 (two) times daily as needed., Disp: 30 g, Rfl: 1   isosorbide mononitrate (IMDUR) 30 MG 24 hr tablet, Take 1 tablet by mouth once daily, Disp: 90 tablet, Rfl: 0   LORazepam (ATIVAN) 1 MG tablet, TAKE 1 TABLET BY MOUTH EVERY 8 HOURS AS NEEDED FOR ANXIETY, Disp: 30 tablet, Rfl: 0   magnesium oxide (MAG-OX) 400 MG tablet, Take 1 tablet (400 mg total) by mouth 2 (two) times daily., Disp: 30 tablet, Rfl: 11   metoprolol succinate (TOPROL-XL) 50 MG 24 hr tablet, Take 1 tablet (50 mg total) by mouth in the morning and at bedtime. WITH OR  FOLLOWING A MEAL, Disp: 180 tablet, Rfl: 3   Multiple Vitamin (MULTIVITAMIN WITH MINERALS) TABS tablet, Take 1 tablet by mouth daily. , Disp: , Rfl:    Polyethyl Glycol-Propyl Glycol (SYSTANE OP), Place 1 drop into both eyes daily as needed (dry eyes)., Disp: , Rfl:    pravastatin (PRAVACHOL) 40 MG tablet, Take 1 tablet (40 mg total) by mouth daily., Disp: 90 tablet, Rfl: 3   Semaglutide,0.25 or 0.5MG/DOS, (OZEMPIC, 0.25 OR 0.5 MG/DOSE,) 2 MG/1.5ML SOPN, Inject 0.5 mg into the skin once a week., Disp: 4.5 mL, Rfl: 3   tamsulosin (FLOMAX) 0.4 MG CAPS capsule, Take 1 capsule by mouth once daily, Disp: 30 capsule, Rfl: 0   temazepam (RESTORIL) 30 MG capsule, Take 1 capsule by mouth at bedtime, Disp: 30  capsule, Rfl: 2   tiZANidine (ZANAFLEX) 2 MG tablet, Take 0.5-1 tablets (1-2 mg total) by mouth at bedtime as needed for muscle spasms., Disp: 3020 tablet, Rfl: 1   VITAMIN D PO, Take by mouth., Disp: , Rfl:    white petrolatum (VASELINE) GEL, See admin instructions., Disp: , Rfl:    potassium chloride SA (KLOR-CON M20) 20 MEQ tablet, Take 1 tablet (20 mEq total) by mouth as directed. 1 tablet daily 20 mEq by mouth as directed per alleviate research protocol., Disp: 90 tablet, Rfl: 3

## 2022-06-11 ENCOUNTER — Telehealth: Payer: Self-pay

## 2022-06-11 LAB — CBC
Hematocrit: 45.4 % (ref 37.5–51.0)
Hemoglobin: 14.3 g/dL (ref 13.0–17.7)
MCH: 26 pg — ABNORMAL LOW (ref 26.6–33.0)
MCHC: 31.5 g/dL (ref 31.5–35.7)
MCV: 82 fL (ref 79–97)
Platelets: 196 10*3/uL (ref 150–450)
RBC: 5.51 x10E6/uL (ref 4.14–5.80)
RDW: 14.4 % (ref 11.6–15.4)
WBC: 3.4 10*3/uL (ref 3.4–10.8)

## 2022-06-11 LAB — BASIC METABOLIC PANEL
BUN/Creatinine Ratio: 17 (ref 10–24)
BUN: 27 mg/dL (ref 8–27)
CO2: 27 mmol/L (ref 20–29)
Calcium: 9.5 mg/dL (ref 8.6–10.2)
Chloride: 100 mmol/L (ref 96–106)
Creatinine, Ser: 1.59 mg/dL — ABNORMAL HIGH (ref 0.76–1.27)
Glucose: 114 mg/dL — ABNORMAL HIGH (ref 70–99)
Potassium: 3.9 mmol/L (ref 3.5–5.2)
Sodium: 141 mmol/L (ref 134–144)
eGFR: 47 mL/min/{1.73_m2} — ABNORMAL LOW (ref >59)

## 2022-06-11 LAB — BRAIN NATRIURETIC PEPTIDE: BNP: 145.6 pg/mL — ABNORMAL HIGH (ref 0.0–100.0)

## 2022-06-11 NOTE — Telephone Encounter (Signed)
Pt advised patient assistance Ozempic 5 boxes have been delivered and ready for pick up.

## 2022-06-14 ENCOUNTER — Other Ambulatory Visit: Payer: Self-pay | Admitting: Cardiology

## 2022-06-14 DIAGNOSIS — I480 Paroxysmal atrial fibrillation: Secondary | ICD-10-CM

## 2022-06-14 MED ORDER — APIXABAN 5 MG PO TABS: 5.0000 mg | ORAL_TABLET | Freq: Two times a day (BID) | ORAL | 5 refills | Status: DC

## 2022-06-14 NOTE — Telephone Encounter (Signed)
Pharmacy requesting refill on Eliquis

## 2022-06-14 NOTE — Telephone Encounter (Signed)
Prescription refill request for Eliquis received. Indication:afib Last office visit:11/23 Scr:1.5 Age: 72 Weight:83.5  kg  Prescription refilled

## 2022-06-17 NOTE — Telephone Encounter (Signed)
Patient picked up 5 boxes (patient assistance) Ozempic in office today.

## 2022-07-02 ENCOUNTER — Ambulatory Visit (HOSPITAL_COMMUNITY): Payer: Medicare HMO | Attending: Cardiovascular Disease

## 2022-07-02 DIAGNOSIS — I351 Nonrheumatic aortic (valve) insufficiency: Secondary | ICD-10-CM | POA: Diagnosis not present

## 2022-07-02 LAB — ECHOCARDIOGRAM COMPLETE
Area-P 1/2: 5.23 cm2
MV M vel: 3.71 m/s
MV Peak grad: 54.9 mmHg
Radius: 0.6 cm
S' Lateral: 3.8 cm

## 2022-07-11 ENCOUNTER — Other Ambulatory Visit: Payer: Self-pay | Admitting: Family Medicine

## 2022-07-11 DIAGNOSIS — G4700 Insomnia, unspecified: Secondary | ICD-10-CM

## 2022-07-11 DIAGNOSIS — F411 Generalized anxiety disorder: Secondary | ICD-10-CM

## 2022-07-11 DIAGNOSIS — E875 Hyperkalemia: Secondary | ICD-10-CM

## 2022-07-11 DIAGNOSIS — Z7901 Long term (current) use of anticoagulants: Secondary | ICD-10-CM

## 2022-07-11 DIAGNOSIS — E785 Hyperlipidemia, unspecified: Secondary | ICD-10-CM

## 2022-07-11 DIAGNOSIS — D649 Anemia, unspecified: Secondary | ICD-10-CM

## 2022-07-11 DIAGNOSIS — E1169 Type 2 diabetes mellitus with other specified complication: Secondary | ICD-10-CM

## 2022-07-11 DIAGNOSIS — M25561 Pain in right knee: Secondary | ICD-10-CM

## 2022-07-11 DIAGNOSIS — H409 Unspecified glaucoma: Secondary | ICD-10-CM

## 2022-07-11 DIAGNOSIS — I10 Essential (primary) hypertension: Secondary | ICD-10-CM

## 2022-07-16 ENCOUNTER — Ambulatory Visit: Payer: Medicare HMO | Attending: Cardiovascular Disease | Admitting: Cardiovascular Disease

## 2022-07-16 ENCOUNTER — Encounter: Payer: Self-pay | Admitting: Cardiovascular Disease

## 2022-07-16 VITALS — BP 124/72 | HR 88 | Ht 71.0 in | Wt 195.0 lb

## 2022-07-16 DIAGNOSIS — I2721 Secondary pulmonary arterial hypertension: Secondary | ICD-10-CM | POA: Diagnosis not present

## 2022-07-16 DIAGNOSIS — E1142 Type 2 diabetes mellitus with diabetic polyneuropathy: Secondary | ICD-10-CM

## 2022-07-16 DIAGNOSIS — I10 Essential (primary) hypertension: Secondary | ICD-10-CM

## 2022-07-16 DIAGNOSIS — N1832 Chronic kidney disease, stage 3b: Secondary | ICD-10-CM

## 2022-07-16 DIAGNOSIS — I5042 Chronic combined systolic (congestive) and diastolic (congestive) heart failure: Secondary | ICD-10-CM

## 2022-07-16 DIAGNOSIS — D6869 Other thrombophilia: Secondary | ICD-10-CM

## 2022-07-16 DIAGNOSIS — E785 Hyperlipidemia, unspecified: Secondary | ICD-10-CM | POA: Diagnosis not present

## 2022-07-16 DIAGNOSIS — G4733 Obstructive sleep apnea (adult) (pediatric): Secondary | ICD-10-CM | POA: Diagnosis not present

## 2022-07-16 DIAGNOSIS — I480 Paroxysmal atrial fibrillation: Secondary | ICD-10-CM

## 2022-07-16 DIAGNOSIS — I351 Nonrheumatic aortic (valve) insufficiency: Secondary | ICD-10-CM

## 2022-07-16 DIAGNOSIS — Z95818 Presence of other cardiac implants and grafts: Secondary | ICD-10-CM

## 2022-07-16 NOTE — Patient Instructions (Signed)
Medication Instructions:  Your physician recommends that you continue on your current medications as directed. Please refer to the Current Medication list given to you today.  *If you need a refill on your cardiac medications before your next appointment, please call your pharmacy*   Testing/Procedures: Your physician has requested that you have an echocardiogram. Echocardiography is a painless test that uses sound waves to create images of your heart. It provides your doctor with information about the size and shape of your heart and how well your heart's chambers and valves are working. This procedure takes approximately one hour. There are no restrictions for this procedure. Please do NOT wear cologne, perfume, aftershave, or lotions (deodorant is allowed). Please arrive 15 minutes prior to your appointment time. DUE in 1 year - February 2025   Follow-Up: At [facility], you and your health needs are our priority.  As part of our continuing mission to provide you with exceptional heart care, we have created designated Provider Care Teams.  These Care Teams include your primary Cardiologist (physician) and Advanced Practice Providers (APPs -  Physician Assistants and Nurse Practitioners) who all work together to provide you with the care you need, when you need it.  We recommend signing up for the patient portal called "MyChart".  Sign up information is provided on this After Visit Summary.  MyChart is used to connect with patients for Virtual Visits (Telemedicine).  Patients are able to view lab/test results, encounter notes, upcoming appointments, etc.  Non-urgent messages can be sent to your provider as well.   To learn more about what you can do with MyChart, go to [email or web address].    Your next appointment:   6 months with Dr. Sallyanne Kuster

## 2022-07-16 NOTE — Progress Notes (Signed)
Cardiology Office Note    Date:  07/16/2022   ID:  Nute, Healan 02/12/1951, MRN [id number]  PCP:  Sanda Klein, MD  Cardiologist:   Sanda Klein, MD   No chief complaint on file.   History of Present Illness:  [name] is a 72 y.o. male with combined systolic and diastolic heart failure, history of paroxysmal atrial flutter and paroxysmal atrial fibrillation status post re-do endocardial ablation (Dr. Curt Bears, August 2021), history of recurrent tachycardia cardiomyopathy, moderate aortic insufficiency, longstanding severe obstructive sleep apnea compliant with CPAP, essential hypertension, dyslipidemia, type 2 diabetes mellitus, history of obesity with substantial success at weight loss.  He is doing quite well.  He continues to be able to do physical activity (building a deck, remodeling her closet, carrying the wood and the carpentry tools upstairs) without shortness of breath.  He has not had orthopnea, PND or edema.  Denies dizziness lightheadedness or palpitations.  His blood pressure tends to be on the low end in the 100-105/60s.  Has not had any falls or bleeding problems on Eliquis anticoagulation.  He is very conscientiously monitoring his heart rate, blood pressure and weight at home.  On his home scale his weight is consistently in the 182-184 pounds range, which is substantially lower than recorded in our office today (at home 182.6 pounds, in office 195 pounds-I suspect this was an erroneous measurement and he actually weighs 185 pounds on our office scale.)  His follow-up echocardiogram is largely unchanged showing LVEF of 45-50% with global hypokinesis and mild LVH.  Doppler parameters show a markedly abnormal E/A ratio of 4.6: 1 and elevated E/e' ratio (16-26), which would be consistent with a restrictive filling, but may be due to left atrial mechanical failure.  The left atrium is severely dilated at ESD 5.5 cm / ESVI 67 mL/meters squaredBSA>.  The ascending  aortic maximum diameter remains unchanged at 4.3 cm, the aortic insufficiency remains moderate.  The right ventricle is mildly dilated at 4.5 cm with normal function.  The estimated systolic PA pressure is high at 61 mmHg.  The inferior vena cava was not dilated.  Interrogated his loop recorder today.  He has exhibited a heart failure trial, but we can still use the device for rhythm monitoring.  He has not had any episodes of atrial tachycardia or atrial fibrillation since early September 2023.  Life monitoring of his rhythm does show fairly frequent premature ventricular contractions (possibly premature atrial contractions with aberrant conduction).  He is not on RAAS inhibitors due to renal dysfunction, but is on hydralazine/nitrates, SGLT2 inhibitor, beta-blockers.  He is also on Ozempic.  Metabolic control is excellent with a recent hemoglobin A1c of 5.8%.  LDL was 81, HDL 40.  Creatinine is stable at approximately 1.6.   He was very ill at the end of 2020 with severe tachycardia related cardiomyopathy, severely depressed left ventricular systolic function in the setting of atrial flutter with rapid ventricular rates.  He underwent ablation with Will Camnitz, transition from dofetilide to amiodarone and then was hospitalized for optimization of his heart failure.  A loop recorder was implanted as part of a CHF trial.  He has never really been aware of the palpitations, so episodes of breakthrough arrhythmia have often gone undetected in the past.  Because of a 2-week episode of persistent atrial fibrillation detected by his loop recorder, he underwent cardioversion successfully on March 11.  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  Most recently in December 2020 he had cardiogenic shock and an EF of 10%, underwent pulmonary vein isolation and had a prolonged hospitalization on pressors.  Follow-up echo in February 2021 showed EF of 35-40%.  Prior to that in  January 2019 his ejection fraction dropped to 30-35% when he was again unaware of the persistent arrhythmia.  He underwent cardioversion and ranolazine was added to dofetilide with a return to sinus rhythm, improved functional status and improvement in left ventricular ejection fraction to 42% by echo performed in April 2019  In early 2020 he had an upper respiratory infection from which he never felt that he fully rebounded.  He noticed persistent tachycardia throughout the summer 2020 he underwent successful cardioversion on February 27, 2019 but had early recurrence of atrial fibrillation despite increased dose of dofetilide.  His clinical status steadily deteriorated.  On May 16, 2019 TEE showed LVEF was down to 10%, with severe biatrial dilation and moderate aortic insufficiency, mild mitral insufficiency.  The same day he underwent A. fib/a flutter ablation by Dr. Curt Bears and a right heart catheterization by Dr. Haroldine Laws (RA pressure 15, PA pressure 49/32, wedge pressure mean 27, cardiac index 2.8).  Follow-up echocardiogram in February 2021 showed EF of 35-40%.  He did have another cardioversion in early March for persistent atrial flutter.  His echocardiograms have shown moderate aortic insufficiency related to aortic ectasia.  He has a long-standing history of severe hypertension. He also has long-standing history of obstructive sleep apnea which was also not treated until recently, but he is now 100% CPAP-compliant. Coronary angiography 2011 did not show evidence of significant stenoses. He presented with typical atrial flutter in 2011 and has recurrent paroxysmal atrial fibrillation with a good response to treatment with dofetilide, but the dose of dofetilide was decreased when his renal function deteriorated in 2020. Additional problems include obesity, type 2 diabetes mellitus, gout and androgen deficiency. He had a successful cardioversion in October 2016 and February 2019 for persistent atrial  fibrillation.   Past Medical History:  Diagnosis Date   AORTIC STENOSIS, MODERATE 12/05/2009   Arthritis    Atrial fibrillation (HCC)    Atrial fibrillation with RVR ([place]) 07/05/2017   Atrial flutter ([place]) 12/05/2009   Atypical chest pain 11/08/2011   BENIGN PROSTATIC HYPERTROPHY, HX OF 12/05/2009   CHEST PAIN-UNSPECIFIED 11/11/2009   CHF 12/05/2009   Chronic renal insufficiency 12/16/2016   Debility 04/18/2012   Diarrhea 12/16/2016   DYSPNEA 12/19/2009   Edema 04/18/2012   ERECTILE DYSFUNCTION 01/25/2007   FATIGUE, CHRONIC 11/11/2009   Gout 07/10/2012   Heart murmur    History of kidney stones    "they passed" (07/05/2017)   Hyperkalemia 04/18/2012   Hyperlipidemia    Hypertension    HYPERTENSION 01/25/2007   Hypoxia 05/05/2011   Insomnia 05/13/2016   INSOMNIA, HX OF 01/25/2007   Knee pain, bilateral 12/28/2010   Knee pain, right 12/28/2010   NEUROMA 01/05/2010   OBESITY NOS 01/25/2007   OSA on CPAP 04/06/2010   PERIPHERAL NEUROPATHY 01/05/2010   PULMONARY FUNCTION TESTS, ABNORMAL 02/02/2010   Sleep apnea    wears CPAP   Superficial thrombophlebitis of left leg 09/07/2011   TESTICULAR HYPOFUNCTION 01/05/2010   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   Toe pain, right 07/10/2012   Type II diabetes mellitus ([place]) 12/05/2009   UNSPECIFIED ANEMIA 01/05/2010    Past Surgical History:  Procedure Laterality Date   A-FLUTTER ABLATION N/A 05/16/2019   Procedure: A-FLUTTER ABLATION;  Surgeon: Constance Haw, MD;  Location: [facility] [facility];  Service: Cardiovascular;  Laterality: N/A;   ATRIAL FIBRILLATION ABLATION N/A 05/16/2019   Procedure: ATRIAL FIBRILLATION ABLATION;  Surgeon: Constance Haw, MD;  Location: [place] [facility];  Service: Cardiovascular;  Laterality: N/A;   ATRIAL FIBRILLATION ABLATION N/A 03/10/2020   Procedure: ATRIAL FIBRILLATION ABLATION;  Surgeon: Constance Haw, MD;  Location: [place] [facility];  Service: Cardiovascular;  Laterality: N/A;    CARDIAC CATHETERIZATION  10/16/2009   nonischemic cardiomyopathy   CARDIOVERSION N/A 03/25/2015   Procedure: CARDIOVERSION;  Surgeon: Pixie Casino, MD;  Location: [facility];  Service: Cardiovascular;  Laterality: N/A;   CARDIOVERSION N/A 06/17/2017   Procedure: CARDIOVERSION;  Surgeon: Sanda Klein, MD;  Location: [place];  Service: Cardiovascular;  Laterality: N/A;   CARDIOVERSION N/A 07/08/2017   Procedure: CARDIOVERSION;  Surgeon: Fay Records, MD;  Location: [place];  Service: Cardiovascular;  Laterality: N/A;   CARDIOVERSION N/A 02/27/2019   Procedure: CARDIOVERSION;  Surgeon: Sanda Klein, MD;  Location: [place];  Service: Cardiovascular;  Laterality: N/A;   CARDIOVERSION N/A 08/16/2019   Procedure: CARDIOVERSION;  Surgeon: Sanda Klein, MD;  Location: [place];  Service: Cardiovascular;  Laterality: N/A;   COLONOSCOPY  03/10/2021   1st colonoscopy , Wilfrid Lund, [facility], large transverse polyp, clip and spot applied   METATARSAL OSTEOTOMY Bilateral ~ 1980   removed part of 5th metatarsal to corect curvature of toes    RIGHT HEART CATH N/A 05/16/2019   Procedure: RIGHT HEART CATH;  Surgeon: Jolaine Artist, MD;  Location: [place] [facility];  Service: Cardiovascular;  Laterality: N/A;   TEE WITHOUT CARDIOVERSION  05/16/2019   Procedure: Transesophageal Echocardiogram (Tee);  Surgeon: Constance Haw, MD;  Location: [place] [facility];  Service: Cardiovascular;;   TEE WITHOUT CARDIOVERSION N/A 03/07/2020   Procedure: TRANSESOPHAGEAL ECHOCARDIOGRAM (TEE);  Surgeon: Freada Bergeron, MD;  Location: [facility] END[facility];  Service: Cardiovascular;  Laterality: N/A;    Current Medications: Outpatient Medications Prior to Visit  Medication Sig Dispense Refill   Accu-Chek Softclix Lancets lancets Use as instructed 200 each 3   albuterol (VENTOLIN HFA) 108 (90 Base) MCG/ACT inhaler Inhale 2 puffs into the lungs every 6 (six) hours as needed for wheezing  or shortness of breath. 1 Inhaler 6   Alcohol Swabs (DROPSAFE ALCOHOL PREP) 70 % PADS USE AS DIRECTED 300 each 1   allopurinol (ZYLOPRIM) 300 MG tablet Take 1 tablet by mouth once daily 90 tablet 0   apixaban (ELIQUIS) 5 MG TABS tablet Take 1 tablet (5 mg total) by mouth 2 (two) times daily. 60 tablet 5   Blood Glucose Monitoring Suppl (ACCU-CHEK GUIDE ME) w/Device KIT USE TO TEST BLOOD SUGAR ONCE TO TWICE DAILY 1 kit 0   calcium carbonate (TUMS - DOSED IN MG ELEMENTAL CALCIUM) 500 MG chewable tablet Chew 500 mg by mouth daily as needed for indigestion or heartburn.     cetirizine (ZYRTEC) 10 MG tablet Take 1 tablet (10 mg total) by mouth daily. 30 tablet 11   clobetasol ointment (TEMOVATE) [id number] % 1 application to affected area     Clobetasol Prop Emollient Base (CLOBETASOL PROPIONATE E) 0.05 % emollient cream Apply 1 application topically 2 (two) times daily as needed (insect bite). 60 g 1   colchicine (COLCRYS) 0.6 MG tablet Take one tablet for 4-6 days as needed for gout flare up. 15 tablet 3   dapagliflozin propanediol (FARXIGA) 10 MG TABS tablet Take 1 tablet (10 mg total) by  mouth daily. 30 tablet 3   fish oil-omega-3 fatty acids 1000 MG capsule Take 1 g by mouth daily.     FLUZONE HIGH-DOSE QUADRIVALENT 0.7 ML SUSY      gabapentin (NEURONTIN) 100 MG capsule Take 2 capsules (200 mg total) by mouth at bedtime. 180 capsule 3   glucose blood (ACCU-CHEK GUIDE) test strip USE TO TEST BLOOD SUGAR ONCE TO TWICE DAILY 100 each 0   hydrALAZINE (APRESOLINE) 25 MG tablet TAKE 1 TABLET BY MOUTH THREE TIMES DAILY 270 tablet 1   HYDROcodone-acetaminophen (NORCO) 10-325 MG tablet Take 1 tablet by mouth every 8 (eight) hours as needed for moderate pain or severe pain. 90 tablet 0   hydrocortisone 2.5 % ointment Apply topically 2 (two) times daily as needed. 30 g 1   isosorbide mononitrate (IMDUR) 30 MG 24 hr tablet Take 1 tablet by mouth once daily 90 tablet 0   LORazepam (ATIVAN) 1 MG tablet TAKE 1 TABLET  BY MOUTH EVERY 8 HOURS AS NEEDED FOR ANXIETY 30 tablet 0   magnesium oxide (MAG-OX) 400 MG tablet Take 1 tablet (400 mg total) by mouth 2 (two) times daily. 30 tablet 11   metoprolol succinate (TOPROL-XL) 50 MG 24 hr tablet Take 1 tablet (50 mg total) by mouth in the morning and at bedtime. WITH OR FOLLOWING A MEAL 180 tablet 3   Multiple Vitamin (MULTIVITAMIN WITH MINERALS) TABS tablet Take 1 tablet by mouth daily.      Polyethyl Glycol-Propyl Glycol (SYSTANE OP) Place 1 drop into both eyes daily as needed (dry eyes).     pravastatin (PRAVACHOL) 40 MG tablet Take 1 tablet (40 mg total) by mouth daily. 90 tablet 3   Semaglutide,0.25 or 0.5MG/DOS, (OZEMPIC, 0.25 OR 0.5 MG/DOSE,) 2 MG/1.5ML SOPN Inject 0.5 mg into the skin once a week. 4.5 mL 3   tamsulosin (FLOMAX) 0.4 MG CAPS capsule Take 1 capsule by mouth once daily 30 capsule 0   temazepam (RESTORIL) 30 MG capsule Take 1 capsule by mouth at bedtime 30 capsule 2   tiZANidine (ZANAFLEX) 2 MG tablet Take 0.5-1 tablets (1-2 mg total) by mouth at bedtime as needed for muscle spasms. 3020 tablet 1   VITAMIN D PO Take by mouth.     white petrolatum (VASELINE) GEL See admin instructions.     furosemide (LASIX) 80 MG tablet Take 1 tablet (80 mg total) by mouth daily. As needed patient may take an additional 40 MG tablet as direct per [employer] study PRN plan. 90 tablet 3   potassium chloride SA (KLOR-CON M20) 20 MEQ tablet Take 1 tablet (20 mEq total) by mouth as directed. 1 tablet daily 20 mEq by mouth as directed per [employer] protocol. 90 tablet 3   No facility-administered medications prior to visit.     Allergies:   Sulfonamide derivatives and Zolpidem   Social History   Socioeconomic History   Marital status: Married    Spouse name: Not on file   Number of children: 4   Years of education: 12   Highest education level: High school graduate  Occupational History   Occupation: retired    Fish farm manager: [employer]  Tobacco Use    Smoking status: Former    Packs/day: 1.00    Years: 10.00    Total pack years: 10.00    Types: Cigarettes    Quit date: 06/07/1980    Years since quitting: 42.1   Smokeless tobacco: Never  Vaping Use   Vaping Use: Never used  Substance and Sexual Activity   Alcohol use: No   Drug use: No   Sexual activity: Not Currently  Other Topics Concern   Not on file  Social History Narrative   Lives with male partner in a one story home.  His son lives there off and on.  Retired from [employer].  Education: high school.  Right handed   Social Determinants of Health   Financial Resource Strain: Not on file  Food Insecurity: Not on file  Transportation Needs: Not on file  Physical Activity: Not on file  Stress: Stress Concern Present (04/05/2022)   [place]    Feeling of Stress : To some extent  Social Connections: Not on file     Family History:  The patient's family history includes Aneurysm in his sister; Cancer in his father; Clotting disorder in his mother; Diabetes in his mother and sister; Heart attack in his mother; Heart disease in his mother; Hyperlipidemia in his mother; Hypertension in his mother and sister; Leukemia in his maternal grandmother; Lung disease in his father; Other in his sister; Seizures in his sister; Stroke in his mother.   ROS:   Please see the history of present illness.    ROS All other systems are reviewed and are negative.  PHYSICAL EXAM:   VS:  BP 124/72   Pulse 88   Ht 5' 11"$  (1.803 m)   Wt 195 lb (88.5 kg)   SpO2 98%   BMI 27.20 kg/m       General: Alert, oriented x3, no distress, appears minimally overweight Head: no evidence of trauma, PERRL, EOMI, no exophtalmos or lid lag, no myxedema, no xanthelasma; normal ears, nose and oropharynx Neck: normal jugular venous pulsations and no hepatojugular reflux; brisk carotid pulses without delay and no carotid bruits Chest: clear  to auscultation, no signs of consolidation by percussion or palpation, normal fremitus, symmetrical and full respiratory excursions Cardiovascular: normal position and quality of the apical impulse, regular rhythm with frequent isolated ectopic beats, normal first and second heart sounds, 2/6 early peaking aortic ejection murmur, 2/6 decrescendo diastolic murmur at the left lower sternal border, no rubs or gallops Abdomen: no tenderness or distention, no masses by palpation, no abnormal pulsatility or arterial bruits, normal bowel sounds, no hepatosplenomegaly Extremities: no clubbing, cyanosis or edema; 2+ radial, ulnar and brachial pulses bilaterally; 2+ right femoral, posterior tibial and dorsalis pedis pulses; 2+ left femoral, posterior tibial and dorsalis pedis pulses; no subclavian or femoral bruits Neurological: grossly nonfocal Psych: Normal mood and affect    Wt Readings from Last 3 Encounters:  07/16/22 195 lb (88.5 kg)  06/09/22 184 lb (83.5 kg)  05/04/22 185 lb 9.6 oz (84.2 kg)   Studies/Labs Reviewed:   ECHO 07/02/2022  1. Left ventricular ejection fraction, by estimation, is 45 to 50%. The  left ventricle has mildly decreased function. The left ventricle  demonstrates global hypokinesis. There is mild concentric left ventricular  hypertrophy. Left ventricular diastolic  parameters are consistent with Grade I diastolic dysfunction (impaired  relaxation). Elevated left ventricular end-diastolic pressure.   2. Right ventricular systolic function is normal. The right ventricular  size is normal. There is severely elevated pulmonary artery systolic  pressure.   3. Left atrial size was severely dilated.   4. Right atrial size was mildly dilated.   5. The mitral valve is normal in structure. Mild to moderate mitral valve  regurgitation. No evidence of mitral stenosis.  6. The aortic valve is tricuspid. Aortic valve regurgitation is moderate  to severe. No aortic stenosis is  present.   7. Pulmonic valve regurgitation is moderate.   8. Aortic dilatation noted. There is moderate dilatation of the aortic  root, measuring 43 mm. There is mild dilatation of the ascending aorta,  measuring 41 mm.   9. The inferior vena cava is normal in size with greater than 50%  respiratory variability, suggesting right atrial pressure of 3 mmHg.   TEE 03/07/2020: 1. Left ventricular ejection fraction, by estimation, is 35 to 40%. The  left ventricle has moderately decreased function. The left ventricle  demonstrates global hypokinesis.   2. Iatrogenic PFO visualized from prior ablation procedure.   3. Right ventricular systolic function is mildly reduced. The right  ventricular size is mildly enlarged.   4. Left atrial size was severely dilated. No left atrial/left atrial  appendage thrombus was detected.   5. The mitral valve is normal in structure. Mild mitral valve  regurgitation.   6. The aortic valve is tricuspid. There is mild calcification of the  aortic valve. There is mild thickening of the aortic valve. Aortic valve  regurgitation is mild. Mild aortic valve sclerosis is present, with no  evidence of aortic valve stenosis.   7. Aortic dilatation noted. There is mild dilatation of the ascending  aorta, measuring 37 mm. There is mild dilatation of the aortic root,  measuring 41 mm. There is Moderate (Grade III) plaque.   EKG:  EKG is not ordered today.  Download of his loop recorder in the office today shows that his rhythm is sinus with occasional PVCs (possibly PACs with aberrant conduction).  04/14/2022 tracing personally reviewed shows sinus rhythm with first-degree AV block and occasional PVCs, incomplete left bundle branch block.  PR interval is 246 ms.  QRS is 100 ms, QTc 432 ms Lipid Panel    Component Value Date/Time   CHOL 138 03/09/2022 1159   TRIG 86.0 03/09/2022 1159   HDL 39.80 03/09/2022 1159   CHOLHDL 3 03/09/2022 1159   VLDL 17.2 03/09/2022 1159    LDLCALC 81 03/09/2022 1159   LDLDIRECT 84.0 05/02/2018 1523     ASSESSMENT:    1. Chronic combined systolic and diastolic heart failure ([place])   2. PAF (paroxysmal atrial fibrillation) ([place])   3. Acquired thrombophilia ([place])   4. PAH (pulmonary artery hypertension) ([place])   5. OSA (obstructive sleep apnea)   6. Essential hypertension   7. Stage 3b chronic kidney disease ([place])   8. Controlled type 2 diabetes mellitus with diabetic polyneuropathy, without long-term current use of insulin ([place])   9. Dyslipidemia (high LDL; low HDL)   10. Nonrheumatic aortic valve insufficiency   11. Status post placement of implantable loop recorder       PLAN:  In order of problems listed above:  CHF: Functional class I, clinically euvolemic.  He has nonischemic cardiomyopathy.  He is taking SGLT2 inhibitors, hydralazine/nitrates, beta-blocker and a moderate dose of loop diuretic but is not on RAAS inhibitors due to renal dysfunction.  Suspect that he has some degree of cardiomyopathy due to "burned-out" hypertension that explains his mildly depressed LVEF.  In the past he has had periods of severely depressed LV function due to tachycardia cardiomyopathy with ejection fraction as low as 10%. AFlutter/Afib: Excellent results following his ablation in 2020.  He has rare episodes of atrial tachycardia and very brief atrial fibrillation.  He has not had any meaningful arrhythmia in  the last 4 months as evidenced on his loop recorder.  On Eliquis for CHA2DS2-VASc score of 4 (age, HTN, CHF, history of diabetes mellitus). Anticoagulation: No bleeding problems, no falls.  Stable hemoglobin. PAH: Part of this may be due to longstanding precapillary pulmonary hypertension from untreated sleep apnea.  Cannot exclude the fact that he may have elevated left atrial pressure based on the echo findings, but it is hard to interpret these in the setting of a markedly dilated left atrium that is probably mechanically  ineffective.  Normal right ventricular systolic function and normal right atrial pressures. OSA: 100% compliance with CPAP.  He had severe obstructive sleep apnea that went untreated for many years, probably decades.  He has lost substantial weight and is now almost at ideal body weight.  He denies daytime hypersomnolence. HTN: As he is losing weight, his blood pressure is getting lower and lower.  Now in the low 100s/60s.  May need to reduce his hydralazine if he becomes symptomatic with hypotension. CKD3B: Most recent creatinine is even better than previous estimated baseline down to 1.59.  Previously we thought that his baseline is around 1.9.  His nephrologist is Dr. Juleen China.  On SGLT2 inhibitors.  No recent gout flares. DM: Excellent control.  He should continue [place] even if he no longer requires it for diabetes, since it is helpful for heart failure.   HLP: Good lipid profile on pravastatin.  HDL is now almost normal, following weight loss.  LDL less than 100 is acceptable this is never had any evidence of CAD/PAD. AI: Secondary to aortic annular ectasia and very stable in severity over many years.  Continue to follow yearly. ILR: Still has good battery function.  Will continue to download information from the device as long as it can be used.   Medication Adjustments/Labs and Tests Ordered: Current medicines are reviewed at length with the patient today.  Concerns regarding medicines are outlined above.  Medication changes, Labs and Tests ordered today are listed in the Patient Instructions below. Patient Instructions  Medication Instructions:  Your physician recommends that you continue on your current medications as directed. Please refer to the Current Medication list given to you today.  *If you need a refill on your cardiac medications before your next appointment, please call your pharmacy*   Testing/Procedures: Your physician has requested that you have an echocardiogram.  Echocardiography is a painless test that uses sound waves to create images of your heart. It provides your doctor with information about the size and shape of your heart and how well your heart's chambers and valves are working. This procedure takes approximately one hour. There are no restrictions for this procedure. Please do NOT wear cologne, perfume, aftershave, or lotions (deodorant is allowed). Please arrive 15 minutes prior to your appointment time. DUE in 1 year - February 2025   Follow-Up: At [facility], you and your health needs are our priority.  As part of our continuing mission to provide you with exceptional heart care, we have created designated Provider Care Teams.  These Care Teams include your primary Cardiologist (physician) and Advanced Practice Providers (APPs -  Physician Assistants and Nurse Practitioners) who all work together to provide you with the care you need, when you need it.  We recommend signing up for the patient portal called "MyChart".  Sign up information is provided on this After Visit Summary.  MyChart is used to connect with patients for Virtual Visits (Telemedicine).  Patients are able to  view lab/test results, encounter notes, upcoming appointments, etc.  Non-urgent messages can be sent to your provider as well.   To learn more about what you can do with MyChart, go to [email or web address].    Your next appointment:   6 months with Dr. Sallyanne Kuster    Signed, Sanda Klein, MD  07/16/2022 5:31 PM    [facility] [address], [place], [place]  [address] Phone: [phone]; Fax: [phone]

## 2022-07-17 ENCOUNTER — Other Ambulatory Visit: Payer: Self-pay | Admitting: Family Medicine

## 2022-07-20 ENCOUNTER — Encounter: Payer: Self-pay | Admitting: Cardiovascular Disease

## 2022-07-20 ENCOUNTER — Telehealth: Payer: Self-pay | Admitting: *Deleted

## 2022-07-20 NOTE — Telephone Encounter (Signed)
Eliquis patient assistance faxed to [facility]  Farxiga patient assitance faxed to [employer]

## 2022-07-22 ENCOUNTER — Other Ambulatory Visit: Payer: Self-pay

## 2022-07-22 DIAGNOSIS — Z006 Encounter for examination for normal comparison and control in clinical research program: Secondary | ICD-10-CM

## 2022-07-22 MED ORDER — FUROSEMIDE 80 MG PO TABS: ORAL_TABLET | ORAL | 1 refills | Status: DC

## 2022-07-29 DIAGNOSIS — I5042 Chronic combined systolic (congestive) and diastolic (congestive) heart failure: Secondary | ICD-10-CM | POA: Diagnosis not present

## 2022-07-29 DIAGNOSIS — E1122 Type 2 diabetes mellitus with diabetic chronic kidney disease: Secondary | ICD-10-CM | POA: Diagnosis not present

## 2022-07-29 DIAGNOSIS — N1832 Chronic kidney disease, stage 3b: Secondary | ICD-10-CM | POA: Diagnosis not present

## 2022-07-29 DIAGNOSIS — N2581 Secondary hyperparathyroidism of renal origin: Secondary | ICD-10-CM | POA: Diagnosis not present

## 2022-07-29 DIAGNOSIS — M109 Gout, unspecified: Secondary | ICD-10-CM | POA: Diagnosis not present

## 2022-07-29 DIAGNOSIS — I129 Hypertensive chronic kidney disease with stage 1 through stage 4 chronic kidney disease, or unspecified chronic kidney disease: Secondary | ICD-10-CM | POA: Diagnosis not present

## 2022-08-02 DIAGNOSIS — I10 Essential (primary) hypertension: Secondary | ICD-10-CM | POA: Diagnosis not present

## 2022-08-02 DIAGNOSIS — I129 Hypertensive chronic kidney disease with stage 1 through stage 4 chronic kidney disease, or unspecified chronic kidney disease: Secondary | ICD-10-CM | POA: Diagnosis not present

## 2022-08-02 DIAGNOSIS — I5042 Chronic combined systolic (congestive) and diastolic (congestive) heart failure: Secondary | ICD-10-CM | POA: Diagnosis not present

## 2022-08-06 NOTE — Telephone Encounter (Signed)
error 

## 2022-08-09 ENCOUNTER — Other Ambulatory Visit: Payer: Self-pay | Admitting: Internal Medicine

## 2022-08-09 ENCOUNTER — Other Ambulatory Visit: Payer: Self-pay | Admitting: Family Medicine

## 2022-08-09 DIAGNOSIS — E1169 Type 2 diabetes mellitus with other specified complication: Secondary | ICD-10-CM

## 2022-08-09 NOTE — Telephone Encounter (Signed)
Requesting: temazepam '30mg'$   Contract:07/07/21 UDS:07/07/21 Last Visit: 03/09/22 Next Visit: 09/21/22 Last Refill: 05/10/22 #30 and 2RF   Please Advise

## 2022-08-11 DIAGNOSIS — R972 Elevated prostate specific antigen [PSA]: Secondary | ICD-10-CM | POA: Diagnosis not present

## 2022-08-20 ENCOUNTER — Ambulatory Visit: Payer: Medicare HMO | Admitting: Podiatry

## 2022-08-20 ENCOUNTER — Encounter: Payer: Self-pay | Admitting: Podiatry

## 2022-08-20 VITALS — BP 101/65 | HR 75

## 2022-08-20 DIAGNOSIS — M2041 Other hammer toe(s) (acquired), right foot: Secondary | ICD-10-CM

## 2022-08-20 DIAGNOSIS — B351 Tinea unguium: Secondary | ICD-10-CM | POA: Diagnosis not present

## 2022-08-20 DIAGNOSIS — M79675 Pain in left toe(s): Secondary | ICD-10-CM | POA: Diagnosis not present

## 2022-08-20 DIAGNOSIS — M2011 Hallux valgus (acquired), right foot: Secondary | ICD-10-CM

## 2022-08-20 DIAGNOSIS — M79674 Pain in right toe(s): Secondary | ICD-10-CM | POA: Diagnosis not present

## 2022-08-20 DIAGNOSIS — E1142 Type 2 diabetes mellitus with diabetic polyneuropathy: Secondary | ICD-10-CM

## 2022-08-20 NOTE — Progress Notes (Signed)
  Subjective:  Patient ID: [name], male    DOB: [birth year],  MRN: [id number]  [name] presents to clinic today for at risk foot care with history of diabetic neuropathy and painful elongated mycotic toenails 1-5 bilaterally which are tender when wearing enclosed shoe gear. Pain is relieved with periodic professional debridement.   Patient is requesting new diabetic shoes. Chief Complaint  Patient presents with   Nail Problem    Trim,per patient A1C:5.8,am BS-107   Charlett Blake, Stacey,pcp      New problem(s): None.   PCP is [name], MD. Last visit was 03/09/2022.  Allergies  Allergen Reactions   Sulfonamide Derivatives Other (See Comments)    Flu like symptoms    Zolpidem Other (See Comments)    Excessive, prolonged sedation    Review of Systems: Negative except as noted in the HPI.  Objective: No changes noted in today's physical examination. Vitals:   08/20/22 0805  BP: 101/65  Pulse: 75   [name] is a pleasant 72 y.o. male WD, WN in NAD. AAO x 3.  Vascular CFT immediate b/l LE. Palpable DP/PT pulses b/l LE. Digital hair sparse b/l. Skin temperature gradient WNL b/l. No pain with calf compression b/l. No edema noted b/l. No cyanosis or clubbing noted b/l LE.  Neurologic Pt has subjective symptoms of neuropathy. Protective sensation intact 5/5 intact bilaterally with 10g monofilament b/l. Vibratory sensation intact b/l.  Dermatologic Pedal integument with normal turgor, texture and tone b/l LE. No open wounds b/l. No interdigital macerations b/l. Toenails 1-5 b/l elongated, thickened, discolored with subungual debris. +Tenderness with dorsal palpation of nailplates. No hyperkeratotic nor porokeratotic lesions present on today's visit.  Orthopedic: Muscle strength 5/5 to all lower extremity muscle groups bilaterally. No pain, crepitus or joint limitation noted with ROM bilateral LE. Hammertoe deformity noted 2-5 b/l.   Assessment/Plan: Orders  Placed This Encounter  Procedures   For Home Use Only DME Diabetic Shoe    Dispense one pair extra depth shoes and 3 pair total contact insoles.    1. Pain due to onychomycosis of toenails of both feet   2. Acquired hammertoes of both feet   3. Hallux valgus, acquired, bilateral   4. Diabetic peripheral neuropathy associated with type 2 diabetes mellitus ([place])     -Patient was evaluated and treated. All patient's and/or POA's questions/concerns answered on today's visit. -Patient to continue soft, supportive shoe gear daily. -Discussed diabetic shoe benefit available based on patient's diagnoses. Order entered for one pair extra depth shoes and 3 pair total contact insoles. Patient qualifies based on diagnoses. -Mycotic toenails 1-5 bilaterally were debrided in length and girth with sterile nail nippers and dremel without incident. -Patient/POA to call should there be question/concern in the interim.   Return in about 3 months (around 11/20/2022).  Marzetta Board, DPM

## 2022-09-06 ENCOUNTER — Other Ambulatory Visit: Payer: Self-pay | Admitting: Family Medicine

## 2022-09-06 ENCOUNTER — Ambulatory Visit (INDEPENDENT_AMBULATORY_CARE_PROVIDER_SITE_OTHER): Payer: Medicare HMO

## 2022-09-06 DIAGNOSIS — M2041 Other hammer toe(s) (acquired), right foot: Secondary | ICD-10-CM

## 2022-09-06 DIAGNOSIS — M2012 Hallux valgus (acquired), left foot: Secondary | ICD-10-CM

## 2022-09-06 DIAGNOSIS — M2011 Hallux valgus (acquired), right foot: Secondary | ICD-10-CM

## 2022-09-06 DIAGNOSIS — E1142 Type 2 diabetes mellitus with diabetic polyneuropathy: Secondary | ICD-10-CM

## 2022-09-06 DIAGNOSIS — M2042 Other hammer toe(s) (acquired), left foot: Secondary | ICD-10-CM

## 2022-09-06 NOTE — Telephone Encounter (Signed)
Requesting: temazepam 30mg   Contract:  07/07/21 UDS: 07/07/21 Last Visit: 03/09/22 Next Visit: 09/21/22 Last Refill:08/09/22 #30 and 0RF   Please Advise

## 2022-09-06 NOTE — Progress Notes (Signed)
Patient presents to the office today for diabetic shoe and insole measuring.  Patient was measured with brannock device to determine size and width for 1 pair of extra depth shoes and foam casted for 3 pair of insoles.   ABN signed.   Documentation of medical necessity will be sent to patient's treating diabetic doctor to verify and sign.   Patient's diabetic provider: Philemon Kingdom, MD   Shoes and insoles will be ordered at that time and patient will be notified for an appointment for fitting when they arrive.   Brannock measurement: 12W  Patient shoe selection-   1st   Shoe choice:   [id number]  Shoe size ordered: 12W

## 2022-09-11 ENCOUNTER — Other Ambulatory Visit: Payer: Self-pay | Admitting: Family Medicine

## 2022-09-12 ENCOUNTER — Other Ambulatory Visit: Payer: Self-pay | Admitting: Cardiology

## 2022-09-20 NOTE — Assessment & Plan Note (Signed)
Hydrate and monitor 

## 2022-09-20 NOTE — Assessment & Plan Note (Signed)
Rate controlled and tolerating meds 

## 2022-09-20 NOTE — Assessment & Plan Note (Addendum)
Encouraged DASH or MIND diet, decrease po intake and increase exercise as tolerated. Needs 7-8 hours of sleep nightly. Avoid trans fats, eat small, frequent meals every 4-5 hours with lean proteins, complex carbs and healthy fats. Minimize simple carbs, high fat foods and processed foods [id number] acceptable, continue to monitor

## 2022-09-20 NOTE — Assessment & Plan Note (Signed)
Patient encouraged to maintain heart healthy diet, regular exercise, adequate sleep. Consider daily probiotics. Take medications as prescribed. Labs ordered and reviewed. Given and reviewed copy of ACP documents from [facility] Secretary of State and encouraged to complete and return  Colonoscopy 2023 repeat in 2028

## 2022-09-21 ENCOUNTER — Encounter: Payer: Medicare HMO | Admitting: Family Medicine

## 2022-09-21 ENCOUNTER — Ambulatory Visit (INDEPENDENT_AMBULATORY_CARE_PROVIDER_SITE_OTHER): Payer: Medicare HMO | Admitting: Family Medicine

## 2022-09-21 ENCOUNTER — Encounter: Payer: Self-pay | Admitting: Family Medicine

## 2022-09-21 VITALS — BP 122/72 | HR 77 | Temp 98.5°F | Resp 16 | Ht 71.0 in | Wt 183.2 lb

## 2022-09-21 DIAGNOSIS — M25562 Pain in left knee: Secondary | ICD-10-CM

## 2022-09-21 DIAGNOSIS — N189 Chronic kidney disease, unspecified: Secondary | ICD-10-CM

## 2022-09-21 DIAGNOSIS — M1A9XX0 Chronic gout, unspecified, without tophus (tophi): Secondary | ICD-10-CM

## 2022-09-21 DIAGNOSIS — E669 Obesity, unspecified: Secondary | ICD-10-CM

## 2022-09-21 DIAGNOSIS — E782 Mixed hyperlipidemia: Secondary | ICD-10-CM | POA: Diagnosis not present

## 2022-09-21 DIAGNOSIS — Z0000 Encounter for general adult medical examination without abnormal findings: Secondary | ICD-10-CM

## 2022-09-21 DIAGNOSIS — E1169 Type 2 diabetes mellitus with other specified complication: Secondary | ICD-10-CM

## 2022-09-21 DIAGNOSIS — Z79899 Other long term (current) drug therapy: Secondary | ICD-10-CM

## 2022-09-21 DIAGNOSIS — I4891 Unspecified atrial fibrillation: Secondary | ICD-10-CM

## 2022-09-21 DIAGNOSIS — M25561 Pain in right knee: Secondary | ICD-10-CM

## 2022-09-21 MED ORDER — TIZANIDINE HCL 2 MG PO TABS: 1.0000 mg | ORAL_TABLET | Freq: Every evening | ORAL | 1 refills | Status: DC | PRN

## 2022-09-21 NOTE — Patient Instructions (Signed)
Tetanus booster at pharmacy  Shingrix is the new shingles shot, 2 shots over 2-6 months, confirm coverage with insurance and document, then can return here for shots with nurse appt or at pharmacy   RSV, Respiratory Syncitial Virus Vaccine, Arexvy at pharmacy  Preventive Care 46 Years and Older, Male Preventive care refers to lifestyle choices and visits with your health care provider that can promote health and wellness. Preventive care visits are also called wellness exams. What can I expect for my preventive care visit? Counseling During your preventive care visit, your health care provider may ask about your: Medical history, including: Past medical problems. Family medical history. History of falls. Current health, including: Emotional well-being. Home life and relationship well-being. Sexual activity. Memory and ability to understand (cognition). Lifestyle, including: Alcohol, nicotine or tobacco, and drug use. Access to firearms. Diet, exercise, and sleep habits. Work and work Astronomer. Sunscreen use. Safety issues such as seatbelt and bike helmet use. Physical exam Your health care provider will check your: Height and weight. These may be used to calculate your BMI (body mass index). BMI is a measurement that tells if you are at a healthy weight. Waist circumference. This measures the distance around your waistline. This measurement also tells if you are at a healthy weight and may help predict your risk of certain diseases, such as type 2 diabetes and high blood pressure. Heart rate and blood pressure. Body temperature. Skin for abnormal spots. What immunizations do I need?  Vaccines are usually given at various ages, according to a schedule. Your health care provider will recommend vaccines for you based on your age, medical history, and lifestyle or other factors, such as travel or where you work. What tests do I need? Screening Your health care provider may  recommend screening tests for certain conditions. This may include: Lipid and cholesterol levels. Diabetes screening. This is done by checking your blood sugar (glucose) after you have not eaten for a while (fasting). Hepatitis C test. Hepatitis B test. HIV (human immunodeficiency virus) test. STI (sexually transmitted infection) testing, if you are at risk. Lung cancer screening. Colorectal cancer screening. Prostate cancer screening. Abdominal aortic aneurysm (AAA) screening. You may need this if you are a current or former smoker. Talk with your health care provider about your test results, treatment options, and if necessary, the need for more tests. Follow these instructions at home: Eating and drinking  Eat a diet that includes fresh fruits and vegetables, whole grains, lean protein, and low-fat dairy products. Limit your intake of foods with high amounts of sugar, saturated fats, and salt. Take vitamin and mineral supplements as recommended by your health care provider. Do not drink alcohol if your health care provider tells you not to drink. If you drink alcohol: Limit how much you have to 0-2 drinks a day. Know how much alcohol is in your drink. In the [place] one drink equals one 12 oz bottle of beer (355 mL), one 5 oz glass of wine (148 mL), or one 1 oz glass of hard liquor (44 mL). Lifestyle Brush your teeth every morning and night with fluoride toothpaste. Floss one time each day. Exercise for at least 30 minutes 5 or more days each week. Do not use any products that contain nicotine or tobacco. These products include cigarettes, chewing tobacco, and vaping devices, such as e-cigarettes. If you need help quitting, ask your health care provider. Do not use drugs. If you are sexually active, practice safe sex. Use a condom  or other form of protection to prevent STIs. Take aspirin only as told by your health care provider. Make sure that you understand how much to take and what  form to take. Work with your health care provider to find out whether it is safe and beneficial for you to take aspirin daily. Ask your health care provider if you need to take a cholesterol-lowering medicine (statin). Find healthy ways to manage stress, such as: Meditation, yoga, or listening to music. Journaling. Talking to a trusted person. Spending time with friends and family. Safety Always wear your seat belt while driving or riding in a vehicle. Do not drive: If you have been drinking alcohol. Do not ride with someone who has been drinking. When you are tired or distracted. While texting. If you have been using any mind-altering substances or drugs. Wear a helmet and other protective equipment during sports activities. If you have firearms in your house, make sure you follow all gun safety procedures. Minimize exposure to UV radiation to reduce your risk of skin cancer. What's next? Visit your health care provider once a year for an annual wellness visit. Ask your health care provider how often you should have your eyes and teeth checked. Stay up to date on all vaccines. This information is not intended to replace advice given to you by your health care provider. Make sure you discuss any questions you have with your health care provider. Document Revised: 11/19/2020 Document Reviewed: 11/19/2020 Elsevier Patient Education  2023 [employer].

## 2022-09-21 NOTE — Progress Notes (Unsigned)
Subjective:    Patient ID: [name], male    DOB: 1950-10-23, 72 y.o.   MRN: [id number]  No chief complaint on file.   HPI Patient is in today for follow up on chronic medical concerns and annual preventative exam. No recent febrile illness or hospitalizations. Denies CP/palp/SOB/HA/congestion/fevers/GI or GU c/o. Taking meds as prescribed   Past Medical History:  Diagnosis Date   AORTIC STENOSIS, MODERATE 12/05/2009   Arthritis    Atrial fibrillation (HCC)    Atrial fibrillation with RVR (HCC) 07/05/2017   Atrial flutter (HCC) 12/05/2009   Atypical chest pain 11/08/2011   BENIGN PROSTATIC HYPERTROPHY, HX OF 12/05/2009   CHEST PAIN-UNSPECIFIED 11/11/2009   CHF 12/05/2009   Chronic renal insufficiency 12/16/2016   Debility 04/18/2012   Diarrhea 12/16/2016   DYSPNEA 12/19/2009   Edema 04/18/2012   ERECTILE DYSFUNCTION 01/25/2007   FATIGUE, CHRONIC 11/11/2009   Gout 07/10/2012   Heart murmur    History of kidney stones    "they passed" (07/05/2017)   Hyperkalemia 04/18/2012   Hyperlipidemia    Hypertension    HYPERTENSION 01/25/2007   Hypoxia 05/05/2011   Insomnia 05/13/2016   INSOMNIA, HX OF 01/25/2007   Knee pain, bilateral 12/28/2010   Knee pain, right 12/28/2010   NEUROMA 01/05/2010   OBESITY NOS 01/25/2007   OSA on CPAP 04/06/2010   PERIPHERAL NEUROPATHY 01/05/2010   PULMONARY FUNCTION TESTS, ABNORMAL 02/02/2010   Sleep apnea    wears CPAP   Superficial thrombophlebitis of left leg 09/07/2011   TESTICULAR HYPOFUNCTION 01/05/2010   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   Toe pain, right 07/10/2012   Type II diabetes mellitus (HCC) 12/05/2009   UNSPECIFIED ANEMIA 01/05/2010    Past Surgical History:  Procedure Laterality Date   A-FLUTTER ABLATION N/A 05/16/2019   Procedure: A-FLUTTER ABLATION;  Surgeon: Regan Lemming, MD;  Location: [facility];  Service: Cardiovascular;  Laterality: N/A;   ATRIAL FIBRILLATION ABLATION N/A 05/16/2019    Procedure: ATRIAL FIBRILLATION ABLATION;  Surgeon: Regan Lemming, MD;  Location: [facility];  Service: Cardiovascular;  Laterality: N/A;   ATRIAL FIBRILLATION ABLATION N/A 03/10/2020   Procedure: ATRIAL FIBRILLATION ABLATION;  Surgeon: Regan Lemming, MD;  Location: [facility];  Service: Cardiovascular;  Laterality: N/A;   CARDIAC CATHETERIZATION  10/16/2009   nonischemic cardiomyopathy   CARDIOVERSION N/A 03/25/2015   Procedure: CARDIOVERSION;  Surgeon: Chrystie Nose, MD;  Location: [facility] ENDOSCOPY;  Service: Cardiovascular;  Laterality: N/A;   CARDIOVERSION N/A 06/17/2017   Procedure: CARDIOVERSION;  Surgeon: Thurmon Fair, MD;  Location: [facility] ENDOSCOPY;  Service: Cardiovascular;  Laterality: N/A;   CARDIOVERSION N/A 07/08/2017   Procedure: CARDIOVERSION;  Surgeon: Pricilla Riffle, MD;  Location: [facility] ENDOSCOPY;  Service: Cardiovascular;  Laterality: N/A;   CARDIOVERSION N/A 02/27/2019   Procedure: CARDIOVERSION;  Surgeon: Thurmon Fair, MD;  Location: [facility] ENDOSCOPY;  Service: Cardiovascular;  Laterality: N/A;   CARDIOVERSION N/A 08/16/2019   Procedure: CARDIOVERSION;  Surgeon: Thurmon Fair, MD;  Location: [facility] ENDOSCOPY;  Service: Cardiovascular;  Laterality: N/A;   COLONOSCOPY  03/10/2021   1st colonoscopy , Amada Jupiter, [facility], large transverse polyp, clip and spot applied   METATARSAL OSTEOTOMY Bilateral ~ 1980   removed part of 5th metatarsal to corect curvature of toes    RIGHT HEART CATH N/A 05/16/2019   Procedure: RIGHT HEART CATH;  Surgeon: Dolores Patty, MD;  Location: [facility];  Service: Cardiovascular;  Laterality: N/A;   TEE WITHOUT CARDIOVERSION  05/16/2019   Procedure: Transesophageal Echocardiogram (Tee);  Surgeon: Regan Lemming, MD;  Location: [facility] INVASIVE CV LAB;  Service: Cardiovascular;;   TEE WITHOUT CARDIOVERSION N/A 03/07/2020   Procedure: TRANSESOPHAGEAL ECHOCARDIOGRAM (TEE);  Surgeon: Meriam Sprague, MD;  Location:  [facility] ENDOSCOPY;  Service: Cardiovascular;  Laterality: N/A;    Family History  Problem Relation Age of Onset   Clotting disorder Mother    Heart disease Mother        s/p MI   Heart attack Mother    Hypertension Mother    Diabetes Mother    Hyperlipidemia Mother    Stroke Mother    Cancer Father        ? lung   Lung disease Father        smoker   Diabetes Sister    Hypertension Sister        smoker   Aneurysm Sister        brain   Other Sister        clipped   Seizures Sister        d/o w/aneurysm/ smoker   Leukemia Maternal Grandmother        ?   Colon cancer Neg Hx    Colon polyps Neg Hx    Stomach cancer Neg Hx    Rectal cancer Neg Hx    Esophageal cancer Neg Hx     Social History   Socioeconomic History   Marital status: Married    Spouse name: Not on file   Number of children: 4   Years of education: 12   Highest education level: High school graduate  Occupational History   Occupation: retired    Associate Professor: [employer]  Tobacco Use   Smoking status: Former    Packs/day: 1.00    Years: 10.00    Additional pack years: 0.00    Total pack years: 10.00    Types: Cigarettes    Quit date: 06/07/1980    Years since quitting: 42.3   Smokeless tobacco: Never  Vaping Use   Vaping Use: Never used  Substance and Sexual Activity   Alcohol use: No   Drug use: No   Sexual activity: Not Currently  Other Topics Concern   Not on file  Social History Narrative   Lives with male partner in a one story home.  His son lives there off and on.  Retired from [employer].  Education: high school.  Right handed   Social Determinants of Health   Financial Resource Strain: Not on file  Food Insecurity: Not on file  Transportation Needs: Not on file  Physical Activity: Not on file  Stress: Stress Concern Present (04/05/2022)   [employer] of Occupational Health - Occupational Stress Questionnaire    Feeling of Stress : To some extent  Social Connections: Not on file   Intimate Partner Violence: Not on file    Outpatient Medications Prior to Visit  Medication Sig Dispense Refill   Accu-Chek Softclix Lancets lancets Use as instructed 200 each 3   albuterol (VENTOLIN HFA) 108 (90 Base) MCG/ACT inhaler Inhale 2 puffs into the lungs every 6 (six) hours as needed for wheezing or shortness of breath. 1 Inhaler 6   Alcohol Swabs (DROPSAFE ALCOHOL PREP) 70 % PADS USE AS DIRECTED 300 each 1   allopurinol (ZYLOPRIM) 300 MG tablet Take 1 tablet by mouth once daily 90 tablet 0   apixaban (ELIQUIS) 5 MG TABS tablet Take 1 tablet (5 mg total) by mouth  2 (two) times daily. 60 tablet 5   Blood Glucose Monitoring Suppl (ACCU-CHEK GUIDE ME) w/Device KIT USE TO TEST BLOOD SUGAR ONCE TO TWICE DAILY 1 kit 0   calcium carbonate (TUMS - DOSED IN MG ELEMENTAL CALCIUM) 500 MG chewable tablet Chew 500 mg by mouth daily as needed for indigestion or heartburn.     cetirizine (ZYRTEC) 10 MG tablet Take 1 tablet (10 mg total) by mouth daily. 30 tablet 11   clobetasol ointment (TEMOVATE) 0.05 % 1 application to affected area     Clobetasol Prop Emollient Base (CLOBETASOL PROPIONATE E) 0.05 % emollient cream Apply 1 application topically 2 (two) times daily as needed (insect bite). 60 g 1   colchicine (COLCRYS) 0.6 MG tablet Take one tablet for 4-6 days as needed for gout flare up. 15 tablet 3   dapagliflozin propanediol (FARXIGA) 10 MG TABS tablet Take 1 tablet (10 mg total) by mouth daily. 30 tablet 3   fish oil-omega-3 fatty acids 1000 MG capsule Take 1 g by mouth daily.     FLUZONE HIGH-DOSE QUADRIVALENT 0.7 ML SUSY      furosemide (LASIX) 80 MG tablet Take 1 tablet (80 mg total) by mouth daily. As needed patient may take an additional 40 MG tablet as direct per [employer] study PRN plan. 90 tablet 1   gabapentin (NEURONTIN) 100 MG capsule Take 2 capsules (200 mg total) by mouth at bedtime. 180 capsule 3   glucose blood (ACCU-CHEK GUIDE) test strip USE TO TEST BLOOD SUGAR ONCE  TO TWICE DAILY 200 each 1   hydrALAZINE (APRESOLINE) 25 MG tablet TAKE 1 TABLET BY MOUTH THREE TIMES DAILY 270 tablet 1   HYDROcodone-acetaminophen (NORCO) 10-325 MG tablet Take 1 tablet by mouth every 8 (eight) hours as needed for moderate pain or severe pain. 90 tablet 0   hydrocortisone 2.5 % ointment Apply topically 2 (two) times daily as needed. 30 g 1   isosorbide mononitrate (IMDUR) 30 MG 24 hr tablet Take 1 tablet by mouth once daily 90 tablet 0   LORazepam (ATIVAN) 1 MG tablet TAKE 1 TABLET BY MOUTH EVERY 8 HOURS AS NEEDED FOR ANXIETY 30 tablet 0   magnesium oxide (MAG-OX) 400 MG tablet Take 1 tablet (400 mg total) by mouth 2 (two) times daily. 30 tablet 11   metoprolol succinate (TOPROL-XL) 50 MG 24 hr tablet Take 1 tablet (50 mg total) by mouth in the morning and at bedtime. WITH OR FOLLOWING A MEAL 180 tablet 3   Multiple Vitamin (MULTIVITAMIN WITH MINERALS) TABS tablet Take 1 tablet by mouth daily.      Polyethyl Glycol-Propyl Glycol (SYSTANE OP) Place 1 drop into both eyes daily as needed (dry eyes).     potassium chloride SA (KLOR-CON M20) 20 MEQ tablet Take 1 tablet (20 mEq total) by mouth as directed. 1 tablet daily 20 mEq by mouth as directed per alleviate research protocol. 90 tablet 3   pravastatin (PRAVACHOL) 40 MG tablet Take 1 tablet by mouth once daily 90 tablet 3   Semaglutide,0.25 or 0.5MG /DOS, (OZEMPIC, 0.25 OR 0.5 MG/DOSE,) 2 MG/1.5ML SOPN Inject 0.5 mg into the skin once a week. 4.5 mL 3   tamsulosin (FLOMAX) 0.4 MG CAPS capsule Take 1 capsule by mouth once daily 30 capsule 0   temazepam (RESTORIL) 30 MG capsule Take 1 capsule by mouth at bedtime 30 capsule 0   tiZANidine (ZANAFLEX) 2 MG tablet Take 0.5-1 tablets (1-2 mg total) by mouth at bedtime as needed for muscle  spasms. 3020 tablet 1   VITAMIN D PO Take by mouth.     white petrolatum (VASELINE) GEL See admin instructions.     No facility-administered medications prior to visit.    Allergies  Allergen  Reactions   Sulfonamide Derivatives Other (See Comments)    Flu like symptoms    Zolpidem Other (See Comments)    Excessive, prolonged sedation    Review of Systems  Constitutional:  Negative for chills, fever and malaise/fatigue.  HENT:  Negative for congestion and hearing loss.   Eyes:  Negative for discharge.  Respiratory:  Negative for cough, sputum production and shortness of breath.   Cardiovascular:  Negative for chest pain, palpitations and leg swelling.  Gastrointestinal:  Negative for abdominal pain, blood in stool, constipation, diarrhea, heartburn, nausea and vomiting.  Genitourinary:  Negative for dysuria, frequency, hematuria and urgency.  Musculoskeletal:  Negative for back pain, falls and myalgias.  Skin:  Negative for rash.  Neurological:  Negative for dizziness, sensory change, loss of consciousness, weakness and headaches.  Endo/Heme/Allergies:  Negative for environmental allergies. Does not bruise/bleed easily.  Psychiatric/Behavioral:  Negative for depression and suicidal ideas. The patient is not nervous/anxious and does not have insomnia.        Objective:    Physical Exam Constitutional:      General: He is not in acute distress.    Appearance: Normal appearance. He is not ill-appearing or toxic-appearing.  HENT:     Head: Normocephalic and atraumatic.     Right Ear: Tympanic membrane and external ear normal. There is no impacted cerumen.     Left Ear: Tympanic membrane and external ear normal. There is no impacted cerumen.     Nose: Nose normal. No congestion.     Mouth/Throat:     Mouth: Mucous membranes are moist.  Eyes:     General:        Right eye: No discharge.        Left eye: No discharge.  Cardiovascular:     Rate and Rhythm: Normal rate.     Pulses: Normal pulses.  Pulmonary:     Effort: Pulmonary effort is normal.     Breath sounds: Normal breath sounds. No wheezing.  Abdominal:     Palpations: Abdomen is soft. There is mass.      Tenderness: There is no abdominal tenderness. There is no guarding.  Skin:    Findings: No rash.  Neurological:     General: No focal deficit present.     Mental Status: He is alert and oriented to person, place, and time.     Cranial Nerves: No cranial nerve deficit.  Psychiatric:        Behavior: Behavior normal.     There were no vitals taken for this visit. Wt Readings from Last 3 Encounters:  07/16/22 195 lb (88.5 kg)  06/09/22 184 lb (83.5 kg)  05/04/22 185 lb 9.6 oz (84.2 kg)    Diabetic Foot Exam - Simple   No data filed    Lab Results  Component Value Date   WBC 3.4 06/09/2022   HGB 14.3 06/09/2022   HCT 45.4 06/09/2022   PLT 196 06/09/2022   GLUCOSE 114 (H) 06/09/2022   CHOL 138 03/09/2022   TRIG 86.0 03/09/2022   HDL 39.80 03/09/2022   LDLDIRECT 84.0 05/02/2018   LDLCALC 81 03/09/2022   ALT 14 03/09/2022   AST 19 03/09/2022   NA 141 06/09/2022   K 3.9 06/09/2022  CL 100 06/09/2022   CREATININE 1.59 (H) 06/09/2022   BUN 27 06/09/2022   CO2 27 06/09/2022   TSH 1.91 03/09/2022   PSA 10.76 (H) 02/06/2019   INR 2.2 09/06/2018   HGBA1C 5.8 (A) 05/04/2022   MICROALBUR <0.7 11/03/2021    Lab Results  Component Value Date   TSH 1.91 03/09/2022   Lab Results  Component Value Date   WBC 3.4 06/09/2022   HGB 14.3 06/09/2022   HCT 45.4 06/09/2022   MCV 82 06/09/2022   PLT 196 06/09/2022   Lab Results  Component Value Date   NA 141 06/09/2022   K 3.9 06/09/2022   CO2 27 06/09/2022   GLUCOSE 114 (H) 06/09/2022   BUN 27 06/09/2022   CREATININE 1.59 (H) 06/09/2022   BILITOT 0.5 03/09/2022   ALKPHOS 51 03/09/2022   AST 19 03/09/2022   ALT 14 03/09/2022   PROT 7.5 03/09/2022   ALBUMIN 4.5 03/09/2022   CALCIUM 9.5 06/09/2022   ANIONGAP 10 11/26/2019   EGFR 47 (L) 06/09/2022   GFR 43.47 (L) 03/09/2022   Lab Results  Component Value Date   CHOL 138 03/09/2022   Lab Results  Component Value Date   HDL 39.80 03/09/2022   Lab Results   Component Value Date   LDLCALC 81 03/09/2022   Lab Results  Component Value Date   TRIG 86.0 03/09/2022   Lab Results  Component Value Date   CHOLHDL 3 03/09/2022   Lab Results  Component Value Date   HGBA1C 5.8 (A) 05/04/2022       Assessment & Plan:  Atrial fibrillation, unspecified type Assessment & Plan: Rate controlled and tolerating meds   Chronic renal impairment, unspecified CKD stage Assessment & Plan: Hydrate and monitor    Type 2 diabetes mellitus with obesity Assessment & Plan: Encouraged DASH or MIND diet, decrease po intake and increase exercise as tolerated. Needs 7-8 hours of sleep nightly. Avoid trans fats, eat small, frequent meals every 4-5 hours with lean proteins, complex carbs and healthy fats. Minimize simple carbs, high fat foods and processed foods hgba1c acceptable, continue to monitor   Preventative health care Assessment & Plan: Patient encouraged to maintain heart healthy diet, regular exercise, adequate sleep. Consider daily probiotics. Take medications as prescribed. Labs ordered and reviewed. Given and reviewed copy of ACP documents from [facility] Secretary of State and encouraged to complete and return  Colonoscopy 2023 repeat in 2028   Chronic gout without tophus, unspecified cause, unspecified site Assessment & Plan: Hydrate and monitor      Danise Edge, MD

## 2022-09-22 LAB — COMPREHENSIVE METABOLIC PANEL
ALT: 12 U/L (ref 0–53)
AST: 14 U/L (ref 0–37)
Albumin: 4.4 g/dL (ref 3.5–5.2)
Alkaline Phosphatase: 42 U/L (ref 39–117)
BUN: 24 mg/dL — ABNORMAL HIGH (ref 6–23)
CO2: 31 mEq/L (ref 19–32)
Calcium: 9.7 mg/dL (ref 8.4–10.5)
Chloride: 102 mEq/L (ref 96–112)
Creatinine, Ser: 1.53 mg/dL — ABNORMAL HIGH (ref 0.40–1.50)
GFR: 46.03 mL/min — ABNORMAL LOW (ref >60.00)
Glucose, Bld: 92 mg/dL (ref 70–99)
Potassium: 4.4 mEq/L (ref 3.5–5.1)
Sodium: 140 mEq/L (ref 135–145)
Total Bilirubin: 0.6 mg/dL (ref 0.2–1.2)
Total Protein: 7.1 g/dL (ref 6.0–8.3)

## 2022-09-22 LAB — CBC WITH DIFFERENTIAL/PLATELET
Basophils Absolute: 0.1 10*3/uL (ref 0.0–0.1)
Basophils Relative: 1.4 % (ref 0.0–3.0)
Eosinophils Absolute: 0.2 10*3/uL (ref 0.0–0.7)
Eosinophils Relative: 3.4 % (ref 0.0–5.0)
HCT: 40.8 % (ref 39.0–52.0)
Hemoglobin: 13.3 g/dL (ref 13.0–17.0)
Lymphocytes Relative: 30.8 % (ref 12.0–46.0)
Lymphs Abs: 1.4 10*3/uL (ref 0.7–4.0)
MCHC: 32.6 g/dL (ref 30.0–36.0)
MCV: 81.4 fl (ref 78.0–100.0)
Monocytes Absolute: 0.8 10*3/uL (ref 0.1–1.0)
Monocytes Relative: 17.2 % — ABNORMAL HIGH (ref 3.0–12.0)
Neutro Abs: 2.1 10*3/uL (ref 1.4–7.7)
Neutrophils Relative %: 47.2 % (ref 43.0–77.0)
Platelets: 178 10*3/uL (ref 150.0–400.0)
RBC: 5.01 Mil/uL (ref 4.22–5.81)
RDW: 16.5 % — ABNORMAL HIGH (ref 11.5–15.5)
WBC: 4.5 10*3/uL (ref 4.0–10.5)

## 2022-09-22 LAB — MICROALBUMIN / CREATININE URINE RATIO
Creatinine,U: 145.9 mg/dL
Microalb Creat Ratio: 0.9 mg/g (ref 0.0–30.0)
Microalb, Ur: 1.4 mg/dL (ref 0.0–1.9)

## 2022-09-22 LAB — LIPID PANEL
Cholesterol: 131 mg/dL (ref 0–200)
HDL: 44.9 mg/dL (ref >39.00)
LDL Cholesterol: 75 mg/dL (ref 0–99)
NonHDL: 86.37
Total CHOL/HDL Ratio: 3
Triglycerides: 58 mg/dL (ref 0.0–149.0)
VLDL: 11.6 mg/dL (ref 0.0–40.0)

## 2022-09-22 LAB — TSH: TSH: 1.2 u[IU]/mL (ref 0.35–5.50)

## 2022-09-22 LAB — URIC ACID: Uric Acid, Serum: 4.1 mg/dL (ref 4.0–7.8)

## 2022-09-22 LAB — HEMOGLOBIN A1C: Hgb A1c MFr Bld: 6.2 % (ref 4.6–6.5)

## 2022-09-23 ENCOUNTER — Other Ambulatory Visit: Payer: Self-pay

## 2022-09-23 LAB — DRUG MONITORING PANEL 376104, URINE
Alphahydroxyalprazolam: NEGATIVE ng/mL (ref <25)
Alphahydroxymidazolam: NEGATIVE ng/mL (ref <50)
Alphahydroxytriazolam: NEGATIVE ng/mL (ref <50)
Aminoclonazepam: NEGATIVE ng/mL (ref <25)
Amphetamines: NEGATIVE ng/mL (ref <500)
Barbiturates: NEGATIVE ng/mL (ref <300)
Benzodiazepines: POSITIVE ng/mL — AB (ref <100)
Cocaine Metabolite: NEGATIVE ng/mL (ref <150)
Desmethyltramadol: NEGATIVE ng/mL (ref <100)
Hydroxyethylflurazepam: NEGATIVE ng/mL (ref <50)
Lorazepam: NEGATIVE ng/mL (ref <50)
Nordiazepam: NEGATIVE ng/mL (ref <50)
Opiates: NEGATIVE ng/mL (ref <100)
Oxazepam: 979 ng/mL — ABNORMAL HIGH (ref <50)
Oxycodone: NEGATIVE ng/mL (ref <100)
Temazepam: >6250 ng/mL — ABNORMAL HIGH (ref <50)
Tramadol: NEGATIVE ng/mL (ref <100)

## 2022-09-23 LAB — DM TEMPLATE

## 2022-09-23 MED ORDER — TIZANIDINE HCL 2 MG PO TABS: 1.0000 mg | ORAL_TABLET | Freq: Every evening | ORAL | 1 refills | Status: AC | PRN

## 2022-09-23 MED ORDER — ALLOPURINOL 100 MG PO TABS: 100.0000 mg | ORAL_TABLET | Freq: Every day | ORAL | 2 refills | Status: DC

## 2022-10-10 ENCOUNTER — Other Ambulatory Visit: Payer: Self-pay | Admitting: Family Medicine

## 2022-10-12 ENCOUNTER — Other Ambulatory Visit: Payer: Self-pay | Admitting: Family Medicine

## 2022-10-13 ENCOUNTER — Telehealth: Payer: Self-pay | Admitting: Family Medicine

## 2022-10-13 ENCOUNTER — Other Ambulatory Visit: Payer: Self-pay | Admitting: Family Medicine

## 2022-10-13 ENCOUNTER — Telehealth: Payer: Self-pay

## 2022-10-13 MED ORDER — TEMAZEPAM 30 MG PO CAPS: 30.0000 mg | ORAL_CAPSULE | Freq: Every day | ORAL | 3 refills | Status: DC

## 2022-10-13 NOTE — Telephone Encounter (Signed)
Pt called to follow up on issue. After reviewing chart and speaking with Selena Batten, concluded that the Rx was printed and faxed over to the pharmacy instead of e-scribed and that it may have gotten lost in transmission. Advised pt that a CMA would be verbally calling in the medication to his pharmacy shortly and to follow up with them on refill status.

## 2022-10-13 NOTE — Telephone Encounter (Signed)
Pt called to follow up on his temazepam (RESTORIL) 30 MG capsule. He said the pharmacy advised it has been delayed for some reason and He is completely out now. Please review prescription to see what the issue may be and advise pt.

## 2022-10-13 NOTE — Telephone Encounter (Signed)
Called pt was advised request has been sent to  [facility].

## 2022-10-20 ENCOUNTER — Other Ambulatory Visit: Payer: Self-pay | Admitting: Family Medicine

## 2022-10-29 ENCOUNTER — Other Ambulatory Visit: Payer: Medicare HMO

## 2022-11-02 ENCOUNTER — Telehealth: Payer: Self-pay | Admitting: Pharmacist

## 2022-11-02 ENCOUNTER — Ambulatory Visit: Payer: Medicare HMO | Admitting: Internal Medicine

## 2022-11-02 ENCOUNTER — Encounter: Payer: Self-pay | Admitting: Internal Medicine

## 2022-11-02 VITALS — BP 120/70 | HR 77 | Ht 71.0 in | Wt 183.4 lb

## 2022-11-02 DIAGNOSIS — G63 Polyneuropathy in diseases classified elsewhere: Secondary | ICD-10-CM | POA: Diagnosis not present

## 2022-11-02 DIAGNOSIS — E1159 Type 2 diabetes mellitus with other circulatory complications: Secondary | ICD-10-CM | POA: Diagnosis not present

## 2022-11-02 DIAGNOSIS — E782 Mixed hyperlipidemia: Secondary | ICD-10-CM | POA: Diagnosis not present

## 2022-11-02 DIAGNOSIS — Z7985 Long-term (current) use of injectable non-insulin antidiabetic drugs: Secondary | ICD-10-CM

## 2022-11-02 DIAGNOSIS — E663 Overweight: Secondary | ICD-10-CM

## 2022-11-02 DIAGNOSIS — Z7984 Long term (current) use of oral hypoglycemic drugs: Secondary | ICD-10-CM

## 2022-11-02 DIAGNOSIS — E119 Type 2 diabetes mellitus without complications: Secondary | ICD-10-CM

## 2022-11-02 DIAGNOSIS — E1129 Type 2 diabetes mellitus with other diabetic kidney complication: Secondary | ICD-10-CM | POA: Insufficient documentation

## 2022-11-02 NOTE — Telephone Encounter (Signed)
Pharmacy Quality Measure Review  This patient is appearing on the insurance-provided list for being at risk of failing the adherence measure for diabetes medications this calendar year.   Medication: Marcelline Deist 10mg  daily  Last fill date: 09/06/2022  Call patient to discuss [place]. He states that he was approved to received [place] thru medication assistance program [place] and [place] for the rest of 2024.  He is also getting Ozempic thru [employer] medication assistance program.   Henrene Pastor, PharmD Clinical Pharmacist [facility]

## 2022-11-02 NOTE — Patient Instructions (Signed)
Please continue: - Farxiga 10 mg daily - Ozempic 0.5 mg weekly  Please come back for a follow-up appointment in 6 months. 

## 2022-11-02 NOTE — Progress Notes (Signed)
Patient ID: [name], male   DOB: 1951/04/15, 72 y.o.   MRN: [id number]  HPI: [name] is a 72 y.o.-year-old male, initially referred by his cardiologist, Dr. Royann Shivers, returning for follow-up DM2, dx in 2011, non-insulin-dependent, uncontrolled, with complications (CHF, CKD, ED, DR).  Last visit 6 months ago.  Interim hx: No increased urination, blurry vision, nausea, chest pain. He is doing intermittent fasting - 18-6 - eating window in the afternoon. He twisted his knee 1 mo ago  - was less active.   Reviewed HbA1c levels Lab Results  Component Value Date   HGBA1C 6.2 09/21/2022   HGBA1C 5.8 (A) 05/04/2022   HGBA1C 5.8 (A) 10/29/2021   HGBA1C 5.9 (A) 06/30/2021   HGBA1C 8.2 (H) 03/06/2021   HGBA1C 7.4 (A) 02/24/2021   HGBA1C 6.9 (A) 10/21/2020   HGBA1C 6.3 (A) 07/22/2020   HGBA1C 8.1 (A) 04/18/2020   HGBA1C 6.6 (A) 12/14/2019   HGBA1C 5.3 09/14/2019   HGBA1C 6.4 06/11/2019   HGBA1C 5.8 02/06/2019   HGBA1C 5.7 08/03/2018   HGBA1C 7.0 (H) 05/02/2018   HGBA1C 6.7 (H) 01/24/2018   HGBA1C 6.2 10/21/2017   HGBA1C 7.2 (H) 03/18/2017   HGBA1C 7.0 (H) 11/15/2016   HGBA1C 7.5 (H) 08/20/2016   HGBA1C 6.8 (H) 05/21/2016   HGBA1C 6.7 (H) 02/18/2016   HGBA1C 6.4 11/17/2015   HGBA1C 6.4 07/15/2015   HGBA1C 6.6 (H) 04/14/2015   HGBA1C 6.1 01/07/2015   HGBA1C 6.8 (H) 09/13/2014   HGBA1C 6.8 (H) 06/13/2014   HGBA1C 6.6 (H) 01/09/2014   HGBA1C 6.4 (H) 10/08/2013   Pt is on a regimen of: - Farxiga 10 mg daily-started by Dr. Gala Romney - Ozempic 0.25 >> 0.5 mg weekly - started 03/29/2021 - through PAP Stopped Glyburide 02/2021.  He was not able to start Ozempic 0.5 mg- 04/2020 - due to price. He was on Metformin in the past.  Pt checks his sugars once a day: - am:144-171 >> 104-128, 134 >> 114-127, 141 >> 101-105 >> 116, 123 - 2h after b'fast: n/c  - before lunch: n/c >> 99-126 >> 101-140 - 2h after lunch: n/c >> 165 >> n/c >> 224>> 93-124  >> n/c >> 146 - before  dinner: 81-133, 235 >> 178-227 >> n/c >> 91-113 >> n/c - 2h after dinner: 109, 122, 144 >> n/c >> 107-161 >> 94 >> n/c - bedtime: 68-155, 176, 376  >> 154, 157, 200 >> n/c >> 105-119 >> 97-152 - nighttime: n/c>> 128 >> n/c Lowest sugar was 60 >> .[name]. 104 >> 93 >> 91 >> 92; he has hypoglycemia awareness in the 50s.  Highest sugar was 376 (cheese cake) >>...  227 >> 160s >> 142 >> 129 >> 152  Glucometer: One Touch verio IQ  Pt's meals are: - Breakfast: oatmeal + berries/apples + cinnamon, cereal, fruit - Lunch: light meal - Dinner:  + veggies, occasionally fried foods (1x a mo) - Snacks:juice >> fruit  -+ CKD-sees Dr. Wynelle Link, last BUN/creatinine:  Lab Results  Component Value Date   BUN 24 (H) 09/21/2022   BUN 27 06/09/2022   CREATININE 1.53 (H) 09/21/2022   CREATININE 1.59 (H) 06/09/2022   Lab Results  Component Value Date   GFRAA 42 (L) 05/20/2020   GFRAA 38 (L) 02/25/2020   GFRAA 36 (L) 11/26/2019   GFRAA 39 (L) 11/12/2019   GFRAA 36 (L) 09/18/2019   GFRAA 37 (L) 08/30/2019   GFRAA 39 (L) 08/14/2019   GFRAA 45 (L) 07/11/2019  GFRAA 51 (L) 05/29/2019   GFRAA 58 (L) 05/28/2019  He is not on ACE inhibitor/ARB.  -+ HL; last set of lipids: Lab Results  Component Value Date   CHOL 131 09/21/2022   HDL 44.90 09/21/2022   LDLCALC 75 09/21/2022   LDLDIRECT 84.0 05/02/2018   TRIG 58.0 09/21/2022   CHOLHDL 3 09/21/2022  On pravastatin 40, omega-3 fatty acids.  - last eye exam was 03/2022: + DR reportedly. Also glaucoma. Dr. Sherryll Burger.  -+ numbness and tingling in his feet.Dr. Royann Shivers started him on Gabapentin 100 mg daily.  Currently on 100-200 mg daily.  Last foot exam 01/15/2022 (Dr. Eloy End)  Pt has FH of DM in mother, sister.  He is on amiodarone.  No FH of MTC. No personal hx of pancreatitis.  ROS: + see HPI  I reviewed pt's medications, allergies, PMH, social hx, family hx, and changes were documented in the history of present illness. Otherwise, unchanged from my  initial visit note.  Past Medical History:  Diagnosis Date   AORTIC STENOSIS, MODERATE 12/05/2009   Arthritis    Atrial fibrillation (HCC)    Atrial fibrillation with RVR (HCC) 07/05/2017   Atrial flutter (HCC) 12/05/2009   Atypical chest pain 11/08/2011   BENIGN PROSTATIC HYPERTROPHY, HX OF 12/05/2009   CHEST PAIN-UNSPECIFIED 11/11/2009   CHF 12/05/2009   Chronic renal insufficiency 12/16/2016   Debility 04/18/2012   Diarrhea 12/16/2016   DYSPNEA 12/19/2009   Edema 04/18/2012   ERECTILE DYSFUNCTION 01/25/2007   FATIGUE, CHRONIC 11/11/2009   Gout 07/10/2012   Heart murmur    History of kidney stones    "they passed" (07/05/2017)   Hyperkalemia 04/18/2012   Hyperlipidemia    Hypertension    HYPERTENSION 01/25/2007   Hypoxia 05/05/2011   Insomnia 05/13/2016   INSOMNIA, HX OF 01/25/2007   Knee pain, bilateral 12/28/2010   Knee pain, right 12/28/2010   NEUROMA 01/05/2010   OBESITY NOS 01/25/2007   OSA on CPAP 04/06/2010   PERIPHERAL NEUROPATHY 01/05/2010   PULMONARY FUNCTION TESTS, ABNORMAL 02/02/2010   Sleep apnea    wears CPAP   Superficial thrombophlebitis of left leg 09/07/2011   TESTICULAR HYPOFUNCTION 01/05/2010   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   Toe pain, right 07/10/2012   Type II diabetes mellitus (HCC) 12/05/2009   UNSPECIFIED ANEMIA 01/05/2010   Past Surgical History:  Procedure Laterality Date   A-FLUTTER ABLATION N/A 05/16/2019   Procedure: A-FLUTTER ABLATION;  Surgeon: Regan Lemming, MD;  Location: [facility] INVASIVE CV LAB;  Service: Cardiovascular;  Laterality: N/A;   ATRIAL FIBRILLATION ABLATION N/A 05/16/2019   Procedure: ATRIAL FIBRILLATION ABLATION;  Surgeon: Regan Lemming, MD;  Location: [facility];  Service: Cardiovascular;  Laterality: N/A;   ATRIAL FIBRILLATION ABLATION N/A 03/10/2020   Procedure: ATRIAL FIBRILLATION ABLATION;  Surgeon: Regan Lemming, MD;  Location: [facility];  Service: Cardiovascular;  Laterality:  N/A;   CARDIAC CATHETERIZATION  10/16/2009   nonischemic cardiomyopathy   CARDIOVERSION N/A 03/25/2015   Procedure: CARDIOVERSION;  Surgeon: Chrystie Nose, MD;  Location: [facility] ENDOSCOPY;  Service: Cardiovascular;  Laterality: N/A;   CARDIOVERSION N/A 06/17/2017   Procedure: CARDIOVERSION;  Surgeon: Thurmon Fair, MD;  Location: [facility] ENDOSCOPY;  Service: Cardiovascular;  Laterality: N/A;   CARDIOVERSION N/A 07/08/2017   Procedure: CARDIOVERSION;  Surgeon: Pricilla Riffle, MD;  Location: [facility] ENDOSCOPY;  Service: Cardiovascular;  Laterality: N/A;   CARDIOVERSION N/A 02/27/2019   Procedure: CARDIOVERSION;  Surgeon: Thurmon Fair, MD;  Location: [facility] ENDOSCOPY;  Service: Cardiovascular;  Laterality: N/A;   CARDIOVERSION N/A 08/16/2019   Procedure: CARDIOVERSION;  Surgeon: Thurmon Fair, MD;  Location: [facility] ENDOSCOPY;  Service: Cardiovascular;  Laterality: N/A;   COLONOSCOPY  03/10/2021   1st colonoscopy , Amada Jupiter, [facility], large transverse polyp, clip and spot applied   METATARSAL OSTEOTOMY Bilateral ~ 1980   removed part of 5th metatarsal to corect curvature of toes    RIGHT HEART CATH N/A 05/16/2019   Procedure: RIGHT HEART CATH;  Surgeon: Dolores Patty, MD;  Location: [facility];  Service: Cardiovascular;  Laterality: N/A;   TEE WITHOUT CARDIOVERSION  05/16/2019   Procedure: Transesophageal Echocardiogram (Tee);  Surgeon: Regan Lemming, MD;  Location: [facility] INVASIVE CV LAB;  Service: Cardiovascular;;   TEE WITHOUT CARDIOVERSION N/A 03/07/2020   Procedure: TRANSESOPHAGEAL ECHOCARDIOGRAM (TEE);  Surgeon: Meriam Sprague, MD;  Location: [facility] ENDOSCOPY;  Service: Cardiovascular;  Laterality: N/A;   Social History   Socioeconomic History   Marital status: Married    Spouse name: Not on file   Number of children: 4   Years of education: 12   Highest education level: High school graduate  Occupational History   Occupation: retired    Associate Professor: [employer]  Tobacco Use    Smoking status: Former    Packs/day: 1.00    Years: 10.00    Additional pack years: 0.00    Total pack years: 10.00    Types: Cigarettes    Quit date: 06/07/1980    Years since quitting: 42.4   Smokeless tobacco: Never  Vaping Use   Vaping Use: Never used  Substance and Sexual Activity   Alcohol use: No   Drug use: No   Sexual activity: Not Currently  Other Topics Concern   Not on file  Social History Narrative   Lives with male partner in a one story home.  His son lives there off and on.  Retired from [employer].  Education: high school.  Right handed   Social Determinants of Health   Financial Resource Strain: Not on file  Food Insecurity: Not on file  Transportation Needs: Not on file  Physical Activity: Not on file  Stress: Stress Concern Present (04/05/2022)   [employer] of Occupational Health - Occupational Stress Questionnaire    Feeling of Stress : To some extent  Social Connections: Not on file  Intimate Partner Violence: Not on file   Current Outpatient Medications on File Prior to Visit  Medication Sig Dispense Refill   Alcohol Swabs (DROPSAFE ALCOHOL PREP) 70 % PADS USE AS DIRECTED 300 each 3   allopurinol (ZYLOPRIM) 100 MG tablet Take 1 tablet (100 mg total) by mouth daily. 30 tablet 2   Accu-Chek Softclix Lancets lancets Use as instructed 200 each 3   albuterol (VENTOLIN HFA) 108 (90 Base) MCG/ACT inhaler Inhale 2 puffs into the lungs every 6 (six) hours as needed for wheezing or shortness of breath. 1 Inhaler 6   apixaban (ELIQUIS) 5 MG TABS tablet Take 1 tablet (5 mg total) by mouth 2 (two) times daily. 60 tablet 5   Blood Glucose Monitoring Suppl (ACCU-CHEK GUIDE ME) w/Device KIT USE TO TEST BLOOD SUGAR ONCE TO TWICE DAILY 1 kit 0   calcium carbonate (TUMS - DOSED IN MG ELEMENTAL CALCIUM) 500 MG chewable tablet Chew 500 mg by mouth daily as needed for indigestion or heartburn.     cetirizine (ZYRTEC) 10 MG tablet Take 1 tablet (10 mg total) by mouth  daily. 30 tablet  11   clobetasol ointment (TEMOVATE) 0.05 % 1 application to affected area     Clobetasol Prop Emollient Base (CLOBETASOL PROPIONATE E) 0.05 % emollient cream Apply 1 application topically 2 (two) times daily as needed (insect bite). 60 g 1   colchicine (COLCRYS) 0.6 MG tablet Take one tablet for 4-6 days as needed for gout flare up. 15 tablet 3   dapagliflozin propanediol (FARXIGA) 10 MG TABS tablet Take 1 tablet (10 mg total) by mouth daily. 30 tablet 3   fish oil-omega-3 fatty acids 1000 MG capsule Take 1 g by mouth daily.     FLUZONE HIGH-DOSE QUADRIVALENT 0.7 ML SUSY      furosemide (LASIX) 80 MG tablet Take 1 tablet (80 mg total) by mouth daily. As needed patient may take an additional 40 MG tablet as direct per [employer] study PRN plan. 90 tablet 1   gabapentin (NEURONTIN) 100 MG capsule Take 2 capsules (200 mg total) by mouth at bedtime. 180 capsule 3   glucose blood (ACCU-CHEK GUIDE) test strip USE TO TEST BLOOD SUGAR ONCE TO TWICE DAILY 200 each 1   hydrALAZINE (APRESOLINE) 25 MG tablet TAKE 1 TABLET BY MOUTH THREE TIMES DAILY 270 tablet 1   HYDROcodone-acetaminophen (NORCO) 10-325 MG tablet Take 1 tablet by mouth every 8 (eight) hours as needed for moderate pain or severe pain. 90 tablet 0   hydrocortisone 2.5 % ointment Apply topically 2 (two) times daily as needed. 30 g 1   isosorbide mononitrate (IMDUR) 30 MG 24 hr tablet Take 1 tablet by mouth once daily 90 tablet 0   LORazepam (ATIVAN) 1 MG tablet TAKE 1 TABLET BY MOUTH EVERY 8 HOURS AS NEEDED FOR ANXIETY 30 tablet 0   magnesium oxide (MAG-OX) 400 MG tablet Take 1 tablet (400 mg total) by mouth 2 (two) times daily. 30 tablet 11   metoprolol succinate (TOPROL-XL) 50 MG 24 hr tablet Take 1 tablet (50 mg total) by mouth in the morning and at bedtime. WITH OR FOLLOWING A MEAL 180 tablet 3   Multiple Vitamin (MULTIVITAMIN WITH MINERALS) TABS tablet Take 1 tablet by mouth daily.      Polyethyl Glycol-Propyl Glycol  (SYSTANE OP) Place 1 drop into both eyes daily as needed (dry eyes).     potassium chloride SA (KLOR-CON M20) 20 MEQ tablet Take 1 tablet (20 mEq total) by mouth as directed. 1 tablet daily 20 mEq by mouth as directed per alleviate research protocol. 90 tablet 3   pravastatin (PRAVACHOL) 40 MG tablet Take 1 tablet by mouth once daily 90 tablet 3   Semaglutide,0.25 or 0.5MG /DOS, (OZEMPIC, 0.25 OR 0.5 MG/DOSE,) 2 MG/1.5ML SOPN Inject 0.5 mg into the skin once a week. 4.5 mL 3   tamsulosin (FLOMAX) 0.4 MG CAPS capsule Take 1 capsule by mouth once daily 30 capsule 0   temazepam (RESTORIL) 30 MG capsule Take 1 capsule (30 mg total) by mouth at bedtime. 30 capsule 3   tiZANidine (ZANAFLEX) 2 MG tablet Take 0.5-1 tablets (1-2 mg total) by mouth at bedtime as needed for muscle spasms. 30 tablet 1   VITAMIN D PO Take by mouth.     white petrolatum (VASELINE) GEL See admin instructions.     No current facility-administered medications on file prior to visit.   Allergies  Allergen Reactions   Sulfonamide Derivatives Other (See Comments)    Flu like symptoms    Zolpidem Other (See Comments)    Excessive, prolonged sedation   Family History  Problem  Relation Age of Onset   Clotting disorder Mother    Heart disease Mother        s/p MI   Heart attack Mother    Hypertension Mother    Diabetes Mother    Hyperlipidemia Mother    Stroke Mother    Cancer Father        ? lung   Lung disease Father        smoker   Diabetes Sister    Hypertension Sister        smoker   Aneurysm Sister        brain   Other Sister        clipped   Seizures Sister        d/o w/aneurysm/ smoker   Leukemia Maternal Grandmother        ?   Colon cancer Neg Hx    Colon polyps Neg Hx    Stomach cancer Neg Hx    Rectal cancer Neg Hx    Esophageal cancer Neg Hx    PE: BP 120/70 (BP Location: Right Arm, Patient Position: Sitting, Cuff Size: Normal)   Pulse 77   Ht 5\' 11"  (1.803 m)   Wt 183 lb 6.4 oz (83.2 kg)    SpO2 99%   BMI 25.58 kg/m  Wt Readings from Last 3 Encounters:  11/02/22 183 lb 6.4 oz (83.2 kg)  09/21/22 183 lb 3.2 oz (83.1 kg)  07/16/22 195 lb (88.5 kg)   Constitutional: overweight, in NAD Eyes:  EOMI, no exophthalmos ENT: no neck masses, no cervical lymphadenopathy Cardiovascular: RRR, No MRG Respiratory: CTA B Musculoskeletal: no deformities Skin:no rashes Neurological: no tremor with outstretched hands  ASSESSMENT: 1. DM2, non-insulin-dependent, uncontrolled, with long-term complications - s and d CHF - A fib - s/p cardioversion - 05/2019 >> went into cardiac shock - CKD - sees nephrology - DR - ED  2.  Overweight  3. HL  4.  Peripheral neuropathy  PLAN:  1. Patient with longstanding, previously uncontrolled type 2 diabetes, with significant improvement in control on a regimen of SGLT2 inhibitor and weekly GLP-1 receptor agonist.  We were able to stop sulfonylurea only adding the GLP-1 receptor agonist.  He obtains [place] and Ozempic through the patient assistance program.  He continues to stay active and is on a mostly whole food plant-based diet, with intermittent fasting.  At last visit, sugars were at goal so we did not change his regimen.  I gave him a reference book for intermittent fasting ("The Obesity Code") as he mentioned that he enjoyed the previous references that I suggested about a plant-based diet. -At today's visit, he continues with his diet.  Sugars have remained well-controlled, without hyperglycemic spikes.  We can continue the current regimen.  He is due for the PAP paperwork for Novo -this was filled out today and patient to get home to fill out his part and then bring it back.   - I suggested to:  Patient Instructions  Please continue: - Farxiga 10 mg daily - Ozempic 0.5 mg weekly  Please come back for a follow-up appointment in 6 months.  - advised to check sugars at different times of the day - 1x a day, rotating check times - advised for  yearly eye exams >> he is UTD - return to clinic in 6 months  2.  Overweight -continue SGLT 2 inhibitor and GLP-1 receptor agonist which should also help with weight loss -Before last visit he gained 1 pound,  previously lost 9 with intermittent fasting -He lost 2 lb since last OV  3. HL -Reviewed lipid panel from 09/2022: Fractions at goal with the exception of an LDL above our goal of less than 55 due to history of cardiovascular disease: Lab Results  Component Value Date   CHOL 131 09/21/2022   HDL 44.90 09/21/2022   LDLCALC 75 09/21/2022   LDLDIRECT 84.0 05/02/2018   TRIG 58.0 09/21/2022   CHOLHDL 3 09/21/2022  -He is pravastatin 40 mg daily and omega-3 fatty acids-without side effects  4.  Peripheral neuropathy -Most likely related to diabetes -On gabapentin 100 to 200 mg at bedtime, initially added by Dr. Royann Shivers -He continues to have numbness and tingling -He has diabetic shoes  Carlus Pavlov, MD PhD [facility] Endocrinology

## 2022-11-03 DIAGNOSIS — R972 Elevated prostate specific antigen [PSA]: Secondary | ICD-10-CM | POA: Diagnosis not present

## 2022-11-05 ENCOUNTER — Telehealth: Payer: Self-pay | Admitting: Internal Medicine

## 2022-11-05 NOTE — Telephone Encounter (Signed)
Patient dropped off completed patient assistance paperwork - placed in providers folder at the front

## 2022-11-08 ENCOUNTER — Other Ambulatory Visit: Payer: Medicare HMO

## 2022-11-09 ENCOUNTER — Ambulatory Visit (INDEPENDENT_AMBULATORY_CARE_PROVIDER_SITE_OTHER): Payer: Medicare HMO | Admitting: Podiatry

## 2022-11-09 DIAGNOSIS — E1142 Type 2 diabetes mellitus with diabetic polyneuropathy: Secondary | ICD-10-CM

## 2022-11-09 NOTE — Progress Notes (Signed)
Diabetic shoes were dispensed and they are functioning well.  They are fitting well

## 2022-11-10 ENCOUNTER — Other Ambulatory Visit: Payer: Self-pay | Admitting: Urology

## 2022-11-10 DIAGNOSIS — N402 Nodular prostate without lower urinary tract symptoms: Secondary | ICD-10-CM

## 2022-11-10 DIAGNOSIS — R351 Nocturia: Secondary | ICD-10-CM | POA: Diagnosis not present

## 2022-11-10 DIAGNOSIS — R972 Elevated prostate specific antigen [PSA]: Secondary | ICD-10-CM

## 2022-11-10 DIAGNOSIS — N403 Nodular prostate with lower urinary tract symptoms: Secondary | ICD-10-CM | POA: Diagnosis not present

## 2022-11-11 DIAGNOSIS — M25561 Pain in right knee: Secondary | ICD-10-CM | POA: Diagnosis not present

## 2022-11-26 ENCOUNTER — Encounter: Payer: Self-pay | Admitting: Podiatry

## 2022-11-26 ENCOUNTER — Ambulatory Visit: Payer: Medicare HMO | Admitting: Podiatry

## 2022-11-26 VITALS — BP 99/62 | HR 83

## 2022-11-26 DIAGNOSIS — M79675 Pain in left toe(s): Secondary | ICD-10-CM

## 2022-11-26 DIAGNOSIS — B351 Tinea unguium: Secondary | ICD-10-CM | POA: Diagnosis not present

## 2022-11-26 DIAGNOSIS — M79674 Pain in right toe(s): Secondary | ICD-10-CM

## 2022-11-26 NOTE — Progress Notes (Signed)
   Chief Complaint  Patient presents with   Nail Problem    "It's a routine trim."    SUBJECTIVE Patient with a history of diabetes mellitus presents to office today complaining of elongated, thickened nails that cause pain while ambulating in shoes.  Patient is unable to trim their own nails. Patient is here for further evaluation and treatment.  Past Medical History:  Diagnosis Date   AORTIC STENOSIS, MODERATE 12/05/2009   Arthritis    Atrial fibrillation (HCC)    Atrial fibrillation with RVR (HCC) 07/05/2017   Atrial flutter (HCC) 12/05/2009   Atypical chest pain 11/08/2011   BENIGN PROSTATIC HYPERTROPHY, HX OF 12/05/2009   CHEST PAIN-UNSPECIFIED 11/11/2009   CHF 12/05/2009   Chronic renal insufficiency 12/16/2016   Debility 04/18/2012   Diarrhea 12/16/2016   DYSPNEA 12/19/2009   Edema 04/18/2012   ERECTILE DYSFUNCTION 01/25/2007   FATIGUE, CHRONIC 11/11/2009   Gout 07/10/2012   Heart murmur    History of kidney stones    "they passed" (07/05/2017)   Hyperkalemia 04/18/2012   Hyperlipidemia    Hypertension    HYPERTENSION 01/25/2007   Hypoxia 05/05/2011   Insomnia 05/13/2016   INSOMNIA, HX OF 01/25/2007   Knee pain, bilateral 12/28/2010   Knee pain, right 12/28/2010   NEUROMA 01/05/2010   OBESITY NOS 01/25/2007   OSA on CPAP 04/06/2010   PERIPHERAL NEUROPATHY 01/05/2010   PULMONARY FUNCTION TESTS, ABNORMAL 02/02/2010   Sleep apnea    wears CPAP   Superficial thrombophlebitis of left leg 09/07/2011   TESTICULAR HYPOFUNCTION 01/05/2010   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 Derm Skin is dry and supple  bilateral. Negative open lesions or macerations. Remaining integument unremarkable. Nails are tender, long, thickened and dystrophic with subungual debris, consistent with onychomycosis, 1-5 bilateral. No signs of infection noted. Vasc  DP and PT pedal pulses palpable bilaterally. Temperature gradient within normal limits.  Neuro Epicritic and protective threshold sensation diminished bilaterally.  Musculoskeletal Exam No symptomatic pedal deformities noted bilateral. Muscular strength within normal limits.  ASSESSMENT 1. Diabetes Mellitus w/ peripheral neuropathy 2.  Pain due to onychomycosis of toenails bilateral  PLAN OF CARE 1. Patient evaluated today. 2. Instructed to maintain good pedal hygiene and foot care. Stressed importance of controlling blood sugar.  3. Mechanical debridement of nails 1-5 bilaterally performed using a nail nipper. Filed with dremel without incident.  4. Return to clinic in 3 mos.     Felecia Shelling, DPM [facility]  Dr. Felecia Shelling, DPM    [address] [place], [place] [address]                Office [phone]  Fax [phone]

## 2022-12-12 ENCOUNTER — Other Ambulatory Visit (HOSPITAL_COMMUNITY): Payer: Self-pay | Admitting: Internal Medicine

## 2022-12-12 ENCOUNTER — Other Ambulatory Visit: Payer: Self-pay | Admitting: Cardiology

## 2022-12-15 ENCOUNTER — Telehealth: Payer: Self-pay

## 2022-12-15 NOTE — Telephone Encounter (Signed)
Patient Assistance  Medication: Ozempic Dosage: 0.5 mg Quantity: 5 boxes  Ashish Rossetti,RMA

## 2022-12-16 ENCOUNTER — Other Ambulatory Visit: Payer: Self-pay | Admitting: Family Medicine

## 2022-12-16 NOTE — Telephone Encounter (Signed)
Requesting: lorazepam 1mg   Contract: 09/21/22 UDS: 09/21/22 Last Visit: 09/21/22 Next Visit: 01/20/23 Last Refill: 07/19/22 #30 and 0RF  Please Advise

## 2022-12-16 NOTE — Telephone Encounter (Signed)
Patient picked up patient assistance - 5 boxes Ozempic log noted

## 2022-12-17 ENCOUNTER — Telehealth: Payer: Self-pay | Admitting: Cardiovascular Disease

## 2022-12-17 DIAGNOSIS — Z006 Encounter for examination for normal comparison and control in clinical research program: Secondary | ICD-10-CM

## 2022-12-17 MED ORDER — POTASSIUM CHLORIDE CRYS ER 20 MEQ PO TBCR
EXTENDED_RELEASE_TABLET | ORAL | 3 refills | Status: DC
Start: 2022-12-17 — End: 2023-12-20

## 2022-12-17 NOTE — Telephone Encounter (Signed)
*  STAT* If patient is at the pharmacy, call can be transferred to refill team.   1. Which medications need to be refilled? (please list name of each medication and dose if known)   potassium chloride SA (KLOR-CON M20) 20 MEQ tablet (Expired)   2. Which pharmacy/location (including street and city if local pharmacy) is medication to be sent to?  [facility] [address] [place] - [address]   3. Do they need a 30 day or 90 day supply?   90 day  Patient stated he is completely out of this medication.  Patient has appointment scheduled on 8/12.

## 2022-12-19 ENCOUNTER — Other Ambulatory Visit: Payer: Self-pay | Admitting: Family Medicine

## 2022-12-23 DIAGNOSIS — I5042 Chronic combined systolic (congestive) and diastolic (congestive) heart failure: Secondary | ICD-10-CM | POA: Diagnosis not present

## 2022-12-23 DIAGNOSIS — I10 Essential (primary) hypertension: Secondary | ICD-10-CM | POA: Diagnosis not present

## 2022-12-23 DIAGNOSIS — I129 Hypertensive chronic kidney disease with stage 1 through stage 4 chronic kidney disease, or unspecified chronic kidney disease: Secondary | ICD-10-CM | POA: Diagnosis not present

## 2022-12-27 ENCOUNTER — Ambulatory Visit: Payer: Medicare HMO | Admitting: Adult Health

## 2022-12-27 ENCOUNTER — Encounter: Payer: Self-pay | Admitting: Adult Health

## 2022-12-27 VITALS — BP 100/60 | HR 89 | Ht 71.0 in | Wt 185.0 lb

## 2022-12-27 DIAGNOSIS — G4700 Insomnia, unspecified: Secondary | ICD-10-CM | POA: Diagnosis not present

## 2022-12-27 DIAGNOSIS — G4733 Obstructive sleep apnea (adult) (pediatric): Secondary | ICD-10-CM | POA: Diagnosis not present

## 2022-12-27 NOTE — Patient Instructions (Addendum)
Continue on BIPAP At bedtime. Keep up good work.  Goal is to wear for at least 6 -8 hrs each night.  Do not drive if sleepy.  Work on healthy weight.  Healthy sleep regimen .  Follow up with Dr. Vassie Loll or Izan Miron in 1 year   and As needed   Please contact office for sooner follow up if symptoms do not improve or worsen or seek emergency care

## 2022-12-27 NOTE — Progress Notes (Signed)
@Patient  ID: [name], male    DOB: 03/01/1951, 72 y.o.   MRN: [id number]  Chief Complaint  Patient presents with   Follow-up    Referring provider: Bradd Canary, MD  HPI: 72 year old male followed for obstructive sleep apnea and insomnia Medical history significant for nonischemic cardiomyopathy, atrial flutter, congestive heart failure, history of cardioversion and ablation  TEST/EVENTS :  2D echo July 20, 2019 EF 35 to 40%   PSG dec '11>> - weighed 225 pounds,BMI of 31, neck size of 14 inches Sleep was very fragmented due to frequent arousals and awakenings, especially in the second half of the night with CPAP. He was desensitized with a standard nasal mask. Note that he took 2 tablets of temazepam of 15 mg prior to the study. >>AHI was 42/h with lowest desatn of 86 % (no centrals noted on original study  >>corrected by CPAP of 8 cm with a nasal mask. Download 12/28 -07/02/10 >> good compliance avg 6.5h, AHI 15/h, centrals 9/h, obstructive 4.5/h, avg pr 8 cm Download on 10 cm 2/28-3/28/12 >> few residual events, some central apneas persist   PFT -> 01/2010 - TLC 77%, DLCO 56%, FVC 3.5L/72%, RAtio 77 ,FEV 1 78%   12/27/2022 Follow up ; obstructive sleep apnea and insomnia Patient returns for follow-up visit.  Last seen July 2022.  Patient has underlying sleep apnea is on nocturnal BiPAP.  Patient says he is doing very well on BiPAP.  Cannot sleep without it.  Feels that he benefits from BiPAP with decreased daytime sleepiness.  BiPAP download shows excellent compliance with daily average usage at 8 hours.  Patient is on auto BiPAP with IPAP max 16 and EPAP minimum at 12.  AHI 6.7/hour. Uses full face mask.  , [employer] [facility].  Remains very active. Feeling good. Volunteers at [employer].   Patient does have chronic insomnia.  He uses temazepam at bedtime.  Says overall sleeps been doing fairly well.  Gets refills through primary care.  Follows with Cardiology. Says he has been doing  well. No increased leg swelling.   Allergies  Allergen Reactions   Sulfonamide Derivatives Other (See Comments)    Flu like symptoms    Zolpidem Other (See Comments)    Excessive, prolonged sedation    Immunization History  Administered Date(s) Administered   Fluad Quad(high Dose 65+) 05/22/2020   Influenza Split 03/03/2011, 03/10/2012, 04/07/2018   Influenza Whole 03/28/2008, 03/30/2010   Influenza,inj,Quad PF,6+ Mos 01/31/2013, 03/07/2014, 04/14/2015, 02/19/2016, 03/18/2017   Influenza-Unspecified 04/05/2019, 05/15/2021   PFIZER(Purple Top)SARS-COV-2 Vaccination 08/03/2019, 08/21/2019, 04/18/2020, 09/18/2020   PNEUMOCOCCAL CONJUGATE-20 07/07/2021   Pfizer Covid-19 Vaccine Bivalent Booster 8yrs & up 05/20/2021   Pneumococcal Polysaccharide-23 10/07/2009   Td 09/05/2009    Past Medical History:  Diagnosis Date   AORTIC STENOSIS, MODERATE 12/05/2009   Arthritis    Atrial fibrillation (HCC)    Atrial fibrillation with RVR (HCC) 07/05/2017   Atrial flutter (HCC) 12/05/2009   Atypical chest pain 11/08/2011   BENIGN PROSTATIC HYPERTROPHY, HX OF 12/05/2009   CHEST PAIN-UNSPECIFIED 11/11/2009   CHF 12/05/2009   Chronic renal insufficiency 12/16/2016   Debility 04/18/2012   Diarrhea 12/16/2016   DYSPNEA 12/19/2009   Edema 04/18/2012   ERECTILE DYSFUNCTION 01/25/2007   FATIGUE, CHRONIC 11/11/2009   Gout 07/10/2012   Heart murmur    History of kidney stones    "they passed" (07/05/2017)   Hyperkalemia 04/18/2012   Hyperlipidemia    Hypertension    HYPERTENSION 01/25/2007  Hypoxia 05/05/2011   Insomnia 05/13/2016   INSOMNIA, HX OF 01/25/2007   Knee pain, bilateral 12/28/2010   Knee pain, right 12/28/2010   NEUROMA 01/05/2010   OBESITY NOS 01/25/2007   OSA on CPAP 04/06/2010   PERIPHERAL NEUROPATHY 01/05/2010   PULMONARY FUNCTION TESTS, ABNORMAL 02/02/2010   Sleep apnea    wears CPAP   Superficial thrombophlebitis of left leg 09/07/2011   TESTICULAR HYPOFUNCTION  01/05/2010   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   Toe pain, right 07/10/2012   Type II diabetes mellitus (HCC) 12/05/2009   UNSPECIFIED ANEMIA 01/05/2010    Tobacco History: Social History   Tobacco Use  Smoking Status Former   Current packs/day: 0.00   Average packs/day: 1 pack/day for 10.0 years (10.0 ttl pk-yrs)   Types: Cigarettes   Start date: 06/07/1970   Quit date: 06/07/1980   Years since quitting: 42.5  Smokeless Tobacco Never   Counseling given: Not Answered   Outpatient Medications Prior to Visit  Medication Sig Dispense Refill   Accu-Chek Softclix Lancets lancets Use as instructed 200 each 3   albuterol (VENTOLIN HFA) 108 (90 Base) MCG/ACT inhaler Inhale 2 puffs into the lungs every 6 (six) hours as needed for wheezing or shortness of breath. 1 Inhaler 6   Alcohol Swabs (DROPSAFE ALCOHOL PREP) 70 % PADS USE AS DIRECTED 300 each 3   allopurinol (ZYLOPRIM) 100 MG tablet Take 1 tablet by mouth once daily 90 tablet 0   apixaban (ELIQUIS) 5 MG TABS tablet Take 1 tablet (5 mg total) by mouth 2 (two) times daily. 60 tablet 5   Blood Glucose Monitoring Suppl (ACCU-CHEK GUIDE ME) w/Device KIT USE TO TEST BLOOD SUGAR ONCE TO TWICE DAILY 1 kit 0   calcium carbonate (TUMS - DOSED IN MG ELEMENTAL CALCIUM) 500 MG chewable tablet Chew 500 mg by mouth daily as needed for indigestion or heartburn.     cetirizine (ZYRTEC) 10 MG tablet Take 1 tablet (10 mg total) by mouth daily. 30 tablet 11   clobetasol ointment (TEMOVATE) 0.05 % 1 application to affected area     Clobetasol Prop Emollient Base (CLOBETASOL PROPIONATE E) 0.05 % emollient cream Apply 1 application topically 2 (two) times daily as needed (insect bite). 60 g 1   colchicine (COLCRYS) 0.6 MG tablet Take one tablet for 4-6 days as needed for gout flare up. 15 tablet 3   dapagliflozin propanediol (FARXIGA) 10 MG TABS tablet Take 1 tablet (10 mg total) by mouth daily. 30 tablet 3   fish oil-omega-3 fatty acids 1000 MG capsule Take 1 g by  mouth daily.     FLUZONE HIGH-DOSE QUADRIVALENT 0.7 ML SUSY      furosemide (LASIX) 80 MG tablet Take 1 tablet (80 mg total) by mouth daily. As needed patient may take an additional 40 MG tablet as direct per [employer] study PRN plan. 90 tablet 1   gabapentin (NEURONTIN) 100 MG capsule Take 2 capsules (200 mg total) by mouth at bedtime. 180 capsule 3   glucose blood (ACCU-CHEK GUIDE) test strip USE TO TEST BLOOD SUGAR ONCE TO TWICE DAILY 200 each 1   hydrALAZINE (APRESOLINE) 25 MG tablet TAKE 1 TABLET BY MOUTH THREE TIMES DAILY 270 tablet 0   HYDROcodone-acetaminophen (NORCO) 10-325 MG tablet Take 1 tablet by mouth every 8 (eight) hours as needed for moderate pain or severe pain. 90 tablet 0   hydrocortisone 2.5 % ointment Apply topically 2 (two) times daily as needed. 30 g 1   isosorbide mononitrate (IMDUR)  30 MG 24 hr tablet Take 1 tablet by mouth once daily 30 tablet 0   LORazepam (ATIVAN) 1 MG tablet TAKE 1 TABLET BY MOUTH EVERY 8 HOURS AS NEEDED FOR ANXIETY 30 tablet 0   magnesium oxide (MAG-OX) 400 MG tablet Take 1 tablet (400 mg total) by mouth 2 (two) times daily. 30 tablet 11   metoprolol succinate (TOPROL-XL) 50 MG 24 hr tablet Take 1 tablet (50 mg total) by mouth in the morning and at bedtime. WITH OR FOLLOWING A MEAL 180 tablet 3   Multiple Vitamin (MULTIVITAMIN WITH MINERALS) TABS tablet Take 1 tablet by mouth daily.      Polyethyl Glycol-Propyl Glycol (SYSTANE OP) Place 1 drop into both eyes daily as needed (dry eyes).     potassium chloride SA (KLOR-CON M20) 20 MEQ tablet Take 1 tablet (20 mEq total) by mouth as directed. 1 tablet daily 20 mEq by mouth as directed per alleviate research protocol. 90 tablet 3   pravastatin (PRAVACHOL) 40 MG tablet Take 1 tablet by mouth once daily 90 tablet 3   Semaglutide,0.25 or 0.5MG /DOS, (OZEMPIC, 0.25 OR 0.5 MG/DOSE,) 2 MG/1.5ML SOPN Inject 0.5 mg into the skin once a week. 4.5 mL 3   tamsulosin (FLOMAX) 0.4 MG CAPS capsule Take 1 capsule  by mouth once daily 30 capsule 0   temazepam (RESTORIL) 30 MG capsule Take 1 capsule (30 mg total) by mouth at bedtime. 30 capsule 3   tiZANidine (ZANAFLEX) 2 MG tablet Take 0.5-1 tablets (1-2 mg total) by mouth at bedtime as needed for muscle spasms. 30 tablet 1   VITAMIN D PO Take by mouth.     white petrolatum (VASELINE) GEL See admin instructions.     No facility-administered medications prior to visit.     Review of Systems:   Constitutional:   No  weight loss, night sweats,  Fevers, chills, fatigue, or  lassitude.  HEENT:   No headaches,  Difficulty swallowing,  Tooth/dental problems, or  Sore throat,                No sneezing, itching, ear ache, nasal congestion, post nasal drip,   CV:  No chest pain,  Orthopnea, PND, swelling in lower extremities, anasarca, dizziness, palpitations, syncope.   GI  No heartburn, indigestion, abdominal pain, nausea, vomiting, diarrhea, change in bowel habits, loss of appetite, bloody stools.   Resp: No shortness of breath with exertion or at rest.  No excess mucus, no productive cough,  No non-productive cough,  No coughing up of blood.  No change in color of mucus.  No wheezing.  No chest wall deformity  Skin: no rash or lesions.  GU: no dysuria, change in color of urine, no urgency or frequency.  No flank pain, no hematuria   MS:  No joint pain or swelling.  No decreased range of motion.  No back pain.    Physical Exam  BP 100/60 (BP Location: Left Arm, Patient Position: Sitting, Cuff Size: Normal)   Pulse 89   Ht 5\' 11"  (1.803 m)   Wt 185 lb (83.9 kg)   SpO2 99%   BMI 25.80 kg/m   GEN: A/Ox3; pleasant , NAD, well nourished    HEENT:  [place]/AT,  EACs-clear, TMs-wnl, NOSE-clear, THROAT-clear, no lesions, no postnasal drip or exudate noted.   NECK:  Supple w/ fair ROM; no JVD; normal carotid impulses w/o bruits; no thyromegaly or nodules palpated; no lymphadenopathy.    RESP  Clear  P & A; w/o,  wheezes/ rales/ or rhonchi. no  accessory muscle use, no dullness to percussion  CARD:  RRR, no m/r/g, no peripheral edema, pulses intact, no cyanosis or clubbing.  GI:   Soft & nt; nml bowel sounds; no organomegaly or masses detected.   Musco: Warm bil, no deformities or joint swelling noted.   Neuro: alert, no focal deficits noted.    Skin: Warm, no lesions or rashes    Lab Results:  CBC    Component Value Date/Time   WBC 4.5 09/21/2022 1650   RBC 5.01 09/21/2022 1650   HGB 13.3 09/21/2022 1650   HGB 14.3 06/09/2022 1130   HCT 40.8 09/21/2022 1650   HCT 45.4 06/09/2022 1130   PLT 178.0 09/21/2022 1650   PLT 196 06/09/2022 1130   MCV 81.4 09/21/2022 1650   MCV 82 06/09/2022 1130   MCH 26.0 (L) 06/09/2022 1130   MCH 24.4 (L) 11/12/2019 1041   MCHC 32.6 09/21/2022 1650   RDW 16.5 (H) 09/21/2022 1650   RDW 14.4 06/09/2022 1130   LYMPHSABS 1.4 09/21/2022 1650   MONOABS 0.8 09/21/2022 1650   EOSABS 0.2 09/21/2022 1650   BASOSABS 0.1 09/21/2022 1650    BMET    Component Value Date/Time   NA 140 09/21/2022 1650   NA 141 06/09/2022 1130   K 4.4 09/21/2022 1650   CL 102 09/21/2022 1650   CO2 31 09/21/2022 1650   GLUCOSE 92 09/21/2022 1650   BUN 24 (H) 09/21/2022 1650   BUN 27 06/09/2022 1130   CREATININE 1.53 (H) 09/21/2022 1650   CREATININE 1.39 (H) 03/24/2015 0943   CALCIUM 9.7 09/21/2022 1650   GFRNONAA 36 (L) 05/20/2020 1045   GFRAA 42 (L) 05/20/2020 1045    BNP    Component Value Date/Time   BNP 145.6 (H) 06/09/2022 1130   BNP 349.6 (H) 11/26/2019 0926    ProBNP    Component Value Date/Time   PROBNP 446.0 (H) 08/02/2019 1506    Imaging: No results found.  Administration History     None           No data to display          No results found for: "NITRICOXIDE"      Assessment & Plan:   OSA (obstructive sleep apnea) Excellent control and compliance on nocturnal BiPAP.  No changes  Plan  Patient Instructions  Continue on BIPAP At bedtime. Keep up good  work.  Goal is to wear for at least 6 -8 hrs each night.  Do not drive if sleepy.  Work on healthy weight.  Healthy sleep regimen .  Follow up with Dr. Vassie Loll or Daison Braxton in 1 year   and As needed   Please contact office for sooner follow up if symptoms do not improve or worsen or seek emergency care        Insomnia Doing well on current regimen.  No changes.     Rubye Oaks, NP 12/27/2022

## 2022-12-27 NOTE — Assessment & Plan Note (Signed)
Doing well on current regimen  No changes

## 2022-12-27 NOTE — Assessment & Plan Note (Signed)
Excellent control and compliance on nocturnal BiPAP.  No changes  Plan  Patient Instructions  Continue on BIPAP At bedtime. Keep up good work.  Goal is to wear for at least 6 -8 hrs each night.  Do not drive if sleepy.  Work on healthy weight.  Healthy sleep regimen .  Follow up with Dr. Vassie Loll or Amisha Pospisil in 1 year   and As needed   Please contact office for sooner follow up if symptoms do not improve or worsen or seek emergency care

## 2022-12-29 DIAGNOSIS — E1122 Type 2 diabetes mellitus with diabetic chronic kidney disease: Secondary | ICD-10-CM | POA: Diagnosis not present

## 2022-12-29 DIAGNOSIS — N2581 Secondary hyperparathyroidism of renal origin: Secondary | ICD-10-CM | POA: Diagnosis not present

## 2022-12-29 DIAGNOSIS — N1832 Chronic kidney disease, stage 3b: Secondary | ICD-10-CM | POA: Diagnosis not present

## 2022-12-29 DIAGNOSIS — B181 Chronic viral hepatitis B without delta-agent: Secondary | ICD-10-CM | POA: Diagnosis not present

## 2022-12-29 DIAGNOSIS — I129 Hypertensive chronic kidney disease with stage 1 through stage 4 chronic kidney disease, or unspecified chronic kidney disease: Secondary | ICD-10-CM | POA: Diagnosis not present

## 2022-12-29 DIAGNOSIS — M1000 Idiopathic gout, unspecified site: Secondary | ICD-10-CM | POA: Diagnosis not present

## 2022-12-29 DIAGNOSIS — E876 Hypokalemia: Secondary | ICD-10-CM | POA: Diagnosis not present

## 2023-01-03 ENCOUNTER — Telehealth: Payer: Self-pay | Admitting: Cardiovascular Disease

## 2023-01-03 NOTE — Telephone Encounter (Signed)
Patient aware its safe to get MRI with loop recorder.  He stated they will need a model number.

## 2023-01-03 NOTE — Telephone Encounter (Signed)
Pt called and stated that [facility] would like to do a MRI of his prostate and would like to know if he can have this MRI with the Loop record in chest. They were asking him all the detail of the device and he was not sure what to tell them   Best number [phone]

## 2023-01-04 NOTE — Telephone Encounter (Signed)
Called and left the information below on pt's answering machine. Also sent via MyChart  Pulse Gen Model: [id number] Reveal LINQ      Pulse Gen Serial Number    [id number]   Manufacturer: [employer]   Sent this information through [employer]

## 2023-01-05 ENCOUNTER — Encounter (INDEPENDENT_AMBULATORY_CARE_PROVIDER_SITE_OTHER): Payer: Self-pay

## 2023-01-08 ENCOUNTER — Other Ambulatory Visit (HOSPITAL_COMMUNITY): Payer: Self-pay | Admitting: Internal Medicine

## 2023-01-10 NOTE — Progress Notes (Signed)
Advanced Heart Failure Clinic Note   Referring Physician: PCP: Bradd Canary, MD PCP-Cardiologist: Thurmon Fair, MD  [facility]: Dr. 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   Admitted 05/16/19 for PVI with successful procedure. Post procedure he developed cardiogenic shock and respiratory failure. CCM consulted for intubation. Advanced HF team consulted. As he improved he was extubated.  Treated with pressors and gradually weaned off. HF meds initiated but unable to treat w/ ARB/ARNI nor spiro due to AKI, SCr peaking to 2.2. Tolerated Bidil and digoxin. ECHO initially showed EF 10% but improved 35-40% prior to discharge. Was discharged home on 05/29/19.  He went back in A fib 08/02/19. Saw pulmonary on 08/02/19 and was noted to have many central events. CPAP was to be changed to auto titration.  On 08/16/19 he had successful cardioversion for afib.   We have not seem him since 7/21 Had Repeat PVI in 10/21. Has remained in NSR  Follows with Dr. Addison Lank   He presents back for f/u. Remains in Alleviate study (HF remote ILR device). Feels great. Remains active. No CP, SOB, orthopnea, PND or edema. SBP at home 95-110.   [name] Past Medical History:  Diagnosis Date   AORTIC STENOSIS, MODERATE 12/05/2009   Arthritis    Atrial fibrillation (HCC)    Atrial fibrillation with RVR (HCC) 07/05/2017   Atrial flutter (HCC) 12/05/2009   Atypical chest pain 11/08/2011   BENIGN PROSTATIC HYPERTROPHY, HX OF 12/05/2009   CHEST PAIN-UNSPECIFIED 11/11/2009   CHF 12/05/2009   Chronic renal insufficiency 12/16/2016   Debility 04/18/2012   Diarrhea 12/16/2016   DYSPNEA 12/19/2009   Edema 04/18/2012   ERECTILE DYSFUNCTION 01/25/2007   FATIGUE, CHRONIC 11/11/2009   Gout 07/10/2012   Heart murmur    History of kidney stones    "they passed" (07/05/2017)   Hyperkalemia 04/18/2012   Hyperlipidemia    Hypertension     HYPERTENSION 01/25/2007   Hypoxia 05/05/2011   Insomnia 05/13/2016   INSOMNIA, HX OF 01/25/2007   Knee pain, bilateral 12/28/2010   Knee pain, right 12/28/2010   NEUROMA 01/05/2010   OBESITY NOS 01/25/2007   OSA on CPAP 04/06/2010   PERIPHERAL NEUROPATHY 01/05/2010   PULMONARY FUNCTION TESTS, ABNORMAL 02/02/2010   Sleep apnea    wears CPAP   Superficial thrombophlebitis of left leg 09/07/2011   TESTICULAR HYPOFUNCTION 01/05/2010   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   Toe pain, right 07/10/2012   Type II diabetes mellitus (HCC) 12/05/2009   UNSPECIFIED ANEMIA 01/05/2010    Current Outpatient Medications  Medication Sig Dispense Refill   Accu-Chek Softclix Lancets lancets Use as instructed 200 each 3   albuterol (VENTOLIN HFA) 108 (90 Base) MCG/ACT inhaler Inhale 2 puffs into the lungs every 6 (six) hours as needed for wheezing or shortness of breath. 1 Inhaler 6   Alcohol Swabs (DROPSAFE ALCOHOL PREP) 70 % PADS USE AS DIRECTED 300 each 3   allopurinol (ZYLOPRIM) 100 MG tablet Take 1 tablet by mouth once daily 90 tablet 0   apixaban (ELIQUIS) 5 MG TABS tablet Take 1 tablet (5 mg total) by mouth 2 (two) times daily. 60 tablet 5   Blood Glucose Monitoring Suppl (ACCU-CHEK GUIDE ME) w/Device KIT USE TO TEST BLOOD SUGAR ONCE TO TWICE DAILY 1 kit 0   calcium carbonate (TUMS - DOSED IN MG ELEMENTAL CALCIUM) 500 MG chewable tablet Chew 500 mg by mouth  daily as needed for indigestion or heartburn.     cetirizine (ZYRTEC) 10 MG tablet Take 1 tablet (10 mg total) by mouth daily. 30 tablet 11   clobetasol ointment (TEMOVATE) 0.05 % 1 application to affected area     Clobetasol Prop Emollient Base (CLOBETASOL PROPIONATE E) 0.05 % emollient cream Apply 1 application topically 2 (two) times daily as needed (insect bite). 60 g 1   colchicine (COLCRYS) 0.6 MG tablet Take one tablet for 4-6 days as needed for gout flare up. 15 tablet 3   dapagliflozin propanediol (FARXIGA) 10 MG TABS tablet Take 1 tablet (10  mg total) by mouth daily. 30 tablet 3   fish oil-omega-3 fatty acids 1000 MG capsule Take 1 g by mouth daily.     FLUZONE HIGH-DOSE QUADRIVALENT 0.7 ML SUSY      furosemide (LASIX) 80 MG tablet Take 1 tablet (80 mg total) by mouth daily. As needed patient may take an additional 40 MG tablet as direct per [employer] study PRN plan. 90 tablet 1   gabapentin (NEURONTIN) 100 MG capsule Take 2 capsules (200 mg total) by mouth at bedtime. 180 capsule 3   glucose blood (ACCU-CHEK GUIDE) test strip USE TO TEST BLOOD SUGAR ONCE TO TWICE DAILY 200 each 1   hydrALAZINE (APRESOLINE) 25 MG tablet TAKE 1 TABLET BY MOUTH THREE TIMES DAILY 270 tablet 0   HYDROcodone-acetaminophen (NORCO) 10-325 MG tablet Take 1 tablet by mouth every 8 (eight) hours as needed for moderate pain or severe pain. 90 tablet 0   hydrocortisone 2.5 % ointment Apply topically 2 (two) times daily as needed. 30 g 1   isosorbide mononitrate (IMDUR) 30 MG 24 hr tablet Take 1 tablet by mouth once daily 30 tablet 0   LORazepam (ATIVAN) 1 MG tablet TAKE 1 TABLET BY MOUTH EVERY 8 HOURS AS NEEDED FOR ANXIETY 30 tablet 0   magnesium oxide (MAG-OX) 400 MG tablet Take 1 tablet (400 mg total) by mouth 2 (two) times daily. 30 tablet 11   metoprolol succinate (TOPROL-XL) 50 MG 24 hr tablet Take 1 tablet (50 mg total) by mouth in the morning and at bedtime. WITH OR FOLLOWING A MEAL 180 tablet 3   Multiple Vitamin (MULTIVITAMIN WITH MINERALS) TABS tablet Take 1 tablet by mouth daily.      Polyethyl Glycol-Propyl Glycol (SYSTANE OP) Place 1 drop into both eyes daily as needed (dry eyes).     potassium chloride SA (KLOR-CON M20) 20 MEQ tablet Take 1 tablet (20 mEq total) by mouth as directed. 1 tablet daily 20 mEq by mouth as directed per alleviate research protocol. 90 tablet 3   pravastatin (PRAVACHOL) 40 MG tablet Take 1 tablet by mouth once daily 90 tablet 3   Semaglutide,0.25 or 0.5MG /DOS, (OZEMPIC, 0.25 OR 0.5 MG/DOSE,) 2 MG/1.5ML SOPN Inject  0.5 mg into the skin once a week. 4.5 mL 3   tamsulosin (FLOMAX) 0.4 MG CAPS capsule Take 1 capsule by mouth once daily 30 capsule 0   temazepam (RESTORIL) 30 MG capsule Take 1 capsule (30 mg total) by mouth at bedtime. 30 capsule 3   tiZANidine (ZANAFLEX) 2 MG tablet Take 0.5-1 tablets (1-2 mg total) by mouth at bedtime as needed for muscle spasms. 30 tablet 1   VITAMIN D PO Take by mouth.     white petrolatum (VASELINE) GEL See admin instructions.     No current facility-administered medications for this visit.    Allergies  Allergen Reactions   Sulfonamide Derivatives Other (  See Comments)    Flu like symptoms    Zolpidem Other (See Comments)    Excessive, prolonged sedation      Social History   Socioeconomic History   Marital status: Married    Spouse name: Not on file   Number of children: 4   Years of education: 12   Highest education level: High school graduate  Occupational History   Occupation: retired    Associate Professor: [employer]  Tobacco Use   Smoking status: Former    Current packs/day: 0.00    Average packs/day: 1 pack/day for 10.0 years (10.0 ttl pk-yrs)    Types: Cigarettes    Start date: 06/07/1970    Quit date: 06/07/1980    Years since quitting: 42.6   Smokeless tobacco: Never  Vaping Use   Vaping status: Never Used  Substance and Sexual Activity   Alcohol use: No   Drug use: No   Sexual activity: Not Currently  Other Topics Concern   Not on file  Social History Narrative   Lives with male partner in a one story home.  His son lives there off and on.  Retired from [employer].  Education: high school.  Right handed   Social Determinants of Health   Financial Resource Strain: Not on file  Food Insecurity: Not on file  Transportation Needs: Not on file  Physical Activity: Not on file  Stress: Stress Concern Present (04/05/2022)   [employer] of Occupational Health - Occupational Stress Questionnaire    Feeling of Stress : To some extent  Social  Connections: Not on file  Intimate Partner Violence: Not on file      Family History  Problem Relation Age of Onset   Clotting disorder Mother    Heart disease Mother        s/p MI   Heart attack Mother    Hypertension Mother    Diabetes Mother    Hyperlipidemia Mother    Stroke Mother    Cancer Father        ? lung   Lung disease Father        smoker   Diabetes Sister    Hypertension Sister        smoker   Aneurysm Sister        brain   Other Sister        clipped   Seizures Sister        d/o w/aneurysm/ smoker   Leukemia Maternal Grandmother        ?   Colon cancer Neg Hx    Colon polyps Neg Hx    Stomach cancer Neg Hx    Rectal cancer Neg Hx    Esophageal cancer Neg Hx     There were no vitals filed for this visit.    Wt Readings from Last 3 Encounters:  12/27/22 83.9 kg (185 lb)  11/02/22 83.2 kg (183 lb 6.4 oz)  09/21/22 83.1 kg (183 lb 3.2 oz)    PHYSICAL EXAM: General:  Well appearing. No resp difficulty HEENT: normal Neck: supple. no JVD. Carotids 2+ bilat; no bruits. No lymphadenopathy or thryomegaly appreciated. Cor: PMI nondisplaced. Regular rate & rhythm. 2/6 AI Lungs: clear Abdomen: soft, nontender, nondistended. No hepatosplenomegaly. No bruits or masses. Good bowel sounds. Extremities: no cyanosis, clubbing, rash, edema Neuro: alert & orientedx3, cranial nerves grossly intact. moves all 4 extremities w/o difficulty. Affect pleasant   ASSESSMENT & PLAN:  1. 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  EF 10% by TEE 12/9 likely due to tachy CM Initially on pressors after PVI.  - Limited ECHO completed on 05/28/19 and showed EF improving with SR, 40-45%.  - ECHO 07/20/19: EF 35-40%  - TEE 10/21 EF 35-40% - Had Linq  placement. - In ALLEVIATE HF  - Doing great NYHA I - Volume status stable on lasix 80 daily. - GDMT  previously limited by CKD however SCr has improved from 2.1 -> 1.5 May be candidate for ARNI/ARB however BP has been too low  - Continue Farxiga 10  - Continue Toprol 50mg  bid  - Continue Hydralazine 25 tid + Imdur 30 daily.  - Needs repeat echo - recent labs reviewed and looked good    2. Persistent AF - s/p  PVI 12/19. Repeat PVI in 10/21 - Maintaining NSR. Now off amio  - EP following (Dr. Elberta Fortis) - Continue Eliquis.  - Continue Toprol XL    3. CKD 3a - Scr 2.15 on 09/18/19 - More recently SCr 1.5-1.6 range - Will repeat labs today - Follows with Nephrology in [place]  4. OSA - Compliant w/Bipap  5. DM2 - last HgBa1c 5.9% - Continue Farxiga  6. Aortic insufficiency - moderate - will reassess degree of AI as well as LV size/function on f/u echo   Jacklynn Ganong, [place] 01/10/23

## 2023-01-11 ENCOUNTER — Ambulatory Visit (HOSPITAL_COMMUNITY)
Admission: RE | Admit: 2023-01-11 | Discharge: 2023-01-11 | Disposition: A | Discharge status: Home / Self Care | Payer: Medicare HMO | Source: Ambulatory Visit | Attending: Family Medicine | Admitting: Family Medicine

## 2023-01-11 ENCOUNTER — Encounter (HOSPITAL_COMMUNITY): Payer: Self-pay

## 2023-01-11 VITALS — BP 110/70 | HR 71 | Wt 185.4 lb

## 2023-01-11 DIAGNOSIS — N1832 Chronic kidney disease, stage 3b: Secondary | ICD-10-CM | POA: Diagnosis not present

## 2023-01-11 DIAGNOSIS — E1122 Type 2 diabetes mellitus with diabetic chronic kidney disease: Secondary | ICD-10-CM | POA: Insufficient documentation

## 2023-01-11 DIAGNOSIS — Z9889 Other specified postprocedural states: Secondary | ICD-10-CM | POA: Diagnosis not present

## 2023-01-11 DIAGNOSIS — I351 Nonrheumatic aortic (valve) insufficiency: Secondary | ICD-10-CM | POA: Diagnosis not present

## 2023-01-11 DIAGNOSIS — E1142 Type 2 diabetes mellitus with diabetic polyneuropathy: Secondary | ICD-10-CM | POA: Diagnosis not present

## 2023-01-11 DIAGNOSIS — N183 Chronic kidney disease, stage 3 unspecified: Secondary | ICD-10-CM

## 2023-01-11 DIAGNOSIS — Z7901 Long term (current) use of anticoagulants: Secondary | ICD-10-CM | POA: Insufficient documentation

## 2023-01-11 DIAGNOSIS — I5042 Chronic combined systolic (congestive) and diastolic (congestive) heart failure: Secondary | ICD-10-CM | POA: Diagnosis not present

## 2023-01-11 DIAGNOSIS — R000 Tachycardia, unspecified: Secondary | ICD-10-CM | POA: Insufficient documentation

## 2023-01-11 DIAGNOSIS — I493 Ventricular premature depolarization: Secondary | ICD-10-CM | POA: Insufficient documentation

## 2023-01-11 DIAGNOSIS — I4819 Other persistent atrial fibrillation: Secondary | ICD-10-CM | POA: Diagnosis not present

## 2023-01-11 DIAGNOSIS — Z794 Long term (current) use of insulin: Secondary | ICD-10-CM

## 2023-01-11 DIAGNOSIS — I480 Paroxysmal atrial fibrillation: Secondary | ICD-10-CM

## 2023-01-11 DIAGNOSIS — Z7984 Long term (current) use of oral hypoglycemic drugs: Secondary | ICD-10-CM | POA: Insufficient documentation

## 2023-01-11 DIAGNOSIS — I5022 Chronic systolic (congestive) heart failure: Secondary | ICD-10-CM | POA: Insufficient documentation

## 2023-01-11 DIAGNOSIS — I130 Hypertensive heart and chronic kidney disease with heart failure and stage 1 through stage 4 chronic kidney disease, or unspecified chronic kidney disease: Secondary | ICD-10-CM | POA: Insufficient documentation

## 2023-01-11 DIAGNOSIS — Z87891 Personal history of nicotine dependence: Secondary | ICD-10-CM | POA: Diagnosis not present

## 2023-01-11 DIAGNOSIS — G4733 Obstructive sleep apnea (adult) (pediatric): Secondary | ICD-10-CM | POA: Diagnosis not present

## 2023-01-11 DIAGNOSIS — Z79899 Other long term (current) drug therapy: Secondary | ICD-10-CM | POA: Diagnosis not present

## 2023-01-11 MED ORDER — ISOSORBIDE MONONITRATE ER 30 MG PO TB24: 30.0000 mg | ORAL_TABLET | Freq: Every day | ORAL | 3 refills | Status: DC

## 2023-01-11 MED ORDER — LOSARTAN POTASSIUM 25 MG PO TABS: 12.5000 mg | ORAL_TABLET | Freq: Every day | ORAL | 3 refills | Status: DC

## 2023-01-11 MED ORDER — HYDRALAZINE HCL 10 MG PO TABS: 10.0000 mg | ORAL_TABLET | Freq: Three times a day (TID) | ORAL | 3 refills | Status: DC

## 2023-01-11 NOTE — Patient Instructions (Signed)
DECREASE Hydralazine to 10 mg Three times a day  START Losartan 12.5 mg ( 1/2 Tab ) daily.  Please get blood work in 10 days.  Your physician recommends that you schedule a follow-up appointment in: 6 months (February 2025) ** please call the office in November to arrange your follow up appointment. **  If you have any questions or concerns before your next appointment please send [place] a message through [place] or call our office at [phone].    TO LEAVE A MESSAGE FOR THE NURSE SELECT OPTION 2, PLEASE LEAVE A MESSAGE INCLUDING: YOUR NAME DATE OF BIRTH CALL BACK NUMBER REASON FOR CALL**this is important as we prioritize the call backs  YOU WILL RECEIVE A CALL BACK THE SAME DAY AS LONG AS YOU CALL BEFORE 4:00 PM  At the [facility], you and your health needs are our priority. As part of our continuing mission to provide you with exceptional heart care, we have created designated Provider Care Teams. These Care Teams include your primary Cardiologist (physician) and Advanced Practice Providers (APPs- Physician Assistants and Nurse Practitioners) who all work together to provide you with the care you need, when you need it.   You may see any of the following providers on your designated Care Team at your next follow up: Dr Arvilla Meres Dr Marca Ancona Dr. Marcos Eke, NP Robbie Lis, [place] [facility] [place], [place] Brynda Peon, NP Karle Plumber, PharmD   Please be sure to bring in all your medications bottles to every appointment.    Thank you for choosing [facility]

## 2023-01-13 ENCOUNTER — Other Ambulatory Visit: Payer: Self-pay | Admitting: Cardiology

## 2023-01-17 ENCOUNTER — Ambulatory Visit: Payer: Medicare HMO | Admitting: Cardiovascular Disease

## 2023-01-19 NOTE — Assessment & Plan Note (Signed)
hgba1c acceptable, minimize simple carbs. Increase exercise as tolerated.  

## 2023-01-19 NOTE — Assessment & Plan Note (Signed)
Rate controlled and tolerating current meds, follows with cardiology

## 2023-01-19 NOTE — Assessment & Plan Note (Signed)
Hydrate and monitor 

## 2023-01-19 NOTE — Assessment & Plan Note (Signed)
No recent exacerbation, follows with cards

## 2023-01-19 NOTE — Assessment & Plan Note (Signed)
Well controlled, no changes to meds. Encouraged heart healthy diet such as the DASH diet and exercise as tolerated.  °

## 2023-01-20 ENCOUNTER — Ambulatory Visit (INDEPENDENT_AMBULATORY_CARE_PROVIDER_SITE_OTHER): Payer: Medicare HMO | Admitting: Family Medicine

## 2023-01-20 ENCOUNTER — Other Ambulatory Visit: Payer: Self-pay | Admitting: Emergency Medicine

## 2023-01-20 ENCOUNTER — Telehealth: Payer: Self-pay | Admitting: Cardiovascular Disease

## 2023-01-20 VITALS — BP 122/70 | HR 92 | Temp 97.6°F | Resp 16 | Ht 71.0 in | Wt 186.2 lb

## 2023-01-20 DIAGNOSIS — I4891 Unspecified atrial fibrillation: Secondary | ICD-10-CM

## 2023-01-20 DIAGNOSIS — R972 Elevated prostate specific antigen [PSA]: Secondary | ICD-10-CM | POA: Diagnosis not present

## 2023-01-20 DIAGNOSIS — E1122 Type 2 diabetes mellitus with diabetic chronic kidney disease: Secondary | ICD-10-CM | POA: Diagnosis not present

## 2023-01-20 DIAGNOSIS — N189 Chronic kidney disease, unspecified: Secondary | ICD-10-CM | POA: Diagnosis not present

## 2023-01-20 DIAGNOSIS — Z006 Encounter for examination for normal comparison and control in clinical research program: Secondary | ICD-10-CM

## 2023-01-20 DIAGNOSIS — I5042 Chronic combined systolic (congestive) and diastolic (congestive) heart failure: Secondary | ICD-10-CM | POA: Diagnosis not present

## 2023-01-20 DIAGNOSIS — M1A9XX0 Chronic gout, unspecified, without tophus (tophi): Secondary | ICD-10-CM

## 2023-01-20 DIAGNOSIS — I10 Essential (primary) hypertension: Secondary | ICD-10-CM

## 2023-01-20 DIAGNOSIS — R351 Nocturia: Secondary | ICD-10-CM

## 2023-01-20 DIAGNOSIS — Z7985 Long-term (current) use of injectable non-insulin antidiabetic drugs: Secondary | ICD-10-CM | POA: Diagnosis not present

## 2023-01-20 LAB — CBC WITH DIFFERENTIAL/PLATELET
Basophils Absolute: 0.1 10*3/uL (ref 0.0–0.1)
Basophils Relative: 1.3 % (ref 0.0–3.0)
Eosinophils Absolute: 0.2 10*3/uL (ref 0.0–0.7)
Eosinophils Relative: 3.6 % (ref 0.0–5.0)
HCT: 39.3 % (ref 39.0–52.0)
Hemoglobin: 12.5 g/dL — ABNORMAL LOW (ref 13.0–17.0)
Lymphocytes Relative: 34.8 % (ref 12.0–46.0)
Lymphs Abs: 1.5 10*3/uL (ref 0.7–4.0)
MCHC: 31.7 g/dL (ref 30.0–36.0)
MCV: 81.3 fl (ref 78.0–100.0)
Monocytes Absolute: 0.7 10*3/uL (ref 0.1–1.0)
Monocytes Relative: 16.7 % — ABNORMAL HIGH (ref 3.0–12.0)
Neutro Abs: 1.9 10*3/uL (ref 1.4–7.7)
Neutrophils Relative %: 43.6 % (ref 43.0–77.0)
Platelets: 205 10*3/uL (ref 150.0–400.0)
RBC: 4.83 Mil/uL (ref 4.22–5.81)
RDW: 15.9 % — ABNORMAL HIGH (ref 11.5–15.5)
WBC: 4.3 10*3/uL (ref 4.0–10.5)

## 2023-01-20 LAB — LIPID PANEL
Cholesterol: 144 mg/dL (ref 0–200)
HDL: 38.4 mg/dL — ABNORMAL LOW (ref >39.00)
LDL Cholesterol: 87 mg/dL (ref 0–99)
NonHDL: 105.72
Total CHOL/HDL Ratio: 4
Triglycerides: 92 mg/dL (ref 0.0–149.0)
VLDL: 18.4 mg/dL (ref 0.0–40.0)

## 2023-01-20 LAB — URINALYSIS, ROUTINE W REFLEX MICROSCOPIC
Bilirubin Urine: NEGATIVE
Hgb urine dipstick: NEGATIVE
Ketones, ur: NEGATIVE
Leukocytes,Ua: NEGATIVE
Nitrite: NEGATIVE
RBC / HPF: NONE SEEN (ref 0–?)
Specific Gravity, Urine: 1.015 (ref 1.000–1.030)
Total Protein, Urine: NEGATIVE
Urine Glucose: >=1000 — AB
Urobilinogen, UA: 0.2 (ref 0.0–1.0)
pH: 6 (ref 5.0–8.0)

## 2023-01-20 LAB — COMPREHENSIVE METABOLIC PANEL
ALT: 10 U/L (ref 0–53)
AST: 12 U/L (ref 0–37)
Albumin: 4.1 g/dL (ref 3.5–5.2)
Alkaline Phosphatase: 44 U/L (ref 39–117)
BUN: 33 mg/dL — ABNORMAL HIGH (ref 6–23)
CO2: 31 mEq/L (ref 19–32)
Calcium: 9.5 mg/dL (ref 8.4–10.5)
Chloride: 99 mEq/L (ref 96–112)
Creatinine, Ser: 1.75 mg/dL — ABNORMAL HIGH (ref 0.40–1.50)
GFR: 39.09 mL/min — ABNORMAL LOW (ref >60.00)
Glucose, Bld: 120 mg/dL — ABNORMAL HIGH (ref 70–99)
Potassium: 4.3 mEq/L (ref 3.5–5.1)
Sodium: 139 mEq/L (ref 135–145)
Total Bilirubin: 0.5 mg/dL (ref 0.2–1.2)
Total Protein: 7.3 g/dL (ref 6.0–8.3)

## 2023-01-20 LAB — HEMOGLOBIN A1C: Hgb A1c MFr Bld: 6.5 % (ref 4.6–6.5)

## 2023-01-20 LAB — TSH: TSH: 1.85 u[IU]/mL (ref 0.35–5.50)

## 2023-01-20 LAB — URIC ACID: Uric Acid, Serum: 6.9 mg/dL (ref 4.0–7.8)

## 2023-01-20 LAB — PSA: PSA: 16.25 ng/mL — ABNORMAL HIGH (ref 0.10–4.00)

## 2023-01-20 MED ORDER — DAPAGLIFLOZIN PROPANEDIOL 10 MG PO TABS: 10.0000 mg | ORAL_TABLET | Freq: Every day | ORAL | 3 refills | Status: DC

## 2023-01-20 NOTE — Telephone Encounter (Signed)
*  STAT* If patient is at the pharmacy, call can be transferred to refill team.   1. Which medications need to be refilled? (please list name of each medication and dose if known) furosemide (LASIX) 80 MG tablet   2. Which pharmacy/location (including street and city if local pharmacy) is medication to be sent to? [facility] [address] [place] - [address]   3. Do they need a 30 day or 90 day supply? 90

## 2023-01-20 NOTE — Telephone Encounter (Signed)
Please review

## 2023-01-20 NOTE — Patient Instructions (Signed)
Living With Heart Failure Heart failure is a long-term (chronic) condition in which the heart cannot pump enough blood through the body. When this happens, parts of the body do not get the blood and oxygen they need. There is no cure for heart failure at this time, so it is important for you to take good care of yourself and follow the treatment plan you set with your health care provider. If you are living with heart failure, there are ways to help you manage the disease. How to manage lifestyle changes Living with heart failure requires you to make changes in your life. Your health care team will teach you about the changes you need to make in order to relieve your symptoms and lower your risk of going to the hospital. Work with your health care provider to develop a treatment plan that works for you. Activity Ask your health care provider about attending cardiac rehabilitation. These programs include aerobic physical activity, which provides many benefits for your heart. If no cardiac rehabilitation program is available, ask your health care provider what aerobic exercises are safe for you to do. Return to your normal activities as told by your health care provider. Ask your health care provider what activities are safe for you. Pace your daily activities and allow time for rest as needed. Managing stress It is normal to have many emotions about your diagnosis, such as fear, sadness, anger, and loss. If you feel any of these emotions and need help coping, contact your health care provider. Here are some ways to help yourself manage these emotions: Talk to friends and family members about your condition. They can give you support and guidance. Explain your symptoms to them and, if comfortable, invite them to attend appointments or rehabilitation with you. Join a support group for people with chronic heart failure. Talking with other people who have the same symptoms may give you new ways of coping  with your disease and your emotions. Accept help from others. Do not be ashamed if you need help with certain tasks. Use stress management techniques, such as meditation, breathing exercises, or listening to relaxing music. Conditions such as depression and anxiety are common in persons with heart failure. Pay attention to changes in your mood, emotions, and stress levels. Tell your health care provider if you have any of the following symptoms: Trouble sleeping or a change in your sleeping patterns. Feeling sad, down, or depressed more often than not, every day for more than 2 weeks. Losing interest in activities you normally enjoy. Feeling irritable or crying for no reason. Finding yourself worrying about the future often. Work You may need to develop a plan with your health care provider if heart failure interferes with your ability to work. This may include: Reducing your work hours. Finding functions that are less active or require less effort. Planning rest periods during your work hours. Travel Talk with your health care provider if you plan to travel. There may be circumstances in which your health care provider recommends that you do not travel or that you delay travel until your condition is under control. When you travel, bring your medicine and a list of your medicines. If you are traveling by air, keep your medicines with you in a carry-on bag. Consider finding a medical facility in the area you will be traveling to and determine what your health insurance will cover. If you will be traveling by public transportation (airplane, train, bus), contact the company prior to traveling  if you have special needs. This may include needs related to diet, oxygen, a wheelchair, a seating request, or help with luggage. If you use oxygen, make sure to bring enough oxygen with you. If you have a battery-powered device, bring a fully charged extra battery with you. If you have a device, bring a  note from your health care provider and inform all security screening personnel that you have the device. You may need to go through special screening for safety. Sexual activity  Ask your health care provider when it is safe for you to resume sexual activity. You may need to start slowly and gradually increase intimacy. You can increase intimacy by doing such things as caressing, touching, and holding each other. Get regular exercise as told by your health care provider. This can benefit your sex life by building strength and endurance. Sleep If your condition interferes with your sleep, find ways to improve your sleep quality, such as: Sleep lying on your side, or sleep with your head elevated by raising the head of your bed or using multiple pillows. Ask your health care provider about screening for sleep apnea. Try to go to sleep and wake up at the same times every day. Sleep in a dark, cool room. Do not do any physical activity or eating for a few hours before bedtime. Plan rest periods during the day, but do not take long naps during the day.  Where to find support Consider talking with: Family members. Close friends. A mental health professional or therapist. A member of your church, faith, or community group. Other sources of support include: Local support groups. Ask your health care provider about groups near you. Online support groups, such as those found through the American Heart Association: supportnetwork.heart.org Local home care agencies, community agencies, or social agencies. A palliative care specialist. Palliative care can help you manage symptoms, promote comfort, improve quality of life, and maintain dignity. Where to find more information American Heart Association: heart.org National Heart, Lung, and Blood Institute: BuffaloDryCleaner.gl Centers for Disease Control and Prevention: TonerPromos.no Heart Failure Society of America: hfsa.org Contact a health care provider  if: You have a rapid weight gain. You have increasing shortness of breath that is unusual for you. You are unable to participate in your usual physical activities. You tire easily. You have difficulty sleeping, such as: You wake up feeling short of breath. You have to use more pillows to raise your head in order to sleep. You cough more than normal, especially with physical activity. You have any swelling or more swelling in areas such as your hands, feet, ankles, or abdomen. You become dizzy or light-headed when you stand up. You have changes to your appetite. You have symptoms of depression or anxiety. Get help right away if: You have difficulty breathing. You notice or your family notices a change in your awareness, such as having trouble staying awake or having difficulty with concentration. You have pain or discomfort in your chest. You have an episode of fainting (syncope). You feel like your heart is beating quickly (palpitations). You have extreme feelings of sadness or loss of hope, or you have thoughts about hurting yourself or others. These symptoms may represent a serious problem that is an emergency. Do not wait to see if the symptoms will go away. Get medical help right away. Call your local emergency services (911 in the [place] Do not drive yourself to the hospital. Summary There is no cure for heart failure, so it is  important for you to take good care of yourself and follow the treatment plan set by your health care provider. Ask your health care provider about attending cardiac rehabilitation. These programs include aerobic physical activity, which provides many benefits for your heart. It is normal to have many emotions about your diagnosis, such as fear, sadness, anger, and loss. If you feel any of these emotions and need help coping, contact your health care provider. You may need to develop a plan with your health care provider if heart failure interferes with your  ability to work. This information is not intended to replace advice given to you by your health care provider. Make sure you discuss any questions you have with your health care provider. Document Revised: 09/01/2021 Document Reviewed: 01/07/2020 Elsevier Patient Education  2024 [employer].

## 2023-01-20 NOTE — Assessment & Plan Note (Signed)
Patient follows with Dr Wilson Singer with [facility]. They do a DRE every 6 months or so. No palpable concerning lesions but his PSA continues to trend up. 8 to 13 to 15 to 19.2 in June 2024. They are now recommending serial CT scans. No new urinary symptoms. Patient does have some nocturia intermittently

## 2023-01-20 NOTE — Progress Notes (Signed)
Was sent a Refill Request for Farxiga from [facility] via fax  E-prescribed

## 2023-01-21 ENCOUNTER — Encounter: Payer: Self-pay | Admitting: Family Medicine

## 2023-01-21 ENCOUNTER — Ambulatory Visit (HOSPITAL_COMMUNITY)
Admission: RE | Admit: 2023-01-21 | Discharge: 2023-01-21 | Disposition: A | Discharge status: Home / Self Care | Payer: Medicare HMO | Source: Ambulatory Visit | Attending: Cardiology | Admitting: Cardiology

## 2023-01-21 DIAGNOSIS — I5042 Chronic combined systolic (congestive) and diastolic (congestive) heart failure: Secondary | ICD-10-CM | POA: Diagnosis not present

## 2023-01-21 DIAGNOSIS — N189 Chronic kidney disease, unspecified: Secondary | ICD-10-CM

## 2023-01-21 DIAGNOSIS — I4891 Unspecified atrial fibrillation: Secondary | ICD-10-CM

## 2023-01-21 DIAGNOSIS — E1122 Type 2 diabetes mellitus with diabetic chronic kidney disease: Secondary | ICD-10-CM

## 2023-01-21 DIAGNOSIS — I10 Essential (primary) hypertension: Secondary | ICD-10-CM

## 2023-01-21 LAB — BASIC METABOLIC PANEL
Anion gap: 11 (ref 5–15)
BUN: 28 mg/dL — ABNORMAL HIGH (ref 8–23)
CO2: 26 mmol/L (ref 22–32)
Calcium: 9.2 mg/dL (ref 8.9–10.3)
Chloride: 101 mmol/L (ref 98–111)
Creatinine, Ser: 1.57 mg/dL — ABNORMAL HIGH (ref 0.61–1.24)
GFR, Estimated: 47 mL/min — ABNORMAL LOW (ref >60)
Glucose, Bld: 136 mg/dL — ABNORMAL HIGH (ref 70–99)
Potassium: 3.8 mmol/L (ref 3.5–5.1)
Sodium: 138 mmol/L (ref 135–145)

## 2023-01-21 MED ORDER — FUROSEMIDE 80 MG PO TABS: ORAL_TABLET | ORAL | 1 refills | Status: DC

## 2023-01-21 NOTE — Telephone Encounter (Signed)
Pt's medication was sent to pt's pharmacy as requested. Confirmation received.  °

## 2023-01-22 LAB — URINE CULTURE
MICRO NUMBER:: 15335804
SPECIMEN QUALITY:: ADEQUATE

## 2023-01-23 ENCOUNTER — Other Ambulatory Visit: Payer: Self-pay | Admitting: Family Medicine

## 2023-01-23 ENCOUNTER — Encounter: Payer: Self-pay | Admitting: Family Medicine

## 2023-01-23 MED ORDER — CEFDINIR 300 MG PO CAPS: 300.0000 mg | ORAL_CAPSULE | Freq: Two times a day (BID) | ORAL | 0 refills | Status: DC

## 2023-01-23 NOTE — Progress Notes (Signed)
Subjective:    Patient ID: [name], male    DOB: 1950/10/13, 72 y.o.   MRN: [id number]  Chief Complaint  Patient presents with   Follow-up    Follow up    HPI Discussed the use of AI scribe software for clinical note transcription with the patient, who gave verbal consent to proceed.  History of Present Illness   The patient, with a history of various health issues, presents for a routine follow up. He expresses a desire to maintain control over his health and is proactive in seeking information and understanding his condition. He recently celebrated his 72th birthday and acknowledges that his health concerns have increased with age. He expresses a positive attitude towards his health, stating that he is focused on the things he can change and accepting of the things he cannot. He also mentions a recent bout of stomach discomfort, which he believes may have been due to something he ate or a minor stomach bug.        Past Medical History:  Diagnosis Date   AORTIC STENOSIS, MODERATE 12/05/2009   Arthritis    Atrial fibrillation (HCC)    Atrial fibrillation with RVR (HCC) 07/05/2017   Atrial flutter (HCC) 12/05/2009   Atypical chest pain 11/08/2011   BENIGN PROSTATIC HYPERTROPHY, HX OF 12/05/2009   CHEST PAIN-UNSPECIFIED 11/11/2009   CHF 12/05/2009   Chronic renal insufficiency 12/16/2016   Debility 04/18/2012   Diarrhea 12/16/2016   DYSPNEA 12/19/2009   Edema 04/18/2012   ERECTILE DYSFUNCTION 01/25/2007   FATIGUE, CHRONIC 11/11/2009   Gout 07/10/2012   Heart murmur    History of kidney stones    "they passed" (07/05/2017)   Hyperkalemia 04/18/2012   Hyperlipidemia    Hypertension    HYPERTENSION 01/25/2007   Hypoxia 05/05/2011   Insomnia 05/13/2016   INSOMNIA, HX OF 01/25/2007   Knee pain, bilateral 12/28/2010   Knee pain, right 12/28/2010   NEUROMA 01/05/2010   OBESITY NOS 01/25/2007   OSA on CPAP 04/06/2010   PERIPHERAL NEUROPATHY 01/05/2010   PULMONARY  FUNCTION TESTS, ABNORMAL 02/02/2010   Sleep apnea    wears CPAP   Superficial thrombophlebitis of left leg 09/07/2011   TESTICULAR HYPOFUNCTION 01/05/2010   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   Toe pain, right 07/10/2012   Type II diabetes mellitus (HCC) 12/05/2009   UNSPECIFIED ANEMIA 01/05/2010    Past Surgical History:  Procedure Laterality Date   A-FLUTTER ABLATION N/A 05/16/2019   Procedure: A-FLUTTER ABLATION;  Surgeon: Regan Lemming, MD;  Location: [facility];  Service: Cardiovascular;  Laterality: N/A;   ATRIAL FIBRILLATION ABLATION N/A 05/16/2019   Procedure: ATRIAL FIBRILLATION ABLATION;  Surgeon: Regan Lemming, MD;  Location: [facility];  Service: Cardiovascular;  Laterality: N/A;   ATRIAL FIBRILLATION ABLATION N/A 03/10/2020   Procedure: ATRIAL FIBRILLATION ABLATION;  Surgeon: Regan Lemming, MD;  Location: [facility];  Service: Cardiovascular;  Laterality: N/A;   CARDIAC CATHETERIZATION  10/16/2009   nonischemic cardiomyopathy   CARDIOVERSION N/A 03/25/2015   Procedure: CARDIOVERSION;  Surgeon: Chrystie Nose, MD;  Location: [facility] ENDOSCOPY;  Service: Cardiovascular;  Laterality: N/A;   CARDIOVERSION N/A 06/17/2017   Procedure: CARDIOVERSION;  Surgeon: Thurmon Fair, MD;  Location: [facility] ENDOSCOPY;  Service: Cardiovascular;  Laterality: N/A;   CARDIOVERSION N/A 07/08/2017   Procedure: CARDIOVERSION;  Surgeon: Pricilla Riffle, MD;  Location: [facility] ENDOSCOPY;  Service: Cardiovascular;  Laterality: N/A;   CARDIOVERSION N/A 02/27/2019   Procedure: CARDIOVERSION;  Surgeon: Thurmon Fair, MD;  Location: [facility] ENDOSCOPY;  Service: Cardiovascular;  Laterality: N/A;   CARDIOVERSION N/A 08/16/2019   Procedure: CARDIOVERSION;  Surgeon: Thurmon Fair, MD;  Location: [facility] ENDOSCOPY;  Service: Cardiovascular;  Laterality: N/A;   COLONOSCOPY  03/10/2021   1st colonoscopy , Amada Jupiter, [facility], large transverse polyp, clip and spot applied   METATARSAL OSTEOTOMY  Bilateral ~ 1980   removed part of 5th metatarsal to corect curvature of toes    RIGHT HEART CATH N/A 05/16/2019   Procedure: RIGHT HEART CATH;  Surgeon: Dolores Patty, MD;  Location: [facility];  Service: Cardiovascular;  Laterality: N/A;   TEE WITHOUT CARDIOVERSION  05/16/2019   Procedure: Transesophageal Echocardiogram (Tee);  Surgeon: Regan Lemming, MD;  Location: [facility] INVASIVE CV LAB;  Service: Cardiovascular;;   TEE WITHOUT CARDIOVERSION N/A 03/07/2020   Procedure: TRANSESOPHAGEAL ECHOCARDIOGRAM (TEE);  Surgeon: Meriam Sprague, MD;  Location: [facility] ENDOSCOPY;  Service: Cardiovascular;  Laterality: N/A;    Family History  Problem Relation Age of Onset   Clotting disorder Mother    Heart disease Mother        s/p MI   Heart attack Mother    Hypertension Mother    Diabetes Mother    Hyperlipidemia Mother    Stroke Mother    Cancer Father        ? lung   Lung disease Father        smoker   Diabetes Sister    Hypertension Sister        smoker   Aneurysm Sister        brain   Other Sister        clipped   Seizures Sister        d/o w/aneurysm/ smoker   Leukemia Maternal Grandmother        ?   Colon cancer Neg Hx    Colon polyps Neg Hx    Stomach cancer Neg Hx    Rectal cancer Neg Hx    Esophageal cancer Neg Hx     Social History   Socioeconomic History   Marital status: Married    Spouse name: Not on file   Number of children: 4   Years of education: 12   Highest education level: High school graduate  Occupational History   Occupation: retired    Associate Professor: [employer]  Tobacco Use   Smoking status: Former    Current packs/day: 0.00    Average packs/day: 1 pack/day for 10.0 years (10.0 ttl pk-yrs)    Types: Cigarettes    Start date: 06/07/1970    Quit date: 06/07/1980    Years since quitting: 42.6   Smokeless tobacco: Never  Vaping Use   Vaping status: Never Used  Substance and Sexual Activity   Alcohol use: No   Drug use: No   Sexual  activity: Not Currently  Other Topics Concern   Not on file  Social History Narrative   Lives with male partner in a one story home.  His son lives there off and on.  Retired from [employer].  Education: high school.  Right handed   Social Determinants of Health   Financial Resource Strain: Not on file  Food Insecurity: Not on file  Transportation Needs: Not on file  Physical Activity: Not on file  Stress: Stress Concern Present (04/05/2022)   [employer] of Occupational Health - Occupational Stress Questionnaire    Feeling of Stress : To some extent  Social  Connections: Not on file  Intimate Partner Violence: Not on file    Outpatient Medications Prior to Visit  Medication Sig Dispense Refill   Accu-Chek Softclix Lancets lancets Use as instructed 200 each 3   albuterol (VENTOLIN HFA) 108 (90 Base) MCG/ACT inhaler Inhale 2 puffs into the lungs every 6 (six) hours as needed for wheezing or shortness of breath. 1 Inhaler 6   Alcohol Swabs (DROPSAFE ALCOHOL PREP) 70 % PADS USE AS DIRECTED 300 each 3   allopurinol (ZYLOPRIM) 100 MG tablet Take 1 tablet by mouth once daily 90 tablet 0   apixaban (ELIQUIS) 5 MG TABS tablet Take 1 tablet (5 mg total) by mouth 2 (two) times daily. 60 tablet 5   Blood Glucose Monitoring Suppl (ACCU-CHEK GUIDE ME) w/Device KIT USE TO TEST BLOOD SUGAR ONCE TO TWICE DAILY 1 kit 0   calcium carbonate (TUMS - DOSED IN MG ELEMENTAL CALCIUM) 500 MG chewable tablet Chew 500 mg by mouth daily as needed for indigestion or heartburn.     cetirizine (ZYRTEC) 10 MG tablet Take 1 tablet (10 mg total) by mouth daily. 30 tablet 11   clobetasol ointment (TEMOVATE) 0.05 % 1 application to affected area     Clobetasol Prop Emollient Base (CLOBETASOL PROPIONATE E) 0.05 % emollient cream Apply 1 application topically 2 (two) times daily as needed (insect bite). 60 g 1   colchicine (COLCRYS) 0.6 MG tablet Take one tablet for 4-6 days as needed for gout flare up. 15 tablet 3    fish oil-omega-3 fatty acids 1000 MG capsule Take 1 g by mouth daily.     gabapentin (NEURONTIN) 100 MG capsule Take 2 capsules (200 mg total) by mouth at bedtime. 180 capsule 3   glucose blood (ACCU-CHEK GUIDE) test strip USE TO TEST BLOOD SUGAR ONCE TO TWICE DAILY 200 each 1   hydrALAZINE (APRESOLINE) 10 MG tablet Take 1 tablet (10 mg total) by mouth 3 (three) times daily. 200 tablet 3   HYDROcodone-acetaminophen (NORCO) 10-325 MG tablet Take 1 tablet by mouth every 8 (eight) hours as needed for moderate pain or severe pain. 90 tablet 0   hydrocortisone 2.5 % ointment Apply topically 2 (two) times daily as needed. 30 g 1   isosorbide mononitrate (IMDUR) 30 MG 24 hr tablet Take 1 tablet (30 mg total) by mouth daily. 90 tablet 3   LORazepam (ATIVAN) 1 MG tablet TAKE 1 TABLET BY MOUTH EVERY 8 HOURS AS NEEDED FOR ANXIETY 30 tablet 0   losartan (COZAAR) 25 MG tablet Take 0.5 tablets (12.5 mg total) by mouth daily. 45 tablet 3   magnesium oxide (MAG-OX) 400 MG tablet Take 1 tablet (400 mg total) by mouth 2 (two) times daily. 30 tablet 11   metoprolol succinate (TOPROL-XL) 50 MG 24 hr tablet TAKE 1 TABLET BY MOUTH IN THE MORNING AND 1 AT BEDTIME WITH  OR  FOLLOWING  A  MEAL 180 tablet 1   Multiple Vitamin (MULTIVITAMIN WITH MINERALS) TABS tablet Take 1 tablet by mouth daily.      Polyethyl Glycol-Propyl Glycol (SYSTANE OP) Place 1 drop into both eyes daily as needed (dry eyes).     potassium chloride SA (KLOR-CON M20) 20 MEQ tablet Take 1 tablet (20 mEq total) by mouth as directed. 1 tablet daily 20 mEq by mouth as directed per alleviate research protocol. 90 tablet 3   pravastatin (PRAVACHOL) 40 MG tablet Take 1 tablet by mouth once daily 90 tablet 3   Semaglutide,0.25 or 0.5MG /DOS, (OZEMPIC, 0.25  OR 0.5 MG/DOSE,) 2 MG/1.5ML SOPN Inject 0.5 mg into the skin once a week. 4.5 mL 3   tamsulosin (FLOMAX) 0.4 MG CAPS capsule Take 1 capsule by mouth once daily 30 capsule 0   temazepam (RESTORIL) 30 MG  capsule Take 1 capsule (30 mg total) by mouth at bedtime. 30 capsule 3   tiZANidine (ZANAFLEX) 2 MG tablet Take 0.5-1 tablets (1-2 mg total) by mouth at bedtime as needed for muscle spasms. 30 tablet 1   VITAMIN D PO Take by mouth.     dapagliflozin propanediol (FARXIGA) 10 MG TABS tablet Take 1 tablet (10 mg total) by mouth daily. 30 tablet 3   furosemide (LASIX) 80 MG tablet Take 1 tablet (80 mg total) by mouth daily. As needed patient may take an additional 40 MG tablet as direct per Alleviate Research study PRN plan. 90 tablet 1   No facility-administered medications prior to visit.    Allergies  Allergen Reactions   Sulfonamide Derivatives Other (See Comments)    Flu like symptoms    Zolpidem Other (See Comments)    Excessive, prolonged sedation    Review of Systems  Constitutional:  Positive for malaise/fatigue. Negative for fever.  HENT:  Negative for congestion.   Eyes:  Negative for blurred vision.  Respiratory:  Negative for shortness of breath.   Cardiovascular:  Negative for chest pain, palpitations and leg swelling.  Gastrointestinal:  Positive for nausea. Negative for abdominal pain and blood in stool.  Genitourinary:  Negative for dysuria and frequency.  Musculoskeletal:  Negative for falls.  Skin:  Negative for rash.  Neurological:  Negative for dizziness, loss of consciousness and headaches.  Endo/Heme/Allergies:  Negative for environmental allergies.  Psychiatric/Behavioral:  Negative for depression. The patient is not nervous/anxious.        Objective:    Physical Exam  BP 122/70 (BP Location: Left Arm, Patient Position: Sitting, Cuff Size: Normal)   Pulse 92   Temp 97.6 F (36.4 C) (Oral)   Resp 16   Ht 5\' 11"  (1.803 m)   Wt 186 lb 3.2 oz (84.5 kg)   SpO2 100%   BMI 25.97 kg/m  Wt Readings from Last 3 Encounters:  01/20/23 186 lb 3.2 oz (84.5 kg)  01/11/23 185 lb 6.4 oz (84.1 kg)  12/27/22 185 lb (83.9 kg)    Diabetic Foot Exam - Simple   No  data filed    Lab Results  Component Value Date   WBC 4.3 01/20/2023   HGB 12.5 (L) 01/20/2023   HCT 39.3 01/20/2023   PLT 205.0 01/20/2023   GLUCOSE 136 (H) 01/21/2023   CHOL 144 01/20/2023   TRIG 92.0 01/20/2023   HDL 38.40 (L) 01/20/2023   LDLDIRECT 84.0 05/02/2018   LDLCALC 87 01/20/2023   ALT 10 01/20/2023   AST 12 01/20/2023   NA 138 01/21/2023   K 3.8 01/21/2023   CL 101 01/21/2023   CREATININE 1.57 (H) 01/21/2023   BUN 28 (H) 01/21/2023   CO2 26 01/21/2023   TSH 1.85 01/20/2023   PSA 16.25 (H) 01/20/2023   INR 2.2 09/06/2018   HGBA1C 6.5 01/20/2023   MICROALBUR 1.4 09/21/2022    Lab Results  Component Value Date   TSH 1.85 01/20/2023   Lab Results  Component Value Date   WBC 4.3 01/20/2023   HGB 12.5 (L) 01/20/2023   HCT 39.3 01/20/2023   MCV 81.3 01/20/2023   PLT 205.0 01/20/2023   Lab Results  Component Value Date  NA 138 01/21/2023   K 3.8 01/21/2023   CO2 26 01/21/2023   GLUCOSE 136 (H) 01/21/2023   BUN 28 (H) 01/21/2023   CREATININE 1.57 (H) 01/21/2023   BILITOT 0.5 01/20/2023   ALKPHOS 44 01/20/2023   AST 12 01/20/2023   ALT 10 01/20/2023   PROT 7.3 01/20/2023   ALBUMIN 4.1 01/20/2023   CALCIUM 9.2 01/21/2023   ANIONGAP 11 01/21/2023   EGFR 47 (L) 06/09/2022   GFR 39.09 (L) 01/20/2023   Lab Results  Component Value Date   CHOL 144 01/20/2023   Lab Results  Component Value Date   HDL 38.40 (L) 01/20/2023   Lab Results  Component Value Date   LDLCALC 87 01/20/2023   Lab Results  Component Value Date   TRIG 92.0 01/20/2023   Lab Results  Component Value Date   CHOLHDL 4 01/20/2023   Lab Results  Component Value Date   HGBA1C 6.5 01/20/2023       Assessment & Plan:  Atrial fibrillation, unspecified type (HCC) Assessment & Plan: Rate controlled and tolerating current meds, follows with cardiology  Orders: -     Lipid panel  Chronic renal impairment, unspecified CKD stage Assessment & Plan: Hydrate and monitor    Orders: -     Comprehensive metabolic panel  Chronic combined systolic and diastolic heart failure (HCC) Assessment & Plan: No recent exacerbation, follows with cards   Type 2 diabetes mellitus with diabetic chronic kidney disease, unspecified CKD stage, unspecified whether long term insulin use (HCC) Assessment & Plan: hgba1c acceptable, minimize simple carbs. Increase exercise as tolerated.   Orders: -     Lipid panel -     Hemoglobin A1c  Essential hypertension Assessment & Plan: Well controlled, no changes to meds. Encouraged heart healthy diet such as the DASH diet and exercise as tolerated.   Orders: -     Comprehensive metabolic panel -     CBC with Differential/Platelet -     TSH  Chronic gout without tophus, unspecified cause, unspecified site Assessment & Plan: Hydrate and monitor   Orders: -     Uric acid  PSA elevation Assessment & Plan: Patient follows with Dr Wilson Singer with [facility]. They do a DRE every 6 months or so. No palpable concerning lesions but his PSA continues to trend up. 8 to 13 to 15 to 19.2 in June 2024. They are now recommending serial CT scans. No new urinary symptoms. Patient does have some nocturia intermittently  Orders: -     PSA -     Urinalysis, Routine w reflex microscopic -     Urine Culture  Nocturia -     Urinalysis, Routine w reflex microscopic -     Urine Culture    Assessment and Plan    Thyroid Function Discussed the increasing trend in thyroid function tests and the need to monitor closely to decide on next steps. -Order comprehensive thyroid panel today.  Abdominal Discomfort Recent episode of abdominal discomfort. On examination, abdomen was soft and bowel sounds were normal. No constipation reported. -Collect urine sample today to rule out any urinary issues. -If symptoms persist or become a pattern, patient to notify the clinic.  Diabetes Discussed the importance of monitoring blood glucose  levels. -Order HbA1c today.  Follow-up in 4 months to review lab results and assess overall health status. Patient to contact clinic if any concerns arise before the scheduled follow-up.         Danise Edge, MD

## 2023-01-31 ENCOUNTER — Other Ambulatory Visit (INDEPENDENT_AMBULATORY_CARE_PROVIDER_SITE_OTHER): Payer: Medicare HMO

## 2023-01-31 DIAGNOSIS — I10 Essential (primary) hypertension: Secondary | ICD-10-CM | POA: Diagnosis not present

## 2023-01-31 DIAGNOSIS — N289 Disorder of kidney and ureter, unspecified: Secondary | ICD-10-CM

## 2023-01-31 DIAGNOSIS — N189 Chronic kidney disease, unspecified: Secondary | ICD-10-CM

## 2023-01-31 DIAGNOSIS — E1122 Type 2 diabetes mellitus with diabetic chronic kidney disease: Secondary | ICD-10-CM

## 2023-01-31 DIAGNOSIS — I4891 Unspecified atrial fibrillation: Secondary | ICD-10-CM

## 2023-01-31 LAB — COMPREHENSIVE METABOLIC PANEL
ALT: 10 U/L (ref 0–53)
AST: 14 U/L (ref 0–37)
Albumin: 3.7 g/dL (ref 3.5–5.2)
Alkaline Phosphatase: 48 U/L (ref 39–117)
BUN: 21 mg/dL (ref 6–23)
CO2: 32 mEq/L (ref 19–32)
Calcium: 9.6 mg/dL (ref 8.4–10.5)
Chloride: 103 mEq/L (ref 96–112)
Creatinine, Ser: 1.48 mg/dL (ref 0.40–1.50)
GFR: 47.79 mL/min — ABNORMAL LOW (ref >60.00)
Glucose, Bld: 99 mg/dL (ref 70–99)
Potassium: 4.9 mEq/L (ref 3.5–5.1)
Sodium: 142 mEq/L (ref 135–145)
Total Bilirubin: 0.5 mg/dL (ref 0.2–1.2)
Total Protein: 6.7 g/dL (ref 6.0–8.3)

## 2023-01-31 LAB — TSH: TSH: 1.68 u[IU]/mL (ref 0.35–5.50)

## 2023-01-31 LAB — CBC WITH DIFFERENTIAL/PLATELET
Basophils Absolute: 0.1 10*3/uL (ref 0.0–0.1)
Basophils Relative: 1.4 % (ref 0.0–3.0)
Eosinophils Absolute: 0.1 10*3/uL (ref 0.0–0.7)
Eosinophils Relative: 2.3 % (ref 0.0–5.0)
HCT: 38.5 % — ABNORMAL LOW (ref 39.0–52.0)
Hemoglobin: 12 g/dL — ABNORMAL LOW (ref 13.0–17.0)
Lymphocytes Relative: 35.8 % (ref 12.0–46.0)
Lymphs Abs: 1.7 10*3/uL (ref 0.7–4.0)
MCHC: 31.3 g/dL (ref 30.0–36.0)
MCV: 81.6 fl (ref 78.0–100.0)
Monocytes Absolute: 0.7 10*3/uL (ref 0.1–1.0)
Monocytes Relative: 14 % — ABNORMAL HIGH (ref 3.0–12.0)
Neutro Abs: 2.2 10*3/uL (ref 1.4–7.7)
Neutrophils Relative %: 46.5 % (ref 43.0–77.0)
Platelets: 261 10*3/uL (ref 150.0–400.0)
RBC: 4.71 Mil/uL (ref 4.22–5.81)
RDW: 16.6 % — ABNORMAL HIGH (ref 11.5–15.5)
WBC: 4.7 10*3/uL (ref 4.0–10.5)

## 2023-01-31 LAB — LIPID PANEL
Cholesterol: 135 mg/dL (ref 0–200)
HDL: 36 mg/dL — ABNORMAL LOW (ref >39.00)
LDL Cholesterol: 81 mg/dL (ref 0–99)
NonHDL: 98.9
Total CHOL/HDL Ratio: 4
Triglycerides: 92 mg/dL (ref 0.0–149.0)
VLDL: 18.4 mg/dL (ref 0.0–40.0)

## 2023-01-31 LAB — HEMOGLOBIN A1C: Hgb A1c MFr Bld: 6.5 % (ref 4.6–6.5)

## 2023-02-09 DIAGNOSIS — R972 Elevated prostate specific antigen [PSA]: Secondary | ICD-10-CM | POA: Diagnosis not present

## 2023-02-16 DIAGNOSIS — E291 Testicular hypofunction: Secondary | ICD-10-CM | POA: Diagnosis not present

## 2023-02-16 DIAGNOSIS — N401 Enlarged prostate with lower urinary tract symptoms: Secondary | ICD-10-CM | POA: Diagnosis not present

## 2023-02-16 DIAGNOSIS — R972 Elevated prostate specific antigen [PSA]: Secondary | ICD-10-CM | POA: Diagnosis not present

## 2023-02-16 DIAGNOSIS — R3915 Urgency of urination: Secondary | ICD-10-CM | POA: Diagnosis not present

## 2023-03-01 ENCOUNTER — Encounter: Payer: Self-pay | Admitting: Podiatry

## 2023-03-01 ENCOUNTER — Ambulatory Visit (INDEPENDENT_AMBULATORY_CARE_PROVIDER_SITE_OTHER): Payer: Medicare HMO | Admitting: Podiatry

## 2023-03-01 DIAGNOSIS — M2041 Other hammer toe(s) (acquired), right foot: Secondary | ICD-10-CM | POA: Diagnosis not present

## 2023-03-01 DIAGNOSIS — E119 Type 2 diabetes mellitus without complications: Secondary | ICD-10-CM | POA: Diagnosis not present

## 2023-03-01 DIAGNOSIS — E1142 Type 2 diabetes mellitus with diabetic polyneuropathy: Secondary | ICD-10-CM

## 2023-03-01 DIAGNOSIS — M2042 Other hammer toe(s) (acquired), left foot: Secondary | ICD-10-CM | POA: Diagnosis not present

## 2023-03-01 DIAGNOSIS — M2012 Hallux valgus (acquired), left foot: Secondary | ICD-10-CM

## 2023-03-01 DIAGNOSIS — M79675 Pain in left toe(s): Secondary | ICD-10-CM

## 2023-03-01 DIAGNOSIS — B351 Tinea unguium: Secondary | ICD-10-CM | POA: Diagnosis not present

## 2023-03-01 DIAGNOSIS — M79674 Pain in right toe(s): Secondary | ICD-10-CM | POA: Diagnosis not present

## 2023-03-01 DIAGNOSIS — M2011 Hallux valgus (acquired), right foot: Secondary | ICD-10-CM | POA: Diagnosis not present

## 2023-03-02 NOTE — Progress Notes (Signed)
ANNUAL DIABETIC FOOT EXAM  Subjective: [name] presents today annual diabetic foot exam.  Chief Complaint  Patient presents with   Diabetes    DFC BS - 114 A1C - 6.2 LVPCP -01/2023   Patient confirms h/o diabetes.  Patient denies any h/o foot wounds.  Patient has been diagnosed with neuropathy.  Kevin Canary, MD is patient's PCP.  Past Medical History:  Diagnosis Date   AORTIC STENOSIS, MODERATE 12/05/2009   Arthritis    Atrial fibrillation (HCC)    Atrial fibrillation with RVR (HCC) 07/05/2017   Atrial flutter (HCC) 12/05/2009   Atypical chest pain 11/08/2011   BENIGN PROSTATIC HYPERTROPHY, HX OF 12/05/2009   CHEST PAIN-UNSPECIFIED 11/11/2009   CHF 12/05/2009   Chronic renal insufficiency 12/16/2016   Debility 04/18/2012   Diarrhea 12/16/2016   DYSPNEA 12/19/2009   Edema 04/18/2012   ERECTILE DYSFUNCTION 01/25/2007   FATIGUE, CHRONIC 11/11/2009   Gout 07/10/2012   Heart murmur    History of kidney stones    "they passed" (07/05/2017)   Hyperkalemia 04/18/2012   Hyperlipidemia    Hypertension    HYPERTENSION 01/25/2007   Hypoxia 05/05/2011   Insomnia 05/13/2016   INSOMNIA, HX OF 01/25/2007   Knee pain, bilateral 12/28/2010   Knee pain, right 12/28/2010   NEUROMA 01/05/2010   OBESITY NOS 01/25/2007   OSA on CPAP 04/06/2010   PERIPHERAL NEUROPATHY 01/05/2010   PULMONARY FUNCTION TESTS, ABNORMAL 02/02/2010   Sleep apnea    wears CPAP   Superficial thrombophlebitis of left leg 09/07/2011   TESTICULAR HYPOFUNCTION 01/05/2010   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   Toe pain, right 07/10/2012   Type II diabetes mellitus (HCC) 12/05/2009   UNSPECIFIED ANEMIA 01/05/2010   Patient Active Problem List   Diagnosis Date Noted   PSA elevation 01/20/2023   DM (diabetes mellitus), type 2 with renal complications (HCC) 11/02/2022   Elevated coronary artery calcium score 02/27/2021   Intrinsic eczema 12/24/2020   Benign hypertensive kidney disease with chronic  kidney disease 12/24/2020   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   Rash 12/24/2020   Stage 3b chronic kidney disease (HCC) 12/24/2020   Chronic thumb pain, left 09/14/2020   Secondary hyperparathyroidism of renal origin (HCC) 01/02/2020   Secondary hypercoagulable state (HCC) 06/18/2019   Tremor 06/13/2019   Respiratory failure (HCC)    Persistent atrial fibrillation (HCC)    Chronic viral hepatitis B (HCC) 02/22/2019   Atrial fibrillation (HCC) 07/05/2017   Diarrhea 12/16/2016   Chronic renal insufficiency 12/16/2016   Preventative health care 11/10/2016   Insomnia 05/13/2016   Decreased visual acuity 02/29/2016   Pigmented skin lesions 01/10/2015   Dermatitis 09/23/2014   OSA (obstructive sleep apnea) 07/13/2014   Medicare annual wellness visit, subsequent 06/23/2014   Anxiety state 08/17/2013   Nonrheumatic aortic valve insufficiency 02/18/2013   Long term (current) use of anticoagulants 08/22/2012   Gout 07/10/2012   Hyperkalemia 04/18/2012   Edema 04/18/2012   Debility 04/18/2012   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   Superficial thrombophlebitis of left leg 09/07/2011   PAF (paroxysmal atrial fibrillation) (HCC) 08/02/2011   Hypoxia 05/05/2011   Knee pain, bilateral 12/28/2010   Hyperlipidemia 10/13/2010   NEUROMA 01/05/2010   TESTICULAR HYPOFUNCTION 01/05/2010   Anemia 01/05/2010   Hereditary and idiopathic peripheral neuropathy 01/05/2010   Type 2 diabetes mellitus with obesity (HCC) 12/05/2009   Chronic combined systolic and diastolic heart failure (HCC) 12/05/2009   BPH (benign prostatic hyperplasia) 12/05/2009   Fatigue 11/11/2009   CHEST PAIN-UNSPECIFIED  11/11/2009   Obesity 01/25/2007   ERECTILE DYSFUNCTION 01/25/2007   Essential hypertension 01/25/2007   History of other specified conditions presenting hazards to health 01/25/2007   Past Surgical History:  Procedure Laterality Date   A-FLUTTER ABLATION N/A 05/16/2019   Procedure: A-FLUTTER ABLATION;   Surgeon: Regan Lemming, MD;  Location: [facility];  Service: Cardiovascular;  Laterality: N/A;   ATRIAL FIBRILLATION ABLATION N/A 05/16/2019   Procedure: ATRIAL FIBRILLATION ABLATION;  Surgeon: Regan Lemming, MD;  Location: [facility];  Service: Cardiovascular;  Laterality: N/A;   ATRIAL FIBRILLATION ABLATION N/A 03/10/2020   Procedure: ATRIAL FIBRILLATION ABLATION;  Surgeon: Regan Lemming, MD;  Location: [facility];  Service: Cardiovascular;  Laterality: N/A;   CARDIAC CATHETERIZATION  10/16/2009   nonischemic cardiomyopathy   CARDIOVERSION N/A 03/25/2015   Procedure: CARDIOVERSION;  Surgeon: Chrystie Nose, MD;  Location: [facility] ENDOSCOPY;  Service: Cardiovascular;  Laterality: N/A;   CARDIOVERSION N/A 06/17/2017   Procedure: CARDIOVERSION;  Surgeon: Thurmon Fair, MD;  Location: [facility] ENDOSCOPY;  Service: Cardiovascular;  Laterality: N/A;   CARDIOVERSION N/A 07/08/2017   Procedure: CARDIOVERSION;  Surgeon: Pricilla Riffle, MD;  Location: [facility] ENDOS[facility];  Service: Cardiovascular;  Laterality: N/A;   CARDIOVERSION N/A 02/27/2019   Procedure: CARDIOVERSION;  Surgeon: Thurmon Fair, MD;  Location: [facility] ENDOSCOPY;  Service: Cardiovascular;  Laterality: N/A;   CARDIOVERSION N/A 08/16/2019   Procedure: CARDIOVERSION;  Surgeon: Thurmon Fair, MD;  Location: [facility] ENDOSCOPY;  Service: Cardiovascular;  Laterality: N/A;   COLONOSCOPY  03/10/2021   1st colonoscopy , Amada Jupiter, [facility], large transverse polyp, clip and spot applied   METATARSAL OSTEOTOMY Bilateral ~ 1980   removed part of 5th metatarsal to corect curvature of toes    RIGHT HEART CATH N/A 05/16/2019   Procedure: RIGHT HEART CATH;  Surgeon: Dolores Patty, MD;  Location: [facility];  Service: Cardiovascular;  Laterality: N/A;   TEE WITHOUT CARDIOVERSION  05/16/2019   Procedure: Transesophageal Echocardiogram (Tee);  Surgeon: Regan Lemming, MD;  Location: [facility] INVASIVE CV LAB;  Service:  Cardiovascular;;   TEE WITHOUT CARDIOVERSION N/A 03/07/2020   Procedure: TRANSESOPHAGEAL ECHOCARDIOGRAM (TEE);  Surgeon: Meriam Sprague, MD;  Location: [facility] ENDOSCOPY;  Service: Cardiovascular;  Laterality: N/A;   Current Outpatient Medications on File Prior to Visit  Medication Sig Dispense Refill   Accu-Chek Softclix Lancets lancets Use as instructed 200 each 3   albuterol (VENTOLIN HFA) 108 (90 Base) MCG/ACT inhaler Inhale 2 puffs into the lungs every 6 (six) hours as needed for wheezing or shortness of breath. 1 Inhaler 6   Alcohol Swabs (DROPSAFE ALCOHOL PREP) 70 % PADS USE AS DIRECTED 300 each 3   allopurinol (ZYLOPRIM) 100 MG tablet Take 1 tablet by mouth once daily 90 tablet 0   apixaban (ELIQUIS) 5 MG TABS tablet Take 1 tablet (5 mg total) by mouth 2 (two) times daily. 60 tablet 5   Blood Glucose Monitoring Suppl (ACCU-CHEK GUIDE ME) w/Device KIT USE TO TEST BLOOD SUGAR ONCE TO TWICE DAILY 1 kit 0   calcium carbonate (TUMS - DOSED IN MG ELEMENTAL CALCIUM) 500 MG chewable tablet Chew 500 mg by mouth daily as needed for indigestion or heartburn.     cefdinir (OMNICEF) 300 MG capsule Take 1 capsule (300 mg total) by mouth 2 (two) times daily. 10 capsule 0   cetirizine (ZYRTEC) 10 MG tablet Take 1 tablet (10 mg total) by mouth daily. 30 tablet 11   clobetasol ointment (  TEMOVATE) 0.05 % 1 application to affected area     Clobetasol Prop Emollient Base (CLOBETASOL PROPIONATE E) 0.05 % emollient cream Apply 1 application topically 2 (two) times daily as needed (insect bite). 60 g 1   colchicine (COLCRYS) 0.6 MG tablet Take one tablet for 4-6 days as needed for gout flare up. 15 tablet 3   dapagliflozin propanediol (FARXIGA) 10 MG TABS tablet Take 1 tablet (10 mg total) by mouth daily. 90 tablet 3   fish oil-omega-3 fatty acids 1000 MG capsule Take 1 g by mouth daily.     furosemide (LASIX) 80 MG tablet Take 1 tablet (80 mg total) by mouth daily. As needed patient may take an additional 40  MG tablet as direct per [employer] study PRN plan. 90 tablet 1   gabapentin (NEURONTIN) 100 MG capsule Take 2 capsules (200 mg total) by mouth at bedtime. 180 capsule 3   glucose blood (ACCU-CHEK GUIDE) test strip USE TO TEST BLOOD SUGAR ONCE TO TWICE DAILY 200 each 1   hydrALAZINE (APRESOLINE) 10 MG tablet Take 1 tablet (10 mg total) by mouth 3 (three) times daily. 200 tablet 3   HYDROcodone-acetaminophen (NORCO) 10-325 MG tablet Take 1 tablet by mouth every 8 (eight) hours as needed for moderate pain or severe pain. 90 tablet 0   hydrocortisone 2.5 % ointment Apply topically 2 (two) times daily as needed. 30 g 1   isosorbide mononitrate (IMDUR) 30 MG 24 hr tablet Take 1 tablet (30 mg total) by mouth daily. 90 tablet 3   LORazepam (ATIVAN) 1 MG tablet TAKE 1 TABLET BY MOUTH EVERY 8 HOURS AS NEEDED FOR ANXIETY 30 tablet 0   losartan (COZAAR) 25 MG tablet Take 0.5 tablets (12.5 mg total) by mouth daily. 45 tablet 3   magnesium oxide (MAG-OX) 400 MG tablet Take 1 tablet (400 mg total) by mouth 2 (two) times daily. 30 tablet 11   metoprolol succinate (TOPROL-XL) 50 MG 24 hr tablet TAKE 1 TABLET BY MOUTH IN THE MORNING AND 1 AT BEDTIME WITH  OR  FOLLOWING  A  MEAL 180 tablet 1   Multiple Vitamin (MULTIVITAMIN WITH MINERALS) TABS tablet Take 1 tablet by mouth daily.      Polyethyl Glycol-Propyl Glycol (SYSTANE OP) Place 1 drop into both eyes daily as needed (dry eyes).     potassium chloride SA (KLOR-CON M20) 20 MEQ tablet Take 1 tablet (20 mEq total) by mouth as directed. 1 tablet daily 20 mEq by mouth as directed per alleviate research protocol. 90 tablet 3   pravastatin (PRAVACHOL) 40 MG tablet Take 1 tablet by mouth once daily 90 tablet 3   Semaglutide,0.25 or 0.5MG /DOS, (OZEMPIC, 0.25 OR 0.5 MG/DOSE,) 2 MG/1.5ML SOPN Inject 0.5 mg into the skin once a week. 4.5 mL 3   tamsulosin (FLOMAX) 0.4 MG CAPS capsule Take 1 capsule by mouth once daily 30 capsule 0   temazepam (RESTORIL) 30 MG capsule  Take 1 capsule (30 mg total) by mouth at bedtime. 30 capsule 3   tiZANidine (ZANAFLEX) 2 MG tablet Take 0.5-1 tablets (1-2 mg total) by mouth at bedtime as needed for muscle spasms. 30 tablet 1   VITAMIN D PO Take by mouth.     No current facility-administered medications on file prior to visit.    Allergies  Allergen Reactions   Sulfonamide Derivatives Other (See Comments)    Flu like symptoms    Zolpidem Other (See Comments)    Excessive, prolonged sedation   Social History  Occupational History   Occupation: retired    Associate Professor: [employer]  Tobacco Use   Smoking status: Former    Current packs/day: 0.00    Average packs/day: 1 pack/day for 10.0 years (10.0 ttl pk-yrs)    Types: Cigarettes    Start date: 06/07/1970    Quit date: 06/07/1980    Years since quitting: 42.7   Smokeless tobacco: Never  Vaping Use   Vaping status: Never Used  Substance and Sexual Activity   Alcohol use: No   Drug use: No   Sexual activity: Not Currently   Family History  Problem Relation Age of Onset   Clotting disorder Mother    Heart disease Mother        s/p MI   Heart attack Mother    Hypertension Mother    Diabetes Mother    Hyperlipidemia Mother    Stroke Mother    Cancer Father        ? lung   Lung disease Father        smoker   Diabetes Sister    Hypertension Sister        smoker   Aneurysm Sister        brain   Other Sister        clipped   Seizures Sister        d/o w/aneurysm/ smoker   Leukemia Maternal Grandmother        ?   Colon cancer Neg Hx    Colon polyps Neg Hx    Stomach cancer Neg Hx    Rectal cancer Neg Hx    Esophageal cancer Neg Hx    Immunization History  Administered Date(s) Administered   Fluad Quad(high Dose 65+) 05/22/2020   Influenza Split 03/03/2011, 03/10/2012, 04/07/2018   Influenza Whole 03/28/2008, 03/30/2010   Influenza,inj,Quad PF,6+ Mos 01/31/2013, 03/07/2014, 04/14/2015, 02/19/2016, 03/18/2017   Influenza-Unspecified 04/05/2019,  05/15/2021   PFIZER(Purple Top)SARS-COV-2 Vaccination 08/03/2019, 08/21/2019, 04/18/2020, 09/18/2020   PNEUMOCOCCAL CONJUGATE-20 07/07/2021   Pfizer Covid-19 Vaccine Bivalent Booster 72yrs & up 05/20/2021   Pneumococcal Polysaccharide-23 10/07/2009   Td 09/05/2009     Review of Systems: Negative except as noted in the HPI.   Objective: There were no vitals filed for this visit.  AYVIN HARGRAVES is a pleasant 72 y.o. male in NAD. AAO X 3.  Vascular Examination: Capillary refill time immediate b/l. Vascular status intact b/l with palpable pedal pulses. Pedal hair present b/l. No pain with calf compression b/l. Skin temperature gradient WNL b/l. No cyanosis or clubbing b/l. No ischemia or gangrene noted b/l.   Neurological Examination: Sensation grossly intact b/l with 10 gram monofilament. Vibratory sensation intact b/l. Pt has subjective symptoms of neuropathy.  Dermatological Examination: Pedal skin with normal turgor, texture and tone b/l.  No open wounds. No interdigital macerations.   Toenails 1-5 b/l thick, discolored, elongated with subungual debris and pain on dorsal palpation.   No corns, calluses nor porokeratotic lesions noted.  Musculoskeletal Examination: Normal muscle strength 5/5 to all lower extremity muscle groups bilaterally. Hammertoe deformity noted 2-5 b/l.[name] No pain, crepitus or joint limitation noted with ROM b/l LE.  Patient ambulates independently without assistive aids.  Radiographs: None  Last A1c:      Latest Ref Rng & Units 01/31/2023   10:48 AM 01/20/2023   12:19 PM 09/21/2022    4:50 PM 05/04/2022   10:11 AM  Hemoglobin A1C  Hemoglobin-A1c 4.6 - 6.5 % 6.5  6.5  6.2  5.8  ADA Risk Categorization: Low Risk :  Patient has all of the following: Intact protective sensation No prior foot ulcer  No severe deformity Pedal pulses present  Assessment: 1. Pain due to onychomycosis of toenails of both feet   2. Hallux valgus, acquired, bilateral    3. Acquired hammertoes of both feet   4. Diabetic peripheral neuropathy associated with type 2 diabetes mellitus (HCC)   5. Encounter for diabetic foot exam (HCC)     Plan: -Consent given for treatment as described below: -Examined patient. -Diabetic foot examination performed today. -Patient to continue soft, supportive shoe gear daily. -Toenails 1-5 b/l were debrided in length and girth with sterile nail nippers and dremel without iatrogenic bleeding.  -Patient/POA to call should there be question/concern in the interim. No follow-ups on file.  [name], DPM

## 2023-03-04 ENCOUNTER — Other Ambulatory Visit (HOSPITAL_COMMUNITY): Payer: Self-pay | Admitting: Urology

## 2023-03-04 DIAGNOSIS — N402 Nodular prostate without lower urinary tract symptoms: Secondary | ICD-10-CM

## 2023-03-04 DIAGNOSIS — R972 Elevated prostate specific antigen [PSA]: Secondary | ICD-10-CM

## 2023-03-07 ENCOUNTER — Other Ambulatory Visit: Payer: Self-pay | Admitting: Cardiology

## 2023-03-07 DIAGNOSIS — I480 Paroxysmal atrial fibrillation: Secondary | ICD-10-CM

## 2023-03-07 NOTE — Telephone Encounter (Signed)
Eliquis 5mg  refill request received. Patient is 72 years old, weight-84.5kg, Crea-1.48 on 01/31/23, Diagnosis-Afib, and last seen by Dorena Cookey on 01/11/23. Dose is appropriate based on dosing criteria. Will send in refill to requested pharmacy.

## 2023-03-16 ENCOUNTER — Ambulatory Visit (HOSPITAL_COMMUNITY)
Admission: RE | Admit: 2023-03-16 | Discharge: 2023-03-16 | Disposition: A | Discharge status: Home / Self Care | Payer: Medicare HMO | Source: Ambulatory Visit | Attending: Urology | Admitting: Urology

## 2023-03-16 DIAGNOSIS — R972 Elevated prostate specific antigen [PSA]: Secondary | ICD-10-CM | POA: Diagnosis not present

## 2023-03-16 DIAGNOSIS — N402 Nodular prostate without lower urinary tract symptoms: Secondary | ICD-10-CM | POA: Insufficient documentation

## 2023-03-16 DIAGNOSIS — K5730 Diverticulosis of large intestine without perforation or abscess without bleeding: Secondary | ICD-10-CM | POA: Diagnosis not present

## 2023-03-16 DIAGNOSIS — N400 Enlarged prostate without lower urinary tract symptoms: Secondary | ICD-10-CM | POA: Diagnosis not present

## 2023-03-16 DIAGNOSIS — N4289 Other specified disorders of prostate: Secondary | ICD-10-CM | POA: Diagnosis not present

## 2023-03-16 DIAGNOSIS — D1809 Hemangioma of other sites: Secondary | ICD-10-CM | POA: Diagnosis not present

## 2023-03-16 MED ORDER — GADOBUTROL 1 MMOL/ML IV SOLN
8.0000 mL | Freq: Once | INTRAVENOUS | Status: AC | PRN
  Administered 2023-03-16: 8 mL via INTRAVENOUS

## 2023-03-17 ENCOUNTER — Other Ambulatory Visit: Payer: Self-pay | Admitting: Family Medicine

## 2023-03-17 NOTE — Telephone Encounter (Signed)
Requesting: lorazepam 1mg   Contract: 09/21/22 UDS: 09/21/22 Last Visit: 09/21/22 Next Visit: 05/23/23 Last Refill: 12/16/22 #30 and 0RF   Please Advise

## 2023-03-20 ENCOUNTER — Other Ambulatory Visit: Payer: Self-pay | Admitting: Family Medicine

## 2023-03-24 DIAGNOSIS — H40013 Open angle with borderline findings, low risk, bilateral: Secondary | ICD-10-CM | POA: Diagnosis not present

## 2023-03-24 LAB — HM DIABETES EYE EXAM

## 2023-05-08 ENCOUNTER — Other Ambulatory Visit: Payer: Self-pay | Admitting: Family Medicine

## 2023-05-10 ENCOUNTER — Encounter: Payer: Self-pay | Admitting: Internal Medicine

## 2023-05-10 ENCOUNTER — Ambulatory Visit: Payer: Medicare HMO | Admitting: Internal Medicine

## 2023-05-10 VITALS — BP 110/60 | HR 72 | Ht 71.0 in | Wt 189.8 lb

## 2023-05-10 DIAGNOSIS — E782 Mixed hyperlipidemia: Secondary | ICD-10-CM | POA: Diagnosis not present

## 2023-05-10 DIAGNOSIS — Z7984 Long term (current) use of oral hypoglycemic drugs: Secondary | ICD-10-CM | POA: Diagnosis not present

## 2023-05-10 DIAGNOSIS — E663 Overweight: Secondary | ICD-10-CM | POA: Diagnosis not present

## 2023-05-10 DIAGNOSIS — E1159 Type 2 diabetes mellitus with other circulatory complications: Secondary | ICD-10-CM

## 2023-05-10 DIAGNOSIS — Z7985 Long-term (current) use of injectable non-insulin antidiabetic drugs: Secondary | ICD-10-CM

## 2023-05-10 DIAGNOSIS — G63 Polyneuropathy in diseases classified elsewhere: Secondary | ICD-10-CM | POA: Diagnosis not present

## 2023-05-10 LAB — POCT GLYCOSYLATED HEMOGLOBIN (HGB A1C): Hemoglobin A1C: 5.7 % — AB (ref 4.0–5.6)

## 2023-05-10 MED ORDER — SEMAGLUTIDE(0.25 OR 0.5MG/DOS) 2 MG/3ML ~~LOC~~ SOPN: 0.5000 mg | PEN_INJECTOR | SUBCUTANEOUS | Status: DC

## 2023-05-10 NOTE — Progress Notes (Signed)
Patient ID: [name], male   DOB: 1951-01-02, 72 y.o.   MRN: [id number]  HPI: [name] is a 72 y.o.-year-old male, initially referred by his cardiologist, Dr. Royann Shivers, returning for follow-up DM2, dx in 2011, non-insulin-dependent, uncontrolled, with complications (CHF, CKD, ED, DR).  Last visit 6 months ago.  Interim hx: No increased urination, blurry vision, nausea, chest pain. He continues with a whole food plant-based diet, but relaxed his diet a little bit for the holidays.  Reviewed HbA1c levels Lab Results  Component Value Date   HGBA1C 6.5 01/31/2023   HGBA1C 6.5 01/20/2023   HGBA1C 6.2 09/21/2022   HGBA1C 5.8 (A) 05/04/2022   HGBA1C 5.8 (A) 10/29/2021   HGBA1C 5.9 (A) 06/30/2021   HGBA1C 8.2 (H) 03/06/2021   HGBA1C 7.4 (A) 02/24/2021   HGBA1C 6.9 (A) 10/21/2020   HGBA1C 6.3 (A) 07/22/2020   HGBA1C 8.1 (A) 04/18/2020   HGBA1C 6.6 (A) 12/14/2019   HGBA1C 5.3 09/14/2019   HGBA1C 6.4 06/11/2019   HGBA1C 5.8 02/06/2019   HGBA1C 5.7 08/03/2018   HGBA1C 7.0 (H) 05/02/2018   HGBA1C 6.7 (H) 01/24/2018   HGBA1C 6.2 10/21/2017   HGBA1C 7.2 (H) 03/18/2017   HGBA1C 7.0 (H) 11/15/2016   HGBA1C 7.5 (H) 08/20/2016   HGBA1C 6.8 (H) 05/21/2016   HGBA1C 6.7 (H) 02/18/2016   HGBA1C 6.4 11/17/2015   HGBA1C 6.4 07/15/2015   HGBA1C 6.6 (H) 04/14/2015   HGBA1C 6.1 01/07/2015   HGBA1C 6.8 (H) 09/13/2014   HGBA1C 6.8 (H) 06/13/2014   Pt is on a regimen of: - Farxiga 10 mg daily-started by Dr. Gala Romney - Ozempic 0.25 >> 0.5 mg weekly - started 03/29/2021 - through PAP Stopped Glyburide 02/2021.  He was not able to start Ozempic 0.5 mg- 04/2020 - due to price. He was on Metformin in the past.  Pt checks his sugars once a day: - am: 114-127, 141 >> 101-105 >> 116, 123 >> 106-130, 137, 152 - 2h after b'fast: n/c  - before lunch: n/c >> 99-126 >> 101-140 >> n/c - 2h after lunch: 224>> 93-124  >> n/c >> 146 >> n/c - before dinner: 178-227 >> n/c >> 91-113 >> n/c >>  99 - 2h after dinner: n/c >> 107-161 >> 94 >> n/c - bedtime:  154, 157, 200 >> n/c >> 105-119 >> 97-152 >> 112 - nighttime: n/c>> 128 >> n/c Lowest sugar was 60 >> ...  92 >> 106; he has hypoglycemia awareness in the 50s.  Highest sugar was 376 (cheese cake) >>...   129 >> 152   Glucometer: One Touch verio IQ  Pt's meals are: - Breakfast: oatmeal + berries/apples + cinnamon, cereal, fruit - Lunch: light meal - Dinner:  + veggies, occasionally fried foods (1x a mo) - Snacks:juice >> fruit  -+ CKD-sees Dr. Wynelle Link, last BUN/creatinine:  Lab Results  Component Value Date   BUN 21 01/31/2023   BUN 28 (H) 01/21/2023   CREATININE 1.48 01/31/2023   CREATININE 1.57 (H) 01/21/2023   Lab Results  Component Value Date   GFRAA 42 (L) 05/20/2020   GFRAA 38 (L) 02/25/2020   GFRAA 36 (L) 11/26/2019   GFRAA 39 (L) 11/12/2019   GFRAA 36 (L) 09/18/2019   GFRAA 37 (L) 08/30/2019   GFRAA 39 (L) 08/14/2019   GFRAA 45 (L) 07/11/2019   GFRAA 51 (L) 05/29/2019   GFRAA 58 (L) 05/28/2019  He is not on ACE inhibitor/ARB.  -+ HL; last set of lipids: Lab Results  Component Value Date   CHOL 135 01/31/2023   HDL 36.00 (L) 01/31/2023   LDLCALC 81 01/31/2023   LDLDIRECT 84.0 05/02/2018   TRIG 92.0 01/31/2023   CHOLHDL 4 01/31/2023  On pravastatin 40, omega-3 fatty acids.  - last eye exam was 03/2023: + DR reportedly. Also glaucoma. Dr. Sherryll Burger.  -+ numbness and tingling in his feet.Dr. Royann Shivers started him on Gabapentin 100 mg daily.  Currently on 100-200 mg daily.  Last foot exam 03/01/2023 (Dr. Eloy End)  Pt has FH of DM in mother, sister.  He is on amiodarone.  No FH of MTC. No personal hx of pancreatitis.  ROS: + see HPI  I reviewed pt's medications, allergies, PMH, social hx, family hx, and changes were documented in the history of present illness. Otherwise, unchanged from my initial visit note.  Past Medical History:  Diagnosis Date   AORTIC STENOSIS, MODERATE 12/05/2009    Arthritis    Atrial fibrillation (HCC)    Atrial fibrillation with RVR (HCC) 07/05/2017   Atrial flutter (HCC) 12/05/2009   Atypical chest pain 11/08/2011   BENIGN PROSTATIC HYPERTROPHY, HX OF 12/05/2009   CHEST PAIN-UNSPECIFIED 11/11/2009   CHF 12/05/2009   Chronic renal insufficiency 12/16/2016   Debility 04/18/2012   Diarrhea 12/16/2016   DYSPNEA 12/19/2009   Edema 04/18/2012   ERECTILE DYSFUNCTION 01/25/2007   FATIGUE, CHRONIC 11/11/2009   Gout 07/10/2012   Heart murmur    History of kidney stones    "they passed" (07/05/2017)   Hyperkalemia 04/18/2012   Hyperlipidemia    Hypertension    HYPERTENSION 01/25/2007   Hypoxia 05/05/2011   Insomnia 05/13/2016   INSOMNIA, HX OF 01/25/2007   Knee pain, bilateral 12/28/2010   Knee pain, right 12/28/2010   NEUROMA 01/05/2010   OBESITY NOS 01/25/2007   OSA on CPAP 04/06/2010   PERIPHERAL NEUROPATHY 01/05/2010   PULMONARY FUNCTION TESTS, ABNORMAL 02/02/2010   Sleep apnea    wears CPAP   Superficial thrombophlebitis of left leg 09/07/2011   TESTICULAR HYPOFUNCTION 01/05/2010   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   Toe pain, right 07/10/2012   Type II diabetes mellitus (HCC) 12/05/2009   UNSPECIFIED ANEMIA 01/05/2010   Past Surgical History:  Procedure Laterality Date   A-FLUTTER ABLATION N/A 05/16/2019   Procedure: A-FLUTTER ABLATION;  Surgeon: Regan Lemming, MD;  Location: [facility] INVASIVE CV LAB;  Service: Cardiovascular;  Laterality: N/A;   ATRIAL FIBRILLATION ABLATION N/A 05/16/2019   Procedure: ATRIAL FIBRILLATION ABLATION;  Surgeon: Regan Lemming, MD;  Location: [facility];  Service: Cardiovascular;  Laterality: N/A;   ATRIAL FIBRILLATION ABLATION N/A 03/10/2020   Procedure: ATRIAL FIBRILLATION ABLATION;  Surgeon: Regan Lemming, MD;  Location: [facility];  Service: Cardiovascular;  Laterality: N/A;   CARDIAC CATHETERIZATION  10/16/2009   nonischemic cardiomyopathy   CARDIOVERSION N/A 03/25/2015    Procedure: CARDIOVERSION;  Surgeon: Chrystie Nose, MD;  Location: [facility] ENDOSCOPY;  Service: Cardiovascular;  Laterality: N/A;   CARDIOVERSION N/A 06/17/2017   Procedure: CARDIOVERSION;  Surgeon: Thurmon Fair, MD;  Location: [facility] ENDOSCOPY;  Service: Cardiovascular;  Laterality: N/A;   CARDIOVERSION N/A 07/08/2017   Procedure: CARDIOVERSION;  Surgeon: Pricilla Riffle, MD;  Location: [facility] ENDOSCOPY;  Service: Cardiovascular;  Laterality: N/A;   CARDIOVERSION N/A 02/27/2019   Procedure: CARDIOVERSION;  Surgeon: Thurmon Fair, MD;  Location: [facility] ENDOSCOPY;  Service: Cardiovascular;  Laterality: N/A;   CARDIOVERSION N/A 08/16/2019   Procedure: CARDIOVERSION;  Surgeon: Thurmon Fair, MD;  Location: [facility] ENDOSCOPY;  Service: Cardiovascular;  Laterality: N/A;   COLONOSCOPY  03/10/2021   1st colonoscopy , Amada Jupiter, [facility], large transverse polyp, clip and spot applied   METATARSAL OSTEOTOMY Bilateral ~ 1980   removed part of 5th metatarsal to corect curvature of toes    RIGHT HEART CATH N/A 05/16/2019   Procedure: RIGHT HEART CATH;  Surgeon: Dolores Patty, MD;  Location: [facility]VAS[facility] CV LAB;  Service: Cardiovascular;  Laterality: N/A;   TEE WITHOUT CARDIOVERSION  05/16/2019   Procedure: Transesophageal Echocardiogram (Tee);  Surgeon: Regan Lemming, MD;  Location: [facility] INVASIVE CV LAB;  Service: Cardiovascular;;   TEE WITHOUT CARDIOVERSION N/A 03/07/2020   Procedure: TRANSESOPHAGEAL ECHOCARDIOGRAM (TEE);  Surgeon: Meriam Sprague, MD;  Location: [facility] ENDOSCOPY;  Service: Cardiovascular;  Laterality: N/A;   Social History   Socioeconomic History   Marital status: Married    Spouse name: Not on file   Number of children: 4   Years of education: 12   Highest education level: High school graduate  Occupational History   Occupation: retired    Associate Professor: [employer]  Tobacco Use   Smoking status: Former    Current packs/day: 0.00    Average packs/day: 1 pack/day for 10.0 years (10.0 ttl  pk-yrs)    Types: Cigarettes    Start date: 06/07/1970    Quit date: 06/07/1980    Years since quitting: 42.9   Smokeless tobacco: Never  Vaping Use   Vaping status: Never Used  Substance and Sexual Activity   Alcohol use: No   Drug use: No   Sexual activity: Not Currently  Other Topics Concern   Not on file  Social History Narrative   Lives with male partner in a one story home.  His son lives there off and on.  Retired from [employer].  Education: high school.  Right handed   Social Determinants of Health   Financial Resource Strain: Not on file  Food Insecurity: Not on file  Transportation Needs: Not on file  Physical Activity: Not on file  Stress: Stress Concern Present (04/05/2022)   [employer] of Occupational Health - Occupational Stress Questionnaire    Feeling of Stress : To some extent  Social Connections: Not on file  Intimate Partner Violence: Not on file   Current Outpatient Medications on File Prior to Visit  Medication Sig Dispense Refill   Accu-Chek Softclix Lancets lancets Use as instructed 200 each 3   albuterol (VENTOLIN HFA) 108 (90 Base) MCG/ACT inhaler Inhale 2 puffs into the lungs every 6 (six) hours as needed for wheezing or shortness of breath. 1 Inhaler 6   Alcohol Swabs (DROPSAFE ALCOHOL PREP) 70 % PADS USE AS DIRECTED 300 each 3   allopurinol (ZYLOPRIM) 100 MG tablet Take 1 tablet by mouth once daily 90 tablet 0   apixaban (ELIQUIS) 5 MG TABS tablet Take 1 tablet by mouth twice daily 60 tablet 5   Blood Glucose Monitoring Suppl (ACCU-CHEK GUIDE ME) w/Device KIT USE TO TEST BLOOD SUGAR ONCE TO TWICE DAILY 1 kit 0   calcium carbonate (TUMS - DOSED IN MG ELEMENTAL CALCIUM) 500 MG chewable tablet Chew 500 mg by mouth daily as needed for indigestion or heartburn.     cefdinir (OMNICEF) 300 MG capsule Take 1 capsule (300 mg total) by mouth 2 (two) times daily. 10 capsule 0   cetirizine (ZYRTEC) 10 MG tablet Take 1 tablet (10 mg total) by mouth daily. 30  tablet 11   clobetasol ointment (TEMOVATE) 0.05 % 1 application to  affected area     Clobetasol Prop Emollient Base (CLOBETASOL PROPIONATE E) 0.05 % emollient cream Apply 1 application topically 2 (two) times daily as needed (insect bite). 60 g 1   colchicine (COLCRYS) 0.6 MG tablet Take one tablet for 4-6 days as needed for gout flare up. 15 tablet 3   dapagliflozin propanediol (FARXIGA) 10 MG TABS tablet Take 1 tablet (10 mg total) by mouth daily. 90 tablet 3   fish oil-omega-3 fatty acids 1000 MG capsule Take 1 g by mouth daily.     furosemide (LASIX) 80 MG tablet Take 1 tablet (80 mg total) by mouth daily. As needed patient may take an additional 40 MG tablet as direct per Alleviate Research study PRN plan. 90 tablet 1   gabapentin (NEURONTIN) 100 MG capsule Take 2 capsules (200 mg total) by mouth at bedtime. 180 capsule 3   glucose blood (ACCU-CHEK GUIDE) test strip USE TO TEST BLOOD SUGAR ONCE TO TWICE DAILY 200 each 1   hydrALAZINE (APRESOLINE) 10 MG tablet Take 1 tablet (10 mg total) by mouth 3 (three) times daily. 200 tablet 3   HYDROcodone-acetaminophen (NORCO) 10-325 MG tablet Take 1 tablet by mouth every 8 (eight) hours as needed for moderate pain or severe pain. 90 tablet 0   hydrocortisone 2.5 % ointment Apply topically 2 (two) times daily as needed. 30 g 1   isosorbide mononitrate (IMDUR) 30 MG 24 hr tablet Take 1 tablet (30 mg total) by mouth daily. 90 tablet 3   LORazepam (ATIVAN) 1 MG tablet TAKE 1 TABLET BY MOUTH EVERY 8 HOURS AS NEEDED FOR ANXIETY 30 tablet 0   losartan (COZAAR) 25 MG tablet Take 0.5 tablets (12.5 mg total) by mouth daily. 45 tablet 3   magnesium oxide (MAG-OX) 400 MG tablet Take 1 tablet (400 mg total) by mouth 2 (two) times daily. 30 tablet 11   metoprolol succinate (TOPROL-XL) 50 MG 24 hr tablet TAKE 1 TABLET BY MOUTH IN THE MORNING AND 1 AT BEDTIME WITH  OR  FOLLOWING  A  MEAL 180 tablet 1   Multiple Vitamin (MULTIVITAMIN WITH MINERALS) TABS tablet Take 1  tablet by mouth daily.      Polyethyl Glycol-Propyl Glycol (SYSTANE OP) Place 1 drop into both eyes daily as needed (dry eyes).     potassium chloride SA (KLOR-CON M20) 20 MEQ tablet Take 1 tablet (20 mEq total) by mouth as directed. 1 tablet daily 20 mEq by mouth as directed per alleviate research protocol. 90 tablet 3   pravastatin (PRAVACHOL) 40 MG tablet Take 1 tablet by mouth once daily 90 tablet 3   Semaglutide,0.25 or 0.5MG /DOS, (OZEMPIC, 0.25 OR 0.5 MG/DOSE,) 2 MG/1.5ML SOPN Inject 0.5 mg into the skin once a week. 4.5 mL 3   tamsulosin (FLOMAX) 0.4 MG CAPS capsule Take 1 capsule by mouth once daily 30 capsule 0   temazepam (RESTORIL) 30 MG capsule Take 1 capsule by mouth at bedtime 30 capsule 0   tiZANidine (ZANAFLEX) 2 MG tablet Take 0.5-1 tablets (1-2 mg total) by mouth at bedtime as needed for muscle spasms. 30 tablet 1   VITAMIN D PO Take by mouth.     No current facility-administered medications on file prior to visit.   Allergies  Allergen Reactions   Sulfonamide Derivatives Other (See Comments)    Flu like symptoms    Zolpidem Other (See Comments)    Excessive, prolonged sedation   Family History  Problem Relation Age of Onset   Clotting disorder Mother  Heart disease Mother        s/p MI   Heart attack Mother    Hypertension Mother    Diabetes Mother    Hyperlipidemia Mother    Stroke Mother    Cancer Father        ? lung   Lung disease Father        smoker   Diabetes Sister    Hypertension Sister        smoker   Aneurysm Sister        brain   Other Sister        clipped   Seizures Sister        d/o w/aneurysm/ smoker   Leukemia Maternal Grandmother        ?   Colon cancer Neg Hx    Colon polyps Neg Hx    Stomach cancer Neg Hx    Rectal cancer Neg Hx    Esophageal cancer Neg Hx    PE: BP 110/60   Pulse 72   Ht 5\' 11"  (1.803 m)   Wt 189 lb 12.8 oz (86.1 kg)   SpO2 96%   BMI 26.47 kg/m  Wt Readings from Last 3 Encounters:  05/10/23 189 lb  12.8 oz (86.1 kg)  01/20/23 186 lb 3.2 oz (84.5 kg)  01/11/23 185 lb 6.4 oz (84.1 kg)   Constitutional: overweight, in NAD Eyes:  EOMI, no exophthalmos ENT: no neck masses, no cervical lymphadenopathy Cardiovascular: RRR, No MRG Respiratory: CTA B Musculoskeletal: no deformities Skin:no rashes Neurological: no tremor with outstretched hands  ASSESSMENT: 1. DM2, non-insulin-dependent, uncontrolled, with long-term complications - s and d CHF - A fib - s/p cardioversion - 05/2019 >> went into cardiac shock - CKD - sees nephrology - DR - ED  2.  Overweight  3. HL  4.  Peripheral neuropathy  PLAN:  1. Patient with longstanding, previously uncontrolled type 2 diabetes, with significant improvement in blood sugars on a regimen containing GLP-1 receptor agonist and SGLT2 inhibitor.  He obtains [place] and Ozempic through the patient assistance program.  He continues to stay active and is on a mostly whole food plant-based diet, with intermittent fasting.  At last visit, sugars were well-controlled without hyperglycemic spikes so we did not change his regimen.  His HbA1c at that time was slightly higher, at 6.5%, but still at goal. -At today's visit, he mostly check blood sugars in the morning and these are at goal with very few mild hyperglycemic exceptions.  We did discuss about checking some sugars later in the day but for now, especially in the light of his improving HbA1c, I advised him to continue the current regimen. - I suggested to:  Patient Instructions  Please continue: - Farxiga 10 mg daily - Ozempic 0.5 mg weekly  Please come back for a follow-up appointment in 6 months.  - we checked his HbA1c: 5.7% (lower) - advised to check sugars at different times of the day - 1x a day, rotating check times - advised for yearly eye exams >> he is UTD - return to clinic in 6 months  2.  Overweight -continue SGLT 2 inhibitor and GLP-1 receptor agonist which should also help with  weight loss -He lost 2 lb before last visit but gained 6 pounds since then  3. HL -Latest lipid panel was reviewed from 01/2023: LDL still above our goal of less than 55, HDL low, triglycerides at goal: Lab Results  Component Value Date   CHOL 135  01/31/2023   HDL 36.00 (L) 01/31/2023   LDLCALC 81 01/31/2023   LDLDIRECT 84.0 05/02/2018   TRIG 92.0 01/31/2023   CHOLHDL 4 01/31/2023  -He continues pravastatin 40 mg daily and omega-3 fatty acids without side effects  4.  Peripheral neuropathy -Most likely related to diabetes -On gabapentin 100 to 200 mg at bedtime, initially added by Dr. Royann Shivers -He continues to have numbness and tingling -He has diabetic shoes  Carlus Pavlov, MD PhD [facility]

## 2023-05-10 NOTE — Addendum Note (Signed)
Addended by: Pollie Meyer on: 05/10/2023 10:16 AM   Modules accepted: Orders

## 2023-05-10 NOTE — Patient Instructions (Signed)
Please continue: - Farxiga 10 mg daily - Ozempic 0.5 mg weekly  Please come back for a follow-up appointment in 6 months.

## 2023-05-12 ENCOUNTER — Other Ambulatory Visit: Payer: Self-pay | Admitting: Family

## 2023-05-13 ENCOUNTER — Telehealth: Payer: Self-pay

## 2023-05-13 NOTE — Telephone Encounter (Signed)
Sample  Medication:Ozempic  Dose: 0.25/0.5 mg Quantity: 2 boxes ION:[id number] EXP:08/04/24  Provided to patient at office visit.   Dicie Beam

## 2023-05-13 NOTE — Telephone Encounter (Signed)
Patient dropped off Patient Assistance Forms for NOVO Nordisk, paperwork placed in Dr. Charlean Sanfilippo folder

## 2023-05-17 NOTE — Telephone Encounter (Signed)
faxed

## 2023-05-18 DIAGNOSIS — E291 Testicular hypofunction: Secondary | ICD-10-CM | POA: Diagnosis not present

## 2023-05-18 DIAGNOSIS — R972 Elevated prostate specific antigen [PSA]: Secondary | ICD-10-CM | POA: Diagnosis not present

## 2023-05-18 NOTE — Telephone Encounter (Signed)
Requesting: Lorazepam (Ativan) 1 mg Contract: 10/22/2022 UDS: 09/21/2022 Last Visit: 01/20/2023 Next Visit: 06/27/2023 Last Refill:03/18/2023  Please Advise

## 2023-05-19 DIAGNOSIS — H524 Presbyopia: Secondary | ICD-10-CM | POA: Diagnosis not present

## 2023-05-23 ENCOUNTER — Telehealth: Payer: Medicare HMO | Admitting: Family Medicine

## 2023-05-24 ENCOUNTER — Ambulatory Visit: Payer: Medicare HMO | Admitting: Podiatry

## 2023-05-24 ENCOUNTER — Encounter: Payer: Self-pay | Admitting: Podiatry

## 2023-05-24 DIAGNOSIS — M79675 Pain in left toe(s): Secondary | ICD-10-CM | POA: Diagnosis not present

## 2023-05-24 DIAGNOSIS — B351 Tinea unguium: Secondary | ICD-10-CM | POA: Diagnosis not present

## 2023-05-24 DIAGNOSIS — E1142 Type 2 diabetes mellitus with diabetic polyneuropathy: Secondary | ICD-10-CM

## 2023-05-24 DIAGNOSIS — M79674 Pain in right toe(s): Secondary | ICD-10-CM

## 2023-05-24 NOTE — Progress Notes (Signed)
  Subjective:  Patient ID: [name], male    DOB: Dec 15, 1950,  MRN: [id number]  72 y.o. male presents to clinic with  at risk foot care with history of diabetic neuropathy and painful thick toenails that are difficult to trim. Pain interferes with ambulation. Aggravating factors include wearing enclosed shoe gear. Pain is relieved with periodic professional debridement.  Chief Complaint  Patient presents with   Diabetes    [facility] PATIENT STATES HE SAW HIS PCP WAS IN OCTOBER , PATIENT A1C IS 5.7    New problem(s): None   PCP is Bradd Canary, MD.  Allergies  Allergen Reactions   Sulfonamide Derivatives Other (See Comments)    Flu like symptoms    Zolpidem Other (See Comments)    Excessive, prolonged sedation   Review of Systems: Negative except as noted in the HPI.   Objective:  [name] is a pleasant 72 y.o. male WD, WN in NAD.[name] AAO x 3.  Vascular Examination: Vascular status intact b/l with palpable pedal pulses. CFT immediate b/l. No edema. No pain with calf compression b/l. Skin temperature gradient WNL b/l. Pedal hair present. No cyanosis or clubbing noted b/l LE.  Neurological Examination: Sensation grossly intact b/l with 10 gram monofilament. Vibratory sensation intact b/l. Pt has subjective symptoms of neuropathy.  Dermatological Examination: Pedal skin with normal turgor, texture and tone b/l. Toenails 1-5 b/l thick, discolored, elongated with subungual debris and pain on dorsal palpation. No hyperkeratotic lesions noted b/l.   Musculoskeletal Examination: Muscle strength 5/5 to b/l LE. Hammertoe deformity noted 2-5 b/l.  Radiographs: None  Last A1c:      Latest Ref Rng & Units 05/10/2023   10:16 AM 01/31/2023   10:48 AM 01/20/2023   12:19 PM 09/21/2022    4:50 PM  Hemoglobin A1C  Hemoglobin-A1c 4.0 - 5.6 % 5.7  6.5  6.5  6.2      Assessment:   1. Pain due to onychomycosis of toenails of both feet   2. Diabetic peripheral neuropathy associated  with type 2 diabetes mellitus (HCC)    Plan:  Patient was evaluated and treated. All patient's and/or POA's questions/concerns addressed on today's visit. Mycotic toenails 1-5 debrided in length and girth without incident. Continue soft, supportive shoe gear daily. Report any pedal injuries to medical professional. Call office if there are any quesitons/concerns. -Continue foot and shoe inspections daily. Monitor blood glucose per PCP/Endocrinologist's recommendations. -Patient/POA to call should there be question/concern in the interim.  Return in about 3 months (around 08/22/2023).  Freddie Breech, DPM      [facility] LOCATION: [address], [place] [address]                   Office [phone]   [facility] LOCATION: [address] [place], [place] [address] Office [phone]

## 2023-05-25 ENCOUNTER — Ambulatory Visit: Payer: Medicare HMO | Admitting: Cardiovascular Disease

## 2023-05-25 DIAGNOSIS — R351 Nocturia: Secondary | ICD-10-CM | POA: Diagnosis not present

## 2023-05-25 DIAGNOSIS — R972 Elevated prostate specific antigen [PSA]: Secondary | ICD-10-CM | POA: Diagnosis not present

## 2023-05-25 DIAGNOSIS — N401 Enlarged prostate with lower urinary tract symptoms: Secondary | ICD-10-CM | POA: Diagnosis not present

## 2023-05-25 DIAGNOSIS — R3915 Urgency of urination: Secondary | ICD-10-CM | POA: Diagnosis not present

## 2023-05-25 DIAGNOSIS — E291 Testicular hypofunction: Secondary | ICD-10-CM | POA: Diagnosis not present

## 2023-05-28 ENCOUNTER — Encounter: Payer: Self-pay | Admitting: Podiatry

## 2023-06-06 ENCOUNTER — Other Ambulatory Visit: Payer: Self-pay | Admitting: Family Medicine

## 2023-06-06 NOTE — Telephone Encounter (Signed)
Requesting: temazepam 30mg   Contract: 09/21/22 UDS: 09/21/22 Last Visit: 01/20/23 Next Visit: 06/27/23 Last Refill: 05/09/23 #30 and 0JW  Please Advise

## 2023-06-12 ENCOUNTER — Other Ambulatory Visit: Payer: Self-pay | Admitting: Family Medicine

## 2023-06-14 NOTE — Progress Notes (Signed)
   Electrophysiology Office Note:   Date:  06/15/2023  ID:  Kevin, [name] 04-06-1951, MRN [id number]  Primary Cardiologist: Jerel Balding, MD Electrophysiologist: Soyla Gladis Norton, MD      History of Present Illness:   [name] is a 73 y.o. male with h/o combined systolic and diastolic heart failure, PAF, AFL, Aortic regurge, Severe sleep Apnea, HTN, HLD, and DM2 seen today for routine electrophysiology followup.   Since last being seen in our clinic the patient reports doing well. Denies symptoms of atrial fibrillation. Inquires into loop removal as he is now out of Alleviate and no signal for interrogation today. Overall, he denies chest pain, palpitations, dyspnea, PND, orthopnea, nausea, vomiting, dizziness, syncope, edema, weight gain, or early satiety.   Review of systems complete and found to be negative unless listed in HPI.    Studies Reviewed:    EKG is not ordered today. EKG from 01/11/2023 reviewed which showed NSR at 76 bpm with a PVC       Device History: Medtronic loop recorder implanted 2020 for Atrial fibrillation  Echo 06/2022 LVEF 45-50%, grade 1 DD, normal RV, severely elevated PASP, Severe LAE, mild RAE, mild/mod MR, Mod/Severe AI, Mod PI.   Physical Exam:   VS:  BP 104/68 (BP Location: Right Arm, Patient Position: Sitting, Cuff Size: Normal)   Pulse 76   Resp 16   Ht 5' 11 (1.803 m)   Wt 191 lb 3.2 oz (86.7 kg)   SpO2 95%   BMI 26.67 kg/m    Wt Readings from Last 3 Encounters:  06/15/23 191 lb 3.2 oz (86.7 kg)  05/10/23 189 lb 12.8 oz (86.1 kg)  01/20/23 186 lb 3.2 oz (84.5 kg)     GEN: Well nourished, well developed in no acute distress NECK: No JVD; No carotid bruits CARDIAC: Regular rate and rhythm, no murmurs, rubs, gallops RESPIRATORY:  Clear to auscultation without rales, wheezing or rhonchi  ABDOMEN: Soft, non-tender, non-distended EXTREMITIES:  No edema; No deformity   ASSESSMENT AND PLAN:    Atrial fibrillation s/p  Medtronic Loop recorder Atrial flutter S/p ablation 05/2019 and repeat 03/10/2020 Out of alleviate and no signal for device interrogation today, EOS. He would like it removed, will discuss with team. He asks if it would be covered under Alleviate, which is not a questions I have previously come across.  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  Will discuss loop removal with team.   Signed, Ozell Prentice Passey, PA-C

## 2023-06-15 ENCOUNTER — Encounter: Payer: Self-pay | Admitting: Student

## 2023-06-15 ENCOUNTER — Ambulatory Visit: Payer: Medicare HMO | Attending: Student | Admitting: Student

## 2023-06-15 VITALS — BP 104/68 | HR 76 | Resp 16 | Ht 71.0 in | Wt 191.2 lb

## 2023-06-15 DIAGNOSIS — I10 Essential (primary) hypertension: Secondary | ICD-10-CM | POA: Diagnosis not present

## 2023-06-15 DIAGNOSIS — I5042 Chronic combined systolic (congestive) and diastolic (congestive) heart failure: Secondary | ICD-10-CM | POA: Diagnosis not present

## 2023-06-15 DIAGNOSIS — I480 Paroxysmal atrial fibrillation: Secondary | ICD-10-CM

## 2023-06-15 NOTE — Patient Instructions (Signed)
 Medication Instructions:  Your physician recommends that you continue on your current medications as directed. Please refer to the Current Medication list given to you today.  *If you need a refill on your cardiac medications before your next appointment, please call your pharmacy*  Lab Work: None ordered If you have labs (blood work) drawn today and your tests are completely normal, you will receive your results only by: MyChart Message (if you have MyChart) OR A paper copy in the mail If you have any lab test that is abnormal or we need to change your treatment, we will call you to review the results.  Follow-Up: At [facility], you and your health needs are our priority.  As part of our continuing mission to provide you with exceptional heart care, we have created designated Provider Care Teams.  These Care Teams include your primary Cardiologist (physician) and Advanced Practice Providers (APPs -  Physician Assistants and Nurse Practitioners) who all work together to provide you with the care you need, when you need it.  Your next appointment:   1 year(s)  Provider:   Loman Brooklyn, MD

## 2023-06-20 ENCOUNTER — Other Ambulatory Visit (HOSPITAL_COMMUNITY): Payer: Self-pay

## 2023-06-20 ENCOUNTER — Telehealth: Payer: Self-pay | Admitting: Pharmacy Technician

## 2023-06-20 ENCOUNTER — Ambulatory Visit: Payer: Medicare HMO | Attending: Cardiovascular Disease | Admitting: Cardiovascular Disease

## 2023-06-20 ENCOUNTER — Encounter: Payer: Self-pay | Admitting: Cardiovascular Disease

## 2023-06-20 VITALS — BP 118/62 | HR 70 | Ht 71.0 in | Wt 191.2 lb

## 2023-06-20 DIAGNOSIS — Z794 Long term (current) use of insulin: Secondary | ICD-10-CM

## 2023-06-20 DIAGNOSIS — E785 Hyperlipidemia, unspecified: Secondary | ICD-10-CM | POA: Diagnosis not present

## 2023-06-20 DIAGNOSIS — Z4509 Encounter for adjustment and management of other cardiac device: Secondary | ICD-10-CM

## 2023-06-20 DIAGNOSIS — D6869 Other thrombophilia: Secondary | ICD-10-CM

## 2023-06-20 DIAGNOSIS — N1832 Chronic kidney disease, stage 3b: Secondary | ICD-10-CM

## 2023-06-20 DIAGNOSIS — I10 Essential (primary) hypertension: Secondary | ICD-10-CM

## 2023-06-20 DIAGNOSIS — I351 Nonrheumatic aortic (valve) insufficiency: Secondary | ICD-10-CM

## 2023-06-20 DIAGNOSIS — I77810 Thoracic aortic ectasia: Secondary | ICD-10-CM

## 2023-06-20 DIAGNOSIS — G4733 Obstructive sleep apnea (adult) (pediatric): Secondary | ICD-10-CM

## 2023-06-20 DIAGNOSIS — E1122 Type 2 diabetes mellitus with diabetic chronic kidney disease: Secondary | ICD-10-CM

## 2023-06-20 DIAGNOSIS — I480 Paroxysmal atrial fibrillation: Secondary | ICD-10-CM | POA: Diagnosis not present

## 2023-06-20 DIAGNOSIS — I2721 Secondary pulmonary arterial hypertension: Secondary | ICD-10-CM

## 2023-06-20 DIAGNOSIS — I5042 Chronic combined systolic (congestive) and diastolic (congestive) heart failure: Secondary | ICD-10-CM | POA: Diagnosis not present

## 2023-06-20 MED ORDER — DAPAGLIFLOZIN PROPANEDIOL 10 MG PO TABS: 10.0000 mg | ORAL_TABLET | Freq: Every day | ORAL | Status: DC

## 2023-06-20 MED ORDER — APIXABAN 5 MG PO TABS: 5.0000 mg | ORAL_TABLET | Freq: Two times a day (BID) | ORAL | Status: DC

## 2023-06-20 NOTE — Patient Instructions (Signed)

## 2023-06-20 NOTE — Telephone Encounter (Signed)
 Received staff message: From: Davee Comer CROME, RN Sent: 06/20/2023   1:07 PM EST To: Rx Prior Auth Team Subject: Needs Farxiga  PA please                        Needs Farxiga  PA please. Thank you  Pharmacy Patient Advocate Encounter   Received notification from Staff Messages that prior authorization for Farxiga  is required/requested.   Insurance verification completed.   The patient is insured through [place] .   Per test claim: The current 30 day co-pay is, $391.32-due to deductible.  No PA needed at this time. This test claim was processed through [facility]- copay amounts may vary at other pharmacies due to pharmacy/plan contracts, or as the patient moves through the different stages of their insurance plan.      I wrote staff message back.

## 2023-06-20 NOTE — Progress Notes (Signed)
 Cardiology Office Note    Date:  06/20/2023   ID:  Kyce, Ging May 16, 1951, MRN [id number]  PCP:  Domenica Harlene LABOR, MD  Cardiologist:   Jerel Balding, MD   Chief Complaint  Patient presents with   Congestive Heart Failure    History of Present Illness:  [name] is a 73 y.o. male with combined systolic and diastolic heart failure, history of paroxysmal atrial flutter and paroxysmal atrial fibrillation status post re-do endocardial ablation (Dr. Inocencio, August 2021), history of recurrent tachycardia cardiomyopathy, moderate aortic insufficiency, longstanding severe obstructive sleep apnea compliant with CPAP, essential hypertension, dyslipidemia, type 2 diabetes mellitus, history of obesity with substantial success at weight loss.  He continues to be quite well.  He is going on a little bit of winter weight, but has not had problems with lower extremity edema, orthopnea, PND, exertional dyspnea, chest pain, palpitations, dizziness or syncope.  Denies any bleeding problems on Eliquis .  Keeps a conscientious log of his heart rate, blood pressure and weight which have all been pretty steady.  Our office scale today shows 191 pounds and at home he typically weighs about 3 pounds less than that.  His loop recorder battery has reached end of service.  He would like to have it extracted.  His blood pressure and renal function have limited use of heart failure medications, he is on a low-dose of hydralazine /long-acting nitrates and the very low-dose of losartan .  He is on a higher dose of metoprolol  succinate that also helps protect from episodes of rapid ventricular rates.  He is on Farxiga  which also helps for management of his diabetes mellitus for which she is also taking semaglutide .    Metabolic control is good.  His most recent hemoglobin A1c was 5.7%, HDL 36 and LDL 81.  His most recent creatinine actually showed an improving trend, down to 1.48, estimated GFR 48.  His  most recent echocardiogram in January 2024 showed LVEF of 45-50% with global hypokinesis and mild LVH.  Doppler parameters show a markedly abnormal E/A ratio of 4.6: 1 and elevated E/e' ratio (16-26), which would be consistent with a restrictive filling, but may be due to left atrial mechanical failure.  The left atrium is severely dilated at ESD 5.5 cm / ESVI 67 mL/meters squaredBSA>.  The ascending aortic maximum diameter remains unchanged at 4.3 cm, the aortic insufficiency remains moderate.  The right ventricle is mildly dilated at 4.5 cm with normal function.  The estimated systolic PA pressure is high at 61 mmHg.  The inferior vena cava was not dilated.   He was very ill at the end of 2020 with severe tachycardia related cardiomyopathy, severely depressed left ventricular systolic function in the setting of atrial flutter with rapid ventricular rates.  He underwent ablation with Will Camnitz, transition from dofetilide  to amiodarone  and then was hospitalized for optimization of his heart failure.  A loop recorder was implanted as part of a CHF trial.  He has never really been aware of the palpitations, so episodes of breakthrough arrhythmia have often gone undetected in the past.  Because of a 2-week episode of persistent atrial fibrillation detected by his loop recorder, he underwent cardioversion successfully on March 11.  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  Most recently in December 2020 he had cardiogenic shock and an EF of 10%, underwent pulmonary vein isolation and had a prolonged hospitalization on pressors.  Follow-up echo in February 2021 showed EF  of 35-40%.  Prior to that in January 2019 his ejection fraction dropped to 30-35% when he was again unaware of the persistent arrhythmia.  He underwent cardioversion and ranolazine  was added to dofetilide  with a return to sinus rhythm, improved functional status and improvement in left ventricular  ejection fraction to 42% by echo performed in April 2019  In early 2020 he had an upper respiratory infection from which he never felt that he fully rebounded.  He noticed persistent tachycardia throughout the summer 2020 he underwent successful cardioversion on February 27, 2019 but had early recurrence of atrial fibrillation despite increased dose of dofetilide .  His clinical status steadily deteriorated.  On May 16, 2019 TEE showed LVEF was down to 10%, with severe biatrial dilation and moderate aortic insufficiency, mild mitral insufficiency.  The same day he underwent A. fib/a flutter ablation by Dr. Inocencio and a right heart catheterization by Dr. Cherrie (RA pressure 15, PA pressure 49/32, wedge pressure mean 27, cardiac index 2.8).  Follow-up echocardiogram in February 2021 showed EF of 35-40%.  He did have another cardioversion in early March for persistent atrial flutter.  His echocardiograms have shown moderate aortic insufficiency related to aortic ectasia.  He has a long-standing history of severe hypertension. He also has long-standing history of obstructive sleep apnea which was also not treated until recently, but he is now 100% CPAP-compliant. Coronary angiography 2011 did not show evidence of significant stenoses. He presented with typical atrial flutter in 2011 and has recurrent paroxysmal atrial fibrillation with a good response to treatment with dofetilide , but the dose of dofetilide  was decreased when his renal function deteriorated in 2020. Additional problems include obesity, type 2 diabetes mellitus, gout and androgen deficiency. He had a successful cardioversion in October 2016 and February 2019 for persistent atrial fibrillation.   Past Medical History:  Diagnosis Date   AORTIC STENOSIS, MODERATE 12/05/2009   Arthritis    Atrial fibrillation (HCC)    Atrial fibrillation with RVR (HCC) 07/05/2017   Atrial flutter (HCC) 12/05/2009   Atypical chest pain 11/08/2011   BENIGN  PROSTATIC HYPERTROPHY, HX OF 12/05/2009   CHEST PAIN-UNSPECIFIED 11/11/2009   CHF 12/05/2009   Chronic renal insufficiency 12/16/2016   Debility 04/18/2012   Diarrhea 12/16/2016   DYSPNEA 12/19/2009   Edema 04/18/2012   ERECTILE DYSFUNCTION 01/25/2007   FATIGUE, CHRONIC 11/11/2009   Gout 07/10/2012   Heart murmur    History of kidney stones    they passed (07/05/2017)   Hyperkalemia 04/18/2012   Hyperlipidemia    Hypertension    HYPERTENSION 01/25/2007   Hypoxia 05/05/2011   Insomnia 05/13/2016   INSOMNIA, HX OF 01/25/2007   Knee pain, bilateral 12/28/2010   Knee pain, right 12/28/2010   NEUROMA 01/05/2010   OBESITY NOS 01/25/2007   OSA on CPAP 04/06/2010   PERIPHERAL NEUROPATHY 01/05/2010   PULMONARY FUNCTION TESTS, ABNORMAL 02/02/2010   Sleep apnea    wears CPAP   Superficial thrombophlebitis of left leg 09/07/2011   TESTICULAR HYPOFUNCTION 01/05/2010   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   Toe pain, right 07/10/2012   Type II diabetes mellitus (HCC) 12/05/2009   UNSPECIFIED ANEMIA 01/05/2010    Past Surgical History:  Procedure Laterality Date   A-FLUTTER ABLATION N/A 05/16/2019   Procedure: A-FLUTTER ABLATION;  Surgeon: Inocencio Soyla Lunger, MD;  Location: [facility] INVASIVE CV LAB;  Service: Cardiovascular;  Laterality: N/A;   ATRIAL FIBRILLATION ABLATION N/A 05/16/2019   Procedure: ATRIAL FIBRILLATION ABLATION;  Surgeon: Inocencio Soyla Lunger, MD;  Location: [facility]  INVASIVE CV LAB;  Service: Cardiovascular;  Laterality: N/A;   ATRIAL FIBRILLATION ABLATION N/A 03/10/2020   Procedure: ATRIAL FIBRILLATION ABLATION;  Surgeon: Inocencio Soyla Lunger, MD;  Location: [facility] INVASIVE CV LAB;  Service: Cardiovascular;  Laterality: N/A;   CARDIAC CATHETERIZATION  10/16/2009   nonischemic cardiomyopathy   CARDIOVERSION N/A 03/25/2015   Procedure: CARDIOVERSION;  Surgeon: Vinie JAYSON Maxcy, MD;  Location: [facility] ENDOSCOPY;  Service: Cardiovascular;  Laterality: N/A;   CARDIOVERSION N/A 06/17/2017    Procedure: CARDIOVERSION;  Surgeon: Francyne Headland, MD;  Location: [facility] ENDOSCOPY;  Service: Cardiovascular;  Laterality: N/A;   CARDIOVERSION N/A 07/08/2017   Procedure: CARDIOVERSION;  Surgeon: Okey Vina GAILS, MD;  Location: [facility] ENDOSCOPY;  Service: Cardiovascular;  Laterality: N/A;   CARDIOVERSION N/A 02/27/2019   Procedure: CARDIOVERSION;  Surgeon: Francyne Headland, MD;  Location: [facility] ENDOSCOPY;  Service: Cardiovascular;  Laterality: N/A;   CARDIOVERSION N/A 08/16/2019   Procedure: CARDIOVERSION;  Surgeon: Francyne Headland, MD;  Location: [facility] ENDOSCOPY;  Service: Cardiovascular;  Laterality: N/A;   COLONOSCOPY  03/10/2021   1st colonoscopy , [facility], [facility], large transverse polyp, clip and spot applied   METATARSAL OSTEOTOMY Bilateral ~ 1980   removed part of 5th metatarsal to corect curvature of toes    RIGHT HEART CATH N/A 05/16/2019   Procedure: RIGHT HEART CATH;  Surgeon: Cherrie Toribio SAUNDERS, MD;  Location: [facility] INVASIVE CV LAB;  Service: Cardiovascular;  Laterality: N/A;   TEE WITHOUT CARDIOVERSION  05/16/2019   Procedure: Transesophageal Echocardiogram (Tee);  Surgeon: Inocencio Soyla Lunger, MD;  Location: [facility] INVASIVE CV LAB;  Service: Cardiovascular;;   TEE WITHOUT CARDIOVERSION N/A 03/07/2020   Procedure: TRANSESOPHAGEAL ECHOCARDIOGRAM (TEE);  Surgeon: Hobart Powell BRAVO, MD;  Location: [facility] ENDOSCOPY;  Service: Cardiovascular;  Laterality: N/A;    Current Medications: Outpatient Medications Prior to Visit  Medication Sig Dispense Refill   Accu-Chek Softclix Lancets lancets Use as instructed 200 each 3   Alcohol  Swabs  (DROPSAFE ALCOHOL  PREP) 70 % PADS USE AS DIRECTED 300 each 3   allopurinol  (ZYLOPRIM ) 100 MG tablet Take 1 tablet by mouth once daily 90 tablet 0   apixaban  (ELIQUIS ) 5 MG TABS tablet Take 1 tablet by mouth twice daily 60 tablet 5   Blood Glucose Monitoring Suppl (ACCU-CHEK GUIDE ME) w/Device KIT USE TO TEST BLOOD SUGAR ONCE TO TWICE DAILY 1 kit 0   calcium  carbonate  (TUMS - DOSED IN MG ELEMENTAL CALCIUM ) 500 MG chewable tablet Chew 500 mg by mouth daily as needed for indigestion or heartburn.     cefdinir  (OMNICEF ) 300 MG capsule Take 1 capsule (300 mg total) by mouth 2 (two) times daily. 10 capsule 0   cetirizine  (ZYRTEC ) 10 MG tablet Take 1 tablet (10 mg total) by mouth daily. 30 tablet 11   clobetasol  ointment (TEMOVATE ) 0.05 % 1 application to affected area     dapagliflozin  propanediol (FARXIGA ) 10 MG TABS tablet Take 1 tablet (10 mg total) by mouth daily. 90 tablet 3   fish oil-omega-3 fatty acids  1000 MG capsule Take 1 g by mouth daily.     furosemide  (LASIX ) 80 MG tablet Take 1 tablet (80 mg total) by mouth daily. As needed patient may take an additional 40 MG tablet as direct per [employer] study PRN plan. 90 tablet 1   gabapentin  (NEURONTIN ) 100 MG capsule Take 2 capsules (200 mg total) by mouth at bedtime. 180 capsule 3   glucose blood (ACCU-CHEK GUIDE) test strip USE TO TEST BLOOD SUGAR ONCE TO TWICE DAILY 200  each 1   hydrALAZINE  (APRESOLINE ) 10 MG tablet Take 1 tablet (10 mg total) by mouth 3 (three) times daily. 200 tablet 3   isosorbide  mononitrate (IMDUR ) 30 MG 24 hr tablet Take 1 tablet (30 mg total) by mouth daily. 90 tablet 3   LORazepam  (ATIVAN ) 1 MG tablet TAKE 1 TABLET BY MOUTH EVERY 8 HOURS AS NEEDED FOR ANXIETY 30 tablet 0   magnesium  oxide (MAG-OX) 400 MG tablet Take 1 tablet (400 mg total) by mouth 2 (two) times daily. 30 tablet 11   metoprolol  succinate (TOPROL -XL) 50 MG 24 hr tablet TAKE 1 TABLET BY MOUTH IN THE MORNING AND 1 AT BEDTIME WITH  OR  FOLLOWING  A  MEAL 180 tablet 1   Multiple Vitamin (MULTIVITAMIN WITH MINERALS) TABS tablet Take 1 tablet by mouth daily.      potassium chloride  SA (KLOR-CON  M20) 20 MEQ tablet Take 1 tablet (20 mEq total) by mouth as directed. 1 tablet daily 20 mEq by mouth as directed per [employer] research protocol. 90 tablet 3   pravastatin  (PRAVACHOL ) 40 MG tablet Take 1 tablet by mouth once  daily 90 tablet 3   Semaglutide ,0.25 or 0.5MG /DOS, 2 MG/3ML SOPN Inject 0.5 mg into the skin once a week.     tamsulosin  (FLOMAX ) 0.4 MG CAPS capsule Take 1 capsule by mouth once daily 30 capsule 0   temazepam  (RESTORIL ) 30 MG capsule Take 1 capsule by mouth at bedtime 30 capsule 0   VITAMIN D PO Take by mouth.     albuterol  (VENTOLIN  HFA) 108 (90 Base) MCG/ACT inhaler Inhale 2 puffs into the lungs every 6 (six) hours as needed for wheezing or shortness of breath. (Patient not taking: Reported on 06/20/2023) 1 Inhaler 6   Clobetasol  Prop Emollient Base (CLOBETASOL  PROPIONATE E) 0.05 % emollient cream Apply 1 application topically 2 (two) times daily as needed (insect bite). (Patient not taking: Reported on 06/20/2023) 60 g 1   colchicine  (COLCRYS ) 0.6 MG tablet Take one tablet for 4-6 days as needed for gout flare up. (Patient not taking: Reported on 06/20/2023) 15 tablet 3   HYDROcodone -acetaminophen  (NORCO) 10-325 MG tablet Take 1 tablet by mouth every 8 (eight) hours as needed for moderate pain or severe pain. (Patient not taking: Reported on 06/20/2023) 90 tablet 0   hydrocortisone  2.5 % ointment Apply topically 2 (two) times daily as needed. (Patient not taking: Reported on 06/20/2023) 30 g 1   losartan  (COZAAR ) 25 MG tablet Take 0.5 tablets (12.5 mg total) by mouth daily. 45 tablet 3   Polyethyl Glycol-Propyl Glycol (SYSTANE OP) Place 1 drop into both eyes daily as needed (dry eyes). (Patient not taking: Reported on 06/20/2023)     tiZANidine  (ZANAFLEX ) 2 MG tablet Take 0.5-1 tablets (1-2 mg total) by mouth at bedtime as needed for muscle spasms. (Patient not taking: Reported on 06/20/2023) 30 tablet 1   No facility-administered medications prior to visit.     Allergies:   Sulfonamide derivatives and Zolpidem    Social History   Socioeconomic History   Marital status: Married    Spouse name: Not on file   Number of children: 4   Years of education: 12   Highest education level: High school  graduate  Occupational History   Occupation: retired    Associate Professor: [employer]  Tobacco Use   Smoking status: Former    Current packs/day: 0.00    Average packs/day: 1 pack/day for 10.0 years (10.0 ttl pk-yrs)    Types: Cigarettes  Start date: 06/07/1970    Quit date: 06/07/1980    Years since quitting: 43.0   Smokeless tobacco: Never  Vaping Use   Vaping status: Never Used  Substance and Sexual Activity   Alcohol  use: No   Drug use: No   Sexual activity: Not Currently  Other Topics Concern   Not on file  Social History Narrative   Lives with male partner in a one story home.  His son lives there off and on.  Retired from [employer].  Education: high school.  Right handed   Social Drivers of Health   Financial Resource Strain: Not on file  Food Insecurity: Not on file  Transportation Needs: Not on file  Physical Activity: Not on file  Stress: Stress Concern Present (04/05/2022)   [employer] of Occupational Health - Occupational Stress Questionnaire    Feeling of Stress : To some extent  Social Connections: Not on file     Family History:  The patient's family history includes Aneurysm in his sister; Cancer in his father; Clotting disorder in his mother; Diabetes in his mother and sister; Heart attack in his mother; Heart disease in his mother; Hyperlipidemia in his mother; Hypertension in his mother and sister; Leukemia in his maternal grandmother; Lung disease in his father; Other in his sister; Seizures in his sister; Stroke in his mother.   ROS:   Please see the history of present illness.    ROS All other systems are reviewed and are negative.  PHYSICAL EXAM:   VS:  BP 118/62 (BP Location: Left Arm, Patient Position: Sitting, Cuff Size: Normal)   Pulse 70   Ht 5' 11 (1.803 m)   Wt 191 lb 3.2 oz (86.7 kg)   SpO2 98%   BMI 26.67 kg/m       General: Alert, oriented x3, no distress, mildly overweight Head: no evidence of trauma, PERRL, EOMI, no exophtalmos or  lid lag, no myxedema, no xanthelasma; normal ears, nose and oropharynx Neck: normal jugular venous pulsations and no hepatojugular reflux; brisk carotid pulses without delay and no carotid bruits Chest: clear to auscultation, no signs of consolidation by percussion or palpation, normal fremitus, symmetrical and full respiratory excursions Cardiovascular: normal position and quality of the apical impulse, regular rhythm, normal first and second heart sounds, no rubs or gallops, 3/6 aortic regurgitation decrescendo diastolic murmur heard best at the right lower sternal border, 2/6 aortic ejection murmur heard best at the right upper sternal border Abdomen: no tenderness or distention, no masses by palpation, no abnormal pulsatility or arterial bruits, normal bowel sounds, no hepatosplenomegaly Extremities: no clubbing, cyanosis or edema; 2+ radial, ulnar and brachial pulses bilaterally; 2+ right femoral, posterior tibial and dorsalis pedis pulses; 2+ left femoral, posterior tibial and dorsalis pedis pulses; no subclavian or femoral bruits Neurological: grossly nonfocal Psych: Normal mood and affect     Wt Readings from Last 3 Encounters:  06/20/23 191 lb 3.2 oz (86.7 kg)  06/15/23 191 lb 3.2 oz (86.7 kg)  05/10/23 189 lb 12.8 oz (86.1 kg)   Studies/Labs Reviewed:   ECHO 07/02/2022  1. Left ventricular ejection fraction, by estimation, is 45 to 50%. The  left ventricle has mildly decreased function. The left ventricle  demonstrates global hypokinesis. There is mild concentric left ventricular  hypertrophy. Left ventricular diastolic  parameters are consistent with Grade I diastolic dysfunction (impaired  relaxation). Elevated left ventricular end-diastolic pressure.   2. Right ventricular systolic function is normal. The right ventricular  size  is normal. There is severely elevated pulmonary artery systolic  pressure.   3. Left atrial size was severely dilated.   4. Right atrial size was  mildly dilated.   5. The mitral valve is normal in structure. Mild to moderate mitral valve  regurgitation. No evidence of mitral stenosis.   6. The aortic valve is tricuspid. Aortic valve regurgitation is moderate  to severe. No aortic stenosis is present.   7. Pulmonic valve regurgitation is moderate.   8. Aortic dilatation noted. There is moderate dilatation of the aortic  root, measuring 43 mm. There is mild dilatation of the ascending aorta,  measuring 41 mm.   9. The inferior vena cava is normal in size with greater than 50%  respiratory variability, suggesting right atrial pressure of 3 mmHg.   TEE 03/07/2020: 1. Left ventricular ejection fraction, by estimation, is 35 to 40%. The  left ventricle has moderately decreased function. The left ventricle  demonstrates global hypokinesis.   2. Iatrogenic PFO visualized from prior ablation procedure.   3. Right ventricular systolic function is mildly reduced. The right  ventricular size is mildly enlarged.   4. Left atrial size was severely dilated. No left atrial/left atrial  appendage thrombus was detected.   5. The mitral valve is normal in structure. Mild mitral valve  regurgitation.   6. The aortic valve is tricuspid. There is mild calcification of the  aortic valve. There is mild thickening of the aortic valve. Aortic valve  regurgitation is mild. Mild aortic valve sclerosis is present, with no  evidence of aortic valve stenosis.   7. Aortic dilatation noted. There is mild dilatation of the ascending  aorta, measuring 37 mm. There is mild dilatation of the aortic root,  measuring 41 mm. There is Moderate (Grade III) plaque.   EKG:    EKG Interpretation Date/Time:  Monday June 20 2023 11:30:32 EST Ventricular Rate:  70 PR Interval:  256 QRS Duration:  102 QT Interval:  372 QTC Calculation: 401 R Axis:   65  Text Interpretation: Sinus rhythm with 1st degree A-V block Low voltage QRS ST & T wave abnormality, consider  lateral ischemia When compared with ECG of 11-Jan-2023 08:33, Premature ventricular complexes are no longer Present Nonspecific T wave abnormality, worse in Inferior leads Confirmed by Suraya Vidrine ([id number]) on 06/20/2023 11:47:08 AM        Lipid Panel    Component Value Date/Time   CHOL 135 01/31/2023 1048   TRIG 92.0 01/31/2023 1048   HDL 36.00 (L) 01/31/2023 1048   CHOLHDL 4 01/31/2023 1048   VLDL 18.4 01/31/2023 1048   LDLCALC 81 01/31/2023 1048   LDLDIRECT 84.0 05/02/2018 1523     ASSESSMENT:    1. Chronic combined systolic and diastolic heart failure (HCC)   2. PAF (paroxysmal atrial fibrillation) (HCC)   3. Acquired thrombophilia (HCC)   4. PAH (pulmonary artery hypertension) (HCC)   5. OSA (obstructive sleep apnea)   6. Essential hypertension   7. Stage 3b chronic kidney disease (HCC)   8. Type 2 diabetes mellitus with stage 3b chronic kidney disease, with long-term current use of insulin  (HCC)   9. Dyslipidemia (high LDL; low HDL)   10. Nonrheumatic aortic valve insufficiency   11. Ascending aorta dilatation (HCC)   12. Encounter for loop recorder at end of battery life       PLAN:  In order of problems listed above:  CHF: None functional status (NYHA functional class I clinically euvolemic without need  for change in diuretic dosing.  He has nonischemic cardiomyopathy.  He is taking SGLT2 inhibitors, hydralazine /nitrates, beta-blocker and a moderate dose of loop diuretic and also on a very low-dose of losartan .  Suspect that he has some degree of cardiomyopathy due to burned-out hypertension that explains his mildly depressed LVEF.  In the past he has had periods of severely depressed LV function due to tachycardia cardiomyopathy with ejection fraction as low as 10%. AFlutter/Afib: Excellent results following his ablation in 2020.  We are no longer able to use his loop recorder for monitoring, but in the last several years he is only had rare episodes of paroxysmal  atrial tachycardia and very brief bursts of atrial fibrillation.   On Eliquis  for CHA2DS2-VASc score of 4 (age, HTN, CHF, history of diabetes mellitus). Anticoagulation: Denies bleeding complications or falls. PAH: will re-evaluate this with another echocardiogram.  Part of this may be due to longstanding precapillary pulmonary hypertension from untreated sleep apnea.  Cannot exclude the fact that he may have elevated left atrial pressure based on the echo findings, but it is hard to interpret these in the setting of a markedly dilated left atrium that is probably mechanically ineffective.  Normal right ventricular systolic function and normal right atrial pressures. OSA: He reports 100% compliance with CPAP.  He had severe obstructive sleep apnea that went untreated for many years, probably decades.  He has lost substantial weight and is now almost at ideal body weight.  He denies daytime hypersomnolence. HTN: He has required lower and lower doses of heart failure medicines and antihypertensive as he is lost a lot of weight.  Would probably get rid of the hydralazine  first if he develops symptomatic hypotension. CKD3B: Kidney function continues to show an impressive positive trend.  His most recent GFR is around 45.  His nephrologist is Dr. Douglas.  On SGLT2 inhibitors a tiny dose of losartan .  No recent gout flares. DM: Exceptionally good control on Farxiga  and semaglutide .   HLP: On pravastatin .  H EL had improved, but has deteriorated slightly again.  LDL less than 100 is acceptable since we have never found any evidence of CAD/PAD. AI: Secondary to aortic annular ectasia and very stable in severity over many years.  Continue to follow yearly, will schedule repeat echocardiogram. ILR: At end of service and he would like it extracted.  Will schedule that in the near future.   Medication Adjustments/Labs and Tests Ordered: Current medicines are reviewed at length with the patient today.  Concerns  regarding medicines are outlined above.  Medication changes, Labs and Tests ordered today are listed in the Patient Instructions below. Patient Instructions  Medication Instructions:  No changes *If you need a refill on your cardiac medications before your next appointment, please call your pharmacy*  Follow-Up: At [facility], you and your health needs are our priority.  As part of our continuing mission to provide you with exceptional heart care, we have created designated Provider Care Teams.  These Care Teams include your primary Cardiologist (physician) and Advanced Practice Providers (APPs -  Physician Assistants and Nurse Practitioners) who all work together to provide you with the care you need, when you need it.  We recommend signing up for the patient portal called MyChart.  Sign up information is provided on this After Visit Summary.  MyChart is used to connect with patients for Virtual Visits (Telemedicine).  Patients are able to view lab/test results, encounter notes, upcoming appointments, etc.  Non-urgent messages can  be sent to your provider as well.   To learn more about what you can do with MyChart, go to [email or web address].    Your next appointment:   1 year(s)  Provider:   Jerel Balding, MD             Signed, Jerel Balding, MD  06/20/2023 3:07 PM    [facility] [address] [place], [place], [place]  [address] Phone: [phone]; Fax: [phone]

## 2023-06-22 ENCOUNTER — Telehealth: Payer: Self-pay | Admitting: Emergency Medicine

## 2023-06-22 NOTE — Telephone Encounter (Signed)
 Croitoru, Karyl Paget, MD  Kevin Simpson, RN He also wanted to schedule loop recorder extraction, please.       Comments  1/14- Needs Echo Feb- Needs Loop extraction   Echo already scheduled for 07/18/23 at 11:30 Informed patient that we can schedule the Loop Extraction for 07/14/23 at 12:00. He confirmed that this would work for him. Plan for Loop extraction at that time. Instructed the patient to wash the area very well the morning of the procedure. Informed him that I will contact him if there are any further instructions. He verbalized understanding.

## 2023-06-22 NOTE — Telephone Encounter (Signed)
Not necessary.

## 2023-06-23 DIAGNOSIS — M25561 Pain in right knee: Secondary | ICD-10-CM | POA: Diagnosis not present

## 2023-06-24 ENCOUNTER — Telehealth: Payer: Self-pay

## 2023-06-24 ENCOUNTER — Ambulatory Visit: Payer: Medicare HMO | Admitting: Cardiovascular Disease

## 2023-06-24 NOTE — Telephone Encounter (Signed)
Patient Assistance  Medication: Ozempic  Quantity:4 boxes  Date received:06/23/23  Dicie Beam

## 2023-06-26 NOTE — Assessment & Plan Note (Signed)
Well controlled, no changes to meds. Encouraged heart healthy diet such as the DASH diet and exercise as tolerated.  °

## 2023-06-26 NOTE — Assessment & Plan Note (Signed)
No recent exacerbation, follows with cards

## 2023-06-26 NOTE — Assessment & Plan Note (Signed)
Encouraged good sleep hygiene such as dark, quiet room. No blue/green glowing lights such as computer screens in bedroom. No alcohol or stimulants in evening. Cut down on caffeine as able. Regular exercise is helpful but not just prior to bed time. Stable on Temazepam

## 2023-06-26 NOTE — Assessment & Plan Note (Signed)
Rate controlled and tolerating current meds, follows with cardiology

## 2023-06-26 NOTE — Assessment & Plan Note (Signed)
Encourage heart healthy diet such as MIND or DASH diet, increase exercise, avoid trans fats, simple carbohydrates and processed foods, consider a krill or fish or flaxseed oil cap daily. Pravastatin tolerating

## 2023-06-26 NOTE — Assessment & Plan Note (Signed)
Doing well 

## 2023-06-26 NOTE — Assessment & Plan Note (Signed)
No concerning symptoms. Tolerating current meds

## 2023-06-26 NOTE — Assessment & Plan Note (Signed)
Hydrate and monitor 

## 2023-06-26 NOTE — Assessment & Plan Note (Signed)
hgba1c acceptable, minimize simple carbs. Increase exercise as tolerated.  

## 2023-06-27 ENCOUNTER — Encounter: Payer: Self-pay | Admitting: Family Medicine

## 2023-06-27 ENCOUNTER — Ambulatory Visit (INDEPENDENT_AMBULATORY_CARE_PROVIDER_SITE_OTHER): Payer: Medicare HMO | Admitting: Family Medicine

## 2023-06-27 VITALS — BP 112/70 | HR 67 | Temp 97.9°F | Resp 16 | Ht 71.0 in | Wt 191.4 lb

## 2023-06-27 DIAGNOSIS — I4891 Unspecified atrial fibrillation: Secondary | ICD-10-CM

## 2023-06-27 DIAGNOSIS — G4700 Insomnia, unspecified: Secondary | ICD-10-CM

## 2023-06-27 DIAGNOSIS — N189 Chronic kidney disease, unspecified: Secondary | ICD-10-CM

## 2023-06-27 DIAGNOSIS — I5042 Chronic combined systolic (congestive) and diastolic (congestive) heart failure: Secondary | ICD-10-CM

## 2023-06-27 DIAGNOSIS — B181 Chronic viral hepatitis B without delta-agent: Secondary | ICD-10-CM | POA: Diagnosis not present

## 2023-06-27 DIAGNOSIS — I10 Essential (primary) hypertension: Secondary | ICD-10-CM | POA: Diagnosis not present

## 2023-06-27 DIAGNOSIS — D6869 Other thrombophilia: Secondary | ICD-10-CM

## 2023-06-27 DIAGNOSIS — Z23 Encounter for immunization: Secondary | ICD-10-CM | POA: Diagnosis not present

## 2023-06-27 DIAGNOSIS — M1A9XX0 Chronic gout, unspecified, without tophus (tophi): Secondary | ICD-10-CM | POA: Diagnosis not present

## 2023-06-27 DIAGNOSIS — N2581 Secondary hyperparathyroidism of renal origin: Secondary | ICD-10-CM

## 2023-06-27 DIAGNOSIS — E1122 Type 2 diabetes mellitus with diabetic chronic kidney disease: Secondary | ICD-10-CM | POA: Diagnosis not present

## 2023-06-27 DIAGNOSIS — E782 Mixed hyperlipidemia: Secondary | ICD-10-CM | POA: Diagnosis not present

## 2023-06-27 LAB — MICROALBUMIN / CREATININE URINE RATIO
Creatinine,U: 128.2 mg/dL
Microalb Creat Ratio: 0.9 mg/g (ref 0.0–30.0)
Microalb, Ur: 1.1 mg/dL (ref 0.0–1.9)

## 2023-06-27 LAB — CBC WITH DIFFERENTIAL/PLATELET
Basophils Absolute: 0 10*3/uL (ref 0.0–0.1)
Basophils Relative: 0.5 % (ref 0.0–3.0)
Eosinophils Absolute: 0 10*3/uL (ref 0.0–0.7)
Eosinophils Relative: 0.8 % (ref 0.0–5.0)
HCT: 45.2 % (ref 39.0–52.0)
Hemoglobin: 14.5 g/dL (ref 13.0–17.0)
Lymphocytes Relative: 30.9 % (ref 12.0–46.0)
Lymphs Abs: 1.7 10*3/uL (ref 0.7–4.0)
MCHC: 32 g/dL (ref 30.0–36.0)
MCV: 81.6 fL (ref 78.0–100.0)
Monocytes Absolute: 0.8 10*3/uL (ref 0.1–1.0)
Monocytes Relative: 13.9 % — ABNORMAL HIGH (ref 3.0–12.0)
Neutro Abs: 2.9 10*3/uL (ref 1.4–7.7)
Neutrophils Relative %: 53.9 % (ref 43.0–77.0)
Platelets: 194 10*3/uL (ref 150.0–400.0)
RBC: 5.55 Mil/uL (ref 4.22–5.81)
RDW: 16.7 % — ABNORMAL HIGH (ref 11.5–15.5)
WBC: 5.4 10*3/uL (ref 4.0–10.5)

## 2023-06-27 LAB — COMPREHENSIVE METABOLIC PANEL
ALT: 14 U/L (ref 0–53)
AST: 17 U/L (ref 0–37)
Albumin: 4.7 g/dL (ref 3.5–5.2)
Alkaline Phosphatase: 47 U/L (ref 39–117)
BUN: 32 mg/dL — ABNORMAL HIGH (ref 6–23)
CO2: 31 meq/L (ref 19–32)
Calcium: 10 mg/dL (ref 8.4–10.5)
Chloride: 102 meq/L (ref 96–112)
Creatinine, Ser: 1.61 mg/dL — ABNORMAL HIGH (ref 0.40–1.50)
GFR: 43.07 mL/min — ABNORMAL LOW (ref >60.00)
Glucose, Bld: 126 mg/dL — ABNORMAL HIGH (ref 70–99)
Potassium: 4.1 meq/L (ref 3.5–5.1)
Sodium: 143 meq/L (ref 135–145)
Total Bilirubin: 0.6 mg/dL (ref 0.2–1.2)
Total Protein: 7.9 g/dL (ref 6.0–8.3)

## 2023-06-27 LAB — LIPID PANEL
Cholesterol: 155 mg/dL (ref 0–200)
HDL: 43.8 mg/dL (ref >39.00)
LDL Cholesterol: 93 mg/dL (ref 0–99)
NonHDL: 111.02
Total CHOL/HDL Ratio: 4
Triglycerides: 89 mg/dL (ref 0.0–149.0)
VLDL: 17.8 mg/dL (ref 0.0–40.0)

## 2023-06-27 LAB — URIC ACID: Uric Acid, Serum: 5.9 mg/dL (ref 4.0–7.8)

## 2023-06-27 LAB — TSH: TSH: 2.02 u[IU]/mL (ref 0.35–5.50)

## 2023-06-27 NOTE — Patient Instructions (Signed)
Shingrix is the new shingles shot, 2 shots over 2-6 months, confirm coverage with insurance and document, then can return here for shots with nurse appt or at pharmacy   Tetanus booster at pharmacy  RSV, Respiratory Syncitial Virus Vaccine, Arexvy Vaccine at pharmacy  Annual Covid and flu shot

## 2023-06-27 NOTE — Telephone Encounter (Signed)
 Patient picked up patient assistance - Log Noted

## 2023-06-28 ENCOUNTER — Encounter: Payer: Self-pay | Admitting: Family Medicine

## 2023-06-28 LAB — PARATHYROID HORMONE, INTACT (NO CA): PTH: 53 pg/mL (ref 16–77)

## 2023-06-28 NOTE — Progress Notes (Signed)
Subjective:    Patient ID: [name], male    DOB: [birth year], 73 y.o.   MRN: [id number]  Chief Complaint  Patient presents with  . Follow-up    4 month    HPI Discussed the use of AI scribe software for clinical note transcription with the patient, who gave verbal consent to proceed.  History of Present Illness   The patient, with a history of cardiac issues, presents in good health with no recent sickness or ER visits. They have been managing their blood sugar levels through diet, resulting in a decrease in their A1c levels from 6.5 to 5.7. They have also been managing muscle spasms with tizanidine as needed and recently received a cortisone shot for knee pain. The patient reports that they are staying active and maintaining a healthy diet. They have been part of a cardiac treatment program for four years, which has significantly improved their cardiac function.        Past Medical History:  Diagnosis Date  . AORTIC STENOSIS, MODERATE 12/05/2009  . Arthritis   . Atrial fibrillation (HCC)   . Atrial fibrillation with RVR (HCC) 07/05/2017  . Atrial flutter (HCC) 12/05/2009  . Atypical chest pain 11/08/2011  . BENIGN PROSTATIC HYPERTROPHY, HX OF 12/05/2009  . CHEST PAIN-UNSPECIFIED 11/11/2009  . CHF 12/05/2009  . Chronic renal insufficiency 12/16/2016  . Debility 04/18/2012  . Diarrhea 12/16/2016  . DYSPNEA 12/19/2009  . Edema 04/18/2012  . ERECTILE DYSFUNCTION 01/25/2007  . FATIGUE, CHRONIC 11/11/2009  . Gout 07/10/2012  . Heart murmur   . History of kidney stones    "they passed" (07/05/2017)  . Hyperkalemia 04/18/2012  . Hyperlipidemia   . Hypertension   . HYPERTENSION 01/25/2007  . Hypoxia 05/05/2011  . Insomnia 05/13/2016  . INSOMNIA, HX OF 01/25/2007  . Knee pain, bilateral 12/28/2010  . Knee pain, right 12/28/2010  . NEUROMA 01/05/2010  . OBESITY NOS 01/25/2007  . OSA on CPAP 04/06/2010  . PERIPHERAL NEUROPATHY 01/05/2010  . PULMONARY FUNCTION TESTS,  ABNORMAL 02/02/2010  . Sleep apnea    wears CPAP  . Superficial thrombophlebitis of left leg 09/07/2011  . TESTICULAR HYPOFUNCTION 01/05/2010  . 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 Toe pain, right 07/10/2012  . Type II diabetes mellitus (HCC) 12/05/2009  . UNSPECIFIED ANEMIA 01/05/2010    Past Surgical History:  Procedure Laterality Date  . A-FLUTTER ABLATION N/A 05/16/2019   Procedure: A-FLUTTER ABLATION;  Surgeon: Regan Lemming, MD;  Location: [facility] INVASIVE CV LAB;  Service: Cardiovascular;  Laterality: N/A;  . ATRIAL FIBRILLATION ABLATION N/A 05/16/2019   Procedure: ATRIAL FIBRILLATION ABLATION;  Surgeon: Regan Lemming, MD;  Location: [facility]VAS[facility];  Service: Cardiovascular;  Laterality: N/A;  . ATRIAL FIBRILLATION ABLATION N/A 03/10/2020   Procedure: ATRIAL FIBRILLATION ABLATION;  Surgeon: Regan Lemming, MD;  Location: [facility];  Service: Cardiovascular;  Laterality: N/A;  . CARDIAC CATHETERIZATION  10/16/2009   nonischemic cardiomyopathy  . CARDIOVERSION N/A 03/25/2015   Procedure: CARDIOVERSION;  Surgeon: Chrystie Nose, MD;  Location[facility] ENDOSCOPY;  Service: Cardiovascular;  Laterality: N/A;  . CARDIOVERSION N/A 06/17/2017   Procedure: CARDIOVERSION;  Surgeon: Thurmon Fair, MD;  Location: [facility] ENDOSCOPY;  Service: Cardiovascular;  Laterality: N/A;  . CARDIOVERSION N/A 07/08/2017   Procedure: CARDIOVERSION;  Surgeon: Pricilla Riffle, MD;  Location: [facility] ENDOSCOPY;  Service: Cardiovascular;  Laterality: N/A;  . CARDIOVERSION N/A 02/27/2019   Procedure: CARDIOVERSION;  Surgeon: Thurmon Fair, MD;  Location: [facility] ENDOSCOPY;  Service: Cardiovascular;  Laterality: N/A;  . CARDIOVERSION N/A 08/16/2019   Procedure: CARDIOVERSION;  Surgeon: Thurmon Fair, MD;  Location: [facility] ENDOSCOPY;  Service: Cardiovascular;  Laterality: N/A;  . COLONOSCOPY  03/10/2021   1st colonoscopy , Amada Jupiter, [facility], large transverse polyp, clip and spot applied  . METATARSAL  OSTEOTOMY Bilateral ~ 1980   removed part of 5th metatarsal to corect curvature of toes   . RIGHT HEART CATH N/A 05/16/2019   Procedure: RIGHT HEART CATH;  Surgeon: Dolores Patty, MD;  Location: [facility] INVASIVE CV LAB;  Service: Cardiovascular;  Laterality: N/A;  . TEE WITHOUT CARDIOVERSION  05/16/2019   Procedure: Transesophageal Echocardiogram (Tee);  Surgeon: Regan Lemming, MD;  Location: [facility] INVASIVE CV LAB;  Service: Cardiovascular;;  . TEE WITHOUT CARDIOVERSION N/A 03/07/2020   Procedure: TRANSESOPHAGEAL ECHOCARDIOGRAM (TEE);  Surgeon: Meriam Sprague, MD;  Location: [facility] ENDOSCOPY;  Service: Cardiovascular;  Laterality: N/A;    Family History  Problem Relation Age of Onset  . Clotting disorder Mother   . Heart disease Mother        s/p MI  . Heart attack Mother   . Hypertension Mother   . Diabetes Mother   . Hyperlipidemia Mother   . Stroke Mother   . Cancer Father        ? lung  . Lung disease Father        smoker  . Diabetes Sister   . Hypertension Sister        smoker  . Aneurysm Sister        brain  . Other Sister        clipped  . Seizures Sister        d/o w/aneurysm/ smoker  . Leukemia Maternal Grandmother        ?  . Colon cancer Neg Hx   . Colon polyps Neg Hx   . Stomach cancer Neg Hx   . Rectal cancer Neg Hx   . Esophageal cancer Neg Hx     Social History   Socioeconomic History  . Marital status: Married    Spouse name: Not on file  . Number of children: 4  . Years of education: 51  . Highest education level: High school graduate  Occupational History  . Occupation: retired    Associate Professor: [employer]  Tobacco Use  . Smoking status: Former    Current packs/day: 0.00    Average packs/day: 1 pack/day for 10.0 years (10.0 ttl pk-yrs)    Types: Cigarettes    Start date: 06/07/1970    Quit date: 06/07/1980    Years since quitting: 43.0  . Smokeless tobacco: Never  Vaping Use  . Vaping status: Never Used  Substance and Sexual Activity  .  Alcohol use: No  . Drug use: No  . Sexual activity: Not Currently  Other Topics Concern  . Not on file  Social History Narrative   Lives with male partner in a one story home.  His son lives there off and on.  Retired from [employer].  Education: high school.  Right handed   Social Drivers of Health   Financial Resource Strain: Not on file  Food Insecurity: Not on file  Transportation Needs: Not on file  Physical Activity: Not on file  Stress: Stress Concern Present (04/05/2022)   [employer] of Occupational Health - Occupational Stress Questionnaire   . Feeling of Stress : To some extent  Social Connections: Not on file  Intimate Partner Violence: Not  on file    Outpatient Medications Prior to Visit  Medication Sig Dispense Refill  . Accu-Chek Softclix Lancets lancets Use as instructed 200 each 3  . albuterol (VENTOLIN HFA) 108 (90 Base) MCG/ACT inhaler Inhale 2 puffs into the lungs every 6 (six) hours as needed for wheezing or shortness of breath. 1 Inhaler 6  . Alcohol Swabs (DROPSAFE ALCOHOL PREP) 70 % PADS USE AS DIRECTED 300 each 3  . allopurinol (ZYLOPRIM) 100 MG tablet Take 1 tablet by mouth once daily 90 tablet 0  . apixaban (ELIQUIS) 5 MG TABS tablet Take 1 tablet by mouth twice daily 60 tablet 5  . apixaban (ELIQUIS) 5 MG TABS tablet Take 1 tablet (5 mg total) by mouth 2 (two) times daily. 42 tablet   . Blood Glucose Monitoring Suppl (ACCU-CHEK GUIDE ME) w/Device KIT USE TO TEST BLOOD SUGAR ONCE TO TWICE DAILY 1 kit 0  . calcium carbonate (TUMS - DOSED IN MG ELEMENTAL CALCIUM) 500 MG chewable tablet Chew 500 mg by mouth daily as needed for indigestion or heartburn.    . cetirizine (ZYRTEC) 10 MG tablet Take 1 tablet (10 mg total) by mouth daily. 30 tablet 11  . clobetasol ointment (TEMOVATE) 0.05 % 1 application to affected area    . Clobetasol Prop Emollient Base (CLOBETASOL PROPIONATE E) 0.05 % emollient cream Apply 1 application topically 2 (two) times daily as  needed (insect bite). 60 g 1  . colchicine (COLCRYS) 0.6 MG tablet Take one tablet for 4-6 days as needed for gout flare up. 15 tablet 3  . dapagliflozin propanediol (FARXIGA) 10 MG TABS tablet Take 1 tablet (10 mg total) by mouth daily. 90 tablet 3  . dapagliflozin propanediol (FARXIGA) 10 MG TABS tablet Take 1 tablet (10 mg total) by mouth daily before breakfast. 21 tablet   . fish oil-omega-3 fatty acids 1000 MG capsule Take 1 g by mouth daily.    . furosemide (LASIX) 80 MG tablet Take 1 tablet (80 mg total) by mouth daily. As needed patient may take an additional 40 MG tablet as direct per [employer] study PRN plan. 90 tablet 1  . gabapentin (NEURONTIN) 100 MG capsule Take 2 capsules (200 mg total) by mouth at bedtime. 180 capsule 3  . glucose blood (ACCU-CHEK GUIDE) test strip USE TO TEST BLOOD SUGAR ONCE TO TWICE DAILY 200 each 1  . hydrALAZINE (APRESOLINE) 10 MG tablet Take 1 tablet (10 mg total) by mouth 3 (three) times daily. 200 tablet 3  . HYDROcodone-acetaminophen (NORCO) 10-325 MG tablet Take 1 tablet by mouth every 8 (eight) hours as needed for moderate pain or severe pain. 90 tablet 0  . hydrocortisone 2.5 % ointment Apply topically 2 (two) times daily as needed. 30 g 1  . isosorbide mononitrate (IMDUR) 30 MG 24 hr tablet Take 1 tablet (30 mg total) by mouth daily. 90 tablet 3  . LORazepam (ATIVAN) 1 MG tablet TAKE 1 TABLET BY MOUTH EVERY 8 HOURS AS NEEDED FOR ANXIETY 30 tablet 0  . magnesium oxide (MAG-OX) 400 MG tablet Take 1 tablet (400 mg total) by mouth 2 (two) times daily. 30 tablet 11  . metoprolol succinate (TOPROL-XL) 50 MG 24 hr tablet TAKE 1 TABLET BY MOUTH IN THE MORNING AND 1 AT BEDTIME WITH  OR  FOLLOWING  A  MEAL 180 tablet 1  . Multiple Vitamin (MULTIVITAMIN WITH MINERALS) TABS tablet Take 1 tablet by mouth daily.     . potassium chloride SA (KLOR-CON M20) 20 MEQ  tablet Take 1 tablet (20 mEq total) by mouth as directed. 1 tablet daily 20 mEq by mouth as directed  per alleviate research protocol. 90 tablet 3  . pravastatin (PRAVACHOL) 40 MG tablet Take 1 tablet by mouth once daily 90 tablet 3  . Semaglutide,0.25 or 0.5MG /DOS, 2 MG/3ML SOPN Inject 0.5 mg into the skin once a week.    . tamsulosin (FLOMAX) 0.4 MG CAPS capsule Take 1 capsule by mouth once daily 30 capsule 0  . temazepam (RESTORIL) 30 MG capsule Take 1 capsule by mouth at bedtime 30 capsule 0  . tiZANidine (ZANAFLEX) 2 MG tablet Take 0.5-1 tablets (1-2 mg total) by mouth at bedtime as needed for muscle spasms. 30 tablet 1  . VITAMIN D PO Take by mouth.    . cefdinir (OMNICEF) 300 MG capsule Take 1 capsule (300 mg total) by mouth 2 (two) times daily. 10 capsule 0  . losartan (COZAAR) 25 MG tablet Take 0.5 tablets (12.5 mg total) by mouth daily. 45 tablet 3   No facility-administered medications prior to visit.    Allergies  Allergen Reactions  . Sulfonamide Derivatives Other (See Comments)    Flu like symptoms   . Zolpidem Other (See Comments)    Excessive, prolonged sedation    Review of Systems  Constitutional:  Negative for fever and malaise/fatigue.  HENT:  Negative for congestion.   Eyes:  Negative for blurred vision.  Respiratory:  Negative for shortness of breath.   Cardiovascular:  Negative for chest pain, palpitations and leg swelling.  Gastrointestinal:  Negative for abdominal pain, blood in stool and nausea.  Genitourinary:  Negative for dysuria and frequency.  Musculoskeletal:  Negative for falls.  Skin:  Negative for rash.  Neurological:  Negative for dizziness, loss of consciousness and headaches.  Endo/Heme/Allergies:  Negative for environmental allergies.  Psychiatric/Behavioral:  Negative for depression. The patient is not nervous/anxious.       Objective:    Physical Exam Vitals reviewed.  Constitutional:      Appearance: Normal appearance. He is not ill-appearing.  HENT:     Head: Normocephalic and atraumatic.     Nose: Nose normal.  Eyes:      Conjunctiva/sclera: Conjunctivae normal.  Cardiovascular:     Rate and Rhythm: Normal rate.     Pulses: Normal pulses.     Heart sounds: Murmur heard.  Pulmonary:     Effort: Pulmonary effort is normal.     Breath sounds: Normal breath sounds. No wheezing.  Abdominal:     Palpations: Abdomen is soft. There is no mass.     Tenderness: There is no abdominal tenderness.  Musculoskeletal:     Cervical back: Normal range of motion.     Right lower leg: No edema.     Left lower leg: No edema.  Skin:    General: Skin is warm and dry.  Neurological:     General: No focal deficit present.     Mental Status: He is alert and oriented to person, place, and time.  Psychiatric:        Mood and Affect: Mood normal.   BP 112/70 (BP Location: Left Arm, Patient Position: Sitting, Cuff Size: Normal)   Pulse 67   Temp 97.9 F (36.6 C) (Oral)   Resp 16   Ht 5\' 11"  (1.803 m)   Wt 191 lb 6.4 oz (86.8 kg)   SpO2 98%   BMI 26.69 kg/m  Wt Readings from Last 3 Encounters:  06/27/23 191 lb  6.4 oz (86.8 kg)  06/20/23 191 lb 3.2 oz (86.7 kg)  06/15/23 191 lb 3.2 oz (86.7 kg)    Diabetic Foot Exam - Simple   No data filed    Lab Results  Component Value Date   WBC 5.4 06/27/2023   HGB 14.5 06/27/2023   HCT 45.2 06/27/2023   PLT 194.0 06/27/2023   GLUCOSE 126 (H) 06/27/2023   CHOL 155 06/27/2023   TRIG 89.0 06/27/2023   HDL 43.80 06/27/2023   LDLDIRECT 84.0 05/02/2018   LDLCALC 93 06/27/2023   ALT 14 06/27/2023   AST 17 06/27/2023   NA 143 06/27/2023   K 4.1 06/27/2023   CL 102 06/27/2023   CREATININE 1.61 (H) 06/27/2023   BUN 32 (H) 06/27/2023   CO2 31 06/27/2023   TSH 2.02 06/27/2023   PSA 16.25 (H) 01/20/2023   INR 2.2 09/06/2018   HGBA1C 5.7 (A) 05/10/2023   MICROALBUR 1.1 06/27/2023    Lab Results  Component Value Date   TSH 2.02 06/27/2023   Lab Results  Component Value Date   WBC 5.4 06/27/2023   HGB 14.5 06/27/2023   HCT 45.2 06/27/2023   MCV 81.6 06/27/2023    PLT 194.0 06/27/2023   Lab Results  Component Value Date   NA 143 06/27/2023   K 4.1 06/27/2023   CO2 31 06/27/2023   GLUCOSE 126 (H) 06/27/2023   BUN 32 (H) 06/27/2023   CREATININE 1.61 (H) 06/27/2023   BILITOT 0.6 06/27/2023   ALKPHOS 47 06/27/2023   AST 17 06/27/2023   ALT 14 06/27/2023   PROT 7.9 06/27/2023   ALBUMIN 4.7 06/27/2023   CALCIUM 10.0 06/27/2023   ANIONGAP 11 01/21/2023   EGFR 47 (L) 06/09/2022   GFR 43.07 (L) 06/27/2023   Lab Results  Component Value Date   CHOL 155 06/27/2023   Lab Results  Component Value Date   HDL 43.80 06/27/2023   Lab Results  Component Value Date   LDLCALC 93 06/27/2023   Lab Results  Component Value Date   TRIG 89.0 06/27/2023   Lab Results  Component Value Date   CHOLHDL 4 06/27/2023   Lab Results  Component Value Date   HGBA1C 5.7 (A) 05/10/2023       Assessment & Plan:  Atrial fibrillation, unspecified type (HCC) Assessment & Plan: Rate controlled and tolerating current meds, follows with cardiology   Chronic renal impairment, unspecified CKD stage Assessment & Plan: Hydrate and monitor    Chronic combined systolic and diastolic heart failure (HCC) Assessment & Plan: No recent exacerbation, follows with cards   Chronic viral hepatitis B (HCC) Assessment & Plan: Doing well   Type 2 diabetes mellitus with diabetic chronic kidney disease, unspecified CKD stage, unspecified whether long term insulin use (HCC) Assessment & Plan: hgba1c acceptable, minimize simple carbs. Increase exercise as tolerated.   Orders: -     Microalbumin / creatinine urine ratio  Essential hypertension Assessment & Plan: Well controlled, no changes to meds. Encouraged heart healthy diet such as the DASH diet and exercise as tolerated.   Orders: -     Comprehensive metabolic panel -     CBC with Differential/Platelet -     TSH  Chronic gout without tophus, unspecified cause, unspecified site Assessment & Plan: Hydrate  and monitor   Orders: -     Uric acid  Mixed hyperlipidemia Assessment & Plan: Encourage heart healthy diet such as MIND or DASH diet, increase exercise, avoid trans fats, simple carbohydrates  and processed foods, consider a krill or fish or flaxseed oil cap daily. Pravastatin tolerating   Orders: -     Lipid panel  Insomnia, unspecified type Assessment & Plan: Encouraged good sleep hygiene such as dark, quiet room. No blue/green glowing lights such as computer screens in bedroom. No alcohol or stimulants in evening. Cut down on caffeine as able. Regular exercise is helpful but not just prior to bed time. Stable on Temazepam   Secondary hypercoagulable state [facility]) Assessment & Plan: No concerning symptoms. Tolerating current meds   Secondary hyperparathyroidism of renal origin [facility]) Assessment & Plan: Hydrate and monitor   Orders: -     Parathyroid hormone, intact (no Ca)  Flu vaccine need -     Flu Vaccine Trivalent High Dose (Fluad)    Assessment and Plan    Cardiac Function Stable cardiac function with no recent changes or concerns. Patient is active and adhering to dietary recommendations. -Continue current management and follow-up with cardiology as needed.  Diabetes Improved A1c from 6.5 to 5.7 due to dietary changes. Currently on Semaglutide 0.5mg . -Continue Semaglutide 0.5mg  and current dietary habits.  Muscle Spasms Occasional muscle spasms managed with Tizanidine as needed. Recent cortisone shot for right knee pain. -Continue Tizanidine as needed for muscle spasms.  Anticoagulation Eliquis prescribed by Dr. Val Eagle. Recent sample provided by another physician. -Continue Eliquis as prescribed by Dr. Val Eagle.  Vaccinations Last flu shot in 2022. Eligible for tetanus, shingles, RSV, and COVID booster. -Administer flu shot today. -Recommend patient to consider tetanus, shingles, RSV, and COVID booster at the pharmacy.  Follow-up Annual wellness  visit recommended. -Schedule follow-up visit in 3-4 months. -Schedule physical in 6-7 months. -Schedule annual wellness visit.         Danise Edge, MD

## 2023-07-05 ENCOUNTER — Ambulatory Visit: Payer: Medicare HMO | Admitting: Family Medicine

## 2023-07-05 DIAGNOSIS — R972 Elevated prostate specific antigen [PSA]: Secondary | ICD-10-CM | POA: Diagnosis not present

## 2023-07-06 DIAGNOSIS — E1122 Type 2 diabetes mellitus with diabetic chronic kidney disease: Secondary | ICD-10-CM | POA: Diagnosis not present

## 2023-07-06 DIAGNOSIS — E876 Hypokalemia: Secondary | ICD-10-CM | POA: Diagnosis not present

## 2023-07-06 DIAGNOSIS — N1832 Chronic kidney disease, stage 3b: Secondary | ICD-10-CM | POA: Diagnosis not present

## 2023-07-06 DIAGNOSIS — I129 Hypertensive chronic kidney disease with stage 1 through stage 4 chronic kidney disease, or unspecified chronic kidney disease: Secondary | ICD-10-CM | POA: Diagnosis not present

## 2023-07-06 DIAGNOSIS — N2581 Secondary hyperparathyroidism of renal origin: Secondary | ICD-10-CM | POA: Diagnosis not present

## 2023-07-10 ENCOUNTER — Other Ambulatory Visit: Payer: Self-pay | Admitting: Family Medicine

## 2023-07-10 ENCOUNTER — Other Ambulatory Visit: Payer: Self-pay | Admitting: Cardiology

## 2023-07-11 NOTE — Telephone Encounter (Signed)
Requesting: temazepam 30mg   Contract: 09/21/22 UDS: 09/21/22 Last Visit: 06/27/23 Next Visit: 11/07/23 Last Refill: 06/06/23 #30 and 0RF   Please Advise

## 2023-07-12 ENCOUNTER — Other Ambulatory Visit: Payer: Self-pay | Admitting: Family

## 2023-07-14 ENCOUNTER — Ambulatory Visit: Payer: Medicare HMO | Attending: Cardiovascular Disease | Admitting: Cardiovascular Disease

## 2023-07-14 DIAGNOSIS — Z4509 Encounter for adjustment and management of other cardiac device: Secondary | ICD-10-CM | POA: Diagnosis not present

## 2023-07-14 MED ORDER — LIDOCAINE-EPINEPHRINE 1 %-1:100000 IJ SOLN
20.0000 mL | Freq: Once | INTRAMUSCULAR | Status: AC
  Administered 2023-07-14: 20 mL via INTRADERMAL

## 2023-07-14 NOTE — Patient Instructions (Signed)
 Medication Instructions:  No changes  *If you need a refill on your cardiac medications before your next appointment, please call your pharmacy*   Lab Work: Not needed If you have labs (blood work) drawn today and your tests are completely normal, you will receive your results only by: MyChart Message (if you have MyChart) OR A paper copy in the mail If you have any lab test that is abnormal or we need to change your treatment, we will call you to review the results.   Testing/Procedures:  Not  needed  Follow-Up: At [facility], you and your health needs are our priority.  As part of our continuing mission to provide you with exceptional heart care, we have created designated Provider Care Teams.  These Care Teams include your primary Cardiologist (physician) and Advanced Practice Providers (APPs -  Physician Assistants and Nurse Practitioners) who all work together to provide you with the care you need, when you need it.     Your next appointment:   11 month(s)  The format for your next appointment:   In Person  Provider:   Jerel Balding, MD    Other Instructions   Continue  watching your incision site -   contact office - fever, swelling   drainage excessive

## 2023-07-14 NOTE — Progress Notes (Signed)
 LOOP RECORDER EXPLANTATION   Procedure report  Procedure performed:  Loop recorder explantation   Reason for procedure:  Device at end of service Procedure performed by:  Jerel Balding, MD  Complications:  None  Estimated blood loss:  <5 mL  Medications administered during procedure:  Lidocaine  1% with 1/100,000 epinephrine  10 mL locally Device details:  Medtronic Reveal Linq model number [id number] Procedure details:  After the risks and benefits of the procedure were discussed the patient provided informed consent. The patient was prepped and draped in usual sterile fashion. Local anesthesia was administered to an area 2 cm to the left of the sternum in the 4th intercostal space. A 1 cm cutaneous incision was made using the the scalpel. The device was easily extracted with a hemostat.  The incision was closed with SteriStrips and a sterile dressing was applied.   Jerel Balding, MD, [facility] [phone] office [phone] pager 07/14/2023 1:27 PM

## 2023-07-15 ENCOUNTER — Other Ambulatory Visit: Payer: Self-pay | Admitting: Family Medicine

## 2023-07-15 NOTE — Telephone Encounter (Signed)
 Copied from CRM [id number]. Topic: Clinical - Medication Refill >> Jul 15, 2023  4:19 PM Kinnie DEL wrote: Most Recent Primary Care Visit:  Provider: DOMENICA BLACKBIRD A  Department: [facility]  Visit Type: OFFICE VISIT  Date: 06/27/2023  Medication: LORazepam  (ATIVAN ) 1 MG tablet   Has the patient contacted their pharmacy? Yes (Agent: If no, request that the patient contact the pharmacy for the refill. If patient does not wish to contact the pharmacy document the reason why and proceed with request.) (Agent: If yes, when and what did the pharmacy advise?)  Is this the correct pharmacy for this prescription? Yes If no, delete pharmacy and type the correct one.  This is the patient's preferred pharmacy:  [facility] [address], [place] - [address] [place] [address] Phone: [phone] Fax: [phone]  [facility] - [place], [place] - [address] [place] [address] Phone: [phone] Fax: [phone]  [facility] - [place], [place] - [address] [place] [address] [place] [address] Phone: [phone] Fax: [phone]   Has the prescription been filled recently? No  Is the patient out of the medication? Yes  Has the patient been seen for an appointment in the last year OR does the patient have an upcoming appointment? Yes  Can we respond through MyChart? No  Agent: Please be advised that Rx refills may take up to 3 business days. We ask that you follow-up with your pharmacy.

## 2023-07-17 ENCOUNTER — Other Ambulatory Visit: Payer: Self-pay | Admitting: Cardiovascular Disease

## 2023-07-17 DIAGNOSIS — Z006 Encounter for examination for normal comparison and control in clinical research program: Secondary | ICD-10-CM

## 2023-07-18 ENCOUNTER — Ambulatory Visit (HOSPITAL_COMMUNITY): Payer: Medicare HMO | Attending: Cardiovascular Disease

## 2023-07-18 ENCOUNTER — Other Ambulatory Visit: Payer: Self-pay | Admitting: Family Medicine

## 2023-07-18 DIAGNOSIS — I351 Nonrheumatic aortic (valve) insufficiency: Secondary | ICD-10-CM | POA: Diagnosis not present

## 2023-07-18 LAB — ECHOCARDIOGRAM COMPLETE
Area-P 1/2: 4.06 cm2
P 1/2 time: 714 ms
S' Lateral: 3.9 cm

## 2023-07-18 MED ORDER — LORAZEPAM 1 MG PO TABS: 1.0000 mg | ORAL_TABLET | Freq: Three times a day (TID) | ORAL | 0 refills | Status: DC | PRN

## 2023-07-18 NOTE — Telephone Encounter (Signed)
 Requesting: lorazepam  1mg  Contract: Yes UDS: 09/21/22 Last Visit: 06/27/2023 Next Visit: 11/07/2023 Last Refill: 05/18/23  Please Advise

## 2023-07-18 NOTE — Telephone Encounter (Signed)
 Copied from CRM [id number]. Topic: Clinical - Medication Refill >> Jul 18, 2023  1:29 PM Dimple Francis wrote: Most Recent Primary Care Visit:  Provider: Randie Bustle A  Department: [facility]  Visit Type: OFFICE VISIT  Date: 06/27/2023  Medication: LORazepam  (ATIVAN ) 1 MG tablet  Has the patient contacted their pharmacy? Yes (Agent: If no, request that the patient contact the pharmacy for the refill. If patient does not wish to contact the pharmacy document the reason why and proceed with request.) (Agent: If yes, when and what did the pharmacy advise?)  Is this the correct pharmacy for this prescription? Yes If no, delete pharmacy and type the correct one.  This is the patient's preferred pharmacy:  [facility] [address], [place] - [address] [place] [address] Phone: [phone] Fax: [phone]    Has the prescription been filled recently? Yes  Is the patient out of the medication? Yes  Has the patient been seen for an appointment in the last year OR does the patient have an upcoming appointment? Yes  Can we respond through MyChart? Yes  Agent: Please be advised that Rx refills may take up to 3 business days. We ask that you follow-up with your pharmacy.

## 2023-07-19 ENCOUNTER — Encounter: Payer: Self-pay | Admitting: Cardiovascular Disease

## 2023-07-20 ENCOUNTER — Telehealth (HOSPITAL_COMMUNITY): Payer: Self-pay | Admitting: Internal Medicine

## 2023-08-07 ENCOUNTER — Other Ambulatory Visit: Payer: Self-pay | Admitting: Family

## 2023-08-10 ENCOUNTER — Telehealth: Payer: Self-pay | Admitting: Family Medicine

## 2023-08-10 NOTE — Telephone Encounter (Signed)
 Copied from CRM [id number]. Topic: Clinical - Medication Refill >> Aug 10, 2023  1:49 PM Almira Coaster wrote: Most Recent Primary Care Visit:  Provider: Danise Edge A  Department: [facility]  Visit Type: OFFICE VISIT  Date: 06/27/2023  Medication: temazepam (RESTORIL) 30 MG capsule  Has the patient contacted their pharmacy? Yes, they sent a request to the provider's office but no response. (Agent: If no, request that the patient contact the pharmacy for the refill. If patient does not wish to contact the pharmacy document the reason why and proceed with request.) (Agent: If yes, when and what did the pharmacy advise?)  Is this the correct pharmacy for this prescription? Yes If no, delete pharmacy and type the correct one.  This is the patient's preferred pharmacy:  [facility] [address], [place] - [address] [place] [address] Phone: [phone] Fax: [phone]     Has the prescription been filled recently? No  Is the patient out of the medication? Yes  Has the patient been seen for an appointment in the last year OR does the patient have an upcoming appointment? Yes  Can we respond through MyChart? Yes  Agent: Please be advised that Rx refills may take up to 3 business days. We ask that you follow-up with your pharmacy.

## 2023-08-11 ENCOUNTER — Other Ambulatory Visit: Payer: Self-pay | Admitting: Family

## 2023-08-11 MED ORDER — TEMAZEPAM 30 MG PO CAPS: 30.0000 mg | ORAL_CAPSULE | Freq: Every day | ORAL | 1 refills | Status: DC

## 2023-08-11 NOTE — Telephone Encounter (Signed)
 Pt calling to check status of refill. He is currently at the pharmacy.

## 2023-08-15 ENCOUNTER — Other Ambulatory Visit: Payer: Self-pay | Admitting: Internal Medicine

## 2023-08-15 DIAGNOSIS — E1169 Type 2 diabetes mellitus with other specified complication: Secondary | ICD-10-CM

## 2023-08-23 DIAGNOSIS — E349 Endocrine disorder, unspecified: Secondary | ICD-10-CM | POA: Diagnosis not present

## 2023-08-23 DIAGNOSIS — R972 Elevated prostate specific antigen [PSA]: Secondary | ICD-10-CM | POA: Diagnosis not present

## 2023-08-24 ENCOUNTER — Ambulatory Visit: Payer: Medicare HMO | Admitting: Podiatry

## 2023-08-24 ENCOUNTER — Other Ambulatory Visit: Payer: Self-pay | Admitting: *Deleted

## 2023-08-24 ENCOUNTER — Encounter: Payer: Self-pay | Admitting: Podiatry

## 2023-08-24 DIAGNOSIS — E1142 Type 2 diabetes mellitus with diabetic polyneuropathy: Secondary | ICD-10-CM

## 2023-08-24 DIAGNOSIS — M79675 Pain in left toe(s): Secondary | ICD-10-CM | POA: Diagnosis not present

## 2023-08-24 DIAGNOSIS — M79674 Pain in right toe(s): Secondary | ICD-10-CM

## 2023-08-24 DIAGNOSIS — B351 Tinea unguium: Secondary | ICD-10-CM

## 2023-08-24 DIAGNOSIS — I351 Nonrheumatic aortic (valve) insufficiency: Secondary | ICD-10-CM

## 2023-08-24 DIAGNOSIS — I77810 Thoracic aortic ectasia: Secondary | ICD-10-CM

## 2023-08-24 NOTE — Progress Notes (Signed)
  Subjective:  Patient ID: [name], male    DOB: [birth year],  MRN: [id number]  73 y.o. male presents at risk foot care with history of diabetic neuropathy and painful, elongated thickened toenails x 10 which are symptomatic when wearing enclosed shoe gear. This interferes with his/her daily activities.  Chief Complaint  Patient presents with   Diabetes    "I'm here for the routine nail trimming."  Saw Dr. Carlus Pavlov - 05/10/2023, A1c - 5.7   New problem(s): None   PCP is Bradd Canary, MD , and last visit was June 27, 2023.  Allergies  Allergen Reactions   Sulfonamide Derivatives Other (See Comments)    Flu like symptoms    Zolpidem Other (See Comments)    Excessive, prolonged sedation    Review of Systems: Negative except as noted in the HPI.   Objective:  [name] is a pleasant 73 y.o. male WD, WN in NAD. AAO x 3.  Vascular Examination: Vascular status intact b/l with palpable pedal pulses. CFT immediate b/l. Pedal hair decreased. No edema. No pain with calf compression b/l. Skin temperature gradient WNL b/l. No varicosities noted. No cyanosis or clubbing noted.  Neurological Examination: Pt has subjective symptoms of neuropathy. Protective sensation intact 5/5 intact bilaterally with 10g monofilament b/l.  Dermatological Examination: Pedal skin with normal turgor, texture and tone b/l. No open wounds nor interdigital macerations noted. Toenails 1-5 b/l thick, discolored, elongated with subungual debris and pain on dorsal palpation. No hyperkeratotic lesions noted b/l.   Musculoskeletal Examination: Muscle strength 5/5 to b/l LE.  No pain, crepitus noted b/l. Hammertoe deformity noted 2-5 b/l. Patient ambulates independently without assistive aids.   Radiographs: None  Last A1c:      Latest Ref Rng & Units 05/10/2023   10:16 AM 01/31/2023   10:48 AM 01/20/2023   12:19 PM 09/21/2022    4:50 PM  Hemoglobin A1C  Hemoglobin-A1c 4.0 - 5.6 % 5.7  6.5   6.5  6.2      Assessment:   1. Pain due to onychomycosis of toenails of both feet   2. Diabetic peripheral neuropathy associated with type 2 diabetes mellitus (HCC)    Plan:  Patient was evaluated and treated. All patient's and/or POA's questions/concerns addressed on today's visit. Toenails 1-5 debrided in length and girth without incident. Continue foot and shoe inspections daily. Monitor blood glucose per PCP/Endocrinologist's recommendations. Continue soft, supportive shoe gear daily. Report any pedal injuries to medical professional. Call office if there are any questions/concerns. -Patient/POA to call should there be question/concern in the interim.  Return in about 3 months (around 11/24/2023).  Freddie Breech, DPM      [place] LOCATION: [address], [place] [address]                   Office [phone]   [facility] LOCATION: [address] [place], [place] [address] Office [phone]

## 2023-09-08 ENCOUNTER — Other Ambulatory Visit: Payer: Self-pay | Admitting: Internal Medicine

## 2023-09-08 DIAGNOSIS — I480 Paroxysmal atrial fibrillation: Secondary | ICD-10-CM

## 2023-09-18 ENCOUNTER — Other Ambulatory Visit: Payer: Self-pay | Admitting: Cardiology

## 2023-09-18 ENCOUNTER — Other Ambulatory Visit (HOSPITAL_COMMUNITY): Payer: Self-pay | Admitting: Family Medicine

## 2023-09-18 ENCOUNTER — Other Ambulatory Visit: Payer: Self-pay | Admitting: Family Medicine

## 2023-09-22 ENCOUNTER — Other Ambulatory Visit: Payer: Self-pay | Admitting: Family Medicine

## 2023-09-22 NOTE — Telephone Encounter (Signed)
 Requesting: lorazepam 1mg   Contract:  09/21/22 UDS: 09/21/22 Last Visit: 06/27/23 Next Visit: 02/16/24 Last Refill: 07/18/23 #30 and 0RF   Please Advise

## 2023-10-09 ENCOUNTER — Other Ambulatory Visit: Payer: Self-pay | Admitting: Family

## 2023-10-11 ENCOUNTER — Telehealth: Payer: Self-pay

## 2023-10-11 NOTE — Telephone Encounter (Signed)
 Patient Assistance  Medication:Ozempic Dosage:0.25/0.5mg   Quantity:4 boxes

## 2023-10-12 ENCOUNTER — Telehealth (HOSPITAL_COMMUNITY): Payer: Self-pay | Admitting: Internal Medicine

## 2023-10-12 ENCOUNTER — Telehealth: Payer: Self-pay | Admitting: Family Medicine

## 2023-10-12 ENCOUNTER — Other Ambulatory Visit: Payer: Self-pay | Admitting: Family Medicine

## 2023-10-12 MED ORDER — TEMAZEPAM 30 MG PO CAPS: 30.0000 mg | ORAL_CAPSULE | Freq: Every day | ORAL | 1 refills | Status: DC

## 2023-10-12 NOTE — Telephone Encounter (Signed)
 Copied from CRM [id number]. Topic: Clinical - Medication Refill >> Oct 12, 2023 10:08 AM Caliyah H wrote: Medication: temazepam  (RESTORIL )  Has the patient contacted their pharmacy? Yes (Agent: If no, request that the patient contact the pharmacy for the refill. If patient does not wish to contact the pharmacy document the reason why and proceed with request.) (Agent: If yes, when and what did the pharmacy advise?)  This is the patient's preferred pharmacy:  [facility] [address] [place] - [address] [place] [address] Phone: [phone] Fax: [phone]  Is this the correct pharmacy for this prescription? Yes If no, delete pharmacy and type the correct one.   Has the prescription been filled recently? Yes  Is the patient out of the medication? Yes  Has the patient been seen for an appointment in the last year OR does the patient have an upcoming appointment? Yes scheduled for 11/07/23   Can we respond through MyChart? Yes  Agent: Please be advised that Rx refills may take up to 3 business days. We ask that you follow-up with your pharmacy.

## 2023-10-12 NOTE — Telephone Encounter (Signed)
 Called to confirm/remind patient of their appointment at the [facility] on 10/12/2023.   Appointment:   [x] Confirmed  [] Left mess   [] No answer/No voice mail  [] VM Full/unable to leave message  [] Phone not in service  Patient reminded to bring all medications and/or complete list.  Confirmed patient has transportation. Gave directions, instructed to utilize valet parking.

## 2023-10-12 NOTE — Telephone Encounter (Unsigned)
 Copied from CRM [id number]. Topic: Clinical - Medication Refill >> Oct 12, 2023 10:08 AM Caliyah H wrote: Medication: temazepam  (RESTORIL )  Has the patient contacted their pharmacy? Yes (Agent: If no, request that the patient contact the pharmacy for the refill. If patient does not wish to contact the pharmacy document the reason why and proceed with request.) (Agent: If yes, when and what did the pharmacy advise?)  This is the patient's preferred pharmacy:  [facility] [address] [place] - [address] [place] [address] Phone: [phone] Fax: [phone]  Is this the correct pharmacy for this prescription? Yes If no, delete pharmacy and type the correct one.   Has the prescription been filled recently? Yes  Is the patient out of the medication? Yes  Has the patient been seen for an appointment in the last year OR does the patient have an upcoming appointment? Yes scheduled for 11/07/23   Can we respond through MyChart? Yes  Agent: Please be advised that Rx refills may take up to 3 business days. We ask that you follow-up with your pharmacy.

## 2023-10-12 NOTE — Telephone Encounter (Signed)
 Last Fill: 08/11/23  Last OV: 06/27/23 Next OV: 11/07/23  Routing to provider for review/authorization.

## 2023-10-12 NOTE — Telephone Encounter (Signed)
 Requesting: temazepam  30mg   Contract: 09/21/22 UDS: 09/21/22 Last Visit: 06/27/23 Next Visit: 11/07/23 Last Refill: 08/11/23 #30 and 1RF   Please Advise

## 2023-10-13 ENCOUNTER — Encounter (HOSPITAL_COMMUNITY): Payer: Self-pay | Admitting: Internal Medicine

## 2023-10-13 ENCOUNTER — Ambulatory Visit (HOSPITAL_COMMUNITY)
Admission: RE | Admit: 2023-10-13 | Discharge: 2023-10-13 | Disposition: A | Discharge status: Home / Self Care | Source: Ambulatory Visit | Attending: Internal Medicine | Admitting: Internal Medicine

## 2023-10-13 VITALS — BP 110/68 | HR 79 | Ht 71.0 in | Wt 192.2 lb

## 2023-10-13 DIAGNOSIS — I2510 Atherosclerotic heart disease of native coronary artery without angina pectoris: Secondary | ICD-10-CM

## 2023-10-13 DIAGNOSIS — Z7901 Long term (current) use of anticoagulants: Secondary | ICD-10-CM | POA: Insufficient documentation

## 2023-10-13 DIAGNOSIS — I4819 Other persistent atrial fibrillation: Secondary | ICD-10-CM | POA: Insufficient documentation

## 2023-10-13 DIAGNOSIS — Z8249 Family history of ischemic heart disease and other diseases of the circulatory system: Secondary | ICD-10-CM | POA: Insufficient documentation

## 2023-10-13 DIAGNOSIS — Z7984 Long term (current) use of oral hypoglycemic drugs: Secondary | ICD-10-CM | POA: Insufficient documentation

## 2023-10-13 DIAGNOSIS — Z833 Family history of diabetes mellitus: Secondary | ICD-10-CM | POA: Insufficient documentation

## 2023-10-13 DIAGNOSIS — Z79899 Other long term (current) drug therapy: Secondary | ICD-10-CM | POA: Insufficient documentation

## 2023-10-13 DIAGNOSIS — E1122 Type 2 diabetes mellitus with diabetic chronic kidney disease: Secondary | ICD-10-CM | POA: Diagnosis not present

## 2023-10-13 DIAGNOSIS — G4733 Obstructive sleep apnea (adult) (pediatric): Secondary | ICD-10-CM | POA: Diagnosis not present

## 2023-10-13 DIAGNOSIS — N1831 Chronic kidney disease, stage 3a: Secondary | ICD-10-CM | POA: Insufficient documentation

## 2023-10-13 DIAGNOSIS — I130 Hypertensive heart and chronic kidney disease with heart failure and stage 1 through stage 4 chronic kidney disease, or unspecified chronic kidney disease: Secondary | ICD-10-CM | POA: Insufficient documentation

## 2023-10-13 DIAGNOSIS — I5022 Chronic systolic (congestive) heart failure: Secondary | ICD-10-CM | POA: Diagnosis not present

## 2023-10-13 DIAGNOSIS — I351 Nonrheumatic aortic (valve) insufficiency: Secondary | ICD-10-CM | POA: Diagnosis not present

## 2023-10-13 DIAGNOSIS — I480 Paroxysmal atrial fibrillation: Secondary | ICD-10-CM

## 2023-10-13 DIAGNOSIS — Z7985 Long-term (current) use of injectable non-insulin antidiabetic drugs: Secondary | ICD-10-CM | POA: Insufficient documentation

## 2023-10-13 NOTE — Telephone Encounter (Signed)
 Patient picked up patient assistance - Log Noted

## 2023-10-13 NOTE — Progress Notes (Signed)
 [facility] Note   Referring Physician: PCP: Neda Balk, MD PCP-Cardiologist: Luana Rumple, MD  [facility]: Dr. 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   Admitted 05/16/19 for PVI with successful procedure. Post procedure he developed cardiogenic shock and respiratory failure. CCM consulted for intubation. Advanced HF team consulted. As he improved he was extubated.  Treated with pressors and gradually weaned off. SCr peaking to 2.2. Tolerated Bidil  and digoxin . ECHO initially showed EF 10% but improved 35-40% prior to discharge. Was discharged home on 05/29/19.  He went back in A fib 08/02/19. Saw pulmonary on 08/02/19 and was noted to have many central events. CPAP was to be changed to auto titration.  On 08/16/19 he had successful cardioversion for afib.   Repeat PVI in 10/21. Has remained in NSR  Follows with Dr. Croitruro. We have not seen him since 1/23  Echo 2/25 EF 50-55% mod AI mild MR Normal RV RVSP 50   He presents back for f/u. Feels great. Still doing his woodwork. Remains active. No CP or SOB. No edema, orthopnea or PND. Wears CPAP religiously.   . Past Medical History:  Diagnosis Date   AORTIC STENOSIS, MODERATE 12/05/2009   Arthritis    Atrial fibrillation (HCC)    Atrial fibrillation with RVR (HCC) 07/05/2017   Atrial flutter (HCC) 12/05/2009   Atypical chest pain 11/08/2011   BENIGN PROSTATIC HYPERTROPHY, HX OF 12/05/2009   CHEST PAIN-UNSPECIFIED 11/11/2009   CHF 12/05/2009   Chronic renal insufficiency 12/16/2016   Debility 04/18/2012   Diarrhea 12/16/2016   DYSPNEA 12/19/2009   Edema 04/18/2012   ERECTILE DYSFUNCTION 01/25/2007   FATIGUE, CHRONIC 11/11/2009   Gout 07/10/2012   Heart murmur    History of kidney stones    "they passed" (07/05/2017)   Hyperkalemia 04/18/2012   Hyperlipidemia    Hypertension    HYPERTENSION 01/25/2007   Hypoxia  05/05/2011   Insomnia 05/13/2016   INSOMNIA, HX OF 01/25/2007   Knee pain, bilateral 12/28/2010   Knee pain, right 12/28/2010   NEUROMA 01/05/2010   OBESITY NOS 01/25/2007   OSA on CPAP 04/06/2010   PERIPHERAL NEUROPATHY 01/05/2010   PULMONARY FUNCTION TESTS, ABNORMAL 02/02/2010   Sleep apnea    wears CPAP   Superficial thrombophlebitis of left leg 09/07/2011   TESTICULAR HYPOFUNCTION 01/05/2010   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 1 Inhaler 6   Alcohol  Swabs  (DROPSAFE ALCOHOL  PREP) 70 % PADS USE AS DIRECTED 300 each 3   allopurinol  (ZYLOPRIM ) 100 MG tablet Take 1 tablet by mouth once daily 90 tablet 0   apixaban  (ELIQUIS ) 5 MG TABS tablet Take 1 tablet by mouth twice daily 60 tablet 2   Blood Glucose Monitoring Suppl (ACCU-CHEK GUIDE ME) w/Device KIT USE TO TEST BLOOD SUGAR ONCE TO TWICE DAILY 1 kit 0   calcium  carbonate (TUMS - DOSED IN MG ELEMENTAL CALCIUM ) 500 MG chewable tablet Chew 500 mg by mouth daily as needed for indigestion or heartburn.  cetirizine  (ZYRTEC ) 10 MG tablet Take 1 tablet (10 mg total) by mouth daily. 30 tablet 11   clobetasol  ointment (TEMOVATE ) 0.05 % 1 application to affected area     Clobetasol  Prop Emollient Base (CLOBETASOL  PROPIONATE E) 0.05 % emollient cream Apply 1 application topically 2 (two) times daily as needed (insect bite). 60 g 1   colchicine  (COLCRYS ) 0.6 MG tablet Take one tablet for 4-6 days as needed for gout flare up. 15 tablet 3   dapagliflozin  propanediol (FARXIGA ) 10 MG TABS tablet Take 1 tablet (10 mg total) by mouth daily. 90 tablet 3   fish oil-omega-3  fatty acids 1000 MG capsule Take 1 g by mouth daily.     furosemide  (LASIX ) 80 MG tablet TAKE 1 TABLET BY MOUTH ONCE DAILY AS NEEDED. MAY TAKE AN ADDITIONAL 40MG  (1/2 TABLET) AS DIRECTED PER ALLEVIATE RESEARCH STUDY PRN PLAN. 135 tablet 3   gabapentin  (NEURONTIN ) 100 MG capsule Take 2 capsules (200 mg total) by mouth at bedtime. 180 capsule 3   glucose blood (ACCU-CHEK GUIDE TEST) test strip USE TO TEST BLOOD SUGAR ONCE TO TWICE DAILY 200 each 3   hydrALAZINE  (APRESOLINE ) 10 MG tablet TAKE 1 TABLET BY MOUTH THREE TIMES DAILY 200 tablet 1   HYDROcodone -acetaminophen  (NORCO) 10-325 MG tablet Take 1 tablet by mouth every 8 (eight) hours as needed for moderate pain or severe pain. 90 tablet 0   hydrocortisone  2.5 % ointment Apply topically 2 (two) times daily as needed. 30 g 1   isosorbide  mononitrate (IMDUR ) 30 MG 24 hr tablet Take 1 tablet (30 mg total) by mouth daily. 90 tablet 3   LORazepam  (ATIVAN ) 1 MG tablet TAKE 1 TABLET BY MOUTH EVERY 8 HOURS AS NEEDED FOR ANXIETY 30 tablet 0   losartan  (COZAAR ) 25 MG tablet Take 0.5 tablets (12.5 mg total) by mouth daily. 45 tablet 3   magnesium  oxide (MAG-OX) 400 MG tablet Take 1 tablet (400 mg total) by mouth 2 (two) times daily. 30 tablet 11   metoprolol  succinate (TOPROL -XL) 50 MG 24 hr tablet TAKE 1 TABLET BY MOUTH IN THE MORNING AND 1 AT BEDTIME (TAKE  WITH  OR  FOLLOWING  A  MEAL) 180 tablet 3   Multiple Vitamin (MULTIVITAMIN WITH MINERALS) TABS tablet Take 1 tablet by mouth daily.      potassium chloride  SA (KLOR-CON  M20) 20 MEQ tablet Take 1 tablet (20 mEq total) by mouth as directed. 1 tablet daily 20 mEq by mouth as directed per alleviate research protocol. 90 tablet 3   pravastatin  (PRAVACHOL ) 40 MG tablet Take 1 tablet by mouth once daily 90 tablet 3   Semaglutide ,0.25 or 0.5MG /DOS, 2 MG/3ML SOPN Inject 0.5 mg into the skin once a week.     tamsulosin  (FLOMAX ) 0.4 MG CAPS capsule Take 1 capsule by mouth once daily 30 capsule 0   temazepam   (RESTORIL ) 30 MG capsule Take 1 capsule (30 mg total) by mouth at bedtime. 30 capsule 1   tiZANidine  (ZANAFLEX ) 2 MG tablet Take 0.5-1 tablets (1-2 mg total) by mouth at bedtime as needed for muscle spasms. 30 tablet 1   VITAMIN D PO Take by mouth.     No current facility-administered medications for this encounter.    Allergies  Allergen Reactions   Sulfonamide Derivatives Other (See Comments)    Flu like symptoms    Zolpidem  Other (See Comments)    Excessive, prolonged sedation      Social History   Socioeconomic History   Marital status:  Married    Spouse name: Not on file   Number of children: 4   Years of education: 12   Highest education level: High school graduate  Occupational History   Occupation: retired    Associate Professor: [employer]  Tobacco Use   Smoking status: Former    Current packs/day: 0.00    Average packs/day: 1 pack/day for 10.0 years (10.0 ttl pk-yrs)    Types: Cigarettes    Start date: 06/07/1970    Quit date: 06/07/1980    Years since quitting: 43.3   Smokeless tobacco: Never  Vaping Use   Vaping status: Never Used  Substance and Sexual Activity   Alcohol  use: No   Drug use: No   Sexual activity: Not Currently  Other Topics Concern   Not on file  Social History Narrative   Lives with male partner in a one story home.  His son lives there off and on.  Retired from [employer].  Education: high school.  Right handed   Social Drivers of Health   Financial Resource Strain: Not on file  Food Insecurity: Not on file  Transportation Needs: Not on file  Physical Activity: Not on file  Stress: Stress Concern Present (04/05/2022)   [employer] of Occupational Health - Occupational Stress Questionnaire    Feeling of Stress : To some extent  Social Connections: Not on file  Intimate Partner Violence: Not on file      Family History  Problem Relation Age of Onset   Clotting disorder Mother    Heart disease Mother        s/p MI   Heart attack  Mother    Hypertension Mother    Diabetes Mother    Hyperlipidemia Mother    Stroke Mother    Cancer Father        ? lung   Lung disease Father        smoker   Diabetes Sister    Hypertension Sister        smoker   Aneurysm Sister        brain   Other Sister        clipped   Seizures Sister        d/o w/aneurysm/ smoker   Leukemia Maternal Grandmother        ?   Colon cancer Neg Hx    Colon polyps Neg Hx    Stomach cancer Neg Hx    Rectal cancer Neg Hx    Esophageal cancer Neg Hx     Vitals:   10/13/23 1028  BP: 110/68  Pulse: 79  SpO2: 99%  Weight: 87.2 kg (192 lb 3.2 oz)  Height: 5\' 11"  (1.803 m)     Wt Readings from Last 3 Encounters:  10/13/23 87.2 kg (192 lb 3.2 oz)  06/27/23 86.8 kg (191 lb 6.4 oz)  06/20/23 86.7 kg (191 lb 3.2 oz)    PHYSICAL EXAM: General:  Well appearing. No resp difficulty HEENT: normal Neck: supple. no JVD. Carotids 2+ bilat; no bruits. No lymphadenopathy or thryomegaly appreciated. Cor: PMI nondisplaced. Regular rate & rhythm. 2/6 AI Lungs: clear Abdomen: soft, nontender, nondistended. No hepatosplenomegaly. No bruits or masses. Good bowel sounds. Extremities: no cyanosis, clubbing, rash, edema Neuro: alert & orientedx3, cranial nerves grossly intact. moves all 4 extremities w/o difficulty. Affect pleasant    ASSESSMENT & PLAN:  1. 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  EF 10% by TEE 12/9 likely due to tachy CM Initially on pressors after PVI.  - Limited ECHO completed on 05/28/19 and showed EF improving with SR, 40-45%.  - ECHO 07/20/19: EF 35-40%  - TEE 10/21 EF 35-40% - Doing great NYHA I - Volume status stable on lasix  80 daily. - GDMT previously limited by CKD however SCr has improved from 2.1 -> 1.5 May be candidate for ARNI/ARB however BP has been too low  - Continue Farxiga  10  - Continue  Toprol  50mg  bid  - Continue Hydralazine  25 tid + Imdur  30 daily.    2. Persistent AF - s/p  PVI 12/19. Repeat PVI in 10/21 - Maintaining NSR. Now off amio  - EP following (Dr. Lawana Pray) - Continue Eliquis . No bleeding - Continue Toprol  XL    3. CKD 3a - Scr 2.15 on 09/18/19 - More recently SCr 1.5-1.6 range - Follows with Nephrology in [place] - Scr satble  at 1.6 on 06/27/23  4. OSA - Compliant w/Bipap  5. DM2 - last HgBa1c 5.9% - Continue Farxiga   6. Aortic insufficiency - moderate - longstanding, stable on recent echo   He wil continue to follow with Dr. Croituro. We will see as needed.   Jules Oar, MD 10/13/23

## 2023-10-13 NOTE — Patient Instructions (Signed)
  Follow-Up in: 2 YEARS PLEASE CALL US  CLOSER TO TIME TO GET SCHEDULED FOR THIS   At the [facility], you and your health needs are our priority. We have a designated team specialized in the treatment of Heart Failure. This Care Team includes your primary Heart Failure Specialized Cardiologist (physician), Advanced Practice Providers (APPs- Physician Assistants and Nurse Practitioners), and Pharmacist who all work together to provide you with the care you need, when you need it.   You may see any of the following providers on your designated Care Team at your next follow up:  Dr. Jules Oar Dr. Peder Bourdon Dr. Alwin Baars Dr. Judyth Nunnery Nieves Bars, NP Ruddy Corral, [place] [facility] [place], [place] Dennise Fitz, NP [place] Lee, NP Luster Salters, PharmD   Please be sure to bring in all your medications bottles to every appointment.   Need to Contact Us :  If you have any questions or concerns before your next appointment please send us  a message through [place] or call our office at [phone].    TO LEAVE A MESSAGE FOR THE NURSE SELECT OPTION 2, PLEASE LEAVE A MESSAGE INCLUDING: YOUR NAME DATE OF BIRTH CALL BACK NUMBER REASON FOR CALL**this is important as we prioritize the call backs  YOU WILL RECEIVE A CALL BACK THE SAME DAY AS LONG AS YOU CALL BEFORE 4:00 PM

## 2023-10-19 ENCOUNTER — Encounter: Payer: Self-pay | Admitting: *Deleted

## 2023-11-06 NOTE — Assessment & Plan Note (Signed)
 Hydrate and monitor

## 2023-11-06 NOTE — Assessment & Plan Note (Signed)
 hgba1c acceptable, minimize simple carbs. Increase exercise as tolerated.

## 2023-11-06 NOTE — Assessment & Plan Note (Signed)
 Well controlled, no changes to meds. Encouraged heart healthy diet such as the DASH diet and exercise as tolerated.

## 2023-11-06 NOTE — Assessment & Plan Note (Signed)
Tolerating Eliquis 

## 2023-11-06 NOTE — Assessment & Plan Note (Signed)
Encourage heart healthy diet such as MIND or DASH diet, increase exercise, avoid trans fats, simple carbohydrates and processed foods, consider a krill or fish or flaxseed oil cap daily. Pravastatin tolerating

## 2023-11-06 NOTE — Assessment & Plan Note (Signed)
Rate controlled and tolerating current meds, follows with cardiology

## 2023-11-06 NOTE — Assessment & Plan Note (Signed)
 No recent exacerbation, follows with cards/CHF clinic

## 2023-11-07 ENCOUNTER — Encounter: Payer: Self-pay | Admitting: Family Medicine

## 2023-11-07 ENCOUNTER — Ambulatory Visit (INDEPENDENT_AMBULATORY_CARE_PROVIDER_SITE_OTHER): Payer: Medicare HMO | Admitting: Family Medicine

## 2023-11-07 VITALS — BP 132/68 | HR 77 | Resp 16 | Ht 71.0 in | Wt 194.4 lb

## 2023-11-07 DIAGNOSIS — E1122 Type 2 diabetes mellitus with diabetic chronic kidney disease: Secondary | ICD-10-CM | POA: Diagnosis not present

## 2023-11-07 DIAGNOSIS — D6869 Other thrombophilia: Secondary | ICD-10-CM

## 2023-11-07 DIAGNOSIS — N2581 Secondary hyperparathyroidism of renal origin: Secondary | ICD-10-CM

## 2023-11-07 DIAGNOSIS — N1832 Chronic kidney disease, stage 3b: Secondary | ICD-10-CM | POA: Diagnosis not present

## 2023-11-07 DIAGNOSIS — I5042 Chronic combined systolic (congestive) and diastolic (congestive) heart failure: Secondary | ICD-10-CM | POA: Diagnosis not present

## 2023-11-07 DIAGNOSIS — I10 Essential (primary) hypertension: Secondary | ICD-10-CM

## 2023-11-07 DIAGNOSIS — I4891 Unspecified atrial fibrillation: Secondary | ICD-10-CM | POA: Diagnosis not present

## 2023-11-07 DIAGNOSIS — E782 Mixed hyperlipidemia: Secondary | ICD-10-CM

## 2023-11-07 DIAGNOSIS — M1A9XX0 Chronic gout, unspecified, without tophus (tophi): Secondary | ICD-10-CM | POA: Diagnosis not present

## 2023-11-07 DIAGNOSIS — R972 Elevated prostate specific antigen [PSA]: Secondary | ICD-10-CM

## 2023-11-07 MED ORDER — LORAZEPAM 1 MG PO TABS: 1.0000 mg | ORAL_TABLET | Freq: Three times a day (TID) | ORAL | 3 refills | Status: DC | PRN

## 2023-11-07 NOTE — Patient Instructions (Signed)
 Tetanus booster is due Shingrix is the new shingles shot, 2 shots over 2-6 months, confirm coverage with insurance and document, then can return here for shots with nurse appt or at pharmacy  RSV, Respiratory Syncitial Virus vaccine, Arexvy at pharmacy

## 2023-11-07 NOTE — Assessment & Plan Note (Signed)
 Is now following with Dr Ranae Burrow.

## 2023-11-07 NOTE — Progress Notes (Signed)
 Subjective:    Patient ID: Unk Garb, male    DOB: 1950-11-21, 73 y.o.   MRN: [id number]  Chief Complaint  Patient presents with   Medical Management of Chronic Issues    Patient presents today for a 4 month follow-up.   Quality Metric Gaps    AWV, TDAP, zoster    HPI Discussed the use of AI scribe software for clinical note transcription with the patient, who gave verbal consent to proceed.  History of Present Illness [name] is a 73 year old male who presents for a medication refill and routine follow-up.  He requires a refill of lorazepam , which he takes one tablet daily, occasionally two depending on circumstances. The medication aids in sleep, and he has no concerns about falls or confusion. He manages his medications using a weekly pill box to prevent accidental double dosing, especially when unwell. No falls, confusion, or other concerns related to medication use.  His PSA levels are stable, and he is under the care of [facility]. He is scheduled for a PSA test on the eleventh.  He is considering vaccinations, including tetanus, shingles, and RSV.  Family history includes his father who had complications with medication management following multiple surgeries and infections.  He is retired.    Past Medical History:  Diagnosis Date   AORTIC STENOSIS, MODERATE 12/05/2009   Arthritis    Atrial fibrillation (HCC)    Atrial fibrillation with RVR (HCC) 07/05/2017   Atrial flutter (HCC) 12/05/2009   Atypical chest pain 11/08/2011   BENIGN PROSTATIC HYPERTROPHY, HX OF 12/05/2009   CHEST PAIN-UNSPECIFIED 11/11/2009   CHF 12/05/2009   Chronic renal insufficiency 12/16/2016   Debility 04/18/2012   Diarrhea 12/16/2016   DYSPNEA 12/19/2009   Edema 04/18/2012   ERECTILE DYSFUNCTION 01/25/2007   FATIGUE, CHRONIC 11/11/2009   Gout 07/10/2012   Heart murmur    History of kidney stones    "they passed" (07/05/2017)   Hyperkalemia 04/18/2012    Hyperlipidemia    Hypertension    HYPERTENSION 01/25/2007   Hypoxia 05/05/2011   Insomnia 05/13/2016   INSOMNIA, HX OF 01/25/2007   Knee pain, bilateral 12/28/2010   Knee pain, right 12/28/2010   NEUROMA 01/05/2010   OBESITY NOS 01/25/2007   OSA on CPAP 04/06/2010   PERIPHERAL NEUROPATHY 01/05/2010   PULMONARY FUNCTION TESTS, ABNORMAL 02/02/2010   Sleep apnea    wears CPAP   Superficial thrombophlebitis of left leg 09/07/2011   TESTICULAR HYPOFUNCTION 01/05/2010   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   Toe pain, right 07/10/2012   Type II diabetes mellitus (HCC) 12/05/2009   UNSPECIFIED ANEMIA 01/05/2010    Past Surgical History:  Procedure Laterality Date   A-FLUTTER ABLATION N/A 05/16/2019   Procedure: A-FLUTTER ABLATION;  Surgeon: Lei Pump, MD;  Location: [facility];  Service: Cardiovascular;  Laterality: N/A;   ATRIAL FIBRILLATION ABLATION N/A 05/16/2019   Procedure: ATRIAL FIBRILLATION ABLATION;  Surgeon: Lei Pump, MD;  Location: [facility];  Service: Cardiovascular;  Laterality: N/A;   ATRIAL FIBRILLATION ABLATION N/A 03/10/2020   Procedure: ATRIAL FIBRILLATION ABLATION;  Surgeon: Lei Pump, MD;  Location: [facility];  Service: Cardiovascular;  Laterality: N/A;   CARDIAC CATHETERIZATION  10/16/2009   nonischemic cardiomyopathy   CARDIOVERSION N/A 03/25/2015   Procedure: CARDIOVERSION;  Surgeon: Hazle Lites, MD;  Location: [facility] ENDOSCOPY;  Service: Cardiovascular;  Laterality: N/A;   CARDIOVERSION N/A 06/17/2017   Procedure: CARDIOVERSION;  Surgeon: Luana Rumple,  MD;  Location: [facility] ENDOSCOPY;  Service: Cardiovascular;  Laterality: N/A;   CARDIOVERSION N/A 07/08/2017   Procedure: CARDIOVERSION;  Surgeon: Elmyra Haggard, MD;  Location: [facility] ENDOSCOPY;  Service: Cardiovascular;  Laterality: N/A;   CARDIOVERSION N/A 02/27/2019   Procedure: CARDIOVERSION;  Surgeon: Luana Rumple, MD;  Location: [facility] ENDOSCOPY;  Service:  Cardiovascular;  Laterality: N/A;   CARDIOVERSION N/A 08/16/2019   Procedure: CARDIOVERSION;  Surgeon: Luana Rumple, MD;  Location: [facility] ENDOSCOPY;  Service: Cardiovascular;  Laterality: N/A;   COLONOSCOPY  03/10/2021   1st colonoscopy , Lorella Roles, [facility], large transverse polyp, clip and spot applied   METATARSAL OSTEOTOMY Bilateral ~ 1980   removed part of 5th metatarsal to corect curvature of toes    RIGHT HEART CATH N/A 05/16/2019   Procedure: RIGHT HEART CATH;  Surgeon: Mardell Shade, MD;  Location: [facility];  Service: Cardiovascular;  Laterality: N/A;   TEE WITHOUT CARDIOVERSION  05/16/2019   Procedure: Transesophageal Echocardiogram (Tee);  Surgeon: Lei Pump, MD;  Location: [facility] INVASIVE CV LAB;  Service: Cardiovascular;;   TEE WITHOUT CARDIOVERSION N/A 03/07/2020   Procedure: TRANSESOPHAGEAL ECHOCARDIOGRAM (TEE);  Surgeon: Sonny Dust, MD;  Location: [facility] ENDOSCOPY;  Service: Cardiovascular;  Laterality: N/A;    Family History  Problem Relation Age of Onset   Clotting disorder Mother    Heart disease Mother        s/p MI   Heart attack Mother    Hypertension Mother    Diabetes Mother    Hyperlipidemia Mother    Stroke Mother    Cancer Father        ? lung   Lung disease Father        smoker   Diabetes Sister    Hypertension Sister        smoker   Aneurysm Sister        brain   Other Sister        clipped   Seizures Sister        d/o w/aneurysm/ smoker   Leukemia Maternal Grandmother        ?   Colon cancer Neg Hx    Colon polyps Neg Hx    Stomach cancer Neg Hx    Rectal cancer Neg Hx    Esophageal cancer Neg Hx     Social History   Socioeconomic History   Marital status: Married    Spouse name: Not on file   Number of children: 4   Years of education: 12   Highest education level: High school graduate  Occupational History   Occupation: retired    Associate Professor: [employer]  Tobacco Use   Smoking status: Former    Current  packs/day: 0.00    Average packs/day: 1 pack/day for 10.0 years (10.0 ttl pk-yrs)    Types: Cigarettes    Start date: 06/07/1970    Quit date: 06/07/1980    Years since quitting: 43.4   Smokeless tobacco: Never  Vaping Use   Vaping status: Never Used  Substance and Sexual Activity   Alcohol  use: No   Drug use: No   Sexual activity: Not Currently  Other Topics Concern   Not on file  Social History Narrative   Lives with male partner in a one story home.  His son lives there off and on.  Retired from [employer].  Education: high school.  Right handed   Social Drivers of Health   Financial Resource Strain: Not on file  Food Insecurity: Not on file  Transportation Needs: Not on file  Physical Activity: Not on file  Stress: Stress Concern Present (04/05/2022)   [employer] of Occupational Health - Occupational Stress Questionnaire    Feeling of Stress : To some extent  Social Connections: Not on file  Intimate Partner Violence: Not on file    Outpatient Medications Prior to Visit  Medication Sig Dispense Refill   Accu-Chek Softclix Lancets lancets Use as instructed 200 each 3   albuterol  (VENTOLIN  HFA) 108 (90 Base) MCG/ACT inhaler Inhale 2 puffs into the lungs every 6 (six) hours as needed for wheezing or shortness of breath. 1 Inhaler 6   Alcohol  Swabs  (DROPSAFE ALCOHOL  PREP) 70 % PADS USE AS DIRECTED 300 each 3   allopurinol  (ZYLOPRIM ) 100 MG tablet Take 1 tablet by mouth once daily 90 tablet 0   apixaban  (ELIQUIS ) 5 MG TABS tablet Take 1 tablet by mouth twice daily 60 tablet 2   Blood Glucose Monitoring Suppl (ACCU-CHEK GUIDE ME) w/Device KIT USE TO TEST BLOOD SUGAR ONCE TO TWICE DAILY 1 kit 0   calcium  carbonate (TUMS - DOSED IN MG ELEMENTAL CALCIUM ) 500 MG chewable tablet Chew 500 mg by mouth daily as needed for indigestion or heartburn.     cetirizine  (ZYRTEC ) 10 MG tablet Take 1 tablet (10 mg total) by mouth daily. 30 tablet 11   clobetasol  ointment (TEMOVATE ) 0.05 % 1  application to affected area     Clobetasol  Prop Emollient Base (CLOBETASOL  PROPIONATE E) 0.05 % emollient cream Apply 1 application topically 2 (two) times daily as needed (insect bite). 60 g 1   colchicine  (COLCRYS ) 0.6 MG tablet Take one tablet for 4-6 days as needed for gout flare up. 15 tablet 3   dapagliflozin  propanediol (FARXIGA ) 10 MG TABS tablet Take 1 tablet (10 mg total) by mouth daily. 90 tablet 3   fish oil-omega-3 fatty acids  1000 MG capsule Take 1 g by mouth daily.     furosemide  (LASIX ) 80 MG tablet TAKE 1 TABLET BY MOUTH ONCE DAILY AS NEEDED. MAY TAKE AN ADDITIONAL 40MG  (1/2 TABLET) AS DIRECTED PER ALLEVIATE RESEARCH STUDY PRN PLAN. 135 tablet 3   gabapentin  (NEURONTIN ) 100 MG capsule Take 2 capsules (200 mg total) by mouth at bedtime. 180 capsule 3   glucose blood (ACCU-CHEK GUIDE TEST) test strip USE TO TEST BLOOD SUGAR ONCE TO TWICE DAILY 200 each 3   hydrALAZINE  (APRESOLINE ) 10 MG tablet TAKE 1 TABLET BY MOUTH THREE TIMES DAILY 200 tablet 1   HYDROcodone -acetaminophen  (NORCO) 10-325 MG tablet Take 1 tablet by mouth every 8 (eight) hours as needed for moderate pain or severe pain. 90 tablet 0   hydrocortisone  2.5 % ointment Apply topically 2 (two) times daily as needed. 30 g 1   isosorbide  mononitrate (IMDUR ) 30 MG 24 hr tablet Take 1 tablet (30 mg total) by mouth daily. 90 tablet 3   magnesium  oxide (MAG-OX) 400 MG tablet Take 1 tablet (400 mg total) by mouth 2 (two) times daily. 30 tablet 11   metoprolol  succinate (TOPROL -XL) 50 MG 24 hr tablet TAKE 1 TABLET BY MOUTH IN THE MORNING AND 1 AT BEDTIME (TAKE  WITH  OR  FOLLOWING  A  MEAL) 180 tablet 3   Multiple Vitamin (MULTIVITAMIN WITH MINERALS) TABS tablet Take 1 tablet by mouth daily.      potassium chloride  SA (KLOR-CON  M20) 20 MEQ tablet Take 1 tablet (20 mEq total) by mouth as directed. 1 tablet daily 20 mEq by  mouth as directed per alleviate research protocol. 90 tablet 3   pravastatin  (PRAVACHOL ) 40 MG tablet Take 1  tablet by mouth once daily 90 tablet 3   Semaglutide ,0.25 or 0.5MG /DOS, 2 MG/3ML SOPN Inject 0.5 mg into the skin once a week.     tamsulosin  (FLOMAX ) 0.4 MG CAPS capsule Take 1 capsule by mouth once daily 30 capsule 0   temazepam  (RESTORIL ) 30 MG capsule Take 1 capsule (30 mg total) by mouth at bedtime. 30 capsule 1   tiZANidine  (ZANAFLEX ) 2 MG tablet Take 0.5-1 tablets (1-2 mg total) by mouth at bedtime as needed for muscle spasms. 30 tablet 1   VITAMIN D PO Take by mouth.     LORazepam  (ATIVAN ) 1 MG tablet TAKE 1 TABLET BY MOUTH EVERY 8 HOURS AS NEEDED FOR ANXIETY 30 tablet 0   losartan  (COZAAR ) 25 MG tablet Take 0.5 tablets (12.5 mg total) by mouth daily. 45 tablet 3   No facility-administered medications prior to visit.    Allergies  Allergen Reactions   Sulfonamide Derivatives Other (See Comments)    Flu like symptoms    Zolpidem  Other (See Comments)    Excessive, prolonged sedation    Review of Systems  Constitutional:  Negative for fever and malaise/fatigue.  HENT:  Negative for congestion.   Eyes:  Negative for blurred vision.  Respiratory:  Negative for shortness of breath.   Cardiovascular:  Negative for chest pain, palpitations and leg swelling.  Gastrointestinal:  Negative for abdominal pain, blood in stool and nausea.  Genitourinary:  Negative for dysuria and frequency.  Musculoskeletal:  Negative for falls.  Skin:  Negative for rash.  Neurological:  Negative for dizziness, loss of consciousness and headaches.  Endo/Heme/Allergies:  Negative for environmental allergies.  Psychiatric/Behavioral:  Negative for depression. The patient is not nervous/anxious.        Objective:     Physical Exam Vitals reviewed.  Constitutional:      Appearance: Normal appearance. He is not ill-appearing.  HENT:     Head: Normocephalic and atraumatic.     Nose: Nose normal.  Eyes:     Conjunctiva/sclera: Conjunctivae normal.  Cardiovascular:     Rate and Rhythm: Normal rate.      Pulses: Normal pulses.     Heart sounds: Normal heart sounds. No murmur heard. Pulmonary:     Effort: Pulmonary effort is normal.     Breath sounds: Normal breath sounds. No wheezing.  Abdominal:     Palpations: Abdomen is soft. There is no mass.     Tenderness: There is no abdominal tenderness.  Musculoskeletal:     Cervical back: Normal range of motion.     Right lower leg: No edema.     Left lower leg: No edema.  Skin:    General: Skin is warm and dry.  Neurological:     General: No focal deficit present.     Mental Status: He is alert and oriented to person, place, and time.  Psychiatric:        Mood and Affect: Mood normal.     BP 132/68   Pulse 77   Resp 16   Ht 5\' 11"  (1.803 m)   Wt 194 lb 6.4 oz (88.2 kg)   SpO2 97%   BMI 27.11 kg/m  Wt Readings from Last 3 Encounters:  11/07/23 194 lb 6.4 oz (88.2 kg)  10/13/23 192 lb 3.2 oz (87.2 kg)  06/27/23 191 lb 6.4 oz (86.8 kg)    Diabetic Foot  Exam - Simple   No data filed    Lab Results  Component Value Date   WBC 5.4 06/27/2023   HGB 14.5 06/27/2023   HCT 45.2 06/27/2023   PLT 194.0 06/27/2023   GLUCOSE 126 (H) 06/27/2023   CHOL 155 06/27/2023   TRIG 89.0 06/27/2023   HDL 43.80 06/27/2023   LDLDIRECT 84.0 05/02/2018   LDLCALC 93 06/27/2023   ALT 14 06/27/2023   AST 17 06/27/2023   NA 143 06/27/2023   K 4.1 06/27/2023   CL 102 06/27/2023   CREATININE 1.61 (H) 06/27/2023   BUN 32 (H) 06/27/2023   CO2 31 06/27/2023   TSH 2.02 06/27/2023   PSA 16.25 (H) 01/20/2023   INR 2.2 09/06/2018   HGBA1C 5.7 (A) 05/10/2023   MICROALBUR 1.1 06/27/2023    Lab Results  Component Value Date   TSH 2.02 06/27/2023   Lab Results  Component Value Date   WBC 5.4 06/27/2023   HGB 14.5 06/27/2023   HCT 45.2 06/27/2023   MCV 81.6 06/27/2023   PLT 194.0 06/27/2023   Lab Results  Component Value Date   NA 143 06/27/2023   K 4.1 06/27/2023   CO2 31 06/27/2023   GLUCOSE 126 (H) 06/27/2023   BUN 32 (H)  06/27/2023   CREATININE 1.61 (H) 06/27/2023   BILITOT 0.6 06/27/2023   ALKPHOS 47 06/27/2023   AST 17 06/27/2023   ALT 14 06/27/2023   PROT 7.9 06/27/2023   ALBUMIN  4.7 06/27/2023   CALCIUM  10.0 06/27/2023   ANIONGAP 11 01/21/2023   EGFR 47 (L) 06/09/2022   GFR 43.07 (L) 06/27/2023   Lab Results  Component Value Date   CHOL 155 06/27/2023   Lab Results  Component Value Date   HDL 43.80 06/27/2023   Lab Results  Component Value Date   LDLCALC 93 06/27/2023   Lab Results  Component Value Date   TRIG 89.0 06/27/2023   Lab Results  Component Value Date   CHOLHDL 4 06/27/2023   Lab Results  Component Value Date   HGBA1C 5.7 (A) 05/10/2023       Assessment & Plan:  Atrial fibrillation, unspecified type (HCC) Assessment & Plan: Rate controlled and tolerating current meds, follows with cardiology  Orders: -     TSH  Chronic combined systolic and diastolic heart failure (HCC) Assessment & Plan: No recent exacerbation, follows with cards/CHF clinic  Orders: -     CBC with Differential/Platelet  Type 2 diabetes mellitus with diabetic chronic kidney disease, unspecified CKD stage, unspecified whether long term insulin  use (HCC) Assessment & Plan: hgba1c acceptable, minimize simple carbs. Increase exercise as tolerated.   Orders: -     Hemoglobin A1c -     TSH  Essential hypertension Assessment & Plan: Well controlled, no changes to meds. Encouraged heart healthy diet such as the DASH diet and exercise as tolerated.    Chronic gout without tophus, unspecified cause, unspecified site Assessment & Plan: Hydrate and monitor   Orders: -     Uric acid  Mixed hyperlipidemia Assessment & Plan: Encourage heart healthy diet such as MIND or DASH diet, increase exercise, avoid trans fats, simple carbohydrates and processed foods, consider a krill or fish or flaxseed oil cap daily. Pravastatin  tolerating   Orders: -     Lipid panel  Stage 3b chronic kidney  disease (HCC) Assessment & Plan: Hydrate and monitor   Orders: -     Comprehensive metabolic panel with GFR  Secondary hyperparathyroidism of  renal origin [facility]) Assessment & Plan: Hydrate and monitor   Orders: -     CBC with Differential/Platelet -     Parathyroid  hormone, intact (no Ca)  Secondary hypercoagulable state (HCC) Assessment & Plan: Tolerating Eliquis    PSA elevation Assessment & Plan: Is now following with Dr Ranae Burrow.    Other orders -     LORazepam ; Take 1 tablet (1 mg total) by mouth every 8 (eight) hours as needed. for anxiety  Dispense: 45 tablet; Refill: 3    Assessment and Plan Assessment & Plan Insomnia, unspecified type Chronic insomnia managed with lorazepam . Discussed age-related medication sensitivity and potential sedation effects of benzodiazepines. - Refilled lorazepam  prescription with 45 tablets for daily and occasional breakthrough use. - Instructed to space out lorazepam  and temazepam  to avoid excessive sedation. - Advised use of a pillbox to organize medications and prevent accidental double dosing. - Discussed strategies for medication management during illness to prevent confusion and accidental overdose.  General Health Maintenance Discussed importance and benefits of vaccinations, including tetanus, shingles, and RSV. Addressed vaccine safety and efficacy concerns. - Administered tetanus booster as due. - Consider shingles and RSV vaccines based on discussion.      Randie Bustle, MD

## 2023-11-08 ENCOUNTER — Other Ambulatory Visit (INDEPENDENT_AMBULATORY_CARE_PROVIDER_SITE_OTHER)

## 2023-11-08 ENCOUNTER — Ambulatory Visit: Payer: Self-pay | Admitting: Family Medicine

## 2023-11-08 DIAGNOSIS — N2581 Secondary hyperparathyroidism of renal origin: Secondary | ICD-10-CM | POA: Diagnosis not present

## 2023-11-08 DIAGNOSIS — N1832 Chronic kidney disease, stage 3b: Secondary | ICD-10-CM | POA: Diagnosis not present

## 2023-11-08 DIAGNOSIS — E782 Mixed hyperlipidemia: Secondary | ICD-10-CM | POA: Diagnosis not present

## 2023-11-08 DIAGNOSIS — I5042 Chronic combined systolic (congestive) and diastolic (congestive) heart failure: Secondary | ICD-10-CM | POA: Diagnosis not present

## 2023-11-08 DIAGNOSIS — E1122 Type 2 diabetes mellitus with diabetic chronic kidney disease: Secondary | ICD-10-CM | POA: Diagnosis not present

## 2023-11-08 DIAGNOSIS — I4891 Unspecified atrial fibrillation: Secondary | ICD-10-CM

## 2023-11-08 LAB — CBC WITH DIFFERENTIAL/PLATELET
Basophils Absolute: 0 10*3/uL (ref 0.0–0.1)
Basophils Relative: 1.2 % (ref 0.0–3.0)
Eosinophils Absolute: 0.1 10*3/uL (ref 0.0–0.7)
Eosinophils Relative: 3.8 % (ref 0.0–5.0)
HCT: 40.9 % (ref 39.0–52.0)
Hemoglobin: 13.2 g/dL (ref 13.0–17.0)
Lymphocytes Relative: 43.1 % (ref 12.0–46.0)
Lymphs Abs: 1.7 10*3/uL (ref 0.7–4.0)
MCHC: 32.2 g/dL (ref 30.0–36.0)
MCV: 80.1 fl (ref 78.0–100.0)
Monocytes Absolute: 0.7 10*3/uL (ref 0.1–1.0)
Monocytes Relative: 17.2 % — ABNORMAL HIGH (ref 3.0–12.0)
Neutro Abs: 1.4 10*3/uL (ref 1.4–7.7)
Neutrophils Relative %: 34.7 % — ABNORMAL LOW (ref 43.0–77.0)
Platelets: 201 10*3/uL (ref 150.0–400.0)
RBC: 5.11 Mil/uL (ref 4.22–5.81)
RDW: 15.6 % — ABNORMAL HIGH (ref 11.5–15.5)
WBC: 3.9 10*3/uL — ABNORMAL LOW (ref 4.0–10.5)

## 2023-11-08 LAB — COMPREHENSIVE METABOLIC PANEL WITH GFR
ALT: 13 U/L (ref 0–53)
AST: 17 U/L (ref 0–37)
Albumin: 4.3 g/dL (ref 3.5–5.2)
Alkaline Phosphatase: 42 U/L (ref 39–117)
BUN: 30 mg/dL — ABNORMAL HIGH (ref 6–23)
CO2: 33 meq/L — ABNORMAL HIGH (ref 19–32)
Calcium: 9.9 mg/dL (ref 8.4–10.5)
Chloride: 100 meq/L (ref 96–112)
Creatinine, Ser: 1.68 mg/dL — ABNORMAL HIGH (ref 0.40–1.50)
GFR: 40.82 mL/min — ABNORMAL LOW (ref >60.00)
Glucose, Bld: 108 mg/dL — ABNORMAL HIGH (ref 70–99)
Potassium: 4.7 meq/L (ref 3.5–5.1)
Sodium: 140 meq/L (ref 135–145)
Total Bilirubin: 0.5 mg/dL (ref 0.2–1.2)
Total Protein: 7.4 g/dL (ref 6.0–8.3)

## 2023-11-08 LAB — LIPID PANEL
Cholesterol: 156 mg/dL (ref 0–200)
HDL: 37 mg/dL — ABNORMAL LOW (ref >39.00)
LDL Cholesterol: 91 mg/dL (ref 0–99)
NonHDL: 118.77
Total CHOL/HDL Ratio: 4
Triglycerides: 137 mg/dL (ref 0.0–149.0)
VLDL: 27.4 mg/dL (ref 0.0–40.0)

## 2023-11-08 LAB — HEMOGLOBIN A1C: Hgb A1c MFr Bld: 6.5 % (ref 4.6–6.5)

## 2023-11-08 LAB — PARATHYROID HORMONE, INTACT (NO CA): PTH: 40 pg/mL (ref 16–77)

## 2023-11-08 LAB — TSH: TSH: 1.72 u[IU]/mL (ref 0.35–5.50)

## 2023-11-08 NOTE — Addendum Note (Signed)
 Addended by: Marigene Shoulder on: 11/08/2023 07:14 AM   Modules accepted: Orders

## 2023-11-08 NOTE — Progress Notes (Unsigned)
 Called to see about PTH and make sure it was being processed because we put it back into system by accident EYR

## 2023-11-11 ENCOUNTER — Encounter: Payer: Self-pay | Admitting: Internal Medicine

## 2023-11-11 ENCOUNTER — Ambulatory Visit: Payer: Medicare HMO | Admitting: Internal Medicine

## 2023-11-11 VITALS — BP 120/70 | HR 77 | Ht 71.0 in | Wt 194.2 lb

## 2023-11-11 DIAGNOSIS — E782 Mixed hyperlipidemia: Secondary | ICD-10-CM

## 2023-11-11 DIAGNOSIS — Z7984 Long term (current) use of oral hypoglycemic drugs: Secondary | ICD-10-CM

## 2023-11-11 DIAGNOSIS — E663 Overweight: Secondary | ICD-10-CM | POA: Diagnosis not present

## 2023-11-11 DIAGNOSIS — E1159 Type 2 diabetes mellitus with other circulatory complications: Secondary | ICD-10-CM

## 2023-11-11 DIAGNOSIS — G63 Polyneuropathy in diseases classified elsewhere: Secondary | ICD-10-CM | POA: Diagnosis not present

## 2023-11-11 DIAGNOSIS — Z7985 Long-term (current) use of injectable non-insulin antidiabetic drugs: Secondary | ICD-10-CM | POA: Diagnosis not present

## 2023-11-11 NOTE — Progress Notes (Signed)
 Patient ID: [name], male   DOB: [birth year], 73 y.o.   MRN: [id number]  HPI: [name] is a 73 y.o.-year-old maleyear-old male, initially referred by his cardiologist, Dr. Alvis Ba, returning for follow-up DM2, dx in 2011, non-insulin -dependent, uncontrolled, with complications (CHF, CKD, ED, DR).  Last visit 6 months ago.  Interim hx: No increased urination, blurry vision, nausea, chest pain. He continues on a mostly whole food plant-based diet.  He tells me he has 1 main meal a day, and otherwise he has light meals/snacks.  Reviewed HbA1c levels Lab Results  Component Value Date   HGBA1C 6.5 11/08/2023   HGBA1C 5.7 (A) 05/10/2023   HGBA1C 6.5 01/31/2023   HGBA1C 6.5 01/20/2023   HGBA1C 6.2 09/21/2022   HGBA1C 5.8 (A) 05/04/2022   HGBA1C 5.8 (A) 10/29/2021   HGBA1C 5.9 (A) 06/30/2021   HGBA1C 8.2 (H) 03/06/2021   HGBA1C 7.4 (A) 02/24/2021   HGBA1C 6.9 (A) 10/21/2020   HGBA1C 6.3 (A) 07/22/2020   HGBA1C 8.1 (A) 04/18/2020   HGBA1C 6.6 (A) 12/14/2019   HGBA1C 5.3 09/14/2019   HGBA1C 6.4 06/11/2019   HGBA1C 5.8 02/06/2019   HGBA1C 5.7 08/03/2018   HGBA1C 7.0 (H) 05/02/2018   HGBA1C 6.7 (H) 01/24/2018   HGBA1C 6.2 10/21/2017   HGBA1C 7.2 (H) 03/18/2017   HGBA1C 7.0 (H) 11/15/2016   HGBA1C 7.5 (H) 08/20/2016   HGBA1C 6.8 (H) 05/21/2016   HGBA1C 6.7 (H) 02/18/2016   HGBA1C 6.4 11/17/2015   HGBA1C 6.4 07/15/2015   HGBA1C 6.6 (H) 04/14/2015   HGBA1C 6.1 01/07/2015   Pt is on a regimen of: - Farxiga  10 mg daily-started by Dr. Bensimhon - Ozempic  0.25 >> 0.5 mg weekly - started 03/29/2021 - through [facility] Stopped Glyburide  02/2021.  He was not able to start Ozempic  0.5 mg- 04/2020 - due to price. He was on Metformin  in the past.  Pt checks his sugars once a day: - am:101-105 >> 116, 123 >> 106-130, 137, 152 >> 107-137 - 2h after b'fast: n/c  - before lunch: n/c >> 99-126 >> 101-140 >> n/c > 102 - 2h after lunch: 224>> 93-124  >> n/c >> 146 >> n/c - before dinner: 178-227 >>  n/c >> 91-113 >> n/c >> 99 >> 105 - 2h after dinner: n/c >> 107-161 >> 94 >> n/c - bedtime:  n/c >> 105-119 >> 97-152 >> 112 >> 145 - nighttime: n/c>> 128 >> n/c Lowest sugar was 60 >> ...  92 >> 106; he has hypoglycemia awareness in the 50s.  Highest sugar was 376 (cheese cake) >>...   129 >> 152 >> 145  Glucometer: One Touch verio IQ  Pt's meals are: - Breakfast: oatmeal + berries/apples + cinnamon, cereal, fruit - Lunch: light meal - Dinner:  + veggies, occasionally fried foods (1x a mo) - Snacks:juice >> fruit  -+ CKD-sees Dr. Lamount Pimple, last BUN/creatinine:  Lab Results  Component Value Date   BUN 30 (H) 11/08/2023   BUN 32 (H) 06/27/2023   CREATININE 1.68 (H) 11/08/2023   CREATININE 1.61 (H) 06/27/2023   Lab Results  Component Value Date   GFRAA 42 (L) 05/20/2020   GFRAA 38 (L) 02/25/2020   GFRAA 36 (L) 11/26/2019   GFRAA 39 (L) 11/12/2019   GFRAA 36 (L) 09/18/2019   GFRAA 37 (L) 08/30/2019   GFRAA 39 (L) 08/14/2019   GFRAA 45 (L) 07/11/2019   GFRAA 51 (L) 05/29/2019   GFRAA 58 (L) 05/28/2019  He is not on ACE inhibitor/ARB.  -+  HL; last set of lipids: Lab Results  Component Value Date   CHOL 156 11/08/2023   HDL 37.00 (L) 11/08/2023   LDLCALC 91 11/08/2023   LDLDIRECT 84.0 05/02/2018   TRIG 137.0 11/08/2023   CHOLHDL 4 11/08/2023  On pravastatin  40, omega-3 fatty acids .  - last eye exam was 03/2023: + DR reportedly. Also glaucoma. Dr. Mason Sole.  -+ numbness and tingling in his feet.Dr. Alvis Ba started him on Gabapentin  100 mg daily.  Currently on 100-200 mg daily.  Last foot exam 08/24/2023 (Dr. Denece Finger)  Pt has FH of DM in mother, sister.  He is on amiodarone .  No FH of MTC. No personal hx of pancreatitis.  ROS: + see HPI  I reviewed pt's medications, allergies, PMH, social hx, family hx, and changes were documented in the history of present illness. Otherwise, unchanged from my initial visit note.  Past Medical History:  Diagnosis Date   AORTIC  STENOSIS, MODERATE 12/05/2009   Arthritis    Atrial fibrillation (HCC)    Atrial fibrillation with RVR (HCC) 07/05/2017   Atrial flutter (HCC) 12/05/2009   Atypical chest pain 11/08/2011   BENIGN PROSTATIC HYPERTROPHY, HX OF 12/05/2009   CHEST PAIN-UNSPECIFIED 11/11/2009   CHF 12/05/2009   Chronic renal insufficiency 12/16/2016   Debility 04/18/2012   Diarrhea 12/16/2016   DYSPNEA 12/19/2009   Edema 04/18/2012   ERECTILE DYSFUNCTION 01/25/2007   FATIGUE, CHRONIC 11/11/2009   Gout 07/10/2012   Heart murmur    History of kidney stones    "they passed" (07/05/2017)   Hyperkalemia 04/18/2012   Hyperlipidemia    Hypertension    HYPERTENSION 01/25/2007   Hypoxia 05/05/2011   Insomnia 05/13/2016   INSOMNIA, HX OF 01/25/2007   Knee pain, bilateral 12/28/2010   Knee pain, right 12/28/2010   NEUROMA 01/05/2010   OBESITY NOS 01/25/2007   OSA on CPAP 04/06/2010   PERIPHERAL NEUROPATHY 01/05/2010   PULMONARY FUNCTION TESTS, ABNORMAL 02/02/2010   Sleep apnea    wears CPAP   Superficial thrombophlebitis of left leg 09/07/2011   TESTICULAR HYPOFUNCTION 01/05/2010   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   Toe pain, right 07/10/2012   Type II diabetes mellitus (HCC) 12/05/2009   UNSPECIFIED ANEMIA 01/05/2010   Past Surgical History:  Procedure Laterality Date   A-FLUTTER ABLATION N/A 05/16/2019   Procedure: A-FLUTTER ABLATION;  Surgeon: Lei Pump, MD;  Location: [facility];  Service: Cardiovascular;  Laterality: N/A;   ATRIAL FIBRILLATION ABLATION N/A 05/16/2019   Procedure: ATRIAL FIBRILLATION ABLATION;  Surgeon: Lei Pump, MD;  Location: [facility];  Service: Cardiovascular;  Laterality: N/A;   ATRIAL FIBRILLATION ABLATION N/A 03/10/2020   Procedure: ATRIAL FIBRILLATION ABLATION;  Surgeon: Lei Pump, MD;  Location: [facility];  Service: Cardiovascular;  Laterality: N/A;   CARDIAC CATHETERIZATION  10/16/2009   nonischemic cardiomyopathy    CARDIOVERSION N/A 03/25/2015   Procedure: CARDIOVERSION;  Surgeon: Hazle Lites, MD;  Location: [facility] ENDOSCOPY;  Service: Cardiovascular;  Laterality: N/A;   CARDIOVERSION N/A 06/17/2017   Procedure: CARDIOVERSION;  Surgeon: Luana Rumple, MD;  Location: [facility] ENDOSCOPY;  Service: Cardiovascular;  Laterality: N/A;   CARDIOVERSION N/A 07/08/2017   Procedure: CARDIOVERSION;  Surgeon: Elmyra Haggard, MD;  Location: [facility] ENDOSCOPY;  Service: Cardiovascular;  Laterality: N/A;   CARDIOVERSION N/A 02/27/2019   Procedure: CARDIOVERSION;  Surgeon: Luana Rumple, MD;  Location: [facility] ENDOSCOPY;  Service: Cardiovascular;  Laterality: N/A;   CARDIOVERSION N/A 08/16/2019   Procedure: CARDIOVERSION;  Surgeon: Luana Rumple,  MD;  Location: [facility] ENDOSCOPY;  Service: Cardiovascular;  Laterality: N/A;   COLONOSCOPY  03/10/2021   1st colonoscopy , Lorella Roles, [facility], large transverse polyp, clip and spot applied   METATARSAL OSTEOTOMY Bilateral ~ 1980   removed part of 5th metatarsal to corect curvature of toes    RIGHT HEART CATH N/A 05/16/2019   Procedure: RIGHT HEART CATH;  Surgeon: Mardell Shade, MD;  Location: [facility];  Service: Cardiovascular;  Laterality: N/A;   TEE WITHOUT CARDIOVERSION  05/16/2019   Procedure: Transesophageal Echocardiogram (Tee);  Surgeon: Lei Pump, MD;  Location: [facility] INVASIVE CV LAB;  Service: Cardiovascular;;   TEE WITHOUT CARDIOVERSION N/A 03/07/2020   Procedure: TRANSESOPHAGEAL ECHOCARDIOGRAM (TEE);  Surgeon: Sonny Dust, MD;  Location: [facility] ENDOSCOPY;  Service: Cardiovascular;  Laterality: N/A;   Social History   Socioeconomic History   Marital status: Married    Spouse name: Not on file   Number of children: 4   Years of education: 12   Highest education level: High school graduate  Occupational History   Occupation: retired    Associate Professor: [employer]  Tobacco Use   Smoking status: Former    Current packs/day: 0.00    Average packs/day: 1  pack/day for 10.0 years (10.0 ttl pk-yrs)    Types: Cigarettes    Start date: 06/07/1970    Quit date: 06/07/1980    Years since quitting: 43.4   Smokeless tobacco: Never  Vaping Use   Vaping status: Never Used  Substance and Sexual Activity   Alcohol  use: No   Drug use: No   Sexual activity: Not Currently  Other Topics Concern   Not on file  Social History Narrative   Lives with male partner in a one story home.  His son lives there off and on.  Retired from [employer].  Education: high school.  Right handed   Social Drivers of Health   Financial Resource Strain: Not on file  Food Insecurity: Not on file  Transportation Needs: Not on file  Physical Activity: Not on file  Stress: Stress Concern Present (04/05/2022)   [employer] of Occupational Health - Occupational Stress Questionnaire    Feeling of Stress : To some extent  Social Connections: Not on file  Intimate Partner Violence: Not on file   Current Outpatient Medications on File Prior to Visit  Medication Sig Dispense Refill   Accu-Chek Softclix Lancets lancets Use as instructed 200 each 3   albuterol  (VENTOLIN  HFA) 108 (90 Base) MCG/ACT inhaler Inhale 2 puffs into the lungs every 6 (six) hours as needed for wheezing or shortness of breath. 1 Inhaler 6   Alcohol  Swabs  (DROPSAFE ALCOHOL  PREP) 70 % PADS USE AS DIRECTED 300 each 3   allopurinol  (ZYLOPRIM ) 100 MG tablet Take 1 tablet by mouth once daily 90 tablet 0   apixaban  (ELIQUIS ) 5 MG TABS tablet Take 1 tablet by mouth twice daily 60 tablet 2   Blood Glucose Monitoring Suppl (ACCU-CHEK GUIDE ME) w/Device KIT USE TO TEST BLOOD SUGAR ONCE TO TWICE DAILY 1 kit 0   calcium  carbonate (TUMS - DOSED IN MG ELEMENTAL CALCIUM ) 500 MG chewable tablet Chew 500 mg by mouth daily as needed for indigestion or heartburn.     cetirizine  (ZYRTEC ) 10 MG tablet Take 1 tablet (10 mg total) by mouth daily. 30 tablet 11   clobetasol  ointment (TEMOVATE ) 0.05 % 1 application to affected  area     Clobetasol  Prop Emollient Base (CLOBETASOL  PROPIONATE E)  0.05 % emollient cream Apply 1 application topically 2 (two) times daily as needed (insect bite). 60 g 1   colchicine  (COLCRYS ) 0.6 MG tablet Take one tablet for 4-6 days as needed for gout flare up. 15 tablet 3   dapagliflozin  propanediol (FARXIGA ) 10 MG TABS tablet Take 1 tablet (10 mg total) by mouth daily. 90 tablet 3   fish oil-omega-3 fatty acids  1000 MG capsule Take 1 g by mouth daily.     furosemide  (LASIX ) 80 MG tablet TAKE 1 TABLET BY MOUTH ONCE DAILY AS NEEDED. MAY TAKE AN ADDITIONAL 40MG  (1/2 TABLET) AS DIRECTED PER ALLEVIATE RESEARCH STUDY PRN PLAN. 135 tablet 3   gabapentin  (NEURONTIN ) 100 MG capsule Take 2 capsules (200 mg total) by mouth at bedtime. 180 capsule 3   glucose blood (ACCU-CHEK GUIDE TEST) test strip USE TO TEST BLOOD SUGAR ONCE TO TWICE DAILY 200 each 3   hydrALAZINE  (APRESOLINE ) 10 MG tablet TAKE 1 TABLET BY MOUTH THREE TIMES DAILY 200 tablet 1   HYDROcodone -acetaminophen  (NORCO) 10-325 MG tablet Take 1 tablet by mouth every 8 (eight) hours as needed for moderate pain or severe pain. 90 tablet 0   hydrocortisone  2.5 % ointment Apply topically 2 (two) times daily as needed. 30 g 1   isosorbide  mononitrate (IMDUR ) 30 MG 24 hr tablet Take 1 tablet (30 mg total) by mouth daily. 90 tablet 3   LORazepam  (ATIVAN ) 1 MG tablet Take 1 tablet (1 mg total) by mouth every 8 (eight) hours as needed. for anxiety 45 tablet 3   losartan  (COZAAR ) 25 MG tablet Take 0.5 tablets (12.5 mg total) by mouth daily. 45 tablet 3   magnesium  oxide (MAG-OX) 400 MG tablet Take 1 tablet (400 mg total) by mouth 2 (two) times daily. 30 tablet 11   metoprolol  succinate (TOPROL -XL) 50 MG 24 hr tablet TAKE 1 TABLET BY MOUTH IN THE MORNING AND 1 AT BEDTIME (TAKE  WITH  OR  FOLLOWING  A  MEAL) 180 tablet 3   Multiple Vitamin (MULTIVITAMIN WITH MINERALS) TABS tablet Take 1 tablet by mouth daily.      potassium chloride  SA (KLOR-CON  M20) 20 MEQ  tablet Take 1 tablet (20 mEq total) by mouth as directed. 1 tablet daily 20 mEq by mouth as directed per alleviate research protocol. 90 tablet 3   pravastatin  (PRAVACHOL ) 40 MG tablet Take 1 tablet by mouth once daily 90 tablet 3   Semaglutide ,0.25 or 0.5MG /DOS, 2 MG/3ML SOPN Inject 0.5 mg into the skin once a week.     tamsulosin  (FLOMAX ) 0.4 MG CAPS capsule Take 1 capsule by mouth once daily 30 capsule 0   temazepam  (RESTORIL ) 30 MG capsule Take 1 capsule (30 mg total) by mouth at bedtime. 30 capsule 1   tiZANidine  (ZANAFLEX ) 2 MG tablet Take 0.5-1 tablets (1-2 mg total) by mouth at bedtime as needed for muscle spasms. 30 tablet 1   VITAMIN D PO Take by mouth.     No current facility-administered medications on file prior to visit.   Allergies  Allergen Reactions   Sulfonamide Derivatives Other (See Comments)    Flu like symptoms    Zolpidem  Other (See Comments)    Excessive, prolonged sedation   Family History  Problem Relation Age of Onset   Clotting disorder Mother    Heart disease Mother        s/p MI   Heart attack Mother    Hypertension Mother    Diabetes Mother    Hyperlipidemia Mother  Stroke Mother    Cancer Father        ? lung   Lung disease Father        smoker   Diabetes Sister    Hypertension Sister        smoker   Aneurysm Sister        brain   Other Sister        clipped   Seizures Sister        d/o w/aneurysm/ smoker   Leukemia Maternal Grandmother        ?   Colon cancer Neg Hx    Colon polyps Neg Hx    Stomach cancer Neg Hx    Rectal cancer Neg Hx    Esophageal cancer Neg Hx    PE: BP 120/70   Pulse 77   Ht 5\' 11"  (1.803 m)   Wt 194 lb 3.2 oz (88.1 kg)   SpO2 97%   BMI 27.09 kg/m  Wt Readings from Last 3 Encounters:  11/11/23 194 lb 3.2 oz (88.1 kg)  11/07/23 194 lb 6.4 oz (88.2 kg)  10/13/23 192 lb 3.2 oz (87.2 kg)   Constitutional: overweight, in NAD Eyes:  EOMI, no exophthalmos ENT: no neck masses, no cervical  lymphadenopathy Cardiovascular: RRR, No MRG Respiratory: CTA B Musculoskeletal: no deformities Skin:no rashes Neurological: no tremor with outstretched hands  ASSESSMENT: 1. DM2, non-insulin -dependent, uncontrolled, with long-term complications - s and d CHF - A fib - s/p cardioversion - 05/2019 >> went into cardiac shock - CKD - sees nephrology - DR - ED  2.  Overweight  3. HL  4.  Peripheral neuropathy  PLAN:  1. Patient with longstanding, previously uncontrolled type 2 diabetes, with significant improvement in blood sugars on the regimen containing SGLT2 inhibitor and weekly GLP-1 receptor agonist.  He obtains Farxiga  and Ozempic  through the patient assistance program.  He also continues to stay active and is mostly on a whole food plant-based diet with intermittent fasting.  At last visit HbA1c was excellent, at 5.7%, improved.  He had another HbA1c that was higher, at 6.5% 3 days ago, but this is still at goal. -At today's visit, the vast majority of his blood sugars are at goal, with only occasional very slightly elevated blood sugars in the morning, in the 130s.  No need to change his regimen for now. - I suggested to:  Patient Instructions  Please continue: - Farxiga  10 mg daily - Ozempic  0.5 mg weekly  Please come back for a follow-up appointment in 6 months.  - advised to check sugars at different times of the day - 1x a day, rotating check times - advised for yearly eye exams >> he is UTD - return to clinic in 6 months  2.  Overweight -continue SGLT 2 inhibitor and GLP-1 receptor agonist which should also help with weight loss - He gained 6 pounds before last visit and gained 3 pounds since then  3. HL - Latest lipid panel was reviewed from few days ago: LDL above target of less than 55, HDL low, triglycerides at goal Lab Results  Component Value Date   CHOL 156 11/08/2023   HDL 37.00 (L) 11/08/2023   LDLCALC 91 11/08/2023   LDLDIRECT 84.0 05/02/2018   TRIG  137.0 11/08/2023   CHOLHDL 4 11/08/2023  - He continues on pravastatin  40 mg daily and omega-3 fatty acids -no side effects  4.  Peripheral neuropathy - Most likely related to diabetes - On gabapentin  100 to  200 mg at bedtime, initially added by Dr. Alvis Ba - He continues to have numbness and tingling - He follows up with podiatry-last exam 08/2023. - He uses diabetic shoes  Emilie Harden, MD PhD [facility] Endocrinology

## 2023-11-11 NOTE — Patient Instructions (Signed)
Please continue: - Farxiga 10 mg daily - Ozempic 0.5 mg weekly  Please come back for a follow-up appointment in 6 months.

## 2023-11-16 DIAGNOSIS — E349 Endocrine disorder, unspecified: Secondary | ICD-10-CM | POA: Diagnosis not present

## 2023-11-23 DIAGNOSIS — R972 Elevated prostate specific antigen [PSA]: Secondary | ICD-10-CM | POA: Diagnosis not present

## 2023-11-23 DIAGNOSIS — R351 Nocturia: Secondary | ICD-10-CM | POA: Diagnosis not present

## 2023-11-23 DIAGNOSIS — E291 Testicular hypofunction: Secondary | ICD-10-CM | POA: Diagnosis not present

## 2023-11-23 DIAGNOSIS — N403 Nodular prostate with lower urinary tract symptoms: Secondary | ICD-10-CM | POA: Diagnosis not present

## 2023-11-29 ENCOUNTER — Ambulatory Visit: Payer: Medicare HMO | Admitting: Podiatry

## 2023-11-29 ENCOUNTER — Encounter: Payer: Self-pay | Admitting: Podiatry

## 2023-11-29 DIAGNOSIS — E1142 Type 2 diabetes mellitus with diabetic polyneuropathy: Secondary | ICD-10-CM | POA: Diagnosis not present

## 2023-11-29 DIAGNOSIS — M79674 Pain in right toe(s): Secondary | ICD-10-CM

## 2023-11-29 DIAGNOSIS — B351 Tinea unguium: Secondary | ICD-10-CM | POA: Diagnosis not present

## 2023-11-29 DIAGNOSIS — M79675 Pain in left toe(s): Secondary | ICD-10-CM

## 2023-12-02 DIAGNOSIS — R188 Other ascites: Secondary | ICD-10-CM | POA: Diagnosis not present

## 2023-12-03 ENCOUNTER — Encounter: Payer: Self-pay | Admitting: Podiatry

## 2023-12-03 NOTE — Progress Notes (Signed)
  Subjective:  Patient ID: [name], male    DOB: February 20, 1951,  MRN: [id number]  [name] presents to clinic today for at risk foot care with history of diabetic neuropathy and painful thick toenails that are difficult to trim. Pain interferes with ambulation. Aggravating factors include wearing enclosed shoe gear. Pain is relieved with periodic professional debridement.  Chief Complaint  Patient presents with   Diabetes    Just a routine clipping.  Saw Dr. Trixie - 11/15/2023; A1c - 6.5   New problem(s): None.   PCP is Domenica Harlene LABOR, MD.  Allergies  Allergen Reactions   Sulfonamide Derivatives Other (See Comments)    Flu like symptoms    Zolpidem  Other (See Comments)    Excessive, prolonged sedation   Review of Systems: Negative except as noted in the HPI.  Objective: No changes noted in today's physical examination. There were no vitals filed for this visit. [name] is a pleasant 73 y.o. male WD, WN in NAD. AAO x 3.  Vascular Examination: Capillary refill time immediate b/l. Palpable pedal pulses. Pedal hair present b/l. No pain with calf compression b/l. Skin temperature gradient WNL b/l. No cyanosis or clubbing b/l. No ischemia or gangrene noted b/l.   Neurological Examination: Sensation grossly intact b/l with 10 gram monofilament. Vibratory sensation intact b/l. Pt has subjective symptoms of neuropathy.  Dermatological Examination: Pedal skin with normal turgor, texture and tone b/l.  No open wounds. No interdigital macerations.   Toenails 1-5 b/l thick, discolored, elongated with subungual debris and pain on dorsal palpation.   No corns, calluses nor porokeratotic lesions noted.  Musculoskeletal Examination: Muscle strength 5/5 to all lower extremity muscle groups bilaterally. Hammertoe(s) 2-5 bilaterally.. No pain, crepitus or joint limitation noted with ROM b/l LE.  Patient ambulates independently without assistive aids.  Radiographs:  None  Last A1c:      Latest Ref Rng & Units 11/08/2023   10:30 AM 05/10/2023   10:16 AM 01/31/2023   10:48 AM 01/20/2023   12:19 PM  Hemoglobin A1C  Hemoglobin-A1c 4.6 - 6.5 % 6.5  5.7  6.5  6.5    Assessment/Plan: 1. Pain due to onychomycosis of toenails of both feet   2. Diabetic peripheral neuropathy associated with type 2 diabetes mellitus [facility])   Consent given for treatment. Patient examined. All patient's and/or POA's questions/concerns addressed on today's visit. Mycotic toenails 1-5 debrided in length and girth without incident. Continue foot and shoe inspections daily. Monitor blood glucose per PCP/Endocrinologist's recommendations.Continue soft, supportive shoe gear daily. Report any pedal injuries to medical professional. Call office if there are any quesitons/concerns. -Patient/POA to call should there be question/concern in the interim.   Return in about 3 months (around 02/29/2024).  [name], DPM      [place] LOCATION: [address], [place] [address]                   Office [phone]   [facility] LOCATION: [address] [place], [place] [address] Office [phone]

## 2023-12-12 ENCOUNTER — Other Ambulatory Visit: Payer: Self-pay | Admitting: Family Medicine

## 2023-12-12 ENCOUNTER — Other Ambulatory Visit: Payer: Self-pay | Admitting: Internal Medicine

## 2023-12-12 DIAGNOSIS — I480 Paroxysmal atrial fibrillation: Secondary | ICD-10-CM

## 2023-12-13 ENCOUNTER — Other Ambulatory Visit: Payer: Self-pay | Admitting: Family Medicine

## 2023-12-13 MED ORDER — TEMAZEPAM 30 MG PO CAPS: 30.0000 mg | ORAL_CAPSULE | Freq: Every day | ORAL | 1 refills | Status: DC

## 2023-12-13 MED ORDER — TEMAZEPAM 30 MG PO CAPS: 30.0000 mg | ORAL_CAPSULE | Freq: Every day | ORAL | 0 refills | Status: DC

## 2023-12-13 NOTE — Addendum Note (Signed)
 Addended by: ESTELLE GILLIS D on: 12/13/2023 12:44 PM   Modules accepted: Orders

## 2023-12-13 NOTE — Telephone Encounter (Signed)
 Temazepam  was actually printed today by accident.  Requesting: temazepam  Contract: No UDS: 09/21/2022 Last Visit: 11/07/2023 Next Visit: 02/16/2024 Last Refill: 10/12/23  Please Advise

## 2023-12-16 ENCOUNTER — Other Ambulatory Visit: Payer: Self-pay | Admitting: Family Medicine

## 2023-12-18 ENCOUNTER — Other Ambulatory Visit: Payer: Self-pay | Admitting: Cardiovascular Disease

## 2023-12-18 ENCOUNTER — Other Ambulatory Visit: Payer: Self-pay | Admitting: Family Medicine

## 2023-12-18 ENCOUNTER — Other Ambulatory Visit (HOSPITAL_COMMUNITY): Payer: Self-pay | Admitting: Family Medicine

## 2023-12-18 DIAGNOSIS — Z006 Encounter for examination for normal comparison and control in clinical research program: Secondary | ICD-10-CM

## 2024-01-02 DIAGNOSIS — M1000 Idiopathic gout, unspecified site: Secondary | ICD-10-CM | POA: Diagnosis not present

## 2024-01-02 DIAGNOSIS — E1122 Type 2 diabetes mellitus with diabetic chronic kidney disease: Secondary | ICD-10-CM | POA: Diagnosis not present

## 2024-01-02 DIAGNOSIS — I10 Essential (primary) hypertension: Secondary | ICD-10-CM | POA: Diagnosis not present

## 2024-01-02 DIAGNOSIS — I5042 Chronic combined systolic (congestive) and diastolic (congestive) heart failure: Secondary | ICD-10-CM | POA: Diagnosis not present

## 2024-01-02 DIAGNOSIS — I129 Hypertensive chronic kidney disease with stage 1 through stage 4 chronic kidney disease, or unspecified chronic kidney disease: Secondary | ICD-10-CM | POA: Diagnosis not present

## 2024-01-02 DIAGNOSIS — B181 Chronic viral hepatitis B without delta-agent: Secondary | ICD-10-CM | POA: Diagnosis not present

## 2024-01-02 DIAGNOSIS — N1832 Chronic kidney disease, stage 3b: Secondary | ICD-10-CM | POA: Diagnosis not present

## 2024-01-02 DIAGNOSIS — N2581 Secondary hyperparathyroidism of renal origin: Secondary | ICD-10-CM | POA: Diagnosis not present

## 2024-01-02 DIAGNOSIS — E876 Hypokalemia: Secondary | ICD-10-CM | POA: Diagnosis not present

## 2024-01-04 DIAGNOSIS — E1122 Type 2 diabetes mellitus with diabetic chronic kidney disease: Secondary | ICD-10-CM | POA: Diagnosis not present

## 2024-01-04 DIAGNOSIS — N2581 Secondary hyperparathyroidism of renal origin: Secondary | ICD-10-CM | POA: Diagnosis not present

## 2024-01-04 DIAGNOSIS — E876 Hypokalemia: Secondary | ICD-10-CM | POA: Diagnosis not present

## 2024-01-04 DIAGNOSIS — N1832 Chronic kidney disease, stage 3b: Secondary | ICD-10-CM | POA: Diagnosis not present

## 2024-01-04 DIAGNOSIS — I129 Hypertensive chronic kidney disease with stage 1 through stage 4 chronic kidney disease, or unspecified chronic kidney disease: Secondary | ICD-10-CM | POA: Diagnosis not present

## 2024-01-31 ENCOUNTER — Telehealth: Payer: Self-pay | Admitting: Cardiovascular Disease

## 2024-01-31 ENCOUNTER — Other Ambulatory Visit (HOSPITAL_COMMUNITY): Payer: Self-pay | Admitting: Internal Medicine

## 2024-01-31 ENCOUNTER — Telehealth: Payer: Self-pay

## 2024-01-31 NOTE — Telephone Encounter (Signed)
 Pt c/o medication issue:  1. Name of Medication: dapagliflozin  propanediol (FARXIGA ) 10 MG TABS tablet   2. How are you currently taking this medication (dosage and times per day)? As written  3. Are you having a reaction (difficulty breathing--STAT)? No  4. What is your medication issue? Pt would like to know if he should apply for pt assistance again for this medication since his one year is up

## 2024-01-31 NOTE — Telephone Encounter (Signed)
 Patient Assistance  Medication: Ozempic Dosage:0.25/0.5 mg Quantity:4 boxes   [employer]

## 2024-02-01 ENCOUNTER — Other Ambulatory Visit (HOSPITAL_COMMUNITY): Payer: Self-pay

## 2024-02-01 ENCOUNTER — Telehealth: Payer: Self-pay

## 2024-02-01 MED ORDER — DAPAGLIFLOZIN PROPANEDIOL 10 MG PO TABS: 10.0000 mg | ORAL_TABLET | Freq: Every day | ORAL | 3 refills | Status: AC

## 2024-02-01 NOTE — Telephone Encounter (Signed)
 Rx for Farxiga  was sent to [facility] with grant billing information included in the pharmacy comment of the order.

## 2024-02-01 NOTE — Telephone Encounter (Addendum)
 Patient Advocate Encounter   The patient was approved for a [employer] grant that will help cover the cost of FARXIGA  Total amount awarded, $4,500.  Effective: 01/02/24 - 12/31/24   APW:[id number] ERW:[id number] Hmnle:[id number] [id number]   Please send in prescription for FARXIGA  to [facility] including grant billing information in RX comment ([id number], ERW:[id number], Group: [id number], PI:[id number])   Ileana Lehmann, CPhT  Pharmacy Patient Advocate Specialist  Direct Number: [phone] Fax: [phone]

## 2024-02-08 ENCOUNTER — Other Ambulatory Visit (HOSPITAL_COMMUNITY): Payer: Self-pay | Admitting: Family Medicine

## 2024-02-13 ENCOUNTER — Other Ambulatory Visit: Payer: Self-pay | Admitting: Family Medicine

## 2024-02-13 NOTE — Assessment & Plan Note (Signed)
Rate controlled and tolerating current meds, follows with cardiology

## 2024-02-13 NOTE — Telephone Encounter (Signed)
 Requesting: temazepam  30mg   Contract:09/21/22 UDS:09/21/22 Last Visit: 11/07/23 Next Visit:02/16/24 Last Refill: 12/13/23 #30 and 1RF   Please Advise

## 2024-02-13 NOTE — Assessment & Plan Note (Signed)
 Hydrate and monitor

## 2024-02-13 NOTE — Assessment & Plan Note (Signed)
 Well controlled, no changes to meds. Encouraged heart healthy diet such as the DASH diet and exercise as tolerated.

## 2024-02-13 NOTE — Assessment & Plan Note (Signed)
Encourage heart healthy diet such as MIND or DASH diet, increase exercise, avoid trans fats, simple carbohydrates and processed foods, consider a krill or fish or flaxseed oil cap daily. Pravastatin tolerating

## 2024-02-13 NOTE — Assessment & Plan Note (Signed)
Patient encouraged to maintain heart healthy diet, regular exercise, adequate sleep. Consider daily probiotics. Take medications as prescribed. Labs ordered and reviewed. Given and reviewed copy of ACP documents from [facility] Secretary of State and encouraged to complete and return  Colonoscopy 2023 repeat in 2028

## 2024-02-16 ENCOUNTER — Encounter: Payer: Self-pay | Admitting: Family Medicine

## 2024-02-16 ENCOUNTER — Ambulatory Visit (INDEPENDENT_AMBULATORY_CARE_PROVIDER_SITE_OTHER): Payer: Medicare HMO | Admitting: Family Medicine

## 2024-02-16 VITALS — BP 122/74 | HR 71 | Resp 16 | Ht 71.0 in | Wt 194.2 lb

## 2024-02-16 DIAGNOSIS — Z0000 Encounter for general adult medical examination without abnormal findings: Secondary | ICD-10-CM | POA: Diagnosis not present

## 2024-02-16 DIAGNOSIS — Z23 Encounter for immunization: Secondary | ICD-10-CM | POA: Diagnosis not present

## 2024-02-16 DIAGNOSIS — N1832 Chronic kidney disease, stage 3b: Secondary | ICD-10-CM

## 2024-02-16 DIAGNOSIS — I10 Essential (primary) hypertension: Secondary | ICD-10-CM | POA: Diagnosis not present

## 2024-02-16 DIAGNOSIS — Z79899 Other long term (current) drug therapy: Secondary | ICD-10-CM | POA: Diagnosis not present

## 2024-02-16 DIAGNOSIS — E782 Mixed hyperlipidemia: Secondary | ICD-10-CM | POA: Diagnosis not present

## 2024-02-16 DIAGNOSIS — I4891 Unspecified atrial fibrillation: Secondary | ICD-10-CM | POA: Diagnosis not present

## 2024-02-16 DIAGNOSIS — E1122 Type 2 diabetes mellitus with diabetic chronic kidney disease: Secondary | ICD-10-CM

## 2024-02-16 DIAGNOSIS — N2581 Secondary hyperparathyroidism of renal origin: Secondary | ICD-10-CM | POA: Diagnosis not present

## 2024-02-16 NOTE — Patient Instructions (Addendum)
 Tetanus shot at pharmacy RSV, Respiratory Syncitial Virus Vaccine, Arexvy COVID and flu booster annually   Preventive Care 65 Years and Older, Male Preventive care refers to lifestyle choices and visits with your health care provider that can promote health and wellness. Preventive care visits are also called wellness exams. What can I expect for my preventive care visit? Counseling During your preventive care visit, your health care provider may ask about your: Medical history, including: Past medical problems. Family medical history. History of falls. Current health, including: Emotional well-being. Home life and relationship well-being. Sexual activity. Memory and ability to understand (cognition). Lifestyle, including: Alcohol , nicotine or tobacco, and drug use. Access to firearms. Diet, exercise, and sleep habits. Work and work Astronomer. Sunscreen use. Safety issues such as seatbelt and bike helmet use. Physical exam Your health care provider will check your: Height and weight. These may be used to calculate your BMI (body mass index). BMI is a measurement that tells if you are at a healthy weight. Waist circumference. This measures the distance around your waistline. This measurement also tells if you are at a healthy weight and may help predict your risk of certain diseases, such as type 2 diabetes and high blood pressure. Heart rate and blood pressure. Body temperature. Skin for abnormal spots. What immunizations do I need?  Vaccines are usually given at various ages, according to a schedule. Your health care provider will recommend vaccines for you based on your age, medical history, and lifestyle or other factors, such as travel or where you work. What tests do I need? Screening Your health care provider may recommend screening tests for certain conditions. This may include: Lipid and cholesterol levels. Diabetes screening. This is done by checking your blood  sugar (glucose) after you have not eaten for a while (fasting). Hepatitis C test. Hepatitis B test. HIV (human immunodeficiency virus) test. STI (sexually transmitted infection) testing, if you are at risk. Lung cancer screening. Colorectal cancer screening. Prostate cancer screening. Abdominal aortic aneurysm (AAA) screening. You may need this if you are a current or former smoker. Talk with your health care provider about your test results, treatment options, and if necessary, the need for more tests. Follow these instructions at home: Eating and drinking  Eat a diet that includes fresh fruits and vegetables, whole grains, lean protein, and low-fat dairy products. Limit your intake of foods with high amounts of sugar, saturated fats, and salt. Take vitamin and mineral supplements as recommended by your health care provider. Do not drink alcohol  if your health care provider tells you not to drink. If you drink alcohol : Limit how much you have to 0-2 drinks a day. Know how much alcohol  is in your drink. In the [place] one drink equals one 12 oz bottle of beer (355 mL), one 5 oz glass of wine (148 mL), or one 1 oz glass of hard liquor (44 mL). Lifestyle Brush your teeth every morning and night with fluoride toothpaste. Floss one time each day. Exercise for at least 30 minutes 5 or more days each week. Do not use any products that contain nicotine or tobacco. These products include cigarettes, chewing tobacco, and vaping devices, such as e-cigarettes. If you need help quitting, ask your health care provider. Do not use drugs. If you are sexually active, practice safe sex. Use a condom or other form of protection to prevent STIs. Take aspirin  only as told by your health care provider. Make sure that you understand how much to take and  what form to take. Work with your health care provider to find out whether it is safe and beneficial for you to take aspirin  daily. Ask your health care provider  if you need to take a cholesterol-lowering medicine (statin). Find healthy ways to manage stress, such as: Meditation, yoga, or listening to music. Journaling. Talking to a trusted person. Spending time with friends and family. Safety Always wear your seat belt while driving or riding in a vehicle. Do not drive: If you have been drinking alcohol . Do not ride with someone who has been drinking. When you are tired or distracted. While texting. If you have been using any mind-altering substances or drugs. Wear a helmet and other protective equipment during sports activities. If you have firearms in your house, make sure you follow all gun safety procedures. Minimize exposure to UV radiation to reduce your risk of skin cancer. What's next? Visit your health care provider once a year for an annual wellness visit. Ask your health care provider how often you should have your eyes and teeth checked. Stay up to date on all vaccines. This information is not intended to replace advice given to you by your health care provider. Make sure you discuss any questions you have with your health care provider. Document Revised: 11/19/2020 Document Reviewed: 11/19/2020 Elsevier Patient Education  2024 [employer].

## 2024-02-16 NOTE — Progress Notes (Signed)
 Subjective:    Patient ID: [name], male    DOB: [birth year], 73 y.o.   MRN: [id number]  Chief Complaint  Patient presents with   Annual Exam    Patient presents today for physical exam.   Quality Metric Gaps    AWV, TDAP, zoster vaccines.    HPI Discussed the use of AI scribe software for clinical note transcription with the patient, who gave verbal consent to proceed.  History of Present Illness [name] is a 73 year old male who presents for a routine follow-up and medication review.  He is here for his annual review of Ativan , which he takes as a controlled medication.  He has a history of fluctuating PSA levels, with recent values dropping to 11.2 and then rising to 13. He is under urology care and has a follow-up scheduled for the 25th of this month. His PSA levels have varied over the years, previously reaching 19 and then dropping to 16.25. He has undergone prostate scans but no biopsies. No new urinary symptoms, including burning or hematuria, are reported.  He maintains a balanced diet, exercises regularly, and monitors his weight as preventive measures. He drinks plenty of water and avoids coffee, preferring green tea with honey.  He discusses his vaccination history, noting he has received the shingles shot and is due for a tetanus booster. He is considering the RSV vaccine along with the flu and COVID vaccines.  He has a family history that is stable with no new changes reported. He is unsure if he has advanced directives and a living will, but recalls discussing them in previous visits.    Past Medical History:  Diagnosis Date   AORTIC STENOSIS, MODERATE 12/05/2009   Arthritis    Atrial fibrillation (HCC)    Atrial fibrillation with RVR (HCC) 07/05/2017   Atrial flutter (HCC) 12/05/2009   Atypical chest pain 11/08/2011   BENIGN PROSTATIC HYPERTROPHY, HX OF 12/05/2009   CHEST PAIN-UNSPECIFIED 11/11/2009   CHF 12/05/2009   Chronic renal  insufficiency 12/16/2016   Debility 04/18/2012   Diarrhea 12/16/2016   DYSPNEA 12/19/2009   Edema 04/18/2012   ERECTILE DYSFUNCTION 01/25/2007   FATIGUE, CHRONIC 11/11/2009   Gout 07/10/2012   Heart murmur    History of kidney stones    they passed (07/05/2017)   Hyperkalemia 04/18/2012   Hyperlipidemia    Hypertension    HYPERTENSION 01/25/2007   Hypoxia 05/05/2011   Insomnia 05/13/2016   INSOMNIA, HX OF 01/25/2007   Knee pain, bilateral 12/28/2010   Knee pain, right 12/28/2010   NEUROMA 01/05/2010   OBESITY NOS 01/25/2007   OSA on CPAP 04/06/2010   PERIPHERAL NEUROPATHY 01/05/2010   PULMONARY FUNCTION TESTS, ABNORMAL 02/02/2010   Sleep apnea    wears CPAP   Superficial thrombophlebitis of left leg 09/07/2011   TESTICULAR HYPOFUNCTION 01/05/2010   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   Toe pain, right 07/10/2012   Type II diabetes mellitus (HCC) 12/05/2009   UNSPECIFIED ANEMIA 01/05/2010    Past Surgical History:  Procedure Laterality Date   A-FLUTTER ABLATION N/A 05/16/2019   Procedure: A-FLUTTER ABLATION;  Surgeon: Inocencio Soyla Lunger, MD;  Location: [facility] INVASIVE CV LAB;  Service: Cardiovascular;  Laterality: N/A;   ATRIAL FIBRILLATION ABLATION N/A 05/16/2019   Procedure: ATRIAL FIBRILLATION ABLATION;  Surgeon: Inocencio Soyla Lunger, MD;  Location: [facility] INVASIVE CV [facility];  Service: Cardiovascular;  Laterality: N/A;   ATRIAL FIBRILLATION ABLATION N/A 03/10/2020   Procedure: ATRIAL FIBRILLATION ABLATION;  Surgeon: Inocencio, Will  Gladis, MD;  Location: [facility] INVASIVE CV LAB;  Service: Cardiovascular;  Laterality: N/A;   CARDIAC CATHETERIZATION  10/16/2009   nonischemic cardiomyopathy   CARDIOVERSION N/A 03/25/2015   Procedure: CARDIOVERSION;  Surgeon: Vinie JAYSON Maxcy, MD;  Location: [facility] ENDOSCOPY;  Service: Cardiovascular;  Laterality: N/A;   CARDIOVERSION N/A 06/17/2017   Procedure: CARDIOVERSION;  Surgeon: Francyne Headland, MD;  Location: [facility] ENDOSCOPY;  Service: Cardiovascular;   Laterality: N/A;   CARDIOVERSION N/A 07/08/2017   Procedure: CARDIOVERSION;  Surgeon: Okey Vina GAILS, MD;  Location: [facility] ENDOSCOPY;  Service: Cardiovascular;  Laterality: N/A;   CARDIOVERSION N/A 02/27/2019   Procedure: CARDIOVERSION;  Surgeon: Francyne Headland, MD;  Location: [facility] ENDOSCOPY;  Service: Cardiovascular;  Laterality: N/A;   CARDIOVERSION N/A 08/16/2019   Procedure: CARDIOVERSION;  Surgeon: Francyne Headland, MD;  Location: [facility] ENDOSCOPY;  Service: Cardiovascular;  Laterality: N/A;   COLONOSCOPY  03/10/2021   1st colonoscopy , [facility], large transverse polyp, clip and spot applied   METATARSAL OSTEOTOMY Bilateral ~ 1980   removed part of 5th metatarsal to corect curvature of toes    RIGHT HEART CATH N/A 05/16/2019   Procedure: RIGHT HEART CATH;  Surgeon: Cherrie Toribio SAUNDERS, MD;  Location: [facility] INVASIVE CV LAB;  Service: Cardiovascular;  Laterality: N/A;   TEE WITHOUT CARDIOVERSION  05/16/2019   Procedure: Transesophageal Echocardiogram (Tee);  Surgeon: Inocencio Soyla Gladis, MD;  Location: [facility] INVASIVE CV LAB;  Service: Cardiovascular;;   TEE WITHOUT CARDIOVERSION N/A 03/07/2020   Procedure: TRANSESOPHAGEAL ECHOCARDIOGRAM (TEE);  Surgeon: Hobart Powell BRAVO, MD;  Location: [facility] ENDOSCOPY;  Service: Cardiovascular;  Laterality: N/A;    Family History  Problem Relation Age of Onset   Clotting disorder Mother    Heart disease Mother        s/p MI   Heart attack Mother    Hypertension Mother    Diabetes Mother    Hyperlipidemia Mother    Stroke Mother    Cancer Father        ? lung   Lung disease Father        smoker   Diabetes Sister    Hypertension Sister        smoker   Aneurysm Sister        brain   Other Sister        clipped   Seizures Sister        d/o w/aneurysm/ smoker   Leukemia Maternal Grandmother        ?   Colon cancer Neg Hx    Colon polyps Neg Hx    Stomach cancer Neg Hx    Rectal cancer Neg Hx    Esophageal cancer Neg Hx     Social History    Socioeconomic History   Marital status: Married    Spouse name: Not on file   Number of children: 4   Years of education: 12   Highest education level: High school graduate  Occupational History   Occupation: retired    Associate Professor: [employer]  Tobacco Use   Smoking status: Former    Current packs/day: 0.00    Average packs/day: 1 pack/day for 10.0 years (10.0 ttl pk-yrs)    Types: Cigarettes    Start date: 06/07/1970    Quit date: 06/07/1980    Years since quitting: 43.7   Smokeless tobacco: Never  Vaping Use   Vaping status: Never Used  Substance and Sexual Activity   Alcohol  use: No   Drug use: No   Sexual  activity: Not Currently  Other Topics Concern   Not on file  Social History Narrative   Lives with male partner in a one story home.  His son lives there off and on.  Retired from [employer].  Education: high school.  Right handed   Social Drivers of Health   Financial Resource Strain: Not on file  Food Insecurity: Not on file  Transportation Needs: Not on file  Physical Activity: Not on file  Stress: Stress Concern Present (04/05/2022)   [name] [employer] - Occupational Stress Questionnaire    Feeling of Stress : To some extent  Social Connections: Not on file  Intimate Partner Violence: Not on file    Outpatient Medications Prior to Visit  Medication Sig Dispense Refill   Accu-Chek Softclix Lancets lancets Use as instructed 200 each 3   albuterol  (VENTOLIN  HFA) 108 (90 Base) MCG/ACT inhaler Inhale 2 puffs into the lungs every 6 (six) hours as needed for wheezing or shortness of breath. 1 Inhaler 6   Alcohol  Swabs  (DROPSAFE ALCOHOL  PREP) 70 % PADS USE AS DIRECTED 300 each 3   allopurinol  (ZYLOPRIM ) 100 MG tablet Take 1 tablet by mouth once daily 90 tablet 0   apixaban  (ELIQUIS ) 5 MG TABS tablet Take 1 tablet by mouth twice daily 60 tablet 11   Blood Glucose Monitoring Suppl (ACCU-CHEK GUIDE ME) w/Device KIT USE TO TEST BLOOD SUGAR ONCE TO  TWICE DAILY 1 kit 0   calcium  carbonate (TUMS - DOSED IN MG ELEMENTAL CALCIUM ) 500 MG chewable tablet Chew 500 mg by mouth daily as needed for indigestion or heartburn.     cetirizine  (ZYRTEC ) 10 MG tablet Take 1 tablet (10 mg total) by mouth daily. 30 tablet 11   clobetasol  ointment (TEMOVATE ) 0.05 % 1 application to affected area     Clobetasol  Prop Emollient Base (CLOBETASOL  PROPIONATE E) 0.05 % emollient cream Apply 1 application topically 2 (two) times daily as needed (insect bite). 60 g 1   colchicine  (COLCRYS ) 0.6 MG tablet Take one tablet for 4-6 days as needed for gout flare up. 15 tablet 3   dapagliflozin  propanediol (FARXIGA ) 10 MG TABS tablet Take 1 tablet (10 mg total) by mouth daily. 90 tablet 3   fish oil-omega-3 fatty acids  1000 MG capsule Take 1 g by mouth daily.     furosemide  (LASIX ) 80 MG tablet TAKE 1 TABLET BY MOUTH ONCE DAILY AS NEEDED. MAY TAKE AN ADDITIONAL 40MG  (1/2 TABLET) AS DIRECTED PER ALLEVIATE RESEARCH STUDY PRN PLAN. 135 tablet 3   gabapentin  (NEURONTIN ) 100 MG capsule Take 2 capsules (200 mg total) by mouth at bedtime. 180 capsule 3   glucose blood (ACCU-CHEK GUIDE TEST) test strip USE TO TEST BLOOD SUGAR ONCE TO TWICE DAILY 200 each 3   hydrALAZINE  (APRESOLINE ) 10 MG tablet TAKE 1 TABLET BY MOUTH THREE TIMES DAILY 270 tablet 0   HYDROcodone -acetaminophen  (NORCO) 10-325 MG tablet Take 1 tablet by mouth every 8 (eight) hours as needed for moderate pain or severe pain. 90 tablet 0   hydrocortisone  2.5 % ointment Apply topically 2 (two) times daily as needed. 30 g 1   isosorbide  mononitrate (IMDUR ) 30 MG 24 hr tablet Take 1 tablet by mouth once daily 90 tablet 0   LORazepam  (ATIVAN ) 1 MG tablet Take 1 tablet (1 mg total) by mouth every 8 (eight) hours as needed. for anxiety 45 tablet 3   losartan  (COZAAR ) 25 MG tablet Take 1/2 (one-half) tablet by mouth once daily 45 tablet  0   magnesium  oxide (MAG-OX) 400 MG tablet Take 1 tablet (400 mg total) by mouth 2 (two) times  daily. 30 tablet 11   metoprolol  succinate (TOPROL -XL) 50 MG 24 hr tablet TAKE 1 TABLET BY MOUTH IN THE MORNING AND 1 AT BEDTIME (TAKE  WITH  OR  FOLLOWING  A  MEAL) 180 tablet 3   Multiple Vitamin (MULTIVITAMIN WITH MINERALS) TABS tablet Take 1 tablet by mouth daily.      potassium chloride  SA (KLOR-CON  M20) 20 MEQ tablet TAKE 1 TABLET BY MOUTH ONCE DAILY AS DIRECTED PER  ALLEVIATE  RESEARCH  PROTOCOL 90 tablet 0   pravastatin  (PRAVACHOL ) 40 MG tablet Take 1 tablet by mouth once daily 90 tablet 3   Semaglutide ,0.25 or 0.5MG /DOS, 2 MG/3ML SOPN Inject 0.5 mg into the skin once a week.     tamsulosin  (FLOMAX ) 0.4 MG CAPS capsule Take 1 capsule by mouth once daily 30 capsule 0   temazepam  (RESTORIL ) 30 MG capsule Take 1 capsule by mouth at bedtime 30 capsule 0   tiZANidine  (ZANAFLEX ) 2 MG tablet Take 0.5-1 tablets (1-2 mg total) by mouth at bedtime as needed for muscle spasms. 30 tablet 1   VITAMIN D PO Take by mouth.     No facility-administered medications prior to visit.    Allergies  Allergen Reactions   Sulfonamide Derivatives Other (See Comments)    Flu like symptoms    Zolpidem  Other (See Comments)    Excessive, prolonged sedation    Review of Systems  Constitutional:  Negative for chills, fever and malaise/fatigue.  HENT:  Negative for congestion and hearing loss.   Eyes:  Negative for discharge.  Respiratory:  Negative for cough, sputum production and shortness of breath.   Cardiovascular:  Negative for chest pain, palpitations and leg swelling.  Gastrointestinal:  Negative for abdominal pain, blood in stool, constipation, diarrhea, heartburn, nausea and vomiting.  Genitourinary:  Negative for dysuria, frequency, hematuria and urgency.  Musculoskeletal:  Negative for back pain, falls and myalgias.  Skin:  Negative for rash.  Neurological:  Negative for dizziness, sensory change, loss of consciousness, weakness and headaches.  Endo/Heme/Allergies:  Negative for environmental  allergies. Does not bruise/bleed easily.  Psychiatric/Behavioral:  Negative for depression and suicidal ideas. The patient is nervous/anxious and has insomnia.        Objective:    Physical Exam Vitals reviewed.  Constitutional:      General: He is not in acute distress.    Appearance: Normal appearance. He is not ill-appearing or diaphoretic.  HENT:     Head: Normocephalic and atraumatic.     Right Ear: Tympanic membrane, ear canal and external ear normal. There is no impacted cerumen.     Left Ear: Tympanic membrane, ear canal and external ear normal. There is no impacted cerumen.     Nose: Nose normal. No rhinorrhea.     Mouth/Throat:     Pharynx: Oropharynx is clear.  Eyes:     General: No scleral icterus.    Extraocular Movements: Extraocular movements intact.     Conjunctiva/sclera: Conjunctivae normal.     Pupils: Pupils are equal, round, and reactive to light.  Neck:     Thyroid : No thyroid  mass or thyroid  tenderness.  Cardiovascular:     Rate and Rhythm: Normal rate and regular rhythm.     Pulses: Normal pulses.     Heart sounds: Murmur heard.  Pulmonary:     Effort: Pulmonary effort is normal.  Breath sounds: Normal breath sounds. No wheezing.  Abdominal:     General: Bowel sounds are normal.     Palpations: Abdomen is soft. There is no mass.     Tenderness: There is no guarding.  Musculoskeletal:        General: No swelling. Normal range of motion.     Cervical back: Normal range of motion and neck supple. No rigidity.     Right lower leg: No edema.     Left lower leg: No edema.  Lymphadenopathy:     Cervical: No cervical adenopathy.  Skin:    General: Skin is warm and dry.     Findings: No rash.  Neurological:     General: No focal deficit present.     Mental Status: He is alert and oriented to person, place, and time.     Cranial Nerves: No cranial nerve deficit.     Deep Tendon Reflexes: Reflexes normal.  Psychiatric:        Mood and Affect: Mood  normal.        Behavior: Behavior normal.     BP 122/74   Pulse 71   Resp 16   Ht 5' 11 (1.803 m)   Wt 194 lb 3.2 oz (88.1 kg)   SpO2 98%   BMI 27.09 kg/m  Wt Readings from Last 3 Encounters:  02/16/24 194 lb 3.2 oz (88.1 kg)  11/11/23 194 lb 3.2 oz (88.1 kg)  11/07/23 194 lb 6.4 oz (88.2 kg)    Diabetic Foot Exam - Simple   No data filed    Lab Results  Component Value Date   WBC 3.9 (L) 11/08/2023   HGB 13.2 11/08/2023   HCT 40.9 11/08/2023   PLT 201.0 11/08/2023   GLUCOSE 108 (H) 11/08/2023   CHOL 156 11/08/2023   TRIG 137.0 11/08/2023   HDL 37.00 (L) 11/08/2023   LDLDIRECT 84.0 05/02/2018   LDLCALC 91 11/08/2023   ALT 13 11/08/2023   AST 17 11/08/2023   NA 140 11/08/2023   K 4.7 11/08/2023   CL 100 11/08/2023   CREATININE 1.68 (H) 11/08/2023   BUN 30 (H) 11/08/2023   CO2 33 (H) 11/08/2023   TSH 1.72 11/08/2023   PSA 16.25 (H) 01/20/2023   INR 2.2 09/06/2018   HGBA1C 6.5 11/08/2023   MICROALBUR 0.9 12/29/2009    Lab Results  Component Value Date   TSH 1.72 11/08/2023   Lab Results  Component Value Date   WBC 3.9 (L) 11/08/2023   HGB 13.2 11/08/2023   HCT 40.9 11/08/2023   MCV 80.1 11/08/2023   PLT 201.0 11/08/2023   Lab Results  Component Value Date   NA 140 11/08/2023   K 4.7 11/08/2023   CO2 33 (H) 11/08/2023   GLUCOSE 108 (H) 11/08/2023   BUN 30 (H) 11/08/2023   CREATININE 1.68 (H) 11/08/2023   BILITOT 0.5 11/08/2023   ALKPHOS 42 11/08/2023   AST 17 11/08/2023   ALT 13 11/08/2023   PROT 7.4 11/08/2023   ALBUMIN  4.3 11/08/2023   CALCIUM  9.9 11/08/2023   ANIONGAP 11 01/21/2023   EGFR 47 (L) 06/09/2022   GFR 40.82 (L) 11/08/2023   Lab Results  Component Value Date   CHOL 156 11/08/2023   Lab Results  Component Value Date   HDL 37.00 (L) 11/08/2023   Lab Results  Component Value Date   LDLCALC 91 11/08/2023   Lab Results  Component Value Date   TRIG 137.0 11/08/2023   Lab Results  Component Value Date   CHOLHDL 4  11/08/2023   Lab Results  Component Value Date   HGBA1C 6.5 11/08/2023       Assessment & Plan:  Atrial fibrillation, unspecified type (HCC) Assessment & Plan: Rate controlled and tolerating current meds, follows with cardiology   Essential hypertension Assessment & Plan: Well controlled, no changes to meds. Encouraged heart healthy diet such as the DASH diet and exercise as tolerated.    Mixed hyperlipidemia Assessment & Plan: Encourage heart healthy diet such as MIND or DASH diet, increase exercise, avoid trans fats, simple carbohydrates and processed foods, consider a krill or fish or flaxseed oil cap daily. Pravastatin  tolerating    Secondary hyperparathyroidism of renal origin [facility]) Assessment & Plan: Hydrate and monitor    Stage 3b chronic kidney disease (HCC) Assessment & Plan: Hydrate and monitor    Preventative health care Assessment & Plan: Patient encouraged to maintain heart healthy diet, regular exercise, adequate sleep. Consider daily probiotics. Take medications as prescribed. Labs ordered and reviewed. Given and reviewed copy of ACP documents from [facility] Secretary of State and encouraged to complete and return  Colonoscopy 2023 repeat in 2028   Type 2 diabetes mellitus with diabetic chronic kidney disease, unspecified CKD stage, unspecified whether long term insulin  use (HCC)  High risk medication use    Assessment and Plan Assessment & Plan Adult Wellness Visit Routine wellness visit with emphasis on preventive care, balanced diet, regular exercise, and hydration. - Order routine lab work including glucose, lipid panel, and thyroid  function tests - Administer flu vaccine today  Type 2 diabetes mellitus with diabetic chronic kidney disease, stage 3b Chronic condition. No acute issues. - Continue current management and monitoring - Order routine lab work to monitor renal function and glucose levels  Essential hypertension Chronic condition. No  acute issues. - Continue current management and monitoring  Mixed hyperlipidemia Chronic condition. No acute issues. - Continue current management and monitoring - Order routine lab work to monitor lipid levels  Chronic gout Chronic condition. No acute issues. - Continue current management and monitoring  Secondary hyperparathyroidism of renal origin Chronic condition. No acute issues. - Continue current management and monitoring  Secondary hypercoagulable state Chronic condition. No acute issues. - Continue current management and monitoring  Insomnia Chronic condition. No acute issues. - Continue current management and monitoring  Elevated PSA under urology surveillance PSA levels fluctuate but are under urology surveillance. Recent PSA was 11.2, previously 13. No new urinary symptoms. Monitoring approach due to age and fluctuating levels. Discussed potential for aggressive intervention if PSA spikes significantly, considering procedural risks. - Continue urology follow-up and monitoring  Recording duration: 33 minutes     Harlene Horton, MD

## 2024-02-16 NOTE — Telephone Encounter (Signed)
Patient came in to office today and picked up 4 boxes of patient assistance Ozempic.

## 2024-02-17 LAB — LIPID PANEL
Cholesterol: 142 mg/dL (ref 0–200)
HDL: 38.9 mg/dL — ABNORMAL LOW (ref >39.00)
LDL Cholesterol: 85 mg/dL (ref 0–99)
NonHDL: 103.34
Total CHOL/HDL Ratio: 4
Triglycerides: 92 mg/dL (ref 0.0–149.0)
VLDL: 18.4 mg/dL (ref 0.0–40.0)

## 2024-02-17 LAB — CBC WITH DIFFERENTIAL/PLATELET
Basophils Absolute: 0 K/uL (ref 0.0–0.1)
Basophils Relative: 1.2 % (ref 0.0–3.0)
Eosinophils Absolute: 0.2 K/uL (ref 0.0–0.7)
Eosinophils Relative: 3.8 % (ref 0.0–5.0)
HCT: 43 % (ref 39.0–52.0)
Hemoglobin: 14 g/dL (ref 13.0–17.0)
Lymphocytes Relative: 43.8 % (ref 12.0–46.0)
Lymphs Abs: 1.7 K/uL (ref 0.7–4.0)
MCHC: 32.5 g/dL (ref 30.0–36.0)
MCV: 79.5 fl (ref 78.0–100.0)
Monocytes Absolute: 0.7 K/uL (ref 0.1–1.0)
Monocytes Relative: 16.3 % — ABNORMAL HIGH (ref 3.0–12.0)
Neutro Abs: 1.4 K/uL (ref 1.4–7.7)
Neutrophils Relative %: 34.9 % — ABNORMAL LOW (ref 43.0–77.0)
Platelets: 182 K/uL (ref 150.0–400.0)
RBC: 5.41 Mil/uL (ref 4.22–5.81)
RDW: 15.6 % — ABNORMAL HIGH (ref 11.5–15.5)
WBC: 4 K/uL (ref 4.0–10.5)

## 2024-02-17 LAB — COMPREHENSIVE METABOLIC PANEL WITH GFR
ALT: 12 U/L (ref 0–53)
AST: 17 U/L (ref 0–37)
Albumin: 4.5 g/dL (ref 3.5–5.2)
Alkaline Phosphatase: 39 U/L (ref 39–117)
BUN: 32 mg/dL — ABNORMAL HIGH (ref 6–23)
CO2: 32 meq/L (ref 19–32)
Calcium: 10.3 mg/dL (ref 8.4–10.5)
Chloride: 100 meq/L (ref 96–112)
Creatinine, Ser: 1.86 mg/dL — ABNORMAL HIGH (ref 0.40–1.50)
GFR: 36.06 mL/min — ABNORMAL LOW
Glucose, Bld: 106 mg/dL — ABNORMAL HIGH (ref 70–99)
Potassium: 4.9 meq/L (ref 3.5–5.1)
Sodium: 140 meq/L (ref 135–145)
Total Bilirubin: 0.7 mg/dL (ref 0.2–1.2)
Total Protein: 7.5 g/dL (ref 6.0–8.3)

## 2024-02-17 LAB — HEMOGLOBIN A1C: Hgb A1c MFr Bld: 6.8 % — ABNORMAL HIGH (ref 4.6–6.5)

## 2024-02-17 LAB — TSH: TSH: 1.36 u[IU]/mL (ref 0.35–5.50)

## 2024-02-19 ENCOUNTER — Ambulatory Visit: Payer: Self-pay | Admitting: Family Medicine

## 2024-02-19 DIAGNOSIS — N1832 Chronic kidney disease, stage 3b: Secondary | ICD-10-CM

## 2024-02-19 LAB — DRUG MONITORING PANEL 376104, URINE
Alphahydroxyalprazolam: NEGATIVE ng/mL (ref <25)
Alphahydroxymidazolam: NEGATIVE ng/mL (ref <50)
Alphahydroxytriazolam: NEGATIVE ng/mL (ref <50)
Aminoclonazepam: NEGATIVE ng/mL (ref <25)
Amphetamines: NEGATIVE ng/mL (ref <500)
Barbiturates: NEGATIVE ng/mL (ref <300)
Benzodiazepines: POSITIVE ng/mL — AB (ref <100)
Cocaine Metabolite: NEGATIVE ng/mL (ref <150)
Desmethyltramadol: NEGATIVE ng/mL (ref <100)
Hydroxyethylflurazepam: NEGATIVE ng/mL (ref <50)
Lorazepam: 678 ng/mL — ABNORMAL HIGH (ref <50)
Nordiazepam: NEGATIVE ng/mL (ref <50)
Opiates: NEGATIVE ng/mL (ref <100)
Oxazepam: 939 ng/mL — ABNORMAL HIGH (ref <50)
Oxycodone: NEGATIVE ng/mL (ref <100)
Temazepam: >8000 ng/mL — ABNORMAL HIGH (ref <50)
Tramadol: NEGATIVE ng/mL (ref <100)

## 2024-02-19 LAB — DM TEMPLATE

## 2024-02-21 NOTE — Progress Notes (Signed)
 Patient was advised.

## 2024-02-28 ENCOUNTER — Ambulatory Visit: Admitting: Podiatry

## 2024-02-28 ENCOUNTER — Encounter: Payer: Self-pay | Admitting: Podiatry

## 2024-02-28 DIAGNOSIS — M79675 Pain in left toe(s): Secondary | ICD-10-CM

## 2024-02-28 DIAGNOSIS — M79674 Pain in right toe(s): Secondary | ICD-10-CM

## 2024-02-28 DIAGNOSIS — E1142 Type 2 diabetes mellitus with diabetic polyneuropathy: Secondary | ICD-10-CM

## 2024-02-28 DIAGNOSIS — B351 Tinea unguium: Secondary | ICD-10-CM

## 2024-02-29 NOTE — Progress Notes (Signed)
  Subjective:  Patient ID: [name], male    DOB: [birth year],  MRN: [id number]  [name] presents to clinic today for at risk foot care with history of diabetic neuropathy and painful mycotic toenails of both feet that are difficult to trim. Pain interferes with daily activities and wearing enclosed shoe gear comfortably.  Chief Complaint  Patient presents with   Diabetes    [facility] NIDDM A1C 6.1 Toenail trim. LOV with PCP 02/16/24.   New problem(s): None.   PCP is Domenica Harlene LABOR, MD.  Allergies  Allergen Reactions   Sulfonamide Derivatives Other (See Comments)    Flu like symptoms    Zolpidem  Other (See Comments)    Excessive, prolonged sedation    Review of Systems: Negative except as noted in the HPI.  Objective: No changes noted in today's physical examination. There were no vitals filed for this visit. [name] is a pleasant 73 y.o. male WD, WN in NAD. AAO x 3.  Vascular Examination: Capillary refill time immediate b/l. Palpable pedal pulses. Pedal hair present b/l. No pain with calf compression b/l. Skin temperature gradient WNL b/l. No cyanosis or clubbing b/l. No ischemia or gangrene noted b/l.   Neurological Examination: Sensation grossly intact b/l with 10 gram monofilament. Vibratory sensation intact b/l. Pt has subjective symptoms of neuropathy.  Dermatological Examination: Pedal skin with normal turgor, texture and tone b/l.  No open wounds. No interdigital macerations.   Toenails 1-5 b/l thick, discolored, elongated with subungual debris and pain on dorsal palpation.   No hyperkeratotic nor porokeratotic lesions present on today's visit.  Musculoskeletal Examination: Muscle strength 5/5 to all lower extremity muscle groups bilaterally. Hammertoe(s) 2-5 bilaterally. No pain, crepitus or joint limitation noted with ROM b/l LE.  Patient ambulates independently without assistive aids.  Radiographs: None  Assessment/Plan: 1. Pain due to  onychomycosis of toenails of both feet   2. Diabetic peripheral neuropathy associated with type 2 diabetes mellitus (HCC)   Patient was evaluated and treated. All patient's and/or POA's questions/concerns addressed on today's visit. Toenails 1-5 debrided in length and girth without incident. Treatment was provided by assistant [name] under my supervision. Continue foot and shoe inspections daily. Monitor blood glucose per PCP/Endocrinologist's recommendations. Continue soft, supportive shoe gear daily. Report any pedal injuries to medical professional. Call office if there are any questions/concerns. Return in about 3 months (around 05/29/2024).  [name], DPM      [facility] LOCATION: [address], [place] [address]                   Office [phone]   [facility] LOCATION: [address] [place], [place] [address] Office [phone]

## 2024-03-08 ENCOUNTER — Ambulatory Visit (INDEPENDENT_AMBULATORY_CARE_PROVIDER_SITE_OTHER): Admitting: *Deleted

## 2024-03-08 VITALS — BP 110/70 | HR 69 | Temp 96.9°F | Ht 71.0 in | Wt 191.8 lb

## 2024-03-08 DIAGNOSIS — Z0000 Encounter for general adult medical examination without abnormal findings: Secondary | ICD-10-CM

## 2024-03-08 NOTE — Progress Notes (Signed)
 Subjective:   [name] is a 73 y.o. who presents for a Medicare Wellness preventive visit.  As a reminder, Annual Wellness Visits don't include a physical exam, and some assessments may be limited, especially if this visit is performed virtually. We may recommend an in-person follow-up visit with your provider if needed.  Visit Complete: Virtual I connected with  [name] on 03/08/24 by a audio enabled telemedicine application and verified that I am speaking with the correct person using two identifiers.  Patient Location: Home  Provider Location: Office/Clinic  I discussed the limitations of evaluation and management by telemedicine. The patient expressed understanding and agreed to proceed.  Vital Signs: Because this visit was a virtual/telehealth visit, some criteria may be missing or patient reported. Any vitals not documented were not able to be obtained and vitals that have been documented are patient reported.  VideoDeclined- This patient declined Librarian, academic. Therefore the visit was completed with audio only.  Persons Participating in Visit: Patient.  AWV Questionnaire: No: Patient Medicare AWV questionnaire was not completed prior to this visit.  Cardiac Risk Factors include: advanced age (>75men, >74 women);male gender;diabetes mellitus;dyslipidemia;hypertension;Other (see comment), Risk factor comments: OSA, Afib, CKD, aortic valve insufficiency     Objective:    Today's Vitals   03/08/24 1100  BP: 110/70  Pulse: 69  Temp: (!) 96.9 F (36.1 C)  SpO2: 98%  Weight: 191 lb 12.8 oz (87 kg)  Height: 5' 11 (1.803 m)   Body mass index is 26.75 kg/m.     03/08/2024   11:15 AM 03/10/2020    5:57 AM 03/07/2020   10:04 AM 08/24/2019    8:06 PM 08/16/2019    7:14 AM 07/17/2019    7:52 AM 05/28/2019    1:00 AM  Advanced Directives  Does Patient Have a Medical Advance Directive? No No No No No No No  Would patient like  information on creating a medical advance directive? Yes (MAU/Ambulatory/Procedural Areas - Information given) No - Patient declined No - Patient declined No - Patient declined No - Patient declined      Current Medications (verified) Outpatient Encounter Medications as of 03/08/2024  Medication Sig   allopurinol  (ZYLOPRIM ) 100 MG tablet Take 1 tablet by mouth once daily   apixaban  (ELIQUIS ) 5 MG TABS tablet Take 1 tablet by mouth twice daily   calcium  carbonate (TUMS - DOSED IN MG ELEMENTAL CALCIUM ) 500 MG chewable tablet Chew 500 mg by mouth daily as needed for indigestion or heartburn.   cetirizine  (ZYRTEC ) 10 MG tablet Take 1 tablet (10 mg total) by mouth daily.   Clobetasol  Prop Emollient Base (CLOBETASOL  PROPIONATE E) 0.05 % emollient cream Apply 1 application topically 2 (two) times daily as needed (insect bite).   colchicine  (COLCRYS ) 0.6 MG tablet Take one tablet for 4-6 days as needed for gout flare up.   dapagliflozin  propanediol (FARXIGA ) 10 MG TABS tablet Take 1 tablet (10 mg total) by mouth daily.   fish oil-omega-3 fatty acids  1000 MG capsule Take 1 g by mouth daily.   furosemide  (LASIX ) 80 MG tablet TAKE 1 TABLET BY MOUTH ONCE DAILY AS NEEDED. MAY TAKE AN ADDITIONAL 40MG  (1/2 TABLET) AS DIRECTED PER ALLEVIATE RESEARCH STUDY PRN PLAN.   gabapentin  (NEURONTIN ) 100 MG capsule Take 2 capsules (200 mg total) by mouth at bedtime.   glucose blood (ACCU-CHEK GUIDE TEST) test strip USE TO TEST BLOOD SUGAR ONCE TO TWICE DAILY   hydrALAZINE  (APRESOLINE ) 10 MG  tablet TAKE 1 TABLET BY MOUTH THREE TIMES DAILY   HYDROcodone -acetaminophen  (NORCO) 10-325 MG tablet Take 1 tablet by mouth every 8 (eight) hours as needed for moderate pain or severe pain.   hydrocortisone  2.5 % ointment Apply topically 2 (two) times daily as needed.   isosorbide  mononitrate (IMDUR ) 30 MG 24 hr tablet Take 1 tablet by mouth once daily   LORazepam  (ATIVAN ) 1 MG tablet Take 1 tablet (1 mg total) by mouth every 8 (eight)  hours as needed. for anxiety   losartan  (COZAAR ) 25 MG tablet Take 1/2 (one-half) tablet by mouth once daily   magnesium  oxide (MAG-OX) 400 MG tablet Take 1 tablet (400 mg total) by mouth 2 (two) times daily.   metoprolol  succinate (TOPROL -XL) 50 MG 24 hr tablet TAKE 1 TABLET BY MOUTH IN THE MORNING AND 1 AT BEDTIME (TAKE  WITH  OR  FOLLOWING  A  MEAL)   Multiple Vitamin (MULTIVITAMIN WITH MINERALS) TABS tablet Take 1 tablet by mouth daily.    potassium chloride  SA (KLOR-CON  M20) 20 MEQ tablet TAKE 1 TABLET BY MOUTH ONCE DAILY AS DIRECTED PER  ALLEVIATE  RESEARCH  PROTOCOL   pravastatin  (PRAVACHOL ) 40 MG tablet Take 1 tablet by mouth once daily   Semaglutide ,0.25 or 0.5MG /DOS, 2 MG/3ML SOPN Inject 0.5 mg into the skin once a week.   tamsulosin  (FLOMAX ) 0.4 MG CAPS capsule Take 1 capsule by mouth once daily   temazepam  (RESTORIL ) 30 MG capsule Take 1 capsule by mouth at bedtime   tiZANidine  (ZANAFLEX ) 2 MG tablet Take 0.5-1 tablets (1-2 mg total) by mouth at bedtime as needed for muscle spasms.   VITAMIN D PO Take by mouth.   Accu-Chek Softclix Lancets lancets Use as instructed   albuterol  (VENTOLIN  HFA) 108 (90 Base) MCG/ACT inhaler Inhale 2 puffs into the lungs every 6 (six) hours as needed for wheezing or shortness of breath.   Alcohol  Swabs  (DROPSAFE ALCOHOL  PREP) 70 % PADS USE AS DIRECTED   Blood Glucose Monitoring Suppl (ACCU-CHEK GUIDE ME) w/Device KIT USE TO TEST BLOOD SUGAR ONCE TO TWICE DAILY   [DISCONTINUED] clobetasol  ointment (TEMOVATE ) 0.05 % 1 application to affected area   No facility-administered encounter medications on file as of 03/08/2024.    Allergies (verified) Sulfonamide derivatives and Zolpidem    History: Past Medical History:  Diagnosis Date   AORTIC STENOSIS, MODERATE 12/05/2009   Arthritis    Atrial fibrillation (HCC)    Atrial fibrillation with RVR (HCC) 07/05/2017   Atrial flutter (HCC) 12/05/2009   Atypical chest pain 11/08/2011   BENIGN PROSTATIC  HYPERTROPHY, HX OF 12/05/2009   CHEST PAIN-UNSPECIFIED 11/11/2009   CHF 12/05/2009   Chronic renal insufficiency 12/16/2016   Debility 04/18/2012   Diarrhea 12/16/2016   DYSPNEA 12/19/2009   Edema 04/18/2012   ERECTILE DYSFUNCTION 01/25/2007   FATIGUE, CHRONIC 11/11/2009   Gout 07/10/2012   Heart murmur    History of kidney stones    they passed (07/05/2017)   Hyperkalemia 04/18/2012   Hyperlipidemia    Hypertension    HYPERTENSION 01/25/2007   Hypoxia 05/05/2011   Insomnia 05/13/2016   INSOMNIA, HX OF 01/25/2007   Knee pain, bilateral 12/28/2010   Knee pain, right 12/28/2010   NEUROMA 01/05/2010   OBESITY NOS 01/25/2007   OSA on CPAP 04/06/2010   PERIPHERAL NEUROPATHY 01/05/2010   PULMONARY FUNCTION TESTS, ABNORMAL 02/02/2010   Sleep apnea    wears CPAP   Superficial thrombophlebitis of left leg 09/07/2011   TESTICULAR HYPOFUNCTION 01/05/2010  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   Toe pain, right 07/10/2012   Type II diabetes mellitus (HCC) 12/05/2009   UNSPECIFIED ANEMIA 01/05/2010   Past Surgical History:  Procedure Laterality Date   A-FLUTTER ABLATION N/A 05/16/2019   Procedure: A-FLUTTER ABLATION;  Surgeon: Inocencio Soyla Lunger, MD;  Location: [facility] INVASIVE CV LAB;  Service: Cardiovascular;  Laterality: N/A;   ATRIAL FIBRILLATION ABLATION N/A 05/16/2019   Procedure: ATRIAL FIBRILLATION ABLATION;  Surgeon: Inocencio Soyla Lunger, MD;  Location: [facility] INVASIVE CV LAB;  Service: Cardiovascular;  Laterality: N/A;   ATRIAL FIBRILLATION ABLATION N/A 03/10/2020   Procedure: ATRIAL FIBRILLATION ABLATION;  Surgeon: Inocencio Soyla Lunger, MD;  Location: [facility] INVASIVE CV LAB;  Service: Cardiovascular;  Laterality: N/A;   CARDIAC CATHETERIZATION  10/16/2009   nonischemic cardiomyopathy   CARDIOVERSION N/A 03/25/2015   Procedure: CARDIOVERSION;  Surgeon: Vinie JAYSON Maxcy, MD;  Location: [facility] ENDOSCOPY;  Service: Cardiovascular;  Laterality: N/A;   CARDIOVERSION N/A 06/17/2017   Procedure:  CARDIOVERSION;  Surgeon: Francyne Headland, MD;  Location: [facility] ENDOSCOPY;  Service: Cardiovascular;  Laterality: N/A;   CARDIOVERSION N/A 07/08/2017   Procedure: CARDIOVERSION;  Surgeon: Okey Vina GAILS, MD;  Location: [facility] ENDOSCOPY;  Service: Cardiovascular;  Laterality: N/A;   CARDIOVERSION N/A 02/27/2019   Procedure: CARDIOVERSION;  Surgeon: Francyne Headland, MD;  Location: [facility] ENDOSCOPY;  Service: Cardiovascular;  Laterality: N/A;   CARDIOVERSION N/A 08/16/2019   Procedure: CARDIOVERSION;  Surgeon: Francyne Headland, MD;  Location: [facility] ENDOSCOPY;  Service: Cardiovascular;  Laterality: N/A;   COLONOSCOPY  03/10/2021   1st colonoscopy , [facility], [facility], large transverse polyp, clip and spot applied   METATARSAL OSTEOTOMY Bilateral ~ 1980   removed part of 5th metatarsal to corect curvature of toes    RIGHT HEART CATH N/A 05/16/2019   Procedure: RIGHT HEART CATH;  Surgeon: Cherrie Toribio SAUNDERS, MD;  Location: [facility] INVASIVE CV LAB;  Service: Cardiovascular;  Laterality: N/A;   TEE WITHOUT CARDIOVERSION  05/16/2019   Procedure: Transesophageal Echocardiogram (Tee);  Surgeon: Inocencio Soyla Lunger, MD;  Location: [facility] INVASIVE CV LAB;  Service: Cardiovascular;;   TEE WITHOUT CARDIOVERSION N/A 03/07/2020   Procedure: TRANSESOPHAGEAL ECHOCARDIOGRAM (TEE);  Surgeon: Hobart Powell BRAVO, MD;  Location: [facility] ENDOSCOPY;  Service: Cardiovascular;  Laterality: N/A;   Family History  Problem Relation Age of Onset   Clotting disorder Mother    Heart disease Mother        s/p MI   Heart attack Mother    Hypertension Mother    Diabetes Mother    Hyperlipidemia Mother    Stroke Mother    Cancer Father        ? lung   Lung disease Father        smoker   Diabetes Sister    Hypertension Sister        smoker   Aneurysm Sister        brain   Other Sister        clipped   Seizures Sister        d/o w/aneurysm/ smoker   Leukemia Maternal Grandmother        ?   Colon cancer Neg Hx    Colon polyps Neg Hx     Stomach cancer Neg Hx    Rectal cancer Neg Hx    Esophageal cancer Neg Hx    Social History   Socioeconomic History   Marital status: Married    Spouse name: Not on file   Number of children: 4   Years of  education: 12   Highest education level: High school graduate  Occupational History   Occupation: retired    Associate Professor: [employer]  Tobacco Use   Smoking status: Former    Current packs/day: 0.00    Average packs/day: 1 pack/day for 10.0 years (10.0 ttl pk-yrs)    Types: Cigarettes    Start date: 06/07/1970    Quit date: 06/07/1980    Years since quitting: 43.7   Smokeless tobacco: Never  Vaping Use   Vaping status: Never Used  Substance and Sexual Activity   Alcohol  use: No   Drug use: No   Sexual activity: Not Currently  Other Topics Concern   Not on file  Social History Narrative   Lives with male partner in a one story home.  His son lives there off and on.  Retired from [employer].  Education: high school.  Right handed   Social Drivers of Health   Financial Resource Strain: Low Risk  (03/08/2024)   Overall Financial Resource Strain (CARDIA)    Difficulty of Paying Living Expenses: Not hard at all  Food Insecurity: No Food Insecurity (03/08/2024)   Hunger Vital Sign    Worried About Running Out of Food in the Last Year: Never true    Ran Out of Food in the Last Year: Never true  Transportation Needs: No Transportation Needs (03/08/2024)   PRAPARE - Administrator, Civil Service (Medical): No    Lack of Transportation (Non-Medical): No  Physical Activity: Sufficiently Active (03/08/2024)   Exercise Vital Sign    Days of Exercise per Week: 7 days    Minutes of Exercise per Session: 60 min  Stress: No Stress Concern Present (03/08/2024)   [employer] of Occupational Health - Occupational Stress Questionnaire    Feeling of Stress: Not at all  Social Connections: Moderately Isolated (03/08/2024)   Social Connection and Isolation Panel    Frequency of  Communication with Friends and Family: More than three times a week    Frequency of Social Gatherings with Friends and Family: More than three times a week    Attends Religious Services: Never    Database administrator or Organizations: No    Attends Engineer, structural: Never    Marital Status: Married    Tobacco Counseling Counseling given: Not Answered    Clinical Intake:  Pre-visit preparation completed: Yes  Pain : No/denies pain     BMI - recorded: 26.75 Nutritional Status: BMI 25 -29 Overweight Nutritional Risks: None Diabetes: Yes CBG done?: No Did pt. bring in CBG monitor from home?: No  Lab Results  Component Value Date   HGBA1C 6.8 (H) 02/16/2024   HGBA1C 6.5 11/08/2023   HGBA1C 5.7 (A) 05/10/2023     How often do you need to have someone help you when you read instructions, pamphlets, or other written materials from your doctor or pharmacy?: 1 - Never What is the last grade level you completed in school?: 12  Interpreter Needed?: No  Information entered by :: Azarel Banner, CMA(AAMA)   Activities of Daily Living     03/08/2024   11:08 AM  In your present state of health, do you have any difficulty performing the following activities:  Hearing? 0  Vision? 0  Difficulty concentrating or making decisions? 0  Walking or climbing stairs? 0  Dressing or bathing? 0  Doing errands, shopping? 0  Preparing Food and eating ? N  Using the Toilet? N  In the  past six months, have you accidently leaked urine? N  Do you have problems with loss of bowel control? N  Managing your Medications? N  Managing your Finances? N  Housekeeping or managing your Housekeeping? N    Patient Care Team: Domenica Harlene LABOR, MD as PCP - General (Family Medicine) Croitoru, Jerel, MD as PCP - Cardiology (Cardiology) Inocencio Soyla Lunger, MD as PCP - Electrophysiology (Cardiology) Croitoru, Jerel, MD (Cardiology) Haverstock, Tawni CROME, MD as Consulting Physician  (Dermatology) Jude Harden GAILS, MD as Consulting Physician (Pulmonary Disease) Watt Rush, MD as Consulting Physician (Urology) Skeet Juliene SAUNDERS, DO as Consulting Physician (Neurology) [facility] (Ophthalmology)  I have updated your Care Teams any recent Medical Services you may have received from other providers in the past year.     Assessment:   This is a routine wellness examination for [place].  Hearing/Vision screen Hearing Screening - Comments:: Denies hearing difficulties.  Vision Screening - Comments:: Up to date with routine eye exams with [facility] (has appt this month)   Goals Addressed             This Visit's Progress    Maintain current health status   On track      Depression Screen     03/08/2024   11:10 AM 02/16/2024    2:46 PM 11/07/2023    3:41 PM 09/21/2022    3:44 PM 03/09/2022   11:09 AM 11/03/2021   10:22 AM 07/07/2021    9:57 AM  PHQ 2/9 Scores  PHQ - 2 Score 0 0 0 0 0 0 0  PHQ- 9 Score 3 0  0 0      Fall Risk     03/08/2024   11:17 AM 02/16/2024    2:46 PM 09/21/2022    3:44 PM 03/09/2022   11:08 AM 11/03/2021   10:22 AM  Fall Risk   Falls in the past year? 0 0 0 0 0  Number falls in past yr: 0 0 0 0 0  Injury with Fall? 0 0 0 0 0  Risk for fall due to : Orthopedic patient No Fall Risks   No Fall Risks  Follow up Education provided Falls evaluation completed Falls evaluation completed Falls evaluation completed  Falls evaluation completed      Data saved with a previous flowsheet row definition    MEDICARE RISK AT HOME:  Medicare Risk at Home Any stairs in or around the home?: Yes If so, are there any without handrails?: No Home free of loose throw rugs in walkways, pet beds, electrical cords, etc?: Yes Adequate lighting in your home to reduce risk of falls?: Yes Life alert?: No Use of a cane, walker or w/c?: Yes (cane and knee brace only when right knee flares up) Grab bars in the bathroom?: No Shower chair or  bench in shower?: No Elevated toilet seat or a handicapped toilet?: No  TIMED UP AND GO:  Was the test performed?  No,audio  Cognitive Function: 6CIT completed        03/08/2024   11:17 AM  6CIT Screen  What Year? 0 points  What month? 0 points  What time? 0 points  Count back from 20 0 points  Months in reverse 4 points  Repeat phrase 0 points  Total Score 4 points    Immunizations Immunization History  Administered Date(s) Administered   Fluad Quad(high Dose 65+) 05/22/2020   Fluad Trivalent(High Dose 65+) 06/27/2023   INFLUENZA, HIGH DOSE SEASONAL  PF 02/16/2024   Influenza Split 03/03/2011, 03/10/2012, 04/07/2018   Influenza Whole 03/28/2008, 03/30/2010   Influenza, Seasonal, Injecte, Preservative Fre 01/31/2013   Influenza,inj,Quad PF,6+ Mos 01/31/2013, 03/07/2014, 04/14/2015, 02/19/2016, 03/18/2017   Influenza-Unspecified 04/05/2019, 05/22/2020, 05/15/2021   PFIZER Comirnaty(Gray Top)Covid-19 Tri-Sucrose Vaccine 09/18/2020   PFIZER(Purple Top)SARS-COV-2 Vaccination 07/31/2019, 08/03/2019, 08/21/2019, 04/18/2020, 09/18/2020   PNEUMOCOCCAL CONJUGATE-20 07/07/2021   Pfizer Covid-19 Vaccine Bivalent Booster 80yrs & up 05/20/2021   Pneumococcal Polysaccharide-23 10/07/2009   Td 09/05/2009    Screening Tests Health Maintenance  Topic Date Due   Diabetic kidney evaluation - Urine ACR  12/30/2010   Medicare Annual Wellness (AWV)  05/03/2019   DTaP/Tdap/Td (2 - Tdap) 09/06/2019   COVID-19 Vaccine (8 - 2025-26 season) 06/06/2024 (Originally 02/06/2024)   Zoster Vaccines- Shingrix (1 of 2) 03/08/2025 (Originally 01/07/1970)   OPHTHALMOLOGY EXAM  03/23/2024   HEMOGLOBIN A1C  08/15/2024   FOOT EXAM  08/23/2024   Diabetic kidney evaluation - eGFR measurement  02/15/2025   Colonoscopy  04/23/2027   Pneumococcal Vaccine: 50+ Years  Completed   Influenza Vaccine  Completed   Hepatitis C Screening  Completed   HPV VACCINES  Aged Out   Meningococcal B Vaccine  Aged Out    Hepatitis B Vaccines 19-59 Average Risk  Discontinued    Health Maintenance Items Addressed: Will get tetanus vaccine at pharmacy. Will consider lab visit for urine microalbumin.  Additional Screening:  Vision Screening: Recommended annual ophthalmology exams for early detection of glaucoma and other disorders of the eye. Is the patient up to date with their annual eye exam?  Yes  Who is the provider or what is the name of the office in which the patient attends annual eye exams? [facility]  Dental Screening: Recommended annual dental exams for proper oral hygiene  Community Resource Referral / Chronic Care Management: CRR required this visit?  No   CCM required this visit?  No   Plan:    I have personally reviewed and noted the following in the patient's chart:   Medical and social history Use of alcohol , tobacco or illicit drugs  Current medications and supplements including opioid prescriptions. Patient is currently taking opioid prescriptions. Information provided to patient regarding non-opioid alternatives. Patient advised to discuss non-opioid treatment plan with their provider. Functional ability and status Nutritional status Physical activity Advanced directives List of other physicians Hospitalizations, surgeries, and ER visits in previous 12 months Vitals Screenings to include cognitive, depression, and falls Referrals and appointments  In addition, I have reviewed and discussed with patient certain preventive protocols, quality metrics, and best practice recommendations. A written personalized care plan for preventive services as well as general preventive health recommendations were provided to patient.   Lolita Libra, CMA   03/08/2024   After Visit Summary: (MyChart) Due to this being a telephonic visit, the after visit summary with patients personalized plan was offered to patient via MyChart   Notes: Nothing significant to report at this  time.

## 2024-03-08 NOTE — Patient Instructions (Addendum)
 [name] , Thank you for taking time out of your busy schedule to complete your Annual Wellness Visit with me. I enjoyed our conversation and look forward to speaking with you again next year. I, as well as your care team,  appreciate your ongoing commitment to your health goals. Please review the following plan we discussed and let me know if I can assist you in the future. Your Game plan/ To Do List    I have attached some information about the urine microalbumin testing that is needed.  Let me know if you have further questions.   If you are willing to proceed with testing after reading the information let us  know and we can set you up to come in for a quick lab visit to give a urine sample.  Follow up Visits: Next Medicare AWV with our clinical staff: 03/13/25 11am, telephone.  Next Office Visit with your provider: 08/20/24 11am, Dr Domenica.  Clinician Recommendations:  Aim for 30 minutes of exercise or brisk walking, 6-8 glasses of water, and 5 servings of fruits and vegetables each day.   You will need to get the following vaccines at your local pharmacy: tetanus      This is a list of the screening recommended for you and due dates:  Health Maintenance  Topic Date Due   Zoster (Shingles) Vaccine (1 of 2) Never done   Yearly kidney health urinalysis for diabetes  12/30/2010   Medicare Annual Wellness Visit  05/03/2019   DTaP/Tdap/Td vaccine (2 - Tdap) 09/06/2019   COVID-19 Vaccine (8 - 2025-26 season) 06/06/2024*   Eye exam for diabetics  03/23/2024   Hemoglobin A1C  08/15/2024   Complete foot exam   08/23/2024   Yearly kidney function blood test for diabetes  02/15/2025   Colon Cancer Screening  04/23/2027   Pneumococcal Vaccine for age over 3  Completed   Flu Shot  Completed   Hepatitis C Screening  Completed   HPV Vaccine  Aged Out   Meningitis B Vaccine  Aged Out   Hepatitis B Vaccine  Discontinued  *Topic was postponed. The date shown is not the original due date.     Please let me know if you do not receive your Advanced Directive Packet within 1 week. Once completed and notarized, you may return a copy to our office by either of the following.  Advanced directives: (Provided) Advance directive discussed with you today. I have provided a copy for you to complete at home and have notarized. Once this is complete, please bring a copy in to our office so we can scan it into your chart.  Advance Care Planning is important because it:  [x]  Makes sure you receive the medical care that is consistent with your values, goals, and preferences  [x]  It provides guidance to your family and loved ones and reduces their decisional burden about whether or not they are making the right decisions based on your wishes.  Follow the link provided in your after visit summary or read over the paperwork we have mailed to you to help you started getting your Advance Directives in place. If you need assistance in completing these, please reach out to us  so that we can help you!  See attachments for Preventive Care and Fall Prevention Tips.

## 2024-03-09 ENCOUNTER — Other Ambulatory Visit: Payer: Self-pay | Admitting: Family Medicine

## 2024-03-09 NOTE — Telephone Encounter (Signed)
 Requesting: lorazepam  and temazepam   Contract: 02/16/24 UDS: 02/16/24 Last Visit: 02/16/24 Next Visit: 08/20/24 Last Refill: see med list   Please Advise

## 2024-03-12 ENCOUNTER — Ambulatory Visit: Admitting: Family Medicine

## 2024-03-15 ENCOUNTER — Other Ambulatory Visit: Payer: Self-pay | Admitting: Family Medicine

## 2024-03-18 ENCOUNTER — Other Ambulatory Visit: Payer: Self-pay | Admitting: Cardiovascular Disease

## 2024-03-18 DIAGNOSIS — Z006 Encounter for examination for normal comparison and control in clinical research program: Secondary | ICD-10-CM

## 2024-03-22 ENCOUNTER — Other Ambulatory Visit

## 2024-03-28 ENCOUNTER — Other Ambulatory Visit (INDEPENDENT_AMBULATORY_CARE_PROVIDER_SITE_OTHER)

## 2024-03-28 DIAGNOSIS — N2581 Secondary hyperparathyroidism of renal origin: Secondary | ICD-10-CM | POA: Diagnosis not present

## 2024-03-28 DIAGNOSIS — M1A9XX0 Chronic gout, unspecified, without tophus (tophi): Secondary | ICD-10-CM | POA: Diagnosis not present

## 2024-03-28 DIAGNOSIS — N1832 Chronic kidney disease, stage 3b: Secondary | ICD-10-CM | POA: Diagnosis not present

## 2024-03-29 ENCOUNTER — Other Ambulatory Visit

## 2024-03-29 LAB — COMPREHENSIVE METABOLIC PANEL WITH GFR
ALT: 11 U/L (ref 0–53)
AST: 15 U/L (ref 0–37)
Albumin: 4.3 g/dL (ref 3.5–5.2)
Alkaline Phosphatase: 41 U/L (ref 39–117)
BUN: 29 mg/dL — ABNORMAL HIGH (ref 6–23)
CO2: 28 meq/L (ref 19–32)
Calcium: 9.1 mg/dL (ref 8.4–10.5)
Chloride: 100 meq/L (ref 96–112)
Creatinine, Ser: 1.88 mg/dL — ABNORMAL HIGH (ref 0.40–1.50)
GFR: 35.57 mL/min — ABNORMAL LOW (ref >60.00)
Glucose, Bld: 107 mg/dL — ABNORMAL HIGH (ref 70–99)
Potassium: 4.4 meq/L (ref 3.5–5.1)
Sodium: 139 meq/L (ref 135–145)
Total Bilirubin: 0.7 mg/dL (ref 0.2–1.2)
Total Protein: 7.2 g/dL (ref 6.0–8.3)

## 2024-03-29 LAB — URIC ACID: Uric Acid, Serum: 7.3 mg/dL (ref 4.0–7.8)

## 2024-03-29 LAB — PARATHYROID HORMONE, INTACT (NO CA): PTH: 49 pg/mL (ref 16–77)

## 2024-03-30 ENCOUNTER — Other Ambulatory Visit (HOSPITAL_COMMUNITY): Payer: Self-pay | Admitting: Family Medicine

## 2024-04-04 DIAGNOSIS — H40013 Open angle with borderline findings, low risk, bilateral: Secondary | ICD-10-CM | POA: Diagnosis not present

## 2024-04-10 NOTE — Progress Notes (Unsigned)
 Cardiology Office Note:  .   Date:  04/11/2024  ID:  [name], DOB 1950/07/11, MRN [id number] PCP: Kevin Harlene LABOR, MD  [facility] Providers Cardiologist:  Jerel Balding, MD Electrophysiologist:  Will Gladis Norton, MD {  History of Present Illness: .   [name] is a 73 y.o. male with history of heart failure with recovered EF, tachycardia mediated cardiomyopathy, aortic regurgitation, type 2 diabetes, persistent atrial fibrillation/AFL, hypertension, OSA, CKD, aortic aneurysm.     Cardiomyopathy Atrial fibrillation/flutter 2011 cardiac catheterization with no significant CAD. 2011 had atrial flutter  05/2019 after PVI procedure developed cardiogenic shock with respiratory failure requiring intubation secondary to atrial flutter RVR.  Had prolonged hospitalization and critically ill.  EF 10% but improved to 35 to 40% prior to discharge. 07/2019 recurrence of atrial fibrillation. 08/2019 successful DCCV 03/2020 repeat PVI and has maintained sinus rhythm since. 07/2021-06/2022 EF 45 to 50% 07/2023 EF normalized 50 to 55%.  Moderate AR.  42 mm dilatation.  Loop recorder removed.  Social history  Does woodworking Former smoker.  Quit 45 years ago No drugs or alcohol . Moderately active 3 to 4 days a week.  He says he does at all. Mother and sister had MI at ages 39 and 33. Single.     Patient with history of suspected tachycardia mediated cardiomyopathy secondary to atrial fibrillation/flutter with repeat PVI procedures x 2 but generally has been maintaining sinus rhythm for the past several years.  EF has recovered.  Previously followed by advanced heart failure now transitioned to general cardiology.  Felt to have NYHA class I symptoms.  GDMT limited by low blood pressure.  Patient is here for follow-up.  He is extremely diligent with all of his medical care and keeps a journal of his vital signs and takes blood pressure and heart rates every day.  Since about  September he has been noticing fluctuating heart rates although most of all of these are less than 100, has very brief bouts of heart rates in the 120s.  He reports that he feels completely fine though and nothing has changed from a symptom standpoint.  Still very active and able to complete all of his ADLs without any difficulty whatsoever.  Aside from these vital signs he is unaware of his atrial fibrillation/flutter and does not feel any palpitations.  Rate has been stable.  ROS: Denies: Chest pain, shortness of breath, orthopnea, peripheral edema, palpitations, decreased exercise intolerance, fatigue, lightheadedness.   Studies Reviewed: SABRA    EKG Interpretation Date/Time:  Wednesday April 11 2024 08:04:26 EST Ventricular Rate:  86 PR Interval:    QRS Duration:  104 QT Interval:  344 QTC Calculation: 411 R Axis:   101  Text Interpretation: Atrial flutter with variable A-V block Rightward axis Low voltage QRS Septal infarct , age undetermined ST & T wave abnormality, consider lateral ischemia When compared with ECG of 20-Jun-2023 11:30, Atrial flutter has replaced Sinus rhythm Confirmed by [name] [phone]) on 04/11/2024 8:06:27 AM    Risk Assessment/Calculations:    CHA2DS2-VASc Score = 4  This indicates a 4.8% annual risk of stroke. The patient's score is based upon: CHF History: 1 HTN History: 1 Diabetes History: 1 Stroke History: 0 Vascular Disease History: 0 Age Score: 1 Gender Score: 0      Physical Exam:   VS:  BP 120/70 (BP Location: Right Arm, Patient Position: Sitting, Cuff Size: Normal)   Pulse (!) 53   Resp 16   Ht  5' 11 (1.803 m)   Wt 192 lb 6.4 oz (87.3 kg)   SpO2 98%   BMI 26.83 kg/m    Wt Readings from Last 3 Encounters:  04/11/24 192 lb 6.4 oz (87.3 kg)  03/08/24 191 lb 12.8 oz (87 kg)  02/16/24 194 lb 3.2 oz (88.1 kg)    GEN: Well nourished, well developed in no acute distress NECK: No JVD; No carotid bruits CARDIAC: IRRR, no murmurs, rubs,  gallops RESPIRATORY:  Clear to auscultation without rales, wheezing or rhonchi  ABDOMEN: Soft, non-tender, non-distended EXTREMITIES:  No edema; No deformity   ASSESSMENT AND PLAN: .    Persistent atrial fibrillation/flutter -05/2019 PVI - 03/2020 repeat PVI, been in sinus since then. Chronically has maintained sinus rhythm since his ablation.  Since September notes fluctuating heart rates.  EKG today shows atrial flutter with variable conduction.  Heart rates have been well-controlled by his documentation less than 100. Continue with Eliquis  5 mg twice daily, he does not miss any doses of any of his medications.  Continue with Toprol -XL 50 mg in the morning and at night. Will place 2-week heart monitor to see if he is paroxysmal or truly persistent. Refer back to EP for consideration of possible ablation/management. PCP recently checked lab work, no obvious other secondary causes. Previously on amiodarone , reports he did not tolerate this well.  With renal disease Tikosyn  may be prohibitive.   Heart failure with recovered EF Tachycardia mediated cardiomyopathy NYHA class I -2011 normal cardiac catheterization -05/2019 post PVI procedure developed cardiogenic shock EF 10% - 07/2023 EF normalized 50 to 55%.  Moderately elevated PASP.  Mild MR.  PR moderate.  AR moderate. He is done extremely well over the years and without any issues today.  Remains to be euvolemic. Continue with Farxiga  10 mg, losartan  12.5 mg daily, Toprol -XL 50 mg twice daily, hydralazine  10 mg 3 times daily, Imdur  30 mg daily, Lasix  80 mg daily. Needs annual echocardiograms to monitor his valvular disease.    CKD Follows with nephrology in [place].  OSA on CPAP Wears it religiously.  Aortic dilatation 42 mm.  Needs annual echocardiograms.  Type 2 diabetes Well-controlled.  A1c 6.8% 02/2024.  Hyperlipidemia LDL 02/2024 was 85.  LDL goal less than 100 is reasonable. Continue with pravastatin  40 mg  daily.  Hypertension Well-controlled on the regimen above.    Dispo: Will arrange EP follow-up after his 2-week heart monitor.  Signed, Kevin LITTIE Sluder, PA-C

## 2024-04-11 ENCOUNTER — Ambulatory Visit (INDEPENDENT_AMBULATORY_CARE_PROVIDER_SITE_OTHER)

## 2024-04-11 ENCOUNTER — Ambulatory Visit: Attending: Cardiology | Admitting: Cardiology

## 2024-04-11 VITALS — BP 120/70 | HR 53 | Resp 16 | Ht 71.0 in | Wt 192.4 lb

## 2024-04-11 DIAGNOSIS — E782 Mixed hyperlipidemia: Secondary | ICD-10-CM

## 2024-04-11 DIAGNOSIS — I77810 Thoracic aortic ectasia: Secondary | ICD-10-CM | POA: Diagnosis not present

## 2024-04-11 DIAGNOSIS — G4733 Obstructive sleep apnea (adult) (pediatric): Secondary | ICD-10-CM | POA: Diagnosis not present

## 2024-04-11 DIAGNOSIS — I5020 Unspecified systolic (congestive) heart failure: Secondary | ICD-10-CM

## 2024-04-11 DIAGNOSIS — I10 Essential (primary) hypertension: Secondary | ICD-10-CM

## 2024-04-11 DIAGNOSIS — I4819 Other persistent atrial fibrillation: Secondary | ICD-10-CM

## 2024-04-11 NOTE — Progress Notes (Unsigned)
 ZIO serial # [id number] from office inventory applied to patient. Dr. Inocencio to read.

## 2024-04-11 NOTE — Patient Instructions (Addendum)
 Medication Instructions:  Your physician recommends that you continue on your current medications as directed. Please refer to the Current Medication list given to you today.  *If you need a refill on your cardiac medications before your next appointment, please call your pharmacy*   Testing/Procedures: ZIO XT- Long Term Monitor Instructions  Your physician has requested you wear a ZIO patch monitor for 14 days.  This is a single patch monitor. [employer] supplies one patch monitor per enrollment. Additional stickers are not available. Please do not apply patch if you will be having a Nuclear Stress Test,  Echocardiogram, Cardiac CT, MRI, or Chest Xray during the period you would be wearing the  monitor. The patch cannot be worn during these tests. You cannot remove and re-apply the  ZIO XT patch monitor.  Your ZIO patch monitor will be mailed 3 day USPS to your address on file. It may take 3-5 days  to receive your monitor after you have been enrolled.  Once you have received your monitor, please review the enclosed instructions. Your monitor  has already been registered assigning a specific monitor serial # to you.  Billing and Patient Assistance Program Information  We have supplied [employer] with any of your insurance information on file for billing purposes. [employer] offers a sliding scale Patient Assistance Program for patients that do not have  insurance, or whose insurance does not completely cover the cost of the ZIO monitor.  You must apply for the Patient Assistance Program to qualify for this discounted rate.  To apply, please call [employer] at [phone], select option 4, select option 2, ask to apply for  Patient Assistance Program. Meredeth will ask your household income, and how many people  are in your household. They will quote your out-of-pocket cost based on that information.  [employer] will also be able to set up a 63-month, interest-free payment plan if needed.   Removing  monitor  When you are ready to remove the patch, follow instructions on the last 2 pages of Patient  Logbook. Stick patch monitor onto the last page of Patient Logbook.  Place Patient Logbook in the blue and white box. Use locking tab on box and tape box closed  securely. The blue and white box has prepaid postage on it. Please place it in the mailbox as  soon as possible. Your physician should have your test results approximately 7 days after the  monitor has been mailed back to [facility].  Call [facility] Customer Care at [phone] if you have questions regarding  your ZIO XT patch monitor. Call them immediately if you see an orange light blinking on your  monitor.  If your monitor falls off in less than 4 days, contact our Monitor department at [phone].  If your monitor becomes loose or falls off after 4 days call [employer] at [phone] for  suggestions on securing your monitor   Follow-Up: At [facility], you and your health needs are our priority.  As part of our continuing mission to provide you with exceptional heart care, our providers are all part of one team.  This team includes your primary Cardiologist (physician) and Advanced Practice Providers or APPs (Physician Assistants and Nurse Practitioners) who all work together to provide you with the care you need, when you need it.  Your next appointment:   3 week(s)  Provider:   Jodie Passey, PA or Soyla Norton, MD

## 2024-04-12 NOTE — Progress Notes (Signed)
 Sounds good. He was very sick with his AFib in the past, but was also very tachycardic. Suspect he will get another ablation or amio

## 2024-04-19 ENCOUNTER — Other Ambulatory Visit: Payer: Self-pay | Admitting: Family Medicine

## 2024-04-19 NOTE — Telephone Encounter (Signed)
 Requesting: temazepam  30mg   Contract:02/16/24 UDS:02/16/24 Last Visit: 02/16/24 Next Visit:08/20/24 Last Refill: 03/11/24 #30 and 0RF   Please Advise

## 2024-04-23 DIAGNOSIS — H524 Presbyopia: Secondary | ICD-10-CM | POA: Diagnosis not present

## 2024-04-26 ENCOUNTER — Ambulatory Visit: Admitting: Student

## 2024-04-30 ENCOUNTER — Other Ambulatory Visit (HOSPITAL_COMMUNITY): Payer: Self-pay | Admitting: Internal Medicine

## 2024-04-30 DIAGNOSIS — I4819 Other persistent atrial fibrillation: Secondary | ICD-10-CM | POA: Diagnosis not present

## 2024-05-01 ENCOUNTER — Ambulatory Visit: Payer: Self-pay | Admitting: Cardiology

## 2024-05-01 DIAGNOSIS — I4819 Other persistent atrial fibrillation: Secondary | ICD-10-CM

## 2024-05-01 NOTE — Progress Notes (Unsigned)
  Electrophysiology Office Note:   Date:  05/01/2024  ID:  [name], [name] March 17, 1951, MRN [id number]  Primary Cardiologist: Jerel Balding, MD Primary Heart Failure: None Electrophysiologist: Josha Weekley Gladis Norton, MD  {Click to update primary MD,subspecialty MD or APP then REFRESH:1}    History of Present Illness:   [name] is a 73 y.o. male with h/o chronic systolic heart failure, atrial fibrillation/flutter, aortic regurgitation, sleep apnea, hypertension, hyperlipidemia, diabetes seen today for acute visit due to atrial fibrillation/flutter.    Patient reports ***.  he denies chest pain, palpitations, dyspnea, PND, orthopnea, nausea, vomiting, dizziness, syncope, edema, weight gain, or early satiety.   Review of systems complete and found to be negative unless listed in HPI.   EP Information / Studies Reviewed:    {EKGtoday:28818}        Risk Assessment/Calculations:    CHA2DS2-VASc Score = 4  {Confirm score is correct.  If not, click here to update score.  REFRESH note.  :1} This indicates a 4.8% annual risk of stroke. The patient's score is based upon: CHF History: 1 HTN History: 1 Diabetes History: 1 Stroke History: 0 Vascular Disease History: 0 Age Score: 1 Gender Score: 0   {This patient has a significant risk of stroke if diagnosed with atrial fibrillation.  Please consider VKA or DOAC agent for anticoagulation if the bleeding risk is acceptable.   You can also use the SmartPhrase .[email or web address] for documentation.   :[id number]}         Physical Exam:   VS:  There were no vitals taken for this visit.   Wt Readings from Last 3 Encounters:  04/11/24 192 lb 6.4 oz (87.3 kg)  03/08/24 191 lb 12.8 oz (87 kg)  02/16/24 194 lb 3.2 oz (88.1 kg)     GEN: Well nourished, well developed in no acute distress NECK: No JVD; No carotid bruits CARDIAC: {EPRHYTHM:28826}, no murmurs, rubs, gallops RESPIRATORY:  Clear to auscultation without rales, wheezing or  rhonchi  ABDOMEN: Soft, non-tender, non-distended EXTREMITIES:  No edema; No deformity   ASSESSMENT AND PLAN:    1.  Persistent atrial fibrillation/flutter: Post ILR, though the device is at EOS.  He wore a recent monitor that showed a 100% atrial fibrillation/flutter burden.***  2.  Chronic systolic heart failure: Due to ischemic cardiomyopathy.  On medical therapy per heart failure cardiology  3.  Hypertension:***   Follow up with {EPMDS:28135::EP Team} {EPFOLLOW LE:71826}  Signed, Chanice Brenton Gladis Norton, MD

## 2024-05-02 ENCOUNTER — Encounter: Payer: Self-pay | Admitting: Cardiology

## 2024-05-02 ENCOUNTER — Telehealth: Payer: Self-pay | Admitting: Cardiology

## 2024-05-02 ENCOUNTER — Ambulatory Visit: Attending: Cardiology | Admitting: Cardiology

## 2024-05-02 VITALS — BP 115/77 | HR 85 | Ht 71.0 in | Wt 190.0 lb

## 2024-05-02 DIAGNOSIS — D6869 Other thrombophilia: Secondary | ICD-10-CM

## 2024-05-02 DIAGNOSIS — I5022 Chronic systolic (congestive) heart failure: Secondary | ICD-10-CM | POA: Diagnosis not present

## 2024-05-02 DIAGNOSIS — I484 Atypical atrial flutter: Secondary | ICD-10-CM

## 2024-05-02 DIAGNOSIS — I483 Typical atrial flutter: Secondary | ICD-10-CM

## 2024-05-02 DIAGNOSIS — I4819 Other persistent atrial fibrillation: Secondary | ICD-10-CM

## 2024-05-02 MED ORDER — METOPROLOL SUCCINATE ER 100 MG PO TB24: 100.0000 mg | ORAL_TABLET | Freq: Every day | ORAL | Status: DC

## 2024-05-02 MED ORDER — METOPROLOL SUCCINATE ER 100 MG PO TB24: 100.0000 mg | ORAL_TABLET | Freq: Every day | ORAL | 3 refills | Status: DC

## 2024-05-02 NOTE — Telephone Encounter (Signed)
 Pt c/o medication issue:  1. Name of Medication: metoprolol  succinate (TOPROL -XL) 100 MG 24 hr tablet   2. How are you currently taking this medication (dosage and times per day)? Pt was taking 50 mg twice daily before appt  3. Are you having a reaction (difficulty breathing--STAT)? no  4. What is your medication issue? Pt said he was told we were increasing his dosage to to 100 mg per day but he said he was already taking that so he is confused.

## 2024-05-02 NOTE — Telephone Encounter (Signed)
 Pt notified of MD response.

## 2024-05-02 NOTE — Patient Instructions (Signed)
 Medication Instructions:  Your physician has recommended you make the following change in your medication: INCREASE Toprol  to 100 mg daily  *If you need a refill on your cardiac medications before your next appointment, please call your pharmacy*  Lab Work: None ordered   Testing/Procedures: Your physician has requested that you have an echocardiogram in December. Echocardiography is a painless test that uses sound waves to create images of your heart. It provides your doctor with information about the size and shape of your heart and how well your heart's chambers and valves are working. This procedure takes approximately one hour. There are no restrictions for this procedure. Please do NOT wear cologne, perfume, aftershave, or lotions (deodorant is allowed). Please arrive 15 minutes prior to your appointment time.  Please note: We ask at that you not bring children with you during ultrasound (echo/ vascular) testing. Due to room size and safety concerns, children are not allowed in the ultrasound rooms during exams. Our front office staff cannot provide observation of children in our lobby area while testing is being conducted. An adult accompanying a patient to their appointment will only be allowed in the ultrasound room at the discretion of the ultrasound technician under special circumstances. We apologize for any inconvenience.  Follow-Up: At [facility], you and your health needs are our priority.  As part of our continuing mission to provide you with exceptional heart care, our providers are all part of one team.  This team includes your primary Cardiologist (physician) and Advanced Practice Providers or APPs (Physician Assistants and Nurse Practitioners) who all work together to provide you with the care you need, when you need it.  Your next appointment:   January   Provider:   You will follow up in the Atrial Fibrillation Clinic located at [facility]. Your  provider will be: Clint R. Fenton, PA-C or Fairy Heinrich, PA-C     Thank you for choosing [employer]!!   Maeola Domino, RN [phone]

## 2024-05-02 NOTE — Addendum Note (Signed)
 Addended by: GRETEL MAEOLA CROME on: 05/02/2024 12:10 PM   Modules accepted: Orders

## 2024-05-02 NOTE — Telephone Encounter (Signed)
 Spoke to patient he stated he saw Dr.Camnitz today.Stated he was told to increase Metoprolol  to 100 mg daily.Stated when he got home he checked and he is taking Metoprolol  Succ 50 mg twice a day.I will send message to [facility] for advice.

## 2024-05-06 ENCOUNTER — Other Ambulatory Visit (HOSPITAL_COMMUNITY): Payer: Self-pay | Admitting: Family Medicine

## 2024-05-11 ENCOUNTER — Ambulatory Visit: Attending: Cardiology

## 2024-05-11 DIAGNOSIS — I4819 Other persistent atrial fibrillation: Secondary | ICD-10-CM

## 2024-05-11 DIAGNOSIS — I5022 Chronic systolic (congestive) heart failure: Secondary | ICD-10-CM

## 2024-05-11 LAB — ECHOCARDIOGRAM COMPLETE
MV M vel: 2.83 m/s
MV Peak grad: 32 mmHg
P 1/2 time: 554 ms
S' Lateral: 3.7 cm

## 2024-05-14 NOTE — Progress Notes (Unsigned)
 Patient ID: [name], male   DOB: Aug 19, 1950, 73 y.o.   MRN: [id number]  HPI: [name] is a 73 y.o.-year-old male, initially referred by his cardiologist, Dr. Francyne, returning for follow-up DM2, dx in 2011, non-insulin -dependent, uncontrolled, with complications (CHF, CKD, ED, DR).  Last visit 6 months ago.  Interim hx: No increased urination, blurry vision, nausea, chest pain. He continues on a mostly whole food plant-based diet.  He tells me he has 1 main meal a day, and otherwise he has light meals/snacks.  Reviewed HbA1c levels Lab Results  Component Value Date   HGBA1C 6.8 (H) 02/16/2024   HGBA1C 6.5 11/08/2023   HGBA1C 5.7 (A) 05/10/2023   HGBA1C 6.5 01/31/2023   HGBA1C 6.5 01/20/2023   HGBA1C 6.2 09/21/2022   HGBA1C 5.8 (A) 05/04/2022   HGBA1C 5.8 (A) 10/29/2021   HGBA1C 5.9 (A) 06/30/2021   HGBA1C 8.2 (H) 03/06/2021   HGBA1C 7.4 (A) 02/24/2021   HGBA1C 6.9 (A) 10/21/2020   HGBA1C 6.3 (A) 07/22/2020   HGBA1C 8.1 (A) 04/18/2020   HGBA1C 6.6 (A) 12/14/2019   HGBA1C 5.3 09/14/2019   HGBA1C 6.4 06/11/2019   HGBA1C 5.8 02/06/2019   HGBA1C 5.7 08/03/2018   HGBA1C 7.0 (H) 05/02/2018   HGBA1C 6.7 (H) 01/24/2018   HGBA1C 6.2 10/21/2017   HGBA1C 7.2 (H) 03/18/2017   HGBA1C 7.0 (H) 11/15/2016   HGBA1C 7.5 (H) 08/20/2016   HGBA1C 6.8 (H) 05/21/2016   HGBA1C 6.7 (H) 02/18/2016   HGBA1C 6.4 11/17/2015   HGBA1C 6.4 07/15/2015   HGBA1C 6.6 (H) 04/14/2015   Pt is on a regimen of: - Farxiga  10 mg daily-started by Dr. Bensimhon - Ozempic  0.25 >> 0.5 mg weekly - started 03/29/2021 - through [facility] Stopped Glyburide  02/2021.  He was not able to start Ozempic  0.5 mg- 04/2020 - due to price. He was on Metformin  in the past.  Pt checks his sugars once a day: - am: 116, 123 >> 106-130, 137, 152 >> 107-137 - 2h after b'fast: n/c  - before lunch:  99-126 >> 101-140 >> n/c > 102 - 2h after lunch: 224>> 93-124  >> n/c >> 146 >> n/c - before dinner: 191-113 >> n/c >> 99  >> 105. - 2h after dinner: n/c >> 107-161 >> 94 >> n/c - bedtime: 105-119 >> 97-152 >> 112 >> 145 - nighttime: n/c >> 128 >> n/c Lowest sugar was 60 >> ...  92 >> 106; he has hypoglycemia awareness in the 50s.  Highest sugar was 376 (cheese cake) >>...   129 >> 152 >> 145  Glucometer: One Touch verio IQ  Pt's meals are: - Breakfast: oatmeal + berries/apples + cinnamon, cereal, fruit - Lunch: light meal - Dinner:  + veggies, occasionally fried foods (1x a mo) - Snacks:juice >> fruit  -+ CKD-sees nephrology: Dr. Douglas, last BUN/creatinine:  Lab Results  Component Value Date   BUN 29 (H) 03/28/2024   BUN 32 (H) 02/16/2024   CREATININE 1.88 (H) 03/28/2024   CREATININE 1.86 (H) 02/16/2024   Lab Results  Component Value Date   GFRAA 42 (L) 05/20/2020   GFRAA 38 (L) 02/25/2020   GFRAA 36 (L) 11/26/2019   GFRAA 39 (L) 11/12/2019   GFRAA 36 (L) 09/18/2019   GFRAA 37 (L) 08/30/2019   GFRAA 39 (L) 08/14/2019   GFRAA 45 (L) 07/11/2019   GFRAA 51 (L) 05/29/2019   GFRAA 58 (L) 05/28/2019  He is not on ACE inhibitor/ARB.  -+ HL; last set of  lipids: Lab Results  Component Value Date   CHOL 142 02/16/2024   HDL 38.90 (L) 02/16/2024   LDLCALC 85 02/16/2024   LDLDIRECT 84.0 05/02/2018   TRIG 92.0 02/16/2024   CHOLHDL 4 02/16/2024  On pravastatin  40, omega-3 fatty acids .  - last eye exam was 03/2023: + DR reportedly. Also glaucoma. Dr. Maree.  -+ numbness and tingling in his feet.Dr. Francyne started him on Gabapentin  100 mg daily.  Currently on 100-200 mg daily.  Last foot exam 02/28/2024 (Dr. Gaynel)  Pt has FH of DM in mother, sister.  He is on amiodarone .  No FH of MTC. No personal hx of pancreatitis.  ROS: + see HPI  I reviewed pt's medications, allergies, PMH, social hx, family hx, and changes were documented in the history of present illness. Otherwise, unchanged from my initial visit note.  Past Medical History:  Diagnosis Date   AORTIC STENOSIS, MODERATE  12/05/2009   Arthritis    Atrial fibrillation (HCC)    Atrial fibrillation with RVR (HCC) 07/05/2017   Atrial flutter (HCC) 12/05/2009   Atypical chest pain 11/08/2011   BENIGN PROSTATIC HYPERTROPHY, HX OF 12/05/2009   CHEST PAIN-UNSPECIFIED 11/11/2009   CHF 12/05/2009   Chronic renal insufficiency 12/16/2016   Debility 04/18/2012   Diarrhea 12/16/2016   DYSPNEA 12/19/2009   Edema 04/18/2012   ERECTILE DYSFUNCTION 01/25/2007   FATIGUE, CHRONIC 11/11/2009   Gout 07/10/2012   Heart murmur    History of kidney stones    they passed (07/05/2017)   Hyperkalemia 04/18/2012   Hyperlipidemia    Hypertension    HYPERTENSION 01/25/2007   Hypoxia 05/05/2011   Insomnia 05/13/2016   INSOMNIA, HX OF 01/25/2007   Knee pain, bilateral 12/28/2010   Knee pain, right 12/28/2010   NEUROMA 01/05/2010   OBESITY NOS 01/25/2007   OSA on CPAP 04/06/2010   PERIPHERAL NEUROPATHY 01/05/2010   PULMONARY FUNCTION TESTS, ABNORMAL 02/02/2010   Sleep apnea    wears CPAP   Superficial thrombophlebitis of left leg 09/07/2011   TESTICULAR HYPOFUNCTION 01/05/2010   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   Toe pain, right 07/10/2012   Type II diabetes mellitus (HCC) 12/05/2009   UNSPECIFIED ANEMIA 01/05/2010   Past Surgical History:  Procedure Laterality Date   A-FLUTTER ABLATION N/A 05/16/2019   Procedure: A-FLUTTER ABLATION;  Surgeon: Inocencio Soyla Lunger, MD;  Location: [facility];  Service: Cardiovascular;  Laterality: N/A;   ATRIAL FIBRILLATION ABLATION N/A 05/16/2019   Procedure: ATRIAL FIBRILLATION ABLATION;  Surgeon: Inocencio Soyla Lunger, MD;  Location: [facility];  Service: Cardiovascular;  Laterality: N/A;   ATRIAL FIBRILLATION ABLATION N/A 03/10/2020   Procedure: ATRIAL FIBRILLATION ABLATION;  Surgeon: Inocencio Soyla Lunger, MD;  Location: [facility];  Service: Cardiovascular;  Laterality: N/A;   CARDIAC CATHETERIZATION  10/16/2009   nonischemic cardiomyopathy   CARDIOVERSION  N/A 03/25/2015   Procedure: CARDIOVERSION;  Surgeon: Vinie JAYSON Maxcy, MD;  Location: [facility] ENDOSCOPY;  Service: Cardiovascular;  Laterality: N/A;   CARDIOVERSION N/A 06/17/2017   Procedure: CARDIOVERSION;  Surgeon: Francyne Headland, MD;  Location: [facility] ENDOSCOPY;  Service: Cardiovascular;  Laterality: N/A;   CARDIOVERSION N/A 07/08/2017   Procedure: CARDIOVERSION;  Surgeon: Okey Vina GAILS, MD;  Location: [facility] ENDOSCOPY;  Service: Cardiovascular;  Laterality: N/A;   CARDIOVERSION N/A 02/27/2019   Procedure: CARDIOVERSION;  Surgeon: Francyne Headland, MD;  Location: [facility] ENDOSCOPY;  Service: Cardiovascular;  Laterality: N/A;   CARDIOVERSION N/A 08/16/2019   Procedure: CARDIOVERSION;  Surgeon: Francyne Headland, MD;  Location: [facility]  ENDOSCOPY;  Service: Cardiovascular;  Laterality: N/A;   COLONOSCOPY  03/10/2021   1st colonoscopy , [facility], large transverse polyp, clip and spot applied   METATARSAL OSTEOTOMY Bilateral ~ 1980   removed part of 5th metatarsal to corect curvature of toes    RIGHT HEART CATH N/A 05/16/2019   Procedure: RIGHT HEART CATH;  Surgeon: Cherrie Toribio SAUNDERS, MD;  Location: [facility];  Service: Cardiovascular;  Laterality: N/A;   TEE WITHOUT CARDIOVERSION  05/16/2019   Procedure: Transesophageal Echocardiogram (Tee);  Surgeon: Inocencio Soyla Lunger, MD;  Location: [facility] INVASIVE CV LAB;  Service: Cardiovascular;;   TEE WITHOUT CARDIOVERSION N/A 03/07/2020   Procedure: TRANSESOPHAGEAL ECHOCARDIOGRAM (TEE);  Surgeon: Hobart Powell BRAVO, MD;  Location: [facility] ENDOSCOPY;  Service: Cardiovascular;  Laterality: N/A;   Social History   Socioeconomic History   Marital status: Married    Spouse name: Not on file   Number of children: 4   Years of education: 12   Highest education level: High school graduate  Occupational History   Occupation: retired    Associate Professor: [employer]  Tobacco Use   Smoking status: Former    Current packs/day: 0.00    Average packs/day: 1 pack/day for 10.0  years (10.0 ttl pk-yrs)    Types: Cigarettes    Start date: 06/07/1970    Quit date: 06/07/1980    Years since quitting: 43.9   Smokeless tobacco: Never  Vaping Use   Vaping status: Never Used  Substance and Sexual Activity   Alcohol  use: No   Drug use: No   Sexual activity: Not Currently  Other Topics Concern   Not on file  Social History Narrative   Lives with male partner in a one story home.  His son lives there off and on.  Retired from [employer].  Education: high school.  Right handed   Social Drivers of Health   Financial Resource Strain: Low Risk  (03/08/2024)   Overall Financial Resource Strain (CARDIA)    Difficulty of Paying Living Expenses: Not hard at all  Food Insecurity: No Food Insecurity (03/08/2024)   Hunger Vital Sign    Worried About Running Out of Food in the Last Year: Never true    Ran Out of Food in the Last Year: Never true  Transportation Needs: No Transportation Needs (03/08/2024)   PRAPARE - Administrator, Civil Service (Medical): No    Lack of Transportation (Non-Medical): No  Physical Activity: Sufficiently Active (03/08/2024)   Exercise Vital Sign    Days of Exercise per Week: 7 days    Minutes of Exercise per Session: 60 min  Stress: No Stress Concern Present (03/08/2024)   [employer] of Occupational Health - Occupational Stress Questionnaire    Feeling of Stress: Not at all  Social Connections: Moderately Isolated (03/08/2024)   Social Connection and Isolation Panel    Frequency of Communication with Friends and Family: More than three times a week    Frequency of Social Gatherings with Friends and Family: More than three times a week    Attends Religious Services: Never    Database Administrator or Organizations: No    Attends Banker Meetings: Never    Marital Status: Married  Catering Manager Violence: Not At Risk (03/08/2024)   Humiliation, Afraid, Rape, and Kick questionnaire    Fear of Current or Ex-Partner:  No    Emotionally Abused: No    Physically Abused: No    Sexually  Abused: No   Current Outpatient Medications on File Prior to Visit  Medication Sig Dispense Refill   Accu-Chek Softclix Lancets lancets Use as instructed 200 each 3   albuterol  (VENTOLIN  HFA) 108 (90 Base) MCG/ACT inhaler Inhale 2 puffs into the lungs every 6 (six) hours as needed for wheezing or shortness of breath. 1 Inhaler 6   Alcohol  Swabs  (DROPSAFE ALCOHOL  PREP) 70 % PADS USE AS DIRECTED 300 each 3   allopurinol  (ZYLOPRIM ) 100 MG tablet Take 1 tablet by mouth once daily 90 tablet 0   apixaban  (ELIQUIS ) 5 MG TABS tablet Take 1 tablet by mouth twice daily 60 tablet 11   Blood Glucose Monitoring Suppl (ACCU-CHEK GUIDE ME) w/Device KIT USE TO TEST BLOOD SUGAR ONCE TO TWICE DAILY 1 kit 0   calcium  carbonate (TUMS - DOSED IN MG ELEMENTAL CALCIUM ) 500 MG chewable tablet Chew 500 mg by mouth daily as needed for indigestion or heartburn.     cetirizine  (ZYRTEC ) 10 MG tablet Take 1 tablet (10 mg total) by mouth daily. 30 tablet 11   Clobetasol  Prop Emollient Base (CLOBETASOL  PROPIONATE E) 0.05 % emollient cream Apply 1 application topically 2 (two) times daily as needed (insect bite). 60 g 1   colchicine  (COLCRYS ) 0.6 MG tablet Take one tablet for 4-6 days as needed for gout flare up. 15 tablet 3   dapagliflozin  propanediol (FARXIGA ) 10 MG TABS tablet Take 1 tablet (10 mg total) by mouth daily. 90 tablet 3   fish oil-omega-3 fatty acids  1000 MG capsule Take 1 g by mouth daily.     furosemide  (LASIX ) 80 MG tablet TAKE 1 TABLET BY MOUTH ONCE DAILY AS NEEDED. MAY TAKE AN ADDITIONAL 40MG  (1/2 TABLET) AS DIRECTED PER ALLEVIATE RESEARCH STUDY PRN PLAN. 135 tablet 3   gabapentin  (NEURONTIN ) 100 MG capsule Take 2 capsules (200 mg total) by mouth at bedtime. 180 capsule 3   glucose blood (ACCU-CHEK GUIDE TEST) test strip USE TO TEST BLOOD SUGAR ONCE TO TWICE DAILY 200 each 3   hydrALAZINE  (APRESOLINE ) 10 MG tablet TAKE 1 TABLET BY MOUTH THREE  TIMES DAILY 270 tablet 0   HYDROcodone -acetaminophen  (NORCO) 10-325 MG tablet Take 1 tablet by mouth every 8 (eight) hours as needed for moderate pain or severe pain. 90 tablet 0   hydrocortisone  2.5 % ointment Apply topically 2 (two) times daily as needed. 30 g 1   isosorbide  mononitrate (IMDUR ) 30 MG 24 hr tablet Take 1 tablet by mouth once daily 90 tablet 0   LORazepam  (ATIVAN ) 1 MG tablet TAKE 1 TABLET BY MOUTH EVERY 8 HOURS AS NEEDED FOR ANXIETY 45 tablet 0   losartan  (COZAAR ) 25 MG tablet Take 1/2 (one-half) tablet by mouth once daily 45 tablet 3   magnesium  oxide (MAG-OX) 400 MG tablet Take 1 tablet (400 mg total) by mouth 2 (two) times daily. 30 tablet 11   metoprolol  succinate (TOPROL -XL) 100 MG 24 hr tablet Take 1 tablet (100 mg total) by mouth daily. Take with or immediately following a meal. 60 tablet 3   Multiple Vitamin (MULTIVITAMIN WITH MINERALS) TABS tablet Take 1 tablet by mouth daily.      potassium chloride  SA (KLOR-CON  M) 20 MEQ tablet TAKE 1 TABLET BY MOUTH ONCE DAILY AS DIRECTED PER  ALLEVIATE  RESEARCH  PROTOCOL 90 tablet 0   pravastatin  (PRAVACHOL ) 40 MG tablet Take 1 tablet by mouth once daily 90 tablet 3   Semaglutide ,0.25 or 0.5MG /DOS, 2 MG/3ML SOPN Inject 0.5 mg into the skin once a  week.     tamsulosin  (FLOMAX ) 0.4 MG CAPS capsule Take 1 capsule by mouth once daily 30 capsule 0   temazepam  (RESTORIL ) 30 MG capsule Take 1 capsule by mouth at bedtime 30 capsule 0   tiZANidine  (ZANAFLEX ) 2 MG tablet Take 0.5-1 tablets (1-2 mg total) by mouth at bedtime as needed for muscle spasms. 30 tablet 1   VITAMIN D PO Take by mouth.     No current facility-administered medications on file prior to visit.   Allergies  Allergen Reactions   Sulfonamide Derivatives Other (See Comments)    Flu like symptoms    Zolpidem  Other (See Comments)    Excessive, prolonged sedation   Family History  Problem Relation Age of Onset   Clotting disorder Mother    Heart disease Mother         s/p MI   Heart attack Mother    Hypertension Mother    Diabetes Mother    Hyperlipidemia Mother    Stroke Mother    Cancer Father        ? lung   Lung disease Father        smoker   Diabetes Sister    Hypertension Sister        smoker   Aneurysm Sister        brain   Other Sister        clipped   Seizures Sister        d/o w/aneurysm/ smoker   Leukemia Maternal Grandmother        ?   Colon cancer Neg Hx    Colon polyps Neg Hx    Stomach cancer Neg Hx    Rectal cancer Neg Hx    Esophageal cancer Neg Hx    PE: There were no vitals taken for this visit. Wt Readings from Last 10 Encounters:  05/02/24 190 lb (86.2 kg)  04/11/24 192 lb 6.4 oz (87.3 kg)  03/08/24 191 lb 12.8 oz (87 kg)  02/16/24 194 lb 3.2 oz (88.1 kg)  11/11/23 194 lb 3.2 oz (88.1 kg)  11/07/23 194 lb 6.4 oz (88.2 kg)  10/13/23 192 lb 3.2 oz (87.2 kg)  06/27/23 191 lb 6.4 oz (86.8 kg)  06/20/23 191 lb 3.2 oz (86.7 kg)  06/15/23 191 lb 3.2 oz (86.7 kg)   Constitutional: overweight, in NAD Eyes:  EOMI, no exophthalmos ENT: no neck masses, no cervical lymphadenopathy Cardiovascular: RRR, No MRG Respiratory: CTA B Musculoskeletal: no deformities Skin:no rashes Neurological: no tremor with outstretched hands  ASSESSMENT: 1. DM2, non-insulin -dependent, uncontrolled, with long-term complications - s and d CHF - A fib - s/p cardioversion - 05/2019 >> went into cardiac shock - CKD - sees nephrology - DR - ED  2.  Overweight  3. HL  4.  Peripheral neuropathy  PLAN:  1. Patient with longstanding, previously uncontrolled type 2 diabetes, with significant improvement in blood sugars on the regimen containing SGLT2 inhibitor and weekly GLP-1 receptor agonist.  He obtains Farxiga  and Ozempic  through the patient assistance program.  He continues to stay active and is mostly on a whole food plant-based diet with intermittent fasting.  At last visit, HbA1c was slightly higher, at 6.5% but since then he had  another HbA1c which was even higher, at 6.8% (although still at goal) on 02/16/2024.   - I suggested to:  Patient Instructions  Please continue: - Farxiga  10 mg daily - Ozempic  0.5 mg weekly  Please come back for a follow-up appointment in  6 months.  - we checked his HbA1c: 7%  - advised to check sugars at different times of the day - 1x a day, rotating check times - advised for yearly eye exams >> he is UTD - return to clinic in 6 months  2.  Overweight - will continue the SGLT2 inhibitor and GLP-1 receptor agonist, which should also help with weight loss - Gained 9 pounds before the last 2 visits combined - he lost 4 lbs since then  3. HL - Latest lipid panel was reviewed from 02/2024: LDL above our target of less than 55, HDL slightly low, triglycerides at goal: Lab Results  Component Value Date   CHOL 142 02/16/2024   HDL 38.90 (L) 02/16/2024   LDLCALC 85 02/16/2024   LDLDIRECT 84.0 05/02/2018   TRIG 92.0 02/16/2024   CHOLHDL 4 02/16/2024  - He continues on pravastatin  40 mg daily and omega-3 fatty acids  without side effects  4.  Peripheral neuropathy - Most likely related to diabetes - On gabapentin  100 to 200 mg at bedtime, initially added by Dr. Francyne - He continues to have numbness and tingling - He follows up with podiatry-last exam 08/2023. - He uses diabetic shoes  Lela Fendt, MD PhD [facility]

## 2024-05-15 ENCOUNTER — Encounter: Payer: Self-pay | Admitting: Internal Medicine

## 2024-05-15 ENCOUNTER — Ambulatory Visit: Admitting: Internal Medicine

## 2024-05-15 VITALS — BP 112/60 | HR 71 | Ht 71.0 in | Wt 192.0 lb

## 2024-05-15 DIAGNOSIS — E663 Overweight: Secondary | ICD-10-CM

## 2024-05-15 DIAGNOSIS — Z7985 Long-term (current) use of injectable non-insulin antidiabetic drugs: Secondary | ICD-10-CM | POA: Diagnosis not present

## 2024-05-15 DIAGNOSIS — Z7984 Long term (current) use of oral hypoglycemic drugs: Secondary | ICD-10-CM | POA: Diagnosis not present

## 2024-05-15 DIAGNOSIS — E782 Mixed hyperlipidemia: Secondary | ICD-10-CM

## 2024-05-15 DIAGNOSIS — E1159 Type 2 diabetes mellitus with other circulatory complications: Secondary | ICD-10-CM | POA: Diagnosis not present

## 2024-05-15 LAB — POCT GLYCOSYLATED HEMOGLOBIN (HGB A1C): Hemoglobin A1C: 6.1 % — AB (ref 4.0–5.6)

## 2024-05-15 NOTE — Patient Instructions (Signed)
Please continue: - Farxiga 10 mg daily - Ozempic 0.5 mg weekly  Please come back for a follow-up appointment in 6 months.

## 2024-05-16 ENCOUNTER — Ambulatory Visit: Payer: Self-pay | Admitting: Cardiology

## 2024-05-17 ENCOUNTER — Telehealth: Payer: Self-pay

## 2024-05-17 NOTE — Telephone Encounter (Signed)
 Copied from CRM #[id number]. Topic: General - Other >> May 17, 2024 10:28 AM Russell PARAS wrote: Reason for CRM:   Pt is returning call from [facility] concerning his BiPAP machine.  He confirms he is using his BiPAP machine daily   CB#  [phone]    Called and spoke to pt. Pt states he will bring BiPAP to 12/15 appt with Tammy Parrett. NFN

## 2024-05-17 NOTE — Telephone Encounter (Signed)
 ATC pt LVMTCB to ask if pt is still using his BiPAP machine, as [employer] has not been updated since November.   Also sent MyChart message.

## 2024-05-21 ENCOUNTER — Ambulatory Visit: Admitting: Adult Health

## 2024-05-24 ENCOUNTER — Ambulatory Visit: Admitting: Adult Health

## 2024-05-24 ENCOUNTER — Encounter: Payer: Self-pay | Admitting: Adult Health

## 2024-05-24 ENCOUNTER — Ambulatory Visit: Admitting: Pulmonary Disease

## 2024-05-24 VITALS — BP 110/75 | HR 66 | Temp 97.6°F | Ht 71.0 in | Wt 190.8 lb

## 2024-05-24 DIAGNOSIS — G4700 Insomnia, unspecified: Secondary | ICD-10-CM

## 2024-05-24 DIAGNOSIS — G4733 Obstructive sleep apnea (adult) (pediatric): Secondary | ICD-10-CM | POA: Diagnosis not present

## 2024-05-24 DIAGNOSIS — I509 Heart failure, unspecified: Secondary | ICD-10-CM | POA: Diagnosis not present

## 2024-05-24 DIAGNOSIS — I429 Cardiomyopathy, unspecified: Secondary | ICD-10-CM

## 2024-05-24 NOTE — Patient Instructions (Addendum)
 Continue on BIPAP At bedtime. Keep up good work.  Change BIPAP pressure Download in 6 weeks  Goal is to wear for at least 6 -8 hrs each night.  Do not drive if sleepy.  Work on healthy weight.  Healthy sleep regimen .  Follow up with Dr. Jude or Jotham Ahn in 6 months and As needed

## 2024-05-24 NOTE — Progress Notes (Signed)
 @Patient  ID: [name], male    DOB: 1950-08-03, 73 y.o.   MRN: [id number]  Chief Complaint  Patient presents with   Obstructive Sleep Apnea    F/u    Referring provider: Domenica Harlene LABOR, MD  HPI: 73 year old male former smoker followed for obstructive sleep apnea and insomnia Medical history significant for nonischemic cardiomyopathy, atrial flutter, congestive heart failure, history of cardioversion and ablation    TEST/EVENTS : Reviewed 05/24/2024  2D echo July 20, 2019 EF 35 to 40%   PSG dec '11>> - weighed 225 pounds,BMI of 31, neck size of 14 inches Sleep was very fragmented due to frequent arousals and awakenings, especially in the second half of the night with CPAP. He was desensitized with a standard nasal mask. Note that he took 2 tablets of temazepam  of 15 mg prior to the study. >>AHI was 42/h with lowest desatn of 86 % (no centrals noted on original study  >>corrected by CPAP of 8 cm with a nasal mask. Download 12/28 -07/02/10 >> good compliance avg 6.5h, AHI 15/h, centrals 9/h, obstructive 4.5/h, avg pr 8 cm Download on 10 cm 2/28-3/28/12 >> few residual events, some central apneas persist   PFT -> 01/2010 - TLC 77%, DLCO 56%, FVC 3.5L/72%, RAtio 77 ,FEV 1 78%    Discussed the use of AI scribe software for clinical note transcription with the patient, who gave verbal consent to proceed.  History of Present Illness [name] is a 73 year old male with sleep apnea who presents for a checkup regarding his BiPAP therapy.  He uses his BiPAP machine with a full face mask every night and feels good overall. However, There have been no recent changes in medications except for an increase in metoprolol  from 100 mg to 200 mg daily. He continues to take temazepam  at bedtime for insomnia.  CPAP download shows excellent compliance with the 100% usage.  Daily average usage at 8.5 hours.  He is on auto BiPAP IPAP max 16 EPAP minimum 12 AHI 16.5/hour.  Predominantly  central episodes  He mentions experiencing atrial flutter since the end of September, identified due to fluctuations in his pulse rate, typically around 69 bpm, but noted increases to 89-99 bpm. He has a history of congestive heart failure and cardiomyopathy, with previous echocardiograms showing varying ejection fractions: 35-40% in 2021, 45-50% last year, and 50-55% in February.  Most recent echo earlier this month showed decrease in EF at 35 to 40%.  He has upcoming appointment in 2 weeks with cardiology.   He reports sleeping well, getting eight hours of sleep each night, and maintains a healthy diet. He monitors his vital signs daily and is proactive in managing his health. He requests supplies for his BiPAP machine. No extra shortness of breath.  No increased leg swelling.  No orthopnea.     Allergies[1]  Immunization History  Administered Date(s) Administered   Fluad Quad(high Dose 65+) 05/22/2020   Fluad Trivalent(High Dose 65+) 06/27/2023   INFLUENZA, HIGH DOSE SEASONAL PF 02/16/2024   Influenza Split 03/03/2011, 03/10/2012, 04/07/2018   Influenza Whole 03/28/2008, 03/30/2010   Influenza, Seasonal, Injecte, Preservative Fre 01/31/2013   Influenza,inj,Quad PF,6+ Mos 01/31/2013, 03/07/2014, 04/14/2015, 02/19/2016, 03/18/2017   Influenza-Unspecified 04/05/2019, 05/22/2020, 05/15/2021   PFIZER Comirnaty(Gray Top)Covid-19 Tri-Sucrose Vaccine 09/18/2020   PFIZER(Purple Top)SARS-COV-2 Vaccination 07/31/2019, 08/03/2019, 08/21/2019, 04/18/2020, 09/18/2020   PNEUMOCOCCAL CONJUGATE-20 07/07/2021   Pfizer Covid-19 Vaccine Bivalent Booster 42yrs & up 05/20/2021   Pneumococcal Polysaccharide-23 10/07/2009   Td  09/05/2009    Past Medical History:  Diagnosis Date   AORTIC STENOSIS, MODERATE 12/05/2009   Arthritis    Atrial fibrillation (HCC)    Atrial fibrillation with RVR (HCC) 07/05/2017   Atrial flutter (HCC) 12/05/2009   Atypical chest pain 11/08/2011   BENIGN PROSTATIC  HYPERTROPHY, HX OF 12/05/2009   CHEST PAIN-UNSPECIFIED 11/11/2009   CHF 12/05/2009   Chronic renal insufficiency 12/16/2016   Debility 04/18/2012   Diarrhea 12/16/2016   DYSPNEA 12/19/2009   Edema 04/18/2012   ERECTILE DYSFUNCTION 01/25/2007   FATIGUE, CHRONIC 11/11/2009   Gout 07/10/2012   Heart murmur    History of kidney stones    they passed (07/05/2017)   Hyperkalemia 04/18/2012   Hyperlipidemia    Hypertension    HYPERTENSION 01/25/2007   Hypoxia 05/05/2011   Insomnia 05/13/2016   INSOMNIA, HX OF 01/25/2007   Knee pain, bilateral 12/28/2010   Knee pain, right 12/28/2010   NEUROMA 01/05/2010   OBESITY NOS 01/25/2007   OSA on CPAP 04/06/2010   PERIPHERAL NEUROPATHY 01/05/2010   PULMONARY FUNCTION TESTS, ABNORMAL 02/02/2010   Sleep apnea    wears CPAP   Superficial thrombophlebitis of left leg 09/07/2011   TESTICULAR HYPOFUNCTION 01/05/2010   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 1 Inhaler 6   Alcohol  Swabs  (DROPSAFE ALCOHOL  PREP) 70 % PADS USE AS DIRECTED 300 each 3   allopurinol  (ZYLOPRIM ) 100 MG tablet Take 1 tablet by mouth once daily 90 tablet 0   apixaban  (ELIQUIS ) 5 MG TABS tablet Take 1 tablet by mouth twice daily 60 tablet 11   Blood Glucose Monitoring Suppl (ACCU-CHEK GUIDE ME) w/Device KIT USE TO TEST BLOOD SUGAR ONCE TO TWICE DAILY 1 kit 0   calcium  carbonate (TUMS - DOSED IN MG ELEMENTAL CALCIUM ) 500 MG chewable tablet Chew 500 mg by mouth daily as needed for indigestion or heartburn.      cetirizine  (ZYRTEC ) 10 MG tablet Take 1 tablet (10 mg total) by mouth daily. 30 tablet 11   Clobetasol  Prop Emollient Base (CLOBETASOL  PROPIONATE E) 0.05 % emollient cream Apply 1 application topically 2 (two) times daily as needed (insect bite). 60 g 1   colchicine  (COLCRYS ) 0.6 MG tablet Take one tablet for 4-6 days as needed for gout flare up. 15 tablet 3   dapagliflozin  propanediol (FARXIGA ) 10 MG TABS tablet Take 1 tablet (10 mg total) by mouth daily. 90 tablet 3   fish oil-omega-3 fatty acids  1000 MG capsule Take 1 g by mouth daily.     furosemide  (LASIX ) 80 MG tablet TAKE 1 TABLET BY MOUTH ONCE DAILY AS NEEDED. MAY TAKE AN ADDITIONAL 40MG  (1/2 TABLET) AS DIRECTED PER ALLEVIATE RESEARCH STUDY PRN PLAN. 135 tablet 3   gabapentin  (NEURONTIN ) 100 MG capsule Take 2 capsules (200 mg total) by mouth at bedtime. 180 capsule 3   glucose blood (ACCU-CHEK GUIDE TEST) test strip USE TO TEST BLOOD SUGAR ONCE TO TWICE DAILY 200 each 3   hydrALAZINE  (APRESOLINE ) 10 MG tablet TAKE 1 TABLET BY MOUTH THREE TIMES DAILY  270 tablet 0   HYDROcodone -acetaminophen  (NORCO) 10-325 MG tablet Take 1 tablet by mouth every 8 (eight) hours as needed for moderate pain or severe pain. 90 tablet 0   hydrocortisone  2.5 % ointment Apply topically 2 (two) times daily as needed. 30 g 1   isosorbide  mononitrate (IMDUR ) 30 MG 24 hr tablet Take 1 tablet by mouth once daily 90 tablet 0   LORazepam  (ATIVAN ) 1 MG tablet TAKE 1 TABLET BY MOUTH EVERY 8 HOURS AS NEEDED FOR ANXIETY 45 tablet 0   losartan  (COZAAR ) 25 MG tablet Take 1/2 (one-half) tablet by mouth once daily 45 tablet 3   magnesium  oxide (MAG-OX) 400 MG tablet Take 1 tablet (400 mg total) by mouth 2 (two) times daily. 30 tablet 11   metoprolol  succinate (TOPROL -XL) 100 MG 24 hr tablet Take 1 tablet (100 mg total) by mouth daily. Take with or immediately following a meal. (Patient taking differently: Take 100 mg by mouth in the morning and at bedtime. Take 1 tablet by mouth BID)  60 tablet 3   Multiple Vitamin (MULTIVITAMIN WITH MINERALS) TABS tablet Take 1 tablet by mouth daily.      potassium chloride  SA (KLOR-CON  M) 20 MEQ tablet TAKE 1 TABLET BY MOUTH ONCE DAILY AS DIRECTED PER  ALLEVIATE  RESEARCH  PROTOCOL 90 tablet 0   pravastatin  (PRAVACHOL ) 40 MG tablet Take 1 tablet by mouth once daily 90 tablet 3   Semaglutide ,0.25 or 0.5MG /DOS, 2 MG/3ML SOPN Inject 0.5 mg into the skin once a week.     tamsulosin  (FLOMAX ) 0.4 MG CAPS capsule Take 1 capsule by mouth once daily 30 capsule 0   temazepam  (RESTORIL ) 30 MG capsule Take 1 capsule by mouth at bedtime 30 capsule 0   tiZANidine  (ZANAFLEX ) 2 MG tablet Take 0.5-1 tablets (1-2 mg total) by mouth at bedtime as needed for muscle spasms. 30 tablet 1   VITAMIN D PO Take by mouth.     No facility-administered medications prior to visit.     Review of Systems:   Constitutional:   No  weight loss, night sweats,  Fevers, chills, +fatigue, or  lassitude.  HEENT:   No headaches,  Difficulty swallowing,  Tooth/dental problems, or  Sore throat,                No sneezing, itching, ear ache, nasal congestion, post nasal drip,   CV:  No chest pain,  Orthopnea, PND, swelling in lower extremities, anasarca, dizziness, palpitations, syncope.   GI  No heartburn, indigestion, abdominal pain, nausea, vomiting, diarrhea, change in bowel habits, loss of appetite, bloody stools.   Resp: No shortness of breath with exertion or at rest.  No excess mucus, no productive cough,  No non-productive cough,  No coughing up of blood.  No change in color of mucus.  No wheezing.  No chest wall deformity  Skin: no rash or lesions.  GU: no dysuria, change in color of urine, no urgency or frequency.  No flank pain, no hematuria   MS:  No joint pain or swelling.  No decreased range of motion.  No back pain.    Physical Exam  BP 110/75   Pulse 66   Temp 97.6 F (36.4 C)   Ht 5' 11 (1.803 m) Comment: Per pt  Wt 190 lb 12.8 oz (86.5 kg)    SpO2 96% Comment: RA  BMI 26.61 kg/m   GEN: A/Ox3; pleasant , NAD, well nourished    HEENT:  [address]/AT,  NOSE-clear, THROAT-clear,  no lesions, no postnasal drip or exudate noted.   NECK:  Supple w/ fair ROM; no JVD; normal carotid impulses w/o bruits; no thyromegaly or nodules palpated; no lymphadenopathy.    RESP  Clear  P & A; w/o, wheezes/ rales/ or rhonchi. no accessory muscle use, no dullness to percussion  CARD:  irregular , no peripheral edema, pulses intact, no cyanosis or clubbing.  GI:   Soft & nt; nml bowel sounds; no organomegaly or masses detected.   Musco: Warm bil, no deformities or joint swelling noted.   Neuro: alert, no focal deficits noted.    Skin: Warm, no lesions or rashes    Lab Results:Reviewed 05/24/2024   CBC  BMET  Imaging: ECHOCARDIOGRAM COMPLETE Result Date: 05/11/2024    ECHOCARDIOGRAM REPORT   Patient Name:   [name] Date of Exam: 05/11/2024 Medical Rec #:  [id number]        Height:       71.0 in Accession #:    [id number]       Weight:       190.0 lb Date of Birth:  December 05, 1950         BSA:          2.063 m Patient Age:    73 years         BP:           115/77 mmHg Patient Gender: M                HR:           70 bpm. Exam Location:  [facility] Procedure: 2D Echo, 3D Echo, Cardiac Doppler and Color Doppler (Both Spectral            and Color Flow Doppler were utilized during procedure). Indications:    I48.91* Unspeicified atrial fibrillation  History:        Patient has prior history of Echocardiogram examinations. CHF,                 Aortic Valve Disease, Arrythmias:Atrial Fibrillation,                 Signs/Symptoms:Chest Pain; Risk Factors:Hypertension.  Sonographer:    Marshall Benders Referring Phys: [id number] WILL MARTIN CAMNITZ IMPRESSIONS  1. Left ventricular ejection fraction, by estimation, is 35 to 40%. The left ventricle has moderately decreased function. The left ventricle demonstrates global hypokinesis. There is mild left ventricular  hypertrophy. Left ventricular diastolic parameters are indeterminate.  2. Right ventricular systolic function is low normal. The right ventricular size is normal.  3. Left atrial size was severely dilated.  4. The mitral valve is normal in structure. Mild mitral valve regurgitation.  5. The aortic valve is tricuspid. Aortic valve regurgitation is moderate.  6. Aortic dilatation noted. There is mild dilatation of the aortic root, measuring 42 mm.  7. The inferior vena cava is normal in size with greater than 50% respiratory variability, suggesting right atrial pressure of 3 mmHg. Comparison(s): 07/18/2023 EF 50-55%. FINDINGS  Left Ventricle: Left ventricular ejection fraction, by estimation, is 35 to 40%. The left ventricle has moderately decreased function. The left ventricle demonstrates global hypokinesis. The left ventricular internal cavity size was normal in size. There is mild left ventricular hypertrophy. Left ventricular diastolic parameters are indeterminate. Right Ventricle: The right ventricular size is normal. No increase in right ventricular wall thickness. Right ventricular systolic function is low normal. Left Atrium: Left atrial size was severely dilated. Right Atrium: Right atrial size  was normal in size. Pericardium: There is no evidence of pericardial effusion. Mitral Valve: The mitral valve is normal in structure. Mild mitral valve regurgitation. Tricuspid Valve: The tricuspid valve is normal in structure. Tricuspid valve regurgitation is mild. Aortic Valve: The aortic valve is tricuspid. Aortic valve regurgitation is moderate. Aortic regurgitation PHT measures 554 msec. Pulmonic Valve: The pulmonic valve was normal in structure. Pulmonic valve regurgitation is mild to moderate. Aorta: Aortic dilatation noted. There is mild dilatation of the aortic root, measuring 42 mm. Venous: The inferior vena cava is normal in size with greater than 50% respiratory variability, suggesting right atrial pressure  of 3 mmHg. IAS/Shunts: No atrial level shunt detected by color flow Doppler.  LEFT VENTRICLE PLAX 2D LVIDd:         5.20 cm LVIDs:         3.70 cm LV PW:         1.30 cm LV IVS:        1.40 cm LVOT diam:     1.80 cm LV SV:         46 LV SV Index:   22 LVOT Area:     2.54 cm  RIGHT VENTRICLE             IVC RV S prime:     13.45 cm/s  IVC diam: 1.60 cm TAPSE (M-mode): 1.9 cm LEFT ATRIUM              Index        RIGHT ATRIUM           Index LA diam:        5.50 cm  2.67 cm/m   RA Area:     20.50 cm LA Vol (A2C):   144.0 ml 69.80 ml/m  RA Volume:   55.50 ml  26.90 ml/m LA Vol (A4C):   165.0 ml 79.98 ml/m LA Biplane Vol: 163.0 ml 79.01 ml/m  AORTIC VALVE LVOT Vmax:   95.10 cm/s LVOT Vmean:  59.750 cm/s LVOT VTI:    0.180 m AI PHT:      554 msec  AORTA Ao Root diam: 4.20 cm Ao Asc diam:  3.80 cm MR Peak grad: 32.0 mmHg   TRICUSPID VALVE MR Vmax:      283.00 cm/s TR Peak grad:   24.0 mmHg                           TR Vmax:        245.00 cm/s                            SHUNTS                           Systemic VTI:  0.18 m                           Systemic Diam: 1.80 cm Redell Cave MD Electronically signed by Redell Cave MD Signature Date/Time: 05/11/2024/12:22:45 PM    Final    LONG TERM MONITOR (3-14 DAYS) Result Date: 05/01/2024 Patch Wear Time:  14 days and 0 hours HR 54 - 163, average 91 bpm. 100% atrial flutter burden Occasional ventricular ectopy, 2.8%. No symptom trigger episodes. [facility] Cardiac Electrophysiology   Administration History     None  No data to display          No results found for: NITRICOXIDE      No data to display              Assessment & Plan:   Assessment and Plan Assessment & Plan Obstructive sleep apnea  -excellent compliance on nocturnal BiPAP.  Patient is having a few residual episodes predominantly central.  Will make some slight BiPAP changes with a download and 4 to 6 weeks. feels excellent and sleeps 8  hours nightly. Continue BiPAP therapy with a full face mask. . Maintain a healthy diet and active lifestyle. Ensure proper cleaning and maintenance of BiPAP equipment. An order for BiPAP supplies and pressure changes was sent to the home care company.  Insomnia  -controlled He is managed with temazepam  at bedtime. He reports sleeping 8 hours nightly. Continue temazepam  at bedtime.  Congestive heart failure/cardiomyopathy-appears compensated.  Recent echo shows a drop in EF in the setting of a flutter.  No evidence of volume overload on exam.  Rate appears to be controlled.  Continue follow-up with cardiology.  Plan  Patient Instructions  Continue on BIPAP At bedtime. Keep up good work.  Change BIPAP pressure Download in 6 weeks  Goal is to wear for at least 6 -8 hrs each night.  Do not drive if sleepy.  Work on healthy weight.  Healthy sleep regimen .  Follow up with Dr. Jude or Shekira Drummer in 6 months and As needed             Madelin Stank, NP 05/24/2024     [1]  Allergies Allergen Reactions   Sulfonamide Derivatives Other (See Comments)    Flu like symptoms    Zolpidem  Other (See Comments)    Excessive, prolonged sedation  [2]  Social History Tobacco Use  Smoking Status Former   Current packs/day: 0.00   Average packs/day: 1 pack/day for 10.0 years (10.0 ttl pk-yrs)   Types: Cigarettes   Start date: 06/07/1970   Quit date: 06/07/1980   Years since quitting: 43.9  Smokeless Tobacco Never

## 2024-05-25 ENCOUNTER — Other Ambulatory Visit: Payer: Self-pay | Admitting: Family Medicine

## 2024-05-25 NOTE — Telephone Encounter (Signed)
 Requesting: temazepam  and lorazepam ?  Contract:02/16/24 UDS:02/16/24 Last Visit: 02/16/24 Next Visit:08/20/24 Last Refill: see med list   Please Advise

## 2024-06-04 ENCOUNTER — Ambulatory Visit: Admitting: Podiatry

## 2024-06-04 ENCOUNTER — Encounter: Payer: Self-pay | Admitting: Podiatry

## 2024-06-04 DIAGNOSIS — M79674 Pain in right toe(s): Secondary | ICD-10-CM | POA: Diagnosis not present

## 2024-06-04 DIAGNOSIS — M79675 Pain in left toe(s): Secondary | ICD-10-CM | POA: Diagnosis not present

## 2024-06-04 DIAGNOSIS — B351 Tinea unguium: Secondary | ICD-10-CM

## 2024-06-05 ENCOUNTER — Ambulatory Visit: Admitting: Podiatry

## 2024-06-05 NOTE — Progress Notes (Signed)
 Subjective:   Patient ID: [name], male   DOB: 73 y.o.   MRN: [id number]   HPI Patient presents with elongated nails 1-5 both feet thick and painful   ROS      Objective:  Physical Exam  Neurovascular status intact thick yellow brittle nailbeds 1-5 both feet     Assessment:  Chronic nail infection with pain 1-5 both feet     Plan:  Debridement nailbeds 1-5 both feet Neutra genic bleeding reappoint routine care

## 2024-06-11 ENCOUNTER — Ambulatory Visit (HOSPITAL_COMMUNITY)
Admission: RE | Admit: 2024-06-11 | Discharge: 2024-06-11 | Disposition: A | Discharge status: Home / Self Care | Source: Ambulatory Visit | Attending: Physician Assistant | Admitting: Physician Assistant

## 2024-06-11 VITALS — BP 110/62 | HR 74 | Ht 71.0 in | Wt 190.6 lb

## 2024-06-11 DIAGNOSIS — I4891 Unspecified atrial fibrillation: Secondary | ICD-10-CM

## 2024-06-11 DIAGNOSIS — D6869 Other thrombophilia: Secondary | ICD-10-CM | POA: Diagnosis not present

## 2024-06-11 DIAGNOSIS — I484 Atypical atrial flutter: Secondary | ICD-10-CM

## 2024-06-11 DIAGNOSIS — I4819 Other persistent atrial fibrillation: Secondary | ICD-10-CM | POA: Diagnosis not present

## 2024-06-11 NOTE — Progress Notes (Signed)
 "   Primary Care Physician: Domenica Harlene LABOR, MD Primary Cardiologist: Jerel Balding, MD Electrophysiologist: Will Gladis Norton, MD  [facility]: Dr Cherrie Referring Physician: Dr Norton [name] Signa is a 74 y.o. male with a history of CHF, VHD, OSA, HTN, HLD, DM, atrial flutter, atrial fibrillation who presents for follow up in the [facility]. Admitted 05/16/19 for PVI with successful procedure. Post procedure he developed cardiogenic shock and respiratory failure. CCM consulted for intubation. Advanced HF team consulted. As he improved he was extubated. Treated with pressors and gradually weaned off. Was discharged home on 05/29/19. During his admission his dofetilide  was changed to amiodarone  2/2 renal insufficiency. Patient is s/p repeat afib ablation 03/10/20 with Dr Norton.   He had been maintaining SR until September 2025 when he noticed fluctuating heart rates on his pulse oximeter. ECG confirmed he was in atrial flutter and a monitor was ordered which showed 100% burden. He was seen by Dr Norton who check an echocardiogram which showed a decrease in LVEF to 35-40%. Patient is on Eliquis  for stroke prevention.    Patient presents today for follow up for atrial fibrillation and atrial flutter. He is rate controlled today and feels well. He denies any cardiac awareness of his arrhythmias. No bleeding issues on anticoagulation.   Today, he denies symptoms of palpitations, chest pain, shortness of breath, orthopnea, PND, lower extremity edema, dizziness, presyncope, syncope, bleeding, or neurologic sequela. The patient is tolerating medications without difficulties and is otherwise without complaint today.    Atrial Fibrillation Risk Factors:  he does have symptoms or diagnosis of sleep apnea. he does not have a history of rheumatic fever. he does not have a history of alcohol  use. The patient does not have a history of early familial atrial fibrillation  or other arrhythmias.  Atrial Fibrillation Management history:  Previous antiarrhythmic drugs: dofetilide , amiodarone  Previous cardioversions: 2016, 2019 x 2, 2020 Previous ablations: 05/16/19, 03/10/20 Anticoagulation history: Eliquis   ROS- All systems are reviewed and negative except as per the HPI above.  Past Medical History:  Diagnosis Date   AORTIC STENOSIS, MODERATE 12/05/2009   Arthritis    Atrial fibrillation (HCC)    Atrial fibrillation with RVR (HCC) 07/05/2017   Atrial flutter (HCC) 12/05/2009   Atypical chest pain 11/08/2011   BENIGN PROSTATIC HYPERTROPHY, HX OF 12/05/2009   CHEST PAIN-UNSPECIFIED 11/11/2009   CHF 12/05/2009   Chronic renal insufficiency 12/16/2016   Debility 04/18/2012   Diarrhea 12/16/2016   DYSPNEA 12/19/2009   Edema 04/18/2012   ERECTILE DYSFUNCTION 01/25/2007   FATIGUE, CHRONIC 11/11/2009   Gout 07/10/2012   Heart murmur    History of kidney stones    they passed (07/05/2017)   Hyperkalemia 04/18/2012   Hyperlipidemia    Hypertension    HYPERTENSION 01/25/2007   Hypoxia 05/05/2011   Insomnia 05/13/2016   INSOMNIA, HX OF 01/25/2007   Knee pain, bilateral 12/28/2010   Knee pain, right 12/28/2010   NEUROMA 01/05/2010   OBESITY NOS 01/25/2007   OSA on CPAP 04/06/2010   PERIPHERAL NEUROPATHY 01/05/2010   PULMONARY FUNCTION TESTS, ABNORMAL 02/02/2010   Sleep apnea    wears CPAP   Superficial thrombophlebitis of left leg 09/07/2011   TESTICULAR HYPOFUNCTION 01/05/2010   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 1 Inhaler 6   Alcohol  Swabs  (DROPSAFE ALCOHOL   PREP) 70 % PADS USE AS DIRECTED 300 each 3   allopurinol  (ZYLOPRIM ) 100 MG tablet Take 1 tablet by mouth once daily 90 tablet 0   apixaban  (ELIQUIS ) 5 MG TABS tablet Take 1 tablet by mouth twice daily 60 tablet 11   Blood Glucose Monitoring Suppl (ACCU-CHEK GUIDE ME) w/Device KIT USE TO TEST BLOOD SUGAR ONCE TO TWICE DAILY 1 kit 0   calcium  carbonate (TUMS - DOSED IN MG ELEMENTAL CALCIUM ) 500 MG chewable tablet Chew 500 mg by mouth daily as needed for indigestion or heartburn.     cetirizine  (ZYRTEC ) 10 MG tablet Take 1 tablet (10 mg total) by mouth daily. 30 tablet 11   Clobetasol  Prop Emollient Base (CLOBETASOL  PROPIONATE E) 0.05 % emollient cream Apply 1 application topically 2 (two) times daily as needed (insect bite). 60 g 1   colchicine  (COLCRYS ) 0.6 MG tablet Take one tablet for 4-6 days as needed for gout flare up. 15 tablet 3   dapagliflozin  propanediol (FARXIGA ) 10 MG TABS tablet Take 1 tablet (10 mg total) by mouth daily. 90 tablet 3   fish oil-omega-3 fatty acids  1000 MG capsule Take 1 g by mouth daily.     furosemide  (LASIX ) 80 MG tablet TAKE 1 TABLET BY MOUTH ONCE DAILY AS NEEDED. MAY TAKE AN ADDITIONAL 40MG  (1/2 TABLET) AS DIRECTED PER ALLEVIATE RESEARCH STUDY PRN PLAN. 135 tablet 3   gabapentin  (NEURONTIN ) 100 MG capsule Take 2 capsules (200 mg total) by mouth at bedtime. (Patient taking differently: Take 200 mg by mouth as needed.) 180 capsule 3   glucose blood (ACCU-CHEK GUIDE TEST) test strip USE TO TEST BLOOD SUGAR ONCE TO TWICE DAILY 200 each 3   hydrALAZINE  (APRESOLINE ) 10 MG tablet TAKE 1 TABLET BY MOUTH THREE TIMES DAILY 270 tablet 0   HYDROcodone -acetaminophen  (NORCO) 10-325 MG tablet Take 1 tablet by mouth every 8 (eight) hours as needed for moderate pain or severe pain. (Patient taking differently: Take 1 tablet by mouth as needed for moderate pain (pain score 4-6) or severe pain (pain score 7-10).) 90 tablet 0   hydrocortisone  2.5 % ointment Apply topically 2 (two) times  daily as needed. (Patient taking differently: Apply topically as needed.) 30 g 1   isosorbide  mononitrate (IMDUR ) 30 MG 24 hr tablet Take 1 tablet by mouth once daily 90 tablet 0   LORazepam  (ATIVAN ) 1 MG tablet TAKE 1 TABLET BY MOUTH EVERY 8 HOURS AS NEEDED FOR ANXIETY 45 tablet 0   losartan  (COZAAR ) 25 MG tablet Take 1/2 (one-half) tablet by mouth once daily 45 tablet 3   magnesium  oxide (MAG-OX) 400 MG tablet Take 1 tablet (400 mg total) by mouth 2 (two) times daily. 30 tablet 11   metoprolol  succinate (TOPROL -XL) 100 MG 24 hr tablet Take 1 tablet (100 mg total) by mouth daily. Take with or immediately following a meal. (Patient taking differently: Take 100 mg by mouth 2 (two) times daily. Take with or immediately following a meal.) 60 tablet 3   Multiple Vitamin (MULTIVITAMIN WITH MINERALS) TABS tablet Take 1 tablet by mouth daily.      potassium chloride  SA (KLOR-CON  M) 20 MEQ tablet TAKE 1 TABLET BY MOUTH ONCE DAILY AS DIRECTED PER  ALLEVIATE  RESEARCH  PROTOCOL 90  tablet 0   pravastatin  (PRAVACHOL ) 40 MG tablet Take 1 tablet by mouth once daily 90 tablet 3   Semaglutide ,0.25 or 0.5MG /DOS, 2 MG/3ML SOPN Inject 0.5 mg into the skin once a week.     tamsulosin  (FLOMAX ) 0.4 MG CAPS capsule Take 1 capsule by mouth once daily 30 capsule 0   temazepam  (RESTORIL ) 30 MG capsule Take 1 capsule by mouth at bedtime 30 capsule 0   tiZANidine  (ZANAFLEX ) 2 MG tablet Take 0.5-1 tablets (1-2 mg total) by mouth at bedtime as needed for muscle spasms. 30 tablet 1   VITAMIN D PO Take by mouth. (Patient taking differently: Take 1 tablet by mouth every morning.)     No current facility-administered medications for this encounter.    Physical Exam: BP 110/62   Pulse 74   Ht 5' 11 (1.803 m)   Wt 86.5 kg   BMI 26.58 kg/m   GEN: Well nourished, well developed in no acute distress CARDIAC: Irregularly irregular rate and rhythm, no murmurs, rubs, gallops RESPIRATORY:  Clear to auscultation without rales,  wheezing or rhonchi  ABDOMEN: Soft, non-tender, non-distended EXTREMITIES:  No edema; No deformity   Wt Readings from Last 3 Encounters:  06/11/24 86.5 kg  05/24/24 86.5 kg  05/15/24 87.1 kg     EKG Interpretation Date/Time:  Monday June 11 2024 10:30:34 EST Ventricular Rate:  74 PR Interval:    QRS Duration:  98 QT Interval:  364 QTC Calculation: 404 R Axis:   97  Text Interpretation: Atrial flutter with variable A-V block Rightward axis Pulmonary disease pattern Septal infarct (cited on or before 11-Apr-2024) T wave abnormality, consider lateral ischemia Abnormal ECG When compared with ECG of 11-Apr-2024 08:04, No significant change was found Confirmed by Nyana Haren ([id number]) on 06/11/2024 11:09:04 AM    Echo 05/11/24 demonstrated   1. Left ventricular ejection fraction, by estimation, is 35 to 40%. The  left ventricle has moderately decreased function. The left ventricle  demonstrates global hypokinesis. There is mild left ventricular  hypertrophy. Left ventricular diastolic parameters are indeterminate.   2. Right ventricular systolic function is low normal. The right  ventricular size is normal.   3. Left atrial size was severely dilated.   4. The mitral valve is normal in structure. Mild mitral valve  regurgitation.   5. The aortic valve is tricuspid. Aortic valve regurgitation is moderate.   6. Aortic dilatation noted. There is mild dilatation of the aortic root,  measuring 42 mm.   7. The inferior vena cava is normal in size with greater than 50%  respiratory variability, suggesting right atrial pressure of 3 mmHg.   Comparison(s): 07/18/2023 EF 50-55%.     CHA2DS2-VASc Score = 4  The patient's score is based upon: CHF History: 1 HTN History: 1 Diabetes History: 1 Stroke History: 0 Vascular Disease History: 0 Age Score: 1 Gender Score: 0       ASSESSMENT AND PLAN: Persistent Atrial Fibrillation/atrial flutter (ICD10:  I48.19) The patient's CHA2DS2-VASc  score is 4, indicating a 4.8% annual risk of stroke.   S/p afib ablation 05/16/19 and 03/10/20. Previously on dofetilide  (stopped due to renal function) and amiodarone  (stopped due to nausea). Patient in persistent atrial flutter. Would favor rhythm control given reduced EF. We discussed rhythm control options today including DCCV, dofetilide , or PFA. He does not want to make any changes currently as he is caring for a sick family member. Will have him return in a month to revisit rhythm control.  Continue  Eliquis  5 mg BID Continue Toprol  100 mg BID  Secondary Hypercoagulable State (ICD10:  D68.69) The patient is at significant risk for stroke/thromboembolism based upon his CHA2DS2-VASc Score of 4.  Continue Apixaban  (Eliquis ). No bleeding issues.   Chronic HFrEF EF 35-40%, suspect afib contributing.  GDMT per primary cardiology team Fluid status appears stable today  OSA  Encouraged nightly CPAP The importance of adequate treatment of sleep apnea was discussed today in order to improve our ability to maintain sinus rhythm long term.  HTN Stable on current regimen   Follow up in the AF clinic in one month.     [facility] [address] [place], [place] [address] [phone] "

## 2024-06-13 ENCOUNTER — Other Ambulatory Visit: Payer: Self-pay

## 2024-06-13 ENCOUNTER — Encounter: Payer: Self-pay | Admitting: Cardiovascular Disease

## 2024-06-13 MED ORDER — METOPROLOL SUCCINATE ER 100 MG PO TB24: 100.0000 mg | ORAL_TABLET | Freq: Every day | ORAL | 3 refills | Status: DC

## 2024-06-14 MED ORDER — METOPROLOL SUCCINATE ER 100 MG PO TB24: 100.0000 mg | ORAL_TABLET | Freq: Two times a day (BID) | ORAL | 3 refills | Status: AC

## 2024-06-14 NOTE — Telephone Encounter (Signed)
 Prescription has been updated and sent to pharmacy. Patient is aware.

## 2024-06-14 NOTE — Addendum Note (Signed)
 Addended by: MAGDALINE NEEDLE on: 06/14/2024 02:40 PM   Modules accepted: Orders

## 2024-06-14 NOTE — Telephone Encounter (Signed)
 Pt is requesting a callback regarding the increase on this medication on 05/02/24. He hasn't been able to get the proper refills because insurance is saying it's too soon. Pt stated it needs to be updated with the [facility] pharmacy and on his chart so that he can get his refills. Please advise

## 2024-06-18 ENCOUNTER — Other Ambulatory Visit: Payer: Self-pay

## 2024-06-18 MED ORDER — SEMAGLUTIDE(0.25 OR 0.5MG/DOS) 2 MG/3ML ~~LOC~~ SOPN: 0.5000 mg | PEN_INJECTOR | SUBCUTANEOUS | 3 refills | Status: AC

## 2024-06-20 ENCOUNTER — Other Ambulatory Visit: Payer: Self-pay | Admitting: Cardiovascular Disease

## 2024-06-20 ENCOUNTER — Other Ambulatory Visit: Payer: Self-pay | Admitting: Family Medicine

## 2024-06-20 DIAGNOSIS — Z006 Encounter for examination for normal comparison and control in clinical research program: Secondary | ICD-10-CM

## 2024-06-27 ENCOUNTER — Other Ambulatory Visit: Payer: Self-pay | Admitting: Family Medicine

## 2024-06-27 NOTE — Telephone Encounter (Signed)
 Requesting: lorazepam   Contract:02/16/24 UDS: 02/16/24 Last Visit: 02/16/24 Next Visit:08/20/24 Last Refill: 05/25/24 #45 and 0RF   Please Advise

## 2024-07-11 ENCOUNTER — Ambulatory Visit (HOSPITAL_COMMUNITY)

## 2024-07-12 ENCOUNTER — Ambulatory Visit (HOSPITAL_COMMUNITY): Admitting: Physician Assistant

## 2024-07-20 ENCOUNTER — Ambulatory Visit (HOSPITAL_COMMUNITY): Admitting: Physician Assistant

## 2024-08-20 ENCOUNTER — Ambulatory Visit: Admitting: Family Medicine

## 2024-09-12 ENCOUNTER — Ambulatory Visit: Admitting: Podiatry

## 2024-09-13 ENCOUNTER — Ambulatory Visit: Admitting: Internal Medicine

## 2024-11-19 ENCOUNTER — Ambulatory Visit: Admitting: Adult Health

## 2025-03-13 ENCOUNTER — Ambulatory Visit
